# Patient Record
Sex: Male | Born: 1955 | Race: Black or African American | Hispanic: No
Health system: Southern US, Community
[De-identification: ages and names within clinical notes are randomized; demographics above are authoritative.]

## PROBLEM LIST (undated history)

## (undated) ENCOUNTER — Emergency Department: Payer: Medicaid Other

## (undated) DIAGNOSIS — Z951 Presence of aortocoronary bypass graft: Secondary | ICD-10-CM

## (undated) DIAGNOSIS — I509 Heart failure, unspecified: Secondary | ICD-10-CM

## (undated) DIAGNOSIS — I10 Essential (primary) hypertension: Secondary | ICD-10-CM

## (undated) DIAGNOSIS — E119 Type 2 diabetes mellitus without complications: Secondary | ICD-10-CM

## (undated) DIAGNOSIS — I482 Chronic atrial fibrillation, unspecified: Secondary | ICD-10-CM

## (undated) DIAGNOSIS — I2510 Atherosclerotic heart disease of native coronary artery without angina pectoris: Secondary | ICD-10-CM

## (undated) HISTORY — PX: CORONARY ARTERY BYPASS GRAFT: SHX141

## (undated) HISTORY — PX: ANKLE ARTHROSCOPY W/ OPEN REPAIR: SHX1145

---

## 2004-04-02 ENCOUNTER — Other Ambulatory Visit: Payer: Self-pay

## 2004-04-02 ENCOUNTER — Inpatient Hospital Stay: Payer: Self-pay | Admitting: Cardiovascular Disease

## 2006-07-18 ENCOUNTER — Emergency Department: Payer: Self-pay | Admitting: Internal Medicine

## 2006-12-18 ENCOUNTER — Emergency Department: Payer: Self-pay | Admitting: Emergency Medicine

## 2007-02-15 ENCOUNTER — Other Ambulatory Visit: Payer: Self-pay

## 2007-02-15 ENCOUNTER — Emergency Department: Payer: Self-pay | Admitting: Emergency Medicine

## 2008-12-07 ENCOUNTER — Emergency Department: Payer: Self-pay | Admitting: Internal Medicine

## 2009-04-21 ENCOUNTER — Emergency Department: Payer: Self-pay | Admitting: Emergency Medicine

## 2009-06-28 ENCOUNTER — Emergency Department: Payer: Self-pay | Admitting: Emergency Medicine

## 2011-10-02 ENCOUNTER — Observation Stay: Payer: Self-pay | Admitting: Internal Medicine

## 2011-10-02 LAB — CK TOTAL AND CKMB (NOT AT ARMC): CK, Total: 255 U/L — ABNORMAL HIGH (ref 35–232)

## 2011-10-02 LAB — COMPREHENSIVE METABOLIC PANEL
Alkaline Phosphatase: 89 U/L (ref 50–136)
Anion Gap: 9 (ref 7–16)
Calcium, Total: 8.8 mg/dL (ref 8.5–10.1)
Chloride: 109 mmol/L — ABNORMAL HIGH (ref 98–107)
Co2: 23 mmol/L (ref 21–32)
EGFR (Non-African Amer.): >60
Glucose: 128 mg/dL — ABNORMAL HIGH (ref 65–99)
Potassium: 3.3 mmol/L — ABNORMAL LOW (ref 3.5–5.1)

## 2011-10-02 LAB — CBC
HGB: 13.4 g/dL (ref 13.0–18.0)
MCH: 31 pg (ref 26.0–34.0)
MCHC: 32.8 g/dL (ref 32.0–36.0)

## 2011-10-02 LAB — TROPONIN I: Troponin-I: 0.03 ng/mL

## 2011-10-03 LAB — BASIC METABOLIC PANEL
BUN: 8 mg/dL (ref 7–18)
EGFR (Non-African Amer.): >60
Osmolality: 289 (ref 275–301)

## 2011-10-03 LAB — CK TOTAL AND CKMB (NOT AT ARMC): CK, Total: 150 U/L (ref 35–232)

## 2011-10-03 LAB — LIPID PANEL
HDL Cholesterol: 34 mg/dL — ABNORMAL LOW (ref 40–60)
Ldl Cholesterol, Calc: 88 mg/dL (ref 0–100)
Triglycerides: 127 mg/dL (ref 0–200)

## 2013-07-09 ENCOUNTER — Ambulatory Visit: Payer: Self-pay

## 2014-10-06 NOTE — Discharge Summary (Signed)
PATIENT NAME:  Jeremy [name], Jeremy [name] MR#:  [id number] DATE OF BIRTH:  August 15, 1955  DATE OF ADMISSION:  10/02/2011 DATE OF DISCHARGE:  10/03/2011  ADMITTING DIAGNOSIS: Chest pain.  DISCHARGE DIAGNOSES:  1. Chest pain of unclear etiology at this time, cannot rule out unstable angina.  2. History of coronary artery disease with history of coronary artery bypass graft. No history of myocardial infarction. 3. History of hyperlipidemia.  4. Gastroesophageal reflux disease. 5. Hyperglycemia with Hemoglobin A1c of 6.2. 6. Obesity.   DISCHARGE CONDITION: Stable.   DISCHARGE MEDICATIONS:  1. The patient was advised to start aspirin 81 mg p.o. daily.  2. Imdur 30 mg p.o. daily.  3. Lipitor 10 mg p.o. daily.  4. Omeprazole 40 mg p.o. daily.   DIET: 2 grams salt, low fat, low calorie.   DISCHARGE INSTRUCTIONS/FOLLOWUP: The patient was advised to lose weight as the patient can develop diabetes. He is to followup with Dr. Corky Downs in one to three days after discharge.   ACTIVITY LIMITATIONS: As tolerated.  CONSULTANT: Corky Downs, MD  RADIOLOGIC STUDIES: Chest x-ray, PA and lateral, on 10/02/2011, showed atelectasis versus infiltrate in the region of the right middle lobe. Surveillance evaluation was recommended.   CT scan of the chest with IV contrast on 10/02/2011 revealed no evidence of pulmonary or arterial embolic disease. Findings which may represent component of pulmonary edema versus nonedematous interstitial infiltrate, likely atelectasis in the right middle lobe. A mild infiltrate cannot be completely excluded, according to the radiologist.   HISTORY OF PRESENT ILLNESS: The patient is a 59 year old African American male with history of coronary artery disease and history of coronary artery bypass grafting of one artery who presented to the hospital with complaints of chest pain. Please refer to Dr. Theora Gianotti admission 10/02/2011.   On arrival to the hospital, the patient's temperature was  96.2, pulse 64, respiration rate 18, blood pressure 184/88, and saturation was 98% on room air. Physical examination was unremarkable. The patient however had mildly swollen right ankle, but no other abnormalities.   LABS/STUDIES: On 10/02/2011 potassium was 3.3 and glucose 128, otherwise BMP was unremarkable. The patient's liver enzymes were normal. Cardiac enzymes first set showed CK total elevation to 255, otherwise MB fraction as well as troponin levels were within normal limits. CK as well as MB and troponin, second and third sets, were within normal limits. CBC was within normal limits. Coagulation panel was unremarkable.   EKG read by Dr. Boneta Lucks in the emergency room revealed normal sinus rhythm at 66 beats per minute. Some J-point elevation in septal leads, however, the patient had left ventricular hypertrophy. No acute ST-T changes were noted.   HOSPITAL COURSE: The patient was admitted to the hospital. His enzymes were cycled and cardiology consultation with Dr. Corky Downs was obtained. Dr. Juel Burrow saw the patient in consultation, on the day of discharge, 10/03/2011, and felt the patient is stable to be discharged home provided he ambulates in the hospital and he does not have any recurrence of his chest pain. He will be discharged on aspirin as well as Imdur. He is to start Lipitor. He is to follow up with Dr. Juel Burrow in the next few days after discharge for outpatient stress test.   In regards to hyperlipidemia, the patient was to be started on Lipitor.   The patient was complaining of some gastroesophageal reflux disease, so he is going to be started on omeprazole.   The patient was advised to try to lose weight. His Hemoglobin  A1c was checked because of hyperglycemia which was noted on two fasting specimens and Hemoglobin A1c was found to be 6.2. The patient was recommended to lose weight, as mentioned above, and help to improve his blood pressure as well as his glucose levels. The  patient's lipid panel was checked. The patient's LDL was found to be 88, triglyceride level was 127, cholesterol level was 147, and HDL was low at 34.   The patient's blood pressure was looked at all his stay in the hospital time. Initially on arrival to the hospital, the patient's blood pressure was markedly elevated. However, that improved with conservative management alone. It is recommended to follow the patient's blood pressure readings and initiate the patient on blood pressure medications if needed. Meanwhile the patient is to continue Imdur for angina symptoms. He is to follow up with Dr. Juel BurrowMasoud for further recommendations.   TIME SPENT: 40 minutes. ____________________________ Katharina Caperima Long Brimage, MD rv:slb D: 10/03/2011 13:21:56 ET T: 10/04/2011 10:02:51 ET JOB#: [id number]  cc: Katharina Caperima Reese Stockman, MD, <Dictator> Corky DownsJaved Masoud, MD Sharonna Vinje MD ELECTRONICALLY SIGNED 10/05/2011 20:07

## 2014-10-06 NOTE — H&P (Signed)
PATIENT NAME:  CHAIS, FEHRINGER MR#:  [id number] DATE OF BIRTH:  04/22/1956  DATE OF ADMISSION:  10/02/2011  CHIEF COMPLAINT: Chest pain.   HISTORY OF PRESENT ILLNESS: Mr. Parson woke up this morning at 6:30 with a burning/pulling pain in his right chest going down into his right arm. Typically when he gets this type of pain he takes a few TUMS and walks around and it improves. This morning the pain was worse than usual, he rated it an 8/10, and was not relieved with TUMS. He did not have any diaphoresis, nausea, palpitations, presyncope, or syncope. He did say he ate a very large meal last night prior to going to bed. He denies any sour taste in the back of his throat but he usually does have that symptom when he has reflux. Eventually the pain lasted so long that he became worried about his heart and came to the Emergency Department. The pain lasted several hours. He was given 365 mg of aspirin en route to the Emergency Department and the pain was finally decreased down to 1/10 after some IV Protonix.   REVIEW OF SYSTEMS: He denies any fever, weakness, or fatigue. No weight loss or weight gain. No diplopia, blurry vision, eye pain, or floaters. No rhinorrhea, epistaxis, or nasal discharge. No tinnitus or hearing changes. No dysphagia or odynophagia. No wheezing, cough, or dyspnea. No pleuritic chest pain. No tachycardia or palpitations. No orthopnea or PND. No nausea, vomiting, diarrhea, or constipation. No melena, hematochezia, or hematemesis. No dysuria, hematuria, nocturia, or incontinence. No joint pain or joint stiffness. No redness or swelling in any joints though he does have chronic right ankle pain and a small amount of edema there. No rashes, wounds, pruritus, or other skin changes. No headaches, weakness, numbness or tingling. No heat or cold intolerance. No polyuria or polydipsia. No easy bruising, easy bleeding, anemia, or swollen glands.   PAST MEDICAL HISTORY:  1. Coronary artery bypass  grafting in 2005 of a single vessel left main disease. A cardiac catheterization was done here at [facility] and revealed a 90% lesion from the origin of the vessel down through most of the vessel. He is right dominant in terms of blood supply. At that time he was diagnosed with hypertension and hyperlipidemia, though he has been off of all of his medications for just under a year.  2. He does have chronic right ankle pain status post major right ankle surgery.   Primary care doctor, whom he has not seen in almost a year, was at [facility].   FAMILY HISTORY: Significant for father who died of a massive MI at the age of 49. Sister with diabetes. Nephew with diabetes.   SOCIAL HISTORY: He is not working secondary to right ankle pain. He smokes about three cigarettes per day. He drinks one 40-ounce of malt liquor per week. He lives with his girlfriend.   CODE STATUS: He is a FULL CODE.   ALLERGIES: He has no known drug allergies.   MEDICATIONS: He is not taking any medications and does not recall what medications he is supposed to be on.   PHYSICAL EXAMINATION:   VITAL SIGNS: On presentation, temperature 96.2, heart rate 64, respiratory rate 18, blood pressure 184/88, oxygen sat is 98% on room air.   GENERAL: He is well developed, well nourished in no acute distress.   HEENT: Conjunctivae and lids are without erythema or pallor. Pupils are equal, round, and reactive to light and accommodation. Inspection of the ears  and nose without lesions or masses. Lips, teeth, and gums are without lesions or masses. He has poor dentition. Hearing is intact to whispered voice bilaterally. Nasal mucosa, septum, and turbinates are without lesions or masses. Oropharynx is clear. Oral mucosa is moist. Posterior pharynx is free of exudate.   NECK: Supple. There are no masses. Thyroid is not enlarged. There are no nodules.   LUNGS: He is not in respiratory distress. There is no use of accessory muscles or  intercostal retractions. Lungs are clear throughout. There is no wheezing, rales, or rhonchi.   ABDOMEN: Soft, nontender, nondistended. Liver and spleen are not enlarged. He has normoactive bowel sounds.   LYMPH: No cervical or axillary lymphadenopathy.   EXTREMITIES: He has no clubbing or cyanosis. He does have a slightly tender swollen right ankle. No erythema or warmth. It appears postsurgical.   SKIN: No rashes or lesions. Palpation of skin reveals no induration or nodules.   NEUROLOGIC: Cranial nerves II through XII are grossly intact. Deep tendon reflexes 2+ and equal bilaterally. Judgment and insight are intact. The patient is oriented to person, place, and time.   LABORATORY, DIAGNOSTIC, AND RADIOLOGICAL DATA: White blood cell count 8.2, hematocrit 40.8, platelets 262, sodium 143, potassium 3.3, chloride 109, bicarb 23, BUN 10, creatinine 1, glucose 128. CK 255. MB 0.8. Troponin is less than 0.02. INR 0.9.   EKG reveals normal sinus rhythm with a rate of 66. There is some J-point elevation in the septal leads. He appears to have LVH BI voltage. No other acute ST or T wave abnormalities.   Chest x-ray reveals right middle lobe infiltrate versus atelectasis.  ASSESSMENT AND PLAN: This is a 59 year old man with a history of single vessel coronary artery bypass graft for left main disease who presents with atypical chest pain.  1. Chest pain. Will trend cardiac enzymes and optimize his blood pressure. I started him on aspirin 81 mg. I ordered a Cardiology consult since he does not have follow-up and will likely need an outpatient Myoview. Will check fasting lipids and an A1c in the a.m. given his strong family history of diabetes. He is virtually chest pain free at this point. Etiology of his chest pain includes gastroesophageal reflux disease and pulmonary embolism.  2. Hypertension. Will use labetalol for now since he is getting IV contrast. Will avoid ACE inhibitors and diuretics. Would  likely benefit from an ARB/HCTZ combo.  3. Hypokalemia. Will replete and recheck in the morning.  4. Right middle lobe atelectasis versus infiltrate. Given his right leg chronic pain and immobility and right-sided chest pain, will get a CT with PE protocol to rule out pulmonary embolism and further characterize that area of uncertainty.  5. Gastroesophageal reflux disease. B.i.d. PPI. 6. DVT prophylaxis is being given with TEDs and SCDs pending results of the CT scan.   TOTAL TIME SPENT ON CARE AND COORDINATION: 50 minutes.   ____________________________ Lise AuerJennifer L. Manson PasseyBrown, MD jlb:drc D: 10/02/2011 13:12:16 ET T: 10/02/2011 13:52:25 ET JOB#: [id number] cc: Victorino DikeJennifer L. Manson PasseyBrown, MD, <Dictator>  Eber HongJENNIFER L BROWN MD ELECTRONICALLY SIGNED 10/02/2011 21:48

## 2014-10-06 NOTE — Consult Note (Signed)
PATIENT NAME:  Jeremy BottomROWELL, Nymir MR#:  [id number] DATE OF BIRTH:  11-29-1955  DATE OF CONSULTATION:  10/03/2011  REFERRING PHYSICIAN:   CONSULTING PHYSICIAN:  Corky DownsJaved Sashay Felling, MD  HISTORY OF PRESENT ILLNESS: Jeremy BottomMarshall Niblack was admitted in the Evaluation Suite with chest pain, burning pulling type, radiating to the right arm. In the past his [facility] and antacids but this time it lasted for 1-1/2 hours. He said he became a little bit sweaty. No history of nausea or vomiting. He came to the hospital. Serial CPK isoenzymes and EKG ruled out acute myocardial infarction. EKG did not show any acute changes. The patient is known to have left main coronary artery disease and has a history of bypass surgery in 2005. He is not taking any cholesterol medication and did not have any follow-up since then with Cardiology.   PAST SURGICAL HISTORY: History of right ankle surgery.   PAST MEDICAL HISTORY:  1. Hypertension. 2. Dyslipidemia.   FAMILY HISTORY: Father died of massive heart attack at age 255.   SOCIAL HISTORY: The patient works taking care of landscaping. He drinks socially once a week.   PHYSICAL EXAMINATION:   GENERAL: The patient is a well nourished male.   VITAL SIGNS: Blood pressure is found to be 150/80.   HEENT: Head normocephalic. Pupils reactive. Sclerae nonicteric. Tongue is moist, papillated.   NECK: Supple. Jugular venous pressure is not elevated. Carotid upstroke 2+ without any bruit. There is no goiter. No lymphadenopathy. Trachea is central.   CHEST: Clear to auscultation, resonant on percussion.   ABDOMEN: Soft, nontender. Liver and spleen are not enlarged. There is no pedal edema. Right ankle has limited tenderness.   NEUROLOGIC: Normal.  LAB DATA: Hematocrit 40.8. CPKs are normal. Troponin is normal. EKG does not show any acute changes.   IMPRESSION:  1. Angina pectoris.  2. Coronary artery disease.   PLAN: The patient was started on his simvastatin,  aspirin, and Imdur. In addition, he will see me in my office and will do a stress test on him. He was advised to lose weight, walk on a daily basis, and follow a low salt diet. The patient will be discharged on the above medications. He will see me in my office in one week for a stress test.   ____________________________ Corky DownsJaved Kaylub Detienne, MD jm:drc D: 10/03/2011 12:16:32 ET T: 10/03/2011 12:34:05 ET JOB#: [id number]  cc: Corky DownsJaved Josceline Chenard, MD, <Dictator> Corky DownsJAVED Agamjot Kilgallon MD ELECTRONICALLY SIGNED 10/06/2011 17:48

## 2015-02-03 ENCOUNTER — Emergency Department
Admission: EM | Admit: 2015-02-03 | Discharge: 2015-02-03 | Disposition: A | Discharge status: Home / Self Care | Payer: Medicaid Other | Attending: Emergency Medicine | Admitting: Emergency Medicine

## 2015-02-03 ENCOUNTER — Encounter: Payer: Self-pay | Admitting: Emergency Medicine

## 2015-02-03 DIAGNOSIS — H9201 Otalgia, right ear: Secondary | ICD-10-CM | POA: Insufficient documentation

## 2015-02-03 DIAGNOSIS — Z720 Tobacco use: Secondary | ICD-10-CM | POA: Insufficient documentation

## 2015-02-03 DIAGNOSIS — G44209 Tension-type headache, unspecified, not intractable: Secondary | ICD-10-CM | POA: Diagnosis present

## 2015-02-03 DIAGNOSIS — R21 Rash and other nonspecific skin eruption: Secondary | ICD-10-CM | POA: Diagnosis not present

## 2015-02-03 HISTORY — DX: Presence of aortocoronary bypass graft: Z95.1

## 2015-02-03 HISTORY — DX: Atherosclerotic heart disease of native coronary artery without angina pectoris: I25.10

## 2015-02-03 MED ORDER — AMOXICILLIN 500 MG PO TABS: 500.0000 mg | ORAL_TABLET | Freq: Two times a day (BID) | ORAL | Status: DC

## 2015-02-03 MED ORDER — TRAMADOL HCL 50 MG PO TABS: 50.0000 mg | ORAL_TABLET | Freq: Four times a day (QID) | ORAL | Status: DC | PRN

## 2015-02-03 NOTE — ED Notes (Signed)
Pt discharged home after verbalizing understanding of discharge instructions; nad noted. 

## 2015-02-03 NOTE — ED Notes (Signed)
Pt reports pain and tingling in his neck on the right side, as well as a rash on his right ankle. Pt alert & oriented with warm, dry skin.

## 2015-02-03 NOTE — Discharge Instructions (Signed)
Rash A rash is a change in the color or feel of your skin. There are many different types of rashes. You may have other problems along with your rash. HOME CARE  Avoid the thing that caused your rash.  Do not scratch your rash.  You may take cools baths to help stop itching.  Only take medicines as told by your doctor.  Keep all doctor visits as told. GET HELP RIGHT AWAY IF:   Your pain, puffiness (swelling), or redness gets worse.  You have a fever.  You have new or severe problems.  You have body aches, watery poop (diarrhea), or you throw up (vomit).  Your rash is not better after 3 days. MAKE SURE YOU:   Understand these instructions.  Will watch your condition.  Will get help right away if you are not doing well or get worse. Document Released: 11/17/2007 Document Revised: 08/23/2011 Document Reviewed: 03/15/2011 [facility] Patient Information 2015 [place], [place]. This information is not intended to replace advice given to you by your health care provider. Make sure you discuss any questions you have with your health care provider.  Otalgia Otalgia is pain in or around the ear. When the pain is from the ear itself it is called primary otalgia. Pain may also be coming from somewhere else, like the head and neck. This is called secondary otalgia.  CAUSES  Causes of primary otalgia include:  Middle ear infection.  It can also be caused by injury to the ear or infection of the ear canal (swimmer's ear). Swimmer's ear causes pain, swelling and often drainage from the ear canal. Causes of secondary otalgia include:  Sinus infections.  Allergies and colds that cause stuffiness of the nose and tubes that drain the ears (eustachian tubes).  Dental problems like cavities, gum infections or teething.  Sore Throat (tonsillitis and pharyngitis).  Swollen glands in the neck.  Infection of the bone behind the ear (mastoiditis).  TMJ discomfort (problems with the joint  between your jaw and your skull).  Other problems such as nerve disorders, circulation problems, heart disease and tumors of the head and neck can also cause symptoms of ear pain. This is rare. DIAGNOSIS  Evaluation, Diagnosis and Testing:  Examination by your medical caregiver is recommended to evaluate and diagnose the cause of otalgia.  Further testing or referral to a specialist may be indicated if the cause of the ear pain is not found and the symptom persists. TREATMENT   Your doctor may prescribe antibiotics if an ear infection is diagnosed.  Pain relievers and topical analgesics may be recommended.  It is important to take all medications as prescribed. HOME CARE INSTRUCTIONS   It may be helpful to sleep with the painful ear in the up position.  A warm compress over the painful ear may provide relief.  A soft diet and avoiding gum may help while ear pain is present. SEEK IMMEDIATE MEDICAL CARE IF:  You develop severe pain, a high fever, repeated vomiting or dehydration.  You develop extreme dizziness, headache, confusion, ringing in the ears (tinnitus) or hearing loss. Document Released: 07/08/2004 Document Revised: 08/23/2011 Document Reviewed: 04/09/2009 [facility] Patient Information 2015 [place], [place]. This information is not intended to replace advice given to you by your health care provider. Make sure you discuss any questions you have with your health care provider.

## 2015-02-03 NOTE — ED Provider Notes (Signed)
[facility] Emergency Department Provider Note  ____________________________________________  Time seen: On arrival  I have reviewed the triage vital signs and the nursing notes.   HISTORY  Chief Complaint Headache and Rash    HPI Jeremy [name] is a 59 y.o. male who presents with several complaints. Primarily complains of a rash and itching on his right ankle which developed several days ago. He also complains of an intermittent headache on the right side of his head which he has had for many years. And finally he complains of right ear discomfort and wax buildup. He denies fevers chills. No neck pain. No nausea no vomiting.    Past Medical History  Diagnosis Date  . S/P CABG x 1   . Coronary artery disease     There are no active problems to display for this patient.   No past surgical history on file.  Current Outpatient Rx  Name  Route  Sig  Dispense  Refill  . amoxicillin (AMOXIL) 500 MG tablet   Oral   Take 1 tablet (500 mg total) by mouth 2 (two) times daily.   14 tablet   0   . traMADol (ULTRAM) 50 MG tablet   Oral   Take 1 tablet (50 mg total) by mouth every 6 (six) hours as needed.   20 tablet   0     Allergies Review of patient's allergies indicates no known allergies.  No family history on file.  Social History Social History  Substance Use Topics  . Smoking status: Current Every Day Smoker  . Smokeless tobacco: None  . Alcohol Use: 0.6 oz/week    1 Glasses of wine per week    Review of Systems  Constitutional: Negative for fever. Eyes: Negative for visual changes. ENT: Negative for sore throat Genitourinary: Negative for dysuria. Musculoskeletal: Negative for back pain. Skin: Positive for rash Neurological: Negative for focal weakness   ____________________________________________   PHYSICAL EXAM:  VITAL SIGNS: ED Triage Vitals  Enc Vitals Group     BP 02/03/15 1110 147/81 mmHg     Pulse Rate  02/03/15 1110 66     Resp 02/03/15 1110 20     Temp 02/03/15 1110 98.3 F (36.8 C)     Temp Source 02/03/15 1110 Oral     SpO2 02/03/15 1110 94 %     Weight 02/03/15 1110 260 lb (117.935 kg)     Height 02/03/15 1110 5\' 11"  (1.803 m)     Head Cir --      Peak Flow --      Pain Score 02/03/15 1119 3     Pain Loc --      Pain Edu? --      Excl. in GC? --      Constitutional: Alert and oriented. Well appearing and in no distress. Eyes: Conjunctivae are normal.  ENT   Head: Normocephalic and atraumatic.   Mouth/Throat: Mucous membranes are moist. Ears: Cerumen noted but no impaction bilaterally. Right tympanic membrane is dull with possibly some fluid behind it. The pinna is nontender to palpation. No evidence of mastoiditis Cardiovascular: Normal rate, regular rhythm.  Respiratory: Normal respiratory effort without tachypnea nor retractions.  Gastrointestinal: Soft and non-tender in all quadrants. No distention. There is no CVA tenderness. Musculoskeletal: Nontender with normal range of motion in all extremities. Neurologic:  Normal speech and language. No gross focal neurologic deficits are appreciated. Skin:  Skin is warm, dry and intact. Patient with likely insect bites to  the right ankle, no evidence of infection Psychiatric: Mood and affect are normal. Patient exhibits appropriate insight and judgment.  ____________________________________________    LABS (pertinent positives/negatives)  Labs Reviewed - No data to display  ____________________________________________     ____________________________________________    RADIOLOGY I have personally reviewed any xrays that were ordered on this patient: None  ____________________________________________   PROCEDURES  Procedure(s) performed: none   ____________________________________________   INITIAL IMPRESSION / ASSESSMENT AND PLAN / ED COURSE  Pertinent labs & imaging results that were available  during my care of the patient were reviewed by me and considered in my medical decision making (see chart for details).  Patient well-appearing and in no distress. Suspect insect bites or causing itching to the right ankle. He may have a slight ear infection on the right we will prescribe antibiotics and have him follow-up with his primary care physician  ____________________________________________   FINAL CLINICAL IMPRESSION(S) / ED DIAGNOSES  Final diagnoses:  Otalgia of right ear  Tension headache  Rash     Jene Every, MD 02/03/15 [phone]

## 2015-02-03 NOTE — ED Notes (Signed)
PAIN AND TINGLING OVER RIGHT EAR GOING BACK TWORD NECK FOR 1 WEEK.  ALSO HAS RASH ON RIGHT ANKLE.

## 2015-02-25 ENCOUNTER — Encounter: Payer: Self-pay | Admitting: *Deleted

## 2015-02-25 DIAGNOSIS — Z720 Tobacco use: Secondary | ICD-10-CM | POA: Insufficient documentation

## 2015-02-25 DIAGNOSIS — Z7951 Long term (current) use of inhaled steroids: Secondary | ICD-10-CM | POA: Insufficient documentation

## 2015-02-25 DIAGNOSIS — S199XXA Unspecified injury of neck, initial encounter: Secondary | ICD-10-CM | POA: Diagnosis present

## 2015-02-25 DIAGNOSIS — Z792 Long term (current) use of antibiotics: Secondary | ICD-10-CM | POA: Insufficient documentation

## 2015-02-25 DIAGNOSIS — Y92410 Unspecified street and highway as the place of occurrence of the external cause: Secondary | ICD-10-CM | POA: Insufficient documentation

## 2015-02-25 DIAGNOSIS — Y9389 Activity, other specified: Secondary | ICD-10-CM | POA: Insufficient documentation

## 2015-02-25 DIAGNOSIS — L509 Urticaria, unspecified: Secondary | ICD-10-CM | POA: Diagnosis not present

## 2015-02-25 DIAGNOSIS — S161XXA Strain of muscle, fascia and tendon at neck level, initial encounter: Secondary | ICD-10-CM | POA: Diagnosis not present

## 2015-02-25 DIAGNOSIS — Y998 Other external cause status: Secondary | ICD-10-CM | POA: Diagnosis not present

## 2015-02-25 DIAGNOSIS — S0993XA Unspecified injury of face, initial encounter: Secondary | ICD-10-CM | POA: Insufficient documentation
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
## 2015-02-26 ENCOUNTER — Emergency Department: Payer: No Typology Code available for payment source

## 2015-02-26 ENCOUNTER — Emergency Department
Admission: EM | Admit: 2015-02-26 | Discharge: 2015-02-26 | Disposition: A | Discharge status: Home / Self Care | Payer: No Typology Code available for payment source | Attending: Emergency Medicine | Admitting: Emergency Medicine

## 2015-02-26 DIAGNOSIS — L509 Urticaria, unspecified: Secondary | ICD-10-CM

## 2015-02-26 DIAGNOSIS — S161XXA Strain of muscle, fascia and tendon at neck level, initial encounter: Secondary | ICD-10-CM

## 2015-02-26 DIAGNOSIS — R220 Localized swelling, mass and lump, head: Secondary | ICD-10-CM

## 2015-02-26 MED ORDER — PREDNISONE 20 MG PO TABS: 60.0000 mg | ORAL_TABLET | Freq: Every day | ORAL | Status: DC

## 2015-02-26 MED ORDER — FAMOTIDINE 40 MG PO TABS: 40.0000 mg | ORAL_TABLET | Freq: Every evening | ORAL | Status: DC

## 2015-02-26 MED ORDER — PREDNISONE 20 MG PO TABS
60.0000 mg | ORAL_TABLET | Freq: Once | ORAL | Status: AC
  Administered 2015-02-26: 60 mg via ORAL
  Filled 2015-02-26: qty 3

## 2015-02-26 MED ORDER — PREDNISONE 20 MG PO TABS
60.0000 mg | ORAL_TABLET | Freq: Every day | ORAL | Status: DC
Start: 2015-02-26 — End: 2015-02-28

## 2015-02-26 MED ORDER — DIPHENHYDRAMINE HCL 25 MG PO CAPS: 25.0000 mg | ORAL_CAPSULE | ORAL | Status: DC | PRN

## 2015-02-26 MED ORDER — TRAMADOL HCL 50 MG PO TABS
50.0000 mg | ORAL_TABLET | Freq: Four times a day (QID) | ORAL | Status: DC | PRN
Start: 2015-02-26 — End: 2015-02-28

## 2015-02-26 MED ORDER — OXYCODONE-ACETAMINOPHEN 5-325 MG PO TABS
1.0000 | ORAL_TABLET | Freq: Once | ORAL | Status: AC
  Administered 2015-02-26: 1 via ORAL
  Filled 2015-02-26: qty 1

## 2015-02-26 MED ORDER — FAMOTIDINE 20 MG PO TABS
40.0000 mg | ORAL_TABLET | Freq: Once | ORAL | Status: AC
  Administered 2015-02-26: 40 mg via ORAL
  Filled 2015-02-26: qty 2

## 2015-02-26 MED ORDER — DIPHENHYDRAMINE HCL 25 MG PO CAPS
25.0000 mg | ORAL_CAPSULE | Freq: Once | ORAL | Status: AC
  Administered 2015-02-26: 25 mg via ORAL
  Filled 2015-02-26: qty 1
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
## 2015-02-26 NOTE — ED Notes (Signed)

## 2015-02-28 ENCOUNTER — Emergency Department
Admission: EM | Admit: 2015-02-28 | Discharge: 2015-02-28 | Disposition: A | Discharge status: Home / Self Care | Payer: Medicaid Other | Attending: Emergency Medicine | Admitting: Emergency Medicine

## 2015-02-28 ENCOUNTER — Encounter: Payer: Self-pay | Admitting: Emergency Medicine

## 2015-02-28 DIAGNOSIS — Z720 Tobacco use: Secondary | ICD-10-CM | POA: Diagnosis not present

## 2015-02-28 DIAGNOSIS — T360X5A Adverse effect of penicillins, initial encounter: Secondary | ICD-10-CM | POA: Diagnosis not present

## 2015-02-28 DIAGNOSIS — R220 Localized swelling, mass and lump, head: Secondary | ICD-10-CM | POA: Diagnosis present

## 2015-02-28 DIAGNOSIS — T783XXA Angioneurotic edema, initial encounter: Secondary | ICD-10-CM | POA: Diagnosis not present

## 2015-02-28 DIAGNOSIS — I10 Essential (primary) hypertension: Secondary | ICD-10-CM | POA: Insufficient documentation

## 2015-02-28 HISTORY — DX: Essential (primary) hypertension: I10

## 2015-02-28 LAB — COMPREHENSIVE METABOLIC PANEL
ALBUMIN: 3.7 g/dL (ref 3.5–5.0)
ALT: 13 U/L — ABNORMAL LOW (ref 17–63)
AST: 18 U/L (ref 15–41)
Alkaline Phosphatase: 89 U/L (ref 38–126)
Anion gap: 5 (ref 5–15)
BILIRUBIN TOTAL: 0.4 mg/dL (ref 0.3–1.2)
BUN: 10 mg/dL (ref 6–20)
CHLORIDE: 111 mmol/L (ref 101–111)
CO2: 25 mmol/L (ref 22–32)
Calcium: 8.7 mg/dL — ABNORMAL LOW (ref 8.9–10.3)
Creatinine, Ser: 1.15 mg/dL (ref 0.61–1.24)
GFR calc Af Amer: >60 mL/min (ref >60)
GFR calc non Af Amer: >60 mL/min (ref >60)
GLUCOSE: 105 mg/dL — AB (ref 65–99)
POTASSIUM: 3.7 mmol/L (ref 3.5–5.1)
Sodium: 141 mmol/L (ref 135–145)
Total Protein: 6.6 g/dL (ref 6.5–8.1)

## 2015-02-28 LAB — CBC WITH DIFFERENTIAL/PLATELET
BASOS ABS: 0.1 10*3/uL (ref 0–0.1)
BASOS PCT: 1 %
Eosinophils Absolute: 0.4 10*3/uL (ref 0–0.7)
Eosinophils Relative: 8 %
HEMATOCRIT: 45.7 % (ref 40.0–52.0)
Hemoglobin: 15.1 g/dL (ref 13.0–18.0)
Lymphocytes Relative: 35 %
Lymphs Abs: 2 10*3/uL (ref 1.0–3.6)
MCH: 31.8 pg (ref 26.0–34.0)
MCHC: 33.1 g/dL (ref 32.0–36.0)
MCV: 96 fL (ref 80.0–100.0)
MONO ABS: 0.6 10*3/uL (ref 0.2–1.0)
Monocytes Relative: 11 %
NEUTROS ABS: 2.5 10*3/uL (ref 1.4–6.5)
NEUTROS PCT: 45 %
Platelets: 257 10*3/uL (ref 150–440)
RBC: 4.76 MIL/uL (ref 4.40–5.90)
RDW: 14.1 % (ref 11.5–14.5)
WBC: 5.6 10*3/uL (ref 3.8–10.6)

## 2015-02-28 MED ORDER — DIPHENHYDRAMINE HCL 50 MG/ML IJ SOLN
50.0000 mg | Freq: Once | INTRAMUSCULAR | Status: AC
  Administered 2015-02-28: 50 mg via INTRAVENOUS
  Filled 2015-02-28: qty 1

## 2015-02-28 MED ORDER — DIPHENHYDRAMINE HCL 50 MG/ML IJ SOLN
50.0000 mg | Freq: Once | INTRAMUSCULAR | Status: DC
  Filled 2015-02-28: qty 1

## 2015-02-28 MED ORDER — METHYLPREDNISOLONE SODIUM SUCC 125 MG IJ SOLR
125.0000 mg | Freq: Once | INTRAMUSCULAR | Status: AC
  Administered 2015-02-28: 125 mg via INTRAVENOUS
  Filled 2015-02-28: qty 2

## 2015-02-28 MED ORDER — FAMOTIDINE IN NACL 20-0.9 MG/50ML-% IV SOLN
20.0000 mg | Freq: Once | INTRAVENOUS | Status: AC
  Administered 2015-02-28: 20 mg via INTRAVENOUS
  Filled 2015-02-28: qty 50

## 2015-02-28 NOTE — Discharge Instructions (Signed)
Angioedema °Angioedema is a sudden swelling of tissues, often of the skin. It can occur on the face or genitals or in the abdomen or other body parts. The swelling usually develops over a short period and gets better in 24 to 48 hours. It often begins during the night and is found when the person wakes up. The person may also get red, itchy patches of skin (hives). Angioedema can be dangerous if it involves swelling of the air passages.  °Depending on the cause, episodes of angioedema may only happen once, come back in unpredictable patterns, or repeat for several years and then gradually fade away.  °CAUSES  °Angioedema can be caused by an allergic reaction to various triggers. It can also result from nonallergic causes, including reactions to drugs, immune system disorders, viral infections, or an abnormal gene that is passed to you from your parents (hereditary). For some people with angioedema, the cause is unknown.  °Some things that can trigger angioedema include:  °· Foods.   °· Medicines, such as ACE inhibitors, ARBs, nonsteroidal anti-inflammatory agents, or estrogen.   °· Latex.   °· Animal saliva.   °· Insect stings.   °· Dyes used in X-rays.   °· Mild injury.   °· Dental work. °· Surgery. °· Stress.   °· Sudden changes in temperature.   °· Exercise. °SIGNS AND SYMPTOMS  °· Swelling of the skin. °· Hives. If these are present, there is also intense itching. °· Redness in the affected area.   °· Pain in the affected area. °· Swollen lips or tongue. °· Breathing problems. This may happen if the air passages swell. °· Wheezing. °If internal organs are involved, there may be:  °· Nausea.   °· Abdominal pain.   °· Vomiting.   °· Difficulty swallowing.   °· Difficulty passing urine. °DIAGNOSIS  °· Your health care provider will examine the affected area and take a medical and family history. °· Various tests may be done to help determine the cause. Tests may include: °¨ Allergy skin tests to see if the problem  is an allergic reaction.   °¨ Blood tests to check for hereditary angioedema.   °¨ Tests to check for underlying diseases that could cause the condition.   °· A review of your medicines, including over-the-counter medicines, may be done. °TREATMENT  °Treatment will depend on the cause of the angioedema. Possible treatments include:  °· Removal of anything that triggered the condition (such as stopping certain medicines).   °· Medicines to treat symptoms or prevent attacks. Medicines given may include:   °¨ Antihistamines.   °¨ Epinephrine injection.   °¨ Steroids.   °· Hospitalization may be required for severe attacks. If the air passages are affected, it can be an emergency. Tubes may need to be placed to keep the airway open. °HOME CARE INSTRUCTIONS  °· Take all medicines as directed by your health care provider. °· If you were given medicines for emergency allergy treatment, always carry them with you. °· Wear a medical bracelet as directed by your health care provider.   °· Avoid known triggers. °SEEK MEDICAL CARE IF:  °· You have repeat attacks of angioedema.   °· Your attacks are more frequent or more severe despite preventive measures.   °· You have hereditary angioedema and are considering having children. It is important to discuss with your health care provider the risks of passing the condition on to your children. °SEEK IMMEDIATE MEDICAL CARE IF:  °· You have severe swelling of the mouth, tongue, or lips. °· You have difficulty breathing.   °· You have difficulty swallowing.   °· You faint. °MAKE   SURE YOU: °· Understand these instructions. °· Will watch your condition. °· Will get help right away if you are not doing well or get worse. °Document Released: 08/09/2001 Document Revised: 10/15/2013 Document Reviewed: 01/22/2013 °[employer]® Patient Information ©2015 [employer]. This information is not intended to replace advice given to you by your health care provider. Make sure you discuss any questions  you have with your health care provider. ° °

## 2015-02-28 NOTE — ED Notes (Signed)
Pt to ED with facial & periorbital edema and rash on back of neck and behind ears.
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
## 2015-10-05 ENCOUNTER — Encounter: Payer: Self-pay | Admitting: *Deleted

## 2015-10-05 ENCOUNTER — Emergency Department
Admission: EM | Admit: 2015-10-05 | Discharge: 2015-10-05 | Disposition: A | Discharge status: Home / Self Care | Payer: Medicaid Other | Attending: Emergency Medicine | Admitting: Emergency Medicine

## 2015-10-05 DIAGNOSIS — I2510 Atherosclerotic heart disease of native coronary artery without angina pectoris: Secondary | ICD-10-CM | POA: Insufficient documentation

## 2015-10-05 DIAGNOSIS — I10 Essential (primary) hypertension: Secondary | ICD-10-CM | POA: Diagnosis not present

## 2015-10-05 DIAGNOSIS — B86 Scabies: Secondary | ICD-10-CM | POA: Diagnosis not present

## 2015-10-05 DIAGNOSIS — R21 Rash and other nonspecific skin eruption: Secondary | ICD-10-CM | POA: Diagnosis not present

## 2015-10-05 DIAGNOSIS — F17210 Nicotine dependence, cigarettes, uncomplicated: Secondary | ICD-10-CM | POA: Insufficient documentation

## 2015-10-05 DIAGNOSIS — Z951 Presence of aortocoronary bypass graft: Secondary | ICD-10-CM | POA: Diagnosis not present

## 2015-10-05 MED ORDER — PERMETHRIN 5 % EX CREA: TOPICAL_CREAM | CUTANEOUS | Status: DC

## 2015-10-05 MED ORDER — METHYLPREDNISOLONE SODIUM SUCC 125 MG IJ SOLR
80.0000 mg | Freq: Once | INTRAMUSCULAR | Status: AC
  Administered 2015-10-05: 80 mg via INTRAVENOUS
  Filled 2015-10-05: qty 2

## 2015-10-05 MED ORDER — HYDROXYZINE PAMOATE 25 MG PO CAPS: 25.0000 mg | ORAL_CAPSULE | Freq: Three times a day (TID) | ORAL | Status: DC | PRN

## 2015-10-05 MED ORDER — PREDNISONE 20 MG PO TABS: ORAL_TABLET | ORAL | Status: DC

## 2015-10-05 MED ORDER — DIPHENHYDRAMINE HCL 50 MG/ML IJ SOLN
25.0000 mg | Freq: Once | INTRAMUSCULAR | Status: AC
  Administered 2015-10-05: 25 mg via INTRAVENOUS
  Filled 2015-10-05: qty 1

## 2015-10-05 NOTE — ED Provider Notes (Signed)
[facility] Emergency Department Provider Note  ____________________________________________  Time seen: Approximately 1:52 PM  I have reviewed the triage vital signs and the nursing notes.   HISTORY  Chief Complaint Rash    HPI Temple Sporer is a 60 y.o. male , NAD, reports the emergency department with 1 month history of full body rash. States that he acquired this rash from his dog who has a similar rash.  Dog is an indoor dog but does go outside. Patient believes he has small bugs crawling and biting his head. Denies any other sensation anywhere else on his body. Has noted small clear pustules about his body which she scratches. The rash is intensely itchy. Has been taking Benadryl without alleviation. His wife accompanies him and she denies any similar rash. Denies fever, chills, body aches. Has not had any shortness breath, chest pain, wheezing. No abdominal pain, nausea, vomiting. No known environmental allergies.   Past Medical History  Diagnosis Date  . S/P CABG x 1   . Coronary artery disease   . Hypertension     There are no active problems to display for this patient.   Past Surgical History  Procedure Laterality Date  . Ankle arthroscopy w/ open repair      Current Outpatient Rx  Name  Route  Sig  Dispense  Refill  . hydrOXYzine (VISTARIL) 25 MG capsule   Oral   Take 1 capsule (25 mg total) by mouth 3 (three) times daily as needed.   30 capsule   0   . permethrin (ELIMITE) 5 % cream      Apply generously to external skin from the neck down to the soles of the feet prior to going to bed. Sleep in the solution and wash off 8 hours later. May reapply in 10 days if symptoms persist or return.   60 g   1   . predniSONE (DELTASONE) 20 MG tablet      Take 2 tablets by mouth, once daily, for 5 days   10 tablet   0     Allergies Amoxicillin  No family history on file.  Social History Social History  Substance Use Topics  .  Smoking status: Current Some Day Smoker -- 0.50 packs/day    Types: Cigarettes  . Smokeless tobacco: None  . Alcohol Use: 0.6 oz/week    1 Cans of beer per week     Review of Systems  Constitutional: No fever/chills, fatigue Eyes: No visual changes. No discharge, redness, discharge ENT: No sore throat, tongue swelling, throat swelling. Cardiovascular: No chest pain. Respiratory: No cough. No shortness of breath. No wheezing.  Gastrointestinal: No abdominal pain.  No nausea, vomiting.  Musculoskeletal: Negative for general myalgias.  Skin: Positive for diffuse, puritic rash. Some open sores due to itching.  Neurological: Negative for headaches, focal weakness or numbness. 10-point ROS otherwise negative.  ____________________________________________   PHYSICAL EXAM:  VITAL SIGNS: ED Triage Vitals  Enc Vitals Group     BP 10/05/15 1317 141/68 mmHg     Pulse Rate 10/05/15 1317 84     Resp 10/05/15 1317 18     Temp 10/05/15 1317 97.9 F (36.6 C)     Temp Source 10/05/15 1317 Oral     SpO2 10/05/15 1317 97 %     Weight 10/05/15 1317 260 lb (117.935 kg)     Height 10/05/15 1317  (1.803 m)     Head Cir --      Peak  Flow --      Pain Score --      Pain Loc --      Pain Edu? --      Excl. in GC? --      Constitutional: Alert and oriented. Well appearing and in no acute distress.  Head: Atraumatic. ENT:        Mouth/Throat: Mucous membranes are moist. No swelling noted.  Neck: No stridor. Supple with FROM Hematological/Lymphatic/Immunilogical: No cervical lymphadenopathy. Cardiovascular: Normal rate, regular rhythm. Normal S1 and S2.  Good peripheral circulation with 2+ pulses noted in bilateral lower extremities.  Respiratory: Normal respiratory effort without tachypnea or retractions. Lungs CTAB. Musculoskeletal: Diffuse swelling of bilateral lower extremities without edema or tenderness present.  Neurologic:  Normal speech and language. No gross focal neurologic  deficits are appreciated.  Skin:  Pruritic, erythematous tests, maculopapular rash diffusely across the patient's body from head to toe. Some areas of excoriation where the patient has been scratching. No crusting or oozing or weeping lesions are noted. No areas of induration or fluctuance. No evidence of tonsils or tracking marks from a possible infestation. Skin is warm, dry and intact.  Psychiatric: Mood and affect are normal. Speech and behavior are normal. Patient exhibits appropriate insight and judgement.   ____________________________________________   LABS  None ____________________________________________  EKG  None ____________________________________________  RADIOLOGY  None ____________________________________________    PROCEDURES  Procedure(s) performed: None    Medications  methylPREDNISolone sodium succinate (SOLU-MEDROL) 125 mg/2 mL injection 80 mg (80 mg Intravenous Given 10/05/15 1410)  diphenhydrAMINE (BENADRYL) injection 25 mg (25 mg Intravenous Given 10/05/15 1411)     ____________________________________________   INITIAL IMPRESSION / ASSESSMENT AND PLAN / ED COURSE  Patient's diagnosis is consistent with rash and nonspecific skin eruption with potential animal transmitted scabies. Patient will be discharged home with prescriptions for hydroxyzine, permethrin cream and prednisone to take as directed. Advised the patient that he should have his animal treated for potential mange as soon as possible. Home care instructions were given in regards to cleansing of clothing, bedding and other household items that may have been exposed. Patient is to follow up with [facility] if symptoms persist past this treatment course. Patient is given ED precautions to return to the ED for any worsening or new symptoms.      ____________________________________________  FINAL CLINICAL IMPRESSION(S) / ED DIAGNOSES  Final diagnoses:  Rash and nonspecific  skin eruption  Animal-transmitted scabies      NEW MEDICATIONS STARTED DURING THIS VISIT:  Discharge Medication List as of 10/05/2015  2:15 PM    START taking these medications   Details  hydrOXYzine (VISTARIL) 25 MG capsule Take 1 capsule (25 mg total) by mouth 3 (three) times daily as needed., Starting 10/05/2015, Until Discontinued, Print    permethrin (ELIMITE) 5 % cream Apply generously to external skin from the neck down to the soles of the feet prior to going to bed. Sleep in the solution and wash off 8 hours later. May reapply in 10 days if symptoms persist or return., Print    predniSONE (DELTASONE) 20 MG tablet Take 2 tablets by mouth, once daily, for 5 days, Print             Hope PigeonJami L Riot Barrick, PA-C 10/05/15 1453  Myrna Blazeravid Matthew Schaevitz, MD 10/05/15 1623

## 2015-10-05 NOTE — ED Notes (Signed)
Patient c/o rash on all extremities for one month.

## 2015-10-05 NOTE — ED Notes (Signed)
NAD noted at time of D/C. Pt denies questions or concerns. Pt ambulatory to the lobby at this time.  

## 2015-10-05 NOTE — Discharge Instructions (Signed)

## 2015-10-23 ENCOUNTER — Emergency Department
Admission: EM | Admit: 2015-10-23 | Discharge: 2015-10-23 | Disposition: A | Discharge status: Home / Self Care | Payer: Medicaid Other | Attending: Emergency Medicine | Admitting: Emergency Medicine

## 2015-10-23 ENCOUNTER — Encounter: Payer: Self-pay | Admitting: Emergency Medicine

## 2015-10-23 DIAGNOSIS — I10 Essential (primary) hypertension: Secondary | ICD-10-CM | POA: Diagnosis not present

## 2015-10-23 DIAGNOSIS — L209 Atopic dermatitis, unspecified: Secondary | ICD-10-CM | POA: Insufficient documentation

## 2015-10-23 DIAGNOSIS — F17210 Nicotine dependence, cigarettes, uncomplicated: Secondary | ICD-10-CM | POA: Diagnosis not present

## 2015-10-23 DIAGNOSIS — I2510 Atherosclerotic heart disease of native coronary artery without angina pectoris: Secondary | ICD-10-CM | POA: Insufficient documentation

## 2015-10-23 DIAGNOSIS — R21 Rash and other nonspecific skin eruption: Secondary | ICD-10-CM | POA: Diagnosis present

## 2015-10-23 DIAGNOSIS — B86 Scabies: Secondary | ICD-10-CM | POA: Diagnosis not present

## 2015-10-23 MED ORDER — PREDNISONE 10 MG PO TABS: ORAL_TABLET | ORAL | Status: DC

## 2015-10-23 MED ORDER — HYDROXYZINE PAMOATE 25 MG PO CAPS: 25.0000 mg | ORAL_CAPSULE | Freq: Three times a day (TID) | ORAL | Status: DC | PRN

## 2015-10-23 MED ORDER — PERMETHRIN 5 % EX CREA: 1.0000 "application " | TOPICAL_CREAM | Freq: Once | CUTANEOUS | Status: DC

## 2015-10-23 NOTE — Discharge Instructions (Signed)
Scabies, Adult Scabies is a skin condition that happens when very small insects get under the skin (infestation). This causes a rash and severe itchiness. Scabies can spread from person to person (is contagious). If you get scabies, it is common for others in your household to get scabies too. With proper treatment, symptoms usually go away in 2-4 weeks. Scabies usually does not cause lasting problems. CAUSES This condition is caused by mites (Sarcoptes scabiei, or human itch mites) that can only be seen with a microscope. The mites get into the top layer of skin and lay eggs. Scabies can spread from person to person through:  Close contact with a person who has scabies.  Contact with infested items, such as towels, bedding, or clothing. RISK FACTORS This condition is more likely to develop in:  People who live in nursing homes and other extended-care facilities.  People who have sexual contact with a partner who has scabies.  Young children who attend child care facilities.  People who care for others who are at increased risk for scabies. SYMPTOMS Symptoms of this condition may include:  Severe itchiness. This is often worse at night.  A rash that includes tiny red bumps or blisters. The rash commonly occurs on the wrist, elbow, armpit, fingers, waist, groin, or buttocks. Bumps may form a line (burrow) in some areas.  Skin irritation. This can include scaly patches or sores. DIAGNOSIS This condition is diagnosed with a physical exam. Your health care provider will look closely at your skin. In some cases, your health care provider may take a sample of your affected skin (skin scraping) and have it examined under a microscope. TREATMENT This condition may be treated with:  Medicated cream or lotion that kills the mites. This is spread on the entire body and left on for several hours. Usually, one treatment with medicated cream or lotion is enough to kill all of the mites. In severe  cases, the treatment may be repeated.  Medicated cream that relieves itching.  Medicines that help to relieve itching.  Medicines that kill the mites. This treatment is rarely used. HOME CARE INSTRUCTIONS Medicines  Take or apply over-the-counter and prescription medicines as told by your health care provider.  Apply medicated cream or lotion as told by your health care provider.  Do not wash off the medicated cream or lotion until the necessary amount of time has passed. Skin Care  Avoid scratching your affected skin.  Keep your fingernails closely trimmed to reduce injury from scratching.  Take cool baths or apply cool washcloths to help reduce itching. General Instructions  Clean all items that you recently had contact with, including bedding, clothing, and furniture. Do this on the same day that your treatment starts.  Use hot water when you wash items.  Place unwashable items into closed, airtight plastic bags for at least 3 days. The mites cannot live for more than 3 days away from human skin.  Vacuum furniture and mattresses that you use.  Make sure that other people who may have been infested are examined by a health care provider. These include members of your household and anyone who may have had contact with infested items.  Keep all follow-up visits as told by your health care provider. This is important. SEEK MEDICAL CARE IF:  You have itching that does not go away after 4 weeks of treatment.  You continue to develop new bumps or burrows.  You have redness, swelling, or pain in your rash area  after treatment.  You have fluid, blood, or pus coming from your rash.   This information is not intended to replace advice given to you by your health care provider. Make sure you discuss any questions you have with your health care provider.   Document Released: 02/19/2015 Document Reviewed: 12/31/2014 Elsevier Interactive Patient Education 2016 Elsevier  Inc.  Eczema Eczema, also called atopic dermatitis, is a skin disorder that causes inflammation of the skin. It causes a red rash and dry, scaly skin. The skin becomes very itchy. Eczema is generally worse during the cooler winter months and often improves with the warmth of summer. Eczema usually starts showing signs in infancy. Some children outgrow eczema, but it may last through adulthood.  CAUSES  The exact cause of eczema is not known, but it appears to run in families. People with eczema often have a family history of eczema, allergies, asthma, or hay fever. Eczema is not contagious. Flare-ups of the condition may be caused by:   Contact with something you are sensitive or allergic to.   Stress. SIGNS AND SYMPTOMS  Dry, scaly skin.   Red, itchy rash.   Itchiness. This may occur before the skin rash and may be very intense.  DIAGNOSIS  The diagnosis of eczema is usually made based on symptoms and medical history. TREATMENT  Eczema cannot be cured, but symptoms usually can be controlled with treatment and other strategies. A treatment plan might include:  Controlling the itching and scratching.   Use over-the-counter antihistamines as directed for itching. This is especially useful at night when the itching tends to be worse.   Use over-the-counter steroid creams as directed for itching.   Avoid scratching. Scratching makes the rash and itching worse. It may also result in a skin infection (impetigo) due to a break in the skin caused by scratching.   Keeping the skin well moisturized with creams every day. This will seal in moisture and help prevent dryness. Lotions that contain alcohol and water should be avoided because they can dry the skin.   Limiting exposure to things that you are sensitive or allergic to (allergens).   Recognizing situations that cause stress.   Developing a plan to manage stress.  HOME CARE INSTRUCTIONS   Only take over-the-counter or  prescription medicines as directed by your health care provider.   Do not use anything on the skin without checking with your health care provider.   Keep baths or showers short (5 minutes) in warm (not hot) water. Use mild cleansers for bathing. These should be unscented. You may add nonperfumed bath oil to the bath water. It is best to avoid soap and bubble bath.   Immediately after a bath or shower, when the skin is still damp, apply a moisturizing ointment to the entire body. This ointment should be a petroleum ointment. This will seal in moisture and help prevent dryness. The thicker the ointment, the better. These should be unscented.   Keep fingernails cut short. Children with eczema may need to wear soft gloves or mittens at night after applying an ointment.   Dress in clothes made of cotton or cotton blends. Dress lightly, because heat increases itching.   A child with eczema should stay away from anyone with fever blisters or cold sores. The virus that causes fever blisters (herpes simplex) can cause a serious skin infection in children with eczema. SEEK MEDICAL CARE IF:   Your itching interferes with sleep.   Your rash gets worse or  is not better within 1 week after starting treatment.   You see pus or soft yellow scabs in the rash area.   You have a fever.   You have a rash flare-up after contact with someone who has fever blisters.    This information is not intended to replace advice given to you by your health care provider. Make sure you discuss any questions you have with your health care provider.   Document Released: 05/28/2000 Document Revised: 03/21/2013 Document Reviewed: 01/01/2013 Elsevier Interactive Patient Education [employer]2016 Elsevier Inc.

## 2015-10-23 NOTE — ED Notes (Signed)
Pt presents with rash to all over body. Was seen here 4/23 and dx with scabies. States he needs more of the cream for this rash.

## 2015-10-23 NOTE — ED Provider Notes (Signed)
[facility] Emergency Department Provider Note  ____________________________________________  Time seen: Approximately 7:10 PM  I have reviewed the triage vital signs and the nursing notes.   HISTORY  Chief Complaint Rash    HPI Jeremy [name] is a 60 y.o. male presents for evaluation with 1+ month history of body rash. He was seen here approximately 4 weeks ago for the same thing patient states that the rash is intensely itchy and taking Benadryl with no radiation. States that his previous medication did help however he feels like he got reexposed. Patient reports lots of itching and its opening some scabs.   Past Medical History  Diagnosis Date  . S/P CABG x 1   . Coronary artery disease   . Hypertension     There are no active problems to display for this patient.   Past Surgical History  Procedure Laterality Date  . Ankle arthroscopy w/ open repair      Current Outpatient Rx  Name  Route  Sig  Dispense  Refill  . hydrOXYzine (VISTARIL) 25 MG capsule   Oral   Take 1 capsule (25 mg total) by mouth 3 (three) times daily as needed.   30 capsule   0   . permethrin (ELIMITE) 5 % cream   Topical   Apply 1 application topically once.   120 g   0   . predniSONE (DELTASONE) 10 MG tablet      Take 6 tablets daily for 2 days, then 5 tablets daily for 2 days, and decrease by 1 tablet daily   42 tablet   0     Allergies Amoxicillin  No family history on file.  Social History Social History  Substance Use Topics  . Smoking status: Current Some Day Smoker -- 0.50 packs/day    Types: Cigarettes  . Smokeless tobacco: None  . Alcohol Use: 0.6 oz/week    1 Cans of beer per week    Review of Systems Constitutional: No fever/chills Eyes: No visual changes. ENT: No sore throat. Cardiovascular: Denies chest pain. Respiratory: Denies shortness of breath. Gastrointestinal: No abdominal pain.  No nausea, no vomiting.  No diarrhea.  No  constipation. Genitourinary: Negative for dysuria. Musculoskeletal: Negative for back pain. Skin: Positive for diffuse pruritic rash. Some sores due to itching. Neurological: Negative for headaches, focal weakness or numbness.  10-point ROS otherwise negative.  ____________________________________________   PHYSICAL EXAM:  VITAL SIGNS: ED Triage Vitals  Enc Vitals Group     BP 10/23/15 1854 157/71 mmHg     Pulse Rate 10/23/15 1854 91     Resp 10/23/15 1854 18     Temp 10/23/15 1854 98.7 F (37.1 C)     Temp Source 10/23/15 1854 Oral     SpO2 10/23/15 1854 98 %     Weight 10/23/15 1854 260 lb (117.935 kg)     Height 10/23/15 1854  (1.803 m)     Head Cir --      Peak Flow --      Pain Score --      Pain Loc --      Pain Edu? --      Excl. in GC? --     Constitutional: Alert and oriented. Well appearing and in no acute distress. Neurologic:  Normal speech and language. No gross focal neurologic deficits are appreciated. No gait instability. Skin:  Pruritic erythematous rash maculopapular. Diffusely located across about admitting total both arms strong back and legs. Increased areas  of excoriation from scratching. No crusting or oozing of weeping lesions are noted. No areas of induration. No welts. Psychiatric: Mood and affect are normal. Speech and behavior are normal.  ____________________________________________   LABS (all labs ordered are listed, but only abnormal results are displayed)  Labs Reviewed - No data to display ____________________________________________   PROCEDURES  Procedure(s) performed: None  Critical Care performed: No  ____________________________________________   INITIAL IMPRESSION / ASSESSMENT AND PLAN / ED COURSE  Pertinent labs & imaging results that were available during my care of the patient were reviewed by me and considered in my medical decision making (see chart for details).  Acute exacerbation scabies/excema. Patient  was given a prescription for Elimite cream to apply as directed, tapering dose of prednisone as well as Vistaril for itching. Patient follow-up with PCP patient was referred to both medicine and dermatology and [place] [facility]. ____________________________________________   FINAL CLINICAL IMPRESSION(S) / ED DIAGNOSES  Final diagnoses:  Crusted scabies  Atopic dermatitis     This chart was dictated using voice recognition software/Dragon. Despite best efforts to proofread, errors can occur which can change the meaning. Any change was purely unintentional.   Evangeline Dakinharles M Beers, PA-C 10/23/15 1927  Jene Everyobert Kinner, MD 10/23/15 2028

## 2016-02-12 ENCOUNTER — Emergency Department: Payer: Medicaid Other

## 2016-02-12 ENCOUNTER — Emergency Department
Admission: EM | Admit: 2016-02-12 | Discharge: 2016-02-12 | Disposition: A | Discharge status: Home / Self Care | Payer: Medicaid Other | Attending: Emergency Medicine | Admitting: Emergency Medicine

## 2016-02-12 ENCOUNTER — Encounter: Payer: Self-pay | Admitting: *Deleted

## 2016-02-12 DIAGNOSIS — I10 Essential (primary) hypertension: Secondary | ICD-10-CM | POA: Insufficient documentation

## 2016-02-12 DIAGNOSIS — I2510 Atherosclerotic heart disease of native coronary artery without angina pectoris: Secondary | ICD-10-CM | POA: Diagnosis not present

## 2016-02-12 DIAGNOSIS — L03115 Cellulitis of right lower limb: Secondary | ICD-10-CM | POA: Diagnosis not present

## 2016-02-12 DIAGNOSIS — F17210 Nicotine dependence, cigarettes, uncomplicated: Secondary | ICD-10-CM | POA: Diagnosis not present

## 2016-02-12 DIAGNOSIS — M25571 Pain in right ankle and joints of right foot: Secondary | ICD-10-CM | POA: Diagnosis present

## 2016-02-12 MED ORDER — SULFAMETHOXAZOLE-TRIMETHOPRIM 800-160 MG PO TABS: 1.0000 | ORAL_TABLET | Freq: Two times a day (BID) | ORAL | 0 refills | Status: DC

## 2016-02-12 MED ORDER — OXYCODONE-ACETAMINOPHEN 5-325 MG PO TABS: 1.0000 | ORAL_TABLET | Freq: Four times a day (QID) | ORAL | 0 refills | Status: DC | PRN

## 2016-02-12 NOTE — ED Provider Notes (Signed)
[facility] Emergency Department Provider Note  ____________________________________________  Time seen: Approximately 7:00 PM  I have reviewed the triage vital signs and the nursing notes.   HISTORY  Chief Complaint Ankle Pain    HPI Jeremy [name] is a 60 y.o. male who presents emergency department complaining of right ankle pain. Patient reports that he fractured his ankle that required surgical repair 2 years ago. Patient states that the initial surgery one of the screws was "too long" and put a "hole" through the lateral aspect of his ankle. Patient reports that area has been prone to infection with Center skin in that periodically it opens up and drained pus. Patient is requesting antibiotics for this area. Patient also reports that he was taking care of his lawn 2 days ago when he rolled his ankle. Patient was concerned that he may have masses hardware up or down further damage. Patient complains of right ankle pain but no other pain. No other complaints at this time. He is tried over-the-counter medications with no relief prior to arrival.   Past Medical History:  Diagnosis Date  . Coronary artery disease   . Hypertension   . S/P CABG x 1     There are no active problems to display for this patient.   Past Surgical History:  Procedure Laterality Date  . ANKLE ARTHROSCOPY W/ OPEN REPAIR      Prior to Admission medications   Medication Sig Start Date End Date Taking? Authorizing Provider  hydrOXYzine (VISTARIL) 25 MG capsule Take 1 capsule (25 mg total) by mouth 3 (three) times daily as needed. 10/23/15   Evangeline Dakin, PA-C  oxyCODONE-acetaminophen (ROXICET) 5-325 MG tablet Take 1 tablet by mouth every 6 (six) hours as needed for severe pain. 02/12/16   Delorise Royals Jasean Ambrosia, PA-C  permethrin (ELIMITE) 5 % cream Apply 1 application topically once. 10/23/15   Evangeline Dakin, PA-C  predniSONE (DELTASONE) 10 MG tablet Take 6 tablets daily for 2 days,  then 5 tablets daily for 2 days, and decrease by 1 tablet daily 10/23/15   Evangeline Dakin, PA-C  sulfamethoxazole-trimethoprim (BACTRIM DS,SEPTRA DS) 800-160 MG tablet Take 1 tablet by mouth 2 (two) times daily. 02/12/16   Delorise Royals Wayne Brunker, PA-C    Allergies Amoxicillin  No family history on file.  Social History Social History  Substance Use Topics  . Smoking status: Current Some Day Smoker    Packs/day: 0.50    Types: Cigarettes  . Smokeless tobacco: Never Used  . Alcohol use 0.6 oz/week    1 Cans of beer per week     Review of Systems  Constitutional: No fever/chills Cardiovascular: no chest pain. Respiratory: no cough. No SOB. Musculoskeletal: Positive for right ankle pain. Skin: Positive for using lesion to the right lateral ankle. Neurological: Negative for headaches, focal weakness or numbness. 10-point ROS otherwise negative.  ____________________________________________   PHYSICAL EXAM:  VITAL SIGNS: ED Triage Vitals  Enc Vitals Group     BP 02/12/16 1802 (!) 141/63     Pulse Rate 02/12/16 1802 64     Resp 02/12/16 1802 20     Temp 02/12/16 1802 98.4 F (36.9 C)     Temp Source 02/12/16 1802 Oral     SpO2 02/12/16 1802 97 %     Weight 02/12/16 1804 200 lb (90.7 kg)     Height 02/12/16 1804 5\' 11"  (1.803 m)     Head Circumference --      Peak Flow --  Pain Score 02/12/16 1805 10     Pain Loc --      Pain Edu? --      Excl. in GC? --      Constitutional: Alert and oriented. Well appearing and in no acute distress. Eyes: Conjunctivae are normal. PERRL. EOMI. Head: Atraumatic. Cardiovascular: Normal rate, regular rhythm. Normal S1 and S2.  Good peripheral circulation. Respiratory: Normal respiratory effort without tachypnea or retractions. Lungs CTAB. Good air entry to the bases with no decreased or absent breath sounds. Musculoskeletal: Edema is noted to the right ankle. This appears to be chronic in nature. Limited range of motion due to  hardware as well as pain. Patient does have an open lesion to the right lateral ankle. See note below. Patient denies any tenderness to palpation over the osseous structures of the ankle. Hardware is palpable. Otherwise, no palpable abnormality. Dorsalis pedis pulse intact. Sensation intact 5 digits. Neurologic:  Normal speech and language. No gross focal neurologic deficits are appreciated.  Skin:  Skin is warm, dry and intact. No rash noted. Small, 0.5 cm open sores noted to the right lateral ankle. This appears to be a remote surgical site that now has infection overlying. Minimal pustular drainage. No surrounding erythema or edema. No fluctuance with palpation Psychiatric: Mood and affect are normal. Speech and behavior are normal. Patient exhibits appropriate insight and judgement.   ____________________________________________   LABS (all labs ordered are listed, but only abnormal results are displayed)  Labs Reviewed - No data to display ____________________________________________  EKG   ____________________________________________  RADIOLOGY Festus Barren Alaura Schippers, personally viewed and evaluated these images (plain radiographs) as part of my medical decision making, as well as reviewing the written report by the radiologist.  Dg Ankle Complete Right  Result Date: 02/12/2016 CLINICAL DATA:  Acute right ankle injury and pain 2 days ago. Initial encounter. History of prior ankle surgery in 2013. EXAM: RIGHT ANKLE - COMPLETE 3+ VIEW COMPARISON:  07/09/2013 radiographs FINDINGS: No acute fracture or dislocation identified. Remote ankle injury noted. Plate and screw fixation with adjacent lucency along the distal fibula and across the tibia fibular syndesmosis again noted. The second most distal screw is fractured but unchanged. Severe degenerative changes within the ankle are again noted. A calcaneal spur is present. IMPRESSION: No evidence of acute abnormality. Remote injury, surgical  hardware as described and severe degenerative changes within the right ankle. Electronically Signed   By: Harmon Pier M.D.   On: 02/12/2016 18:42    ____________________________________________    PROCEDURES  Procedure(s) performed:    Procedures    Medications - No data to display   ____________________________________________   INITIAL IMPRESSION / ASSESSMENT AND PLAN / ED COURSE  Pertinent labs & imaging results that were available during my care of the patient were reviewed by me and considered in my medical decision making (see chart for details).  Review of the [place] CSRS was performed in accordance of the [facility] prior to dispensing any controlled drugs.  Clinical Course    Patient's diagnosis is consistent with Acute on chronic ankle pain. This appears to be a sprained ankle and top of a chronic arthralgia. Patient also has a skin lesion to the lateral aspect of the ankle. This appears to be a rub area was used that has now become infected. This is overlying a previous surgical incision. No indication for abscess at this time.. Patient will be discharged home with prescriptions for antibiotics and pain medication. Patient has a primary  care appointment in 2 weeks which she is encouraged to keep her follow-up.. . Patient is given ED precautions to return to the ED for any worsening or new symptoms.     ____________________________________________  FINAL CLINICAL IMPRESSION(S) / ED DIAGNOSES  Final diagnoses:  Right ankle pain  Cellulitis of right ankle      NEW MEDICATIONS STARTED DURING THIS VISIT:  New Prescriptions   OXYCODONE-ACETAMINOPHEN (ROXICET) 5-325 MG TABLET    Take 1 tablet by mouth every 6 (six) hours as needed for severe pain.   SULFAMETHOXAZOLE-TRIMETHOPRIM (BACTRIM DS,SEPTRA DS) 800-160 MG TABLET    Take 1 tablet by mouth 2 (two) times daily.        This chart was dictated using voice recognition software/Dragon. Despite best efforts to  proofread, errors can occur which can change the meaning. Any change was purely unintentional.    Racheal PatchesJonathan D Makilah Dowda, PA-C 02/12/16 1912    Rockne MenghiniAnne-Caroline Norman, MD 02/12/16 2000

## 2016-02-12 NOTE — ED Notes (Signed)
Pt in via triage with complaints of right ankle pain and swelling x 3 days.  Pt reports surgery to that ankle in 2013, states approximately one year ago, "screw pushed through the skin."  Pt reports ankle healed after without giving him any difficulty.  Pt now presents with swelling and tenderness to that ankle, open sore with serosanguinous drainage laterally.  Pt A/Ox4, no immediate distress noted.

## 2016-02-12 NOTE — ED Notes (Signed)
Discharge instructions reviewed with patient. Questions fielded by this RN. Patient verbalizes understanding of instructions. Patient discharged home in stable condition per Cuthriell PA . No acute distress noted at time of discharge.   

## 2016-02-12 NOTE — ED Triage Notes (Signed)
Pt to triage via wheelchair.  Pt was weed eating 2 days ago and twisted right ankle.  Pt has swelling and drainage from ankle.  Hx of old fx to right ankle.

## 2016-04-08 ENCOUNTER — Emergency Department
Admission: EM | Admit: 2016-04-08 | Discharge: 2016-04-08 | Disposition: A | Discharge status: Home / Self Care | Payer: Medicaid Other | Attending: Emergency Medicine | Admitting: Emergency Medicine

## 2016-04-08 ENCOUNTER — Encounter: Payer: Self-pay | Admitting: Emergency Medicine

## 2016-04-08 DIAGNOSIS — L03115 Cellulitis of right lower limb: Secondary | ICD-10-CM | POA: Insufficient documentation

## 2016-04-08 DIAGNOSIS — B86 Scabies: Secondary | ICD-10-CM | POA: Insufficient documentation

## 2016-04-08 DIAGNOSIS — Z7951 Long term (current) use of inhaled steroids: Secondary | ICD-10-CM | POA: Diagnosis not present

## 2016-04-08 DIAGNOSIS — F17210 Nicotine dependence, cigarettes, uncomplicated: Secondary | ICD-10-CM | POA: Diagnosis not present

## 2016-04-08 DIAGNOSIS — I10 Essential (primary) hypertension: Secondary | ICD-10-CM | POA: Insufficient documentation

## 2016-04-08 DIAGNOSIS — Z951 Presence of aortocoronary bypass graft: Secondary | ICD-10-CM | POA: Insufficient documentation

## 2016-04-08 DIAGNOSIS — I2510 Atherosclerotic heart disease of native coronary artery without angina pectoris: Secondary | ICD-10-CM | POA: Diagnosis not present

## 2016-04-08 MED ORDER — OXYCODONE-ACETAMINOPHEN 5-325 MG PO TABS: 1.0000 | ORAL_TABLET | Freq: Four times a day (QID) | ORAL | 0 refills | Status: DC | PRN

## 2016-04-08 MED ORDER — OXYCODONE-ACETAMINOPHEN 5-325 MG PO TABS
1.0000 | ORAL_TABLET | Freq: Once | ORAL | Status: AC
  Administered 2016-04-08: 1 via ORAL
  Filled 2016-04-08: qty 1

## 2016-04-08 MED ORDER — SULFAMETHOXAZOLE-TRIMETHOPRIM 800-160 MG PO TABS: 2.0000 | ORAL_TABLET | Freq: Two times a day (BID) | ORAL | 0 refills | Status: DC

## 2016-04-08 MED ORDER — PERMETHRIN 5 % EX CREA: TOPICAL_CREAM | CUTANEOUS | 1 refills | Status: AC

## 2016-04-08 MED ORDER — SULFAMETHOXAZOLE-TRIMETHOPRIM 800-160 MG PO TABS
2.0000 | ORAL_TABLET | Freq: Once | ORAL | Status: AC
  Administered 2016-04-08: 2 via ORAL
  Filled 2016-04-08: qty 2

## 2016-04-08 NOTE — ED Provider Notes (Signed)
[facility] Emergency Department Provider Note   ____________________________________________   First MD Initiated Contact with Patient 04/08/16 [phone]     (approximate)  I have reviewed the triage vital signs and the nursing notes.   HISTORY  Chief Complaint Cellulitis   HPI Jeremy [name] is a 60 y.o. male with a history of hypertension and coronary artery disease was presenting emergency department today with right lower extremity cellulitis. He says that over the past week he has had right lower extremity swelling as well as pus from his right lateral ankle. He says that he was taking his dog to the veterinarian several days ago when a doctor in the waiting room offered to write of antibiotics for his ankle. The patient has been taking Keflex he says for the past 3-4 days and the swelling has improved as well as the pus has stopped. However, he says that there is still a clear drainage from his right lateral ankle and he thinks he may need additional antibiotic. He is also just said medication for pain control because he says his pain is a 10 out of 10 at this time it is not relieved by ibuprofen. He says that he also has hardware in his right ankle and that a screw fell out of his right lateral ankle one year ago.He denies any fever or chills.\  Patient is also requesting refill of his permethrin cream. He says that he thinks he has a recurrence of his scabies and has multiple bites to his torso as well as bilateral upper and lower extremities.   Past Medical History:  Diagnosis Date  . Coronary artery disease   . Hypertension   . S/P CABG x 1     There are no active problems to display for this patient.   Past Surgical History:  Procedure Laterality Date  . ANKLE ARTHROSCOPY W/ OPEN REPAIR      Prior to Admission medications   Medication Sig Start Date End Date Taking? Authorizing Provider  hydrOXYzine (VISTARIL) 25 MG capsule Take 1 capsule (25  mg total) by mouth 3 (three) times daily as needed. 10/23/15   Evangeline Dakinharles M Beers, PA-C  oxyCODONE-acetaminophen (ROXICET) 5-325 MG tablet Take 1 tablet by mouth every 6 (six) hours as needed for severe pain. 02/12/16   Delorise RoyalsJonathan D Cuthriell, PA-C  permethrin (ELIMITE) 5 % cream Apply 1 application topically once. 10/23/15   Evangeline Dakinharles M Beers, PA-C  predniSONE (DELTASONE) 10 MG tablet Take 6 tablets daily for 2 days, then 5 tablets daily for 2 days, and decrease by 1 tablet daily 10/23/15   Evangeline Dakinharles M Beers, PA-C  sulfamethoxazole-trimethoprim (BACTRIM DS,SEPTRA DS) 800-160 MG tablet Take 1 tablet by mouth 2 (two) times daily. 02/12/16   Delorise RoyalsJonathan D Cuthriell, PA-C    Allergies Amoxicillin  No family history on file.  Social History Social History  Substance Use Topics  . Smoking status: Current Some Day Smoker    Packs/day: 0.50    Types: Cigarettes  . Smokeless tobacco: Never Used  . Alcohol use 0.6 oz/week    1 Cans of beer per week    Review of Systems Constitutional: No fever/chills Eyes: No visual changes. ENT: No sore throat. Cardiovascular: Denies chest pain. Respiratory: Denies shortness of breath. Gastrointestinal: No abdominal pain.  No nausea, no vomiting.  No diarrhea.  No constipation. Genitourinary: Negative for dysuria. Musculoskeletal: Negative for back pain. Skin: Negative for rash. Neurological: Negative for headaches, focal weakness or numbness.  10-point ROS otherwise  negative.  ____________________________________________   PHYSICAL EXAM:  VITAL SIGNS: ED Triage Vitals  Enc Vitals Group     BP 04/08/16 1908 (!) 212/88     Pulse Rate 04/08/16 1908 89     Resp 04/08/16 1908 18     Temp 04/08/16 1908 97.8 F (36.6 C)     Temp Source 04/08/16 1908 Oral     SpO2 04/08/16 1908 98 %     Weight 04/08/16 1906 256 lb (116.1 kg)     Height 04/08/16 1906 5\' 11"  (1.803 m)     Head Circumference --      Peak Flow --      Pain Score 04/08/16 1907 10     Pain Loc --       Pain Edu? --      Excl. in GC? --     Constitutional: Alert and oriented. Well appearing and in no acute distress. Eyes: Conjunctivae are normal. PERRL. EOMI. Head: Atraumatic. Nose: No congestion/rhinnorhea. Mouth/Throat: Mucous membranes are moist.   Neck: No stridor.   Cardiovascular: Normal rate, regular rhythm. Grossly normal heart sounds.  Good peripheral circulation. Respiratory: Normal respiratory effort.  No retractions. Lungs CTAB. Gastrointestinal: Soft and nontender. No distention.  Musculoskeletal: Right lower extremity with moderate edema compared to the left lower extremity where there is only minimal edema. The right lower extremity is mildly tender to palpation especially about the right ankle. The right lateral ankle has an ulcerated wound just superior to the lateral malleolus. There is no pus from the wound but there is a small amount of excoriation around this wound. There is no visualized hardware or bone. There is no crepitus. The patient is able to a bili with a normal gait, unassisted. Pain is not out of proportion to exam. Negative for induration. Bilateral and equal dorsalis pedis pulses and sensation is intact  No joint ef to light touch distally.fusions. Neurologic:  Normal speech and language. No gross focal neurologic deficits are appreciated. No gait instability. Skin:  bilateral upper lobe and lower extremities as well as trunk with multiple excoriations as well as what appear to be punctate insect bite marks. Psychiatric: Mood and affect are normal. Speech and behavior are normal.  ____________________________________________   LABS (all labs ordered are listed, but only abnormal results are displayed)  Labs Reviewed - No data to display ____________________________________________  EKG   ____________________________________________  RADIOLOGY   ____________________________________________   PROCEDURES  Procedure(s) performed:    Procedures  Critical Care performed:   ____________________________________________   INITIAL IMPRESSION / ASSESSMENT AND PLAN / ED COURSE  Pertinent labs & imaging results that were available during my care of the patient were reviewed by me and considered in my medical decision making (see chart for details).  Patient is refusing any blood work, x-rays or IV antibiotics. He says that he would just like an additional antibiotic as well as pain control medication and a refill of his permethrin cream. He says that he has been improving on his outpatient Keflex over the past 3-4 days. He is nontoxic in appearance with normal vital signs. I'll be adding Bactrim DS as well as discharge with Percocet. I will also refill his permethrin. I gave him strict return precautions to come back for any worsening or concerning symptoms. I specifically discussed with him the risks of having infected hardware as well as bone in his right ankle and how this could lead to serious infection. He is understanding willing to comply. He has  decisional capacity and good insight into his medical problems. He'll be following up with orthopedics as well as primary care at the [facility] clinic.  Uncontrolled hypertension, possibly related to pain.   Clinical Course     ____________________________________________   FINAL CLINICAL IMPRESSION(S) / ED DIAGNOSES  Right lower extremity cellulitis. Scabies.     NEW MEDICATIONS STARTED DURING THIS VISIT:  New Prescriptions   No medications on file     Note:  This document was prepared using Dragon voice recognition software and may include unintentional dictation errors.    Myrna Blazer, MD 04/08/16 1946

## 2016-04-08 NOTE — ED Triage Notes (Addendum)
Pt to triage via w/c with no distress noted; st "something has been nibbling on my ankle"; st prev surgery with hardware; has seen PCP and was rx keflex 10/19 but has not completed it like rx "because I forgot about it"; draining wound noted to right lateral ankle; pt pulls out bottle of vistaril which he st "this helps more than anything, but I really just need this cream"; pt indicates tube of permethrin cream

## 2016-08-08 ENCOUNTER — Emergency Department
Admission: EM | Admit: 2016-08-08 | Discharge: 2016-08-08 | Disposition: A | Discharge status: Home / Self Care | Payer: Medicaid Other | Attending: Emergency Medicine | Admitting: Emergency Medicine

## 2016-08-08 DIAGNOSIS — I10 Essential (primary) hypertension: Secondary | ICD-10-CM | POA: Insufficient documentation

## 2016-08-08 DIAGNOSIS — K0889 Other specified disorders of teeth and supporting structures: Secondary | ICD-10-CM | POA: Diagnosis present

## 2016-08-08 DIAGNOSIS — K0381 Cracked tooth: Secondary | ICD-10-CM | POA: Insufficient documentation

## 2016-08-08 DIAGNOSIS — I2510 Atherosclerotic heart disease of native coronary artery without angina pectoris: Secondary | ICD-10-CM | POA: Diagnosis not present

## 2016-08-08 DIAGNOSIS — S025XXB Fracture of tooth (traumatic), initial encounter for open fracture: Secondary | ICD-10-CM

## 2016-08-08 DIAGNOSIS — Z79899 Other long term (current) drug therapy: Secondary | ICD-10-CM | POA: Insufficient documentation

## 2016-08-08 DIAGNOSIS — F17210 Nicotine dependence, cigarettes, uncomplicated: Secondary | ICD-10-CM | POA: Insufficient documentation

## 2016-08-08 DIAGNOSIS — K029 Dental caries, unspecified: Secondary | ICD-10-CM

## 2016-08-08 MED ORDER — CLINDAMYCIN HCL 300 MG PO CAPS: 300.0000 mg | ORAL_CAPSULE | Freq: Three times a day (TID) | ORAL | 0 refills | Status: AC

## 2016-08-08 MED ORDER — CLINDAMYCIN HCL 150 MG PO CAPS
300.0000 mg | ORAL_CAPSULE | Freq: Once | ORAL | Status: AC
  Administered 2016-08-08: 300 mg via ORAL
  Filled 2016-08-08: qty 2

## 2016-08-08 MED ORDER — LIDOCAINE VISCOUS 2 % MT SOLN
15.0000 mL | Freq: Once | OROMUCOSAL | Status: AC
  Administered 2016-08-08: 15 mL via OROMUCOSAL
  Filled 2016-08-08: qty 15

## 2016-08-08 MED ORDER — TRAMADOL HCL 50 MG PO TABS
50.0000 mg | ORAL_TABLET | Freq: Once | ORAL | Status: AC
  Administered 2016-08-08: 50 mg via ORAL
  Filled 2016-08-08: qty 1

## 2016-08-08 NOTE — ED Provider Notes (Signed)
[facility] Emergency Department Provider Note ____________________________________________  Time seen: 0856  I have reviewed the triage vital signs and the nursing notes.  HISTORY  Chief Complaint  Dental Pain  HPI Jeremy [name] is a 61 y.o. male resents to the ED for a patient of dental pain. Patient with poor dentition admittedly, describes pain to the left lower molar. Denies any interim fevers, chills, sweats. He describes pain in temperature sensitivity to the left lower molar.  Past Medical History:  Diagnosis Date  . Coronary artery disease   . Hypertension   . S/P CABG x 1     There are no active problems to display for this patient.  Past Surgical History:  Procedure Laterality Date  . ANKLE ARTHROSCOPY W/ OPEN REPAIR      Prior to Admission medications   Medication Sig Start Date End Date Taking? Authorizing Provider  clindamycin (CLEOCIN) 300 MG capsule Take 1 capsule (300 mg total) by mouth 3 (three) times daily. 08/08/16 08/18/16  Shey Yott V Bacon Thanh Pomerleau, PA-C  hydrOXYzine (VISTARIL) 25 MG capsule Take 1 capsule (25 mg total) by mouth 3 (three) times daily as needed. 10/23/15   Evangeline Dakinharles M Beers, PA-C  oxyCODONE-acetaminophen (ROXICET) 5-325 MG tablet Take 1-2 tablets by mouth every 6 (six) hours as needed. 04/08/16   Myrna Blazeravid Matthew Schaevitz, MD  permethrin (ELIMITE) 5 % cream Apply to entire body, wash off after 8-14 hours, THEN repeat in 1 week 04/08/16 04/08/17  Myrna Blazeravid Matthew Schaevitz, MD  predniSONE (DELTASONE) 10 MG tablet Take 6 tablets daily for 2 days, then 5 tablets daily for 2 days, and decrease by 1 tablet daily 10/23/15   Evangeline Dakinharles M Beers, PA-C  sulfamethoxazole-trimethoprim (BACTRIM DS,SEPTRA DS) 800-160 MG tablet Take 2 tablets by mouth 2 (two) times daily. 04/08/16   Myrna Blazeravid Matthew Schaevitz, MD    Allergies Amoxicillin  No family history on file.  Social History Social History  Substance Use Topics  . Smoking status: Current  Some Day Smoker    Packs/day: 0.50    Types: Cigarettes  . Smokeless tobacco: Never Used  . Alcohol use 0.6 oz/week    1 Cans of beer per week    Review of Systems  Constitutional: Negative for fever. Eyes: Negative for visual changes. ENT: Negative for sore throat. Dental pain as above. Cardiovascular: Negative for chest pain. Respiratory: Negative for shortness of breath. Gastrointestinal: Negative for abdominal pain, vomiting and diarrhea. Neurological: Negative for headaches, focal weakness or numbness. ____________________________________________  PHYSICAL EXAM:  VITAL SIGNS: ED Triage Vitals [08/08/16 0846]  Enc Vitals Group     BP (!) 171/84     Pulse Rate 62     Resp 18     Temp 97.8 F (36.6 C)     Temp Source Oral     SpO2 96 %     Weight      Height      Head Circumference      Peak Flow      Pain Score 8     Pain Loc      Pain Edu?      Excl. in GC?    Constitutional: Alert and oriented. Well appearing and in no distress. Head: Normocephalic and atraumatic. Eyes: Conjunctivae are normal. PERRL. Normal extraocular movements Ears: Canals clear. TMs intact bilaterally. Nose: No congestion/rhinorrhea/epistaxis. Mouth/Throat: Mucous membranes are moist. Uvula is midline and tonsils are flat. No brawny submandibular swelling is appreciated. Patient with multiple chronically fractured and decayed teeth in  the upper jaw. The left lower dry reveals a single molar with buccal side erosion at the gumline. No focal swelling, abscess formation, pointing, or fluctuance is appreciated. Neck: Supple. No thyromegaly. Hematological/Lymphatic/Immunological: No cervical lymphadenopathy. Cardiovascular: Normal rate, regular rhythm. Normal distal pulses. Respiratory: Normal respiratory effort. No wheezes/rales/rhonchi. Skin:  Skin is warm, dry and intact. No rash noted.  Psychiatric: Mood and affect are normal. Patient exhibits appropriate insight and  judgment. ____________________________________________  PROCEDURES  Viscous Lido 2% gargle Ultram 50 mg PO Clindamycin 300 mg PO ____________________________________________  INITIAL IMPRESSION / ASSESSMENT AND PLAN / ED COURSE  Patient with acute dental pain secondary to chronic dental caries and dental erosion. No obvious abscesses appreciated at this time. He is discharged with a  Prescription for clindamycin to dose as directed. He was further referred to [facility] dental for definitive management. Return to the ED for acutely worsening symptoms. ____________________________________________  FINAL CLINICAL IMPRESSION(S) / ED DIAGNOSES  Final diagnoses:  Pain due to dental caries  Open fracture of tooth, initial encounter      Lissa Hoard, PA-C 08/08/16 0930    Myrna Blazer, MD 08/08/16 [phone]

## 2016-08-08 NOTE — ED Triage Notes (Signed)
Pt c/o toothache, when asked which tooth bothered him "all of them"

## 2016-08-08 NOTE — Discharge Instructions (Signed)
You have a cavity which has caused dental pain. You are being treated for a dental infection. You should follow-up with [facility] for your tooth to be repaired or pulled. Rinse with warm-salty water after every meal. Use a soft toothbrush after every meal. Take the antibiotic as directed, until all pills are gone. See one of the dental providers listed below:  OPTIONS FOR DENTAL FOLLOW UP CARE  [place] Department of Health and Human Services - Local Safety Net Dental Clinics TripDoors.comhttp://www.ncdhhs.gov/dph/oralhealth/services/safetynetclinics.htm   [facility] [phone])  [facility] [phone])  [facility] [phone]  [facility] [phone]  [facility] [phone]) This clinic caters to the indigent population and is on a lottery system. Location: [employer] [facility] [facility] [employer] [facility], [address] [address], [address], [place] Clinic Hours: Wednesdays from 6pm - 9pm, patients seen by a lottery system. For dates, call or go to [email or web address] Services: Cleanings, fillings and simple extractions. Payment Options: DENTAL WORK IS FREE OF CHARGE. Bring proof of income or support. Best way to get seen: Arrive at 5:15 pm - this is a lottery, NOT first come/first serve, so arriving earlier will not increase your chances of being seen.     [facility] [phone] Select option 1 for emergencies   Location: [facility], [address], [address], [place] Clinic Hours: No walk-ins accepted - call the day before to schedule an appointment. Check in times are 9:30 am and 1:30 pm. Services: Simple extractions, temporary fillings, pulpectomy/pulp debridement, uncomplicated abscess drainage. Payment Options: PAYMENT IS DUE AT THE TIME OF SERVICE.  Fee is usually $100-200, additional surgical procedures (e.g. abscess drainage) may be extra. Cash,  checks, Visa/MasterCard accepted.  Can file Medicaid if patient is covered for dental - patient should call case worker to check. No discount for [facility] patients. Best way to get seen: MUST call the day before and get onto the schedule. Can usually be seen the next 1-2 days. No walk-ins accepted.     [facility] [phone]   Location: [facility], [address], [place] Clinic Hours: M, W, Th, F 8am or 1:30pm, Tues 9a or 1:30 - first come/first served. Services: Simple extractions, temporary fillings, uncomplicated abscess drainage.  You do not need to be an [place] resident. Payment Options: PAYMENT IS DUE AT THE TIME OF SERVICE. Dental insurance, otherwise sliding scale - bring proof of income or support. Depending on income and treatment needed, cost is usually $50-200. Best way to get seen: Arrive early as it is first come/first served.     [facility] [phone]   Location: [address] Clinic Hours: Mon-Thu 8a-5p Services: Most basic dental services including extractions and fillings. Payment Options: PAYMENT IS DUE AT THE TIME OF SERVICE. Sliding scale, up to 50% off - bring proof if income or support. Medicaid with dental option accepted. Best way to get seen: Call to schedule an appointment, can usually be seen within 2 weeks OR they will try to see walk-ins - show up at 8a or 2p (you may have to wait).     [facility] [phone] [place] RESIDENTS ONLY   Location: [facility], [address], [place], [place] [id number] Clinic Hours: By appointment only. Monday - Thursday 8am-5pm, Friday 8am-12pm Services: Cleanings, fillings, extractions. Payment Options: PAYMENT IS DUE AT THE TIME OF SERVICE. Cash, ProsperityVisa or  MasterCard. Sliding scale - $30 minimum per service. Best way to get seen: Come in to office, complete  packet and make an appointment - need proof of income or support monies for each household member and proof of [facility] residence. Usually takes about a month to get in.     [facility] [phone]   Location: [address], [facility] Hours: Walk-in Urgent Care Dental Services are offered Monday-Friday mornings only. The numbers of emergencies accepted daily is limited to the number of providers available. Maximum 15 - Mondays, Wednesdays & Thursdays Maximum 10 - Tuesdays & Fridays Services: You do not need to be a [facility] resident to be seen for a dental emergency. Emergencies are defined as pain, swelling, abnormal bleeding, or dental trauma. Walkins will receive x-rays if needed. NOTE: Dental cleaning is not an emergency. Payment Options: PAYMENT IS DUE AT THE TIME OF SERVICE. Minimum co-pay is $40.00 for uninsured patients. Minimum co-pay is $3.00 for Medicaid with dental coverage. Dental Insurance is accepted and must be presented at time of visit. Medicare does not cover dental. Forms of payment: Cash, credit card, checks. Best way to get seen: If not previously registered with the clinic, walk-in dental registration begins at 7:15 am and is on a first come/first serve basis. If previously registered with the clinic, call to make an appointment.     [facility] [phone] [place] RESIDENTS ONLY   Location: [address] [place], [place] Clinic Hours: Mon-Thu 10a-2p Services: Extractions only! Payment Options: FREE (donations accepted) - bring proof of income or support Best way to get seen: Call and schedule an appointment OR come at 8am on the 1st Monday of every month (except for holidays) when it is first come/first served.     [facility] [phone]   Location: [address] [place], [place] Clinic Hours: Friday mornings Services, Payment Options, Best way to get seen: Call for info

## 2016-12-25 ENCOUNTER — Encounter: Payer: Self-pay | Admitting: Emergency Medicine

## 2016-12-25 ENCOUNTER — Emergency Department
Admission: EM | Admit: 2016-12-25 | Discharge: 2016-12-25 | Disposition: A | Discharge status: Home / Self Care | Payer: Medicaid Other | Attending: Emergency Medicine | Admitting: Emergency Medicine

## 2016-12-25 DIAGNOSIS — F17210 Nicotine dependence, cigarettes, uncomplicated: Secondary | ICD-10-CM | POA: Insufficient documentation

## 2016-12-25 DIAGNOSIS — L98491 Non-pressure chronic ulcer of skin of other sites limited to breakdown of skin: Secondary | ICD-10-CM | POA: Diagnosis not present

## 2016-12-25 DIAGNOSIS — R21 Rash and other nonspecific skin eruption: Secondary | ICD-10-CM | POA: Insufficient documentation

## 2016-12-25 DIAGNOSIS — I10 Essential (primary) hypertension: Secondary | ICD-10-CM | POA: Diagnosis not present

## 2016-12-25 LAB — CBC WITH DIFFERENTIAL/PLATELET
BASOS ABS: 0.1 10*3/uL (ref 0–0.1)
Basophils Relative: 1 %
Eosinophils Absolute: 0.4 10*3/uL (ref 0–0.7)
Eosinophils Relative: 5 %
HEMATOCRIT: 44.5 % (ref 40.0–52.0)
Hemoglobin: 15.1 g/dL (ref 13.0–18.0)
LYMPHS ABS: 2.1 10*3/uL (ref 1.0–3.6)
Lymphocytes Relative: 28 %
MCH: 31.7 pg (ref 26.0–34.0)
MCHC: 34 g/dL (ref 32.0–36.0)
MCV: 93.2 fL (ref 80.0–100.0)
MONO ABS: 1 10*3/uL (ref 0.2–1.0)
MONOS PCT: 13 %
NEUTROS ABS: 4 10*3/uL (ref 1.4–6.5)
Neutrophils Relative %: 53 %
Platelets: 282 10*3/uL (ref 150–440)
RBC: 4.77 MIL/uL (ref 4.40–5.90)
RDW: 14.6 % — AB (ref 11.5–14.5)
WBC: 7.6 10*3/uL (ref 3.8–10.6)

## 2016-12-25 LAB — COMPREHENSIVE METABOLIC PANEL
ALT: 8 U/L — ABNORMAL LOW (ref 17–63)
AST: 16 U/L (ref 15–41)
Albumin: 3.6 g/dL (ref 3.5–5.0)
Alkaline Phosphatase: 90 U/L (ref 38–126)
Anion gap: 8 (ref 5–15)
BILIRUBIN TOTAL: 0.5 mg/dL (ref 0.3–1.2)
BUN: 15 mg/dL (ref 6–20)
CO2: 21 mmol/L — ABNORMAL LOW (ref 22–32)
Calcium: 9.1 mg/dL (ref 8.9–10.3)
Chloride: 110 mmol/L (ref 101–111)
Creatinine, Ser: 1.17 mg/dL (ref 0.61–1.24)
GFR calc Af Amer: >60 mL/min (ref >60)
Glucose, Bld: 134 mg/dL — ABNORMAL HIGH (ref 65–99)
POTASSIUM: 3.8 mmol/L (ref 3.5–5.1)
Sodium: 139 mmol/L (ref 135–145)
TOTAL PROTEIN: 7.1 g/dL (ref 6.5–8.1)

## 2016-12-25 MED ORDER — DIPHENHYDRAMINE HCL 50 MG/ML IJ SOLN
INTRAMUSCULAR | Status: AC
  Filled 2016-12-25: qty 1

## 2016-12-25 MED ORDER — DIPHENHYDRAMINE HCL 50 MG/ML IJ SOLN
50.0000 mg | Freq: Once | INTRAMUSCULAR | Status: AC
  Administered 2016-12-25: 50 mg via INTRAVENOUS

## 2016-12-25 MED ORDER — METHYLPREDNISOLONE SODIUM SUCC 125 MG IJ SOLR
125.0000 mg | Freq: Once | INTRAMUSCULAR | Status: AC
  Administered 2016-12-25: 125 mg via INTRAVENOUS

## 2016-12-25 MED ORDER — PERMETHRIN 1 % EX LOTN: TOPICAL_LOTION | CUTANEOUS | 2 refills | Status: DC

## 2016-12-25 MED ORDER — METHYLPREDNISOLONE SODIUM SUCC 125 MG IJ SOLR
INTRAMUSCULAR | Status: AC
  Filled 2016-12-25: qty 2

## 2016-12-25 MED ORDER — PREDNISONE 10 MG (21) PO TBPK: ORAL_TABLET | Freq: Every day | ORAL | 0 refills | Status: DC

## 2016-12-25 MED ORDER — BACITRACIN ZINC 500 UNIT/GM EX OINT
TOPICAL_OINTMENT | Freq: Two times a day (BID) | CUTANEOUS | Status: DC
  Administered 2016-12-25: 1 via TOPICAL
  Filled 2016-12-25: qty 1.8

## 2016-12-25 MED ORDER — IBUPROFEN 600 MG PO TABS: 600.0000 mg | ORAL_TABLET | Freq: Three times a day (TID) | ORAL | 0 refills | Status: DC | PRN

## 2016-12-25 MED ORDER — MUPIROCIN 2 % EX OINT: TOPICAL_OINTMENT | CUTANEOUS | 0 refills | Status: DC

## 2016-12-25 NOTE — ED Notes (Signed)
Patient standing in hallway swinging arms.  Patient has been instructed by several staff members that he needs to stay in room due to face if he has scabies it is easily spread.  Patient contuining to be in hall way.  Office Duty [employer] Officer Dewaine CongerBarker to speak with patient.

## 2016-12-25 NOTE — ED Notes (Signed)
Pt reports improved facial swelling and itching. No resp distress noted.

## 2016-12-25 NOTE — ED Triage Notes (Signed)
Pt c/o getting bit everywhere starting 2 days ago. Reports same as when had scabies before. Bumps noted to generalized body.  Pt has significant swelling to right foot/ankle.  Redness noted that appears to be from skin breakdown.  Draining clear fluid from ankle/sock wet with drainage.  Denies fevers.  Woke up this morning and face was also swollen. Denies dyspnea or dysphagia. handling secretions in triage.

## 2016-12-25 NOTE — ED Notes (Signed)
Right ankle rewrapped and ointment applied. Discharge papers reviewed with patient and family but pt is frustrated with MD discission not to prescribe pain medication and pt refused to listen to discharge instructions fully. Pt ambulatory independently upon discharge and in NAD.

## 2016-12-25 NOTE — ED Provider Notes (Signed)
[facility] Emergency Department Provider Note       Time seen: ----------------------------------------- 10:39 PM on 12/25/2016 -----------------------------------------     I have reviewed the triage vital signs and the nursing notes.   HISTORY   Chief Complaint Allergic Reaction and Leg Problem    HPI Jeremy [name] is a 61 y.o. male who presents to the ED for a rash that he has had for sometime to his entire body. Patient reports itching and reports that he has had scabies before. Again he was noted to have a diffuse rash to his body with swelling around the right foot and ankle. Patient was noted to have an abrasion around the right ankle with drainage of clear fluid. Patient denies dyspnea or dysphagia. He denies fevers, chills or other complaints. His main complaint is pain medicine for his right ankle.   Past Medical History:  Diagnosis Date  . Coronary artery disease   . Hypertension   . S/P CABG x 1     There are no active problems to display for this patient.   Past Surgical History:  Procedure Laterality Date  . ANKLE ARTHROSCOPY W/ OPEN REPAIR      Allergies Amoxicillin  Social History Social History  Substance Use Topics  . Smoking status: Current Some Day Smoker    Packs/day: 0.50    Types: Cigarettes  . Smokeless tobacco: Never Used  . Alcohol use 0.6 oz/week    1 Cans of beer per week    Review of Systems Constitutional: Negative for fever. Cardiovascular: Negative for chest pain. Respiratory: Negative for shortness of breath. Gastrointestinal: Negative for abdominal pain, vomiting and diarrhea. Genitourinary: Negative for dysuria. Musculoskeletal: Positive for right ankle pain Skin: Positive for diffuse rash Neurological: Negative for headaches, focal weakness or numbness.  All systems negative/normal/unremarkable except as stated in the HPI  ____________________________________________   PHYSICAL  EXAM:  VITAL SIGNS: ED Triage Vitals  Enc Vitals Group     BP 12/25/16 1652 138/77     Pulse Rate 12/25/16 1652 78     Resp 12/25/16 1652 20     Temp 12/25/16 1652 97.9 F (36.6 C)     Temp Source 12/25/16 1652 Oral     SpO2 12/25/16 1654 99 %     Weight 12/25/16 1654 240 lb (108.9 kg)     Height 12/25/16 1654 5\' 11"  (1.803 m)     Head Circumference --      Peak Flow --      Pain Score 12/25/16 1651 10     Pain Loc --      Pain Edu? --      Excl. in GC? --     Constitutional: Alert and oriented. Well appearing and in no distress. Eyes: Conjunctivae are normal. Normal extraocular movements. ENT   Head: Normocephalic and atraumatic.   Nose: No congestion/rhinnorhea.   Mouth/Throat: Mucous membranes are moist.   Neck: No stridor. Cardiovascular: Normal rate, regular rhythm. No murmurs, rubs, or gallops. Respiratory: Normal respiratory effort without tachypnea nor retractions. Breath sounds are clear and equal bilaterally. No wheezes/rales/rhonchi. Gastrointestinal: Soft and nontender. Normal bowel sounds Musculoskeletal: Chronic-appearing right ankle deformity. Abrasion is noted around the right ankle with chronic-appearing lateral ulceration. No erythema or purulent drainage. Neurologic:  Normal speech and language. No gross focal neurologic deficits are appreciated.  Skin:  Diffuse papular lesions are noted from head to toe Psychiatric: Mood and affect are normal. Speech and behavior are normal.  ____________________________________________  ED  COURSE:  Pertinent labs & imaging results that were available during my care of the patient were reviewed by me and considered in my medical decision making (see chart for details). Patient presents for a rash as well as acute on chronic right ankle pain, we will assess with labs and provide oral medications for itching.   Procedures ____________________________________________   LABS (pertinent  positives/negatives)  Labs Reviewed  CBC WITH DIFFERENTIAL/PLATELET - Abnormal; Notable for the following:       Result Value   RDW 14.6 (*)    All other components within normal limits  COMPREHENSIVE METABOLIC PANEL - Abnormal; Notable for the following:    CO2 21 (*)    Glucose, Bld 134 (*)    ALT 8 (*)    All other components within normal limits   ___________________________________________  FINAL ASSESSMENT AND PLAN  Rash, chronic leg wound  Plan: Patient's labs were dictated above. Patient had presented for chronic ankle pain as well as a diffuse rash with itching and drainage from his right leg. His labs are reassuring and again his main concern is receiving pain medicine for his right ankle which advised we could not provide. I will place him on permethrin cream as well as steroid taper and Bactroban topical ointment. He'll be referred to the wound clinic for outpatient follow-up.   Emily FilbertWilliams, Rylynn Kobs E, MD   Note: This note was generated in part or whole with voice recognition software. Voice recognition is usually quite accurate but there are transcription errors that can and very often do occur. I apologize for any typographical errors that were not detected and corrected.     Emily FilbertWilliams, Mykah Shin E, MD 12/25/16 [phone]

## 2017-05-06 ENCOUNTER — Emergency Department: Payer: Medicaid Other

## 2017-05-06 ENCOUNTER — Emergency Department
Admission: EM | Admit: 2017-05-06 | Discharge: 2017-05-06 | Disposition: A | Discharge status: Home / Self Care | Payer: Medicaid Other | Attending: Emergency Medicine | Admitting: Emergency Medicine

## 2017-05-06 ENCOUNTER — Encounter: Payer: Self-pay | Admitting: Emergency Medicine

## 2017-05-06 ENCOUNTER — Other Ambulatory Visit: Payer: Self-pay

## 2017-05-06 DIAGNOSIS — Y939 Activity, unspecified: Secondary | ICD-10-CM | POA: Insufficient documentation

## 2017-05-06 DIAGNOSIS — X58XXXA Exposure to other specified factors, initial encounter: Secondary | ICD-10-CM | POA: Insufficient documentation

## 2017-05-06 DIAGNOSIS — R2241 Localized swelling, mass and lump, right lower limb: Secondary | ICD-10-CM | POA: Diagnosis not present

## 2017-05-06 DIAGNOSIS — M25551 Pain in right hip: Secondary | ICD-10-CM | POA: Insufficient documentation

## 2017-05-06 DIAGNOSIS — F17210 Nicotine dependence, cigarettes, uncomplicated: Secondary | ICD-10-CM | POA: Insufficient documentation

## 2017-05-06 DIAGNOSIS — S8291XA Unspecified fracture of right lower leg, initial encounter for closed fracture: Secondary | ICD-10-CM | POA: Insufficient documentation

## 2017-05-06 DIAGNOSIS — Y929 Unspecified place or not applicable: Secondary | ICD-10-CM | POA: Insufficient documentation

## 2017-05-06 DIAGNOSIS — I2510 Atherosclerotic heart disease of native coronary artery without angina pectoris: Secondary | ICD-10-CM | POA: Diagnosis not present

## 2017-05-06 DIAGNOSIS — M25471 Effusion, right ankle: Secondary | ICD-10-CM

## 2017-05-06 DIAGNOSIS — Y998 Other external cause status: Secondary | ICD-10-CM | POA: Diagnosis not present

## 2017-05-06 DIAGNOSIS — I10 Essential (primary) hypertension: Secondary | ICD-10-CM | POA: Insufficient documentation

## 2017-05-06 DIAGNOSIS — M1990 Unspecified osteoarthritis, unspecified site: Secondary | ICD-10-CM | POA: Diagnosis not present

## 2017-05-06 DIAGNOSIS — Z951 Presence of aortocoronary bypass graft: Secondary | ICD-10-CM | POA: Diagnosis not present

## 2017-05-06 DIAGNOSIS — M25571 Pain in right ankle and joints of right foot: Secondary | ICD-10-CM

## 2017-05-06 DIAGNOSIS — S99911A Unspecified injury of right ankle, initial encounter: Secondary | ICD-10-CM | POA: Diagnosis present

## 2017-05-06 DIAGNOSIS — S8291XK Unspecified fracture of right lower leg, subsequent encounter for closed fracture with nonunion: Secondary | ICD-10-CM

## 2017-05-06 LAB — CBC
HCT: 38.5 % — ABNORMAL LOW (ref 40.0–52.0)
Hemoglobin: 12.9 g/dL — ABNORMAL LOW (ref 13.0–18.0)
MCH: 31.1 pg (ref 26.0–34.0)
MCHC: 33.5 g/dL (ref 32.0–36.0)
MCV: 92.6 fL (ref 80.0–100.0)
PLATELETS: 271 10*3/uL (ref 150–440)
RBC: 4.16 MIL/uL — ABNORMAL LOW (ref 4.40–5.90)
RDW: 14.1 % (ref 11.5–14.5)
WBC: 11.8 10*3/uL — ABNORMAL HIGH (ref 3.8–10.6)

## 2017-05-06 LAB — BASIC METABOLIC PANEL
Anion gap: 8 (ref 5–15)
BUN: 11 mg/dL (ref 6–20)
CALCIUM: 8.6 mg/dL — AB (ref 8.9–10.3)
CO2: 21 mmol/L — ABNORMAL LOW (ref 22–32)
CREATININE: 0.98 mg/dL (ref 0.61–1.24)
Chloride: 108 mmol/L (ref 101–111)
Glucose, Bld: 102 mg/dL — ABNORMAL HIGH (ref 65–99)
Potassium: 4.2 mmol/L (ref 3.5–5.1)
SODIUM: 137 mmol/L (ref 135–145)

## 2017-05-06 MED ORDER — NAPROXEN 500 MG PO TABS: 500.0000 mg | ORAL_TABLET | Freq: Two times a day (BID) | ORAL | 0 refills | Status: DC

## 2017-05-06 MED ORDER — CLINDAMYCIN HCL 300 MG PO CAPS: 300.0000 mg | ORAL_CAPSULE | Freq: Four times a day (QID) | ORAL | 0 refills | Status: AC

## 2017-05-06 MED ORDER — METHYLPREDNISOLONE SODIUM SUCC 125 MG IJ SOLR
125.0000 mg | Freq: Once | INTRAMUSCULAR | Status: AC
  Administered 2017-05-06: 125 mg via INTRAVENOUS
  Filled 2017-05-06: qty 2

## 2017-05-06 MED ORDER — PREDNISONE 10 MG PO TABS: ORAL_TABLET | ORAL | 0 refills | Status: DC

## 2017-05-06 MED ORDER — KETOROLAC TROMETHAMINE 30 MG/ML IJ SOLN
30.0000 mg | Freq: Once | INTRAMUSCULAR | Status: AC
  Administered 2017-05-06: 30 mg via INTRAVENOUS
  Filled 2017-05-06: qty 1

## 2017-05-06 MED ORDER — OXYCODONE-ACETAMINOPHEN 5-325 MG PO TABS
1.0000 | ORAL_TABLET | Freq: Once | ORAL | Status: AC
  Administered 2017-05-06: 1 via ORAL
  Filled 2017-05-06: qty 1

## 2017-05-06 MED ORDER — CLINDAMYCIN PHOSPHATE 600 MG/50ML IV SOLN
600.0000 mg | Freq: Once | INTRAVENOUS | Status: AC
  Administered 2017-05-06: 600 mg via INTRAVENOUS
  Filled 2017-05-06: qty 50

## 2017-05-06 NOTE — ED Provider Notes (Signed)
[facility] Emergency Department Provider Note  ____________________________________________  Time seen: Approximately 10:19 AM  I have reviewed the triage vital signs and the nursing notes.   HISTORY  Chief Complaint Ankle Pain and Hip Pain    HPI Jeremy [name] is a 61 y.o. male that presents emergency department for evaluation of right ankle pain for 2 days.  Pain is primarily on the outside of his ankle.  He has been unable to move his ankle due to pain.  He has been laying in bed for 2 days because ankle is too painful to walk on.  No trauma.  Patient [place].  No alleviating measures have been attempted.  He denies fever, chills, nausea, vomiting, abdominal pain.  Past Medical History:  Diagnosis Date  . Coronary artery disease   . Hypertension   . S/P CABG x 1     There are no active problems to display for this patient.   Past Surgical History:  Procedure Laterality Date  . ANKLE ARTHROSCOPY W/ OPEN REPAIR      Prior to Admission medications   Medication Sig Start Date End Date Taking? Authorizing Provider  clindamycin (CLEOCIN) 300 MG capsule Take 1 capsule (300 mg total) by mouth 4 (four) times daily for 10 days. 05/06/17 05/16/17  Enid DerryWagner, Vaudie Engebretsen, PA-C  hydrOXYzine (VISTARIL) 25 MG capsule Take 1 capsule (25 mg total) by mouth 3 (three) times daily as needed. 10/23/15   Beers, Charmayne Sheerharles M, PA-C  ibuprofen (ADVIL,MOTRIN) 600 MG tablet Take 1 tablet (600 mg total) by mouth every 8 (eight) hours as needed. 12/25/16   Emily FilbertWilliams, Jonathan E, MD  mupirocin ointment Idelle Jo(BACTROBAN) 2 % Apply to affected area 3 times daily 12/25/16 12/25/17  Emily FilbertWilliams, Jonathan E, MD  naproxen (NAPROSYN) 500 MG tablet Take 1 tablet (500 mg total) by mouth 2 (two) times daily with a meal. 05/06/17 05/06/18  Enid DerryWagner, Janki Dike, PA-C  oxyCODONE-acetaminophen (ROXICET) 5-325 MG tablet Take 1-2 tablets by mouth every 6 (six) hours as needed. 04/08/16    Myrna BlazerSchaevitz, David Matthew, MD  permethrin (PERMETHRIN LICE TREATMENT) 1 % lotion Apply to affected area once 12/25/16 12/24/17  Emily FilbertWilliams, Jonathan E, MD  predniSONE (DELTASONE) 10 MG tablet Take 6 tablets on day 1, take 5 tablets on day 2, take 4 tablets on day 3, take 3 tablets on day 4, take 2 tablets on day 5, take 1 tablet on day 6 05/06/17   Enid DerryWagner, Merranda Bolls, PA-C  sulfamethoxazole-trimethoprim (BACTRIM DS,SEPTRA DS) 800-160 MG tablet Take 2 tablets by mouth 2 (two) times daily. 04/08/16   Myrna BlazerSchaevitz, David Matthew, MD    Allergies Amoxicillin  No family history on file.  Social History Social History   Tobacco Use  . Smoking status: Current Some Day Smoker    Packs/day: 0.50    Types: Cigarettes  . Smokeless tobacco: Never Used  Substance Use Topics  . Alcohol use: Yes    Alcohol/week: 0.6 oz    Types: 1 Cans of beer per week  . Drug use: Not on file     Review of Systems  Constitutional: No fever/chills Cardiovascular: No chest pain. Respiratory:  No SOB. Gastrointestinal: No abdominal pain.  No nausea, no vomiting.  Musculoskeletal: Positive for ankle pain. Skin: Negative for rash, abrasions, lacerations, ecchymosis. Neurological: Negative for headaches, numbness or tingling   ____________________________________________   PHYSICAL EXAM:  VITAL SIGNS: ED Triage Vitals  Enc Vitals Group     BP 05/06/17 0834 Marland Kitchen(!)  173/70     Pulse Rate 05/06/17 0834 91     Resp 05/06/17 0834 18     Temp 05/06/17 0834 98.3 F (36.8 C)     Temp Source 05/06/17 0834 Oral     SpO2 05/06/17 0834 100 %     Weight 05/06/17 0835 240 lb (108.9 kg)     Height 05/06/17 0835 5\' 11"  (1.803 m)     Head Circumference --      Peak Flow --      Pain Score 05/06/17 0833 10     Pain Loc --      Pain Edu? --      Excl. in GC? --      Constitutional: Alert and oriented. Well appearing and in no acute distress. Eyes: Conjunctivae are normal. PERRL. EOMI. Head: Atraumatic. ENT:       Ears:      Nose: No congestion/rhinnorhea.      Mouth/Throat: Mucous membranes are moist.  Neck: No stridor.  Cardiovascular: Normal rate, regular rhythm.  Good peripheral circulation. Respiratory: Normal respiratory effort without tachypnea or retractions. Lungs CTAB. Good air entry to the bases with no decreased or absent breath sounds. Musculoskeletal: Full range of motion to all extremities. No gross deformities appreciated.  Moderate swelling and warmth to lateral right ankle.  Pain with range of motion.  No wound. Neurologic:  Normal speech and language. No gross focal neurologic deficits are appreciated.  Skin:  Skin is warm, dry and intact. No rash noted.   ____________________________________________   LABS (all labs ordered are listed, but only abnormal results are displayed)  Labs Reviewed  CBC - Abnormal; Notable for the following components:      Result Value   WBC 11.8 (*)    RBC 4.16 (*)    Hemoglobin 12.9 (*)    HCT 38.5 (*)    All other components within normal limits  BASIC METABOLIC PANEL - Abnormal; Notable for the following components:   CO2 21 (*)    Glucose, Bld 102 (*)    Calcium 8.6 (*)    All other components within normal limits   ____________________________________________  EKG   ____________________________________________  RADIOLOGY Lexine BatonI, Hayzlee Mcsorley, personally viewed and evaluated these images (plain radiographs) as part of my medical decision making, as well as reviewing the written report by the radiologist.  Dg Ankle Complete Right  Result Date: 05/06/2017 CLINICAL DATA:  Right ankle pain and swelling.  No recent injury. 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 There is bone resorption around all of the distal screws. One of the distal screws is chronically fractured with the superficial portion of the screw being  in the soft tissues. The distal most screw is chronically not anchored in bone. There is chronic severe arthritis at the ankle joint. IMPRESSION: 1. 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 2. Nonunion fracture of fibular shaft. 3. 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 Electronically Signed   By: Francene BoyersJames  Maxwell M.D.   On: 05/06/2017 09:22   Dg Hip Unilat W Or Wo Pelvis 2-3 Views Right  Result Date: 05/06/2017 CLINICAL DATA:  Right hip pain.  No recent injury. EXAM: DG HIP (WITH OR WITHOUT PELVIS) 2-3V RIGHT COMPARISON:  None. FINDINGS: There is no evidence of hip fracture or dislocation. There is medial joint space narrowing with osteophyte formation on the  right acetabulum and femoral head. There are similar but more advanced findings in the left hip. Pelvic bones otherwise appear normal. IMPRESSION: Slight arthritic changes of the right hip. Similar but more advanced arthritic changes of the left hip. Electronically Signed   By: Francene Boyers M.D.   On: 05/06/2017 09:25    ____________________________________________    PROCEDURES  Procedure(s) performed:    Procedures    Medications  oxyCODONE-acetaminophen (PERCOCET/ROXICET) 5-325 MG per tablet 1 tablet (1 tablet Oral Given 05/06/17 0937)  methylPREDNISolone sodium succinate (SOLU-MEDROL) 125 mg/2 mL injection 125 mg (125 mg Intravenous Given 05/06/17 1100)  clindamycin (CLEOCIN) IVPB 600 mg (0 mg Intravenous Stopped 05/06/17 1141)  ketorolac (TORADOL) 30 MG/ML injection 30 mg (30 mg Intravenous Given 05/06/17 1100)     ____________________________________________   INITIAL IMPRESSION / ASSESSMENT AND PLAN / ED COURSE  Pertinent labs & imaging results that were available during my care of the patient were reviewed by me and considered in my medical decision making (see chart for details).  Review of the [place] CSRS was performed in accordance of the [facility] prior to dispensing any controlled  drugs.   Patient presented to the emergency department for evaluation of right ankle pain for 2 days.  Vital signs and exam are reassuring.  Patient is afebrile and did not have any Tylenol or ibuprofen prior to arrival.  X-ray consistent with chronic arthritis, nonunion of fibular fracture and loosening of distal screws from previous surgery.  Patient has a mildly increased white blood cell count, which is likely due to inflammation.  Patient states that he had surgery in [place] 3 years ago.  Patient has warmth and swelling to lateral ankle.  Suspicion for septic joint is low but strict return precautions were given.  Vital signs are stable, patient is afebrile, and patient has no open wounds.  Patient felt "amazing" after Percocet.  Symptoms are likely due to inflammation of joint but I will also cover for bacterial cause.  Patient will be discharged home with prescriptions for prednisone, clindamycin, naproxen.  Ankle was Ace wrapped and crutches were given.  Patient is to follow up with orthopedics as directed. Patient is given ED precautions to return to the ED for any worsening or new symptoms.     ____________________________________________  FINAL CLINICAL IMPRESSION(S) / ED DIAGNOSES  Final diagnoses:  Arthritis  Lower leg fracture, right, closed, with nonunion, subsequent encounter  Acute right ankle pain  Right ankle swelling      NEW MEDICATIONS STARTED DURING THIS VISIT:      This chart was dictated using voice recognition software/Dragon. Despite best efforts to proofread, errors can occur which can change the meaning. Any change was purely unintentional.    Enid Derry, PA-C 05/06/17 1509    Sharyn Creamer, MD 05/06/17 [phone]

## 2017-05-06 NOTE — ED Notes (Signed)
Pt returned from Xray is resting in bed awaiting EDP.

## 2017-05-06 NOTE — ED Notes (Signed)
Patient transported to X-ray 

## 2017-05-06 NOTE — ED Triage Notes (Signed)
Pt arrived via EMS from home for reports of right ankle and hip pain that began two days ago. Pt reports has been unable to get out of bed due to the pain. Pt presents with swelling to right ankle, denies injury. Pt reports history of fracture in the right ankle. EMS reports VSS.

## 2017-06-02 ENCOUNTER — Ambulatory Visit: Payer: Self-pay | Admitting: Orthopedic Surgery

## 2017-06-03 ENCOUNTER — Encounter
Admission: RE | Admit: 2017-06-03 | Discharge: 2017-06-03 | Disposition: A | Discharge status: Home / Self Care | Payer: Medicaid Other | Source: Ambulatory Visit | Attending: Orthopedic Surgery | Admitting: Orthopedic Surgery

## 2017-06-03 ENCOUNTER — Other Ambulatory Visit: Payer: Self-pay

## 2017-06-03 DIAGNOSIS — R9431 Abnormal electrocardiogram [ECG] [EKG]: Secondary | ICD-10-CM | POA: Diagnosis not present

## 2017-06-03 DIAGNOSIS — I10 Essential (primary) hypertension: Secondary | ICD-10-CM | POA: Diagnosis not present

## 2017-06-03 DIAGNOSIS — I2510 Atherosclerotic heart disease of native coronary artery without angina pectoris: Secondary | ICD-10-CM | POA: Insufficient documentation

## 2017-06-03 DIAGNOSIS — Z01810 Encounter for preprocedural cardiovascular examination: Secondary | ICD-10-CM | POA: Insufficient documentation

## 2017-06-03 NOTE — Patient Instructions (Addendum)
Your procedure is scheduled on: Monday, June 13, 2017 Report to Same Day Surgery on the 2nd floor in the [facility]. To find out your arrival time, please call [phone] between 1PM - 3PM on: Friday, June 10, 2017  REMEMBER: Instructions that are not followed completely may result in serious medical risk, up to and including death; or upon the discretion of your surgeon and anesthesiologist your surgery may need to be rescheduled.  Do not eat food after midnight the night before your procedure.  No gum chewing or hard candies.  You may however, drink CLEAR liquids up to 2 hours before you are scheduled to arrive at the hospital for your procedure.  Do not drink clear liquids within 2 hours of the start of your surgery.  Clear liquids include: - water  - apple juice without pulp - clear gatorade - black coffee or tea (Do NOT add anything to the coffee or tea) Do NOT drink anything that is not on this list.  No Alcohol for 24 hours before or after surgery.  No Smoking including e-cigarettes for 24 hours prior to surgery. No chewable tobacco products for at least 6 hours prior to surgery. No nicotine patches on the day of surgery.  Notify your doctor if there is any change in your medical condition (cold, fever, infection).  Do not wear jewelry, make-up, hairpins, clips or nail polish.  Do not wear lotions, powders, or perfumes. You may wear deodorant.  Do not shave 48 hours prior to surgery. Men may shave face and neck.  Contacts and dentures may not be worn into surgery.  Do not bring valuables to the hospital. [facility] is [facility] responsible for any belongings or valuables.   TAKE THESE MEDICATIONS THE MORNING OF SURGERY WITH A SIP OF WATER:    1.  Tramadol if needed for pain  Use CHG Soap as directed on instruction sheet.  NOW!  Stop ASPIRIN OR ANY Anti-inflammatories such as MELOXICAM, Advil, Aleve, Ibuprofen, Motrin, Naproxen, Naprosyn, Goodie powder, or  aspirin products. (May take Tylenol or Acetaminophen if needed.)  NOW!  Stop ANY OVER THE COUNTER supplements until after surgery.   If you are being discharged the day of surgery, you will not be allowed to drive home. You will need someone to drive you home and stay with you that night.   If you are taking public transportation, you will need to have a responsible adult to with you.  Please call the number above if you have any questions about these instructions.

## 2017-06-03 NOTE — Pre-Procedure Instructions (Signed)
Patient phoned in and read prescription bottle labels to me. Instructed to stop taking the meloxicam as of now until after surgery and he could continue taking the tramadol as needed per label instructions.

## 2017-06-03 NOTE — Pre-Procedure Instructions (Signed)
Patient did not bring medications or a list of medications to the pre-admit testing appointment. He did not know what medication he was taking, only could say he took a little yellow pill and a pain pill. Was instructed to call once he got home and read from the prescription bottles so that his information could be accurate. Patient acknowledges understanding.

## 2017-06-09 ENCOUNTER — Other Ambulatory Visit: Payer: Self-pay | Admitting: Internal Medicine

## 2017-06-09 DIAGNOSIS — R0602 Shortness of breath: Secondary | ICD-10-CM

## 2017-06-09 DIAGNOSIS — I208 Other forms of angina pectoris: Secondary | ICD-10-CM

## 2017-06-09 DIAGNOSIS — Z01818 Encounter for other preprocedural examination: Secondary | ICD-10-CM

## 2017-06-12 MED ORDER — CLINDAMYCIN PHOSPHATE 900 MG/50ML IV SOLN: 900.0000 mg | INTRAVENOUS | Status: DC

## 2017-06-13 ENCOUNTER — Ambulatory Visit
Admission: RE | Admit: 2017-06-13 | Discharge: 2017-06-13 | Disposition: A | Discharge status: Home / Self Care | Payer: Medicaid Other | Source: Ambulatory Visit | Attending: Orthopedic Surgery | Admitting: Orthopedic Surgery

## 2017-06-13 ENCOUNTER — Encounter: Payer: Self-pay | Admitting: Anesthesiology

## 2017-06-13 ENCOUNTER — Encounter
Admission: RE | Disposition: A | Discharge status: Home / Self Care | Payer: Self-pay | Source: Ambulatory Visit | Attending: Orthopedic Surgery

## 2017-06-13 ENCOUNTER — Encounter: Payer: Self-pay | Admitting: *Deleted

## 2017-06-13 ENCOUNTER — Other Ambulatory Visit: Payer: Self-pay

## 2017-06-13 DIAGNOSIS — Z5309 Procedure and treatment not carried out because of other contraindication: Secondary | ICD-10-CM | POA: Diagnosis not present

## 2017-06-13 DIAGNOSIS — M24071 Loose body in right ankle: Secondary | ICD-10-CM | POA: Diagnosis not present

## 2017-06-13 LAB — URINE DRUG SCREEN, QUALITATIVE (ARMC ONLY)
AMPHETAMINES, UR SCREEN: NOT DETECTED
Barbiturates, Ur Screen: NOT DETECTED
Benzodiazepine, Ur Scrn: NOT DETECTED
Cannabinoid 50 Ng, Ur ~~LOC~~: POSITIVE — AB
Cocaine Metabolite,Ur ~~LOC~~: POSITIVE — AB
MDMA (ECSTASY) UR SCREEN: NOT DETECTED
Methadone Scn, Ur: NOT DETECTED
OPIATE, UR SCREEN: NOT DETECTED
PHENCYCLIDINE (PCP) UR S: NOT DETECTED
Tricyclic, Ur Screen: NOT DETECTED

## 2017-06-13 SURGERY — REMOVAL, HARDWARE
Anesthesia: Choice | Laterality: Right

## 2017-06-13 MED ORDER — ACETAMINOPHEN 500 MG PO TABS
1000.0000 mg | ORAL_TABLET | Freq: Once | ORAL | Status: AC
  Administered 2017-06-13: 1000 mg via ORAL

## 2017-06-13 MED ORDER — GABAPENTIN 300 MG PO CAPS
ORAL_CAPSULE | ORAL | Status: AC
  Filled 2017-06-13: qty 1

## 2017-06-13 MED ORDER — GABAPENTIN 300 MG PO CAPS
300.0000 mg | ORAL_CAPSULE | Freq: Once | ORAL | Status: AC
  Administered 2017-06-13: 300 mg via ORAL

## 2017-06-13 MED ORDER — PROPOFOL 10 MG/ML IV BOLUS
INTRAVENOUS | Status: AC
  Filled 2017-06-13: qty 20

## 2017-06-13 MED ORDER — MIDAZOLAM HCL 2 MG/2ML IJ SOLN
INTRAMUSCULAR | Status: AC
  Filled 2017-06-13: qty 2

## 2017-06-13 MED ORDER — PENTAFLUOROPROP-TETRAFLUOROETH EX AERO
INHALATION_SPRAY | Freq: Once | CUTANEOUS | Status: AC
  Administered 2017-06-13: 30 via TOPICAL

## 2017-06-13 MED ORDER — ACETAMINOPHEN 500 MG PO TABS
ORAL_TABLET | ORAL | Status: AC
  Filled 2017-06-13: qty 2

## 2017-06-13 MED ORDER — CHLORHEXIDINE GLUCONATE 4 % EX LIQD: 60.0000 mL | Freq: Once | CUTANEOUS | Status: DC

## 2017-06-13 MED ORDER — CLINDAMYCIN PHOSPHATE 900 MG/50ML IV SOLN
INTRAVENOUS | Status: AC
  Filled 2017-06-13: qty 50

## 2017-06-13 MED ORDER — PENTAFLUOROPROP-TETRAFLUOROETH EX AERO
INHALATION_SPRAY | CUTANEOUS | Status: AC
  Administered 2017-06-13: 30 via TOPICAL
  Filled 2017-06-13: qty 30

## 2017-06-13 MED ORDER — FENTANYL CITRATE (PF) 100 MCG/2ML IJ SOLN
INTRAMUSCULAR | Status: AC
  Filled 2017-06-13: qty 2

## 2017-06-13 MED ORDER — FAMOTIDINE 20 MG PO TABS
20.0000 mg | ORAL_TABLET | Freq: Once | ORAL | Status: AC
  Administered 2017-06-13: 20 mg via ORAL

## 2017-06-13 MED ORDER — LACTATED RINGERS IV SOLN
INTRAVENOUS | Status: DC
  Administered 2017-06-13: 11:00:00 via INTRAVENOUS

## 2017-06-13 MED ORDER — FAMOTIDINE 20 MG PO TABS
ORAL_TABLET | ORAL | Status: AC
  Filled 2017-06-13: qty 1

## 2017-06-13 SURGICAL SUPPLY — 31 items
BANDAGE ACE 4X5 VEL STRL LF (GAUZE/BANDAGES/DRESSINGS) ×2 IMPLANT
BNDG COHESIVE 4X5 TAN STRL (GAUZE/BANDAGES/DRESSINGS) ×2 IMPLANT
BRUSH SCRUB 4% CHG (MISCELLANEOUS) ×2 IMPLANT
CANISTER SUCT 1200ML W/VALVE (MISCELLANEOUS) ×2 IMPLANT
CHLORAPREP W/TINT 26ML (MISCELLANEOUS) ×2 IMPLANT
CUFF TOURN 18 STER (MISCELLANEOUS) IMPLANT
CUFF TOURN 24 STER (MISCELLANEOUS) IMPLANT
DRAPE FLUOR MINI C-ARM 54X84 (DRAPES) ×2 IMPLANT
DRAPE INCISE IOBAN 66X45 STRL (DRAPES) ×2 IMPLANT
ELECT CAUTERY BLADE 6.4 (BLADE) ×2 IMPLANT
ELECT REM PT RETURN 9FT ADLT (ELECTROSURGICAL) ×2
ELECTRODE REM PT RTRN 9FT ADLT (ELECTROSURGICAL) ×1 IMPLANT
GAUZE PETRO XEROFOAM 1X8 (MISCELLANEOUS) ×2 IMPLANT
GAUZE SPONGE 4X4 12PLY STRL (GAUZE/BANDAGES/DRESSINGS) ×2 IMPLANT
GLOVE INDICATOR 8.0 STRL GRN (GLOVE) ×2 IMPLANT
GLOVE SURG ORTHO 8.0 STRL STRW (GLOVE) ×4 IMPLANT
GOWN STRL REUS W/ TWL LRG LVL3 (GOWN DISPOSABLE) ×1 IMPLANT
GOWN STRL REUS W/ TWL XL LVL3 (GOWN DISPOSABLE) ×1 IMPLANT
GOWN STRL REUS W/TWL LRG LVL3 (GOWN DISPOSABLE) ×1
GOWN STRL REUS W/TWL XL LVL3 (GOWN DISPOSABLE) ×1
KIT RM TURNOVER STRD PROC AR (KITS) ×2 IMPLANT
NEEDLE FILTER BLUNT 18X 1/2SAF (NEEDLE) ×1
NEEDLE FILTER BLUNT 18X1 1/2 (NEEDLE) ×1 IMPLANT
NS IRRIG 1000ML POUR BTL (IV SOLUTION) ×2 IMPLANT
PACK EXTREMITY ARMC (MISCELLANEOUS) ×2 IMPLANT
STAPLER SKIN PROX 35W (STAPLE) ×2 IMPLANT
STOCKINETTE M/LG 89821 (MISCELLANEOUS) ×2 IMPLANT
SUT PROLENE 4 0 PS 2 18 (SUTURE) ×2 IMPLANT
SUT VIC AB 2-0 SH 27 (SUTURE) ×2
SUT VIC AB 2-0 SH 27XBRD (SUTURE) ×2 IMPLANT
SYR 10ML LL (SYRINGE) ×2 IMPLANT

## 2017-06-13 NOTE — Progress Notes (Signed)
Patient cancelled due to positive UDS.  

## 2017-11-20 ENCOUNTER — Emergency Department: Payer: Medicaid Other

## 2017-11-20 ENCOUNTER — Emergency Department
Admission: EM | Admit: 2017-11-20 | Discharge: 2017-11-20 | Disposition: A | Discharge status: Home / Self Care | Payer: Medicaid Other | Attending: Emergency Medicine | Admitting: Emergency Medicine

## 2017-11-20 ENCOUNTER — Encounter: Payer: Self-pay | Admitting: Emergency Medicine

## 2017-11-20 DIAGNOSIS — Z951 Presence of aortocoronary bypass graft: Secondary | ICD-10-CM | POA: Insufficient documentation

## 2017-11-20 DIAGNOSIS — R2241 Localized swelling, mass and lump, right lower limb: Secondary | ICD-10-CM | POA: Diagnosis present

## 2017-11-20 DIAGNOSIS — I2510 Atherosclerotic heart disease of native coronary artery without angina pectoris: Secondary | ICD-10-CM | POA: Insufficient documentation

## 2017-11-20 DIAGNOSIS — I10 Essential (primary) hypertension: Secondary | ICD-10-CM | POA: Insufficient documentation

## 2017-11-20 DIAGNOSIS — L03115 Cellulitis of right lower limb: Secondary | ICD-10-CM | POA: Diagnosis not present

## 2017-11-20 DIAGNOSIS — Z79899 Other long term (current) drug therapy: Secondary | ICD-10-CM | POA: Diagnosis not present

## 2017-11-20 DIAGNOSIS — F17210 Nicotine dependence, cigarettes, uncomplicated: Secondary | ICD-10-CM | POA: Diagnosis not present

## 2017-11-20 MED ORDER — CEPHALEXIN 500 MG PO CAPS: 500.0000 mg | ORAL_CAPSULE | Freq: Three times a day (TID) | ORAL | 0 refills | Status: DC

## 2017-11-20 MED ORDER — PERMETHRIN 5 % EX CREA: TOPICAL_CREAM | CUTANEOUS | 0 refills | Status: DC

## 2017-11-20 MED ORDER — SULFAMETHOXAZOLE-TRIMETHOPRIM 800-160 MG PO TABS: 1.0000 | ORAL_TABLET | Freq: Two times a day (BID) | ORAL | 0 refills | Status: DC

## 2017-11-20 NOTE — ED Provider Notes (Signed)
[facility] Emergency Department Provider Note  ____________________________________________  Time seen: Approximately 5:14 PM  I have reviewed the triage vital signs and the nursing notes.   HISTORY  Chief Complaint Ankle Problem    HPI Jeremy [name] is a 62 y.o. 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  He is having swelling over the area over the past few weeks.  He complains of some occasional liquid drainage from the site that is nonpurulent.  Also been more irritated and he has been scratching it more.  Pain is intermittent, no aggravating or alleviating factors, nonradiating.  No fevers chills sweats chest pain or shortness of breath.      Past Medical History:  Diagnosis Date  . Coronary artery disease   . Hypertension   . S/P CABG x 1      There are no active problems to display for this patient.    Past Surgical History:  Procedure Laterality Date  . ANKLE ARTHROSCOPY W/ OPEN REPAIR Right   . CORONARY ARTERY BYPASS GRAFT       Prior to Admission medications   Medication Sig Start Date End Date Taking? Authorizing Provider  cephALEXin (KEFLEX) 500 MG capsule Take 1 capsule (500 mg total) by mouth 3 (three) times daily. 11/20/17   Sharman Cheek, MD  meloxicam (MOBIC) 15 MG tablet Take 15 mg by mouth daily.    [provider]  permethrin (ELIMITE) 5 % cream Thoroughly massage cream (using about half the tube) from head to soles of feet; leave on for 8 to 14 hours before removing (shower or bath); 11/20/17   Sharman Cheek, MD  sulfamethoxazole-trimethoprim (BACTRIM DS) 800-160 MG tablet Take 1 tablet by mouth 2 (two) times daily. 11/20/17   Sharman Cheek, MD  traMADol (ULTRAM) 50 MG tablet Take 50 mg by mouth every 6 (six) hours as needed.    [provider]     Allergies Amoxicillin   Family  History  Problem Relation Age of Onset  . Heart attack Father     Social History Social History   Tobacco Use  . Smoking status: Current Some Day Smoker    Packs/day: 0.50    Types: Cigarettes  . Smokeless tobacco: Former Engineer, water Use Topics  . Alcohol use: Yes    Alcohol/week: 0.6 oz    Types: 1 Cans of beer per week  . Drug use: Yes    Types: Marijuana    Review of Systems  Constitutional:   No fever or chills.  ENT:   No sore throat. No rhinorrhea. Cardiovascular:   No chest pain or syncope. Respiratory:   No dyspnea or cough. Gastrointestinal:   Negative for abdominal pain, vomiting and diarrhea.  Musculoskeletal:   Right ankle swelling as above All other systems reviewed and are negative except as documented above in ROS and HPI.  ____________________________________________   PHYSICAL EXAM:  VITAL SIGNS: ED Triage Vitals  Enc Vitals Group     BP 11/20/17 1447 (!) 146/77     Pulse Rate 11/20/17 1447 81     Resp 11/20/17 1447 18     Temp 11/20/17 1447 (!) 97.5 F (36.4 C)     Temp Source 11/20/17 1447 Oral     SpO2 11/20/17 1447 96 %     Weight 11/20/17 1448 260 lb (117.9 kg)     Height 11/20/17  1448 5\' 11"  (1.803 m)     Head Circumference --      Peak Flow --      Pain Score 11/20/17 1447 5     Pain Loc --      Pain Edu? --      Excl. in GC? --     Vital signs reviewed, nursing assessments reviewed.   Constitutional:   Alert and oriented. Non-toxic appearance.  Obese Eyes:   Conjunctivae are normal. EOMI. PERRL. ENT      Head:   Normocephalic and atraumatic.            Mouth/Throat:   MMM, no pharyngeal erythema. No peritonsillar mass.       Neck:   No meningismus. Full ROM. Hematological/Lymphatic/Immunilogical:   No cervical lymphadenopathy. Cardiovascular:   RRR. Symmetric bilateral radial and DP pulses.  No murmurs.  Respiratory:   Normal respiratory effort without tachypnea/retractions. Breath sounds are clear and equal bilaterally.  No wheezes/rales/rhonchi. Gastrointestinal:   Soft and nontender. Non distended. There is no CVA tenderness.  No rebound, rigidity, or guarding.  Musculoskeletal:   Normal range of motion in all extremities.  Mild swelling at the lateral malleolus of the right ankle, 1 cm defect through the dermis into the subcutaneous space, not inflamed, no purulent drainage.  Unable to express any pus or drainage.  Not significantly tender.  No crepitus.  Chronic in appearance.  neurologic:   Normal speech and language.  Motor grossly intact. No acute focal neurologic deficits are appreciated.  Skin:    Skin is warm, dry with ankle wound as above.. No rash noted.  No petechiae, purpura, or bullae.  ____________________________________________    LABS (pertinent positives/negatives) (all labs ordered are listed, but only abnormal results are displayed) Labs Reviewed - No data to display ____________________________________________   EKG    ____________________________________________    RADIOLOGY  Dg Ankle Complete Right  Result Date: 11/20/2017 CLINICAL DATA:  Nonhealing wound at the dorsal surface of the ankle EXAM: RIGHT ANKLE - COMPLETE 3+ VIEW COMPARISON:  May 06, 2017 FINDINGS: There is no evidence of fracture, dislocation. Postsurgical changes identified in the right ankle. Chronic deformity of the fibula and ankle are noted. There is no bony destruction or osteopenia to suggest osteomyelitis. IMPRESSION: Chronic deformity of the right ankle. No bony destruction or osteopenia to suggest osteomyelitis. Electronically Signed   By: Sherian ReinWei-Chen  Lin M.D.   On: 11/20/2017 16:32    ____________________________________________   PROCEDURES Procedures  ____________________________________________    CLINICAL IMPRESSION / ASSESSMENT AND PLAN / ED COURSE  Pertinent labs & imaging results that were available during my care of the patient were reviewed by me and considered in my medical  decision making (see chart for details).    Patient presents with recurrent swelling and intermittent drainage from right ankle chronic wound that is been there for years.  Exam does not show acute infection, doubt osteomyelitis necrotizing fasciitis or abscess.  Possible cellulitis.  X-ray is unremarkable without evidence of periosteal reaction or infectious process or new fracture.  I will start the patient on Keflex and Bactrim, instruct him to follow-up with wound care.  Patient reports scabies exposure at home and feeling itchy sometimes.  I will provide him a prescription for permethrin as well.     ____________________________________________   FINAL CLINICAL IMPRESSION(S) / ED DIAGNOSES    Final diagnoses:  Cellulitis of right lower extremity     ED Discharge Orders  Ordered    cephALEXin (KEFLEX) 500 MG capsule  3 times daily     11/20/17 1705    sulfamethoxazole-trimethoprim (BACTRIM DS) 800-160 MG tablet  2 times daily     11/20/17 1705    permethrin (ELIMITE) 5 % cream     11/20/17 1705      Portions of this note were generated with dragon dictation software. Dictation errors may occur despite best attempts at proofreading.    Sharman Cheek, MD 11/20/17 [phone]

## 2017-11-20 NOTE — ED Triage Notes (Signed)
Patient presents to the ED with bloody fluid slowly draining from wound on patient's right ankle.  Patient states he had ankle surgery approx. 8 years ago and since then has had problems with post-surgical wound.  Patient states he has been scratching his ankle.  No obvious puss draining from area.  Patient is in no obvious distress at this time.  Patient denies pain to area.

## 2017-11-20 NOTE — ED Notes (Signed)
Pt has an old wound that he states will periodically start draining. The site if from a surgical screw that "came out" 8 years ago. Pt states they normally place him on antibiotics with follow up to pcp. Wound is wrapped with slight drainage on dressing.

## 2017-11-20 NOTE — Discharge Instructions (Signed)
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

## 2018-02-10 ENCOUNTER — Other Ambulatory Visit: Payer: Self-pay

## 2018-02-10 ENCOUNTER — Emergency Department: Payer: Medicaid Other

## 2018-02-10 ENCOUNTER — Encounter: Payer: Self-pay | Admitting: Radiology

## 2018-02-10 ENCOUNTER — Inpatient Hospital Stay
Admission: EM | Admit: 2018-02-10 | Discharge: 2018-02-13 | DRG: 637 | Disposition: A | Discharge status: Home / Self Care | Payer: Medicaid Other | Attending: Internal Medicine | Admitting: Internal Medicine

## 2018-02-10 ENCOUNTER — Inpatient Hospital Stay
Admit: 2018-02-10 | Discharge: 2018-02-10 | Disposition: A | Discharge status: Home / Self Care | Payer: Medicaid Other | Attending: Internal Medicine | Admitting: Internal Medicine

## 2018-02-10 DIAGNOSIS — K319 Disease of stomach and duodenum, unspecified: Secondary | ICD-10-CM | POA: Diagnosis present

## 2018-02-10 DIAGNOSIS — E1110 Type 2 diabetes mellitus with ketoacidosis without coma: Principal | ICD-10-CM

## 2018-02-10 DIAGNOSIS — Z8249 Family history of ischemic heart disease and other diseases of the circulatory system: Secondary | ICD-10-CM

## 2018-02-10 DIAGNOSIS — F17210 Nicotine dependence, cigarettes, uncomplicated: Secondary | ICD-10-CM | POA: Diagnosis present

## 2018-02-10 DIAGNOSIS — E876 Hypokalemia: Secondary | ICD-10-CM | POA: Diagnosis not present

## 2018-02-10 DIAGNOSIS — E871 Hypo-osmolality and hyponatremia: Secondary | ICD-10-CM | POA: Diagnosis present

## 2018-02-10 DIAGNOSIS — I10 Essential (primary) hypertension: Secondary | ICD-10-CM | POA: Diagnosis present

## 2018-02-10 DIAGNOSIS — L8990 Pressure ulcer of unspecified site, unspecified stage: Secondary | ICD-10-CM

## 2018-02-10 DIAGNOSIS — E0810 Diabetes mellitus due to underlying condition with ketoacidosis without coma: Secondary | ICD-10-CM

## 2018-02-10 DIAGNOSIS — N179 Acute kidney failure, unspecified: Secondary | ICD-10-CM | POA: Diagnosis present

## 2018-02-10 DIAGNOSIS — Z951 Presence of aortocoronary bypass graft: Secondary | ICD-10-CM

## 2018-02-10 DIAGNOSIS — K219 Gastro-esophageal reflux disease without esophagitis: Secondary | ICD-10-CM | POA: Diagnosis present

## 2018-02-10 DIAGNOSIS — E878 Other disorders of electrolyte and fluid balance, not elsewhere classified: Secondary | ICD-10-CM | POA: Diagnosis present

## 2018-02-10 DIAGNOSIS — G92 Toxic encephalopathy: Secondary | ICD-10-CM | POA: Diagnosis present

## 2018-02-10 DIAGNOSIS — I2510 Atherosclerotic heart disease of native coronary artery without angina pectoris: Secondary | ICD-10-CM | POA: Diagnosis present

## 2018-02-10 DIAGNOSIS — E860 Dehydration: Secondary | ICD-10-CM | POA: Diagnosis present

## 2018-02-10 DIAGNOSIS — E1169 Type 2 diabetes mellitus with other specified complication: Secondary | ICD-10-CM | POA: Diagnosis present

## 2018-02-10 DIAGNOSIS — R1013 Epigastric pain: Secondary | ICD-10-CM

## 2018-02-10 DIAGNOSIS — J181 Lobar pneumonia, unspecified organism: Secondary | ICD-10-CM | POA: Diagnosis present

## 2018-02-10 DIAGNOSIS — R739 Hyperglycemia, unspecified: Secondary | ICD-10-CM | POA: Diagnosis not present

## 2018-02-10 DIAGNOSIS — D649 Anemia, unspecified: Secondary | ICD-10-CM | POA: Diagnosis present

## 2018-02-10 HISTORY — DX: Type 2 diabetes mellitus without complications: E11.9

## 2018-02-10 LAB — GLUCOSE, CAPILLARY
GLUCOSE-CAPILLARY: 254 mg/dL — AB (ref 70–99)
GLUCOSE-CAPILLARY: 240 mg/dL — AB (ref 70–99)
GLUCOSE-CAPILLARY: 224 mg/dL — AB (ref 70–99)
Glucose-Capillary: 139 mg/dL — ABNORMAL HIGH (ref 70–99)
Glucose-Capillary: 142 mg/dL — ABNORMAL HIGH (ref 70–99)
Glucose-Capillary: 174 mg/dL — ABNORMAL HIGH (ref 70–99)
Glucose-Capillary: 198 mg/dL — ABNORMAL HIGH (ref 70–99)
Glucose-Capillary: 231 mg/dL — ABNORMAL HIGH (ref 70–99)
Glucose-Capillary: 253 mg/dL — ABNORMAL HIGH (ref 70–99)
Glucose-Capillary: 295 mg/dL — ABNORMAL HIGH (ref 70–99)
Glucose-Capillary: 371 mg/dL — ABNORMAL HIGH (ref 70–99)
Glucose-Capillary: 422 mg/dL — ABNORMAL HIGH (ref 70–99)
Glucose-Capillary: 451 mg/dL — ABNORMAL HIGH (ref 70–99)
Glucose-Capillary: 497 mg/dL — ABNORMAL HIGH (ref 70–99)
Glucose-Capillary: 537 mg/dL (ref 70–99)

## 2018-02-10 LAB — PHOSPHORUS: Phosphorus: 3 mg/dL (ref 2.5–4.6)

## 2018-02-10 LAB — URINALYSIS, COMPLETE (UACMP) WITH MICROSCOPIC
Bacteria, UA: NONE SEEN
Bilirubin Urine: NEGATIVE
Ketones, ur: 80 mg/dL — AB
Leukocytes, UA: NEGATIVE
NITRITE: NEGATIVE
PH: 5 (ref 5.0–8.0)
PROTEIN: 30 mg/dL — AB
Specific Gravity, Urine: 1.022 (ref 1.005–1.030)
Squamous Epithelial / LPF: NONE SEEN (ref 0–5)

## 2018-02-10 LAB — ECHOCARDIOGRAM COMPLETE
Height: 71 in
Weight: 4320 oz

## 2018-02-10 LAB — BASIC METABOLIC PANEL
Anion gap: 18 — ABNORMAL HIGH (ref 5–15)
Anion gap: 12 (ref 5–15)
BUN: 21 mg/dL — AB (ref 6–20)
BUN: 19 mg/dL (ref 6–20)
CALCIUM: 8.8 mg/dL — AB (ref 8.9–10.3)
CALCIUM: 8.6 mg/dL — AB (ref 8.9–10.3)
CHLORIDE: 98 mmol/L (ref 98–111)
CHLORIDE: 100 mmol/L (ref 98–111)
CO2: 11 mmol/L — AB (ref 22–32)
CO2: 18 mmol/L — AB (ref 22–32)
CREATININE: 1.03 mg/dL (ref 0.61–1.24)
Creatinine, Ser: 1.28 mg/dL — ABNORMAL HIGH (ref 0.61–1.24)
GFR calc Af Amer: >60 mL/min (ref >60)
GFR calc Af Amer: >60 mL/min (ref >60)
GFR calc non Af Amer: 59 mL/min — ABNORMAL LOW (ref >60)
GFR calc non Af Amer: >60 mL/min (ref >60)
GLUCOSE: 172 mg/dL — AB (ref 70–99)
Glucose, Bld: 276 mg/dL — ABNORMAL HIGH (ref 70–99)
POTASSIUM: 4.4 mmol/L (ref 3.5–5.1)
Potassium: 3.8 mmol/L (ref 3.5–5.1)
Sodium: 127 mmol/L — ABNORMAL LOW (ref 135–145)
Sodium: 130 mmol/L — ABNORMAL LOW (ref 135–145)

## 2018-02-10 LAB — BLOOD GAS, VENOUS
ACID-BASE DEFICIT: 18.2 mmol/L — AB (ref 0.0–2.0)
Bicarbonate: 9.2 mmol/L — ABNORMAL LOW (ref 20.0–28.0)
O2 SAT: 52.6 %
PATIENT TEMPERATURE: 37
PATIENT TEMPERATURE: 37
PCO2 VEN: 19 mmHg — AB (ref 44.0–60.0)
PO2 VEN: 81 mmHg — AB (ref 32.0–45.0)
pCO2, Ven: 27 mmHg — ABNORMAL LOW (ref 44.0–60.0)
pH, Ven: 7.18 — CL (ref 7.250–7.430)
pH, Ven: 7.14 — CL (ref 7.250–7.430)
pO2, Ven: 38 mmHg (ref 32.0–45.0)

## 2018-02-10 LAB — COMPREHENSIVE METABOLIC PANEL
ALK PHOS: 112 U/L (ref 38–126)
ALT: 19 U/L (ref 0–44)
ANION GAP: 32 — AB (ref 5–15)
AST: 16 U/L (ref 15–41)
Albumin: 4.5 g/dL (ref 3.5–5.0)
BUN: 23 mg/dL — ABNORMAL HIGH (ref 6–20)
CALCIUM: 9.7 mg/dL (ref 8.9–10.3)
CO2: 9 mmol/L — ABNORMAL LOW (ref 22–32)
Chloride: 87 mmol/L — ABNORMAL LOW (ref 98–111)
Creatinine, Ser: 1.54 mg/dL — ABNORMAL HIGH (ref 0.61–1.24)
GFR calc Af Amer: 55 mL/min — ABNORMAL LOW (ref >60)
GFR calc non Af Amer: 47 mL/min — ABNORMAL LOW (ref >60)
Glucose, Bld: 567 mg/dL (ref 70–99)
Potassium: 4.6 mmol/L (ref 3.5–5.1)
SODIUM: 128 mmol/L — AB (ref 135–145)
TOTAL PROTEIN: 8.6 g/dL — AB (ref 6.5–8.1)
Total Bilirubin: 2.2 mg/dL — ABNORMAL HIGH (ref 0.3–1.2)

## 2018-02-10 LAB — BETA-HYDROXYBUTYRIC ACID

## 2018-02-10 LAB — CBC WITH DIFFERENTIAL/PLATELET
Basophils Absolute: 0 10*3/uL (ref 0–0.1)
Basophils Relative: 0 %
EOS ABS: 0 10*3/uL (ref 0–0.7)
Eosinophils Relative: 0 %
HEMATOCRIT: 51.6 % (ref 40.0–52.0)
HEMOGLOBIN: 17.3 g/dL (ref 13.0–18.0)
LYMPHS ABS: 0.7 10*3/uL — AB (ref 1.0–3.6)
Lymphocytes Relative: 6 %
MCH: 32.1 pg (ref 26.0–34.0)
MCHC: 33.6 g/dL (ref 32.0–36.0)
MCV: 95.5 fL (ref 80.0–100.0)
Monocytes Absolute: 0.4 10*3/uL (ref 0.2–1.0)
Monocytes Relative: 3 %
NEUTROS PCT: 91 %
Neutro Abs: 11 10*3/uL — ABNORMAL HIGH (ref 1.4–6.5)
Platelets: 271 10*3/uL (ref 150–440)
RBC: 5.41 MIL/uL (ref 4.40–5.90)
RDW: 13.6 % (ref 11.5–14.5)
WBC: 12 10*3/uL — ABNORMAL HIGH (ref 3.8–10.6)

## 2018-02-10 LAB — MRSA PCR SCREENING: MRSA by PCR: NEGATIVE

## 2018-02-10 LAB — TROPONIN I
Troponin I: <0.03 ng/mL (ref <0.03)
Troponin I: <0.03 ng/mL (ref <0.03)

## 2018-02-10 LAB — MAGNESIUM: Magnesium: 2.7 mg/dL — ABNORMAL HIGH (ref 1.7–2.4)

## 2018-02-10 MED ORDER — ASPIRIN EC 81 MG PO TBEC
81.0000 mg | DELAYED_RELEASE_TABLET | Freq: Every day | ORAL | Status: DC
  Administered 2018-02-10 – 2018-02-13 (×4): 81 mg via ORAL
  Filled 2018-02-10 (×4): qty 1

## 2018-02-10 MED ORDER — SIMETHICONE 40 MG/0.6ML PO SUSP (UNIT DOSE)
40.0000 mg | Freq: Once | ORAL | Status: AC
  Administered 2018-02-10: 40 mg via ORAL
  Filled 2018-02-10: qty 0.6

## 2018-02-10 MED ORDER — SODIUM CHLORIDE 0.9 % IV SOLN
INTRAVENOUS | Status: DC
  Filled 2018-02-10 (×2): qty 1

## 2018-02-10 MED ORDER — SODIUM CHLORIDE 0.9 % IV SOLN: INTRAVENOUS | Status: AC

## 2018-02-10 MED ORDER — ACETAMINOPHEN 325 MG PO TABS
650.0000 mg | ORAL_TABLET | Freq: Four times a day (QID) | ORAL | Status: DC | PRN
  Administered 2018-02-12: 650 mg via ORAL
  Filled 2018-02-10: qty 2

## 2018-02-10 MED ORDER — SODIUM CHLORIDE 0.9 % IV SOLN
INTRAVENOUS | Status: DC
  Administered 2018-02-10: 15:00:00 via INTRAVENOUS

## 2018-02-10 MED ORDER — SODIUM CHLORIDE 0.9 % IV SOLN
1000.0000 mL | Freq: Once | INTRAVENOUS | Status: AC
  Administered 2018-02-10: 1000 mL via INTRAVENOUS

## 2018-02-10 MED ORDER — SODIUM CHLORIDE 0.9 % IV SOLN
INTRAVENOUS | Status: DC
  Administered 2018-02-10: 4.8 [IU]/h via INTRAVENOUS
  Administered 2018-02-10: 12.4 [IU]/h via INTRAVENOUS
  Filled 2018-02-10 (×3): qty 1

## 2018-02-10 MED ORDER — POTASSIUM CHLORIDE 10 MEQ/100ML IV SOLN
10.0000 meq | INTRAVENOUS | Status: DC
  Administered 2018-02-10: 10 meq via INTRAVENOUS
  Filled 2018-02-10 (×2): qty 100

## 2018-02-10 MED ORDER — HYDRALAZINE HCL 20 MG/ML IJ SOLN: 10.0000 mg | INTRAMUSCULAR | Status: DC | PRN

## 2018-02-10 MED ORDER — INSULIN ASPART 100 UNIT/ML ~~LOC~~ SOLN: 10.0000 [IU] | Freq: Once | SUBCUTANEOUS | Status: DC

## 2018-02-10 MED ORDER — ACETAMINOPHEN 650 MG RE SUPP: 650.0000 mg | Freq: Four times a day (QID) | RECTAL | Status: DC | PRN

## 2018-02-10 MED ORDER — SODIUM CHLORIDE 0.9 % IV SOLN
100.0000 mg | Freq: Two times a day (BID) | INTRAVENOUS | Status: DC
  Administered 2018-02-10 – 2018-02-11 (×2): 100 mg via INTRAVENOUS
  Filled 2018-02-10 (×3): qty 100

## 2018-02-10 MED ORDER — HYDRALAZINE HCL 20 MG/ML IJ SOLN
10.0000 mg | Freq: Once | INTRAMUSCULAR | Status: AC
  Administered 2018-02-10: 10 mg via INTRAVENOUS
  Filled 2018-02-10: qty 1

## 2018-02-10 MED ORDER — IOPAMIDOL (ISOVUE-300) INJECTION 61%
125.0000 mL | Freq: Once | INTRAVENOUS | Status: AC | PRN
  Administered 2018-02-10: 125 mL via INTRAVENOUS

## 2018-02-10 MED ORDER — HEPARIN SODIUM (PORCINE) 5000 UNIT/ML IJ SOLN
5000.0000 [IU] | Freq: Three times a day (TID) | INTRAMUSCULAR | Status: DC
  Administered 2018-02-10 – 2018-02-11 (×3): 5000 [IU] via SUBCUTANEOUS
  Filled 2018-02-10 (×3): qty 1

## 2018-02-10 MED ORDER — INSULIN ASPART 100 UNIT/ML ~~LOC~~ SOLN
5.0000 [IU] | Freq: Once | SUBCUTANEOUS | Status: AC
  Administered 2018-02-10: 5 [IU] via SUBCUTANEOUS
  Filled 2018-02-10: qty 1

## 2018-02-10 MED ORDER — HEPARIN SODIUM (PORCINE) 5000 UNIT/ML IJ SOLN: 5000.0000 [IU] | Freq: Three times a day (TID) | INTRAMUSCULAR | Status: DC

## 2018-02-10 MED ORDER — STERILE WATER FOR INJECTION IV SOLN
INTRAVENOUS | Status: DC
  Administered 2018-02-10 – 2018-02-11 (×3): via INTRAVENOUS
  Filled 2018-02-10 (×4): qty 850

## 2018-02-10 MED ORDER — ONDANSETRON HCL 4 MG/2ML IJ SOLN
4.0000 mg | Freq: Four times a day (QID) | INTRAMUSCULAR | Status: DC | PRN
  Administered 2018-02-10: 4 mg via INTRAVENOUS
  Filled 2018-02-10: qty 2

## 2018-02-10 MED ORDER — LIVING WELL WITH DIABETES BOOK
Freq: Once | Status: AC
  Administered 2018-02-10: 16:00:00
  Filled 2018-02-10: qty 1

## 2018-02-10 MED ORDER — ONDANSETRON HCL 4 MG PO TABS: 4.0000 mg | ORAL_TABLET | Freq: Four times a day (QID) | ORAL | Status: DC | PRN

## 2018-02-10 MED ORDER — NICOTINE 14 MG/24HR TD PT24
14.0000 mg | MEDICATED_PATCH | Freq: Every day | TRANSDERMAL | Status: DC
  Filled 2018-02-10 (×2): qty 1

## 2018-02-10 MED ORDER — METOPROLOL TARTRATE 5 MG/5ML IV SOLN
5.0000 mg | Freq: Four times a day (QID) | INTRAVENOUS | Status: DC
  Administered 2018-02-10 – 2018-02-11 (×3): 5 mg via INTRAVENOUS
  Filled 2018-02-10 (×3): qty 5

## 2018-02-10 MED ORDER — SODIUM BICARBONATE 8.4 % IV SOLN
50.0000 meq | Freq: Once | INTRAVENOUS | Status: AC
  Administered 2018-02-10: 50 meq via INTRAVENOUS
  Filled 2018-02-10: qty 50

## 2018-02-10 MED ORDER — SODIUM CHLORIDE 0.9 % IV SOLN
Freq: Once | INTRAVENOUS | Status: AC
  Administered 2018-02-10: 11:00:00 via INTRAVENOUS

## 2018-02-10 MED ORDER — INSULIN ASPART 100 UNIT/ML ~~LOC~~ SOLN
10.0000 [IU] | Freq: Once | SUBCUTANEOUS | Status: AC
  Administered 2018-02-10: 10 [IU] via INTRAVENOUS
  Filled 2018-02-10: qty 1

## 2018-02-10 MED ORDER — DEXTROSE-NACL 5-0.45 % IV SOLN
INTRAVENOUS | Status: DC
  Administered 2018-02-10 – 2018-02-11 (×2): via INTRAVENOUS

## 2018-02-10 MED ORDER — POLYETHYLENE GLYCOL 3350 17 G PO PACK
17.0000 g | PACK | Freq: Every day | ORAL | Status: DC | PRN
  Administered 2018-02-11 – 2018-02-12 (×2): 17 g via ORAL
  Filled 2018-02-10 (×2): qty 1

## 2018-02-10 NOTE — ED Notes (Signed)
Pt provided with multiple cups of water per Dr. Mayford KnifeWilliams.

## 2018-02-10 NOTE — Progress Notes (Signed)
Spoke with Pharmacy regarding needing bicarb stat. They are in the process of making it. Dr Duanne LimerickSamaan notified of patients venous PH 7.14 CO2 27, and bicarb 9.2. Per Dr Duanne LimerickSamaan need to hang bicarb.

## 2018-02-10 NOTE — ED Notes (Signed)
1 unsuccessful IV attempt by this nurse.

## 2018-02-10 NOTE — Progress Notes (Addendum)
Inpatient Diabetes Program Recommendations  AACE/ADA: New Consensus Statement on Inpatient Glycemic Control (2015)  Target Ranges:  Prepandial:   less than 140 mg/dL      Peak postprandial:   less than 180 mg/dL (1-2 hours)      Critically ill patients:  140 - 180 mg/dL   Results for Jeremy [name], Jeremy [name] (MRN [id number]) as of 02/10/2018 10:53  Ref. Range 02/10/2018 07:37  Sodium Latest Ref Range: 135 - 145 mmol/L 128 (L)  Potassium Latest Ref Range: 3.5 - 5.1 mmol/L 4.6  Chloride Latest Ref Range: 98 - 111 mmol/L 87 (L)  CO2 Latest Ref Range: 22 - 32 mmol/L 9 (L)  Glucose Latest Ref Range: 70 - 99 mg/dL 409567 (HH)  BUN Latest Ref Range: 6 - 20 mg/dL 23 (H)  Creatinine Latest Ref Range: 0.61 - 1.24 mg/dL 8.111.54 (H)  Calcium Latest Ref Range: 8.9 - 10.3 mg/dL 9.7  Anion gap Latest Ref Range: 5 - 15  32 (H)    Admit with: DKA/ New Diagnosis of DM2  History: CABG  Current Orders: IV Insulin Drip per DKA orders       Note patient admitted with DKA and New Diagnosis of DM type 2.  Patient complained of increased urinary frequency, frequent urination at night, increased thirst.  PCP listed as Deanna ArtisHeather Boscia--Not sure when last seen by PCP?  Current Hemoglobin A1c level pending.  Based on A1c results, we can determine what kind of discharge medication regimen patient will need.  Will need basic DM survival skills education by nursing staff.  Plan to order educational materials once settled in room.     --Will follow patient during hospitalization--  Ambrose FinlandJeannine Johnston Nimra Puccinelli RN, MSN, CDE Diabetes Coordinator Inpatient Glycemic Control Team Team Pager: [phone] (8a-5p)

## 2018-02-10 NOTE — H&P (Signed)
[facility] [place] [facility]   PATIENT NAME: Jeremy [name]    MR#:  [id number]  DATE OF BIRTH:  [birth year]  DATE OF ADMISSION:  02/10/2018  PRIMARY CARE PHYSICIAN: Carlean Jews, NP   REQUESTING/REFERRING PHYSICIAN:   CHIEF COMPLAINT:   Chief Complaint  Patient presents with  . Chest Pain  . Hyperglycemia    HISTORY OF PRESENT ILLNESS: Jeremy [name]  is a 62 y.o. male with a known history per below presenting with 3 to 4-day history of ill feeling, generalized weakness, fatigue, vague abdominal/epigastric discomfort-patient states that it feels like gas, patient was brought to the emergency room via EMS, blood sugars noted to be over 400, patient also had complaints of a revision, increased urinary frequency, frequent urination at night, increased thirst, in the emergency room patient was found to be in acute DKA with pH of 7.1, patient evaluated at the bedside, found to be in mild distress, mild lethargy, lab work reviewed, patient is now been admitted for acute DKA, newly diagnosed diabetes mellitus type 2, acute kidney injury, acute hyponatremia/hypochloremia.,  CT abdomen pending at the time.  PAST MEDICAL HISTORY:   Past Medical History:  Diagnosis Date  . Coronary artery disease   . Diabetes mellitus without complication (HCC)   . Hypertension   . S/P CABG x 1     PAST SURGICAL HISTORY:  Past Surgical History:  Procedure Laterality Date  . ANKLE ARTHROSCOPY W/ OPEN REPAIR Right   . CORONARY ARTERY BYPASS GRAFT      SOCIAL HISTORY:  Social History   Tobacco Use  . Smoking status: Current Some Day Smoker    Packs/day: 0.50    Types: Cigarettes  . Smokeless tobacco: Former Engineer, water Use Topics  . Alcohol use: Yes    Alcohol/week: 1.0 standard drinks    Types: 1 Cans of beer per week    FAMILY HISTORY:  Family History  Problem Relation Age of Onset  . Heart attack Father     DRUG ALLERGIES:  Allergies  Allergen  Reactions  . Amoxicillin Swelling    REVIEW OF SYSTEMS:   CONSTITUTIONAL: No fever, +fatigue, weakness.  EYES: No blurred or double vision.  EARS, NOSE, AND THROAT: No tinnitus or ear pain.  RESPIRATORY: No cough,+ shortness of breath, no wheezing or hemoptysis.  CARDIOVASCULAR: No chest pain, orthopnea, edema.  GASTROINTESTINAL: + nausea, abdominal pain.  GENITOURINARY: No dysuria, hematuria.  ENDOCRINE: +polyuria, nocturia,  HEMATOLOGY: No anemia, easy bruising or bleeding SKIN: No rash or lesion. MUSCULOSKELETAL: No joint pain or arthritis.   NEUROLOGIC: No tingling, numbness, weakness.  PSYCHIATRY: No anxiety or depression.   MEDICATIONS AT HOME:  Prior to Admission medications   Medication Sig Start Date End Date Taking? Authorizing Provider  meloxicam (MOBIC) 15 MG tablet Take 15 mg by mouth daily.    [provider]  traMADol (ULTRAM) 50 MG tablet Take 50 mg by mouth every 6 (six) hours as needed.    [provider]      PHYSICAL EXAMINATION:   VITAL SIGNS: Blood pressure (!) 176/93, pulse (!) 101, temperature 97.6 F (36.4 C), temperature source Oral, resp. rate (!) 26, height 5\' 11"  (1.803 m), weight 122.5 kg, SpO2 97 %.  GENERAL:  62 y.o.-year-old patient lying in the bed with no acute distress.  Morbidly obese, nontoxic-appearing EYES: Pupils equal, round, reactive to light and accommodation. No scleral icterus. Extraocular muscles intact.  Dry mucous membranes HEENT: Head atraumatic, normocephalic. Oropharynx  and nasopharynx clear.  Poor skin turgor NECK:  Supple, no jugular venous distention. No thyroid enlargement, no tenderness.  LUNGS: Normal breath sounds bilaterally, no wheezing, rales,rhonchi or crepitation. No use of accessory muscles of respiration.  CARDIOVASCULAR: S1, S2 normal. No murmurs, rubs, or gallops.  ABDOMEN: Soft, nontender, nondistended. Bowel sounds present. No organomegaly or mass.  EXTREMITIES: No pedal edema, cyanosis, or  clubbing.  NEUROLOGIC: Cranial nerves II through XII are intact. Muscle strength 5/5 in all extremities. Sensation intact. Gait not checked.  PSYCHIATRIC: The patient is lethargic, oriented x 3.  SKIN: No obvious rash, lesion, or ulcer.   LABORATORY PANEL:   CBC Recent Labs  Lab 02/10/18 0737  WBC 12.0*  HGB 17.3  HCT 51.6  PLT 271  MCV 95.5  MCH 32.1  MCHC 33.6  RDW 13.6  LYMPHSABS 0.7*  MONOABS 0.4  EOSABS 0.0  BASOSABS 0.0   ------------------------------------------------------------------------------------------------------------------  Chemistries  Recent Labs  Lab 02/10/18 0737  NA 128*  K 4.6  CL 87*  CO2 9*  GLUCOSE 567*  BUN 23*  CREATININE 1.54*  CALCIUM 9.7  AST 16  ALT 19  ALKPHOS 112  BILITOT 2.2*   ------------------------------------------------------------------------------------------------------------------ estimated creatinine clearance is 68 mL/min (A) (by C-G formula based on SCr of 1.54 mg/dL (H)). ------------------------------------------------------------------------------------------------------------------ No results for input(s): TSH, T4TOTAL, T3FREE, THYROIDAB in the last 72 hours.  Invalid input(s): FREET3   Coagulation profile No results for input(s): INR, PROTIME in the last 168 hours. ------------------------------------------------------------------------------------------------------------------- No results for input(s): DDIMER in the last 72 hours. -------------------------------------------------------------------------------------------------------------------  Cardiac Enzymes Recent Labs  Lab 02/10/18 0737  TROPONINI <0.03   ------------------------------------------------------------------------------------------------------------------ Invalid input(s): POCBNP  ---------------------------------------------------------------------------------------------------------------  Urinalysis    Component Value  Date/Time   COLORURINE STRAW (A) 02/10/2018 0737   APPEARANCEUR CLEAR (A) 02/10/2018 0737   LABSPEC 1.022 02/10/2018 0737   PHURINE 5.0 02/10/2018 0737   GLUCOSEU >=500 (A) 02/10/2018 0737   HGBUR MODERATE (A) 02/10/2018 0737   BILIRUBINUR NEGATIVE 02/10/2018 0737   KETONESUR 80 (A) 02/10/2018 0737   PROTEINUR 30 (A) 02/10/2018 0737   NITRITE NEGATIVE 02/10/2018 0737   LEUKOCYTESUR NEGATIVE 02/10/2018 0737     RADIOLOGY: Dg Chest 2 View  Result Date: 02/10/2018 CLINICAL DATA:  Chest pain EXAM: CHEST - 2 VIEW COMPARISON:  10/02/2011 FINDINGS: Cardiac shadow is stable. Postsurgical changes are again seen. The lungs are well aerated bilaterally with mild bibasilar atelectasis. No acute bony abnormality is seen. IMPRESSION: Mild bibasilar atelectasis. Electronically Signed   By: Alcide CleverMark  Lukens M.D.   On: 02/10/2018 09:32    EKG: Orders placed or performed during the hospital encounter of 02/10/18  . EKG 12-Lead  . EKG 12-Lead    IMPRESSION AND PLAN: *Acute DKA *Newly diagnosed uncontrolled diabetes mellitus type 2 *Acute kidney injury due to dehydration *Acute pseudohyponatremia/hypochloremia *History of coronary artery disease *Chronic tobacco smoking abuse/dependency *Morbid obesity  Admit to ICU on our DKA protocol, IV fluids for rehydration, follow-up on CT of the abdomen, rule out acute coronary syndrome with cardiac enzymes x3 sets, diabetic education while in house, n.p.o. for now, follow-up on cultures, avoid nephrotoxic agents, tobacco cessation counseling with nicotine patch daily, complete MAR when available, and continue close medical monitoring  All the records are reviewed and case discussed with ED provider. Management plans discussed with the patient, family and they are in agreement.  CODE STATUS:full   TOTAL TIME TAKING CARE OF THIS PATIENT: 45 minutes.    Evelena AsaMontell D Salary M.D on 02/10/2018  Between 7am to 6pm - Pager - [phone]  After 6pm go to  [email or web address] - password [email or web address]  [facility]  Office  [phone]  CC: Primary care physician; Carlean Jews, NP   Note: This dictation was prepared with Dragon dictation along with smaller phrase technology. Any transcriptional errors that result from this process are unintentional.

## 2018-02-10 NOTE — Progress Notes (Signed)
Inpatient Diabetes Program Recommendations  AACE/ADA: New Consensus Statement on Inpatient Glycemic Control (2015)  Target Ranges:  Prepandial:   less than 140 mg/dL      Peak postprandial:   less than 180 mg/dL (1-2 hours)      Critically ill patients:  140 - 180 mg/dL      Admit with: DKA/ New Diagnosis of DM2  History: CABG  Current Orders: IV Insulin Drip per DKA orders    MD- Please do not transition off the IV Insulin drip until the following criteria have been met: CO2 of 20 or greater Anion Gap 12 or less CBGs stable  Once these criteria have been met, recommend transition to Lantus and Novolog.  Make sure to give Lantus at least 1 hour prior to d/c of IV Insulin drip     Note patient admitted with DKA and New Diagnosis of DM type 2.  Patient complained of increased urinary frequency, frequent urination at night, increased thirst.  PCP listed as Gilda Crease sure when last seen by PCP?  Current Hemoglobin A1c level pending.  Based on A1c results, we can determine what kind of discharge medication regimen patient will need.  Will need basic DM survival skills education by nursing staff.  Plan to order educational materials once settled in room.  Attempted to speak with patient this afternoon (2:30pm) about new diagnosis, however, pt was very restless in bed and could NOT stay awake long enough to have a meaningful conversation.  NOT appropriate for DM education at this time.    If patient is still here on Monday (09/02), will re-attempt to visit with pt then.  If the meantime, once patient appropriate for basic diabetes education, please have nursing staff review basic DM survival skills education.    --Will follow patient during hospitalization--  Wyn Quaker RN, MSN, CDE Diabetes Coordinator Inpatient Glycemic Control Team Team Pager: [phone] (8a-5p)

## 2018-02-10 NOTE — ED Notes (Signed)
Sarah, RN given report.

## 2018-02-10 NOTE — ED Triage Notes (Signed)
Pt arrives from home via EMS. Reporting CP. BGL 413 97.8 142/101 324 ASA given. Hx CABG.

## 2018-02-10 NOTE — ED Provider Notes (Signed)
[facility] Emergency Department Provider Note       Time seen: ----------------------------------------- 7:33 AM on 02/10/2018 -----------------------------------------   I have reviewed the triage vital signs and the nursing notes.  HISTORY   Chief Complaint Chest Pain and Hyperglycemia    HPI Jeremy [name] is a 62 y.o. male with a history of coronary artery disease, hypertension and coronary artery bypass grafting who presents to the ED for sickness for several days.  Patient reports she is generally not been feeling well and is feeling weak.  He arrives from home by EMS.  Blood glucose was in the 400s and he does not have a history of diabetes or hyperglycemia.  He has been urinating frequently.    Past Medical History:  Diagnosis Date  . Coronary artery disease   . Hypertension   . S/P CABG x 1     There are no active problems to display for this patient.   Past Surgical History:  Procedure Laterality Date  . ANKLE ARTHROSCOPY W/ OPEN REPAIR Right   . CORONARY ARTERY BYPASS GRAFT      Allergies Amoxicillin  Social History Social History   Tobacco Use  . Smoking status: Current Some Day Smoker    Packs/day: 0.50    Types: Cigarettes  . Smokeless tobacco: Former Engineer, water Use Topics  . Alcohol use: Yes    Alcohol/week: 1.0 standard drinks    Types: 1 Cans of beer per week  . Drug use: Yes    Types: Marijuana   Review of Systems Constitutional: Negative for fever. Cardiovascular: Negative for chest pain. Respiratory: Positive for difficulty breathing Gastrointestinal: Negative for abdominal pain, vomiting and diarrhea. Genitourinary: Positive for polyuria Musculoskeletal: Negative for back pain. Skin: Negative for rash. Neurological: Positive for weakness  All systems negative/normal/unremarkable except as stated in the HPI  ____________________________________________   PHYSICAL EXAM:  VITAL SIGNS: ED Triage  Vitals  Enc Vitals Group     BP 02/10/18 0726 (!) 154/86     Pulse Rate 02/10/18 0726 97     Resp 02/10/18 0726 (!) 24     Temp 02/10/18 0726 97.6 F (36.4 C)     Temp Source 02/10/18 0726 Oral     SpO2 02/10/18 0726 94 %     Weight 02/10/18 0725 270 lb (122.5 kg)     Height 02/10/18 0725 5\' 11"  (1.803 m)     Head Circumference --      Peak Flow --      Pain Score 02/10/18 0724 0     Pain Loc --      Pain Edu? --      Excl. in GC? --    Constitutional: Alert and oriented.  Mild to moderate distress Eyes: Conjunctivae are normal. Normal extraocular movements. ENT   Head: Normocephalic and atraumatic.   Nose: No congestion/rhinnorhea.   Mouth/Throat: Mucous membranes are dry   Neck: No stridor. Cardiovascular: Normal rate, regular rhythm. No murmurs, rubs, or gallops. Respiratory: Tachypnea and apparent coo small respirations Gastrointestinal: Soft and nontender. Normal bowel sounds Musculoskeletal: Nontender with normal range of motion in extremities. No lower extremity tenderness nor edema. Neurologic:  Normal speech and language. No gross focal neurologic deficits are appreciated.  Skin:  Skin is warm, dry and intact. No rash noted. Psychiatric: Mood and affect are normal. ____________________________________________  EKG: Interpreted by me.  Sinus rhythm the rate of 97 bpm, normal QRS size, long QT, normal axis, likely LVH  ____________________________________________  ED COURSE:  As part of my medical decision making, I reviewed the following data within the electronic MEDICAL RECORD NUMBER History obtained from family if available, nursing notes, old chart and ekg, as well as notes from prior ED visits. Patient presented for weakness, hyperglycemia and dyspnea, we will assess with labs and imaging as indicated at this time.  Patient has concerning symptomatology for DKA   Procedures ____________________________________________   LABS (pertinent  positives/negatives)  Labs Reviewed  CBC WITH DIFFERENTIAL/PLATELET - Abnormal; Notable for the following components:      Result Value   WBC 12.0 (*)    Neutro Abs 11.0 (*)    Lymphs Abs 0.7 (*)    All other components within normal limits  COMPREHENSIVE METABOLIC PANEL - Abnormal; Notable for the following components:   Sodium 128 (*)    Chloride 87 (*)    CO2 9 (*)    Glucose, Bld 567 (*)    BUN 23 (*)    Creatinine, Ser 1.54 (*)    Total Protein 8.6 (*)    Total Bilirubin 2.2 (*)    GFR calc non Af Amer 47 (*)    GFR calc Af Amer 55 (*)    Anion gap 32 (*)    All other components within normal limits  URINALYSIS, COMPLETE (UACMP) WITH MICROSCOPIC - Abnormal; Notable for the following components:   Color, Urine STRAW (*)    APPearance CLEAR (*)    Glucose, UA >=500 (*)    Hgb urine dipstick MODERATE (*)    Ketones, ur 80 (*)    Protein, ur 30 (*)    All other components within normal limits  BLOOD GAS, VENOUS - Abnormal; Notable for the following components:   pH, Ven 7.18 (*)    pCO2, Ven 19 (*)    pO2, Ven 81.0 (*)    All other components within normal limits  GLUCOSE, CAPILLARY - Abnormal; Notable for the following components:   Glucose-Capillary 451 (*)    All other components within normal limits  GLUCOSE, CAPILLARY - Abnormal; Notable for the following components:   Glucose-Capillary 537 (*)    All other components within normal limits  TROPONIN I  BETA-HYDROXYBUTYRIC ACID  CBG MONITORING, ED   CRITICAL CARE Performed by: Ulice DashJohnathan E Williams   Total critical care time: 30 minutes  Critical care time was exclusive of separately billable procedures and treating other patients.  Critical care was necessary to treat or prevent imminent or life-threatening deterioration.  Critical care was time spent personally by me on the following activities: development of treatment plan with patient and/or surrogate as well as nursing, discussions with consultants,  evaluation of patient's response to treatment, examination of patient, obtaining history from patient or surrogate, ordering and performing treatments and interventions, ordering and review of laboratory studies, ordering and review of radiographic studies, pulse oximetry and re-evaluation of patient's condition.  RADIOLOGY Images were viewed by me  Chest x-ray IMPRESSION: Mild bibasilar atelectasis. ____________________________________________  DIFFERENTIAL DIAGNOSIS   Dehydration, electrolyte abnormality, DKA, HH NK, sepsis  FINAL ASSESSMENT AND PLAN  DKA   Plan: The patient had presented for weakness and was noted to be hyperglycemic. Patient's labs did indicate numerous abnormalities and were concerning for DKA.  He was started on insulin drip after receiving IV fluids and insulin bolus of 10 units.  He is still tachypneic and having few small respirations.  Patient's imaging not reveal any acute process.  I will discuss with the hospitalist for  ICU admission.   Ulice Dash, MD   Note: This note was generated in part or whole with voice recognition software. Voice recognition is usually quite accurate but there are transcription errors that can and very often do occur. I apologize for any typographical errors that were not detected and corrected.     Emily Filbert, MD 02/10/18 [phone]

## 2018-02-10 NOTE — Progress Notes (Signed)
Per Dr Duanne LimerickSamaan stop ns 150 ml/hr, start d5 1/2 ns per protocol 100 ml/hr (blood sugar 240). He is aware of bicarb going at 100 ml/hr. At this point fluid overload not a concern. He was made aware of doxy antibiotics going at 125 ml/hr as well.

## 2018-02-10 NOTE — Consult Note (Addendum)
   Name: Sarita BottomMarshall Spratlin MRN: [id number] DOB: Jan 04, 1956     CONSULTATION DATE: 02/10/2018    HISTORY OF PRESENT ILLNESS:  62 years old male with history of coronary artery disease status post CABG x1, hypertension.  Patient presented to the ED because of lethargy, nausea and abdominal pain for the last 48 hours.  He was found to have severe anion gap metabolic acidosis due to DKA and new onset diabetes mellitus.  Patient received a fluid bolus in the ED and was started on insulin drip. Patient arrived to the ICU awake, lethargic, on nasal cannula and insulin drip, saline at 150 mm/h.  No distress All history was obtained from admitting physician, the patient and EMR.  PAST MEDICAL HISTORY :   has a past medical history of Coronary artery disease, Diabetes mellitus without complication (HCC), Hypertension, and S/P CABG x 1.  has a past surgical history that includes Ankle arthroscopy w/ open repair (Right) and Coronary artery bypass graft. Prior to Admission medications   Not on File   Allergies  Allergen Reactions  . Amoxicillin Swelling    FAMILY HISTORY:  family history includes Heart attack in his father. SOCIAL HISTORY:  reports that he has been smoking cigarettes. He has been smoking about 0.50 packs per day. He has quit using smokeless tobacco. He reports that he drinks about 1.0 standard drinks of alcohol per week. He reports that he has current or past drug history. Drug: Marijuana.  REVIEW OF SYSTEMS:   Unable to obtain due to critical illness   VITAL SIGNS: Temp:  [97.6 F (36.4 C)-98.6 F (37 C)] 98.6 F (37 C) (08/30 1431) Pulse Rate:  [88-101] 91 (08/30 1711) Resp:  [12-26] 12 (08/30 1711) BP: (154-194)/(75-156) 167/86 (08/30 1711) SpO2:  [94 %-100 %] 97 % (08/30 1711) Weight:  [122.5 kg] 122.5 kg (08/30 0725)  Physical Examination:  Lethargic, oriented moving all extremities.  Nonfocal neuro exam Tolerating nasal cannula, talking in full sentences, no  distress, bilateral equal air entry and no adventitious sounds S1 & S2 are audible with no murmur Benign abdominal exam with normal peristalsis. No leg edema   ASSESSMENT / PLAN:   Anion gap metabolic acidosis with DKA  -Optimize bicarb solution, insulin drip, monitor anion gap, renal panel which  Altered mental status with toxic metabolic encephalopathy (lethargy).  Nonfocal neurological exam -Monitor neuro status  New onset diabetes mellitus -Symptom control and monitor hemoglobin A1c  Uncontrolled hypertension -Optimize antihypertensives and monitor hemodynamics  Pseudo-hyponatremia with hyperglycemia -Glycemic control and monitor serum electrolytes  AKI with intravascular volume depletion -Optimize hydration, avoid nephrotoxins, monitor renal panel and urine output  Diabetic gastropathy -Reglan  Atelectasis and pneumonia.  Left lower airspace disease -Empiric doxycycline.  Monitor CXR + CBC + FiO2.  CAD status post CABG.  Inferior ischemia on EKG when compared to 2 study done on December 2018, no chest pain, normal initial troponin -Aspirin and follow with echocardiogram  Full code  Supportive care Plan of care was discussed with the patient and the wife at the bedside  Critical care time 45 minutes

## 2018-02-10 NOTE — ED Notes (Signed)
Pt very upset with this nurse because immediately upon arrival pt began asking for juice or sprite. Pt informed he cannot have these and pt brought water instead. Pt asking other staff members for juice or sprite and pt very rude with this nurse. Educated pt on why he cannot have anything except water right now due to his elevated blood sugar.

## 2018-02-10 NOTE — ED Notes (Signed)
Lab called for repeat troponin collection.

## 2018-02-10 NOTE — Progress Notes (Signed)
Md notified of patients blood pressure despite of PRN hydralazine. He will review and place orders.

## 2018-02-10 NOTE — Progress Notes (Signed)
Family Meeting Note  Advance Directive:yes  Today a meeting took place with the Patient.  Patient is able to participate    The following clinical team members were present during this meeting:MD  The following were discussed:Patient's diagnosis: Acute DKA, uncontrolled diabetes mellitus type 2, Patient's progosis: Unable to determine and Goals for treatment: Full Code  Additional follow-up to be provided: prn  Time spent during discussion:20 minutes  Bertrum SolMontell D Neithan Day, MD

## 2018-02-10 NOTE — Progress Notes (Signed)
Brief Nutrition Education Note  RD consulted for nutrition education regarding diabetes.   Jeremy [name] admitted with DKA and new DM diagnosis.   Lab Results  Component Value Date   HGBA1C 6.2 10/03/2011     Labs reviewed: CBGS: 422-537 (inpatient orders for glycemic control are insulin drip- most recent dose 7.2 units insulin regular/hr).   Reviewed DM coordinator note; Jeremy [name] is not appropriate for education at this time (DM coordinator has just seen Jeremy [name] and reports that Jeremy [name] would not stay awake long enough for any meaningful conversation.   RD did not educate today, but will follow-up when Jeremy [name] is more appropriate for education and/or when family is present.   [place]y [name] resides in [place]- will order outpatient DM education for further reinforcement after hospital discharge.   Body mass index is 37.66 kg/m. Jeremy [name] meets criteria for obesity, class II based on current BMI.  Current diet order is NPO, patient is consuming approximately 0% of meals at this time. Labs and medications reviewed.   Trent Theisen A. Mayford KnifeWilliams, RD, LDN, CDE Pager: [phone] After hours Pager: [phone]

## 2018-02-10 NOTE — Progress Notes (Signed)
*  PRELIMINARY RESULTS* Echocardiogram 2D Echocardiogram has been performed.  Jeremy [name] 02/10/2018, 10:19 PM

## 2018-02-10 NOTE — ED Notes (Signed)
Pt continues to remove himself from monitor cords. Pt asked to please stop removing these cords.

## 2018-02-10 NOTE — ED Notes (Addendum)
Date and time results received: 02/10/18 0933  Test: Glucose Critical Value: 567  Name of Provider Notified: Mayford KnifeWilliams  Orders Received? Or Actions Taken?: will continue to monitor

## 2018-02-10 NOTE — Progress Notes (Signed)
Called Dr Duanne LimerickSamaan regarding patients blood pressure. Orders will be placed and MD will be coming up to assess patient

## 2018-02-11 DIAGNOSIS — L8990 Pressure ulcer of unspecified site, unspecified stage: Secondary | ICD-10-CM

## 2018-02-11 LAB — MAGNESIUM
MAGNESIUM: 2.4 mg/dL (ref 1.7–2.4)
MAGNESIUM: 2.6 mg/dL — AB (ref 1.7–2.4)
MAGNESIUM: 2.5 mg/dL — AB (ref 1.7–2.4)
Magnesium: 2.3 mg/dL (ref 1.7–2.4)

## 2018-02-11 LAB — GLUCOSE, CAPILLARY
GLUCOSE-CAPILLARY: 143 mg/dL — AB (ref 70–99)
GLUCOSE-CAPILLARY: 150 mg/dL — AB (ref 70–99)
GLUCOSE-CAPILLARY: 138 mg/dL — AB (ref 70–99)
GLUCOSE-CAPILLARY: 167 mg/dL — AB (ref 70–99)
GLUCOSE-CAPILLARY: 298 mg/dL — AB (ref 70–99)
GLUCOSE-CAPILLARY: 256 mg/dL — AB (ref 70–99)
Glucose-Capillary: 126 mg/dL — ABNORMAL HIGH (ref 70–99)
Glucose-Capillary: 159 mg/dL — ABNORMAL HIGH (ref 70–99)
Glucose-Capillary: 230 mg/dL — ABNORMAL HIGH (ref 70–99)
Glucose-Capillary: 276 mg/dL — ABNORMAL HIGH (ref 70–99)

## 2018-02-11 LAB — CBC
HCT: 38.8 % — ABNORMAL LOW (ref 40.0–52.0)
Hemoglobin: 13.7 g/dL (ref 13.0–18.0)
MCH: 32 pg (ref 26.0–34.0)
MCHC: 35.4 g/dL (ref 32.0–36.0)
MCV: 90.4 fL (ref 80.0–100.0)
PLATELETS: 224 10*3/uL (ref 150–440)
RBC: 4.29 MIL/uL — ABNORMAL LOW (ref 4.40–5.90)
RDW: 13 % (ref 11.5–14.5)
WBC: 9.2 10*3/uL (ref 3.8–10.6)

## 2018-02-11 LAB — BASIC METABOLIC PANEL
ANION GAP: 14 (ref 5–15)
ANION GAP: 14 (ref 5–15)
ANION GAP: 13 (ref 5–15)
Anion gap: 12 (ref 5–15)
BUN: 14 mg/dL (ref 6–20)
BUN: 13 mg/dL (ref 6–20)
BUN: 17 mg/dL (ref 6–20)
BUN: 11 mg/dL (ref 6–20)
CALCIUM: 8.3 mg/dL — AB (ref 8.9–10.3)
CALCIUM: 8.4 mg/dL — AB (ref 8.9–10.3)
CHLORIDE: 100 mmol/L (ref 98–111)
CO2: 21 mmol/L — ABNORMAL LOW (ref 22–32)
CO2: 18 mmol/L — ABNORMAL LOW (ref 22–32)
CO2: 19 mmol/L — ABNORMAL LOW (ref 22–32)
CO2: 20 mmol/L — AB (ref 22–32)
CREATININE: 0.82 mg/dL (ref 0.61–1.24)
CREATININE: 0.93 mg/dL (ref 0.61–1.24)
Calcium: 8.5 mg/dL — ABNORMAL LOW (ref 8.9–10.3)
Calcium: 8.3 mg/dL — ABNORMAL LOW (ref 8.9–10.3)
Chloride: 99 mmol/L (ref 98–111)
Chloride: 97 mmol/L — ABNORMAL LOW (ref 98–111)
Chloride: 98 mmol/L (ref 98–111)
Creatinine, Ser: 0.93 mg/dL (ref 0.61–1.24)
Creatinine, Ser: 0.87 mg/dL (ref 0.61–1.24)
GFR calc Af Amer: >60 mL/min (ref >60)
GFR calc non Af Amer: >60 mL/min (ref >60)
GLUCOSE: 228 mg/dL — AB (ref 70–99)
GLUCOSE: 125 mg/dL — AB (ref 70–99)
Glucose, Bld: 313 mg/dL — ABNORMAL HIGH (ref 70–99)
Glucose, Bld: 269 mg/dL — ABNORMAL HIGH (ref 70–99)
POTASSIUM: 3.5 mmol/L (ref 3.5–5.1)
Potassium: 3.9 mmol/L (ref 3.5–5.1)
Potassium: 3.4 mmol/L — ABNORMAL LOW (ref 3.5–5.1)
Potassium: 3.4 mmol/L — ABNORMAL LOW (ref 3.5–5.1)
SODIUM: 130 mmol/L — AB (ref 135–145)
SODIUM: 131 mmol/L — AB (ref 135–145)
Sodium: 133 mmol/L — ABNORMAL LOW (ref 135–145)
Sodium: 131 mmol/L — ABNORMAL LOW (ref 135–145)

## 2018-02-11 LAB — CALCIUM, IONIZED: Calcium, Ionized, Serum: 4.5 mg/dL (ref 4.5–5.6)

## 2018-02-11 LAB — PHOSPHORUS
PHOSPHORUS: 2.7 mg/dL (ref 2.5–4.6)
PHOSPHORUS: 1.8 mg/dL — AB (ref 2.5–4.6)
Phosphorus: 1.7 mg/dL — ABNORMAL LOW (ref 2.5–4.6)
Phosphorus: 1.9 mg/dL — ABNORMAL LOW (ref 2.5–4.6)

## 2018-02-11 LAB — BLOOD GAS, VENOUS
FIO2: 21
Patient temperature: 37
pCO2, Ven: 19 mmHg — CL (ref 44.0–60.0)
pH, Ven: 7.54 — ABNORMAL HIGH (ref 7.250–7.430)
pO2, Ven: 189 mmHg — ABNORMAL HIGH (ref 32.0–45.0)

## 2018-02-11 LAB — HIV ANTIBODY (ROUTINE TESTING W REFLEX): HIV Screen 4th Generation wRfx: NONREACTIVE

## 2018-02-11 MED ORDER — METOPROLOL TARTRATE 25 MG PO TABS
25.0000 mg | ORAL_TABLET | Freq: Two times a day (BID) | ORAL | Status: DC
  Administered 2018-02-11 – 2018-02-13 (×5): 25 mg via ORAL
  Filled 2018-02-11 (×5): qty 1

## 2018-02-11 MED ORDER — INSULIN ASPART 100 UNIT/ML ~~LOC~~ SOLN
0.0000 [IU] | SUBCUTANEOUS | Status: DC
  Administered 2018-02-11 (×2): 8 [IU] via SUBCUTANEOUS
  Administered 2018-02-11: 5 [IU] via SUBCUTANEOUS
  Administered 2018-02-11: 8 [IU] via SUBCUTANEOUS
  Administered 2018-02-11: 3 [IU] via SUBCUTANEOUS
  Administered 2018-02-12 (×2): 5 [IU] via SUBCUTANEOUS
  Filled 2018-02-11 (×8): qty 1

## 2018-02-11 MED ORDER — SODIUM CHLORIDE 0.9 % IV SOLN
INTRAVENOUS | Status: DC
  Administered 2018-02-11 – 2018-02-12 (×2): via INTRAVENOUS

## 2018-02-11 MED ORDER — POTASSIUM PHOSPHATES 15 MMOLE/5ML IV SOLN
30.0000 mmol | Freq: Once | INTRAVENOUS | Status: AC
  Administered 2018-02-11: 30 mmol via INTRAVENOUS
  Filled 2018-02-11: qty 10

## 2018-02-11 MED ORDER — INSULIN GLARGINE 100 UNIT/ML ~~LOC~~ SOLN
10.0000 [IU] | Freq: Every day | SUBCUTANEOUS | Status: DC
  Filled 2018-02-11: qty 0.1

## 2018-02-11 MED ORDER — SODIUM CHLORIDE 0.9 % IV SOLN
INTRAVENOUS | Status: DC | PRN
  Administered 2018-02-11: 250 mL via INTRAVENOUS

## 2018-02-11 MED ORDER — DOXYCYCLINE HYCLATE 100 MG PO TABS
100.0000 mg | ORAL_TABLET | Freq: Two times a day (BID) | ORAL | Status: DC
  Administered 2018-02-11 – 2018-02-13 (×4): 100 mg via ORAL
  Filled 2018-02-11 (×4): qty 1

## 2018-02-11 MED ORDER — INSULIN GLARGINE 100 UNIT/ML ~~LOC~~ SOLN
10.0000 [IU] | Freq: Every day | SUBCUTANEOUS | Status: DC
  Administered 2018-02-11: 10 [IU] via SUBCUTANEOUS
  Filled 2018-02-11 (×2): qty 0.1

## 2018-02-11 MED ORDER — LACTATED RINGERS IV SOLN
INTRAVENOUS | Status: DC
  Administered 2018-02-11: 08:00:00 via INTRAVENOUS

## 2018-02-11 MED ORDER — ALUM & MAG HYDROXIDE-SIMETH 200-200-20 MG/5ML PO SUSP
30.0000 mL | Freq: Four times a day (QID) | ORAL | Status: DC | PRN
  Administered 2018-02-12: 30 mL via ORAL
  Filled 2018-02-11 (×2): qty 30

## 2018-02-11 MED ORDER — SODIUM PHOSPHATES 45 MMOLE/15ML IV SOLN
30.0000 mmol | Freq: Once | INTRAVENOUS | Status: AC
  Administered 2018-02-11: 30 mmol via INTRAVENOUS
  Filled 2018-02-11: qty 10

## 2018-02-11 MED ORDER — PANTOPRAZOLE SODIUM 40 MG PO TBEC
40.0000 mg | DELAYED_RELEASE_TABLET | Freq: Every day | ORAL | Status: DC
  Administered 2018-02-11 – 2018-02-12 (×2): 40 mg via ORAL
  Filled 2018-02-11 (×4): qty 1

## 2018-02-11 MED ORDER — ENOXAPARIN SODIUM 40 MG/0.4ML ~~LOC~~ SOLN
40.0000 mg | SUBCUTANEOUS | Status: DC
  Administered 2018-02-11 – 2018-02-12 (×2): 40 mg via SUBCUTANEOUS
  Filled 2018-02-11 (×2): qty 0.4

## 2018-02-11 NOTE — Progress Notes (Signed)
CH) received an OR for (AD) creation, (CH) presented to the ICU room of Jeremy [name], pastoral presence ensued. He was awake and alert, but seemingly a little disorientated. I shared with him the reason for my visit, A visit to educate and possibly complete an (AD) that he didn't drive. I inquired about his wife's were abouts and he stated she was at work but would be returning later in the day. I inquired did he have any additional concerns, needs or questions. He shared with me his physical affliction, I offered prayer for comfort and spiritual support. Prayer commenced and was followed by brief (AD) education, but the patient seemed distracted and not fully attentive. Additional (AD) education should commence in the company of his wife, Patient's lucidity is fluctuant at this present time, nor is the request for an (AD), patient driven. Jeremy [name] reportedly had Anion gap metabolic acidosis with DKA, He was treated with bicarb solution, insulin drip, monitor anion gap, renal panel which Altered mental status with toxic metabolic encephalopathy (lethargy).  Non-focal neurological exam   02/11/18 1000  Clinical Encounter Type  Visited With Patient  Visit Type Initial;Spiritual support;Other (Comment)  Referral From Physician (AD education)  Consult/Referral To Chaplain  Spiritual Encounters  Spiritual Needs Prayer;Emotional  Stress Factors  Patient Stress Factors Health changes  Family Stress Factors Health changes  Advance Directives (For Healthcare)  Does Patient Have a Medical Advance Directive? No  Would patient like information on creating a medical advance directive? Yes (Inpatient - patient defers creating a medical advance directive at this time)  Mental Health Advance Directives  Does Patient Have a Mental Health Advance Directive? No  Would patient like information on creating a mental health advance directive? No - Patient declined

## 2018-02-11 NOTE — Progress Notes (Signed)
Maggie, NP notified of anion gap at 12,  CO2 at 21 and Phosphorus at 1.7; acknowledged and awaiting DKA phase II orders to be entered.

## 2018-02-11 NOTE — Progress Notes (Signed)
MEDICATION RELATED CONSULT NOTE - INITIAL   Pharmacy Consult for electrolyte management Indication: hypophosphatemia  Allergies  Allergen Reactions  . Amoxicillin Swelling    Patient Measurements: Height: 5\' 11"  (180.3 cm) Weight: 270 lb (122.5 kg) IBW/kg (Calculated) : 75.3 Adjusted Body Weight:   Vital Signs: Temp: 98.1 F (36.7 C) (08/31 0700) BP: 144/72 (08/31 1300) Pulse Rate: 73 (08/31 1300) Intake/Output from previous day: 08/30 0701 - 08/31 0700 In: 3823.1 [I.V.:3553; IV Piggyback:270.1] Out: 1925 [Urine:1925] Intake/Output from this shift: No intake/output data recorded.  Labs: Recent Labs    02/10/18 0737  02/10/18 1732 02/10/18 2213 02/11/18 0050 02/11/18 0808  WBC 12.0*  --   --   --   --  9.2  HGB 17.3  --   --   --   --  13.7  HCT 51.6  --   --   --   --  38.8*  PLT 271  --   --   --   --  224  CREATININE 1.54*   < >  --  1.03 0.93 0.82  MG  --   --  2.7*  --  2.6* 2.5*  PHOS  --   --  3.0  --  1.7* 1.9*  ALBUMIN 4.5  --   --   --   --   --   PROT 8.6*  --   --   --   --   --   AST 16  --   --   --   --   --   ALT 19  --   --   --   --   --   ALKPHOS 112  --   --   --   --   --   BILITOT 2.2*  --   --   --   --   --    < > = values in this interval not displayed.   Estimated Creatinine Clearance: 127.6 mL/min (by C-G formula based on SCr of 0.82 mg/dL).   Microbiology: Recent Results (from the past 720 hour(s))  MRSA PCR Screening     Status: None   Collection Time: 02/10/18  7:59 PM  Result Value Ref Range Status   MRSA by PCR NEGATIVE NEGATIVE Final    Comment:        The GeneXpert MRSA Assay (FDA approved for NASAL specimens only), is one component of a comprehensive MRSA colonization surveillance program. It is not intended to diagnose MRSA infection nor to guide or monitor treatment for MRSA infections. Performed at [facility], [address] [place], [place] [address]     Medical History: Past Medical History:   Diagnosis Date  . Coronary artery disease   . Diabetes mellitus without complication (HCC)   . Hypertension   . S/P CABG x 1     Medications:  Infusions:  . lactated ringers 125 mL/hr at 02/11/18 0736  .  sodium bicarbonate (isotonic) infusion in sterile water 50 mL/hr at 02/11/18 0737    Assessment: 61 yom cc CP and hyperglycemia. Was on insulin infusion for DKA which has now been switched to Lantus. Pharmacy consulted to help manage electrolyte replacement.  Goal of Therapy:  K 3.5 to 5 Ca 8.9 to 10.3 Mg 1.7 to 2.4 Phos 2.5 to 4.6  Plan:  Sodium phosphate infusion this morning started at 06:50. K was 3.4 but no supplement ordered, Mg 2.5, Ca 8.3. Repeat labs ordered for this afternoon. Will follow  up repeat labs before ordering any more supplement.  Carola Frost, Pharm.D., BCPS Clinical Pharmacist 02/11/2018,2:28 PM

## 2018-02-11 NOTE — Progress Notes (Signed)
[facility] - [place] at [facility]   PATIENT NAME: Jeremy [name]    MR#:  [id number]  DATE OF BIRTH:  [birth year]  SUBJECTIVE:  CHIEF COMPLAINT:   Chief Complaint  Patient presents with  . Chest Pain  . Hyperglycemia   -Stented with generalized weakness and fatigue and noted to have DKA.  No known history of diabetes mellitus -Off insulin drip at this time.  REVIEW OF SYSTEMS:  Review of Systems  Constitutional: Negative for chills, fever and malaise/fatigue.  HENT: Negative for congestion, ear discharge, hearing loss and nosebleeds.   Eyes: Negative for blurred vision and double vision.  Respiratory: Negative for cough, shortness of breath and wheezing.   Cardiovascular: Negative for chest pain and palpitations.  Gastrointestinal: Positive for abdominal pain and heartburn. Negative for constipation, diarrhea, nausea and vomiting.  Genitourinary: Negative for dysuria.  Musculoskeletal: Negative for myalgias.  Neurological: Positive for tremors. Negative for dizziness, focal weakness, seizures, weakness and headaches.  Psychiatric/Behavioral: Negative for depression.    DRUG ALLERGIES:   Allergies  Allergen Reactions  . Amoxicillin Swelling    VITALS:  Blood pressure (!) 102/52, pulse 75, temperature 98.1 F (36.7 C), resp. rate 18, height 5\' 11"  (1.803 m), weight 122.5 kg, SpO2 96 %.  PHYSICAL EXAMINATION:  Physical Exam  GENERAL:  62 y.o.-year-old obese patient lying in the bed with no acute distress.  EYES: Pupils equal, round, reactive to light and accommodation. No scleral icterus. Extraocular muscles intact.  HEENT: Head atraumatic, normocephalic. Oropharynx and nasopharynx clear.  NECK:  Supple, no jugular venous distention. No thyroid enlargement, no tenderness.  LUNGS: Normal breath sounds bilaterally, no wheezing, rales,rhonchi or crepitation. No use of accessory muscles of respiration.  Decreased bibasilar breath  sounds CARDIOVASCULAR: S1, S2 normal. No murmurs, rubs, or gallops.  ABDOMEN: Soft, nontender, nondistended. Bowel sounds present. No organomegaly or mass.  EXTREMITIES: No pedal edema, cyanosis, or clubbing.  Involuntary movements of both hands noted NEUROLOGIC: Cranial nerves II through XII are intact. Muscle strength 5/5 in all extremities. Sensation intact. Gait not checked.  PSYCHIATRIC: The patient is alert and oriented x 3.  Flat affect  sKIN: No obvious rash, lesion, or ulcer.    LABORATORY PANEL:   CBC Recent Labs  Lab 02/11/18 0808  WBC 9.2  HGB 13.7  HCT 38.8*  PLT 224   ------------------------------------------------------------------------------------------------------------------  Chemistries  Recent Labs  Lab 02/10/18 0737  02/11/18 0808  NA 128*   < > 131*  K 4.6   < > 3.4*  CL 87*   < > 99  CO2 9*   < > 18*  GLUCOSE 567*   < > 228*  BUN 23*   < > 14  CREATININE 1.54*   < > 0.82  CALCIUM 9.7   < > 8.3*  MG  --    < > 2.5*  AST 16  --   --   ALT 19  --   --   ALKPHOS 112  --   --   BILITOT 2.2*  --   --    < > = values in this interval not displayed.   ------------------------------------------------------------------------------------------------------------------  Cardiac Enzymes Recent Labs  Lab 02/10/18 2213  TROPONINI <0.03   ------------------------------------------------------------------------------------------------------------------  RADIOLOGY:  Dg Chest 2 View  Result Date: 02/10/2018 CLINICAL DATA:  Chest pain EXAM: CHEST - 2 VIEW COMPARISON:  10/02/2011 FINDINGS: Cardiac shadow is stable. Postsurgical changes are again seen. The lungs are well aerated bilaterally with mild bibasilar  atelectasis. No acute bony abnormality is seen. IMPRESSION: Mild bibasilar atelectasis. Electronically Signed   By: Alcide CleverMark  Lukens M.D.   On: 02/10/2018 09:32   Ct Abdomen Pelvis W Contrast  Result Date: 02/10/2018 CLINICAL DATA:  Generalized abdominal  and pelvic pain. Hyperglycemia. EXAM: CT ABDOMEN AND PELVIS WITH CONTRAST TECHNIQUE: Multidetector CT imaging of the abdomen and pelvis was performed using the standard protocol following bolus administration of intravenous contrast. CONTRAST:  125mL ISOVUE-300 IOPAMIDOL (ISOVUE-300) INJECTION 61% COMPARISON:  None. FINDINGS: Lower chest: Lungs are clear. No pleural or pericardial fluid. Extensive coronary artery calcification noted. Hepatobiliary: Normal Pancreas: Normal Spleen: Normal Adrenals/Urinary Tract: Adrenal glands are normal. Kidneys are normal. Bladder is normal. Stomach/Bowel: Distended stomach suggesting gastroparesis. No obstructing lesion identified. Intestine otherwise appears normal. Sigmoid diverticulosis without evidence of diverticulitis. Vascular/Lymphatic: Aortic atherosclerosis. Maximal diameter just proximal to the bifurcation is 2.7 cm. IVC is normal. No retroperitoneal adenopathy. Reproductive: Normal Other: No free fluid or air. Musculoskeletal: Ordinary lumbar degenerative changes. IMPRESSION: Distension of the stomach, most consistent with diabetic gastroparesis. Aortic atherosclerosis.  Coronary artery calcification. Electronically Signed   By: Paulina FusiMark  Shogry M.D.   On: 02/10/2018 11:08    EKG:   Orders placed or performed during the hospital encounter of 02/10/18  . EKG 12-Lead  . EKG 12-Lead    ASSESSMENT AND PLAN:   62 year old male with past medical history significant for CAD status post CABG, hypertension not in any medications at home brought in secondary to fatigue and noted to have DKA  1.  Diabetic ketoacidosis-on any medications at home. -Started on insulin drip.  Anion gap closed -Sugars are still elevated.  Currently on Lantus daily, adjust the dose. -Check A1c.  Diabetes coordinator consult  2.  GERD symptoms-started on Protonix and added Maalox.  Doxycycline might not be a good choice of medication with that  3.  Hypertension-currently on  metoprolol  4.  Acute kidney injury-resolved with IV fluids  5.  Left lower lobe pneumonia-started on doxycycline for now.  Continue to monitor  6.  CAD-status post CABG no active symptoms at this time.  Aspirin if able to tolerate with his reflux symptoms now.  Physical therapy consult once transferred to the floor   All the records are reviewed and case discussed with Care Management/Social Workerr. Management plans discussed with the patient, family and they are in agreement.  CODE STATUS: Full code  TOTAL TIME TAKING CARE OF THIS PATIENT: 39 minutes.   POSSIBLE D/C IN 1-2 DAYS, DEPENDING ON CLINICAL CONDITION.   Enid BaasKALISETTI,Hollyn Stucky M.D on 02/11/2018 at 11:21 AM  Between 7am to 6pm - Pager - [phone]  After 6pm go to [email or web address] - Social research officer, governmentpassword EPAS [email or web address]  [facility] [place] [facility]  Office  [phone]  CC: Primary care physician; Carlean JewsBoscia, Heather E, NP

## 2018-02-11 NOTE — Progress Notes (Signed)
MEDICATION RELATED CONSULT NOTE - INITIAL   Pharmacy Consult for electrolyte management Indication: hypophosphatemia  Allergies  Allergen Reactions  . Amoxicillin Swelling    Patient Measurements: Height: 5\' 11"  (180.3 cm) Weight: 270 lb (122.5 kg) IBW/kg (Calculated) : 75.3 Adjusted Body Weight:   Vital Signs: Temp: 98.1 F (36.7 C) (08/31 1930) Temp Source: Oral (08/31 1930) BP: 127/64 (08/31 2000) Pulse Rate: 73 (08/31 2000) Intake/Output from previous day: 08/30 0701 - 08/31 0700 In: 3823.1 [I.V.:3553; IV Piggyback:270.1] Out: 1925 [Urine:1925] Intake/Output from this shift: No intake/output data recorded.  Labs: Recent Labs    02/10/18 0737  02/11/18 0808 02/11/18 1424 02/11/18 2020  WBC 12.0*  --  9.2  --   --   HGB 17.3  --  13.7  --   --   HCT 51.6  --  38.8*  --   --   PLT 271  --  224  --   --   CREATININE 1.54*   < > 0.82 0.93 0.87  MG  --    < > 2.5* 2.4 2.3  PHOS  --    < > 1.9* 2.7 1.8*  ALBUMIN 4.5  --   --   --   --   PROT 8.6*  --   --   --   --   AST 16  --   --   --   --   ALT 19  --   --   --   --   ALKPHOS 112  --   --   --   --   BILITOT 2.2*  --   --   --   --    < > = values in this interval not displayed.   Estimated Creatinine Clearance: 120.3 mL/min (by C-G formula based on SCr of 0.87 mg/dL).   Microbiology: Recent Results (from the past 720 hour(s))  MRSA PCR Screening     Status: None   Collection Time: 02/10/18  7:59 PM  Result Value Ref Range Status   MRSA by PCR NEGATIVE NEGATIVE Final    Comment:        The GeneXpert MRSA Assay (FDA approved for NASAL specimens only), is one component of a comprehensive MRSA colonization surveillance program. It is not intended to diagnose MRSA infection nor to guide or monitor treatment for MRSA infections. Performed at [facility], [address] [place], [place] [address]     Medical History: Past Medical History:  Diagnosis Date  . Coronary artery disease    . Diabetes mellitus without complication (HCC)   . Hypertension   . S/P CABG x 1     Medications:  Infusions:  . sodium chloride 100 mL/hr at 02/11/18 1900  . potassium PHOSPHATE IVPB (in mmol)    .  sodium bicarbonate (isotonic) infusion in sterile water 50 mL/hr at 02/11/18 2102    Assessment: 61 yom cc CP and hyperglycemia. Was on insulin infusion for DKA which has now been switched to Lantus. Pharmacy consulted to help manage electrolyte replacement.  Goal of Therapy:  K 3.5 to 5 Ca 8.9 to 10.3 Mg 1.7 to 2.4 Phos 2.5 to 4.6  Plan:  Sodium phosphate infusion this morning started at 06:50. K was 3.4 but no supplement ordered, Mg 2.5, Ca 8.3. Repeat labs ordered for this afternoon. Will follow up repeat labs before ordering any more supplement.  8/31 @ 20:30 :  K = 3.4 ,  Phos = 1.8 Will order Potassium  phosphate 30 mmol IV X 1 and recheck electrolytes on 9/1 @ 0200.   Kaelani Kendrick D, Pharm.D Clinical Pharmacist 02/11/2018,9:04 PM

## 2018-02-11 NOTE — Progress Notes (Signed)
PT Cancellation Note  Patient Details Name: Jeremy [name] MRN: [id number] DOB: Jun 11, 1956   Cancelled Treatment:    Reason Eval/Treat Not Completed: Patient declined, no reason specified.  PT consult received.  Chart reviewed.  Discussed pt with MD Kalisetti who cleared PT to work with pt.  Upon PT entering room, pt watching TV.  Therapist educated pt on purpose of PT consult and pt then stated he was comfortable in bed and wanted to watch TV.  Encouraged pt to participate in therapy but pt stated "love you but" and pointed his finger to the door.  Therapist clarified with pt that he was refusing PT d/t wanting to watch TV and pt reported that was correct.  Nurse notified.  Will re-attempt PT evaluation at a later date/time as able.  Hendricks LimesEmily Neithan Day, PT 02/11/18, 3:44 PM [phone]

## 2018-02-11 NOTE — Progress Notes (Signed)
   Name: Jeremy [name] MRN: [id number] DOB: 07-25-1955     CONSULTATION DATE: 02/10/2018 Subjective & Objectives: AG closed, remains on Bicarb. Gtt, and off Insulin gtt.  PAST MEDICAL HISTORY :   has a past medical history of Coronary artery disease, Diabetes mellitus without complication (HCC), Hypertension, and S/P CABG x 1.  has a past surgical history that includes Ankle arthroscopy w/ open repair (Right) and Coronary artery bypass graft. Prior to Admission medications   Not on File   Allergies  Allergen Reactions  . Amoxicillin Swelling    FAMILY HISTORY:  family history includes Heart attack in his father. SOCIAL HISTORY:  reports that he has been smoking cigarettes. He has been smoking about 0.50 packs per day. He has quit using smokeless tobacco. He reports that he drinks about 1.0 standard drinks of alcohol per week. He reports that he has current or past drug history. Drug: Marijuana.  REVIEW OF SYSTEMS:   Unable to obtain due to critical illness   VITAL SIGNS: Temp:  [98.1 F (36.7 C)-98.4 F (36.9 C)] 98.1 F (36.7 C) (08/31 0700) Pulse Rate:  [67-95] 73 (08/31 1300) Resp:  [12-26] 18 (08/31 1300) BP: (102-170)/(50-108) 144/72 (08/31 1300) SpO2:  [94 %-100 %] 98 % (08/31 1300) FiO2 (%):  [28 %] 28 % (08/30 1727)  Physical Examination:  Awake and oriented.  Nonfocal neuro exam Tolerating nasal cannula, talking in full sentences, no distress, bilateral equal air entry and no adventitious sounds S1 & S2 are audible with no murmur Benign abdominal exam with normal peristalsis. No leg edema   ASSESSMENT / PLAN:   Anion gap metabolic acidosis with DKA. Off Insulin gtt -Optimize bicarb solution, glycemic control, monitor anion gap and renal panel.  Altered mental status with toxic metabolic encephalopathy (improved).  Nonfocal neurological exam -Monitor neuro status  New onset diabetes mellitus -Symptom control and monitor hemoglobin  A1c  Uncontrolled hypertension -Optimize antihypertensives and monitor hemodynamics  Pseudo-hyponatremia with hyperglycemia -Glycemic control and monitor serum electrolytes  AKI with intravascular volume depletion (improved) -Optimize hydration, avoid nephrotoxins, monitor renal panel and urine output  Diabetic gastropathy -Reglan  Atelectasis and pneumonia.  Left lower airspace disease -Empiric doxycycline.  Monitor CXR + CBC + FiO2.  CAD status post CABG.  Inferior ischemia on EKG when compared to a study done on December 2018, no chest pain, normal initial troponin -Aspirin and follow with echocardiogram  Full code  Supportive care Plan of care was discussed with the patient and the wife at the bedside  Critical care time 45 minutes

## 2018-02-12 ENCOUNTER — Inpatient Hospital Stay: Payer: Medicaid Other

## 2018-02-12 LAB — GLUCOSE, CAPILLARY
GLUCOSE-CAPILLARY: 235 mg/dL — AB (ref 70–99)
GLUCOSE-CAPILLARY: 270 mg/dL — AB (ref 70–99)
Glucose-Capillary: 174 mg/dL — ABNORMAL HIGH (ref 70–99)
Glucose-Capillary: 190 mg/dL — ABNORMAL HIGH (ref 70–99)
Glucose-Capillary: 213 mg/dL — ABNORMAL HIGH (ref 70–99)
Glucose-Capillary: 248 mg/dL — ABNORMAL HIGH (ref 70–99)
Glucose-Capillary: 276 mg/dL — ABNORMAL HIGH (ref 70–99)

## 2018-02-12 LAB — MAGNESIUM
MAGNESIUM: 2.3 mg/dL (ref 1.7–2.4)
Magnesium: 2.3 mg/dL (ref 1.7–2.4)
Magnesium: 2.4 mg/dL (ref 1.7–2.4)

## 2018-02-12 LAB — BASIC METABOLIC PANEL
ANION GAP: 9 (ref 5–15)
ANION GAP: 9 (ref 5–15)
Anion gap: 12 (ref 5–15)
BUN: 9 mg/dL (ref 6–20)
BUN: 10 mg/dL (ref 6–20)
BUN: 9 mg/dL (ref 6–20)
CALCIUM: 8.3 mg/dL — AB (ref 8.9–10.3)
CHLORIDE: 100 mmol/L (ref 98–111)
CO2: 22 mmol/L (ref 22–32)
CO2: 25 mmol/L (ref 22–32)
CO2: 26 mmol/L (ref 22–32)
CREATININE: 0.78 mg/dL (ref 0.61–1.24)
Calcium: 8 mg/dL — ABNORMAL LOW (ref 8.9–10.3)
Calcium: 8.4 mg/dL — ABNORMAL LOW (ref 8.9–10.3)
Chloride: 100 mmol/L (ref 98–111)
Chloride: 99 mmol/L (ref 98–111)
Creatinine, Ser: 0.79 mg/dL (ref 0.61–1.24)
Creatinine, Ser: 0.86 mg/dL (ref 0.61–1.24)
GFR calc Af Amer: >60 mL/min (ref >60)
GFR calc Af Amer: >60 mL/min (ref >60)
GFR calc Af Amer: >60 mL/min (ref >60)
GFR calc non Af Amer: >60 mL/min (ref >60)
GFR calc non Af Amer: >60 mL/min (ref >60)
GLUCOSE: 222 mg/dL — AB (ref 70–99)
GLUCOSE: 246 mg/dL — AB (ref 70–99)
Glucose, Bld: 219 mg/dL — ABNORMAL HIGH (ref 70–99)
POTASSIUM: 3.2 mmol/L — AB (ref 3.5–5.1)
Potassium: 3.1 mmol/L — ABNORMAL LOW (ref 3.5–5.1)
Potassium: 3.2 mmol/L — ABNORMAL LOW (ref 3.5–5.1)
Sodium: 133 mmol/L — ABNORMAL LOW (ref 135–145)
Sodium: 134 mmol/L — ABNORMAL LOW (ref 135–145)
Sodium: 135 mmol/L (ref 135–145)

## 2018-02-12 LAB — CBC
HEMATOCRIT: 35.9 % — AB (ref 40.0–52.0)
HEMOGLOBIN: 12.6 g/dL — AB (ref 13.0–18.0)
MCH: 31.4 pg (ref 26.0–34.0)
MCHC: 35.1 g/dL (ref 32.0–36.0)
MCV: 89.6 fL (ref 80.0–100.0)
Platelets: 196 10*3/uL (ref 150–440)
RBC: 4 MIL/uL — ABNORMAL LOW (ref 4.40–5.90)
RDW: 13.3 % (ref 11.5–14.5)
WBC: 6.1 10*3/uL (ref 3.8–10.6)

## 2018-02-12 LAB — PHOSPHORUS
Phosphorus: 2.1 mg/dL — ABNORMAL LOW (ref 2.5–4.6)
Phosphorus: 2.3 mg/dL — ABNORMAL LOW (ref 2.5–4.6)
Phosphorus: 2.4 mg/dL — ABNORMAL LOW (ref 2.5–4.6)

## 2018-02-12 LAB — HEPATIC FUNCTION PANEL
ALK PHOS: 68 U/L (ref 38–126)
ALT: 13 U/L (ref 0–44)
AST: 14 U/L — AB (ref 15–41)
Albumin: 3.2 g/dL — ABNORMAL LOW (ref 3.5–5.0)
Bilirubin, Direct: <0.1 mg/dL (ref 0.0–0.2)
TOTAL PROTEIN: 5.8 g/dL — AB (ref 6.5–8.1)
Total Bilirubin: 1.1 mg/dL (ref 0.3–1.2)

## 2018-02-12 LAB — LIPASE, BLOOD: Lipase: 59 U/L — ABNORMAL HIGH (ref 11–51)

## 2018-02-12 MED ORDER — METFORMIN HCL 500 MG PO TABS
500.0000 mg | ORAL_TABLET | Freq: Two times a day (BID) | ORAL | Status: DC
  Administered 2018-02-12 (×2): 500 mg via ORAL
  Filled 2018-02-12 (×3): qty 1

## 2018-02-12 MED ORDER — GLIMEPIRIDE 2 MG PO TABS
2.0000 mg | ORAL_TABLET | Freq: Every day | ORAL | Status: DC
  Administered 2018-02-12: 2 mg via ORAL
  Filled 2018-02-12: qty 1

## 2018-02-12 MED ORDER — POTASSIUM CHLORIDE CRYS ER 20 MEQ PO TBCR
40.0000 meq | EXTENDED_RELEASE_TABLET | Freq: Once | ORAL | Status: AC
  Administered 2018-02-12: 40 meq via ORAL
  Filled 2018-02-12: qty 2

## 2018-02-12 MED ORDER — LINAGLIPTIN 5 MG PO TABS
5.0000 mg | ORAL_TABLET | Freq: Every day | ORAL | Status: DC
  Administered 2018-02-12 – 2018-02-13 (×2): 5 mg via ORAL
  Filled 2018-02-12 (×3): qty 1

## 2018-02-12 MED ORDER — POTASSIUM CHLORIDE 20 MEQ PO PACK: 40.0000 meq | PACK | Freq: Once | ORAL | Status: DC

## 2018-02-12 MED ORDER — GLIMEPIRIDE 2 MG PO TABS
2.0000 mg | ORAL_TABLET | Freq: Every day | ORAL | Status: DC
  Administered 2018-02-13: 2 mg via ORAL
  Filled 2018-02-12: qty 1

## 2018-02-12 MED ORDER — POTASSIUM CHLORIDE 10 MEQ/100ML IV SOLN
10.0000 meq | INTRAVENOUS | Status: DC
  Filled 2018-02-12 (×4): qty 100

## 2018-02-12 MED ORDER — K PHOS MONO-SOD PHOS DI & MONO 155-852-130 MG PO TABS
500.0000 mg | ORAL_TABLET | ORAL | Status: AC
  Administered 2018-02-12 (×3): 500 mg via ORAL
  Filled 2018-02-12 (×4): qty 2

## 2018-02-12 MED ORDER — IBUPROFEN 400 MG PO TABS
400.0000 mg | ORAL_TABLET | Freq: Once | ORAL | Status: AC
  Administered 2018-02-12: 400 mg via ORAL
  Filled 2018-02-12: qty 1

## 2018-02-12 MED ORDER — INSULIN GLARGINE 100 UNIT/ML ~~LOC~~ SOLN
15.0000 [IU] | Freq: Every day | SUBCUTANEOUS | Status: DC
  Administered 2018-02-12: 15 [IU] via SUBCUTANEOUS
  Filled 2018-02-12: qty 0.15

## 2018-02-12 MED ORDER — METOCLOPRAMIDE HCL 5 MG/5ML PO SOLN
10.0000 mg | Freq: Two times a day (BID) | ORAL | Status: DC | PRN
  Filled 2018-02-12: qty 10

## 2018-02-12 MED ORDER — INSULIN ASPART 100 UNIT/ML ~~LOC~~ SOLN
0.0000 [IU] | Freq: Three times a day (TID) | SUBCUTANEOUS | Status: DC
  Administered 2018-02-12: 5 [IU] via SUBCUTANEOUS
  Administered 2018-02-12: 2 [IU] via SUBCUTANEOUS
  Filled 2018-02-12 (×4): qty 1

## 2018-02-12 MED ORDER — POTASSIUM CHLORIDE 20 MEQ PO PACK
40.0000 meq | PACK | Freq: Once | ORAL | Status: AC
  Administered 2018-02-12: 40 meq via ORAL
  Filled 2018-02-12: qty 2

## 2018-02-12 MED ORDER — INSULIN ASPART 100 UNIT/ML ~~LOC~~ SOLN: 0.0000 [IU] | Freq: Every day | SUBCUTANEOUS | Status: DC

## 2018-02-12 NOTE — Progress Notes (Signed)
Patient stating he will not take insulin at home. Dr. Nemiah Commander Hold Lantus and give sliding scale insulin. Will continue to assess.

## 2018-02-12 NOTE — Progress Notes (Addendum)
   Name: Jeremy [name] MRN: [id number] DOB: [birth year]     CONSULTATION DATE: 02/10/2018  Subjective & Objectives: C/o dyspepsia. No major issues last night  PAST MEDICAL HISTORY :   has a past medical history of Coronary artery disease, Diabetes mellitus without complication (HCC), Hypertension, and S/P CABG x 1.  has a past surgical history that includes Ankle arthroscopy w/ open repair (Right) and Coronary artery bypass graft. Prior to Admission medications   Not on File   Allergies  Allergen Reactions  . Amoxicillin Swelling    FAMILY HISTORY:  family history includes Heart attack in his father. SOCIAL HISTORY:  reports that he has been smoking cigarettes. He has been smoking about 0.50 packs per day. He has quit using smokeless tobacco. He reports that he drinks about 1.0 standard drinks of alcohol per week. He reports that he has current or past drug history. Drug: Marijuana.  REVIEW OF SYSTEMS:   Unable to obtain due to critical illness   VITAL SIGNS: Temp:  [98.1 F (36.7 C)-98.5 F (36.9 C)] 98.5 F (36.9 C) (09/01 0018) Pulse Rate:  [67-82] 73 (09/01 0600) Resp:  [12-35] 14 (09/01 0600) BP: (108-156)/(55-94) 139/94 (09/01 0243) SpO2:  [95 %-100 %] 96 % (09/01 0600)  Physical Examination: Awake and oriented. Nonfocal neuro exam Tolerating nasal cannula, talking in full sentences, no distress, bilateral equal air entry and no adventitious sounds S1 & S2 are audible with no murmur Benign abdominal exam with normal peristalsis. No leg edema   ASSESSMENT / PLAN:   Anion gap metabolic acidosis with DKA. Off Insulin gtt -D/C bicarb solution, glycemic control, monitor anion gap and renal panel.  Altered mental status with toxic metabolic encephalopathy (improved). Nonfocal neurological exam -Monitor neuro status  New onset diabetes mellitus -Symptom control and monitor hemoglobin A1c  Uncontrolled hypertension -Optimize antihypertensives and  monitor hemodynamics  Pseudo-hyponatremia with hyperglycemia -Glycemic control and monitor serum electrolytes  AKI with intravascular volume depletion (improved) -Optimize hydration, avoid nephrotoxins, monitor renal panel and urine output  Diabetic gastropathy and dyspepsia -Reglan -Abd u/s, LFT   Atelectasis and pneumonia. Left lower airspace disease -Empiric doxycycline + Incentive spirometer. Monitor CXR + CBC + FiO2.  Hypokalemia and hypophosphatemia -Replete and monitor electrolytes.  CAD status post CABG. Inferior ischemia on EKG when compared to a study done on December 2018, no chest pain, normal initial troponin -Aspirin and follow with echocardiogram  Anemia -Keep HB > 7 gm/dl.  Full code  Supportive care Plan of care was discussed with the patient and the wife at the bedside  Critical care time 35 minutes

## 2018-02-12 NOTE — Progress Notes (Addendum)
MEDICATION RELATED CONSULT NOTE - INITIAL   Pharmacy Consult for electrolyte management Indication: hypophosphatemia  LABS: Recent Labs    02/10/18 0737  02/11/18 0808  02/12/18 0002 02/12/18 0551 02/12/18 1030 02/12/18 1515  WBC 12.0*  --  9.2  --   --  6.1  --   --   HGB 17.3  --  13.7  --   --  12.6*  --   --   HCT 51.6  --  38.8*  --   --  35.9*  --   --   PLT 271  --  224  --   --  196  --   --   CREATININE 1.54*   < > 0.82   < > 0.86 0.78  --  0.79  MG  --    < > 2.5*   < > 2.3 2.3 2.4  --   PHOS  --    < > 1.9*   < > 2.1* 2.3* 2.4*  --   ALBUMIN 4.5  --   --   --   --   --  3.2*  --   PROT 8.6*  --   --   --   --   --  5.8*  --   AST 16  --   --   --   --   --  14*  --   ALT 19  --   --   --   --   --  13  --   ALKPHOS 112  --   --   --   --   --  68  --   BILITOT 2.2*  --   --   --   --   --  1.1  --   BILIDIR  --   --   --   --   --   --  <0.1  --   IBILI  --   --   --   --   --   --  NOT CALCULATED  --    < > = values in this interval not displayed.   Potassium (mmol/L)  Date Value  02/12/2018 3.2 (L)  10/03/2011 3.8   Magnesium (mg/dL)  Date Value  56/21/3086 2.4   Calcium (mg/dL)  Date Value  57/84/6962 8.4 (L)   Calcium, Total (mg/dL)  Date Value  95/28/4132 8.7   Albumin (g/dL)  Date Value  44/06/270 3.2 (L)  10/02/2011 3.6   Phosphorus (mg/dL)  Date Value  53/66/4403 2.4 (L)  ] Estimated Creatinine Clearance: 130.8 mL/min (by C-G formula based on SCr of 0.79 mg/dL).  Medications:  Infusions:  . sodium chloride Stopped (02/12/18 0439)  . potassium chloride      Assessment: 61 yom cc CP and hyperglycemia. Was on insulin infusion for DKA which has now been switched to Lantus. Pharmacy consulted to help manage electrolyte replacement.  Goal of Therapy:  K 3.5 to 5 Ca 8.9 to 10.3 Mg 1.7 to 2.4 Phos 2.5 to 4.6  Plan:  9/1 10:30 renal panel returned with phos still slightly low but has 2 more oral replacement doses ordered. Finish  those doses. Recheck phos and mg with AM labs. K for 6 hours after dose.   Pharmacy will continue to follow labs and replace as needed.   9/1:  K @ 15:00 = 3.2 Will order KCl 10 mEq IV X 4 and recheck electrolytes on 9/2 with AM labs.   Wadie Liew D,  PharmD Clinical Pharmacist 02/12/2018 4:13 PM

## 2018-02-12 NOTE — Evaluation (Signed)
Physical Therapy Evaluation Patient Details Name: Martial Statt MRN: [id number] DOB: 05/29/1956 Today's Date: 02/12/2018   History of Present Illness  Pt is a 62 y.o. male presenting to hospital 02/10/18 with chest pain and hyperglycemia (400's).  Pt admitted with acute DKA, new diagnosis uncontrolled DM type 2, AKI d/t dehydration, acute pseudohyponatremia/hypochloremia, and lethargy.  PMH includes CAD, htn, CABG, ankle arthroscopy with open repair.  Clinical Impression  Prior to hospital admission, pt was independent.  Pt lives with his wife in a 1 story home.  Pt initially declining PT with multiple excuses but then suddenly agreeable to session.  Currently pt is modified independent with bed mobility, independent with transfers, and initially CGA progressing to SBA ambulating around ICU unit no AD (did one lap around and then pt requesting to perform 2nd lap around ICU).  Mild SOB noted by end of ambulation; no loss of balance noted with functional mobility.  HR and O2 sats WFL during session.  Overall demonstrating generalized weakness but anticipate will improve with continued mobility.  Pt would benefit from skilled PT to address noted impairments and functional limitations during hospital stay (see below for any additional details).  Upon hospital discharge, no further PT needs anticipated.    Follow Up Recommendations No PT follow up    Equipment Recommendations  None recommended by PT    Recommendations for Other Services       Precautions / Restrictions Precautions Precautions: Fall Restrictions Weight Bearing Restrictions: No      Mobility  Bed Mobility Overal bed mobility: Modified Independent             General bed mobility comments: Supine to/from sit with HOB elevated without any noted difficulties.  Transfers Overall transfer level: Independent Equipment used: None             General transfer comment: steady safe  transfers  Ambulation/Gait Ambulation/Gait assistance: Min Emergency planning/management officer (Feet): 440 Feet Assistive device: None Gait Pattern/deviations: Step-through pattern   Gait velocity interpretation: <1.31 ft/sec, indicative of household ambulator General Gait Details: decreased stance time R LE (pt reports from old ankle injury), increased B lateral sway, no loss of balance noted  Stairs            Wheelchair Mobility    Modified Rankin (Stroke Patients Only)       Balance Overall balance assessment: Needs assistance Sitting-balance support: No upper extremity supported;Feet supported Sitting balance-Leahy Scale: Normal Sitting balance - Comments: steady sitting reaching outside BOS   Standing balance support: No upper extremity supported;During functional activity Standing balance-Leahy Scale: Good Standing balance comment: steady standing reaching outside BOS; increased B lateral sway with ambulation but no loss of balance                             Pertinent Vitals/Pain Pain Assessment: No/denies pain  Vitals (HR and O2 on room air) stable and WFL throughout treatment session.    Home Living Family/patient expects to be discharged to:: Private residence Living Arrangements: Spouse/significant other Available Help at Discharge: Family Type of Home: House Home Access: Stairs to enter Entrance Stairs-Rails: None Entrance Stairs-Number of Steps: 1 plus 1 Home Layout: One level Home Equipment: None      Prior Function Level of Independence: Independent               Hand Dominance        Extremity/Trunk Assessment   Upper Extremity Assessment  Upper Extremity Assessment: Generalized weakness    Lower Extremity Assessment Lower Extremity Assessment: Generalized weakness    Cervical / Trunk Assessment Cervical / Trunk Assessment: Normal  Communication   Communication: No difficulties  Cognition Arousal/Alertness:  Awake/alert Behavior During Therapy: Impulsive Overall Cognitive Status: Within Functional Limits for tasks assessed                                 General Comments: Verbose      General Comments General comments (skin integrity, edema, etc.): Pt resting in bed upon PT arrival; pt's wife present beginning of session but left during session.  Nursing cleared pt for participation in physical therapy.    Exercises  Ambulation   Assessment/Plan    PT Assessment Patient needs continued PT services  PT Problem List Decreased strength;Decreased balance;Decreased mobility       PT Treatment Interventions Gait training;DME instruction;Stair training;Functional mobility training;Therapeutic activities;Therapeutic exercise;Balance training;Patient/family education    PT Goals (Current goals can be found in the Care Plan section)  Acute Rehab PT Goals Patient Stated Goal: to go home PT Goal Formulation: With patient Time For Goal Achievement: 02/26/18 Potential to Achieve Goals: Good    Frequency Min 2X/week   Barriers to discharge        Co-evaluation               AM-PAC PT "6 Clicks" Daily Activity  Outcome Measure Difficulty turning over in bed (including adjusting bedclothes, sheets and blankets)?: None Difficulty moving from lying on back to sitting on the side of the bed? : None Difficulty sitting down on and standing up from a chair with arms (e.g., wheelchair, bedside commode, etc,.)?: None Help needed moving to and from a bed to chair (including a wheelchair)?: None Help needed walking in hospital room?: None Help needed climbing 3-5 steps with a railing? : A Little 6 Click Score: 23    End of Session Equipment Utilized During Treatment: Gait belt Activity Tolerance: Patient tolerated treatment well Patient left: in bed;with call bell/phone within reach;with bed alarm set Nurse Communication: Mobility status;Precautions PT Visit Diagnosis: Other  abnormalities of gait and mobility (R26.89);Muscle weakness (generalized) (M62.81)    Time: 4098-1191 PT Time Calculation (min) (ACUTE ONLY): 23 min   Charges:   PT Evaluation $PT Eval Low Complexity: 1 Low PT Treatments $Therapeutic Exercise: 8-22 mins       Hendricks Limes, PT 02/12/18, 5:17 PM [phone]

## 2018-02-12 NOTE — Progress Notes (Signed)
MEDICATION RELATED CONSULT NOTE - INITIAL   Pharmacy Consult for electrolyte management Indication: hypophosphatemia  LABS: Recent Labs    02/10/18 0737  02/11/18 0808  02/11/18 2020 02/12/18 0002 02/12/18 0551 02/12/18 1030  WBC 12.0*  --  9.2  --   --   --  6.1  --   HGB 17.3  --  13.7  --   --   --  12.6*  --   HCT 51.6  --  38.8*  --   --   --  35.9*  --   PLT 271  --  224  --   --   --  196  --   CREATININE 1.54*   < > 0.82   < > 0.87 0.86 0.78  --   MG  --    < > 2.5*   < > 2.3 2.3 2.3 2.4  PHOS  --    < > 1.9*   < > 1.8* 2.1* 2.3* 2.4*  ALBUMIN 4.5  --   --   --   --   --   --  3.2*  PROT 8.6*  --   --   --   --   --   --  5.8*  AST 16  --   --   --   --   --   --  14*  ALT 19  --   --   --   --   --   --  13  ALKPHOS 112  --   --   --   --   --   --  68  BILITOT 2.2*  --   --   --   --   --   --  1.1  BILIDIR  --   --   --   --   --   --   --  <0.1  IBILI  --   --   --   --   --   --   --  NOT CALCULATED   < > = values in this interval not displayed.   Potassium (mmol/L)  Date Value  02/12/2018 3.1 (L)  10/03/2011 3.8   Magnesium (mg/dL)  Date Value  87/86/7672 2.4   Calcium (mg/dL)  Date Value  09/47/0962 8.0 (L)   Calcium, Total (mg/dL)  Date Value  83/66/2947 8.7   Albumin (g/dL)  Date Value  65/46/5035 3.2 (L)  10/02/2011 3.6   Phosphorus (mg/dL)  Date Value  46/56/8127 2.4 (L)  ] Estimated Creatinine Clearance: 130.8 mL/min (by C-G formula based on SCr of 0.78 mg/dL).  Medications:  Infusions:  . sodium chloride Stopped (02/12/18 0439)    Assessment: 61 yom cc CP and hyperglycemia. Was on insulin infusion for DKA which has now been switched to Lantus. Pharmacy consulted to help manage electrolyte replacement.  Goal of Therapy:  K 3.5 to 5 Ca 8.9 to 10.3 Mg 1.7 to 2.4 Phos 2.5 to 4.6  Plan:  9/1 10:30 renal panel returned with phos still slightly low but has 2 more oral replacement doses ordered. Finish those doses. Recheck phos  and mg with AM labs. K for 6 hours after dose.   Pharmacy will continue to follow labs and replace as needed.   Carola Frost, PharmD, BCPS Clinical Pharmacist 02/12/2018 11:10 AM

## 2018-02-12 NOTE — Progress Notes (Addendum)
[facility] [place] [facility]   PATIENT NAME: Jeremy [name]    MR#:  [id number]  DATE OF BIRTH:  04/05/1956  SUBJECTIVE:  CHIEF COMPLAINT:   Chief Complaint  Patient presents with  . Chest Pain  . Hyperglycemia   -Complains of a headache this morning.  Sugars are improving.  Anion gap closed. - wife is at bedside  REVIEW OF SYSTEMS:  Review of Systems  Constitutional: Negative for chills, fever and malaise/fatigue.  HENT: Negative for congestion, ear discharge, hearing loss and nosebleeds.   Eyes: Negative for blurred vision and double vision.  Respiratory: Negative for cough, shortness of breath and wheezing.   Cardiovascular: Negative for chest pain and palpitations.  Gastrointestinal: Positive for abdominal pain and heartburn. Negative for constipation, diarrhea, nausea and vomiting.  Genitourinary: Negative for dysuria.  Musculoskeletal: Negative for myalgias.  Neurological: Positive for tremors and headaches. Negative for dizziness, focal weakness, seizures and weakness.  Psychiatric/Behavioral: Negative for depression.    DRUG ALLERGIES:   Allergies  Allergen Reactions  . Amoxicillin Swelling    VITALS:  Blood pressure (!) 139/94, pulse 73, temperature 98.5 F (36.9 C), temperature source Oral, resp. rate 14, height 5\' 11"  (1.803 m), weight 122.5 kg, SpO2 96 %.  PHYSICAL EXAMINATION:  Physical Exam  GENERAL:  62 y.o.-year-old obese patient lying in the bed with no acute distress.  EYES: Pupils equal, round, reactive to light and accommodation. No scleral icterus. Extraocular muscles intact.  HEENT: Head atraumatic, normocephalic. Oropharynx and nasopharynx clear.  NECK:  Supple, no jugular venous distention. No thyroid enlargement, no tenderness.  LUNGS: Normal breath sounds bilaterally, no wheezing, rales,rhonchi or crepitation. No use of accessory muscles of respiration.  Decreased bibasilar breath sounds CARDIOVASCULAR: S1, S2  normal. No murmurs, rubs, or gallops.  ABDOMEN: Soft, nontender, nondistended. Bowel sounds present. No organomegaly or mass.  EXTREMITIES: No pedal edema, cyanosis, or clubbing.  Involuntary movements of both hands noted NEUROLOGIC: Cranial nerves II through XII are intact. Muscle strength 5/5 in all extremities. Sensation intact. Gait not checked.  PSYCHIATRIC: The patient is alert and oriented x 3.  Flat affect  sKIN: No obvious rash, lesion, or ulcer.    LABORATORY PANEL:   CBC Recent Labs  Lab 02/12/18 0551  WBC 6.1  HGB 12.6*  HCT 35.9*  PLT 196   ------------------------------------------------------------------------------------------------------------------  Chemistries  Recent Labs  Lab 02/10/18 0737  02/12/18 0551  NA 128*   < > 134*  K 4.6   < > 3.1*  CL 87*   < > 100  CO2 9*   < > 22  GLUCOSE 567*   < > 246*  BUN 23*   < > 9  CREATININE 1.54*   < > 0.78  CALCIUM 9.7   < > 8.0*  MG  --    < > 2.3  AST 16  --   --   ALT 19  --   --   ALKPHOS 112  --   --   BILITOT 2.2*  --   --    < > = values in this interval not displayed.   ------------------------------------------------------------------------------------------------------------------  Cardiac Enzymes Recent Labs  Lab 02/10/18 2213  TROPONINI <0.03   ------------------------------------------------------------------------------------------------------------------  RADIOLOGY:  Ct Abdomen Pelvis W Contrast  Result Date: 02/10/2018 CLINICAL DATA:  Generalized abdominal and pelvic pain. Hyperglycemia. EXAM: CT ABDOMEN AND PELVIS WITH CONTRAST TECHNIQUE: Multidetector CT imaging of the abdomen and pelvis was performed using the standard protocol following  bolus administration of intravenous contrast. CONTRAST:  ISOVUE-300 IOPAMIDOL (ISOVUE-300) INJECTION 61% COMPARISON:  None. FINDINGS: Lower chest: Lungs are clear. No pleural or pericardial fluid. Extensive coronary artery calcification noted.  Hepatobiliary: Normal Pancreas: Normal Spleen: Normal Adrenals/Urinary Tract: Adrenal glands are normal. Kidneys are normal. Bladder is normal. Stomach/Bowel: Distended stomach suggesting gastroparesis. No obstructing lesion identified. Intestine otherwise appears normal. Sigmoid diverticulosis without evidence of diverticulitis. Vascular/Lymphatic: Aortic atherosclerosis. Maximal diameter just proximal to the bifurcation is 2.7 cm. IVC is normal. No retroperitoneal adenopathy. Reproductive: Normal Other: No free fluid or air. Musculoskeletal: Ordinary lumbar degenerative changes. IMPRESSION: Distension of the stomach, most consistent with diabetic gastroparesis. Aortic atherosclerosis.  Coronary artery calcification. Electronically Signed   By: Paulina Fusi M.D.   On: 02/10/2018 11:08    EKG:   Orders placed or performed during the hospital encounter of 02/10/18  . EKG 12-Lead  . EKG 12-Lead    ASSESSMENT AND PLAN:   62 year old male with past medical history significant for CAD status post CABG, hypertension not in any medications at home brought in secondary to fatigue and noted to have DKA  1.  Diabetic ketoacidosis-on any medications at home. - new diagnosis -was on insulin drip.  Anion gap closed- off bicarb drip as well -Sugars are in 200 range - patient refuses to take insulin at home- wife confirms the same - DC lantus, start amaryl, tradjenta and metformin today and monitor - A1c is pending -  Diabetes coordinator consult  2.  GERD symptoms- on Protonix and added Maalox.    3.  Hypertension-currently on metoprolol  4.  Acute kidney injury-resolved with IV fluids  5.  Left lower lobe pneumonia- on doxycycline for now.  Continue to monitor  6.  CAD-status post CABG no active symptoms at this time.   - on asa  7.  Hypokalemia-being replaced  8.  DVT prophylaxis-Lovenox   Physical therapy consulted Likely home tomorrow Wife updated at bedside   All the records are  reviewed and case discussed with Care Management/Social Workerr. Management plans discussed with the patient, family and they are in agreement.  CODE STATUS: Full code  TOTAL TIME TAKING CARE OF THIS PATIENT: 39 minutes.   POSSIBLE D/C IN 1-2 DAYS, DEPENDING ON CLINICAL CONDITION.   Enid Baas M.D on 02/12/2018 at 9:45 AM  Between 7am to 6pm - Pager - [phone]  After 6pm go to [email or web address] - Social research officer, government  [facility]  Office  [phone]  CC: Primary care physician; Carlean Jews, NP

## 2018-02-12 NOTE — Progress Notes (Signed)
Dr.Samaan called at patient request to notify that patient has told this nurse " I wasn't the test cancelled. Just cancel it, I can't go that long without a cheese burger." Dr.Samaan acknowledged patient refusal, Ultrasound notified. Will continue to assess.

## 2018-02-12 NOTE — Progress Notes (Signed)
MEDICATION RELATED CONSULT NOTE - INITIAL   Pharmacy Consult for electrolyte management Indication: hypophosphatemia  LABS: Recent Labs    02/10/18 0737  02/11/18 0808 02/11/18 1424 02/11/18 2020 02/12/18 0002 02/12/18 0551  WBC 12.0*  --  9.2  --   --   --  6.1  HGB 17.3  --  13.7  --   --   --  12.6*  HCT 51.6  --  38.8*  --   --   --  35.9*  PLT 271  --  224  --   --   --  196  CREATININE 1.54*   < > 0.82 0.93 0.87 0.86 0.78  MG  --    < > 2.5* 2.4 2.3 2.3  --   PHOS  --    < > 1.9* 2.7 1.8* 2.1*  --   ALBUMIN 4.5  --   --   --   --   --   --   PROT 8.6*  --   --   --   --   --   --   AST 16  --   --   --   --   --   --   ALT 19  --   --   --   --   --   --   ALKPHOS 112  --   --   --   --   --   --   BILITOT 2.2*  --   --   --   --   --   --    < > = values in this interval not displayed.   Potassium (mmol/L)  Date Value  02/12/2018 3.1 (L)  10/03/2011 3.8   Magnesium (mg/dL)  Date Value  45/36/4680 2.3   Calcium (mg/dL)  Date Value  32/05/2481 8.0 (L)   Calcium, Total (mg/dL)  Date Value  50/08/7046 8.7   Albumin (g/dL)  Date Value  88/91/6945 4.5  10/02/2011 3.6   Phosphorus (mg/dL)  Date Value  03/88/8280 2.1 (L)  ] Estimated Creatinine Clearance: 130.8 mL/min (by C-G formula based on SCr of 0.78 mg/dL).  Medications:  Infusions:  . sodium chloride 100 mL/hr at 02/12/18 0600  . sodium chloride Stopped (02/12/18 0439)  .  sodium bicarbonate (isotonic) infusion in sterile water 50 mL/hr at 02/12/18 0000    Assessment: 61 yom cc CP and hyperglycemia. Was on insulin infusion for DKA which has now been switched to Lantus. Pharmacy consulted to help manage electrolyte replacement.  Goal of Therapy:  K 3.5 to 5 Ca 8.9 to 10.3 Mg 1.7 to 2.4 Phos 2.5 to 4.6  Plan:  0901 @ 0625 :  K = 3.1 ,  Phos = 2.3  Will order KCL x 1 dose and Phos Neutral Tabs 500mg  every 4 hours x 3 doses.   Pharmacy will continue to follow labs and replace as  needed.   Gardner Candle, PharmD, BCPS Clinical Pharmacist 02/12/2018 6:27 AM

## 2018-02-12 NOTE — Progress Notes (Signed)
MEDICATION RELATED CONSULT NOTE - INITIAL   Pharmacy Consult for electrolyte management Indication: hypophosphatemia  Allergies  Allergen Reactions  . Amoxicillin Swelling    Patient Measurements: Height: 5\' 11"  (180.3 cm) Weight: 270 lb (122.5 kg) IBW/kg (Calculated) : 75.3 Adjusted Body Weight:   Vital Signs: Temp: 98.5 F (36.9 C) (09/01 0018) Temp Source: Oral (09/01 0018) BP: 132/61 (09/01 0018) Pulse Rate: 71 (09/01 0018) Intake/Output from previous day: 08/31 0701 - 09/01 0700 In: 1295.2 [I.V.:1295.2] Out: 2200 [Urine:2200] Intake/Output from this shift: Total I/O In: 287.7 [I.V.:287.7] Out: 400 [Urine:400]  Labs: Recent Labs    02/10/18 0737  02/11/18 0808 02/11/18 1424 02/11/18 2020 02/12/18 0002  WBC 12.0*  --  9.2  --   --   --   HGB 17.3  --  13.7  --   --   --   HCT 51.6  --  38.8*  --   --   --   PLT 271  --  224  --   --   --   CREATININE 1.54*   < > 0.82 0.93 0.87 0.86  MG  --    < > 2.5* 2.4 2.3 2.3  PHOS  --    < > 1.9* 2.7 1.8* 2.1*  ALBUMIN 4.5  --   --   --   --   --   PROT 8.6*  --   --   --   --   --   AST 16  --   --   --   --   --   ALT 19  --   --   --   --   --   ALKPHOS 112  --   --   --   --   --   BILITOT 2.2*  --   --   --   --   --    < > = values in this interval not displayed.   Estimated Creatinine Clearance: 121.7 mL/min (by C-G formula based on SCr of 0.86 mg/dL).   Microbiology: Recent Results (from the past 720 hour(s))  MRSA PCR Screening     Status: None   Collection Time: 02/10/18  7:59 PM  Result Value Ref Range Status   MRSA by PCR NEGATIVE NEGATIVE Final    Comment:        The GeneXpert MRSA Assay (FDA approved for NASAL specimens only), is one component of a comprehensive MRSA colonization surveillance program. It is not intended to diagnose MRSA infection nor to guide or monitor treatment for MRSA infections. Performed at [facility], [address] [place], [place] [address]      Medical History: Past Medical History:  Diagnosis Date  . Coronary artery disease   . Diabetes mellitus without complication (HCC)   . Hypertension   . S/P CABG x 1     Medications:  Infusions:  . sodium chloride 100 mL/hr at 02/11/18 2100  . sodium chloride 250 mL (02/11/18 2208)  . potassium PHOSPHATE IVPB (in mmol) 30 mmol (02/11/18 2209)  .  sodium bicarbonate (isotonic) infusion in sterile water 50 mL/hr at 02/12/18 0000    Assessment: 61 yom cc CP and hyperglycemia. Was on insulin infusion for DKA which has now been switched to Lantus. Pharmacy consulted to help manage electrolyte replacement.  Goal of Therapy:  K 3.5 to 5 Ca 8.9 to 10.3 Mg 1.7 to 2.4 Phos 2.5 to 4.6  Plan:  0901 @ 0002 :  K = 3.2 ,  Phos = 2.1 Potassium phosphate 30 mmol IV still infusing. Will order KCL x 1 dose.   Pharmacy will continue to follow labs and replace as needed.   Gardner Candle, PharmD, BCPS Clinical Pharmacist 02/12/2018 1:39 AM

## 2018-02-13 LAB — GLUCOSE, CAPILLARY
GLUCOSE-CAPILLARY: 199 mg/dL — AB (ref 70–99)
Glucose-Capillary: 222 mg/dL — ABNORMAL HIGH (ref 70–99)

## 2018-02-13 LAB — BASIC METABOLIC PANEL
Anion gap: 7 (ref 5–15)
BUN: 8 mg/dL (ref 6–20)
CHLORIDE: 104 mmol/L (ref 98–111)
CO2: 26 mmol/L (ref 22–32)
Calcium: 8.3 mg/dL — ABNORMAL LOW (ref 8.9–10.3)
Creatinine, Ser: 0.68 mg/dL (ref 0.61–1.24)
GFR calc Af Amer: >60 mL/min (ref >60)
GFR calc non Af Amer: >60 mL/min (ref >60)
GLUCOSE: 203 mg/dL — AB (ref 70–99)
POTASSIUM: 3.4 mmol/L — AB (ref 3.5–5.1)
Sodium: 137 mmol/L (ref 135–145)

## 2018-02-13 LAB — MAGNESIUM: Magnesium: 2.3 mg/dL (ref 1.7–2.4)

## 2018-02-13 LAB — PHOSPHORUS: PHOSPHORUS: 2.8 mg/dL (ref 2.5–4.6)

## 2018-02-13 MED ORDER — GLIMEPIRIDE 2 MG PO TABS: 2.0000 mg | ORAL_TABLET | Freq: Every day | ORAL | 2 refills | Status: DC

## 2018-02-13 MED ORDER — METFORMIN HCL 1000 MG PO TABS: 1000.0000 mg | ORAL_TABLET | Freq: Two times a day (BID) | ORAL | 2 refills | Status: DC

## 2018-02-13 MED ORDER — DOXYCYCLINE HYCLATE 100 MG PO TABS: 100.0000 mg | ORAL_TABLET | Freq: Two times a day (BID) | ORAL | 0 refills | Status: AC

## 2018-02-13 MED ORDER — POTASSIUM CHLORIDE CRYS ER 20 MEQ PO TBCR
40.0000 meq | EXTENDED_RELEASE_TABLET | Freq: Once | ORAL | Status: AC
  Administered 2018-02-13: 40 meq via ORAL
  Filled 2018-02-13: qty 2

## 2018-02-13 MED ORDER — SITAGLIPTIN PHOSPHATE 100 MG PO TABS: 100.0000 mg | ORAL_TABLET | Freq: Every day | ORAL | 2 refills | Status: DC

## 2018-02-13 MED ORDER — LOSARTAN POTASSIUM 50 MG PO TABS: 50.0000 mg | ORAL_TABLET | Freq: Every day | ORAL | 2 refills | Status: DC

## 2018-02-13 MED ORDER — METFORMIN HCL 500 MG PO TABS
1000.0000 mg | ORAL_TABLET | Freq: Two times a day (BID) | ORAL | Status: DC
  Administered 2018-02-13: 1000 mg via ORAL
  Filled 2018-02-13 (×2): qty 2

## 2018-02-13 MED ORDER — BLOOD GLUCOSE MONITOR KIT: PACK | 2 refills | Status: DC

## 2018-02-13 NOTE — Discharge Summary (Signed)
[facility] NAME: Jeremy [name]    MR#:  [id number]  DATE OF BIRTH:  08/25/1955  DATE OF ADMISSION:  02/10/2018   ADMITTING PHYSICIAN: Gorden Harms, MD  DATE OF DISCHARGE: 02/13/2018 12:45 PM  PRIMARY CARE PHYSICIAN: Ronnell Freshwater, NP   ADMISSION DIAGNOSIS:   Diabetic ketoacidosis without coma associated with diabetes mellitus due to underlying condition ([place]) [E08.10] DKA (diabetic ketoacidoses) ([place]) [E13.10]  DISCHARGE DIAGNOSIS:   Active Problems:   DKA (diabetic ketoacidoses) ([place])   Pressure injury of skin   SECONDARY DIAGNOSIS:   Past Medical History:  Diagnosis Date  . Coronary artery disease   . Diabetes mellitus without complication ([place])   . Hypertension   . S/P CABG x 1     HOSPITAL COURSE:   62 year old male with past medical history significant for CAD status post CABG, hypertension not in any medications at home brought in secondary to fatigue and noted to have DKA  1.  Diabetic ketoacidosis- not on any medications at home. - new diagnosis, received insulin drip in ICU.  Currently off and then was started on subcutaneous insulin.  However patient and his wife refused that he would take subcu insulin at home.  They wanted to start medications. -Patient started on oral glimepiride, metformin and Januvia at discharge. Sugars have been reasonably controlled while in oral medications in the hospital. -Appreciate  Diabetes coordinator consult -Low-carb diet education given.  Advised to use glucometer to check his fingersticks.   2.  Hypertension-currently on losartan to help with proteinuria  3.  Acute kidney injury-resolved with IV fluids  4.  Left lower lobe pneumonia- on doxycycline for now.  Continue to monitor  5.  CAD-status post CABG no active symptoms at this time.   - on asa  Ambulated with physical therapy.  No needs at discharge.  Wife updated at bedside.    DISCHARGE  CONDITIONS:   Guarded  CONSULTS OBTAINED:   Treatment Team:  [facility], [facility], MD  DRUG ALLERGIES:   Allergies  Allergen Reactions  . Amoxicillin Swelling   DISCHARGE MEDICATIONS:   Allergies as of 02/13/2018      Reactions   Amoxicillin Swelling      Medication List    TAKE these medications   blood glucose meter kit and supplies Kit Dispense based on patient and insurance preference. Use up to four times daily as directed. (FOR ICD-9 250.00, 250.01).   doxycycline 100 MG tablet Commonly known as:  VIBRA-TABS Take 1 tablet (100 mg total) by mouth 2 (two) times daily with a meal for 5 days.   glimepiride 2 MG tablet Commonly known as:  AMARYL Take 1 tablet (2 mg total) by mouth daily with breakfast.   losartan 50 MG tablet Commonly known as:  COZAAR Take 1 tablet (50 mg total) by mouth daily.   metFORMIN 1000 MG tablet Commonly known as:  GLUCOPHAGE Take 1 tablet (1,000 mg total) by mouth 2 (two) times daily with a meal.   sitaGLIPtin 100 MG tablet Commonly known as:  JANUVIA Take 1 tablet (100 mg total) by mouth daily.        DISCHARGE INSTRUCTIONS:   1.  PCP follow-up in 1 to 2 weeks  DIET:   Cardiac diet and Diabetic diet  ACTIVITY:   Activity as tolerated  OXYGEN:   Home Oxygen: No.  Oxygen Delivery: room air  DISCHARGE LOCATION:   home   If you experience  worsening of your admission symptoms, develop shortness of breath, life threatening emergency, suicidal or homicidal thoughts you must seek medical attention immediately by calling 911 or calling your MD immediately  if symptoms less severe.  You Must read complete instructions/literature along with all the possible adverse reactions/side effects for all the Medicines you take and that have been prescribed to you. Take any new Medicines after you have completely understood and accpet all the possible adverse reactions/side effects.   Please note  You were cared for by a hospitalist  during your hospital stay. If you have any questions about your discharge medications or the care you received while you were in the hospital after you are discharged, you can call the unit and asked to speak with the hospitalist on call if the hospitalist that took care of you is not available. Once you are discharged, your primary care physician will handle any further medical issues. Please note that NO REFILLS for any discharge medications will be authorized once you are discharged, as it is imperative that you return to your primary care physician (or establish a relationship with a primary care physician if you do not have one) for your aftercare needs so that they can reassess your need for medications and monitor your lab values.    On the day of Discharge:  VITAL SIGNS:   Blood pressure 134/68, pulse 62, temperature 99.2 F (37.3 C), temperature source Oral, resp. rate 19, height _0  (1.803 m), weight 122.5 kg, SpO2 96 %.  PHYSICAL EXAMINATION:   GENERAL:  62 y.o.-year-old obese patient lying in the bed with no acute distress.  EYES: Pupils equal, round, reactive to light and accommodation. No scleral icterus. Extraocular muscles intact.  HEENT: Head atraumatic, normocephalic. Oropharynx and nasopharynx clear.  NECK:  Supple, no jugular venous distention. No thyroid enlargement, no tenderness.  LUNGS: Normal breath sounds bilaterally, no wheezing, rales,rhonchi or crepitation. No use of accessory muscles of respiration.  Decreased bibasilar breath sounds CARDIOVASCULAR: S1, S2 normal. No murmurs, rubs, or gallops.  ABDOMEN: Soft, nontender, nondistended. Bowel sounds present. No organomegaly or mass.  EXTREMITIES: No pedal edema, cyanosis, or clubbing.  Involuntary movements of both hands noted NEUROLOGIC: Cranial nerves II through XII are intact. Muscle strength 5/5 in all extremities. Sensation intact. Gait not checked.  PSYCHIATRIC: The patient is alert and oriented x 3.  Flat  affect  sKIN: No obvious rash, lesion, or ulcer.    DATA REVIEW:   CBC Recent Labs  Lab 02/12/18 0551  WBC 6.1  HGB 12.6*  HCT 35.9*  PLT 196    Chemistries  Recent Labs  Lab 02/12/18 1030  02/13/18 0313  NA  --    < > 137  K  --    < > 3.4*  CL  --    < > 104  CO2  --    < > 26  GLUCOSE  --    < > 203*  BUN  --    < > 8  CREATININE  --    < > 0.68  CALCIUM  --    < > 8.3*  MG 2.4  --  2.3  AST 14*  --   --   ALT 13  --   --   ALKPHOS 68  --   --   BILITOT 1.1  --   --    < > = values in this interval not displayed.     Microbiology Results  Results for  orders placed or performed during the hospital encounter of 02/10/18  MRSA PCR Screening     Status: None   Collection Time: 02/10/18  7:59 PM  Result Value Ref Range Status   MRSA by PCR NEGATIVE NEGATIVE Final    Comment:        The GeneXpert MRSA Assay (FDA approved for NASAL specimens only), is one component of a comprehensive MRSA colonization surveillance program. It is not intended to diagnose MRSA infection nor to guide or monitor treatment for MRSA infections. Performed at [facility], [address] [place], [place] [address]     RADIOLOGY:  No results found.   Management plans discussed with the patient, family and they are in agreement.  CODE STATUS:     Code Status Orders  (From admission, onward)         Start     Ordered   02/10/18 1349  Full code  Continuous     02/10/18 1348        Code Status History    Date Active Date Inactive Code Status Order ID Comments User Context   02/10/2018 1348 02/10/2018 1348 Full Code [id number]  Salary, Avel Peace, MD Inpatient      TOTAL TIME TAKING CARE OF THIS PATIENT: 38 minutes.    Gladstone Lighter M.D on 02/13/2018 at 1:19 PM  Between 7am to 6pm - Pager - [phone]  After 6pm go to [email or web address] - Proofreader  [facility]  Office  [phone]  CC: Primary care physician;  Ronnell Freshwater, NP   Note: This dictation was prepared with Dragon dictation along with smaller phrase technology. Any transcriptional errors that result from this process are unintentional.

## 2018-02-13 NOTE — Plan of Care (Signed)
  RD consulted for nutrition education regarding diabetes.   Lab Results  Component Value Date   HGBA1C 6.2 10/03/2011   62 year old male with PMHx of CAD s/p CABGx1, HTN admitted with DKA, new diagnosis DM, AKI.  Met with patient and wife at bedside. Patient easily distracted and hard to redirect. Unsure how appropriate he is for education, so majority of education completed with wife. Patient reports he has a good appetite and intake now that is at baseline. He was having a decreased appetite PTA due to nausea. He reports he used to eat 4-5 meals per day but has now been eating 2 meals per day plus a snack. He typically has a meat, salad, vegetables, and brown rice at meals. For breakfast this morning he had oatmeal and coffee with Splenda.  RD provided "Carbohydrate Counting for People with Diabetes" handout from the Academy of Nutrition and Dietetics. Discussed different food groups and their effects on blood sugar, emphasizing carbohydrate-containing foods. Provided list of carbohydrates and recommended serving sizes of common foods.  Discussed importance of controlled and consistent carbohydrate intake throughout the day. Provided examples of ways to balance meals/snacks and encouraged intake of high-fiber, whole grain complex carbohydrates. Teach back method used.  Expect fair compliance. Patient would benefit from outpatient DM counseling by RD and this order has already been placed.  Body mass index is 37.66 kg/m. Pt meets criteria for Obesity Class II based on current BMI.  Current diet order is Heart Healthy/Carbohydrate Modified, patient is consuming approximately 100% of meals at this time. Labs and medications reviewed. No further nutrition interventions warranted at this time. RD contact information provided. If additional nutrition issues arise, please re-consult RD.  Willey Blade, MS, [place], LDN Office: [phone] Pager: [phone] After Hours/Weekend Pager:  [phone]

## 2018-02-13 NOTE — Progress Notes (Signed)
MEDICATION RELATED CONSULT NOTE   Pharmacy Consult for electrolyte management Indication: hypophosphatemia  LABS: Recent Labs    02/11/18 0808  02/12/18 0551 02/12/18 1030 02/12/18 1515 02/13/18 0313  WBC 9.2  --  6.1  --   --   --   HGB 13.7  --  12.6*  --   --   --   HCT 38.8*  --  35.9*  --   --   --   PLT 224  --  196  --   --   --   CREATININE 0.82   < > 0.78  --  0.79 0.68  MG 2.5*   < > 2.3 2.4  --  2.3  PHOS 1.9*   < > 2.3* 2.4*  --  2.8  ALBUMIN  --   --   --  3.2*  --   --   PROT  --   --   --  5.8*  --   --   AST  --   --   --  14*  --   --   ALT  --   --   --  13  --   --   ALKPHOS  --   --   --  68  --   --   BILITOT  --   --   --  1.1  --   --   BILIDIR  --   --   --  <0.1  --   --   IBILI  --   --   --  NOT CALCULATED  --   --    < > = values in this interval not displayed.   Potassium (mmol/L)  Date Value  02/13/2018 3.4 (L)  10/03/2011 3.8   Magnesium (mg/dL)  Date Value  98/92/1194 2.3   Calcium (mg/dL)  Date Value  17/40/8144 8.3 (L)   Calcium, Total (mg/dL)  Date Value  81/85/6314 8.7   Albumin (g/dL)  Date Value  97/07/6376 3.2 (L)  10/02/2011 3.6   Phosphorus (mg/dL)  Date Value  58/85/0277 2.8  ] Estimated Creatinine Clearance: 130.8 mL/min (by C-G formula based on SCr of 0.68 mg/dL).  Medications:  Infusions:  . sodium chloride Stopped (02/12/18 0439)    Assessment: 61 yom cc CP and hyperglycemia. Was on insulin infusion for DKA which has now been switched to Lantus. Pharmacy consulted to help manage electrolyte replacement.  Goal of Therapy:  K 3.5 to 5 Ca 8.9 to 10.3 Mg 1.7 to 2.4 Phos 2.5 to 4.6  Plan:  9/1 10:30 renal panel returned with phos still slightly low but has 2 more oral replacement doses ordered. Finish those doses. Recheck phos and mg with AM labs. K for 6 hours after dose.  Pharmacy will continue to follow labs and replace as needed.   9/1:  K @ 15:00 = 3.2 Will order KCl 10 mEq IV X 4 and recheck  electrolytes on 9/2 with AM labs.   9/2  K 3.4 Mag 2.3  Phos 2.8 Scr 0.68 MD has ordered KCL PO 40 meq x 1. Will f/u with am labs  Angelique Blonder, PharmD Clinical Pharmacist 02/13/2018 8:14 AM

## 2018-02-13 NOTE — Progress Notes (Signed)
Met with pt and his significant other at bedside today.  Pt very restless and very talkative.  It was very difficult to have a meaningful conversation with pt.  He kept interrupting me and he kept fixating on the fact that he did not receive his medications this AM.  RN told me they had a Code in the ICU and that she was delayed giving the pt his meds.  Although I tried to redirect pt several times back to the conversation about his diabetes, pt continued to interrupt me and tell me that I was nice bu t that he needed his meds before he goes home.  Most of the conversation I had was directed with the SO, however, she mostly wanted to talk about her DM care with me.    Explained basic DM pathophysiology and the importance of good CBG control at home.  SO knows how to check CBGs and offered to help pt check his CBGs.  Encouraged pt to check his CBGs at least 1-2 times daily (before ar 2 hours after meals).  Encouraged pt to seek follow up care with PCP.  Discussed d/c meds of Metformin, Januvia, and Amaryl.  Explained how they work, when to take, side effects, etc.  Unsure how much information pt will retain from our conversation.  Requested that RN please give pt his oral DM meds prior to d/c.     --Will follow patient during hospitalization--  Wyn Quaker RN, MSN, CDE Diabetes Coordinator Inpatient Glycemic Control Team Team Pager: [phone] (8a-5p)

## 2018-02-13 NOTE — Progress Notes (Signed)
Inpatient Diabetes Program Recommendations  AACE/ADA: New Consensus Statement on Inpatient Glycemic Control (2015)  Target Ranges:  Prepandial:   less than 140 mg/dL      Peak postprandial:   less than 180 mg/dL (1-2 hours)      Critically ill patients:  140 - 180 mg/dL   Results for Jeremy [name], Jeremy [name] (MRN [id number]) as of 02/13/2018 07:37  Ref. Range 02/12/2018 07:30 02/12/2018 12:45 02/12/2018 13:48 02/12/2018 17:38 02/12/2018 21:46  Glucose-Capillary Latest Ref Range: 70 - 99 mg/dL 794 (H)    15 units LANTUS 276 (H)  5 units NOVOLOG  270 (H) 174 (H)  2 units NOVOLOG  190 (H)   Results for Jeremy [name], Jeremy [name] (MRN [id number]) as of 02/13/2018 07:37  Ref. Range 02/13/2018 03:13  Glucose Latest Ref Range: 70 - 99 mg/dL 827 (H)     Admit with:DKA/ New Diagnosis of DM2  History:CABG  Current Orders:Novolog Sensitive Correction Scale/ SSI (0-9 units) TID AC + HS      Amaryl 2 mg daily      Metformin 1000 mg BID      Tradjenta 5 mg daily      Note patient admitted with DKA and New Diagnosis of DM type 2.  Patient complained ofincreased urinary frequency, frequent urination at night, increased thirst.  PCP listed as Deanna Artis sure when last seen by PCP?  Current Hemoglobin A1c level pending.      MD-  Note patient and his spouse adamantly refuse to start insulin for patient at home.  Three oral medications have been started instead: Metformin, Tradjenta, Amaryl.  With these meds and nutritional changes and exercise, patient may be able to control CBGs at home.  Will attempt to speak with patient and his wife today about his new diagnosis.  Will also re-order RD consult for nutritional education.     --Will follow patient during hospitalization--  Ambrose Finland RN, MSN, CDE Diabetes Coordinator Inpatient Glycemic Control Team Team Pager: [phone] (8a-5p)

## 2018-02-13 NOTE — Discharge Instructions (Signed)
1. Check blood sugars atleast twice a day- the morning fasting sugars should be ideally around 90-110 2. Evening blood sugars should be between 140-180 3. Please call MD if sugars running less than 70 (hold your diabetic pills then) and also if sugars >300 4. Low carb diet

## 2018-02-14 LAB — GLUCOSE, CAPILLARY: Glucose-Capillary: 366 mg/dL — ABNORMAL HIGH (ref 70–99)

## 2018-02-14 LAB — HEMOGLOBIN A1C
Hgb A1c MFr Bld: >15.5 % — ABNORMAL HIGH (ref 4.8–5.6)
Hgb A1c MFr Bld: 15 % — ABNORMAL HIGH (ref 4.8–5.6)
Mean Plasma Glucose: 384 mg/dL

## 2018-02-14 LAB — CALCIUM, IONIZED
CALCIUM, IONIZED, SERUM: 4.8 mg/dL (ref 4.5–5.6)
CALCIUM, IONIZED, SERUM: 4.3 mg/dL — AB (ref 4.5–5.6)
Calcium, Ionized, Serum: 3.5 mg/dL — ABNORMAL LOW (ref 4.5–5.6)
Calcium, Ionized, Serum: 4.3 mg/dL — ABNORMAL LOW (ref 4.5–5.6)
Calcium, Ionized, Serum: 4.4 mg/dL — ABNORMAL LOW (ref 4.5–5.6)

## 2018-03-03 LAB — BLOOD GAS, VENOUS
Acid-base deficit: 5.4 mmol/L — ABNORMAL HIGH (ref 0.0–2.0)
Bicarbonate: 21.2 mmol/L (ref 20.0–28.0)
PATIENT TEMPERATURE: 37
pCO2, Ven: 44 mmHg (ref 44.0–60.0)
pH, Ven: 7.29 (ref 7.250–7.430)

## 2018-03-20 ENCOUNTER — Ambulatory Visit: Payer: Self-pay | Admitting: Adult Health

## 2018-10-08 ENCOUNTER — Emergency Department
Admission: EM | Admit: 2018-10-08 | Discharge: 2018-10-08 | Disposition: A | Discharge status: Home / Self Care | Payer: Medicaid Other | Attending: Emergency Medicine | Admitting: Emergency Medicine

## 2018-10-08 ENCOUNTER — Other Ambulatory Visit: Payer: Self-pay

## 2018-10-08 ENCOUNTER — Encounter: Payer: Self-pay | Admitting: Emergency Medicine

## 2018-10-08 DIAGNOSIS — I10 Essential (primary) hypertension: Secondary | ICD-10-CM | POA: Insufficient documentation

## 2018-10-08 DIAGNOSIS — E119 Type 2 diabetes mellitus without complications: Secondary | ICD-10-CM | POA: Diagnosis not present

## 2018-10-08 DIAGNOSIS — L03115 Cellulitis of right lower limb: Secondary | ICD-10-CM | POA: Insufficient documentation

## 2018-10-08 DIAGNOSIS — Z951 Presence of aortocoronary bypass graft: Secondary | ICD-10-CM | POA: Diagnosis not present

## 2018-10-08 DIAGNOSIS — M79671 Pain in right foot: Secondary | ICD-10-CM | POA: Diagnosis present

## 2018-10-08 DIAGNOSIS — F17210 Nicotine dependence, cigarettes, uncomplicated: Secondary | ICD-10-CM | POA: Diagnosis not present

## 2018-10-08 DIAGNOSIS — I2510 Atherosclerotic heart disease of native coronary artery without angina pectoris: Secondary | ICD-10-CM | POA: Insufficient documentation

## 2018-10-08 LAB — GLUCOSE, CAPILLARY: Glucose-Capillary: 179 mg/dL — ABNORMAL HIGH (ref 70–99)

## 2018-10-08 MED ORDER — METFORMIN HCL 1000 MG PO TABS: 1000.0000 mg | ORAL_TABLET | Freq: Two times a day (BID) | ORAL | 2 refills | Status: DC

## 2018-10-08 MED ORDER — CEPHALEXIN 500 MG PO CAPS
500.0000 mg | ORAL_CAPSULE | Freq: Once | ORAL | Status: AC
  Administered 2018-10-08: 500 mg via ORAL
  Filled 2018-10-08: qty 1

## 2018-10-08 MED ORDER — SITAGLIPTIN PHOSPHATE 100 MG PO TABS: 100.0000 mg | ORAL_TABLET | Freq: Every day | ORAL | 2 refills | Status: DC

## 2018-10-08 MED ORDER — GLIMEPIRIDE 2 MG PO TABS: 2.0000 mg | ORAL_TABLET | Freq: Every day | ORAL | 2 refills | Status: DC

## 2018-10-08 MED ORDER — LOSARTAN POTASSIUM 50 MG PO TABS: 50.0000 mg | ORAL_TABLET | Freq: Every day | ORAL | 2 refills | Status: DC

## 2018-10-08 MED ORDER — CEPHALEXIN 500 MG PO CAPS: 500.0000 mg | ORAL_CAPSULE | Freq: Three times a day (TID) | ORAL | 0 refills | Status: DC

## 2018-10-08 NOTE — ED Notes (Signed)
AAOx3.  Skin warm and dry.  NAD 

## 2018-10-08 NOTE — ED Triage Notes (Signed)
Pt presents to ED with c/o R foot wound. Pt states hx of screw coming out of his foot after surgery. Pt states has been prescribed keflex in the past to treat the wound with success. Pt states here today to get a refill on Keflex.

## 2018-10-08 NOTE — ED Provider Notes (Signed)
[facility] Emergency Department Provider Note  ____________________________________________   First MD Initiated Contact with Patient 10/08/18 1131     (approximate)  I have reviewed the triage vital signs and the nursing notes.   HISTORY  Chief Complaint Foot Pain    HPI Jeremy [name] is a 63 y.o. male presents emergency department complaining of a wound on the right ankle.  He states he had a screw that had come out of the hardware in his ankle and then just came out through the skin.  He states he occasionally gets drainage from this area.  He usually takes Keflex which will resolve the problem.  He is also out of his diabetic medications.  He denies any fever, chills, chest pain or shortness of breath.    Past Medical History:  Diagnosis Date  . Coronary artery disease   . Diabetes mellitus without complication ([place])   . Hypertension   . S/P CABG x 1     Patient Active Problem List   Diagnosis Date Noted  . Pressure injury of skin 02/11/2018  . DKA (diabetic ketoacidoses) ([place]) 02/10/2018    Past Surgical History:  Procedure Laterality Date  . ANKLE ARTHROSCOPY W/ OPEN REPAIR Right   . CORONARY ARTERY BYPASS GRAFT      Prior to Admission medications   Medication Sig Start Date End Date Taking? Authorizing Provider  blood glucose meter kit and supplies KIT Dispense based on patient and insurance preference. Use up to four times daily as directed. (FOR ICD-9 250.00, 250.01). 02/13/18   Gladstone Lighter, MD  cephALEXin (KEFLEX) 500 MG capsule Take 1 capsule (500 mg total) by mouth 3 (three) times daily. 10/08/18   Caryn Section Linden Dolin, PA-C  glimepiride (AMARYL) 2 MG tablet Take 1 tablet (2 mg total) by mouth daily with breakfast. 10/08/18   Caryn Section Linden Dolin, PA-C  losartan (COZAAR) 50 MG tablet Take 1 tablet (50 mg total) by mouth daily. 10/08/18 01/06/19  Versie Starks, PA-C  metFORMIN (GLUCOPHAGE) 1000 MG tablet Take 1 tablet (1,000 mg total) by  mouth 2 (two) times daily with a meal. 10/08/18   , Linden Dolin, PA-C  sitaGLIPtin (JANUVIA) 100 MG tablet Take 1 tablet (100 mg total) by mouth daily. 10/08/18   Versie Starks, PA-C    Allergies Amoxicillin  Family History  Problem Relation Age of Onset  . Heart attack Father     Social History Social History   Tobacco Use  . Smoking status: Current Some Day Smoker    Packs/day: 0.50    Types: Cigarettes  . Smokeless tobacco: Former Network engineer Use Topics  . Alcohol use: Yes    Alcohol/week: 1.0 standard drinks    Types: 1 Cans of beer per week  . Drug use: Yes    Types: Marijuana    Review of Systems  Constitutional: No fever/chills Eyes: No visual changes. ENT: No sore throat. Respiratory: Denies cough Genitourinary: Negative for dysuria. Musculoskeletal: Negative for back pain.  Positive for drainage from the right ankle Skin: Negative for rash.    ____________________________________________   PHYSICAL EXAM:  VITAL SIGNS: ED Triage Vitals  Enc Vitals Group     BP 10/08/18 1123 (!) 170/97     Pulse Rate 10/08/18 1123 89     Resp 10/08/18 1123 18     Temp 10/08/18 1123 (!) 97.5 F (36.4 C)     Temp Source 10/08/18 1123 Oral     SpO2 10/08/18 1123 96 %  Weight 10/08/18 1124 260 lb (117.9 kg)     Height 10/08/18 1124 '5\' 11"'  (1.803 m)     Head Circumference --      Peak Flow --      Pain Score 10/08/18 1124 0     Pain Loc --      Pain Edu? --      Excl. in [place]? --     Constitutional: Alert and oriented. Well appearing and in no acute distress. Eyes: Conjunctivae are normal.  Head: Atraumatic. Nose: No congestion/rhinnorhea. Mouth/Throat: Mucous membranes are moist.   Neck:  supple no lymphadenopathy noted Cardiovascular: Normal rate, regular rhythm. Heart sounds are normal Respiratory: Normal respiratory effort.  No retractions, lungs c t a  Abd: soft nontender bs normal all 4 quad GU: deferred Musculoskeletal: FROM all extremities,  warm and well perfused, right ankle has an old wound with some cellulitis noted clear drainage is noted.  No odor to the drainage Neurologic:  Normal speech and language.  Skin:  Skin is warm, dry . Psychiatric: Mood and affect are normal. Speech and behavior are normal.  ____________________________________________   LABS (all labs ordered are listed, but only abnormal results are displayed)  Labs Reviewed  GLUCOSE, CAPILLARY - Abnormal; Notable for the following components:      Result Value   Glucose-Capillary 179 (*)    All other components within normal limits  CBG MONITORING, ED   ____________________________________________   ____________________________________________  RADIOLOGY    ____________________________________________   PROCEDURES  Procedure(s) performed: No  Procedures    ____________________________________________   INITIAL IMPRESSION / ASSESSMENT AND PLAN / ED COURSE  Pertinent labs & imaging results that were available during my care of the patient were reviewed by me and considered in my medical decision making (see chart for details).   Patient is 63 year old male presents emergency department requesting Keflex for a chronic wound on the right ankle.  Physical exam shows the wound to have a clear drainage with no odor.  Some cellulitis is noted near the wound.  Discussed findings with patient.  He was given a prescription for Keflex.  He was also given refills on his diabetic medications as he states he is out of all of them.  He is to follow-up with his regular doctor for continued refills of his medication.  He is to return to the emergency department if the redness and drainage is worsening.  He states he understands will comply.  Is discharged stable condition.     As part of my medical decision making, I reviewed the following data within the [facility] notes reviewed and incorporated, Labs reviewed fingerstick  glucose shows 179, Old chart reviewed, Notes from prior ED visits and [place] Controlled Substance Database  ____________________________________________   FINAL CLINICAL IMPRESSION(S) / ED DIAGNOSES  Final diagnoses:  Cellulitis of right lower extremity      NEW MEDICATIONS STARTED DURING THIS VISIT:  New Prescriptions   CEPHALEXIN (KEFLEX) 500 MG CAPSULE    Take 1 capsule (500 mg total) by mouth 3 (three) times daily.     Note:  This document was prepared using Dragon voice recognition software and may include unintentional dictation errors.    Versie Starks, PA-C 10/08/18 [place], [place], MD 10/08/18 [phone]

## 2018-10-08 NOTE — Discharge Instructions (Addendum)
Follow-up with your regular doctor if not better in 3 days.  Return emergency department worsening.  Take your medications as prescribed.  Follow-up with your primary care doctor for additional refills of your diabetes medicine.

## 2018-10-22 ENCOUNTER — Encounter: Payer: Self-pay | Admitting: Emergency Medicine

## 2018-10-22 ENCOUNTER — Inpatient Hospital Stay
Admission: EM | Admit: 2018-10-22 | Discharge: 2018-10-24 | DRG: 638 | Disposition: A | Discharge status: Home Health | Payer: Medicaid Other | Attending: Internal Medicine | Admitting: Internal Medicine

## 2018-10-22 ENCOUNTER — Emergency Department: Payer: Medicaid Other

## 2018-10-22 ENCOUNTER — Other Ambulatory Visit: Payer: Self-pay

## 2018-10-22 DIAGNOSIS — Z7984 Long term (current) use of oral hypoglycemic drugs: Secondary | ICD-10-CM | POA: Diagnosis not present

## 2018-10-22 DIAGNOSIS — S82891S Other fracture of right lower leg, sequela: Secondary | ICD-10-CM | POA: Diagnosis not present

## 2018-10-22 DIAGNOSIS — L089 Local infection of the skin and subcutaneous tissue, unspecified: Secondary | ICD-10-CM

## 2018-10-22 DIAGNOSIS — I10 Essential (primary) hypertension: Secondary | ICD-10-CM | POA: Diagnosis present

## 2018-10-22 DIAGNOSIS — E11622 Type 2 diabetes mellitus with other skin ulcer: Secondary | ICD-10-CM | POA: Diagnosis not present

## 2018-10-22 DIAGNOSIS — I878 Other specified disorders of veins: Secondary | ICD-10-CM | POA: Diagnosis present

## 2018-10-22 DIAGNOSIS — T361X5A Adverse effect of cephalosporins and other beta-lactam antibiotics, initial encounter: Secondary | ICD-10-CM | POA: Diagnosis present

## 2018-10-22 DIAGNOSIS — L500 Allergic urticaria: Secondary | ICD-10-CM | POA: Diagnosis present

## 2018-10-22 DIAGNOSIS — I2510 Atherosclerotic heart disease of native coronary artery without angina pectoris: Secondary | ICD-10-CM | POA: Diagnosis present

## 2018-10-22 DIAGNOSIS — Z8249 Family history of ischemic heart disease and other diseases of the circulatory system: Secondary | ICD-10-CM | POA: Diagnosis not present

## 2018-10-22 DIAGNOSIS — Z1159 Encounter for screening for other viral diseases: Secondary | ICD-10-CM

## 2018-10-22 DIAGNOSIS — F17210 Nicotine dependence, cigarettes, uncomplicated: Secondary | ICD-10-CM | POA: Diagnosis present

## 2018-10-22 DIAGNOSIS — E11628 Type 2 diabetes mellitus with other skin complications: Secondary | ICD-10-CM

## 2018-10-22 DIAGNOSIS — N289 Disorder of kidney and ureter, unspecified: Secondary | ICD-10-CM | POA: Diagnosis present

## 2018-10-22 DIAGNOSIS — L0390 Cellulitis, unspecified: Secondary | ICD-10-CM | POA: Diagnosis not present

## 2018-10-22 DIAGNOSIS — L97318 Non-pressure chronic ulcer of right ankle with other specified severity: Secondary | ICD-10-CM | POA: Diagnosis present

## 2018-10-22 DIAGNOSIS — Z951 Presence of aortocoronary bypass graft: Secondary | ICD-10-CM

## 2018-10-22 DIAGNOSIS — T3695XA Adverse effect of unspecified systemic antibiotic, initial encounter: Secondary | ICD-10-CM

## 2018-10-22 DIAGNOSIS — M12571 Traumatic arthropathy, right ankle and foot: Secondary | ICD-10-CM | POA: Diagnosis present

## 2018-10-22 LAB — CBC WITH DIFFERENTIAL/PLATELET
Abs Immature Granulocytes: 0.01 10*3/uL (ref 0.00–0.07)
Basophils Absolute: 0 10*3/uL (ref 0.0–0.1)
Basophils Relative: 1 %
Eosinophils Absolute: 0.7 10*3/uL — ABNORMAL HIGH (ref 0.0–0.5)
Eosinophils Relative: 12 %
HCT: 45.4 % (ref 39.0–52.0)
Hemoglobin: 15.2 g/dL (ref 13.0–17.0)
Immature Granulocytes: 0 %
Lymphocytes Relative: 34 %
Lymphs Abs: 2 10*3/uL (ref 0.7–4.0)
MCH: 31.6 pg (ref 26.0–34.0)
MCHC: 33.5 g/dL (ref 30.0–36.0)
MCV: 94.4 fL (ref 80.0–100.0)
Monocytes Absolute: 0.7 10*3/uL (ref 0.1–1.0)
Monocytes Relative: 12 %
Neutro Abs: 2.5 10*3/uL (ref 1.7–7.7)
Neutrophils Relative %: 41 %
Platelets: 272 10*3/uL (ref 150–400)
RBC: 4.81 MIL/uL (ref 4.22–5.81)
RDW: 13.8 % (ref 11.5–15.5)
WBC: 5.9 10*3/uL (ref 4.0–10.5)
nRBC: 0 % (ref 0.0–0.2)

## 2018-10-22 LAB — COMPREHENSIVE METABOLIC PANEL
ALT: 14 U/L (ref 0–44)
AST: 17 U/L (ref 15–41)
Albumin: 4.1 g/dL (ref 3.5–5.0)
Alkaline Phosphatase: 79 U/L (ref 38–126)
Anion gap: 8 (ref 5–15)
BUN: 14 mg/dL (ref 8–23)
CO2: 22 mmol/L (ref 22–32)
Calcium: 9.1 mg/dL (ref 8.9–10.3)
Chloride: 110 mmol/L (ref 98–111)
Creatinine, Ser: 1.26 mg/dL — ABNORMAL HIGH (ref 0.61–1.24)
GFR calc Af Amer: >60 mL/min (ref >60)
GFR calc non Af Amer: >60 mL/min (ref >60)
Glucose, Bld: 77 mg/dL (ref 70–99)
Potassium: 4.4 mmol/L (ref 3.5–5.1)
Sodium: 140 mmol/L (ref 135–145)
Total Bilirubin: 0.5 mg/dL (ref 0.3–1.2)
Total Protein: 7.4 g/dL (ref 6.5–8.1)

## 2018-10-22 LAB — GLUCOSE, CAPILLARY: Glucose-Capillary: 252 mg/dL — ABNORMAL HIGH (ref 70–99)

## 2018-10-22 LAB — HEMOGLOBIN A1C
Hgb A1c MFr Bld: 7.6 % — ABNORMAL HIGH (ref 4.8–5.6)
Mean Plasma Glucose: 171.42 mg/dL

## 2018-10-22 LAB — SARS CORONAVIRUS 2 BY RT PCR (HOSPITAL ORDER, PERFORMED IN ~~LOC~~ HOSPITAL LAB): SARS Coronavirus 2: NEGATIVE

## 2018-10-22 LAB — LACTIC ACID, PLASMA: Lactic Acid, Venous: 2.2 mmol/L (ref 0.5–1.9)

## 2018-10-22 MED ORDER — FAMOTIDINE IN NACL 20-0.9 MG/50ML-% IV SOLN
20.0000 mg | Freq: Once | INTRAVENOUS | Status: AC
  Administered 2018-10-22: 14:00:00 20 mg via INTRAVENOUS
  Filled 2018-10-22: qty 50

## 2018-10-22 MED ORDER — DIPHENHYDRAMINE HCL 50 MG/ML IJ SOLN
25.0000 mg | Freq: Once | INTRAMUSCULAR | Status: AC
  Administered 2018-10-22: 14:00:00 25 mg via INTRAVENOUS
  Filled 2018-10-22: qty 1

## 2018-10-22 MED ORDER — INSULIN ASPART 100 UNIT/ML ~~LOC~~ SOLN
0.0000 [IU] | Freq: Three times a day (TID) | SUBCUTANEOUS | Status: DC
  Administered 2018-10-23: 08:00:00 2 [IU] via SUBCUTANEOUS
  Administered 2018-10-23: 12:00:00 1 [IU] via SUBCUTANEOUS
  Filled 2018-10-22 (×2): qty 1

## 2018-10-22 MED ORDER — VANCOMYCIN HCL 10 G IV SOLR
2500.0000 mg | INTRAVENOUS | Status: DC
  Filled 2018-10-22 (×2): qty 2500

## 2018-10-22 MED ORDER — MORPHINE SULFATE (PF) 4 MG/ML IV SOLN
4.0000 mg | Freq: Once | INTRAVENOUS | Status: AC
  Administered 2018-10-22: 18:00:00 4 mg via INTRAVENOUS
  Filled 2018-10-22: qty 1

## 2018-10-22 MED ORDER — DOCUSATE SODIUM 100 MG PO CAPS: 100.0000 mg | ORAL_CAPSULE | Freq: Two times a day (BID) | ORAL | Status: DC | PRN

## 2018-10-22 MED ORDER — LEVOFLOXACIN IN D5W 750 MG/150ML IV SOLN
750.0000 mg | INTRAVENOUS | Status: DC
  Administered 2018-10-22 – 2018-10-23 (×2): 750 mg via INTRAVENOUS
  Filled 2018-10-22 (×3): qty 150

## 2018-10-22 MED ORDER — LOSARTAN POTASSIUM 50 MG PO TABS
50.0000 mg | ORAL_TABLET | Freq: Every day | ORAL | Status: DC
  Administered 2018-10-22 – 2018-10-24 (×3): 50 mg via ORAL
  Filled 2018-10-22 (×3): qty 1

## 2018-10-22 MED ORDER — HEPARIN SODIUM (PORCINE) 5000 UNIT/ML IJ SOLN
5000.0000 [IU] | Freq: Three times a day (TID) | INTRAMUSCULAR | Status: DC
  Administered 2018-10-22 – 2018-10-24 (×5): 5000 [IU] via SUBCUTANEOUS
  Filled 2018-10-22 (×5): qty 1

## 2018-10-22 MED ORDER — SODIUM CHLORIDE 0.9 % IV BOLUS
500.0000 mL | Freq: Once | INTRAVENOUS | Status: AC
  Administered 2018-10-22: 14:00:00 500 mL via INTRAVENOUS

## 2018-10-22 MED ORDER — VANCOMYCIN HCL 10 G IV SOLR
2500.0000 mg | Freq: Once | INTRAVENOUS | Status: AC
  Administered 2018-10-22: 18:00:00 2500 mg via INTRAVENOUS
  Filled 2018-10-22: qty 2500

## 2018-10-22 MED ORDER — METHYLPREDNISOLONE SODIUM SUCC 125 MG IJ SOLR
125.0000 mg | Freq: Once | INTRAMUSCULAR | Status: AC
  Administered 2018-10-22: 14:00:00 125 mg via INTRAVENOUS
  Filled 2018-10-22: qty 2

## 2018-10-22 MED ORDER — MORPHINE SULFATE (PF) 2 MG/ML IV SOLN: 2.0000 mg | INTRAVENOUS | Status: DC | PRN

## 2018-10-22 MED ORDER — INSULIN ASPART 100 UNIT/ML ~~LOC~~ SOLN
0.0000 [IU] | Freq: Every day | SUBCUTANEOUS | Status: DC
  Administered 2018-10-23: 3 [IU] via SUBCUTANEOUS
  Filled 2018-10-22: qty 1

## 2018-10-22 NOTE — ED Notes (Signed)
ED TO INPATIENT HANDOFF REPORT  ED Nurse Name and Sarita Bottomg rn   S Name/Age/Gender Jeremy [name] 63 y.o. male Room/Bed: ED41A/ED41A  Code Status   Code Status: Prior  Home/SNF/Other Home Patient oriented to: self, place, time and situation Is this baseline? Yes   Triage Complete: Triage complete  Chief Complaint Ankle Pain   Triage Note Pt to ED via POV pt c/o right ankle pain. Pt also states that he has a rash on his face and across his shoulders. Pt is in NAD.    Allergies Allergies  Allergen Reactions  . Amoxicillin Swelling  . Keflex [Cephalexin] Rash    Level of Care/Admitting Diagnosis ED Disposition    ED Disposition Condition Comment   Admit  Hospital Area: [facility] [[id number]]  Level of Care: Med-Surg [16]  Covid Evaluation: N/A  Diagnosis: Diabetic foot infection [facility]) [[id number]]  Admitting Physician: Altamese Dilling [phone]  Attending Physician: Altamese Dilling [phone]  Estimated length of stay: past midnight tomorrow  Certification:: I certify this patient will need inpatient services for at least 2 midnights  PT Class (Do Not Modify): Inpatient [101]  PT Acc Code (Do Not Modify): Private [1]       B Medical/Surgery History Past Medical History:  Diagnosis Date  . Coronary artery disease   . Diabetes mellitus without complication (HCC)   . Hypertension   . S/P CABG x 1    Past Surgical History:  Procedure Laterality Date  . ANKLE ARTHROSCOPY W/ OPEN REPAIR Right   . CORONARY ARTERY BYPASS GRAFT       A IV Location/Drains/Wounds Patient Lines/Drains/Airways Status   Active Line/Drains/Airways    Name:   Placement date:   Placement time:   Site:   Days:   Peripheral IV 10/22/18 Left Hand   10/22/18    1310    Hand   less than 1   Peripheral IV 10/22/18 Left;Upper Arm   10/22/18    1731    Arm   less than 1   Pressure Injury 02/10/18 Unstageable - Full thickness tissue loss in which the base of  the ulcer is covered by slough (yellow, tan, gray, green or brown) and/or eschar (tan, brown or black) in the wound bed.   02/10/18    1426     254          Intake/Output Last 24 hours  Intake/Output Summary (Last 24 hours) at 10/22/2018 2110 Last data filed at 10/22/2018 2041 Gross per 24 hour  Intake 850 ml  Output -  Net 850 ml    Labs/Imaging Results for orders placed or performed during the hospital encounter of 10/22/18 (from the past 48 hour(s))  Lactic acid, plasma     Status: Abnormal   Collection Time: 10/22/18  1:20 PM  Result Value Ref Range   Lactic Acid, Venous 2.2 (HH) 0.5 - 1.9 mmol/L    Comment: CRITICAL RESULT CALLED TO, READ BACK BY AND VERIFIED WITH JEANETTE PEREZ  10/22/18 AKT Performed at [facility], [address] [place], [place] [address]   CBC with Differential     Status: Abnormal   Collection Time: 10/22/18  1:22 PM  Result Value Ref Range   WBC 5.9 4.0 - 10.5 K/uL   RBC 4.81 4.22 - 5.81 MIL/uL   Hemoglobin 15.2 13.0 - 17.0 g/dL   HCT 69.6 29.5 - 28.4 %   MCV 94.4 80.0 - 100.0 fL   MCH 31.6 26.0 - 34.0  pg   MCHC 33.5 30.0 - 36.0 g/dL   RDW 16.1 09.6 - 04.5 %   Platelets 272 150 - 400 K/uL   nRBC 0.0 0.0 - 0.2 %   Neutrophils Relative % 41 %   Neutro Abs 2.5 1.7 - 7.7 K/uL   Lymphocytes Relative 34 %   Lymphs Abs 2.0 0.7 - 4.0 K/uL   Monocytes Relative 12 %   Monocytes Absolute 0.7 0.1 - 1.0 K/uL   Eosinophils Relative 12 %   Eosinophils Absolute 0.7 (H) 0.0 - 0.5 K/uL   Basophils Relative 1 %   Basophils Absolute 0.0 0.0 - 0.1 K/uL   Immature Granulocytes 0 %   Abs Immature Granulocytes 0.01 0.00 - 0.07 K/uL    Comment: Performed at [facility], [address] [place], [place] [address]  Comprehensive metabolic panel     Status: Abnormal   Collection Time: 10/22/18  1:22 PM  Result Value Ref Range   Sodium 140 135 - 145 mmol/L   Potassium 4.4 3.5 - 5.1 mmol/L   Chloride 110 98 - 111 mmol/L   CO2 22 22 - 32  mmol/L   Glucose, Bld 77 70 - 99 mg/dL   BUN 14 8 - 23 mg/dL   Creatinine, Ser 1.91 (H) 0.61 - 1.24 mg/dL   Calcium 9.1 8.9 - 47.8 mg/dL   Total Protein 7.4 6.5 - 8.1 g/dL   Albumin 4.1 3.5 - 5.0 g/dL   AST 17 15 - 41 U/L   ALT 14 0 - 44 U/L   Alkaline Phosphatase 79 38 - 126 U/L   Total Bilirubin 0.5 0.3 - 1.2 mg/dL   GFR calc non Af Amer >60 >60 mL/min   GFR calc Af Amer >60 >60 mL/min   Anion gap 8 5 - 15    Comment: Performed at [facility], [address] [place], [place] [address]  SARS Coronavirus 2 (CEPHEID - Performed in [facility] hospital lab), Hosp Order     Status: None   Collection Time: 10/22/18  3:56 PM  Result Value Ref Range   SARS Coronavirus 2 NEGATIVE NEGATIVE    Comment: (NOTE) If result is NEGATIVE SARS-CoV-2 target nucleic acids are NOT DETECTED. The SARS-CoV-2 RNA is generally detectable in upper and lower  respiratory specimens during the acute phase of infection. The lowest  concentration of SARS-CoV-2 viral copies this assay can detect is 250  copies / mL. A negative result does not preclude SARS-CoV-2 infection  and should not be used as the sole basis for treatment or other  patient management decisions.  A negative result may occur with  improper specimen collection / handling, submission of specimen other  than nasopharyngeal swab, presence of viral mutation(s) within the  areas targeted by this assay, and inadequate number of viral copies  (<250 copies / mL). A negative result must be combined with clinical  observations, patient history, and epidemiological information. If result is POSITIVE SARS-CoV-2 target nucleic acids are DETECTED. The SARS-CoV-2 RNA is generally detectable in upper and lower  respiratory specimens dur ing the acute phase of infection.  Positive  results are indicative of active infection with SARS-CoV-2.  Clinical  correlation with patient history and other diagnostic information is  necessary to determine  patient infection status.  Positive results do  not rule out bacterial infection or co-infection with other viruses. If result is PRESUMPTIVE POSTIVE SARS-CoV-2 nucleic acids MAY BE PRESENT.   A presumptive positive result was obtained  on the submitted specimen  and confirmed on repeat testing.  While 2019 novel coronavirus  (SARS-CoV-2) nucleic acids may be present in the submitted sample  additional confirmatory testing may be necessary for epidemiological  and / or clinical management purposes  to differentiate between  SARS-CoV-2 and other Sarbecovirus currently known to infect humans.  If clinically indicated additional testing with an alternate test  methodology [phone]([id number]) is advised. The SARS-CoV-2 RNA is generally  detectable in upper and lower respiratory sp ecimens during the acute  phase of infection. The expected result is Negative. Fact Sheet for Patients:  BoilerBrush.com.cyhttps://www.fda.gov/media/136312/download Fact Sheet for Healthcare Providers: https://pope.com/https://www.fda.gov/media/136313/download This test is not yet approved or cleared by the [place] FDA and has been authorized for detection and/or diagnosis of SARS-CoV-2 by FDA under an Emergency Use Authorization (EUA).  This EUA will remain in effect (meaning this test can be used) for the duration of the COVID-19 declaration under Section 564(b)(1) of the Act, 21 U.S.C. section 360bbb-3(b)(1), unless the authorization is terminated or revoked sooner. Performed at [facility], [address] [place], [place] [id number]    Dg Ankle Complete Right  Result Date: 10/22/2018 CLINICAL DATA:  Acute right ankle pain and swelling. EXAM: RIGHT ANKLE - COMPLETE 3+ VIEW COMPARISON:  Radiographs of November 20, 2017. FINDINGS: Status post surgical internal fixation of old right distal fibular fracture, with fusion of the distal right tibia fibular joint. 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 No new fracture or  dislocation is noted. At least 1 distal screw has broken off, with a portion sitting in the overlying soft tissues; this is unchanged compared to prior exam. IMPRESSION: Stable postsurgical, posttraumatic and degenerative changes are noted compared to prior exam. No definite acute abnormality is noted. Electronically Signed   By: Lupita RaiderJames  Green Jr M.D.   On: 10/22/2018 13:26    Pending Labs Unresulted Labs (From admission, onward)    Start     Ordered   10/23/18 0500  Culture, blood (single) w Reflex to ID Panel  Once,   STAT     10/22/18 1725   10/22/18 1725  Hemoglobin A1c  Add-on,   AD     10/22/18 1724   10/22/18 1437  Blood culture (routine x 2)  BLOOD CULTURE X 2,   STAT     10/22/18 1436   Signed and Held  Basic metabolic panel  Tomorrow morning,   R     Signed and Held   Signed and Held  CBC  Tomorrow morning,   R     Signed and Held   Signed and Held  CBC  (heparin)  Once,   R    Comments:  Baseline for heparin therapy IF NOT ALREADY DRAWN.  Notify MD if PLT < 100 K.    Signed and Held   Signed and Held  Creatinine, serum  (heparin)  Once,   R    Comments:  Baseline for heparin therapy IF NOT ALREADY DRAWN.    Signed and Held          Vitals/Pain Today's Vitals   10/22/18 1636 10/22/18 1836 10/22/18 2106 10/22/18 2107  BP: 129/76   122/74  Pulse: 88   92  Resp: 18   17  Temp:      TempSrc:      SpO2: 99%   98%  Weight:      Height:      PainSc: 0-No pain 0-No pain 0-No pain  0-No pain    Isolation Precautions No active isolations  Medications Medications  morphine 2 MG/ML injection 2 mg (has no administration in time range)  levofloxacin (LEVAQUIN) IVPB 750 mg (750 mg Intravenous New Bag/Given 10/22/18 2048)  methylPREDNISolone sodium succinate (SOLU-MEDROL) 125 mg/2 mL injection 125 mg (125 mg Intravenous Given 10/22/18 1342)  diphenhydrAMINE (BENADRYL) injection 25 mg (25 mg Intravenous Given 10/22/18 1342)  famotidine (PEPCID) IVPB 20 mg premix (0 mg  Intravenous Stopped 10/22/18 1413)  sodium chloride 0.9 % bolus 500 mL (0 mLs Intravenous Stopped 10/22/18 1836)  morphine 4 MG/ML injection 4 mg (4 mg Intravenous Given 10/22/18 1735)  vancomycin (VANCOCIN) 2,500 mg in sodium chloride 0.9 % 500 mL IVPB (0 mg Intravenous Stopped 10/22/18 2041)    Mobility walks Low fall risk   Focused Assessments      R Recommendations: See Admitting Provider Note  Report given to:   Additional Notes:

## 2018-10-22 NOTE — Progress Notes (Signed)
Family Meeting Note  Advance Directive:yes  Today a meeting took place with the Patient.  The following clinical team members were present during this meeting:MD  The following were discussed:Patient's diagnosis: Diabetic foot infection, Patient's progosis: Unable to determine and Goals for treatment: Full Code  Additional follow-up to be provided: Podiatry  Time spent during discussion:20 minutes  Altamese Dilling, MD

## 2018-10-22 NOTE — Consult Note (Signed)
Pharmacy Antibiotic Note  Jeremy [name] is a 63 y.o. male admitted on 10/22/2018 with cellulitis.  Pharmacy has been consulted for Vancomycin dosing.  Plan: Vancomycin 2500 mg IV Q 24 hrs after Loading Dose of 2500mg  that was given in ED. Goal AUC 400-550. Expected AUC: 520.5 Css: 11.4 SCr used: 1.26 T1/2: 11.8 hrs   Height: 5\' 11"  (180.3 cm) Weight: 250 lb (113.4 kg) IBW/kg (Calculated) : 75.3  Temp (24hrs), Avg:98.1 F (36.7 C), Min:97.9 F (36.6 C), Max:98.3 F (36.8 C)  Recent Labs  Lab 10/22/18 1320 10/22/18 1322  WBC  --  5.9  CREATININE  --  1.26*  LATICACIDVEN 2.2*  --     Estimated Creatinine Clearance: 78.8 mL/min (A) (by C-G formula based on SCr of 1.26 mg/dL (H)).    Allergies  Allergen Reactions  . Amoxicillin Swelling  . Keflex [Cephalexin] Rash    Antimicrobials this admission: Vancomycin 5/10 >>  Levaquin 5/10 >>   Dose adjustments this admission:   Microbiology results: 5/10 BCx: pending 5/10 COVID: pending  Thank you for allowing pharmacy to be a part of this patient's care.  Ary Lavine A Cleaven Demario 10/22/2018 9:03 PM

## 2018-10-22 NOTE — ED Provider Notes (Signed)
Medical screening examination/treatment/procedure(s) were conducted as a shared visit with non-physician practitioner(s) and myself.  I personally evaluated the patient during the encounter.     Sharyn Creamer, MD 10/23/18 [phone]

## 2018-10-22 NOTE — ED Notes (Signed)
Patient not tolerating IV placement. 3 attempt with no success, second nurse called for u/s IV access. As per second nurse patient not tolerating IV placement. MD aware. sars sent

## 2018-10-22 NOTE — ED Notes (Signed)
Dietary called for food tray. IV nurse at bedside

## 2018-10-22 NOTE — ED Triage Notes (Signed)
Pt to ED via POV pt c/o right ankle pain. Pt also states that he has a rash on his face and across his shoulders. Pt is in NAD.

## 2018-10-22 NOTE — ED Provider Notes (Addendum)
[facility] Emergency Department Provider Note  ____________________________________________   First MD Initiated Contact with Patient 10/22/18 1300     (approximate)  I have reviewed the triage vital signs and the nursing notes.   HISTORY  Chief Complaint Ankle Pain and Rash    HPI Jeremy [name] is a 63 y.o. male presents emergency department complaining of rash around the ankle, rash on his face with itching and some on the abdomen.  Patient has been on Keflex for a wound infection.  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  When he was here last time he stated Keflex would always clear the infection up.  He was given a prescription for Keflex and a refill on his diabetes medicine at that time.  He did not have any fever or chills.  Today the wound has worsened.  He denies any fever or chills.    Past Medical History:  Diagnosis Date  . Coronary artery disease   . Diabetes mellitus without complication ([place])   . Hypertension   . S/P CABG x 1     Patient Active Problem List   Diagnosis Date Noted  . Pressure injury of skin 02/11/2018  . DKA (diabetic ketoacidoses) ([place]) 02/10/2018    Past Surgical History:  Procedure Laterality Date  . ANKLE ARTHROSCOPY W/ OPEN REPAIR Right   . CORONARY ARTERY BYPASS GRAFT      Prior to Admission medications   Medication Sig Start Date End Date Taking? Authorizing Provider  glimepiride (AMARYL) 2 MG tablet Take 1 tablet (2 mg total) by mouth daily with breakfast. 10/08/18  Yes Hillari Zumwalt, Linden Dolin, PA-C  losartan (COZAAR) 50 MG tablet Take 1 tablet (50 mg total) by mouth daily. 10/08/18 01/06/19 Yes Branson Kranz, Linden Dolin, PA-C  metFORMIN (GLUCOPHAGE) 1000 MG tablet Take 1 tablet (1,000 mg total) by mouth 2 (two) times daily with a meal. 10/08/18  Yes Jenai Scaletta, Linden Dolin, PA-C  sitaGLIPtin (JANUVIA) 100 MG tablet Take 1 tablet (100 mg total) by mouth daily. 10/08/18   Yes Nakkia Mackiewicz, Linden Dolin, PA-C  blood glucose meter kit and supplies KIT Dispense based on patient and insurance preference. Use up to four times daily as directed. (FOR ICD-9 250.00, 250.01). 02/13/18   Gladstone Lighter, MD  cephALEXin (KEFLEX) 500 MG capsule Take 1 capsule (500 mg total) by mouth 3 (three) times daily. Patient not taking: Reported on 10/22/2018 10/08/18   Versie Starks, PA-C    Allergies Amoxicillin and Keflex [cephalexin]  Family History  Problem Relation Age of Onset  . Heart attack Father     Social History Social History   Tobacco Use  . Smoking status: Current Some Day Smoker    Packs/day: 0.50    Types: Cigarettes  . Smokeless tobacco: Former Network engineer Use Topics  . Alcohol use: Yes    Alcohol/week: 1.0 standard drinks    Types: 1 Cans of beer per week  . Drug use: Yes    Types: Marijuana    Review of Systems  Constitutional: No fever/chills Eyes: No visual changes. ENT: No sore throat. Respiratory: Denies cough Genitourinary: Negative for dysuria. Musculoskeletal: Negative for back pain. Skin: Positive for rash and a wound infection   ____________________________________________   PHYSICAL EXAM:  VITAL SIGNS: ED Triage Vitals  Enc Vitals Group     BP 10/22/18 1045 (!) 141/81     Pulse Rate 10/22/18 1045 76  Resp 10/22/18 1045 18     Temp 10/22/18 1045 97.9 F (36.6 C)     Temp Source 10/22/18 1045 Oral     SpO2 10/22/18 1045 98 %     Weight 10/22/18 1045 250 lb (113.4 kg)     Height 10/22/18 1045 '5\' 11"'  (1.803 m)     Head Circumference --      Peak Flow --      Pain Score 10/22/18 1040 5     Pain Loc --      Pain Edu? --      Excl. in [place]? --     Constitutional: Alert and oriented. Well appearing and in no acute distress. Eyes: Conjunctivae are normal.  Head: Atraumatic. Nose: No congestion/rhinnorhea. Mouth/Throat: Mucous membranes are moist.   Neck:  supple no lymphadenopathy noted Cardiovascular: Normal rate,  regular rhythm. Heart sounds are normal Respiratory: Normal respiratory effort.  No retractions, lungs c t a  GU: deferred Musculoskeletal: The right ankle has a large wound noted.  This is worsened since his last visit as on the last visit the skin color was normal.  The area has a 3 inch x 4 inch large area of redness with skin that has sloughed.  Some drainage noted.  Neurovascular is intact  neurologic:  Normal speech and language.  Skin:  Skin is warm, dry and intact.  Drug rash noted to the face chest and abdomen. Psychiatric: Mood and affect are normal. Speech and behavior are normal.  ____________________________________________   LABS (all labs ordered are listed, but only abnormal results are displayed)  Labs Reviewed  CBC WITH DIFFERENTIAL/PLATELET - Abnormal; Notable for the following components:      Result Value   Eosinophils Absolute 0.7 (*)    All other components within normal limits  COMPREHENSIVE METABOLIC PANEL - Abnormal; Notable for the following components:   Creatinine, Ser 1.26 (*)    All other components within normal limits  LACTIC ACID, PLASMA - Abnormal; Notable for the following components:   Lactic Acid, Venous 2.2 (*)    All other components within normal limits  CULTURE, BLOOD (ROUTINE X 2)  CULTURE, BLOOD (ROUTINE X 2)  SARS CORONAVIRUS 2 (HOSPITAL ORDER, [place] [facility])   ____________________________________________   ____________________________________________  RADIOLOGY  X-ray of the right ankle shows a screw has loosened from the hardware and is in the soft tissues  ____________________________________________   PROCEDURES  Procedure(s) performed: Saline lock, Solu-Medrol 125 mg IV, Benadryl 25 mg IV, Pepcid 20 mg IV, vancomycin per pharmacy, morphine 4 mg IV  Procedures    ____________________________________________   INITIAL IMPRESSION / ASSESSMENT AND PLAN / ED COURSE  Pertinent labs & imaging  results that were available during my care of the patient were reviewed by me and considered in my medical decision making (see chart for details).   Patient is a 63 year old male presents emergency department for a rash and wound infection.  Patient has been on Keflex for about a week.  He denies any chest pain or shortness of breath.  Physical exam shows that the patient has a drug rash on the face eyes are swollen, lungs are clear to all station.  The right ankle wound infection has worsened.  Saline lock, normal saline 500 mL IV, Solu-Medrol 125 mg IV, Pepcid 20 mg IV, Benadryl 25 mg IV  X-ray of the right ankle shows a loose piece of hardware in the soft tissues of the right ankle.  CBC  is normal, comprehensive metabolic panel has increased creatinine at 1.26, lactic acid is increased at 2.2, blood cultures are pending and COVID-19 test is pending  Dr. Jacqualine Code in to see the patient.  Agrees patient needs to be admitted.   Hospitalist notified.  We are to call him when the COVID-19 test is back.  Case described and reported to Kern Reap, PA-C.  Care will be transferred to her at this time.  Mirl Hillery was evaluated in Emergency Department on 10/22/2018 for the symptoms described in the history of present illness. He was evaluated in the context of the global COVID-19 pandemic, which necessitated consideration that the patient might be at risk for infection with the SARS-CoV-2 virus that causes COVID-19. Institutional protocols and algorithms that pertain to the evaluation of patients at risk for COVID-19 are in a state of rapid change based on information released by regulatory bodies including the CDC and federal and state organizations. These policies and algorithms were followed during the patient's care in the ED.   As part of my medical decision making, I reviewed the following data within the [facility] notes reviewed and incorporated, Labs reviewed lactic  acid is 2.2 which is elevated, comprehensive metabolic panel shows increased creatinine at 1.26, CBC is normal, covid and blood cultures are pending., Old chart reviewed, Radiograph reviewed x-ray of the right ankle shows a previous nonunion fracture with a loose piece of hardware and soft tissues at the ankle, Discussed with admitting physician hospitalist, Evaluated by EM attending Dr. Jacqualine Code, Notes from prior ED visits and [place] Controlled Substance Database  ____________________________________________   FINAL CLINICAL IMPRESSION(S) / ED DIAGNOSES  Final diagnoses:  Wound infection  Allergic reaction due to antibacterial drug      NEW MEDICATIONS STARTED DURING THIS VISIT:  New Prescriptions   No medications on file     Note:  This document was prepared using Dragon voice recognition software and may include unintentional dictation errors.    Versie Starks, PA-C 10/22/18 1541    Caryn Section Linden Dolin, PA-C 10/22/18 1546    Delman Kitten, MD 10/23/18 2253

## 2018-10-22 NOTE — ED Notes (Signed)
20 g placed by IV nurse to left upper arm via u/s guided. Blood culture x 1 obtained and sent. Dinner tray provided. ABX infusing. Awaiting bed status.

## 2018-10-22 NOTE — ED Provider Notes (Signed)
Medical screening examination/treatment/procedure(s) were conducted as a shared visit with non-physician practitioner(s) and myself.  I personally evaluated the patient during the encounter.   Patient agreeable with plan for admission given his worsening infection.  Also treating for what is probable allergy to cephalexin with urticaria.  He exhibits no signs to suggest anaphylaxis however.  Treat with steroids, also initiated on IV vancomycin.blood cultures and under hospital protocol have ordered coronavirus test for screening prior to admission here  Ongoing care signed Dr. Mayford Knife who is supervising physician assistant     Sharyn Creamer, MD 10/22/18 [phone]

## 2018-10-22 NOTE — ED Provider Notes (Addendum)
  Physical Exam  BP 129/76 (BP Location: Right Arm)   Pulse 88   Temp 98.3 F (36.8 C) (Oral)   Resp 18   Ht 5\' 11"  (1.803 m)   Wt 113.4 kg   SpO2 99%   BMI 34.87 kg/m   Physical Exam  ED Course/Procedures     Procedures  MDM  Assumed care for Greig Right, PA-C.  COVID-19 testing was negative and patient was accepted for admission.  Patient is observed ringing call bell every 2 to 3 minutes.  He has throwm objects on the floor and is asking nursing staff to retrieve them  He is also angry as the first meal brought to him in the emergency department was not adequate.  Patient was told that nursing staff had to attend to other patients in addition to him.  Patient states that he is outraged by the lack of nursing staff and that he does not have one-to-one nursing support.    Pia Mau [place], PA-C 10/22/18 1658    Dionne Bucy, MD 10/22/18 1814    Orvil Feil, PA-C 10/22/18 Cedric Fishman    Dionne Bucy, MD 10/22/18 2355

## 2018-10-22 NOTE — H&P (Signed)
[facility] [place] NAME: Jeremy [name]    MR#:  [id number]  DATE OF BIRTH:  [birth year]  DATE OF ADMISSION:  10/22/2018  PRIMARY CARE PHYSICIAN: Ronnell Freshwater, NP   REQUESTING/REFERRING PHYSICIAN: Quale  CHIEF COMPLAINT:   Chief Complaint  Patient presents with  . Ankle Pain  . Rash    HISTORY OF PRESENT ILLNESS: Polk Minor  is a 63 y.o. male with a known history of coronary artery disease status post CABG, diabetes, hypertension-had right ankle surgery 10 years ago with some deformity.  As per the patient his wound never healed on the right ankle and he does dressing daily with hydrogen peroxide. 2 weeks ago he noted the right ankle wound is getting infected and did not get better in spite of doing daily dressing.  He came to emergency room last week where he was sent home with oral cephalexin.  He took it for 6 days but had some itching and his wound is not getting better so came back to emergency room. Concern with this ER physician gave to hospitalist team for further management as failure of outpatient treatment. X-ray does not show deep involvement of bones. His COVID-19 test is negative. Patient denies any associated fever.  He is able to walk without any difficulties.  PAST MEDICAL HISTORY:   Past Medical History:  Diagnosis Date  . Coronary artery disease   . Diabetes mellitus without complication ([place])   . Hypertension   . S/P CABG x 1     PAST SURGICAL HISTORY:  Past Surgical History:  Procedure Laterality Date  . ANKLE ARTHROSCOPY W/ OPEN REPAIR Right   . CORONARY ARTERY BYPASS GRAFT      SOCIAL HISTORY:  Social History   Tobacco Use  . Smoking status: Current Some Day Smoker    Packs/day: 0.50    Types: Cigarettes  . Smokeless tobacco: Former Network engineer Use Topics  . Alcohol use: Yes    Alcohol/week: 1.0 standard drinks    Types: 1 Cans of beer per week    FAMILY HISTORY:  Family History   Problem Relation Age of Onset  . Heart attack Father     DRUG ALLERGIES:  Allergies  Allergen Reactions  . Amoxicillin Swelling  . Keflex [Cephalexin] Rash    REVIEW OF SYSTEMS:   CONSTITUTIONAL: No fever, fatigue or weakness.  EYES: No blurred or double vision.  EARS, NOSE, AND THROAT: No tinnitus or ear pain.  RESPIRATORY: No cough, shortness of breath, wheezing or hemoptysis.  CARDIOVASCULAR: No chest pain, orthopnea, edema.  GASTROINTESTINAL: No nausea, vomiting, diarrhea or abdominal pain.  GENITOURINARY: No dysuria, hematuria.  ENDOCRINE: No polyuria, nocturia,  HEMATOLOGY: No anemia, easy bruising or bleeding SKIN: No rash or lesion. MUSCULOSKELETAL: No joint pain or arthritis.   NEUROLOGIC: No tingling, numbness, weakness.  PSYCHIATRY: No anxiety or depression.   MEDICATIONS AT HOME:  Prior to Admission medications   Medication Sig Start Date End Date Taking? Authorizing Provider  glimepiride (AMARYL) 2 MG tablet Take 1 tablet (2 mg total) by mouth daily with breakfast. 10/08/18  Yes Fisher, Linden Dolin, PA-C  losartan (COZAAR) 50 MG tablet Take 1 tablet (50 mg total) by mouth daily. 10/08/18 01/06/19 Yes Fisher, Linden Dolin, PA-C  metFORMIN (GLUCOPHAGE) 1000 MG tablet Take 1 tablet (1,000 mg total) by mouth 2 (two) times daily with a meal. 10/08/18  Yes Fisher, Linden Dolin, PA-C  sitaGLIPtin (JANUVIA) 100 MG tablet Take 1  tablet (100 mg total) by mouth daily. 10/08/18  Yes Fisher, Linden Dolin, PA-C  blood glucose meter kit and supplies KIT Dispense based on patient and insurance preference. Use up to four times daily as directed. (FOR ICD-9 250.00, 250.01). 02/13/18   Gladstone Lighter, MD  cephALEXin (KEFLEX) 500 MG capsule Take 1 capsule (500 mg total) by mouth 3 (three) times daily. Patient not taking: Reported on 10/22/2018 10/08/18   Versie Starks, PA-C      PHYSICAL EXAMINATION:   VITAL SIGNS: Blood pressure 129/76, pulse 88, temperature 98.3 F (36.8 C), temperature source  Oral, resp. rate 18, height '5\' 11"'  (1.803 m), weight 113.4 kg, SpO2 99 %.  GENERAL:  63 y.o.-year-old patient lying in the bed with no acute distress.  EYES: Pupils equal, round, reactive to light and accommodation. No scleral icterus. Extraocular muscles intact.  HEENT: Head atraumatic, normocephalic. Oropharynx and nasopharynx clear.  NECK:  Supple, no jugular venous distention. No thyroid enlargement, no tenderness.  LUNGS: Normal breath sounds bilaterally, no wheezing, rales,rhonchi or crepitation. No use of accessory muscles of respiration.  CARDIOVASCULAR: S1, S2 normal. No murmurs, rubs, or gallops.  ABDOMEN: Soft, nontender, nondistended. Bowel sounds present. No organomegaly or mass.  EXTREMITIES: No pedal edema, cyanosis, or clubbing.  Deformity in the right ankle and on the lateral side of the right ankle there is 5 cm size circular open ulcer with deep center and some angry looking surfaces and slough. NEUROLOGIC: Cranial nerves II through XII are intact. Muscle strength 5/5 in all extremities. Sensation intact. Gait not checked.  PSYCHIATRIC: The patient is alert and oriented x 3.  SKIN: No obvious rash, lesion, or ulcer.   LABORATORY PANEL:   CBC Recent Labs  Lab 10/22/18 1322  WBC 5.9  HGB 15.2  HCT 45.4  PLT 272  MCV 94.4  MCH 31.6  MCHC 33.5  RDW 13.8  LYMPHSABS 2.0  MONOABS 0.7  EOSABS 0.7*  BASOSABS 0.0   ------------------------------------------------------------------------------------------------------------------  Chemistries  Recent Labs  Lab 10/22/18 1322  NA 140  K 4.4  CL 110  CO2 22  GLUCOSE 77  BUN 14  CREATININE 1.26*  CALCIUM 9.1  AST 17  ALT 14  ALKPHOS 79  BILITOT 0.5   ------------------------------------------------------------------------------------------------------------------ estimated creatinine clearance is 78.8 mL/min (A) (by C-G formula based on SCr of 1.26 mg/dL  (H)). ------------------------------------------------------------------------------------------------------------------ No results for input(s): TSH, T4TOTAL, T3FREE, THYROIDAB in the last 72 hours.  Invalid input(s): FREET3   Coagulation profile No results for input(s): INR, PROTIME in the last 168 hours. ------------------------------------------------------------------------------------------------------------------- No results for input(s): DDIMER in the last 72 hours. -------------------------------------------------------------------------------------------------------------------  Cardiac Enzymes No results for input(s): CKMB, TROPONINI, MYOGLOBIN in the last 168 hours.  Invalid input(s): CK ------------------------------------------------------------------------------------------------------------------ Invalid input(s): POCBNP  ---------------------------------------------------------------------------------------------------------------  Urinalysis    Component Value Date/Time   COLORURINE STRAW (A) 02/10/2018 0737   APPEARANCEUR CLEAR (A) 02/10/2018 0737   LABSPEC 1.022 02/10/2018 0737   PHURINE 5.0 02/10/2018 0737   GLUCOSEU >=500 (A) 02/10/2018 0737   HGBUR MODERATE (A) 02/10/2018 0737   BILIRUBINUR NEGATIVE 02/10/2018 0737   KETONESUR 80 (A) 02/10/2018 0737   PROTEINUR 30 (A) 02/10/2018 0737   NITRITE NEGATIVE 02/10/2018 0737   LEUKOCYTESUR NEGATIVE 02/10/2018 0737     RADIOLOGY: Dg Ankle Complete Right  Result Date: 10/22/2018 CLINICAL DATA:  Acute right ankle pain and swelling. EXAM: RIGHT ANKLE - COMPLETE 3+ VIEW COMPARISON:  Radiographs of November 20, 2017. FINDINGS: Status post surgical internal fixation of old right  distal fibular fracture, with fusion of the distal right tibia fibular joint. 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 No new fracture or dislocation is noted. At least 1 distal screw has broken off, with a portion sitting in  the overlying soft tissues; this is unchanged compared to prior exam. IMPRESSION: Stable postsurgical, posttraumatic and degenerative changes are noted compared to prior exam. No definite acute abnormality is noted. Electronically Signed   By: Marijo Conception M.D.   On: 10/22/2018 13:26    EKG: Orders placed or performed during the hospital encounter of 02/10/18  . EKG 12-Lead  . EKG 12-Lead  . EKG    IMPRESSION AND PLAN:  *Right ankle infection, diabetic foot infection For now we will try to get blood cultures sample collected and start IV antibiotics. X-ray does not show involvement of wounds. I have called podiatry and orthopedic consults. Pain management as needed.  *Diabetes We will keep on sliding scale coverage but hold oral medication due to active infection. Check hemoglobin A1c.  *Hypertension He takes losartan at home, currently blood pressure is stable.  *Coronary artery disease status post CABG Patient is not on aspirin, continue to monitor.  Currently asymptomatic.  *Acute renal insufficiency Continue to monitor renal function.  *Active smoking Counseled to quit smoking for 4 minutes and offered nicotine patch.  All the records are reviewed and case discussed with ED provider. Management plans discussed with the patient, family and they are in agreement.  CODE STATUS: Full code. Code Status History    Date Active Date Inactive Code Status Order ID Comments User Context   02/10/2018 1348 02/13/2018 1606 Full Code [id number]  Gorden Harms, MD Inpatient   02/10/2018 1348 02/10/2018 1348 Full Code [id number]  Salary, Avel Peace, MD Inpatient       TOTAL TIME TAKING CARE OF THIS PATIENT: 50 minutes.    Vaughan Basta M.D on 10/22/2018   Between 7am to 6pm - Pager - [phone]  After 6pm go to [email or web address] - password [email or web address] [facility]  Office  [phone]  CC: Primary care physician; Ronnell Freshwater, NP   Note: This  dictation was prepared with Dragon dictation along with smaller phrase technology. Any transcriptional errors that result from this process are unintentional.

## 2018-10-22 NOTE — ED Notes (Signed)
Admitting physician at the bedside aware blood cultures were not drawn, IV nurse at bedside if uable to get IV access ok to use what patient has and given ABX w/o drawing the blood cultures.

## 2018-10-22 NOTE — ED Notes (Signed)
Presents to ed with c/o of itchiness all over positive rash like noted to neck, head, ears and abd with vesicle like and drainage. Patient denies pain, reports very itchy x 4 days, also here to have right ankle looked at reports pain and drainage to lateral side of ankle. Reports his screw came out 2 years ago and has not healed since.

## 2018-10-22 NOTE — ED Notes (Signed)
Patient calling nurse continuously for services as he states. Phone provided to call family. Not happy with meal sent up from kitchen, wants a different meal. Called kitchen requesting a different meal.

## 2018-10-23 DIAGNOSIS — M19171 Post-traumatic osteoarthritis, right ankle and foot: Secondary | ICD-10-CM | POA: Insufficient documentation

## 2018-10-23 DIAGNOSIS — Z9889 Other specified postprocedural states: Secondary | ICD-10-CM | POA: Insufficient documentation

## 2018-10-23 DIAGNOSIS — Z8781 Personal history of (healed) traumatic fracture: Secondary | ICD-10-CM | POA: Insufficient documentation

## 2018-10-23 DIAGNOSIS — L97312 Non-pressure chronic ulcer of right ankle with fat layer exposed: Secondary | ICD-10-CM | POA: Insufficient documentation

## 2018-10-23 LAB — BASIC METABOLIC PANEL
Anion gap: 9 (ref 5–15)
BUN: 15 mg/dL (ref 8–23)
CO2: 21 mmol/L — ABNORMAL LOW (ref 22–32)
Calcium: 9 mg/dL (ref 8.9–10.3)
Chloride: 111 mmol/L (ref 98–111)
Creatinine, Ser: 1.06 mg/dL (ref 0.61–1.24)
GFR calc Af Amer: >60 mL/min (ref >60)
GFR calc non Af Amer: >60 mL/min (ref >60)
Glucose, Bld: 155 mg/dL — ABNORMAL HIGH (ref 70–99)
Potassium: 4.7 mmol/L (ref 3.5–5.1)
Sodium: 141 mmol/L (ref 135–145)

## 2018-10-23 LAB — GLUCOSE, CAPILLARY
Glucose-Capillary: 149 mg/dL — ABNORMAL HIGH (ref 70–99)
Glucose-Capillary: 151 mg/dL — ABNORMAL HIGH (ref 70–99)
Glucose-Capillary: 140 mg/dL — ABNORMAL HIGH (ref 70–99)
Glucose-Capillary: 117 mg/dL — ABNORMAL HIGH (ref 70–99)
Glucose-Capillary: 105 mg/dL — ABNORMAL HIGH (ref 70–99)

## 2018-10-23 LAB — CBC
HCT: 41.4 % (ref 39.0–52.0)
Hemoglobin: 13.6 g/dL (ref 13.0–17.0)
MCH: 31.7 pg (ref 26.0–34.0)
MCHC: 32.9 g/dL (ref 30.0–36.0)
MCV: 96.5 fL (ref 80.0–100.0)
Platelets: 243 10*3/uL (ref 150–400)
RBC: 4.29 MIL/uL (ref 4.22–5.81)
RDW: 13.9 % (ref 11.5–15.5)
WBC: 10.7 10*3/uL — ABNORMAL HIGH (ref 4.0–10.5)
nRBC: 0 % (ref 0.0–0.2)

## 2018-10-23 MED ORDER — MUPIROCIN 2 % EX OINT
TOPICAL_OINTMENT | Freq: Two times a day (BID) | CUTANEOUS | Status: DC
  Administered 2018-10-23 – 2018-10-24 (×2): via NASAL
  Filled 2018-10-23: qty 22

## 2018-10-23 MED ORDER — SODIUM CHLORIDE 0.9 % IV SOLN
INTRAVENOUS | Status: DC | PRN
  Administered 2018-10-23 (×2): via INTRAVENOUS

## 2018-10-23 MED ORDER — HYDRALAZINE HCL 20 MG/ML IJ SOLN: 10.0000 mg | Freq: Four times a day (QID) | INTRAMUSCULAR | Status: DC | PRN

## 2018-10-23 MED ORDER — VANCOMYCIN HCL IN DEXTROSE 1-5 GM/200ML-% IV SOLN
1000.0000 mg | Freq: Two times a day (BID) | INTRAVENOUS | Status: DC
  Administered 2018-10-23 – 2018-10-24 (×3): 1000 mg via INTRAVENOUS
  Filled 2018-10-23 (×5): qty 200

## 2018-10-23 MED ORDER — NICOTINE 7 MG/24HR TD PT24
7.0000 mg | MEDICATED_PATCH | Freq: Every day | TRANSDERMAL | Status: DC
  Administered 2018-10-23 – 2018-10-24 (×2): 7 mg via TRANSDERMAL
  Filled 2018-10-23 (×2): qty 1

## 2018-10-23 MED ORDER — DIPHENHYDRAMINE HCL 25 MG PO CAPS
25.0000 mg | ORAL_CAPSULE | Freq: Four times a day (QID) | ORAL | Status: DC | PRN
  Administered 2018-10-23 – 2018-10-24 (×2): 25 mg via ORAL
  Filled 2018-10-23 (×2): qty 1

## 2018-10-23 MED ORDER — FAMOTIDINE 20 MG PO TABS
20.0000 mg | ORAL_TABLET | Freq: Once | ORAL | Status: AC
  Administered 2018-10-23: 07:00:00 20 mg via ORAL
  Filled 2018-10-23: qty 1

## 2018-10-23 MED ORDER — DIPHENHYDRAMINE HCL 25 MG PO CAPS
25.0000 mg | ORAL_CAPSULE | Freq: Once | ORAL | Status: AC
  Administered 2018-10-23: 07:00:00 25 mg via ORAL
  Filled 2018-10-23: qty 1

## 2018-10-23 MED ORDER — GLIMEPIRIDE 2 MG PO TABS
2.0000 mg | ORAL_TABLET | Freq: Every day | ORAL | Status: DC
  Administered 2018-10-24: 11:00:00 2 mg via ORAL
  Filled 2018-10-23 (×2): qty 1

## 2018-10-23 MED ORDER — LINAGLIPTIN 5 MG PO TABS
5.0000 mg | ORAL_TABLET | Freq: Every day | ORAL | Status: DC
  Administered 2018-10-23 – 2018-10-24 (×2): 5 mg via ORAL
  Filled 2018-10-23 (×2): qty 1

## 2018-10-23 NOTE — Progress Notes (Signed)
[facility] [place] [facility]   PATIENT NAME: Jeremy BottomMarshall [name]    MR#:  [id number]  DATE OF BIRTH:  25-Jun-1955  SUBJECTIVE:   Patient here with cellulitis  REVIEW OF SYSTEMS:    Review of Systems  Constitutional: Negative for fever, chills weight loss HENT: Negative for ear pain, nosebleeds, congestion, facial swelling, rhinorrhea, neck pain, neck stiffness and ear discharge.   Respiratory: Negative for cough, shortness of breath, wheezing  Cardiovascular: Negative for chest pain, palpitations and leg swelling.  Gastrointestinal: Negative for heartburn, abdominal pain, vomiting, diarrhea or consitpation Genitourinary: Negative for dysuria, urgency, frequency, hematuria Musculoskeletal: Negative for back pain or joint pain Neurological: Negative for dizziness, seizures, syncope, focal weakness,  numbness and headaches.  Hematological: Does not bruise/bleed easily.  Psychiatric/Behavioral: Negative for hallucinations, confusion, dysphoric mood    Tolerating Diet:yes      DRUG ALLERGIES:   Allergies  Allergen Reactions  . Amoxicillin Swelling  . Keflex [Cephalexin] Rash    VITALS:  Blood pressure (!) 148/82, pulse 72, temperature 97.8 F (36.6 C), temperature source Oral, resp. rate 20, height 5\' 11"  (1.803 m), weight 123.4 kg, SpO2 99 %.  PHYSICAL EXAMINATION:  Constitutional: Appears well-developed and well-nourished. No distress. HENT: Normocephalic. Marland Kitchen. Oropharynx is clear and moist.  Eyes: Conjunctivae and EOM are normal. PERRLA, no scleral icterus.  Neck: Normal ROM. Neck supple. No JVD. No tracheal deviation. CVS: RRR, S1/S2 +, no murmurs, no gallops, no carotid bruit.  Pulmonary: Effort and breath sounds normal, no stridor, rhonchi, wheezes, rales.  Abdominal: Soft. BS +,  no distension, tenderness, rebound or guarding.  Musculoskeletal: Normal range of motion. No edema and no tenderness.  Neuro: Alert. CN 2-12 grossly intact. No focal  deficits. Skin: venous stasis  right ankle is notable for an area of excoriation with a  1 cm diameter area of full-thickness ulceration over the distal fibula.  No hardware visualized at the depth of the wound, but there is a small amount of purulent drainage from this area. Psychiatric: Normal mood and affect.      LABORATORY PANEL:   CBC Recent Labs  Lab 10/23/18 0403  WBC 10.7*  HGB 13.6  HCT 41.4  PLT 243   ------------------------------------------------------------------------------------------------------------------  Chemistries  Recent Labs  Lab 10/22/18 1322 10/23/18 0403  NA 140 141  K 4.4 4.7  CL 110 111  CO2 22 21*  GLUCOSE 77 155*  BUN 14 15  CREATININE 1.26* 1.06  CALCIUM 9.1 9.0  AST 17  --   ALT 14  --   ALKPHOS 79  --   BILITOT 0.5  --    ------------------------------------------------------------------------------------------------------------------  Cardiac Enzymes No results for input(s): TROPONINI in the last 168 hours. ------------------------------------------------------------------------------------------------------------------  RADIOLOGY:  Dg Ankle Complete Right  Result Date: 10/22/2018 CLINICAL DATA:  Acute right ankle pain and swelling. EXAM: RIGHT ANKLE - COMPLETE 3+ VIEW COMPARISON:  Radiographs of November 20, 2017. FINDINGS: Status post surgical internal fixation of old right distal fibular fracture, with fusion of the distal right tibia fibular joint. 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 No new fracture or dislocation is noted. At least 1 distal screw has broken off, with a portion sitting in the overlying soft tissues; this is unchanged compared to prior exam. IMPRESSION: Stable postsurgical, posttraumatic and degenerative changes are noted compared to prior exam. No definite acute abnormality is noted. Electronically Signed   By: Lupita RaiderJames  Green Jr M.D.   On: 10/22/2018 13:26  ASSESSMENT AND PLAN:    63 year old male with history of diabetes and tobacco dependence who presents emergency room due to right foot pain  1.  Nonhealing ulceration over lateral aspect of distal fibula: Patient evaluated by podiatry and orthopedic surgery. Patient at this time does not need surgical intervention. Continue IV antibiotics with vancomycin and Levaquin and local wound care. Okay to be weightbearing Continue wet-to-dry saline dressings with mupirocin meant.  2.  Diabetes: Continue oral medications with sliding scale.  3.  Essential hypertension: Continue Cozaar  4.Tobacco dependence: Patient is encouraged to quit smoking and willing to attempt to quit was assessed. Patient highly motivated.Counseling was provided for 4 minutes.  Management plans discussed with the patient and he is in agreement.  CODE STATUS: full  TOTAL TIME TAKING CARE OF THIS PATIENT: 30 minutes.     POSSIBLE D/C 1-2 days, DEPENDING ON CLINICAL CONDITION.   Adrian Saran M.D on 10/23/2018 at 11:01 AM  Between 7am to 6pm - Pager - [phone] After 6pm go to [email or web address] - password [email or web address]  [facility]  Office  [phone]  CC: Primary care physician; Carlean Jews, NP  Note: This dictation was prepared with Dragon dictation along with smaller phrase technology. Any transcriptional errors that result from this process are unintentional.

## 2018-10-23 NOTE — Consult Note (Signed)
Pharmacy Antibiotic Note  Jeremy [name] is a 63 y.o. male admitted on 10/22/2018 with cellulitis.  Pharmacy has been consulted for Vancomycin dosing. He is also on IV Levaquin. His renal function has improved slightly overnight back to what appears to be his baseline. He received a 2500 mg loading dose of vancomycin in the ED at approximately 1800 yesterday  Plan: --change Vancomycin dose to 1000 mg IV Q 12 hrs  Goal AUC 400-550 Expected AUC: 471 Css: 13.5 SCr used: 1.06 T1/2: 10.0 hrs  Height: 5\' 11"  (180.3 cm) Weight: 272 lb 0.8 oz (123.4 kg) IBW/kg (Calculated) : 75.3  Temp (24hrs), Avg:98.1 F (36.7 C), Min:97.8 F (36.6 C), Max:98.3 F (36.8 C)  Recent Labs  Lab 10/22/18 1320 10/22/18 1322 10/23/18 0403  WBC  --  5.9 10.7*  CREATININE  --  1.26* 1.06  LATICACIDVEN 2.2*  --   --     Estimated Creatinine Clearance: 97.8 mL/min (by C-G formula based on SCr of 1.06 mg/dL).    Allergies  Allergen Reactions  . Amoxicillin Swelling  . Keflex [Cephalexin] Rash    Antimicrobials this admission: Vancomycin 5/10 >>  Levaquin 5/10 >>   Microbiology results: 5/10 BCx: NGTD 5/10 SARS CoV-2: negative  Thank you for allowing pharmacy to be a part of this patient's care.  Lowella Bandy, PharmD 10/23/2018 9:17 AM

## 2018-10-23 NOTE — Consult Note (Signed)
ORTHOPAEDIC CONSULTATION  REQUESTING PHYSICIAN: Bettey Costa, MD  Chief Complaint:   Nonhealing ulcer of right lateral ankle.  History of Present Illness: Jeremy [name] is a 63 y.o. male with a history of hypertension, coronary artery disease, status post CABG x1, and diabetes who lives independently presented to the emergency room last night with a several week history of worsening of a chronic nonhealing wound/ulceration of over the lateral aspect of the right ankle.  Apparently the patient has been treating the ulceration on his own with daily dressing changes, after applying hydrogen peroxide.  He presented emergency room 1 week ago and given oral cephalexin, but his symptoms have persisted, prompting his return to the emergency room.  The patient subsequently was admitted for IV antibiotic therapy and further evaluation.  The patient denies any fevers or chills while at home.  The patient's past medical history is notable for having undergone an open reduction and internal fixation of an unstable distal fibular fracture with placement of a long sideplate with multiple screws, as well as a single syndesmotic screw.  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    Past Medical History:  Diagnosis Date  . Coronary artery disease   . Diabetes mellitus without complication ([place])   . Hypertension   . S/P CABG x 1    Past Surgical History:  Procedure Laterality Date  . ANKLE ARTHROSCOPY W/ OPEN REPAIR Right   . CORONARY ARTERY BYPASS GRAFT     Social History   Socioeconomic History  . Marital status: Married    Spouse name: Not on file  . Number of children: Not on file  . Years of education: Not on file  . Highest education level: Not on file  Occupational History  . Not on file  Social Needs  .  Financial resource strain: Not on file  . Food insecurity:    Worry: Not on file    Inability: Not on file  . Transportation needs:    Medical: Not on file    Non-medical: Not on file  Tobacco Use  . Smoking status: Current Some Day Smoker    Packs/day: 0.50    Types: Cigarettes  . Smokeless tobacco: Former Network engineer and Sexual Activity  . Alcohol use: Yes    Alcohol/week: 1.0 standard drinks    Types: 1 Cans of beer per week  . Drug use: Yes    Types: Marijuana  . Sexual activity: Not on file  Lifestyle  . Physical activity:    Days per week: Not on file    Minutes per session: Not on file  . Stress: Not on file  Relationships  . Social connections:    Talks on phone: Not on file    Gets together: Not on file    Attends religious service: Not on file    Active member of club or organization: Not on file    Attends meetings of clubs or organizations: Not on file    Relationship status: Not on file  Other Topics Concern  . Not on file  Social History Narrative  . Not on file   Family History  Problem Relation Age of Onset  . Heart attack Father    Allergies  Allergen Reactions  . Amoxicillin Swelling  . Keflex [Cephalexin] Rash   Prior to Admission medications   Medication Sig Start Date End Date Taking?  Authorizing Provider  glimepiride (AMARYL) 2 MG tablet Take 1 tablet (2 mg total) by mouth daily with breakfast. 10/08/18  Yes Fisher, Linden Dolin, PA-C  losartan (COZAAR) 50 MG tablet Take 1 tablet (50 mg total) by mouth daily. 10/08/18 01/06/19 Yes Fisher, Linden Dolin, PA-C  metFORMIN (GLUCOPHAGE) 1000 MG tablet Take 1 tablet (1,000 mg total) by mouth 2 (two) times daily with a meal. 10/08/18  Yes Fisher, Linden Dolin, PA-C  sitaGLIPtin (JANUVIA) 100 MG tablet Take 1 tablet (100 mg total) by mouth daily. 10/08/18  Yes Fisher, Linden Dolin, PA-C  blood glucose meter kit and supplies KIT Dispense based on patient and insurance preference. Use up to four times daily as directed. (FOR  ICD-9 250.00, 250.01). 02/13/18   Gladstone Lighter, MD  cephALEXin (KEFLEX) 500 MG capsule Take 1 capsule (500 mg total) by mouth 3 (three) times daily. Patient not taking: Reported on 10/22/2018 10/08/18   Versie Starks, PA-C   Dg Ankle Complete Right  Result Date: 10/22/2018 CLINICAL DATA:  Acute right ankle pain and swelling. EXAM: RIGHT ANKLE - COMPLETE 3+ VIEW COMPARISON:  Radiographs of November 20, 2017. FINDINGS: Status post surgical internal fixation of old right distal fibular fracture, with fusion of the distal right tibia fibular joint. 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 No new fracture or dislocation is noted. At least 1 distal screw has broken off, with a portion sitting in the overlying soft tissues; this is unchanged compared to prior exam. IMPRESSION: Stable postsurgical, posttraumatic and degenerative changes are noted compared to prior exam. No definite acute abnormality is noted. Electronically Signed   By: Marijo Conception M.D.   On: 10/22/2018 13:26    Positive ROS: All other systems have been reviewed and were otherwise negative with the exception of those mentioned in the HPI and as above.  Physical Exam: General:  Alert, no acute distress Psychiatric:  Patient is competent for consent with normal mood and affect   Cardiovascular:  No pedal edema Respiratory:  No wheezing, non-labored breathing GI:  Abdomen is soft and non-tender Skin:  No lesions in the area of chief complaint Neurologic:  Sensation intact distally Lymphatic:  No axillary or cervical lymphadenopathy  Orthopedic Exam:  Orthopedic examination is limited to the right lower leg and foot.  Skin inspection of the right ankle is notable for an area of excoriation measuring approximately 3.5 x 5 cm with a central 1 cm diameter area of full-thickness ulceration over the distal fibula.  No hardware is actually visualized at the depth of the wound, but there is a small amount of purulent  drainage from this area.  Mild swelling and erythema is noted around the wound, but there is no evidence of an ankle effusion.  He is able dorsiflex and plantarflex his toes and ankle without pain.  Sensation is intact, although somewhat diminished diffusely over all aspects of his right foot.  He has good capillary refill to his right foot.  X-rays:  X-rays of his right ankle are available for review and have been reviewed by myself.  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  There is a long one third tubular plate with 9 screws present along the lateral aspect of the distal fibula.  Several of the screws do not appear to be embedded in bone.  No acute fractures, evidence of chronic osteomyelitis, or  lytic lesions are identified.  Assessment: 1.  Nonhealing ulceration over lateral aspect of distal fibula. 2.  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  Plan: The treatment options have been discussed with the patient.  Based on the patient's history and assessment of the wound, I feel that the patient would have the best chance to heal the wound with hardware removal.  However, this would require a long incision which raises the concern that this long incision would not heal.  Furthermore, the patient does not wish to consider any type of surgical intervention at this time, stating that he had been told in the past that he should have his hardware removed, but he did not want to have it done then and does not want to have it done now.  Therefore, I would defer to Dr. Elvina Mattes for wound management.  Meanwhile, I would recommend that IV antibiotics for several days be continued before converting him to p.o. antibiotics, understanding that this most likely would be a  suppressive situation and that he may need to be on chronic antibiotic therapy.  Thank you for asking me to participate in the care of this most pleasant yet unfortunate man.  I will be happy to follow him with you.   Pascal Lux, MD  Beeper #:  [phone]  10/23/2018 7:58 AM

## 2018-10-23 NOTE — Consult Note (Signed)
[facility]iat[facility]                                                      Patient Demographics  Jeremy [name], is a 63 y.o. male   MRN: [id number]   DOB - 1955/07/28  Admit Date - 10/22/2018    Outpatient Primary MD for the patient is Ronnell Freshwater, NP  Consult requested in the Hospital by Bettey Costa, MD, On 10/23/2018    Reason for consult diabetic wound right ankle   With History of -  Past Medical History:  Diagnosis Date  . Coronary artery disease   . Diabetes mellitus without complication ([place])   . Hypertension   . S/P CABG x 1       Past Surgical History:  Procedure Laterality Date  . ANKLE ARTHROSCOPY W/ OPEN REPAIR Right   . CORONARY ARTERY BYPASS GRAFT      in for   Chief Complaint  Patient presents with  . Ankle Pain  . Rash     HPI  Jeremy [name]  is a 63 y.o. male, patient underwent ankle fracture surgery 6- 7 years ago and ultimately developed a malunion to the fibular shaft.  He states that the ankle position has been abnormal since that time frame.  He is diabetic with likely some peripheral neuropathy.  Wound on his right ankle see states is gotten worse over the last for 5 days.  He states did have a screw that came out of that area several years ago which he removed and the area healed up.    Review of Systems patient is alert and well oriented  In addition to the HPI above,  No Fever-chills, No Headache, No changes with Vision or hearing, No problems swallowing food or Liquids, No Chest pain, Cough or Shortness of Breath, No Abdominal pain, No Nausea or Vommitting, Bowel movements are regular, No Blood in stool or Urine, No dysuria, Skin avulsion ulceration the right ankle No new joints pains-aches,  No new weakness, tingling, numbness in any extremity, No recent weight gain or loss, No polyuria, polydypsia  or polyphagia, No significant Mental Stressors.  A full 10 point Review of Systems was done, except as stated above, all other Review of Systems were negative.   Social History Social History   Tobacco Use  . Smoking status: Current Some Day Smoker    Packs/day: 0.50    Types: Cigarettes  . Smokeless tobacco: Former Network engineer Use Topics  . Alcohol use: Yes    Alcohol/week: 1.0 standard drinks    Types: 1 Cans of beer per week    Family History Family History  Problem Relation Age of Onset  . Heart attack Father     Prior to Admission medications   Medication Sig Start Date End Date Taking? Authorizing Provider  glimepiride (AMARYL) 2 MG tablet Take 1 tablet (2 mg total) by mouth daily with breakfast. 10/08/18  Yes Fisher, Linden Dolin, PA-C  losartan (COZAAR) 50 MG tablet Take 1 tablet (50 mg total) by mouth daily. 10/08/18 01/06/19 Yes Fisher, Linden Dolin, PA-C  metFORMIN (GLUCOPHAGE) 1000 MG tablet Take 1 tablet (1,000 mg total) by mouth 2 (two) times daily with a meal. 10/08/18  Yes Fisher, Linden Dolin, PA-C  sitaGLIPtin (JANUVIA) 100 MG tablet Take 1 tablet (100 mg  total) by mouth daily. 10/08/18  Yes Fisher, Linden Dolin, PA-C  blood glucose meter kit and supplies KIT Dispense based on patient and insurance preference. Use up to four times daily as directed. (FOR ICD-9 250.00, 250.01). 02/13/18   Gladstone Lighter, MD  cephALEXin (KEFLEX) 500 MG capsule Take 1 capsule (500 mg total) by mouth 3 (three) times daily. Patient not taking: Reported on 10/22/2018 10/08/18   Versie Starks, PA-C    Anti-infectives (From admission, onward)   Start     Dose/Rate Route Frequency Ordered Stop   10/23/18 0800  vancomycin (VANCOCIN) 2,500 mg in sodium chloride 0.9 % 500 mL IVPB     2,500 mg 250 mL/hr over 120 Minutes Intravenous Every 24 hours 10/22/18 2139     10/22/18 1900  levofloxacin (LEVAQUIN) IVPB 750 mg     750 mg 100 mL/hr over 90 Minutes Intravenous Every 24 hours 10/22/18 1813      10/22/18 1500  vancomycin (VANCOCIN) 2,500 mg in sodium chloride 0.9 % 500 mL IVPB     2,500 mg 250 mL/hr over 120 Minutes Intravenous  Once 10/22/18 1453 10/22/18 2041      Scheduled Meds: . heparin  5,000 Units Subcutaneous Q8H  . insulin aspart  0-5 Units Subcutaneous QHS  . insulin aspart  0-9 Units Subcutaneous TID WC  . losartan  50 mg Oral Daily  . nicotine  7 mg Transdermal Daily   Continuous Infusions: . levofloxacin (LEVAQUIN) IV Stopped (10/22/18 2132)  . vancomycin     PRN Meds:.docusate sodium, hydrALAZINE, morphine injection  Allergies  Allergen Reactions  . Amoxicillin Swelling  . Keflex [Cephalexin] Rash    Physical Exam  Vitals  Blood pressure (!) 148/82, pulse 72, temperature 97.8 F (36.6 C), temperature source Oral, resp. rate 20, height '5\' 11"'  (1.803 m), weight 123.4 kg, SpO2 99 %.  Lower Extremity exam:  Vascular: Palpable but diminished.  Patient may have some stasis issues around the lateral ankle and around the scar tissue around the region as well.  States he does get a lot of swelling to the region but it appears to have a venous stasis issue associated with that area around the lateral malleolar ulcer.  Dermatological: Palpable but diminished  Neurological: Some diabetic neuropathy  Ortho: Old ankle fracture with malunion to the fibular shaft area.  This resulted in a valgus deformity of the ankle and x-rays reviewed by me today show significant arthritis to the ankle joint this timeframe.  Broken screw was noted in the region and the screw was backed out into the subcutaneous tissue this is well distal to the ulcer site.  He states that a screw did protrude few years ago and he removed it himself but the wound went on to heal up at that point.  Data Review  CBC Recent Labs  Lab 10/22/18 1322 10/23/18 0403  WBC 5.9 10.7*  HGB 15.2 13.6  HCT 45.4 41.4  PLT 272 243  MCV 94.4 96.5  MCH 31.6 31.7  MCHC 33.5 32.9  RDW 13.8 13.9   LYMPHSABS 2.0  --   MONOABS 0.7  --   EOSABS 0.7*  --   BASOSABS 0.0  --    ------------------------------------------------------------------------------------------------------------------  Chemistries  Recent Labs  Lab 10/22/18 1322 10/23/18 0403  NA 140 141  K 4.4 4.7  CL 110 111  CO2 22 21*  GLUCOSE 77 155*  BUN 14 15  CREATININE 1.26* 1.06  CALCIUM 9.1 9.0  AST 17  --  ALT 14  --   ALKPHOS 79  --   BILITOT 0.5  --    ------------------------------------------------------------------------------------------------------------------ estimated creatinine clearance is 97.8 mL/min (by C-G formula based on SCr of 1.06 mg/dL). ------------------------------------------------------------------------------------------------------------------ No results for input(s): TSH, T4TOTAL, T3FREE, THYROIDAB in the last 72 hours.  Invalid input(s): FREET3 Urinalysis    Component Value Date/Time   COLORURINE STRAW (A) 02/10/2018 0737   APPEARANCEUR CLEAR (A) 02/10/2018 0737   LABSPEC 1.022 02/10/2018 0737   PHURINE 5.0 02/10/2018 0737   GLUCOSEU >=500 (A) 02/10/2018 0737   HGBUR MODERATE (A) 02/10/2018 0737   BILIRUBINUR NEGATIVE 02/10/2018 0737   KETONESUR 80 (A) 02/10/2018 0737   PROTEINUR 30 (A) 02/10/2018 0737   NITRITE NEGATIVE 02/10/2018 0737   LEUKOCYTESUR NEGATIVE 02/10/2018 0737     Imaging results:   Dg Ankle Complete Right  Result Date: 10/22/2018 CLINICAL DATA:  Acute right ankle pain and swelling. EXAM: RIGHT ANKLE - COMPLETE 3+ VIEW COMPARISON:  Radiographs of November 20, 2017. FINDINGS: Status post surgical internal fixation of old right distal fibular fracture, with fusion of the distal right tibia fibular joint. 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 No new fracture or dislocation is noted. At least 1 distal screw has broken off, with a portion sitting in the overlying soft tissues; this is unchanged compared to prior exam. IMPRESSION: Stable  postsurgical, posttraumatic and degenerative changes are noted compared to prior exam. No definite acute abnormality is noted. Electronically Signed   By: Marijo Conception M.D.   On: 10/22/2018 13:26    Assessment & Plan: Mostly patient needs local wound care and antibiotics at this point.  Does not appear to have a severe cellulitis to the region.  There is some skin breakdown which could be associated more with stasis dermatitis and breakdown of skin secondary to that.  The indented area at the lateral fibular distal shaft does not appear to be infected there is no purulence draining from that region it appears to be heavily scarred in that area.  All ordered for right now just a wet-to-dry saline dressing with mupirocin ointment applied to the region.  It is okay for him to be weightbearing.  IV antibiotics for his hospital duration but oral antibiotics can be utilized after that. On secondary note patient did advise on admission put weight on this right ankle after surgery.  Principal Problem:   Diabetic foot infection [facility])   Family Communication: Plan discussed with patient   Albertine Patricia M.D on 10/23/2018 at 8:20 AM  Thank you for the consult, we will follow the patient with you in the Hospital.

## 2018-10-24 LAB — CBC
HCT: 39.6 % (ref 39.0–52.0)
Hemoglobin: 13.1 g/dL (ref 13.0–17.0)
MCH: 31 pg (ref 26.0–34.0)
MCHC: 33.1 g/dL (ref 30.0–36.0)
MCV: 93.6 fL (ref 80.0–100.0)
Platelets: 279 10*3/uL (ref 150–400)
RBC: 4.23 MIL/uL (ref 4.22–5.81)
RDW: 13.8 % (ref 11.5–15.5)
WBC: 10.6 10*3/uL — ABNORMAL HIGH (ref 4.0–10.5)
nRBC: 0 % (ref 0.0–0.2)

## 2018-10-24 LAB — GLUCOSE, CAPILLARY
Glucose-Capillary: 103 mg/dL — ABNORMAL HIGH (ref 70–99)
Glucose-Capillary: 120 mg/dL — ABNORMAL HIGH (ref 70–99)

## 2018-10-24 LAB — VANCOMYCIN, RANDOM: Vancomycin Rm: 11

## 2018-10-24 MED ORDER — CIPROFLOXACIN HCL 500 MG PO TABS: 500.0000 mg | ORAL_TABLET | Freq: Two times a day (BID) | ORAL | 0 refills | Status: AC

## 2018-10-24 MED ORDER — SILVER SULFADIAZINE 1 % EX CREA: TOPICAL_CREAM | CUTANEOUS | 1 refills | Status: DC

## 2018-10-24 NOTE — Discharge Summary (Signed)
[facility] [place] NAME: Jeremy [name]    MR#:  [id number]  DATE OF BIRTH:  08/21/1955  DATE OF ADMISSION:  10/22/2018 ADMITTING PHYSICIAN: Vaughan Basta, MD  DATE OF DISCHARGE: 10/24/2018   PRIMARY CARE PHYSICIAN: Ronnell Freshwater, NP    ADMISSION DIAGNOSIS:  Wound infection [T14.8XXA, L08.9] Allergic reaction due to antibacterial drug [T36.95XA]  DISCHARGE DIAGNOSIS:  Principal Problem:   Diabetic foot infection ([place])   SECONDARY DIAGNOSIS:   Past Medical History:  Diagnosis Date  . Coronary artery disease   . Diabetes mellitus without complication ([place])   . Hypertension   . S/P CABG x 1     HOSPITAL COURSE:  63 year old male with history of diabetes and tobacco dependence who presents emergency room due to right foot pain  1.  Nonhealing ulceration over lateral aspect of distal fibula: Patient was evaluated by podiatry and orthopedic surgery. Patient at this time does not need surgical intervention. He will be discharged on oral ciprofloxacin.  Recommendations are to continue dressing changes with wet-to-dry and Silvadene.  Patient will need Curlex from below the knee to toes and then Ace bandage over that.  He is okay to be weightbearing and will need outpatient podiatry follow-up.  2.  Diabetes: Continue ADA diet and outpatient regimen3.  Essential hypertension: Continue Cozaar  4.Tobacco dependence: Patient is encouraged to quit smoking and willing to attempt to quit was assessed. Patient highly motivated.Counseling was provided.  DISCHARGE CONDITIONS AND DIET:   Stable for discharge diabetic diet  CONSULTS OBTAINED:    DRUG ALLERGIES:   Allergies  Allergen Reactions  . Amoxicillin Swelling  . Keflex [Cephalexin] Rash    DISCHARGE MEDICATIONS:   Allergies as of 10/24/2018      Reactions   Amoxicillin Swelling   Keflex [cephalexin] Rash      Medication List    STOP taking these  medications   cephALEXin 500 MG capsule Commonly known as:  KEFLEX     TAKE these medications   blood glucose meter kit and supplies Kit Dispense based on patient and insurance preference. Use up to four times daily as directed. (FOR ICD-9 250.00, 250.01).   ciprofloxacin 500 MG tablet Commonly known as:  Cipro Take 1 tablet (500 mg total) by mouth 2 (two) times daily for 7 days.   glimepiride 2 MG tablet Commonly known as:  AMARYL Take 1 tablet (2 mg total) by mouth daily with breakfast.   losartan 50 MG tablet Commonly known as:  Cozaar Take 1 tablet (50 mg total) by mouth daily.   metFORMIN 1000 MG tablet Commonly known as:  GLUCOPHAGE Take 1 tablet (1,000 mg total) by mouth 2 (two) times daily with a meal.   sitaGLIPtin 100 MG tablet Commonly known as:  Januvia Take 1 tablet (100 mg total) by mouth daily.         Today   CHIEF COMPLAINT:   No acute issues overnight.  Patient is doing well.   VITAL SIGNS:  Blood pressure 140/77, pulse (!) 59, temperature (!) 97.5 F (36.4 C), temperature source Oral, resp. rate 20, height _0  (1.803 m), weight 123.4 kg, SpO2 98 %.   REVIEW OF SYSTEMS:  Review of Systems  Constitutional: Negative.  Negative for chills, fever and malaise/fatigue.  HENT: Negative.  Negative for ear discharge, ear pain, hearing loss, nosebleeds and sore throat.   Eyes: Negative.  Negative for blurred vision and pain.  Respiratory: Negative.  Negative for  cough, hemoptysis, shortness of breath and wheezing.   Cardiovascular: Negative.  Negative for chest pain, palpitations and leg swelling.  Gastrointestinal: Negative.  Negative for abdominal pain, blood in stool, diarrhea, nausea and vomiting.  Genitourinary: Negative.  Negative for dysuria.  Musculoskeletal: Negative.  Negative for back pain.  Skin: Negative.        Ulcer heel   Neurological: Negative for dizziness, tremors, speech change, focal weakness, seizures and headaches.   Endo/Heme/Allergies: Negative.  Does not bruise/bleed easily.  Psychiatric/Behavioral: Negative.  Negative for depression, hallucinations and suicidal ideas.     PHYSICAL EXAMINATION:  GENERAL:  63 y.o.-year-old patient lying in the bed with no acute distress.  NECK:  Supple, no jugular venous distention. No thyroid enlargement, no tenderness.  LUNGS: Normal breath sounds bilaterally, no wheezing, rales,rhonchi  No use of accessory muscles of respiration.  CARDIOVASCULAR: S1, S2 normal. No murmurs, rubs, or gallops.  ABDOMEN: Soft, non-tender, non-distended. Bowel sounds present. No organomegaly or mass.  EXTREMITIES: No pedal edema, cyanosis, or clubbing.  PSYCHIATRIC: The patient is alert and oriented x 3.  SKIN:wrapped  DATA REVIEW:   CBC Recent Labs  Lab 10/24/18 0510  WBC 10.6*  HGB 13.1  HCT 39.6  PLT 279    Chemistries  Recent Labs  Lab 10/22/18 1322 10/23/18 0403  NA 140 141  K 4.4 4.7  CL 110 111  CO2 22 21*  GLUCOSE 77 155*  BUN 14 15  CREATININE 1.26* 1.06  CALCIUM 9.1 9.0  AST 17  --   ALT 14  --   ALKPHOS 79  --   BILITOT 0.5  --     Cardiac Enzymes No results for input(s): TROPONINI in the last 168 hours.  Microbiology Results  _0 @  RADIOLOGY:  Dg Ankle Complete Right  Result Date: 10/22/2018 CLINICAL DATA:  Acute right ankle pain and swelling. EXAM: RIGHT ANKLE - COMPLETE 3+ VIEW COMPARISON:  Radiographs of November 20, 2017. FINDINGS: Status post surgical internal fixation of old right distal fibular fracture, with fusion of the distal right tibia fibular joint. 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 No new fracture or dislocation is noted. At least 1 distal screw has broken off, with a portion sitting in the overlying soft tissues; this is unchanged compared to prior exam. IMPRESSION: Stable postsurgical, posttraumatic and degenerative changes are noted compared to prior exam. No definite acute abnormality is noted.  Electronically Signed   By: Marijo Conception M.D.   On: 10/22/2018 13:26      Allergies as of 10/24/2018      Reactions   Amoxicillin Swelling   Keflex [cephalexin] Rash      Medication List    STOP taking these medications   cephALEXin 500 MG capsule Commonly known as:  KEFLEX     TAKE these medications   blood glucose meter kit and supplies Kit Dispense based on patient and insurance preference. Use up to four times daily as directed. (FOR ICD-9 250.00, 250.01).   ciprofloxacin 500 MG tablet Commonly known as:  Cipro Take 1 tablet (500 mg total) by mouth 2 (two) times daily for 7 days.   glimepiride 2 MG tablet Commonly known as:  AMARYL Take 1 tablet (2 mg total) by mouth daily with breakfast.   losartan 50 MG tablet Commonly known as:  Cozaar Take 1 tablet (50 mg total) by mouth daily.   metFORMIN 1000 MG tablet Commonly known as:  GLUCOPHAGE Take 1 tablet (1,000 mg total)  by mouth 2 (two) times daily with a meal.   sitaGLIPtin 100 MG tablet Commonly known as:  Januvia Take 1 tablet (100 mg total) by mouth daily.         Management plans discussed with the patient and he is in agreement. Stable for discharge home with [facility]  Patient should follow up with podiatry  CODE STATUS:     Code Status Orders  (From admission, onward)         Start     Ordered   10/22/18 2136  Full code  Continuous     10/22/18 2135        Code Status History    Date Active Date Inactive Code Status Order ID Comments User Context   02/10/2018 1348 02/13/2018 1606 Full Code [id number]  Gorden Harms, MD Inpatient   02/10/2018 1348 02/10/2018 1348 Full Code [id number]  Salary, Avel Peace, MD Inpatient    Advance Directive Documentation     Most Recent Value  Type of Advance Directive  Healthcare Power of [place], Living will  Pre-existing out of facility DNR order (yellow form or pink MOST form)  -  "MOST" Form in Place?  -      TOTAL TIME TAKING CARE OF THIS PATIENT: 38  minutes.    Note: This dictation was prepared with Dragon dictation along with smaller phrase technology. Any transcriptional errors that result from this process are unintentional.  Bettey Costa M.D on 10/24/2018 at 11:41 AM  Between 7am to 6pm - Pager - [phone] After 6pm go to [email or web address] - password [email or web address] [place] [facility]  Office  [phone]  CC: Primary care physician; Ronnell Freshwater, NP

## 2018-10-24 NOTE — TOC Transition Note (Signed)
Transition of Care [facility]) - CM/SW Discharge Note   Patient Details  Name: Jeremy [name] MRN: [id number] Date of Birth: 1956-05-09  [facility]) CM/SW Contact:  Chapman Fitch, RN Phone Number: 10/24/2018, 3:16 PM   Clinical Narrative:    Patient to discharge today. Patient states he resides with significant other of 30 years.  PCP [place].  Pharmacy [facility].  Patient denies issues with transportation. Patient denies issues obtaining medications.  Patient states that he has a cane and walking stick at home.  Home health orders have been written for RN, PT, and aide.  Patient agreeable to services.  CMS Medicare.gov Compare Post Acute Care list reviewed with patient.   RNCM confirmed address and contact information.  Patient states he does not have a preference of home health agency.  Referral accepted by Barbara Cower from [facility].   Final next level of care: Home w Home Health Services Barriers to Discharge: Barriers Resolved   Patient Goals and CMS Choice Patient states their goals for this hospitalization and ongoing recovery are:: I want to get some cornbread CMS Medicare.gov Compare Post Acute Care list provided to:: Patient Choice offered to / list presented to : Patient  Discharge Placement                       Discharge Plan and Services   Discharge Planning Services: CM Consult Post Acute Care Choice: Home Health                    HH Arranged: RN, PT, Nurse's Aide [facility] Agency: [facility] Date [facility] Agency Contacted: 10/24/18 Time HH Agency Contacted: ([phone]) Representative spoke with at [facility] Agency: Barbara Cower  Social Determinants of Health (SDOH) Interventions     Readmission Risk Interventions Readmission Risk Prevention Plan 02/13/2018  PCP or Specialist Appt within 5-7 Days Complete  Some recent data might be hidden

## 2018-10-24 NOTE — Progress Notes (Signed)
Discharge order received. Patient is alert and oriented. Vital signs stable . No signs of acute distress. Discharge instructions given. Patient verbalized understanding. No other issues noted at this time.   

## 2018-10-24 NOTE — Progress Notes (Signed)
Subjective: No new complaints.  Patient remains afebrile.   Objective: Vital signs in last 24 hours: Temp:  [97.4 F (36.3 C)-98.8 F (37.1 C)] 97.5 F (36.4 C) (05/12 0401) Pulse Rate:  [57-75] 59 (05/12 0401) Resp:  [20] 20 (05/12 0401) BP: (140-160)/(61-77) 140/77 (05/12 0401) SpO2:  [97 %-100 %] 98 % (05/12 0401)  Intake/Output from previous day: 05/11 0701 - 05/12 0700 In: 1847.5 [P.O.:1080; I.V.:355; IV Piggyback:412.5] Out: 350 [Urine:350] Intake/Output this shift: No intake/output data recorded.  Recent Labs    10/22/18 1322 10/23/18 0403 10/24/18 0510  HGB 15.2 13.6 13.1   Recent Labs    10/23/18 0403 10/24/18 0510  WBC 10.7* 10.6*  RBC 4.29 4.23  HCT 41.4 39.6  PLT 243 279   Recent Labs    10/22/18 1322 10/23/18 0403  NA 140 141  K 4.4 4.7  CL 110 111  CO2 22 21*  BUN 14 15  CREATININE 1.26* 1.06  GLUCOSE 77 155*  CALCIUM 9.1 9.0   No results for input(s): LABPT, INR in the last 72 hours.  Physical Exam: Orthopedic examination again is limited to the left right ankle region.  His examination findings are unchanged as compared to yesterday.  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  Plan: The patient will continue to receive daily dressing changes as recommended by podiatry.  He will continue to receive IV antibiotics for the next 24 to 48 hours before being switched over to p.o. antibiotics.  He will follow-up with podiatry.   Thank you for asking me to participate in the care of this most pleasant man.  I will sign off at this time.  Please re-consult me if you have any further questions or concerns.   Jeremy [name] 10/24/2018, 7:58 AM

## 2018-10-27 LAB — CULTURE, BLOOD (ROUTINE X 2)
Culture: NO GROWTH
Special Requests: ADEQUATE

## 2018-10-28 LAB — CULTURE, BLOOD (SINGLE)
Culture: NO GROWTH
Special Requests: ADEQUATE

## 2018-10-30 ENCOUNTER — Other Ambulatory Visit: Payer: Self-pay

## 2018-10-30 ENCOUNTER — Ambulatory Visit: Payer: Medicaid Other | Admitting: Adult Health

## 2018-10-30 ENCOUNTER — Encounter: Payer: Self-pay | Admitting: Adult Health

## 2018-10-30 VITALS — BP 154/84 | HR 81 | Resp 16 | Ht 71.0 in | Wt 272.4 lb

## 2018-10-30 DIAGNOSIS — F17200 Nicotine dependence, unspecified, uncomplicated: Secondary | ICD-10-CM

## 2018-10-30 DIAGNOSIS — Z6837 Body mass index (BMI) 37.0-37.9, adult: Secondary | ICD-10-CM

## 2018-10-30 DIAGNOSIS — N529 Male erectile dysfunction, unspecified: Secondary | ICD-10-CM | POA: Diagnosis not present

## 2018-10-30 DIAGNOSIS — I10 Essential (primary) hypertension: Secondary | ICD-10-CM

## 2018-10-30 DIAGNOSIS — Z794 Long term (current) use of insulin: Secondary | ICD-10-CM

## 2018-10-30 DIAGNOSIS — E119 Type 2 diabetes mellitus without complications: Secondary | ICD-10-CM | POA: Diagnosis not present

## 2018-10-30 NOTE — Progress Notes (Signed)
[facility] [place],  [id number]  Internal MEDICINE  Office Visit Note  Patient Name: Jeremy [name]  [id number]  [id number]  Date of Service: 10/30/2018   Complaints/HPI Pt is here for establishment of PCP. Chief Complaint  Patient presents with  . Establish Care  . Diabetes   HPI Pt is here to establish care.  He reports a history of diabetes, HTN, Erectile dysfunction.  He reports a history of ankle surgery and open heart surgery.        Current Medication: Outpatient Encounter Medications as of 10/30/2018  Medication Sig  . blood glucose meter kit and supplies KIT Dispense based on patient and insurance preference. Use up to four times daily as directed. (FOR ICD-9 250.00, 250.01).  . ciprofloxacin (CIPRO) 500 MG tablet Take 1 tablet (500 mg total) by mouth 2 (two) times daily for 7 days.  Marland Kitchen glimepiride (AMARYL) 2 MG tablet Take 1 tablet (2 mg total) by mouth daily with breakfast.  . losartan (COZAAR) 50 MG tablet Take 1 tablet (50 mg total) by mouth daily.  . metFORMIN (GLUCOPHAGE) 1000 MG tablet Take 1 tablet (1,000 mg total) by mouth 2 (two) times daily with a meal.  . sitaGLIPtin (JANUVIA) 100 MG tablet Take 1 tablet (100 mg total) by mouth daily.  . silver sulfADIAZINE (SILVADENE) 1 % cream Apply to affected area daily (Patient not taking: Reported on 10/30/2018)   No facility-administered encounter medications on file as of 10/30/2018.     Surgical History: Past Surgical History:  Procedure Laterality Date  . ANKLE ARTHROSCOPY W/ OPEN REPAIR Right   . CORONARY ARTERY BYPASS GRAFT      Medical History: Past Medical History:  Diagnosis Date  . Coronary artery disease   . Diabetes mellitus without complication ([place])   . Hypertension   . S/P CABG x 1     Family History: Family History  Problem Relation Age of Onset  . Heart attack Father     Social History   Socioeconomic History  . Marital status: Married    Spouse name:  Not on file  . Number of children: Not on file  . Years of education: Not on file  . Highest education level: Not on file  Occupational History  . Not on file  Social Needs  . Financial resource strain: Not on file  . Food insecurity:    Worry: Not on file    Inability: Not on file  . Transportation needs:    Medical: Not on file    Non-medical: Not on file  Tobacco Use  . Smoking status: Current Some Day Smoker    Packs/day: 0.50    Types: Cigarettes  . Smokeless tobacco: Former Network engineer and Sexual Activity  . Alcohol use: Not Currently  . Drug use: Yes    Types: Marijuana  . Sexual activity: Not on file  Lifestyle  . Physical activity:    Days per week: Not on file    Minutes per session: Not on file  . Stress: Not on file  Relationships  . Social connections:    Talks on phone: Not on file    Gets together: Not on file    Attends religious service: Not on file    Active member of club or organization: Not on file    Attends meetings of clubs or organizations: Not on file    Relationship status: Not on file  . Intimate partner violence:    Fear  of current or ex partner: Not on file    Emotionally abused: Not on file    Physically abused: Not on file    Forced sexual activity: Not on file  Other Topics Concern  . Not on file  Social History Narrative  . Not on file     Review of Systems  Constitutional: Negative.  Negative for chills, fatigue and unexpected weight change.  HENT: Negative.  Negative for congestion, rhinorrhea, sneezing and sore throat.   Eyes: Negative for redness.  Respiratory: Negative.  Negative for cough, chest tightness and shortness of breath.   Cardiovascular: Negative.  Negative for chest pain and palpitations.  Gastrointestinal: Negative.  Negative for abdominal pain, constipation, diarrhea, nausea and vomiting.  Endocrine: Negative.   Genitourinary: Negative.  Negative for dysuria and frequency.  Musculoskeletal: Negative.   Negative for arthralgias, back pain, joint swelling and neck pain.  Skin: Negative.  Negative for rash.  Allergic/Immunologic: Negative.   Neurological: Negative.  Negative for tremors and numbness.  Hematological: Negative for adenopathy. Does not bruise/bleed easily.  Psychiatric/Behavioral: Negative.  Negative for behavioral problems, sleep disturbance and suicidal ideas. The patient is not nervous/anxious.     Vital Signs: BP (!) 154/84   Pulse 81   Resp 16   Ht '5\' 11"'  (1.803 m)   Wt 272 lb 6.4 oz (123.6 kg)   SpO2 98%   BMI 37.99 kg/m    Physical Exam Vitals signs and nursing note reviewed.  Constitutional:      General: He is not in acute distress.    Appearance: He is well-developed. He is not diaphoretic.  HENT:     Head: Normocephalic and atraumatic.     Mouth/Throat:     Pharynx: No oropharyngeal exudate.  Eyes:     Pupils: Pupils are equal, round, and reactive to light.  Neck:     Musculoskeletal: Normal range of motion and neck supple.     Thyroid: No thyromegaly.     Vascular: No JVD.     Trachea: No tracheal deviation.  Cardiovascular:     Rate and Rhythm: Normal rate and regular rhythm.     Heart sounds: Normal heart sounds. No murmur. No friction rub. No gallop.   Pulmonary:     Effort: Pulmonary effort is normal. No respiratory distress.     Breath sounds: Normal breath sounds. No wheezing or rales.  Chest:     Chest wall: No tenderness.  Abdominal:     Palpations: Abdomen is soft.     Tenderness: There is no abdominal tenderness. There is no guarding.  Musculoskeletal: Normal range of motion.  Lymphadenopathy:     Cervical: No cervical adenopathy.  Skin:    General: Skin is warm and dry.  Neurological:     Mental Status: He is alert and oriented to person, place, and time.     Cranial Nerves: No cranial nerve deficit.  Psychiatric:        Behavior: Behavior normal.        Thought Content: Thought content normal.        Judgment: Judgment  normal.    Assessment/Plan: 1. Hypertension, unspecified type Slightly elevated today at 154/84, will continue to monitor at future visits.   2. Type 2 diabetes mellitus without complication, with long-term current use of insulin ([place]) Sugars appear controlled at this time.  He denies any issues and takes his medications as prescribed.   3. Erectile dysfunction, unspecified erectile dysfunction type Pt reports issues with  ED.  He has taken Sildenafil in large doses in the past without results.  Offered Urology referral for possible injectable medication or penile pump evaluation, he declined at this time.    4. Nicotine dependence with current use Smoking cessation counseling: 1. Pt acknowledges the risks of long term smoking, she will try to quite smoking. 2. Options for different medications including nicotine products, chewing gum, patch etc, Wellbutrin and Chantix is discussed 3. Goal and date of compete cessation is discussed 4. Total time spent in smoking cessation is 15 min.  5. Class 2 severe obesity due to excess calories with serious comorbidity and body mass index (BMI) of 37.0 to 37.9 in adult [facility]) Obesity Counseling: Risk Assessment: An assessment of behavioral risk factors was made today and includes lack of exercise sedentary lifestyle, lack of portion control and poor dietary habits.  Risk Modification Advice: She was counseled on portion control guidelines. Restricting daily caloric intake to. . The detrimental long term effects of obesity on her health and ongoing poor compliance was also discussed with the patient.    General Counseling: coye dawood understanding of the findings of todays visit and agrees with plan of treatment. I have discussed any further diagnostic evaluation that may be needed or ordered today. We also reviewed his medications today. he has been encouraged to call the office with any questions or concerns that should arise related to todays  visit.  No orders of the defined types were placed in this encounter.   No orders of the defined types were placed in this encounter.   Time spent: 25 Minutes   This patient was seen by Orson Gear AGNP-C in Collaboration with Dr Lavera Guise as a part of collaborative care agreement  Kendell Bane AGNP-C Internal Medicine

## 2018-10-30 NOTE — Patient Instructions (Signed)

## 2018-11-08 ENCOUNTER — Encounter: Payer: Self-pay | Admitting: Adult Health

## 2019-01-31 ENCOUNTER — Ambulatory Visit: Payer: Medicaid Other | Admitting: Adult Health

## 2019-08-19 ENCOUNTER — Other Ambulatory Visit: Payer: Self-pay

## 2019-08-19 ENCOUNTER — Encounter: Payer: Self-pay | Admitting: Emergency Medicine

## 2019-08-19 ENCOUNTER — Inpatient Hospital Stay
Admission: EM | Admit: 2019-08-19 | Discharge: 2019-08-21 | DRG: 638 | Disposition: A | Discharge status: Home / Self Care | Payer: Medicaid Other | Attending: Internal Medicine | Admitting: Internal Medicine

## 2019-08-19 DIAGNOSIS — Z6833 Body mass index (BMI) 33.0-33.9, adult: Secondary | ICD-10-CM | POA: Diagnosis not present

## 2019-08-19 DIAGNOSIS — Z8249 Family history of ischemic heart disease and other diseases of the circulatory system: Secondary | ICD-10-CM

## 2019-08-19 DIAGNOSIS — E11649 Type 2 diabetes mellitus with hypoglycemia without coma: Secondary | ICD-10-CM | POA: Diagnosis present

## 2019-08-19 DIAGNOSIS — Z951 Presence of aortocoronary bypass graft: Secondary | ICD-10-CM

## 2019-08-19 DIAGNOSIS — Z9114 Patient's other noncompliance with medication regimen: Secondary | ICD-10-CM

## 2019-08-19 DIAGNOSIS — Z7984 Long term (current) use of oral hypoglycemic drugs: Secondary | ICD-10-CM

## 2019-08-19 DIAGNOSIS — I10 Essential (primary) hypertension: Secondary | ICD-10-CM | POA: Diagnosis present

## 2019-08-19 DIAGNOSIS — Z20822 Contact with and (suspected) exposure to covid-19: Secondary | ICD-10-CM | POA: Diagnosis present

## 2019-08-19 DIAGNOSIS — N179 Acute kidney failure, unspecified: Secondary | ICD-10-CM | POA: Diagnosis present

## 2019-08-19 DIAGNOSIS — E1100 Type 2 diabetes mellitus with hyperosmolarity without nonketotic hyperglycemic-hyperosmolar coma (NKHHC): Secondary | ICD-10-CM | POA: Diagnosis present

## 2019-08-19 DIAGNOSIS — E669 Obesity, unspecified: Secondary | ICD-10-CM | POA: Diagnosis present

## 2019-08-19 DIAGNOSIS — E871 Hypo-osmolality and hyponatremia: Secondary | ICD-10-CM | POA: Diagnosis not present

## 2019-08-19 DIAGNOSIS — Z79899 Other long term (current) drug therapy: Secondary | ICD-10-CM

## 2019-08-19 DIAGNOSIS — Z9119 Patient's noncompliance with other medical treatment and regimen: Secondary | ICD-10-CM | POA: Diagnosis not present

## 2019-08-19 DIAGNOSIS — Z87891 Personal history of nicotine dependence: Secondary | ICD-10-CM | POA: Diagnosis not present

## 2019-08-19 DIAGNOSIS — Z9111 Patient's noncompliance with dietary regimen: Secondary | ICD-10-CM

## 2019-08-19 DIAGNOSIS — I2510 Atherosclerotic heart disease of native coronary artery without angina pectoris: Secondary | ICD-10-CM | POA: Diagnosis present

## 2019-08-19 DIAGNOSIS — E1110 Type 2 diabetes mellitus with ketoacidosis without coma: Principal | ICD-10-CM

## 2019-08-19 DIAGNOSIS — Z91199 Patient's noncompliance with other medical treatment and regimen due to unspecified reason: Secondary | ICD-10-CM

## 2019-08-19 DIAGNOSIS — E0865 Diabetes mellitus due to underlying condition with hyperglycemia: Secondary | ICD-10-CM | POA: Diagnosis not present

## 2019-08-19 DIAGNOSIS — E860 Dehydration: Secondary | ICD-10-CM | POA: Diagnosis present

## 2019-08-19 LAB — BASIC METABOLIC PANEL
Anion gap: 26 — ABNORMAL HIGH (ref 5–15)
Anion gap: 15 (ref 5–15)
Anion gap: 12 (ref 5–15)
BUN: 38 mg/dL — ABNORMAL HIGH (ref 8–23)
BUN: 30 mg/dL — ABNORMAL HIGH (ref 8–23)
BUN: 26 mg/dL — ABNORMAL HIGH (ref 8–23)
CO2: 12 mmol/L — ABNORMAL LOW (ref 22–32)
CO2: 17 mmol/L — ABNORMAL LOW (ref 22–32)
CO2: 21 mmol/L — ABNORMAL LOW (ref 22–32)
Calcium: 10 mg/dL (ref 8.9–10.3)
Calcium: 9.1 mg/dL (ref 8.9–10.3)
Calcium: 9.2 mg/dL (ref 8.9–10.3)
Chloride: 84 mmol/L — ABNORMAL LOW (ref 98–111)
Chloride: 97 mmol/L — ABNORMAL LOW (ref 98–111)
Chloride: 96 mmol/L — ABNORMAL LOW (ref 98–111)
Creatinine, Ser: 1.45 mg/dL — ABNORMAL HIGH (ref 0.61–1.24)
Creatinine, Ser: 1.07 mg/dL (ref 0.61–1.24)
Creatinine, Ser: 0.95 mg/dL (ref 0.61–1.24)
GFR calc Af Amer: 59 mL/min — ABNORMAL LOW (ref >60)
GFR calc Af Amer: >60 mL/min (ref >60)
GFR calc Af Amer: >60 mL/min (ref >60)
GFR calc non Af Amer: 51 mL/min — ABNORMAL LOW (ref >60)
GFR calc non Af Amer: >60 mL/min (ref >60)
GFR calc non Af Amer: >60 mL/min (ref >60)
Glucose, Bld: 560 mg/dL (ref 70–99)
Glucose, Bld: 197 mg/dL — ABNORMAL HIGH (ref 70–99)
Glucose, Bld: 206 mg/dL — ABNORMAL HIGH (ref 70–99)
Potassium: 5 mmol/L (ref 3.5–5.1)
Potassium: 4.4 mmol/L (ref 3.5–5.1)
Potassium: 4.1 mmol/L (ref 3.5–5.1)
Sodium: 122 mmol/L — ABNORMAL LOW (ref 135–145)
Sodium: 129 mmol/L — ABNORMAL LOW (ref 135–145)
Sodium: 129 mmol/L — ABNORMAL LOW (ref 135–145)

## 2019-08-19 LAB — BLOOD GAS, VENOUS
Acid-base deficit: 14.2 mmol/L — ABNORMAL HIGH (ref 0.0–2.0)
Bicarbonate: 11.6 mmol/L — ABNORMAL LOW (ref 20.0–28.0)
O2 Saturation: 81.8 %
Patient temperature: 37
pCO2, Ven: 27 mmHg — ABNORMAL LOW (ref 44.0–60.0)
pH, Ven: 7.24 — ABNORMAL LOW (ref 7.250–7.430)
pO2, Ven: 55 mmHg — ABNORMAL HIGH (ref 32.0–45.0)

## 2019-08-19 LAB — CBC
HCT: 48.8 % (ref 39.0–52.0)
Hemoglobin: 17.1 g/dL — ABNORMAL HIGH (ref 13.0–17.0)
MCH: 32 pg (ref 26.0–34.0)
MCHC: 35 g/dL (ref 30.0–36.0)
MCV: 91.4 fL (ref 80.0–100.0)
Platelets: 383 10*3/uL (ref 150–400)
RBC: 5.34 MIL/uL (ref 4.22–5.81)
RDW: 11.7 % (ref 11.5–15.5)
WBC: 7.8 10*3/uL (ref 4.0–10.5)
nRBC: 0 % (ref 0.0–0.2)

## 2019-08-19 LAB — GLUCOSE, CAPILLARY
Glucose-Capillary: 175 mg/dL — ABNORMAL HIGH (ref 70–99)
Glucose-Capillary: 193 mg/dL — ABNORMAL HIGH (ref 70–99)
Glucose-Capillary: 203 mg/dL — ABNORMAL HIGH (ref 70–99)
Glucose-Capillary: 205 mg/dL — ABNORMAL HIGH (ref 70–99)
Glucose-Capillary: 207 mg/dL — ABNORMAL HIGH (ref 70–99)
Glucose-Capillary: 211 mg/dL — ABNORMAL HIGH (ref 70–99)
Glucose-Capillary: 261 mg/dL — ABNORMAL HIGH (ref 70–99)
Glucose-Capillary: 293 mg/dL — ABNORMAL HIGH (ref 70–99)
Glucose-Capillary: 350 mg/dL — ABNORMAL HIGH (ref 70–99)
Glucose-Capillary: 427 mg/dL — ABNORMAL HIGH (ref 70–99)
Glucose-Capillary: 462 mg/dL — ABNORMAL HIGH (ref 70–99)
Glucose-Capillary: 466 mg/dL — ABNORMAL HIGH (ref 70–99)
Glucose-Capillary: 535 mg/dL (ref 70–99)

## 2019-08-19 LAB — BETA-HYDROXYBUTYRIC ACID: Beta-Hydroxybutyric Acid: >8 mmol/L — ABNORMAL HIGH (ref 0.05–0.27)

## 2019-08-19 LAB — RESPIRATORY PANEL BY RT PCR (FLU A&B, COVID)
Influenza A by PCR: NEGATIVE
Influenza B by PCR: NEGATIVE
SARS Coronavirus 2 by RT PCR: NEGATIVE

## 2019-08-19 LAB — URINALYSIS, COMPLETE (UACMP) WITH MICROSCOPIC
Bilirubin Urine: NEGATIVE
Glucose, UA: >=500 mg/dL — AB
Hgb urine dipstick: NEGATIVE
Ketones, ur: 80 mg/dL — AB
Leukocytes,Ua: NEGATIVE
Nitrite: NEGATIVE
Protein, ur: NEGATIVE mg/dL
Specific Gravity, Urine: 1.022 (ref 1.005–1.030)
pH: 5 (ref 5.0–8.0)

## 2019-08-19 LAB — MRSA PCR SCREENING: MRSA by PCR: NEGATIVE

## 2019-08-19 LAB — TROPONIN I (HIGH SENSITIVITY)
Troponin I (High Sensitivity): 8 ng/L (ref <18)
Troponin I (High Sensitivity): 20 ng/L — ABNORMAL HIGH (ref <18)

## 2019-08-19 MED ORDER — POTASSIUM CHLORIDE 10 MEQ/100ML IV SOLN
10.0000 meq | INTRAVENOUS | Status: DC
  Filled 2019-08-19 (×2): qty 100

## 2019-08-19 MED ORDER — LABETALOL HCL 5 MG/ML IV SOLN: 10.0000 mg | Freq: Four times a day (QID) | INTRAVENOUS | Status: DC | PRN

## 2019-08-19 MED ORDER — INSULIN ASPART 100 UNIT/ML ~~LOC~~ SOLN
3.0000 [IU] | Freq: Three times a day (TID) | SUBCUTANEOUS | Status: DC
  Administered 2019-08-20 (×2): 3 [IU] via SUBCUTANEOUS
  Filled 2019-08-19 (×2): qty 1

## 2019-08-19 MED ORDER — INSULIN ASPART 100 UNIT/ML ~~LOC~~ SOLN
0.0000 [IU] | Freq: Every day | SUBCUTANEOUS | Status: DC
  Administered 2019-08-20: 2 [IU] via SUBCUTANEOUS
  Administered 2019-08-20: 4 [IU] via SUBCUTANEOUS
  Filled 2019-08-19 (×2): qty 1

## 2019-08-19 MED ORDER — POTASSIUM CHLORIDE 10 MEQ/100ML IV SOLN
10.0000 meq | INTRAVENOUS | Status: AC
  Administered 2019-08-19 (×2): 10 meq via INTRAVENOUS
  Filled 2019-08-19 (×2): qty 100

## 2019-08-19 MED ORDER — DEXTROSE-NACL 5-0.45 % IV SOLN: INTRAVENOUS | Status: DC

## 2019-08-19 MED ORDER — INSULIN ASPART 100 UNIT/ML ~~LOC~~ SOLN
0.0000 [IU] | Freq: Three times a day (TID) | SUBCUTANEOUS | Status: DC
  Administered 2019-08-20: 7 [IU] via SUBCUTANEOUS
  Administered 2019-08-20: 3 [IU] via SUBCUTANEOUS
  Administered 2019-08-20 – 2019-08-21 (×2): 5 [IU] via SUBCUTANEOUS
  Administered 2019-08-21: 7 [IU] via SUBCUTANEOUS
  Filled 2019-08-19 (×4): qty 1

## 2019-08-19 MED ORDER — SODIUM CHLORIDE 0.9 % IV SOLN: INTRAVENOUS | Status: DC

## 2019-08-19 MED ORDER — DEXTROSE 50 % IV SOLN: 0.0000 mL | INTRAVENOUS | Status: DC | PRN

## 2019-08-19 MED ORDER — OXYCODONE HCL 5 MG PO TABS
5.0000 mg | ORAL_TABLET | Freq: Four times a day (QID) | ORAL | Status: DC | PRN
  Administered 2019-08-19 – 2019-08-20 (×3): 5 mg via ORAL
  Filled 2019-08-19 (×3): qty 1

## 2019-08-19 MED ORDER — INSULIN GLARGINE 100 UNIT/ML ~~LOC~~ SOLN
10.0000 [IU] | SUBCUTANEOUS | Status: DC
  Administered 2019-08-19: 10 [IU] via SUBCUTANEOUS
  Filled 2019-08-19 (×2): qty 0.1

## 2019-08-19 MED ORDER — INSULIN REGULAR(HUMAN) IN NACL 100-0.9 UT/100ML-% IV SOLN: INTRAVENOUS | Status: DC

## 2019-08-19 MED ORDER — INSULIN REGULAR(HUMAN) IN NACL 100-0.9 UT/100ML-% IV SOLN
INTRAVENOUS | Status: DC
  Administered 2019-08-19: 4.8 [IU]/h via INTRAVENOUS
  Administered 2019-08-19: 13 [IU]/h via INTRAVENOUS
  Filled 2019-08-19 (×2): qty 100

## 2019-08-19 MED ORDER — SODIUM CHLORIDE 0.9 % IV BOLUS
1000.0000 mL | Freq: Once | INTRAVENOUS | Status: AC
  Administered 2019-08-19: 12:00:00 1000 mL via INTRAVENOUS

## 2019-08-19 MED ORDER — LACTATED RINGERS IV BOLUS
1000.0000 mL | Freq: Once | INTRAVENOUS | Status: AC
  Administered 2019-08-19: 1000 mL via INTRAVENOUS

## 2019-08-19 MED ORDER — HYDROXYZINE HCL 25 MG PO TABS
25.0000 mg | ORAL_TABLET | Freq: Three times a day (TID) | ORAL | Status: DC | PRN
  Filled 2019-08-19: qty 1

## 2019-08-19 NOTE — ED Notes (Signed)
RN attempted to call report to ICU RN however receiving RN was providing care for another patient. RN will call again at 1820 to give report.

## 2019-08-19 NOTE — ED Notes (Signed)
Report has been given to ICU RN. Float RN will transport patient to ICU.

## 2019-08-19 NOTE — ED Triage Notes (Signed)
Pt arrived via POV with reports of running out of diabetes medications x 4 days, states he is out of refills and does not have a PCP.  Pt only takes PO meds for diabetes.  Pt states last night his meter read HIGH.  Pt c/o increased thirst, pt states he is also constipated, states he stuck his finger up his rectum to try to break some loose.  Pt has bag of meds with him.

## 2019-08-19 NOTE — Progress Notes (Signed)
1845 received from ED via stretcher. Report from [facility] in ED.

## 2019-08-19 NOTE — ED Notes (Signed)
RN called pharmacy to verify Potassium runs due to 2-3 orders being put in and d/c'ed. Pharmacy confirmed patient is to only receive 2x runs of Potassium.

## 2019-08-19 NOTE — ED Provider Notes (Signed)
[facility] Emergency Department Provider Note  ____________________________________________   First MD Initiated Contact with Patient 08/19/19 1051     (approximate)  I have reviewed the triage vital signs and the nursing notes.   HISTORY  Chief Complaint Medication Refill and Hyperglycemia    HPI Jeremy [name] is a 64 y.o. male with diabetes, coronary disease, hypertension who comes in with concerns for medication refill.  Patient has not been able to get his medications refilled.  He thought he had a appointment when he went there he did not.  Patient stated that he had a sugar in the 500s.  Patient's ran out of his Januvia and his glimepiride and his losartan.  It appears that patient still has his Metformin that he has been taking.  He endorses a little bit of nausea but no vomiting.  This started today, constant, nothing makes it better, nothing makes it worse.  A little bit of chest discomfort earlier today but none currently.  No abdominal pain.    Past Medical History:  Diagnosis Date  . Coronary artery disease   . Diabetes mellitus without complication ([place])   . Hypertension   . S/P CABG x 1     Patient Active Problem List   Diagnosis Date Noted  . Post-traumatic osteoarthritis of right ankle 10/23/2018  . Skin ulcer of right ankle with fat layer exposed ([place]) 10/23/2018  . Status post ORIF of fracture of ankle 10/23/2018  . Diabetic foot infection ([place]) 10/22/2018  . Pressure injury of skin 02/11/2018  . DKA (diabetic ketoacidoses) ([place]) 02/10/2018    Past Surgical History:  Procedure Laterality Date  . ANKLE ARTHROSCOPY W/ OPEN REPAIR Right   . CORONARY ARTERY BYPASS GRAFT      Prior to Admission medications   Medication Sig Start Date End Date Taking? Authorizing Provider  blood glucose meter kit and supplies KIT Dispense based on patient and insurance preference. Use up to four times daily as directed. (FOR ICD-9 250.00,  250.01). 02/13/18   Gladstone Lighter, MD  glimepiride (AMARYL) 2 MG tablet Take 1 tablet (2 mg total) by mouth daily with breakfast. 10/08/18   Caryn Section Linden Dolin, PA-C  losartan (COZAAR) 50 MG tablet Take 1 tablet (50 mg total) by mouth daily. 10/08/18 01/06/19  Versie Starks, PA-C  metFORMIN (GLUCOPHAGE) 1000 MG tablet Take 1 tablet (1,000 mg total) by mouth 2 (two) times daily with a meal. 10/08/18   Fisher, Linden Dolin, PA-C  silver sulfADIAZINE (SILVADENE) 1 % cream Apply to affected area daily Patient not taking: Reported on 10/30/2018 10/24/18 10/24/19  Bettey Costa, MD  sitaGLIPtin (JANUVIA) 100 MG tablet Take 1 tablet (100 mg total) by mouth daily. 10/08/18   Versie Starks, PA-C    Allergies Amoxicillin and Keflex [cephalexin]  Family History  Problem Relation Age of Onset  . Heart attack Father     Social History Social History   Tobacco Use  . Smoking status: Current Some Day Smoker    Packs/day: 0.50    Types: Cigarettes  . Smokeless tobacco: Former Network engineer Use Topics  . Alcohol use: Not Currently  . Drug use: Yes    Types: Marijuana      Review of Systems Constitutional: No fever/chills Eyes: No visual changes. ENT: No sore throat. Cardiovascular: Mild chest pain that is now resolved Respiratory: Denies shortness of breath. Gastrointestinal: No abdominal pain.  Positive nausea, no vomiting.  No diarrhea.  No constipation. Genitourinary: Negative for  dysuria. Musculoskeletal: Negative for back pain. Skin: Negative for rash. Neurological: Negative for headaches, focal weakness or numbness. Concern for elevated sugars All other ROS negative ____________________________________________   PHYSICAL EXAM:  VITAL SIGNS: ED Triage Vitals  Enc Vitals Group     BP 08/19/19 1029 (!) 159/93     Pulse Rate 08/19/19 1029 94     Resp 08/19/19 1029 18     Temp --      Temp src --      SpO2 08/19/19 1029 100 %     Weight 08/19/19 1033 240 lb (108.9 kg)     Height  08/19/19 1033 '5\' 11"'  (1.803 m)     Head Circumference --      Peak Flow --      Pain Score 08/19/19 1033 0     Pain Loc --      Pain Edu? --      Excl. in [place]? --     Constitutional: Alert and oriented. Well appearing and in no acute distress. Eyes: Conjunctivae are normal. EOMI. Head: Atraumatic. Nose: No congestion/rhinnorhea. Mouth/Throat: Mucous membranes are dry Neck: No stridor. Trachea Midline. FROM Cardiovascular: Normal rate, regular rhythm. Grossly normal heart sounds.  Good peripheral circulation. Respiratory: Normal respiratory effort.  No retractions. Lungs CTAB. Gastrointestinal: Soft and nontender. No distention. No abdominal bruits.  Musculoskeletal: No lower extremity tenderness nor edema.  No joint effusions. Neurologic:  Normal speech and language. No gross focal neurologic deficits are appreciated.  Skin:  Skin is warm, dry and intact. No rash noted. Psychiatric: Mood and affect are normal. Speech and behavior are normal. GU: Deferred   ____________________________________________   LABS (all labs ordered are listed, but only abnormal results are displayed)  Labs Reviewed  GLUCOSE, CAPILLARY - Abnormal; Notable for the following components:      Result Value   Glucose-Capillary 535 (*)    All other components within normal limits  CBC - Abnormal; Notable for the following components:   Hemoglobin 17.1 (*)    All other components within normal limits  BASIC METABOLIC PANEL  URINALYSIS, COMPLETE (UACMP) WITH MICROSCOPIC  BLOOD GAS, VENOUS  BETA-HYDROXYBUTYRIC ACID  TROPONIN I (HIGH SENSITIVITY)   ____________________________________________   ED ECG REPORT I, Vanessa [place], the attending physician, personally viewed and interpreted this ECG.  EKG is sinus rate of 88, no ST elevation, no T wave inversions, normal intervals ____________________________________________   PROCEDURES  Procedure(s) performed (including Critical Care):  .Critical  Care Performed by: Vanessa [place], MD Authorized by: Vanessa [place], MD   Critical care provider statement:    Critical care time (minutes):  45   Critical care was necessary to treat or prevent imminent or life-threatening deterioration of the following conditions:  Endocrine crisis   Critical care was time spent personally by me on the following activities:  Discussions with consultants, evaluation of patient's response to treatment, examination of patient, ordering and performing treatments and interventions, ordering and review of laboratory studies, ordering and review of radiographic studies, pulse oximetry, re-evaluation of patient's condition, obtaining history from patient or surrogate and review of old charts     ____________________________________________   INITIAL IMPRESSION / ASSESSMENT AND PLAN / ED COURSE  Jeremy [name] was evaluated in Emergency Department on 08/19/2019 for the symptoms described in the history of present illness. He was evaluated in the context of the global COVID-19 pandemic, which necessitated consideration that the patient might be at risk for infection with the SARS-CoV-2 virus  that causes COVID-19. Institutional protocols and algorithms that pertain to the evaluation of patients at risk for COVID-19 are in a state of rapid change based on information released by regulatory bodies including the CDC and federal and state organizations. These policies and algorithms were followed during the patient's care in the ED.    Patient is a 64 year old who is been off of his medications for a few days secondary to running out and unable to get a primary doctor appointment who comes in with concerns for high sugars.  Will get labs to evaluate for DKA, electrolyte abnormalities, AKI.  Will give some fluids and plan to restart patient's medications.  Patient stated he had a little bit of chest discomfort earlier today but none currently.  Will get cardiac markers to make  sure no evidence of ACS although I have low suspicion.  Patient with AKI.  Patient getting 2 L of fluid  EKG without evidence of ACS.  Patient's labs are concerning for DKA.  Will start patient on insulin and discussed with hospital team for admission  Discussed with patient he is okay with this [place]        ____________________________________________   FINAL CLINICAL IMPRESSION(S) / ED DIAGNOSES   Final diagnoses:  Diabetic ketoacidosis without coma associated with type 2 diabetes mellitus ([place])  AKI (acute kidney injury) ([place])      MEDICATIONS GIVEN DURING THIS VISIT:  Medications  insulin regular, human (MYXREDLIN) 100 units/ 100 mL infusion (has no administration in time range)  0.9 %  sodium chloride infusion (has no administration in time range)  dextrose 5 %-0.45 % sodium chloride infusion ( Intravenous Hold 08/19/19 1225)  dextrose 50 % solution 0-50 mL (has no administration in time range)  potassium chloride 10 mEq in 100 mL IVPB (has no administration in time range)  lactated ringers bolus 1,000 mL (has no administration in time range)  sodium chloride 0.9 % bolus 1,000 mL (1,000 mLs Intravenous New Bag/Given 08/19/19 1206)     ED Discharge Orders    None       Note:  This document was prepared using Dragon voice recognition software and may include unintentional dictation errors.   Vanessa [name], MD 08/19/19 1239

## 2019-08-19 NOTE — H&P (Signed)
History and Physical    Jeremy [name] HAF:[id number] DOB: [birth year] DOA: 08/19/2019  PCP: Ronnell Freshwater, NP   Patient coming from: Home  I have personally briefly reviewed patient's old medical records in [place]  Chief Complaint: Elevated blood sugars  HPI: Jeremy [name] is a 64 y.o. male with medical history significant for diabetes mellitus, coronary artery disease and hypertension who presented to the emergency room for evaluation of hyperglycemia and requesting refill of his medications.  Patient stated that his blood sugar has been in the 500s for about 3 days.  He ran out of his Januvia, glimepiride and losartan but still has Metformin.  He complains of nausea and vomiting but denies having abdominal pain. Complained of mild chest discomfort a couple of days prior to his admission but it has resolved.  Denies having any diaphoresis, shortness of breath, dizziness, lightheadedness, no fever or chills.  His blood sugar was 560 and anion gap of 26.  He will be admitted to the hospital for further evaluation  ED Course: Patient was seen in the emergency room for hypoglycemia and he requested refills of his medications.  Labs revealed hyperglycemia and anion gap metabolic acidosis and acute kidney injury.  Patient received IV fluid and was started on an insulin drip  Review of Systems: As per HPI otherwise 10 point review of systems negative.    Past Medical History:  Diagnosis Date  . Coronary artery disease   . Diabetes mellitus without complication ([place])   . Hypertension   . S/P CABG x 1     Past Surgical History:  Procedure Laterality Date  . ANKLE ARTHROSCOPY W/ OPEN REPAIR Right   . CORONARY ARTERY BYPASS GRAFT       reports that he has been smoking cigarettes. He has been smoking about 0.50 packs per day. He has quit using smokeless tobacco. He reports previous alcohol use. He reports current drug use. Drug: Marijuana.  Allergies  Allergen Reactions  .  Amoxicillin Swelling  . Keflex [Cephalexin] Rash    Family History  Problem Relation Age of Onset  . Heart attack Father      Prior to Admission medications   Medication Sig Start Date End Date Taking? Authorizing Provider  blood glucose meter kit and supplies KIT Dispense based on patient and insurance preference. Use up to four times daily as directed. (FOR ICD-9 250.00, 250.01). 02/13/18   Gladstone Lighter, MD  glimepiride (AMARYL) 2 MG tablet Take 1 tablet (2 mg total) by mouth daily with breakfast. 10/08/18   Caryn Section Linden Dolin, PA-C  losartan (COZAAR) 50 MG tablet Take 1 tablet (50 mg total) by mouth daily. 10/08/18 01/06/19  Versie Starks, PA-C  metFORMIN (GLUCOPHAGE) 1000 MG tablet Take 1 tablet (1,000 mg total) by mouth 2 (two) times daily with a meal. 10/08/18   Fisher, Linden Dolin, PA-C  silver sulfADIAZINE (SILVADENE) 1 % cream Apply to affected area daily Patient not taking: Reported on 10/30/2018 10/24/18 10/24/19  Bettey Costa, MD  sitaGLIPtin (JANUVIA) 100 MG tablet Take 1 tablet (100 mg total) by mouth daily. 10/08/18   Versie Starks, PA-C    Physical Exam: Vitals:   08/19/19 1029 08/19/19 1033 08/19/19 1200 08/19/19 1249  BP: (!) 159/93  (!) 149/92 (!) 158/98  Pulse: 94  87 85  Resp: 18  (!) 22 19  SpO2: 100%  98% 99%  Weight:  108.9 kg    Height:  _0  (1.803 m)  Vitals:   08/19/19 1029 08/19/19 1033 08/19/19 1200 08/19/19 1249  BP: (!) 159/93  (!) 149/92 (!) 158/98  Pulse: 94  87 85  Resp: 18  (!) 22 19  SpO2: 100%  98% 99%  Weight:  108.9 kg    Height:  _0  (1.803 m)      Constitutional: NAD, alert and oriented to person place and time, unkempt Eyes: PERRL, lids and conjunctivae normal ENMT: Mucous membranes are dry Neck: normal, supple, no masses, no thyromegaly Respiratory: clear to auscultation bilaterally, no wheezing, no crackles. Normal respiratory effort. No accessory muscle use.  Cardiovascular: Regular rate and rhythm, no murmurs / rubs /  gallops. No extremity edema. 2+ pedal pulses. No carotid bruits.  Abdomen: no tenderness, no masses palpated. No hepatosplenomegaly. Bowel sounds positive.  Musculoskeletal: no clubbing / cyanosis. No joint deformity upper and lower extremities.  Skin: no rashes, lesions, ulcers.  Neurologic: No gross focal neurologic deficit. Psychiatric: Normal mood and affect.   Labs on Admission: I have personally reviewed following labs and imaging studies  CBC: Recent Labs  Lab 08/19/19 1118  WBC 7.8  HGB 17.1*  HCT 48.8  MCV 91.4  PLT 347   Basic Metabolic Panel: Recent Labs  Lab 08/19/19 1118  NA 122*  K 5.0  CL 84*  CO2 12*  GLUCOSE 560*  BUN 38*  CREATININE 1.45*  CALCIUM 10.0   GFR: Estimated Creatinine Clearance: 66.3 mL/min (A) (by C-G formula based on SCr of 1.45 mg/dL (H)). Liver Function Tests: No results for input(s): AST, ALT, ALKPHOS, BILITOT, PROT, ALBUMIN in the last 168 hours. No results for input(s): LIPASE, AMYLASE in the last 168 hours. No results for input(s): AMMONIA in the last 168 hours. Coagulation Profile: No results for input(s): INR, PROTIME in the last 168 hours. Cardiac Enzymes: No results for input(s): CKTOTAL, CKMB, CKMBINDEX, TROPONINI in the last 168 hours. BNP (last 3 results) No results for input(s): PROBNP in the last 8760 hours. HbA1C: No results for input(s): HGBA1C in the last 72 hours. CBG: Recent Labs  Lab 08/19/19 1029 08/19/19 1252  GLUCAP 535* 466*   Lipid Profile: No results for input(s): CHOL, HDL, LDLCALC, TRIG, CHOLHDL, LDLDIRECT in the last 72 hours. Thyroid Function Tests: No results for input(s): TSH, T4TOTAL, FREET4, T3FREE, THYROIDAB in the last 72 hours. Anemia Panel: No results for input(s): VITAMINB12, FOLATE, FERRITIN, TIBC, IRON, RETICCTPCT in the last 72 hours. Urine analysis:    Component Value Date/Time   COLORURINE STRAW (A) 08/19/2019 1118   APPEARANCEUR CLEAR (A) 08/19/2019 1118   LABSPEC 1.022  08/19/2019 1118   PHURINE 5.0 08/19/2019 1118   GLUCOSEU >=500 (A) 08/19/2019 1118   HGBUR NEGATIVE 08/19/2019 1118   BILIRUBINUR NEGATIVE 08/19/2019 1118   KETONESUR 80 (A) 08/19/2019 1118   PROTEINUR NEGATIVE 08/19/2019 1118   NITRITE NEGATIVE 08/19/2019 1118   LEUKOCYTESUR NEGATIVE 08/19/2019 1118    Radiological Exams on Admission: No results found.  EKG: Independently reviewed.  Sinus rhythm  Assessment/Plan Active Problems:   DKA (diabetic ketoacidoses) (HCC)   Hyponatremia   AKI (acute kidney injury) ([place])   Essential hypertension   Medical non-compliance    Diabetic ketoacidosis Due to medication and dietary noncompliance Patient with significant hyperglycemia and an anion gap metabolic acidosis of 26 on admissiom Start patient on an insulin drip per protocol Aggressive IV fluid hydration Obtain hemoglobin A1c levels Diabetic education   Acute kidney injury Patient has a baseline serum creatinine of 0.79 and today on  admission it was 1.45 Acute kidney injury is secondary to osmotic diuresis from hyperglycemia Hold Losartan Hydrate patient and repeat renal parameters in a.m.   Hyponatremia Secondary to hyperglycemia Expect improvement in patient's serum sodium levels following solution of hypoglycemia   Hypertension PRN Labetalol for SBP > 1106mHg Will resume Losartan once blood pressure improves   Medication Non compliance Will need case management consult   DVT prophylaxis: Lovenox Code Status: Full code Family Communication: Plan of care was discussed with patient, he verbalizes understanding and agrees with the plan Disposition Plan: Back to previous home environment Consults called: None    Kwynn Schlotter MD [facility]     08/19/2019, 1:19 PM

## 2019-08-20 DIAGNOSIS — E0865 Diabetes mellitus due to underlying condition with hyperglycemia: Secondary | ICD-10-CM

## 2019-08-20 DIAGNOSIS — Z9119 Patient's noncompliance with other medical treatment and regimen: Secondary | ICD-10-CM

## 2019-08-20 DIAGNOSIS — I10 Essential (primary) hypertension: Secondary | ICD-10-CM

## 2019-08-20 LAB — GLUCOSE, CAPILLARY
Glucose-Capillary: 200 mg/dL — ABNORMAL HIGH (ref 70–99)
Glucose-Capillary: 222 mg/dL — ABNORMAL HIGH (ref 70–99)
Glucose-Capillary: 216 mg/dL — ABNORMAL HIGH (ref 70–99)
Glucose-Capillary: 246 mg/dL — ABNORMAL HIGH (ref 70–99)
Glucose-Capillary: 406 mg/dL — ABNORMAL HIGH (ref 70–99)
Glucose-Capillary: 312 mg/dL — ABNORMAL HIGH (ref 70–99)
Glucose-Capillary: 251 mg/dL — ABNORMAL HIGH (ref 70–99)
Glucose-Capillary: 328 mg/dL — ABNORMAL HIGH (ref 70–99)

## 2019-08-20 LAB — BASIC METABOLIC PANEL
Anion gap: 11 (ref 5–15)
Anion gap: 8 (ref 5–15)
BUN: 21 mg/dL (ref 8–23)
BUN: 18 mg/dL (ref 8–23)
CO2: 16 mmol/L — ABNORMAL LOW (ref 22–32)
CO2: 19 mmol/L — ABNORMAL LOW (ref 22–32)
Calcium: 8.3 mg/dL — ABNORMAL LOW (ref 8.9–10.3)
Calcium: 8.4 mg/dL — ABNORMAL LOW (ref 8.9–10.3)
Chloride: 102 mmol/L (ref 98–111)
Chloride: 101 mmol/L (ref 98–111)
Creatinine, Ser: 0.78 mg/dL (ref 0.61–1.24)
Creatinine, Ser: 0.91 mg/dL (ref 0.61–1.24)
GFR calc Af Amer: >60 mL/min (ref >60)
GFR calc Af Amer: >60 mL/min (ref >60)
GFR calc non Af Amer: >60 mL/min (ref >60)
GFR calc non Af Amer: >60 mL/min (ref >60)
Glucose, Bld: 248 mg/dL — ABNORMAL HIGH (ref 70–99)
Glucose, Bld: 340 mg/dL — ABNORMAL HIGH (ref 70–99)
Potassium: 4 mmol/L (ref 3.5–5.1)
Potassium: 3.9 mmol/L (ref 3.5–5.1)
Sodium: 129 mmol/L — ABNORMAL LOW (ref 135–145)
Sodium: 128 mmol/L — ABNORMAL LOW (ref 135–145)

## 2019-08-20 LAB — HEMOGLOBIN A1C
Hgb A1c MFr Bld: 13.5 % — ABNORMAL HIGH (ref 4.8–5.6)
Mean Plasma Glucose: 340.75 mg/dL

## 2019-08-20 LAB — MAGNESIUM: Magnesium: 2.3 mg/dL (ref 1.7–2.4)

## 2019-08-20 MED ORDER — LOSARTAN POTASSIUM 50 MG PO TABS
50.0000 mg | ORAL_TABLET | Freq: Every day | ORAL | Status: DC
  Administered 2019-08-20 – 2019-08-21 (×2): 50 mg via ORAL
  Filled 2019-08-20 (×2): qty 1

## 2019-08-20 MED ORDER — IBUPROFEN 400 MG PO TABS
600.0000 mg | ORAL_TABLET | Freq: Three times a day (TID) | ORAL | Status: DC | PRN
  Administered 2019-08-20 – 2019-08-21 (×2): 600 mg via ORAL
  Filled 2019-08-20 (×2): qty 2

## 2019-08-20 MED ORDER — CHLORHEXIDINE GLUCONATE CLOTH 2 % EX PADS
6.0000 | MEDICATED_PAD | Freq: Every day | CUTANEOUS | Status: DC
  Administered 2019-08-20: 6 via TOPICAL

## 2019-08-20 MED ORDER — INSULIN ASPART 100 UNIT/ML ~~LOC~~ SOLN
7.0000 [IU] | Freq: Three times a day (TID) | SUBCUTANEOUS | Status: DC
  Administered 2019-08-20: 7 [IU] via SUBCUTANEOUS
  Filled 2019-08-20: qty 1

## 2019-08-20 MED ORDER — ENOXAPARIN SODIUM 40 MG/0.4ML ~~LOC~~ SOLN
40.0000 mg | Freq: Every day | SUBCUTANEOUS | Status: DC
  Administered 2019-08-20 – 2019-08-21 (×2): 40 mg via SUBCUTANEOUS
  Filled 2019-08-20 (×2): qty 0.4

## 2019-08-20 MED ORDER — INSULIN GLARGINE 100 UNIT/ML ~~LOC~~ SOLN
12.0000 [IU] | SUBCUTANEOUS | Status: DC
  Administered 2019-08-20: 12 [IU] via SUBCUTANEOUS
  Filled 2019-08-20 (×2): qty 0.12

## 2019-08-20 NOTE — TOC Initial Note (Signed)
Transition of Care [facility]) - Initial/Assessment Note    Patient Details  Name: Jeremy [name] MRN: [id number] Date of Birth: 1955-07-09  [facility] CM/SW Contact:    Candie Chroman, LCSW Phone Number: 08/20/2019, 4:00 PM  Clinical Narrative:  CSW met with patient. No supports at bedside. CSW introduced role and inquired about patient wanting to change his PCP. Patient confirmed. No provider preference. CSW called [facility]. They do have a provider that is accepting Medicaid patients but the next "new patient appt" is not until April 14. The receptionist will check with that provider to see if they can get him in for a hospital follow up appt sooner than that. CSW called DSS to see what patient will need to do to switch providers. Was told that once patient has an established provider, he just needs to call and update his Medicaid worker.         Expected Discharge Plan: Home/Self Care Barriers to Discharge: Continued Medical Work up   Patient Goals and CMS Choice        Expected Discharge Plan and Services Expected Discharge Plan: Home/Self Care       Living arrangements for the past 2 months: Single Family Home                                      Prior Living Arrangements/Services Living arrangements for the past 2 months: Single Family Home Lives with:: Spouse Patient language and need for interpreter reviewed:: Yes        Need for Family Participation in Patient Care: Yes (Comment) Care giver support system in place?: Yes (comment)   Criminal Activity/Legal Involvement Pertinent to Current Situation/Hospitalization: No - Comment as needed  Activities of Daily Living Home Assistive Devices/Equipment: None ADL Screening (condition at time of admission) Patient's cognitive ability adequate to safely complete daily activities?: Yes Is the patient deaf or have difficulty hearing?: No Does the patient have difficulty seeing, even when  wearing glasses/contacts?: No Does the patient have difficulty concentrating, remembering, or making decisions?: No Patient able to express need for assistance with ADLs?: Yes Does the patient have difficulty dressing or bathing?: No Independently performs ADLs?: Yes (appropriate for developmental age) Does the patient have difficulty walking or climbing stairs?: No Weakness of Legs: None Weakness of Arms/Hands: None  Permission Sought/Granted Permission sought to share information with : Facility [employer] administrator granted to share info w AGENCY: PCP Offices        Emotional Assessment Appearance:: Appears stated age Attitude/Demeanor/Rapport: Engaged, Gracious Affect (typically observed): Accepting, Appropriate, Calm, Pleasant Orientation: : Oriented to Self, Oriented to Place, Oriented to  Time, Oriented to Situation Alcohol / Substance Use: Not Applicable Psych Involvement: No (comment)  Admission diagnosis:  DKA (diabetic ketoacidoses) (HCC) [E11.10] AKI (acute kidney injury) ([place]) [N17.9] Diabetic ketoacidosis without coma associated with type 2 diabetes mellitus ([place]) [E11.10] Patient Active Problem List   Diagnosis Date Noted  . Hyponatremia 08/19/2019  . AKI (acute kidney injury) ([place]) 08/19/2019  . Essential hypertension 08/19/2019  . Medical non-compliance 08/19/2019  . Post-traumatic osteoarthritis of right ankle 10/23/2018  . Skin ulcer of right ankle with fat layer exposed ([place]) 10/23/2018  . Status post ORIF of fracture of ankle 10/23/2018  . Diabetic foot infection ([place]) 10/22/2018  . Pressure injury of skin 02/11/2018  . DKA (diabetic ketoacidoses) ([place])  02/10/2018   PCP:  Ronnell Freshwater, NP Pharmacy:   [facility] [address]), [place] [address] Phone: [phone] Fax: [place], [place] [address] [place] [address] Phone: [phone] Fax: [phone]     Social Determinants of Health (SDOH) Interventions    Readmission Risk Interventions Readmission Risk Prevention Plan 02/13/2018  PCP or Specialist Appt within 5-7 Days Complete  Some recent data might be hidden

## 2019-08-20 NOTE — Progress Notes (Addendum)
Inpatient Diabetes Program Recommendations  AACE/ADA: New Consensus Statement on Inpatient Glycemic Control (2015)  Target Ranges:  Prepandial:   less than 140 mg/dL      Peak postprandial:   less than 180 mg/dL (1-2 hours)      Critically ill patients:  140 - 180 mg/dL   Results for Jeremy [name], Jeremy [name] (MRN [id number]) as of 08/20/2019 07:42  Ref. Range 08/19/2019 10:29 08/19/2019 12:52 08/19/2019 13:58 08/19/2019 14:28 08/19/2019 15:01 08/19/2019 16:07 08/19/2019 17:08 08/19/2019 18:35 08/19/2019 19:28 08/19/2019 20:47 08/19/2019 22:00 08/19/2019 22:57 08/19/2019 23:50 08/20/2019 01:21 08/20/2019 04:01 08/20/2019 07:19  Glucose-Capillary Latest Ref Range: 70 - 99 mg/dL 535 (HH) 466 (H)  IV Insulin Drip started 462 (H)  IV Insulin Drip 427 (H)  IV Insulin Drip 350 (H)  IV Insulin Drip 261 (H)  IV Insulin Drip 203 (H)  IV Insulin Drip 211 (H)  IV Insulin Drip 205 (H) 207 (H) 193 (H)  IV Insulin Drip 175 (H)  10 units LANTUS given 293 (H) 222 (H)  IV Insulin Drip Stopped +  2 units NOVOLOG  216 (H) 246 (H)   Results for Jeremy [name], Jeremy [name] (MRN [id number]) as of 08/20/2019 07:42  Ref. Range 08/19/2019 11:18  Glucose Latest Ref Range: 70 - 99 mg/dL 560 (HH)    Admit with: DKA--Requesting refills of his meds  History: DM  Home DM Meds: Amaryl 2 mg Daily       Metformin 1000 mg BID       Januvia 100 mg Daily  Current Orders:  Lantus 10 units Daily      Novolog Sensitive Correction Scale/ SSI (0-9 units) TID AC + HS      Novolog 3 units TID with meals     Patient stated that his blood sugar has been in the 500s for about 3 days.  He ran out of his Januvia, glimepiride and losartan but still has Metformin.  PCP listed as Leretha Pol, NP ([place])    MD- Note patient transitioned to SQ Insulin this AM.  Lantus given at 11pm last night and Novolog to start this AM.  Please consider adding Hemoglobin A1c on to labs today--Last one on file was 7.6% back in May of 2020.  Please consider  increasing the Novolog Meal Coverage to 6 units TID with meals    Addendum 12:40pm--Met w/ pt today to discuss elevated CBGs on admission and lack of meds at home for over 2 weeks.  I asked pt why he did not contact his PCP for refills.  Pt told me he won't go back to the MDs at the [facility] center.  Just wants the hospital to give him refills.  Has Medicaid and can get meds for low cost.  Have placed TOC consult to see if we can help pt find new PCP.  Reminded patient that his goal A1c is 7% or less per ADA standards to prevent both acute and long-term complications.  Explained to patient the extreme importance of good glucose control at home.  Encouraged patient to check his CBGs at least bid at home (fasting and another check within the day) and to record all CBGs in a logbook for his PCP to review.  Pt has CBG meter at home.           --Will follow patient during hospitalization--  Wyn Quaker RN, MSN, CDE Diabetes Coordinator Inpatient Glycemic Control Team Team Pager: [phone] (8a-5p)

## 2019-08-20 NOTE — Progress Notes (Signed)
PROGRESS NOTE                                                                                                                                                                                                             Patient Demographics:    Jeremy [name], is a 64 y.o. male, DOB - 06/22/1955, GHW:[id number]  Admit date - 08/19/2019   Admitting Physician Collier Bullock, MD  Outpatient Primary MD for the patient is Ronnell Freshwater, NP  LOS - 1    Chief Complaint  Patient presents with  . Medication Refill  . Hyperglycemia       Brief Narrative   64 year old obese male with type 2 diabetes mellitus, coronary artery disease and hypertension presented to the ED.  His CBG is in the 500s for past 3-4 days and asking for his medication refills.  He is on Januvia, glimepiride and?  Metformin which he ran out about 2 weeks back.  Reported nausea and vomiting but no abdominal pain, polyuria and polydipsia.  His blood glucose was 560 in the ED with anion gap of 26.  Admitted for hyperosmolar nonketotic state.   Subjective:   Patient frustrated and angry about breakfast tray that he received which contain pancake, syrup and applesauce.  Anion gap closed with IV fluids and insulin drip.   Assessment  & Plan :    Active Problems: Hyperosmolar nonketotic state in uncontrolled type 2 diabetes mellitus (HCC) Anion gap closed with IV insulin and aggressive IV hydration.  Started on Lantus 10 units with 3 unit Premeal aspart.  CBG in 300.  Repeat BMET.  Will increase Lantus to 14 units and premeal aspart to 7 units 3 times daily. Needs to establish care with PCP as outpatient.  Check A1c.   Active problems   AKI (acute kidney injury) ([place]) Prerenal secondary to dehydration.  Improving in a.m. labs.    Essential hypertension Resume losartan    Medical non-compliance Counseled on adherence.  Obesity (BMI 33.4 kg/m)       Code Status : Full code  Family Communication  : None  Disposition Plan  : Home possibly tomorrow if CBG stable  Barriers For Discharge : Hyperglycemia, high risk for going back into hyperosmolar nonketotic state  Consults  : None  Procedures  : None  DVT Prophylaxis  :  Lovenox -  Lab Results  Component Value Date   PLT 383 08/19/2019    Antibiotics  :    Anti-infectives (From admission, onward)   None        Objective:   Vitals:   08/20/19 0900 08/20/19 1000 08/20/19 1100 08/20/19 1227  BP:  (!) 163/82 137/78 (!) 157/73  Pulse: 85 81 84 91  Resp: (!) 25 (!) 23 (!) 25 18  Temp:    (!) 97.5 F (36.4 C)  TempSrc:    Oral  SpO2: 98% 94% 94% 100%  Weight:      Height:        Wt Readings from Last 3 Encounters:  08/19/19 108.7 kg  10/30/18 123.6 kg  10/22/18 123.4 kg     Intake/Output Summary (Last 24 hours) at 08/20/2019 1341 Last data filed at 08/20/2019 0915 Gross per 24 hour  Intake 2619.96 ml  Output 1470 ml  Net 1149.96 ml     Physical Exam  Gen: not in distress HEENT: moist mucosa, supple neck Chest: clear b/l, no added sounds CVS: N S1&S2, no murmurs,  GI: soft, NT, ND,  Musculoskeletal: warm, no edema     Data Review:    CBC Recent Labs  Lab 08/19/19 1118  WBC 7.8  HGB 17.1*  HCT 48.8  PLT 383  MCV 91.4  MCH 32.0  MCHC 35.0  RDW 11.7    Chemistries  Recent Labs  Lab 08/19/19 1118 08/19/19 1723 08/19/19 2201 08/20/19 0516  NA 122* 129* 129* 129*  K 5.0 4.4 4.1 4.0  CL 84* 97* 96* 102  CO2 12* 17* 21* 16*  GLUCOSE 560* 197* 206* 248*  BUN 38* 30* 26* 21  CREATININE 1.45* 1.07 0.95 0.78  CALCIUM 10.0 9.1 9.2 8.3*  MG  --   --   --  2.3   ------------------------------------------------------------------------------------------------------------------ No results for input(s): CHOL, HDL, LDLCALC, TRIG, CHOLHDL, LDLDIRECT in the last 72 hours.  Lab Results  Component Value Date   HGBA1C 7.6 (H) 10/22/2018    ------------------------------------------------------------------------------------------------------------------ No results for input(s): TSH, T4TOTAL, T3FREE, THYROIDAB in the last 72 hours.  Invalid input(s): FREET3 ------------------------------------------------------------------------------------------------------------------ No results for input(s): VITAMINB12, FOLATE, FERRITIN, TIBC, IRON, RETICCTPCT in the last 72 hours.  Coagulation profile No results for input(s): INR, PROTIME in the last 168 hours.  No results for input(s): DDIMER in the last 72 hours.  Cardiac Enzymes No results for input(s): CKMB, TROPONINI, MYOGLOBIN in the last 168 hours.  Invalid input(s): CK ------------------------------------------------------------------------------------------------------------------ No results found for: BNP  Inpatient Medications  Scheduled Meds: . Chlorhexidine Gluconate Cloth  6 each Topical Daily  . enoxaparin (LOVENOX) injection  40 mg Subcutaneous Daily  . insulin aspart  0-5 Units Subcutaneous QHS  . insulin aspart  0-9 Units Subcutaneous TID WC  . insulin aspart  3 Units Subcutaneous TID WC  . insulin glargine  10 Units Subcutaneous Q24H   Continuous Infusions: . sodium chloride 75 mL/hr at 08/20/19 1332   PRN Meds:.dextrose, hydrOXYzine, labetalol, oxyCODONE  Micro Results Recent Results (from the past 240 hour(s))  Respiratory Panel by RT PCR (Flu A&B, Covid) - Nasopharyngeal Swab     Status: None   Collection Time: 08/19/19 12:31 PM   Specimen: Nasopharyngeal Swab  Result Value Ref Range Status   SARS Coronavirus 2 by RT PCR NEGATIVE NEGATIVE Final    Comment: (NOTE) SARS-CoV-2 target nucleic acids are NOT DETECTED. The SARS-CoV-2 RNA is generally detectable in upper respiratoy specimens during the acute phase  of infection. The lowest concentration of SARS-CoV-2 viral copies this assay can detect is 131 copies/mL. A negative result does not preclude  SARS-Cov-2 infection and should not be used as the sole basis for treatment or other patient management decisions. A negative result may occur with  improper specimen collection/handling, submission of specimen other than nasopharyngeal swab, presence of viral mutation(s) within the areas targeted by this assay, and inadequate number of viral copies (<131 copies/mL). A negative result must be combined with clinical observations, patient history, and epidemiological information. The expected result is Negative. Fact Sheet for Patients:  https://www.moore.com/ Fact Sheet for Healthcare Providers:  https://www.young.biz/ This test is not yet ap proved or cleared by the [place] FDA and  has been authorized for detection and/or diagnosis of SARS-CoV-2 by FDA under an Emergency Use Authorization (EUA). This EUA will remain  in effect (meaning this test can be used) for the duration of the COVID-19 declaration under Section 564(b)(1) of the Act, 21 U.S.C. section 360bbb-3(b)(1), unless the authorization is terminated or revoked sooner.    Influenza A by PCR NEGATIVE NEGATIVE Final   Influenza B by PCR NEGATIVE NEGATIVE Final    Comment: (NOTE) The Xpert Xpress SARS-CoV-2/FLU/RSV assay is intended as an aid in  the diagnosis of influenza from Nasopharyngeal swab specimens and  should not be used as a sole basis for treatment. Nasal washings and  aspirates are unacceptable for Xpert Xpress SARS-CoV-2/FLU/RSV  testing. Fact Sheet for Patients: https://www.moore.com/ Fact Sheet for Healthcare Providers: https://www.young.biz/ This test is not yet approved or cleared by the [place] FDA and  has been authorized for detection and/or diagnosis of SARS-CoV-2 by  FDA under an Emergency Use Authorization (EUA). This EUA will remain  in effect (meaning this test can be used) for the duration of the  Covid-19  declaration under Section 564(b)(1) of the Act, 21  U.S.C. section 360bbb-3(b)(1), unless the authorization is  terminated or revoked. Performed at [facility], [address] [place], [place] [address]   MRSA PCR Screening     Status: None   Collection Time: 08/19/19  7:00 PM   Specimen: Nasopharyngeal  Result Value Ref Range Status   MRSA by PCR NEGATIVE NEGATIVE Final    Comment:        The GeneXpert MRSA Assay (FDA approved for NASAL specimens only), is one component of a comprehensive MRSA colonization surveillance program. It is not intended to diagnose MRSA infection nor to guide or monitor treatment for MRSA infections. Performed at [facility], [address], [place], [place] [address]     Radiology Reports No results found.  Time Spent in minutes  35   Palin Tristan M.D on 08/20/2019 at 1:41 PM  Between 7am to 7pm - Pager - [phone]  After 7pm go to [email or web address] - password [facility]  [facility] -  Office  [phone]

## 2019-08-21 DIAGNOSIS — E1100 Type 2 diabetes mellitus with hyperosmolarity without nonketotic hyperglycemic-hyperosmolar coma (NKHHC): Secondary | ICD-10-CM

## 2019-08-21 DIAGNOSIS — N179 Acute kidney failure, unspecified: Secondary | ICD-10-CM

## 2019-08-21 DIAGNOSIS — E871 Hypo-osmolality and hyponatremia: Secondary | ICD-10-CM

## 2019-08-21 LAB — BASIC METABOLIC PANEL
Anion gap: 10 (ref 5–15)
BUN: 13 mg/dL (ref 8–23)
CO2: 18 mmol/L — ABNORMAL LOW (ref 22–32)
Calcium: 8.1 mg/dL — ABNORMAL LOW (ref 8.9–10.3)
Chloride: 105 mmol/L (ref 98–111)
Creatinine, Ser: 0.8 mg/dL (ref 0.61–1.24)
GFR calc Af Amer: >60 mL/min (ref >60)
GFR calc non Af Amer: >60 mL/min (ref >60)
Glucose, Bld: 229 mg/dL — ABNORMAL HIGH (ref 70–99)
Potassium: 3.5 mmol/L (ref 3.5–5.1)
Sodium: 133 mmol/L — ABNORMAL LOW (ref 135–145)

## 2019-08-21 LAB — GLUCOSE, CAPILLARY
Glucose-Capillary: 251 mg/dL — ABNORMAL HIGH (ref 70–99)
Glucose-Capillary: 339 mg/dL — ABNORMAL HIGH (ref 70–99)

## 2019-08-21 MED ORDER — METFORMIN HCL 1000 MG PO TABS: 1000.0000 mg | ORAL_TABLET | Freq: Two times a day (BID) | ORAL | 1 refills | Status: DC

## 2019-08-21 MED ORDER — ACETAMINOPHEN 325 MG PO TABS: 650.0000 mg | ORAL_TABLET | Freq: Four times a day (QID) | ORAL | 1 refills | Status: AC | PRN

## 2019-08-21 MED ORDER — BLOOD GLUCOSE MONITOR KIT: PACK | 0 refills | Status: DC

## 2019-08-21 MED ORDER — INSULIN ASPART 100 UNIT/ML ~~LOC~~ SOLN
10.0000 [IU] | Freq: Three times a day (TID) | SUBCUTANEOUS | Status: DC
  Administered 2019-08-21 (×2): 10 [IU] via SUBCUTANEOUS
  Filled 2019-08-21 (×2): qty 1

## 2019-08-21 MED ORDER — GLIMEPIRIDE 2 MG PO TABS: 2.0000 mg | ORAL_TABLET | Freq: Every day | ORAL | 1 refills | Status: DC

## 2019-08-21 MED ORDER — LOSARTAN POTASSIUM 50 MG PO TABS: 50.0000 mg | ORAL_TABLET | Freq: Every day | ORAL | 1 refills | Status: DC

## 2019-08-21 MED ORDER — SITAGLIPTIN PHOSPHATE 100 MG PO TABS: 100.0000 mg | ORAL_TABLET | Freq: Every day | ORAL | 1 refills | Status: DC

## 2019-08-21 NOTE — Progress Notes (Signed)
The patient's IV has been removed. Discharged information has been provided. Teach back method utilized.

## 2019-08-21 NOTE — Discharge Summary (Addendum)
Physician Discharge Summary  Jeremy [name] FFM:[id number] DOB: 1955/06/25 DOA: 08/19/2019  PCP: Ronnell Freshwater, NP   Admit date: 08/19/2019 Discharge date: 08/21/2019  Admitted From: Home Disposition: Home  Recommendations for Outpatient Follow-up:  Has appointment at [facility] on 09/18/2019 at 10 AM.  Home Health: None Equipment/Devices: None  Discharge Condition: Fair CODE STATUS: Full code Diet recommendation:Carb Modified     Discharge Diagnoses:  Principal Problem:   Hyperosmolar non-ketotic state due to type 2 diabetes mellitus ([place])  Active Problems:   Hyponatremia   AKI (acute kidney injury) ([place])   Essential hypertension   Medical non-compliance Obesity (BMI 33.4 kg/m)  Brief narrative/HPI 64 year old obese male with type 2 diabetes mellitus, coronary artery disease and hypertension presented to the ED.  His CBG is in the 500s for past 3-4 days and asking for his medication refills.  He is on Januvia, glimepiride and?  Metformin which he ran out about 2 weeks back.  Reported nausea and vomiting but no abdominal pain, polyuria and polydipsia.  His blood glucose was 560 in the ED with anion gap of 26.  Admitted for hyperosmolar nonketotic state.  Hospital course  Active Problems: Hyperosmolar nonketotic state in uncontrolled type 2 diabetes mellitus (HCC) Anion gap closed with IV insulin and aggressive IV hydration.    Given Lantus and Premeal aspart.  CBG improving.  His diabetes has been significantly uncontrolled over the past 1 year (A1c worsened from 7.6, 10 months back to 13.5).  Likely in the setting of medication nonadherence (missing refilling his prescription, dietary nonadherence and lack of outpatient follow-up).  Discussed options for starting on insulin but patient refuses and wants his current home meds refilled. He has a PCP in the community and has an appointment next month.  Instructed to call and try getting an early  appointment. Resume Metformin, Amaryl and Januvia.  Blood glucose monitoring kit prescribed. Counseled on diet and medication adherence.  Patient has been angry throughout hospital stay sometimes with regards to meal trays and sometimes with regards to not getting pain medication.  He did not wish to participate with PT this morning and was hostile towards me for not prescribing him pain medication for his right foot.   Active problems   AKI (acute kidney injury) ([place]) Prerenal secondary to dehydration.    Resolved with fluids.    Essential hypertension Resume losartan    Medical non-compliance Counseled on medication adherence and follow-up.  Obesity (BMI 33.4 kg/m)  Procedure: None Disposition: Home Family communication: None  Discharge Instructions   Allergies as of 08/21/2019      Reactions   Amoxicillin Swelling   Keflex [cephalexin] Rash      Medication List    TAKE these medications   acetaminophen 325 MG tablet Commonly known as: Tylenol Take 2 tablets (650 mg total) by mouth every 6 (six) hours as needed for mild pain.   blood glucose meter kit and supplies Kit Dispense based on patient and insurance preference. Use up to four times daily as directed. (FOR ICD-9 250.00, 250.01).   glimepiride 2 MG tablet Commonly known as: AMARYL Take 1 tablet (2 mg total) by mouth daily with breakfast.   hydrOXYzine 25 MG tablet Commonly known as: ATARAX/VISTARIL Take 25 mg by mouth 3 (three) times daily as needed for itching.   losartan 50 MG tablet Commonly known as: COZAAR Take 1 tablet (50 mg total) by mouth daily.   metFORMIN 1000 MG tablet Commonly known as: GLUCOPHAGE  Take 1 tablet (1,000 mg total) by mouth 2 (two) times daily with a meal.   sitaGLIPtin 100 MG tablet Commonly known as: Januvia Take 1 tablet (100 mg total) by mouth daily.      Follow-up Information    Boscia, Heather E, NP. Schedule an appointment as soon as possible for a visit in 2  month(s).   Specialty: Family Medicine Contact information: [address] [place] [address] [phone]          Allergies  Allergen Reactions  . Amoxicillin Swelling  . Keflex [Cephalexin] Rash      Subjective: Not in distress.  Irritable for not getting pain medication.  CBG in 250s.  Discharge Exam: Vitals:   08/20/19 1931 08/21/19 0521  BP: 99/60 131/79  Pulse: 73 91  Resp: 16 20  Temp: 98.6 F (37 C) 98.2 F (36.8 C)  SpO2: 99% 100%   Vitals:   08/20/19 1100 08/20/19 1227 08/20/19 1931 08/21/19 0521  BP: 137/78 (!) 157/73 99/60 131/79  Pulse: 84 91 73 91  Resp: (!) 25 18 16 20  Temp:  (!) 97.5 F (36.4 C) 98.6 F (37 C) 98.2 F (36.8 C)  TempSrc:  Oral Oral Oral  SpO2: 94% 100% 99% 100%  Weight:      Height:        General: 64 year old obese male not in distress HEENT: Moist mucosa, supple neck Chest: Clear CVs: Normal S1-S2 GI: Soft, nondistended, nontender Musculoskeletal: Warm, no edema    The results of significant diagnostics from this hospitalization (including imaging, microbiology, ancillary and laboratory) are listed below for reference.     Microbiology: Recent Results (from the past 240 hour(s))  Respiratory Panel by RT PCR (Flu A&B, Covid) - Nasopharyngeal Swab     Status: None   Collection Time: 08/19/19 12:31 PM   Specimen: Nasopharyngeal Swab  Result Value Ref Range Status   SARS Coronavirus 2 by RT PCR NEGATIVE NEGATIVE Final    Comment: (NOTE) SARS-CoV-2 target nucleic acids are NOT DETECTED. The SARS-CoV-2 RNA is generally detectable in upper respiratoy specimens during the acute phase of infection. The lowest concentration of SARS-CoV-2 viral copies this assay can detect is 131 copies/mL. A negative result does not preclude SARS-Cov-2 infection and should not be used as the sole basis for treatment or other patient management decisions. A negative result may occur with  improper specimen collection/handling,  submission of specimen other than nasopharyngeal swab, presence of viral mutation(s) within the areas targeted by this assay, and inadequate number of viral copies (<131 copies/mL). A negative result must be combined with clinical observations, patient history, and epidemiological information. The expected result is Negative. Fact Sheet for Patients:  https://www.fda.gov/media/142436/download Fact Sheet for Healthcare Providers:  https://www.fda.gov/media/142435/download This test is not yet ap proved or cleared by the [place] FDA and  has been authorized for detection and/or diagnosis of SARS-CoV-2 by FDA under an Emergency Use Authorization (EUA). This EUA will remain  in effect (meaning this test can be used) for the duration of the COVID-19 declaration under Section 564(b)(1) of the Act, 21 U.S.C. section 360bbb-3(b)(1), unless the authorization is terminated or revoked sooner.    Influenza A by PCR NEGATIVE NEGATIVE Final   Influenza B by PCR NEGATIVE NEGATIVE Final    Comment: (NOTE) The Xpert Xpress SARS-CoV-2/FLU/RSV assay is intended as an aid in  the diagnosis of influenza from Nasopharyngeal swab specimens and  should not be used as a sole basis for treatment. Nasal   washings and  aspirates are unacceptable for Xpert Xpress SARS-CoV-2/FLU/RSV  testing. Fact Sheet for Patients: PinkCheek.be Fact Sheet for Healthcare Providers: GravelBags.it This test is not yet approved or cleared by the [place] FDA and  has been authorized for detection and/or diagnosis of SARS-CoV-2 by  FDA under an Emergency Use Authorization (EUA). This EUA will remain  in effect (meaning this test can be used) for the duration of the  Covid-19 declaration under Section 564(b)(1) of the Act, 21  U.S.C. section 360bbb-3(b)(1), unless the authorization is  terminated or revoked. Performed at [facility], [address] [place], [place] [address]   MRSA PCR Screening     Status: None   Collection Time: 08/19/19  7:00 PM   Specimen: Nasopharyngeal  Result Value Ref Range Status   MRSA by PCR NEGATIVE NEGATIVE Final    Comment:        The GeneXpert MRSA Assay (FDA approved for NASAL specimens only), is one component of a comprehensive MRSA colonization surveillance program. It is not intended to diagnose MRSA infection nor to guide or monitor treatment for MRSA infections. Performed at [facility], [address] [place], [place] [address]      Labs: BNP (last 3 results) No results for input(s): BNP in the last 8760 hours. Basic Metabolic Panel: Recent Labs  Lab 08/19/19 1723 08/19/19 2201 08/20/19 0516 08/20/19 1409 08/21/19 0507  NA 129* 129* 129* 128* 133*  K 4.4 4.1 4.0 3.9 3.5  CL 97* 96* 102 101 105  CO2 17* 21* 16* 19* 18*  GLUCOSE 197* 206* 248* 340* 229*  BUN 30* 26* _0 CREATININE 1.07 0.95 0.78 0.91 0.80  CALCIUM 9.1 9.2 8.3* 8.4* 8.1*  MG  --   --  2.3  --   --    Liver Function Tests: No results for input(s): AST, ALT, ALKPHOS, BILITOT, PROT, ALBUMIN in the last 168 hours. No results for input(s): LIPASE, AMYLASE in the last 168 hours. No results for input(s): AMMONIA in the last 168 hours. CBC: Recent Labs  Lab 08/19/19 1118  WBC 7.8  HGB 17.1*  HCT 48.8  MCV 91.4  PLT 383   Cardiac Enzymes: No results for input(s): CKTOTAL, CKMB, CKMBINDEX, TROPONINI in the last 168 hours. BNP: Invalid input(s): POCBNP CBG: Recent Labs  Lab 08/20/19 0916 08/20/19 1142 08/20/19 1636 08/20/19 2115 08/21/19 0750  GLUCAP 406* 312* 251* 328* 251*   D-Dimer No results for input(s): DDIMER in the last 72 hours. Hgb A1c Recent Labs    08/20/19 1409  HGBA1C 13.5*   Lipid Profile No results for input(s): CHOL, HDL, LDLCALC, TRIG, CHOLHDL, LDLDIRECT in the last 72 hours. Thyroid function studies No results for input(s): TSH, T4TOTAL, T3FREE,  THYROIDAB in the last 72 hours.  Invalid input(s): FREET3 Anemia work up No results for input(s): VITAMINB12, FOLATE, FERRITIN, TIBC, IRON, RETICCTPCT in the last 72 hours. Urinalysis    Component Value Date/Time   COLORURINE STRAW (A) 08/19/2019 1118   APPEARANCEUR CLEAR (A) 08/19/2019 1118   LABSPEC 1.022 08/19/2019 1118   PHURINE 5.0 08/19/2019 1118   GLUCOSEU >=500 (A) 08/19/2019 1118   HGBUR NEGATIVE 08/19/2019 1118   BILIRUBINUR NEGATIVE 08/19/2019 1118   KETONESUR 80 (A) 08/19/2019 1118   PROTEINUR NEGATIVE 08/19/2019 1118   NITRITE NEGATIVE 08/19/2019 1118   LEUKOCYTESUR NEGATIVE 08/19/2019 1118   Sepsis Labs Invalid input(s): PROCALCITONIN,  WBC,  LACTICIDVEN Microbiology Recent Results (from the past 240 hour(s))  Respiratory Panel by RT PCR (Flu A&B, Covid) - Nasopharyngeal Swab     Status: None   Collection Time: 08/19/19 12:31 PM   Specimen: Nasopharyngeal Swab  Result Value Ref Range Status   SARS Coronavirus 2 by RT PCR NEGATIVE NEGATIVE Final    Comment: (NOTE) SARS-CoV-2 target nucleic acids are NOT DETECTED. The SARS-CoV-2 RNA is generally detectable in upper respiratoy specimens during the acute phase of infection. The lowest concentration of SARS-CoV-2 viral copies this assay can detect is 131 copies/mL. A negative result does not preclude SARS-Cov-2 infection and should not be used as the sole basis for treatment or other patient management decisions. A negative result may occur with  improper specimen collection/handling, submission of specimen other than nasopharyngeal swab, presence of viral mutation(s) within the areas targeted by this assay, and inadequate number of viral copies (<131 copies/mL). A negative result must be combined with clinical observations, patient history, and epidemiological information. The expected result is Negative. Fact Sheet for Patients:  PinkCheek.be Fact Sheet for Healthcare Providers:   GravelBags.it This test is not yet ap proved or cleared by the [place] FDA and  has been authorized for detection and/or diagnosis of SARS-CoV-2 by FDA under an Emergency Use Authorization (EUA). This EUA will remain  in effect (meaning this test can be used) for the duration of the COVID-19 declaration under Section 564(b)(1) of the Act, 21 U.S.C. section 360bbb-3(b)(1), unless the authorization is terminated or revoked sooner.    Influenza A by PCR NEGATIVE NEGATIVE Final   Influenza B by PCR NEGATIVE NEGATIVE Final    Comment: (NOTE) The Xpert Xpress SARS-CoV-2/FLU/RSV assay is intended as an aid in  the diagnosis of influenza from Nasopharyngeal swab specimens and  should not be used as a sole basis for treatment. Nasal washings and  aspirates are unacceptable for Xpert Xpress SARS-CoV-2/FLU/RSV  testing. Fact Sheet for Patients: PinkCheek.be Fact Sheet for Healthcare Providers: GravelBags.it This test is not yet approved or cleared by the [place] FDA and  has been authorized for detection and/or diagnosis of SARS-CoV-2 by  FDA under an Emergency Use Authorization (EUA). This EUA will remain  in effect (meaning this test can be used) for the duration of the  Covid-19 declaration under Section 564(b)(1) of the Act, 21  U.S.C. section 360bbb-3(b)(1), unless the authorization is  terminated or revoked. Performed at [facility], [address] [place], [place] [address]   MRSA PCR Screening     Status: None   Collection Time: 08/19/19  7:00 PM   Specimen: Nasopharyngeal  Result Value Ref Range Status   MRSA by PCR NEGATIVE NEGATIVE Final    Comment:        The GeneXpert MRSA Assay (FDA approved for NASAL specimens only), is one component of a comprehensive MRSA colonization surveillance program. It is not intended to diagnose MRSA infection nor to guide or monitor  treatment for MRSA infections. Performed at [facility], [address] [place], [place] [address]      Time coordinating discharge:35 minutes  SIGNED:   Louellen Molder, MD  [facility] 08/21/2019, 10:25 AM Pager   If 7PM-7AM, please contact night-coverage [email or web address] Password [email or web address]

## 2019-08-21 NOTE — Discharge Instructions (Signed)
Blood Glucose Monitoring, Adult Monitoring your blood sugar (glucose) is an important part of managing your diabetes (diabetes mellitus). Blood glucose monitoring involves checking your blood glucose as often as directed and keeping a record (log) of your results over time. Checking your blood glucose regularly and keeping a blood glucose log can:  Help you and your health care provider adjust your diabetes management plan as needed, including your medicines or insulin.  Help you understand how food, exercise, illnesses, and medicines affect your blood glucose.  Let you know what your blood glucose is at any time. You can quickly find out if you have low blood glucose (hypoglycemia) or high blood glucose (hyperglycemia). Your health care provider will set individualized treatment goals for you. Your goals will be based on your age, other medical conditions you have, and how you respond to diabetes treatment. Generally, the goal of treatment is to maintain the following blood glucose levels:  Before meals (preprandial): 80-130 mg/dL (4.4-7.2 mmol/L).  After meals (postprandial): below 180 mg/dL (10 mmol/L).  A1c level: less than 7%. Supplies needed:  Blood glucose meter.  Test strips for your meter. Each meter has its own strips. You must use the strips that came with your meter.  A needle to prick your finger (lancet). Do not use a lancet more than one time.  A device that holds the lancet (lancing device).  A journal or log book to write down your results. How to check your blood glucose  1. Wash your hands with soap and water. 2. Prick the side of your finger (not the tip) with the lancet. Use a different finger each time. 3. Gently rub the finger until a small drop of blood appears. 4. Follow instructions that come with your meter for inserting the test strip, applying blood to the strip, and using your blood glucose meter. 5. Write down your result and any notes. Some meters  allow you to use areas of your body other than your finger (alternative sites) to test your blood. The most common alternative sites are:  Forearm.  Thigh.  Palm of the hand. If you think you may have hypoglycemia, or if you have a history of not knowing when your blood glucose is getting low (hypoglycemia unawareness), do not use alternative sites. Use your finger instead. Alternative sites may not be as accurate as the fingers, because blood flow is slower in these areas. This means that the result you get may be delayed, and it may be different from the result that you would get from your finger. Follow these instructions at home: Blood glucose log   Every time you check your blood glucose, write down your result. Also write down any notes about things that may be affecting your blood glucose, such as your diet and exercise for the day. This information can help you and your health care provider: ? Look for patterns in your blood glucose over time. ? Adjust your diabetes management plan as needed.  Check if your meter allows you to download your records to a computer. Most glucose meters store a record of glucose readings in the meter. If you have type 1 diabetes:  Check your blood glucose 2 or more times a day.  Also check your blood glucose: ? Before every insulin injection. ? Before and after exercise. ? Before meals. ? 2 hours after a meal. ? Occasionally between 2:00 a.m. and 3:00 a.m., as directed. ? Before potentially dangerous tasks, like driving or using heavy machinery. ?   At bedtime.  You may need to check your blood glucose more often, up to 6-10 times a day, if you: ? Use an insulin pump. ? Need multiple daily injections (MDI). ? Have diabetes that is not well-controlled. ? Are ill. ? Have a history of severe hypoglycemia. ? Have hypoglycemia unawareness. If you have type 2 diabetes:  If you take insulin or other diabetes medicines, check your blood glucose 2 or  more times a day.  If you are on intensive insulin therapy, check your blood glucose 4 or more times a day. Occasionally, you may also need to check between 2:00 a.m. and 3:00 a.m., as directed.  Also check your blood glucose: ? Before and after exercise. ? Before potentially dangerous tasks, like driving or using heavy machinery.  You may need to check your blood glucose more often if: ? Your medicine is being adjusted. ? Your diabetes is not well-controlled. ? You are ill. General tips  Always keep your supplies with you.  If you have questions or need help, all blood glucose meters have a 24-hour "hotline" phone number that you can call. You may also contact your health care provider.  After you use a few boxes of test strips, adjust (calibrate) your blood glucose meter by following instructions that came with your meter. Contact a health care provider if:  Your blood glucose is at or above 240 mg/dL (37.4 mmol/L) for 2 days in a row.  You have been sick or have had a fever for 2 days or longer, and you are not getting better.  You have any of the following problems for more than 6 hours: ? You cannot eat or drink. ? You have nausea or vomiting. ? You have diarrhea. Get help right away if:  Your blood glucose is lower than 54 mg/dL (3 mmol/L).  You become confused or you have trouble thinking clearly.  You have difficulty breathing.  You have moderate or large ketone levels in your urine. Summary  Monitoring your blood sugar (glucose) is an important part of managing your diabetes (diabetes mellitus).  Blood glucose monitoring involves checking your blood glucose as often as directed and keeping a record (log) of your results over time.  Your health care provider will set individualized treatment goals for you. Your goals will be based on your age, other medical conditions you have, and how you respond to diabetes treatment.  Every time you check your blood glucose,  write down your result. Also write down any notes about things that may be affecting your blood glucose, such as your diet and exercise for the day. This information is not intended to replace advice given to you by your health care provider. Make sure you discuss any questions you have with your health care provider. Document Revised: 03/24/2018 Document Reviewed: 11/10/2015 Elsevier Patient Education  2020 Elsevier Inc.    Diabetes Mellitus and Nutrition, Adult When you have diabetes (diabetes mellitus), it is very important to have healthy eating habits because your blood sugar (glucose) levels are greatly affected by what you eat and drink. Eating healthy foods in the appropriate amounts, at about the same times every day, can help you:  Control your blood glucose.  Lower your risk of heart disease.  Improve your blood pressure.  Reach or maintain a healthy weight. Every person with diabetes is different, and each person has different needs for a meal plan. Your health care provider may recommend that you work with a diet and nutrition  specialist (dietitian) to make a meal plan that is best for you. Your meal plan may vary depending on factors such as:  The calories you need.  The medicines you take.  Your weight.  Your blood glucose, blood pressure, and cholesterol levels.  Your activity level.  Other health conditions you have, such as heart or kidney disease. How do carbohydrates affect me? Carbohydrates, also called carbs, affect your blood glucose level more than any other type of food. Eating carbs naturally raises the amount of glucose in your blood. Carb counting is a method for keeping track of how many carbs you eat. Counting carbs is important to keep your blood glucose at a healthy level, especially if you use insulin or take certain oral diabetes medicines. It is important to know how many carbs you can safely have in each meal. This is different for every person.  Your dietitian can help you calculate how many carbs you should have at each meal and for each snack. Foods that contain carbs include:  Bread, cereal, rice, pasta, and crackers.  Potatoes and corn.  Peas, beans, and lentils.  Milk and yogurt.  Fruit and juice.  Desserts, such as cakes, cookies, ice cream, and candy. How does alcohol affect me? Alcohol can cause a sudden decrease in blood glucose (hypoglycemia), especially if you use insulin or take certain oral diabetes medicines. Hypoglycemia can be a life-threatening condition. Symptoms of hypoglycemia (sleepiness, dizziness, and confusion) are similar to symptoms of having too much alcohol. If your health care provider says that alcohol is safe for you, follow these guidelines:  Limit alcohol intake to no more than 1 drink per day for nonpregnant women and 2 drinks per day for men. One drink equals 12 oz of beer, 5 oz of wine, or 1 oz of hard liquor.  Do not drink on an empty stomach.  Keep yourself hydrated with water, diet soda, or unsweetened iced tea.  Keep in mind that regular soda, juice, and other mixers may contain a lot of sugar and must be counted as carbs. What are tips for following this plan?  Reading food labels  Start by checking the serving size on the "Nutrition Facts" label of packaged foods and drinks. The amount of calories, carbs, fats, and other nutrients listed on the label is based on one serving of the item. Many items contain more than one serving per package.  Check the total grams (g) of carbs in one serving. You can calculate the number of servings of carbs in one serving by dividing the total carbs by 15. For example, if a food has 30 g of total carbs, it would be equal to 2 servings of carbs.  Check the number of grams (g) of saturated and trans fats in one serving. Choose foods that have low or no amount of these fats.  Check the number of milligrams (mg) of salt (sodium) in one serving. Most  people should limit total sodium intake to less than 2,300 mg per day.  Always check the nutrition information of foods labeled as "low-fat" or "nonfat". These foods may be higher in added sugar or refined carbs and should be avoided.  Talk to your dietitian to identify your daily goals for nutrients listed on the label. Shopping  Avoid buying canned, premade, or processed foods. These foods tend to be high in fat, sodium, and added sugar.  Shop around the outside edge of the grocery store. This includes fresh fruits and vegetables, bulk grains,  fresh meats, and fresh dairy. Cooking  Use low-heat cooking methods, such as baking, instead of high-heat cooking methods like deep frying.  Cook using healthy oils, such as olive, canola, or sunflower oil.  Avoid cooking with butter, cream, or high-fat meats. Meal planning  Eat meals and snacks regularly, preferably at the same times every day. Avoid going long periods of time without eating.  Eat foods high in fiber, such as fresh fruits, vegetables, beans, and whole grains. Talk to your dietitian about how many servings of carbs you can eat at each meal.  Eat 4-6 ounces (oz) of lean protein each day, such as lean meat, chicken, fish, eggs, or tofu. One oz of lean protein is equal to: ? 1 oz of meat, chicken, or fish. ? 1 egg. ?  cup of tofu.  Eat some foods each day that contain healthy fats, such as avocado, nuts, seeds, and fish. Lifestyle  Check your blood glucose regularly.  Exercise regularly as told by your health care provider. This may include: ? 150 minutes of moderate-intensity or vigorous-intensity exercise each week. This could be brisk walking, biking, or water aerobics. ? Stretching and doing strength exercises, such as yoga or weightlifting, at least 2 times a week.  Take medicines as told by your health care provider.  Do not use any products that contain nicotine or tobacco, such as cigarettes and e-cigarettes. If  you need help quitting, ask your health care provider.  Work with a Veterinary surgeon or diabetes educator to identify strategies to manage stress and any emotional and social challenges. Questions to ask a health care provider  Do I need to meet with a diabetes educator?  Do I need to meet with a dietitian?  What number can I call if I have questions?  When are the best times to check my blood glucose? Where to find more information:  American Diabetes Association: diabetes.org  Academy of Nutrition and Dietetics: www.eatright.[employer] of Diabetes and Digestive and Kidney Diseases (NIH): CarFli[employer].tn Summary  A healthy meal plan will help you control your blood glucose and maintain a healthy lifestyle.  Working with a diet and nutrition specialist (dietitian) can help you make a meal plan that is best for you.  Keep in mind that carbohydrates (carbs) and alcohol have immediate effects on your blood glucose levels. It is important to count carbs and to use alcohol carefully. This information is not intended to replace advice given to you by your health care provider. Make sure you discuss any questions you have with your health care provider. Document Revised: 05/13/2017 Document Reviewed: 07/05/2016 Elsevier Patient Education  2020 [employer].

## 2019-08-21 NOTE — TOC Transition Note (Signed)
Transition of Care [facility]) - CM/SW Discharge Note   Patient Details  Name: Jeremy [name] MRN: [id number] Date of Birth: 01-Nov-1955  [facility] CM/SW Contact:  [name], LCSW Phone Number: 08/21/2019, 10:52 AM   Clinical Narrative: Tried calling [facility] again this morning but no answer. [facility] is not accepting new patients. [name] is booked out for March and cannot make appts for April yet. [name] hung up on this CSW. Unable to reach anyone when I called back. [facility] has an appt available on April 6 at 1:20 with Dr. Ephraim Hamburger. Patient is agreeable. Information added to AVS. No further concerns. Patient has orders to discharge home today. CSW signing off.    Final next level of care: Home/Self Care Barriers to Discharge: Barriers Resolved   Patient Goals and CMS Choice        Discharge Placement                Patient to be transferred to facility by: Wife will pick him up.   Patient and family notified of of transfer: 08/21/19  Discharge Plan and Services                                     Social Determinants of Health (SDOH) Interventions     Readmission Risk Interventions Readmission Risk Prevention Plan 02/13/2018  PCP or Specialist Appt within 5-7 Days Complete  Some recent data might be hidden

## 2019-08-21 NOTE — Progress Notes (Signed)
PT Cancellation Note  Patient Details Name: Jeremy [name] MRN: [id number] DOB: April 07, 1956   Cancelled Treatment:    Reason Eval/Treat Not Completed: Patient declined, no reason specified Attempted earlier this date and ended up assisting pt to the bathroom, pt indicated at that point that he likely would not be interested in much PT.  When I arrived later to try and see him he, in no uncertain terms, told the PT to "Get out of my room!" and he threatened to get up and make me leave.  PT deferred and orders will be completed.  Malachi Pro, DPT 08/21/2019, 9:40 AM

## 2019-08-21 NOTE — Plan of Care (Addendum)
The patient has been discharged on the wheelchair to go home.  Problem: Education: Goal: Knowledge of General Education information will improve Description: Including pain rating scale, medication(s)/side effects and non-pharmacologic comfort measures Outcome: Completed/Met   Problem: Health Behavior/Discharge Planning: Goal: Ability to manage health-related needs will improve Outcome: Completed/Met   Problem: Clinical Measurements: Goal: Ability to maintain clinical measurements within normal limits will improve Outcome: Completed/Met Goal: Will remain free from infection Outcome: Completed/Met Goal: Diagnostic test results will improve Outcome: Completed/Met Goal: Respiratory complications will improve Outcome: Completed/Met Goal: Cardiovascular complication will be avoided Outcome: Completed/Met   Problem: Activity: Goal: Risk for activity intolerance will decrease Outcome: Completed/Met   Problem: Nutrition: Goal: Adequate nutrition will be maintained Outcome: Completed/Met   Problem: Coping: Goal: Level of anxiety will decrease Outcome: Completed/Met   Problem: Elimination: Goal: Will not experience complications related to bowel motility Outcome: Completed/Met Goal: Will not experience complications related to urinary retention Outcome: Completed/Met   Problem: Pain Managment: Goal: General experience of comfort will improve Outcome: Completed/Met   Problem: Safety: Goal: Ability to remain free from injury will improve Outcome: Completed/Met   Problem: Skin Integrity: Goal: Risk for impaired skin integrity will decrease Outcome: Completed/Met   Problem: Education: Goal: Ability to describe self-care measures that may prevent or decrease complications (Diabetes Survival Skills Education) will improve Outcome: Completed/Met Goal: Individualized Educational Video(s) Outcome: Completed/Met   Problem: Coping: Goal: Ability to adjust to condition or change  in health will improve Outcome: Completed/Met   Problem: Fluid Volume: Goal: Ability to maintain a balanced intake and output will improve Outcome: Completed/Met   Problem: Health Behavior/Discharge Planning: Goal: Ability to identify and utilize available resources and services will improve Outcome: Completed/Met Goal: Ability to manage health-related needs will improve Outcome: Completed/Met   Problem: Metabolic: Goal: Ability to maintain appropriate glucose levels will improve Outcome: Completed/Met   Problem: Nutritional: Goal: Maintenance of adequate nutrition will improve Outcome: Completed/Met Goal: Progress toward achieving an optimal weight will improve Outcome: Completed/Met   Problem: Skin Integrity: Goal: Risk for impaired skin integrity will decrease Outcome: Completed/Met   Problem: Tissue Perfusion: Goal: Adequacy of tissue perfusion will improve Outcome: Completed/Met   Problem: Education: Goal: Ability to describe self-care measures that may prevent or decrease complications (Diabetes Survival Skills Education) will improve Outcome: Completed/Met Goal: Individualized Educational Video(s) Outcome: Completed/Met   Problem: Coping: Goal: Ability to adjust to condition or change in health will improve Outcome: Completed/Met   Problem: Fluid Volume: Goal: Ability to maintain a balanced intake and output will improve Outcome: Completed/Met   Problem: Health Behavior/Discharge Planning: Goal: Ability to identify and utilize available resources and services will improve Outcome: Completed/Met Goal: Ability to manage health-related needs will improve Outcome: Completed/Met   Problem: Metabolic: Goal: Ability to maintain appropriate glucose levels will improve Outcome: Completed/Met

## 2019-08-21 NOTE — Progress Notes (Addendum)
The patient is upset. The patient has stated " I will through myself in the floor to sue this doctor and show him". The patient refused bed and chair alarm.

## 2019-09-02 ENCOUNTER — Encounter: Payer: Self-pay | Admitting: Emergency Medicine

## 2019-09-02 ENCOUNTER — Emergency Department
Admission: EM | Admit: 2019-09-02 | Discharge: 2019-09-02 | Disposition: A | Discharge status: Home / Self Care | Payer: Medicaid Other | Attending: Emergency Medicine | Admitting: Emergency Medicine

## 2019-09-02 ENCOUNTER — Other Ambulatory Visit: Payer: Self-pay

## 2019-09-02 DIAGNOSIS — I2510 Atherosclerotic heart disease of native coronary artery without angina pectoris: Secondary | ICD-10-CM | POA: Insufficient documentation

## 2019-09-02 DIAGNOSIS — L03115 Cellulitis of right lower limb: Secondary | ICD-10-CM | POA: Diagnosis not present

## 2019-09-02 DIAGNOSIS — M79604 Pain in right leg: Secondary | ICD-10-CM | POA: Diagnosis present

## 2019-09-02 DIAGNOSIS — Z79899 Other long term (current) drug therapy: Secondary | ICD-10-CM | POA: Insufficient documentation

## 2019-09-02 DIAGNOSIS — E119 Type 2 diabetes mellitus without complications: Secondary | ICD-10-CM | POA: Insufficient documentation

## 2019-09-02 DIAGNOSIS — I119 Hypertensive heart disease without heart failure: Secondary | ICD-10-CM | POA: Insufficient documentation

## 2019-09-02 DIAGNOSIS — F17210 Nicotine dependence, cigarettes, uncomplicated: Secondary | ICD-10-CM | POA: Insufficient documentation

## 2019-09-02 DIAGNOSIS — Z7984 Long term (current) use of oral hypoglycemic drugs: Secondary | ICD-10-CM | POA: Insufficient documentation

## 2019-09-02 LAB — CBC WITH DIFFERENTIAL/PLATELET
Abs Immature Granulocytes: 0.03 10*3/uL (ref 0.00–0.07)
Basophils Absolute: 0.1 10*3/uL (ref 0.0–0.1)
Basophils Relative: 1 %
Eosinophils Absolute: 0.2 10*3/uL (ref 0.0–0.5)
Eosinophils Relative: 2 %
HCT: 39 % (ref 39.0–52.0)
Hemoglobin: 12.7 g/dL — ABNORMAL LOW (ref 13.0–17.0)
Immature Granulocytes: 0 %
Lymphocytes Relative: 17 %
Lymphs Abs: 1.6 10*3/uL (ref 0.7–4.0)
MCH: 31.3 pg (ref 26.0–34.0)
MCHC: 32.6 g/dL (ref 30.0–36.0)
MCV: 96.1 fL (ref 80.0–100.0)
Monocytes Absolute: 0.5 10*3/uL (ref 0.1–1.0)
Monocytes Relative: 5 %
Neutro Abs: 6.9 10*3/uL (ref 1.7–7.7)
Neutrophils Relative %: 75 %
Platelets: 608 10*3/uL — ABNORMAL HIGH (ref 150–400)
RBC: 4.06 MIL/uL — ABNORMAL LOW (ref 4.22–5.81)
RDW: 12 % (ref 11.5–15.5)
WBC: 9.3 10*3/uL (ref 4.0–10.5)
nRBC: 0 % (ref 0.0–0.2)

## 2019-09-02 LAB — BASIC METABOLIC PANEL
Anion gap: 10 (ref 5–15)
BUN: 18 mg/dL (ref 8–23)
CO2: 24 mmol/L (ref 22–32)
Calcium: 9.8 mg/dL (ref 8.9–10.3)
Chloride: 103 mmol/L (ref 98–111)
Creatinine, Ser: 1.21 mg/dL (ref 0.61–1.24)
GFR calc Af Amer: >60 mL/min (ref >60)
GFR calc non Af Amer: >60 mL/min (ref >60)
Glucose, Bld: 191 mg/dL — ABNORMAL HIGH (ref 70–99)
Potassium: 4.5 mmol/L (ref 3.5–5.1)
Sodium: 137 mmol/L (ref 135–145)

## 2019-09-02 MED ORDER — CLINDAMYCIN HCL 300 MG PO CAPS: 300.0000 mg | ORAL_CAPSULE | Freq: Three times a day (TID) | ORAL | 0 refills | Status: AC

## 2019-09-02 MED ORDER — CLINDAMYCIN HCL 150 MG PO CAPS
300.0000 mg | ORAL_CAPSULE | Freq: Once | ORAL | Status: AC
  Administered 2019-09-02: 300 mg via ORAL
  Filled 2019-09-02: qty 2

## 2019-09-02 MED ORDER — HYDROCODONE-ACETAMINOPHEN 5-325 MG PO TABS: 1.0000 | ORAL_TABLET | ORAL | 0 refills | Status: AC | PRN

## 2019-09-02 MED ORDER — HYDROCODONE-ACETAMINOPHEN 5-325 MG PO TABS
1.0000 | ORAL_TABLET | Freq: Once | ORAL | Status: AC
  Administered 2019-09-02: 1 via ORAL
  Filled 2019-09-02: qty 1

## 2019-09-02 NOTE — ED Notes (Signed)
ED Provider at bedside. 

## 2019-09-02 NOTE — ED Triage Notes (Signed)
Pt to ED via POV. Pt c/o sweating and infection in his right foot. Pt has open wound on the lateral right ankle. Pt states that wound has been there for years and is not bother him but that he has dry skin on his lower leg that starts "sweating". Pt has large area of dry, scaly skin on right lower leg and ankle with some areas that appears to be raw. Pt also appear to have swelling in his ankle. Pt VSS, Pt is in NAD.

## 2019-09-02 NOTE — ED Provider Notes (Signed)
[facility] Emergency Department Provider Note  Time seen: 6:10 PM  I have reviewed the triage vital signs and the nursing notes.   HISTORY  Chief Complaint Wound Check  HPI Jeremy [name] is a 64 y.o. male with a past medical history of CAD, diabetes, hypertension, presents to the emergency department for right lower extremity wound. According to the patient for the past 4 days or so now he has been experiencing significant itching of his right lower extremity above his ankle. He has been using his hand and sometimes his other foot/toenails to scratch the area. States he has noticed a little bit of oozing from the area as of recently so he came to the emergency department for evaluation. Denies any fever. Patient does state some pain to the area moderate dull pain. Denies any cough or shortness of breath. Largely negative review of systems.  Past Medical History:  Diagnosis Date  . Coronary artery disease   . Diabetes mellitus without complication ([place])   . Hypertension   . S/P CABG x 1     Patient Active Problem List   Diagnosis Date Noted  . Hyperosmolar non-ketotic state due to type 2 diabetes mellitus ([place]) 08/21/2019  . Hyponatremia 08/19/2019  . AKI (acute kidney injury) ([place]) 08/19/2019  . Essential hypertension 08/19/2019  . Medical non-compliance 08/19/2019  . Post-traumatic osteoarthritis of right ankle 10/23/2018  . Skin ulcer of right ankle with fat layer exposed ([place]) 10/23/2018  . Status post ORIF of fracture of ankle 10/23/2018  . Diabetic foot infection ([place]) 10/22/2018  . Pressure injury of skin 02/11/2018    Past Surgical History:  Procedure Laterality Date  . ANKLE ARTHROSCOPY W/ OPEN REPAIR Right   . CORONARY ARTERY BYPASS GRAFT      Prior to Admission medications   Medication Sig Start Date End Date Taking? Authorizing Provider  acetaminophen (TYLENOL) 325 MG tablet Take 2 tablets (650 mg total) by mouth every 6 (six) hours as  needed for mild pain. 08/21/19 08/20/20  Dhungel, Flonnie Overman, MD  blood glucose meter kit and supplies KIT Dispense based on patient and insurance preference. Use up to four times daily as directed. (FOR ICD-9 250.00, 250.01). 08/21/19   Dhungel, Nishant, MD  glimepiride (AMARYL) 2 MG tablet Take 1 tablet (2 mg total) by mouth daily with breakfast. 08/21/19   Dhungel, Nishant, MD  hydrOXYzine (ATARAX/VISTARIL) 25 MG tablet Take 25 mg by mouth 3 (three) times daily as needed for itching.    [provider]  losartan (COZAAR) 50 MG tablet Take 1 tablet (50 mg total) by mouth daily. 08/21/19   Dhungel, Flonnie Overman, MD  metFORMIN (GLUCOPHAGE) 1000 MG tablet Take 1 tablet (1,000 mg total) by mouth 2 (two) times daily with a meal. 08/21/19   Dhungel, Nishant, MD  sitaGLIPtin (JANUVIA) 100 MG tablet Take 1 tablet (100 mg total) by mouth daily. 08/21/19   Dhungel, Flonnie Overman, MD    Allergies  Allergen Reactions  . Amoxicillin Swelling  . Keflex [Cephalexin] Rash    Family History  Problem Relation Age of Onset  . Heart attack Father     Social History Social History   Tobacco Use  . Smoking status: Current Some Day Smoker    Packs/day: 0.50    Types: Cigarettes  . Smokeless tobacco: Former Network engineer Use Topics  . Alcohol use: Not Currently  . Drug use: Yes    Types: Marijuana    Review of Systems Constitutional: Negative for fever Cardiovascular:  Negative for chest pain. Respiratory: Negative for shortness of breath. Gastrointestinal: Negative for abdominal pain Musculoskeletal: Right leg discomfort/itching Skin: Dry skin and itching in the right lower extremity. Neurological: Negative for headache All other ROS negative  ____________________________________________   PHYSICAL EXAM:  VITAL SIGNS: ED Triage Vitals  Enc Vitals Group     BP 09/02/19 1526 133/67     Pulse Rate 09/02/19 1526 92     Resp 09/02/19 1526 18     Temp 09/02/19 1526 97.7 F (36.5 C)     Temp Source  09/02/19 1526 Oral     SpO2 09/02/19 1526 100 %     Weight 09/02/19 1527 240 lb (108.9 kg)     Height 09/02/19 1527 '5\' 11"'  (1.803 m)     Head Circumference --      Peak Flow --      Pain Score 09/02/19 1527 10     Pain Loc --      Pain Edu? --      Excl. in [place]? --    Constitutional: Alert and oriented. Well appearing and in no distress. Eyes: Normal exam ENT      Head: Normocephalic and atraumatic.      Mouth/Throat: Mucous membranes are moist. Cardiovascular: Normal rate, regular rhythm.  Respiratory: Normal respiratory effort without tachypnea nor retractions. Breath sounds are clear  Gastrointestinal: Soft and nontender. No distention.   Musculoskeletal: Nontender with normal range of motion in all extremities.  Neurologic:  Normal speech and language. No gross focal neurologic deficits  Skin:  Skin is warm Psychiatric: Mood and affect are normal.   ____________________________________________   INITIAL IMPRESSION / ASSESSMENT AND PLAN / ED COURSE  Pertinent labs & imaging results that were available during my care of the patient were reviewed by me and considered in my medical decision making (see chart for details).   Patient presents emergency department for right lower extremity evaluation. Patient states he has had very dry skin to this area has been itching it with his hands and sometimes toenails. Patient states it has become more painful and weeping at times over the past several days. Examination is most consistent with significant dry skin with excoriation possible slight superinfection. No deep wounds noted or ulcerations. Reassuring labs including a normal white blood cell count. We will place the patient on antibiotics for possible superinfection. We will wrap the area and have patient follow-up with wound clinic. We will discharge with a very short course of pain medication. Overall the patient appears extremely well, no distress reassuring labs and vitals with a  reassuring physical exam.  [name] was evaluated in Emergency Department on 09/02/2019 for the symptoms described in the history of present illness. He was evaluated in the context of the global COVID-19 pandemic, which necessitated consideration that the patient might be at risk for infection with the SARS-CoV-2 virus that causes COVID-19. Institutional protocols and algorithms that pertain to the evaluation of patients at risk for COVID-19 are in a state of rapid change based on information released by regulatory bodies including the CDC and federal and state organizations. These policies and algorithms were followed during the patient's care in the ED.  ____________________________________________   FINAL CLINICAL IMPRESSION(S) / ED DIAGNOSES  Right lower extremity cellulitis   Harvest Dark, MD 09/02/19 1815

## 2019-09-19 ENCOUNTER — Other Ambulatory Visit: Payer: Self-pay

## 2019-09-19 ENCOUNTER — Emergency Department
Admission: EM | Admit: 2019-09-19 | Discharge: 2019-09-19 | Disposition: A | Discharge status: Home / Self Care | Payer: Medicaid Other | Attending: Student | Admitting: Student

## 2019-09-19 DIAGNOSIS — I10 Essential (primary) hypertension: Secondary | ICD-10-CM | POA: Insufficient documentation

## 2019-09-19 DIAGNOSIS — Z7984 Long term (current) use of oral hypoglycemic drugs: Secondary | ICD-10-CM | POA: Insufficient documentation

## 2019-09-19 DIAGNOSIS — L02415 Cutaneous abscess of right lower limb: Secondary | ICD-10-CM | POA: Diagnosis not present

## 2019-09-19 DIAGNOSIS — Z951 Presence of aortocoronary bypass graft: Secondary | ICD-10-CM | POA: Insufficient documentation

## 2019-09-19 DIAGNOSIS — L03115 Cellulitis of right lower limb: Secondary | ICD-10-CM | POA: Diagnosis not present

## 2019-09-19 DIAGNOSIS — E119 Type 2 diabetes mellitus without complications: Secondary | ICD-10-CM | POA: Insufficient documentation

## 2019-09-19 DIAGNOSIS — Z79899 Other long term (current) drug therapy: Secondary | ICD-10-CM | POA: Diagnosis not present

## 2019-09-19 DIAGNOSIS — L0291 Cutaneous abscess, unspecified: Secondary | ICD-10-CM

## 2019-09-19 DIAGNOSIS — R2241 Localized swelling, mass and lump, right lower limb: Secondary | ICD-10-CM | POA: Diagnosis present

## 2019-09-19 DIAGNOSIS — F17210 Nicotine dependence, cigarettes, uncomplicated: Secondary | ICD-10-CM | POA: Diagnosis not present

## 2019-09-19 DIAGNOSIS — I2510 Atherosclerotic heart disease of native coronary artery without angina pectoris: Secondary | ICD-10-CM | POA: Diagnosis not present

## 2019-09-19 MED ORDER — HYDROCODONE-ACETAMINOPHEN 5-325 MG PO TABS: 1.0000 | ORAL_TABLET | Freq: Four times a day (QID) | ORAL | 0 refills | Status: AC | PRN

## 2019-09-19 MED ORDER — HYDROCODONE-ACETAMINOPHEN 5-325 MG PO TABS
1.0000 | ORAL_TABLET | Freq: Once | ORAL | Status: AC
  Administered 2019-09-19: 1 via ORAL
  Filled 2019-09-19: qty 1

## 2019-09-19 MED ORDER — CLINDAMYCIN HCL 150 MG PO CAPS
300.0000 mg | ORAL_CAPSULE | Freq: Once | ORAL | Status: AC
  Administered 2019-09-19: 300 mg via ORAL
  Filled 2019-09-19: qty 2

## 2019-09-19 MED ORDER — CLINDAMYCIN HCL 300 MG PO CAPS: 300.0000 mg | ORAL_CAPSULE | Freq: Three times a day (TID) | ORAL | 0 refills | Status: AC

## 2019-09-19 NOTE — ED Provider Notes (Signed)
[facility] Emergency Department Provider Note ____________________________________________  Time seen: 7902  I have reviewed the triage vital signs and the nursing notes.  HISTORY  Chief Complaint  Abscess  HPI Jeremy [name] is a 64 y.o. male with a history of pressure wounds of the skin, DM, hyponatremia, AKI and hypertension, presents  to the ED for evaluation of an infection to the right lower leg.  Patient was evaluated in the ED 2 weeks prior for similar complaints.  He was treated empirically with clindamycin and given pain medicines.  He failed to follow-up with wound care as directed, presents now with what he thinks is an abscess to the medial aspect of the right leg.  He is requesting that the area be lanced.  He denies any interim fever, chills, or sweats.  He describes managing his leg with daily washing and moisturizing with Neosporin.  He denies any spontaneous purulent drainage, chest pain, or shortness of breath.  His primary complaint is pain to the area.  Past Medical History:  Diagnosis Date  . Coronary artery disease   . Diabetes mellitus without complication ([place])   . Hypertension   . S/P CABG x 1     Patient Active Problem List   Diagnosis Date Noted  . Hyperosmolar non-ketotic state due to type 2 diabetes mellitus ([place]) 08/21/2019  . Hyponatremia 08/19/2019  . AKI (acute kidney injury) ([place]) 08/19/2019  . Essential hypertension 08/19/2019  . Medical non-compliance 08/19/2019  . Post-traumatic osteoarthritis of right ankle 10/23/2018  . Skin ulcer of right ankle with fat layer exposed ([place]) 10/23/2018  . Status post ORIF of fracture of ankle 10/23/2018  . Diabetic foot infection ([place]) 10/22/2018  . Pressure injury of skin 02/11/2018    Past Surgical History:  Procedure Laterality Date  . ANKLE ARTHROSCOPY W/ OPEN REPAIR Right   . CORONARY ARTERY BYPASS GRAFT      Prior to Admission medications   Medication Sig Start Date End  Date Taking? Authorizing Provider  acetaminophen (TYLENOL) 325 MG tablet Take 2 tablets (650 mg total) by mouth every 6 (six) hours as needed for mild pain. 08/21/19 08/20/20  Dhungel, Flonnie Overman, MD  blood glucose meter kit and supplies KIT Dispense based on patient and insurance preference. Use up to four times daily as directed. (FOR ICD-9 250.00, 250.01). 08/21/19   Dhungel, Nishant, MD  clindamycin (CLEOCIN) 300 MG capsule Take 1 capsule (300 mg total) by mouth 3 (three) times daily for 10 days. 09/19/19 09/29/19  Zyheir Daft, Dannielle Karvonen, PA-C  glimepiride (AMARYL) 2 MG tablet Take 1 tablet (2 mg total) by mouth daily with breakfast. 08/21/19   Dhungel, Flonnie Overman, MD  HYDROcodone-acetaminophen (NORCO) 5-325 MG tablet Take 1 tablet by mouth every 6 (six) hours as needed for up to 3 days. 09/19/19 09/22/19  Katharin Schneider, Dannielle Karvonen, PA-C  hydrOXYzine (ATARAX/VISTARIL) 25 MG tablet Take 25 mg by mouth 3 (three) times daily as needed for itching.    [provider]  losartan (COZAAR) 50 MG tablet Take 1 tablet (50 mg total) by mouth daily. 08/21/19   Dhungel, Flonnie Overman, MD  metFORMIN (GLUCOPHAGE) 1000 MG tablet Take 1 tablet (1,000 mg total) by mouth 2 (two) times daily with a meal. 08/21/19   Dhungel, Nishant, MD  sitaGLIPtin (JANUVIA) 100 MG tablet Take 1 tablet (100 mg total) by mouth daily. 08/21/19   Dhungel, Flonnie Overman, MD    Allergies Amoxicillin and Keflex [cephalexin]  Family History  Problem Relation Age of Onset  .  Heart attack Father     Social History Social History   Tobacco Use  . Smoking status: Current Some Day Smoker    Packs/day: 0.50    Types: Cigarettes  . Smokeless tobacco: Former Network engineer Use Topics  . Alcohol use: Not Currently  . Drug use: Yes    Types: Marijuana    Review of Systems  Constitutional: Negative for fever. Cardiovascular: Negative for chest pain. Respiratory: Negative for shortness of breath. Musculoskeletal: Negative for back pain. Skin: Negative for  rash. RLE cellulitis as above Neurological: Negative for headaches, focal weakness or numbness. ____________________________________________  PHYSICAL EXAM:  VITAL SIGNS: ED Triage Vitals  Enc Vitals Group     BP 09/19/19 1219 126/69     Pulse Rate 09/19/19 1219 81     Resp 09/19/19 1219 18     Temp 09/19/19 1219 97.9 F (36.6 C)     Temp Source 09/19/19 1219 Oral     SpO2 09/19/19 1219 100 %     Weight 09/19/19 1210 240 lb (108.9 kg)     Height 09/19/19 1210 _0  (1.803 m)     Head Circumference --      Peak Flow --      Pain Score 09/19/19 1210 10     Pain Loc --      Pain Edu? --      Excl. in [place]? --     Constitutional: Alert and oriented. Well appearing and in no distress. Head: Normocephalic and atraumatic. Eyes: Conjunctivae are normal. Normal extraocular movements Cardiovascular: Normal rate, regular rhythm. Normal distal pulses.  2+ pitting edema to the right lower extremity. Respiratory: Normal respiratory effort. No wheezes/rales/rhonchi. Gastrointestinal: Soft and nontender. No distention. Musculoskeletal: Nontender with normal range of motion in all extremities.  Neurologic:  Normal gait without ataxia. Normal speech and language. No gross focal neurologic deficits are appreciated. Skin:  Skin is warm, dry and intact. No rash noted.  RLE with hypertrophic, flaking skin.  Patient with a stable chronic wound to the lateral aspect of the lower leg without any purulence, drainage, or dehiscence.  Patient is tender to the medial aspect of the right lower leg above the ankle.  There is an area of fluctuance appreciated, but no obvious pointing, erythema, edema, induration, punctum, or spontaneous drainage. ____________________________________________   LABS (pertinent positives/negatives) Labs Reviewed - No data to display Refused lab draw on this visit. ____________________________________________  PROCEDURES  Clindamycin 300 mg PO Norco 5-325 mg  PO  Procedures ____________________________________________  INITIAL IMPRESSION / ASSESSMENT AND PLAN / ED COURSE  Patient with ED evaluation of chronic leg wound on the right.  Patient with history of diabetes as well as chronic wounds, presents for what he assumes an abscess.  Given the patient's brittle diabetic status, and his refusal at this point to allow repeat blood draw, we decided against a local I&D, instead opting to treat the patient empirically with a course of clindamycin.  Patient did allow me to secure an appointment with him on Friday, in 2 days with wound care center.  They will further evaluate the patient's right lower extremity and provide continuity of care.  Patient is stable at this time without any signs of acute sepsis.  He is discharged with instructions to follow-up in 2 days as directed return to the ED as necessary.  Jeremy [name] was evaluated in Emergency Department on 09/19/2019 for the symptoms described in the history of present illness. He was evaluated in the  context of the global COVID-19 pandemic, which necessitated consideration that the patient might be at risk for infection with the SARS-CoV-2 virus that causes COVID-19. Institutional protocols and algorithms that pertain to the evaluation of patients at risk for COVID-19 are in a state of rapid change based on information released by regulatory bodies including the CDC and federal and state organizations. These policies and algorithms were followed during the patient's care in the ED.  I reviewed the patient's prescription history over the last 12 months in the multi-state controlled substances database(s) that includes [place], [place], [place], [place], [place], [place], [place], [place], [place], [place], [place], [place], [place], and [place].  Results were notable for no current RX.  ____________________________________________  FINAL CLINICAL IMPRESSION(S) / ED  DIAGNOSES  Final diagnoses:  Cellulitis of right lower extremity  Abscess      Carmie End, Dannielle Karvonen, PA-C 09/19/19 1729    Lilia Pro., MD 09/19/19 2132

## 2019-09-19 NOTE — Discharge Instructions (Signed)
Report to the [facility]  Friday, April 9th at 2 pm Bring your insurance card - Bring your medication list - Wear your mask

## 2019-09-19 NOTE — ED Triage Notes (Signed)
Pt c/o swelling and redness to the medial ankle area for the past 2 days and "need it lanced".Marland Kitchen

## 2019-09-21 ENCOUNTER — Other Ambulatory Visit: Payer: Self-pay

## 2019-09-21 ENCOUNTER — Encounter: Payer: Medicaid Other | Attending: Physician Assistant | Admitting: Physician Assistant

## 2019-09-21 NOTE — Progress Notes (Signed)
LATHYN, GRIGGS ([id number]) Visit Report for 09/21/2019 Arrival Information Details Patient Name: Jeremy [name], Jeremy [name] Date of Service: 09/21/2019 2:15 PM Medical Record Number: [id number] Patient Account Number: [id number] Date of Birth/Sex: Oct 24, 1955 (63 y.o. M) Treating RN: Curtis Sites Primary Care Nyiah Pianka: Vincent Gros Other Clinician: Referring Etheline Geppert: Vincent Gros Treating Carmen Tolliver/Extender: Linwood Dibbles, HOYT Weeks in Treatment: 0 Visit Information Patient Arrived: Ambulatory Arrival Time: 13:48 Accompanied By: self Transfer Assistance: None Patient Identification Verified: Yes Secondary Verification Process Completed: Yes Electronic Signature(s) Signed: 09/21/2019 3:13:01 PM By: Dayton Martes RCP, RRT, CHT Entered By: Dayton Martes on 09/21/2019 13:49:10 [name] ([id number]) -------------------------------------------------------------------------------- Pain Assessment Details Patient Name: [name] Date of Service: 09/21/2019 2:15 PM Medical Record Number: [id number] Patient Account Number: [id number] Date of Birth/Sex: 1955/07/09 (63 y.o. M) Treating RN: Curtis Sites Primary Care Sandeep Radell: Vincent Gros Other Clinician: Referring Aalyah Mansouri: Vincent Gros Treating Rally Ouch/Extender: STONE III, HOYT Weeks in Treatment: 0 Active Problems Location of Pain Severity and Description of Pain Patient Has Paino Yes Site Locations Rate the pain. Current Pain Level: 10 Pain Management and Medication Current Pain Management: Electronic Signature(s) Signed: 09/21/2019 3:13:01 PM By: Dayton Martes RCP, RRT, CHT Signed: 09/21/2019 3:27:22 PM By: Curtis Sites Entered By: Dayton Martes on 09/21/2019 13:49:35 [name] ([id number]) -------------------------------------------------------------------------------- Vitals Details Patient Name: [name] Date of Service: 09/21/2019 2:15  PM Medical Record Number: [id number] Patient Account Number: [id number] Date of Birth/Sex: 1955-10-19 (63 y.o. M) Treating RN: Curtis Sites Primary Care Zakara Parkey: Vincent Gros Other Clinician: Referring Roshana Shuffield: Vincent Gros Treating Reham Slabaugh/Extender: STONE III, HOYT Weeks in Treatment: 0 Vital Signs Time Taken: 13:50 Temperature (F): 99.0 Height (in): 71 Pulse (bpm): 72 Source: Stated Respiratory Rate (breaths/min): 18 Weight (lbs): 240 Blood Pressure (mmHg): 133/64 Source: Stated Reference Range: 80 - 120 mg / dl Body Mass Index (BMI): 33.5 Electronic Signature(s) Signed: 09/21/2019 3:13:01 PM By: Dayton Martes RCP, RRT, CHT Entered By: Dayton Martes on 09/21/2019 13:53:01

## 2019-10-04 ENCOUNTER — Other Ambulatory Visit: Payer: Self-pay | Admitting: Podiatry

## 2019-10-04 ENCOUNTER — Other Ambulatory Visit (HOSPITAL_COMMUNITY): Payer: Self-pay | Admitting: Podiatry

## 2019-10-04 DIAGNOSIS — L97319 Non-pressure chronic ulcer of right ankle with unspecified severity: Secondary | ICD-10-CM

## 2019-10-04 DIAGNOSIS — I83013 Varicose veins of right lower extremity with ulcer of ankle: Secondary | ICD-10-CM

## 2019-10-08 ENCOUNTER — Ambulatory Visit
Admission: RE | Admit: 2019-10-08 | Discharge: 2019-10-08 | Disposition: A | Discharge status: Home / Self Care | Payer: Medicaid Other | Source: Ambulatory Visit | Attending: Podiatry | Admitting: Podiatry

## 2019-10-08 ENCOUNTER — Other Ambulatory Visit: Payer: Self-pay | Admitting: Podiatry

## 2019-10-08 ENCOUNTER — Other Ambulatory Visit: Payer: Self-pay

## 2019-10-08 DIAGNOSIS — L97319 Non-pressure chronic ulcer of right ankle with unspecified severity: Secondary | ICD-10-CM | POA: Diagnosis present

## 2019-10-08 DIAGNOSIS — I83013 Varicose veins of right lower extremity with ulcer of ankle: Secondary | ICD-10-CM

## 2019-10-17 ENCOUNTER — Ambulatory Visit: Payer: Medicaid Other

## 2019-10-18 ENCOUNTER — Encounter: Payer: Self-pay | Admitting: Infectious Diseases

## 2019-10-18 ENCOUNTER — Ambulatory Visit: Payer: Medicaid Other | Admitting: Infectious Diseases

## 2019-10-18 ENCOUNTER — Ambulatory Visit: Payer: Medicaid Other | Attending: Infectious Diseases | Admitting: Infectious Diseases

## 2019-10-18 ENCOUNTER — Other Ambulatory Visit: Payer: Self-pay

## 2019-10-18 VITALS — BP 127/83 | HR 73 | Temp 98.1°F | Resp 16 | Ht 71.0 in | Wt 240.0 lb

## 2019-10-18 DIAGNOSIS — B9562 Methicillin resistant Staphylococcus aureus infection as the cause of diseases classified elsewhere: Secondary | ICD-10-CM | POA: Diagnosis not present

## 2019-10-18 DIAGNOSIS — Z7901 Long term (current) use of anticoagulants: Secondary | ICD-10-CM | POA: Insufficient documentation

## 2019-10-18 DIAGNOSIS — E118 Type 2 diabetes mellitus with unspecified complications: Secondary | ICD-10-CM | POA: Diagnosis not present

## 2019-10-18 DIAGNOSIS — Z7984 Long term (current) use of oral hypoglycemic drugs: Secondary | ICD-10-CM | POA: Insufficient documentation

## 2019-10-18 DIAGNOSIS — I83013 Varicose veins of right lower extremity with ulcer of ankle: Secondary | ICD-10-CM

## 2019-10-18 DIAGNOSIS — L97316 Non-pressure chronic ulcer of right ankle with bone involvement without evidence of necrosis: Secondary | ICD-10-CM

## 2019-10-18 DIAGNOSIS — Z79899 Other long term (current) drug therapy: Secondary | ICD-10-CM | POA: Diagnosis not present

## 2019-10-18 DIAGNOSIS — I10 Essential (primary) hypertension: Secondary | ICD-10-CM | POA: Diagnosis not present

## 2019-10-18 DIAGNOSIS — I2510 Atherosclerotic heart disease of native coronary artery without angina pectoris: Secondary | ICD-10-CM | POA: Diagnosis not present

## 2019-10-18 DIAGNOSIS — Z951 Presence of aortocoronary bypass graft: Secondary | ICD-10-CM | POA: Insufficient documentation

## 2019-10-18 DIAGNOSIS — F17210 Nicotine dependence, cigarettes, uncomplicated: Secondary | ICD-10-CM | POA: Insufficient documentation

## 2019-10-18 DIAGNOSIS — M86671 Other chronic osteomyelitis, right ankle and foot: Secondary | ICD-10-CM | POA: Insufficient documentation

## 2019-10-18 DIAGNOSIS — A4902 Methicillin resistant Staphylococcus aureus infection, unspecified site: Secondary | ICD-10-CM

## 2019-10-18 MED ORDER — LINEZOLID 600 MG PO TABS: 600.0000 mg | ORAL_TABLET | Freq: Two times a day (BID) | ORAL | 0 refills | Status: DC

## 2019-10-18 NOTE — Progress Notes (Signed)
NAME: Jeremy [name]  DOB: 1956/02/12  MRN: [id number]  Date/Time: 10/18/2019 9:50 AM  REQUESTING PROVIDER: Dr.Baker Subjective:  REASON FOR CONSULT: MRSA infection rt foot ulcer/OM ? Jeremy [name] is a 64 y.o. male with a history of diabetes mellitus, coronary artery disease, status post CABG, right ankle fracture with ORIF many years ago, is referred to me for an ulcer on the medial aspect of his right ankle for the past many weeks. Patient is followed with podiatrist and has had a an ulcer on the lateral aspect of the foot at the site of the oral for many years now.  He has been followed by Dr. Elvina Mattes. But recently a few weeks ago he developed a blister on the medial aspect of the foot which later became an ulcer.  He has been seen by Dr. Elvina Mattes and a culture was MRSA.  He is currently on doxycycline which he has been taking for the past 2 weeks.  He had a MRI done on 10/08/2019 and that shows soft tissue ulceration along the medial lower leg with sinus tract extending to the medial tibia.  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  Prior ORIF of the distal fibula hardware loosening and inferior migration is better appreciated on prior x-rays.  He was of asked to undergo a BKA but he refused. He is referred to me for management of the wound.  He has no fever or chills. He lives with his girlfriend He says his sugar is well controlled He has had a chronic itchy rash on his torso and the legs and he is being treated as scabies many times. He applies neomycin to these open wounds.   Past Medical History:  Diagnosis Date  . Coronary artery disease   . Diabetes mellitus without complication ([place])   . Hypertension   . S/P CABG x 1     Past Surgical History:  Procedure Laterality Date  . ANKLE ARTHROSCOPY W/ OPEN REPAIR Right   . CORONARY ARTERY BYPASS GRAFT      Social History   Socioeconomic History  . Marital status:  Married    Spouse name: Not on file  . Number of children: Not on file  . Years of education: Not on file  . Highest education level: Not on file  Occupational History  . Not on file  Tobacco Use  . Smoking status: Current Some Day Smoker    Packs/day: 0.50    Types: Cigarettes  . Smokeless tobacco: Former Network engineer and Sexual Activity  . Alcohol use: Not Currently  . Drug use: Yes    Types: Marijuana  . Sexual activity: Not on file  Other Topics Concern  . Not on file  Social History Narrative  . Not on file   Social Determinants of Health   Financial Resource Strain:   . Difficulty of Paying Living Expenses:   Food Insecurity:   . Worried About Charity fundraiser in the Last Year:   . Arboriculturist in the Last Year:   Transportation Needs:   . Film/video editor (Medical):   Marland Kitchen Lack of Transportation (Non-Medical):   Physical Activity:   . Days of Exercise per Week:   . Minutes of Exercise per Session:   Stress:   . Feeling of Stress :   Social Connections:   . Frequency of Communication with Friends and Family:   . Frequency of Social Gatherings with Friends and Family:   .  Attends Religious Services:   . Active Member of Clubs or Organizations:   . Attends Archivist Meetings:   Marland Kitchen Marital Status:   Intimate Partner Violence:   . Fear of Current or Ex-Partner:   . Emotionally Abused:   Marland Kitchen Physically Abused:   . Sexually Abused:     Family History  Problem Relation Age of Onset  . Heart attack Father    Allergies  Allergen Reactions  . Amoxicillin Swelling  . Keflex [Cephalexin] Rash   ? Current Outpatient Medications  Medication Sig Dispense Refill  . acetaminophen (TYLENOL) 325 MG tablet Take 2 tablets (650 mg total) by mouth every 6 (six) hours as needed for mild pain. 30 tablet 1  . blood glucose meter kit and supplies KIT Dispense based on patient and insurance preference. Use up to four times daily as directed. (FOR ICD-9  250.00, 250.01). 1 each 0  . doxycycline (VIBRAMYCIN) 100 MG capsule Take 100 mg by mouth 2 (two) times daily.    Marland Kitchen glimepiride (AMARYL) 2 MG tablet Take 1 tablet (2 mg total) by mouth daily with breakfast. 30 tablet 1  . HYDROcodone-acetaminophen (NORCO) 7.5-325 MG tablet Take 1 tablet by mouth every 6 (six) hours as needed for moderate pain.    . hydrOXYzine (ATARAX/VISTARIL) 25 MG tablet Take 25 mg by mouth 3 (three) times daily as needed for itching.    . losartan (COZAAR) 50 MG tablet Take 1 tablet (50 mg total) by mouth daily. 30 tablet 1  . metFORMIN (GLUCOPHAGE) 1000 MG tablet Take 1 tablet (1,000 mg total) by mouth 2 (two) times daily with a meal. 60 tablet 1  . permethrin (ELIMITE) 5 % cream Apply 1 application topically once. Apply to soles of feet. Rinse after 8 hours and apply again x one week.    . sitaGLIPtin (JANUVIA) 100 MG tablet Take 1 tablet (100 mg total) by mouth daily. 30 tablet 1   No current facility-administered medications for this visit.     Abtx:  Anti-infectives (From admission, onward)   None      REVIEW OF SYSTEMS:  Const: negative fever, negative chills, negative weight loss Eyes: negative diplopia or visual changes, negative eye pain ENT: negative coryza, negative sore throat Resp: negative cough, hemoptysis, dyspnea Cards: negative for chest pain, palpitations, lower extremity edema GU: negative for frequency, dysuria and hematuria GI: Negative for abdominal pain, diarrhea, bleeding, constipation Skin:  rash and pruritus Heme: negative for easy bruising and gum/nose bleeding MS: Right foot pain Neurolo:negative for headaches, dizziness, vertigo, memory problems  Psych: negative for feelings of anxiety, depression  Endocrine:  diabetes Allergy/Immunology-Keflex amoxicillin Objective:  VITALS:  BP 127/83   Pulse 73   Temp 98.1 F (36.7 C)   Resp 16   Ht 5' 11" (1.803 m)   Wt 240 lb (108.9 kg)   SpO2 97%   BMI 33.47 kg/m  PHYSICAL EXAM: In  wheelchair and hence examination is limited General: Alert, cooperative, no distress, appears stated age.  Head: Normocephalic, without obvious abnormality, atraumatic. Eyes: Conjunctivae clear, anicteric sclerae. Pupils are equal ENT Nares normal. No drainage or sinus tenderness. Poor dentition Neck: Supple, symmetrical, no adenopathy, thyroid: non tender no carotid bruit and no JVD. Lungs: Clear to auscultation bilaterally. No Wheezing or Rhonchi. No rales. Heart: Regular rate and rhythm, no murmur, rub or gallop. Sternal scar Abdomen: Did not examine Extremities: Right leg has every pain rash and on the medial aspect of the leg there is an  ulcer with granulation tissue.      Skin: Scaly pruritic hyperpigmented rash over the chest Lymph: Cervical, supraclavicular normal. Neurologic: Grossly non-focal Pertinent Labs Lab Results CBC    Component Value Date/Time   WBC 9.3 09/02/2019 1534   RBC 4.06 (L) 09/02/2019 1534   HGB 12.7 (L) 09/02/2019 1534   HGB 13.4 10/02/2011 0842   HCT 39.0 09/02/2019 1534   HCT 40.8 10/02/2011 0842   PLT 608 (H) 09/02/2019 1534   PLT 262 10/02/2011 0842   MCV 96.1 09/02/2019 1534   MCV 95 10/02/2011 0842   MCH 31.3 09/02/2019 1534   MCHC 32.6 09/02/2019 1534   RDW 12.0 09/02/2019 1534   RDW 14.4 10/02/2011 0842   LYMPHSABS 1.6 09/02/2019 1534   MONOABS 0.5 09/02/2019 1534   EOSABS 0.2 09/02/2019 1534   BASOSABS 0.1 09/02/2019 1534    CMP Latest Ref Rng & Units 09/02/2019 08/21/2019 08/20/2019  Glucose 70 - 99 mg/dL 191(H) 229(H) 340(H)  BUN 8 - 23 mg/dL _0 Creatinine 0.61 - 1.24 mg/dL 1.21 0.80 0.91  Sodium 135 - 145 mmol/L 137 133(L) 128(L)  Potassium 3.5 - 5.1 mmol/L 4.5 3.5 3.9  Chloride 98 - 111 mmol/L 103 105 101  CO2 22 - 32 mmol/L 24 18(L) 19(L)  Calcium 8.9 - 10.3 mg/dL 9.8 8.1(L) 8.4(L)  Total Protein 6.5 - 8.1 g/dL - - -  Total Bilirubin 0.3 - 1.2 mg/dL - - -  Alkaline Phos 38 - 126 U/L - - -  AST 15 - 41 U/L - - -    ALT 0 - 44 U/L - - -      Microbiology: [facility] records reviewed 10/03/2019: Cultures MRSA. IMAGING RESULTS:  MRI shows prior ORIF of the distal fibula with hardware loosening and inferior migration.  Soft tissue ulceration along the medial lower leg with sinus tract extending to the medial tibia.  Associated tibia osteomyelitis. MRI ankle and foot from 10/08/2019 reviewed personally Xray from 10/22/18    I have personally reviewed the films ? Impression/Recommendation ? History of right ankle fracture with ORIF.  Loosening of hardware.  Venous stasis with ulcer on the medial aspect of the right foot. I did not probe the wound as he did not allow me to.  But as per the MRI there is osteomyelitis of the underlying tibia. MRSA infection of the wound. Patient has been on doxycycline for the past 3 weeks. We will change it into linezolid 600 mg twice a day for 2 weeks.  DC doxycycline. Patient is refusing IV antibiotics as well as BKA which was offered to him. Not sure whether the hardware is infected.  It is in the lateral aspect.  But there is loosening definitely.  Pruritic rash.  On the right leg it is wet and looks like neomycin allergy. On the torso it is dry and scaly and could also be secondary to neomycin allergy. Asked him to not put neomycin on the leg or any wounds.  As it promotes an allergic reaction. I I doubt this is scabies as it is not responded to permethrin and is not typical of scabies lesions.  It spares  interdigital clefts. ? Diabetes mellitus.  On Metformin, Amaryl, Januvia.  Hypertension on losartan  History of CAD with CABG   Explained the side effects of linezolid and foods and medications to avoid.  I gave him literature to read.  Then I found out that he cannot read.  So my CMA spoke to  his girlfriend who was waiting in the car and explained the side effects and also gave her literature and material for to her. We will see him back in 2 weeks for  ESR, CRP, CBC, CMP. Inform Dr. Luana Shu.  ? ___________________________________________________ Discussed with patient, requesting provider Note:  This document was prepared using Dragon voice recognition software and may include unintentional dictation errors.

## 2019-10-18 NOTE — Patient Instructions (Addendum)
You are here for an infection in the rt foot- ulcer on the median aspect of the rt foot- you have MRSA in the ulcer- You are on doxycycline - will discontinue that and change to linezolid 680m PO BID for 2 weeks. Will see you in 2 Weeks - labs  ESR/CBC/CMP/CRP in 2 weeks Please avoid foods line red wine, fermented cheese, vinegar,  1 cup of coffee is okay but not more than that. Do not put neomycin on the wound as it is causing a rash on your leg and body- an allergic reaction

## 2019-10-23 ENCOUNTER — Ambulatory Visit: Payer: Medicaid Other | Admitting: Infectious Diseases

## 2019-11-01 ENCOUNTER — Ambulatory Visit: Payer: Medicaid Other | Admitting: Infectious Diseases
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
## 2019-11-26 ENCOUNTER — Ambulatory Visit: Payer: Medicaid Other | Attending: Pain Medicine | Admitting: Pain Medicine

## 2019-11-26 ENCOUNTER — Other Ambulatory Visit: Payer: Self-pay

## 2019-11-26 DIAGNOSIS — G8929 Other chronic pain: Secondary | ICD-10-CM

## 2019-11-26 DIAGNOSIS — R52 Pain, unspecified: Secondary | ICD-10-CM

## 2019-11-26 DIAGNOSIS — F1991 Other psychoactive substance use, unspecified, in remission: Secondary | ICD-10-CM | POA: Insufficient documentation

## 2019-11-26 DIAGNOSIS — Z87898 Personal history of other specified conditions: Secondary | ICD-10-CM | POA: Diagnosis not present

## 2019-11-26 DIAGNOSIS — M25571 Pain in right ankle and joints of right foot: Secondary | ICD-10-CM | POA: Insufficient documentation

## 2019-11-26 DIAGNOSIS — Z79899 Other long term (current) drug therapy: Secondary | ICD-10-CM | POA: Insufficient documentation

## 2019-11-26 DIAGNOSIS — F1291 Cannabis use, unspecified, in remission: Secondary | ICD-10-CM | POA: Insufficient documentation

## 2019-11-26 DIAGNOSIS — F1491 Cocaine use, unspecified, in remission: Secondary | ICD-10-CM

## 2019-11-26 NOTE — Progress Notes (Signed)
Patient came to appointment and reports no pain. Pain was in the right ankle area and pain did radiate to knee when walking. Pain was due to ulceration d/t history of chronic osteomyelitis of the right ankle. Patient reports that wound in his ankle has healed and wearing leg compressions which have helped. Currently states he is without pain and not taking pain medication. Discussed with Dr. Shireen Quan and discharge patient home. Patient agreed with plan and states he will find a PCP for follow-ups.

## 2019-11-27 ENCOUNTER — Ambulatory Visit: Payer: Medicaid Other | Admitting: Infectious Diseases

## 2020-08-18 ENCOUNTER — Ambulatory Visit
Admission: RE | Admit: 2020-08-18 | Discharge: 2020-08-18 | Disposition: A | Discharge status: Home / Self Care | Payer: Medicaid Other | Source: Ambulatory Visit | Attending: Physician Assistant | Admitting: Physician Assistant

## 2020-08-18 ENCOUNTER — Encounter: Payer: Medicaid Other | Attending: Physician Assistant | Admitting: Physician Assistant

## 2020-08-18 ENCOUNTER — Other Ambulatory Visit
Admission: RE | Admit: 2020-08-18 | Discharge: 2020-08-18 | Disposition: A | Discharge status: Home / Self Care | Payer: Medicaid Other | Source: Ambulatory Visit | Attending: Physician Assistant | Admitting: Physician Assistant

## 2020-08-18 ENCOUNTER — Other Ambulatory Visit: Payer: Self-pay | Admitting: Physician Assistant

## 2020-08-18 ENCOUNTER — Other Ambulatory Visit: Payer: Self-pay

## 2020-08-18 ENCOUNTER — Ambulatory Visit
Admission: RE | Admit: 2020-08-18 | Discharge: 2020-08-18 | Disposition: A | Discharge status: Home / Self Care | Payer: Medicaid Other | Attending: Physician Assistant | Admitting: Physician Assistant

## 2020-08-18 DIAGNOSIS — E11622 Type 2 diabetes mellitus with other skin ulcer: Secondary | ICD-10-CM | POA: Insufficient documentation

## 2020-08-18 DIAGNOSIS — I10 Essential (primary) hypertension: Secondary | ICD-10-CM | POA: Insufficient documentation

## 2020-08-18 DIAGNOSIS — L089 Local infection of the skin and subcutaneous tissue, unspecified: Secondary | ICD-10-CM | POA: Insufficient documentation

## 2020-08-18 DIAGNOSIS — M869 Osteomyelitis, unspecified: Secondary | ICD-10-CM

## 2020-08-18 DIAGNOSIS — I2510 Atherosclerotic heart disease of native coronary artery without angina pectoris: Secondary | ICD-10-CM | POA: Insufficient documentation

## 2020-08-18 DIAGNOSIS — L03115 Cellulitis of right lower limb: Secondary | ICD-10-CM | POA: Insufficient documentation

## 2020-08-18 DIAGNOSIS — L97316 Non-pressure chronic ulcer of right ankle with bone involvement without evidence of necrosis: Secondary | ICD-10-CM | POA: Insufficient documentation

## 2020-08-20 LAB — AEROBIC CULTURE W GRAM STAIN (SUPERFICIAL SPECIMEN)

## 2020-08-20 NOTE — Progress Notes (Addendum)
ZAMARIAN, SCARANO ([id number]) Visit Report for 08/18/2020 Chief Complaint Document Details Patient Name: Jeremy [name], Jeremy [name] Date of Service: 08/18/2020 10:45 AM Medical Record Number: [id number] Patient Account Number: [id number] Date of Birth/Sex: 1955-09-29 (64 y.o. M) Treating RN: Yevonne Pax Primary Care Provider: Vincent Gros Other Clinician: Lolita Cram Referring Provider: Vincent Gros Treating Provider/Extender: Rowan Blase in Treatment: 0 Information Obtained from: Patient Chief Complaint Right ankle ulcer Electronic Signature(s) Signed: 08/18/2020 11:30:34 AM By: Lenda Kelp PA-C Entered By: Lenda Kelp on 08/18/2020 11:30:34 Jeremy [name] ([id number]) -------------------------------------------------------------------------------- HPI Details Patient Name: Jeremy [name] Date of Service: 08/18/2020 10:45 AM Medical Record Number: [id number] Patient Account Number: [id number] Date of Birth/Sex: Nov 16, 1955 (64 y.o. M) Treating RN: Yevonne Pax Primary Care Provider: Vincent Gros Other Clinician: Lolita Cram Referring Provider: Vincent Gros Treating Provider/Extender: Rowan Blase in Treatment: 0 History of Present Illness HPI Description: 08/18/2020 upon evaluation today patient presents for initial inspection here in our clinic concerning issues has been having with his right lateral ankle and this has been present for at least a year he tells me. He is a patient of Dr. Orland Jarred. Subsequently Dr. Orland Jarred has since retired hence the patient look this up and is coming here for wound care to see if we can help him out. He did have a screw pushed out of this location and since that time tells me that the screw was removed but nonetheless the hole has remained. Fortunately there does not appear to be any signs of active infection systemically at this point though it does raise the question as to whether or not this might be a  bone/hardware infection being that this has been open for so long. The patient does have a history of diabetes mellitus type 2, coronary artery disease, hypertension, and unfortunately does not seem to be doing as great as I would like to see him regard to his left ankle. Electronic Signature(s) Signed: 08/18/2020 11:46:04 AM By: Lenda Kelp PA-C Entered By: Lenda Kelp on 08/18/2020 11:46:04 Jeremy [name], Jeremy [name] ([id number]) -------------------------------------------------------------------------------- Physical Exam Details Patient Name: Jeremy [name] Date of Service: 08/18/2020 10:45 AM Medical Record Number: [id number] Patient Account Number: [id number] Date of Birth/Sex: 17-Aug-1955 (64 y.o. M) Treating RN: Yevonne Pax Primary Care Provider: Vincent Gros Other Clinician: Lolita Cram Referring Provider: Vincent Gros Treating Provider/Extender: Rowan Blase in Treatment: 0 Constitutional patient is hypertensive.. pulse regular and within target range for patient.Marland Kitchen respirations regular, non-labored and within target range for patient.Marland Kitchen temperature within target range for patient.. Well-nourished and well-hydrated in no acute distress. Eyes conjunctiva clear no eyelid edema noted. pupils equal round and reactive to light and accommodation. Ears, Nose, Mouth, and Throat no gross abnormality of ear auricles or external auditory canals. normal hearing noted during conversation. mucus membranes moist. Respiratory normal breathing without difficulty. Cardiovascular 2+ dorsalis pedis/posterior tibialis pulses. 1+ pitting edema of the bilateral lower extremities. Psychiatric this patient is able to make decisions and demonstrates good insight into disease process. Alert and Oriented x 3. pleasant and cooperative. Notes Upon inspection patient's wound bed actually showed signs of a deeper wound at this point. There does appear to be some necrotic tissue noted I was able to  mechanically breathe this way with saline and gauze and a sterile Q-tip. He tolerated that today without complication and post debridement the wound bed appears to be doing much better. With that being said unfortunately I was able to probe down to bone and/or hardware noted in the base of the  wound on the lateral ankle. Subsequently this does not bode well for the possibility of osteomyelitis. I think that is definitely something that we could be looking at here. His ABIs were okay at 0.88. The patient does have some edema this is not too significant but he has excellent pulses which is great. Electronic Signature(s) Signed: 08/18/2020 11:46:55 AM By: Lenda Kelp PA-C Entered By: Lenda Kelp on 08/18/2020 11:46:54 Jeremy [name] ([id number]) -------------------------------------------------------------------------------- Physician Orders Details Patient Name: Jeremy [name] Date of Service: 08/18/2020 10:45 AM Medical Record Number: [id number] Patient Account Number: [id number] Date of Birth/Sex: [birth year] (64 y.o. M) Treating RN: Huel Coventry Primary Care Provider: Vincent Gros Other Clinician: Lolita Cram Referring Provider: Vincent Gros Treating Provider/Extender: Rowan Blase in Treatment: 0 Verbal / Phone Orders: No Diagnosis Coding ICD-10 Coding Code Description L03.115 Cellulitis of right lower limb L97.312 Non-pressure chronic ulcer of right ankle with fat layer exposed E11.622 Type 2 diabetes mellitus with other skin ulcer I10 Essential (primary) hypertension I25.10 Atherosclerotic heart disease of native coronary artery without angina pectoris Follow-up Appointments Wound #1 Right,Lateral Ankle o Return Appointment in 1 week. Bathing/ Shower/ Hygiene Wound #1 Right,Lateral Ankle o Clean wound with Normal Saline or wound cleanser. Wound Treatment Wound #1 - Ankle Wound Laterality: Right, Lateral Primary Dressing: Iodoform gauze packing strip 1/2  (in) 1 x Per Day/15 Days Discharge Instructions: Apply Iodoform Packing Strip as instructed. Secondary Dressing: Bordered Gauze Sterile-HBD 4x4 (in/in) 1 x Per Day/15 Days Discharge Instructions: Cover wound with Bordered Guaze Sterile as directed Laboratory o Bacteria identified in Wound by Culture (MICRO) - Right Ankle oooo LOINC Code: 6462-6 oooo Convenience Name: Wound culture routine Radiology o X-ray, ankle - Right Ankle Patient Medications Allergies: No Known Allergies Notifications Medication Indication Start End Augmentin 08/21/2020 DOSE 1 - oral 875 mg-125 mg tablet - 1 tablet oral taken 2 times per day for 30 days Electronic Signature(s) Signed: 08/21/2020 7:58:49 AM By: Lenda Kelp PA-C Previous Signature: 08/18/2020 12:29:11 PM Version By: Lenda Kelp PA-C Previous Signature: 08/18/2020 1:50:11 PM Version By: Elliot Gurney, BSN, RN, CWS, Kim RN, BSN Entered By: Lenda Kelp on 08/21/2020 07:58:47 Jeremy [name], Jeremy [name] ([id number]) Jeremy [name], Jeremy [name] ([id number]) -------------------------------------------------------------------------------- Problem List Details Patient Name: Jeremy [name] Date of Service: 08/18/2020 10:45 AM Medical Record Number: [id number] Patient Account Number: [id number] Date of Birth/Sex: Oct 02, 1955 (64 y.o. M) Treating RN: Yevonne Pax Primary Care Provider: Vincent Gros Other Clinician: Lolita Cram Referring Provider: Vincent Gros Treating Provider/Extender: Rowan Blase in Treatment: 0 Active Problems ICD-10 Encounter Code Description Active Date MDM Diagnosis L03.115 Cellulitis of right lower limb 08/18/2020 No Yes L97.312 Non-pressure chronic ulcer of right ankle with fat layer exposed 08/18/2020 No Yes E11.622 Type 2 diabetes mellitus with other skin ulcer 08/18/2020 No Yes I10 Essential (primary) hypertension 08/18/2020 No Yes I25.10 Atherosclerotic heart disease of native coronary artery without angina 08/18/2020 No  Yes pectoris Inactive Problems Resolved Problems Electronic Signature(s) Signed: 08/18/2020 11:30:19 AM By: Lenda Kelp PA-C Entered By: Lenda Kelp on 08/18/2020 11:30:18 Jeremy [name] ([id number]) -------------------------------------------------------------------------------- Progress Note Details Patient Name: Jeremy [name] Date of Service: 08/18/2020 10:45 AM Medical Record Number: [id number] Patient Account Number: [id number] Date of Birth/Sex: 09-Jul-1955 (64 y.o. M) Treating RN: Yevonne Pax Primary Care Provider: Vincent Gros Other Clinician: Lolita Cram Referring Provider: Vincent Gros Treating Provider/Extender: Rowan Blase in Treatment: 0 Subjective Chief Complaint Information obtained from Patient Right ankle ulcer History of Present Illness (HPI) 08/18/2020 upon evaluation today patient presents for initial  inspection here in our clinic concerning issues has been having with his right lateral ankle and this has been present for at least a year he tells me. He is a patient of Dr. Orland Jarred. Subsequently Dr. Orland Jarred has since retired hence the patient look this up and is coming here for wound care to see if we can help him out. He did have a screw pushed out of this location and since that time tells me that the screw was removed but nonetheless the hole has remained. Fortunately there does not appear to be any signs of active infection systemically at this point though it does raise the question as to whether or not this might be a bone/hardware infection being that this has been open for so long. The patient does have a history of diabetes mellitus type 2, coronary artery disease, hypertension, and unfortunately does not seem to be doing as great as I would like to see him regard to his left ankle. Patient History Information obtained from Patient. Allergies No Known Allergies Social History Current every day smoker - 40 years, Alcohol Use -  Moderate, Drug Use - Current History, Caffeine Use - Daily. Medical History Eyes Denies history of Cataracts, Glaucoma, Optic Neuritis Ear/Nose/Mouth/Throat Denies history of Chronic sinus problems/congestion, Middle ear problems Hematologic/Lymphatic Denies history of Anemia, Hemophilia, Human Immunodeficiency Virus, Lymphedema, Sickle Cell Disease Respiratory Denies history of Aspiration, Asthma, Chronic Obstructive Pulmonary Disease (COPD), Pneumothorax, Sleep Apnea, Tuberculosis Cardiovascular Patient has history of Coronary Artery Disease, Hypertension Denies history of Angina, Arrhythmia, Congestive Heart Failure, Deep Vein Thrombosis, Hypotension, Myocardial Infarction, Peripheral Arterial Disease, Peripheral Venous Disease, Phlebitis, Vasculitis Gastrointestinal Denies history of Cirrhosis , Colitis, Crohn s, Hepatitis A, Hepatitis B, Hepatitis C Endocrine Patient has history of Type II Diabetes Genitourinary Denies history of End Stage Renal Disease Immunological Denies history of Lupus Erythematosus, Raynaud s, Scleroderma Integumentary (Skin) Denies history of History of Burn, History of pressure wounds Musculoskeletal Patient has history of Osteomyelitis Denies history of Gout, Rheumatoid Arthritis, Osteoarthritis Neurologic Denies history of Dementia, Neuropathy, Quadriplegia, Paraplegia, Seizure Disorder Oncologic Denies history of Received Chemotherapy, Received Radiation Psychiatric Denies history of Anorexia/bulimia, Confinement Anxiety Patient is treated with Oral Agents. Blood sugar is not tested. Medical And Surgical History Notes Cardiovascular CABGx1 Review of Systems (ROS) Constitutional Symptoms (General Health) Jeremy [name], Jeremy [name] ([id number]) Denies complaints or symptoms of Fatigue, Fever, Chills, Marked Weight Change. Eyes Denies complaints or symptoms of Dry Eyes, Vision Changes, Glasses / Contacts. Ear/Nose/Mouth/Throat Denies complaints or  symptoms of Difficult clearing ears, Sinusitis. Hematologic/Lymphatic Denies complaints or symptoms of Bleeding / Clotting Disorders, Human Immunodeficiency Virus. Respiratory Denies complaints or symptoms of Chronic or frequent coughs, Shortness of Breath. Cardiovascular Denies complaints or symptoms of Chest pain, LE edema. Gastrointestinal Denies complaints or symptoms of Frequent diarrhea, Nausea, Vomiting. Endocrine Denies complaints or symptoms of Hepatitis, Thyroid disease, Polydypsia (Excessive Thirst). Genitourinary Denies complaints or symptoms of Kidney failure/ Dialysis, Incontinence/dribbling. Immunological Denies complaints or symptoms of Hives, Itching. Integumentary (Skin) Complains or has symptoms of Wounds. Denies complaints or symptoms of Bleeding or bruising tendency, Breakdown, Swelling. Musculoskeletal Denies complaints or symptoms of Muscle Pain, Muscle Weakness. Neurologic Denies complaints or symptoms of Numbness/parasthesias, Focal/Weakness. Psychiatric Denies complaints or symptoms of Anxiety, Claustrophobia. Objective Constitutional patient is hypertensive.. pulse regular and within target range for patient.Marland Kitchen respirations regular, non-labored and within target range for patient.Marland Kitchen temperature within target range for patient.. Well-nourished and well-hydrated in no acute distress. Vitals Time Taken: 11:00 AM, Temperature: 98.1 F, Pulse: 132  bpm, Respiratory Rate: 20 breaths/min, Blood Pressure: 139/99 mmHg. Eyes conjunctiva clear no eyelid edema noted. pupils equal round and reactive to light and accommodation. Ears, Nose, Mouth, and Throat no gross abnormality of ear auricles or external auditory canals. normal hearing noted during conversation. mucus membranes moist. Respiratory normal breathing without difficulty. Cardiovascular 2+ dorsalis pedis/posterior tibialis pulses. 1+ pitting edema of the bilateral lower extremities. Psychiatric this  patient is able to make decisions and demonstrates good insight into disease process. Alert and Oriented x 3. pleasant and cooperative. General Notes: Upon inspection patient's wound bed actually showed signs of a deeper wound at this point. There does appear to be some necrotic tissue noted I was able to mechanically breathe this way with saline and gauze and a sterile Q-tip. He tolerated that today without complication and post debridement the wound bed appears to be doing much better. With that being said unfortunately I was able to probe down to bone and/or hardware noted in the base of the wound on the lateral ankle. Subsequently this does not bode well for the possibility of osteomyelitis. I think that is definitely something that we could be looking at here. His ABIs were okay at 0.88. The patient does have some edema this is not too significant but he has excellent pulses which is great. Integumentary (Hair, Skin) Wound #1 status is Open. Original cause of wound was Gradually Appeared. The date acquired was: 07/15/2020. The wound is located on the Right,Lateral Ankle. The wound measures 7cm length x 1cm width x 0.5cm depth; 5.498cm^2 area and 2.749cm^3 volume. There is bone and Fat Layer (Subcutaneous Tissue) exposed. There is no tunneling noted, however, there is undermining starting at 12:00 and ending at 12:00 with a maximum distance of 0.4cm. There is a large amount of serous drainage noted. The wound margin is flat and intact. There is no granulation within the wound bed. There is a large (67-100%) amount of necrotic tissue within the wound bed including Adherent Slough. General Notes: Probes down to bone and or hardware. Jeremy BottomROWELL, Jeremy [name] ([id number]) Assessment Active Problems ICD-10 Cellulitis of right lower limb Non-pressure chronic ulcer of right ankle with fat layer exposed Type 2 diabetes mellitus with other skin ulcer Essential (primary) hypertension Atherosclerotic heart  disease of native coronary artery without angina pectoris Plan Follow-up Appointments: Wound #1 Right,Lateral Ankle: Return Appointment in 1 week. Bathing/ Shower/ Hygiene: Wound #1 Right,Lateral Ankle: Clean wound with Normal Saline or wound cleanser. Laboratory ordered were: Wound culture routine - Right Ankle Radiology ordered were: X-ray, ankle - Right Ankle WOUND #1: - Ankle Wound Laterality: Right, Lateral Primary Dressing: Iodoform gauze packing strip 1/2 (in) 1 x Per Day/15 Days Discharge Instructions: Apply Iodoform Packing Strip as instructed. Secondary Dressing: Bordered Gauze Sterile-HBD 4x4 (in/in) 1 x Per Day/15 Days Discharge Instructions: Cover wound with Bordered Guaze Sterile as directed 1. Would recommend currently that we go ahead and initiate treatment with a iodoform gauze quarter-inch packing which should go into the lateral ankle. 2. I am also can recommend at this time that we have the patient cover this with a border foam dressing. 3. I am also can recommend a wound culture this was obtained today we will see what this shows. 4. I am also can recommend that we go ahead and initiate an order for a x-ray of the right ankle. We will see what this shows although I am concerned about the possibility of osteomyelitis/hardware infection. We will see patient back for reevaluation in 1 week  here in the clinic. If anything worsens or changes patient will contact our office for additional recommendations. Electronic Signature(s) Signed: 08/18/2020 11:47:39 AM By: Lenda Kelp PA-C Entered By: Lenda Kelp on 08/18/2020 11:47:38 Jeremy [name] ([id number]) -------------------------------------------------------------------------------- ROS/PFSH Details Patient Name: Jeremy [name] Date of Service: 08/18/2020 10:45 AM Medical Record Number: [id number] Patient Account Number: [id number] Date of Birth/Sex: 03-May-1956 (64 y.o. M) Treating RN: Yevonne Pax Primary  Care Provider: Vincent Gros Other Clinician: Lolita Cram Referring Provider: Vincent Gros Treating Provider/Extender: Rowan Blase in Treatment: 0 Information Obtained From Patient Constitutional Symptoms (General Health) Complaints and Symptoms: Negative for: Fatigue; Fever; Chills; Marked Weight Change Eyes Complaints and Symptoms: Negative for: Dry Eyes; Vision Changes; Glasses / Contacts Medical History: Negative for: Cataracts; Glaucoma; Optic Neuritis Ear/Nose/Mouth/Throat Complaints and Symptoms: Negative for: Difficult clearing ears; Sinusitis Medical History: Negative for: Chronic sinus problems/congestion; Middle ear problems Hematologic/Lymphatic Complaints and Symptoms: Negative for: Bleeding / Clotting Disorders; Human Immunodeficiency Virus Medical History: Negative for: Anemia; Hemophilia; Human Immunodeficiency Virus; Lymphedema; Sickle Cell Disease Respiratory Complaints and Symptoms: Negative for: Chronic or frequent coughs; Shortness of Breath Medical History: Negative for: Aspiration; Asthma; Chronic Obstructive Pulmonary Disease (COPD); Pneumothorax; Sleep Apnea; Tuberculosis Cardiovascular Complaints and Symptoms: Negative for: Chest pain; LE edema Medical History: Positive for: Coronary Artery Disease; Hypertension Negative for: Angina; Arrhythmia; Congestive Heart Failure; Deep Vein Thrombosis; Hypotension; Myocardial Infarction; Peripheral Arterial Disease; Peripheral Venous Disease; Phlebitis; Vasculitis Past Medical History Notes: CABGx1 Gastrointestinal Complaints and Symptoms: Negative for: Frequent diarrhea; Nausea; Vomiting Medical History: Negative for: Cirrhosis ; Colitis; Crohnos; Hepatitis A; Hepatitis B; Hepatitis C Endocrine Jeremy [name], Jeremy [name] ([id number]) Complaints and Symptoms: Negative for: Hepatitis; Thyroid disease; Polydypsia (Excessive Thirst) Medical History: Positive for: Type II Diabetes Time with diabetes:  2 years Treated with: Oral agents Blood sugar tested every day: No Genitourinary Complaints and Symptoms: Negative for: Kidney failure/ Dialysis; Incontinence/dribbling Medical History: Negative for: End Stage Renal Disease Immunological Complaints and Symptoms: Negative for: Hives; Itching Medical History: Negative for: Lupus Erythematosus; Raynaudos; Scleroderma Integumentary (Skin) Complaints and Symptoms: Positive for: Wounds Negative for: Bleeding or bruising tendency; Breakdown; Swelling Medical History: Negative for: History of Burn; History of pressure wounds Musculoskeletal Complaints and Symptoms: Negative for: Muscle Pain; Muscle Weakness Medical History: Positive for: Osteomyelitis Negative for: Gout; Rheumatoid Arthritis; Osteoarthritis Neurologic Complaints and Symptoms: Negative for: Numbness/parasthesias; Focal/Weakness Medical History: Negative for: Dementia; Neuropathy; Quadriplegia; Paraplegia; Seizure Disorder Psychiatric Complaints and Symptoms: Negative for: Anxiety; Claustrophobia Medical History: Negative for: Anorexia/bulimia; Confinement Anxiety Oncologic Medical History: Negative for: Received Chemotherapy; Received Radiation Immunizations Pneumococcal Vaccine: Received Pneumococcal Vaccination: No Implantable Devices Jeremy [name], Jeremy [name] ([id number]) None Family and Social History Current every day smoker - 40 years; Alcohol Use: Moderate; Drug Use: Current History; Caffeine Use: Daily; Financial Concerns: No; Food, Clothing or Shelter Needs: No; Support System Lacking: No; Transportation Concerns: No Electronic Signature(s) Signed: 08/18/2020 12:29:11 PM By: Lenda Kelp PA-C Signed: 08/18/2020 1:04:35 PM By: Lolita Cram Signed: 08/20/2020 12:09:52 PM By: Yevonne Pax RN Entered By: Lolita Cram on 08/18/2020 11:24:04 Jeremy [name] ([id number]) -------------------------------------------------------------------------------- SuperBill  Details Patient Name: Jeremy [name] Date of Service: 08/18/2020 Medical Record Number: [id number] Patient Account Number: [id number] Date of Birth/Sex: November 27, 1955 (64 y.o. M) Treating RN: Yevonne Pax Primary Care Provider: Vincent Gros Other Clinician: Lolita Cram Referring Provider: Vincent Gros Treating Provider/Extender: Rowan Blase in Treatment: 0 Diagnosis Coding ICD-10 Codes Code Description [id number] Cellulitis of right lower limb L97.312 Non-pressure chronic ulcer of right ankle with fat layer exposed E11.622 Type  2 diabetes mellitus with other skin ulcer I10 Essential (primary) hypertension I25.10 Atherosclerotic heart disease of native coronary artery without angina pectoris Physician Procedures CPT4 Code: [id number] Description: 99204 - WC PHYS LEVEL 4 - NEW PT Modifier: Quantity: 1 CPT4 Code: Description: ICD-10 Diagnosis Description L03.115 Cellulitis of right lower limb L97.312 Non-pressure chronic ulcer of right ankle with fat layer exposed E11.622 Type 2 diabetes mellitus with other skin ulcer I10 Essential (primary) hypertension Modifier: Quantity: Electronic Signature(s) Signed: 08/18/2020 11:48:04 AM By: Lenda Kelp PA-C Entered By: Lenda Kelp on 08/18/2020 11:48:04

## 2020-08-20 NOTE — Progress Notes (Signed)
Jeremy [name], Jeremy [name] ([id number]) Visit Report for 08/18/2020 Allergy List Details Patient Name: Jeremy [name], Jeremy [name] Date of Service: 08/18/2020 10:45 AM Medical Record Number: [id number] Patient Account Number: [id number] Date of Birth/Sex: [birth year] (65 y.o. M) Treating RN: Yevonne Pax Primary Care Ladarien Beeks: Jeremy [name] Other Clinician: Lolita Cram Referring Cathye Kreiter: Jeremy [name] Treating Adalae Baysinger/Extender: Allen [name] Weeks in Treatment: 0 Allergies Active Allergies No Known Allergies Allergy Notes Electronic Signature(s) Signed: 08/18/2020 1:04:35 PM By: Lolita Cram Entered By: Lolita Cram on 08/18/2020 10:52:43 Jeremy [name] ([id number]) -------------------------------------------------------------------------------- Arrival Information Details Patient Name: Jeremy [name] Date of Service: 08/18/2020 10:45 AM Medical Record Number: [id number] Patient Account Number: [id number] Date of Birth/Sex: 09-09-1955 (65 y.o. M) Treating RN: Yevonne Pax Primary Care Camillo Quadros: Jeremy [name] Other Clinician: Lolita Cram Referring Lavida Patch: Jeremy [name] Treating Laymon Stockert/Extender: Rowan Blase in Treatment: 0 Visit Information Patient Arrived: Ambulatory Arrival Time: 10:51 Accompanied By: self Transfer Assistance: None Patient Identification Verified: Yes Secondary Verification Process Completed: Yes Patient Has Alerts: Yes Patient Alerts: type ll diabetic Electronic Signature(s) Signed: 08/18/2020 1:04:35 PM By: Lolita Cram Entered By: Lolita Cram on 08/18/2020 11:08:56 Jeremy [name] ([id number]) -------------------------------------------------------------------------------- Lower Extremity Assessment Details Patient Name: Jeremy [name] Date of Service: 08/18/2020 10:45 AM Medical Record Number: [id number] Patient Account Number: [id number] Date of Birth/Sex: Jan 01, 1956 (65 y.o. M) Treating RN: Yevonne Pax Primary Care Ata Pecha: Jeremy [name] Other Clinician: Lolita Cram Referring Domonik Levario: Jeremy [name] Treating Mckell Riecke/Extender: Rowan Blase in Treatment: 0 Edema Assessment Assessed: [Left: No] [Right: Yes] Edema: [Left: Ye] [Right: s] Calf Left: Right: Point of Measurement: 33 cm From Medial Instep 36.6 cm Ankle Left: Right: Point of Measurement: 12 cm From Medial Instep 27 cm Vascular Assessment Pulses: Dorsalis Pedis Palpable: [Right:Yes] Doppler Audible: [Right:Yes] Posterior Tibial Palpable: [Right:No] Blood Pressure: Brachial: [Right:150] Dorsalis Pedis: 128 Ankle: Posterior Tibial: 132 Ankle Brachial Index: [Right:0.88] Electronic Signature(s) Signed: 08/18/2020 1:50:11 PM By: Elliot Gurney, BSN, RN, CWS, Kim RN, BSN Signed: 08/20/2020 12:09:52 PM By: Yevonne Pax RN Entered By: Elliot Gurney, BSN, RN, CWS, Kim on 08/18/2020 11:35:14 Jeremy [name] ([id number]) -------------------------------------------------------------------------------- Multi Wound Chart Details Patient Name: Jeremy [name] Date of Service: 08/18/2020 10:45 AM Medical Record Number: [id number] Patient Account Number: [id number] Date of Birth/Sex: November 22, 1955 (65 y.o. M) Treating RN: Huel Coventry Primary Care Grasiela Jonsson: Jeremy [name] Other Clinician: Lolita Cram Referring Rashmi Tallent: Jeremy [name] Treating Dontavis Tschantz/Extender: Rowan Blase in Treatment: 0 Vital Signs Height(in): Pulse(bpm): 132 Weight(lbs): Blood Pressure(mmHg): 139/99 Body Mass Index(BMI): Temperature(F): 98.1 Respiratory Rate(breaths/min): 20 Photos: [N/A:N/A] Wound Location: Right, Lateral Ankle N/A N/A Wounding Event: Gradually Appeared N/A N/A Primary Etiology: Infection - not elsewhere classified N/A N/A Comorbid History: Coronary Artery Disease, N/A N/A Hypertension, Type II Diabetes, Osteomyelitis Date Acquired: 07/15/2020 N/A N/A Weeks of Treatment: 0 N/A N/A Wound Status: Open N/A N/A Measurements L x W x D (cm) 7x1x0.5 N/A N/A Area  (cm) : 5.498 N/A N/A Volume (cm) : 2.749 N/A N/A % Reduction in Area: 0.00% N/A N/A % Reduction in Volume: 0.00% N/A N/A Starting Position 1 (o'clock): 12 Ending Position 1 (o'clock): 12 Maximum Distance 1 (cm): 0.4 Undermining: Yes N/A N/A Classification: Full Thickness With Exposed N/A N/A Support Structures Exudate Amount: Large N/A N/A Exudate Type: Serous N/A N/A Exudate Color: amber N/A N/A Wound Margin: Flat and Intact N/A N/A Granulation Amount: None Present (0%) N/A N/A Necrotic Amount: Large (67-100%) N/A N/A Exposed Structures: Fat Layer (Subcutaneous Tissue): N/A N/A Yes Bone: Yes Fascia: No Tendon: No Muscle: No Joint: No Assessment Notes: Probes down to bone and  or N/A N/A hardware. Treatment Notes Electronic Signature(s) Signed: 08/18/2020 1:50:11 PM By: Elliot Gurney, BSN, RN, CWS, Kim RN, BSN [place], Ben Arnold ([id number]) Entered By: Elliot Gurney, BSN, RN, CWS, Kim on 08/18/2020 11:40:10 Jeremy [name] ([id number]) -------------------------------------------------------------------------------- Multi-Disciplinary Care Plan Details Patient Name: Jeremy [name] Date of Service: 08/18/2020 10:45 AM Medical Record Number: [id number] Patient Account Number: [id number] Date of Birth/Sex: 23-Jul-1955 (65 y.o. M) Treating RN: Huel Coventry Primary Care Aneudy Champlain: Jeremy [name] Other Clinician: Lolita Cram Referring Phebe Dettmer: Jeremy [name] Treating Tremar Wickens/Extender: Rowan Blase in Treatment: 0 Active Inactive Necrotic Tissue Nursing Diagnoses: Impaired tissue integrity related to necrotic/devitalized tissue Knowledge deficit related to management of necrotic/devitalized tissue Goals: Necrotic/devitalized tissue will be minimized in the wound bed Date Initiated: 08/18/2020 Target Resolution Date: 09/01/2020 Goal Status: Active Patient/caregiver will verbalize understanding of reason and process for debridement of necrotic tissue Date Initiated: 08/18/2020 Target  Resolution Date: 09/01/2020 Goal Status: Active Interventions: Assess patient pain level pre-, during and post procedure and prior to discharge Provide education on necrotic tissue and debridement process Notes: Orientation to the Wound Care Program Nursing Diagnoses: Knowledge deficit related to the wound healing center program Goals: Patient/caregiver will verbalize understanding of the Wound Healing Center Program Date Initiated: 08/18/2020 Target Resolution Date: 09/01/2020 Goal Status: Active Interventions: Provide education on orientation to the wound center Notes: Osteomyelitis Nursing Diagnoses: Knowledge deficit related to disease process and management Potential for infection: osteomyelitis Goals: Diagnostic evaluation for osteomyelitis completed as ordered Date Initiated: 08/18/2020 Target Resolution Date: 09/01/2020 Goal Status: Active Interventions: Assess for signs and symptoms of osteomyelitis resolution every visit Treatment Activities: Systemic antibiotics : 08/18/2020 X-ray : 08/18/2020 Notes: Jeremy [name] ([id number]) Soft Tissue Infection Nursing Diagnoses: Impaired tissue integrity Knowledge deficit related to disease process and management Knowledge deficit related to home infection control: handwashing, handling of soiled dressings, supply storage Potential for infection: soft tissue Goals: Patient will remain free of wound infection Date Initiated: 08/18/2020 Target Resolution Date: 09/01/2020 Goal Status: Active Patient/caregiver will verbalize understanding of or measures to prevent infection and contamination in the home setting Date Initiated: 08/18/2020 Target Resolution Date: 09/01/2020 Goal Status: Active Interventions: Assess signs and symptoms of infection every visit Treatment Activities: Culture and sensitivity : 08/18/2020 Notes: Wound/Skin Impairment Nursing Diagnoses: Impaired tissue integrity Knowledge deficit related to smoking impact on  wound healing Goals: Patient/caregiver will verbalize understanding of skin care regimen Date Initiated: 08/18/2020 Target Resolution Date: 08/25/2020 Goal Status: Active Ulcer/skin breakdown will have a volume reduction of 30% by week 4 Date Initiated: 08/18/2020 Target Resolution Date: 09/18/2020 Goal Status: Active Interventions: Assess ulceration(s) every visit Treatment Activities: Referred to DME Marlina Cataldi for dressing supplies : 08/18/2020 Notes: Electronic Signature(s) Signed: 08/18/2020 1:50:11 PM By: Elliot Gurney, BSN, RN, CWS, Kim RN, BSN Entered By: Elliot Gurney, BSN, RN, CWS, Kim on 08/18/2020 11:39:47 Jeremy [name] ([id number]) -------------------------------------------------------------------------------- Pain Assessment Details Patient Name: Jeremy [name] Date of Service: 08/18/2020 10:45 AM Medical Record Number: [id number] Patient Account Number: [id number] Date of Birth/Sex: Apr 06, 1956 (65 y.o. M) Treating RN: Yevonne Pax Primary Care Javius Sylla: Jeremy [name] Other Clinician: Lolita Cram Referring Lorene Samaan: Jeremy [name] Treating Bakary Bramer/Extender: Rowan Blase in Treatment: 0 Active Problems Location of Pain Severity and Description of Pain Patient Has Paino No Site Locations Pain Management and Medication Current Pain Management: Electronic Signature(s) Signed: 08/18/2020 1:04:35 PM By: Lolita Cram Signed: 08/20/2020 12:09:52 PM By: Yevonne Pax RN Entered By: Lolita Cram on 08/18/2020 11:07:59 Jeremy [name] ([id number]) -------------------------------------------------------------------------------- Wound Assessment Details Patient Name: Jeremy [name] Date of Service: 08/18/2020 10:45 AM  Medical Record Number: [id number] Patient Account Number: [id number] Date of Birth/Sex: January 29, 1956 (65 y.o. M) Treating RN: Yevonne Pax Primary Care Judea Fennimore: Jeremy [name] Other Clinician: Lolita Cram Referring Demmi Sindt: Jeremy [name] Treating  Eliyahu Bille/Extender: Rowan Blase in Treatment: 0 Wound Status Wound Number: 1 Primary Infection - not elsewhere classified Etiology: Wound Location: Right, Lateral Ankle Wound Status: Open Wounding Event: Gradually Appeared Comorbid Coronary Artery Disease, Hypertension, Type II Date Acquired: 07/15/2020 History: Diabetes, Osteomyelitis Weeks Of Treatment: 0 Clustered Wound: No Photos Wound Measurements Length: (cm) 7 Width: (cm) 1 Depth: (cm) 0.5 Area: (cm) 5.498 Volume: (cm) 2.749 % Reduction in Area: 0% % Reduction in Volume: 0% Tunneling: No Undermining: Yes Starting Position (o'clock): 12 Ending Position (o'clock): 12 Maximum Distance: (cm) 0.4 Wound Description Classification: Full Thickness With Exposed Support Structures Wound Margin: Flat and Intact Exudate Amount: Large Exudate Type: Serous Exudate Color: amber Foul Odor After Cleansing: No Slough/Fibrino Yes Wound Bed Granulation Amount: None Present (0%) Exposed Structure Necrotic Amount: Large (67-100%) Fascia Exposed: No Necrotic Quality: Adherent Slough Fat Layer (Subcutaneous Tissue) Exposed: Yes Tendon Exposed: No Muscle Exposed: No Joint Exposed: No Bone Exposed: Yes Assessment Notes Probes down to bone and or hardware. Electronic Signature(s) Signed: 08/18/2020 1:50:11 PM By: Elliot Gurney, BSN, RN, CWS, Kim RN, BSN Signed: 08/20/2020 12:09:52 PM By: Yevonne Pax RN Jeremy [name], Jeremy [name] ([id number]) Entered By: Elliot Gurney BSN, RN, CWS, Kim on 08/18/2020 11:34:36 Jeremy [name], Jeremy [name] ([id number]) -------------------------------------------------------------------------------- Vitals Details Patient Name: Jeremy [name] Date of Service: 08/18/2020 10:45 AM Medical Record Number: [id number] Patient Account Number: [id number] Date of Birth/Sex: 1955/06/30 (65 y.o. M) Treating RN: Yevonne Pax Primary Care Jarom Govan: Jeremy [name] Other Clinician: Lolita Cram Referring Reinhold Rickey: Jeremy [name] Treating  Ziyad Dyar/Extender: Rowan Blase in Treatment: 0 Vital Signs Time Taken: 11:00 Temperature (F): 98.1 Pulse (bpm): 132 Respiratory Rate (breaths/min): 20 Blood Pressure (mmHg): 139/99 Reference Range: 80 - 120 mg / dl Electronic Signature(s) Signed: 08/18/2020 1:04:35 PM By: Lolita Cram Entered By: Lolita Cram on 08/18/2020 11:06:51

## 2020-08-20 NOTE — Progress Notes (Signed)
ALEK, BORGES ([id number]) Visit Report for 08/18/2020 Abuse/Suicide Risk Screen Details Patient Name: Jeremy [name], Jeremy [name] Date of Service: 08/18/2020 10:45 AM Medical Record Number: [id number] Patient Account Number: [id number] Date of Birth/Sex: November 20, 1955 (64 y.o. M) Treating RN: Yevonne Pax Primary Care Tayshaun Kroh: Vincent Gros Other Clinician: Lolita Cram Referring Omeed Osuna: Vincent Gros Treating Jerelyn Trimarco/Extender: Rowan Blase in Treatment: 0 Abuse/Suicide Risk Screen Items Answer ABUSE RISK SCREEN: Has anyone close to you tried to hurt or harm you recentlyo No Do you feel uncomfortable with anyone in your familyo No Has anyone forced you do things that you didnot want to doo No Electronic Signature(s) Signed: 08/18/2020 1:04:35 PM By: Lolita Cram Signed: 08/20/2020 12:09:52 PM By: Yevonne Pax RN Entered By: Lolita Cram on 08/18/2020 10:59:06 Jeremy [name] ([id number]) -------------------------------------------------------------------------------- Activities of Daily Living Details Patient Name: Jeremy [name] Date of Service: 08/18/2020 10:45 AM Medical Record Number: [id number] Patient Account Number: [id number] Date of Birth/Sex: [birth year] (64 y.o. M) Treating RN: Yevonne Pax Primary Care Micah Barnier: Vincent Gros Other Clinician: Lolita Cram Referring Brodric Schauer: Vincent Gros Treating Zane Pellecchia/Extender: Rowan Blase in Treatment: 0 Activities of Daily Living Items Answer Activities of Daily Living (Please select one for each item) Drive Automobile Completely Able Take Medications Completely Able Use Telephone Completely Able Care for Appearance Completely Able Use Toilet Completely Able Bath / Shower Completely Able Dress Self Completely Able Feed Self Completely Able Walk Completely Able Get In / Out Bed Completely Able Housework Completely Able Prepare Meals Completely Able Handle Money Completely Able Shop for Self Completely  Able Electronic Signature(s) Signed: 08/18/2020 1:04:35 PM By: Lolita Cram Signed: 08/20/2020 12:09:52 PM By: Yevonne Pax RN Entered By: Lolita Cram on 08/18/2020 10:59:37 Jeremy [name] ([id number]) -------------------------------------------------------------------------------- Education Screening Details Patient Name: Jeremy [name] Date of Service: 08/18/2020 10:45 AM Medical Record Number: [id number] Patient Account Number: [id number] Date of Birth/Sex: Dec 16, 1955 (64 y.o. M) Treating RN: Yevonne Pax Primary Care Jocilyn Trego: Vincent Gros Other Clinician: Lolita Cram Referring Greyson Peavy: Vincent Gros Treating Benjamim Harnish/Extender: Rowan Blase in Treatment: 0 Primary Learner Assessed: Patient Learning Preferences/Education Level/Primary Language Learning Preference: Explanation, Demonstration Highest Education Level: High School Preferred Language: English Cognitive Barrier Language Barrier: No Translator Needed: No Memory Deficit: No Emotional Barrier: No Cultural/Religious Beliefs Affecting Medical Care: No Physical Barrier Impaired Vision: No Impaired Hearing: No Decreased Hand dexterity: No Knowledge/Comprehension Knowledge Level: High Comprehension Level: High Ability to understand written instructions: High Ability to understand verbal instructions: High Motivation Anxiety Level: Calm Cooperation: Cooperative Education Importance: Acknowledges Need Interest in Health Problems: Asks Questions Perception: Coherent Willingness to Engage in Self-Management High Activities: Readiness to Engage in Self-Management High Activities: Electronic Signature(s) Signed: 08/18/2020 1:04:35 PM By: Lolita Cram Signed: 08/20/2020 12:09:52 PM By: Yevonne Pax RN Entered By: Lolita Cram on 08/18/2020 11:00:24 Jeremy [name] ([id number]) -------------------------------------------------------------------------------- Fall Risk Assessment  Details Patient Name: Jeremy [name] Date of Service: 08/18/2020 10:45 AM Medical Record Number: [id number] Patient Account Number: [id number] Date of Birth/Sex: September 12, 1955 (64 y.o. M) Treating RN: Yevonne Pax Primary Care Emmaline Wahba: Vincent Gros Other Clinician: Lolita Cram Referring Tonna Palazzi: Vincent Gros Treating Smantha Boakye/Extender: Rowan Blase in Treatment: 0 Fall Risk Assessment Items Have you had 2 or more falls in the last 12 monthso 0 No Have you had any fall that resulted in injury in the last 12 monthso 0 No FALLS RISK SCREEN History of falling - immediate or within 3 months 0 No Secondary diagnosis (Do you have 2 or more medical diagnoseso) 0 No Ambulatory aid None/bed rest/wheelchair/nurse 0 No Crutches/cane/walker 0  No Furniture 0 No Intravenous therapy Access/Saline/Heparin Lock 0 No Gait/Transferring Normal/ bed rest/ wheelchair 0 No Weak (short steps with or without shuffle, stooped but able to lift head while walking, may 0 No seek support from furniture) Impaired (short steps with shuffle, may have difficulty arising from chair, head down, impaired 0 No balance) Mental Status Oriented to own ability 0 No Electronic Signature(s) Signed: 08/18/2020 1:04:35 PM By: Lolita Cram Signed: 08/20/2020 12:09:52 PM By: Yevonne Pax RN Entered By: Lolita Cram on 08/18/2020 11:00:39 Jeremy [name] ([id number]) -------------------------------------------------------------------------------- Foot Assessment Details Patient Name: Jeremy [name] Date of Service: 08/18/2020 10:45 AM Medical Record Number: [id number] Patient Account Number: [id number] Date of Birth/Sex: 01/06/1956 (64 y.o. M) Treating RN: Yevonne Pax Primary Care Dalexa Gentz: Vincent Gros Other Clinician: Lolita Cram Referring Jasean Ambrosia: Vincent Gros Treating Shakeela Rabadan/Extender: Rowan Blase in Treatment: 0 Foot Assessment Items Site Locations + = Sensation present, - =  Sensation absent, C = Callus, U = Ulcer R = Redness, W = Warmth, M = Maceration, PU = Pre-ulcerative lesion F = Fissure, S = Swelling, D = Dryness Assessment Right: Left: Other Deformity: No No Prior Foot Ulcer: No No Prior Amputation: No No Charcot Joint: No No Ambulatory Status: Gait: Electronic Signature(s) Signed: 08/18/2020 1:04:35 PM By: Lolita Cram Signed: 08/20/2020 12:09:52 PM By: Yevonne Pax RN Entered By: Lolita Cram on 08/18/2020 11:05:33 Jeremy [name] ([id number]) -------------------------------------------------------------------------------- Nutrition Risk Screening Details Patient Name: Jeremy [name] Date of Service: 08/18/2020 10:45 AM Medical Record Number: [id number] Patient Account Number: [id number] Date of Birth/Sex: 1956/02/19 (64 y.o. M) Treating RN: Yevonne Pax Primary Care Amani Marseille: Vincent Gros Other Clinician: Lolita Cram Referring Hoorain Kozakiewicz: Vincent Gros Treating Romey Mathieson/Extender: Rowan Blase in Treatment: 0 Height (in): Weight (lbs): Body Mass Index (BMI): Nutrition Risk Screening Items Score Screening NUTRITION RISK SCREEN: I have an illness or condition that made me change the kind and/or amount of food I eat 0 No I eat fewer than two meals per day 0 No I eat few fruits and vegetables, or milk products 0 No I have three or more drinks of beer, liquor or wine almost every day 0 No I have tooth or mouth problems that make it hard for me to eat 0 No I don't always have enough money to buy the food I need 0 No I eat alone most of the time 0 No I take three or more different prescribed or over-the-counter drugs a day 0 No Without wanting to, I have lost or gained 10 pounds in the last six months 0 No I am not always physically able to shop, cook and/or feed myself 0 No Nutrition Protocols Good Risk Protocol Moderate Risk Protocol High Risk Proctocol Risk Level: Good Risk Score: 0 Electronic Signature(s) Signed:  08/18/2020 1:04:35 PM By: Lolita Cram Signed: 08/20/2020 12:09:52 PM By: Yevonne Pax RN Entered By: Lolita Cram on 08/18/2020 11:01:11

## 2020-08-25 ENCOUNTER — Other Ambulatory Visit: Payer: Self-pay

## 2020-08-25 ENCOUNTER — Encounter: Payer: Medicaid Other | Admitting: Physician Assistant

## 2020-08-25 NOTE — Progress Notes (Signed)
Jeremy [name], Jeremy [name] ([id number]) Visit Report for 08/25/2020 Arrival Information Details Patient Name: Jeremy [name], Jeremy [name] Date of Service: 08/25/2020 9:00 AM Medical Record Number: [id number] Patient Account Number: [id number] Date of Birth/Sex: 1956/01/03 (64 y.o. M) Treating RN: Rogers Blocker Primary Care Rachid Parham: PATIENT, NO Other Clinician: Lolita Cram Referring Mette Southgate: Vincent Gros Treating Gerrit Rafalski/Extender: Rowan Blase in Treatment: 1 Visit Information History Since Last Visit Had a fall or experienced change in No Patient Arrived: Ambulatory activities of daily living that may affect Arrival Time: 09:08 risk of falls: Accompanied By: self Hospitalized since last visit: No Transfer Assistance: None Has Dressing in Place as Prescribed: Yes Patient Identification Verified: Yes Pain Present Now: No Secondary Verification Process Completed: Yes Patient Has Alerts: Yes Patient Alerts: type ll diabetic Electronic Signature(s) Signed: 08/25/2020 4:55:56 PM By: Phillis Haggis, Dondra Prader RN Entered By: Phillis Haggis, Dondra Prader on 08/25/2020 09:09:01 Jeremy [name] ([id number]) -------------------------------------------------------------------------------- Clinic Level of Care Assessment Details Patient Name: Jeremy [name] Date of Service: 08/25/2020 9:00 AM Medical Record Number: [id number] Patient Account Number: [id number] Date of Birth/Sex: 01/29/1956 (64 y.o. M) Treating RN: Huel Coventry Primary Care Justus Duerr: PATIENT, NO Other Clinician: Lolita Cram Referring Mikyah Alamo: Vincent Gros Treating Roma Bierlein/Extender: Rowan Blase in Treatment: 1 Clinic Level of Care Assessment Items TOOL 4 Quantity Score []  - Use when only an EandM is performed on FOLLOW-UP visit 0 ASSESSMENTS - Nursing Assessment / Reassessment X - Reassessment of Co-morbidities (includes updates in patient status) 1 10 X- 1 5 Reassessment of Adherence to Treatment Plan ASSESSMENTS -  Wound and Skin Assessment / Reassessment X - Simple Wound Assessment / Reassessment - one wound 1 5 []  - 0 Complex Wound Assessment / Reassessment - multiple wounds []  - 0 Dermatologic / Skin Assessment (not related to wound area) ASSESSMENTS - Focused Assessment []  - Circumferential Edema Measurements - multi extremities 0 []  - 0 Nutritional Assessment / Counseling / Intervention []  - 0 Lower Extremity Assessment (monofilament, tuning fork, pulses) []  - 0 Peripheral Arterial Disease Assessment (using hand held doppler) ASSESSMENTS - Ostomy and/or Continence Assessment and Care []  - Incontinence Assessment and Management 0 []  - 0 Ostomy Care Assessment and Management (repouching, etc.) PROCESS - Coordination of Care X - Simple Patient / Family Education for ongoing care 1 15 []  - 0 Complex (extensive) Patient / Family Education for ongoing care []  - 0 Staff obtains , Records, Test Results / Process Orders []  - 0 Staff telephones HHA, Nursing Homes / Clarify orders / etc []  - 0 Routine Transfer to another Facility (non-emergent condition) []  - 0 Routine Hospital Admission (non-emergent condition) []  - 0 New Admissions / / Ordering NPWT, Apligraf, etc. []  - 0 Emergency Hospital Admission (emergent condition) X- 1 10 Simple Discharge Coordination []  - 0 Complex (extensive) Discharge Coordination PROCESS - Special Needs []  - Pediatric / Minor Patient Management 0 []  - 0 Isolation Patient Management []  - 0 Hearing / Language / Visual special needs []  - 0 Assessment of Community assistance (transportation, D/C planning, etc.) []  - 0 Additional assistance / Altered mentation []  - 0 Support Surface(s) Assessment (bed, cushion, seat, etc.) INTERVENTIONS - Wound Cleansing / Measurement Math, Dayon ( ) X- 1 5 Simple Wound Cleansing - one wound []  - 0 Complex Wound Cleansing - multiple wounds X- 1 5 Wound Imaging (photographs  - any number of wounds) []  - 0 Wound Tracing (instead of photographs) X- 1 5 Simple Wound Measurement - one wound []  - 0 Complex Wound Measurement - multiple wounds INTERVENTIONS -  Wound Dressings []  - Small Wound Dressing one or multiple wounds 0 X- 1 15 Medium Wound Dressing one or multiple wounds []  - 0 Large Wound Dressing one or multiple wounds []  - 0 Application of Medications - topical []  - 0 Application of Medications - injection INTERVENTIONS - Miscellaneous []  - External ear exam 0 []  - 0 Specimen Collection (cultures, biopsies, blood, body fluids, etc.) []  - 0 Specimen(s) / Culture(s) sent or taken to Lab for analysis []  - 0 Patient Transfer (multiple staff / Nurse, adultHoyer Lift / Similar devices) []  - 0 Simple Staple / Suture removal (25 or less) []  - 0 Complex Staple / Suture removal (26 or more) []  - 0 Hypo / Hyperglycemic Management (close monitor of Blood Glucose) []  - 0 Ankle / Brachial Index (ABI) - do not check if billed separately X- 1 5 Vital Signs Has the patient been seen at the hospital within the last three years: Yes Total Score: 80 Level Of Care: New/Established - Level 3 Electronic Signature(s) Unsigned Entered By: Elliot GurneyWoody, BSN, RN, CWS, Kim on 08/25/2020 17:29:08 Signature(s): Date(s): Jeremy BottomOWELL, Jeremy [name] ([id number]) -------------------------------------------------------------------------------- Lower Extremity Assessment Details Patient Name: Jeremy BottomROWELL, Jeremy [name] Date of Service: 08/25/2020 9:00 AM Medical Record Number: [id number] Patient Account Number: [id number] Date of Birth/Sex: 07-13-1955 (64 y.o. M) Treating RN: Rogers BlockerSanchez, Kenia Primary Care Karizma Cheek: PATIENT, NO Other Clinician: Lolita CramBurnette, Kyara Referring Aleyda Gindlesperger: Vincent GrosBoscia, Heather Treating Malyna Budney/Extender: Rowan BlaseStone, Hoyt Weeks in Treatment: 1 Edema Assessment Assessed: [Left: No] [Right: No] [Left: Edema] [Right: :] Calf Left: Right: Point of Measurement: 33 cm From Medial Instep 36  cm Ankle Left: Right: Point of Measurement: 12 cm From Medial Instep 26 cm Vascular Assessment Pulses: Dorsalis Pedis Palpable: [Right:Yes] Electronic Signature(s) Signed: 08/25/2020 4:55:56 PM By: Phillis HaggisSanchez Pereyda, Dondra PraderKenia RN Entered By: Phillis HaggisSanchez Pereyda, Dondra PraderKenia on 08/25/2020 09:20:03 Jeremy BottomOWELL, Jeremy [name] ([id number]) -------------------------------------------------------------------------------- Multi Wound Chart Details Patient Name: Jeremy BottomOWELL, Jeremy [name] Date of Service: 08/25/2020 9:00 AM Medical Record Number: [id number] Patient Account Number: [id number] Date of Birth/Sex: 07-13-1955 (64 y.o. M) Treating RN: Rogers BlockerSanchez, Kenia Primary Care Londen Lorge: PATIENT, NO Other Clinician: Lolita CramBurnette, Kyara Referring Lakrisha Iseman: Vincent GrosBoscia, Heather Treating Olamide Carattini/Extender: Rowan BlaseStone, Hoyt Weeks in Treatment: 1 Vital Signs Height(in): Pulse(bpm): 132 Weight(lbs): Blood Pressure(mmHg): 135/83 Body Mass Index(BMI): Temperature(F): 97.8 Respiratory Rate(breaths/min): 18 Photos: [1:No Photos] [N/A:N/A] Wound Location: [1:Right, Lateral Ankle] [N/A:N/A] Wounding Event: [1:Gradually Appeared] [N/A:N/A] Primary Etiology: [1:Infection - not elsewhere classified] [N/A:N/A] Comorbid History: [1:Coronary Artery Disease, Hypertension, Type II Diabetes, Osteomyelitis] [N/A:N/A] Date Acquired: [1:07/15/2020] [N/A:N/A] Weeks of Treatment: [1:1] [N/A:N/A] Wound Status: [1:Open] [N/A:N/A] Measurements L x W x D (cm) [1:0.2x0.2x1.2] [N/A:N/A] Area (cm) : [1:0.031] [N/A:N/A] Volume (cm) : [1:0.038] [N/A:N/A] % Reduction in Area: [1:99.40%] [N/A:N/A] % Reduction in Volume: [1:98.60%] [N/A:N/A] Classification: [1:Full Thickness With Exposed Support Structures] [N/A:N/A] Exudate Amount: [1:Large] [N/A:N/A] Exudate Type: [1:Purulent] [N/A:N/A] Exudate Color: [1:yellow, brown, green] [N/A:N/A] Wound Margin: [1:Flat and Intact] [N/A:N/A] Granulation Amount: [1:None Present (0%)] [N/A:N/A] Necrotic Amount: [1:Large  (67-100%)] [N/A:N/A] Exposed Structures: [1:Bone: Yes Fascia: No Fat Layer (Subcutaneous Tissue): No Tendon: No Muscle: No Joint: No None] [N/A:N/A N/A] Treatment Notes Electronic Signature(s) Signed: 08/25/2020 4:55:56 PM By: Phillis HaggisSanchez Pereyda, Dondra PraderKenia RN Entered By: Phillis HaggisSanchez Pereyda, Dondra PraderKenia on 08/25/2020 09:23:39 Jeremy BottomOWELL, Jeremy [name] ([id number]) -------------------------------------------------------------------------------- Multi-Disciplinary Care Plan Details Patient Name: Jeremy BottomOWELL, Jeremy [name] Date of Service: 08/25/2020 9:00 AM Medical Record Number: [id number] Patient Account Number: [id number] Date of Birth/Sex: 07-13-1955 (64 y.o. M) Treating RN: Rogers BlockerSanchez, Kenia Primary Care Naavya Postma: PATIENT, NO Other Clinician: Lolita CramBurnette, Kyara Referring Shalese Strahan: Vincent GrosBoscia, Heather Treating Aspynn Clover/Extender: Rowan BlaseStone, Hoyt Weeks in Treatment:  1 Active Inactive Necrotic Tissue Nursing Diagnoses: Impaired tissue integrity related to necrotic/devitalized tissue Knowledge deficit related to management of necrotic/devitalized tissue Goals: Necrotic/devitalized tissue will be minimized in the wound bed Date Initiated: 08/18/2020 Target Resolution Date: 09/01/2020 Goal Status: Active Patient/caregiver will verbalize understanding of reason and process for debridement of necrotic tissue Date Initiated: 08/18/2020 Target Resolution Date: 09/01/2020 Goal Status: Active Interventions: Assess patient pain level pre-, during and post procedure and prior to discharge Provide education on necrotic tissue and debridement process Notes: Orientation to the Wound Care Program Nursing Diagnoses: Knowledge deficit related to the wound healing center program Goals: Patient/caregiver will verbalize understanding of the Wound Healing Center Program Date Initiated: 08/18/2020 Target Resolution Date: 09/01/2020 Goal Status: Active Interventions: Provide education on orientation to the wound center Notes: Osteomyelitis Nursing  Diagnoses: Knowledge deficit related to disease process and management Potential for infection: osteomyelitis Goals: Diagnostic evaluation for osteomyelitis completed as ordered Date Initiated: 08/18/2020 Target Resolution Date: 09/01/2020 Goal Status: Active Interventions: Assess for signs and symptoms of osteomyelitis resolution every visit Treatment Activities: Systemic antibiotics : 08/18/2020 X-ray : 08/18/2020 Notes: Jeremy [name] ([id number]) Soft Tissue Infection Nursing Diagnoses: Impaired tissue integrity Knowledge deficit related to disease process and management Knowledge deficit related to home infection control: handwashing, handling of soiled dressings, supply storage Potential for infection: soft tissue Goals: Patient will remain free of wound infection Date Initiated: 08/18/2020 Target Resolution Date: 09/01/2020 Goal Status: Active Patient/caregiver will verbalize understanding of or measures to prevent infection and contamination in the home setting Date Initiated: 08/18/2020 Target Resolution Date: 09/01/2020 Goal Status: Active Interventions: Assess signs and symptoms of infection every visit Treatment Activities: Culture and sensitivity : 08/18/2020 Notes: Wound/Skin Impairment Nursing Diagnoses: Impaired tissue integrity Knowledge deficit related to smoking impact on wound healing Goals: Patient/caregiver will verbalize understanding of skin care regimen Date Initiated: 08/18/2020 Target Resolution Date: 08/25/2020 Goal Status: Active Ulcer/skin breakdown will have a volume reduction of 30% by week 4 Date Initiated: 08/18/2020 Target Resolution Date: 09/18/2020 Goal Status: Active Interventions: Assess ulceration(s) every visit Treatment Activities: Referred to DME Burnard Enis for dressing supplies : 08/18/2020 Notes: Electronic Signature(s) Signed: 08/25/2020 4:55:56 PM By: Phillis Haggis, Dondra Prader RN Entered By: Phillis Haggis, Dondra Prader on 08/25/2020  09:22:25 Jeremy [name] ([id number]) -------------------------------------------------------------------------------- Pain Assessment Details Patient Name: Jeremy [name] Date of Service: 08/25/2020 9:00 AM Medical Record Number: [id number] Patient Account Number: [id number] Date of Birth/Sex: 1955-09-02 (64 y.o. M) Treating RN: Rogers Blocker Primary Care Harshini Trent: PATIENT, NO Other Clinician: Lolita Cram Referring Eri Mcevers: Vincent Gros Treating Sayer Masini/Extender: Rowan Blase in Treatment: 1 Active Problems Location of Pain Severity and Description of Pain Patient Has Paino No Site Locations Rate the pain. Current Pain Level: 0 Pain Management and Medication Current Pain Management: Electronic Signature(s) Signed: 08/25/2020 4:55:56 PM By: Phillis Haggis, Dondra Prader RN Entered By: Phillis Haggis, Kenia on 08/25/2020 09:11:12 Jeremy [name] ([id number]) -------------------------------------------------------------------------------- Wound Assessment Details Patient Name: Jeremy [name] Date of Service: 08/25/2020 9:00 AM Medical Record Number: [id number] Patient Account Number: [id number] Date of Birth/Sex: 1955-07-19 (64 y.o. M) Treating RN: Rogers Blocker Primary Care Japji Kok: PATIENT, NO Other Clinician: Lolita Cram Referring Sava Proby: Vincent Gros Treating Meoshia Billing/Extender: Rowan Blase in Treatment: 1 Wound Status Wound Number: 1 Primary Infection - not elsewhere classified Etiology: Wound Location: Right, Lateral Ankle Wound Status: Open Wounding Event: Gradually Appeared Comorbid Coronary Artery Disease, Hypertension, Type II Date Acquired: 07/15/2020 History: Diabetes, Osteomyelitis Weeks Of Treatment: 1 Clustered Wound: No Wound Measurements Length: (cm) 0.2 Width: (cm) 0.2 Depth: (  cm) 1.2 Area: (cm) 0.031 Volume: (cm) 0.038 % Reduction in Area: 99.4% % Reduction in Volume: 98.6% Epithelialization: None Tunneling:  No Undermining: No Wound Description Classification: Full Thickness With Exposed Support Structures Wound Margin: Flat and Intact Exudate Amount: Large Exudate Type: Purulent Exudate Color: yellow, brown, green Foul Odor After Cleansing: No Slough/Fibrino Yes Wound Bed Granulation Amount: None Present (0%) Exposed Structure Necrotic Amount: Large (67-100%) Fascia Exposed: No Necrotic Quality: Adherent Slough Fat Layer (Subcutaneous Tissue) Exposed: No Tendon Exposed: No Muscle Exposed: No Joint Exposed: No Bone Exposed: Yes Electronic Signature(s) Signed: 08/25/2020 4:55:56 PM By: Phillis Haggis, Dondra Prader RN Entered By: Phillis Haggis, Dondra Prader on 08/25/2020 09:18:21 Jeremy [name] ([id number]) -------------------------------------------------------------------------------- Vitals Details Patient Name: Jeremy [name] Date of Service: 08/25/2020 9:00 AM Medical Record Number: [id number] Patient Account Number: [id number] Date of Birth/Sex: Nov 12, 1955 (64 y.o. M) Treating RN: Rogers Blocker Primary Care [name] Stainback: PATIENT, NO Other Clinician: Lolita Cram Referring Laurieann Friddle: Vincent Gros Treating Lakyla Biswas/Extender: Rowan Blase in Treatment: 1 Vital Signs Time Taken: 09:09 Temperature (F): 97.8 Pulse (bpm): 132 Respiratory Rate (breaths/min): 18 Blood Pressure (mmHg): 135/83 Reference Range: 80 - 120 mg / dl Electronic Signature(s) Signed: 08/25/2020 4:55:56 PM By: Phillis Haggis, Dondra Prader RN Entered By: Phillis Haggis, Dondra Prader on 08/25/2020 09:11:06

## 2020-08-25 NOTE — Progress Notes (Addendum)
MINAS, BONSER ([id number]) Visit Report for 08/25/2020 Chief Complaint Document Details Patient Name: Jeremy [name], Jeremy [name] Date of Service: 08/25/2020 9:00 AM Medical Record Number: [id number] Patient Account Number: [id number] Date of Birth/Sex: 09-03-1955 (64 y.o. M) Treating RN: Yevonne Pax Primary Care Provider: PATIENT, NO Other Clinician: Lolita Cram Referring Provider: Vincent Gros Treating Provider/Extender: Rowan Blase in Treatment: 1 Information Obtained from: Patient Chief Complaint Right ankle ulcer Electronic Signature(s) Signed: 08/25/2020 9:20:35 AM By: Lenda Kelp PA-C Entered By: Lenda Kelp on 08/25/2020 09:20:35 Jeremy [name] ([id number]) -------------------------------------------------------------------------------- HPI Details Patient Name: Jeremy [name] Date of Service: 08/25/2020 9:00 AM Medical Record Number: [id number] Patient Account Number: [id number] Date of Birth/Sex: 03-12-1956 (64 y.o. M) Treating RN: Yevonne Pax Primary Care Provider: PATIENT, NO Other Clinician: Lolita Cram Referring Provider: Vincent Gros Treating Provider/Extender: Rowan Blase in Treatment: 1 History of Present Illness HPI Description: 08/18/2020 upon evaluation today patient presents for initial inspection here in our clinic concerning issues has been having with his right lateral ankle and this has been present for at least a year he tells me. He is a patient of Dr. Orland Jarred. Subsequently Dr. Orland Jarred has since retired hence the patient look this up and is coming here for wound care to see if we can help him out. He did have a screw pushed out of this location and since that time tells me that the screw was removed but nonetheless the hole has remained. Fortunately there does not appear to be any signs of active infection systemically at this point though it does raise the question as to whether or not this might be a bone/hardware infection  being that this has been open for so long. The patient does have a history of diabetes mellitus type 2, coronary artery disease, hypertension, and unfortunately does not seem to be doing as great as I would like to see him regard to his left ankle. 08/25/2020 upon evaluation today patient appears to be doing well with regard to his ankle ulcer compared to last week this is definitely smaller. With that being said it still does probe down to bone/hardware. I still think this can be appropriate for him to see the orthopedic specialist. He has not heard from them yet he tells me. Electronic Signature(s) Signed: 08/25/2020 9:27:00 AM By: Lenda Kelp PA-C Entered By: Lenda Kelp on 08/25/2020 09:27:00 Jeremy [name] ([id number]) -------------------------------------------------------------------------------- Physical Exam Details Patient Name: Jeremy [name] Date of Service: 08/25/2020 9:00 AM Medical Record Number: [id number] Patient Account Number: [id number] Date of Birth/Sex: 1955/07/03 (64 y.o. M) Treating RN: Yevonne Pax Primary Care Provider: PATIENT, NO Other Clinician: Lolita Cram Referring Provider: Vincent Gros Treating Provider/Extender: Rowan Blase in Treatment: 1 Constitutional Well-nourished and well-hydrated in no acute distress. Respiratory normal breathing without difficulty. Psychiatric this patient is able to make decisions and demonstrates good insight into disease process. Alert and Oriented x 3. pleasant and cooperative. Notes Patient's wound bed showed signs of some hyper granulation inside unfortunately. With that being said I am still probe down to either the bone or hardware right at the lateral portion of his ankle. Obviously this is not ideal and we do not want any fluid collected in tracking therefore I am going to suggest that the patient needs to really be using something that he can pack and a little bit more easily. I think Hydrofera  Blue rope may be better. Electronic Signature(s) Signed: 08/25/2020 9:41:51 AM By: Lenda Kelp PA-C Entered By: Lenda Kelp on  08/25/2020 09:41:51 Jeremy [name], Jeremy [name] ([id number]) -------------------------------------------------------------------------------- Physician Orders Details Patient Name: Jeremy [name] Date of Service: 08/25/2020 9:00 AM Medical Record Number: [id number] Patient Account Number: [id number] Date of Birth/Sex: 08/14/1955 (64 y.o. M) Treating RN: Rogers Blocker Primary Care Provider: PATIENT, NO Other Clinician: Lolita Cram Referring Provider: Vincent Gros Treating Provider/Extender: Rowan Blase in Treatment: 1 Verbal / Phone Orders: No Diagnosis Coding ICD-10 Coding Code Description L03.115 Cellulitis of right lower limb L97.312 Non-pressure chronic ulcer of right ankle with fat layer exposed E11.622 Type 2 diabetes mellitus with other skin ulcer I10 Essential (primary) hypertension I25.10 Atherosclerotic heart disease of native coronary artery without angina pectoris Follow-up Appointments Wound #1 Right,Lateral Ankle o Return Appointment in 1 week. Bathing/ Shower/ Hygiene Wound #1 Right,Lateral Ankle o Clean wound with Normal Saline or wound cleanser. Wound Treatment Wound #1 - Ankle Wound Laterality: Right, Lateral Cleanser: Normal Saline (Generic) 1 x Per Day/15 Days Discharge Instructions: Wash your hands with soap and water. Remove old dressing, discard into plastic bag and place into trash. Cleanse the wound with Normal Saline prior to applying a clean dressing using gauze sponges, not tissues or cotton balls. Do not scrub or use excessive force. Pat dry using gauze sponges, not tissue or cotton balls. Primary Dressing: Hydrofera Blue Classic Foam Rope Dressing, 9x6 (mm/in) (Generic) 1 x Per Day/15 Days Discharge Instructions: cut into thin strips and pack into the wound dry Secondary Dressing: Bordered Gauze Sterile-HBD  4x4 (in/in) (Generic) 1 x Per Day/15 Days Discharge Instructions: Cover wound with Bordered Guaze Sterile as directed Electronic Signature(s) Signed: 08/25/2020 4:25:37 PM By: Lenda Kelp PA-C Signed: 08/25/2020 4:55:56 PM By: Phillis Haggis, Dondra Prader RN Entered By: Phillis Haggis, Dondra Prader on 08/25/2020 09:26:17 Jeremy [name] ([id number]) -------------------------------------------------------------------------------- Problem List Details Patient Name: Jeremy [name] Date of Service: 08/25/2020 9:00 AM Medical Record Number: [id number] Patient Account Number: [id number] Date of Birth/Sex: 29-Apr-1956 (64 y.o. M) Treating RN: Yevonne Pax Primary Care Provider: PATIENT, NO Other Clinician: Lolita Cram Referring Provider: Vincent Gros Treating Provider/Extender: Rowan Blase in Treatment: 1 Active Problems ICD-10 Encounter Code Description Active Date MDM Diagnosis L03.115 Cellulitis of right lower limb 08/18/2020 No Yes L97.312 Non-pressure chronic ulcer of right ankle with fat layer exposed 08/18/2020 No Yes E11.622 Type 2 diabetes mellitus with other skin ulcer 08/18/2020 No Yes I10 Essential (primary) hypertension 08/18/2020 No Yes I25.10 Atherosclerotic heart disease of native coronary artery without angina 08/18/2020 No Yes pectoris Inactive Problems Resolved Problems Electronic Signature(s) Signed: 08/25/2020 9:20:28 AM By: Lenda Kelp PA-C Entered By: Lenda Kelp on 08/25/2020 09:20:28 Jeremy [name] ([id number]) -------------------------------------------------------------------------------- Progress Note Details Patient Name: Jeremy [name] Date of Service: 08/25/2020 9:00 AM Medical Record Number: [id number] Patient Account Number: [id number] Date of Birth/Sex: 1955-06-28 (64 y.o. M) Treating RN: Yevonne Pax Primary Care Provider: PATIENT, NO Other Clinician: Lolita Cram Referring Provider: Vincent Gros Treating Provider/Extender: Rowan Blase in Treatment: 1 Subjective Chief Complaint Information obtained from Patient Right ankle ulcer History of Present Illness (HPI) 08/18/2020 upon evaluation today patient presents for initial inspection here in our clinic concerning issues has been having with his right lateral ankle and this has been present for at least a year he tells me. He is a patient of Dr. Orland Jarred. Subsequently Dr. Orland Jarred has since retired hence the patient look this up and is coming here for wound care to see if we can help him out. He did have a screw pushed out of this location and since that time tells me  that the screw was removed but nonetheless the hole has remained. Fortunately there does not appear to be any signs of active infection systemically at this point though it does raise the question as to whether or not this might be a bone/hardware infection being that this has been open for so long. The patient does have a history of diabetes mellitus type 2, coronary artery disease, hypertension, and unfortunately does not seem to be doing as great as I would like to see him regard to his left ankle. 08/25/2020 upon evaluation today patient appears to be doing well with regard to his ankle ulcer compared to last week this is definitely smaller. With that being said it still does probe down to bone/hardware. I still think this can be appropriate for him to see the orthopedic specialist. He has not heard from them yet he tells me. Objective Constitutional Well-nourished and well-hydrated in no acute distress. Vitals Time Taken: 9:09 AM, Temperature: 97.8 F, Pulse: 132 bpm, Respiratory Rate: 18 breaths/min, Blood Pressure: 135/83 mmHg. Respiratory normal breathing without difficulty. Psychiatric this patient is able to make decisions and demonstrates good insight into disease process. Alert and Oriented x 3. pleasant and cooperative. General Notes: Patient's wound bed showed signs of some hyper  granulation inside unfortunately. With that being said I am still probe down to either the bone or hardware right at the lateral portion of his ankle. Obviously this is not ideal and we do not want any fluid collected in tracking therefore I am going to suggest that the patient needs to really be using something that he can pack and a little bit more easily. I think Hydrofera Blue rope may be better. Integumentary (Hair, Skin) Wound #1 status is Open. Original cause of wound was Gradually Appeared. The date acquired was: 07/15/2020. The wound has been in treatment 1 weeks. The wound is located on the Right,Lateral Ankle. The wound measures 0.2cm length x 0.2cm width x 1.2cm depth; 0.031cm^2 area and 0.038cm^3 volume. There is bone exposed. There is no tunneling or undermining noted. There is a large amount of purulent drainage noted. The wound margin is flat and intact. There is no granulation within the wound bed. There is a large (67-100%) amount of necrotic tissue within the wound bed including Adherent Slough. Assessment Active Problems ICD-10 Cellulitis of right lower limb Non-pressure chronic ulcer of right ankle with fat layer exposed Picou, Beulah ([id number]) Type 2 diabetes mellitus with other skin ulcer Essential (primary) hypertension Atherosclerotic heart disease of native coronary artery without angina pectoris Plan Follow-up Appointments: Wound #1 Right,Lateral Ankle: Return Appointment in 1 week. Bathing/ Shower/ Hygiene: Wound #1 Right,Lateral Ankle: Clean wound with Normal Saline or wound cleanser. WOUND #1: - Ankle Wound Laterality: Right, Lateral Cleanser: Normal Saline (Generic) 1 x Per Day/15 Days Discharge Instructions: Wash your hands with soap and water. Remove old dressing, discard into plastic bag and place into trash. Cleanse the wound with Normal Saline prior to applying a clean dressing using gauze sponges, not tissues or cotton balls. Do not scrub or  use excessive force. Pat dry using gauze sponges, not tissue or cotton balls. Primary Dressing: Hydrofera Blue Classic Foam Rope Dressing, 9x6 (mm/in) (Generic) 1 x Per Day/15 Days Discharge Instructions: cut into thin strips and pack into the wound dry Secondary Dressing: Bordered Gauze Sterile-HBD 4x4 (in/in) (Generic) 1 x Per Day/15 Days Discharge Instructions: Cover wound with Bordered Guaze Sterile as directed 1. After lengthy discussion with the patient today I did advise  that I think Iodoflex will be the ideal thing for him currently. I would suggest that we go ahead and see about using on the majority of the wounds try to help clean up the situation. He is in agreement with giving that a try. 2. I am also can recommend at this time that we have the patient use Betadine for the toes on his right foot as he does not really want to use the Iodoflex he thinks that just keep them dry is the best thing which I think the Betadine painted over the area could be ideal for him. 3. We will continue to monitor for how things are progressing thumb hopeful that he will show signs of good improvement with the current wound care measures and dressings. We will see patient back for reevaluation in 1 week here in the clinic. If anything worsens or changes patient will contact our office for additional recommendations. We will see him for nurse visit towards the end of the week. Electronic Signature(s) Signed: 08/25/2020 12:47:36 PM By: Lenda Kelp PA-C Entered By: Lenda Kelp on 08/25/2020 12:47:36 Jeremy [name] ([id number]) -------------------------------------------------------------------------------- SuperBill Details Patient Name: Jeremy [name] Date of Service: 08/25/2020 Medical Record Number: [id number] Patient Account Number: [id number] Date of Birth/Sex: 16-Apr-1956 (64 y.o. M) Treating RN: Yevonne Pax Primary Care Provider: PATIENT, NO Other Clinician: Lolita Cram Referring  Provider: Vincent Gros Treating Provider/Extender: Rowan Blase in Treatment: 1 Diagnosis Coding ICD-10 Codes Code Description L03.115 Cellulitis of right lower limb L97.312 Non-pressure chronic ulcer of right ankle with fat layer exposed E11.622 Type 2 diabetes mellitus with other skin ulcer I10 Essential (primary) hypertension I25.10 Atherosclerotic heart disease of native coronary artery without angina pectoris Physician Procedures CPT4 Code: [id number] Description: 99213 - WC PHYS LEVEL 3 - EST PT Modifier: Quantity: 1 CPT4 Code: Description: ICD-10 Diagnosis Description L03.115 Cellulitis of right lower limb L97.312 Non-pressure chronic ulcer of right ankle with fat layer exposed E11.622 Type 2 diabetes mellitus with other skin ulcer I10 Essential (primary) hypertension Modifier: Quantity: Electronic Signature(s) Signed: 08/25/2020 12:47:46 PM By: Lenda Kelp PA-C Entered By: Lenda Kelp on 08/25/2020 12:47:46

## 2020-09-01 ENCOUNTER — Encounter: Payer: Medicaid Other | Admitting: Physician Assistant

## 2020-09-05 ENCOUNTER — Encounter: Payer: Medicaid Other | Admitting: Physician Assistant

## 2020-09-05 ENCOUNTER — Other Ambulatory Visit: Payer: Self-pay

## 2020-09-05 DIAGNOSIS — L97316 Non-pressure chronic ulcer of right ankle with bone involvement without evidence of necrosis: Secondary | ICD-10-CM | POA: Diagnosis not present

## 2020-09-05 DIAGNOSIS — I2510 Atherosclerotic heart disease of native coronary artery without angina pectoris: Secondary | ICD-10-CM | POA: Diagnosis not present

## 2020-09-05 DIAGNOSIS — L03115 Cellulitis of right lower limb: Secondary | ICD-10-CM | POA: Diagnosis not present

## 2020-09-05 DIAGNOSIS — I10 Essential (primary) hypertension: Secondary | ICD-10-CM | POA: Diagnosis not present

## 2020-09-05 DIAGNOSIS — E11622 Type 2 diabetes mellitus with other skin ulcer: Secondary | ICD-10-CM | POA: Diagnosis present

## 2020-09-05 NOTE — Progress Notes (Addendum)
Jeremy [name], Jahmad ([id number]) Visit Report for 09/05/2020 Chief Complaint Document Details Patient Name: Jeremy [name], Conal Date of Service: 09/05/2020 9:00 AM Medical Record Number: [id number] Patient Account Number: [id number] Date of Birth/Sex: [birth year] (64 y.o. M) Treating RN: Yevonne PaxEpps, [name] Primary Care Provider: PATIENT, NO Other Clinician: Lolita CramBurnette, Kyara Referring Provider: Vincent GrosBoscia, Heather Treating Provider/Extender: Rowan BlaseStone, Mykaila Blunck Weeks in Treatment: 2 Information Obtained from: Patient Chief Complaint Right ankle ulcer Electronic Signature(s) Signed: 09/05/2020 9:35:17 AM By: Lenda KelpStone III, Ashlon Lottman PA-C Entered By: Lenda KelpStone III, Wafa Martes on 09/05/2020 09:35:17 Jeremy BottomOWELL, Robin ([id number]) -------------------------------------------------------------------------------- HPI Details Patient Name: Jeremy BottomOWELL, Constant Date of Service: 09/05/2020 9:00 AM Medical Record Number: [id number] Patient Account Number: [id number] Date of Birth/Sex: [birth year] (64 y.o. M) Treating RN: Yevonne PaxEpps, [name] Primary Care Provider: PATIENT, NO Other Clinician: Lolita CramBurnette, Kyara Referring Provider: Vincent GrosBoscia, Heather Treating Provider/Extender: Rowan BlaseStone, Rayvon Brandvold Weeks in Treatment: 2 History of Present Illness HPI Description: 08/18/2020 upon evaluation today patient presents for initial inspection here in our clinic concerning issues has been having with his right lateral ankle and this has been present for at least a year he tells me. He is a patient of Dr. Orland Jarredroxler. Subsequently Dr. Orland Jarredroxler has since retired hence the patient look this up and is coming here for wound care to see if we can help him out. He did have a screw pushed out of this location and since that time tells me that the screw was removed but nonetheless the hole has remained. Fortunately there does not appear to be any signs of active infection systemically at this point though it does raise the question as to whether or not this might be a bone/hardware infection  being that this has been open for so long. The patient does have a history of diabetes mellitus type 2, coronary artery disease, hypertension, and unfortunately does not seem to be doing as great as I would like to see him regard to his left ankle. 08/25/2020 upon evaluation today patient appears to be doing well with regard to his ankle ulcer compared to last week this is definitely smaller. With that being said it still does probe down to bone/hardware. I still think this can be appropriate for him to see the orthopedic specialist. He has not heard from them yet he tells me. 09/05/2020 upon evaluation today patient appears to be doing extremely well in regard to his wound all things considered. I do not see that this is gotten any worse. Unfortunately also do not think he is gotten any better. We did make a referral to orthopedics to Dr. Victorino DikeHewitt specifically but the patient tells me that he has not heard from them. That is unusual as they normally get in touch with patients fairly quickly. I will ask him if he possibly missed a phone call he tells me he will check when he gets home. Nonetheless [facility] he did not tell me previously therefore we will get a see about making a referral to Dr. Logan BoresEvans to see what he potentially could recommend for the patient as well I think Dr. Logan BoresEvans is excellent and a good option here. Electronic Signature(s) Signed: 09/05/2020 12:35:27 PM By: Lenda KelpStone III, Marshell Dilauro PA-C Entered By: Lenda KelpStone III, Erminie Foulks on 09/05/2020 12:35:27 Jeremy [name], Brion ([id number]) -------------------------------------------------------------------------------- Physical Exam Details Patient Name: Jeremy BottomOWELL, Camdan Date of Service: 09/05/2020 9:00 AM Medical Record Number: [id number] Patient Account Number: [id number] Date of Birth/Sex: [birth year] (64 y.o. M) Treating RN: Yevonne PaxEpps, [name] Primary Care Provider: PATIENT, NO Other Clinician: Lolita CramBurnette, Kyara  Referring  Provider: Vincent Gros Treating Provider/Extender: Allen Derry Weeks in Treatment: 2 Constitutional Well-nourished and well-hydrated in no acute distress. Respiratory normal breathing without difficulty. Psychiatric this patient is able to make decisions and demonstrates good insight into disease process. Alert and Oriented x 3. pleasant and cooperative. Notes Upon inspection patient's wound bed actually showed signs of good granulation epithelization at this point. There does not appear to be any evidence of active infection which is also with that being said that is more of a systemic issue. I do think that based on the x-ray there is a good chance that the patient probably has a hardware infection which is what I am most concerned about. That is the reason I feel like he needs to see a specialist. Electronic Signature(s) Signed: 09/05/2020 12:35:53 PM By: Lenda Kelp PA-C Entered By: Lenda Kelp on 09/05/2020 12:35:52 Jeremy Bottom ([id number]) -------------------------------------------------------------------------------- Physician Orders Details Patient Name: Jeremy Bottom Date of Service: 09/05/2020 9:00 AM Medical Record Number: [id number] Patient Account Number: [id number] Date of Birth/Sex: August 28, 1955 (64 y.o. M) Treating RN: Yevonne Pax Primary Care Provider: PATIENT, NO Other Clinician: Lolita Cram Referring Provider: Vincent Gros Treating Provider/Extender: Rowan Blase in Treatment: 2 Verbal / Phone Orders: No Diagnosis Coding ICD-10 Coding Code Description L03.115 Cellulitis of right lower limb L97.312 Non-pressure chronic ulcer of right ankle with fat layer exposed E11.622 Type 2 diabetes mellitus with other skin ulcer I10 Essential (primary) hypertension I25.10 Atherosclerotic heart disease of native coronary artery without angina pectoris Follow-up Appointments Wound #1 Right,Lateral Ankle o Return Appointment in 1 week. Bathing/  Shower/ Hygiene Wound #1 Right,Lateral Ankle o Clean wound with Normal Saline or wound cleanser. Wound Treatment Wound #1 - Ankle Wound Laterality: Right, Lateral Cleanser: Normal Saline (Generic) 1 x Per Day/15 Days Discharge Instructions: Wash your hands with soap and water. Remove old dressing, discard into plastic bag and place into trash. Cleanse the wound with Normal Saline prior to applying a clean dressing using gauze sponges, not tissues or cotton balls. Do not scrub or use excessive force. Pat dry using gauze sponges, not tissue or cotton balls. Primary Dressing: Hydrofera Blue Classic Foam Rope Dressing, 9x6 (mm/in) (Generic) 1 x Per Day/15 Days Discharge Instructions: cut into thin strips and pack into the wound dry Secondary Dressing: Bordered Gauze Sterile-HBD 4x4 (in/in) (Generic) 1 x Per Day/15 Days Discharge Instructions: Cover wound with Bordered Guaze Sterile as directed Electronic Signature(s) Signed: 09/05/2020 2:05:44 PM By: Lenda Kelp PA-C Signed: 09/08/2020 5:08:01 PM By: Yevonne Pax RN Entered By: Yevonne Pax on 09/05/2020 13:41:58 Jeremy Bottom ([id number]) -------------------------------------------------------------------------------- Problem List Details Patient Name: Jeremy Bottom Date of Service: 09/05/2020 9:00 AM Medical Record Number: [id number] Patient Account Number: [id number] Date of Birth/Sex: 07/13/1955 (64 y.o. M) Treating RN: Yevonne Pax Primary Care Provider: PATIENT, NO Other Clinician: Lolita Cram Referring Provider: Vincent Gros Treating Provider/Extender: Rowan Blase in Treatment: 2 Active Problems ICD-10 Encounter Code Description Active Date MDM Diagnosis L03.115 Cellulitis of right lower limb 08/18/2020 No Yes L97.312 Non-pressure chronic ulcer of right ankle with fat layer exposed 08/18/2020 No Yes E11.622 Type 2 diabetes mellitus with other skin ulcer 08/18/2020 No Yes I10 Essential (primary) hypertension  08/18/2020 No Yes I25.10 Atherosclerotic heart disease of native coronary artery without angina 08/18/2020 No Yes pectoris Inactive Problems Resolved Problems Electronic Signature(s) Signed: 09/05/2020 9:35:12 AM By: Lenda Kelp PA-C Entered By: Lenda Kelp on 09/05/2020 09:35:12 Jeremy Bottom ([id number]) -------------------------------------------------------------------------------- Progress Note Details Patient Name: Jeremy Bottom  Date of Service: 09/05/2020 9:00 AM Medical Record Number: [id number] Patient Account Number: [id number] Date of Birth/Sex: 12-25-1955 (64 y.o. M) Treating RN: Yevonne Pax Primary Care Provider: PATIENT, NO Other Clinician: Lolita Cram Referring Provider: Vincent Gros Treating Provider/Extender: Rowan Blase in Treatment: 2 Subjective Chief Complaint Information obtained from Patient Right ankle ulcer History of Present Illness (HPI) 08/18/2020 upon evaluation today patient presents for initial inspection here in our clinic concerning issues has been having with his right lateral ankle and this has been present for at least a year he tells me. He is a patient of Dr. Orland Jarred. Subsequently Dr. Orland Jarred has since retired hence the patient look this up and is coming here for wound care to see if we can help him out. He did have a screw pushed out of this location and since that time tells me that the screw was removed but nonetheless the hole has remained. Fortunately there does not appear to be any signs of active infection systemically at this point though it does raise the question as to whether or not this might be a bone/hardware infection being that this has been open for so long. The patient does have a history of diabetes mellitus type 2, coronary artery disease, hypertension, and unfortunately does not seem to be doing as great as I would like to see him regard to his left ankle. 08/25/2020 upon evaluation today patient appears to be  doing well with regard to his ankle ulcer compared to last week this is definitely smaller. With that being said it still does probe down to bone/hardware. I still think this can be appropriate for him to see the orthopedic specialist. He has not heard from them yet he tells me. 09/05/2020 upon evaluation today patient appears to be doing extremely well in regard to his wound all things considered. I do not see that this is gotten any worse. Unfortunately also do not think he is gotten any better. We did make a referral to orthopedics to Dr. Victorino Dike specifically but the patient tells me that he has not heard from them. That is unusual as they normally get in touch with patients fairly quickly. I will ask him if he possibly missed a phone call he tells me he will check when he gets home. Nonetheless he does not want to go to [facility] anyway which he did not tell me previously therefore we will get a see about making a referral to Dr. Logan Bores to see what he potentially could recommend for the patient as well I think Dr. Logan Bores is excellent and a good option here. Objective Constitutional Well-nourished and well-hydrated in no acute distress. Vitals Time Taken: 9:24 AM, Temperature: 98.1 F, Pulse: 102 bpm, Respiratory Rate: 18 breaths/min, Blood Pressure: 137/98 mmHg. Respiratory normal breathing without difficulty. Psychiatric this patient is able to make decisions and demonstrates good insight into disease process. Alert and Oriented x 3. pleasant and cooperative. General Notes: Upon inspection patient's wound bed actually showed signs of good granulation epithelization at this point. There does not appear to be any evidence of active infection which is also with that being said that is more of a systemic issue. I do think that based on the x-ray there is a good chance that the patient probably has a hardware infection which is what I am most concerned about. That is the reason I feel like he needs  to see a specialist. Integumentary (Hair, Skin) Wound #1 status is Open. Original cause of wound was  Gradually Appeared. The date acquired was: 07/15/2020. The wound has been in treatment 2 weeks. The wound is located on the Right,Lateral Ankle. The wound measures 0.3cm length x 0.3cm width x 1cm depth; 0.071cm^2 area and 0.071cm^3 volume. There is bone exposed. There is no tunneling or undermining noted. There is a large amount of purulent drainage noted. The wound margin is flat and intact. There is no granulation within the wound bed. There is a large (67-100%) amount of necrotic tissue within the wound bed including Adherent Slough. NIKHOLAS, GEFFRE ([id number]) Assessment Active Problems ICD-10 Cellulitis of right lower limb Non-pressure chronic ulcer of right ankle with fat layer exposed Type 2 diabetes mellitus with other skin ulcer Essential (primary) hypertension Atherosclerotic heart disease of native coronary artery without angina pectoris Plan 1. I would recommend currently that we go ahead and continue with the wound care measures as before and the patient is in agreement with plan this includes the use of the Hydrofera Blue rope which I think has been beneficial for the patient. 2. Muscle can recommend that we have the patient continue to cover this with a border gauze dressing. 3. I would also recommend he needs to get in to see a specialist with regard to the fractured screw and lucency around I am concerned about the possibility of hardware infection here causing this wound to continue to remain open. We will see patient back for reevaluation in 1 week here in the clinic. If anything worsens or changes patient will contact our office for additional recommendations. Electronic Signature(s) Signed: 09/05/2020 12:36:36 PM By: Lenda Kelp PA-C Entered By: Lenda Kelp on 09/05/2020 12:36:35 Jeremy Bottom  ([id number]) -------------------------------------------------------------------------------- SuperBill Details Patient Name: Jeremy Bottom Date of Service: 09/05/2020 Medical Record Number: [id number] Patient Account Number: [id number] Date of Birth/Sex: 10-01-1955 (64 y.o. M) Treating RN: Yevonne Pax Primary Care Provider: PATIENT, NO Other Clinician: Lolita Cram Referring Provider: Vincent Gros Treating Provider/Extender: Rowan Blase in Treatment: 2 Diagnosis Coding ICD-10 Codes Code Description L03.115 Cellulitis of right lower limb L97.312 Non-pressure chronic ulcer of right ankle with fat layer exposed E11.622 Type 2 diabetes mellitus with other skin ulcer I10 Essential (primary) hypertension I25.10 Atherosclerotic heart disease of native coronary artery without angina pectoris Facility Procedures CPT4 Code: [id number] Description: 99213 - WOUND CARE VISIT-LEV 3 EST PT Modifier: Quantity: 1 Physician Procedures CPT4 Code: [id number] Description: 99213 - WC PHYS LEVEL 3 - EST PT Modifier: Quantity: 1 CPT4 Code: Description: ICD-10 Diagnosis Description L03.115 Cellulitis of right lower limb L97.312 Non-pressure chronic ulcer of right ankle with fat layer exposed E11.622 Type 2 diabetes mellitus with other skin ulcer I10 Essential (primary) hypertension Modifier: Quantity: Electronic Signature(s) Signed: 09/05/2020 2:05:44 PM By: Lenda Kelp PA-C Signed: 09/08/2020 5:08:01 PM By: Yevonne Pax RN Previous Signature: 09/05/2020 12:36:51 PM Version By: Lenda Kelp PA-C Entered By: Yevonne Pax on 09/05/2020 13:42:37

## 2020-09-08 NOTE — Progress Notes (Addendum)
Jeremy [name] ([id number]) Visit Report for 09/05/2020 Arrival Information Details Patient Name: Jeremy [name], Jeremy [name] Date of Service: 09/05/2020 9:00 AM Medical Record Number: [id number] Patient Account Number: [id number] Date of Birth/Sex: June 25, 1955 (64 y.o. M) Treating RN: Jeremy [name] Primary Care Jeremy [name]: PATIENT, NO Other Clinician: Lolita [name] Referring Jeremy [name]: Jeremy [name] Treating Jeremy [name]/Extender: Jeremy [name] in Treatment: 2 Visit Information History Since Last Visit Added or deleted any medications: No Patient Arrived: Ambulatory Had a fall or experienced change in No Arrival Time: 09:26 activities of daily living that may affect Accompanied By: self risk of falls: Transfer Assistance: None Hospitalized since last visit: No Patient Identification Verified: Yes Pain Present Now: No Secondary Verification Process Completed: Yes Patient Has Alerts: Yes Patient Alerts: type ll diabetic Electronic Signature(s) Signed: 09/05/2020 2:45:39 PM By: Jeremy [name] Entered By: Jeremy [name] on 09/05/2020 09:26:50 Jeremy [name] ([id number]) -------------------------------------------------------------------------------- Clinic Level of Care Assessment Details Patient Name: Jeremy [name] Date of Service: 09/05/2020 9:00 AM Medical Record Number: [id number] Patient Account Number: [id number] Date of Birth/Sex: Jul 09, 1955 (64 y.o. M) Treating RN: Jeremy [name] Primary Care Raymonda Pell: PATIENT, NO Other Clinician: Lolita [name] Referring Jeremy [name]: Jeremy [name] Treating Jeremy [name]/Extender: Jeremy [name] in Treatment: 2 Clinic Level of Care Assessment Items TOOL 4 Quantity Score X - Use when only an EandM is performed on FOLLOW-UP visit 1 0 ASSESSMENTS - Nursing Assessment / Reassessment X - Reassessment of Co-morbidities (includes updates in patient status) 1 10 X- 1 5 Reassessment of Adherence to Treatment Plan ASSESSMENTS - Wound and Skin  Assessment / Reassessment X - Simple Wound Assessment / Reassessment - one wound 1 5 []  - 0 Complex Wound Assessment / Reassessment - multiple wounds []  - 0 Dermatologic / Skin Assessment (not related to wound area) ASSESSMENTS - Focused Assessment []  - Circumferential Edema Measurements - multi extremities 0 []  - 0 Nutritional Assessment / Counseling / Intervention []  - 0 Lower Extremity Assessment (monofilament, tuning fork, pulses) []  - 0 Peripheral Arterial Disease Assessment (using hand held doppler) ASSESSMENTS - Ostomy and/or Continence Assessment and Care []  - Incontinence Assessment and Management 0 []  - 0 Ostomy Care Assessment and Management (repouching, etc.) PROCESS - Coordination of Care X - Simple Patient / Family Education for ongoing care 1 15 []  - 0 Complex (extensive) Patient / Family Education for ongoing care []  - 0 Staff obtains , Records, Test Results / Process Orders []  - 0 Staff telephones HHA, Nursing Homes / Clarify orders / etc []  - 0 Routine Transfer to another Facility (non-emergent condition) []  - 0 Routine Hospital Admission (non-emergent condition) []  - 0 New Admissions / / Ordering NPWT, Apligraf, etc. []  - 0 Emergency Hospital Admission (emergent condition) X- 1 10 Simple Discharge Coordination []  - 0 Complex (extensive) Discharge Coordination PROCESS - Special Needs []  - Pediatric / Minor Patient Management 0 []  - 0 Isolation Patient Management []  - 0 Hearing / Language / Visual special needs []  - 0 Assessment of Community assistance (transportation, D/C planning, etc.) []  - 0 Additional assistance / Altered mentation []  - 0 Support Surface(s) Assessment (bed, cushion, seat, etc.) INTERVENTIONS - Wound Cleansing / Measurement Jeremy [name] ( ) X- 1 5 Simple Wound Cleansing - one wound []  - 0 Complex Wound Cleansing - multiple wounds X- 1 5 Wound Imaging (photographs - any number of  wounds) []  - 0 Wound Tracing (instead of photographs) X- 1 5 Simple Wound Measurement - one wound []  - 0 Complex Wound Measurement - multiple wounds INTERVENTIONS - Wound Dressings  X - Small Wound Dressing one or multiple wounds 1 10 []  - 0 Medium Wound Dressing one or multiple wounds []  - 0 Large Wound Dressing one or multiple wounds X- 1 5 Application of Medications - topical []  - 0 Application of Medications - injection INTERVENTIONS - Miscellaneous []  - External ear exam 0 []  - 0 Specimen Collection (cultures, biopsies, blood, body fluids, etc.) []  - 0 Specimen(s) / Culture(s) sent or taken to Lab for analysis []  - 0 Patient Transfer (multiple staff / Lift / Similar devices) []  - 0 Simple Staple / Suture removal (25 or less) []  - 0 Complex Staple / Suture removal (26 or more) []  - 0 Hypo / Hyperglycemic Management (close monitor of Blood Glucose) []  - 0 Ankle / Brachial Index (ABI) - do not check if billed separately X- 1 5 Vital Signs Has the patient been seen at the hospital within the last three years: Yes Total Score: 80 Level Of Care: New/Established - Level 3 Electronic Signature(s) Signed: 09/08/2020 5:08:01 PM By: RN Entered By: on 09/05/2020 13:42:25 ( ) -------------------------------------------------------------------------------- Lower Extremity Assessment Details Patient Name: Jeremy [name] Date of Service: 09/05/2020 9:00 AM Medical Record Number: Patient Account Number: Date of Birth/Sex: 06/25/1955 (64 y.o. M) Treating RN: Jeremy [name] Primary Care Jeremy [name]: PATIENT, NO Other Clinician: Yevonne [name] Referring Jeremy [name]: 09/07/2020 Treating Osha Rane/Extender: Jeremy [name] in Treatment: 2 Edema Assessment Assessed: [Left: No] [Right: No] [Left: Edema] [Right: :] Calf Left: Right: Point of Measurement: 33 cm From Medial Instep 36.2 cm Ankle Left:  Right: Point of Measurement: 12 cm From Medial Instep 27.2 cm Vascular Assessment Pulses: Dorsalis Pedis Palpable: [Right:Yes] Electronic Signature(s) Signed: 09/05/2020 2:45:39 PM By: Jeremy [name] Signed: 09/08/2020 5:08:01 PM By: [id number] RN Entered By: [id number] on 09/05/2020 09:38:22 01-[birth year]2000 (Jeremy [name]) -------------------------------------------------------------------------------- Multi Wound Chart Details Patient Name: Jeremy [name] Date of Service: 09/05/2020 9:00 AM Medical Record Number: Jeremy [name] Patient Account Number: 09/07/2020 Date of Birth/Sex: 11/26/1955 (64 y.o. M) Treating RN: Jeremy [name] Primary Care Georgeana Oertel: PATIENT, NO Other Clinician: Lolita [name] Referring Kailia Starry: 09/07/2020 Treating Jeromy Borcherding/Extender: Jeremy [name] in Treatment: 2 Vital Signs Height(in): Pulse(bpm): 102 Weight(lbs): Blood Pressure(mmHg): 137/98 Body Mass Index(BMI): Temperature(F): 98.1 Respiratory Rate(breaths/min): 18 Photos: [N/A:N/A] Wound Location: Right, Lateral Ankle N/A N/A Wounding Event: Gradually Appeared N/A N/A Primary Etiology: Infection - not elsewhere classified N/A N/A Comorbid History: Coronary Artery Disease, N/A N/A Hypertension, Type II Diabetes, Osteomyelitis Date Acquired: 07/15/2020 N/A N/A Weeks of Treatment: 2 N/A N/A Wound Status: Open N/A N/A Measurements L x W x D (cm) 0.3x0.3x1 N/A N/A Area (cm) : 0.071 N/A N/A Volume (cm) : 0.071 N/A N/A % Reduction in Area: 98.70% N/A N/A % Reduction in Volume: 97.40% N/A N/A Classification: Full Thickness With Exposed N/A N/A Support Structures Exudate Amount: Large N/A N/A Exudate Type: Purulent N/A N/A Exudate Color: yellow, brown, green N/A N/A Wound Margin: Flat and Intact N/A N/A Granulation Amount: None Present (0%) N/A N/A Necrotic Amount: Large (67-100%) N/A N/A Exposed Structures: Bone: Yes N/A N/A Fascia: No Fat Layer (Subcutaneous Tissue): No Tendon:  No Muscle: No Joint: No Epithelialization: None N/A N/A Treatment Notes Electronic Signature(s) Signed: 09/08/2020 5:08:01 PM By: 09/07/2020 RN Entered By: [id number] on 09/05/2020 10:09:35 06/09/1956 (01-22-2000) -------------------------------------------------------------------------------- Multi-Disciplinary Care Plan Details Patient Name: Jeremy [name] Date of Service: 09/05/2020 9:00 AM Medical Record Number: Jeremy [name] Patient Account Number: Jeremy [name] Date of Birth/Sex: [birth year] (64 y.o. M)  Treating RN: Jeremy [name] Primary Care Bunyan Brier: PATIENT, NO Other Clinician: Lolita [name] Referring Zannie Runkle: Jeremy [name] Treating Perrin Eddleman/Extender: Jeremy [name] in Treatment: 2 Active Inactive Electronic Signature(s) Signed: 10/24/2020 1:14:32 PM By: Elliot Gurney, BSN, RN, CWS, Kim RN, BSN Signed: 11/18/2020 8:04:09 AM By: Jeremy Pax RN Previous Signature: 09/08/2020 5:08:01 PM Version By: Jeremy Pax RN Entered By: Elliot Gurney BSN, RN, CWS, Kim on 10/24/2020 13:14:31 Jeremy [name] ([id number]) -------------------------------------------------------------------------------- Pain Assessment Details Patient Name: Jeremy [name] Date of Service: 09/05/2020 9:00 AM Medical Record Number: [id number] Patient Account Number: [id number] Date of Birth/Sex: 1956-03-20 (64 y.o. M) Treating RN: Jeremy [name] Primary Care Khiley Lieser: PATIENT, NO Other Clinician: Lolita [name] Referring Tacuma Graffam: Jeremy [name] Treating Khameron Gruenwald/Extender: Jeremy [name] in Treatment: 2 Active Problems Location of Pain Severity and Description of Pain Patient Has Paino No Site Locations Pain Management and Medication Current Pain Management: Electronic Signature(s) Signed: 09/05/2020 2:45:39 PM By: Jeremy [name] Signed: 09/08/2020 5:08:01 PM By: Jeremy Pax RN Entered By: Jeremy [name] on 09/05/2020 09:29:52 Jeremy [name]  ([id number]) -------------------------------------------------------------------------------- Patient/Caregiver Education Details Patient Name: Jeremy [name] Date of Service: 09/05/2020 9:00 AM Medical Record Number: [id number] Patient Account Number: [id number] Date of Birth/Gender: Sep 24, 1955 (64 y.o. M) Treating RN: Jeremy [name] Primary Care Physician: PATIENT, NO Other Clinician: Lolita [name] Referring Physician: Vincent [name] Treating Physician/Extender: Jeremy [name] in Treatment: 2 Education Assessment Education Provided To: Patient Education Topics Provided Wound Debridement: Methods: Explain/Verbal Responses: State content correctly Electronic Signature(s) Signed: 09/08/2020 5:08:01 PM By: Jeremy Pax RN Entered By: Jeremy [name] on 09/05/2020 13:43:00 Jeremy [name] ([id number]) -------------------------------------------------------------------------------- Wound Assessment Details Patient Name: Jeremy [name] Date of Service: 09/05/2020 9:00 AM Medical Record Number: [id number] Patient Account Number: [id number] Date of Birth/Sex: 01-31-1956 (64 y.o. M) Treating RN: Jeremy [name] Primary Care Annalynne Ibanez: PATIENT, NO Other Clinician: Lolita [name] Referring Truong Delcastillo: Jeremy [name] Treating Robel Wuertz/Extender: Jeremy [name] in Treatment: 2 Wound Status Wound Number: 1 Primary Infection - not elsewhere classified Etiology: Wound Location: Right, Lateral Ankle Wound Status: Open Wounding Event: Gradually Appeared Comorbid Coronary Artery Disease, Hypertension, Type II Date Acquired: 07/15/2020 History: Diabetes, Osteomyelitis Weeks Of Treatment: 2 Clustered Wound: No Photos Wound Measurements Length: (cm) 0.3 Width: (cm) 0.3 Depth: (cm) 1 Area: (cm) 0.071 Volume: (cm) 0.071 % Reduction in Area: 98.7% % Reduction in Volume: 97.4% Epithelialization: None Tunneling: No Undermining: No Wound Description Classification: Full Thickness With  Exposed Support Structures Wound Margin: Flat and Intact Exudate Amount: Large Exudate Type: Purulent Exudate Color: yellow, brown, green Foul Odor After Cleansing: No Slough/Fibrino Yes Wound Bed Granulation Amount: None Present (0%) Exposed Structure Necrotic Amount: Large (67-100%) Fascia Exposed: No Necrotic Quality: Adherent Slough Fat Layer (Subcutaneous Tissue) Exposed: No Tendon Exposed: No Muscle Exposed: No Joint Exposed: No Bone Exposed: Yes Electronic Signature(s) Signed: 09/05/2020 2:45:39 PM By: Jeremy [name] Signed: 09/08/2020 5:08:01 PM By: Jeremy Pax RN Entered By: Jeremy [name] on 09/05/2020 09:37:34 Jeremy [name] ([id number]) -------------------------------------------------------------------------------- Vitals Details Patient Name: Jeremy [name] Date of Service: 09/05/2020 9:00 AM Medical Record Number: [id number] Patient Account Number: [id number] Date of Birth/Sex: 26-Dec-1955 (64 y.o. M) Treating RN: Jeremy [name] Primary Care Stonewall Doss: PATIENT, NO Other Clinician: Lolita [name] Referring Esparanza Krider: Jeremy [name] Treating Nealy Karapetian/Extender: Jeremy [name] in Treatment: 2 Vital Signs Time Taken: 09:24 Temperature (F): 98.1 Pulse (bpm): 102 Respiratory Rate (breaths/min): 18 Blood Pressure (mmHg): 137/98 Reference Range: 80 - 120 mg / dl Electronic Signature(s) Signed: 09/05/2020 2:45:39 PM By: Jeremy [name] Entered By: Jeremy [name] on 09/05/2020 09:29:46

## 2020-09-09 ENCOUNTER — Ambulatory Visit: Payer: Medicaid Other | Admitting: Podiatry

## 2020-10-28 ENCOUNTER — Other Ambulatory Visit: Payer: Self-pay

## 2020-10-28 ENCOUNTER — Encounter: Payer: Medicaid Other | Attending: Physician Assistant | Admitting: Physician Assistant

## 2020-10-28 DIAGNOSIS — I10 Essential (primary) hypertension: Secondary | ICD-10-CM | POA: Diagnosis not present

## 2020-10-28 DIAGNOSIS — F17200 Nicotine dependence, unspecified, uncomplicated: Secondary | ICD-10-CM | POA: Diagnosis not present

## 2020-10-28 DIAGNOSIS — L97312 Non-pressure chronic ulcer of right ankle with fat layer exposed: Secondary | ICD-10-CM | POA: Diagnosis not present

## 2020-10-28 DIAGNOSIS — E11622 Type 2 diabetes mellitus with other skin ulcer: Secondary | ICD-10-CM | POA: Insufficient documentation

## 2020-10-28 DIAGNOSIS — L03115 Cellulitis of right lower limb: Secondary | ICD-10-CM | POA: Diagnosis not present

## 2020-10-28 DIAGNOSIS — I2510 Atherosclerotic heart disease of native coronary artery without angina pectoris: Secondary | ICD-10-CM | POA: Insufficient documentation

## 2020-10-28 NOTE — Progress Notes (Signed)
Jeremy [name], Jeremy [name] ([id number]) Visit Report for 10/28/2020 Abuse/Suicide Risk Screen Details Patient Name: Jeremy [name], Jeremy [name] Date of Service: 10/28/2020 10:00 AM Medical Record Number: [id number] Patient Account Number: [id number] Date of Birth/Sex: April 28, 1956 (64 y.o. M) Treating RN: Hansel Feinstein Primary Care Sergio Hobart: PATIENT, NO Other Clinician: Referring Yoshika Vensel: Vincent Gros Treating Jishnu Jenniges/Extender: Rowan Blase in Treatment: 0 Abuse/Suicide Risk Screen Items Answer ABUSE RISK SCREEN: Has anyone close to you tried to hurt or harm you recentlyo No Do you feel uncomfortable with anyone in your familyo No Has anyone forced you do things that you didnot want to doo No Electronic Signature(s) Signed: 10/28/2020 2:35:55 PM By: Hansel Feinstein Entered By: Hansel Feinstein on 10/28/2020 10:32:42 Jeremy [name] ([id number]) -------------------------------------------------------------------------------- Activities of Daily Living Details Patient Name: Jeremy [name] Date of Service: 10/28/2020 10:00 AM Medical Record Number: [id number] Patient Account Number: [id number] Date of Birth/Sex: [birth year] (64 y.o. M) Treating RN: Hansel Feinstein Primary Care Leontine Radman: PATIENT, NO Other Clinician: Referring Jamaar Howes: Vincent Gros Treating Leola Fiore/Extender: Rowan Blase in Treatment: 0 Activities of Daily Living Items Answer Activities of Daily Living (Please select one for each item) Drive Automobile Completely Able Take Medications Completely Able Use Telephone Completely Able Care for Appearance Completely Able Use Toilet Completely Able Bath / Shower Completely Able Dress Self Completely Able Feed Self Completely Able Walk Completely Able Get In / Out Bed Completely Able Housework Completely Able Prepare Meals Completely Able Handle Money Completely Able Shop for Self Completely Able Electronic Signature(s) Signed: 10/28/2020 2:35:55 PM By: Hansel Feinstein Entered By: Hansel Feinstein on 10/28/2020 10:31:25 Jeremy [name] ([id number]) -------------------------------------------------------------------------------- Education Screening Details Patient Name: Jeremy [name] Date of Service: 10/28/2020 10:00 AM Medical Record Number: [id number] Patient Account Number: [id number] Date of Birth/Sex: 1956/03/28 (64 y.o. M) Treating RN: Hansel Feinstein Primary Care Varonica Siharath: PATIENT, NO Other Clinician: Referring Skylinn Vialpando: Vincent Gros Treating Mahathi Pokorney/Extender: Rowan Blase in Treatment: 0 Primary Learner Assessed: Patient Learning Preferences/Education Level/Primary Language Learning Preference: Explanation Highest Education Level: High School Preferred Language: English Cognitive Barrier Language Barrier: No Translator Needed: No Memory Deficit: No Emotional Barrier: No Cultural/Religious Beliefs Affecting Medical Care: No Physical Barrier Impaired Vision: No Impaired Hearing: No Decreased Hand dexterity: No Knowledge/Comprehension Knowledge Level: Medium Comprehension Level: High Ability to understand written instructions: High Ability to understand verbal instructions: High Motivation Anxiety Level: Calm Cooperation: Cooperative Education Importance: Acknowledges Need Interest in Health Problems: Asks Questions Perception: Coherent Willingness to Engage in Self-Management High Activities: Readiness to Engage in Self-Management High Activities: Electronic Signature(s) Signed: 10/28/2020 2:35:55 PM By: Hansel Feinstein Entered ByHansel Feinstein on 10/28/2020 10:32:02 Jeremy [name] ([id number]) -------------------------------------------------------------------------------- Fall Risk Assessment Details Patient Name: Jeremy [name] Date of Service: 10/28/2020 10:00 AM Medical Record Number: [id number] Patient Account Number: [id number] Date of Birth/Sex: [birth year] (64 y.o. M) Treating RN: Hansel Feinstein Primary Care Rishab Stoudt: PATIENT, NO Other  Clinician: Referring Rapheal Masso: Vincent Gros Treating Saliah Crisp/Extender: Rowan Blase in Treatment: 0 Fall Risk Assessment Items Have you had 2 or more falls in the last 12 monthso 0 No Have you had any fall that resulted in injury in the last 12 monthso 0 No FALLS RISK SCREEN History of falling - immediate or within 3 months 0 No Secondary diagnosis (Do you have 2 or more medical diagnoseso) 0 No Ambulatory aid None/bed rest/wheelchair/nurse 0 Yes Crutches/cane/walker 0 No Furniture 0 No Intravenous therapy Access/Saline/Heparin Lock 0 No Gait/Transferring Normal/ bed rest/ wheelchair 0 Yes Weak (short steps with or without shuffle, stooped but able to lift head while walking, may  0 No seek support from furniture) Impaired (short steps with shuffle, may have difficulty arising from chair, head down, impaired 0 No balance) Mental Status Oriented to own ability 0 Yes Electronic Signature(s) Signed: 10/28/2020 2:35:55 PM By: Hansel Feinstein Entered By: Hansel Feinstein on 10/28/2020 10:32:16 Jeremy [name] ([id number]) -------------------------------------------------------------------------------- Foot Assessment Details Patient Name: Jeremy [name] Date of Service: 10/28/2020 10:00 AM Medical Record Number: [id number] Patient Account Number: [id number] Date of Birth/Sex: Apr 25, 1956 (64 y.o. M) Treating RN: Hansel Feinstein Primary Care Renesme Kerrigan: PATIENT, NO Other Clinician: Referring Evian Derringer: Vincent Gros Treating Kalil Woessner/Extender: Rowan Blase in Treatment: 0 Foot Assessment Items Site Locations + = Sensation present, - = Sensation absent, C = Callus, U = Ulcer R = Redness, W = Warmth, M = Maceration, PU = Pre-ulcerative lesion F = Fissure, S = Swelling, D = Dryness Assessment Right: Left: Other Deformity: No No Prior Foot Ulcer: No No Prior Amputation: No No Charcot Joint: No No Ambulatory Status: Ambulatory Without Help Gait: Steady Electronic  Signature(s) Signed: 10/28/2020 2:35:55 PM By: Hansel Feinstein Entered By: Hansel Feinstein on 10/28/2020 10:34:44 Jeremy [name] ([id number]) -------------------------------------------------------------------------------- Nutrition Risk Screening Details Patient Name: Jeremy [name] Date of Service: 10/28/2020 10:00 AM Medical Record Number: [id number] Patient Account Number: [id number] Date of Birth/Sex: [birth year] (64 y.o. M) Treating RN: Hansel Feinstein Primary Care Runell Kovich: PATIENT, NO Other Clinician: Referring Adline Kirshenbaum: Vincent Gros Treating Chyan Carnero/Extender: Rowan Blase in Treatment: 0 Height (in): 71 Weight (lbs): 262 Body Mass Index (BMI): 36.5 Nutrition Risk Screening Items Score Screening NUTRITION RISK SCREEN: I have an illness or condition that made me change the kind and/or amount of food I eat 0 No I eat fewer than two meals per day 0 No I eat few fruits and vegetables, or milk products 0 No I have three or more drinks of beer, liquor or wine almost every day 0 No I have tooth or mouth problems that make it hard for me to eat 0 No I don't always have enough money to buy the food I need 0 No I eat alone most of the time 0 No I take three or more different prescribed or over-the-counter drugs a day 0 No Without wanting to, I have lost or gained 10 pounds in the last six months 0 No I am not always physically able to shop, cook and/or feed myself 0 No Nutrition Protocols Good Risk Protocol 0 No interventions needed Moderate Risk Protocol High Risk Proctocol Risk Level: Good Risk Score: 0 Electronic Signature(s) Signed: 10/28/2020 2:35:55 PM By: Hansel Feinstein Entered ByHansel Feinstein on 10/28/2020 10:33:05

## 2020-10-28 NOTE — Progress Notes (Signed)
DAREY, HERSHBERGER ([id number]) Visit Report for 10/28/2020 Chief Complaint Document Details Patient Name: Jeremy [name], Jeremy [name] Date of Service: 10/28/2020 10:00 AM Medical Record Number: [id number] Patient Account Number: [id number] Date of Birth/Sex: June 13, 1956 (65 y.o. M) Treating RN: Rogers Blocker Primary Care Provider: PATIENT, NO Other Clinician: Referring Provider: Vincent Gros Treating Provider/Extender: Rowan Blase in Treatment: 0 Information Obtained from: Patient Chief Complaint Right ankle ulcer Electronic SignatureShepards) Signed: 10/28/2020 10:48:55 AM By: Lenda Kelp PA-C Entered By: Lenda Kelp on 10/28/2020 10:48:55 Jeremy [name]) -------------------------------------------------------------------------------- HPI Details Patient Name: Jeremy [name] Date of Service: 10/28/2020 10:00 AM Medical Record Number: [id number] Patient Account Number: [id number] Date of Birth/Sex: 06-09-1956 (65 y.o. M) Treating RN: Rogers Blocker Primary Care Provider: PATIENT, NO Other Clinician: Referring Provider: Vincent Gros Treating Provider/Extender: Rowan Blase in Treatment: 0 History of Present Illness HPI Description: 08/18/2020 upon evaluation today patient presents for initial inspection here in our clinic concerning issues has been having with his right lateral ankle and this has been present for at least a year he tells me. He is a patient of Dr. Orland Jarred. Subsequently Dr. Orland Jarred has since retired hence the patient look this up and is coming here for wound care to see if we can help him out. He did have a screw pushed out of this location and since that time tells me that the screw was removed but nonetheless the hole has remained. Fortunately there does not appear to be any signs of active infection systemically at this point though it does raise the question as to whether or not this might be a bone/hardware infection being that this has been open  for so long. The patient does have a history of diabetes mellitus type 2, coronary artery disease, hypertension, and unfortunately does not seem to be doing as great as I would like to see him regard to his left ankle. 08/25/2020 upon evaluation today patient appears to be doing well with regard to his ankle ulcer compared to last week this is definitely smaller. With that being said it still does probe down to bone/hardware. I still think this can be appropriate for him to see the orthopedic specialist. He has not heard from them yet he tells me. 09/05/2020 upon evaluation today patient appears to be doing extremely well in regard to his wound all things considered. I do not see that this is gotten any worse. Unfortunately also do not think he is gotten any better. We did make a referral to orthopedics to Dr. Victorino Dike specifically but the patient tells me that he has not heard from them. That is unusual as they normally get in touch with patients fairly quickly. I will ask him if he possibly missed a phone call he tells me he will check when he gets home. Nonetheless he does not want to go to [facility] anyway which he did not tell me previously therefore we will get a see about making a referral to Dr. Logan Bores to see what he potentially could recommend for the patient as well I think Dr. Logan Bores is excellent and a good option here. Readmission: 10/28/2020 upon evaluation today patient appears to be doing about the same as when I last saw him. He did not come back for follow-up last time I saw him was 09/05/2020. Basically he tells me that he has been attempting to manage this on his own using the Hydrofera Blue rope. He did get a call from [facility] but they did not actually get  him scheduled due to the fact that the patient told me that he told them he did not wanted to see them he just wanted to come back and see me because he was happy with my care. Nonetheless as I explained to the patient he has  a fractured screw at the site of the wound he also has another screw where there appears to be some loosening of the screw and potential for hardware/bone infection here. Subsequently I think he really does need to see a specialist to see what they can do to help him out I do not believe him to be able to get this healed without taking care of the hardware issues. Electronic SignatureShepards) Signed: 10/28/2020 1:05:07 PM By: Lenda KelpStone III, Turner Kunzman PA-C Entered By: Lenda KelpStone III, Asna Muldrow on 10/28/2020 13:05:07 Jeremy [name], Jeremy [name] ([id number]) -------------------------------------------------------------------------------- Physical Exam Details Patient Name: Jeremy [name], Jeremy [name] Date of Service: 10/28/2020 10:00 AM Medical Record Number: [id number] Patient Account Number: [id number] Date of Birth/Sex: [birth year] (65 y.o. M) Treating RN: Rogers BlockerSanchez, Kenia Primary Care Provider: PATIENT, NO Other Clinician: Referring Provider: Vincent GrosBoscia, Heather Treating Provider/Extender: Rowan BlaseStone, Lache Dagher Weeks in Treatment: 0 Constitutional patient is hypertensive.. pulse regular and within target range for patient.Marland Kitchen. respirations regular, non-labored and within target range for patient.Marland Kitchen. temperature within target range for patient.. Well-nourished and well-hydrated in no acute distress. Eyes conjunctiva clear no eyelid edema noted. pupils equal round and reactive to light and accommodation. Respiratory normal breathing without difficulty. Musculoskeletal normal gait and posture. no significant deformity or arthritic changes, no loss or range of motion, no clubbing. Psychiatric this patient is able to make decisions and demonstrates good insight into disease process. Alert and Oriented x 3. pleasant and cooperative. Notes Upon inspection patient's wound bed actually showed signs again of good granulation externally although right in the base of the wound I feel like actually I am able to probe down and tap on the screw where it is currently in  place. I explained to the patient this is not a good sign and with that being said there is really nothing for the wound to fill in and close over this if indeed he has a screw that is moving around here. Subsequently I think that he does need to see podiatry ASAP to see what can be done from a surgical standpoint I do not want him to end up with a below-knee amputation because this was not taken care of in an appropriate manner. Electronic SignatureShepards) Signed: 10/28/2020 1:05:45 PM By: Lenda KelpStone III, Blake Vetrano PA-C Entered By: Lenda KelpStone III, Asiah Befort on 10/28/2020 13:05:44 Jeremy [name], Jeremy [name] ([id number]) -------------------------------------------------------------------------------- Physician Orders Details Patient Name: Jeremy [name], Mustafa Date of Service: 10/28/2020 10:00 AM Medical Record Number: [id number] Patient Account Number: [id number] Date of Birth/Sex: [birth year] (65 y.o. M) Treating RN: Rogers BlockerSanchez, Kenia Primary Care Provider: PATIENT, NO Other Clinician: Referring Provider: Vincent GrosBoscia, Heather Treating Provider/Extender: Rowan BlaseStone, Annaly Skop Weeks in Treatment: 0 Verbal / Phone Orders: No Diagnosis Coding ICD-10 Coding Code Description L03.115 Cellulitis of right lower limb L97.312 Non-pressure chronic ulcer of right ankle with fat layer exposed E11.622 Type 2 diabetes mellitus with other skin ulcer I10 Essential (primary) hypertension I25.10 Atherosclerotic heart disease of native coronary artery without angina pectoris Follow-up Appointments o Return Appointment in 2 weeks. Bathing/ Shower/ Hygiene o May shower; gently cleanse wound with antibacterial soap, rinse and pat dry prior to dressing wounds Wound Treatment Wound #2 - Lower Leg Wound Laterality: Right, Lateral Cleanser: Normal Saline 3 x Per Week/30 Days Discharge Instructions: Wash your hands with soap and water.  Remove old dressing, discard into plastic bag and place into trash. Cleanse the wound with Normal Saline prior to applying a clean  dressing using gauze sponges, not tissues or cotton balls. Do not scrub or use excessive force. Pat dry using gauze sponges, not tissue or cotton balls. Primary Dressing: Hydrofera Blue Classic Foam Rope Dressing, 9x6 (mm/in) 3 x Per Week/30 Days Discharge Instructions: Cut 1.5 cm in length then cut in half, pack in wound and moisten with couple drops of saline. (extra sent with patient) Secondary Dressing: Telfa Adhesive [place] Dressing, 4x4 (in/in) 3 x Per Week/30 Days Discharge Instructions: Apply over dressing to secure in place. Electronic SignatureShepards) Signed: 10/28/2020 3:02:05 PM By: Phillis Haggis, Dondra Prader RN Signed: 10/28/2020 3:13:06 PM By: Lenda Kelp PA-C Entered By: Phillis Haggis, Dondra Prader on 10/28/2020 11:22:07 Jeremy [name]) -------------------------------------------------------------------------------- Problem List Details Patient Name: Jeremy [name] Date of Service: 10/28/2020 10:00 AM Medical Record Number: [id number] Patient Account Number: [id number] Date of Birth/Sex: 24-Feb-1956 (65 y.o. M) Treating RN: Rogers Blocker Primary Care Provider: PATIENT, NO Other Clinician: Referring Provider: Vincent Gros Treating Provider/Extender: Rowan Blase in Treatment: 0 Active Problems ICD-10 Encounter Code Description Active Date MDM Diagnosis L03.115 Cellulitis of right lower limb 10/28/2020 No Yes L97.312 Non-pressure chronic ulcer of right ankle with fat layer exposed 10/28/2020 No Yes E11.622 Type 2 diabetes mellitus with other skin ulcer 10/28/2020 No Yes I10 Essential (primary) hypertension 10/28/2020 No Yes I25.10 Atherosclerotic heart disease of native coronary artery without angina 10/28/2020 No Yes pectoris Inactive Problems Resolved Problems Electronic SignatureShepards) Signed: 10/28/2020 10:48:39 AM By: Lenda Kelp PA-C Entered By: Lenda Kelp on 10/28/2020 10:48:39 Jeremy [name]  ([id number]) -------------------------------------------------------------------------------- Progress Note Details Patient Name: Jeremy [name] Date of Service: 10/28/2020 10:00 AM Medical Record Number: [id number] Patient Account Number: [id number] Date of Birth/Sex: 09/22/1955 (65 y.o. M) Treating RN: Rogers Blocker Primary Care Provider: PATIENT, NO Other Clinician: Referring Provider: Vincent Gros Treating Provider/Extender: Rowan Blase in Treatment: 0 Subjective Chief Complaint Information obtained from Patient Right ankle ulcer History of Present Illness (HPI) 08/18/2020 upon evaluation today patient presents for initial inspection here in our clinic concerning issues has been having with his right lateral ankle and this has been present for at least a year he tells me. He is a patient of Dr. Orland Jarred. Subsequently Dr. Orland Jarred has since retired hence the patient look this up and is coming here for wound care to see if we can help him out. He did have a screw pushed out of this location and since that time tells me that the screw was removed but nonetheless the hole has remained. Fortunately there does not appear to be any signs of active infection systemically at this point though it does raise the question as to whether or not this might be a bone/hardware infection being that this has been open for so long. The patient does have a history of diabetes mellitus type 2, coronary artery disease, hypertension, and unfortunately does not seem to be doing as great as I would like to see him regard to his left ankle. 08/25/2020 upon evaluation today patient appears to be doing well with regard to his ankle ulcer compared to last week this is definitely smaller. With that being said it still does probe down to bone/hardware. I still think this can be appropriate for him to see the orthopedic specialist. He has not heard from them yet he tells me. 09/05/2020 upon evaluation today patient  appears to be doing extremely  well in regard to his wound all things considered. I do not see that this is gotten any worse. Unfortunately also do not think he is gotten any better. We did make a referral to orthopedics to Dr. Victorino Dike specifically but the patient tells me that he has not heard from them. That is unusual as they normally get in touch with patients fairly quickly. I will ask him if he possibly missed a phone call he tells me he will check when he gets home. Nonetheless he does not want to go to [facility] anyway which he did not tell me previously therefore we will get a see about making a referral to Dr. Logan Bores to see what he potentially could recommend for the patient as well I think Dr. Logan Bores is excellent and a good option here. Readmission: 10/28/2020 upon evaluation today patient appears to be doing about the same as when I last saw him. He did not come back for follow-up last time I saw him was 09/05/2020. Basically he tells me that he has been attempting to manage this on his own using the Hydrofera Blue rope. He did get a call from [facility] but they did not actually get him scheduled due to the fact that the patient told me that he told them he did not wanted to see them he just wanted to come back and see me because he was happy with my care. Nonetheless as I explained to the patient he has a fractured screw at the site of the wound he also has another screw where there appears to be some loosening of the screw and potential for hardware/bone infection here. Subsequently I think he really does need to see a specialist to see what they can do to help him out I do not believe him to be able to get this healed without taking care of the hardware issues. Patient History Information obtained from Patient. Allergies No Known Allergies Social History Current every day smoker - 40 years, Marital Status - Divorced, Alcohol Use - Moderate, Drug Use - Current History, Caffeine  Use - Daily. Medical History Eyes Denies history of Cataracts, Glaucoma, Optic Neuritis Ear/Nose/Mouth/Throat Denies history of Chronic sinus problems/congestion, Middle ear problems Hematologic/Lymphatic Denies history of Anemia, Hemophilia, Human Immunodeficiency Virus, Lymphedema, Sickle Cell Disease Respiratory Denies history of Aspiration, Asthma, Chronic Obstructive Pulmonary Disease (COPD), Pneumothorax, Sleep Apnea, Tuberculosis Cardiovascular Patient has history of Coronary Artery Disease, Hypertension Denies history of Angina, Arrhythmia, Congestive Heart Failure, Deep Vein Thrombosis, Hypotension, Myocardial Infarction, Peripheral Arterial Disease, Peripheral Venous Disease, Phlebitis, Vasculitis Gastrointestinal Denies history of Cirrhosis , Colitis, Crohn s, Hepatitis A, Hepatitis B, Hepatitis C Endocrine Patient has history of Type II Diabetes Genitourinary Denies history of End Stage Renal Disease Immunological Jeremy [name], Jeremy [name] ([id number]) Denies history of Lupus Erythematosus, Raynaud s, Scleroderma Integumentary (Skin) Denies history of History of Burn, History of pressure wounds Musculoskeletal Patient has history of Osteoarthritis, Osteomyelitis Denies history of Gout, Rheumatoid Arthritis Neurologic Denies history of Dementia, Neuropathy, Quadriplegia, Paraplegia, Seizure Disorder Oncologic Denies history of Received Chemotherapy, Received Radiation Psychiatric Denies history of Anorexia/bulimia, Confinement Anxiety Medical And Surgical History Notes Cardiovascular CABGx1 Review of Systems (ROS) Constitutional Symptoms (General Health) Denies complaints or symptoms of Fatigue, Fever, Chills, Marked Weight Change. Eyes Denies complaints or symptoms of Dry Eyes, Vision Changes, Glasses / Contacts. Ear/Nose/Mouth/Throat Denies complaints or symptoms of Difficult clearing ears, Sinusitis. Hematologic/Lymphatic Denies complaints or symptoms of Bleeding /  Clotting Disorders, Human Immunodeficiency Virus. Respiratory Complains or has  symptoms of Shortness of Breath - with exertion. Genitourinary Denies complaints or symptoms of Kidney failure/ Dialysis, Incontinence/dribbling. Immunological Denies complaints or symptoms of Hives, Itching. Integumentary (Skin) Complains or has symptoms of Wounds - hx post op right ankle surgery. Neurologic Denies complaints or symptoms of Numbness/parasthesias, Focal/Weakness. Psychiatric Denies complaints or symptoms of Anxiety, Claustrophobia. Objective Constitutional patient is hypertensive.. pulse regular and within target range for patient.Marland Kitchen respirations regular, non-labored and within target range for patient.Marland Kitchen temperature within target range for patient.. Well-nourished and well-hydrated in no acute distress. Vitals Time Taken: 10:21 AM, Height: 71 in, Source: Stated, Weight: 262 lbs, Source: Measured, BMI: 36.5, Temperature: 97.8 F, Pulse: 115 bpm, Respiratory Rate: 18 breaths/min, Blood Pressure: 148/88 mmHg. Eyes conjunctiva clear no eyelid edema noted. pupils equal round and reactive to light and accommodation. Respiratory normal breathing without difficulty. Musculoskeletal normal gait and posture. no significant deformity or arthritic changes, no loss or range of motion, no clubbing. Psychiatric this patient is able to make decisions and demonstrates good insight into disease process. Alert and Oriented x 3. pleasant and cooperative. General Notes: Upon inspection patient's wound bed actually showed signs again of good granulation externally although right in the base of the wound I feel like actually I am able to probe down and tap on the screw where it is currently in place. I explained to the patient this is not a good sign and with that being said there is really nothing for the wound to fill in and close over this if indeed he has a screw that is moving around here. Subsequently I think  that he does need to see podiatry ASAP to see what can be done from a surgical standpoint I do not want him to end up with a below-knee amputation because this was not taken care of in an appropriate manner. Integumentary (Hair, Skin) Wound #2 status is Open. Original cause of wound was Gradually Appeared. The date acquired was: 07/15/2020. The wound is located on the [place], [place] ([id number]) Right,Lateral Lower Leg. The wound measures 0.4cm length x 0.3cm width x 1.2cm depth; 0.094cm^2 area and 0.113cm^3 volume. There is Fat Layer (Subcutaneous Tissue) exposed. There is no tunneling or undermining noted. There is a medium amount of serosanguineous drainage noted. There is medium (34-66%) red granulation within the wound bed. There is a medium (34-66%) amount of necrotic tissue within the wound bed including Adherent Slough. Assessment Active Problems ICD-10 Cellulitis of right lower limb Non-pressure chronic ulcer of right ankle with fat layer exposed Type 2 diabetes mellitus with other skin ulcer Essential (primary) hypertension Atherosclerotic heart disease of native coronary artery without angina pectoris Plan Follow-up Appointments: Return Appointment in 2 weeks. Bathing/ Shower/ Hygiene: May shower; gently cleanse wound with antibacterial soap, rinse and pat dry prior to dressing wounds WOUND #2: - Lower Leg Wound Laterality: Right, Lateral Cleanser: Normal Saline 3 x Per Week/30 Days Discharge Instructions: Wash your hands with soap and water. Remove old dressing, discard into plastic bag and place into trash. Cleanse the wound with Normal Saline prior to applying a clean dressing using gauze sponges, not tissues or cotton balls. Do not scrub or use excessive force. Pat dry using gauze sponges, not tissue or cotton balls. Primary Dressing: Hydrofera Blue Classic Foam Rope Dressing, 9x6 (mm/in) 3 x Per Week/30 Days Discharge Instructions: Cut 1.5 cm in length then cut in half,  pack in wound and moisten with couple drops of saline. (extra sent with patient) Secondary Dressing: Telfa Adhesive [place]  Dressing, 4x4 (in/in) 3 x Per Week/30 Days Discharge Instructions: Apply over dressing to secure in place. 1. Would recommend currently that we go ahead and initiate treatment with a continuation of the Hydrofera Blue rope although I think he does need to see [facility], Dr. Logan Bores, as soon as possible to try to see if there can be a plan to get this hardware out of his foot and ankle region. 2. I am also can recommend that he change this 3 times per week. 3. I am also going to suggest that the patient should try to call and make that appointment with [facility] as soon as possible I do not want to end up with an amputation because he is not taking care of the situation in an appropriate manner. We will see patient back for reevaluation in 2 weeks here in the clinic. If anything worsens or changes patient will contact our office for additional recommendations. Electronic SignatureShepards) Signed: 10/28/2020 1:06:29 PM By: Lenda Kelp PA-C Entered By: Lenda Kelp on 10/28/2020 13:06:29 Jeremy [name]) -------------------------------------------------------------------------------- ROS/PFSH Details Patient Name: Jeremy [name] Date of Service: 10/28/2020 10:00 AM Medical Record Number: [id number] Patient Account Number: [id number] Date of Birth/Sex: 1956-01-07 (65 y.o. M) Treating RN: Hansel Feinstein Primary Care Provider: PATIENT, NO Other Clinician: Referring Provider: Vincent Gros Treating Provider/Extender: Rowan Blase in Treatment: 0 Information Obtained From Patient Constitutional Symptoms (General Health) Complaints and Symptoms: Negative for: Fatigue; Fever; Chills; Marked Weight Change Eyes Complaints and Symptoms: Negative for: Dry Eyes; Vision Changes; Glasses / Contacts Medical History: Negative for: Cataracts;  Glaucoma; Optic Neuritis Ear/Nose/Mouth/Throat Complaints and Symptoms: Negative for: Difficult clearing ears; Sinusitis Medical History: Negative for: Chronic sinus problems/congestion; Middle ear problems Hematologic/Lymphatic Complaints and Symptoms: Negative for: Bleeding / Clotting Disorders; Human Immunodeficiency Virus Medical History: Negative for: Anemia; Hemophilia; Human Immunodeficiency Virus; Lymphedema; Sickle Cell Disease Respiratory Complaints and Symptoms: Positive for: Shortness of Breath - with exertion Medical History: Negative for: Aspiration; Asthma; Chronic Obstructive Pulmonary Disease (COPD); Pneumothorax; Sleep Apnea; Tuberculosis Genitourinary Complaints and Symptoms: Negative for: Kidney failure/ Dialysis; Incontinence/dribbling Medical History: Negative for: End Stage Renal Disease Immunological Complaints and Symptoms: Negative for: Hives; Itching Medical History: Negative for: Lupus Erythematosus; Raynaudos; Scleroderma Integumentary (Skin) Complaints and Symptoms: Positive for: Wounds - hx post op right ankle surgery Bookwalter, Bluford ([id number]) Medical History: Negative for: History of Burn; History of pressure wounds Neurologic Complaints and Symptoms: Negative for: Numbness/parasthesias; Focal/Weakness Medical History: Negative for: Dementia; Neuropathy; Quadriplegia; Paraplegia; Seizure Disorder Psychiatric Complaints and Symptoms: Negative for: Anxiety; Claustrophobia Medical History: Negative for: Anorexia/bulimia; Confinement Anxiety Cardiovascular Medical History: Positive for: Coronary Artery Disease; Hypertension Negative for: Angina; Arrhythmia; Congestive Heart Failure; Deep Vein Thrombosis; Hypotension; Myocardial Infarction; Peripheral Arterial Disease; Peripheral Venous Disease; Phlebitis; Vasculitis Past Medical History Notes: CABGx1 Gastrointestinal Medical History: Negative for: Cirrhosis ; Colitis; Crohnos;  Hepatitis A; Hepatitis B; Hepatitis C Endocrine Medical History: Positive for: Type II Diabetes Time with diabetes: 2 years Treated with: Oral agents Blood sugar tested every day: No Musculoskeletal Medical History: Positive for: Osteoarthritis; Osteomyelitis Negative for: Gout; Rheumatoid Arthritis Oncologic Medical History: Negative for: Received Chemotherapy; Received Radiation Immunizations Pneumococcal Vaccine: Received Pneumococcal Vaccination: No Implantable Devices None Family and Social History Current every day smoker - 40 years; Marital Status - Divorced; Alcohol Use: Moderate; Drug Use: Current History; Caffeine Use: Daily; Financial Concerns: No; Food, Clothing or Shelter Needs: No; Support System Lacking: No; Transportation Concerns: No Electronic SignatureShepards) Signed: 10/28/2020 2:35:55 PM By:  Hansel Feinstein Signed: 10/28/2020 3:13:06 PM By: Jefm Miles, Jeremy [name] ([id number]) Entered By: Hansel Feinstein on 10/28/2020 10:30:55 Jeremy [name]) -------------------------------------------------------------------------------- SuperBill Details Patient Name: Jeremy [name] Date of Service: 10/28/2020 Medical Record Number: [id number] Patient Account Number: [id number] Date of Birth/Sex: October 10, 1955 (65 y.o. M) Treating RN: Rogers Blocker Primary Care Provider: PATIENT, NO Other Clinician: Referring Provider: Vincent Gros Treating Provider/Extender: Rowan Blase in Treatment: 0 Diagnosis Coding ICD-10 Codes Code Description L03.115 Cellulitis of right lower limb L97.312 Non-pressure chronic ulcer of right ankle with fat layer exposed E11.622 Type 2 diabetes mellitus with other skin ulcer I10 Essential (primary) hypertension I25.10 Atherosclerotic heart disease of native coronary artery without angina pectoris Facility Procedures CPT4 Code: 08657846 Description: 99213 - WOUND CARE VISIT-LEV 3 EST PT Modifier: Quantity: 1 Physician  Procedures CPT4 Code: 9629528 Description: 99213 - WC PHYS LEVEL 3 - EST PT Modifier: Quantity: 1 CPT4 Code: Description: ICD-10 Diagnosis Description L03.115 Cellulitis of right lower limb L97.312 Non-pressure chronic ulcer of right ankle with fat layer exposed E11.622 Type 2 diabetes mellitus with other skin ulcer I10 Essential (primary) hypertension Modifier: Quantity: Electronic SignatureShepards) Signed: 10/28/2020 1:06:42 PM By: Lenda Kelp PA-C Entered By: Lenda Kelp on 10/28/2020 13:06:41

## 2020-10-28 NOTE — Progress Notes (Addendum)
KESLEY, GAFFEY ([id number]) Visit Report for 10/28/2020 Allergy List Details Patient Name: Jeremy [name], Jeremy [name] Date of Service: 10/28/2020 10:00 AM Medical Record Number: [id number] Patient Account Number: [id number] Date of Birth/Sex: Nov 09, 1955 (64 y.o. M) Treating RN: Hansel Feinstein Primary Care Carmellia Kreisler: PATIENT, NO Other Clinician: Referring Kenya Kook: Vincent Gros Treating Bernabe Dorce/Extender: Rowan Blase in Treatment: 0 Allergies Active Allergies No Known Allergies Allergy Notes Electronic Signature(s) Signed: 10/28/2020 2:35:55 PM By: Hansel Feinstein Entered By: Hansel Feinstein on 10/28/2020 10:32:33 Jeremy [name] ([id number]) -------------------------------------------------------------------------------- Arrival Information Details Patient Name: Jeremy [name] Date of Service: 10/28/2020 10:00 AM Medical Record Number: [id number] Patient Account Number: [id number] Date of Birth/Sex: May 30, 1956 (64 y.o. M) Treating RN: Hansel Feinstein Primary Care Nathin Saran: PATIENT, NO Other Clinician: Referring Oluwanifemi Petitti: Vincent Gros Treating Arali Somera/Extender: Rowan Blase in Treatment: 0 Visit Information Patient Arrived: Ambulatory Arrival Time: 10:21 Accompanied By: self Transfer Assistance: None Patient Identification Verified: Yes Secondary Verification Process Completed: Yes History Since Last Visit Electronic Signature(s) Signed: 10/28/2020 2:35:55 PM By: Hansel Feinstein Entered By: Hansel Feinstein on 10/28/2020 10:21:39 Jeremy [name] ([id number]) -------------------------------------------------------------------------------- Clinic Level of Care Assessment Details Patient Name: Jeremy [name] Date of Service: 10/28/2020 10:00 AM Medical Record Number: [id number] Patient Account Number: [id number] Date of Birth/Sex: Oct 08, 1955 (64 y.o. M) Treating RN: Rogers Blocker Primary Care Brenee Gajda: PATIENT, NO Other Clinician: Referring Nickcole Bralley: Vincent Gros Treating  Josue Falconi/Extender: Rowan Blase in Treatment: 0 Clinic Level of Care Assessment Items TOOL 2 Quantity Score X - Use when only an EandM is performed on the INITIAL visit 1 0 ASSESSMENTS - Nursing Assessment / Reassessment X - General Physical Exam (combine w/ comprehensive assessment (listed just below) when performed on new 1 20 pt. evals) X- 1 25 Comprehensive Assessment (HX, ROS, Risk Assessments, Wounds Hx, etc.) ASSESSMENTS - Wound and Skin Assessment / Reassessment X - Simple Wound Assessment / Reassessment - one wound 1 5 []  - 0 Complex Wound Assessment / Reassessment - multiple wounds []  - 0 Dermatologic / Skin Assessment (not related to wound area) ASSESSMENTS - Ostomy and/or Continence Assessment and Care []  - Incontinence Assessment and Management 0 []  - 0 Ostomy Care Assessment and Management (repouching, etc.) PROCESS - Coordination of Care X - Simple Patient / Family Education for ongoing care 1 15 []  - 0 Complex (extensive) Patient / Family Education for ongoing care []  - 0 Staff obtains , Records, Test Results / Process Orders []  - 0 Staff telephones HHA, Nursing Homes / Clarify orders / etc []  - 0 Routine Transfer to another Facility (non-emergent condition) []  - 0 Routine Hospital Admission (non-emergent condition) []  - 0 New Admissions / / Ordering NPWT, Apligraf, etc. []  - 0 Emergency Hospital Admission (emergent condition) X- 1 10 Simple Discharge Coordination []  - 0 Complex (extensive) Discharge Coordination PROCESS - Special Needs []  - Pediatric / Minor Patient Management 0 []  - 0 Isolation Patient Management []  - 0 Hearing / Language / Visual special needs []  - 0 Assessment of Community assistance (transportation, D/C planning, etc.) []  - 0 Additional assistance / Altered mentation []  - 0 Support Surface(s) Assessment (bed, cushion, seat, etc.) INTERVENTIONS - Wound Cleansing / Measurement X - Wound  Imaging (photographs - any number of wounds) 1 5 []  - 0 Wound Tracing (instead of photographs) X- 1 5 Simple Wound Measurement - one wound []  - 0 Complex Wound Measurement - multiple wounds Jeremy [name], Jeremy [name] ( ) X- 1 5 Simple Wound Cleansing - one wound []  - 0 Complex Wound Cleansing - multiple wounds INTERVENTIONS -  Wound Dressings []  - Small Wound Dressing one or multiple wounds 0 X- 1 15 Medium Wound Dressing one or multiple wounds []  - 0 Large Wound Dressing one or multiple wounds []  - 0 Application of Medications - injection INTERVENTIONS - Miscellaneous []  - External ear exam 0 []  - 0 Specimen Collection (cultures, biopsies, blood, body fluids, etc.) []  - 0 Specimen(s) / Culture(s) sent or taken to Lab for analysis []  - 0 Patient Transfer (multiple staff / Lift / Similar devices) []  - 0 Simple Staple / Suture removal (25 or less) []  - 0 Complex Staple / Suture removal (26 or more) []  - 0 Hypo / Hyperglycemic Management (close monitor of Blood Glucose) []  - 0 Ankle / Brachial Index (ABI) - do not check if billed separately Has the patient been seen at the hospital within the last three years: Yes Total Score: 105 Level Of Care: New/Established - Level 3 Electronic Signature(s) Signed: 10/28/2020 3:02:05 PM By: , RN Entered By: , on 10/28/2020 11:23:09 Jeremy [name] ( ) -------------------------------------------------------------------------------- Encounter Discharge Information Details Patient Name: Date of Service: 10/28/2020 10:00 AM Medical Record Number: Patient Account Number: 10/30/2020 Date of Birth/Sex: 02/02/1956 (64 y.o. M) Treating RN: Phillis Haggis Primary Care Yulissa Needham: PATIENT, NO Other Clinician: Referring Woods Gangemi: Dondra Prader Treating Kent Braunschweig/Extender: 10/30/2020 in Treatment: 0 Encounter Discharge Information Items Discharge  Condition: Stable Ambulatory Status: Ambulatory Discharge Destination: Home Transportation: Private Auto Accompanied By: self Schedule Follow-up Appointment: Yes Clinical Summary of Care: Electronic Signature(s) Signed: 10/28/2020 2:43:37 PM By: [id number] Entered By: Jeremy [name] on 10/28/2020 11:35:32 [id number] ([id number]) -------------------------------------------------------------------------------- Lower Extremity Assessment Details Patient Name: Jeremy [name] Date of Service: 10/28/2020 10:00 AM Medical Record Number: Rogers Blocker Patient Account Number: Vincent Gros Date of Birth/Sex: 21-Dec-1955 (64 y.o. M) Treating RN: Lolita Cram Primary Care Jaonna Word: PATIENT, NO Other Clinician: Referring Corinthian Mizrahi: Lolita Cram Treating Mackenzy Grumbine/Extender: 10/30/2020 in Treatment: 0 Edema Assessment Assessed: [Left: No] [Right: Yes] Edema: [Left: N] [Right: o] Calf Left: Right: Point of Measurement: 37 cm From Medial Instep 38 cm Ankle Left: Right: Point of Measurement: 12 cm From Medial Instep 26 cm Knee To Floor Left: Right: From Medial Instep 44 cm Vascular Assessment Pulses: Dorsalis Pedis Palpable: [Right:Yes] Electronic Signature(s) Signed: 10/28/2020 2:35:55 PM By: [id number] Entered By: Jeremy [name] on 10/28/2020 10:36:19 Jeremy [name] ([id number]) -------------------------------------------------------------------------------- Multi Wound Chart Details Patient Name: Jeremy [name] Date of Service: 10/28/2020 10:00 AM Medical Record Number: Hansel Feinstein Patient Account Number: Vincent Gros Date of Birth/Sex: 1955-09-08 (64 y.o. 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 Comorbid History: Coronary Artery Disease, N/A N/A Hypertension, Type II Diabetes, Osteoarthritis, Osteomyelitis Date Acquired: 07/15/2020 N/A N/A Weeks of Treatment: 0 N/A N/A Wound Status: Open N/A N/A Measurements L x W x D (cm) 0.4x0.3x1.2 N/A N/A Area (cm) : 0.094 N/A N/A Volume (cm) : 0.113 N/A N/A Classification: Full Thickness Without Exposed N/A N/A Support Structures Exudate Amount: Medium N/A N/A Exudate Type: Serosanguineous N/A N/A Exudate Color: red, brown N/A N/A Granulation Amount: Medium (34-66%) N/A N/A Granulation  Quality: Red N/A N/A Necrotic Amount: Medium (34-66%) N/A N/A Exposed Structures: Fat Layer (Subcutaneous Tissue): N/A N/A Yes Fascia: No Tendon: No Muscle: No Joint: No Bone: No Epithelialization: None N/A N/A Treatment Notes Electronic Signature(s) Signed: 10/28/2020 3:02:05 PM By: Phillis Haggis, Dondra Prader RN Entered By: Phillis Haggis, Dondra Prader on 10/28/2020 11:10:50 Jeremy [name] ([id number]) -------------------------------------------------------------------------------- Multi-Disciplinary Care Plan Details Patient Name: Jeremy [name] Date of Service: 10/28/2020 10:00 AM Medical Record Number: [id number] Patient Account Number: [id number] Date of Birth/Sex: [birth year] (64 y.o. M) Treating RN: Rogers Blocker Primary Care Douglass Dunshee: PATIENT, NO Other Clinician: Referring Symiah Nowotny: Vincent Gros Treating Waldine Zenz/Extender: Rowan Blase in Treatment: 0 Active Inactive Electronic Signature(s) Signed: 12/05/2020 4:35:01 PM By: Lajean Manes RN Previous Signature: 10/28/2020 3:02:05 PM Version By: Phillis Haggis, Dondra Prader RN Entered By: Phillis Haggis, Dondra Prader on 12/05/2020 16:35:01 Jeremy [name] ([id number]) -------------------------------------------------------------------------------- Pain Assessment Details Patient Name: Jeremy [name] Date of Service: 10/28/2020 10:00 AM Medical Record Number: [id number] Patient Account Number: [id number] Date of Birth/Sex: Nov 26, 1955 (64 y.o. M) Treating RN: Hansel Feinstein Primary Care Amaziah Ghosh: PATIENT, NO Other Clinician: Referring Meshach Perry: Vincent Gros Treating Thao Vanover/Extender: Rowan Blase in Treatment: 0 Active Problems Location of Pain Severity and Description of Pain Patient Has Paino No Site Locations Rate the pain. Current Pain Level: 0 Pain Management and Medication Current Pain Management: Notes none reported at this time Electronic Signature(s) Signed: 10/28/2020 2:35:55 PM By: Hansel Feinstein Entered By: Hansel Feinstein on 10/28/2020 10:21:53 Jeremy [name] ([id number]) -------------------------------------------------------------------------------- Patient/Caregiver Education Details Patient Name: Jeremy [name] Date of Service: 10/28/2020 10:00 AM Medical Record Number: [id number] Patient Account Number: [id number] Date of Birth/Gender: [birth year] (64 y.o. M) Treating RN: Rogers Blocker Primary Care Physician: PATIENT, NO Other Clinician: Referring Physician: Vincent Gros Treating Physician/Extender: Rowan Blase in Treatment: 0 Education Assessment Education Provided To: Patient Education Topics Provided Wound/Skin Impairment: Methods: Explain/Verbal Responses: State content correctly Electronic Signature(s) Signed: 10/28/2020 3:02:05 PM By: Phillis Haggis, Dondra Prader RN Entered By: Phillis Haggis, Dondra Prader on 10/28/2020 11:23:26 Jeremy [name] ([id number]) -------------------------------------------------------------------------------- Wound Assessment Details Patient Name: Jeremy [name] Date of Service: 10/28/2020 10:00 AM Medical Record  Number: [id number] Patient Account Number: [id number] Date of Birth/Sex: [birth year] (64 y.o. M) Treating RN: Hansel Feinstein Primary Care Samanthan Dugo: PATIENT, NO Other Clinician: Referring Milanna Kozlov: Vincent Gros Treating Lety Cullens/Extender: Rowan Blase in Treatment: 0 Wound Status Wound Number: 2 Primary Infection - not elsewhere classified Etiology: Wound Location: Right, Lateral Lower Leg Secondary Diabetic Wound/Ulcer of the Lower Extremity Wounding Event: Gradually Appeared Etiology: Date Acquired: 07/15/2020 Wound Status: Open Weeks Of Treatment: 0 Comorbid Coronary Artery Disease, Hypertension, Type II Clustered Wound: No History: Diabetes, Osteoarthritis, Osteomyelitis Photos Wound Measurements Length: (cm) 0.4 Width: (cm) 0.3 Depth: (cm) 1.2 Area: (cm) 0.094 Volume: (cm) 0.113 % Reduction in Area: % Reduction in Volume: Epithelialization: None Tunneling: No Undermining: No Wound Description Classification: Full Thickness Without Exposed Support Structures Exudate Amount: Medium Exudate Type: Serosanguineous Exudate Color: red, brown Foul Odor After Cleansing: No Slough/Fibrino Yes Wound Bed Granulation Amount: Medium (34-66%) Exposed Structure Granulation Quality: Red Fascia Exposed: No Necrotic Amount: Medium (34-66%) Fat Layer (Subcutaneous Tissue) Exposed: Yes Necrotic Quality: Adherent Slough Tendon Exposed: No Muscle Exposed: No Joint Exposed: No Bone Exposed: No Electronic Signature(s) Signed: 10/28/2020 2:35:55 PM By: Hansel Feinstein Entered By: Hansel Feinstein on 10/28/2020 10:40:25 Jeremy [name] ([id number]) -------------------------------------------------------------------------------- Vitals Details Patient Name: Jeremy [name] Date of Service: 10/28/2020 10:00 AM Medical Record Number: [id number] Patient Account Number: [id number] Date of Birth/Sex: 16-Jun-1955 (64 y.o. M) Treating RN: Hansel Feinstein  Primary Care Rebacca Votaw: PATIENT, NO Other  Clinician: Referring Dhruva Orndoff: Vincent GrosBoscia, Heather Treating Karlina Suares/Extender: Rowan BlaseStone, Hoyt Weeks in Treatment: 0 Vital Signs Time Taken: 10:21 Temperature (F): 97.8 Height (in): 71 Pulse (bpm): 115 Source: Stated Respiratory Rate (breaths/min): 18 Weight (lbs): 262 Blood Pressure (mmHg): 148/88 Source: Measured Reference Range: 80 - 120 mg / dl Body Mass Index (BMI): 36.5 Electronic Signature(s) Signed: 10/28/2020 2:35:55 PM By: Hansel FeinsteinBishop, Joy Entered ByHansel Feinstein: Bishop, Joy on 10/28/2020 10:24:05

## 2020-11-13 ENCOUNTER — Ambulatory Visit: Payer: Medicaid Other | Admitting: Physician Assistant

## 2020-12-22 ENCOUNTER — Inpatient Hospital Stay
Admission: EM | Admit: 2020-12-22 | Discharge: 2021-01-03 | DRG: 163 | Disposition: A | Discharge status: Home / Self Care | Payer: Medicaid Other | Attending: Obstetrics and Gynecology | Admitting: Obstetrics and Gynecology

## 2020-12-22 ENCOUNTER — Other Ambulatory Visit: Payer: Self-pay

## 2020-12-22 ENCOUNTER — Inpatient Hospital Stay: Payer: Medicaid Other

## 2020-12-22 ENCOUNTER — Emergency Department: Payer: Medicaid Other

## 2020-12-22 ENCOUNTER — Encounter: Payer: Self-pay | Admitting: Emergency Medicine

## 2020-12-22 DIAGNOSIS — F17210 Nicotine dependence, cigarettes, uncomplicated: Secondary | ICD-10-CM | POA: Diagnosis present

## 2020-12-22 DIAGNOSIS — Z95828 Presence of other vascular implants and grafts: Secondary | ICD-10-CM

## 2020-12-22 DIAGNOSIS — J9601 Acute respiratory failure with hypoxia: Secondary | ICD-10-CM

## 2020-12-22 DIAGNOSIS — Z951 Presence of aortocoronary bypass graft: Secondary | ICD-10-CM

## 2020-12-22 DIAGNOSIS — G928 Other toxic encephalopathy: Secondary | ICD-10-CM | POA: Diagnosis present

## 2020-12-22 DIAGNOSIS — F1291 Cannabis use, unspecified, in remission: Secondary | ICD-10-CM

## 2020-12-22 DIAGNOSIS — Z20822 Contact with and (suspected) exposure to covid-19: Secondary | ICD-10-CM | POA: Diagnosis present

## 2020-12-22 DIAGNOSIS — F1210 Cannabis abuse, uncomplicated: Secondary | ICD-10-CM | POA: Diagnosis present

## 2020-12-22 DIAGNOSIS — J9602 Acute respiratory failure with hypercapnia: Secondary | ICD-10-CM | POA: Diagnosis present

## 2020-12-22 DIAGNOSIS — J440 Chronic obstructive pulmonary disease with acute lower respiratory infection: Secondary | ICD-10-CM | POA: Diagnosis not present

## 2020-12-22 DIAGNOSIS — J150 Pneumonia due to Klebsiella pneumoniae: Secondary | ICD-10-CM | POA: Diagnosis not present

## 2020-12-22 DIAGNOSIS — Z79899 Other long term (current) drug therapy: Secondary | ICD-10-CM

## 2020-12-22 DIAGNOSIS — J9600 Acute respiratory failure, unspecified whether with hypoxia or hypercapnia: Secondary | ICD-10-CM

## 2020-12-22 DIAGNOSIS — N179 Acute kidney failure, unspecified: Secondary | ICD-10-CM | POA: Diagnosis present

## 2020-12-22 DIAGNOSIS — R451 Restlessness and agitation: Secondary | ICD-10-CM | POA: Diagnosis present

## 2020-12-22 DIAGNOSIS — Z87898 Personal history of other specified conditions: Secondary | ICD-10-CM

## 2020-12-22 DIAGNOSIS — I2694 Multiple subsegmental pulmonary emboli without acute cor pulmonale: Secondary | ICD-10-CM

## 2020-12-22 DIAGNOSIS — I4891 Unspecified atrial fibrillation: Secondary | ICD-10-CM | POA: Diagnosis not present

## 2020-12-22 DIAGNOSIS — I214 Non-ST elevation (NSTEMI) myocardial infarction: Secondary | ICD-10-CM | POA: Diagnosis present

## 2020-12-22 DIAGNOSIS — F1491 Cocaine use, unspecified, in remission: Secondary | ICD-10-CM

## 2020-12-22 DIAGNOSIS — R002 Palpitations: Secondary | ICD-10-CM | POA: Diagnosis present

## 2020-12-22 DIAGNOSIS — R778 Other specified abnormalities of plasma proteins: Secondary | ICD-10-CM | POA: Diagnosis not present

## 2020-12-22 DIAGNOSIS — Z4659 Encounter for fitting and adjustment of other gastrointestinal appliance and device: Secondary | ICD-10-CM

## 2020-12-22 DIAGNOSIS — I2609 Other pulmonary embolism with acute cor pulmonale: Principal | ICD-10-CM | POA: Diagnosis present

## 2020-12-22 DIAGNOSIS — Z9911 Dependence on respirator [ventilator] status: Secondary | ICD-10-CM

## 2020-12-22 DIAGNOSIS — F10239 Alcohol dependence with withdrawal, unspecified: Secondary | ICD-10-CM | POA: Diagnosis not present

## 2020-12-22 DIAGNOSIS — Z978 Presence of other specified devices: Secondary | ICD-10-CM

## 2020-12-22 DIAGNOSIS — Z8249 Family history of ischemic heart disease and other diseases of the circulatory system: Secondary | ICD-10-CM

## 2020-12-22 DIAGNOSIS — Z8673 Personal history of transient ischemic attack (TIA), and cerebral infarction without residual deficits: Secondary | ICD-10-CM

## 2020-12-22 DIAGNOSIS — I5041 Acute combined systolic (congestive) and diastolic (congestive) heart failure: Secondary | ICD-10-CM | POA: Diagnosis present

## 2020-12-22 DIAGNOSIS — R410 Disorientation, unspecified: Secondary | ICD-10-CM | POA: Diagnosis not present

## 2020-12-22 DIAGNOSIS — Z720 Tobacco use: Secondary | ICD-10-CM

## 2020-12-22 DIAGNOSIS — F1910 Other psychoactive substance abuse, uncomplicated: Secondary | ICD-10-CM | POA: Diagnosis not present

## 2020-12-22 DIAGNOSIS — I10 Essential (primary) hypertension: Secondary | ICD-10-CM | POA: Diagnosis present

## 2020-12-22 DIAGNOSIS — J441 Chronic obstructive pulmonary disease with (acute) exacerbation: Secondary | ICD-10-CM | POA: Diagnosis present

## 2020-12-22 DIAGNOSIS — F05 Delirium due to known physiological condition: Secondary | ICD-10-CM | POA: Diagnosis not present

## 2020-12-22 DIAGNOSIS — Z881 Allergy status to other antibiotic agents status: Secondary | ICD-10-CM

## 2020-12-22 DIAGNOSIS — I480 Paroxysmal atrial fibrillation: Secondary | ICD-10-CM | POA: Diagnosis present

## 2020-12-22 DIAGNOSIS — I2699 Other pulmonary embolism without acute cor pulmonale: Secondary | ICD-10-CM

## 2020-12-22 DIAGNOSIS — F1423 Cocaine dependence with withdrawal: Secondary | ICD-10-CM | POA: Diagnosis not present

## 2020-12-22 DIAGNOSIS — I2510 Atherosclerotic heart disease of native coronary artery without angina pectoris: Secondary | ICD-10-CM | POA: Diagnosis present

## 2020-12-22 DIAGNOSIS — I110 Hypertensive heart disease with heart failure: Secondary | ICD-10-CM | POA: Diagnosis present

## 2020-12-22 DIAGNOSIS — Z6832 Body mass index (BMI) 32.0-32.9, adult: Secondary | ICD-10-CM

## 2020-12-22 DIAGNOSIS — E119 Type 2 diabetes mellitus without complications: Secondary | ICD-10-CM | POA: Diagnosis present

## 2020-12-22 DIAGNOSIS — R5381 Other malaise: Secondary | ICD-10-CM | POA: Diagnosis present

## 2020-12-22 DIAGNOSIS — Z880 Allergy status to penicillin: Secondary | ICD-10-CM

## 2020-12-22 DIAGNOSIS — E876 Hypokalemia: Secondary | ICD-10-CM | POA: Diagnosis not present

## 2020-12-22 DIAGNOSIS — Z7984 Long term (current) use of oral hypoglycemic drugs: Secondary | ICD-10-CM

## 2020-12-22 LAB — URINE DRUG SCREEN, QUALITATIVE (ARMC ONLY)
Amphetamines, Ur Screen: NOT DETECTED
Barbiturates, Ur Screen: NOT DETECTED
Benzodiazepine, Ur Scrn: NOT DETECTED
Cannabinoid 50 Ng, Ur ~~LOC~~: POSITIVE — AB
Cocaine Metabolite,Ur ~~LOC~~: POSITIVE — AB
MDMA (Ecstasy)Ur Screen: NOT DETECTED
Methadone Scn, Ur: NOT DETECTED
Opiate, Ur Screen: NOT DETECTED
Phencyclidine (PCP) Ur S: NOT DETECTED
Tricyclic, Ur Screen: NOT DETECTED

## 2020-12-22 LAB — APTT: aPTT: 30 seconds (ref 24–36)

## 2020-12-22 LAB — BRAIN NATRIURETIC PEPTIDE: B Natriuretic Peptide: 298.4 pg/mL — ABNORMAL HIGH (ref 0.0–100.0)

## 2020-12-22 LAB — CBC
HCT: 44.1 % (ref 39.0–52.0)
Hemoglobin: 15.4 g/dL (ref 13.0–17.0)
MCH: 32 pg (ref 26.0–34.0)
MCHC: 34.9 g/dL (ref 30.0–36.0)
MCV: 91.7 fL (ref 80.0–100.0)
Platelets: 252 10*3/uL (ref 150–400)
RBC: 4.81 MIL/uL (ref 4.22–5.81)
RDW: 13.2 % (ref 11.5–15.5)
WBC: 6.6 10*3/uL (ref 4.0–10.5)
nRBC: 0 % (ref 0.0–0.2)

## 2020-12-22 LAB — GLUCOSE, CAPILLARY: Glucose-Capillary: 220 mg/dL — ABNORMAL HIGH (ref 70–99)

## 2020-12-22 LAB — RESP PANEL BY RT-PCR (FLU A&B, COVID) ARPGX2
Influenza A by PCR: NEGATIVE
Influenza B by PCR: NEGATIVE
SARS Coronavirus 2 by RT PCR: NEGATIVE

## 2020-12-22 LAB — D-DIMER, QUANTITATIVE: D-Dimer, Quant: 2.91 ug/mL-FEU — ABNORMAL HIGH (ref 0.00–0.50)

## 2020-12-22 LAB — PROTIME-INR
INR: 1 (ref 0.8–1.2)
Prothrombin Time: 13 seconds (ref 11.4–15.2)

## 2020-12-22 LAB — BASIC METABOLIC PANEL
Anion gap: 7 (ref 5–15)
BUN: 13 mg/dL (ref 8–23)
CO2: 21 mmol/L — ABNORMAL LOW (ref 22–32)
Calcium: 9.2 mg/dL (ref 8.9–10.3)
Chloride: 111 mmol/L (ref 98–111)
Creatinine, Ser: 1.04 mg/dL (ref 0.61–1.24)
GFR, Estimated: >60 mL/min (ref >60)
Glucose, Bld: 136 mg/dL — ABNORMAL HIGH (ref 70–99)
Potassium: 4.1 mmol/L (ref 3.5–5.1)
Sodium: 139 mmol/L (ref 135–145)

## 2020-12-22 LAB — TROPONIN I (HIGH SENSITIVITY)
Troponin I (High Sensitivity): 74 ng/L — ABNORMAL HIGH (ref <18)
Troponin I (High Sensitivity): 76 ng/L — ABNORMAL HIGH (ref <18)

## 2020-12-22 LAB — HEPARIN LEVEL (UNFRACTIONATED): Heparin Unfractionated: 0.37 IU/mL (ref 0.30–0.70)

## 2020-12-22 LAB — PROCALCITONIN: Procalcitonin: <0.1 ng/mL

## 2020-12-22 MED ORDER — HEPARIN (PORCINE) 25000 UT/250ML-% IV SOLN
1850.0000 [IU]/h | INTRAVENOUS | Status: AC
  Administered 2020-12-22 – 2020-12-24 (×2): 1350 [IU]/h via INTRAVENOUS
  Administered 2020-12-25 – 2020-12-26 (×3): 1550 [IU]/h via INTRAVENOUS
  Administered 2020-12-27 (×2): 1450 [IU]/h via INTRAVENOUS
  Administered 2020-12-28: 1550 [IU]/h via INTRAVENOUS
  Administered 2020-12-29 – 2020-12-31 (×5): 1850 [IU]/h via INTRAVENOUS
  Filled 2020-12-22 (×15): qty 250

## 2020-12-22 MED ORDER — INSULIN ASPART 100 UNIT/ML IJ SOLN
0.0000 [IU] | Freq: Every day | INTRAMUSCULAR | Status: DC
Start: 2020-12-22 — End: 2020-12-26
  Administered 2020-12-22: 2 [IU] via SUBCUTANEOUS
  Filled 2020-12-22: qty 1

## 2020-12-22 MED ORDER — ACETAMINOPHEN 325 MG PO TABS
650.0000 mg | ORAL_TABLET | Freq: Four times a day (QID) | ORAL | Status: DC | PRN
  Administered 2020-12-23 – 2020-12-24 (×3): 650 mg via ORAL
  Filled 2020-12-22 (×3): qty 2

## 2020-12-22 MED ORDER — ACETAMINOPHEN 650 MG RE SUPP: 650.0000 mg | Freq: Four times a day (QID) | RECTAL | Status: DC | PRN

## 2020-12-22 MED ORDER — METOPROLOL TARTRATE 25 MG PO TABS
25.0000 mg | ORAL_TABLET | Freq: Two times a day (BID) | ORAL | Status: DC
  Administered 2020-12-22 – 2020-12-24 (×4): 25 mg via ORAL
  Filled 2020-12-22 (×4): qty 1

## 2020-12-22 MED ORDER — HYDROCODONE-ACETAMINOPHEN 5-325 MG PO TABS
1.0000 | ORAL_TABLET | ORAL | Status: DC | PRN
  Administered 2020-12-23: 2 via ORAL
  Administered 2020-12-23: 1 via ORAL
  Administered 2020-12-24: 2 via ORAL
  Filled 2020-12-22: qty 2
  Filled 2020-12-22: qty 1
  Filled 2020-12-22: qty 2

## 2020-12-22 MED ORDER — ONDANSETRON HCL 4 MG/2ML IJ SOLN: 4.0000 mg | Freq: Four times a day (QID) | INTRAMUSCULAR | Status: DC | PRN

## 2020-12-22 MED ORDER — ONDANSETRON HCL 4 MG PO TABS: 4.0000 mg | ORAL_TABLET | Freq: Four times a day (QID) | ORAL | Status: DC | PRN

## 2020-12-22 MED ORDER — METOPROLOL TARTRATE 25 MG PO TABS: 25.0000 mg | ORAL_TABLET | Freq: Two times a day (BID) | ORAL | Status: DC

## 2020-12-22 MED ORDER — IOHEXOL 350 MG/ML SOLN
100.0000 mL | Freq: Once | INTRAVENOUS | Status: AC | PRN
  Administered 2020-12-22: 75 mL via INTRAVENOUS

## 2020-12-22 MED ORDER — ASPIRIN 81 MG PO CHEW
324.0000 mg | CHEWABLE_TABLET | Freq: Once | ORAL | Status: AC
  Administered 2020-12-22: 324 mg via ORAL
  Filled 2020-12-22: qty 4

## 2020-12-22 MED ORDER — HEPARIN BOLUS VIA INFUSION
4000.0000 [IU] | Freq: Once | INTRAVENOUS | Status: AC
  Administered 2020-12-22: 4000 [IU] via INTRAVENOUS
  Filled 2020-12-22: qty 4000

## 2020-12-22 MED ORDER — SENNOSIDES-DOCUSATE SODIUM 8.6-50 MG PO TABS: 1.0000 | ORAL_TABLET | Freq: Every evening | ORAL | Status: DC | PRN

## 2020-12-22 MED ORDER — INSULIN ASPART 100 UNIT/ML IJ SOLN: 0.0000 [IU] | Freq: Three times a day (TID) | INTRAMUSCULAR | Status: DC

## 2020-12-22 MED ORDER — INSULIN ASPART 100 UNIT/ML IJ SOLN
0.0000 [IU] | Freq: Three times a day (TID) | INTRAMUSCULAR | Status: DC
  Administered 2020-12-23 (×3): 3 [IU] via SUBCUTANEOUS
  Administered 2020-12-24: 5 [IU] via SUBCUTANEOUS
  Administered 2020-12-24 – 2020-12-25 (×4): 3 [IU] via SUBCUTANEOUS
  Administered 2020-12-26 (×2): 2 [IU] via SUBCUTANEOUS
  Filled 2020-12-22 (×10): qty 1

## 2020-12-22 MED ORDER — METOPROLOL TARTRATE 5 MG/5ML IV SOLN
2.5000 mg | Freq: Once | INTRAVENOUS | Status: AC
  Administered 2020-12-22: 2.5 mg via INTRAVENOUS
  Filled 2020-12-22: qty 5

## 2020-12-22 NOTE — ED Notes (Signed)
Informed RN bed assigned 

## 2020-12-22 NOTE — Progress Notes (Addendum)
Patient tachycardic with HR in the 120s. B/P 122/91. Notified Dr. Para March, orders received, see MAR. Will continue to monitor.   0020 Patient's HR 120, BP 123/89, respiratory rate 25. Patient appears tachypneic but oxygen saturation is 99% on room air. Patient voices no complaints. Notified Dr. Para March, no new orders received. Will continue to monitor.

## 2020-12-22 NOTE — H&P (Signed)
History and Physical    Jeremy [name]:[id number] DOB: [birth year] DOA: 12/22/2020  PCP: Patient, No Pcp Per (Inactive) (Confirm with patient/family/NH records and if not entered, this has to be entered at [facility] point of entry) Patient coming from: Home  I have personally briefly reviewed patient's old medical records in [place]  Chief Complaint: SOB, palpitations  HPI: Jeremy [name] is a 65 y.o. male with medical history significant of CAD status post CABG, HTN, IIDM, came with new onset of palpitations and shortness of breath.  His symptoms started 1 week ago, gradually getting worse.  Denies any chest pain, no fever or chills.  Symptoms got worse with activity, meantime he also developed mild cough with whitish phlegm.  Denies any recent travels, small 1 pack cigarettes per week. ED Course: CTA positive for acute moderate clot burden bilateral pulmonary emboli no saddle embolus.  Mild to moderate cardiomegaly.  No signs of right heart strain.  Normal RV/LV ratio.  Review of Systems: As per HPI otherwise 14 point review of systems negative.    Past Medical History:  Diagnosis Date   Coronary artery disease    Diabetes mellitus without complication ([place])    Hypertension    S/P CABG x 1     Past Surgical History:  Procedure Laterality Date   ANKLE ARTHROSCOPY W/ OPEN REPAIR Right    CORONARY ARTERY BYPASS GRAFT       reports that he has been smoking cigarettes. He has been smoking an average of 0.50 packs per day. He has quit using smokeless tobacco. He reports previous alcohol use. He reports current drug use. Drug: Marijuana.  Allergies  Allergen Reactions   Amoxicillin Swelling   Keflex [Cephalexin] Rash    Family History  Problem Relation Age of Onset   Heart attack Father      Prior to Admission medications   Medication Sig Start Date End Date Taking? Authorizing Provider  metFORMIN (GLUCOPHAGE) 1000 MG tablet Take 1 tablet (1,000 mg total) by mouth  2 (two) times daily with a meal. 08/21/19  Yes Dhungel, Nishant, MD  naproxen sodium (ALEVE) 220 MG tablet Take 440 mg by mouth daily as needed.   Yes [provider]  blood glucose meter kit and supplies KIT Dispense based on patient and insurance preference. Use up to four times daily as directed. (FOR ICD-9 250.00, 250.01). 08/21/19   Dhungel, Nishant, MD  glimepiride (AMARYL) 2 MG tablet Take 1 tablet (2 mg total) by mouth daily with breakfast. Patient not taking: Reported on 12/22/2020 08/21/19   Dhungel, Flonnie Overman, MD  hydrOXYzine (ATARAX/VISTARIL) 25 MG tablet Take 25 mg by mouth 3 (three) times daily as needed for itching. Patient not taking: Reported on 12/22/2020    [provider]  linezolid (ZYVOX) 600 MG tablet Take 1 tablet (600 mg total) by mouth 2 (two) times daily. Patient not taking: Reported on 12/22/2020 10/18/19   Tsosie Billing, MD  losartan (COZAAR) 50 MG tablet Take 1 tablet (50 mg total) by mouth daily. Patient not taking: Reported on 12/22/2020 08/21/19   Dhungel, Flonnie Overman, MD  sitaGLIPtin (JANUVIA) 100 MG tablet Take 1 tablet (100 mg total) by mouth daily. Patient not taking: Reported on 12/22/2020 08/21/19   Louellen Molder, MD    Physical Exam: Vitals:   12/22/20 1440 12/22/20 1500 12/22/20 1530 12/22/20 1715  BP:  123/78 114/74 (!) 123/94  Pulse: (!) 127 (!) 127 (!) 126 (!) 124  Resp: (!) 26 (!) 23 (!) 29 Marland Kitchen)  30  Temp:      TempSrc:      SpO2: 96% 96% 98% 95%  Weight:      Height:        Constitutional: NAD, calm, comfortable Vitals:   12/22/20 1440 12/22/20 1500 12/22/20 1530 12/22/20 1715  BP:  123/78 114/74 (!) 123/94  Pulse: (!) 127 (!) 127 (!) 126 (!) 124  Resp: (!) 26 (!) 23 (!) 29 (!) 30  Temp:      TempSrc:      SpO2: 96% 96% 98% 95%  Weight:      Height:       Eyes: PERRL, lids and conjunctivae normal ENMT: Mucous membranes are moist. Posterior pharynx clear of any exudate or lesions.Normal dentition.  Neck: normal, supple, no  masses, no thyromegaly Respiratory: clear to auscultation bilaterally, no wheezing, no crackles. Normal respiratory effort. No accessory muscle use.  Cardiovascular: Regular rate and rhythm, no murmurs / rubs / gallops. No extremity edema. 2+ pedal pulses. No carotid bruits.  Abdomen: no tenderness, no masses palpated. No hepatosplenomegaly. Bowel sounds positive.  Musculoskeletal: no clubbing / cyanosis. No joint deformity upper and lower extremities. Good ROM, no contractures. Normal muscle tone.  Skin: no rashes, lesions, ulcers. No induration Neurologic: CN 2-12 grossly intact. Sensation intact, DTR normal. Strength 5/5 in all 4.  Psychiatric: Normal judgment and insight. Alert and oriented x 3. Normal mood.     Labs on Admission: I have personally reviewed following labs and imaging studies  CBC: Recent Labs  Lab 12/22/20 1421  WBC 6.6  HGB 15.4  HCT 44.1  MCV 91.7  PLT 902   Basic Metabolic Panel: Recent Labs  Lab 12/22/20 1421  NA 139  K 4.1  CL 111  CO2 21*  GLUCOSE 136*  BUN 13  CREATININE 1.04  CALCIUM 9.2   GFR: Estimated Creatinine Clearance: 94.9 mL/min (by C-G formula based on SCr of 1.04 mg/dL). Liver Function Tests: No results for input(s): AST, ALT, ALKPHOS, BILITOT, PROT, ALBUMIN in the last 168 hours. No results for input(s): LIPASE, AMYLASE in the last 168 hours. No results for input(s): AMMONIA in the last 168 hours. Coagulation Profile: Recent Labs  Lab 12/22/20 1500  INR 1.0   Cardiac Enzymes: No results for input(s): CKTOTAL, CKMB, CKMBINDEX, TROPONINI in the last 168 hours. BNP (last 3 results) No results for input(s): PROBNP in the last 8760 hours. HbA1C: No results for input(s): HGBA1C in the last 72 hours. CBG: No results for input(s): GLUCAP in the last 168 hours. Lipid Profile: No results for input(s): CHOL, HDL, LDLCALC, TRIG, CHOLHDL, LDLDIRECT in the last 72 hours. Thyroid Function Tests: No results for input(s): TSH,  T4TOTAL, FREET4, T3FREE, THYROIDAB in the last 72 hours. Anemia Panel: No results for input(s): VITAMINB12, FOLATE, FERRITIN, TIBC, IRON, RETICCTPCT in the last 72 hours. Urine analysis:    Component Value Date/Time   COLORURINE STRAW (A) 08/19/2019 1118   APPEARANCEUR CLEAR (A) 08/19/2019 1118   LABSPEC 1.022 08/19/2019 1118   PHURINE 5.0 08/19/2019 1118   GLUCOSEU >=500 (A) 08/19/2019 1118   HGBUR NEGATIVE 08/19/2019 1118   BILIRUBINUR NEGATIVE 08/19/2019 1118   KETONESUR 80 (A) 08/19/2019 1118   PROTEINUR NEGATIVE 08/19/2019 1118   NITRITE NEGATIVE 08/19/2019 1118   LEUKOCYTESUR NEGATIVE 08/19/2019 1118    Radiological Exams on Admission: DG Chest 2 View  Result Date: 12/22/2020 CLINICAL DATA:  Dyspnea, palpitations EXAM: CHEST - 2 VIEW COMPARISON:  02/12/2018 chest radiograph. FINDINGS: Intact sternotomy wires. Stable  cardiomediastinal silhouette with mild cardiomegaly. No pneumothorax. No pleural effusion. Mild pulmonary edema. No acute consolidative airspace disease. IMPRESSION: Mild congestive heart failure. Electronically Signed   By: Ilona Sorrel M.D.   On: 12/22/2020 14:54   CT Angio Chest PE W and/or Wo Contrast  Result Date: 12/22/2020 CLINICAL DATA:  Palpitations, chronic cough EXAM: CT ANGIOGRAPHY CHEST WITH CONTRAST TECHNIQUE: Multidetector CT imaging of the chest was performed using the standard protocol during bolus administration of intravenous contrast. Multiplanar CT image reconstructions and MIPs were obtained to evaluate the vascular anatomy. CONTRAST:  73m OMNIPAQUE IOHEXOL 350 MG/ML SOLN COMPARISON:  Chest radiograph from earlier today. 10/02/2011 chest CT. FINDINGS: Cardiovascular: The study is high quality for the evaluation of pulmonary embolism. No saddle embolus. Acute moderate clot burden bilateral pulmonary emboli including segmental and subsegmental left upper and left lower lobe branches, distal right pulmonary artery and lobar, segmental and subsegmental  right upper lobe branches. Atherosclerotic nonaneurysmal thoracic aorta. Top-normal caliber main pulmonary artery (3.1 cm diameter). Mild-to-moderate cardiomegaly. No significant pericardial fluid/thickening. RV/LV ratio 0.76, within normal limits. Mediastinum/Nodes: No discrete thyroid nodules. Unremarkable esophagus. No pathologically enlarged axillary, mediastinal or hilar lymph nodes. Lungs/Pleura: No pneumothorax. No pleural effusion. A small portion of the dependent right lung base was inadvertently excluded from the scan. Posterior right middle lobe 1.0 cm solid pulmonary nodule (series 6/image 62), not substantially changed since 02/10/2018 CT abdomen study, considered benign. No acute consolidative airspace disease, lung masses or additional significant pulmonary nodules. Upper abdomen: Mild contrast reflux into the IVC and hepatic veins. Musculoskeletal: No aggressive appearing focal osseous lesions. Intact sternotomy wires. Mild thoracic spondylosis. Review of the MIP images confirms the above findings. IMPRESSION: 1. Acute moderate clot burden bilateral pulmonary emboli, extending proximally to the distal right pulmonary artery level. No saddle embolus. Normal RV/LV ratio. 2. Mild-to-moderate cardiomegaly. Mild contrast reflux into the IVC and hepatic veins. 3. Aortic Atherosclerosis (ICD10-I70.0). Critical Value/emergent results were called by telephone at the time of interpretation on 12/22/2020 at 4:57 pm to provider EValora Piccolo MD, who verbally acknowledged these results. Electronically Signed   By: JIlona SorrelM.D.   On: 12/22/2020 17:00    EKG: Independently reviewed. Pending.  Assessment/Plan Active Problems:   Pulmonary emboli (HCC)  (please populate well all problems here in Problem List. (For example, if patient is on BP meds at home and you resume or decide to hold them, it is a problem that needs to be her. Same for CAD, COPD, HLD and so on)  B/L PE -No right heart strain, no  hypoxia. -Considered to be unprovoked. -Heparin drip for now, echocardiogram and DVT study, expect switch to p.o. anticoagulation in 1 to 2 days.  Outpatient hematology follow-up for hyper coagulable state work-up.  Explained plan to the patient who expressed understanding.  IIDM -Hold metformin for 72 hours -Sliding scale for now  Positive troponins -Trending pattern is flat, ACS unlikely. -Likely from demand ischemia secondary to PE and tachycardia -Outpatient follow-up with cardiology.  CAD -As above.  DVT prophylaxis: Heparin drip Code Status: Full Family Communication: None at bedside Disposition Plan: Expect 1 to 2 days hospital stay Consults called: None Admission status: Telemetry admission   PLequita HaltMD [facility] Pager [phone] 12/22/2020, 5:55 PM

## 2020-12-22 NOTE — Progress Notes (Signed)
ANTICOAGULATION CONSULT NOTE  Pharmacy Consult for heparin Indication: chest pain/ACS  Allergies  Allergen Reactions   Amoxicillin Swelling   Keflex [Cephalexin] Rash    Patient Measurements: Height: 5\' 11"  (180.3 cm) Weight: 117.9 kg (260 lb) IBW/kg (Calculated) : 75.3 Heparin Dosing Weight: 101 kg  Vital Signs: Temp: 97.9 F (36.6 C) (07/11 1356) Temp Source: Oral (07/11 1356) BP: 123/78 (07/11 1500) Pulse Rate: 127 (07/11 1500)  Labs: Recent Labs    12/22/20 1421  HGB 15.4  HCT 44.1  PLT 252  CREATININE 1.04  TROPONINIHS 74*    Estimated Creatinine Clearance: 94.9 mL/min (by C-G formula based on SCr of 1.04 mg/dL).   Medical History: Past Medical History:  Diagnosis Date   Coronary artery disease    Diabetes mellitus without complication (HCC)    Hypertension    S/P CABG x 1     Assessment: 65 year old male presented with SOB. Elevation in troponin concerning for NSTEMI. Patient to start on heparin drip.  Goal of Therapy:  Heparin level 0.3-0.7 units/ml Monitor platelets by anticoagulation protocol: Yes   Plan:  Heparin 4000 unit bolus Heparin infusion at 1350 units/hr Check HL at 2300 CBC daily while on heparin infusion  65, PharmD 12/22/2020,3:46 PM

## 2020-12-22 NOTE — ED Triage Notes (Signed)
C/O SOB and palpitations x 1 week.  States SOB worse this morning.  AAOx3.  Skin warm and dry. DOE noted.

## 2020-12-22 NOTE — ED Provider Notes (Signed)
[facility] Emergency Department Provider Note  ____________________________________________   Event Date/Time   First MD Initiated Contact with Patient 12/22/20 1413     (approximate)  I have reviewed the triage vital signs and the nursing notes.   HISTORY  Chief Complaint Shortness of Breath   HPI Jeremy [name] is a 65 y.o. male with past medical history of CAD status post CABG, HTN, DM and osteoarthritis of the right ankle followed by wound care for pulmonary wound as well as remote [place] abuse who presents for assessment approximately 3 days of palpitations.  Patient Dors is a chronic cough that does not seem to change at all last couple days.  He denies any chest pain, Donnell pain, back pain, headache or earache, sore throat, nausea, vomiting, diarrhea, dysuria, rash or other acute sick symptoms.  He does endorse tobacco abuse and using some THC over the weekend but states it has been at least 2 weeks since he last using cocaine.         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 2 diabetes mellitus ([place]) 08/21/2019   Hyponatremia 08/19/2019   AKI (acute kidney injury) ([place]) 08/19/2019   Essential hypertension 08/19/2019   Medical non-compliance 08/19/2019   Post-traumatic osteoarthritis of right ankle 10/23/2018   Skin ulcer of right ankle with fat layer exposed ([place]) 10/23/2018   Status post ORIF of fracture of ankle 10/23/2018   Diabetic foot infection ([place]) 10/22/2018    Pressure injury of skin 02/11/2018    Past Surgical History:  Procedure Laterality Date   ANKLE ARTHROSCOPY W/ OPEN REPAIR Right    CORONARY ARTERY BYPASS GRAFT      Prior to Admission medications   Medication Sig Start Date End Date Taking? Authorizing Provider  blood glucose meter kit and supplies KIT Dispense based on patient and insurance preference. Use up to four times daily as directed. (FOR ICD-9 250.00, 250.01). 08/21/19   Dhungel, Nishant, MD  doxycycline (VIBRAMYCIN) 100 MG capsule Take 100 mg by mouth 2 (two) times daily.    [provider]  glimepiride (AMARYL) 2 MG tablet Take 1 tablet (2 mg total) by mouth daily with breakfast. 08/21/19   Dhungel, Nishant, MD  hydrOXYzine (ATARAX/VISTARIL) 25 MG tablet Take 25 mg by mouth 3 (three) times daily as needed for itching.    [provider]  linezolid (ZYVOX) 600 MG tablet Take 1 tablet (600 mg total) by mouth 2 (two) times daily. 10/18/19   Tsosie Billing, MD  losartan (COZAAR) 50 MG tablet Take 1 tablet (50 mg total) by mouth daily. 08/21/19   Dhungel, Flonnie Overman, MD  metFORMIN (GLUCOPHAGE) 1000 MG tablet Take 1 tablet (1,000 mg total) by mouth 2 (two) times daily with a meal. 08/21/19   Dhungel, Nishant, MD  permethrin (ELIMITE) 5 % cream Apply 1 application topically once. Apply to soles of feet. Rinse after 8 hours and apply again x one week.    [provider]  sitaGLIPtin (JANUVIA) 100  MG tablet Take 1 tablet (100 mg total) by mouth daily. 08/21/19   Dhungel, Flonnie Overman, MD    Allergies Amoxicillin and Keflex [cephalexin]  Family History  Problem Relation Age of Onset   Heart attack Father     Social History Social History   Tobacco Use   Smoking status: Some Days    Packs/day: 0.50    Pack years: 0.00    Types: Cigarettes   Smokeless tobacco: Former  Scientific laboratory technician Use: Never used  Substance Use Topics   Alcohol use: Not Currently   Drug use: Yes    Types: Marijuana    Review of  Systems  Review of Systems  Constitutional:  Negative for chills and fever.  HENT:  Negative for sore throat.   Eyes:  Negative for pain.  Respiratory:  Positive for shortness of breath. Negative for cough and stridor.   Cardiovascular:  Positive for palpitations. Negative for chest pain.  Gastrointestinal:  Negative for vomiting.  Genitourinary:  Negative for dysuria.  Musculoskeletal:  Negative for myalgias.  Skin:  Negative for rash.  Neurological:  Negative for seizures, loss of consciousness and headaches.  Psychiatric/Behavioral:  Negative for suicidal ideas.   All other systems reviewed and are negative.    ____________________________________________   PHYSICAL EXAM:  VITAL SIGNS: ED Triage Vitals  Enc Vitals Group     BP 12/22/20 1357 (!) 131/115     Pulse Rate 12/22/20 1357 (!) 128     Resp 12/22/20 1356 (!) 22     Temp 12/22/20 1356 97.9 F (36.6 C)     Temp Source 12/22/20 1356 Oral     SpO2 12/22/20 1357 92 %     Weight 12/22/20 1353 260 lb (117.9 kg)     Height 12/22/20 1353 _0  (1.803 m)     Head Circumference --      Peak Flow --      Pain Score 12/22/20 1352 0     Pain Loc --      Pain Edu? --      Excl. in [place]? --    Vitals:   12/22/20 1440 12/22/20 1500  BP:  123/78  Pulse: (!) 127 (!) 127  Resp: (!) 26 (!) 23  Temp:    SpO2: 96% 96%   Physical Exam Vitals and nursing note reviewed.  Constitutional:      Appearance: He is well-developed.  HENT:     Head: Normocephalic and atraumatic.     Right Ear: External ear normal.     Left Ear: External ear normal.     Nose: Nose normal.  Eyes:     Conjunctiva/sclera: Conjunctivae normal.  Cardiovascular:     Rate and Rhythm: Regular rhythm. Tachycardia present.     Heart sounds: No murmur heard. Pulmonary:     Effort: Pulmonary effort is normal. Tachypnea present. No respiratory distress.     Breath sounds: Normal breath sounds.  Abdominal:     Palpations: Abdomen is soft.     Tenderness:  There is no abdominal tenderness.  Musculoskeletal:     Cervical back: Neck supple.  Skin:    General: Skin is warm and dry.     Capillary Refill: Capillary refill takes less than 2 seconds.  Neurological:     Mental Status: He is alert and oriented to person, place, and time.  Psychiatric:        Mood and Affect: Mood normal.     ____________________________________________   LABS (  all labs ordered are listed, but only abnormal results are displayed)  Labs Reviewed  BASIC METABOLIC PANEL - Abnormal; Notable for the following components:      Result Value   CO2 21 (*)    Glucose, Bld 136 (*)    All other components within normal limits  D-DIMER, QUANTITATIVE - Abnormal; Notable for the following components:   D-Dimer, Quant 2.91 (*)    All other components within normal limits  TROPONIN I (HIGH SENSITIVITY) - Abnormal; Notable for the following components:   Troponin I (High Sensitivity) 74 (*)    All other components within normal limits  RESP PANEL BY RT-PCR (FLU A&B, COVID) ARPGX2  CBC  PROCALCITONIN  BRAIN NATRIURETIC PEPTIDE  URINE DRUG SCREEN, QUALITATIVE ([facility] ONLY)   ____________________________________________  EKG  Sinus tachycardia with a ventricular of 127, normal axis, unremarkable intervals with some T wave inversions nonspecific changes in lateral leads and some nonspecific changes in anterior leads without other clear evidence of acute ischemia. ____________________________________________  RADIOLOGY  ED MD interpretation: Chest x-ray has mild congestive heart failure pulm edema without focal consolidation, effusion or pneumothorax.  Official radiology report(s): DG Chest 2 View  Result Date: 12/22/2020 CLINICAL DATA:  Dyspnea, palpitations EXAM: CHEST - 2 VIEW COMPARISON:  02/12/2018 chest radiograph. FINDINGS: Intact sternotomy wires. Stable cardiomediastinal silhouette with mild cardiomegaly. No pneumothorax. No pleural effusion. Mild pulmonary  edema. No acute consolidative airspace disease. IMPRESSION: Mild congestive heart failure. Electronically Signed   By: Ilona Sorrel M.D.   On: 12/22/2020 14:54    ____________________________________________   PROCEDURES  Procedure(s) performed (including Critical Care):  .Critical Care  Date/Time: 12/22/2020 3:36 PM Performed by: Lucrezia Starch, MD Authorized by: Lucrezia Starch, MD   Critical care provider statement:    Critical care time (minutes):  45   Critical care was necessary to treat or prevent imminent or life-threatening deterioration of the following conditions:  Cardiac failure   Critical care was time spent personally by me on the following activities:  Discussions with consultants, evaluation of patient's response to treatment, examination of patient, ordering and performing treatments and interventions, ordering and review of laboratory studies, ordering and review of radiographic studies, pulse oximetry, re-evaluation of patient's condition, obtaining history from patient or surrogate and review of old charts   ____________________________________________   INITIAL IMPRESSION / [place] / ED COURSE     Presents with above-stated history exam for assessment approximately 3 days of palpitations in the setting of chronic cough.  He denies any associated pain or shortness of breath.  On arrival he is tachycardic with heart rates in the 120s, tachypneic at 23, with otherwise stable vital signs and afebrile.  He does have some diminished breath sounds bilaterally but no overt wheezing.  Differential includes ACS, COPD exacerbation, arrhythmia, anemia, PE, pneumonia, metabolic derangements, possible sympathomimetic ingestion acute heart failure exacerbation.  ECG has multiple nonspecific findings consistent with possible arrhythmia versus demand changes.  Initial troponin is elevated at 74 concerning for possible NSTEMI.  We will start on heparin and give  patient ASA.  BMP shows no significant electrolyte or metabolic derangements.  CBC shows no leukocytosis or acute anemia.  COVID and influenza test is negative.  Chest x-ray has mild congestive heart failure pulm edema without focal consolidation, effusion or pneumothorax.  Her D-dimer is elevated 2.9 and this will obtain a CTA chest to rule out PE.  Care patient signed over to Dr. Leory Plowman at approximately 1500.  Plan  is to follow-up CTA and likely admit.  ____________________________________________   FINAL CLINICAL IMPRESSION(S) / ED DIAGNOSES  Final diagnoses:  Palpitations  NSTEMI (non-ST elevated myocardial infarction) ([place])    Medications  aspirin chewable tablet 324 mg (has no administration in time range)     ED Discharge Orders     None        Note:  This document was prepared using Dragon voice recognition software and may include unintentional dictation errors.    Lucrezia Starch, MD 12/22/20 1536

## 2020-12-22 NOTE — Progress Notes (Signed)
Patient admitted to room 245 from ED.  CT of chest revealed PE.  Patient SOB and tachycardic otherwise VSS on Room Air.  Assessment to be completed on night shift.  Patient aware of need to collect urine sample.  Patient's needs and call bell within reach.  Will continue to monitor and assess with plan of care.

## 2020-12-22 NOTE — Plan of Care (Signed)

## 2020-12-23 ENCOUNTER — Inpatient Hospital Stay: Payer: Medicaid Other

## 2020-12-23 ENCOUNTER — Inpatient Hospital Stay
Admit: 2020-12-23 | Discharge: 2020-12-23 | Disposition: A | Discharge status: Home / Self Care | Payer: Medicaid Other | Attending: Internal Medicine | Admitting: Internal Medicine

## 2020-12-23 DIAGNOSIS — I2510 Atherosclerotic heart disease of native coronary artery without angina pectoris: Secondary | ICD-10-CM | POA: Diagnosis not present

## 2020-12-23 DIAGNOSIS — F1910 Other psychoactive substance abuse, uncomplicated: Secondary | ICD-10-CM | POA: Diagnosis not present

## 2020-12-23 DIAGNOSIS — R778 Other specified abnormalities of plasma proteins: Secondary | ICD-10-CM | POA: Diagnosis not present

## 2020-12-23 DIAGNOSIS — I2694 Multiple subsegmental pulmonary emboli without acute cor pulmonale: Secondary | ICD-10-CM | POA: Diagnosis not present

## 2020-12-23 LAB — HEPARIN LEVEL (UNFRACTIONATED): Heparin Unfractionated: 0.35 IU/mL (ref 0.30–0.70)

## 2020-12-23 LAB — CBC
HCT: 43.2 % (ref 39.0–52.0)
HCT: 43.4 % (ref 39.0–52.0)
Hemoglobin: 14.6 g/dL (ref 13.0–17.0)
Hemoglobin: 14.8 g/dL (ref 13.0–17.0)
MCH: 30.9 pg (ref 26.0–34.0)
MCH: 32 pg (ref 26.0–34.0)
MCHC: 33.8 g/dL (ref 30.0–36.0)
MCHC: 34.1 g/dL (ref 30.0–36.0)
MCV: 91.3 fL (ref 80.0–100.0)
MCV: 93.7 fL (ref 80.0–100.0)
Platelets: 253 10*3/uL (ref 150–400)
Platelets: 234 10*3/uL (ref 150–400)
RBC: 4.73 MIL/uL (ref 4.22–5.81)
RBC: 4.63 MIL/uL (ref 4.22–5.81)
RDW: 13.3 % (ref 11.5–15.5)
RDW: 13.4 % (ref 11.5–15.5)
WBC: 6.9 10*3/uL (ref 4.0–10.5)
WBC: 7.8 10*3/uL (ref 4.0–10.5)
nRBC: 0 % (ref 0.0–0.2)
nRBC: 0 % (ref 0.0–0.2)

## 2020-12-23 LAB — HIV ANTIBODY (ROUTINE TESTING W REFLEX): HIV Screen 4th Generation wRfx: NONREACTIVE

## 2020-12-23 LAB — MAGNESIUM: Magnesium: 2 mg/dL (ref 1.7–2.4)

## 2020-12-23 LAB — GLUCOSE, CAPILLARY
Glucose-Capillary: 127 mg/dL — ABNORMAL HIGH (ref 70–99)
Glucose-Capillary: 146 mg/dL — ABNORMAL HIGH (ref 70–99)
Glucose-Capillary: 157 mg/dL — ABNORMAL HIGH (ref 70–99)
Glucose-Capillary: 165 mg/dL — ABNORMAL HIGH (ref 70–99)
Glucose-Capillary: 177 mg/dL — ABNORMAL HIGH (ref 70–99)

## 2020-12-23 LAB — HEMOGLOBIN A1C
Hgb A1c MFr Bld: 8.7 % — ABNORMAL HIGH (ref 4.8–5.6)
Mean Plasma Glucose: 202.99 mg/dL

## 2020-12-23 LAB — PHOSPHORUS: Phosphorus: 3.7 mg/dL (ref 2.5–4.6)

## 2020-12-23 MED ORDER — LORAZEPAM 1 MG PO TABS
1.0000 mg | ORAL_TABLET | ORAL | Status: DC | PRN
  Filled 2020-12-23: qty 2

## 2020-12-23 MED ORDER — THIAMINE HCL 100 MG/ML IJ SOLN
100.0000 mg | Freq: Every day | INTRAMUSCULAR | Status: DC
  Administered 2020-12-25 – 2020-12-31 (×2): 100 mg via INTRAVENOUS
  Filled 2020-12-23 (×3): qty 2

## 2020-12-23 MED ORDER — ADULT MULTIVITAMIN W/MINERALS CH
1.0000 | ORAL_TABLET | Freq: Every day | ORAL | Status: DC
  Administered 2020-12-23 – 2020-12-26 (×3): 1 via ORAL
  Filled 2020-12-23 (×3): qty 1

## 2020-12-23 MED ORDER — THIAMINE HCL 100 MG PO TABS
100.0000 mg | ORAL_TABLET | Freq: Every day | ORAL | Status: DC
  Administered 2020-12-23 – 2021-01-03 (×8): 100 mg via ORAL
  Filled 2020-12-23 (×8): qty 1

## 2020-12-23 MED ORDER — FOLIC ACID 1 MG PO TABS
1.0000 mg | ORAL_TABLET | Freq: Every day | ORAL | Status: DC
  Administered 2020-12-23 – 2020-12-26 (×3): 1 mg via ORAL
  Filled 2020-12-23 (×3): qty 1

## 2020-12-23 MED ORDER — LORAZEPAM 2 MG/ML IJ SOLN: 1.0000 mg | INTRAMUSCULAR | Status: DC | PRN

## 2020-12-23 MED ORDER — NICOTINE 21 MG/24HR TD PT24
21.0000 mg | MEDICATED_PATCH | Freq: Every day | TRANSDERMAL | Status: DC
  Administered 2020-12-23 – 2021-01-03 (×12): 21 mg via TRANSDERMAL
  Filled 2020-12-23 (×12): qty 1

## 2020-12-23 MED ORDER — FUROSEMIDE 10 MG/ML IJ SOLN
20.0000 mg | Freq: Once | INTRAMUSCULAR | Status: AC
  Administered 2020-12-23: 20 mg via INTRAVENOUS
  Filled 2020-12-23: qty 2

## 2020-12-23 NOTE — Progress Notes (Signed)
   12/22/20 1851  Assess: MEWS Score  Temp 97.9 F (36.6 C)  BP 125/87  Pulse Rate (!) 126  Resp 19  Level of Consciousness Alert  SpO2 100 %  O2 Device Room Air  Assess: MEWS Score  MEWS Temp 0  MEWS Systolic 0  MEWS Pulse 2  MEWS RR 0  MEWS LOC 0  MEWS Score 2  MEWS Score Color Yellow  Assess: if the MEWS score is Yellow or Red  Were vital signs taken at a resting state? Yes  Focused Assessment No change from prior assessment  MEWS guidelines implemented *See Row Information* No, previously yellow, continue vital signs every 4 hours  Treat  Pain Scale 0-10  Pain Score 0  Notify: Charge Nurse/RN  Name of Charge Nurse/RN Notified Leanora Ivanoff, RN  Date Charge Nurse/RN Notified 12/22/20  Time Charge Nurse/RN Notified 1851  Notify: Provider  Provider Name/Title Mikey College, MD  Date Provider Notified 12/22/20  Time Provider Notified [phone]  Notification Type Page  Notification Reason Other (Comment) (FYI Patient now admitted to Room 245 and continues tachycardic)  Assess: SIRS CRITERIA  SIRS Temperature  0  SIRS Pulse 1  SIRS Respirations  0  SIRS WBC 0  SIRS Score Sum  1

## 2020-12-23 NOTE — Progress Notes (Signed)
ANTICOAGULATION CONSULT NOTE  Pharmacy Consult for heparin Indication: chest pain/ACS  Allergies  Allergen Reactions   Amoxicillin Swelling   Keflex [Cephalexin] Rash    Patient Measurements: Height: 5\' 11"  (180.3 cm) Weight: 117.9 kg (260 lb) IBW/kg (Calculated) : 75.3 Heparin Dosing Weight: 101 kg  Vital Signs: Temp: 97.5 F (36.4 C) (07/12 0411) Temp Source: Oral (07/12 0411) BP: 111/73 (07/12 0411) Pulse Rate: 121 (07/12 0411)  Labs: Recent Labs    12/22/20 1421 12/22/20 1500 12/22/20 1721 12/22/20 2257  HGB 15.4  --   --   --   HCT 44.1  --   --   --   PLT 252  --   --   --   APTT  --  30  --   --   LABPROT  --  13.0  --   --   INR  --  1.0  --   --   HEPARINUNFRC  --   --   --  0.37  CREATININE 1.04  --   --   --   TROPONINIHS 74*  --  76*  --      Estimated Creatinine Clearance: 94.9 mL/min (by C-G formula based on SCr of 1.04 mg/dL).   Medical History: Past Medical History:  Diagnosis Date   Coronary artery disease    Diabetes mellitus without complication (HCC)    Hypertension    S/P CABG x 1     Assessment: 65 year old male w/ h/o CAD (s/p CABG), HTN, IIDM, came with new onset of palpitations and SOB. Elevation in troponin concerning for NSTEMI, w/ further w/u revealing acute Bil PE. Pharmacy consulted for mgmt of heparin drip.  7/11: CTA positive for acute moderate clot burden bilateral pulmonary emboli no saddle embolus.  Mild to moderate cardiomegaly.  No signs of right heart strain.  Normal RV/LV ratio.  Goal of Therapy:  Heparin level 0.3-0.7 units/ml Monitor platelets by anticoagulation protocol: Yes  Date Time aPTT/HL Rate/Comment 07/11 2257     0.37  thera x1; 1350 un/hr 07/11 0653     0.35  Thera x2; 1350 un/hr       Baseline Labs: aPTT - 30s INR - 1.0 Hgb - 15.4>14.6 Plts - 252>253 Trop 74>76  Plan:  Therapeutic twice consecutively, will continue w/ current rate and daily monitoring with AM labs. Anticipating IV > PO AC  switch in 24-48hrs. Troponins positive but flat Continue heparin infusion at 1350 units/hr Recheck HL with AM labs. CBC daily while on heparin infusion  05-02-1980, [facility] 12/23/2020 7:28 AM

## 2020-12-23 NOTE — Progress Notes (Signed)
PROGRESS NOTE    Jeremy [name]  GMW:[id number] DOB: 12/21/1955 DOA: 12/22/2020 PCP: Patient, No Pcp Per (Inactive)    Brief Narrative:  Jeremy [name] is a 65 y.o. male with medical history significant of CAD status post CABG, HTN, IIDM, came with new onset of palpitations and shortness of breath.CTA positive for acute moderate clot burden bilateral pulmonary emboli no saddle embolus.  Mild to moderate cardiomegaly.  No signs of right heart strain.  Normal RV/LV ratio.   7/`12- pt is very angry that someone came at 4 am to ck his legs. He is also upset that I am asking him "stupid " questions. This am he is not pleasant. Denies cp. Refused echo.  Appears sob while talking, tachypnic. Asked him to wear his 02 [place].    Consultants:    Procedures:   Antimicrobials:      Subjective: No cp  Objective: Vitals:   12/23/20 0014 12/23/20 0411 12/23/20 0812 12/23/20 1154  BP: 123/89 111/73 109/78 118/87  Pulse: (!) 121 (!) 121 (!) 125 (!) 119  Resp: (!) 25 18 20 20   Temp: 97.7 F (36.5 C) (!) 97.5 F (36.4 C) 97.8 F (36.6 C)   TempSrc: Oral Oral Oral   SpO2: 99% 99% 98% 93%  Weight:      Height:        Intake/Output Summary (Last 24 hours) at 12/23/2020 1501 Last data filed at 12/23/2020 02/23/2021 Gross per 24 hour  Intake 240 ml  Output 700 ml  Net -460 ml   Filed Weights   12/22/20 1353  Weight: 117.9 kg    Examination:  General exam: appears tachypnic Respiratory system: decrease bs at bases  Cardiovascular system: S1 & S2 heard, regular, tachycardic  No gallops  Gastrointestinal system: Abdomen is nondistended, soft and nontender.  Normal bowel sounds heard. Central nervous system: Alert and oriented.  Grossly intact Extremities: trace b/l edema Psychiatry: in foul angry mood     Data Reviewed: I have personally reviewed following labs and imaging studies  CBC: Recent Labs  Lab 12/22/20 1421 12/23/20 0653  WBC 6.6 6.9  HGB 15.4 14.6  HCT 44.1 43.2   MCV 91.7 91.3  PLT 252 253   Basic Metabolic Panel: Recent Labs  Lab 12/22/20 1421  NA 139  K 4.1  CL 111  CO2 21*  GLUCOSE 136*  BUN 13  CREATININE 1.04  CALCIUM 9.2   GFR: Estimated Creatinine Clearance: 94.9 mL/min (by C-G formula based on SCr of 1.04 mg/dL). Liver Function Tests: No results for input(s): AST, ALT, ALKPHOS, BILITOT, PROT, ALBUMIN in the last 168 hours. No results for input(s): LIPASE, AMYLASE in the last 168 hours. No results for input(s): AMMONIA in the last 168 hours. Coagulation Profile: Recent Labs  Lab 12/22/20 1500  INR 1.0   Cardiac Enzymes: No results for input(s): CKTOTAL, CKMB, CKMBINDEX, TROPONINI in the last 168 hours. BNP (last 3 results) No results for input(s): PROBNP in the last 8760 hours. HbA1C: Recent Labs    12/22/20 2257  HGBA1C 8.7*   CBG: Recent Labs  Lab 12/22/20 2023 12/23/20 0047 12/23/20 0808 12/23/20 1156  GLUCAP 220* 146* 165* 177*   Lipid Profile: No results for input(s): CHOL, HDL, LDLCALC, TRIG, CHOLHDL, LDLDIRECT in the last 72 hours. Thyroid Function Tests: No results for input(s): TSH, T4TOTAL, FREET4, T3FREE, THYROIDAB in the last 72 hours. Anemia Panel: No results for input(s): VITAMINB12, FOLATE, FERRITIN, TIBC, IRON, RETICCTPCT in the last 72 hours. Sepsis Labs: Recent Labs  Lab 12/22/20 1421  PROCALCITON <0.10    Recent Results (from the past 240 hour(s))  Resp Panel by RT-PCR (Flu A&B, Covid) Nasopharyngeal Swab     Status: None   Collection Time: 12/22/20  2:21 PM   Specimen: Nasopharyngeal Swab; Nasopharyngeal(NP) swabs in vial transport medium  Result Value Ref Range Status   SARS Coronavirus 2 by RT PCR NEGATIVE NEGATIVE Final    Comment: (NOTE) SARS-CoV-2 target nucleic acids are NOT DETECTED.  The SARS-CoV-2 RNA is generally detectable in upper respiratory specimens during the acute phase of infection. The lowest concentration of SARS-CoV-2 viral copies this assay can detect  is 138 copies/mL. A negative result does not preclude SARS-Cov-2 infection and should not be used as the sole basis for treatment or other patient management decisions. A negative result may occur with  improper specimen collection/handling, submission of specimen other than nasopharyngeal swab, presence of viral mutation(s) within the areas targeted by this assay, and inadequate number of viral copies(<138 copies/mL). A negative result must be combined with clinical observations, patient history, and epidemiological information. The expected result is Negative.  Fact Sheet for Patients:  BloggerCourse.com  Fact Sheet for Healthcare Providers:  SeriousBroker.it  This test is no t yet approved or cleared by the [place] FDA and  has been authorized for detection and/or diagnosis of SARS-CoV-2 by FDA under an Emergency Use Authorization (EUA). This EUA will remain  in effect (meaning this test can be used) for the duration of the COVID-19 declaration under Section 564(b)(1) of the Act, 21 U.S.C.section 360bbb-3(b)(1), unless the authorization is terminated  or revoked sooner.       Influenza A by PCR NEGATIVE NEGATIVE Final   Influenza B by PCR NEGATIVE NEGATIVE Final    Comment: (NOTE) The Xpert Xpress SARS-CoV-2/FLU/RSV plus assay is intended as an aid in the diagnosis of influenza from Nasopharyngeal swab specimens and should not be used as a sole basis for treatment. Nasal washings and aspirates are unacceptable for Xpert Xpress SARS-CoV-2/FLU/RSV testing.  Fact Sheet for Patients: BloggerCourse.com  Fact Sheet for Healthcare Providers: SeriousBroker.it  This test is not yet approved or cleared by the [place] FDA and has been authorized for detection and/or diagnosis of SARS-CoV-2 by FDA under an Emergency Use Authorization (EUA). This EUA will remain in effect  (meaning this test can be used) for the duration of the COVID-19 declaration under Section 564(b)(1) of the Act, 21 U.S.C. section 360bbb-3(b)(1), unless the authorization is terminated or revoked.  Performed at [facility], [address] [place], [place] [address]          Radiology Studies: DG Chest 2 View  Result Date: 12/22/2020 CLINICAL DATA:  Dyspnea, palpitations EXAM: CHEST - 2 VIEW COMPARISON:  02/12/2018 chest radiograph. FINDINGS: Intact sternotomy wires. Stable cardiomediastinal silhouette with mild cardiomegaly. No pneumothorax. No pleural effusion. Mild pulmonary edema. No acute consolidative airspace disease. IMPRESSION: Mild congestive heart failure. Electronically Signed   By: Delbert Phenix M.D.   On: 12/22/2020 14:54   CT Angio Chest PE W and/or Wo Contrast  Result Date: 12/22/2020 CLINICAL DATA:  Palpitations, chronic cough EXAM: CT ANGIOGRAPHY CHEST WITH CONTRAST TECHNIQUE: Multidetector CT imaging of the chest was performed using the standard protocol during bolus administration of intravenous contrast. Multiplanar CT image reconstructions and MIPs were obtained to evaluate the vascular anatomy. CONTRAST:  63mL OMNIPAQUE IOHEXOL 350 MG/ML SOLN COMPARISON:  Chest radiograph from earlier today. 10/02/2011 chest CT. FINDINGS: Cardiovascular: The study is high quality  for the evaluation of pulmonary embolism. No saddle embolus. Acute moderate clot burden bilateral pulmonary emboli including segmental and subsegmental left upper and left lower lobe branches, distal right pulmonary artery and lobar, segmental and subsegmental right upper lobe branches. Atherosclerotic nonaneurysmal thoracic aorta. Top-normal caliber main pulmonary artery (3.1 cm diameter). Mild-to-moderate cardiomegaly. No significant pericardial fluid/thickening. RV/LV ratio 0.76, within normal limits. Mediastinum/Nodes: No discrete thyroid nodules. Unremarkable esophagus. No pathologically enlarged  axillary, mediastinal or hilar lymph nodes. Lungs/Pleura: No pneumothorax. No pleural effusion. A small portion of the dependent right lung base was inadvertently excluded from the scan. Posterior right middle lobe 1.0 cm solid pulmonary nodule (series 6/image 62), not substantially changed since 02/10/2018 CT abdomen study, considered benign. No acute consolidative airspace disease, lung masses or additional significant pulmonary nodules. Upper abdomen: Mild contrast reflux into the IVC and hepatic veins. Musculoskeletal: No aggressive appearing focal osseous lesions. Intact sternotomy wires. Mild thoracic spondylosis. Review of the MIP images confirms the above findings. IMPRESSION: 1. Acute moderate clot burden bilateral pulmonary emboli, extending proximally to the distal right pulmonary artery level. No saddle embolus. Normal RV/LV ratio. 2. Mild-to-moderate cardiomegaly. Mild contrast reflux into the IVC and hepatic veins. 3. Aortic Atherosclerosis (ICD10-I70.0). Critical Value/emergent results were called by telephone at the time of interpretation on 12/22/2020 at 4:57 pm to provider Donna BernardEVAN BRADLER, MD, who verbally acknowledged these results. Electronically Signed   By: Delbert PhenixJason A Poff M.D.   On: 12/22/2020 17:00   US Venous Img Lower Bilateral (DVT)  Result Date: 12/23/2020 CLINICAL DATA:  Known pulmonary emboli. EXAM: BILATERAL LOWER EXTREMITY VENOUS DOPPLER ULTRASOUND TECHNIQUE: Gray-scale sonography with graded compression, as well as color Doppler and duplex ultrasound were performed to evaluate the lower extremity deep venous systems from the level of the common femoral vein and including the common femoral, femoral, profunda femoral, popliteal and calf veins including the posterior tibial, peroneal and gastrocnemius veins when visible. The superficial great saphenous vein was also interrogated. Spectral Doppler was utilized to evaluate flow at rest and with distal augmentation maneuvers in the common  femoral, femoral and popliteal veins. COMPARISON:  None. FINDINGS: RIGHT LOWER EXTREMITY Common Femoral Vein: No evidence of thrombus. Normal compressibility, respiratory phasicity and response to augmentation. Saphenofemoral Junction: No evidence of thrombus. Normal compressibility and flow on color Doppler imaging. Profunda Femoral Vein: No evidence of thrombus. Normal compressibility and flow on color Doppler imaging. Femoral Vein: No evidence of thrombus. Normal compressibility, respiratory phasicity and response to augmentation. Popliteal Vein: No evidence of thrombus. Normal compressibility, respiratory phasicity and response to augmentation. Calf Veins: No evidence of thrombus. Normal compressibility and flow on color Doppler imaging. Superficial Great Saphenous Vein: No evidence of thrombus. Normal compressibility. Venous Reflux:  None. Other Findings:  None. LEFT LOWER EXTREMITY Common Femoral Vein: No evidence of thrombus. Normal compressibility, respiratory phasicity and response to augmentation. Saphenofemoral Junction: No evidence of thrombus. Normal compressibility and flow on color Doppler imaging. Profunda Femoral Vein: No evidence of thrombus. Normal compressibility and flow on color Doppler imaging. Femoral Vein: No evidence of thrombus. Normal compressibility, respiratory phasicity and response to augmentation. Popliteal Vein: No evidence of thrombus. Normal compressibility, respiratory phasicity and response to augmentation. Calf Veins: No evidence of thrombus. Normal compressibility and flow on color Doppler imaging. Superficial Great Saphenous Vein: No evidence of thrombus. Normal compressibility. Venous Reflux:  None. Other Findings:  None. IMPRESSION: No evidence of deep venous thrombosis in either lower extremity. Electronically Signed   By: Eulah PontMark  Lukens M.D.  On: 12/23/2020 02:56        Scheduled Meds:  folic acid  1 mg Oral Daily   insulin aspart  0-15 Units Subcutaneous TID WC    insulin aspart  0-5 Units Subcutaneous QHS   metoprolol tartrate  25 mg Oral BID   multivitamin with minerals  1 tablet Oral Daily   nicotine  21 mg Transdermal Daily   thiamine  100 mg Oral Daily   Or   thiamine  100 mg Intravenous Daily   Continuous Infusions:  heparin 1,350 Units/hr (12/22/20 1718)    Assessment & Plan:   Active Problems:   Pulmonary emboli (HCC)   Acute moderate clot burden bilateral pulmonary emboli No right heart strain on CT.  Cardiomegaly Tachypneic this a.m. while at rest. Refused echo Vascular ultrasound negative for DVT Chest x-ray with mild CHF Will give Lasix 20 mg IV x1 Continue heparin drip today as he is tachypneic, once his breathing is better likely in 1 to 2 days can transition to p.o. anticoagulation. I have asked pharmacy to see whether Eliquis or Xarelto is covered by his insurance. He will need outpatient hematology follow-up for hypercoagulable state.  Keep O2 sats above 92%   IIDM Hold metformin for 72 hours BG stable Continue R-ISS  Polysubstance abuse Alcohol abuse Patient positive for cocaine and cannabinoid Will place him on CIWA protocol High risk for walking out AMA as he tried earlier   Positive troponins Likely due to demand ischemia as above He refused echo Can follow-up with cardiology as outpatient  CAD as above      DVT prophylaxis: heparin drip Code Status: Full Family Communication: None at bedside Disposition Plan:  Status is: Inpatient  Remains inpatient appropriate because:IV treatments appropriate due to intensity of illness or inability to take PO  Dispo: The patient is from: Home              Anticipated d/c is to: Home              Patient currently is not medically stable to d/c.   Difficult to place patient No     Patient is tachypneic on room air.  Still requires IV heparin drip.      LOS: 1 day   Time spent: 45 minutes with more than 50% on COC    Lynn Ito, MD [facility] Pager [phone]  If 7PM-7AM, please contact night-coverage 12/23/2020, 3:01 PM

## 2020-12-23 NOTE — Plan of Care (Signed)

## 2020-12-23 NOTE — Progress Notes (Signed)
ANTICOAGULATION CONSULT NOTE  Pharmacy Consult for heparin Indication: chest pain/ACS  Allergies  Allergen Reactions   Amoxicillin Swelling   Keflex [Cephalexin] Rash    Patient Measurements: Height: 5\' 11"  (180.3 cm) Weight: 117.9 kg (260 lb) IBW/kg (Calculated) : 75.3 Heparin Dosing Weight: 101 kg  Vital Signs: Temp: 97.5 F (36.4 C) (07/11 1939) Temp Source: Oral (07/11 1356) BP: 122/91 (07/11 2153) Pulse Rate: 126 (07/11 2153)  Labs: Recent Labs    12/22/20 1421 12/22/20 1500 12/22/20 1721 12/22/20 2257  HGB 15.4  --   --   --   HCT 44.1  --   --   --   PLT 252  --   --   --   APTT  --  30  --   --   LABPROT  --  13.0  --   --   INR  --  1.0  --   --   HEPARINUNFRC  --   --   --  0.37  CREATININE 1.04  --   --   --   TROPONINIHS 74*  --  76*  --      Estimated Creatinine Clearance: 94.9 mL/min (by C-G formula based on SCr of 1.04 mg/dL).   Medical History: Past Medical History:  Diagnosis Date   Coronary artery disease    Diabetes mellitus without complication (HCC)    Hypertension    S/P CABG x 1     Assessment: 65 year old male presented with SOB. Elevation in troponin concerning for NSTEMI. Patient to start on heparin drip.  Goal of Therapy:  Heparin level 0.3-0.7 units/ml Monitor platelets by anticoagulation protocol: Yes  07/11 2257 HL 0.37, therapeutic x 1   Plan:  Continue heparin infusion at 1350 units/hr Recheck HL with AM labs (~ 6 hours). CBC daily while on heparin infusion   09/11, PharmD, [facility] 12/23/2020 12:14 AM

## 2020-12-24 ENCOUNTER — Other Ambulatory Visit (INDEPENDENT_AMBULATORY_CARE_PROVIDER_SITE_OTHER): Payer: Self-pay | Admitting: Vascular Surgery

## 2020-12-24 ENCOUNTER — Inpatient Hospital Stay
Admit: 2020-12-24 | Discharge: 2020-12-24 | Disposition: A | Discharge status: Home / Self Care | Payer: Medicaid Other | Attending: Internal Medicine | Admitting: Internal Medicine

## 2020-12-24 ENCOUNTER — Encounter
Admission: EM | Disposition: A | Discharge status: Home / Self Care | Payer: Self-pay | Source: Home / Self Care | Attending: Internal Medicine

## 2020-12-24 DIAGNOSIS — I2694 Multiple subsegmental pulmonary emboli without acute cor pulmonale: Secondary | ICD-10-CM | POA: Diagnosis not present

## 2020-12-24 DIAGNOSIS — I214 Non-ST elevation (NSTEMI) myocardial infarction: Secondary | ICD-10-CM | POA: Diagnosis not present

## 2020-12-24 DIAGNOSIS — J9601 Acute respiratory failure with hypoxia: Secondary | ICD-10-CM | POA: Diagnosis not present

## 2020-12-24 DIAGNOSIS — I2699 Other pulmonary embolism without acute cor pulmonale: Secondary | ICD-10-CM

## 2020-12-24 HISTORY — PX: PULMONARY THROMBECTOMY: CATH118295

## 2020-12-24 LAB — BASIC METABOLIC PANEL
Anion gap: 7 (ref 5–15)
Anion gap: 6 (ref 5–15)
BUN: 18 mg/dL (ref 8–23)
BUN: 20 mg/dL (ref 8–23)
CO2: 25 mmol/L (ref 22–32)
CO2: 23 mmol/L (ref 22–32)
Calcium: 9.7 mg/dL (ref 8.9–10.3)
Calcium: 9.1 mg/dL (ref 8.9–10.3)
Chloride: 108 mmol/L (ref 98–111)
Chloride: 111 mmol/L (ref 98–111)
Creatinine, Ser: 1.25 mg/dL — ABNORMAL HIGH (ref 0.61–1.24)
Creatinine, Ser: 1.26 mg/dL — ABNORMAL HIGH (ref 0.61–1.24)
GFR, Estimated: >60 mL/min (ref >60)
GFR, Estimated: >60 mL/min (ref >60)
Glucose, Bld: 170 mg/dL — ABNORMAL HIGH (ref 70–99)
Glucose, Bld: 136 mg/dL — ABNORMAL HIGH (ref 70–99)
Potassium: 4.2 mmol/L (ref 3.5–5.1)
Potassium: 4 mmol/L (ref 3.5–5.1)
Sodium: 140 mmol/L (ref 135–145)
Sodium: 140 mmol/L (ref 135–145)

## 2020-12-24 LAB — CBC
HCT: 41.9 % (ref 39.0–52.0)
Hemoglobin: 14.6 g/dL (ref 13.0–17.0)
MCH: 32.4 pg (ref 26.0–34.0)
MCHC: 34.8 g/dL (ref 30.0–36.0)
MCV: 93.1 fL (ref 80.0–100.0)
Platelets: 241 10*3/uL (ref 150–400)
RBC: 4.5 MIL/uL (ref 4.22–5.81)
RDW: 13.5 % (ref 11.5–15.5)
WBC: 6.4 10*3/uL (ref 4.0–10.5)
nRBC: 0 % (ref 0.0–0.2)

## 2020-12-24 LAB — HEPARIN LEVEL (UNFRACTIONATED): Heparin Unfractionated: 0.25 IU/mL — ABNORMAL LOW (ref 0.30–0.70)

## 2020-12-24 LAB — GLUCOSE, CAPILLARY
Glucose-Capillary: 174 mg/dL — ABNORMAL HIGH (ref 70–99)
Glucose-Capillary: 208 mg/dL — ABNORMAL HIGH (ref 70–99)
Glucose-Capillary: 102 mg/dL — ABNORMAL HIGH (ref 70–99)

## 2020-12-24 LAB — TSH: TSH: 2.007 u[IU]/mL (ref 0.350–4.500)

## 2020-12-24 LAB — ECHOCARDIOGRAM COMPLETE
Height: 71 in
Weight: 4160 oz

## 2020-12-24 LAB — MAGNESIUM: Magnesium: 2.2 mg/dL (ref 1.7–2.4)

## 2020-12-24 SURGERY — PULMONARY THROMBECTOMY
Anesthesia: Moderate Sedation | Laterality: Bilateral

## 2020-12-24 MED ORDER — FAMOTIDINE 20 MG PO TABS: 40.0000 mg | ORAL_TABLET | Freq: Once | ORAL | Status: DC | PRN

## 2020-12-24 MED ORDER — CLINDAMYCIN PHOSPHATE 300 MG/50ML IV SOLN
INTRAVENOUS | Status: AC
  Filled 2020-12-24: qty 50

## 2020-12-24 MED ORDER — DIPHENHYDRAMINE HCL 50 MG/ML IJ SOLN: 50.0000 mg | Freq: Once | INTRAMUSCULAR | Status: DC | PRN

## 2020-12-24 MED ORDER — HEPARIN SODIUM (PORCINE) 1000 UNIT/ML IJ SOLN
INTRAMUSCULAR | Status: AC
  Filled 2020-12-24: qty 1

## 2020-12-24 MED ORDER — DEXMEDETOMIDINE HCL IN NACL 400 MCG/100ML IV SOLN
0.0000 ug/kg/h | INTRAVENOUS | Status: DC
  Administered 2020-12-24: 1.2 ug/kg/h via INTRAVENOUS
  Administered 2020-12-25: 0.7 ug/kg/h via INTRAVENOUS
  Administered 2020-12-25: 0.8 ug/kg/h via INTRAVENOUS
  Administered 2020-12-25: 0.7 ug/kg/h via INTRAVENOUS
  Filled 2020-12-24 (×5): qty 100

## 2020-12-24 MED ORDER — FENTANYL CITRATE (PF) 100 MCG/2ML IJ SOLN
INTRAMUSCULAR | Status: DC | PRN
  Administered 2020-12-24: 50 ug via INTRAVENOUS
  Administered 2020-12-24: 25 ug via INTRAVENOUS

## 2020-12-24 MED ORDER — SODIUM CHLORIDE 0.9 % IV SOLN: INTRAVENOUS | Status: DC

## 2020-12-24 MED ORDER — HYDROMORPHONE HCL 1 MG/ML IJ SOLN: 1.0000 mg | Freq: Once | INTRAMUSCULAR | Status: DC | PRN

## 2020-12-24 MED ORDER — DIAZEPAM 5 MG/ML IJ SOLN: 5.0000 mg | Freq: Once | INTRAMUSCULAR | Status: AC

## 2020-12-24 MED ORDER — METHYLPREDNISOLONE SODIUM SUCC 125 MG IJ SOLR: 125.0000 mg | Freq: Once | INTRAMUSCULAR | Status: DC | PRN

## 2020-12-24 MED ORDER — DIAZEPAM 5 MG/ML IJ SOLN
INTRAMUSCULAR | Status: AC
  Administered 2020-12-24: 5 mg
  Filled 2020-12-24: qty 2

## 2020-12-24 MED ORDER — CHLORHEXIDINE GLUCONATE CLOTH 2 % EX PADS
6.0000 | MEDICATED_PAD | Freq: Every day | CUTANEOUS | Status: DC
  Administered 2020-12-24 – 2021-01-02 (×11): 6 via TOPICAL

## 2020-12-24 MED ORDER — CLINDAMYCIN PHOSPHATE 300 MG/50ML IV SOLN
300.0000 mg | Freq: Once | INTRAVENOUS | Status: AC
  Administered 2020-12-24: 300 mg via INTRAVENOUS

## 2020-12-24 MED ORDER — HALOPERIDOL LACTATE 5 MG/ML IJ SOLN
5.0000 mg | Freq: Once | INTRAMUSCULAR | Status: AC
  Administered 2020-12-24: 5 mg via INTRAVENOUS

## 2020-12-24 MED ORDER — DIAZEPAM 5 MG/ML IJ SOLN: 5.0000 mg | Freq: Once | INTRAMUSCULAR | Status: DC

## 2020-12-24 MED ORDER — MIDAZOLAM HCL 2 MG/ML PO SYRP: 8.0000 mg | ORAL_SOLUTION | Freq: Once | ORAL | Status: DC | PRN

## 2020-12-24 MED ORDER — MIDAZOLAM HCL 2 MG/2ML IJ SOLN
2.0000 mg | Freq: Once | INTRAMUSCULAR | Status: AC
  Administered 2020-12-24: 2 mg via INTRAMUSCULAR
  Filled 2020-12-24: qty 2

## 2020-12-24 MED ORDER — OLANZAPINE 10 MG IM SOLR
10.0000 mg | Freq: Once | INTRAMUSCULAR | Status: AC | PRN
  Administered 2020-12-24: 10 mg via INTRAMUSCULAR
  Filled 2020-12-24: qty 10

## 2020-12-24 MED ORDER — ONDANSETRON HCL 4 MG/2ML IJ SOLN: 4.0000 mg | Freq: Four times a day (QID) | INTRAMUSCULAR | Status: DC | PRN

## 2020-12-24 MED ORDER — MIDAZOLAM HCL 5 MG/5ML IJ SOLN
INTRAMUSCULAR | Status: AC
  Filled 2020-12-24: qty 5

## 2020-12-24 MED ORDER — HALOPERIDOL LACTATE 5 MG/ML IJ SOLN: 2.0000 mg | Freq: Four times a day (QID) | INTRAMUSCULAR | Status: DC | PRN

## 2020-12-24 MED ORDER — HALOPERIDOL 2 MG PO TABS
2.0000 mg | ORAL_TABLET | Freq: Four times a day (QID) | ORAL | Status: DC | PRN
  Filled 2020-12-24: qty 1

## 2020-12-24 MED ORDER — DEXMEDETOMIDINE HCL IN NACL 200 MCG/50ML IV SOLN
0.4000 ug/kg/h | INTRAVENOUS | Status: DC
  Filled 2020-12-24: qty 50

## 2020-12-24 MED ORDER — FENTANYL CITRATE (PF) 100 MCG/2ML IJ SOLN
INTRAMUSCULAR | Status: AC
  Filled 2020-12-24: qty 2

## 2020-12-24 MED ORDER — ALPRAZOLAM 0.25 MG PO TABS: 0.2500 mg | ORAL_TABLET | Freq: Three times a day (TID) | ORAL | Status: DC | PRN

## 2020-12-24 MED ORDER — HEPARIN BOLUS VIA INFUSION
1500.0000 [IU] | Freq: Once | INTRAVENOUS | Status: AC
  Administered 2020-12-24: 1500 [IU] via INTRAVENOUS
  Filled 2020-12-24: qty 1500

## 2020-12-24 MED ORDER — SODIUM CHLORIDE 0.9 % IV BOLUS
1000.0000 mL | Freq: Once | INTRAVENOUS | Status: AC
  Administered 2020-12-24: 1000 mL via INTRAVENOUS

## 2020-12-24 MED ORDER — HALOPERIDOL LACTATE 5 MG/ML IJ SOLN
INTRAMUSCULAR | Status: AC
  Filled 2020-12-24: qty 1

## 2020-12-24 MED ORDER — DEXMEDETOMIDINE BOLUS VIA INFUSION
1.0000 ug/kg | Freq: Once | INTRAVENOUS | Status: DC
  Filled 2020-12-24: qty 118

## 2020-12-24 MED ORDER — MIDAZOLAM HCL 2 MG/2ML IJ SOLN
INTRAMUSCULAR | Status: DC | PRN
  Administered 2020-12-24: 2 mg via INTRAVENOUS
  Administered 2020-12-24: 1 mg via INTRAVENOUS

## 2020-12-24 MED ORDER — LORAZEPAM 2 MG/ML IJ SOLN
0.5000 mg | Freq: Once | INTRAMUSCULAR | Status: AC
  Administered 2020-12-24: 0.5 mg via INTRAVENOUS
  Filled 2020-12-24: qty 1

## 2020-12-24 SURGICAL SUPPLY — 14 items
CANISTER PENUMBRA ENGINE (MISCELLANEOUS) ×2 IMPLANT
CATH ANGIO 5F PIGTAIL 100CM (CATHETERS) ×2 IMPLANT
CATH INDIGO SEP 8 (CATHETERS) ×2 IMPLANT
CATH INFINITI JR4 5F (CATHETERS) ×2 IMPLANT
CATH LIGHTNING 8 XTORQ 115 (CATHETERS) ×2 IMPLANT
COVER PROBE U/S 5X48 (MISCELLANEOUS) ×2 IMPLANT
GLIDEWIRE ADV .035X180CM (WIRE) ×2 IMPLANT
PACK ANGIOGRAPHY (CUSTOM PROCEDURE TRAY) ×2 IMPLANT
SHEATH 9FRX11 (SHEATH) ×2 IMPLANT
SHEATH BRITE TIP 6FRX11 (SHEATH) ×2 IMPLANT
SHEATH BRITE TIP 8FRX11 (SHEATH) ×2 IMPLANT
TUBING CONTRAST HIGH PRESS 72 (TUBING) ×2 IMPLANT
WIRE GUIDERIGHT .035X150 (WIRE) ×2 IMPLANT
WIRE MAGIC TORQUE 260C (WIRE) ×2 IMPLANT

## 2020-12-24 NOTE — Progress Notes (Signed)
eLink Physician-Brief Progress Note Patient Name: Jeremy [name] DOB: 13-Jul-1955 MRN: [id number]   Date of Service  12/24/2020  HPI/Events of Note  Patient is s/p thrombectomy for hemodynamically significant bilateral PE, post-procedure he became extremely agitated / delirious and PRN benzodiazepines and anti-psychotics did not improve his status so he was transferred to the ICU for Precedex gtt and close monitoring. Patient has a history of substance abuse.   eICU Interventions  New Patient Evaluation.        [name] 12/24/2020, 10:16 PM

## 2020-12-24 NOTE — Care Management (Addendum)
Bedside RN informs that patient became very agitated after returning from thrombectomy.  Attempting to get out of bed.  Security called to bedside.  Has received 0.5mg  IV ativan, 5mg  IM haldol, 5mg  IM valium.  If above regimen ineffective, prn IM zyprexa ordered  Addendum: patient remains combative, violent.  Soft restraints and additional 5mg  IM valium ordered  If this is ineffective, patient may need transfer to SDU for precedex infusion

## 2020-12-24 NOTE — Progress Notes (Addendum)
Pt resting quietly now. Bilateral wrist restraints remain in place for staff and patient safety. Precedex gtt infusing. Head CT ordered per Allayne Gitelman, NP. Will take pt for CT when more stable. Pt has long pauses in breathing, 2L Andrew placed on pt at this time. Orders received to renew restraint order by Lanora Manis, NP.

## 2020-12-24 NOTE — H&P (Addendum)
 NAME:  Jeremy [name], MRN:  [id number], DOB:  12/01/1955, LOS: 2 ADMISSION DATE:  12/22/2020, CONSULTATION DATE:  12/24/2020 REFERRING MD: Jan, Mansy  MD, CHIEF COMPLAINT:  Agitation   History of present illness   64-year-old male with significant past medical history of CAD s/p CABG, HTN, llDM, polysubstance abuse, and MRSA infection who presented to the ED on 12/23/2018 with chief complaints of shortness of breath and palpitation x1 week  ED Course: On arrival to the ED, he was afebrile with blood pressure (!) 131/115mm Hg and pulse rate (!) 128 beats/min, RR (!) 26. Sats 96% on RA. There were no focal neurological deficits; he was alert and oriented x4, and he did not demonstrate any memory deficits.  Labs/Diagnostics BMP shows no significant electrolyte or metabolic derangements.  CBC shows no leukocytosis or acute anemia.  COVID and influenza test is negative EKG: Sinus tachycardia with a ventricular of 127, normal axis, unremarkable intervals with some T wave inversions nonspecific changes in lateral leads and some nonspecific changes in anterior leads without other clear evidence of acute ischemia. Troponin: 74 CXR: Mild congestive heart failure pulm edema without focal consolidation, effusion or pneumothorax. CTA Chest: acute moderate clot burden bilateral pulmonary emboli no saddle embolus.  Mild to moderate cardiomegaly.  No signs of right heart strain.  Normal RV/LV ratio Other Labs D-Dimer elevated at 2.9. Given positive finding of bilateral pulmonary embolus Szymon as above and elevated troponin concerning for NSTEMI, patient was started on heparin drip and admitted under hospitalist service for further management  Hospital Course: During the course of his admission patient remained visibly short of breath, tachypneic and tachycardic at rest.  Vascular surgeon was consulted who deemed him appropriate for pulmonary thrombectomy.  Patient underwent pulmonary thrombectomy on 12/24/2020.   Per nursing staff, following the thrombectomy procedure patient became agitated and irritable trying to leave AMA.  Security was called and patient subsequently received 0.5 mg IV Ativan, 5 mg IM Haldol, 5 mg IM Valium, 2 mg Versed, Zyprexa 10 mg IM with no effect.  He remained combative, violently agitated requiring multiple security at the bedside.  Patient was transferred to ICU for Precedex drip and PCCM consulted.  Past Medical History  CAD s/p CABG, HTN, llDM, polysubstance abuse, and MRSA infection   Significant Hospital Events   7/11: Admitted to PCU with bilateral pulmonary embolism 7/13: S/p pulmonary thrombectomy 7/13: Patient transferred to ICU for severe agitation requiring Precedex drip.  PCCM consulted  Consults:  PCCM Vascular Cardiology Procedures:  7/13: S/p pulmonary thrombectomy  Significant Diagnostic Tests:  7/11: Chest Xray>Mild congestive heart failure pulm edema without focal consolidation, effusion or pneumothorax. 7/11: CTA Chest>acute moderate clot burden bilateral pulmonary emboli no saddle embolus.  Mild to moderate cardiomegaly.  No signs of right heart strain.  Normal RV/LV ratio 7/13: Noncontrast CT head> Micro Data:  7/11: SARS-CoV-2 PCR>> negative 7/11: Influenza PCR>> negative  Antimicrobials:  None  OBJECTIVE  Blood pressure (!) 143/122, pulse (!) 125, temperature 98.5 F (36.9 C), resp. rate (!) 30, height 5' 11" (1.803 m), weight 117.9 kg, SpO2 90 %.        Intake/Output Summary (Last 24 hours) at 12/24/2020 2127 Last data filed at 12/24/2020 1233 Gross per 24 hour  Intake 240 ml  Output 550 ml  Net -310 ml   Filed Weights   12/22/20 1353  Weight: 117.9 kg   Physical Examination  GENERAL: 64 year-old critically ill patient lying in the bed confused, agitated and combative   EYES: Pupils equal, round, reactive to light and accommodation. No scleral icterus. Extraocular muscles intact.  HEENT: Head atraumatic, normocephalic. Oropharynx  and nasopharynx clear.  NECK:  Supple, no jugular venous distention. No thyroid enlargement, no tenderness.  LUNGS: decreased  breath sounds bilaterally, no wheezing, rales,rhonchi or crepitation. No use of accessory muscles of respiration.  CARDIOVASCULAR: S1, S2 normal. No murmurs, rubs, or gallops.  ABDOMEN: Soft, nontender, nondistended. Bowel sounds present. No organomegaly or mass.  EXTREMITIES: No pedal edema, cyanosis, or clubbing.  NEUROLOGIC: Cranial nerves II through XII are intact.  Muscle strength 5/5 in all extremities. Sensation intact. Gait not checked.  PSYCHIATRIC: The patient is confused, agitated and restless SKIN: No obvious rash, lesion, or ulcer.   Labs/imaging that I havepersonally reviewed  (right click and "Reselect all SmartList Selections" daily)      Labs   CBC: Recent Labs  Lab 12/22/20 1421 12/23/20 0653 12/23/20 1521 12/24/20 0656  WBC 6.6 6.9 7.8 6.4  HGB 15.4 14.6 14.8 14.6  HCT 44.1 43.2 43.4 41.9  MCV 91.7 91.3 93.7 93.1  PLT 252 253 234 241    Basic Metabolic Panel: Recent Labs  Lab 12/22/20 1421 12/23/20 1521 12/24/20 0656  NA 139  --  140  K 4.1  --  4.2  CL 111  --  108  CO2 21*  --  25  GLUCOSE 136*  --  170*  BUN 13  --  18  CREATININE 1.04  --  1.25*  CALCIUM 9.2  --  9.7  MG  --  2.0  --   PHOS  --  3.7  --    GFR: Estimated Creatinine Clearance: 79 mL/min (A) (by C-G formula based on SCr of 1.25 mg/dL (H)). Recent Labs  Lab 12/22/20 1421 12/23/20 0653 12/23/20 1521 12/24/20 0656  PROCALCITON <0.10  --   --   --   WBC 6.6 6.9 7.8 6.4    Liver Function Tests: No results for input(s): AST, ALT, ALKPHOS, BILITOT, PROT, ALBUMIN in the last 168 hours. No results for input(s): LIPASE, AMYLASE in the last 168 hours. No results for input(s): AMMONIA in the last 168 hours.  ABG    Component Value Date/Time   HCO3 11.6 (L) 08/19/2019 1036   ACIDBASEDEF 14.2 (H) 08/19/2019 1036   O2SAT 81.8 08/19/2019 1036      Coagulation Profile: Recent Labs  Lab 12/22/20 1500  INR 1.0    Cardiac Enzymes: No results for input(s): CKTOTAL, CKMB, CKMBINDEX, TROPONINI in the last 168 hours.  HbA1C: Hemoglobin A1C  Date/Time Value Ref Range Status  10/03/2011 05:33 AM 6.2 4.2 - 6.3 % Final    Comment:    The American Diabetes Association recommends that a primary goal of therapy should be <7% and that physicians should reevaluate the treatment regimen in patients with HbA1c values consistently >8%.    Hgb A1c MFr Bld  Date/Time Value Ref Range Status  12/22/2020 10:57 PM 8.7 (H) 4.8 - 5.6 % Final    Comment:    (NOTE) Pre diabetes:          5.7%-6.4%  Diabetes:              >6.4%  Glycemic control for   <7.0% adults with diabetes   08/20/2019 02:09 PM 13.5 (H) 4.8 - 5.6 % Final    Comment:    (NOTE) Pre diabetes:          5.7%-6.4% Diabetes:              >  6.4% Glycemic control for   <7.0% adults with diabetes     CBG: Recent Labs  Lab 12/23/20 1651 12/23/20 2053 12/24/20 0747 12/24/20 1133 12/24/20 1639  GLUCAP 157* 127* 174* 208* 102*    Review of Systems:   UNABLE TO ASSESS PATIENT IS VERY AGITATED, ALTERED AND COMBATIVE  Past Medical History  He,  has a past medical history of Coronary artery disease, Diabetes mellitus without complication ([place]), Hypertension, and S/P CABG x 1.   Surgical History    Past Surgical History:  Procedure Laterality Date   ANKLE ARTHROSCOPY W/ OPEN REPAIR Right    CORONARY ARTERY BYPASS GRAFT       Social History   reports that he has been smoking cigarettes. He has been smoking an average of 0.50 packs per day. He has quit using smokeless tobacco. He reports previous alcohol use. He reports current drug use. Drug: Marijuana.   Family History   His family history includes Heart attack in his father.   Allergies Allergies  Allergen Reactions   Amoxicillin Swelling   Keflex [Cephalexin] Rash     Home Medications  Prior to  Admission medications   Medication Sig Start Date End Date Taking? Authorizing Provider  metFORMIN (GLUCOPHAGE) 1000 MG tablet Take 1 tablet (1,000 mg total) by mouth 2 (two) times daily with a meal. 08/21/19  Yes Dhungel, Nishant, MD  naproxen sodium (ALEVE) 220 MG tablet Take 440 mg by mouth daily as needed.   Yes [provider]  blood glucose meter kit and supplies KIT Dispense based on patient and insurance preference. Use up to four times daily as directed. (FOR ICD-9 250.00, 250.01). 08/21/19   Dhungel, Nishant, MD  glimepiride (AMARYL) 2 MG tablet Take 1 tablet (2 mg total) by mouth daily with breakfast. Patient not taking: Reported on 12/22/2020 08/21/19   Dhungel, Flonnie Overman, MD  hydrOXYzine (ATARAX/VISTARIL) 25 MG tablet Take 25 mg by mouth 3 (three) times daily as needed for itching. Patient not taking: Reported on 12/22/2020    [provider]  linezolid (ZYVOX) 600 MG tablet Take 1 tablet (600 mg total) by mouth 2 (two) times daily. Patient not taking: Reported on 12/22/2020 10/18/19   Tsosie Billing, MD  losartan (COZAAR) 50 MG tablet Take 1 tablet (50 mg total) by mouth daily. Patient not taking: Reported on 12/22/2020 08/21/19   Dhungel, Flonnie Overman, MD  sitaGLIPtin (JANUVIA) 100 MG tablet Take 1 tablet (100 mg total) by mouth daily. Patient not taking: Reported on 12/22/2020 08/21/19   Dhungel, Flonnie Overman, MD    Scheduled Meds:  Chlorhexidine Gluconate Cloth  6 each Topical Daily   fentaNYL       folic acid  1 mg Oral Daily   haloperidol lactate       heparin sodium (porcine)       insulin aspart  0-15 Units Subcutaneous TID WC   insulin aspart  0-5 Units Subcutaneous QHS   metoprolol tartrate  25 mg Oral BID   midazolam       multivitamin with minerals  1 tablet Oral Daily   nicotine  21 mg Transdermal Daily   thiamine  100 mg Oral Daily   Or   thiamine  100 mg Intravenous Daily   Continuous Infusions:  sodium chloride     clindamycin     dexmedetomidine  (PRECEDEX) IV infusion 1.2 mcg/kg/hr (12/24/20 2100)   heparin Stopped (12/24/20 1816)   PRN Meds:.acetaminophen **OR** acetaminophen, ALPRAZolam, haloperidol **OR** haloperidol lactate, HYDROcodone-acetaminophen, HYDROmorphone (DILAUDID) injection,  ondansetron **OR** ondansetron (ZOFRAN) IV, ondansetron (ZOFRAN) IV, senna-docusate  Assessment & Plan:  Acute Bilateral Pulmonary Embolism -Status post thrombectomy POD#0 -Supplemental O2 as needed to maintain O2 saturations > 92% -High risk for intubation -Follow intermittent ABG and chest x-ray as needed -Extremity DVT US negative -Monitor Hemodynamic (goal MAP>65)  -Will need long term anticoagulation -Vascular following, appreciate input  Acute postoperative Delirium Patient noted with acute onset agitation, combativeness and confusion post -procedure for symptomatic bilateral pulmonary embolism -Start Precedex gtt with RASS Goal -1 -Will obtain STAT CT head for further evaluation -Avoid additional sedatives as able -Provide supportive care  New onset AFIB +RVR  Potential Primary Causes pulmonary in the setting of PE -EKG+Telemetry -Troponins -Check TSH, FT4 -TTEcho shows LVEF <20%, LV demonstrates global hypokinesis # Thromboembolism Risk Management with heparin for now will need anticoagulation as well for PE as above CHA2DS2-VAS Score = 3  -Rate controlled, holding Metoprolol due to hypotension. Will consider Amiodarone 163m IV bolus, then 112mmin x6 hrs, then 0.41m46min for 18 hours for rate management if remains hypotensive. -Cardiology Consultation   Hypertension Hx: CAD status post CABG, HLD  -Continuous cardiac monitoring -Maintain MAP greater than 65 -Hold Losartan and metoprolol for now int he setting of hypotension -Repeat 2D Echocardiogram shows LVEF <20%, LV demonstrates global hypokinesis   Diabetes mellitus -CBGs -Sliding scale insulin -Follow ICU hyper/hypoglycemia protocol -Hold home Metformin & Amaryl     Best practice:  Diet:  NPO Pain/Anxiety/Delirium protocol (if indicated): Yes (RASS goal -1) VAP protocol (if indicated): Not indicated DVT prophylaxis: Subcutaneous Heparin GI prophylaxis: PPI Glucose control:  SSI Yes Central venous access:  N/A Arterial line:  N/A Foley:  N/A Mobility:  bed rest  PT consulted: N/A Last date of multidisciplinary goals of care discussion [7/13] Code Status:  full code Disposition: FULL   = Goals of Care = Code Status Order: FULL  Primary Emergency Contact: [name],[name], Home Phone: [phone] next of kin above and she wishes to pursue full aggressive treatment and intervention options, including CPR and intubation.  Critical care time: 45 minutes     EliRufina FalcoNP, CCRN, FNP-C, AGACNP-BC Acute Care Nurse Practitioner  [facility] Pager: [phone] ARMDecatur [place]

## 2020-12-24 NOTE — Op Note (Signed)
[facility]  Percutaneous Study/Intervention Procedural Note   Date of Surgery: 12/24/2020,3:56 PM  Surgeon: Festus Barren  Pre-operative Diagnosis: Symptomatic bilateral pulmonary emboli  Post-operative diagnosis:  Same  Procedure(s) Performed:  1.  Contrast injection right heart  2.  Thrombolysis with 6 mg tPA to the pulmonary arteries  3.  Mechanical thrombectomy right lower lobe, right middle lobe, right upper lobe pulmonary arteries with the penumbra CAT 8 catheter as well as mechanical thrombectomy to the left lower and left upper lobe pulmonary arteries with the penumbra CAT 8 catheter  4.  Selective catheter placement right lower lobe, right middle lobe, and right upper lobe pulmonary artery  5.  Selective catheter placement left lower lobe and left after left heart    Anesthesia: Conscious sedation was administered under my direct supervision by the interventional radiology RN. IV Versed plus fentanyl were utilized. Continuous ECG, pulse oximetry and blood pressure was monitored throughout the entire procedure.  Versed and fentanyl were administered intravenously.  Conscious sedation was administered for a total of 39 minutes using 3 of Versed and 75 mcg of Fentanyl.  EBL: 400 cc  Sheath: 9 French right femoral vein  Contrast: 45 cc   Fluoroscopy Time: 12.2 minutes  Indications:  Patient presents with pulmonary emboli. The patient is symptomatic with hypoxemia and dyspnea on exertion.  He has a markedly dilated heart and significant tachycardia.  The patient is otherwise a good candidate for intervention and even the long-term benefits pulmonary angiography with thrombolysis is offered. The risks and benefits are reviewed long-term benefits are discussed. All questions are answered patient agrees to proceed.  Procedure:  Jeremy [name] a 65 y.o. male who was identified and appropriate procedural time out was performed.  The patient was then placed  supine on the table and prepped and draped in the usual sterile fashion.  Ultrasound was used to evaluate the right common femoral vein.  It was patent, as it was echolucent and compressible.  A digital ultrasound image was acquired for the permanent record.  A Seldinger needle was used to access the right common femoral vein under direct ultrasound guidance.  A 0.035 J wire was advanced without resistance and a 5Fr sheath was placed and then upsized to an 9 [place] sheath.    The wire and pigtail catheter were then negotiated into the right atrium and bolus injection of contrast was utilized to demonstrate the right ventricle and the pulmonary artery outflow. The wire and catheter were then negotiated into the main pulmonary artery where hand injection of contrast was utilized to demonstrate the pulmonary arteries and confirm the locations of the pulmonary emboli.  The image quality was quite poor.  I then advanced the JR4 catheter to the right side and perform selective imaging first of the right lower lobe, then in the right middle lobe, and then into the right upper lobe.  Even with selective catheter placement in the lobar pulmonary arteries, image quality was fairly poor due to continuous patient motion and breathing.  Selective imaging showed small amount of thrombus in the right lower lobe, a more significant amount of thrombus was seen in the right upper and middle lobes.  I then flipped the JR4 catheter to the left side.  The left lower lobe was cannulated first and then the left upper lobe was cannulated and selective injection performed.  Again, image quality was was quite poor but there appeared to be a moderate amount of thrombus in both vessels.  TPA was reconstituted and delivered onto the table. A total of 6 milligrams of TPA was utilized.  3 mg was administered on the left side and 3 mg was administered on the right side. This was then allowed to dwell.  The Penumbra Cat 8 catheter was then  advanced up into the pulmonary vasculature. The right lung was addressed first. Catheter was negotiated into the right lower lobe and mechanical thrombectomy was performed.  Passes were made with the penumbra catheter and the separator follow-up imaging demonstrated a good result and therefore the catheter was renegotiated into the right upper lobe pulmonary artery and again mechanical thrombectomy was performed. Passes were made with both the Penumbra catheter itself as well as introducing the separator. Follow-up imaging was then performed.  This seemed improved.  The CAT 8 catheter was then advanced into the right middle lobe and mechanical thrombectomy was performed again.  There appeared to be a small amount of residual thrombus after thrombectomy in the right middle lobe.  I then turned my attention to the left side.  The Penumbra Cat 8 catheter was then negotiated to the opposite side. The left lung was then addressed. Catheter was negotiated into the left lower lobe and mechanical thrombectomy was performed using the separator. Follow-up imaging demonstrated a good result and therefore the catheter was renegotiated into the left upper lobe pulmonary artery and again mechanical thrombectomy was performed. Passes were made with both the Penumbra catheter itself as well as introducing the separator. Follow-up imaging was then performed.  A small amount of residual thrombus appeared to remain, but at this point we worried about 400 cc of blood loss and the amount of thrombus remaining appeared to be significantly improved.  After review these images wires were reintroduced and the catheters removed. Then, the sheath is then pulled and pressures held. A safeguard is placed.    Findings:   Right heart imaging:  Right atrium and right ventricle and the pulmonary outflow tract appears quite dilated  Right lung: Image quality was fairly poor due to continuous patient motion and breathing.  Selective imaging  showed small amount of thrombus in the right lower lobe, a more significant amount of thrombus was seen in the right upper and middle lobes.  Left lung: Image quality was fairly poor with continuous patient motion and breathing, but there appeared to be significant thrombus in the left upper and left lower lobe pulmonary arteries.    Disposition: Patient was taken to the recovery room in stable condition having tolerated the procedure well.  Festus Barren 12/24/2020,3:56 PM

## 2020-12-24 NOTE — Progress Notes (Signed)
Patient back from thrombectomy procedure becoming agitated and irritable trying to get up stating he is "Ready to go,", pulling at PAD. Despite reassurance and education patient persisted, MD notified, new order for one time ativan given.

## 2020-12-24 NOTE — Progress Notes (Signed)
PROGRESS NOTE    Jeremy BottomMarshall [name]  ZOX:[id number] DOB: [birth year] DOA: 12/22/2020 PCP: Patient, No Pcp Per (Inactive)   Brief Narrative:   65 y.o. male with medical history significant of CAD status post CABG, HTN, IIDM, came with new onset of palpitations and shortness of breath.   His symptoms started 1 week ago, gradually getting worse.  Denies any chest pain, no fever or chills.  Symptoms got worse with activity, meantime he also developed mild cough with whitish phlegm.  Denies any recent travels, active tobacco use with 1 pack of cigarettes per week.  CT angiography of the thorax revealed acute pulmonary embolism with moderate clot burden.  Patient initially refused echocardiogram however now agreeable.  Bilateral lower extremity duplex negative for VTE.  Patient was started on heparin infusion and has remained hemodynamically stable.  However he remains visibly short of breath, tachypneic and tachycardic at rest.  As such I have requested consultation from vascular surgery who deemed him appropriate for pulmonary thrombectomy.  Procedure planned for 7/13.  Patient remains on heparin gtt.   Assessment & Plan:   Active Problems:   Pulmonary emboli (HCC)  Acute pulmonary embolism Moderate clot burden on CT, no right heart strain Echocardiogram initially refused, now agreeable Vascular ultrasound negative for VTE Vascular surgery on consult Plan: N.p.o. for pulmonary thrombectomy today Continue heparin infusion for now Vitals per unit protocol Oxygen if necessary Plan to transition to Xarelto at time of discharge.  Per pharmacy after benefits check this medication will be $4 a month.  Type 2 diabetes mellitus Sugars well controlled over interval Home metformin on hold Continue sliding-scale coverage Consider low-dose Lantus if glycemic control becomes challenging  Polysubstance abuse Tobacco abuse Patient cocaine positive, educated No indication withdrawal, discontinue  CIWA Educated on tobacco use.  Cessation advised.  Nicotine patch ordered  Elevated troponins Likely secondary to supply demand ischemia Now agreeable to TTE Continue cardiac monitoring Further recommendations if echo is abnormal  Coronary artery disease status post CABG Denies chest pain Continue telemetry monitoring   DVT prophylaxis: Heparin GTT Code Status: Full Family Communication: Offered to call.  Patient declined Disposition Plan: Status is: Inpatient  Remains inpatient appropriate because:Inpatient level of care appropriate due to severity of illness  Dispo: The patient is from: Home              Anticipated d/c is to: Home              Patient currently is not medically stable to d/c.   Difficult to place patient No  Acute pulmonary embolism.  Moderate clot burden.  Remains tachypneic and tachycardic.  Vascular surgery on consult we will perform pulmonary thrombectomy today.     Level of care: Med-Surg  Consultants:  Vascular surgery  Procedures:  Pulmonary thrombectomy 7/13  Antimicrobials:  None   Subjective: Patient seen and examined.  Does not complain of any chest pain.  Visibly tachypneic  Objective: Vitals:   12/24/20 0536 12/24/20 0746 12/24/20 0747 12/24/20 1134  BP: 124/89 119/88 119/88 130/85  Pulse: (!) 108 (!) 120 (!) 120 (!) 118  Resp: 20 (!) 22 (!) 22 (!) 24  Temp: 97.9 F (36.6 C) (!) 97.5 F (36.4 C) (!) 97.5 F (36.4 C)   TempSrc: Axillary Oral Oral   SpO2: 98% 99% 99%   Weight:      Height:        Intake/Output Summary (Last 24 hours) at 12/24/2020 1341 Last data filed at 12/24/2020 1233 Gross  per 24 hour  Intake 610.59 ml  Output 1200 ml  Net -589.41 ml   Filed Weights   12/22/20 1353  Weight: 117.9 kg    Examination:  General exam: No acute distress. Respiratory system: Tachypneic.  Lungs clear.  1 L Cardiovascular system: Tachycardic, regular rate and rhythm, no murmurs Gastrointestinal system: Obese,  nontender, nondistended, normal bowel sounds Central nervous system: Alert and oriented. No focal neurological deficits. Extremities: Symmetric 5 x 5 power. 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 Mood & affect appropriate.     Data Reviewed: I have personally reviewed following labs and imaging studies  CBC: Recent Labs  Lab 12/22/20 1421 12/23/20 0653 12/23/20 1521 12/24/20 0656  WBC 6.6 6.9 7.8 6.4  HGB 15.4 14.6 14.8 14.6  HCT 44.1 43.2 43.4 41.9  MCV 91.7 91.3 93.7 93.1  PLT 252 253 234 241   Basic Metabolic Panel: Recent Labs  Lab 12/22/20 1421 12/23/20 1521 12/24/20 0656  NA 139  --  140  K 4.1  --  4.2  CL 111  --  108  CO2 21*  --  25  GLUCOSE 136*  --  170*  BUN 13  --  18  CREATININE 1.04  --  1.25*  CALCIUM 9.2  --  9.7  MG  --  2.0  --   PHOS  --  3.7  --    GFR: Estimated Creatinine Clearance: 79 mL/min (A) (by C-G formula based on SCr of 1.25 mg/dL (H)). Liver Function Tests: No results for input(s): AST, ALT, ALKPHOS, BILITOT, PROT, ALBUMIN in the last 168 hours. No results for input(s): LIPASE, AMYLASE in the last 168 hours. No results for input(s): AMMONIA in the last 168 hours. Coagulation Profile: Recent Labs  Lab 12/22/20 1500  INR 1.0   Cardiac Enzymes: No results for input(s): CKTOTAL, CKMB, CKMBINDEX, TROPONINI in the last 168 hours. BNP (last 3 results) No results for input(s): PROBNP in the last 8760 hours. HbA1C: Recent Labs    12/22/20 2257  HGBA1C 8.7*   CBG: Recent Labs  Lab 12/23/20 1156 12/23/20 1651 12/23/20 2053 12/24/20 0747 12/24/20 1133  GLUCAP 177* 157* 127* 174* 208*   Lipid Profile: No results for input(s): CHOL, HDL, LDLCALC, TRIG, CHOLHDL, LDLDIRECT in the last 72 hours. Thyroid Function Tests: No results for input(s): TSH, T4TOTAL, FREET4, T3FREE, THYROIDAB in the last 72 hours. Anemia Panel: No results for input(s): VITAMINB12, FOLATE, FERRITIN, TIBC,  IRON, RETICCTPCT in the last 72 hours. Sepsis Labs: Recent Labs  Lab 12/22/20 1421  PROCALCITON <0.10    Recent Results (from the past 240 hour(s))  Resp Panel by RT-PCR (Flu A&B, Covid) Nasopharyngeal Swab     Status: None   Collection Time: 12/22/20  2:21 PM   Specimen: Nasopharyngeal Swab; Nasopharyngeal(NP) swabs in vial transport medium  Result Value Ref Range Status   SARS Coronavirus 2 by RT PCR NEGATIVE NEGATIVE Final    Comment: (NOTE) SARS-CoV-2 target nucleic acids are NOT DETECTED.  The SARS-CoV-2 RNA is generally detectable in upper respiratory specimens during the acute phase of infection. The lowest concentration of SARS-CoV-2 viral copies this assay can detect is 138 copies/mL. A negative result does not preclude SARS-Cov-2 infection and should not be used as the sole basis for treatment or other patient management decisions. A negative result may occur with  improper specimen collection/handling, submission of specimen other than nasopharyngeal swab, presence of viral mutation(s) within the areas targeted by this  assay, and inadequate number of viral copies(<138 copies/mL). A negative result must be combined with clinical observations, patient history, and epidemiological information. The expected result is Negative.  Fact Sheet for Patients:  BloggerCourse.com  Fact Sheet for Healthcare Providers:  SeriousBroker.it  This test is no t yet approved or cleared by the [place] FDA and  has been authorized for detection and/or diagnosis of SARS-CoV-2 by FDA under an Emergency Use Authorization (EUA). This EUA will remain  in effect (meaning this test can be used) for the duration of the COVID-19 declaration under Section 564(b)(1) of the Act, 21 U.S.C.section 360bbb-3(b)(1), unless the authorization is terminated  or revoked sooner.       Influenza A by PCR NEGATIVE NEGATIVE Final   Influenza B by PCR  NEGATIVE NEGATIVE Final    Comment: (NOTE) The Xpert Xpress SARS-CoV-2/FLU/RSV plus assay is intended as an aid in the diagnosis of influenza from Nasopharyngeal swab specimens and should not be used as a sole basis for treatment. Nasal washings and aspirates are unacceptable for Xpert Xpress SARS-CoV-2/FLU/RSV testing.  Fact Sheet for Patients: BloggerCourse.com  Fact Sheet for Healthcare Providers: SeriousBroker.it  This test is not yet approved or cleared by the [place] FDA and has been authorized for detection and/or diagnosis of SARS-CoV-2 by FDA under an Emergency Use Authorization (EUA). This EUA will remain in effect (meaning this test can be used) for the duration of the COVID-19 declaration under Section 564(b)(1) of the Act, 21 U.S.C. section 360bbb-3(b)(1), unless the authorization is terminated or revoked.  Performed at [facility], [address], [place], [place] [address]          Radiology Studies: DG Chest 2 View  Result Date: 12/22/2020 CLINICAL DATA:  Dyspnea, palpitations EXAM: CHEST - 2 VIEW COMPARISON:  02/12/2018 chest radiograph. FINDINGS: Intact sternotomy wires. Stable cardiomediastinal silhouette with mild cardiomegaly. No pneumothorax. No pleural effusion. Mild pulmonary edema. No acute consolidative airspace disease. IMPRESSION: Mild congestive heart failure. Electronically Signed   By: Delbert Phenix M.D.   On: 12/22/2020 14:54   CT Angio Chest PE W and/or Wo Contrast  Result Date: 12/22/2020 CLINICAL DATA:  Palpitations, chronic cough EXAM: CT ANGIOGRAPHY CHEST WITH CONTRAST TECHNIQUE: Multidetector CT imaging of the chest was performed using the standard protocol during bolus administration of intravenous contrast. Multiplanar CT image reconstructions and MIPs were obtained to evaluate the vascular anatomy. CONTRAST:  60mL OMNIPAQUE IOHEXOL 350 MG/ML SOLN COMPARISON:  Chest  radiograph from earlier today. 10/02/2011 chest CT. FINDINGS: Cardiovascular: The study is high quality for the evaluation of pulmonary embolism. No saddle embolus. Acute moderate clot burden bilateral pulmonary emboli including segmental and subsegmental left upper and left lower lobe branches, distal right pulmonary artery and lobar, segmental and subsegmental right upper lobe branches. Atherosclerotic nonaneurysmal thoracic aorta. Top-normal caliber main pulmonary artery (3.1 cm diameter). Mild-to-moderate cardiomegaly. No significant pericardial fluid/thickening. RV/LV ratio 0.76, within normal limits. Mediastinum/Nodes: No discrete thyroid nodules. Unremarkable esophagus. No pathologically enlarged axillary, mediastinal or hilar lymph nodes. Lungs/Pleura: No pneumothorax. No pleural effusion. A small portion of the dependent right lung base was inadvertently excluded from the scan. Posterior right middle lobe 1.0 cm solid pulmonary nodule (series 6/image 62), not substantially changed since 02/10/2018 CT abdomen study, considered benign. No acute consolidative airspace disease, lung masses or additional significant pulmonary nodules. Upper abdomen: Mild contrast reflux into the IVC and hepatic veins. Musculoskeletal: No aggressive appearing focal osseous lesions. Intact sternotomy wires. Mild thoracic spondylosis. Review of the  MIP images confirms the above findings. IMPRESSION: 1. Acute moderate clot burden bilateral pulmonary emboli, extending proximally to the distal right pulmonary artery level. No saddle embolus. Normal RV/LV ratio. 2. Mild-to-moderate cardiomegaly. Mild contrast reflux into the IVC and hepatic veins. 3. Aortic Atherosclerosis (ICD10-I70.0). Critical Value/emergent results were called by telephone at the time of interpretation on 12/22/2020 at 4:57 pm to provider Donna Bernard, MD, who verbally acknowledged these results. Electronically Signed   By: Delbert Phenix M.D.   On: 12/22/2020 17:00    US Venous Img Lower Bilateral (DVT)  Result Date: 12/23/2020 CLINICAL DATA:  Known pulmonary emboli. EXAM: BILATERAL LOWER EXTREMITY VENOUS DOPPLER ULTRASOUND TECHNIQUE: Gray-scale sonography with graded compression, as well as color Doppler and duplex ultrasound were performed to evaluate the lower extremity deep venous systems from the level of the common femoral vein and including the common femoral, femoral, profunda femoral, popliteal and calf veins including the posterior tibial, peroneal and gastrocnemius veins when visible. The superficial great saphenous vein was also interrogated. Spectral Doppler was utilized to evaluate flow at rest and with distal augmentation maneuvers in the common femoral, femoral and popliteal veins. COMPARISON:  None. FINDINGS: RIGHT LOWER EXTREMITY Common Femoral Vein: No evidence of thrombus. Normal compressibility, respiratory phasicity and response to augmentation. Saphenofemoral Junction: No evidence of thrombus. Normal compressibility and flow on color Doppler imaging. Profunda Femoral Vein: No evidence of thrombus. Normal compressibility and flow on color Doppler imaging. Femoral Vein: No evidence of thrombus. Normal compressibility, respiratory phasicity and response to augmentation. Popliteal Vein: No evidence of thrombus. Normal compressibility, respiratory phasicity and response to augmentation. Calf Veins: No evidence of thrombus. Normal compressibility and flow on color Doppler imaging. Superficial Great Saphenous Vein: No evidence of thrombus. Normal compressibility. Venous Reflux:  None. Other Findings:  None. LEFT LOWER EXTREMITY Common Femoral Vein: No evidence of thrombus. Normal compressibility, respiratory phasicity and response to augmentation. Saphenofemoral Junction: No evidence of thrombus. Normal compressibility and flow on color Doppler imaging. Profunda Femoral Vein: No evidence of thrombus. Normal compressibility and flow on color Doppler imaging.  Femoral Vein: No evidence of thrombus. Normal compressibility, respiratory phasicity and response to augmentation. Popliteal Vein: No evidence of thrombus. Normal compressibility, respiratory phasicity and response to augmentation. Calf Veins: No evidence of thrombus. Normal compressibility and flow on color Doppler imaging. Superficial Great Saphenous Vein: No evidence of thrombus. Normal compressibility. Venous Reflux:  None. Other Findings:  None. IMPRESSION: No evidence of deep venous thrombosis in either lower extremity. Electronically Signed   By: Alcide Clever M.D.   On: 12/23/2020 02:56        Scheduled Meds:  folic acid  1 mg Oral Daily   insulin aspart  0-15 Units Subcutaneous TID WC   insulin aspart  0-5 Units Subcutaneous QHS   metoprolol tartrate  25 mg Oral BID   multivitamin with minerals  1 tablet Oral Daily   nicotine  21 mg Transdermal Daily   thiamine  100 mg Oral Daily   Or   thiamine  100 mg Intravenous Daily   Continuous Infusions:  sodium chloride     heparin 1,550 Units/hr (12/24/20 0912)     LOS: 2 days    Time spent: 35 minutes    Tresa Moore, MD [facility] Pager [phone]  If 7PM-7AM, please contact night-coverage 12/24/2020, 1:41 PM

## 2020-12-24 NOTE — Progress Notes (Signed)
*  PRELIMINARY RESULTS* Echocardiogram 2D Echocardiogram has been performed.  Cristela Blue 12/24/2020, 2:07 PM

## 2020-12-24 NOTE — Consult Note (Addendum)
WOC Nurse Consult Note: Reason for Consult: Consult requested for right ankle wound.  Pt states he previously had infected hardware to this location which has been removed and he has a chronic full thickness wound. Right outer ankle with chronic full thickness wound; .3X3X.3cm, red with mod amt tan drainage, surrounded by pink dry scar tissue.  Dressing procedure/placement/frequency: Pt has been applying packing prior to admission and is familiar with dressing changes.  He requests that I do not apply a dressing at this times, since he wants to take a shower. Instructions provided and supplies at the bedside and pt states he will apply the dressing after his shower.   Topical treatment orders provided as follows: Cut piece of Aquacel and tuck into right ankle wound Q day, using swab to fill, then cover with foam dressing.  (Change foam dressing Q 3 days or PRN soiling.). Please re-consult if further assistance is needed.  Thank-you,  Cammie Mcgee MSN, RN, CWOCN, [place], CNS [phone]

## 2020-12-24 NOTE — Progress Notes (Signed)
ANTICOAGULATION CONSULT NOTE  Pharmacy Consult for heparin Indication: chest pain/ACS  Allergies  Allergen Reactions   Amoxicillin Swelling   Keflex [Cephalexin] Rash    Patient Measurements: Height: 5\' 11"  (180.3 cm) Weight: 117.9 kg (260 lb) IBW/kg (Calculated) : 75.3 Heparin Dosing Weight: 101 kg  Vital Signs: Temp: 97.5 F (36.4 C) (07/13 0747) Temp Source: Oral (07/13 0747) BP: 119/88 (07/13 0747) Pulse Rate: 120 (07/13 0747)  Labs: Recent Labs    12/22/20 1421 12/22/20 1500 12/22/20 1721 12/22/20 2257 12/23/20 0653 12/23/20 1521 12/24/20 0656  HGB 15.4  --   --   --  14.6 14.8 14.6  HCT 44.1  --   --   --  43.2 43.4 41.9  PLT 252  --   --   --  253 234 241  APTT  --  30  --   --   --   --   --   LABPROT  --  13.0  --   --   --   --   --   INR  --  1.0  --   --   --   --   --   HEPARINUNFRC  --   --   --  0.37 0.35  --  0.25*  CREATININE 1.04  --   --   --   --   --  1.25*  TROPONINIHS 74*  --  76*  --   --   --   --      Estimated Creatinine Clearance: 79 mL/min (A) (by C-G formula based on SCr of 1.25 mg/dL (H)).   Medical History: Past Medical History:  Diagnosis Date   Coronary artery disease    Diabetes mellitus without complication (HCC)    Hypertension    S/P CABG x 1     Assessment: 65 year old male w/ h/o CAD (s/p CABG), HTN, IIDM, came with new onset of palpitations and SOB. Elevation in troponin concerning for NSTEMI, w/ further w/u revealing acute Bil PE. Pharmacy consulted for mgmt of heparin drip.  7/11: CTA positive for acute moderate clot burden bilateral pulmonary emboli no saddle embolus.  Mild to moderate cardiomegaly.  No signs of right heart strain.  Normal RV/LV ratio.  Goal of Therapy:  Heparin level 0.3-0.7 units/ml Monitor platelets by anticoagulation protocol: Yes  Date Time aPTT/HL Rate/Comment 07/11 2257     0.37  thera x1; 1350 un/hr 07/11 0653     0.35  Thera x2; 1350 un/hr 07/12 0656     0.25  Subthera; 1350 >      Baseline Labs: aPTT - 30s INR - 1.0 Hgb - 15.4>14.6 Plts - 252>253 Trop 74>76  Plan:  Subtherapeutic anti-xa level this AM. Anticipating IV > PO AC switch in 24hrs. Xarelto or Eliquis both preferred options on pts insurance plan. Favoring xarelto for once daily dosing. Troponins positive but flat Bolus 1500 units from infusion x1; then increase heparin infusion rate to 1550 units/hr Recheck HL in 8hrs after rate change. CTM CBC daily while on heparin infusion  09/12, [facility] 12/24/2020 8:46 AM

## 2020-12-24 NOTE — Progress Notes (Signed)
Patient got up from bed just now and ended up urinating in the trashcan at the nursing station. He was then trying to get into another patients room and we were trying to stop him, but he then got combative and was swinging his arms at the nurses/techs. it took 4 of [place] to take him back to his room and security was called to be sure he didn't get worst. they were currently in the patients room at the time. He is unstable on his feet and doesn't let us help him. Night On call MD was notified and orders were placed to have him transferred to the ICU.

## 2020-12-24 NOTE — Consult Note (Signed)
[facility] Vascular Consult Note  MRN : [id number]  Jeremy [name] is a 65 y.o. (1956/01/28) male who presents with chief complaint of  Chief Complaint  Patient presents with   Shortness of Breath   History of Present Illness:  Jeremy [name] is a 65 year old male with medical history significant of CAD status post CABG, HTN, IIDM, came with new onset of palpitations and shortness of breath.    His symptoms started one week ago, gradually getting worse.  Denies any chest pain, no fever or chills.  Symptoms got worse with activity, meantime he also developed mild cough with whitish phlegm.  Denies any recent travels, small one pack cigarettes per week.  Patient denies recent surgery / trauma, travel, recent COVID or known clotting disorder.   CTA Chest: positive for acute moderate clot burden bilateral pulmonary emboli no saddle embolus.  Mild to moderate cardiomegaly.  Vascular Surgery was consulted by Dr. Georgeann Oppenheim for possible endovascular intervention.   Current Facility-Administered Medications  Medication Dose Route Frequency Provider Last Rate Last Admin   0.9 %  sodium chloride infusion   Intravenous Continuous Sarayah Bacchi A, PA-C       acetaminophen (TYLENOL) tablet 650 mg  650 mg Oral Q6H PRN Mikey College T, MD   650 mg at 12/24/20 1102   Or   acetaminophen (TYLENOL) suppository 650 mg  650 mg Rectal Q6H PRN Mikey College T, MD       folic acid (FOLVITE) tablet 1 mg  1 mg Oral Daily Lynn Ito, MD   1 mg at 12/24/20 0813   heparin ADULT infusion 100 units/mL (25000 units/266mL)  1,550 Units/hr Intravenous Continuous Martyn Malay, RPH 15.5 mL/hr at 12/24/20 0912 1,550 Units/hr at 12/24/20 0912   HYDROcodone-acetaminophen (NORCO/VICODIN) 5-325 MG per tablet 1-2 tablet  1-2 tablet Oral Q4H PRN Mikey College T, MD   2 tablet at 12/24/20 0543   insulin aspart (novoLOG) injection 0-15 Units  0-15 Units Subcutaneous TID WC Andris Baumann, MD   5  Units at 12/24/20 1207   insulin aspart (novoLOG) injection 0-5 Units  0-5 Units Subcutaneous QHS Andris Baumann, MD   2 Units at 12/22/20 2223   LORazepam (ATIVAN) tablet 1-4 mg  1-4 mg Oral Q1H PRN Lynn Ito, MD       Or   LORazepam (ATIVAN) injection 1-4 mg  1-4 mg Intravenous Q1H PRN Lynn Ito, MD       metoprolol tartrate (LOPRESSOR) tablet 25 mg  25 mg Oral BID Mikey College T, MD   25 mg at 12/24/20 0813   multivitamin with minerals tablet 1 tablet  1 tablet Oral Daily Lynn Ito, MD   1 tablet at 12/24/20 8921   nicotine (NICODERM CQ - dosed in mg/24 hours) patch 21 mg  21 mg Transdermal Daily Lynn Ito, MD   21 mg at 12/24/20 0813   ondansetron (ZOFRAN) tablet 4 mg  4 mg Oral Q6H PRN Emeline General, MD       Or   ondansetron [facility]) injection 4 mg  4 mg Intravenous Q6H PRN Mikey College T, MD       senna-docusate (Senokot-S) tablet 1 tablet  1 tablet Oral QHS PRN Mikey College T, MD       thiamine tablet 100 mg  100 mg Oral Daily Lynn Ito, MD   100 mg at 12/24/20 0813   Or   thiamine (B-1) injection 100 mg  100 mg Intravenous Daily  Lynn Ito, MD       Past Medical History:  Diagnosis Date   Coronary artery disease    Diabetes mellitus without complication (HCC)    Hypertension    S/P CABG x 1    Past Surgical History:  Procedure Laterality Date   ANKLE ARTHROSCOPY W/ OPEN REPAIR Right    CORONARY ARTERY BYPASS GRAFT     Social History Social History   Tobacco Use   Smoking status: Some Days    Packs/day: 0.50    Pack years: 0.00    Types: Cigarettes   Smokeless tobacco: Former  Building services engineer Use: Never used  Substance Use Topics   Alcohol use: Not Currently   Drug use: Yes    Types: Marijuana   Family History Family History  Problem Relation Age of Onset   Heart attack Father   Denies family history of PAD, venous or renal disease.  Allergies  Allergen Reactions   Amoxicillin Swelling   Keflex [Cephalexin] Rash   REVIEW OF SYSTEMS  (Negative unless checked)  Constitutional: [] Weight loss  [] Fever  [] Chills Cardiac: [] Chest pain   [] Chest pressure   [] Palpitations   [x] Shortness of breath when laying flat   [x] Shortness of breath at rest   [x] Shortness of breath with exertion. Vascular:  [] Pain in legs with walking   [] Pain in legs at rest   [] Pain in legs when laying flat   [] Claudication   [] Pain in feet when walking  [] Pain in feet at rest  [] Pain in feet when laying flat   [] History of DVT   [] Phlebitis   [] Swelling in legs   [] Varicose veins   [] Non-healing ulcers Pulmonary:   [] Uses home oxygen   [] Productive cough   [] Hemoptysis   [] Wheeze  [] COPD   [] Asthma Neurologic:  [] Dizziness  [] Blackouts   [] Seizures   [] History of stroke   [] History of TIA  [] Aphasia   [] Temporary blindness   [] Dysphagia   [] Weakness or numbness in arms   [] Weakness or numbness in legs Musculoskeletal:  [] Arthritis   [] Joint swelling   [] Joint pain   [] Low back pain Hematologic:  [] Easy bruising  [] Easy bleeding   [] Hypercoagulable state   [] Anemic  [] Hepatitis Gastrointestinal:  [] Blood in stool   [] Vomiting blood  [] Gastroesophageal reflux/heartburn   [] Difficulty swallowing. Genitourinary:  [] Chronic kidney disease   [] Difficult urination  [] Frequent urination  [] Burning with urination   [] Blood in urine Skin:  [] Rashes   [] Ulcers   [] Wounds Psychological:  [] History of anxiety   []  History of major depression.  Physical Examination  Vitals:   12/24/20 0536 12/24/20 0746 12/24/20 0747 12/24/20 1134  BP: 124/89 119/88 119/88 130/85  Pulse: (!) 108 (!) 120 (!) 120 (!) 118  Resp: 20 (!) 22 (!) 22 (!) 24  Temp: 97.9 F (36.6 C) (!) 97.5 F (36.4 C) (!) 97.5 F (36.4 C)   TempSrc: Axillary Oral Oral   SpO2: 98% 99% 99%   Weight:      Height:       Body mass index is 36.26 kg/m. Gen:  WD/WN, NAD Head: [place]/AT, No temporalis wasting. Prominent temp pulse not noted. Ear/Nose/Throat: Hearing grossly intact, nares w/o erythema or  drainage, oropharynx w/o Erythema/Exudate Eyes: Sclera non-icteric, conjunctiva clear Neck: Trachea midline.  No JVD.  Pulmonary:  Patient is visibly short of breath with speaking. Cardiac: RRR, normal S1, S2. Vascular:  Vessel Right Left  Radial Palpable Palpable  Ulnar Palpable Palpable  Gastrointestinal: soft, non-tender/non-distended. No guarding/reflex.  Musculoskeletal: M/S 5/5 throughout.  Extremities without ischemic changes.  No deformity or atrophy. No edema. Neurologic: Sensation grossly intact in extremities.  Symmetrical.  Speech is fluent. Motor exam as listed above. Psychiatric: Judgment intact, Mood & affect appropriate for pt's clinical situation. Dermatologic: No rashes or ulcers noted.  No cellulitis or open wounds. Lymph : No Cervical, Axillary, or Inguinal lymphadenopathy.  CBC Lab Results  Component Value Date   WBC 6.4 12/24/2020   HGB 14.6 12/24/2020   HCT 41.9 12/24/2020   MCV 93.1 12/24/2020   PLT 241 12/24/2020   BMET    Component Value Date/Time   NA 140 12/24/2020 0656   NA 146 (H) 10/03/2011 0533   K 4.2 12/24/2020 0656   K 3.8 10/03/2011 0533   CL 108 12/24/2020 0656   CL 111 (H) 10/03/2011 0533   CO2 25 12/24/2020 0656   CO2 24 10/03/2011 0533   GLUCOSE 170 (H) 12/24/2020 0656   GLUCOSE 107 (H) 10/03/2011 0533   BUN 18 12/24/2020 0656   BUN 8 10/03/2011 0533   CREATININE 1.25 (H) 12/24/2020 0656   CREATININE 1.11 10/03/2011 0533   CALCIUM 9.7 12/24/2020 0656   CALCIUM 8.7 10/03/2011 0533   GFRNONAA >60 12/24/2020 0656   GFRNONAA >60 10/03/2011 0533   GFRAA >60 09/02/2019 1534   GFRAA >60 10/03/2011 0533   Estimated Creatinine Clearance: 79 mL/min (A) (by C-G formula based on SCr of 1.25 mg/dL (H)).  COAG Lab Results  Component Value Date   INR 1.0 12/22/2020   INR 0.9 10/02/2011   Radiology DG Chest 2 View  Result Date: 12/22/2020 CLINICAL DATA:  Dyspnea, palpitations EXAM: CHEST - 2 VIEW  COMPARISON:  02/12/2018 chest radiograph. FINDINGS: Intact sternotomy wires. Stable cardiomediastinal silhouette with mild cardiomegaly. No pneumothorax. No pleural effusion. Mild pulmonary edema. No acute consolidative airspace disease. IMPRESSION: Mild congestive heart failure. Electronically Signed   By: Delbert Phenix M.D.   On: 12/22/2020 14:54   CT Angio Chest PE W and/or Wo Contrast  Result Date: 12/22/2020 CLINICAL DATA:  Palpitations, chronic cough EXAM: CT ANGIOGRAPHY CHEST WITH CONTRAST TECHNIQUE: Multidetector CT imaging of the chest was performed using the standard protocol during bolus administration of intravenous contrast. Multiplanar CT image reconstructions and MIPs were obtained to evaluate the vascular anatomy. CONTRAST:  75mL OMNIPAQUE IOHEXOL 350 MG/ML SOLN COMPARISON:  Chest radiograph from earlier today. 10/02/2011 chest CT. FINDINGS: Cardiovascular: The study is high quality for the evaluation of pulmonary embolism. No saddle embolus. Acute moderate clot burden bilateral pulmonary emboli including segmental and subsegmental left upper and left lower lobe branches, distal right pulmonary artery and lobar, segmental and subsegmental right upper lobe branches. Atherosclerotic nonaneurysmal thoracic aorta. Top-normal caliber main pulmonary artery (3.1 cm diameter). Mild-to-moderate cardiomegaly. No significant pericardial fluid/thickening. RV/LV ratio 0.76, within normal limits. Mediastinum/Nodes: No discrete thyroid nodules. Unremarkable esophagus. No pathologically enlarged axillary, mediastinal or hilar lymph nodes. Lungs/Pleura: No pneumothorax. No pleural effusion. A small portion of the dependent right lung base was inadvertently excluded from the scan. Posterior right middle lobe 1.0 cm solid pulmonary nodule (series 6/image 62), not substantially changed since 02/10/2018 CT abdomen study, considered benign. No acute consolidative airspace disease, lung masses or additional significant  pulmonary nodules. Upper abdomen: Mild contrast reflux into the IVC and hepatic veins. Musculoskeletal: No aggressive appearing focal osseous lesions. Intact sternotomy wires. Mild thoracic spondylosis. Review of the MIP images confirms the above findings. IMPRESSION: 1. Acute moderate clot burden  bilateral pulmonary emboli, extending proximally to the distal right pulmonary artery level. No saddle embolus. Normal RV/LV ratio. 2. Mild-to-moderate cardiomegaly. Mild contrast reflux into the IVC and hepatic veins. 3. Aortic Atherosclerosis (ICD10-I70.0). Critical Value/emergent results were called by telephone at the time of interpretation on 12/22/2020 at 4:57 pm to provider Donna Bernard, MD, who verbally acknowledged these results. Electronically Signed   By: Delbert Phenix M.D.   On: 12/22/2020 17:00   US Venous Img Lower Bilateral (DVT)  Result Date: 12/23/2020 CLINICAL DATA:  Known pulmonary emboli. EXAM: BILATERAL LOWER EXTREMITY VENOUS DOPPLER ULTRASOUND TECHNIQUE: Gray-scale sonography with graded compression, as well as color Doppler and duplex ultrasound were performed to evaluate the lower extremity deep venous systems from the level of the common femoral vein and including the common femoral, femoral, profunda femoral, popliteal and calf veins including the posterior tibial, peroneal and gastrocnemius veins when visible. The superficial great saphenous vein was also interrogated. Spectral Doppler was utilized to evaluate flow at rest and with distal augmentation maneuvers in the common femoral, femoral and popliteal veins. COMPARISON:  None. FINDINGS: RIGHT LOWER EXTREMITY Common Femoral Vein: No evidence of thrombus. Normal compressibility, respiratory phasicity and response to augmentation. Saphenofemoral Junction: No evidence of thrombus. Normal compressibility and flow on color Doppler imaging. Profunda Femoral Vein: No evidence of thrombus. Normal compressibility and flow on color Doppler imaging.  Femoral Vein: No evidence of thrombus. Normal compressibility, respiratory phasicity and response to augmentation. Popliteal Vein: No evidence of thrombus. Normal compressibility, respiratory phasicity and response to augmentation. Calf Veins: No evidence of thrombus. Normal compressibility and flow on color Doppler imaging. Superficial Great Saphenous Vein: No evidence of thrombus. Normal compressibility. Venous Reflux:  None. Other Findings:  None. LEFT LOWER EXTREMITY Common Femoral Vein: No evidence of thrombus. Normal compressibility, respiratory phasicity and response to augmentation. Saphenofemoral Junction: No evidence of thrombus. Normal compressibility and flow on color Doppler imaging. Profunda Femoral Vein: No evidence of thrombus. Normal compressibility and flow on color Doppler imaging. Femoral Vein: No evidence of thrombus. Normal compressibility, respiratory phasicity and response to augmentation. Popliteal Vein: No evidence of thrombus. Normal compressibility, respiratory phasicity and response to augmentation. Calf Veins: No evidence of thrombus. Normal compressibility and flow on color Doppler imaging. Superficial Great Saphenous Vein: No evidence of thrombus. Normal compressibility. Venous Reflux:  None. Other Findings:  None. IMPRESSION: No evidence of deep venous thrombosis in either lower extremity. Electronically Signed   By: Alcide Clever M.D.   On: 12/23/2020 02:56    Assessment/Plan Jeremy [name] is a 65 year old male with medical history significant of CAD status post CABG, HTN, IIDM, came with new onset of palpitations and shortness of breath found to have PE on CTA chest.  1.  Pulmonary Embolism: Patient presented to the [facility] emergency department with progressively worsening shortness of breath.  Patient found to have acute PE on CTA of the chest.  Patient was initiated on heparin however continues to experience significant shortness of breath.  The  patient is visibly short of breath while talking.  Denies recent surgery/trauma, prolonged immobility, recent COVID infection or known clotting disorder.  In the setting of progressively worsening shortness of breath, tachycardia with acute moderate clot burden extending proximally to the distal right pulmonary artery recommend the patient undergo a pulmonary thrombectomy/thrombolysis in an attempt to assess the patient's anatomy, lessen the clot burden, improve his shortness of breath and tachycardia.  Procedure, some benefits were explained to the patient.  All  questions were answered.  The patient was to proceed.  We will plan on this this afternoon with Dr. Wyn Quakerew.  2.  Deep Vein Thrombosis: On December 24, 2019 bilateral venous duplex was notable for no evidence of DVT thrombosis.  3.  Tobacco abuse: We had a discussion for approximately three minutes regarding the absolute need for smoking cessation due to the deleterious nature of tobacco on the vascular system. We discussed the tobacco use would diminish patency of any intervention, and likely significantly worsen progressio of disease. We discussed multiple agents for quitting including replacement therapy or medications to reduce cravings such as Chantix. The patient voices their understanding of the importance of smoking cessation.   Discussed with Dr. Weldon Inchesew  Serra Younan A Kacie Huxtable, PA-C  12/24/2020 1:08 PM  This note was created with Dragon medical transcription system.  Any error is purely unintentional.

## 2020-12-24 NOTE — Progress Notes (Signed)
Pt arrived to unit from PCU RM 245. Pt extremely agitated and being held in bed by 5 security guards. Safety mitts placed on pt. Pt continues to swat and yell at security guards and nurses. After placing pt on monitor, VSS on room air. Precedex gtt ordered but pt has no working IV access at this time. IV access attempted several times but unsuccessful d/t pt agitation. Versed IM administered. IV access obtained later and precedex gtt started. Bilateral soft wrist restraints placed on patient as well.

## 2020-12-25 ENCOUNTER — Inpatient Hospital Stay: Payer: Medicaid Other

## 2020-12-25 ENCOUNTER — Encounter: Payer: Self-pay | Admitting: Vascular Surgery

## 2020-12-25 DIAGNOSIS — I2694 Multiple subsegmental pulmonary emboli without acute cor pulmonale: Secondary | ICD-10-CM | POA: Diagnosis not present

## 2020-12-25 LAB — BASIC METABOLIC PANEL
Anion gap: 11 (ref 5–15)
Anion gap: 9 (ref 5–15)
BUN: 21 mg/dL (ref 8–23)
BUN: 25 mg/dL — ABNORMAL HIGH (ref 8–23)
CO2: 20 mmol/L — ABNORMAL LOW (ref 22–32)
CO2: 20 mmol/L — ABNORMAL LOW (ref 22–32)
Calcium: 9.1 mg/dL (ref 8.9–10.3)
Calcium: 9 mg/dL (ref 8.9–10.3)
Chloride: 109 mmol/L (ref 98–111)
Chloride: 112 mmol/L — ABNORMAL HIGH (ref 98–111)
Creatinine, Ser: 1.49 mg/dL — ABNORMAL HIGH (ref 0.61–1.24)
Creatinine, Ser: 1.65 mg/dL — ABNORMAL HIGH (ref 0.61–1.24)
GFR, Estimated: 52 mL/min — ABNORMAL LOW (ref >60)
GFR, Estimated: 46 mL/min — ABNORMAL LOW (ref >60)
Glucose, Bld: 178 mg/dL — ABNORMAL HIGH (ref 70–99)
Glucose, Bld: 184 mg/dL — ABNORMAL HIGH (ref 70–99)
Potassium: 5.2 mmol/L — ABNORMAL HIGH (ref 3.5–5.1)
Potassium: 5.1 mmol/L (ref 3.5–5.1)
Sodium: 140 mmol/L (ref 135–145)
Sodium: 141 mmol/L (ref 135–145)

## 2020-12-25 LAB — CBC WITH DIFFERENTIAL/PLATELET
Abs Immature Granulocytes: 0.03 10*3/uL (ref 0.00–0.07)
Basophils Absolute: 0 10*3/uL (ref 0.0–0.1)
Basophils Relative: 1 %
Eosinophils Absolute: 0.1 10*3/uL (ref 0.0–0.5)
Eosinophils Relative: 2 %
HCT: 42.4 % (ref 39.0–52.0)
Hemoglobin: 13.9 g/dL (ref 13.0–17.0)
Immature Granulocytes: 1 %
Lymphocytes Relative: 35 %
Lymphs Abs: 2.1 10*3/uL (ref 0.7–4.0)
MCH: 31.4 pg (ref 26.0–34.0)
MCHC: 32.8 g/dL (ref 30.0–36.0)
MCV: 95.7 fL (ref 80.0–100.0)
Monocytes Absolute: 0.6 10*3/uL (ref 0.1–1.0)
Monocytes Relative: 10 %
Neutro Abs: 3.2 10*3/uL (ref 1.7–7.7)
Neutrophils Relative %: 51 %
Platelets: 178 10*3/uL (ref 150–400)
RBC: 4.43 MIL/uL (ref 4.22–5.81)
RDW: 13.5 % (ref 11.5–15.5)
WBC: 6 10*3/uL (ref 4.0–10.5)
nRBC: 0 % (ref 0.0–0.2)

## 2020-12-25 LAB — BLOOD GAS, ARTERIAL
Acid-base deficit: 6.3 mmol/L — ABNORMAL HIGH (ref 0.0–2.0)
Bicarbonate: 19.7 mmol/L — ABNORMAL LOW (ref 20.0–28.0)
FIO2: 0.3
MECHVT: 500 mL
O2 Saturation: 94.7 %
PEEP: 5 cmH2O
Patient temperature: 37
RATE: 15 resp/min
pCO2 arterial: 40 mmHg (ref 32.0–48.0)
pH, Arterial: 7.3 — ABNORMAL LOW (ref 7.350–7.450)
pO2, Arterial: 82 mmHg — ABNORMAL LOW (ref 83.0–108.0)

## 2020-12-25 LAB — MRSA NEXT GEN BY PCR, NASAL: MRSA by PCR Next Gen: NOT DETECTED

## 2020-12-25 LAB — GLUCOSE, CAPILLARY
Glucose-Capillary: 127 mg/dL — ABNORMAL HIGH (ref 70–99)
Glucose-Capillary: 154 mg/dL — ABNORMAL HIGH (ref 70–99)
Glucose-Capillary: 183 mg/dL — ABNORMAL HIGH (ref 70–99)
Glucose-Capillary: 159 mg/dL — ABNORMAL HIGH (ref 70–99)
Glucose-Capillary: 163 mg/dL — ABNORMAL HIGH (ref 70–99)

## 2020-12-25 LAB — HEPARIN LEVEL (UNFRACTIONATED)
Heparin Unfractionated: 0.47 IU/mL (ref 0.30–0.70)
Heparin Unfractionated: 0.65 IU/mL (ref 0.30–0.70)

## 2020-12-25 MED ORDER — FENTANYL CITRATE (PF) 100 MCG/2ML IJ SOLN
INTRAMUSCULAR | Status: AC
  Administered 2020-12-25: 200 ug via INTRAVENOUS
  Filled 2020-12-25: qty 4

## 2020-12-25 MED ORDER — LEVALBUTEROL HCL 0.63 MG/3ML IN NEBU
0.6300 mg | INHALATION_SOLUTION | Freq: Four times a day (QID) | RESPIRATORY_TRACT | Status: DC
  Administered 2020-12-25 – 2020-12-29 (×14): 0.63 mg via RESPIRATORY_TRACT
  Filled 2020-12-25 (×16): qty 3

## 2020-12-25 MED ORDER — IPRATROPIUM-ALBUTEROL 0.5-2.5 (3) MG/3ML IN SOLN: 3.0000 mL | Freq: Four times a day (QID) | RESPIRATORY_TRACT | Status: DC

## 2020-12-25 MED ORDER — FENTANYL 2500MCG IN NS 250ML (10MCG/ML) PREMIX INFUSION
0.0000 ug/h | INTRAVENOUS | Status: DC
  Administered 2020-12-26 – 2020-12-27 (×4): 200 ug/h via INTRAVENOUS
  Administered 2020-12-28: 175 ug/h via INTRAVENOUS
  Administered 2020-12-28: 200 ug/h via INTRAVENOUS
  Administered 2020-12-30: 50 ug/h via INTRAVENOUS
  Filled 2020-12-25 (×7): qty 250

## 2020-12-25 MED ORDER — FENTANYL CITRATE (PF) 100 MCG/2ML IJ SOLN: 200.0000 ug | Freq: Once | INTRAMUSCULAR | Status: AC

## 2020-12-25 MED ORDER — MIDAZOLAM BOLUS VIA INFUSION
0.0000 mg | INTRAVENOUS | Status: DC | PRN
  Administered 2020-12-26: 2 mg via INTRAVENOUS
  Filled 2020-12-25: qty 5

## 2020-12-25 MED ORDER — FENTANYL 2500MCG IN NS 250ML (10MCG/ML) PREMIX INFUSION
INTRAVENOUS | Status: AC
  Administered 2020-12-25: 100 ug/h via INTRAVENOUS
  Filled 2020-12-25: qty 250

## 2020-12-25 MED ORDER — HEPARIN BOLUS VIA INFUSION
3000.0000 [IU] | INTRAVENOUS | Status: AC
  Administered 2020-12-25: 3000 [IU] via INTRAVENOUS
  Filled 2020-12-25: qty 3000

## 2020-12-25 MED ORDER — ROCURONIUM BROMIDE 50 MG/5ML IV SOLN: 50.0000 mg | Freq: Once | INTRAVENOUS | Status: AC

## 2020-12-25 MED ORDER — METOPROLOL TARTRATE 25 MG PO TABS: 12.5000 mg | ORAL_TABLET | Freq: Two times a day (BID) | ORAL | Status: DC

## 2020-12-25 MED ORDER — MIDAZOLAM HCL 2 MG/2ML IJ SOLN: 2.0000 mg | INTRAMUSCULAR | Status: DC | PRN

## 2020-12-25 MED ORDER — MIDAZOLAM 50MG/50ML (1MG/ML) PREMIX INFUSION
0.0000 mg/h | INTRAVENOUS | Status: DC
  Administered 2020-12-25: 2 mg/h via INTRAVENOUS
  Administered 2020-12-26: 6 mg/h via INTRAVENOUS
  Administered 2020-12-26 – 2020-12-27 (×3): 7 mg/h via INTRAVENOUS
  Administered 2020-12-27: 6 mg/h via INTRAVENOUS
  Administered 2020-12-28: 5 mg/h via INTRAVENOUS
  Administered 2020-12-28: 6 mg/h via INTRAVENOUS
  Administered 2020-12-28: 5 mg/h via INTRAVENOUS
  Filled 2020-12-25 (×9): qty 50

## 2020-12-25 MED ORDER — NOREPINEPHRINE 4 MG/250ML-% IV SOLN
INTRAVENOUS | Status: AC
  Administered 2020-12-25: 4 mg
  Filled 2020-12-25: qty 250

## 2020-12-25 MED ORDER — ROCURONIUM BROMIDE 50 MG/5ML IV SOLN
INTRAVENOUS | Status: AC
  Administered 2020-12-25: 50 mg via INTRAVENOUS
  Filled 2020-12-25: qty 1

## 2020-12-25 MED ORDER — SODIUM CHLORIDE 0.45 % IV SOLN
Freq: Once | INTRAVENOUS | Status: AC
  Filled 2020-12-25: qty 75

## 2020-12-25 NOTE — Progress Notes (Signed)
[facility] Daily Progress Note  12/24/20:             1.  Contrast injection right heart             2.  Thrombolysis with 6 mg tPA to the pulmonary arteries             3.  Mechanical thrombectomy right lower lobe, right middle lobe, right upper lobe pulmonary arteries with the penumbra CAT 8 catheter as well as mechanical thrombectomy to the left lower and left upper lobe pulmonary arteries with the penumbra CAT 8 catheter             4.  Selective catheter placement right lower lobe, right middle lobe, and right upper lobe pulmonary artery             5.  Selective catheter placement left lower lobe and left after left heart  Subjective: Patient with aggression and agitation yesterday afternoon into evening requiring transfer to the ICU for Precedex.  Patient is resting comfortably this AM.  Objective: Vitals:   12/25/20 0700 12/25/20 0800 12/25/20 0900 12/25/20 1000  BP: (!) 82/68 130/60 (!) 87/75 (!) 86/67  Pulse:  (!) 57    Resp: (!) 21 16 (!) 9 (!) 22  Temp:      TempSrc:      SpO2:  91%    Weight:      Height:        Intake/Output Summary (Last 24 hours) at 12/25/2020 1051 Last data filed at 12/25/2020 0900 Gross per 24 hour  Intake 0 ml  Output 850 ml  Net -850 ml   Physical Exam: Sedated. On 2 L. NAD CV: Tachycardic Pulmonary: CTA Bilaterally Abdomen: Soft, Nontender, Nondistended Right Groin:  PAD in place.  No swelling or drainage noted. Vascular: Warm distally to toes   Laboratory: CBC    Component Value Date/Time   WBC 6.0 12/25/2020 0808   HGB 13.9 12/25/2020 0808   HGB 13.4 10/02/2011 0842   HCT 42.4 12/25/2020 0808   HCT 40.8 10/02/2011 0842   PLT 178 12/25/2020 0808   PLT 262 10/02/2011 0842   BMET    Component Value Date/Time   NA 140 12/25/2020 0808   NA 146 (H) 10/03/2011 0533   K 5.2 (H) 12/25/2020 0808   K 3.8 10/03/2011 0533   CL 109 12/25/2020 0808   CL 111 (H) 10/03/2011 0533   CO2 20 (L) 12/25/2020 0808    CO2 24 10/03/2011 0533   GLUCOSE 178 (H) 12/25/2020 0808   GLUCOSE 107 (H) 10/03/2011 0533   BUN 21 12/25/2020 0808   BUN 8 10/03/2011 0533   CREATININE 1.49 (H) 12/25/2020 0808   CREATININE 1.11 10/03/2011 0533   CALCIUM 9.1 12/25/2020 0808   CALCIUM 8.7 10/03/2011 0533   GFRNONAA 52 (L) 12/25/2020 0808   GFRNONAA >60 10/03/2011 0533   GFRAA >60 09/02/2019 1534   GFRAA >60 10/03/2011 0533   Assessment/Planning: The patient is a 65 year old male with multiple medical issues including known drug and alcohol abuse who was admitted with moderate thrombectomy/thrombolysis - POD#1  1) patient was given Versed and fentanyl as per our usual protocol. 2) patient was calm and answering questions appropriately before procedure yesterday. 3) transferred to the ICU for agitation and aggression needing Precedex 4) currently sedated and breathing without issue this AM.  Currently on 2 L nasal cannula. 5) Will need to transition to p.o. anticoagulation when the patient is  more stable and able to tolerate p.o.  Discussed with Dr. Wallis Mart Pamela Intrieri PA-C 12/25/2020 10:51 AM
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
## 2020-12-25 NOTE — TOC Initial Note (Signed)
Transition of Care [facility]) - Initial/Assessment Note    Patient Details  Name: Jeremy [name] MRN: [id number] Date of Birth: [birth year]  Transition of Care [facility]) CM/SW Contact:    Marina Goodell Phone Number: [phone] 12/25/2020, 10:13 AM  Clinical Narrative:                  Patient presents to [facility] due to SOB and palpitations for week. Patient has hx of substance use and tobacco use.  Patient stated he is currently using tobacco and THC but has not used cocaine for two weeks.  CTA positive for acute moderate clot burden bilateral pulmonary emboli no saddle embolus.  Mild to moderate cardiomegaly. Patient underwent a thrombectomy on 12/24/2020. Since the procedure the patient has experienced episodes of agitation, combative and aggressive and was transferred to the ICU. CSW called patient's spouse and main contact [name],[name] [phone] (Work Phone), but was unavailable.  Expected Discharge Plan: Home w Home Health Services Barriers to Discharge: Continued Medical Work up   Patient Goals and CMS Choice     Choice offered to / list presented to : Spouse Eilleen Kempf (Spouse)   [phone] (Work Phone))  Expected Discharge Plan and Services Expected Discharge Plan: Home w Home Health Services In-house Referral: Clinical Social Work   Post Acute Care Choice: Home Health Living arrangements for the past 2 months: Single Family Home                                      Prior Living Arrangements/Services Living arrangements for the past 2 months: Single Family Home Lives with:: Spouse Eilleen Kempf (Spouse)   [phone] (Work Phone)) Patient language and need for interpreter reviewed:: Yes Do you feel safe going back to the place where you live?: Yes      Need for Family Participation in Patient Care: Yes (Comment) Care giver support system in place?: Yes (comment)   Criminal Activity/Legal Involvement Pertinent to Current Situation/Hospitalization:  No - Comment as needed  Activities of Daily Living Home Assistive Devices/Equipment: Cane (specify quad or straight) ADL Screening (condition at time of admission) Patient's cognitive ability adequate to safely complete daily activities?: Yes Is the patient deaf or have difficulty hearing?: No Does the patient have difficulty seeing, even when wearing glasses/contacts?: No Does the patient have difficulty concentrating, remembering, or making decisions?: No Patient able to express need for assistance with ADLs?: No Does the patient have difficulty dressing or bathing?: No Independently performs ADLs?: Yes (appropriate for developmental age) Does the patient have difficulty walking or climbing stairs?: No Weakness of Legs: None Weakness of Arms/Hands: None  Permission Sought/Granted Permission sought to share information with : Facility Medical sales representative    Share Information with NAME: Eilleen Kempf (Spouse)   [phone] (Work Phone)           Emotional Assessment Appearance:: Appears stated age Attitude/Demeanor/Rapport: Unable to Assess Affect (typically observed): Unable to Assess   Alcohol / Substance Use: Illicit Drugs, Tobacco Use (hx of cocaine, THC and tobacco use) Psych Involvement: No (comment)  Admission diagnosis:  Palpitations [R00.2] NSTEMI (non-ST elevated myocardial infarction) (HCC) [I21.4] Bilateral pulmonary embolism (HCC) [I26.99] Pulmonary emboli (HCC) [I26.99] Patient Active Problem List   Diagnosis Date Noted   Pulmonary emboli (HCC) 12/22/2020   History of cocaine use 11/26/2019   History of marijuana use 11/26/2019   History of illicit drug use 11/26/2019   Pharmacologic therapy 11/26/2019  Acute pain (now resolved) 11/26/2019   MRSA (methicillin resistant Staphylococcus aureus) infection 10/18/2019   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 ([address]), [place] - [address] [place] ([place]) [place] [address] Phone: [phone] Fax: [phone]  [facility] [address], [place] - [address] [place] [address] Phone: [phone] Fax: [phone]     Social Determinants of Health (SDOH) Interventions    Readmission Risk Interventions No flowsheet data found.

## 2020-12-25 NOTE — Progress Notes (Signed)
ANTICOAGULATION CONSULT NOTE  Pharmacy Consult for heparin Indication: chest pain/ACS  Patient Measurements: Heparin Dosing Weight: 101 kg  Labs: Recent Labs    12/22/20 1500 12/22/20 1721 12/22/20 2257 12/23/20 1521 12/24/20 0656 12/24/20 2232 12/25/20 0808 12/25/20 1406  HGB  --   --    < > 14.8 14.6  --  13.9  --   HCT  --   --    < > 43.4 41.9  --  42.4  --   PLT  --   --    < > 234 241  --  178  --   APTT 30  --   --   --   --   --   --   --   LABPROT 13.0  --   --   --   --   --   --   --   INR 1.0  --   --   --   --   --   --   --   HEPARINUNFRC  --   --    < >  --  0.25* 0.10* 0.47 0.65  CREATININE  --   --   --   --  1.25* 1.26* 1.49*  --   TROPONINIHS  --  76*  --   --   --   --   --   --    < > = values in this interval not displayed.     Estimated Creatinine Clearance: 66.2 mL/min (A) (by C-G formula based on SCr of 1.49 mg/dL (H)).   Medical History: Past Medical History:  Diagnosis Date   Coronary artery disease    Diabetes mellitus without complication (HCC)    Hypertension    S/P CABG x 1     Assessment: 65 year old male w/ h/o CAD (s/p CABG), HTN, IIDM, came with new onset of palpitations and SOB. Elevation in troponin concerning for NSTEMI, w/ further w/u revealing acute Bil PE. Pharmacy consulted for mgmt of heparin drip.  7/11: CTA positive for acute moderate clot burden bilateral pulmonary emboli no saddle embolus.  Mild to moderate cardiomegaly.  No signs of right heart strain.  Normal RV/LV ratio.  Goal of Therapy:  Heparin level 0.3-0.7 units/ml Monitor platelets by anticoagulation protocol: Yes  Date Time aPTT/HL Rate/Comment 07/12 2257     0.37  thera x1; 1350 un/hr 07/12 0653     0.35  Thera x2; 1350 un/hr 07/13 0656     0.25  Subthera; 1350 >  07/13 2232     0.10  Subtherapeutic 07/14 0808     0.47  Therapeutic x 1 0714   1406     0.65  Therapeutic x 2  Baseline Labs: aPTT - 30s INR - 1.0 Hgb - 15.4>14.6>13.9 Plts -  [phone]  Plan:  Heparin level is therapeutic x 2 Continue heparin infusion at 1550 units/hr Re-check HL tomorrow AM CBC daily while on heparin infusion  Tressie Ellis  12/25/2020 2:40 PM

## 2020-12-25 NOTE — Procedures (Signed)
Endotracheal Intubation: Patient required placement of an artificial airway secondary to Respiratory Failure  Consent: Emergent.   Hand washing performed prior to starting the procedure.   Medications administered for sedation prior to procedure:    Rocuronium 50 mg IV, Fentanyl 150 mcg IV.    A time out procedure was called and correct patient, name, & ID confirmed. Needed supplies and equipment were assembled and checked to include ETT, 10 ml syringe, Glidescope, Mac and Miller blades, suction, oxygen and bag mask valve, end tidal CO2 monitor.   Patient was positioned to align the mouth and pharynx to facilitate visualization of the glottis.   Heart rate, SpO2 and blood pressure was continuously monitored during the procedure. Pre-oxygenation was conducted prior to intubation and endotracheal tube was placed through the vocal cords into the trachea.     The artificial airway was placed under direct visualization via glidescope route using a 8.0 ETT on the first attempt.  ETT was secured at 25 cm mark.  Placement was confirmed by auscuitation of lungs with good breath sounds bilaterally and no stomach sounds.  Condensation was noted on endotracheal tube.   Pulse ox 98%.  CO2 detector in place with appropriate color change.   Complications: None .    Chest radiograph ordered and pending.   Comments: OGT placed via glidescope.   Ottie Glazier, M.D.  Pulmonary & [place]
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
## 2020-12-25 NOTE — Progress Notes (Signed)
1800: Patients BP dropped. Fentanyl paused. But patient woke up and tried to sit up. MD notified. Fentanyl resumed and Precedex titrated down.

## 2020-12-25 NOTE — Care Plan (Signed)
  Attempted to reach family to update on medical plan and current conditions.  Called Eilleen Kempf (spouse) and home phone number. Unable to reach anyone. Awaiting call back.    Vida Rigger, M.D.  Pulmonary & Critical Care Medicine  [facility]

## 2020-12-25 NOTE — Progress Notes (Signed)
ANTICOAGULATION CONSULT NOTE  Pharmacy Consult for heparin Indication: chest pain/ACS  Patient Measurements: Heparin Dosing Weight: 101 kg  Labs: Recent Labs    12/22/20 1421 12/22/20 1500 12/22/20 1721 12/22/20 2257 12/23/20 1521 12/24/20 0656 12/24/20 2232 12/25/20 0808  HGB 15.4  --   --    < > 14.8 14.6  --  13.9  HCT 44.1  --   --    < > 43.4 41.9  --  42.4  PLT 252  --   --    < > 234 241  --  178  APTT  --  30  --   --   --   --   --   --   LABPROT  --  13.0  --   --   --   --   --   --   INR  --  1.0  --   --   --   --   --   --   HEPARINUNFRC  --   --   --    < >  --  0.25* 0.10* 0.47  CREATININE 1.04  --   --   --   --  1.25* 1.26* 1.49*  TROPONINIHS 74*  --  76*  --   --   --   --   --    < > = values in this interval not displayed.     Estimated Creatinine Clearance: 66.2 mL/min (A) (by C-G formula based on SCr of 1.49 mg/dL (H)).   Medical History: Past Medical History:  Diagnosis Date   Coronary artery disease    Diabetes mellitus without complication (HCC)    Hypertension    S/P CABG x 1     Assessment: 65 year old male w/ h/o CAD (s/p CABG), HTN, IIDM, came with new onset of palpitations and SOB. Elevation in troponin concerning for NSTEMI, w/ further w/u revealing acute Bil PE. Pharmacy consulted for mgmt of heparin drip.  7/11: CTA positive for acute moderate clot burden bilateral pulmonary emboli no saddle embolus.  Mild to moderate cardiomegaly.  No signs of right heart strain.  Normal RV/LV ratio.  Goal of Therapy:  Heparin level 0.3-0.7 units/ml Monitor platelets by anticoagulation protocol: Yes  Date Time aPTT/HL Rate/Comment 07/12 2257     0.37  thera x1; 1350 un/hr 07/12 0653     0.35  Thera x2; 1350 un/hr 07/13 0656     0.25  Subthera; 1350 >  07/13 2232     0.10  Subtherapeutic 7/14 0808     0.47  Therapeutic x 1    Baseline Labs: aPTT - 30s INR - 1.0 Hgb - 15.4>14.6>13.9 Plts - [phone]  Plan:  Heparin level is  therapeutic x 1 Continue heparin infusion at 1550 units/hr Re-check HL in 6 hours CBC daily while on heparin infusion  Tressie Ellis  12/25/2020 9:28 AM

## 2020-12-25 NOTE — Progress Notes (Signed)
PROGRESS NOTE    Jeremy [name]  ZOX:[id number] DOB: 29-Sep-1955 DOA: 12/22/2020 PCP: Patient, No Pcp Per (Inactive)   Brief Narrative:   65 y.o. male with medical history significant of CAD status post CABG, HTN, IIDM, came with new onset of palpitations and shortness of breath.   His symptoms started 1 week ago, gradually getting worse.  Denies any chest pain, no fever or chills.  Symptoms got worse with activity, meantime he also developed mild cough with whitish phlegm.  Denies any recent travels, active tobacco use with 1 pack of cigarettes per week.  CT angiography of the thorax revealed acute pulmonary embolism with moderate clot burden.  Patient initially refused echocardiogram however now agreeable.  Bilateral lower extremity duplex negative for VTE.  Patient was started on heparin infusion and has remained hemodynamically stable.  However he remains visibly short of breath, tachypneic and tachycardic at rest.  As such I have requested consultation from vascular surgery who deemed him appropriate for pulmonary thrombectomy.  Procedure planned for 7/13.  Patient remains on heparin gtt.  7/14: Patient became very agitated after returning from pulmonary thrombectomy on 7/13.  Violent, combative, attempting to hit staff and leave AMA.  Security called and patient subsequently received multiple doses of IV and IM benzodiazepines and antipsychotics with no effect.  Patient remained combative and violent given requiring multiple security for staff safety.  Patient was emergently transferred to intensive care unit for Precedex gtt.  PCCM consulted.  Patient remains on the Precedex infusion and went away continues to have confusions and hallucinations.  Also new onset atrial fibrillation, likely source of pulmonary embolism.  Assessment & Plan:   Active Problems:   Pulmonary emboli (HCC)  Acute delirium Suspected drug withdrawal Patient is a known cocaine abuser Apparently with daily  use Current delirium may be a manifestation of drug withdrawal Patient is at high risk for cardiopulmonary arrest due to withdrawal symptoms Plan: Continue ICU for Precedex gtt. Close monitoring of vitals Patient high risk for intubation  Acute pulmonary embolism Moderate clot burden on CT, no right heart strain Echocardiogram initially refused, now agreeable Vascular ultrasound negative for VTE Vascular surgery on consult Status post pulmonary thrombectomy Plan: Patient remains in intensive care unit on heparin infusion.  Sedated on Precedex gtt..  Continue to monitor.  Once clinically improved will need to transition to p.o. Xarelto.  New onset atrial fibrillation with rapid reticular response Likely trigger for pulmonary emboli   Type 2 diabetes mellitus Sugars well controlled over interval Home metformin on hold Continue sliding-scale coverage Consider low-dose Lantus if glycemic control becomes challenging  Polysubstance abuse Tobacco abuse Patient cocaine positive, educated   Elevated troponins Likely secondary to supply demand ischemia TTE repeated, results pending  Coronary artery disease status post CABG Denies chest pain Continue telemetry monitoring   DVT prophylaxis: Heparin GTT Code Status: Full Family Communication: None today Disposition Plan: Status is: Inpatient  Remains inpatient appropriate because:Inpatient level of care appropriate due to severity of illness  Dispo: The patient is from: Home              Anticipated d/c is to: Home              Patient currently is not medically stable to d/c.   Difficult to place patient No  Acute pulmonary embolism and acute severe delirium     Level of care: ICU  Consultants:  Vascular surgery ICU  Procedures:  Pulmonary thrombectomy 7/13  Antimicrobials:  None  Subjective: Patient seen and examined.  Sedated on Precedex gtt.  Objective: Vitals:   12/25/20 1305 12/25/20 1337 12/25/20  1400 12/25/20 1500  BP: 91/76  95/78 (!) 82/62  Pulse:  (!) 119 (!) 119 (!) 120  Resp: (!) 23 18 (!) 31 (!) 28  Temp:      TempSrc:      SpO2:  100% 96% 98%  Weight:      Height:        Intake/Output Summary (Last 24 hours) at 12/25/2020 1646 Last data filed at 12/25/2020 1100 Gross per 24 hour  Intake 0 ml  Output 550 ml  Net -550 ml   Filed Weights   12/22/20 1353  Weight: 117.9 kg    Examination:  General exam: Sedated Respiratory system: Tachypneic.  Bilateral crackles.  2 L Cardiovascular system: Tachycardic, regular rate and rhythm, no murmurs Gastrointestinal system: Obese, nontender, nondistended, normal bowel sounds Central nervous system: Sedated, oriented x0 Extremities: Cannot assess Skin: Chronic ulcer on right ankle Psychiatry: Cannot assess    Data Reviewed: I have personally reviewed following labs and imaging studies  CBC: Recent Labs  Lab 12/22/20 1421 12/23/20 0653 12/23/20 1521 12/24/20 0656 12/25/20 0808  WBC 6.6 6.9 7.8 6.4 6.0  NEUTROABS  --   --   --   --  3.2  HGB 15.4 14.6 14.8 14.6 13.9  HCT 44.1 43.2 43.4 41.9 42.4  MCV 91.7 91.3 93.7 93.1 95.7  PLT 252 253 234 241 178   Basic Metabolic Panel: Recent Labs  Lab 12/22/20 1421 12/23/20 1521 12/24/20 0656 12/24/20 2232 12/25/20 0808  NA 139  --  140 140 140  K 4.1  --  4.2 4.0 5.2*  CL 111  --  108 111 109  CO2 21*  --  25 23 20*  GLUCOSE 136*  --  170* 136* 178*  BUN 13  --  18 20 21   CREATININE 1.04  --  1.25* 1.26* 1.49*  CALCIUM 9.2  --  9.7 9.1 9.1  MG  --  2.0  --  2.2  --   PHOS  --  3.7  --   --   --    GFR: Estimated Creatinine Clearance: 66.2 mL/min (A) (by C-G formula based on SCr of 1.49 mg/dL (H)). Liver Function Tests: No results for input(s): AST, ALT, ALKPHOS, BILITOT, PROT, ALBUMIN in the last 168 hours. No results for input(s): LIPASE, AMYLASE in the last 168 hours. No results for input(s): AMMONIA in the last 168 hours. Coagulation Profile: Recent  Labs  Lab 12/22/20 1500  INR 1.0   Cardiac Enzymes: No results for input(s): CKTOTAL, CKMB, CKMBINDEX, TROPONINI in the last 168 hours. BNP (last 3 results) No results for input(s): PROBNP in the last 8760 hours. HbA1C: Recent Labs    12/22/20 2257  HGBA1C 8.7*   CBG: Recent Labs  Lab 12/24/20 1639 12/24/20 2243 12/25/20 0716 12/25/20 1107 12/25/20 1523  GLUCAP 102* 127* 154* 183* 159*   Lipid Profile: No results for input(s): CHOL, HDL, LDLCALC, TRIG, CHOLHDL, LDLDIRECT in the last 72 hours. Thyroid Function Tests: Recent Labs    12/24/20 2232  TSH 2.007   Anemia Panel: No results for input(s): VITAMINB12, FOLATE, FERRITIN, TIBC, IRON, RETICCTPCT in the last 72 hours. Sepsis Labs: Recent Labs  Lab 12/22/20 1421  PROCALCITON <0.10    Recent Results (from the past 240 hour(s))  Resp Panel by RT-PCR (Flu A&B, Covid) Nasopharyngeal Swab     Status: None  Collection Time: 12/22/20  2:21 PM   Specimen: Nasopharyngeal Swab; Nasopharyngeal(NP) swabs in vial transport medium  Result Value Ref Range Status   SARS Coronavirus 2 by RT PCR NEGATIVE NEGATIVE Final    Comment: (NOTE) SARS-CoV-2 target nucleic acids are NOT DETECTED.  The SARS-CoV-2 RNA is generally detectable in upper respiratory specimens during the acute phase of infection. The lowest concentration of SARS-CoV-2 viral copies this assay can detect is 138 copies/mL. A negative result does not preclude SARS-Cov-2 infection and should not be used as the sole basis for treatment or other patient management decisions. A negative result may occur with  improper specimen collection/handling, submission of specimen other than nasopharyngeal swab, presence of viral mutation(s) within the areas targeted by this assay, and inadequate number of viral copies(<138 copies/mL). A negative result must be combined with clinical observations, patient history, and epidemiological information. The expected result is  Negative.  Fact Sheet for Patients:  BloggerCourse.com  Fact Sheet for Healthcare Providers:  SeriousBroker.it  This test is no t yet approved or cleared by the [place] FDA and  has been authorized for detection and/or diagnosis of SARS-CoV-2 by FDA under an Emergency Use Authorization (EUA). This EUA will remain  in effect (meaning this test can be used) for the duration of the COVID-19 declaration under Section 564(b)(1) of the Act, 21 U.S.C.section 360bbb-3(b)(1), unless the authorization is terminated  or revoked sooner.       Influenza A by PCR NEGATIVE NEGATIVE Final   Influenza B by PCR NEGATIVE NEGATIVE Final    Comment: (NOTE) The Xpert Xpress SARS-CoV-2/FLU/RSV plus assay is intended as an aid in the diagnosis of influenza from Nasopharyngeal swab specimens and should not be used as a sole basis for treatment. Nasal washings and aspirates are unacceptable for Xpert Xpress SARS-CoV-2/FLU/RSV testing.  Fact Sheet for Patients: BloggerCourse.com  Fact Sheet for Healthcare Providers: SeriousBroker.it  This test is not yet approved or cleared by the [place] FDA and has been authorized for detection and/or diagnosis of SARS-CoV-2 by FDA under an Emergency Use Authorization (EUA). This EUA will remain in effect (meaning this test can be used) for the duration of the COVID-19 declaration under Section 564(b)(1) of the Act, 21 U.S.C. section 360bbb-3(b)(1), unless the authorization is terminated or revoked.  Performed at [facility], [address] [place], [place] [address]   MRSA Next Gen by PCR, Nasal     Status: None   Collection Time: 12/24/20 11:02 PM   Specimen: Nasal Mucosa; Nasal Swab  Result Value Ref Range Status   MRSA by PCR Next Gen NOT DETECTED NOT DETECTED Final    Comment: (NOTE) The GeneXpert MRSA Assay (FDA approved for NASAL  specimens only), is one component of a comprehensive MRSA colonization surveillance program. It is not intended to diagnose MRSA infection nor to guide or monitor treatment for MRSA infections. Test performance is not FDA approved in patients less than 31 years old. Performed at [facility], [address] [place], [place] [address]          Radiology Studies: CT HEAD WO CONTRAST  Result Date: 12/25/2020 CLINICAL DATA:  Initial evaluation for altered mental status, agitation. Prior pulmonary thrombectomy. EXAM: CT HEAD WITHOUT CONTRAST TECHNIQUE: Contiguous axial images were obtained from the base of the skull through the vertex without intravenous contrast. COMPARISON:  None available. FINDINGS: Brain: Generalized age-related cerebral atrophy. Patchy and confluent hypodensity involving the periventricular and deep white matter both cerebral hemispheres, most  consistent with chronic small vessel ischemic disease, moderately advanced in nature. Few scattered superimposed remote lacunar infarcts present about the hemispheric cerebral white matter. Few small remote bilateral cerebellar infarcts noted. Approximate 1.5 cm hypodensity noted involving the anterior right caudate/internal capsule, somewhat age indeterminate, and could be chronic (series 3, image 16). No other evidence for acute large vessel territory infarct. No acute intracranial hemorrhage. No mass lesion or midline shift. No hydrocephalus or extra-axial fluid collection. Vascular: No hyperdense vessel. Calcified atherosclerosis present at the skull base. Skull: Scalp soft tissues within normal limits.  Calvarium intact. Sinuses/Orbits: Globes and orbital soft tissues demonstrate no acute finding. Paranasal sinuses are largely clear. No mastoid effusion. Other: None. IMPRESSION: 1. 1.5 cm hypodensity involving the anterior right caudate/internal capsule, indeterminate. While this finding may be chronic in nature, possible acute  to subacute ischemic change is difficult to exclude. If there is clinical concern for possible acute infarct at this location, this could be further assessed with dedicated MRI as clinically warranted. 2. No other acute intracranial abnormality. 3. Generalized age-related cerebral atrophy with moderate chronic small vessel ischemic disease, with a few additional scattered remote lacunar infarcts as above. Electronically Signed   By: Rise Mu M.D.   On: 12/25/2020 03:22   PERIPHERAL VASCULAR CATHETERIZATION  Result Date: 12/24/2020 See surgical note for result.       Scheduled Meds:  fentaNYL       rocuronium       Chlorhexidine Gluconate Cloth  6 each Topical Daily   fentaNYL (SUBLIMAZE) injection  200 mcg Intravenous Once   folic acid  1 mg Oral Daily   insulin aspart  0-15 Units Subcutaneous TID WC   insulin aspart  0-5 Units Subcutaneous QHS   levalbuterol  0.63 mg Nebulization Q6H   multivitamin with minerals  1 tablet Oral Daily   nicotine  21 mg Transdermal Daily   rocuronium  50 mg Intravenous Once   thiamine  100 mg Oral Daily   Or   thiamine  100 mg Intravenous Daily   Continuous Infusions:  dexmedetomidine (PRECEDEX) IV infusion 0.7 mcg/kg/hr (12/25/20 1219)   heparin 1,550 Units/hr (12/25/20 1451)     LOS: 3 days    Time spent: 25 minutes    Tresa Moore, MD [facility] Pager [phone]  If 7PM-7AM, please contact night-coverage 12/25/2020, 4:46 PM

## 2020-12-25 NOTE — Progress Notes (Addendum)
ANTICOAGULATION CONSULT NOTE  Pharmacy Consult for heparin Indication: chest pain/ACS  Allergies  Allergen Reactions   Amoxicillin Swelling   Keflex [Cephalexin] Rash    Patient Measurements: Height: 5\' 11"  (180.3 cm) Weight: 117.9 kg (260 lb) IBW/kg (Calculated) : 75.3 Heparin Dosing Weight: 101 kg  Vital Signs: Temp: 98.5 F (36.9 C) (07/13 1643) Temp Source: Oral (07/13 1413) BP: 75/55 (07/13 2239) Pulse Rate: 93 (07/13 2239)  Labs: Recent Labs    12/22/20 1421 12/22/20 1500 12/22/20 1721 12/22/20 2257 12/23/20 0653 12/23/20 1521 12/24/20 0656 12/24/20 2232  HGB 15.4  --   --   --  14.6 14.8 14.6  --   HCT 44.1  --   --   --  43.2 43.4 41.9  --   PLT 252  --   --   --  253 234 241  --   APTT  --  30  --   --   --   --   --   --   LABPROT  --  13.0  --   --   --   --   --   --   INR  --  1.0  --   --   --   --   --   --   HEPARINUNFRC  --   --   --    < > 0.35  --  0.25* 0.10*  CREATININE 1.04  --   --   --   --   --  1.25* 1.26*  TROPONINIHS 74*  --  76*  --   --   --   --   --    < > = values in this interval not displayed.     Estimated Creatinine Clearance: 78.3 mL/min (A) (by C-G formula based on SCr of 1.26 mg/dL (H)).   Medical History: Past Medical History:  Diagnosis Date   Coronary artery disease    Diabetes mellitus without complication (HCC)    Hypertension    S/P CABG x 1     Assessment: 65 year old male w/ h/o CAD (s/p CABG), HTN, IIDM, came with new onset of palpitations and SOB. Elevation in troponin concerning for NSTEMI, w/ further w/u revealing acute Bil PE. Pharmacy consulted for mgmt of heparin drip.  7/11: CTA positive for acute moderate clot burden bilateral pulmonary emboli no saddle embolus.  Mild to moderate cardiomegaly.  No signs of right heart strain.  Normal RV/LV ratio.  Goal of Therapy:  Heparin level 0.3-0.7 units/ml Monitor platelets by anticoagulation protocol: Yes  Date Time aPTT/HL Rate/Comment 07/11 2257      0.37  thera x1; 1350 un/hr 07/11 0653     0.35  Thera x2; 1350 un/hr 07/12 0656     0.25  Subthera; 1350 >  07/12 2232     0.10  Subtherapeutic     Baseline Labs: aPTT - 30s INR - 1.0 Hgb - 15.4>14.6 Plts - 252>253 Trop 74>76  Plan:  Contacted RN.  Per RN, pt lost IV access prior to her shift (stopped at 1816). New IV placed.  Pt currently OTF in CT.  Will restart heparin drip when pt returns to floor. Due to subtherapeutic level, will bolus 3000 units from infusion x1; then restart heparin infusion at previous rate  of 1550 units/hr. Recheck HL in 8hrs after infusion restarted.. CTM CBC daily while on heparin infusion  09/12, PharmD, [facility] 12/25/2020 12:31 AM

## 2020-12-25 NOTE — Progress Notes (Signed)
1600: Patient found sitting upright against the wall behind the bed naked. Patient pulled the Left AC peripheral line where Precedex and Heparin drip were infusing. Multiple staff at bedside assisted patient back to bed. MD notified.   Patient restarted back on Precedex and still agitated and yelling at staff and being aggressive. MD at bedside and decided to intubate the patient. Pre medication intubated meds given. Patient intubated by Dr. Karna Christmas.   Patient remained on the Precedex drip and started on fentanyl drip. Family notified of the incident. supervisor and unit asst manager notified as well.

## 2020-12-26 ENCOUNTER — Inpatient Hospital Stay: Payer: Medicaid Other

## 2020-12-26 ENCOUNTER — Other Ambulatory Visit: Payer: Self-pay

## 2020-12-26 ENCOUNTER — Inpatient Hospital Stay: Payer: Self-pay

## 2020-12-26 DIAGNOSIS — J9601 Acute respiratory failure with hypoxia: Secondary | ICD-10-CM

## 2020-12-26 DIAGNOSIS — I2699 Other pulmonary embolism without acute cor pulmonale: Secondary | ICD-10-CM | POA: Diagnosis not present

## 2020-12-26 DIAGNOSIS — I214 Non-ST elevation (NSTEMI) myocardial infarction: Secondary | ICD-10-CM

## 2020-12-26 LAB — SODIUM, URINE, RANDOM: Sodium, Ur: 91 mmol/L

## 2020-12-26 LAB — URINALYSIS, COMPLETE (UACMP) WITH MICROSCOPIC
Bacteria, UA: NONE SEEN
Bilirubin Urine: NEGATIVE
Glucose, UA: NEGATIVE mg/dL
Hgb urine dipstick: NEGATIVE
Ketones, ur: NEGATIVE mg/dL
Nitrite: NEGATIVE
Protein, ur: NEGATIVE mg/dL
Specific Gravity, Urine: 1.017 (ref 1.005–1.030)
Squamous Epithelial / HPF: NONE SEEN (ref 0–5)
pH: 5 (ref 5.0–8.0)

## 2020-12-26 LAB — GLUCOSE, CAPILLARY
Glucose-Capillary: 133 mg/dL — ABNORMAL HIGH (ref 70–99)
Glucose-Capillary: 147 mg/dL — ABNORMAL HIGH (ref 70–99)
Glucose-Capillary: 134 mg/dL — ABNORMAL HIGH (ref 70–99)
Glucose-Capillary: 138 mg/dL — ABNORMAL HIGH (ref 70–99)
Glucose-Capillary: 139 mg/dL — ABNORMAL HIGH (ref 70–99)

## 2020-12-26 LAB — BASIC METABOLIC PANEL
Anion gap: 10 (ref 5–15)
Anion gap: 8 (ref 5–15)
BUN: 30 mg/dL — ABNORMAL HIGH (ref 8–23)
BUN: 28 mg/dL — ABNORMAL HIGH (ref 8–23)
CO2: 20 mmol/L — ABNORMAL LOW (ref 22–32)
CO2: 22 mmol/L (ref 22–32)
Calcium: 8.4 mg/dL — ABNORMAL LOW (ref 8.9–10.3)
Calcium: 8 mg/dL — ABNORMAL LOW (ref 8.9–10.3)
Chloride: 111 mmol/L (ref 98–111)
Chloride: 107 mmol/L (ref 98–111)
Creatinine, Ser: 2.48 mg/dL — ABNORMAL HIGH (ref 0.61–1.24)
Creatinine, Ser: 2.02 mg/dL — ABNORMAL HIGH (ref 0.61–1.24)
GFR, Estimated: 28 mL/min — ABNORMAL LOW (ref >60)
GFR, Estimated: 36 mL/min — ABNORMAL LOW (ref >60)
Glucose, Bld: 139 mg/dL — ABNORMAL HIGH (ref 70–99)
Glucose, Bld: 284 mg/dL — ABNORMAL HIGH (ref 70–99)
Potassium: 5.6 mmol/L — ABNORMAL HIGH (ref 3.5–5.1)
Potassium: 4 mmol/L (ref 3.5–5.1)
Sodium: 141 mmol/L (ref 135–145)
Sodium: 137 mmol/L (ref 135–145)

## 2020-12-26 LAB — CBC
HCT: 39.7 % (ref 39.0–52.0)
Hemoglobin: 13.1 g/dL (ref 13.0–17.0)
MCH: 31.9 pg (ref 26.0–34.0)
MCHC: 33 g/dL (ref 30.0–36.0)
MCV: 96.6 fL (ref 80.0–100.0)
Platelets: 210 10*3/uL (ref 150–400)
RBC: 4.11 MIL/uL — ABNORMAL LOW (ref 4.22–5.81)
RDW: 13.7 % (ref 11.5–15.5)
WBC: 10.8 10*3/uL — ABNORMAL HIGH (ref 4.0–10.5)
nRBC: 0.2 % (ref 0.0–0.2)

## 2020-12-26 LAB — MAGNESIUM: Magnesium: 2 mg/dL (ref 1.7–2.4)

## 2020-12-26 LAB — CREATININE, URINE, RANDOM: Creatinine, Urine: 158 mg/dL

## 2020-12-26 LAB — HEPARIN LEVEL (UNFRACTIONATED)
Heparin Unfractionated: <0.1 IU/mL — ABNORMAL LOW (ref 0.30–0.70)
Heparin Unfractionated: 0.76 IU/mL — ABNORMAL HIGH (ref 0.30–0.70)
Heparin Unfractionated: 0.59 [IU]/mL (ref 0.30–0.70)
Heparin Unfractionated: 0.51 IU/mL (ref 0.30–0.70)

## 2020-12-26 LAB — CKMB (ARMC ONLY): CK, MB: 3 ng/mL (ref 0.5–5.0)

## 2020-12-26 LAB — PHOSPHORUS: Phosphorus: 4.9 mg/dL — ABNORMAL HIGH (ref 2.5–4.6)

## 2020-12-26 MED ORDER — NOREPINEPHRINE 4 MG/250ML-% IV SOLN
2.0000 ug/min | INTRAVENOUS | Status: DC
  Administered 2020-12-26: 5 ug/min via INTRAVENOUS

## 2020-12-26 MED ORDER — INSULIN ASPART 100 UNIT/ML IJ SOLN
0.0000 [IU] | INTRAMUSCULAR | Status: DC
  Administered 2020-12-26 (×3): 2 [IU] via SUBCUTANEOUS
  Administered 2020-12-27 (×3): 3 [IU] via SUBCUTANEOUS
  Administered 2020-12-27: 2 [IU] via SUBCUTANEOUS
  Administered 2020-12-27: 3 [IU] via SUBCUTANEOUS
  Administered 2020-12-27: 2 [IU] via SUBCUTANEOUS
  Administered 2020-12-28: 5 [IU] via SUBCUTANEOUS
  Administered 2020-12-28 (×4): 3 [IU] via SUBCUTANEOUS
  Administered 2020-12-29: 5 [IU] via SUBCUTANEOUS
  Administered 2020-12-29 (×2): 3 [IU] via SUBCUTANEOUS
  Administered 2020-12-29: 5 [IU] via SUBCUTANEOUS
  Administered 2020-12-29: 3 [IU] via SUBCUTANEOUS
  Filled 2020-12-26 (×19): qty 1

## 2020-12-26 MED ORDER — NOREPINEPHRINE 4 MG/250ML-% IV SOLN
INTRAVENOUS | Status: AC
  Filled 2020-12-26: qty 250

## 2020-12-26 MED ORDER — HYDROCODONE-ACETAMINOPHEN 5-325 MG PO TABS: 1.0000 | ORAL_TABLET | ORAL | Status: DC | PRN

## 2020-12-26 MED ORDER — ACETAMINOPHEN 325 MG PO TABS
650.0000 mg | ORAL_TABLET | Freq: Four times a day (QID) | ORAL | Status: DC | PRN
  Administered 2021-01-02: 650 mg
  Filled 2020-12-26: qty 2

## 2020-12-26 MED ORDER — SODIUM CHLORIDE 0.9% FLUSH
10.0000 mL | Freq: Two times a day (BID) | INTRAVENOUS | Status: DC
  Administered 2020-12-26 – 2021-01-03 (×16): 10 mL

## 2020-12-26 MED ORDER — CHLORHEXIDINE GLUCONATE 0.12% ORAL RINSE (MEDLINE KIT)
15.0000 mL | Freq: Two times a day (BID) | OROMUCOSAL | Status: DC
  Administered 2020-12-26 – 2021-01-02 (×14): 15 mL via OROMUCOSAL

## 2020-12-26 MED ORDER — THIAMINE HCL 100 MG/ML IJ SOLN
Freq: Three times a day (TID) | INTRAVENOUS | Status: AC
  Filled 2020-12-26 (×9): qty 50

## 2020-12-26 MED ORDER — MIDAZOLAM BOLUS VIA INFUSION
4.0000 mg | INTRAVENOUS | Status: AC
  Administered 2020-12-26: 4 mg via INTRAVENOUS
  Filled 2020-12-26: qty 4

## 2020-12-26 MED ORDER — DOCUSATE SODIUM 50 MG/5ML PO LIQD: 100.0000 mg | Freq: Two times a day (BID) | ORAL | Status: DC | PRN

## 2020-12-26 MED ORDER — PANTOPRAZOLE SODIUM 40 MG IV SOLR
40.0000 mg | Freq: Every day | INTRAVENOUS | Status: DC
  Administered 2020-12-26 – 2020-12-29 (×4): 40 mg via INTRAVENOUS
  Filled 2020-12-26 (×4): qty 40

## 2020-12-26 MED ORDER — JUVEN PO PACK
1.0000 | PACK | Freq: Two times a day (BID) | ORAL | Status: DC
  Administered 2020-12-27 – 2020-12-30 (×7): 1

## 2020-12-26 MED ORDER — ADULT MULTIVITAMIN W/MINERALS CH
1.0000 | ORAL_TABLET | Freq: Every day | ORAL | Status: DC
  Administered 2020-12-27 – 2021-01-03 (×7): 1
  Filled 2020-12-26 (×7): qty 1

## 2020-12-26 MED ORDER — SODIUM CHLORIDE 0.9% FLUSH: 10.0000 mL | INTRAVENOUS | Status: DC | PRN

## 2020-12-26 MED ORDER — STERILE WATER FOR INJECTION IV SOLN
Freq: Once | INTRAVENOUS | Status: AC
  Filled 2020-12-26: qty 150

## 2020-12-26 MED ORDER — POLYETHYLENE GLYCOL 3350 17 G PO PACK: 17.0000 g | PACK | Freq: Every day | ORAL | Status: DC | PRN

## 2020-12-26 MED ORDER — ACETAMINOPHEN 650 MG RE SUPP: 650.0000 mg | Freq: Four times a day (QID) | RECTAL | Status: DC | PRN

## 2020-12-26 MED ORDER — FOLIC ACID 1 MG PO TABS
1.0000 mg | ORAL_TABLET | Freq: Every day | ORAL | Status: DC
  Administered 2020-12-27 – 2020-12-30 (×4): 1 mg
  Filled 2020-12-26 (×4): qty 1

## 2020-12-26 MED ORDER — ONDANSETRON HCL 4 MG PO TABS: 4.0000 mg | ORAL_TABLET | Freq: Four times a day (QID) | ORAL | Status: DC | PRN

## 2020-12-26 MED ORDER — PROSOURCE TF PO LIQD
90.0000 mL | Freq: Three times a day (TID) | ORAL | Status: DC
  Administered 2020-12-26 – 2020-12-30 (×12): 90 mL
  Filled 2020-12-26 (×17): qty 90

## 2020-12-26 MED ORDER — VECURONIUM BROMIDE 10 MG IV SOLR
10.0000 mg | INTRAVENOUS | Status: AC
  Administered 2020-12-26: 10 mg via INTRAVENOUS
  Filled 2020-12-26: qty 10

## 2020-12-26 MED ORDER — DOCUSATE SODIUM 50 MG/5ML PO LIQD
100.0000 mg | Freq: Two times a day (BID) | ORAL | Status: DC | PRN
  Filled 2020-12-26: qty 10

## 2020-12-26 MED ORDER — ONDANSETRON HCL 4 MG/2ML IJ SOLN: 4.0000 mg | Freq: Four times a day (QID) | INTRAMUSCULAR | Status: DC | PRN

## 2020-12-26 MED ORDER — FREE WATER
30.0000 mL | Status: DC
  Administered 2020-12-26 – 2020-12-30 (×24): 30 mL

## 2020-12-26 MED ORDER — ORAL CARE MOUTH RINSE
15.0000 mL | OROMUCOSAL | Status: DC
  Administered 2020-12-26 – 2020-12-30 (×39): 15 mL via OROMUCOSAL

## 2020-12-26 MED ORDER — SENNOSIDES-DOCUSATE SODIUM 8.6-50 MG PO TABS
1.0000 | ORAL_TABLET | Freq: Every evening | ORAL | Status: DC | PRN
  Administered 2020-12-30: 1
  Filled 2020-12-26: qty 1

## 2020-12-26 MED ORDER — SODIUM ZIRCONIUM CYCLOSILICATE 5 G PO PACK
10.0000 g | PACK | Freq: Once | ORAL | Status: AC
  Administered 2020-12-26: 10 g
  Filled 2020-12-26: qty 2

## 2020-12-26 MED ORDER — NOREPINEPHRINE 4 MG/250ML-% IV SOLN
0.0000 ug/min | INTRAVENOUS | Status: DC
  Administered 2020-12-26: 5 ug/min via INTRAVENOUS
  Filled 2020-12-26: qty 250

## 2020-12-26 MED ORDER — VITAL AF 1.2 CAL PO LIQD
1000.0000 mL | ORAL | Status: DC
  Administered 2020-12-26 – 2020-12-28 (×3): 1000 mL

## 2020-12-26 MED ORDER — FENTANYL BOLUS VIA INFUSION
50.0000 ug | INTRAVENOUS | Status: AC
  Administered 2020-12-26: 50 ug via INTRAVENOUS
  Filled 2020-12-26: qty 50

## 2020-12-26 NOTE — Progress Notes (Addendum)
Initial Nutrition Assessment  DOCUMENTATION CODES:   Obesity unspecified  INTERVENTION:   Vital 1.2 @50ml /hr- Initiate at 65ml/hr and increase by 26ml/hr q 8 hours until goal rate is reached.   Pro-Source 44ml TID via tube, provides 40kcal and 11g of protein per serving   Free water flushes 45ml q4 hours to maintain tube patency   Regimen provides 1680kcal/day, 156g/day protein and 117ml/day free water   Pt at high refeed risk; recommend monitor potassium, magnesium and phosphorus labs daily until stable  Juven Fruit Punch BID, each serving provides 95kcal and 2.5g of protein (amino acids glutamine and arginine)  NUTRITION DIAGNOSIS:   Inadequate oral intake related to inability to eat (pt sedated and ventilated) as evidenced by NPO status.  GOAL:   Provide needs based on ASPEN/SCCM guidelines  MONITOR:   Vent status, Labs, Weight trends, Skin, I & O's, TF tolerance  REASON FOR ASSESSMENT:   Ventilator    ASSESSMENT:   65 y/o male with h/o CAD, CABG x 1, NSTEMI, medical noncompaince, HTN, DM and substance abuse who is admitted with b/l PE, new Afib and agitation/delirium requiring intubation.  Pt s/p mechanical thrombectomy 7/13  Pt sedated and ventilated. OGT in place. Plan is to start tube feeds today. Pt is likely at refeed risk r/t substance abuse. Pt with hyperkalemia today; plan is for lokelma. Per chart, pt appears weight stable at baseline.   Medications reviewed and include: folic acid, insulin, MVI, nicotine, protonix, thiamine, fentanyl, heparin, levophed, Na bicarbonate   Labs reviewed: K 5.6(H), BUN 30(H), creat 2.48(H) Wbc- 10.8(H) cbgs- 133, 147 x 24 hrs AIC 8.7(H)- 7/11  NUTRITION - FOCUSED PHYSICAL EXAM:  Flowsheet Row Most Recent Value  Orbital Region No depletion  Upper Arm Region Mild depletion  Thoracic and Lumbar Region No depletion  Buccal Region No depletion  Temple Region No depletion  Clavicle Bone Region Mild depletion  Clavicle  and Acromion Bone Region Mild depletion  Scapular Bone Region No depletion  Dorsal Hand No depletion  Patellar Region Moderate depletion  Anterior Thigh Region Mild depletion  Posterior Calf Region Mild depletion  Edema (RD Assessment) None  Hair Reviewed  Eyes Reviewed  Mouth Reviewed  Skin Reviewed  Nails Reviewed   Diet Order:   Diet Order             Diet heart healthy/carb modified Room service appropriate? Yes; Fluid consistency: Thin  Diet effective now                  EDUCATION NEEDS:   No education needs have been identified at this time  Skin:  Skin Assessment: Reviewed RN Assessment (wound R ankle)  Last BM:  pta  Height:   Ht Readings from Last 1 Encounters:  12/25/20 5\' 11"  (1.803 m)    Weight:   Wt Readings from Last 1 Encounters:  12/22/20 117.9 kg    Ideal Body Weight:  78.18 kg  BMI:  Body mass index is 36.26 kg/m.  Estimated Nutritional Needs:   Kcal:  1297-1651kcal/day  Protein:  >156g/day  Fluid:  2.3-2.7L/day  MS, RD, LDN Please refer to [facility] for RD and/or RD on-call/weekend/after hours pager

## 2020-12-26 NOTE — Progress Notes (Signed)
Pt taken to CT w/o issue. Pt returned and placed on doc settings. Will continue as ordered.

## 2020-12-26 NOTE — TOC Progression Note (Signed)
Transition of Care [facility]) - Progression Note    Patient Details  Name: Jeremy [name] MRN: [id number] Date of Birth: [birth year]  [facility]) CM/SW Contact  Marina Goodell Phone Number: [phone] 12/26/2020, 1:52 PM  Clinical Narrative:     Patient remains sedated/intubated, unresponsive and not opening eyes to command. Attending spoke with patient's main contact [name],[name] (Spouse) [phone] (Work Phone).  Patient remains FULL CODE.   Expected Discharge Plan: Home w Home Health Services Barriers to Discharge: Continued Medical Work up  Expected Discharge Plan and Services Expected Discharge Plan: Home w Home Health Services In-house Referral: Clinical Social Work   Post Acute Care Choice: Home Health Living arrangements for the past 2 months: Single Family Home                                       Social Determinants of Health (SDOH) Interventions    Readmission Risk Interventions No flowsheet data found.

## 2020-12-26 NOTE — Progress Notes (Signed)
GOALS OF CARE DISCUSSION  The Clinical status was relayed to family in detail. Wife Jeremy [name]  Updated and notified of patients medical condition.    Patient remains unresponsive and will not open eyes to command.   Patient is having a weak cough and struggling to remove secretions.   Patient with increased WOB and using accessory muscles to breathe Explained to family course of therapy and the modalities  +cocaine +resp failure +PE +kidney failure    Patient with Progressive multiorgan failure with a very high probablity of a very minimal chance of meaningful recovery despite all aggressive and optimal medical therapy.  PATIENT REMAINS FULL CODE  Family understands the situation.   Family are satisfied with Plan of action and management. All questions answered  Additional CC time 35 mins   Jeremy [name] [name], M.D.  [facility]  Medical Director [facility] Medical Director [facility] Cardio-Pulmonary Department

## 2020-12-26 NOTE — Progress Notes (Signed)
ANTICOAGULATION CONSULT NOTE  Pharmacy Consult for heparin Indication: chest pain/ACS  Patient Measurements: Heparin Dosing Weight: 101 kg  Labs: Recent Labs    12/24/20 0656 12/24/20 2232 12/25/20 0808 12/25/20 1406 12/25/20 1807 12/26/20 0832  HGB 14.6  --  13.9  --   --  13.1  HCT 41.9  --  42.4  --   --  39.7  PLT 241  --  178  --   --  210  HEPARINUNFRC 0.25*   < > 0.47 0.65  --  0.76*  CREATININE 1.25*   < > 1.49*  --  1.65* 2.48*   < > = values in this interval not displayed.     Estimated Creatinine Clearance: 39.8 mL/min (A) (by C-G formula based on SCr of 2.48 mg/dL (H)).   Medical History: Past Medical History:  Diagnosis Date   Coronary artery disease    Diabetes mellitus without complication (HCC)    Hypertension    S/P CABG x 1     Assessment: 65 year old male w/ h/o CAD (s/p CABG), HTN, IIDM, came with new onset of palpitations and SOB. Elevation in troponin concerning for NSTEMI, w/ further w/u revealing acute Bil PE. Pharmacy consulted for mgmt of heparin drip.  7/11: CTA positive for acute moderate clot burden bilateral pulmonary emboli no saddle embolus.  Mild to moderate cardiomegaly.  No signs of right heart strain.  Normal RV/LV ratio.  Goal of Therapy:  Heparin level 0.3-0.7 units/ml Monitor platelets by anticoagulation protocol: Yes  Date Time aPTT/HL Rate/Comment 07/12 2257     0.37  Thera x1 07/12 0653     0.35  Thera x2 07/13 0656     0.25  Subthera 07/13 2232     0.10  Subthera 07/14 0808     0.47  Thera x 1 0714   1406     0.65  Thera x 2 0715 0832     0.76   Suprathera  Baseline Labs: aPTT - 30s INR - 1.0 Hgb - 15.4>14.6>13.9>13.1 Plts - 252>253>178>210  Plan:  Heparin level is supratherapeutic Decrease heparin infusion to 1450 units/hr Re-check HL 6 hours after rate change CBC daily while on heparin infusion  Tressie Ellis  12/26/2020 9:58 AM

## 2020-12-26 NOTE — Progress Notes (Signed)
Peripherally Inserted Central Catheter Placement  The IV Nurse has discussed with the patient and/or persons authorized to consent for the patient, the purpose of this procedure and the potential benefits and risks involved with this procedure.  The benefits include less needle sticks, lab draws from the catheter, and the patient may be discharged home with the catheter. Risks include, but not limited to, infection, bleeding, blood clot (thrombus formation), and puncture of an artery; nerve damage and irregular heartbeat and possibility to perform a PICC exchange if needed/ordered by physician.  Alternatives to this procedure were also discussed.  Bard Power PICC patient education guide, fact sheet on infection prevention and patient information card has been provided to patient /or left at bedside.   Primary RN obtained consent  via telephone with family.  PICC Placement Documentation  PICC Triple Lumen 12/26/20 PICC Right Basilic 41 cm 0 cm (Active)  Indication for Insertion or Continuance of Line Limited venous access - need for IV therapy >5 days (PICC only) 12/26/20 1200  Exposed Catheter (cm) 0 cm 12/26/20 1200  Site Assessment Clean;Dry;Intact 12/26/20 1200  Lumen #1 Status Flushed;Saline locked;Blood return noted 12/26/20 1200  Lumen #2 Status Flushed;Saline locked;Blood return noted 12/26/20 1200  Lumen #3 Status Flushed;Saline locked;Blood return noted 12/26/20 1200  Dressing Type Transparent;Securing device 12/26/20 1200  Dressing Status Clean;Dry;Intact 12/26/20 1200  Antimicrobial disc in place? Yes 12/26/20 1200  Safety Lock Not Applicable 12/26/20 1200  Line Care Connections checked and tightened 12/26/20 1200  Dressing Intervention New dressing 12/26/20 1200  Dressing Change Due 01/02/21 12/26/20 1200       Franne Grip Renee 12/26/2020, 12:48 PM

## 2020-12-26 NOTE — Progress Notes (Signed)
NAME:  Jeremy [name], MRN:  [id number], DOB:  10/25/1955, LOS: 4 ADMISSION DATE:  12/22/2020, CONSULTATION DATE:  12/24/2020 REFERRING MD: Eugenie Norrie  MD, CHIEF COMPLAINT:  Agitation   History of present illness   65 year old male with significant past medical history of CAD s/p CABG, HTN, llDM, polysubstance abuse, and MRSA infection who presented to the ED on 12/23/2018 with chief complaints of shortness of breath and palpitation x1 week  ED Course: On arrival to the ED, he was afebrile with blood pressure (!) 131/124m Hg and pulse rate (!) 128 beats/min, RR (!) 26. Sats 96% on RA. There were no focal neurological deficits; he was alert and oriented x4, and he did not demonstrate any memory deficits.  Labs/Diagnostics BMP shows no significant electrolyte or metabolic derangements.  CBC shows no leukocytosis or acute anemia.  COVID and influenza test is negative EKG: Sinus tachycardia with a ventricular of 127, normal axis, unremarkable intervals with some T wave inversions nonspecific changes in lateral leads and some nonspecific changes in anterior leads without other clear evidence of acute ischemia. Troponin: 74 CXR: Mild congestive heart failure pulm edema without focal consolidation, effusion or pneumothorax. CTA Chest: acute moderate clot burden bilateral pulmonary emboli no saddle embolus.  Mild to moderate cardiomegaly.  No signs of right heart strain.  Normal RV/LV ratio Other Labs D-Dimer elevated at 2.9. Given positive finding of bilateral pulmonary embolus Szymon as above and elevated troponin concerning for NSTEMI, patient was started on heparin drip and admitted under hospitalist service for further management  Hospital Course: During the course of his admission patient remained visibly short of breath, tachypneic and tachycardic at rest.  Vascular surgeon was consulted who deemed him appropriate for pulmonary thrombectomy.  Patient underwent pulmonary thrombectomy on 12/24/2020.   Per nursing staff, following the thrombectomy procedure patient became agitated and irritable trying to leave AMA.  Security was called and patient subsequently received 0.5 mg IV Ativan, 5 mg IM Haldol, 5 mg IM Valium, 2 mg Versed, Zyprexa 10 mg IM with no effect.  He remained combative, violently agitated requiring multiple security at the bedside.  Patient was transferred to ICU for Precedex drip and PCCM consulted.  Past Medical History  CAD s/p CABG, HTN, llDM, polysubstance abuse, and MRSA infection   Significant Hospital Events   7/11: Admitted to PCU with bilateral pulmonary embolism 7/13: S/p pulmonary thrombectomy 7/13: Patient transferred to ICU for severe agitation requiring Precedex drip.  PCCM consulted 12/25/20- patient was combative and fell out of bed with confusion and hallucinations, accusing staff of shooting him in abdomen and has labored breathing, elevated risk of cardiopulmonary arrest due to drug/alcohol withdrawal,  unable to protect airway 7/14 intubated,sedated, severe resp failure  Consults:  PCCM Vascular Cardiology Procedures:  7/13: S/p pulmonary thrombectomy  Significant Diagnostic Tests:  7/11: Chest Xray>Mild congestive heart failure pulm edema without focal consolidation, effusion or pneumothorax. 7/11: CTA Chest>acute moderate clot burden bilateral pulmonary emboli no saddle embolus.  Mild to moderate cardiomegaly.  No signs of right heart strain.  Normal RV/LV ratio 7/13: Noncontrast CT head> Micro Data:  7/11: SARS-CoV-2 PCR>> negative 7/11: Influenza PCR>> negative  Antimicrobials:  None  OBJECTIVE  Blood pressure 111/81, pulse (!) 120, temperature 98.6 F (37 C), temperature source Oral, resp. rate 16, height '5\' 11"'  (1.803 m), weight 117.9 kg, SpO2 96 %.   CC Follow up resp failure  HPI Remains intubated On vent On pressors Progressive renal failure  REVIEW OF SYSTEMS  PATIENT IS UNABLE TO PROVIDE COMPLETE REVIEW OF  SYSTEMS DUE TO SEVERE CRITICAL ILLNESS AND TOXIC METABOLIC ENCEPHALOPATHY      Vent Mode: PRVC FiO2 (%):  [30 %-40 %] 35 % Set Rate:  [15 bmp-16 bmp] 16 bmp Vt Set:  [500 mL] 500 mL PEEP:  [5 cmH20-8 cmH20] 8 cmH20 Plateau Pressure:  [18 cmH20-19 cmH20] 19 cmH20   Intake/Output Summary (Last 24 hours) at 12/26/2020 1012 Last data filed at 12/26/2020 1000 Gross per 24 hour  Intake 1553.76 ml  Output 200 ml  Net 1353.76 ml    Filed Weights   12/22/20 1353  Weight: 117.9 kg   PHYSICAL EXAMINATION:  GENERAL:critically ill appearing, +resp distress HEAD: Normocephalic, atraumatic.  EYES: Pupils equal, round, reactive to light.  No scleral icterus.  MOUTH: Moist mucosal membrane. NECK: Supple.  PULMONARY: +rhonchi, +wheezing CARDIOVASCULAR: S1 and S2. Regular rate and rhythm. No murmurs, rubs, or gallops.  GASTROINTESTINAL: Soft, nontender, -distended. Positive bowel sounds.  MUSCULOSKELETAL: No swelling, clubbing, or edema.  NEUROLOGIC: obtunded SKIN:intact,warm,dry     Labs   CBC: Recent Labs  Lab 12/23/20 0653 12/23/20 1521 12/24/20 0656 12/25/20 0808 12/26/20 0832  WBC 6.9 7.8 6.4 6.0 10.8*  NEUTROABS  --   --   --  3.2  --   HGB 14.6 14.8 14.6 13.9 13.1  HCT 43.2 43.4 41.9 42.4 39.7  MCV 91.3 93.7 93.1 95.7 96.6  PLT 253 234 241 178 210     Basic Metabolic Panel: Recent Labs  Lab 12/23/20 1521 12/24/20 0656 12/24/20 2232 12/25/20 0808 12/25/20 1807 12/26/20 0832  NA  --  140 140 140 141 141  K  --  4.2 4.0 5.2* 5.1 5.6*  CL  --  108 111 109 112* 111  CO2  --  25 23 20* 20* 20*  GLUCOSE  --  170* 136* 178* 184* 139*  BUN  --  '18 20 21 ' 25* 30*  CREATININE  --  1.25* 1.26* 1.49* 1.65* 2.48*  CALCIUM  --  9.7 9.1 9.1 9.0 8.4*  MG 2.0  --  2.2  --   --   --   PHOS 3.7  --   --   --   --   --     GFR: Estimated Creatinine Clearance: 39.8 mL/min (A) (by C-G formula based on SCr of 2.48 mg/dL (H)). Recent Labs  Lab 12/22/20 1421 12/23/20 0653  12/23/20 1521 12/24/20 0656 12/25/20 0808 12/26/20 0832  PROCALCITON <0.10  --   --   --   --   --   WBC 6.6   < > 7.8 6.4 6.0 10.8*   < > = values in this interval not displayed.     Liver Function Tests: No results for input(s): AST, ALT, ALKPHOS, BILITOT, PROT, ALBUMIN in the last 168 hours. No results for input(s): LIPASE, AMYLASE in the last 168 hours. No results for input(s): AMMONIA in the last 168 hours.  ABG    Component Value Date/Time   PHART 7.30 (L) 12/25/2020 1745   PCO2ART 40 12/25/2020 1745   PO2ART 82 (L) 12/25/2020 1745   HCO3 19.7 (L) 12/25/2020 1745   ACIDBASEDEF 6.3 (H) 12/25/2020 1745   O2SAT 94.7 12/25/2020 1745      Coagulation Profile: Recent Labs  Lab 12/22/20 1500  INR 1.0     Cardiac Enzymes: No results for input(s): CKTOTAL, CKMB, CKMBINDEX, TROPONINI in the last 168 hours.  HbA1C: Hemoglobin A1C  Date/Time  Value Ref Range Status  10/03/2011 05:33 AM 6.2 4.2 - 6.3 % Final    Comment:    The American Diabetes Association recommends that a primary goal of therapy should be <7% and that physicians should reevaluate the treatment regimen in patients with HbA1c values consistently >8%.    Hgb A1c MFr Bld  Date/Time Value Ref Range Status  12/22/2020 10:57 PM 8.7 (H) 4.8 - 5.6 % Final    Comment:    (NOTE) Pre diabetes:          5.7%-6.4%  Diabetes:              >6.4%  Glycemic control for   <7.0% adults with diabetes   08/20/2019 02:09 PM 13.5 (H) 4.8 - 5.6 % Final    Comment:    (NOTE) Pre diabetes:          5.7%-6.4% Diabetes:              >6.4% Glycemic control for   <7.0% adults with diabetes     CBG: Recent Labs  Lab 12/25/20 0716 12/25/20 1107 12/25/20 1523 12/25/20 2133 12/26/20 0720  GLUCAP 154* 183* 159* 163* 133*   Past Medical History  He,  has a past medical history of Coronary artery disease, Diabetes mellitus without complication ([place]), Hypertension, and S/P CABG x 1.   Surgical History    Past  Surgical History:  Procedure Laterality Date   ANKLE ARTHROSCOPY W/ OPEN REPAIR Right    CORONARY ARTERY BYPASS GRAFT     PULMONARY THROMBECTOMY Bilateral 12/24/2020   Procedure: PULMONARY THROMBECTOMY;  Surgeon: Algernon Huxley, MD;  Location: [facility];  Service: Cardiovascular;  Laterality: Bilateral;     Allergies Allergies  Allergen Reactions   Amoxicillin Swelling   Keflex [Cephalexin] Rash     Home Medications  Prior to Admission medications   Medication Sig Start Date End Date Taking? Authorizing Provider  metFORMIN (GLUCOPHAGE) 1000 MG tablet Take 1 tablet (1,000 mg total) by mouth 2 (two) times daily with a meal. 08/21/19  Yes Dhungel, Nishant, MD  naproxen sodium (ALEVE) 220 MG tablet Take 440 mg by mouth daily as needed.   Yes [provider]  blood glucose meter kit and supplies KIT Dispense based on patient and insurance preference. Use up to four times daily as directed. (FOR ICD-9 250.00, 250.01). 08/21/19   Dhungel, Nishant, MD  glimepiride (AMARYL) 2 MG tablet Take 1 tablet (2 mg total) by mouth daily with breakfast. Patient not taking: Reported on 12/22/2020 08/21/19   Dhungel, Flonnie Overman, MD  hydrOXYzine (ATARAX/VISTARIL) 25 MG tablet Take 25 mg by mouth 3 (three) times daily as needed for itching. Patient not taking: Reported on 12/22/2020    [provider]  linezolid (ZYVOX) 600 MG tablet Take 1 tablet (600 mg total) by mouth 2 (two) times daily. Patient not taking: Reported on 12/22/2020 10/18/19   Tsosie Billing, MD  losartan (COZAAR) 50 MG tablet Take 1 tablet (50 mg total) by mouth daily. Patient not taking: Reported on 12/22/2020 08/21/19   Dhungel, Flonnie Overman, MD  sitaGLIPtin (JANUVIA) 100 MG tablet Take 1 tablet (100 mg total) by mouth daily. Patient not taking: Reported on 12/22/2020 08/21/19   Dhungel, Flonnie Overman, MD    Scheduled Meds:  chlorhexidine gluconate (MEDLINE KIT)  15 mL Mouth Rinse BID   Chlorhexidine Gluconate Cloth  6 each Topical  Daily   folic acid  1 mg Oral Daily   insulin aspart  0-15 Units Subcutaneous  TID WC   insulin aspart  0-5 Units Subcutaneous QHS   levalbuterol  0.63 mg Nebulization Q6H   mouth rinse  15 mL Mouth Rinse 10 times per day   multivitamin with minerals  1 tablet Oral Daily   nicotine  21 mg Transdermal Daily   thiamine  100 mg Oral Daily   Or   thiamine  100 mg Intravenous Daily   Continuous Infusions:  fentaNYL infusion INTRAVENOUS 200 mcg/hr (12/26/20 1000)   heparin 1,550 Units/hr (12/26/20 1000)   midazolam 2 mg/hr (12/25/20 2118)   norepinephrine (LEVOPHED) Adult infusion 5 mcg/min (12/26/20 1000)   PRN Meds:.acetaminophen **OR** acetaminophen, HYDROcodone-acetaminophen, midazolam, ondansetron **OR** ondansetron (ZOFRAN) IV, senna-docusate  Assessment & Plan:  Acute Bilateral Pulmonary Embolism -Status post thrombectomy POD#2 Severe ACUTE Hypoxic and Hypercapnic Respiratory Failure -continue Mechanical Ventilator support -continue Bronchodilator Therapy -Wean Fio2 and PEEP as tolerated -VAP/VENT bundle implementation Unable to wean    NEUROLOGY ACUTE TOXIC METABOLIC ENCEPHALOPATHY -need for sedation -Goal RASS -2 to -3 Progressive DELIRIUM Intubated, sedated Start high dose Thiamine Consider MRI brain to assess   B/L PE -Will need long term anticoagulation -Vascular following, appreciate input  Drug and alcohol withdrawal syndrome +cocaine  CARDIAC FAILURE WITH New onset AFIB +RVR  Potential Primary Causes pulmonary in the setting of PE -Cardiology Consultation    ELECTROLYTES -follow labs as needed -replace as needed -pharmacy consultation and following    GI GI PROPHYLAXIS as indicated  NUTRITIONAL STATUS DIET-->TF's as tolerated Constipation protocol as indicated   Best practice:  Diet:  NPO Pain/Anxiety/Delirium protocol (if indicated): Yes (RASS goal -1) VAP protocol (if indicated): Not indicated DVT prophylaxis: Subcutaneous Heparin GI  prophylaxis: PPI Glucose control:  SSI Yes Central venous access:  N/A Arterial line:  N/A Foley:  N/A Mobility:  bed rest  PT consulted: N/A Last date of multidisciplinary goals of care discussion [7/13] Code Status:  full code Disposition:ICU      DVT/GI PRX  assessed I Assessed the need for Labs I Assessed the need for Foley I Assessed the need for Central Venous Line Family Discussion when available I Assessed the need for Mobilization I made an Assessment of medications to be adjusted accordingly Safety Risk assessment completed  CASE DISCUSSED IN MULTIDISCIPLINARY ROUNDS WITH ICU TEAM     Critical Care Time devoted to patient care services described in this note is 60 minutes.  Critical care was necessary to treat /prevent imminent and life-threatening deterioration. Overall, patient is critically ill, prognosis is guarded.  Patient with Multiorgan failure and at high risk for cardiac arrest and death.   Will need to contact family/wife   Corrin Parker, M.D.  [facility]  Medical Director [place] Director [facility] Cardio-Pulmonary Department

## 2020-12-26 NOTE — Progress Notes (Signed)
ANTICOAGULATION CONSULT NOTE  Pharmacy Consult for heparin Indication: pulmonary embolus  Patient Measurements: Heparin Dosing Weight: 101 kg  Labs: Recent Labs    12/24/20 0656 12/24/20 2232 12/25/20 0808 12/25/20 1406 12/25/20 1807 12/26/20 0832 12/26/20 1628  HGB 14.6  --  13.9  --   --  13.1  --   HCT 41.9  --  42.4  --   --  39.7  --   PLT 241  --  178  --   --  210  --   HEPARINUNFRC 0.25*   < > 0.47 0.65  --  0.76* 0.59  CREATININE 1.25*   < > 1.49*  --  1.65* 2.48* 2.02*   < > = values in this interval not displayed.     Estimated Creatinine Clearance: 48.9 mL/min (A) (by C-G formula based on SCr of 2.02 mg/dL (H)).   Medical History: Past Medical History:  Diagnosis Date   Coronary artery disease    Diabetes mellitus without complication (HCC)    Hypertension    S/P CABG x 1     Assessment: 65 year old male w/ h/o CAD (s/p CABG), HTN, IIDM, came with new onset of palpitations and SOB. Elevation in troponin concerning for NSTEMI, w/ further w/u revealing acute Bil PE. Pharmacy consulted for mgmt of heparin drip.  7/11: CTA positive for acute moderate clot burden bilateral pulmonary emboli no saddle embolus.  Mild to moderate cardiomegaly.  No signs of right heart strain.  Normal RV/LV ratio.  Goal of Therapy:  Heparin level 0.3-0.7 units/ml Monitor platelets by anticoagulation protocol: Yes  Date Time aPTT/HL Rate/Comment 07/12 2257     0.37  Thera x1 07/12 0653     0.35  Thera x2 07/13 0656     0.25  Subthera 07/13 2232     0.10  Subthera 07/14 0808     0.47  Thera x 1 07/14   1406     0.65  Thera x 2 07/15 0832     0.76   Suprathera 07/15 1628     0.59  Therapeutic   Baseline Labs: aPTT - 30s INR - 1.0 Hgb - 15.4>14.6>13.9>13.1 Plts - 252>253>178>210  Plan:  Heparin level is therapeutic Continue heparin infusion at 1450 units/hr Re-check HL at 2300 to confirm CBC daily while on heparin infusion  Pricilla Riffle, PharmD, BCPS   12/26/2020 5:00 PM

## 2020-12-26 NOTE — Progress Notes (Signed)
[facility] Daily Progress Note  12/24/20:             1.  Contrast injection right heart             2.  Thrombolysis with 6 mg tPA to the pulmonary arteries             3.  Mechanical thrombectomy right lower lobe, right middle lobe, right upper lobe pulmonary arteries with the penumbra CAT 8 catheter as well as mechanical thrombectomy to the left lower and left upper lobe pulmonary arteries with the penumbra CAT 8 catheter             4.  Selective catheter placement right lower lobe, right middle lobe, and right upper lobe pulmonary artery             5.  Selective catheter placement left lower lobe and left after left heart  Subjective: Patient with respiratory decompensation yesterday and subsequent intubation.  Patient also had more than one episode of agitation and aggression requiring multiple staff members to be able to control the patient.   Objective: Vitals:   12/26/20 0600 12/26/20 0724 12/26/20 0800 12/26/20 0900  BP: 104/71   111/81  Pulse: (!) 48 (!) 115  (!) 120  Resp: 14 19 16 15   Temp:   98.6 F (37 C)   TempSrc:   Oral   SpO2: 96% 95%  96%  Weight:      Height:        Intake/Output Summary (Last 24 hours) at 12/26/2020 0945 Last data filed at 12/26/2020 0900 Gross per 24 hour  Intake 1499.44 ml  Output 200 ml  Net 1299.44 ml   Physical Exam: Sedated and intubated.  In no acute distress. CV: Tachycardic Pulmonary: Intubated.  Breath sounds equally. Abdomen: Soft, Nontender, Nondistended Right groin:  PAD has been removed.  Access site is clean dry and intact.  No swelling or ecchymosis noted. Vascular: Warm distally in toes.  Minimal edema.   Laboratory: CBC    Component Value Date/Time   WBC 10.8 (H) 12/26/2020 0832   HGB 13.1 12/26/2020 0832   HGB 13.4 10/02/2011 0842   HCT 39.7 12/26/2020 0832   HCT 40.8 10/02/2011 0842   PLT 210 12/26/2020 0832   PLT 262 10/02/2011 0842   BMET    Component Value Date/Time   NA 141  12/26/2020 0832   NA 146 (H) 10/03/2011 0533   K 5.6 (H) 12/26/2020 0832   K 3.8 10/03/2011 0533   CL 111 12/26/2020 0832   CL 111 (H) 10/03/2011 0533   CO2 20 (L) 12/26/2020 0832   CO2 24 10/03/2011 0533   GLUCOSE 139 (H) 12/26/2020 0832   GLUCOSE 107 (H) 10/03/2011 0533   BUN 30 (H) 12/26/2020 0832   BUN 8 10/03/2011 0533   CREATININE 2.48 (H) 12/26/2020 0832   CREATININE 1.11 10/03/2011 0533   CALCIUM 8.4 (L) 12/26/2020 0832   CALCIUM 8.7 10/03/2011 0533   GFRNONAA 28 (L) 12/26/2020 0832   GFRNONAA >60 10/03/2011 0533   GFRAA >60 09/02/2019 1534   GFRAA >60 10/03/2011 0533   Assessment/Planning: Assessment/Planning: The patient is a 65 year old male with multiple medical issues including known drug and alcohol abuse who was admitted with moderate thrombectomy/thrombolysis - POD#2   1) Patient was given Versed and Fentanyl as per our usual sedation protocol on 12/24/20. 2) Patient with continued agitation/aggression yesterday with subsequent respiratory decompensation requiring intubation.  Also now with  new onset A. Fib. Still on heparin drip. 4) Patient with progressively worsening creatinine. AM BMP. 5) Elevated white blood cell count today. AM CBC. 6) no DVT found on initial duplex (12/23/20) 7) Will need to transition to PO anticoagulation when the patient is more stable and able to tolerate. 8) No further recommendations are vascular at this time.  We will continue to follow peripherally.  Discussed with Dr. Wallis Mart Hareem Surowiec PA-C 12/26/2020 9:45 AM

## 2020-12-26 NOTE — Consult Note (Signed)
PHARMACY CONSULT NOTE  Pharmacy Consult for Electrolyte Monitoring and Replacement   Recent Labs: Potassium (mmol/L)  Date Value  12/26/2020 5.6 (H)  10/03/2011 3.8   Magnesium (mg/dL)  Date Value  29/51/8841 2.2   Calcium (mg/dL)  Date Value  66/11/3014 8.4 (L)   Calcium, Total (mg/dL)  Date Value  06/22/3233 8.7   Albumin (g/dL)  Date Value  57/32/2025 4.1  10/02/2011 3.6   Phosphorus (mg/dL)  Date Value  42/70/6237 3.7   Sodium (mmol/L)  Date Value  12/26/2020 141  10/03/2011 146 (H)   Assessment: Patient is a 65 y/o M with medical history including polysubstance abuse, diabetes, HTN , CAD s/p CABG, Afib who is admitted with acute bilateral PE. He is s/p thrombectomy. Patient with acute metabolic encephalopathy requiring intubation 7/14. He is currently intubated, sedated, and on mechanical ventilation in the ICU. Pharmacy consulted to assist with electrolyte monitoring and replacement as indicated.  Scr trending up (1.04 >> 2.48)  Tube feeds started 7/15. Patient is at risk for re-feeding  Goal of Therapy:  K > 4 Mg > 2 All other electrolytes within normal limits  Plan:  --K 5.6, Lokelma 10 g x 1 dose and start isotonic bicarbonate drip at 50 mL/hr x 1L --Will re-check a BMP at 1700 today with next heparin level --Follow-up all other electrolytes with AM labs tomorrow  Tressie Ellis 12/26/2020 1:59 PM

## 2020-12-26 NOTE — Progress Notes (Signed)
Discuss with pharmacy regarding NaHCO3 infusion. Advised to finish the whole bag and then stop.

## 2020-12-27 ENCOUNTER — Inpatient Hospital Stay: Payer: Medicaid Other

## 2020-12-27 DIAGNOSIS — I214 Non-ST elevation (NSTEMI) myocardial infarction: Secondary | ICD-10-CM | POA: Diagnosis not present

## 2020-12-27 DIAGNOSIS — N179 Acute kidney failure, unspecified: Secondary | ICD-10-CM | POA: Diagnosis not present

## 2020-12-27 DIAGNOSIS — J9601 Acute respiratory failure with hypoxia: Secondary | ICD-10-CM | POA: Diagnosis not present

## 2020-12-27 LAB — GLUCOSE, CAPILLARY
Glucose-Capillary: 135 mg/dL — ABNORMAL HIGH (ref 70–99)
Glucose-Capillary: 140 mg/dL — ABNORMAL HIGH (ref 70–99)
Glucose-Capillary: 187 mg/dL — ABNORMAL HIGH (ref 70–99)
Glucose-Capillary: 182 mg/dL — ABNORMAL HIGH (ref 70–99)
Glucose-Capillary: 158 mg/dL — ABNORMAL HIGH (ref 70–99)
Glucose-Capillary: 163 mg/dL — ABNORMAL HIGH (ref 70–99)

## 2020-12-27 LAB — BASIC METABOLIC PANEL
Anion gap: 5 (ref 5–15)
BUN: 24 mg/dL — ABNORMAL HIGH (ref 8–23)
CO2: 26 mmol/L (ref 22–32)
Calcium: 8.4 mg/dL — ABNORMAL LOW (ref 8.9–10.3)
Chloride: 110 mmol/L (ref 98–111)
Creatinine, Ser: 1.52 mg/dL — ABNORMAL HIGH (ref 0.61–1.24)
GFR, Estimated: 51 mL/min — ABNORMAL LOW (ref >60)
Glucose, Bld: 162 mg/dL — ABNORMAL HIGH (ref 70–99)
Potassium: 4.4 mmol/L (ref 3.5–5.1)
Sodium: 141 mmol/L (ref 135–145)

## 2020-12-27 LAB — CBC
HCT: 35.4 % — ABNORMAL LOW (ref 39.0–52.0)
Hemoglobin: 11.5 g/dL — ABNORMAL LOW (ref 13.0–17.0)
MCH: 31.8 pg (ref 26.0–34.0)
MCHC: 32.5 g/dL (ref 30.0–36.0)
MCV: 97.8 fL (ref 80.0–100.0)
Platelets: 182 10*3/uL (ref 150–400)
RBC: 3.62 MIL/uL — ABNORMAL LOW (ref 4.22–5.81)
RDW: 13.6 % (ref 11.5–15.5)
WBC: 7.9 10*3/uL (ref 4.0–10.5)
nRBC: 0 % (ref 0.0–0.2)

## 2020-12-27 LAB — HEPARIN LEVEL (UNFRACTIONATED): Heparin Unfractionated: 0.38 IU/mL (ref 0.30–0.70)

## 2020-12-27 LAB — MAGNESIUM: Magnesium: 2.2 mg/dL (ref 1.7–2.4)

## 2020-12-27 LAB — PHOSPHORUS: Phosphorus: 4.5 mg/dL (ref 2.5–4.6)

## 2020-12-27 MED ORDER — SODIUM CHLORIDE 0.9 % IV BOLUS
500.0000 mL | Freq: Once | INTRAVENOUS | Status: AC
  Administered 2020-12-27: 500 mL via INTRAVENOUS

## 2020-12-27 NOTE — Progress Notes (Signed)
ANTICOAGULATION CONSULT NOTE  Pharmacy Consult for heparin Indication: pulmonary embolus  Patient Measurements: Heparin Dosing Weight: 101 kg  Labs: Recent Labs    12/24/20 0656 12/24/20 2232 12/25/20 0808 12/25/20 1406 12/25/20 1807 12/26/20 0832 12/26/20 1628 12/26/20 2103 12/26/20 2259  HGB 14.6  --  13.9  --   --  13.1  --   --   --   HCT 41.9  --  42.4  --   --  39.7  --   --   --   PLT 241  --  178  --   --  210  --   --   --   HEPARINUNFRC 0.25*   < > 0.47   < >  --  0.76* 0.59  --  0.51  CREATININE 1.25*   < > 1.49*  --  1.65* 2.48* 2.02*  --   --   CKMB  --   --   --   --   --   --   --  3.0  --    < > = values in this interval not displayed.     Estimated Creatinine Clearance: 48.9 mL/min (A) (by C-G formula based on SCr of 2.02 mg/dL (H)).   Medical History: Past Medical History:  Diagnosis Date   Coronary artery disease    Diabetes mellitus without complication (HCC)    Hypertension    S/P CABG x 1     Assessment: 65 year old male w/ h/o CAD (s/p CABG), HTN, IIDM, came with new onset of palpitations and SOB. Elevation in troponin concerning for NSTEMI, w/ further w/u revealing acute Bil PE. Pharmacy consulted for mgmt of heparin drip.  7/11: CTA positive for acute moderate clot burden bilateral pulmonary emboli no saddle embolus.  Mild to moderate cardiomegaly.  No signs of right heart strain.  Normal RV/LV ratio.  Goal of Therapy:  Heparin level 0.3-0.7 units/ml Monitor platelets by anticoagulation protocol: Yes  Date Time aPTT/HL Rate/Comment 07/12 2257     0.37  Thera x1 07/12 0653     0.35  Thera x2 07/13 0656     0.25  Subthera 07/13 2232     0.10  Subthera 07/14 0808     0.47  Thera x 1 07/14   1406     0.65  Thera x 2 07/15 0832     0.76   Suprathera 07/15 1628     0.59  Therapeutic  07/15 2259     0.51  Therapeutic x 2  Baseline Labs: aPTT - 30s INR - 1.0 Hgb - 15.4>14.6>13.9>13.1 Plts - 252>253>178>210  Plan:  Heparin level is  therapeutic Continue heparin infusion at 1450 units/hr Re-check HL daily with AM labs CBC daily while on heparin infusion  Otelia Sergeant, PharmD, [place] 12/27/2020 1:29 AM

## 2020-12-27 NOTE — Progress Notes (Signed)
ANTICOAGULATION CONSULT NOTE  Pharmacy Consult for heparin Indication: pulmonary embolus  Patient Measurements: Heparin Dosing Weight: 101 kg  Labs: Recent Labs    12/25/20 0808 12/25/20 1406 12/26/20 0832 12/26/20 1628 12/26/20 2103 12/26/20 2259 12/27/20 0502  HGB 13.9  --  13.1  --   --   --  11.5*  HCT 42.4  --  39.7  --   --   --  35.4*  PLT 178  --  210  --   --   --  182  HEPARINUNFRC 0.47   < > 0.76* 0.59  --  0.51 0.38  CREATININE 1.49*   < > 2.48* 2.02*  --   --  1.52*  CKMB  --   --   --   --  3.0  --   --    < > = values in this interval not displayed.     Estimated Creatinine Clearance: 64.9 mL/min (A) (by C-G formula based on SCr of 1.52 mg/dL (H)).   Medical History: Past Medical History:  Diagnosis Date   Coronary artery disease    Diabetes mellitus without complication (HCC)    Hypertension    S/P CABG x 1     Assessment: 65 year old male w/ h/o CAD (s/p CABG), HTN, IIDM, came with new onset of palpitations and SOB. Elevation in troponin concerning for NSTEMI, w/ further w/u revealing acute Bil PE. Pharmacy consulted for mgmt of heparin drip.  7/11: CTA positive for acute moderate clot burden bilateral pulmonary emboli no saddle embolus.  Mild to moderate cardiomegaly.  No signs of right heart strain.  Normal RV/LV ratio.  Goal of Therapy:  Heparin level 0.3-0.7 units/ml Monitor platelets by anticoagulation protocol: Yes  Date Time aPTT/HL Rate/Comment 07/12 2257     0.37  Thera x1 07/12 0653     0.35  Thera x2 07/13 0656     0.25  Subthera 07/13 2232     0.10  Subthera 07/14 0808     0.47  Thera x 1 07/14   1406     0.65  Thera x 2 07/15 0832     0.76   Suprathera 07/15 1628     0.59  Therapeutic  07/15 2259     0.51  Therapeutic x 2 07/15 0502     0.38  Therapeutic  Baseline Labs: aPTT - 30s INR - 1.0 Hgb - 15.4>14.6>13.9>13.1 Plts - 252>253>178>210  Plan:  Heparin level is therapeutic Continue heparin infusion at 1450  units/hr Re-check HL daily with AM labs CBC daily while on heparin infusion  Otelia Sergeant, PharmD, [facility] 12/27/2020 5:59 AM

## 2020-12-27 NOTE — Progress Notes (Signed)
3 Days Post-Op   Subjective/Chief Complaint: Patient sedated and intubated.   Objective: Vital signs in last 24 hours: Temp:  [98.2 F (36.8 C)-99.86 F (37.7 C)] 99.32 F (37.4 C) (07/16 0800) Pulse Rate:  [96-122] 119 (07/16 0800) Resp:  [14-21] 20 (07/16 0800) BP: (99-129)/(61-92) 110/75 (07/16 0800) SpO2:  [93 %-98 %] 96 % (07/16 0800) FiO2 (%):  [35 %] 35 % (07/16 0800) Weight:  [113.9 kg] 113.9 kg (07/16 0500) Last BM Date:  (PTA)  Intake/Output from previous day: 07/15 0701 - 07/16 0700 In: 3035 [I.V.:2093.6; NG/GT:411.7; IV Piggyback:529.8] Out: 1815 [Urine:1815] Intake/Output this shift: Total I/O In: 201.4 [I.V.:41.4; NG/GT:160] Out: 200 [Urine:200] Physical Exam Intubated and sedated Tachycardic, sating 95%  Lab Results:  Recent Labs    12/26/20 0832 12/27/20 0502  WBC 10.8* 7.9  HGB 13.1 11.5*  HCT 39.7 35.4*  PLT 210 182   BMET Recent Labs    12/26/20 1628 12/27/20 0502  NA 137 141  K 4.0 4.4  CL 107 110  CO2 22 26  GLUCOSE 284* 162*  BUN 28* 24*  CREATININE 2.02* 1.52*  CALCIUM 8.0* 8.4*   PT/INR No results for input(s): LABPROT, INR in the last 72 hours. ABG Recent Labs    12/25/20 1745  PHART 7.30*  HCO3 19.7*    Studies/Results: DG Abd 1 View  Result Date: 12/25/2020 CLINICAL DATA:  Check gastric catheter placement EXAM: ABDOMEN - 1 VIEW COMPARISON:  None. FINDINGS: Gastric catheter is noted within the stomach. Scattered large and small bowel gas is noted. No free air is seen. IMPRESSION: Gastric catheter within the stomach. Electronically Signed   By: Alcide Clever M.D.   On: 12/25/2020 18:35   CT HEAD WO CONTRAST  Result Date: 12/26/2020 CLINICAL DATA:  65 year old male status post acute pulmonary emboli. Altered mental status with age indeterminate hypodensity in the right caudate on head CT yesterday. Found with altered mental status now. Intubated. EXAM: CT HEAD WITHOUT CONTRAST TECHNIQUE: Contiguous axial images were  obtained from the base of the skull through the vertex without intravenous contrast. COMPARISON:  Head CT 12/25/2020. FINDINGS: Brain: Unchanged asymmetric hypodensity in the right caudate (series 6, image 16) and patchy bilateral cerebral white matter hypodensity since yesterday. Focal cystic encephalomalacia near the body of the right corpus callosum is stable on sagittal image 25. Small chronic appearing lacunar infarct of the right cerebellum on series 6, image 23 is stable. No midline shift, ventriculomegaly, mass effect, evidence of mass lesion, intracranial hemorrhage or evidence of cortically based acute infarction. No cortical encephalomalacia identified. Vascular: Calcified atherosclerosis at the skull base. No suspicious intracranial vascular hyperdensity. Skull: Stable and intact. Sinuses/Orbits: Visualized paranasal sinuses and mastoids are stable and well aerated. Other: No acute orbit or scalp soft tissue finding. Fluid in the visible pharynx related to intubation. IMPRESSION: 1. Unchanged age indeterminate small vessel disease in the cerebral hemispheres, greater on the right. 2. No new intracranial abnormality. Electronically Signed   By: Odessa Fleming M.D.   On: 12/26/2020 04:33   CT CERVICAL SPINE WO CONTRAST  Result Date: 12/26/2020 CLINICAL DATA:  65 year old male status post acute pulmonary emboli. Altered mental status with age indeterminate hypodensity in the right caudate on head CT yesterday. Found with altered mental status now. Intubated. EXAM: CT CERVICAL SPINE WITHOUT CONTRAST TECHNIQUE: Multidetector CT imaging of the cervical spine was performed without intravenous contrast. Multiplanar CT image reconstructions were also generated. COMPARISON:  Head CT today. FINDINGS: Alignment: Mild reversal of cervical  lordosis. Cervicothoracic junction alignment is within normal limits. Bilateral posterior element alignment is within normal limits. Skull base and vertebrae: Visualized skull base is  intact. No atlanto-occipital dissociation. C1 and C2 appear intact and aligned. No acute osseous abnormality identified. Soft tissues and spinal canal: No prevertebral fluid or swelling. No visible canal hematoma. Intubated and enteric tube in place. Bubbly opacity in the pharynx. Bilateral calcified carotid artery atherosclerosis in the neck. Disc levels: Moderate to severe cervical disc and endplate degeneration C5-C6 and C6-C7. Evidence of mild spinal stenosis at those levels. Mild to moderate degeneration elsewhere. Upper chest: T1 level appears intact. Negative visible lung apices. The endotracheal and enteric tubes course appropriately into the trachea and esophagus, respectively. IMPRESSION: 1. No acute osseous abnormality identified in the cervical spine. 2. 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 3. Satisfactory visible endotracheal and enteric tubes. Electronically Signed   By: Odessa Fleming M.D.   On: 12/26/2020 04:36   DG Chest Port 1 View  Result Date: 12/26/2020 CLINICAL DATA:  Status post peripherally inserted central catheter (PICC) central line placement. EXAM: PORTABLE CHEST 1 VIEW COMPARISON:  Prior chest radiographs 12/25/2020 and earlier. FINDINGS: A right-sided PICC is now present with tip projecting at the level of the lower SVC. The ET tube terminates just below the level of the clavicular heads. An enteric tube passes below the level of the left hemidiaphragm with tip excluded from the field of view. Prior median sternotomy and CABG. Unchanged cardiomegaly. Central pulmonary vascular congestion. Minimal atelectasis at the right lung base. No sizable pleural effusion or evidence of pneumothorax. No acute bony abnormality identified. IMPRESSION: Right-sided PICC with tip projecting at the level of the lower SVC. Position of additional lines/tubes, as described. Unchanged cardiomegaly with central pulmonary vascular congestion. Minimal  atelectasis at the right lung base. Electronically Signed   By: Jackey Loge DO   On: 12/26/2020 13:36   DG Chest Port 1 View  Result Date: 12/25/2020 CLINICAL DATA:  Status post intubation, initial encounter EXAM: PORTABLE CHEST 1 VIEW COMPARISON:  12/22/2020 FINDINGS: Endotracheal tube is noted in satisfactory position at the level of the aortic arch. Gastric catheter extends into the stomach. Postsurgical changes are again seen. Cardiomegaly is again noted. Mild linear atelectasis is noted in the mid lungs bilaterally stable from the prior exam. No sizable effusion is seen. Mild vascular congestion is seen. IMPRESSION: Mild vascular congestion stable from the prior exam. Tubes and lines as described. Electronically Signed   By: Alcide Clever M.D.   On: 12/25/2020 18:35   [place] EKG SITE RITE  Result Date: 12/26/2020 If Site Rite image not attached, placement could not be confirmed due to current cardiac rhythm.   Anti-infectives: Anti-infectives (From admission, onward)    Start     Dose/Rate Route Frequency Ordered Stop   12/24/20 1500  clindamycin (CLEOCIN) IVPB 300 mg        300 mg 100 mL/hr over 30 Minutes Intravenous  Once 12/24/20 1400 12/24/20 1527   12/24/20 1401  clindamycin (CLEOCIN) 300 MG/50ML IVPB       Note to Pharmacy: Rita Ohara   : cabinet override      12/24/20 1401 12/25/20 0214       Assessment/Plan: s/p Procedure(s): PULMONARY THROMBECTOMY (Bilateral) Continue with  current care, pending MRI Brain  LOS: 5 days    Louisa Second 12/27/2020

## 2020-12-27 NOTE — Consult Note (Signed)
PHARMACY CONSULT NOTE  Pharmacy Consult for Electrolyte Monitoring and Replacement   Recent Labs: Potassium (mmol/L)  Date Value  12/27/2020 4.4  10/03/2011 3.8   Magnesium (mg/dL)  Date Value  62/22/9798 2.2   Calcium (mg/dL)  Date Value  92/04/9416 8.4 (L)   Calcium, Total (mg/dL)  Date Value  40/81/4481 8.7   Albumin (g/dL)  Date Value  85/63/1497 4.1  10/02/2011 3.6   Phosphorus (mg/dL)  Date Value  02/63/7858 4.5   Sodium (mmol/L)  Date Value  12/27/2020 141  10/03/2011 146 (H)   Assessment: Patient is a 65 y/o M with medical history including polysubstance abuse, diabetes, HTN , CAD s/p CABG, Afib who is admitted with acute bilateral PE. He is s/p thrombectomy. Patient with acute metabolic encephalopathy requiring intubation 7/14. He is currently intubated, sedated, and on mechanical ventilation in the ICU. Pharmacy consulted to assist with electrolyte monitoring and replacement as indicated.  Scr trending down (1.04 >> 2.48 >> 1.52)  Tube feeds started 7/15. Patient is at risk for re-feeding  Goal of Therapy:  K > 4 Mg > 2 All other electrolytes within normal limits  Plan:  --Hyperkalemia has resolved with improving renal function --No electrolyte replacement warranted at this time --Follow-up electrolytes with AM labs tomorrow  Tressie Ellis 12/27/2020 7:10 AM

## 2020-12-27 NOTE — Progress Notes (Signed)
NAME:  Jeremy [name], MRN:  [id number], DOB:  03-30-1956, LOS: 5 ADMISSION DATE:  12/22/2020  BRIEF SYNOPSIS 65 year old male with significant past medical history of CAD s/p CABG, HTN, llDM, polysubstance abuse, and MRSA infection who presented to the ED on 12/23/2018 with chief complaints of shortness of breath and palpitation x1 week   ED Course: On arrival to the ED, he was afebrile with blood pressure (!) 131/197m Hg and pulse rate (!) 128 beats/min, RR (!) 26. Sats 96% on RA. There were no focal neurological deficits; he was alert and oriented x4, and he did not demonstrate any memory deficits. Labs/Diagnostics BMP shows no significant electrolyte or metabolic derangements.  CBC shows no leukocytosis or acute anemia.  COVID and influenza test is negative EKG: Sinus tachycardia with a ventricular of 127, normal axis, unremarkable intervals with some T wave inversions nonspecific changes in lateral leads and some nonspecific changes in anterior leads without other clear evidence of acute ischemia. Troponin: 74 CXR: Mild congestive heart failure pulm edema without focal consolidation, effusion or pneumothorax. CTA Chest: acute moderate clot burden bilateral pulmonary emboli no saddle embolus.  Mild to moderate cardiomegaly.  No signs of right heart strain.  Normal RV/LV ratio Other Labs D-Dimer elevated at 2.9. Given positive finding of bilateral pulmonary embolus Szymon as above and elevated troponin concerning for NSTEMI, patient was started on heparin drip and admitted under hospitalist service for further management   Hospital Course: During the course of his admission patient remained visibly short of breath, tachypneic and tachycardic at rest.  Vascular surgeon was consulted who deemed him appropriate for pulmonary thrombectomy.  Patient underwent pulmonary thrombectomy on 12/24/2020.  Per nursing staff, following the thrombectomy procedure patient became agitated and irritable trying  to leave AMA.  Security was called and patient subsequently received 0.5 mg IV Ativan, 5 mg IM Haldol, 5 mg IM Valium, 2 mg Versed, Zyprexa 10 mg IM with no effect.  He remained combative, violently agitated requiring multiple security at the bedside.  Patient was transferred to ICU for Precedex drip and PCCM consulted.   Past Medical History  CAD s/p CABG, HTN, llDM, polysubstance abuse, and MRSA infection   Significant Hospital Events  7/11: Admitted to PCU with bilateral pulmonary embolism 7/13: S/p pulmonary thrombectomy 7/13: Patient transferred to ICU for severe agitation requiring Precedex drip.  PCCM consulted 12/25/20- patient was combative and fell out of bed with confusion and hallucinations, accusing staff of shooting him in abdomen and has labored breathing, elevated risk of cardiopulmonary arrest due to drug/alcohol withdrawal,  unable to protect airway 7/14 intubated,sedated, severe resp failure 7/15 remains intubated, severe resp failure, severe encephalopathy   Consults:  PCCM Vascular Cardiology Procedures:  7/13: S/p pulmonary thrombectomy   Significant Diagnostic Tests:  7/11: Chest Xray>Mild congestive heart failure pulm edema without focal consolidation, effusion or pneumothorax. 7/11: CTA Chest>acute moderate clot burden bilateral pulmonary emboli no saddle embolus.  Mild to moderate cardiomegaly.  No signs of right heart strain.  Normal RV/LV ratio 7/13: Noncontrast CT head> Micro Data:  7/11: SARS-CoV-2 PCR>> negative 7/11: Influenza PCR>> negative      Interim History / Subjective:  Remains intubated On pressors Renal failure +COCAINE POISONING      Objective   Blood pressure 100/66, pulse (!) 120, temperature 99.5 F (37.5 C), resp. rate 16, height _0  (1.803 m), weight 113.9 kg, SpO2 94 %.    Vent Mode: PRVC FiO2 (%):  [35 %] 35 % Set Rate:  [  16 bmp] 16 bmp Vt Set:  [500 mL] 500 mL PEEP:  [8 cmH20] 8 cmH20 Plateau Pressure:  [17  cmH20-19 cmH20] 19 cmH20   Intake/Output Summary (Last 24 hours) at 12/27/2020 0713 Last data filed at 12/27/2020 0600 Gross per 24 hour  Intake 2831.15 ml  Output 1815 ml  Net 1016.15 ml   Filed Weights   12/22/20 1353 12/27/20 0500  Weight: 117.9 kg 113.9 kg      REVIEW OF SYSTEMS  PATIENT IS UNABLE TO PROVIDE COMPLETE REVIEW OF SYSTEMS DUE TO SEVERE CRITICAL ILLNESS AND TOXIC METABOLIC ENCEPHALOPATHY   PHYSICAL EXAMINATION:  GENERAL:critically ill appearing, +resp distress HEAD: Normocephalic, atraumatic.  EYES: Pupils equal, round, reactive to light.  No scleral icterus.  MOUTH: Moist mucosal membrane. NECK: Supple. PULMONARY: +rhonchi, +wheezing CARDIOVASCULAR: S1 and S2. Regular rate and rhythm. No murmurs, rubs, or gallops.  GASTROINTESTINAL: Soft, nontender, -distended. Positive bowel sounds.  MUSCULOSKELETAL: No swelling, clubbing, or edema.  NEUROLOGIC: obtunded SKIN:intact,warm,dry   Labs/imaging that I havepersonally reviewed  (right click and "Reselect all SmartList Selections" daily)      ASSESSMENT AND PLAN SYNOPSIS  65 yo Morbidly obese AAM with b/l PE with embolectomy with progressive hypoxia and severe toxic metabolic encephalopathy with severe COCAINE POISONING   Severe ACUTE Hypoxic and Hypercapnic Respiratory Failure -continue Mechanical Ventilator support -continue Bronchodilator Therapy -Wean Fio2 and PEEP as tolerated -VAP/VENT bundle implementation -will perform SAT/SBT when respiratory parameters are met  Vent Mode: PRVC FiO2 (%):  [35 %] 35 % Set Rate:  [16 bmp] 16 bmp Vt Set:  [500 mL] 500 mL PEEP:  [8 cmH20] 8 cmH20 Plateau Pressure:  [17 cmH20-19 cmH20] 19 cmH20   SEVERE COPD EXACERBATION -continue NEB THERAPY as prescribed -morphine as needed -wean fio2 as needed and tolerated   SEVERE DRUG WITHDRAWAL -Therapy with Thiamine and MVI    CARDIAC FAILURE-acute combined systolic/diastolic dysfunction -oxygen as  needed -Lasix as tolerated -follow up cardiac enzymes as indicated  B/L PE -Will need long term anticoagulation -Vascular following, appreciate input  CARDIAC ICU monitoring   ACUTE KIDNEY INJURY/Renal Failure -continue Foley Catheter-assess need -Avoid nephrotoxic agents -Follow urine output, BMP -Ensure adequate renal perfusion, optimize oxygenation -Renal dose medications   Intake/Output Summary (Last 24 hours) at 12/27/2020 0713 Last data filed at 12/27/2020 0600 Gross per 24 hour  Intake 2831.15 ml  Output 1815 ml  Net 1016.15 ml     NEUROLOGY Acute toxic metabolic encephalopathy Goal RASS -2 to -3 Progressive DELIRIUM Intubated, sedated Start high dose Thiamine Consider MRI brain to assess embolic strokes   ENDO - ICU hypoglycemic\Hyperglycemia protocol -check FSBS per protocol   GI GI PROPHYLAXIS as indicated  NUTRITIONAL STATUS DIET-->TF's as tolerated Constipation protocol as indicated   ELECTROLYTES -follow labs as needed -replace as needed -pharmacy consultation and following   Best practice:  Diet:  NPO Pain/Anxiety/Delirium protocol (if indicated): Yes (RASS goal -1) VAP protocol (if indicated): Not indicated DVT prophylaxis: Subcutaneous Heparin GI prophylaxis: PPI Glucose control:  SSI Yes Central venous access:  N/A Arterial line:  N/A Foley:  N/A Mobility:  bed rest  PT consulted: N/A Last date of multidisciplinary goals of care discussion [7/13] Code Status:  full code Disposition:ICU    Labs   CBC: Recent Labs  Lab 12/23/20 1521 12/24/20 0656 12/25/20 0808 12/26/20 0832 12/27/20 0502  WBC 7.8 6.4 6.0 10.8* 7.9  NEUTROABS  --   --  3.2  --   --   HGB 14.8 14.6 13.9  13.1 11.5*  HCT 43.4 41.9 42.4 39.7 35.4*  MCV 93.7 93.1 95.7 96.6 97.8  PLT 234 241 178 210 157    Basic Metabolic Panel: Recent Labs  Lab 12/23/20 1521 12/24/20 0656 12/24/20 2232 12/25/20 0808 12/25/20 1807 12/26/20 0832 12/26/20 1628  12/26/20 2259 12/27/20 0502  NA  --    < > 140 140 141 141 137  --  141  K  --    < > 4.0 5.2* 5.1 5.6* 4.0  --  4.4  CL  --    < > 111 109 112* 111 107  --  110  CO2  --    < > 23 20* 20* 20* 22  --  26  GLUCOSE  --    < > 136* 178* 184* 139* 284*  --  162*  BUN  --    < > 20 21 25* 30* 28*  --  24*  CREATININE  --    < > 1.26* 1.49* 1.65* 2.48* 2.02*  --  1.52*  CALCIUM  --    < > 9.1 9.1 9.0 8.4* 8.0*  --  8.4*  MG 2.0  --  2.2  --   --   --   --  2.0 2.2  PHOS 3.7  --   --   --   --   --   --  4.9* 4.5   < > = values in this interval not displayed.   GFR: Estimated Creatinine Clearance: 63.8 mL/min (A) (by C-G formula based on SCr of 1.52 mg/dL (H)). Recent Labs  Lab 12/22/20 1421 12/23/20 0653 12/24/20 0656 12/25/20 0808 12/26/20 0832 12/27/20 0502  PROCALCITON <0.10  --   --   --   --   --   WBC 6.6   < > 6.4 6.0 10.8* 7.9   < > = values in this interval not displayed.    Liver Function Tests: No results for input(s): AST, ALT, ALKPHOS, BILITOT, PROT, ALBUMIN in the last 168 hours. No results for input(s): LIPASE, AMYLASE in the last 168 hours. No results for input(s): AMMONIA in the last 168 hours.  ABG    Component Value Date/Time   PHART 7.30 (L) 12/25/2020 1745   PCO2ART 40 12/25/2020 1745   PO2ART 82 (L) 12/25/2020 1745   HCO3 19.7 (L) 12/25/2020 1745   ACIDBASEDEF 6.3 (H) 12/25/2020 1745   O2SAT 94.7 12/25/2020 1745     Coagulation Profile: Recent Labs  Lab 12/22/20 1500  INR 1.0    Cardiac Enzymes: Recent Labs  Lab 12/26/20 2103  CKMB 3.0    HbA1C: Hemoglobin A1C  Date/Time Value Ref Range Status  10/03/2011 05:33 AM 6.2 4.2 - 6.3 % Final    Comment:    The American Diabetes Association recommends that a primary goal of therapy should be <7% and that physicians should reevaluate the treatment regimen in patients with HbA1c values consistently >8%.    Hgb A1c MFr Bld  Date/Time Value Ref Range Status  12/22/2020 10:57 PM 8.7 (H) 4.8  - 5.6 % Final    Comment:    (NOTE) Pre diabetes:          5.7%-6.4%  Diabetes:              >6.4%  Glycemic control for   <7.0% adults with diabetes   08/20/2019 02:09 PM 13.5 (H) 4.8 - 5.6 % Final    Comment:    (NOTE) Pre diabetes:  5.7%-6.4% Diabetes:              >6.4% Glycemic control for   <7.0% adults with diabetes     CBG: Recent Labs  Lab 12/26/20 1114 12/26/20 1609 12/26/20 1913 12/26/20 2309 12/27/20 0319  GLUCAP 147* 134* 138* 139* 135*    Allergies Allergies  Allergen Reactions   Amoxicillin Swelling   Keflex [Cephalexin] Rash       DVT/GI PRX  assessed I Assessed the need for Labs I Assessed the need for Foley I Assessed the need for Central Venous Line Family Discussion when available I Assessed the need for Mobilization I made an Assessment of medications to be adjusted accordingly Safety Risk assessment completed  CASE DISCUSSED IN MULTIDISCIPLINARY ROUNDS WITH ICU TEAM     Critical Care Time devoted to patient care services described in this note is 55 minutes.  Critical care was necessary to treat or prevent imminent or life-threatening deterioration.   PATIENT WITH VERY POOR PROGNOSIS I ANTICIPATE PROLONGED ICU LOS  Patient with Multiorgan failure and at high risk for cardiac arrest and death.    Corrin Parker, M.D.  [facility]  Medical Director [place] Director [facility] [place] Cardio-Pulmonary Department

## 2020-12-27 NOTE — Plan of Care (Signed)
  Problem: Clinical Measurements: Goal: Ability to maintain clinical measurements within normal limits will improve Outcome: Progressing Goal: Will remain free from infection Outcome: Progressing Goal: Diagnostic test results will improve Outcome: Progressing Goal: Cardiovascular complication will be avoided Outcome: Progressing   Problem: Nutrition: Goal: Adequate nutrition will be maintained Outcome: Progressing   Problem: Clinical Measurements: Goal: Respiratory complications will improve Outcome: Not Progressing Note: Continues to require vent support.   Problem: Safety: Goal: Violent Restraint(s) Outcome: Completed/Met

## 2020-12-28 DIAGNOSIS — Z9911 Dependence on respirator [ventilator] status: Secondary | ICD-10-CM

## 2020-12-28 DIAGNOSIS — I4891 Unspecified atrial fibrillation: Secondary | ICD-10-CM

## 2020-12-28 DIAGNOSIS — I214 Non-ST elevation (NSTEMI) myocardial infarction: Secondary | ICD-10-CM | POA: Diagnosis not present

## 2020-12-28 DIAGNOSIS — R410 Disorientation, unspecified: Secondary | ICD-10-CM

## 2020-12-28 DIAGNOSIS — Z720 Tobacco use: Secondary | ICD-10-CM

## 2020-12-28 DIAGNOSIS — I2699 Other pulmonary embolism without acute cor pulmonale: Secondary | ICD-10-CM | POA: Diagnosis not present

## 2020-12-28 DIAGNOSIS — E119 Type 2 diabetes mellitus without complications: Secondary | ICD-10-CM

## 2020-12-28 DIAGNOSIS — J9601 Acute respiratory failure with hypoxia: Secondary | ICD-10-CM | POA: Diagnosis not present

## 2020-12-28 LAB — GLUCOSE, CAPILLARY
Glucose-Capillary: 191 mg/dL — ABNORMAL HIGH (ref 70–99)
Glucose-Capillary: 200 mg/dL — ABNORMAL HIGH (ref 70–99)
Glucose-Capillary: 227 mg/dL — ABNORMAL HIGH (ref 70–99)
Glucose-Capillary: 189 mg/dL — ABNORMAL HIGH (ref 70–99)
Glucose-Capillary: 198 mg/dL — ABNORMAL HIGH (ref 70–99)

## 2020-12-28 LAB — CBC
HCT: 35.4 % — ABNORMAL LOW (ref 39.0–52.0)
Hemoglobin: 11.3 g/dL — ABNORMAL LOW (ref 13.0–17.0)
MCH: 31.8 pg (ref 26.0–34.0)
MCHC: 31.9 g/dL (ref 30.0–36.0)
MCV: 99.7 fL (ref 80.0–100.0)
Platelets: 182 10*3/uL (ref 150–400)
RBC: 3.55 MIL/uL — ABNORMAL LOW (ref 4.22–5.81)
RDW: 13.7 % (ref 11.5–15.5)
WBC: 6.9 10*3/uL (ref 4.0–10.5)
nRBC: 0 % (ref 0.0–0.2)

## 2020-12-28 LAB — BASIC METABOLIC PANEL
Anion gap: 6 (ref 5–15)
Anion gap: 5 (ref 5–15)
BUN: 24 mg/dL — ABNORMAL HIGH (ref 8–23)
BUN: 29 mg/dL — ABNORMAL HIGH (ref 8–23)
CO2: 26 mmol/L (ref 22–32)
CO2: 26 mmol/L (ref 22–32)
Calcium: 8.8 mg/dL — ABNORMAL LOW (ref 8.9–10.3)
Calcium: 8.7 mg/dL — ABNORMAL LOW (ref 8.9–10.3)
Chloride: 108 mmol/L (ref 98–111)
Chloride: 107 mmol/L (ref 98–111)
Creatinine, Ser: 1.36 mg/dL — ABNORMAL HIGH (ref 0.61–1.24)
Creatinine, Ser: 1.42 mg/dL — ABNORMAL HIGH (ref 0.61–1.24)
GFR, Estimated: 58 mL/min — ABNORMAL LOW (ref >60)
GFR, Estimated: 56 mL/min — ABNORMAL LOW (ref >60)
Glucose, Bld: 207 mg/dL — ABNORMAL HIGH (ref 70–99)
Glucose, Bld: 301 mg/dL — ABNORMAL HIGH (ref 70–99)
Potassium: 4.9 mmol/L (ref 3.5–5.1)
Potassium: 4.7 mmol/L (ref 3.5–5.1)
Sodium: 140 mmol/L (ref 135–145)
Sodium: 138 mmol/L (ref 135–145)

## 2020-12-28 LAB — CULTURE, RESPIRATORY W GRAM STAIN: Culture: NORMAL

## 2020-12-28 LAB — PROCALCITONIN: Procalcitonin: <0.1 ng/mL

## 2020-12-28 LAB — HEPARIN LEVEL (UNFRACTIONATED)
Heparin Unfractionated: <0.1 IU/mL — ABNORMAL LOW (ref 0.30–0.70)
Heparin Unfractionated: <0.1 IU/mL — ABNORMAL LOW (ref 0.30–0.70)

## 2020-12-28 LAB — PHOSPHORUS: Phosphorus: 3.6 mg/dL (ref 2.5–4.6)

## 2020-12-28 MED ORDER — SODIUM CHLORIDE 0.9 % IV SOLN: INTRAVENOUS | Status: DC

## 2020-12-28 MED ORDER — AMIODARONE HCL IN DEXTROSE 360-4.14 MG/200ML-% IV SOLN
30.0000 mg/h | INTRAVENOUS | Status: AC
  Administered 2020-12-28 – 2020-12-29 (×2): 30 mg/h via INTRAVENOUS
  Filled 2020-12-28 (×2): qty 200

## 2020-12-28 MED ORDER — AMIODARONE LOAD VIA INFUSION
150.0000 mg | Freq: Once | INTRAVENOUS | Status: AC
  Administered 2020-12-28: 150 mg via INTRAVENOUS
  Filled 2020-12-28: qty 83.34

## 2020-12-28 MED ORDER — HEPARIN BOLUS VIA INFUSION
3000.0000 [IU] | Freq: Once | INTRAVENOUS | Status: AC
  Administered 2020-12-28: 3000 [IU] via INTRAVENOUS
  Filled 2020-12-28: qty 3000

## 2020-12-28 MED ORDER — AMIODARONE HCL IN DEXTROSE 360-4.14 MG/200ML-% IV SOLN
60.0000 mg/h | INTRAVENOUS | Status: AC
  Administered 2020-12-28 (×2): 60 mg/h via INTRAVENOUS
  Filled 2020-12-28 (×2): qty 200

## 2020-12-28 MED ORDER — HEPARIN BOLUS VIA INFUSION
1500.0000 [IU] | Freq: Once | INTRAVENOUS | Status: AC
  Administered 2020-12-28: 1500 [IU] via INTRAVENOUS
  Filled 2020-12-28: qty 1500

## 2020-12-28 MED ORDER — SODIUM CHLORIDE 0.9 % IV BOLUS
500.0000 mL | Freq: Once | INTRAVENOUS | Status: AC
  Administered 2020-12-28: 500 mL via INTRAVENOUS

## 2020-12-28 NOTE — Consult Note (Signed)
PHARMACY CONSULT NOTE  Pharmacy Consult for Electrolyte Monitoring and Replacement   Recent Labs: Potassium (mmol/L)  Date Value  12/28/2020 4.9  10/03/2011 3.8   Magnesium (mg/dL)  Date Value  42/59/5638 2.2   Calcium (mg/dL)  Date Value  75/64/3329 8.8 (L)   Calcium, Total (mg/dL)  Date Value  51/88/4166 8.7   Albumin (g/dL)  Date Value  12/12/1599 4.1  10/02/2011 3.6   Phosphorus (mg/dL)  Date Value  09/32/3557 3.6   Sodium (mmol/L)  Date Value  12/28/2020 140  10/03/2011 146 (H)   Assessment: Patient is a 65 y/o M with medical history including polysubstance abuse, diabetes, HTN , CAD s/p CABG, Afib who is admitted with acute bilateral PE. He is s/p thrombectomy. Patient with acute metabolic encephalopathy requiring intubation 7/14. He is currently intubated, sedated, and on mechanical ventilation in the ICU. Pharmacy consulted to assist with electrolyte monitoring and replacement as indicated.  Scr trending down (1.04 >> 2.48 >> 1.52)  Tube feeds started 7/15. Patient is at risk for re-feeding  Goal of Therapy:  K > 4 Mg > 2 All other electrolytes within normal limits  Plan:  K 4.9  Phos 3.6  Scr 1.36 --Hyperkalemia- was resolving with improving renal function but K bumped back up today-watch --No electrolyte replacement warranted at this time --Follow-up electrolytes with AM labs tomorrow  Sonnia Strong A 12/28/2020 7:50 AM

## 2020-12-28 NOTE — Progress Notes (Signed)
ANTICOAGULATION CONSULT NOTE  Pharmacy Consult for heparin Indication: pulmonary embolus  Patient Measurements: Heparin Dosing Weight: 101 kg  Labs: Recent Labs    12/26/20 0832 12/26/20 1628 12/26/20 2103 12/26/20 2259 12/27/20 0502 12/28/20 0420 12/28/20 1415 12/28/20 1518  HGB 13.1  --   --   --  11.5* 11.3*  --   --   HCT 39.7  --   --   --  35.4* 35.4*  --   --   PLT 210  --   --   --  182 182  --   --   HEPARINUNFRC 0.76*   < >  --    < > 0.38 <0.10* <0.10*  --   CREATININE 2.48*   < >  --   --  1.52* 1.36*  --  1.42*  CKMB  --   --  3.0  --   --   --   --   --    < > = values in this interval not displayed.     Estimated Creatinine Clearance: 68.1 mL/min (A) (by C-G formula based on SCr of 1.42 mg/dL (H)).   Medical History: Past Medical History:  Diagnosis Date   Coronary artery disease    Diabetes mellitus without complication (HCC)    Hypertension    S/P CABG x 1     Assessment: 65 year old male w/ h/o CAD (s/p CABG), HTN, IIDM, came with new onset of palpitations and SOB. Elevation in troponin concerning for NSTEMI, w/ further w/u revealing acute Bil PE. Pharmacy consulted for mgmt of heparin drip.  7/11: CTA positive for acute moderate clot burden bilateral pulmonary emboli no saddle embolus.  Mild to moderate cardiomegaly.  No signs of right heart strain.  Normal RV/LV ratio.  Goal of Therapy:  Heparin level 0.3-0.7 units/ml Monitor platelets by anticoagulation protocol: Yes  Date Time aPTT/HL Rate/Comment 07/12 2257     0.37  Thera x1 07/12 0653     0.35  Thera x2 07/13 0656     0.25  Subthera 07/13 2232     0.10  Subthera 07/14 0808     0.47  Thera x 1 07/14   1406     0.65  Thera x 2 07/15 0832     0.76   Suprathera 07/15 1628     0.59  Therapeutic  07/15 2259     0.51  Therapeutic x 2 07/16 0502     0.38  Therapeutic      07/17  0420        <0.10 Subthera 07/17 1415     <0.10 Subthera  Baseline Labs: aPTT - 30s INR - 1.0 Hgb -  15.4>14.6>13.9>13.1 Plts - 252>253>178>210  Plan:  Heparin level is subtherapeutic again. Per nurse, will change heparin infusion to different line in case faulty line Will order 3000 unit bolus x 1 and increase heparin infusion to 1850 units/hr Re-check HL 6 hrs after rate change CBC daily while on heparin infusion  Sharen Hones, PharmD, BCPS Clinical Pharmacist   12/28/2020

## 2020-12-28 NOTE — Progress Notes (Signed)
NAME:  Jeremy [name], MRN:  [id number], DOB:  [birth year], LOS: 6 ADMISSION DATE:  12/22/2020  BRIEF SYNOPSIS 65 year old male with significant past medical history of CAD s/p CABG, HTN, llDM, polysubstance abuse, and MRSA infection who presented to the ED on 12/23/2018 with chief complaints of shortness of breath and palpitation x1 week   ED Course: On arrival to the ED, he was afebrile with blood pressure (!) 131/1105m Hg and pulse rate (!) 128 beats/min, RR (!) 26. Sats 96% on RA. There were no focal neurological deficits; he was alert and oriented x4, and he did not demonstrate any memory deficits. Labs/Diagnostics BMP shows no significant electrolyte or metabolic derangements.  CBC shows no leukocytosis or acute anemia.  COVID and influenza test is negative EKG: Sinus tachycardia with a ventricular of 127, normal axis, unremarkable intervals with some T wave inversions nonspecific changes in lateral leads and some nonspecific changes in anterior leads without other clear evidence of acute ischemia. Troponin: 74 CXR: Mild congestive heart failure pulm edema without focal consolidation, effusion or pneumothorax. CTA Chest: acute moderate clot burden bilateral pulmonary emboli no saddle embolus.  Mild to moderate cardiomegaly.  No signs of right heart strain.  Normal RV/LV ratio Other Labs D-Dimer elevated at 2.9. Given positive finding of bilateral pulmonary embolus Szymon as above and elevated troponin concerning for NSTEMI, patient was started on heparin drip and admitted under hospitalist service for further management   Hospital Course: During the course of his admission patient remained visibly short of breath, tachypneic and tachycardic at rest.  Vascular surgeon was consulted who deemed him appropriate for pulmonary thrombectomy.  Patient underwent pulmonary thrombectomy on 12/24/2020.  Per nursing staff, following the thrombectomy procedure patient became agitated and irritable trying  to leave AMA.  Security was called and patient subsequently received 0.5 mg IV Ativan, 5 mg IM Haldol, 5 mg IM Valium, 2 mg Versed, Zyprexa 10 mg IM with no effect.  He remained combative, violently agitated requiring multiple security at the bedside.  Patient was transferred to ICU for Precedex drip and PCCM consulted.   Past Medical History  CAD s/p CABG, HTN, llDM, polysubstance abuse, and MRSA infection   Significant Hospital Events  7/11: Admitted to PCU with bilateral pulmonary embolism 7/13: S/p pulmonary thrombectomy 7/13: Patient transferred to ICU for severe agitation requiring Precedex drip.  PCCM consulted 12/25/20- patient was combative and fell out of bed with confusion and hallucinations, accusing staff of shooting him in abdomen and has labored breathing, elevated risk of cardiopulmonary arrest due to drug/alcohol withdrawal,  unable to protect airway 7/14 intubated,sedated, severe resp failure 7/15 remains intubated, severe resp failure, severe encephalopathy 7/16 failure to wean from vent, resp distress and encephlopathy   Consults:  PCCM Vascular Cardiology Procedures:  7/13: S/p pulmonary thrombectomy   Significant Diagnostic Tests:  7/11: Chest Xray>Mild congestive heart failure pulm edema without focal consolidation, effusion or pneumothorax. 7/11: CTA Chest>acute moderate clot burden bilateral pulmonary emboli no saddle embolus.  Mild to moderate cardiomegaly.  No signs of right heart strain.  Normal RV/LV ratio 7/13: Noncontrast CT head> Micro Data:  7/11: SARS-CoV-2 PCR>> negative 7/11: Influenza PCR>> negative      Interim History / Subjective:  +COCAINE POISONING SEVERE RESP FAILURE +hypoxia On vent Remains critically ill       Objective   Blood pressure 103/69, pulse (!) 122, temperature 99.86 F (37.7 C), temperature source Esophageal, resp. rate 15, height '5\' 11"'  (1.803 m), weight 113 kg, SpO2 96 %.  Vent Mode: PRVC FiO2 (%):  [35 %] 35  % Set Rate:  [16 bmp] 16 bmp Vt Set:  [500 mL] 500 mL PEEP:  [8 cmH20] 8 cmH20 Plateau Pressure:  [14 cmH20-18 cmH20] 14 cmH20   Intake/Output Summary (Last 24 hours) at 12/28/2020 0759 Last data filed at 12/28/2020 0729 Gross per 24 hour  Intake 3085.62 ml  Output 1815 ml  Net 1270.62 ml    Filed Weights   12/22/20 1353 12/27/20 0500 12/28/20 0500  Weight: 117.9 kg 113.9 kg 113 kg   REVIEW OF SYSTEMS  PATIENT IS UNABLE TO PROVIDE COMPLETE REVIEW OF SYSTEMS DUE TO SEVERE CRITICAL ILLNESS AND TOXIC METABOLIC ENCEPHALOPATHY    PHYSICAL EXAMINATION:  GENERAL:critically ill appearing, +resp distress HEAD: Normocephalic, atraumatic.  EYES: Pupils equal, round, reactive to light.  No scleral icterus.  MOUTH: Moist mucosal membrane. NECK: Supple.  PULMONARY: +rhonchi, +wheezing CARDIOVASCULAR: S1 and S2. Regular rate and rhythm. No murmurs, rubs, or gallops.  GASTROINTESTINAL: Soft, nontender, -distended. Positive bowel sounds.  MUSCULOSKELETAL: No swelling, clubbing, or edema.  NEUROLOGIC: obtunded SKIN:intact,warm,dry    Labs/imaging that I havepersonally reviewed  (right click and "Reselect all SmartList Selections" daily)      ASSESSMENT AND PLAN SYNOPSIS  65 yo Morbidly obese AAM with b/l PE with embolectomy with progressive hypoxia and severe toxic metabolic encephalopathy with severe COCAINE POISONING  Severe ACUTE Hypoxic and Hypercapnic Respiratory Failure -continue Mechanical Ventilator support -continue Bronchodilator Therapy -Wean Fio2 and PEEP as tolerated -VAP/VENT bundle implementation -will perform SAT/SBT when respiratory parameters are met Failure to wean from vent last several days  Vent Mode: PRVC FiO2 (%):  [35 %] 35 % Set Rate:  [16 bmp] 16 bmp Vt Set:  [500 mL] 500 mL PEEP:  [8 cmH20] 8 cmH20 Plateau Pressure:  [14 cmH20-18 cmH20] 14 cmH20  SEVERE COPD EXACERBATION -continue IV steroids as prescribed -continue NEB THERAPY as  prescribed -morphine as needed -wean fio2 as needed and tolerated    CARDIAC FAILURE- with intermittent AFIB B/L PE with  acute cor pulmonale and diastolic heart failure Start AMIO Continue AC -oxygen as needed -Lasix as tolerated  SEVERE DRUG WITHDRAWAL -Therapy with Thiamine and MVI Sedation as needed   CARDIAC ICU monitoring   ACUTE KIDNEY INJURY/Renal Failure -continue Foley Catheter-assess need -Avoid nephrotoxic agents -Follow urine output, BMP -Ensure adequate renal perfusion, optimize oxygenation -Renal dose medications   Intake/Output Summary (Last 24 hours) at 12/28/2020 0759 Last data filed at 12/28/2020 0762 Gross per 24 hour  Intake 3085.62 ml  Output 1815 ml  Net 1270.62 ml      NEUROLOGY Acute toxic metabolic encephalopathy MRI brain NEG Wean sedation and assess NEURO Status +Progressive DELIRIUM Started high dose Thiamine    GI GI PROPHYLAXIS as indicated  NUTRITIONAL STATUS DIET-->TF's as tolerated Constipation protocol as indicated  ELECTROLYTES -follow labs as needed -replace as needed -pharmacy consultation and following    Best practice:  Diet:  NPO Pain/Anxiety/Delirium protocol (if indicated): Yes (RASS goal -1) VAP protocol (if indicated): Not indicated DVT prophylaxis: Subcutaneous Heparin GI prophylaxis: PPI Glucose control:  SSI Yes Central venous access:  N/A Arterial line:  N/A Foley:  N/A Mobility:  bed rest  PT consulted: N/A Last date of multidisciplinary goals of care discussion [7/13] Code Status:  full code Disposition:ICU    Labs   CBC: Recent Labs  Lab 12/24/20 0656 12/25/20 0808 12/26/20 0832 12/27/20 0502 12/28/20 0420  WBC 6.4 6.0 10.8* 7.9 6.9  NEUTROABS  --  3.2  --   --   --   HGB 14.6 13.9 13.1 11.5* 11.3*  HCT 41.9 42.4 39.7 35.4* 35.4*  MCV 93.1 95.7 96.6 97.8 99.7  PLT 241 178 210 182 182     Basic Metabolic Panel: Recent Labs  Lab 12/23/20 1521 12/24/20 0656  12/24/20 2232 12/25/20 0808 12/25/20 1807 12/26/20 0832 12/26/20 1628 12/26/20 2259 12/27/20 0502 12/28/20 0420  NA  --    < > 140   < > 141 141 137  --  141 140  K  --    < > 4.0   < > 5.1 5.6* 4.0  --  4.4 4.9  CL  --    < > 111   < > 112* 111 107  --  110 108  CO2  --    < > 23   < > 20* 20* 22  --  26 26  GLUCOSE  --    < > 136*   < > 184* 139* 284*  --  162* 207*  BUN  --    < > 20   < > 25* 30* 28*  --  24* 24*  CREATININE  --    < > 1.26*   < > 1.65* 2.48* 2.02*  --  1.52* 1.36*  CALCIUM  --    < > 9.1   < > 9.0 8.4* 8.0*  --  8.4* 8.8*  MG 2.0  --  2.2  --   --   --   --  2.0 2.2  --   PHOS 3.7  --   --   --   --   --   --  4.9* 4.5 3.6   < > = values in this interval not displayed.    GFR: Estimated Creatinine Clearance: 71.1 mL/min (A) (by C-G formula based on SCr of 1.36 mg/dL (H)). Recent Labs  Lab 12/22/20 1421 12/23/20 0653 12/25/20 0808 12/26/20 0832 12/27/20 0502 12/28/20 0420  PROCALCITON <0.10  --   --   --   --   --   WBC 6.6   < > 6.0 10.8* 7.9 6.9   < > = values in this interval not displayed.     Liver Function Tests: No results for input(s): AST, ALT, ALKPHOS, BILITOT, PROT, ALBUMIN in the last 168 hours. No results for input(s): LIPASE, AMYLASE in the last 168 hours. No results for input(s): AMMONIA in the last 168 hours.  ABG    Component Value Date/Time   PHART 7.30 (L) 12/25/2020 1745   PCO2ART 40 12/25/2020 1745   PO2ART 82 (L) 12/25/2020 1745   HCO3 19.7 (L) 12/25/2020 1745   ACIDBASEDEF 6.3 (H) 12/25/2020 1745   O2SAT 94.7 12/25/2020 1745     Coagulation Profile: Recent Labs  Lab 12/22/20 1500  INR 1.0     Cardiac Enzymes: Recent Labs  Lab 12/26/20 2103  CKMB 3.0     HbA1C: Hemoglobin A1C  Date/Time Value Ref Range Status  10/03/2011 05:33 AM 6.2 4.2 - 6.3 % Final    Comment:    The American Diabetes Association recommends that a primary goal of therapy should be <7% and that physicians should reevaluate  the treatment regimen in patients with HbA1c values consistently >8%.    Hgb A1c MFr Bld  Date/Time Value Ref Range Status  12/22/2020 10:57 PM 8.7 (H) 4.8 - 5.6 % Final    Comment:    (NOTE) Pre diabetes:  5.7%-6.4%  Diabetes:              >6.4%  Glycemic control for   <7.0% adults with diabetes   08/20/2019 02:09 PM 13.5 (H) 4.8 - 5.6 % Final    Comment:    (NOTE) Pre diabetes:          5.7%-6.4% Diabetes:              >6.4% Glycemic control for   <7.0% adults with diabetes     CBG: Recent Labs  Lab 12/27/20 1113 12/27/20 1521 12/27/20 1935 12/27/20 2329 12/28/20 0401  GLUCAP 187* 182* 158* 163* 191*     Allergies Allergies  Allergen Reactions   Amoxicillin Swelling   Keflex [Cephalexin] Rash        DVT/GI PRX  assessed I Assessed the need for Labs I Assessed the need for Foley I Assessed the need for Central Venous Line Family Discussion when available I Assessed the need for Mobilization I made an Assessment of medications to be adjusted accordingly Safety Risk assessment completed  CASE DISCUSSED IN MULTIDISCIPLINARY ROUNDS WITH ICU TEAM     Critical Care Time devoted to patient care services described in this note is 55 minutes.  Critical care was necessary to treat /prevent imminent and life-threatening deterioration. Overall, patient is critically ill, prognosis is guarded.  Patient with Multiorgan failure and at high risk for cardiac arrest and death.   PATIENT WITH VERY POOR PROGNOSIS I ANTICIPATE PROLONGED ICU LOS  Corrin Parker, M.D.  [facility]  Medical Director [place] Director [facility] Cardio-Pulmonary Department

## 2020-12-28 NOTE — Progress Notes (Signed)
ANTICOAGULATION CONSULT NOTE  Pharmacy Consult for heparin Indication: pulmonary embolus  Patient Measurements: Heparin Dosing Weight: 101 kg  Labs: Recent Labs    12/26/20 0832 12/26/20 1628 12/26/20 2103 12/26/20 2259 12/27/20 0502 12/28/20 0420  HGB 13.1  --   --   --  11.5* 11.3*  HCT 39.7  --   --   --  35.4* 35.4*  PLT 210  --   --   --  182 182  HEPARINUNFRC 0.76* 0.59  --  0.51 0.38 <0.10*  CREATININE 2.48* 2.02*  --   --  1.52* 1.36*  CKMB  --   --  3.0  --   --   --      Estimated Creatinine Clearance: 71.1 mL/min (A) (by C-G formula based on SCr of 1.36 mg/dL (H)).   Medical History: Past Medical History:  Diagnosis Date   Coronary artery disease    Diabetes mellitus without complication (HCC)    Hypertension    S/P CABG x 1     Assessment: 65 year old male w/ h/o CAD (s/p CABG), HTN, IIDM, came with new onset of palpitations and SOB. Elevation in troponin concerning for NSTEMI, w/ further w/u revealing acute Bil PE. Pharmacy consulted for mgmt of heparin drip.  7/11: CTA positive for acute moderate clot burden bilateral pulmonary emboli no saddle embolus.  Mild to moderate cardiomegaly.  No signs of right heart strain.  Normal RV/LV ratio.  Goal of Therapy:  Heparin level 0.3-0.7 units/ml Monitor platelets by anticoagulation protocol: Yes  Date Time aPTT/HL Rate/Comment 07/12 2257     0.37  Thera x1 07/12 0653     0.35  Thera x2 07/13 0656     0.25  Subthera 07/13 2232     0.10  Subthera 07/14 0808     0.47  Thera x 1 07/14   1406     0.65  Thera x 2 07/15 0832     0.76   Suprathera 07/15 1628     0.59  Therapeutic  07/15 2259     0.51  Therapeutic x 2 07/16 0502     0.38  Therapeutic      07/17  0420        <0.10 Subthera  Baseline Labs: aPTT - 30s INR - 1.0 Hgb - 15.4>14.6>13.9>13.1 Plts - 252>253>178>210  Plan:  Heparin level is subtherapeutic. It appears Heparin drip was paused at time of lab draw Will order 1500 unit bolus x 1 and  increase heparin infusion to 1550 units/hr Re-check HL 6 hrs after rate change CBC daily while on heparin infusion  Bari Mantis PharmD Clinical Pharmacist 12/28/2020

## 2020-12-29 ENCOUNTER — Inpatient Hospital Stay: Payer: Medicaid Other

## 2020-12-29 DIAGNOSIS — N179 Acute kidney failure, unspecified: Secondary | ICD-10-CM | POA: Diagnosis not present

## 2020-12-29 DIAGNOSIS — I4891 Unspecified atrial fibrillation: Secondary | ICD-10-CM | POA: Diagnosis not present

## 2020-12-29 DIAGNOSIS — I2699 Other pulmonary embolism without acute cor pulmonale: Secondary | ICD-10-CM | POA: Diagnosis not present

## 2020-12-29 DIAGNOSIS — J9601 Acute respiratory failure with hypoxia: Secondary | ICD-10-CM | POA: Diagnosis not present

## 2020-12-29 LAB — FOLATE: Folate: >100 ng/mL (ref >5.9)

## 2020-12-29 LAB — GLUCOSE, CAPILLARY
Glucose-Capillary: 176 mg/dL — ABNORMAL HIGH (ref 70–99)
Glucose-Capillary: 180 mg/dL — ABNORMAL HIGH (ref 70–99)
Glucose-Capillary: 188 mg/dL — ABNORMAL HIGH (ref 70–99)
Glucose-Capillary: 193 mg/dL — ABNORMAL HIGH (ref 70–99)
Glucose-Capillary: 202 mg/dL — ABNORMAL HIGH (ref 70–99)
Glucose-Capillary: 234 mg/dL — ABNORMAL HIGH (ref 70–99)

## 2020-12-29 LAB — CBC
HCT: 32.9 % — ABNORMAL LOW (ref 39.0–52.0)
Hemoglobin: 10.5 g/dL — ABNORMAL LOW (ref 13.0–17.0)
MCH: 31.8 pg (ref 26.0–34.0)
MCHC: 31.9 g/dL (ref 30.0–36.0)
MCV: 99.7 fL (ref 80.0–100.0)
Platelets: 165 10*3/uL (ref 150–400)
RBC: 3.3 MIL/uL — ABNORMAL LOW (ref 4.22–5.81)
RDW: 13.8 % (ref 11.5–15.5)
WBC: 7.6 10*3/uL (ref 4.0–10.5)
nRBC: 0 % (ref 0.0–0.2)

## 2020-12-29 LAB — BASIC METABOLIC PANEL
Anion gap: 3 — ABNORMAL LOW (ref 5–15)
BUN: 35 mg/dL — ABNORMAL HIGH (ref 8–23)
CO2: 26 mmol/L (ref 22–32)
Calcium: 8.9 mg/dL (ref 8.9–10.3)
Chloride: 110 mmol/L (ref 98–111)
Creatinine, Ser: 1.36 mg/dL — ABNORMAL HIGH (ref 0.61–1.24)
GFR, Estimated: 58 mL/min — ABNORMAL LOW (ref >60)
Glucose, Bld: 209 mg/dL — ABNORMAL HIGH (ref 70–99)
Potassium: 4.8 mmol/L (ref 3.5–5.1)
Sodium: 139 mmol/L (ref 135–145)

## 2020-12-29 LAB — HEPARIN LEVEL (UNFRACTIONATED)
Heparin Unfractionated: 0.59 IU/mL (ref 0.30–0.70)
Heparin Unfractionated: 0.5 IU/mL (ref 0.30–0.70)

## 2020-12-29 LAB — PHOSPHORUS: Phosphorus: 3.5 mg/dL (ref 2.5–4.6)

## 2020-12-29 LAB — MAGNESIUM: Magnesium: 2.1 mg/dL (ref 1.7–2.4)

## 2020-12-29 LAB — PROCALCITONIN: Procalcitonin: <0.1 ng/mL

## 2020-12-29 MED ORDER — PIPERACILLIN-TAZOBACTAM 3.375 G IVPB
3.3750 g | Freq: Three times a day (TID) | INTRAVENOUS | Status: DC
  Administered 2020-12-29 – 2020-12-30 (×3): 3.375 g via INTRAVENOUS
  Filled 2020-12-29 (×3): qty 50

## 2020-12-29 MED ORDER — FUROSEMIDE 10 MG/ML IJ SOLN
40.0000 mg | Freq: Once | INTRAMUSCULAR | Status: AC
  Administered 2020-12-29: 40 mg via INTRAVENOUS
  Filled 2020-12-29: qty 4

## 2020-12-29 MED ORDER — INSULIN ASPART 100 UNIT/ML IJ SOLN
0.0000 [IU] | INTRAMUSCULAR | Status: DC
  Administered 2020-12-29 – 2020-12-30 (×4): 4 [IU] via SUBCUTANEOUS
  Administered 2020-12-30: 7 [IU] via SUBCUTANEOUS
  Administered 2020-12-30: 4 [IU] via SUBCUTANEOUS
  Administered 2020-12-31: 3 [IU] via SUBCUTANEOUS
  Administered 2020-12-31: 4 [IU] via SUBCUTANEOUS
  Administered 2020-12-31: 3 [IU] via SUBCUTANEOUS
  Administered 2020-12-31 (×3): 4 [IU] via SUBCUTANEOUS
  Administered 2021-01-01 (×2): 3 [IU] via SUBCUTANEOUS
  Administered 2021-01-01: 4 [IU] via SUBCUTANEOUS
  Filled 2020-12-29 (×16): qty 1

## 2020-12-29 MED ORDER — IPRATROPIUM-ALBUTEROL 0.5-2.5 (3) MG/3ML IN SOLN
3.0000 mL | Freq: Three times a day (TID) | RESPIRATORY_TRACT | Status: DC
  Administered 2020-12-29 – 2020-12-30 (×4): 3 mL via RESPIRATORY_TRACT
  Filled 2020-12-29 (×4): qty 3

## 2020-12-29 MED ORDER — METOPROLOL TARTRATE 25 MG/10 ML ORAL SUSPENSION
12.5000 mg | Freq: Four times a day (QID) | ORAL | Status: DC
  Administered 2020-12-29 – 2020-12-30 (×4): 12.5 mg
  Filled 2020-12-29 (×7): qty 5

## 2020-12-29 NOTE — Progress Notes (Signed)
ANTICOAGULATION CONSULT NOTE  Pharmacy Consult for heparin Indication: pulmonary embolus  Patient Measurements: Heparin Dosing Weight: 101 kg  Labs: Recent Labs    12/26/20 2103 12/26/20 2259 12/27/20 0502 12/28/20 0420 12/28/20 1415 12/28/20 1518 12/29/20 0220 12/29/20 0430 12/29/20 0858  HGB  --    < > 11.5* 11.3*  --   --   --  10.5*  --   HCT  --   --  35.4* 35.4*  --   --   --  32.9*  --   PLT  --   --  182 182  --   --   --  165  --   HEPARINUNFRC  --    < > 0.38 <0.10* <0.10*  --  0.59  --  0.50  CREATININE  --   --  1.52* 1.36*  --  1.42*  --  1.36*  --   CKMB 3.0  --   --   --   --   --   --   --   --    < > = values in this interval not displayed.     Estimated Creatinine Clearance: 72.2 mL/min (A) (by C-G formula based on SCr of 1.36 mg/dL (H)).   Medical History: Past Medical History:  Diagnosis Date   Coronary artery disease    Diabetes mellitus without complication (HCC)    Hypertension    S/P CABG x 1     Assessment: 65 year old male w/ h/o CAD (s/p CABG), HTN, IIDM, came with new onset of palpitations and SOB. Elevation in troponin concerning for NSTEMI, w/ further w/u revealing acute Bil PE. Pharmacy consulted for mgmt of heparin drip.  7/11: CTA positive for acute moderate clot burden bilateral pulmonary emboli no saddle embolus.  Mild to moderate cardiomegaly.  No signs of right heart strain.  Normal RV/LV ratio.  Goal of Therapy:  Heparin level 0.3-0.7 units/ml Monitor platelets by anticoagulation protocol: Yes  Date Time aPTT/HL Rate/Comment 07/12 2257     0.37  Thera x1 07/12 0653     0.35  Thera x2 07/13 0656     0.25  Subthera 07/13 2232     0.10  Subthera 07/14 0808     0.47  Thera x 1 07/14   1406     0.65  Thera x 2 07/15 0832     0.76   Suprathera 07/15 1628     0.59  Therapeutic  07/15 2259     0.51  Therapeutic x 2 07/16 0502     0.38  Therapeutic      07/17  0420        <0.10 Subthera 07/17 1415      <0.10 Subthera 07/18 0220     0.59  Therapeutic x 1 07/18 0858     0.50  Therapeutic x 2   Plan:  HL therapeutic Continue heparin infusion at 1850 units/hr HL and CBC daily while on heparin infusion  Laureen Ochs, PharmD, BCPS 12/29/2020 10:42 AM

## 2020-12-29 NOTE — Progress Notes (Signed)
NAME:  Jeremy [name], MRN:  [id number], DOB:  10-18-1955, LOS: 7 ADMISSION DATE:  12/22/2020  BRIEF SYNOPSIS 65 y.o. male with significant past medical history of CAD s/p CABG, HTN, llDM, polysubstance abuse, and MRSA infection who presented to the ED on 12/23/2018 with chief complaints of shortness of breath and palpitation x1 week   ED Course: On arrival to the ED, he was afebrile with blood pressure (!) 131/128m Hg and pulse rate (!) 128 beats/min, RR (!) 26. Sats 96% on RA. There were no focal neurological deficits; he was alert and oriented x4, and he did not demonstrate any memory deficits. Labs/Diagnostics BMP shows no significant electrolyte or metabolic derangements.  CBC shows no leukocytosis or acute anemia.  COVID and influenza test is negative EKG: Sinus tachycardia with a ventricular of 127, normal axis, unremarkable intervals with some T wave inversions nonspecific changes in lateral leads and some nonspecific changes in anterior leads without other clear evidence of acute ischemia. Troponin: 74 CXR: Mild congestive heart failure pulm edema without focal consolidation, effusion or pneumothorax. CTA Chest: acute moderate clot burden bilateral pulmonary emboli no saddle embolus.  Mild to moderate cardiomegaly.  No signs of right heart strain.  Normal RV/LV ratio Other Labs D-Dimer elevated at 2.9. Given positive finding of bilateral pulmonary embolus as above and elevated troponin concerning for NSTEMI, patient was started on heparin drip and admitted under hospitalist service for further management   Hospital Course: During the course of his admission patient remained visibly short of breath, tachypneic and tachycardic at rest.  Vascular surgeon was consulted who deemed him appropriate for pulmonary thrombectomy.  Patient underwent pulmonary thrombectomy on 12/24/2020.  Per nursing staff, following the thrombectomy procedure patient became agitated and irritable trying to leave AMA.   Security was called and patient subsequently received 0.5 mg IV Ativan, 5 mg IM Haldol, 5 mg IM Valium, 2 mg Versed, Zyprexa 10 mg IM with no effect.  He remained combative, violently agitated requiring multiple security at the bedside.  Patient was transferred to ICU for Precedex drip and PCCM consulted. He was intubated on 7/14.   Past Medical History  CAD s/p CABG, HTN, llDM, polysubstance abuse, and MRSA infection   Significant Hospital Events  7/11: Admitted to PCU with bilateral pulmonary embolism 7/13: S/p pulmonary thrombectomy 7/13: Patient transferred to ICU for severe agitation requiring Precedex drip.  PCCM consulted 12/25/20- patient was combative and fell out of bed with confusion and hallucinations, accusing staff of shooting him in abdomen and has labored breathing, elevated risk of cardiopulmonary arrest due to drug/alcohol withdrawal,  unable to protect airway 7/14 intubated,sedated, severe resp failure 7/15 remains intubated, severe resp failure, severe encephalopathy 7/16 failure to wean from vent, resp distress and encephlopathy 7/18 remains intubated on minimal vent settings   Consults:  PCCM Vascular Cardiology Procedures:  7/13: S/p pulmonary thrombectomy 7/14: endotracheal intubation   Significant Diagnostic Tests:  7/11: Chest Xray>Mild congestive heart failure pulm edema without focal consolidation, effusion or pneumothorax. 7/11: CTA Chest>acute moderate clot burden bilateral pulmonary emboli no saddle embolus.  Mild to moderate cardiomegaly.  No signs of right heart strain.  Normal RV/LV ratio 7/15: Noncontrast CT head>Unchanged age indeterminate small vessel disease in the cerebral hemispheres, greater on the right. No new intracranial abnormality. 7/15: CT C-spine>No acute osseous abnormality identified in the cervical spine. 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 Micro Data:  7/11: SARS-CoV-2 PCR>>  negative 7/11: Influenza PCR>> negative  Interim History / Subjective:  Remains intubated and sedated. Positive fluid balance last 24 hours. RASS is a -4 this morning on fentanyl + versed. He remains on amio gtt at 0.5 mL/hr, no longer in Afib. Off pressors. Cultures remains NGTD.    Objective   Blood pressure 108/73, pulse (!) 113, temperature 98.2 F (36.8 C), resp. rate 16, height '5\' 11"'  (1.803 m), weight 116.6 kg, SpO2 99 %.    Vent Mode: PRVC FiO2 (%):  [35 %] 35 % Set Rate:  [16 bmp] 16 bmp Vt Set:  [500 mL] 500 mL PEEP:  [8 cmH20] 8 cmH20   Intake/Output Summary (Last 24 hours) at 12/29/2020 0742 Last data filed at 12/29/2020 0932 Gross per 24 hour  Intake 3815.3 ml  Output 1080 ml  Net 2735.3 ml   Filed Weights   12/27/20 0500 12/28/20 0500 12/29/20 0425  Weight: 113.9 kg 113 kg 116.6 kg   REVIEW OF SYSTEMS  PATIENT IS UNABLE TO PROVIDE COMPLETE REVIEW OF SYSTEMS DUE TO SEVERE CRITICAL ILLNESS AND TOXIC METABOLIC ENCEPHALOPATHY    PHYSICAL EXAMINATION:  GENERAL: critically ill appearing, intubated and sedated HEAD: Normocephalic, atraumatic.  EYES: Pupils pinpoint, sluggishly reactive MOUTH: ETT in place PULMONARY: scattered rhonchi, minimal vent settings CARDIOVASCULAR: S1 and S2. Tachycardic No murmurs, rubs, or gallops.  GASTROINTESTINAL: Soft, nontender, -distended. Positive bowel sounds.  MUSCULOSKELETAL: Trace BLE, no clubbing, or edema.  NEUROLOGIC: obtunded SKIN:intact,warm,dry    Labs/imaging that I havepersonally reviewed  (right click and "Reselect all SmartList Selections" daily)  I have reviewed all new relevant labs and imaging.    ASSESSMENT AND PLAN SYNOPSIS  65 yo Morbidly obese AAM with b/l PE with embolectomy with progressive hypoxia and severe toxic metabolic encephalopathy intubated for inability to protect airway and acute hypoxic respiratory failure  Severe ACUTE Hypoxic and Hypercapnic Respiratory Failure -continue  Mechanical Ventilator support -continue Bronchodilator Therapy -Wean Fio2 and PEEP as tolerated -VAP/VENT bundle implementation -will perform SAT/SBT when respiratory parameters are met -goal negative fluid balance -RASS goal 0 to -1 today; wean off Versed  Vent Mode: PRVC FiO2 (%):  [35 %] 35 % Set Rate:  [16 bmp] 16 bmp Vt Set:  [500 mL] 500 mL PEEP:  [8 cmH20] 8 cmH20  CARDIAC FAILURE- with intermittent AFIB B/L PE with acute cor pulmonale and diastolic heart failure Wean off amio gtt today Transition to Lopressor Continue A/C Lasix for goal negative daily fluid balance  SEVERE DRUG WITHDRAWAL -thiamine, folate, MVI -weaning Versed; may require low dose BZ once off, will monitor for withdrawal sxs   CARDIAC ICU monitoring  ACUTE KIDNEY INJURY/Renal Failure -continue Foley Catheter-assess need -Avoid nephrotoxic agents -Follow urine output, BMP -Ensure adequate renal perfusion, optimize oxygenation -Renal dose medications   Intake/Output Summary (Last 24 hours) at 12/29/2020 0742 Last data filed at 12/29/2020 3557 Gross per 24 hour  Intake 3815.3 ml  Output 1080 ml  Net 2735.3 ml     NEUROLOGY Acute toxic metabolic encephalopathy MRI brain NEG Wean sedation and assess NEURO Status +Progressive DELIRIUM Started high dose Thiamine    GI GI PROPHYLAXIS as indicated  NUTRITIONAL STATUS DIET-->TF's as tolerated Constipation protocol as indicated  ELECTROLYTES -follow labs as needed -replace as needed -pharmacy consultation and following    Best practice:  Diet:  continuous TF Pain/Anxiety/Delirium protocol (if indicated): Yes (RASS goal 0 to -1) VAP protocol (if indicated): per protocol DVT prophylaxis: full dose heparin GI prophylaxis: PPI Glucose control:  SSI Yes Central venous access:  N/A  Arterial line:  N/A Foley:  N/A Mobility:  bed rest  PT consulted: N/A Last date of multidisciplinary goals of care discussion [7/13] Code Status:   full code Disposition: ICU    Labs   CBC: Recent Labs  Lab 12/25/20 0808 12/26/20 0832 12/27/20 0502 12/28/20 0420 12/29/20 0430  WBC 6.0 10.8* 7.9 6.9 7.6  NEUTROABS 3.2  --   --   --   --   HGB 13.9 13.1 11.5* 11.3* 10.5*  HCT 42.4 39.7 35.4* 35.4* 32.9*  MCV 95.7 96.6 97.8 99.7 99.7  PLT 178 210 182 182 263    Basic Metabolic Panel: Recent Labs  Lab 12/23/20 1521 12/24/20 0656 12/24/20 2232 12/25/20 0808 12/26/20 1628 12/26/20 2259 12/27/20 0502 12/28/20 0420 12/28/20 1518 12/29/20 0430  NA  --    < > 140   < > 137  --  141 140 138 139  K  --    < > 4.0   < > 4.0  --  4.4 4.9 4.7 4.8  CL  --    < > 111   < > 107  --  110 108 107 110  CO2  --    < > 23   < > 22  --  '26 26 26 26  ' GLUCOSE  --    < > 136*   < > 284*  --  162* 207* 301* 209*  BUN  --    < > 20   < > 28*  --  24* 24* 29* 35*  CREATININE  --    < > 1.26*   < > 2.02*  --  1.52* 1.36* 1.42* 1.36*  CALCIUM  --    < > 9.1   < > 8.0*  --  8.4* 8.8* 8.7* 8.9  MG 2.0  --  2.2  --   --  2.0 2.2  --   --  2.1  PHOS 3.7  --   --   --   --  4.9* 4.5 3.6  --  3.5   < > = values in this interval not displayed.   GFR: Estimated Creatinine Clearance: 72.2 mL/min (A) (by C-G formula based on SCr of 1.36 mg/dL (H)). Recent Labs  Lab 12/22/20 1421 12/23/20 0653 12/26/20 0832 12/27/20 0502 12/28/20 0420 12/28/20 1518 12/29/20 0430  PROCALCITON <0.10  --   --   --   --  <0.10 <0.10  WBC 6.6   < > 10.8* 7.9 6.9  --  7.6   < > = values in this interval not displayed.    Liver Function Tests: No results for input(s): AST, ALT, ALKPHOS, BILITOT, PROT, ALBUMIN in the last 168 hours. No results for input(s): LIPASE, AMYLASE in the last 168 hours. No results for input(s): AMMONIA in the last 168 hours.  ABG    Component Value Date/Time   PHART 7.30 (L) 12/25/2020 1745   PCO2ART 40 12/25/2020 1745   PO2ART 82 (L) 12/25/2020 1745   HCO3 19.7 (L) 12/25/2020 1745   ACIDBASEDEF 6.3 (H) 12/25/2020 1745   O2SAT  94.7 12/25/2020 1745     Coagulation Profile: Recent Labs  Lab 12/22/20 1500  INR 1.0    Cardiac Enzymes: Recent Labs  Lab 12/26/20 2103  CKMB 3.0    HbA1C: Hemoglobin A1C  Date/Time Value Ref Range Status  10/03/2011 05:33 AM 6.2 4.2 - 6.3 % Final    Comment:    The American Diabetes Association recommends that a primary  goal of therapy should be <7% and that physicians should reevaluate the treatment regimen in patients with HbA1c values consistently >8%.    Hgb A1c MFr Bld  Date/Time Value Ref Range Status  12/22/2020 10:57 PM 8.7 (H) 4.8 - 5.6 % Final    Comment:    (NOTE) Pre diabetes:          5.7%-6.4%  Diabetes:              >6.4%  Glycemic control for   <7.0% adults with diabetes   08/20/2019 02:09 PM 13.5 (H) 4.8 - 5.6 % Final    Comment:    (NOTE) Pre diabetes:          5.7%-6.4% Diabetes:              >6.4% Glycemic control for   <7.0% adults with diabetes     CBG: Recent Labs  Lab 12/28/20 1530 12/28/20 1940 12/29/20 0012 12/29/20 0324 12/29/20 0718  GLUCAP 189* 198* 188* 193* 180*    Allergies Allergies  Allergen Reactions   Amoxicillin Swelling   Keflex [Cephalexin] Rash       DVT/GI PRX  assessed I Assessed the need for Labs I Assessed the need for Foley I Assessed the need for Central Venous Line Family Discussion when available I Assessed the need for Mobilization I made an Assessment of medications to be adjusted accordingly Safety Risk assessment completed  CASE DISCUSSED IN MULTIDISCIPLINARY ROUNDS WITH ICU TEAM   This patient is critically ill with multiple organ system failure; which, requires frequent high complexity decision making, assessment, support, evaluation, and titration of therapies. This was completed through the application of advanced monitoring technologies and extensive interpretation of multiple databases. During this encounter critical care time was devoted to patient care services described in  this note for 50 minutes.   Bennie Pierini, MD 12/29/20 7:42 AM

## 2020-12-29 NOTE — Consult Note (Signed)
PHARMACY CONSULT NOTE  Pharmacy Consult for Electrolyte Monitoring and Replacement   Recent Labs: Potassium (mmol/L)  Date Value  12/29/2020 4.8  10/03/2011 3.8   Magnesium (mg/dL)  Date Value  38/45/3646 2.1   Calcium (mg/dL)  Date Value  80/32/1224 8.9   Calcium, Total (mg/dL)  Date Value  82/50/0370 8.7   Albumin (g/dL)  Date Value  48/88/9169 4.1  10/02/2011 3.6   Phosphorus (mg/dL)  Date Value  45/08/8880 3.5   Sodium (mmol/L)  Date Value  12/29/2020 139  10/03/2011 146 (H)   Assessment: Patient is a 65 y/o M with medical history including polysubstance abuse, diabetes, HTN , CAD s/p CABG, Afib who is admitted with acute bilateral PE. He is s/p thrombectomy. Patient with acute metabolic encephalopathy requiring intubation 7/14. He is currently intubated, sedated, and on mechanical ventilation in the ICU. Pharmacy consulted to assist with electrolyte monitoring and replacement as indicated.  Scr trending down.  Tube feeds started 7/15.  Goal of Therapy:  K > 4 Mg > 2 All other electrolytes within normal limits  Plan:  --No electrolyte replacement warranted at this time --Follow-up electrolytes as needed or ordered by team  Pricilla Riffle, PharmD, BCPS 12/29/2020 10:49 AM

## 2020-12-29 NOTE — Progress Notes (Signed)
ANTICOAGULATION CONSULT NOTE  Pharmacy Consult for heparin Indication: pulmonary embolus  Patient Measurements: Heparin Dosing Weight: 101 kg  Labs: Recent Labs    12/26/20 0832 12/26/20 1628 12/26/20 2103 12/26/20 2259 12/27/20 0502 12/28/20 0420 12/28/20 1415 12/28/20 1518 12/29/20 0220  HGB 13.1  --   --   --  11.5* 11.3*  --   --   --   HCT 39.7  --   --   --  35.4* 35.4*  --   --   --   PLT 210  --   --   --  182 182  --   --   --   HEPARINUNFRC 0.76*   < >  --    < > 0.38 <0.10* <0.10*  --  0.59  CREATININE 2.48*   < >  --   --  1.52* 1.36*  --  1.42*  --   CKMB  --   --  3.0  --   --   --   --   --   --    < > = values in this interval not displayed.     Estimated Creatinine Clearance: 68.1 mL/min (A) (by C-G formula based on SCr of 1.42 mg/dL (H)).   Medical History: Past Medical History:  Diagnosis Date   Coronary artery disease    Diabetes mellitus without complication (HCC)    Hypertension    S/P CABG x 1     Assessment: 65 year old male w/ h/o CAD (s/p CABG), HTN, IIDM, came with new onset of palpitations and SOB. Elevation in troponin concerning for NSTEMI, w/ further w/u revealing acute Bil PE. Pharmacy consulted for mgmt of heparin drip.  7/11: CTA positive for acute moderate clot burden bilateral pulmonary emboli no saddle embolus.  Mild to moderate cardiomegaly.  No signs of right heart strain.  Normal RV/LV ratio.  Goal of Therapy:  Heparin level 0.3-0.7 units/ml Monitor platelets by anticoagulation protocol: Yes  Date Time aPTT/HL Rate/Comment 07/12 2257     0.37  Thera x1 07/12 0653     0.35  Thera x2 07/13 0656     0.25  Subthera 07/13 2232     0.10  Subthera 07/14 0808     0.47  Thera x 1 07/14   1406     0.65  Thera x 2 07/15 0832     0.76   Suprathera 07/15 1628     0.59  Therapeutic  07/15 2259     0.51  Therapeutic x 2 07/16 0502     0.38  Therapeutic      07/17  0420        <0.10 Subthera 07/17 1415      <0.10 Subthera 07/18 0220     0.59  Therapeutic x 1  Baseline Labs: aPTT - 30s INR - 1.0 Hgb - 15.4>14.6>13.9>13.1 Plts - 252>253>178>210  Plan:  HL scheduled for 07/17 @ 2230 never received by lab.  Ordered STAT level @ 0200 Continue heparin infusion at 1850 units/hr Re-check HL 6 hrs to confirm. CBC daily while on heparin infusion  Otelia Sergeant, PharmD, [facility] 12/29/2020 2:58 AM

## 2020-12-29 NOTE — Progress Notes (Signed)
Pharmacy Antibiotic Note  Jeremy [name] is a 65 y.o. male admitted on 12/22/2020. Pharmacy has been consulted for Zosyn dosing for Klebsiella pneumonia.  Plan: Zosyn 3.375 g IV q8h extended infusion  Height: 5\' 11"  (180.3 cm) Weight: 116.6 kg (257 lb 0.9 oz) IBW/kg (Calculated) : 75.3  Temp (24hrs), Avg:99 F (37.2 C), Min:97.52 F (36.4 C), Max:100.4 F (38 C)  Recent Labs  Lab 12/25/20 0808 12/25/20 1807 12/26/20 0832 12/26/20 1628 12/27/20 0502 12/28/20 0420 12/28/20 1518 12/29/20 0430  WBC 6.0  --  10.8*  --  7.9 6.9  --  7.6  CREATININE 1.49*   < > 2.48* 2.02* 1.52* 1.36* 1.42* 1.36*   < > = values in this interval not displayed.    Estimated Creatinine Clearance: 72.2 mL/min (A) (by C-G formula based on SCr of 1.36 mg/dL (H)).    Allergies  Allergen Reactions   Amoxicillin Swelling   Keflex [Cephalexin] Rash    Antimicrobials this admission: Zosyn 7/18 >>   Microbiology results: 7/17 Resp cx: K.pneumoniae 7/15 Resp cx: normal flora 7/13 MRSA PCR: negative  Thank you for allowing pharmacy to be a part of this patient's care.  8/13, PharmD, BCPS 12/29/2020 1:42 PM

## 2020-12-30 DIAGNOSIS — I4891 Unspecified atrial fibrillation: Secondary | ICD-10-CM | POA: Diagnosis not present

## 2020-12-30 DIAGNOSIS — J9601 Acute respiratory failure with hypoxia: Secondary | ICD-10-CM | POA: Diagnosis not present

## 2020-12-30 DIAGNOSIS — R410 Disorientation, unspecified: Secondary | ICD-10-CM

## 2020-12-30 DIAGNOSIS — I2699 Other pulmonary embolism without acute cor pulmonale: Secondary | ICD-10-CM | POA: Diagnosis not present

## 2020-12-30 LAB — PROCALCITONIN: Procalcitonin: <0.1 ng/mL

## 2020-12-30 LAB — CBC
HCT: 34.2 % — ABNORMAL LOW (ref 39.0–52.0)
Hemoglobin: 11.1 g/dL — ABNORMAL LOW (ref 13.0–17.0)
MCH: 32.1 pg (ref 26.0–34.0)
MCHC: 32.5 g/dL (ref 30.0–36.0)
MCV: 98.8 fL (ref 80.0–100.0)
Platelets: 208 10*3/uL (ref 150–400)
RBC: 3.46 MIL/uL — ABNORMAL LOW (ref 4.22–5.81)
RDW: 13.8 % (ref 11.5–15.5)
WBC: 8 10*3/uL (ref 4.0–10.5)
nRBC: 0 % (ref 0.0–0.2)

## 2020-12-30 LAB — GLUCOSE, CAPILLARY
Glucose-Capillary: 120 mg/dL — ABNORMAL HIGH (ref 70–99)
Glucose-Capillary: 162 mg/dL — ABNORMAL HIGH (ref 70–99)
Glucose-Capillary: 170 mg/dL — ABNORMAL HIGH (ref 70–99)
Glucose-Capillary: 187 mg/dL — ABNORMAL HIGH (ref 70–99)
Glucose-Capillary: 197 mg/dL — ABNORMAL HIGH (ref 70–99)
Glucose-Capillary: 201 mg/dL — ABNORMAL HIGH (ref 70–99)

## 2020-12-30 LAB — BASIC METABOLIC PANEL
Anion gap: 7 (ref 5–15)
BUN: 36 mg/dL — ABNORMAL HIGH (ref 8–23)
CO2: 29 mmol/L (ref 22–32)
Calcium: 9.1 mg/dL (ref 8.9–10.3)
Chloride: 106 mmol/L (ref 98–111)
Creatinine, Ser: 1.29 mg/dL — ABNORMAL HIGH (ref 0.61–1.24)
GFR, Estimated: >60 mL/min (ref >60)
Glucose, Bld: 197 mg/dL — ABNORMAL HIGH (ref 70–99)
Potassium: 3.9 mmol/L (ref 3.5–5.1)
Sodium: 142 mmol/L (ref 135–145)

## 2020-12-30 LAB — PHOSPHORUS: Phosphorus: 3.6 mg/dL (ref 2.5–4.6)

## 2020-12-30 LAB — MAGNESIUM: Magnesium: 1.9 mg/dL (ref 1.7–2.4)

## 2020-12-30 LAB — HEPARIN LEVEL (UNFRACTIONATED): Heparin Unfractionated: 0.61 IU/mL (ref 0.30–0.70)

## 2020-12-30 MED ORDER — HALOPERIDOL LACTATE 5 MG/ML IJ SOLN: 5.0000 mg | Freq: Once | INTRAMUSCULAR | Status: AC

## 2020-12-30 MED ORDER — FUROSEMIDE 10 MG/ML IJ SOLN
40.0000 mg | Freq: Once | INTRAMUSCULAR | Status: AC
  Administered 2020-12-30: 40 mg via INTRAVENOUS
  Filled 2020-12-30: qty 4

## 2020-12-30 MED ORDER — LEVALBUTEROL HCL 0.63 MG/3ML IN NEBU
0.6300 mg | INHALATION_SOLUTION | Freq: Three times a day (TID) | RESPIRATORY_TRACT | Status: DC
  Administered 2020-12-30 – 2021-01-01 (×6): 0.63 mg via RESPIRATORY_TRACT
  Filled 2020-12-30 (×6): qty 3

## 2020-12-30 MED ORDER — HALOPERIDOL LACTATE 5 MG/ML IJ SOLN
INTRAMUSCULAR | Status: AC
  Administered 2020-12-30: 5 mg via INTRAVENOUS
  Filled 2020-12-30: qty 1

## 2020-12-30 MED ORDER — PANTOPRAZOLE SODIUM 40 MG PO PACK
40.0000 mg | PACK | Freq: Every day | ORAL | Status: DC
  Administered 2020-12-30: 40 mg
  Filled 2020-12-30: qty 20

## 2020-12-30 MED ORDER — DEXMEDETOMIDINE HCL IN NACL 400 MCG/100ML IV SOLN
0.4000 ug/kg/h | INTRAVENOUS | Status: DC
  Administered 2020-12-30: 1 ug/kg/h via INTRAVENOUS
  Administered 2020-12-30: 0.4 ug/kg/h via INTRAVENOUS
  Administered 2020-12-30: 0.3 ug/kg/h via INTRAVENOUS
  Administered 2020-12-31: 0.4 ug/kg/h via INTRAVENOUS
  Administered 2020-12-31 (×2): 1.2 ug/kg/h via INTRAVENOUS
  Filled 2020-12-30 (×6): qty 100

## 2020-12-30 MED ORDER — METOPROLOL TARTRATE 5 MG/5ML IV SOLN
5.0000 mg | Freq: Four times a day (QID) | INTRAVENOUS | Status: DC
  Administered 2020-12-31 (×2): 5 mg via INTRAVENOUS
  Filled 2020-12-30 (×2): qty 5

## 2020-12-30 MED ORDER — PANTOPRAZOLE SODIUM 40 MG IV SOLR
40.0000 mg | INTRAVENOUS | Status: DC
  Administered 2020-12-31: 40 mg via INTRAVENOUS
  Filled 2020-12-30: qty 40

## 2020-12-30 MED ORDER — FOLIC ACID 5 MG/ML IJ SOLN
1.0000 mg | Freq: Every day | INTRAMUSCULAR | Status: DC
  Administered 2020-12-31 – 2021-01-01 (×2): 1 mg via INTRAVENOUS
  Filled 2020-12-30 (×2): qty 0.2

## 2020-12-30 MED ORDER — METOPROLOL TARTRATE 25 MG/10 ML ORAL SUSPENSION
25.0000 mg | Freq: Four times a day (QID) | ORAL | Status: DC
  Administered 2020-12-30: 25 mg
  Filled 2020-12-30 (×5): qty 10

## 2020-12-30 MED ORDER — CEFAZOLIN SODIUM-DEXTROSE 1-4 GM/50ML-% IV SOLN
1.0000 g | Freq: Three times a day (TID) | INTRAVENOUS | Status: DC
  Administered 2020-12-30 – 2021-01-03 (×12): 1 g via INTRAVENOUS
  Filled 2020-12-30 (×18): qty 50

## 2020-12-30 NOTE — Consult Note (Signed)
PHARMACY CONSULT NOTE  Pharmacy Consult for Electrolyte Monitoring and Replacement   Recent Labs: Potassium (mmol/L)  Date Value  12/30/2020 3.9  10/03/2011 3.8   Magnesium (mg/dL)  Date Value  62/37/6283 1.9   Calcium (mg/dL)  Date Value  15/17/6160 9.1   Calcium, Total (mg/dL)  Date Value  73/71/0626 8.7   Albumin (g/dL)  Date Value  94/85/4627 4.1  10/02/2011 3.6   Phosphorus (mg/dL)  Date Value  03/50/0938 3.6   Sodium (mmol/L)  Date Value  12/30/2020 142  10/03/2011 146 (H)   Assessment: Patient is a 65 y/o M with medical history including polysubstance abuse, diabetes, HTN , CAD s/p CABG, Afib who is admitted with acute bilateral PE. He is s/p thrombectomy. Patient with acute metabolic encephalopathy requiring intubation 7/14. Patient extubated 7/19 to BiPAP. Pharmacy consulted to assist with electrolyte monitoring and replacement as indicated.  Goal of Therapy:  Electrolytes WNL  Plan:  --No electrolyte replacement warranted at this time --Follow-up electrolytes as needed or ordered by team  Pricilla Riffle, PharmD, BCPS 12/30/2020 10:38 AM

## 2020-12-30 NOTE — Progress Notes (Signed)
ANTICOAGULATION CONSULT NOTE  Pharmacy Consult for heparin Indication: pulmonary embolus  Patient Measurements: Heparin Dosing Weight: 101 kg  Labs: Recent Labs    12/28/20 0420 12/28/20 1415 12/28/20 1518 12/29/20 0220 12/29/20 0430 12/29/20 0858 12/30/20 0500  HGB 11.3*  --   --   --  10.5*  --  11.1*  HCT 35.4*  --   --   --  32.9*  --  34.2*  PLT 182  --   --   --  165  --  208  HEPARINUNFRC <0.10*   < >  --  0.59  --  0.50 0.61  CREATININE 1.36*  --  1.42*  --  1.36*  --   --    < > = values in this interval not displayed.     Estimated Creatinine Clearance: 72.2 mL/min (A) (by C-G formula based on SCr of 1.36 mg/dL (H)).   Medical History: Past Medical History:  Diagnosis Date   Coronary artery disease    Diabetes mellitus without complication (HCC)    Hypertension    S/P CABG x 1     Assessment: 65 year old male w/ h/o CAD (s/p CABG), HTN, IIDM, came with new onset of palpitations and SOB. Elevation in troponin concerning for NSTEMI, w/ further w/u revealing acute Bil PE. Pharmacy consulted for mgmt of heparin drip.  7/11: CTA positive for acute moderate clot burden bilateral pulmonary emboli no saddle embolus.  Mild to moderate cardiomegaly.  No signs of right heart strain.  Normal RV/LV ratio.  Goal of Therapy:  Heparin level 0.3-0.7 units/ml Monitor platelets by anticoagulation protocol: Yes  Date Time aPTT/HL Rate/Comment 07/12 2257     0.37  Thera x1 07/12 0653     0.35  Thera x2 07/13 0656     0.25  Subthera 07/13 2232     0.10  Subthera 07/14 0808     0.47  Thera x 1 07/14   1406     0.65  Thera x 2 07/15 0832     0.76   Suprathera 07/15 1628     0.59  Therapeutic  07/15 2259     0.51  Therapeutic x 2 07/16 0502     0.38  Therapeutic      07/17  0420        <0.10 Subthera 07/17 1415     <0.10 Subthera 07/18 0220     0.59  Therapeutic x 1 07/18 0858     0.50  Therapeutic x 2 07/19   0500        0.61            Therapeutic X 3    Plan:   7/19:  HL @ 0500 = 0.61 Will continue pt on current rate and recheck HL on 7/20 with AM labs.   12/30/2020 5:39 AM

## 2020-12-30 NOTE — Progress Notes (Signed)
Bedside swallow performed by RN. Patient is alert and oriented x 1-to self. When 3 oz provided, patient unable to hold cup by himself and approximately half was swallowed with the other half falling out of his mouth. No coughing/choking/gagging occurred. Per Dr. Arlean Hopping, Night shift RN to perform another bedside swallow eval. RN recommendation for SLP consult.

## 2020-12-30 NOTE — Consult Note (Addendum)
[facility] Consultation Note  Patient ID: Jeremy [name], MRN: [id number], DOB/AGE: 1955/07/29 65 y.o. Admit date: 12/22/2020   Date of Consult: 12/30/2020 Primary Physician: Patient, No Pcp Per (Inactive) Primary Cardiologist: none  Chief Complaint:  Chief Complaint  Patient presents with   Shortness of Breath   Reason for Consult:  Cardiac failure with intermittent atrial fibrillation, diastolic heart failure with ejection fraction less than 25%  HPI: 65 y.o. male patient is a 65 year old male with a past medical history of CAD s/p CABG, hypertension, type 2 diabetes, polysubstance abuse who was first seen in the ED on 12/23/2018 and diagnosed with bilateral pulmonary embolism.  At this time patient was noted to be in sinus tachycardia with a ventricular rate of 127 bpm and a troponin of 74.  CTA of the chest showed bilateral pulmonary embolism with no signs of right heart strain.  Patient underwent a pulmonary thrombectomy on 7/13 after which she developed severe agitation and toxic metabolic encephalopathy.   Echocardiogram on 12/24/2020 showed severely decreased left ventricular function with an estimated ejection fraction of less than 20% with global left ventricular hypokinesis.  Patient was also reported to have intermittent atrial fibrillation for which he has been placed on Lopressor for rate control.  Patient remains on heparin drip.  Patient is not currently able to participate in visit and answer questions secondary to encephalopathy and severe drug withdrawal on sedatives.  Past Medical History:  Diagnosis Date   Coronary artery disease    Diabetes mellitus without complication ([place])    Hypertension    S/P CABG x 1       Surgical History:  Past Surgical History:  Procedure Laterality Date   ANKLE ARTHROSCOPY W/ OPEN REPAIR Right    CORONARY ARTERY BYPASS GRAFT     PULMONARY THROMBECTOMY Bilateral 12/24/2020   Procedure: PULMONARY THROMBECTOMY;  Surgeon:  Algernon Huxley, MD;  Location: [facility];  Service: Cardiovascular;  Laterality: Bilateral;     Home Meds: Prior to Admission medications   Medication Sig Start Date End Date Taking? Authorizing Provider  metFORMIN (GLUCOPHAGE) 1000 MG tablet Take 1 tablet (1,000 mg total) by mouth 2 (two) times daily with a meal. 08/21/19  Yes Dhungel, Nishant, MD  naproxen sodium (ALEVE) 220 MG tablet Take 440 mg by mouth daily as needed.   Yes [provider]  blood glucose meter kit and supplies KIT Dispense based on patient and insurance preference. Use up to four times daily as directed. (FOR ICD-9 250.00, 250.01). 08/21/19   Dhungel, Nishant, MD  glimepiride (AMARYL) 2 MG tablet Take 1 tablet (2 mg total) by mouth daily with breakfast. Patient not taking: Reported on 12/22/2020 08/21/19   Dhungel, Flonnie Overman, MD  hydrOXYzine (ATARAX/VISTARIL) 25 MG tablet Take 25 mg by mouth 3 (three) times daily as needed for itching. Patient not taking: Reported on 12/22/2020    [provider]  linezolid (ZYVOX) 600 MG tablet Take 1 tablet (600 mg total) by mouth 2 (two) times daily. Patient not taking: Reported on 12/22/2020 10/18/19   Tsosie Billing, MD  losartan (COZAAR) 50 MG tablet Take 1 tablet (50 mg total) by mouth daily. Patient not taking: Reported on 12/22/2020 08/21/19   Dhungel, Flonnie Overman, MD  sitaGLIPtin (JANUVIA) 100 MG tablet Take 1 tablet (100 mg total) by mouth daily. Patient not taking: Reported on 12/22/2020 08/21/19   Louellen Molder, MD    Inpatient Medications:   chlorhexidine gluconate (MEDLINE KIT)  15 mL Mouth Rinse  BID   Chlorhexidine Gluconate Cloth  6 each Topical Daily   feeding supplement (PROSource TF)  90 mL Per Tube TID   [START ON 8/75/6433] folic acid  1 mg Intravenous Daily   insulin aspart  0-20 Units Subcutaneous Q4H   levalbuterol  0.63 mg Nebulization TID   metoprolol tartrate  5 mg Intravenous Q6H   multivitamin with minerals  1 tablet Per Tube Daily    nicotine  21 mg Transdermal Daily   nutrition supplement (JUVEN)  1 packet Per Tube BID BM   [START ON 12/31/2020] pantoprazole (PROTONIX) IV  40 mg Intravenous Q24H   sodium chloride flush  10-40 mL Intracatheter Q12H   thiamine  100 mg Oral Daily   Or   thiamine  100 mg Intravenous Daily     ceFAZolin (ANCEF) IV Stopped (12/30/20 1447)   dexmedetomidine (PRECEDEX) IV infusion Stopped (12/30/20 1534)   heparin 1,850 Units/hr (12/30/20 1800)    Allergies:  Allergies  Allergen Reactions   Amoxicillin Swelling   Keflex [Cephalexin] Rash    Social History   Socioeconomic History   Marital status: Married    Spouse name: Not on file   Number of children: Not on file   Years of education: Not on file   Highest education level: Not on file  Occupational History   Not on file  Tobacco Use   Smoking status: Some Days    Packs/day: 0.50    Types: Cigarettes   Smokeless tobacco: Former  Scientific laboratory technician Use: Never used  Substance and Sexual Activity   Alcohol use: Not Currently   Drug use: Yes    Types: Marijuana   Sexual activity: Not on file  Other Topics Concern   Not on file  Social History Narrative   Not on file   Social Determinants of Health   Financial Resource Strain: Not on file  Food Insecurity: Not on file  Transportation Needs: Not on file  Physical Activity: Not on file  Stress: Not on file  Social Connections: Not on file  Intimate Partner Violence: Not on file     Family History  Problem Relation Age of Onset   Heart attack Father      Review of Systems Patient unable to participate in review of systems due to toxic metabolic encephalopathy and severe drug withdrawals   Labs: No results for input(s): CKTOTAL, CKMB, TROPONINI in the last 72 hours. Lab Results  Component Value Date   WBC 8.0 12/30/2020   HGB 11.1 (L) 12/30/2020   HCT 34.2 (L) 12/30/2020   MCV 98.8 12/30/2020   PLT 208 12/30/2020    Recent Labs  Lab 12/30/20 0500   NA 142  K 3.9  CL 106  CO2 29  BUN 36*  CREATININE 1.29*  CALCIUM 9.1  GLUCOSE 197*   Lab Results  Component Value Date   CHOL 147 10/03/2011   HDL 34 (L) 10/03/2011   LDLCALC 88 10/03/2011   TRIG 127 10/03/2011   Lab Results  Component Value Date   DDIMER 2.91 (H) 12/22/2020    Radiology/Studies:  DG Chest 2 View  Result Date: 12/22/2020 CLINICAL DATA:  Dyspnea, palpitations EXAM: CHEST - 2 VIEW COMPARISON:  02/12/2018 chest radiograph. FINDINGS: Intact sternotomy wires. Stable cardiomediastinal silhouette with mild cardiomegaly. No pneumothorax. No pleural effusion. Mild pulmonary edema. No acute consolidative airspace disease. IMPRESSION: Mild congestive heart failure. Electronically Signed   By: Ilona Sorrel M.D.   On: 12/22/2020 14:54  DG Abd 1 View  Result Date: 12/25/2020 CLINICAL DATA:  Check gastric catheter placement EXAM: ABDOMEN - 1 VIEW COMPARISON:  None. FINDINGS: Gastric catheter is noted within the stomach. Scattered large and small bowel gas is noted. No free air is seen. IMPRESSION: Gastric catheter within the stomach. Electronically Signed   By: Inez Catalina M.D.   On: 12/25/2020 18:35   CT HEAD WO CONTRAST  Result Date: 12/26/2020 CLINICAL DATA:  65 year old male status post acute pulmonary emboli. Altered mental status with age indeterminate hypodensity in the right caudate on head CT yesterday. Found with altered mental status now. Intubated. EXAM: CT HEAD WITHOUT CONTRAST TECHNIQUE: Contiguous axial images were obtained from the base of the skull through the vertex without intravenous contrast. COMPARISON:  Head CT 12/25/2020. FINDINGS: Brain: Unchanged asymmetric hypodensity in the right caudate (series 6, image 16) and patchy bilateral cerebral white matter hypodensity since yesterday. Focal cystic encephalomalacia near the body of the right corpus callosum is stable on sagittal image 25. Small chronic appearing lacunar infarct of the right cerebellum on  series 6, image 23 is stable. No midline shift, ventriculomegaly, mass effect, evidence of mass lesion, intracranial hemorrhage or evidence of cortically based acute infarction. No cortical encephalomalacia identified. Vascular: Calcified atherosclerosis at the skull base. No suspicious intracranial vascular hyperdensity. Skull: Stable and intact. Sinuses/Orbits: Visualized paranasal sinuses and mastoids are stable and well aerated. Other: No acute orbit or scalp soft tissue finding. Fluid in the visible pharynx related to intubation. IMPRESSION: 1. Unchanged age indeterminate small vessel disease in the cerebral hemispheres, greater on the right. 2. No new intracranial abnormality. Electronically Signed   By: Genevie Ann M.D.   On: 12/26/2020 04:33   CT HEAD WO CONTRAST  Result Date: 12/25/2020 CLINICAL DATA:  Initial evaluation for altered mental status, agitation. Prior pulmonary thrombectomy. EXAM: CT HEAD WITHOUT CONTRAST TECHNIQUE: Contiguous axial images were obtained from the base of the skull through the vertex without intravenous contrast. COMPARISON:  None available. FINDINGS: Brain: Generalized age-related cerebral atrophy. Patchy and confluent hypodensity involving the periventricular and deep white matter both cerebral hemispheres, most consistent with chronic small vessel ischemic disease, moderately advanced in nature. Few scattered superimposed remote lacunar infarcts present about the hemispheric cerebral white matter. Few small remote bilateral cerebellar infarcts noted. Approximate 1.5 cm hypodensity noted involving the anterior right caudate/internal capsule, somewhat age indeterminate, and could be chronic (series 3, image 16). No other evidence for acute large vessel territory infarct. No acute intracranial hemorrhage. No mass lesion or midline shift. No hydrocephalus or extra-axial fluid collection. Vascular: No hyperdense vessel. Calcified atherosclerosis present at the skull base. Skull:  Scalp soft tissues within normal limits.  Calvarium intact. Sinuses/Orbits: Globes and orbital soft tissues demonstrate no acute finding. Paranasal sinuses are largely clear. No mastoid effusion. Other: None. IMPRESSION: 1. 1.5 cm hypodensity involving the anterior right caudate/internal capsule, indeterminate. While this finding may be chronic in nature, possible acute to subacute ischemic change is difficult to exclude. If there is clinical concern for possible acute infarct at this location, this could be further assessed with dedicated MRI as clinically warranted. 2. No other acute intracranial abnormality. 3. Generalized age-related cerebral atrophy with moderate chronic small vessel ischemic disease, with a few additional scattered remote lacunar infarcts as above. Electronically Signed   By: Jeannine Boga M.D.   On: 12/25/2020 03:22   CT Angio Chest PE W and/or Wo Contrast  Result Date: 12/22/2020 CLINICAL DATA:  Palpitations, chronic cough EXAM: CT  ANGIOGRAPHY CHEST WITH CONTRAST TECHNIQUE: Multidetector CT imaging of the chest was performed using the standard protocol during bolus administration of intravenous contrast. Multiplanar CT image reconstructions and MIPs were obtained to evaluate the vascular anatomy. CONTRAST:  88m OMNIPAQUE IOHEXOL 350 MG/ML SOLN COMPARISON:  Chest radiograph from earlier today. 10/02/2011 chest CT. FINDINGS: Cardiovascular: The study is high quality for the evaluation of pulmonary embolism. No saddle embolus. Acute moderate clot burden bilateral pulmonary emboli including segmental and subsegmental left upper and left lower lobe branches, distal right pulmonary artery and lobar, segmental and subsegmental right upper lobe branches. Atherosclerotic nonaneurysmal thoracic aorta. Top-normal caliber main pulmonary artery (3.1 cm diameter). Mild-to-moderate cardiomegaly. No significant pericardial fluid/thickening. RV/LV ratio 0.76, within normal limits.  Mediastinum/Nodes: No discrete thyroid nodules. Unremarkable esophagus. No pathologically enlarged axillary, mediastinal or hilar lymph nodes. Lungs/Pleura: No pneumothorax. No pleural effusion. A small portion of the dependent right lung base was inadvertently excluded from the scan. Posterior right middle lobe 1.0 cm solid pulmonary nodule (series 6/image 62), not substantially changed since 02/10/2018 CT abdomen study, considered benign. No acute consolidative airspace disease, lung masses or additional significant pulmonary nodules. Upper abdomen: Mild contrast reflux into the IVC and hepatic veins. Musculoskeletal: No aggressive appearing focal osseous lesions. Intact sternotomy wires. Mild thoracic spondylosis. Review of the MIP images confirms the above findings. IMPRESSION: 1. Acute moderate clot burden bilateral pulmonary emboli, extending proximally to the distal right pulmonary artery level. No saddle embolus. Normal RV/LV ratio. 2. Mild-to-moderate cardiomegaly. Mild contrast reflux into the IVC and hepatic veins. 3. Aortic Atherosclerosis (ICD10-I70.0). Critical Value/emergent results were called by telephone at the time of interpretation on 12/22/2020 at 4:57 pm to provider EValora Piccolo MD, who verbally acknowledged these results. Electronically Signed   By: JIlona SorrelM.D.   On: 12/22/2020 17:00   CT CERVICAL SPINE WO CONTRAST  Result Date: 12/26/2020 CLINICAL DATA:  65year old male status post acute pulmonary emboli. Altered mental status with age indeterminate hypodensity in the right caudate on head CT yesterday. Found with altered mental status now. Intubated. EXAM: CT CERVICAL SPINE WITHOUT CONTRAST TECHNIQUE: Multidetector CT imaging of the cervical spine was performed without intravenous contrast. Multiplanar CT image reconstructions were also generated. COMPARISON:  Head CT today. FINDINGS: Alignment: Mild reversal of cervical lordosis. Cervicothoracic junction alignment is within  normal limits. Bilateral posterior element alignment is within normal limits. Skull base and vertebrae: Visualized skull base is intact. No atlanto-occipital dissociation. C1 and C2 appear intact and aligned. No acute osseous abnormality identified. Soft tissues and spinal canal: No prevertebral fluid or swelling. No visible canal hematoma. Intubated and enteric tube in place. Bubbly opacity in the pharynx. Bilateral calcified carotid artery atherosclerosis in the neck. Disc levels: Moderate to severe cervical disc and endplate degeneration CM4-B5and C6-C7. Evidence of mild spinal stenosis at those levels. Mild to moderate degeneration elsewhere. Upper chest: T1 level appears intact. Negative visible lung apices. The endotracheal and enteric tubes course appropriately into the trachea and esophagus, respectively. IMPRESSION: 1. No acute osseous abnormality identified in the cervical spine. 2. 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 3. Satisfactory visible endotracheal and enteric tubes. Electronically Signed   By: HGenevie AnnM.D.   On: 12/26/2020 04:36   MR BRAIN WO CONTRAST  Result Date: 12/27/2020 CLINICAL DATA:  Stroke follow-up EXAM: MRI HEAD WITHOUT CONTRAST TECHNIQUE: Multiplanar, multiecho pulse sequences of the brain and surrounding structures were obtained without intravenous contrast. COMPARISON:  CT head  12/26/2020 FINDINGS: Brain: Negative for acute infarct. Moderate chronic microvascular ischemic change throughout the white matter bilaterally. Small chronic infarcts in the cerebellum bilaterally. Negative for mass lesion. Chronic microhemorrhage in the left frontal white matter. No fluid collection or midline shift. Vascular: Normal arterial flow voids Skull and upper cervical spine: Negative Sinuses/Orbits: Negative Other: None IMPRESSION: Negative for acute infarct. Moderate chronic microvascular ischemic change. Electronically Signed   By: Franchot Gallo M.D.   On: 12/27/2020 12:58   US RENAL  Result Date: 12/27/2020 CLINICAL DATA:  Acute kidney injury. EXAM: RENAL / URINARY TRACT ULTRASOUND COMPLETE COMPARISON:  Abdomen and pelvis CT dated 02/10/2018 FINDINGS: Right Kidney: Renal measurements: 9.0 x 5.2 x 4.1 cm = volume: 100 mL. Echogenicity within normal limits. No mass or hydronephrosis visualized. Left Kidney: Renal measurements: 11.6 x 6.3 x 5.7 cm = volume: 219 mL. Echogenicity within normal limits. No mass or hydronephrosis visualized. Bladder: No urine in the bladder with a Foley catheter in place. Other: None. IMPRESSION: Normal examination. Electronically Signed   By: Claudie Revering M.D.   On: 12/27/2020 11:36   PERIPHERAL VASCULAR CATHETERIZATION  Result Date: 12/24/2020 See surgical note for result.  US Venous Img Lower Bilateral (DVT)  Result Date: 12/23/2020 CLINICAL DATA:  Known pulmonary emboli. EXAM: BILATERAL LOWER EXTREMITY VENOUS DOPPLER ULTRASOUND TECHNIQUE: Gray-scale sonography with graded compression, as well as color Doppler and duplex ultrasound were performed to evaluate the lower extremity deep venous systems from the level of the common femoral vein and including the common femoral, femoral, profunda femoral, popliteal and calf veins including the posterior tibial, peroneal and gastrocnemius veins when visible. The superficial great saphenous vein was also interrogated. Spectral Doppler was utilized to evaluate flow at rest and with distal augmentation maneuvers in the common femoral, femoral and popliteal veins. COMPARISON:  None. FINDINGS: RIGHT LOWER EXTREMITY Common Femoral Vein: No evidence of thrombus. Normal compressibility, respiratory phasicity and response to augmentation. Saphenofemoral Junction: No evidence of thrombus. Normal compressibility and flow on color Doppler imaging. Profunda Femoral Vein: No evidence of thrombus. Normal compressibility and flow on color Doppler imaging. Femoral Vein: No evidence  of thrombus. Normal compressibility, respiratory phasicity and response to augmentation. Popliteal Vein: No evidence of thrombus. Normal compressibility, respiratory phasicity and response to augmentation. Calf Veins: No evidence of thrombus. Normal compressibility and flow on color Doppler imaging. Superficial Great Saphenous Vein: No evidence of thrombus. Normal compressibility. Venous Reflux:  None. Other Findings:  None. LEFT LOWER EXTREMITY Common Femoral Vein: No evidence of thrombus. Normal compressibility, respiratory phasicity and response to augmentation. Saphenofemoral Junction: No evidence of thrombus. Normal compressibility and flow on color Doppler imaging. Profunda Femoral Vein: No evidence of thrombus. Normal compressibility and flow on color Doppler imaging. Femoral Vein: No evidence of thrombus. Normal compressibility, respiratory phasicity and response to augmentation. Popliteal Vein: No evidence of thrombus. Normal compressibility, respiratory phasicity and response to augmentation. Calf Veins: No evidence of thrombus. Normal compressibility and flow on color Doppler imaging. Superficial Great Saphenous Vein: No evidence of thrombus. Normal compressibility. Venous Reflux:  None. Other Findings:  None. IMPRESSION: No evidence of deep venous thrombosis in either lower extremity. Electronically Signed   By: Inez Catalina M.D.   On: 12/23/2020 02:56   DG Chest Port 1 View  Result Date: 12/29/2020 CLINICAL DATA:  Acute respiratory failure EXAM: PORTABLE CHEST 1 VIEW COMPARISON:  12/26/2020 FINDINGS: Endotracheal tube 6.8 cm above the carina. Esophageal Doppler POBA within the expected mid esophagus. Nasogastric tube extends into  the upper abdomen beyond the margin of the examination. Right upper extremity PICC line tip noted within the superior vena cava. Pulmonary insufflation is stable. Lungs are clear. No pneumothorax. Possible tiny right pleural effusion. Coronary artery bypass grafting has  been performed. Mild cardiomegaly is stable. There is central pulmonary vascular engorgement without overt pulmonary edema. IMPRESSION: Stable support lines and tubes. Stable pulmonary insufflation. Stable cardiomegaly. Engorgement of the central pulmonary vessels in keeping with mild cardiogenic failure or volume overload. Electronically Signed   By: Fidela Salisbury MD   On: 12/29/2020 04:14   DG Chest Port 1 View  Result Date: 12/26/2020 CLINICAL DATA:  Status post peripherally inserted central catheter (PICC) central line placement. EXAM: PORTABLE CHEST 1 VIEW COMPARISON:  Prior chest radiographs 12/25/2020 and earlier. FINDINGS: A right-sided PICC is now present with tip projecting at the level of the lower SVC. The ET tube terminates just below the level of the clavicular heads. An enteric tube passes below the level of the left hemidiaphragm with tip excluded from the field of view. Prior median sternotomy and CABG. Unchanged cardiomegaly. Central pulmonary vascular congestion. Minimal atelectasis at the right lung base. No sizable pleural effusion or evidence of pneumothorax. No acute bony abnormality identified. IMPRESSION: Right-sided PICC with tip projecting at the level of the lower SVC. Position of additional lines/tubes, as described. Unchanged cardiomegaly with central pulmonary vascular congestion. Minimal atelectasis at the right lung base. Electronically Signed   By: Kellie Simmering DO   On: 12/26/2020 13:36   DG Chest Port 1 View  Result Date: 12/25/2020 CLINICAL DATA:  Status post intubation, initial encounter EXAM: PORTABLE CHEST 1 VIEW COMPARISON:  12/22/2020 FINDINGS: Endotracheal tube is noted in satisfactory position at the level of the aortic arch. Gastric catheter extends into the stomach. Postsurgical changes are again seen. Cardiomegaly is again noted. Mild linear atelectasis is noted in the mid lungs bilaterally stable from the prior exam. No sizable effusion is seen. Mild vascular  congestion is seen. IMPRESSION: Mild vascular congestion stable from the prior exam. Tubes and lines as described. Electronically Signed   By: Inez Catalina M.D.   On: 12/25/2020 18:35   [place] EKG SITE RITE  Result Date: 12/26/2020 If Site Rite image not attached, placement could not be confirmed due to current cardiac rhythm.   EKG: Last on 12/26/2020: Sinus tachycardia at a ventricular rate of 120 bpm with prolonged PR interval  Weights: Filed Weights   12/28/20 0500 12/29/20 0425 12/30/20 0500  Weight: 113 kg 116.6 kg 104.9 kg     Physical Exam: Blood pressure 124/66, pulse (!) 106, temperature 98.8 F (37.1 C), temperature source Oral, resp. rate 19, height '5\' 11"'  (1.803 m), weight 104.9 kg, SpO2 99 %. Body mass index is 32.25 kg/m. General: Well developed, well nourished, confused, sedated Head eyes ears nose throat: Normocephalic, atraumatic, sclera non-icteric, no xanthomas, nares are without discharge. No apparent thyromegaly and/or mass  Lungs: Normal respiratory effort.  no wheezes, no rales, no rhonchi.  Heart: Tachycardic with normal S1 S2. no murmur gallop, no rub, PMI is normal size and placement, carotid upstroke normal without bruit, jugular venous pressure is normal Abdomen: Soft, non-tender, non-distended with normoactive bowel sounds. No hepatomegaly. No rebound/guarding. No obvious abdominal masses. Abdominal aorta is normal size without bruit Extremities: No edema. no cyanosis, no clubbing, no ulcers  Peripheral : 2+ bilateral upper extremity pulses, 2+ bilateral femoral pulses, 2+ bilateral dorsal pedal pulse Neuro: Alert and oriented. No facial asymmetry. No  focal deficit. Moves all extremities spontaneously. Musculoskeletal: Normal muscle tone without kyphosis Psych:  Responds to questions appropriately with a normal affect.    Assessment: 1.  Acute diastolic heart failure with reduced ejection fraction less than 25% 2.  Intermittent atrial fibrillation 3.  Sinus  tachycardia   Plan: -Continue metoprolol at current dose for management of tachycardia and atrial fibrillation rate control. -Will consider increasing dose of metoprolol if patient continues to be tachycardic and blood pressure allows -Continue heparin drip for stroke risk reduction with atrial fibrillation -Continue Lasix to maintain euvolemic status inpatient  Signed, Jettie Booze [facility] Cardiology 12/30/2020, 6:29 PM The patient has been interviewed and examined. I agree with assessment and plan above. Serafina Royals MD [facility]

## 2020-12-30 NOTE — Progress Notes (Signed)
Nutrition Follow up Note   DOCUMENTATION CODES:   Obesity unspecified  INTERVENTION:   RD will monitor for diet advancement and add supplements if appropriate   NUTRITION DIAGNOSIS:   Inadequate oral intake related to inability to eat (pt sedated and ventilated) as evidenced by NPO status.  GOAL:   Patient will meet greater than or equal to 90% of their needs -previously met with tube feeds   MONITOR:   Diet advancement, Labs, Weight trends, Skin, I & O's  ASSESSMENT:   65 y/o male with h/o CAD, CABG x 1, NSTEMI, medical noncompaince, HTN, DM and substance abuse who is admitted with b/l PE, new Afib and agitation/delirium requiring intubation.  Pt s/p mechanical thrombectomy 7/13  Pt extubated to bipap today. OGT removed and pt remains NPO. RD will monitor for diet advancement vs the need for nutrition support. Refeed labs stable. Per chart, pt down ~29lbs since admit and appears to be down ~9lbs(4%) from his UBW. Pt +2.3L on his I & Os.   Medications reviewed and include: folic acid, insulin, MVI, nicotine, protonix, thiamine, cefazolin, precedex, heparin  Labs reviewed: K 3.9 wnl, BUN 36(H), creat 1.29(H), P 3.6 wnl, Mg 1.9 wnl cbgs- 187, 162, 197 x 24 hrs  Diet Order:   Diet Order             Diet NPO time specified  Diet effective now                  EDUCATION NEEDS:   No education needs have been identified at this time  Skin:  Skin Assessment: Reviewed RN Assessment (wound R ankle)  Last BM:  7/18- TYPE 7  Height:   Ht Readings from Last 1 Encounters:  12/28/20 '5\' 11"'  (1.803 m)    Weight:   Wt Readings from Last 1 Encounters:  12/30/20 104.9 kg    Ideal Body Weight:  78.18 kg  BMI:  Body mass index is 32.25 kg/m.  Estimated Nutritional Needs:   Kcal:  1297-1651kcal/day  Protein:  >156g/day  Fluid:  2.3-2.7L/day  Koleen Distance MS, RD, LDN Please refer to [facility] for RD and/or RD on-call/weekend/after hours pager

## 2020-12-30 NOTE — Plan of Care (Signed)
Neuro: Alert and oriented to name only, cannot remember what happened or where he is, when asked with who and where he lives, he could not answer, moving all extremities weakly, does not have FROM independently, Precedex continued for aggitation/irritability/cooperation-tolerating weans well Resp: extubated to bipap to [place] 4L-tolerating very well, no desaturations, strong/productive/congested cough CV: afebrile, vital signs fairly stable, some hypotension-often resolved with time/stimulation/cuff adjustments-no medication intervention required GIGU: OG removed-Pharmacy contacted to adjust medications to IV if possible-Failed RN bedside swallow eval, water falls out of mouth/cannot seal lips when swallowing, MD aware-recommendation SLP consult in AM; foley in place, no BM Skin: right foot wound dressing/packing changed, otherwise dry/flaky Social: significant other visited this AM very briefly, no direct RN contact  Events: successful extubation   Problem: Clinical Measurements: Goal: Ability to maintain clinical measurements within normal limits will improve Outcome: Progressing Goal: Will remain free from infection Outcome: Progressing Goal: Diagnostic test results will improve Outcome: Progressing Goal: Respiratory complications will improve Outcome: Progressing Goal: Cardiovascular complication will be avoided Outcome: Progressing   Problem: Nutrition: Goal: Adequate nutrition will be maintained Outcome: Progressing   Problem: Safety: Goal: Non-violent Restraint(s) Outcome: Progressing

## 2020-12-30 NOTE — Progress Notes (Signed)
NAME:  Jeremy [name], MRN:  [id number], DOB:  12-12-1955, LOS: 8 ADMISSION DATE:  12/22/2020  BRIEF SYNOPSIS 65 y.o. male with significant past medical history of CAD s/p CABG, HTN, DM2, polysubstance abuse, and MRSA infection who presented to the ED on 12/23/2018 with chief complaints of shortness of breath and palpitation x1 week   ED Course: On arrival to the ED, he was afebrile with blood pressure (!) 131/178mm Hg and pulse rate (!) 128 beats/min, RR (!) 26. Sats 96% on RA. There were no focal neurological deficits; he was alert and oriented x4, and he did not demonstrate any memory deficits. Labs/Diagnostics BMP shows no significant electrolyte or metabolic derangements.  CBC shows no leukocytosis or acute anemia.  COVID and influenza test is negative EKG: Sinus tachycardia with a ventricular of 127, normal axis, unremarkable intervals with some T wave inversions nonspecific changes in lateral leads and some nonspecific changes in anterior leads without other clear evidence of acute ischemia. Troponin: 74 CXR: Mild congestive heart failure pulm edema without focal consolidation, effusion or pneumothorax. CTA Chest: acute moderate clot burden bilateral pulmonary emboli no saddle embolus.  Mild to moderate cardiomegaly.  No signs of right heart strain.  Normal RV/LV ratio Other Labs D-Dimer elevated at 2.9. Given positive finding of bilateral pulmonary embolus as above and elevated troponin concerning for NSTEMI, patient was started on heparin drip and admitted under hospitalist service for further management   Hospital Course: During the course of his admission patient remained visibly short of breath, tachypneic and tachycardic at rest.  Vascular surgeon was consulted who deemed him appropriate for pulmonary thrombectomy.  Patient underwent pulmonary thrombectomy on 12/24/2020.  Per nursing staff, following the thrombectomy procedure patient became agitated and irritable trying to leave AMA.   Security was called and patient subsequently received 0.5 mg IV Ativan, 5 mg IM Haldol, 5 mg IM Valium, 2 mg Versed, Zyprexa 10 mg IM with no effect.  He remained combative, violently agitated requiring multiple security at the bedside.  Patient was transferred to ICU for Precedex drip and PCCM consulted. He was intubated on 7/14.   Past Medical History  CAD s/p CABG, HTN, llDM, polysubstance abuse, and MRSA infection   Significant Hospital Events  7/11: Admitted to PCU with bilateral pulmonary embolism 7/13: S/p pulmonary thrombectomy 7/13: Patient transferred to ICU for severe agitation requiring Precedex drip.  PCCM consulted 12/25/20- patient was combative and fell out of bed with confusion and hallucinations, accusing staff of shooting him in abdomen and has labored breathing, elevated risk of cardiopulmonary arrest due to drug/alcohol withdrawal,  unable to protect airway 7/14 intubated,sedated, severe resp failure 7/15 remains intubated, severe resp failure, severe encephalopathy 7/16 failure to wean from vent, resp distress and encephlopathy 7/18 remains intubated on minimal vent settings 7/19 intubated, following commands, on minimal vent settings; trach aspirate pos for K pneumoniae   Consults:  PCCM Vascular Cardiology  Procedures:  7/13: S/p pulmonary thrombectomy 7/14: endotracheal intubation   Significant Diagnostic Tests:  7/11: Chest Xray>Mild congestive heart failure pulm edema without focal consolidation, effusion or pneumothorax. 7/11: CTA Chest>acute moderate clot burden bilateral pulmonary emboli no saddle embolus.  Mild to moderate cardiomegaly.  No signs of right heart strain.  Normal RV/LV ratio 7/15: Noncontrast CT head>Unchanged age indeterminate small vessel disease in the cerebral hemispheres, greater on the right. No new intracranial abnormality. 7/15: CT C-spine>No acute osseous abnormality identified in the cervical spine. Advanced cervical spine  degeneration at C5-C6 and C6-C7 with  suspected mild spinal stenosis. Micro Data:  7/11: SARS-CoV-2 PCR>> negative 7/11: Influenza PCR>> negative 7/17: Trach asp>> mod Klebsiella pneumoniae   Interim History / Subjective:  Remains intubated and sedated. RASS -1 this morning. He is following simple commands.  Essentially even fluid balance over last 24 hours. No longer in Afib, off amio and pressors. Trach aspirate growing K pneumoniae.  Objective   Blood pressure (!) 153/88, pulse (!) 117, temperature 98.2 F (36.8 C), resp. rate 20, height 5\' 11"  (1.803 m), weight 104.9 kg, SpO2 98 %.    Vent Mode: PRVC FiO2 (%):  [35 %] 35 % Set Rate:  [16 bmp] 16 bmp Vt Set:  [500 mL] 500 mL PEEP:  [8 cmH20] 8 cmH20   Intake/Output Summary (Last 24 hours) at 12/30/2020 0713 Last data filed at 12/30/2020 01/01/2021 Gross per 24 hour  Intake 1940.38 ml  Output 3675 ml  Net -1734.62 ml   Filed Weights   12/28/20 0500 12/29/20 0425 12/30/20 0500  Weight: 113 kg 116.6 kg 104.9 kg   REVIEW OF SYSTEMS  PATIENT IS UNABLE TO PROVIDE COMPLETE REVIEW OF SYSTEMS DUE TO SEVERE CRITICAL ILLNESS AND TOXIC METABOLIC ENCEPHALOPATHY   PHYSICAL EXAMINATION:  GENERAL: male in NAD, intubated and sedated HEAD: Normocephalic, atraumatic.  EYES: Pupils 25mm, equally reactive MOUTH: ETT in place PULMONARY: scattered rhonchi, minimal vent settings CARDIOVASCULAR: S1 and S2. Tachycardic No murmurs, rubs, or gallops.  GASTROINTESTINAL: Soft, nontender, non-distended. Positive bowel sounds.  MUSCULOSKELETAL: Trace BLE, no clubbing, or edema.  NEUROLOGIC: obtunded SKIN:intact,warm,dry   Labs/imaging that I havepersonally reviewed  (right click and "Reselect all SmartList Selections" daily)  I have reviewed all new relevant labs and imaging.    ASSESSMENT AND PLAN SYNOPSIS  65 yo Morbidly obese AAM with b/l PE with embolectomy with progressive hypoxia and severe toxic metabolic encephalopathy intubated for  inability to protect airway and acute hypoxic respiratory failure  ACUTE Hypoxic and Hypercapnic Respiratory Failure -continue Mechanical Ventilator support - SAT/SBT today; extubate to BiPAP if able -continue Bronchodilator Therapy -VAP/VENT bundle implementation -goal negative fluid balance -RASS goal 0 to -1 today -continue Precedex; wean fentanyl gtt  Klebsiella pneumonia -continue Zosyn -f/u culture sensitivities  Vent Mode: PRVC FiO2 (%):  [35 %] 35 % Set Rate:  [16 bmp] 16 bmp Vt Set:  [500 mL] 500 mL PEEP:  [8 cmH20] 8 cmH20  CARDIAC FAILURE- with intermittent AFIB B/L PE with acute cor pulmonale and diastolic heart failure. I do not see a formal report for the echocardiogram, however, EF appears to be significantly decreased (<25%).  Will consult Cardiology Increase Lopressor to 25 mg Q6H Continue heparin gtt while in ICU Lasix for goal negative daily fluid balance  SEVERE DRUG WITHDRAWAL -thiamine, folate, MVI -off continuous Versed; may require low dose BZ once off, will monitor for withdrawal sxs  CARDIAC ICU monitoring  ACUTE KIDNEY INJURY/Renal Failure -maintain Foley -Avoid nephrotoxic agents -Follow urine output, BMP -Ensure adequate renal perfusion, optimize oxygenation -Renal dose medications   Intake/Output Summary (Last 24 hours) at 12/30/2020 0713 Last data filed at 12/30/2020 01/01/2021 Gross per 24 hour  Intake 1940.38 ml  Output 3675 ml  Net -1734.62 ml     NEUROLOGY Acute toxic metabolic encephalopathy, improving MRI brain wnl Continue thiamine RASS goal 0   GI GI PROPHYLAXIS as indicated  NUTRITIONAL STATUS DIET-->TF's as tolerated Constipation protocol as indicated  ELECTROLYTES -follow labs as needed -replace as needed -pharmacy consultation and following    Best practice:  Diet:  continuous TF Pain/Anxiety/Delirium protocol (if indicated): Yes (RASS goal 0 to -1) VAP protocol (if indicated): per protocol DVT  prophylaxis: full dose heparin GI prophylaxis: PPI Glucose control:  SSI Yes Central venous access:  N/A Arterial line:  N/A Foley:  N/A Mobility:  bed rest  PT consulted: N/A Last date of multidisciplinary goals of care discussion [7/13] Code Status:  full code Disposition: ICU    Labs   CBC: Recent Labs  Lab 12/25/20 0808 12/26/20 0832 12/27/20 0502 12/28/20 0420 12/29/20 0430 12/30/20 0500  WBC 6.0 10.8* 7.9 6.9 7.6 8.0  NEUTROABS 3.2  --   --   --   --   --   HGB 13.9 13.1 11.5* 11.3* 10.5* 11.1*  HCT 42.4 39.7 35.4* 35.4* 32.9* 34.2*  MCV 95.7 96.6 97.8 99.7 99.7 98.8  PLT 178 210 182 182 165 208    Basic Metabolic Panel: Recent Labs  Lab 12/24/20 2232 12/25/20 0808 12/26/20 2259 12/27/20 0502 12/28/20 0420 12/28/20 1518 12/29/20 0430 12/30/20 0500  NA 140   < >  --  141 140 138 139 142  K 4.0   < >  --  4.4 4.9 4.7 4.8 3.9  CL 111   < >  --  110 108 107 110 106  CO2 23   < >  --  26 26 26 26 29   GLUCOSE 136*   < >  --  162* 207* 301* 209* 197*  BUN 20   < >  --  24* 24* 29* 35* 36*  CREATININE 1.26*   < >  --  1.52* 1.36* 1.42* 1.36* 1.29*  CALCIUM 9.1   < >  --  8.4* 8.8* 8.7* 8.9 9.1  MG 2.2  --  2.0 2.2  --   --  2.1 1.9  PHOS  --   --  4.9* 4.5 3.6  --  3.5 3.6   < > = values in this interval not displayed.   GFR: Estimated Creatinine Clearance: 72.2 mL/min (A) (by C-G formula based on SCr of 1.29 mg/dL (H)). Recent Labs  Lab 12/27/20 0502 12/28/20 0420 12/28/20 1518 12/29/20 0430 12/30/20 0500  PROCALCITON  --   --  <0.10 <0.10 <0.10  WBC 7.9 6.9  --  7.6 8.0    Liver Function Tests: No results for input(s): AST, ALT, ALKPHOS, BILITOT, PROT, ALBUMIN in the last 168 hours. No results for input(s): LIPASE, AMYLASE in the last 168 hours. No results for input(s): AMMONIA in the last 168 hours.  ABG    Component Value Date/Time   PHART 7.30 (L) 12/25/2020 1745   PCO2ART 40 12/25/2020 1745   PO2ART 82 (L) 12/25/2020 1745   HCO3 19.7  (L) 12/25/2020 1745   ACIDBASEDEF 6.3 (H) 12/25/2020 1745   O2SAT 94.7 12/25/2020 1745     Coagulation Profile: No results for input(s): INR, PROTIME in the last 168 hours.   Cardiac Enzymes: Recent Labs  Lab 12/26/20 2103  CKMB 3.0    HbA1C: Hemoglobin A1C  Date/Time Value Ref Range Status  10/03/2011 05:33 AM 6.2 4.2 - 6.3 % Final    Comment:    The American Diabetes Association recommends that a primary goal of therapy should be <7% and that physicians should reevaluate the treatment regimen in patients with HbA1c values consistently >8%.    Hgb A1c MFr Bld  Date/Time Value Ref Range Status  12/22/2020 10:57 PM 8.7 (H) 4.8 - 5.6 % Final    Comment:    (  NOTE) Pre diabetes:          5.7%-6.4%  Diabetes:              >6.4%  Glycemic control for   <7.0% adults with diabetes   08/20/2019 02:09 PM 13.5 (H) 4.8 - 5.6 % Final    Comment:    (NOTE) Pre diabetes:          5.7%-6.4% Diabetes:              >6.4% Glycemic control for   <7.0% adults with diabetes     CBG: Recent Labs  Lab 12/29/20 1133 12/29/20 1545 12/29/20 1953 12/29/20 2359 12/30/20 0418  GLUCAP 234* 202* 176* 170* 187*    Allergies Allergies  Allergen Reactions   Amoxicillin Swelling   Keflex [Cephalexin] Rash       DVT/GI PRX  assessed I Assessed the need for Labs I Assessed the need for Foley I Assessed the need for Central Venous Line Family Discussion when available I Assessed the need for Mobilization I made an Assessment of medications to be adjusted accordingly Safety Risk assessment completed  CASE DISCUSSED IN MULTIDISCIPLINARY ROUNDS WITH ICU TEAM   This patient is critically ill with multiple organ system failure; which, requires frequent high complexity decision making, assessment, support, evaluation, and titration of therapies. This was completed through the application of advanced monitoring technologies and extensive interpretation of multiple databases. During  this encounter critical care time was devoted to patient care services described in this note for 35 minutes.   Marcelo BaldyAdam Ross Shearon Clonch, MD 12/30/20 7:13 AM

## 2020-12-30 NOTE — Progress Notes (Signed)
Pt attempting to climb out of bed, pt redirected to stay in bed. Came in to reposition patient and pt had pulled out  picc line significantly. NP notified and informed nurse to remove picc line and utilize PIV.

## 2020-12-30 NOTE — Progress Notes (Signed)
Pt remains on charted vent support. No sig issues to note. Support apparatus secure. Pt to be evaluated for further weaning this am.

## 2020-12-31 ENCOUNTER — Other Ambulatory Visit: Payer: Self-pay

## 2020-12-31 DIAGNOSIS — Z87898 Personal history of other specified conditions: Secondary | ICD-10-CM | POA: Diagnosis not present

## 2020-12-31 DIAGNOSIS — J9601 Acute respiratory failure with hypoxia: Secondary | ICD-10-CM | POA: Diagnosis not present

## 2020-12-31 DIAGNOSIS — R410 Disorientation, unspecified: Secondary | ICD-10-CM | POA: Diagnosis not present

## 2020-12-31 DIAGNOSIS — I2699 Other pulmonary embolism without acute cor pulmonale: Secondary | ICD-10-CM | POA: Diagnosis not present

## 2020-12-31 LAB — BASIC METABOLIC PANEL
Anion gap: 12 (ref 5–15)
BUN: 31 mg/dL — ABNORMAL HIGH (ref 8–23)
CO2: 22 mmol/L (ref 22–32)
Calcium: 9.2 mg/dL (ref 8.9–10.3)
Chloride: 107 mmol/L (ref 98–111)
Creatinine, Ser: 0.89 mg/dL (ref 0.61–1.24)
GFR, Estimated: >60 mL/min (ref >60)
Glucose, Bld: 155 mg/dL — ABNORMAL HIGH (ref 70–99)
Potassium: 5 mmol/L (ref 3.5–5.1)
Sodium: 141 mmol/L (ref 135–145)

## 2020-12-31 LAB — MAGNESIUM: Magnesium: 2 mg/dL (ref 1.7–2.4)

## 2020-12-31 LAB — CULTURE, RESPIRATORY W GRAM STAIN

## 2020-12-31 LAB — GLUCOSE, CAPILLARY
Glucose-Capillary: 117 mg/dL — ABNORMAL HIGH (ref 70–99)
Glucose-Capillary: 131 mg/dL — ABNORMAL HIGH (ref 70–99)
Glucose-Capillary: 145 mg/dL — ABNORMAL HIGH (ref 70–99)
Glucose-Capillary: 155 mg/dL — ABNORMAL HIGH (ref 70–99)
Glucose-Capillary: 157 mg/dL — ABNORMAL HIGH (ref 70–99)
Glucose-Capillary: 165 mg/dL — ABNORMAL HIGH (ref 70–99)
Glucose-Capillary: 170 mg/dL — ABNORMAL HIGH (ref 70–99)

## 2020-12-31 LAB — PHOSPHORUS: Phosphorus: 3.4 mg/dL (ref 2.5–4.6)

## 2020-12-31 MED ORDER — QUETIAPINE FUMARATE 25 MG PO TABS
25.0000 mg | ORAL_TABLET | Freq: Every day | ORAL | Status: DC
  Administered 2020-12-31 – 2021-01-01 (×2): 25 mg via ORAL
  Filled 2020-12-31 (×2): qty 1

## 2020-12-31 MED ORDER — RIVAROXABAN 15 MG PO TABS
15.0000 mg | ORAL_TABLET | Freq: Every day | ORAL | Status: DC
  Administered 2020-12-31 – 2021-01-02 (×3): 15 mg via ORAL
  Filled 2020-12-31 (×3): qty 1

## 2020-12-31 MED ORDER — ASCORBIC ACID 500 MG PO TABS
250.0000 mg | ORAL_TABLET | Freq: Two times a day (BID) | ORAL | Status: DC
  Administered 2020-12-31 – 2021-01-03 (×6): 250 mg via ORAL
  Filled 2020-12-31 (×6): qty 1

## 2020-12-31 MED ORDER — PANTOPRAZOLE SODIUM 40 MG PO TBEC
40.0000 mg | DELAYED_RELEASE_TABLET | Freq: Every day | ORAL | Status: DC
  Administered 2021-01-01 – 2021-01-03 (×3): 40 mg via ORAL
  Filled 2020-12-31 (×3): qty 1

## 2020-12-31 MED ORDER — ENSURE ENLIVE PO LIQD
237.0000 mL | Freq: Three times a day (TID) | ORAL | Status: DC
  Administered 2020-12-31 – 2021-01-01 (×3): 237 mL via ORAL

## 2020-12-31 MED ORDER — RIVAROXABAN 20 MG PO TABS: 20.0000 mg | ORAL_TABLET | Freq: Every day | ORAL | Status: DC

## 2020-12-31 MED ORDER — METOPROLOL TARTRATE 25 MG PO TABS
25.0000 mg | ORAL_TABLET | Freq: Four times a day (QID) | ORAL | Status: DC
  Administered 2020-12-31 – 2021-01-02 (×8): 25 mg via ORAL
  Filled 2020-12-31 (×8): qty 1

## 2020-12-31 NOTE — Progress Notes (Signed)
[facility] Encounter Note  Patient: Jeremy [name] / Admit Date: 12/22/2020 / Date of Encounter: 12/31/2020, 2:43 PM   Subjective: 65 year old male patient with a past medical history of CAD s/p CABG, hypertension, type 2 diabetes, polysubstance abuse who was present in the ED on 12/23/2018 and diagnosed with bilateral pulmonary embolism.  At that time the patient was noted to be in sinus tachycardia with a ventricular rate of 127 bpm and a troponin of 74.  Patient was diagnosed with bilateral pulmonary embolism by CTA with no evidence of right heart strain.  Patient underwent pulmonary thrombectomy on 7/13 after which he developed severe agitation and toxic metabolic encephalopathy.  Echocardiogram on 12/24/2020 showed severely decreased left ventricular function with an estimated ejection fraction of less than 20% with global left ventricular hypokinesis.  Patient was also reported to have intermittent atrial fibrillation for which she has been placed on Lopressor for rate control.  Patient remains on heparin drip.  Patient is not currently able to participate in visit and answer questions secondary to encephalopathy and severe drug withdrawals on sedatives.  Review of Systems: Patient is unable to complete a review of systems due to severe critical illness and toxic metabolic encephalopathy   Objective: Telemetry: Sinus tachycardia Physical Exam: Blood pressure (!) 122/91, pulse (!) 106, temperature (!) 97.2 F (36.2 C), temperature source Oral, resp. rate 18, height _0  (1.803 m), weight 104.9 kg, SpO2 100 %. Body mass index is 32.25 kg/m. General: Male patient in no acute distress, sedated Head: Normocephalic, atraumatic, sclera non-icteric, no xanthomas, nares are without discharge. Neck: No apparent masses Lungs: Normal respirations with no wheezes, no rhonchi, no rales , no crackles   Heart: Tachycardic rate and regular rhythm, normal S1 S2, no murmur, no  rub, no gallop, PMI is normal size and placement, carotid upstroke normal without bruit, jugular venous pressure normal Abdomen: Soft, non-tender, non-distended with normoactive bowel sounds. No hepatosplenomegaly. Abdominal aorta is normal size without bruit Extremities: Trace edema, no clubbing, no cyanosis, no ulcers,  Peripheral: 2+ radial, 2+ femoral, 2+ dorsal pedal pulses     Intake/Output Summary (Last 24 hours) at 12/31/2020 1443 Last data filed at 12/31/2020 1300 Gross per 24 hour  Intake 1810.9 ml  Output 845 ml  Net 965.9 ml    Inpatient Medications:   vitamin C  250 mg Oral BID   chlorhexidine gluconate (MEDLINE KIT)  15 mL Mouth Rinse BID   Chlorhexidine Gluconate Cloth  6 each Topical Daily   feeding supplement  237 mL Oral TID BM   folic acid  1 mg Intravenous Daily   insulin aspart  0-20 Units Subcutaneous Q4H   levalbuterol  0.63 mg Nebulization TID   metoprolol tartrate  25 mg Oral Q6H   multivitamin with minerals  1 tablet Per Tube Daily   nicotine  21 mg Transdermal Daily   pantoprazole  40 mg Oral Daily   QUEtiapine  25 mg Oral QHS   rivaroxaban  15 mg Oral Daily   Followed by   Derrill Memo ON 01/21/2021] rivaroxaban  20 mg Oral Q supper   sodium chloride flush  10-40 mL Intracatheter Q12H   thiamine  100 mg Oral Daily   Or   thiamine  100 mg Intravenous Daily   Infusions:    ceFAZolin (ANCEF) IV Stopped (12/31/20 0531)   dexmedetomidine (PRECEDEX) IV infusion Stopped (12/31/20 1127)    Labs: Recent Labs    12/30/20 0500 12/31/20 1154  NA 142 141  K 3.9 5.0  CL 106 107  CO2 29 22  GLUCOSE 197* 155*  BUN 36* 31*  CREATININE 1.29* 0.89  CALCIUM 9.1 9.2  MG 1.9 2.0  PHOS 3.6 3.4   No results for input(s): AST, ALT, ALKPHOS, BILITOT, PROT, ALBUMIN in the last 72 hours. Recent Labs    12/29/20 0430 12/30/20 0500  WBC 7.6 8.0  HGB 10.5* 11.1*  HCT 32.9* 34.2*  MCV 99.7 98.8  PLT 165 208   No results for input(s): CKTOTAL, CKMB, TROPONINI in  the last 72 hours. Invalid input(s): POCBNP No results for input(s): HGBA1C in the last 72 hours.   Weights: Filed Weights   12/28/20 0500 12/29/20 0425 12/30/20 0500  Weight: 113 kg 116.6 kg 104.9 kg     Radiology/Studies:  DG Chest 2 View  Result Date: 12/22/2020 CLINICAL DATA:  Dyspnea, palpitations EXAM: CHEST - 2 VIEW COMPARISON:  02/12/2018 chest radiograph. FINDINGS: Intact sternotomy wires. Stable cardiomediastinal silhouette with mild cardiomegaly. No pneumothorax. No pleural effusion. Mild pulmonary edema. No acute consolidative airspace disease. IMPRESSION: Mild congestive heart failure. Electronically Signed   By: Ilona Sorrel M.D.   On: 12/22/2020 14:54   DG Abd 1 View  Result Date: 12/25/2020 CLINICAL DATA:  Check gastric catheter placement EXAM: ABDOMEN - 1 VIEW COMPARISON:  None. FINDINGS: Gastric catheter is noted within the stomach. Scattered large and small bowel gas is noted. No free air is seen. IMPRESSION: Gastric catheter within the stomach. Electronically Signed   By: Inez Catalina M.D.   On: 12/25/2020 18:35   CT HEAD WO CONTRAST  Result Date: 12/26/2020 CLINICAL DATA:  65 year old male status post acute pulmonary emboli. Altered mental status with age indeterminate hypodensity in the right caudate on head CT yesterday. Found with altered mental status now. Intubated. EXAM: CT HEAD WITHOUT CONTRAST TECHNIQUE: Contiguous axial images were obtained from the base of the skull through the vertex without intravenous contrast. COMPARISON:  Head CT 12/25/2020. FINDINGS: Brain: Unchanged asymmetric hypodensity in the right caudate (series 6, image 16) and patchy bilateral cerebral white matter hypodensity since yesterday. Focal cystic encephalomalacia near the body of the right corpus callosum is stable on sagittal image 25. Small chronic appearing lacunar infarct of the right cerebellum on series 6, image 23 is stable. No midline shift, ventriculomegaly, mass effect, evidence  of mass lesion, intracranial hemorrhage or evidence of cortically based acute infarction. No cortical encephalomalacia identified. Vascular: Calcified atherosclerosis at the skull base. No suspicious intracranial vascular hyperdensity. Skull: Stable and intact. Sinuses/Orbits: Visualized paranasal sinuses and mastoids are stable and well aerated. Other: No acute orbit or scalp soft tissue finding. Fluid in the visible pharynx related to intubation. IMPRESSION: 1. Unchanged age indeterminate small vessel disease in the cerebral hemispheres, greater on the right. 2. No new intracranial abnormality. Electronically Signed   By: Genevie Ann M.D.   On: 12/26/2020 04:33   CT HEAD WO CONTRAST  Result Date: 12/25/2020 CLINICAL DATA:  Initial evaluation for altered mental status, agitation. Prior pulmonary thrombectomy. EXAM: CT HEAD WITHOUT CONTRAST TECHNIQUE: Contiguous axial images were obtained from the base of the skull through the vertex without intravenous contrast. COMPARISON:  None available. FINDINGS: Brain: Generalized age-related cerebral atrophy. Patchy and confluent hypodensity involving the periventricular and deep white matter both cerebral hemispheres, most consistent with chronic small vessel ischemic disease, moderately advanced in nature. Few scattered superimposed remote lacunar infarcts present about the hemispheric cerebral white matter. Few small remote bilateral cerebellar infarcts noted. Approximate 1.5 cm  hypodensity noted involving the anterior right caudate/internal capsule, somewhat age indeterminate, and could be chronic (series 3, image 6). No other evidence for acute large vessel territory infarct. No acute intracranial hemorrhage. No mass lesion or midline shift. No hydrocephalus or extra-axial fluid collection. Vascular: No hyperdense vessel. Calcified atherosclerosis present at the skull base. Skull: Scalp soft tissues within normal limits.  Calvarium intact. Sinuses/Orbits: Globes and  orbital soft tissues demonstrate no acute finding. Paranasal sinuses are largely clear. No mastoid effusion. Other: None. IMPRESSION: 1. 1.5 cm hypodensity involving the anterior right caudate/internal capsule, indeterminate. While this finding may be chronic in nature, possible acute to subacute ischemic change is difficult to exclude. If there is clinical concern for possible acute infarct at this location, this could be further assessed with dedicated MRI as clinically warranted. 2. No other acute intracranial abnormality. 3. Generalized age-related cerebral atrophy with moderate chronic small vessel ischemic disease, with a few additional scattered remote lacunar infarcts as above. Electronically Signed   By: Jeannine Boga M.D.   On: 12/25/2020 03:22   CT Angio Chest PE W and/or Wo Contrast  Result Date: 12/22/2020 CLINICAL DATA:  Palpitations, chronic cough EXAM: CT ANGIOGRAPHY CHEST WITH CONTRAST TECHNIQUE: Multidetector CT imaging of the chest was performed using the standard protocol during bolus administration of intravenous contrast. Multiplanar CT image reconstructions and MIPs were obtained to evaluate the vascular anatomy. CONTRAST:  19m OMNIPAQUE IOHEXOL 350 MG/ML SOLN COMPARISON:  Chest radiograph from earlier today. 10/02/2011 chest CT. FINDINGS: Cardiovascular: The study is high quality for the evaluation of pulmonary embolism. No saddle embolus. Acute moderate clot burden bilateral pulmonary emboli including segmental and subsegmental left upper and left lower lobe branches, distal right pulmonary artery and lobar, segmental and subsegmental right upper lobe branches. Atherosclerotic nonaneurysmal thoracic aorta. Top-normal caliber main pulmonary artery (3.1 cm diameter). Mild-to-moderate cardiomegaly. No significant pericardial fluid/thickening. RV/LV ratio 0.76, within normal limits. Mediastinum/Nodes: No discrete thyroid nodules. Unremarkable esophagus. No pathologically enlarged  axillary, mediastinal or hilar lymph nodes. Lungs/Pleura: No pneumothorax. No pleural effusion. A small portion of the dependent right lung base was inadvertently excluded from the scan. Posterior right middle lobe 1.0 cm solid pulmonary nodule (series 6/image 62), not substantially changed since 02/10/2018 CT abdomen study, considered benign. No acute consolidative airspace disease, lung masses or additional significant pulmonary nodules. Upper abdomen: Mild contrast reflux into the IVC and hepatic veins. Musculoskeletal: No aggressive appearing focal osseous lesions. Intact sternotomy wires. Mild thoracic spondylosis. Review of the MIP images confirms the above findings. IMPRESSION: 1. Acute moderate clot burden bilateral pulmonary emboli, extending proximally to the distal right pulmonary artery level. No saddle embolus. Normal RV/LV ratio. 2. Mild-to-moderate cardiomegaly. Mild contrast reflux into the IVC and hepatic veins. 3. Aortic Atherosclerosis (ICD10-I70.0). Critical Value/emergent results were called by telephone at the time of interpretation on 12/22/2020 at 4:57 pm to provider EValora Piccolo MD, who verbally acknowledged these results. Electronically Signed   By: JIlona SorrelM.D.   On: 12/22/2020 17:00   CT CERVICAL SPINE WO CONTRAST  Result Date: 12/26/2020 CLINICAL DATA:  65year old male status post acute pulmonary emboli. Altered mental status with age indeterminate hypodensity in the right caudate on head CT yesterday. Found with altered mental status now. Intubated. EXAM: CT CERVICAL SPINE WITHOUT CONTRAST TECHNIQUE: Multidetector CT imaging of the cervical spine was performed without intravenous contrast. Multiplanar CT image reconstructions were also generated. COMPARISON:  Head CT today. FINDINGS: Alignment: Mild reversal of cervical lordosis. Cervicothoracic junction alignment is within  normal limits. Bilateral posterior element alignment is within normal limits. Skull base and vertebrae:  Visualized skull base is intact. No atlanto-occipital dissociation. C1 and C2 appear intact and aligned. No acute osseous abnormality identified. Soft tissues and spinal canal: No prevertebral fluid or swelling. No visible canal hematoma. Intubated and enteric tube in place. Bubbly opacity in the pharynx. Bilateral calcified carotid artery atherosclerosis in the neck. Disc levels: Moderate to severe cervical disc and endplate degeneration B7-S2 and C6-C7. Evidence of mild spinal stenosis at those levels. Mild to moderate degeneration elsewhere. Upper chest: T1 level appears intact. Negative visible lung apices. The endotracheal and enteric tubes course appropriately into the trachea and esophagus, respectively. IMPRESSION: 1. No acute osseous abnormality identified in the cervical spine. 2. 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 3. Satisfactory visible endotracheal and enteric tubes. Electronically Signed   By: Genevie Ann M.D.   On: 12/26/2020 04:36   MR BRAIN WO CONTRAST  Result Date: 12/27/2020 CLINICAL DATA:  Stroke follow-up EXAM: MRI HEAD WITHOUT CONTRAST TECHNIQUE: Multiplanar, multiecho pulse sequences of the brain and surrounding structures were obtained without intravenous contrast. COMPARISON:  CT head 12/26/2020 FINDINGS: Brain: Negative for acute infarct. Moderate chronic microvascular ischemic change throughout the white matter bilaterally. Small chronic infarcts in the cerebellum bilaterally. Negative for mass lesion. Chronic microhemorrhage in the left frontal white matter. No fluid collection or midline shift. Vascular: Normal arterial flow voids Skull and upper cervical spine: Negative Sinuses/Orbits: Negative Other: None IMPRESSION: Negative for acute infarct. Moderate chronic microvascular ischemic change. Electronically Signed   By: Franchot Gallo M.D.   On: 12/27/2020 12:58   US RENAL  Result Date: 12/27/2020 CLINICAL DATA:  Acute kidney  injury. EXAM: RENAL / URINARY TRACT ULTRASOUND COMPLETE COMPARISON:  Abdomen and pelvis CT dated 02/10/2018 FINDINGS: Right Kidney: Renal measurements: 9.0 x 5.2 x 4.1 cm = volume: 100 mL. Echogenicity within normal limits. No mass or hydronephrosis visualized. Left Kidney: Renal measurements: 11.6 x 6.3 x 5.7 cm = volume: 219 mL. Echogenicity within normal limits. No mass or hydronephrosis visualized. Bladder: No urine in the bladder with a Foley catheter in place. Other: None. IMPRESSION: Normal examination. Electronically Signed   By: Claudie Revering M.D.   On: 12/27/2020 11:36   PERIPHERAL VASCULAR CATHETERIZATION  Result Date: 12/24/2020 See surgical note for result.  US Venous Img Lower Bilateral (DVT)  Result Date: 12/23/2020 CLINICAL DATA:  Known pulmonary emboli. EXAM: BILATERAL LOWER EXTREMITY VENOUS DOPPLER ULTRASOUND TECHNIQUE: Gray-scale sonography with graded compression, as well as color Doppler and duplex ultrasound were performed to evaluate the lower extremity deep venous systems from the level of the common femoral vein and including the common femoral, femoral, profunda femoral, popliteal and calf veins including the posterior tibial, peroneal and gastrocnemius veins when visible. The superficial great saphenous vein was also interrogated. Spectral Doppler was utilized to evaluate flow at rest and with distal augmentation maneuvers in the common femoral, femoral and popliteal veins. COMPARISON:  None. FINDINGS: RIGHT LOWER EXTREMITY Common Femoral Vein: No evidence of thrombus. Normal compressibility, respiratory phasicity and response to augmentation. Saphenofemoral Junction: No evidence of thrombus. Normal compressibility and flow on color Doppler imaging. Profunda Femoral Vein: No evidence of thrombus. Normal compressibility and flow on color Doppler imaging. Femoral Vein: No evidence of thrombus. Normal compressibility, respiratory phasicity and response to augmentation. Popliteal Vein:  No evidence of thrombus. Normal compressibility, respiratory phasicity and response to augmentation. Calf Veins: No evidence of thrombus.  Normal compressibility and flow on color Doppler imaging. Superficial Great Saphenous Vein: No evidence of thrombus. Normal compressibility. Venous Reflux:  None. Other Findings:  None. LEFT LOWER EXTREMITY Common Femoral Vein: No evidence of thrombus. Normal compressibility, respiratory phasicity and response to augmentation. Saphenofemoral Junction: No evidence of thrombus. Normal compressibility and flow on color Doppler imaging. Profunda Femoral Vein: No evidence of thrombus. Normal compressibility and flow on color Doppler imaging. Femoral Vein: No evidence of thrombus. Normal compressibility, respiratory phasicity and response to augmentation. Popliteal Vein: No evidence of thrombus. Normal compressibility, respiratory phasicity and response to augmentation. Calf Veins: No evidence of thrombus. Normal compressibility and flow on color Doppler imaging. Superficial Great Saphenous Vein: No evidence of thrombus. Normal compressibility. Venous Reflux:  None. Other Findings:  None. IMPRESSION: No evidence of deep venous thrombosis in either lower extremity. Electronically Signed   By: Inez Catalina M.D.   On: 12/23/2020 02:56   DG Chest Port 1 View  Result Date: 12/29/2020 CLINICAL DATA:  Acute respiratory failure EXAM: PORTABLE CHEST 1 VIEW COMPARISON:  12/26/2020 FINDINGS: Endotracheal tube 6.8 cm above the carina. Esophageal Doppler POBA within the expected mid esophagus. Nasogastric tube extends into the upper abdomen beyond the margin of the examination. Right upper extremity PICC line tip noted within the superior vena cava. Pulmonary insufflation is stable. Lungs are clear. No pneumothorax. Possible tiny right pleural effusion. Coronary artery bypass grafting has been performed. Mild cardiomegaly is stable. There is central pulmonary vascular engorgement without overt  pulmonary edema. IMPRESSION: Stable support lines and tubes. Stable pulmonary insufflation. Stable cardiomegaly. Engorgement of the central pulmonary vessels in keeping with mild cardiogenic failure or volume overload. Electronically Signed   By: Fidela Salisbury MD   On: 12/29/2020 04:14   DG Chest Port 1 View  Result Date: 12/26/2020 CLINICAL DATA:  Status post peripherally inserted central catheter (PICC) central line placement. EXAM: PORTABLE CHEST 1 VIEW COMPARISON:  Prior chest radiographs 12/25/2020 and earlier. FINDINGS: A right-sided PICC is now present with tip projecting at the level of the lower SVC. The ET tube terminates just below the level of the clavicular heads. An enteric tube passes below the level of the left hemidiaphragm with tip excluded from the field of view. Prior median sternotomy and CABG. Unchanged cardiomegaly. Central pulmonary vascular congestion. Minimal atelectasis at the right lung base. No sizable pleural effusion or evidence of pneumothorax. No acute bony abnormality identified. IMPRESSION: Right-sided PICC with tip projecting at the level of the lower SVC. Position of additional lines/tubes, as described. Unchanged cardiomegaly with central pulmonary vascular congestion. Minimal atelectasis at the right lung base. Electronically Signed   By: Kellie Simmering DO   On: 12/26/2020 13:36   DG Chest Port 1 View  Result Date: 12/25/2020 CLINICAL DATA:  Status post intubation, initial encounter EXAM: PORTABLE CHEST 1 VIEW COMPARISON:  12/22/2020 FINDINGS: Endotracheal tube is noted in satisfactory position at the level of the aortic arch. Gastric catheter extends into the stomach. Postsurgical changes are again seen. Cardiomegaly is again noted. Mild linear atelectasis is noted in the mid lungs bilaterally stable from the prior exam. No sizable effusion is seen. Mild vascular congestion is seen. IMPRESSION: Mild vascular congestion stable from the prior exam. Tubes and lines as  described. Electronically Signed   By: Inez Catalina M.D.   On: 12/25/2020 18:35   [place] EKG SITE RITE  Result Date: 12/26/2020 If Site Rite image not attached, placement could not be confirmed due to current cardiac rhythm.  Assessment and Recommendation  65 y.o. male with acute diastolic heart failure with reduced ejection fraction less than 25%, intermittent atrial fibrillation, sinus tachycardia -Continue metoprolol transitioning to oral dose as appropriate once patient has passed swallow evaluation -May consider increasing metoprolol if blood pressure allows for more adequate rate control. -Continue anticoagulation for stroke risk reduction with atrial fibrillation -Continue Lasix as needed for pulmonary edema with goal negative daily fluid balance.  Signed, Jettie Booze PA-C

## 2020-12-31 NOTE — Progress Notes (Signed)
NAME:  Jeremy [name], MRN:  [id number], DOB:  10/07/1955, LOS: 9 ADMISSION DATE:  12/22/2020  BRIEF SYNOPSIS 65 y.o. male with significant past medical history of CAD s/p CABG, HTN, DM2, polysubstance abuse, and MRSA infection who presented to the ED on 12/23/2018 with chief complaints of shortness of breath and palpitation x1 week   ED Course: On arrival to the ED, he was afebrile with blood pressure (!) 131/17015mm Hg and pulse rate (!) 128 beats/min, RR (!) 26. Sats 96% on RA. There were no focal neurological deficits; he was alert and oriented x4, and he did not demonstrate any memory deficits. Labs/Diagnostics BMP shows no significant electrolyte or metabolic derangements.  CBC shows no leukocytosis or acute anemia.  COVID and influenza test is negative EKG: Sinus tachycardia with a ventricular of 127, normal axis, unremarkable intervals with some T wave inversions nonspecific changes in lateral leads and some nonspecific changes in anterior leads without other clear evidence of acute ischemia. Troponin: 74 CXR: Mild congestive heart failure pulm edema without focal consolidation, effusion or pneumothorax. CTA Chest: acute moderate clot burden bilateral pulmonary emboli no saddle embolus.  Mild to moderate cardiomegaly.  No signs of right heart strain.  Normal RV/LV ratio Other Labs D-Dimer elevated at 2.9. Given positive finding of bilateral pulmonary embolus as above and elevated troponin concerning for NSTEMI, patient was started on heparin drip and admitted under hospitalist service for further management   Hospital Course: During the course of his admission patient remained visibly short of breath, tachypneic and tachycardic at rest.  Vascular surgeon was consulted who deemed him appropriate for pulmonary thrombectomy.  Patient underwent pulmonary thrombectomy on 12/24/2020.  Per nursing staff, following the thrombectomy procedure patient became agitated and irritable trying to leave AMA.   Security was called and patient subsequently received 0.5 mg IV Ativan, 5 mg IM Haldol, 5 mg IM Valium, 2 mg Versed, Zyprexa 10 mg IM with no effect.  He remained combative, violently agitated requiring multiple security at the bedside.  Patient was transferred to ICU for Precedex drip and PCCM consulted. He was intubated on 7/14.   Past Medical History  CAD s/p CABG, HTN, llDM, polysubstance abuse, and MRSA infection   Significant Hospital Events  7/11: Admitted to PCU with bilateral pulmonary embolism 7/13: S/p pulmonary thrombectomy 7/13: Patient transferred to ICU for severe agitation requiring Precedex drip.  PCCM consulted 12/25/20- patient was combative and fell out of bed with confusion and hallucinations, accusing staff of shooting him in abdomen and has labored breathing, elevated risk of cardiopulmonary arrest due to drug/alcohol withdrawal,  unable to protect airway 7/14 intubated,sedated, severe resp failure 7/15 remains intubated, severe resp failure, severe encephalopathy 7/16 failure to wean from vent, resp distress and encephlopathy 7/18 remains intubated on minimal vent settings 7/19 intubated, following commands, on minimal vent settings; trach aspirate pos for K pneumoniae 7/20 extubated yesterday, failed bedside swallow   Consults:  PCCM Vascular Cardiology  Procedures:  7/13: S/p pulmonary thrombectomy 7/14: endotracheal intubation   Significant Diagnostic Tests:  7/11: Chest Xray>Mild congestive heart failure pulm edema without focal consolidation, effusion or pneumothorax. 7/11: CTA Chest>acute moderate clot burden bilateral pulmonary emboli no saddle embolus.  Mild to moderate cardiomegaly.  No signs of right heart strain.  Normal RV/LV ratio 7/15: Noncontrast CT head>Unchanged age indeterminate small vessel disease in the cerebral hemispheres, greater on the right. No new intracranial abnormality. 7/15: CT C-spine>No acute osseous abnormality identified in the  cervical spine. 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 Micro Data:  7/11: SARS-CoV-2 PCR>> negative 7/11: Influenza PCR>> negative 7/17: Trach asp>> mod Klebsiella pneumoniae  Interim History / Subjective:  Extubated yesterday. Minor agitation yesterday requiring Haldol x1. Precedex gtt remained on following intubation. Failed bedside swallow; awaiting Speech Therapy for swallow evaluation. He responded well to Lasix IV x1 yesterday.  Objective   Blood pressure 133/83, pulse 97, temperature 98 F (36.7 C), resp. rate 10, height 5\' 11"  (1.803 m), weight 104.9 kg, SpO2 99 %.    Vent Mode: PSV FiO2 (%):  [35 %] 35 % PEEP:  [5 cmH20] 5 cmH20 Pressure Support:  [5 cmH20] 5 cmH20   Intake/Output Summary (Last 24 hours) at 12/31/2020 0719 Last data filed at 12/31/2020 0600 Gross per 24 hour  Intake 1146.31 ml  Output 2895 ml  Net -1748.69 ml   Filed Weights   12/28/20 0500 12/29/20 0425 12/30/20 0500  Weight: 113 kg 116.6 kg 104.9 kg   REVIEW OF SYSTEMS  PATIENT IS UNABLE TO PROVIDE COMPLETE REVIEW OF SYSTEMS DUE TO SEVERE CRITICAL ILLNESS AND TOXIC METABOLIC ENCEPHALOPATHY   PHYSICAL EXAMINATION:  GENERAL: male in NAD, intubated and sedated HEAD: Normocephalic, atraumatic.  EYES: Pupils 50mm, equally reactive PULMONARY: scattered rhonchi CARDIOVASCULAR: S1 and S2. Tachycardic No murmurs, rubs, or gallops.  GASTROINTESTINAL: Soft, nontender, non-distended. Positive bowel sounds.  MUSCULOSKELETAL: Trace BLE, no clubbing, or edema.  NEUROLOGIC: awake, conversant, following commands SKIN:intact,warm,dry   Labs/imaging that I havepersonally reviewed  (right click and "Reselect all SmartList Selections" daily)  I have reviewed all new relevant labs and imaging.  ASSESSMENT AND PLAN SYNOPSIS  65 yo Morbidly obese AAM with b/l PE with embolectomy with progressive hypoxia and severe toxic metabolic encephalopathy intubated for  inability to protect airway and acute hypoxic respiratory failure  ACUTE Hypoxic and Hypercapnic Respiratory Failure -extubated 7/20, wean FiO2 for target SpO2 >/=90% -continue Bronchodilator Therapy -goal negative fluid balance  Klebsiella pneumonia -Ancef for 7 day course  Vent Mode: PSV FiO2 (%):  [35 %] 35 % PEEP:  [5 cmH20] 5 cmH20 Pressure Support:  [5 cmH20] 5 cmH20  CARDIAC FAILURE- with intermittent AFIB B/L PE with acute cor pulmonale and diastolic heart failure. I do not see a formal report for the echocardiogram, however, EF appears to be significantly decreased (<25%).  -Cardiology following, appreciate input -Resume beta blockade when able to swallow -Continue heparin gtt while in ICU -Lasix for goal negative daily fluid balance  AGITATION SEVERE DRUG WITHDRAWAL -wean Precedex; Haldol PRN -thiamine, folate, MVI  CARDIAC ICU monitoring  ACUTE KIDNEY INJURY/Renal Failure -maintain Foley -Avoid nephrotoxic agents -Follow urine output, BMP -Ensure adequate renal perfusion, optimize oxygenation -Renal dose medications   Intake/Output Summary (Last 24 hours) at 12/31/2020 0719 Last data filed at 12/31/2020 0600 Gross per 24 hour  Intake 1146.31 ml  Output 2895 ml  Net -1748.69 ml     NEUROLOGY Acute toxic metabolic encephalopathy, improving MRI brain wnl Continue thiamine  GI GI PROPHYLAXIS as indicated  NUTRITIONAL STATUS DIET-->NPO, awaiting swallow eval Constipation protocol as indicated  ELECTROLYTES -follow labs as needed -replace as needed -pharmacy consultation and following    Best practice:  Diet:  NPO Pain/Anxiety/Delirium protocol (if indicated): N/A VAP protocol (if indicated): N/A DVT prophylaxis: full dose heparin GI prophylaxis: PPI Glucose control:  SSI Yes Central venous access:  N/A Arterial line:  N/A Foley:  N/A Mobility:  ad lib PT consulted: N/A Last date of multidisciplinary goals of care  discussion  [7/19] Code Status:  full code Disposition: ICU    Labs   CBC: Recent Labs  Lab 12/25/20 0808 12/26/20 0832 12/27/20 0502 12/28/20 0420 12/29/20 0430 12/30/20 0500  WBC 6.0 10.8* 7.9 6.9 7.6 8.0  NEUTROABS 3.2  --   --   --   --   --   HGB 13.9 13.1 11.5* 11.3* 10.5* 11.1*  HCT 42.4 39.7 35.4* 35.4* 32.9* 34.2*  MCV 95.7 96.6 97.8 99.7 99.7 98.8  PLT 178 210 182 182 165 208    Basic Metabolic Panel: Recent Labs  Lab 12/24/20 2232 12/25/20 0808 12/26/20 2259 12/27/20 0502 12/28/20 0420 12/28/20 1518 12/29/20 0430 12/30/20 0500  NA 140   < >  --  141 140 138 139 142  K 4.0   < >  --  4.4 4.9 4.7 4.8 3.9  CL 111   < >  --  110 108 107 110 106  CO2 23   < >  --  26 26 26 26 29   GLUCOSE 136*   < >  --  162* 207* 301* 209* 197*  BUN 20   < >  --  24* 24* 29* 35* 36*  CREATININE 1.26*   < >  --  1.52* 1.36* 1.42* 1.36* 1.29*  CALCIUM 9.1   < >  --  8.4* 8.8* 8.7* 8.9 9.1  MG 2.2  --  2.0 2.2  --   --  2.1 1.9  PHOS  --   --  4.9* 4.5 3.6  --  3.5 3.6   < > = values in this interval not displayed.   GFR: Estimated Creatinine Clearance: 72.2 mL/min (A) (by C-G formula based on SCr of 1.29 mg/dL (H)). Recent Labs  Lab 12/27/20 0502 12/28/20 0420 12/28/20 1518 12/29/20 0430 12/30/20 0500  PROCALCITON  --   --  <0.10 <0.10 <0.10  WBC 7.9 6.9  --  7.6 8.0    Liver Function Tests: No results for input(s): AST, ALT, ALKPHOS, BILITOT, PROT, ALBUMIN in the last 168 hours. No results for input(s): LIPASE, AMYLASE in the last 168 hours. No results for input(s): AMMONIA in the last 168 hours.  ABG    Component Value Date/Time   PHART 7.30 (L) 12/25/2020 1745   PCO2ART 40 12/25/2020 1745   PO2ART 82 (L) 12/25/2020 1745   HCO3 19.7 (L) 12/25/2020 1745   ACIDBASEDEF 6.3 (H) 12/25/2020 1745   O2SAT 94.7 12/25/2020 1745     Coagulation Profile: No results for input(s): INR, PROTIME in the last 168 hours.   Cardiac Enzymes: Recent Labs  Lab 12/26/20 2103   CKMB 3.0    HbA1C: Hemoglobin A1C  Date/Time Value Ref Range Status  10/03/2011 05:33 AM 6.2 4.2 - 6.3 % Final    Comment:    The American Diabetes Association recommends that a primary goal of therapy should be <7% and that physicians should reevaluate the treatment regimen in patients with HbA1c values consistently >8%.    Hgb A1c MFr Bld  Date/Time Value Ref Range Status  12/22/2020 10:57 PM 8.7 (H) 4.8 - 5.6 % Final    Comment:    (NOTE) Pre diabetes:          5.7%-6.4%  Diabetes:              >6.4%  Glycemic control for   <7.0% adults with diabetes   08/20/2019 02:09 PM 13.5 (H) 4.8 - 5.6 % Final    Comment:    (  NOTE) Pre diabetes:          5.7%-6.4% Diabetes:              >6.4% Glycemic control for   <7.0% adults with diabetes     CBG: Recent Labs  Lab 12/30/20 1129 12/30/20 1543 12/30/20 1942 12/31/20 0030 12/31/20 0416  GLUCAP 197* 201* 120* 155* 165*    Allergies Allergies  Allergen Reactions   Amoxicillin Swelling   Keflex [Cephalexin] Rash       DVT/GI PRX  assessed I Assessed the need for Labs I Assessed the need for Foley I Assessed the need for Central Venous Line Family Discussion when available I Assessed the need for Mobilization I made an Assessment of medications to be adjusted accordingly Safety Risk assessment completed  CASE DISCUSSED IN MULTIDISCIPLINARY ROUNDS WITH ICU TEAM   This patient is critically ill with multiple organ system failure; which, requires frequent high complexity decision making, assessment, support, evaluation, and titration of therapies. This was completed through the application of advanced monitoring technologies and extensive interpretation of multiple databases. During this encounter critical care time was devoted to patient care services described in this note for 35 minutes.   Marcelo Baldy, MD 12/31/20 7:19 AM

## 2020-12-31 NOTE — Consult Note (Signed)
PHARMACY CONSULT NOTE  Pharmacy Consult for Electrolyte Monitoring and Replacement   Recent Labs: Potassium (mmol/L)  Date Value  12/31/2020 5.0  10/03/2011 3.8   Magnesium (mg/dL)  Date Value  53/66/4403 2.0   Calcium (mg/dL)  Date Value  47/42/5956 9.2   Calcium, Total (mg/dL)  Date Value  38/75/6433 8.7   Albumin (g/dL)  Date Value  29/51/8841 4.1  10/02/2011 3.6   Phosphorus (mg/dL)  Date Value  66/11/3014 3.4   Sodium (mmol/L)  Date Value  12/31/2020 141  10/03/2011 146 (H)   Assessment: Patient is a 65 y/o M with medical history including polysubstance abuse, diabetes, HTN , CAD s/p CABG, Afib who is admitted with acute bilateral PE. He is s/p thrombectomy. Patient with acute metabolic encephalopathy requiring intubation 7/14. Patient extubated 7/19 to BiPAP. Pharmacy consulted to assist with electrolyte monitoring and replacement as indicated.  Goal of Therapy:  Electrolytes WNL  Plan:  --No electrolyte replacement warranted at this time --Follow-up electrolytes as needed or ordered by team  Pricilla Riffle, PharmD, BCPS 12/31/2020 1:16 PM

## 2021-01-01 ENCOUNTER — Other Ambulatory Visit: Payer: Self-pay

## 2021-01-01 DIAGNOSIS — I2699 Other pulmonary embolism without acute cor pulmonale: Secondary | ICD-10-CM | POA: Diagnosis not present

## 2021-01-01 LAB — GLUCOSE, CAPILLARY
Glucose-Capillary: 104 mg/dL — ABNORMAL HIGH (ref 70–99)
Glucose-Capillary: 130 mg/dL — ABNORMAL HIGH (ref 70–99)
Glucose-Capillary: 174 mg/dL — ABNORMAL HIGH (ref 70–99)
Glucose-Capillary: 136 mg/dL — ABNORMAL HIGH (ref 70–99)
Glucose-Capillary: 117 mg/dL — ABNORMAL HIGH (ref 70–99)
Glucose-Capillary: 197 mg/dL — ABNORMAL HIGH (ref 70–99)

## 2021-01-01 LAB — BASIC METABOLIC PANEL
Anion gap: 11 (ref 5–15)
BUN: 24 mg/dL — ABNORMAL HIGH (ref 8–23)
CO2: 24 mmol/L (ref 22–32)
Calcium: 9 mg/dL (ref 8.9–10.3)
Chloride: 108 mmol/L (ref 98–111)
Creatinine, Ser: 1.07 mg/dL (ref 0.61–1.24)
GFR, Estimated: >60 mL/min (ref >60)
Glucose, Bld: 129 mg/dL — ABNORMAL HIGH (ref 70–99)
Potassium: 3.7 mmol/L (ref 3.5–5.1)
Sodium: 143 mmol/L (ref 135–145)

## 2021-01-01 LAB — MAGNESIUM: Magnesium: 2.2 mg/dL (ref 1.7–2.4)

## 2021-01-01 LAB — PHOSPHORUS: Phosphorus: 3.7 mg/dL (ref 2.5–4.6)

## 2021-01-01 MED ORDER — LEVALBUTEROL HCL 0.63 MG/3ML IN NEBU: 0.6300 mg | INHALATION_SOLUTION | Freq: Four times a day (QID) | RESPIRATORY_TRACT | Status: DC | PRN

## 2021-01-01 MED ORDER — AMIODARONE IV BOLUS ONLY 150 MG/100ML
INTRAVENOUS | Status: AC
  Filled 2021-01-01: qty 100

## 2021-01-01 MED ORDER — FUROSEMIDE 40 MG PO TABS
40.0000 mg | ORAL_TABLET | Freq: Two times a day (BID) | ORAL | Status: DC
  Administered 2021-01-01 – 2021-01-03 (×5): 40 mg via ORAL
  Filled 2021-01-01 (×3): qty 1
  Filled 2021-01-01 (×2): qty 2

## 2021-01-01 MED ORDER — DILTIAZEM HCL 25 MG/5ML IV SOLN
INTRAVENOUS | Status: AC
  Administered 2021-01-01: 5 mg via INTRAVENOUS
  Filled 2021-01-01: qty 5

## 2021-01-01 MED ORDER — DILTIAZEM HCL 25 MG/5ML IV SOLN: 5.0000 mg | Freq: Once | INTRAVENOUS | Status: AC

## 2021-01-01 MED ORDER — INSULIN ASPART 100 UNIT/ML IJ SOLN
0.0000 [IU] | Freq: Every day | INTRAMUSCULAR | Status: DC
Start: 2021-01-01 — End: 2021-01-03

## 2021-01-01 MED ORDER — HALOPERIDOL LACTATE 5 MG/ML IJ SOLN
INTRAMUSCULAR | Status: AC
  Administered 2021-01-01: 5 mg via INTRAVENOUS
  Filled 2021-01-01: qty 1

## 2021-01-01 MED ORDER — HALOPERIDOL LACTATE 5 MG/ML IJ SOLN: 5.0000 mg | Freq: Once | INTRAMUSCULAR | Status: AC

## 2021-01-01 MED ORDER — FOLIC ACID 1 MG PO TABS
1.0000 mg | ORAL_TABLET | Freq: Every day | ORAL | Status: DC
  Administered 2021-01-02 – 2021-01-03 (×2): 1 mg via ORAL
  Filled 2021-01-01 (×2): qty 1

## 2021-01-01 MED ORDER — INSULIN GLARGINE 100 UNIT/ML ~~LOC~~ SOLN
5.0000 [IU] | Freq: Every day | SUBCUTANEOUS | Status: DC
  Administered 2021-01-01 – 2021-01-02 (×2): 5 [IU] via SUBCUTANEOUS
  Filled 2021-01-01 (×2): qty 0.05

## 2021-01-01 MED ORDER — INSULIN ASPART 100 UNIT/ML IJ SOLN
0.0000 [IU] | Freq: Three times a day (TID) | INTRAMUSCULAR | Status: DC
  Administered 2021-01-02 – 2021-01-03 (×4): 4 [IU] via SUBCUTANEOUS
  Administered 2021-01-03: 7 [IU] via SUBCUTANEOUS
  Filled 2021-01-01 (×5): qty 1

## 2021-01-01 MED ORDER — NEPRO/CARBSTEADY PO LIQD
237.0000 mL | Freq: Three times a day (TID) | ORAL | Status: DC
  Administered 2021-01-01 – 2021-01-02 (×3): 237 mL via ORAL

## 2021-01-01 NOTE — TOC Progression Note (Signed)
Transition of Care [facility]) - Progression Note    Patient Details  Name: Jeremy [name] MRN: [id number] Date of Birth: 1955-09-09  Transition of Care [facility]) CM/SW Contact  Marina Goodell Phone Number: [phone] 01/01/2021, 2:44 PM  Clinical Narrative:     CSW spoke with patient and his significant other Jeremy [name],Jeremy [name] (Significant other)  [phone] (Work Phone).  CSW explained the role of TOC in patient care and the home health and SNF placement process.  Patient stated he wanted to return home. Patient is awake and alert.  Patient was tangential when talking but was easily redirected and was able to answer questions.  Patient stated he did not want his brother making any decisions on his behalf.  Patietn stated he wanted his nephew Jeremy [name] [phone] and his significant other Jeremy [name] [phone], and [phone], to make decisions on his behalf when necessary. CSW asked patient about attending outpatient substance use rehab and patient stated "I'm not going to do that stuff again." Patient stated he did not need outpatient rehab.  CSW asked about his PCP.  Patient stated he does not have a PCP but he will make an appointment.    Expected Discharge Plan: Home w Home Health Services Barriers to Discharge: Continued Medical Work up  Expected Discharge Plan and Services Expected Discharge Plan: Home w Home Health Services In-house Referral: Clinical Social Work   Post Acute Care Choice: Home Health Living arrangements for the past 2 months: Single Family Home                                       Social Determinants of Health (SDOH) Interventions    Readmission Risk Interventions No flowsheet data found.

## 2021-01-01 NOTE — Progress Notes (Signed)
PT Cancellation Note  Patient Details Name: Jeremy [name] MRN: [id number] DOB: 1955/06/29   Cancelled Treatment:    Reason Eval/Treat Not Completed: Medical issues which prohibited therapy (Chart reviewed. Deferring evaluation at this time. Per tele monitor, pt still in sinus tachycardia at rest in bed, rates just below 120bpm. Will continue to follow remotely and evaluate once medically ready.)  2:20 PM, 01/01/21 Rosamaria Lints, PT, DPT Physical Therapist - [facility]  [phone] (ASCOM)     Kavin Weckwerth C 01/01/2021, 2:19 PM

## 2021-01-01 NOTE — Progress Notes (Signed)
PROGRESS NOTE    Jeremy [name]  AGT:[id number] DOB: [birth year] DOA: 12/22/2020 PCP: Patient, No Pcp Per (Inactive)  Outpatient Specialists: [facility]    Brief Narrative:   Hx CAD s/p cabg, htn, dm, polysubstance abuse (cocaine, MJ), presenting 7/11 w/ sob and palpitations, found to have PE, for worsening agigation evaluated by vascular who proceeded w/ thrombectomy on 7/13, admitted to ICU for worsening agigation and inability to protect airway, intubated on 7/14 and started on precedex, estubated 7/19.   Assessment & Plan:   Active Problems:   AKI (acute kidney injury) ([place])   Essential hypertension   History of cocaine use   History of marijuana use   Bilateral pulmonary embolism (HCC)   NSTEMI (non-ST elevated myocardial infarction) ([place])   Acute respiratory failure with hypoxia (HCC)   On mechanically assisted ventilation (HCC)   Atrial fibrillation with RVR ([place])   Acute delirium   Tobacco use   Diabetes mellitus, type 2 ([place])  # Pulmonary embolism S/p thrombectomy. Symptomatically improved - cont xarelto  # Acute hypoxic respiratory failure 2/2 PE. S/p intubation, now weaned to 3 L - wean O2 as able  # Delirium 2/2 acute illness, possible drug withdrawal. MRI brain nothing acute - Redirection - on seroquel qhs, will stop that when able - will discontinue foley  # Klebsiella pneumonia Klebsiella growing on trachial aspirate. Abx started 7/18: zosyn> - cont ancef, plan for 7 day course  # HFrEF TTE with EF of 20%. Cardiology following. Appears somewhat fluid overloaded - cont metoprolol - start lasix 40 qd  # Acute kidney injury Resolved - monitor  # T2DM - start lantus 5 - SSI  # Paroxysmal a fib New dx. Currently appears sinus tach - cont metop and xarelto as above, cardiology following  # Cocaine abuse complicates care and prognosis   DVT prophylaxis: xarelto Code Status: full Family Communication: none @ bedside  Level of  care: Stepdown, will order transfer to progressive Status is: Inpatient  Remains inpatient appropriate because:Inpatient level of care appropriate due to severity of illness  Dispo: The patient is from: Home              Anticipated d/c is to:  tbd, will order PT eval              Patient currently is not medically stable to d/c.   Difficult to place patient No        Consultants:  Cardiology, vascular surgery  Procedures: thrombectomy  Antimicrobials:  Zosyn>ancef    Subjective: This morning says breathing improved, still confused  Objective: Vitals:   01/01/21 0600 01/01/21 0700 01/01/21 0730 01/01/21 0749  BP: (!) 149/84 (!) 134/102    Pulse: (!) 119 (!) 118 (!) 120 (!) 123  Resp: (!) 24 19 (!) 29 (!) 25  Temp:   98 F (36.7 C)   TempSrc:   Oral   SpO2: 95% 99% 98% 95%  Weight:      Height:        Intake/Output Summary (Last 24 hours) at 01/01/2021 0900 Last data filed at 01/01/2021 0736 Gross per 24 hour  Intake 1926.46 ml  Output 1315 ml  Net 611.46 ml   Filed Weights   12/28/20 0500 12/29/20 0425 12/30/20 0500  Weight: 113 kg 116.6 kg 104.9 kg    Examination:  General exam: A little agitated Respiratory system: Clear to auscultation. Respiratory effort normal. Cardiovascular system: S1 & S2 heard, tachycardic. No JVD, soft systolic murmur, no rubs,  gallops or clicks. 1+ pedal edema Gastrointestinal system: Abdomen is obese, soft and nontender. No organomegaly or masses felt. Normal bowel sounds heard. Central nervous system: Alert , oriented to self Extremities: Symmetric 5 x 5 power. Skin: No rashes, lesions or ulcers Psychiatry: delirious    Data Reviewed: I have personally reviewed following labs and imaging studies  CBC: Recent Labs  Lab 12/26/20 0832 12/27/20 0502 12/28/20 0420 12/29/20 0430 12/30/20 0500  WBC 10.8* 7.9 6.9 7.6 8.0  HGB 13.1 11.5* 11.3* 10.5* 11.1*  HCT 39.7 35.4* 35.4* 32.9* 34.2*  MCV 96.6 97.8 99.7 99.7 98.8   PLT 210 182 182 165 676   Basic Metabolic Panel: Recent Labs  Lab 12/27/20 0502 12/28/20 0420 12/28/20 1518 12/29/20 0430 12/30/20 0500 12/31/20 1154 01/01/21 0506  NA 141 140 138 139 142 141 143  K 4.4 4.9 4.7 4.8 3.9 5.0 3.7  CL 110 108 107 110 106 107 108  CO2 '26 26 26 26 29 22 24  ' GLUCOSE 162* 207* 301* 209* 197* 155* 129*  BUN 24* 24* 29* 35* 36* 31* 24*  CREATININE 1.52* 1.36* 1.42* 1.36* 1.29* 0.89 1.07  CALCIUM 8.4* 8.8* 8.7* 8.9 9.1 9.2 9.0  MG 2.2  --   --  2.1 1.9 2.0 2.2  PHOS 4.5 3.6  --  3.5 3.6 3.4 3.7   GFR: Estimated Creatinine Clearance: 87.1 mL/min (by C-G formula based on SCr of 1.07 mg/dL). Liver Function Tests: No results for input(s): AST, ALT, ALKPHOS, BILITOT, PROT, ALBUMIN in the last 168 hours. No results for input(s): LIPASE, AMYLASE in the last 168 hours. No results for input(s): AMMONIA in the last 168 hours. Coagulation Profile: No results for input(s): INR, PROTIME in the last 168 hours. Cardiac Enzymes: Recent Labs  Lab 12/26/20 2103  CKMB 3.0   BNP (last 3 results) No results for input(s): PROBNP in the last 8760 hours. HbA1C: No results for input(s): HGBA1C in the last 72 hours. CBG: Recent Labs  Lab 12/31/20 1759 12/31/20 1954 12/31/20 2347 01/01/21 0442 01/01/21 0734  GLUCAP 117* 170* 131* 104* 130*   Lipid Profile: No results for input(s): CHOL, HDL, LDLCALC, TRIG, CHOLHDL, LDLDIRECT in the last 72 hours. Thyroid Function Tests: No results for input(s): TSH, T4TOTAL, FREET4, T3FREE, THYROIDAB in the last 72 hours. Anemia Panel: No results for input(s): VITAMINB12, FOLATE, FERRITIN, TIBC, IRON, RETICCTPCT in the last 72 hours. Urine analysis:    Component Value Date/Time   COLORURINE YELLOW (A) 12/26/2020 2218   APPEARANCEUR CLOUDY (A) 12/26/2020 2218   LABSPEC 1.017 12/26/2020 2218   PHURINE 5.0 12/26/2020 2218   GLUCOSEU NEGATIVE 12/26/2020 2218   HGBUR NEGATIVE 12/26/2020 2218   BILIRUBINUR NEGATIVE 12/26/2020  [place] 12/26/2020 2218   PROTEINUR NEGATIVE 12/26/2020 2218   NITRITE NEGATIVE 12/26/2020 2218   LEUKOCYTESUR TRACE (A) 12/26/2020 2218   Sepsis Labs: '@LABRCNTIP' (procalcitonin:4,lacticidven:4)  ) Recent Results (from the past 240 hour(s))  Resp Panel by RT-PCR (Flu A&B, Covid) Nasopharyngeal Swab     Status: None   Collection Time: 12/22/20  2:21 PM   Specimen: Nasopharyngeal Swab; Nasopharyngeal(NP) swabs in vial transport medium  Result Value Ref Range Status   SARS Coronavirus 2 by RT PCR NEGATIVE NEGATIVE Final    Comment: (NOTE) SARS-CoV-2 target nucleic acids are NOT DETECTED.  The SARS-CoV-2 RNA is generally detectable in upper respiratory specimens during the acute phase of infection. The lowest concentration of SARS-CoV-2 viral copies this assay can detect is 138 copies/mL. A  negative result does not preclude SARS-Cov-2 infection and should not be used as the sole basis for treatment or other patient management decisions. A negative result may occur with  improper specimen collection/handling, submission of specimen other than nasopharyngeal swab, presence of viral mutation(s) within the areas targeted by this assay, and inadequate number of viral copies(<138 copies/mL). A negative result must be combined with clinical observations, patient history, and epidemiological information. The expected result is Negative.  Fact Sheet for Patients:  EntrepreneurPulse.com.au  Fact Sheet for Healthcare Providers:  IncredibleEmployment.be  This test is no t yet approved or cleared by the [place] FDA and  has been authorized for detection and/or diagnosis of SARS-CoV-2 by FDA under an Emergency Use Authorization (EUA). This EUA will remain  in effect (meaning this test can be used) for the duration of the COVID-19 declaration under Section 564(b)(1) of the Act, 21 U.S.C.section 360bbb-3(b)(1), unless the authorization  is terminated  or revoked sooner.       Influenza A by PCR NEGATIVE NEGATIVE Final   Influenza B by PCR NEGATIVE NEGATIVE Final    Comment: (NOTE) The Xpert Xpress SARS-CoV-2/FLU/RSV plus assay is intended as an aid in the diagnosis of influenza from Nasopharyngeal swab specimens and should not be used as a sole basis for treatment. Nasal washings and aspirates are unacceptable for Xpert Xpress SARS-CoV-2/FLU/RSV testing.  Fact Sheet for Patients: EntrepreneurPulse.com.au  Fact Sheet for Healthcare Providers: IncredibleEmployment.be  This test is not yet approved or cleared by the [place] FDA and has been authorized for detection and/or diagnosis of SARS-CoV-2 by FDA under an Emergency Use Authorization (EUA). This EUA will remain in effect (meaning this test can be used) for the duration of the COVID-19 declaration under Section 564(b)(1) of the Act, 21 U.S.C. section 360bbb-3(b)(1), unless the authorization is terminated or revoked.  Performed at [facility], [address] [place], [place] [address]   MRSA Next Gen by PCR, Nasal     Status: None   Collection Time: 12/24/20 11:02 PM   Specimen: Nasal Mucosa; Nasal Swab  Result Value Ref Range Status   MRSA by PCR Next Gen NOT DETECTED NOT DETECTED Final    Comment: (NOTE) The GeneXpert MRSA Assay (FDA approved for NASAL specimens only), is one component of a comprehensive MRSA colonization surveillance program. It is not intended to diagnose MRSA infection nor to guide or monitor treatment for MRSA infections. Test performance is not FDA approved in patients less than 64 years old. Performed at [facility], [address] [place], [place] [address]   Culture, Respiratory w Gram Stain     Status: None   Collection Time: 12/26/20 11:45 AM   Specimen: Tracheal Aspirate; Respiratory  Result Value Ref Range Status   Specimen Description   Final    TRACHEAL  ASPIRATE Performed at [facility], [address] [place], [place] [address]    Special Requests   Final    NONE Performed at [facility], [place], [place] [address]    Gram Stain   Final    ABUNDANT WBC PRESENT,BOTH PMN AND MONONUCLEAR RARE GRAM POSITIVE COCCI    Culture   Final    RARE Normal respiratory flora-no Staph aureus or Pseudomonas seen Performed at [facility], [place] [address] [place], [place] [address]    Report Status 12/28/2020 FINAL  Final  Culture, Respiratory w Gram Stain     Status: None   Collection Time:  12/28/20  3:56 PM   Specimen: SPU; Respiratory  Result Value Ref Range Status   Specimen Description   Final    SPUTUM Performed at [facility], [address] [place], [place] [address]    Special Requests   Final    NONE Performed at [facility], [address] [place], [place] [address]    Gram Stain   Final    RARE WBC PRESENT, PREDOMINANTLY PMN RARE GRAM POSITIVE COCCI IN PAIRS IN CLUSTERS RARE GRAM NEGATIVE RODS    Culture   Final    MODERATE KLEBSIELLA PNEUMONIAE NO STAPHYLOCOCCUS AUREUS ISOLATED No Pseudomonas species isolated Performed at [facility], [address], [place], [place] [address]    Report Status 12/31/2020 FINAL  Final   Organism ID, Bacteria KLEBSIELLA PNEUMONIAE  Final      Susceptibility   Klebsiella pneumoniae - MIC*    AMPICILLIN >=32 RESISTANT Resistant     CEFAZOLIN <=4 SENSITIVE Sensitive     CEFEPIME <=0.12 SENSITIVE Sensitive     CEFTAZIDIME <=1 SENSITIVE Sensitive     CEFTRIAXONE <=0.25 SENSITIVE Sensitive     CIPROFLOXACIN <=0.25 SENSITIVE Sensitive     GENTAMICIN <=1 SENSITIVE Sensitive     IMIPENEM <=0.25 SENSITIVE Sensitive     TRIMETH/SULFA <=20 SENSITIVE Sensitive     AMPICILLIN/SULBACTAM 4 SENSITIVE Sensitive     PIP/TAZO <=4 SENSITIVE Sensitive     * MODERATE KLEBSIELLA PNEUMONIAE         Radiology Studies: No  results found.      Scheduled Meds:  vitamin C  250 mg Oral BID   chlorhexidine gluconate (MEDLINE KIT)  15 mL Mouth Rinse BID   Chlorhexidine Gluconate Cloth  6 each Topical Daily   feeding supplement  237 mL Oral TID BM   folic acid  1 mg Intravenous Daily   insulin aspart  0-20 Units Subcutaneous Q4H   levalbuterol  0.63 mg Nebulization TID   metoprolol tartrate  25 mg Oral Q6H   multivitamin with minerals  1 tablet Per Tube Daily   nicotine  21 mg Transdermal Daily   pantoprazole  40 mg Oral Daily   QUEtiapine  25 mg Oral QHS   rivaroxaban  15 mg Oral Daily   Followed by   Derrill Memo ON 01/21/2021] rivaroxaban  20 mg Oral Q supper   sodium chloride flush  10-40 mL Intracatheter Q12H   thiamine  100 mg Oral Daily   Or   thiamine  100 mg Intravenous Daily   Continuous Infusions:   ceFAZolin (ANCEF) IV 1 g (01/01/21 0558)   dexmedetomidine (PRECEDEX) IV infusion Stopped (12/31/20 1127)     LOS: 10 days    Time spent: 72 min    Desma Maxim, MD [facility]   If 7PM-7AM, please contact night-coverage [email or web address] Password [email or web address] 01/01/2021, 9:00 AM

## 2021-01-02 ENCOUNTER — Inpatient Hospital Stay: Payer: Medicaid Other

## 2021-01-02 DIAGNOSIS — I2699 Other pulmonary embolism without acute cor pulmonale: Secondary | ICD-10-CM | POA: Diagnosis not present

## 2021-01-02 LAB — CBC
HCT: 40.9 % (ref 39.0–52.0)
Hemoglobin: 13.5 g/dL (ref 13.0–17.0)
MCH: 31.8 pg (ref 26.0–34.0)
MCHC: 33 g/dL (ref 30.0–36.0)
MCV: 96.2 fL (ref 80.0–100.0)
Platelets: 257 10*3/uL (ref 150–400)
RBC: 4.25 MIL/uL (ref 4.22–5.81)
RDW: 13.5 % (ref 11.5–15.5)
WBC: 8.2 10*3/uL (ref 4.0–10.5)
nRBC: 0.2 % (ref 0.0–0.2)

## 2021-01-02 LAB — BASIC METABOLIC PANEL
Anion gap: 10 (ref 5–15)
BUN: 17 mg/dL (ref 8–23)
CO2: 27 mmol/L (ref 22–32)
Calcium: 9.3 mg/dL (ref 8.9–10.3)
Chloride: 106 mmol/L (ref 98–111)
Creatinine, Ser: 1.18 mg/dL (ref 0.61–1.24)
GFR, Estimated: >60 mL/min (ref >60)
Glucose, Bld: 170 mg/dL — ABNORMAL HIGH (ref 70–99)
Potassium: 3.1 mmol/L — ABNORMAL LOW (ref 3.5–5.1)
Sodium: 143 mmol/L (ref 135–145)

## 2021-01-02 LAB — MAGNESIUM: Magnesium: 2 mg/dL (ref 1.7–2.4)

## 2021-01-02 LAB — PHOSPHORUS: Phosphorus: 3.4 mg/dL (ref 2.5–4.6)

## 2021-01-02 LAB — GLUCOSE, CAPILLARY
Glucose-Capillary: 188 mg/dL — ABNORMAL HIGH (ref 70–99)
Glucose-Capillary: 164 mg/dL — ABNORMAL HIGH (ref 70–99)
Glucose-Capillary: 144 mg/dL — ABNORMAL HIGH (ref 70–99)

## 2021-01-02 MED ORDER — METOPROLOL TARTRATE 50 MG PO TABS
50.0000 mg | ORAL_TABLET | Freq: Four times a day (QID) | ORAL | Status: DC
  Administered 2021-01-02 – 2021-01-03 (×3): 50 mg via ORAL
  Filled 2021-01-02 (×3): qty 1

## 2021-01-02 MED ORDER — RIVAROXABAN 20 MG PO TABS: 20.0000 mg | ORAL_TABLET | Freq: Every day | ORAL | Status: DC

## 2021-01-02 MED ORDER — RIVAROXABAN 15 MG PO TABS
15.0000 mg | ORAL_TABLET | Freq: Once | ORAL | Status: DC
  Filled 2021-01-02: qty 1

## 2021-01-02 MED ORDER — INSULIN GLARGINE 100 UNIT/ML ~~LOC~~ SOLN
10.0000 [IU] | Freq: Every day | SUBCUTANEOUS | Status: DC
  Filled 2021-01-02: qty 0.1

## 2021-01-02 MED ORDER — RIVAROXABAN 15 MG PO TABS
15.0000 mg | ORAL_TABLET | Freq: Two times a day (BID) | ORAL | Status: DC
  Administered 2021-01-02 – 2021-01-03 (×2): 15 mg via ORAL
  Filled 2021-01-02 (×4): qty 1

## 2021-01-02 MED ORDER — HALOPERIDOL LACTATE 5 MG/ML IJ SOLN
1.0000 mg | Freq: Four times a day (QID) | INTRAMUSCULAR | Status: AC | PRN
Start: 2021-01-02 — End: 2021-01-02
  Administered 2021-01-02: 1 mg via INTRAVENOUS
  Filled 2021-01-02: qty 1

## 2021-01-02 MED ORDER — POTASSIUM CHLORIDE CRYS ER 20 MEQ PO TBCR
40.0000 meq | EXTENDED_RELEASE_TABLET | ORAL | Status: AC
  Administered 2021-01-02: 40 meq via ORAL
  Filled 2021-01-02: qty 2

## 2021-01-02 NOTE — Progress Notes (Signed)
Inpatient Rehab Admissions Coordinator Note:   Per therapy recommendations, pt was screened for CIR candidacy by Estill Dooms, PT, DPT.  At this time we are recommending an inpatient rehab consult and I will place an order so an [facility] can follow up.  Please contact me with questions.   Estill Dooms, PT, DPT [phone] 01/02/21 4:22 PM

## 2021-01-02 NOTE — Progress Notes (Signed)
PROGRESS NOTE    Jeremy [name]  IEP:[id number] DOB: 1956-03-17 DOA: 12/22/2020 PCP: Patient, No Pcp Per (Inactive)  Outpatient Specialists: [facility]    Brief Narrative:   Hx CAD s/p cabg, htn, dm, polysubstance abuse (cocaine, MJ), presenting 7/11 w/ sob and palpitations, found to have PE, for worsening agigation evaluated by vascular who proceeded w/ thrombectomy on 7/13, admitted to ICU for worsening agigation and inability to protect airway, intubated on 7/14 and started on precedex, estubated 7/19.   Assessment & Plan:   Active Problems:   AKI (acute kidney injury) ([place])   Essential hypertension   History of cocaine use   History of marijuana use   Bilateral pulmonary embolism (HCC)   NSTEMI (non-ST elevated myocardial infarction) ([place])   Acute respiratory failure with hypoxia (HCC)   On mechanically assisted ventilation (HCC)   Atrial fibrillation with RVR ([place])   Acute delirium   Tobacco use   Diabetes mellitus, type 2 ([place])  # Pulmonary embolism S/p thrombectomy. Symptomatically improved. Weaned off o2 - cont xarelto  # Acute hypoxic respiratory failure 2/2 PE. 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 MRI brain nothing acute, repeat today for gait dysfunction shows no new infarct but various remote infarcts. Delirium improving - Redirection - will stop qhs seroquel  # Klebsiella pneumonia Klebsiella growing on trachial aspirate. Abx started 7/18: zosyn> - cont ancef, plan for 7 day course  # HFrEF TTE with EF of 20%. Cardiology following. Appears somewhat fluid overloaded - cont metoprolol - started lasix 40 qd  # Hypokalemia K 3.1 in setting of diuresis. Mg wnl - k 40 x2  # Acute kidney injury Resolved - monitor  # T2DM - incr lantus to 10 - SSI  # Paroxysmal a fib New dx. Currently a fib hr in low 100s - cont xarelto. Increase metop to 50 q6, will consolidate tomorrow  # Cocaine  abuse complicates care and prognosis. TOC has seen   DVT prophylaxis: xarelto Code Status: full Family Communication: wife updated @ bedside 7/22  Level of care: Progressive Cardiac Status is: Inpatient  Remains inpatient appropriate because:Inpatient level of care appropriate due to severity of illness  Dispo: The patient is from: Home              Anticipated d/c is to: tbd, pt eval pending              Patient currently is not medically stable to d/c.   Difficult to place patient No        Consultants:  Cardiology, vascular surgery  Procedures: thrombectomy  Antimicrobials:  Zosyn>ancef    Subjective: This morning says breathing improved, still a bit confused but less than yesterday, tolerating diet  Objective: Vitals:   01/02/21 0500 01/02/21 0537 01/02/21 0724 01/02/21 1153  BP:  (!) 146/87 111/70 125/75  Pulse:  (!) 121 100 (!) 102  Resp:  '14 17 17  ' Temp:  98.4 F (36.9 C) 98.1 F (36.7 C) 98.1 F (36.7 C)  TempSrc:    Oral  SpO2:  97% 91% 100%  Weight: 104 kg     Height:        Intake/Output Summary (Last 24 hours) at 01/02/2021 1359 Last data filed at 01/02/2021 1337 Gross per 24 hour  Intake 1740 ml  Output 1375 ml  Net 365 ml   Filed Weights   12/29/20 0425 12/30/20 0500 01/02/21 0500  Weight: 116.6 kg 104.9 kg 104 kg  Examination:  General exam: NAD Respiratory system: Clear to auscultation. Respiratory effort normal. Cardiovascular system: S1 & S2 heard, tachycardic. No JVD, soft systolic murmur, no rubs, gallops or clicks. 1+ pedal edema Gastrointestinal system: Abdomen is obese, soft and nontender. No organomegaly or masses felt. Normal bowel sounds heard. Central nervous system: Alert , oriented to self Extremities: Symmetric 5 x 5 power. Skin: No rashes, lesions or ulcers Psychiatry: calm    Data Reviewed: I have personally reviewed following labs and imaging studies  CBC: Recent Labs  Lab 12/27/20 0502 12/28/20 0420  12/29/20 0430 12/30/20 0500 01/02/21 0617  WBC 7.9 6.9 7.6 8.0 8.2  HGB 11.5* 11.3* 10.5* 11.1* 13.5  HCT 35.4* 35.4* 32.9* 34.2* 40.9  MCV 97.8 99.7 99.7 98.8 96.2  PLT 182 182 165 208 287   Basic Metabolic Panel: Recent Labs  Lab 12/29/20 0430 12/30/20 0500 12/31/20 1154 01/01/21 0506 01/02/21 0617  NA 139 142 141 143 143  K 4.8 3.9 5.0 3.7 3.1*  CL 110 106 107 108 106  CO2 '26 29 22 24 27  ' GLUCOSE 209* 197* 155* 129* 170*  BUN 35* 36* 31* 24* 17  CREATININE 1.36* 1.29* 0.89 1.07 1.18  CALCIUM 8.9 9.1 9.2 9.0 9.3  MG 2.1 1.9 2.0 2.2 2.0  PHOS 3.5 3.6 3.4 3.7 3.4   GFR: Estimated Creatinine Clearance: 78.7 mL/min (by C-G formula based on SCr of 1.18 mg/dL). Liver Function Tests: No results for input(s): AST, ALT, ALKPHOS, BILITOT, PROT, ALBUMIN in the last 168 hours. No results for input(s): LIPASE, AMYLASE in the last 168 hours. No results for input(s): AMMONIA in the last 168 hours. Coagulation Profile: No results for input(s): INR, PROTIME in the last 168 hours. Cardiac Enzymes: Recent Labs  Lab 12/26/20 2103  CKMB 3.0   BNP (last 3 results) No results for input(s): PROBNP in the last 8760 hours. HbA1C: No results for input(s): HGBA1C in the last 72 hours. CBG: Recent Labs  Lab 01/01/21 1150 01/01/21 1515 01/01/21 2100 01/01/21 2305 01/02/21 0726  GLUCAP 174* 136* 117* 197* 188*   Lipid Profile: No results for input(s): CHOL, HDL, LDLCALC, TRIG, CHOLHDL, LDLDIRECT in the last 72 hours. Thyroid Function Tests: No results for input(s): TSH, T4TOTAL, FREET4, T3FREE, THYROIDAB in the last 72 hours. Anemia Panel: No results for input(s): VITAMINB12, FOLATE, FERRITIN, TIBC, IRON, RETICCTPCT in the last 72 hours. Urine analysis:    Component Value Date/Time   COLORURINE YELLOW (A) 12/26/2020 2218   APPEARANCEUR CLOUDY (A) 12/26/2020 2218   LABSPEC 1.017 12/26/2020 2218   PHURINE 5.0 12/26/2020 2218   GLUCOSEU NEGATIVE 12/26/2020 2218   HGBUR NEGATIVE  12/26/2020 2218   BILIRUBINUR NEGATIVE 12/26/2020 [place] 12/26/2020 2218   PROTEINUR NEGATIVE 12/26/2020 2218   NITRITE NEGATIVE 12/26/2020 2218   LEUKOCYTESUR TRACE (A) 12/26/2020 2218   Sepsis Labs: '@LABRCNTIP' (procalcitonin:4,lacticidven:4)  ) Recent Results (from the past 240 hour(s))  MRSA Next Gen by PCR, Nasal     Status: None   Collection Time: 12/24/20 11:02 PM   Specimen: Nasal Mucosa; Nasal Swab  Result Value Ref Range Status   MRSA by PCR Next Gen NOT DETECTED NOT DETECTED Final    Comment: (NOTE) The GeneXpert MRSA Assay (FDA approved for NASAL specimens only), is one component of a comprehensive MRSA colonization surveillance program. It is not intended to diagnose MRSA infection nor to guide or monitor treatment for MRSA infections. Test performance is not FDA approved in patients less than 2 years  old. Performed at [facility], [address], [place], [place] [address]   Culture, Respiratory w Gram Stain     Status: None   Collection Time: 12/26/20 11:45 AM   Specimen: Tracheal Aspirate; Respiratory  Result Value Ref Range Status   Specimen Description   Final    TRACHEAL ASPIRATE Performed at [facility], [address], [place], [place] [address]    Special Requests   Final    NONE Performed at [facility], [place], [place] [address]    Gram Stain   Final    ABUNDANT WBC PRESENT,BOTH PMN AND MONONUCLEAR RARE GRAM POSITIVE COCCI    Culture   Final    RARE Normal respiratory flora-no Staph aureus or Pseudomonas seen Performed at [facility], [place] [address] [place], [place] [address]    Report Status 12/28/2020 FINAL  Final  Culture, Respiratory w Gram Stain     Status: None   Collection Time: 12/28/20  3:56 PM   Specimen: SPU; Respiratory  Result Value Ref Range Status   Specimen Description   Final    SPUTUM Performed at [facility], [address]  [place], [place] [address]    Special Requests   Final    NONE Performed at [facility], [address] [place], [place] [address]    Gram Stain   Final    RARE WBC PRESENT, PREDOMINANTLY PMN RARE GRAM POSITIVE COCCI IN PAIRS IN CLUSTERS RARE GRAM NEGATIVE RODS    Culture   Final    MODERATE KLEBSIELLA PNEUMONIAE NO STAPHYLOCOCCUS AUREUS ISOLATED No Pseudomonas species isolated Performed at [facility] [address], [place],  [address]    Report Status 12/31/2020 FINAL  Final   Organism ID, Bacteria KLEBSIELLA PNEUMONIAE  Final      Susceptibility   Klebsiella pneumoniae - MIC*    AMPICILLIN >=32 RESISTANT Resistant     CEFAZOLIN <=4 SENSITIVE Sensitive     CEFEPIME <=0.12 SENSITIVE Sensitive     CEFTAZIDIME <=1 SENSITIVE Sensitive     CEFTRIAXONE <=0.25 SENSITIVE Sensitive     CIPROFLOXACIN <=0.25 SENSITIVE Sensitive     GENTAMICIN <=1 SENSITIVE Sensitive     IMIPENEM <=0.25 SENSITIVE Sensitive     TRIMETH/SULFA <=20 SENSITIVE Sensitive     AMPICILLIN/SULBACTAM 4 SENSITIVE Sensitive     PIP/TAZO <=4 SENSITIVE Sensitive     * MODERATE KLEBSIELLA PNEUMONIAE         Radiology Studies: MR BRAIN WO CONTRAST  Result Date: 01/02/2021 CLINICAL DATA:  Neuro deficit, acute, stroke suspected asymetric weakness. Additional provided: Confusion and delirium. EXAM: MRI HEAD WITHOUT CONTRAST TECHNIQUE: Multiplanar, multiecho pulse sequences of the brain and surrounding structures were obtained without intravenous contrast. COMPARISON:  Brain MRI 12/27/2020. FINDINGS: Brain: Mild intermittent motion degradation. Mild cerebral and cerebellar atrophy. Redemonstrated chronic lacunar infarcts within the bilateral cerebral hemispheric white matter, right caudate nucleus and anterior limb of right internal capsule. Background moderate/advanced patchy and confluent T2/FLAIR hyperintensity within the cerebral white matter, nonspecific but compatible with chronic small vessel  ischemic disease. Redemonstrated small chronic infarcts within the bilateral cerebellar hemispheres. A chronic microhemorrhage within the left frontal lobe white matter was better appreciated on the prior examination (at which time SWI imaging was performed). There is no acute infarct. No evidence of an intracranial mass. No extra-axial fluid collection. No midline shift. Vascular: Expected proximal arterial flow voids. Skull and upper cervical spine: No focal marrow lesion.  Sinuses/Orbits: Visualized orbits show no acute finding. Trace bilateral maxillary sinus mucosal thickening. Other: Small left mastoid effusion. IMPRESSION: Mildly motion degraded exam. No evidence of acute intracranial abnormality. Redemonstrated chronic lacunar infarcts within the bilateral cerebral hemispheric white matter, right basal ganglia and anterior limb of right internal capsule. Stable background moderate/advanced chronic small vessel ischemic changes within the cerebral white matter. Redemonstrated small chronic infarcts within the bilateral cerebellar hemispheres. Small left mastoid effusion. Electronically Signed   By: Kellie Simmering DO   On: 01/02/2021 11:32        Scheduled Meds:  vitamin C  250 mg Oral BID   chlorhexidine gluconate (MEDLINE KIT)  15 mL Mouth Rinse BID   Chlorhexidine Gluconate Cloth  6 each Topical Daily   feeding supplement (NEPRO CARB STEADY)  237 mL Oral TID BM   folic acid  1 mg Oral Daily   furosemide  40 mg Oral BID   insulin aspart  0-20 Units Subcutaneous TID WC   insulin aspart  0-5 Units Subcutaneous QHS   insulin glargine  5 Units Subcutaneous Daily   metoprolol tartrate  50 mg Oral Q6H   multivitamin with minerals  1 tablet Per Tube Daily   nicotine  21 mg Transdermal Daily   pantoprazole  40 mg Oral Daily   potassium chloride  40 mEq Oral Q4H   QUEtiapine  25 mg Oral QHS   rivaroxaban  15 mg Oral Daily   Followed by   Derrill Memo ON 01/21/2021] rivaroxaban  20 mg Oral Q supper    sodium chloride flush  10-40 mL Intracatheter Q12H   thiamine  100 mg Oral Daily   Or   thiamine  100 mg Intravenous Daily   Continuous Infusions:   ceFAZolin (ANCEF) IV 1 g (01/02/21 0620)     LOS: 11 days    Time spent: 30 min    Desma Maxim, MD [facility]   If 7PM-7AM, please contact night-coverage [email or web address] Password [facility] 01/02/2021, 1:59 PM

## 2021-01-02 NOTE — Evaluation (Addendum)
Physical Therapy Evaluation Patient Details Name: Jeremy [name] MRN: [id number] DOB: 1956/02/03 Today's Date: 01/02/2021   History of Present Illness  presented to ER secondary to palpitations, SOB; admitted for management of acute hypoxic respiratory failure related to bilat PE.  S/p thrombectomy 7/13.  Hospital course further complicated by agitation requiring transfer to CCU for precedex drip; ultimately intubated 7/14-7/19/22 for airway management.  Clinical Impression  Patient resting in bed upon arrival to room; alert and oriented to basic information.  Follows simple commands and agreeable to session; mildly impulsive, but easily redirectable.  Easily distracted by external environment; frequent cuing for redirection to task at hand. Intermittently labile (tearful at times) during evaluation; provided with support/encouragement as appropriate.  Denies pain throughout session.  Globally weak and deconditioned due to acute hospitalization, but does appear to demonstrate asymmetrical strength loss to R hemi-body.  Unable to complete full ROM against gravity in R UE and with noted coordination deficits and suspected inattention to R UE.  R LE with consistent genu recurvatum in loading phases of gait, impaired balance and gait safety as result.   Currently requiring min assist for bed mobility; min/mod assist for sit/stand, basic tranfsers and gait (25') with RW. Demonstrates 3-point, step to gait pattern; discoordinated R LE placement and overall control, noted R genu recurvatum in loading phase.  Mild difficulty with R UE grasp at times.  Limited balance reactions; unsafe to attempt without RW and +1 at all times MD notified of evaluation findings and concern re: asymmetrical strength, functional deficits and difficulty managing oral secretions at times.  Question benefit of MRI, SLP and OT consult; MD to order as appropriate. Would benefit from skilled PT to address above deficits and promote  optimal return to PLOF.;recommend transition to acute inpatient rehab upon discharge for high-intensity, post-acute rehab services.     Follow Up Recommendations CIR    Equipment Recommendations  Rolling walker with 5" wheels;3in1 (PT)    Recommendations for Other Services       Precautions / Restrictions Precautions Precautions: Fall Restrictions Weight Bearing Restrictions: No      Mobility  Bed Mobility Overal bed mobility: Needs Assistance Bed Mobility: Supine to Sit     Supine to sit: Min assist     General bed mobility comments: min assist/cuing to sequence transition towards L side of bed    Transfers Overall transfer level: Needs assistance Equipment used: Rolling walker (2 wheeled) Transfers: Sit to/from Stand Sit to Stand: Min assist;Mod assist         General transfer comment: min/mod progressing to min assist for lift off, standing balance and initial stabilization; cuing for hand placement, mild difficulty with R hand placement and sustained grasp  Ambulation/Gait Ambulation/Gait assistance: Min assist Gait Distance (Feet): 25 Feet Assistive device: Rolling walker (2 wheeled)       General Gait Details: 3-point, step to gait pattern; discoordinated R LE placement and overall control, noted R genu recurvatum in loading phase.  Mild difficulty with R UE grasp at times.  Limited balance reactions; unsafe to attempt without RW and +1 at all times  Stairs            Wheelchair Mobility    Modified Rankin (Stroke Patients Only)       Balance Overall balance assessment: Needs assistance Sitting-balance support: No upper extremity supported;Feet supported Sitting balance-Leahy Scale: Good     Standing balance support: Bilateral upper extremity supported Standing balance-Leahy Scale: Fair  Pertinent Vitals/Pain Pain Assessment: No/denies pain    Home Living Family/patient expects to be  discharged to:: Private residence Living Arrangements: Non-relatives/Friends   Type of Home: House Home Access: Stairs to enter Entrance Stairs-Rails: None Entrance Stairs-Number of Steps: 1 Home Layout: One level Home Equipment: None      Prior Function Level of Independence: Independent         Comments: Indep with ADLs, household and community mobilization without assist device; denies fall history; no home O2; does not drive.     Hand Dominance   Dominant Hand: Right    Extremity/Trunk Assessment   Upper Extremity Assessment Upper Extremity Assessment:  (R UE grossly 2+ to 3-/5 in R shoulder, 3 to 4-/5 in elbow, wrist and hand.  L UE grossly 4-/5 throughout.  Denies sensory deficit; appropriately localizes light touch, no extinction.  Suspected inattention to R UE)    Lower Extremity Assessment Lower Extremity Assessment:  (grossly 3+ to 4-/5 throughout R LE; 4+/5 throughout L LE.  Denies sensory deficit)       Communication   Communication: No difficulties  Cognition Arousal/Alertness: Awake/alert Behavior During Therapy: WFL for tasks assessed/performed Overall Cognitive Status: Within Functional Limits for tasks assessed                                 General Comments: slightly impulsive, but easily redirectable      General Comments      Exercises Other Exercises Other Exercises: Sit/stand from various surface heights (bed/recliner) with RW, min/mod assist for lift off, safety.  Slightly impulsive, but easily redirectable.  Easily distracted by external environment. Other Exercises: Reviewed role of PT and progressive mobility, safety with transfers/gait, use of RW and options for follow up therapies; patient/girlfriend voiced understanding of all information.   Assessment/Plan    PT Assessment Patient needs continued PT services  PT Problem List Decreased strength;Decreased range of motion;Decreased activity tolerance;Decreased  balance;Decreased mobility;Decreased cognition;Decreased knowledge of use of DME;Decreased coordination;Decreased safety awareness;Decreased knowledge of precautions;Cardiopulmonary status limiting activity       PT Treatment Interventions DME instruction;Gait training;Stair training;Functional mobility training;Therapeutic activities;Therapeutic exercise;Balance training;Neuromuscular re-education;Patient/family education;Cognitive remediation    PT Goals (Current goals can be found in the Care Plan section)  Acute Rehab PT Goals Patient Stated Goal: to get better PT Goal Formulation: With patient/family Time For Goal Achievement: 01/16/21 Potential to Achieve Goals: Good    Frequency 7X/week   Barriers to discharge        Co-evaluation               AM-PAC PT "6 Clicks" Mobility  Outcome Measure Help needed turning from your back to your side while in a flat bed without using bedrails?: None Help needed moving from lying on your back to sitting on the side of a flat bed without using bedrails?: A Little Help needed moving to and from a bed to a chair (including a wheelchair)?: A Little Help needed standing up from a chair using your arms (e.g., wheelchair or bedside chair)?: A Lot Help needed to walk in hospital room?: A Little Help needed climbing 3-5 steps with a railing? : A Lot 6 Click Score: 17    End of Session Equipment Utilized During Treatment: Gait belt Activity Tolerance: Patient tolerated treatment well Patient left: in chair;with call bell/phone within reach;with chair alarm set Nurse Communication: Mobility status PT Visit Diagnosis: Muscle weakness (generalized) (M62.81);Difficulty in  walking, not elsewhere classified (R26.2);Other symptoms and signs involving the nervous system (R29.898)    Time: 1287-8676 PT Time Calculation (min) (ACUTE ONLY): 35 min   Charges:   PT Evaluation $PT Eval Moderate Complexity: 1 Mod PT Treatments $Therapeutic  Activity: 23-37 mins        Nakshatra Klose H. Manson Passey, PT, DPT, NCS 01/02/21, 2:42 PM [phone]

## 2021-01-02 NOTE — Evaluation (Signed)
Occupational Therapy Evaluation Patient Details Name: Jeremy [name] MRN: [id number] DOB: July 22, 1955 Today's Date: 01/02/2021    History of Present Illness presented to ER secondary to palpitations, SOB; admitted for management of acute hypoxic respiratory failure related to bilat PE.  S/p thrombectomy 7/13.  Hospital course further complicated by agitation requiring transfer to CCU for precedex drip; ultimately intubated 7/14-7/19/22 for airway management.   Clinical Impression   Pt seen for OT evaluation this date. Prior to hospital admission, pt was independent in all aspects of ADL management. Pt endorses baking, caring for his dog, and doing yard work as primary meaningful occupations. Pt lives with his significant other in a one-level home with one STE. Currently pt demonstrates significant impairments in RUE/RLE strength & coordination. Pt is R hand dominant and requires MIN-MOD assist for assist for standing ADL and MOD assist for transfers due to decreased functional use of his RLE and RUE. Pt is eager to return to his PLOF with increased independence and functional use of his R arm and leg. Pt educated on safe positioning of RUE in order to promote functional return, improve safety, and promote skin integrity on this date. Pt was motivated t/o OT session and states he is eager to get back in the kitchen. Pt would benefit from skilled OT to address noted impairments and functional limitations (see below for any additional details) in order to maximize safety and independence while minimizing falls risk and caregiver burden.  Upon hospital discharge, recommend pt discharge to acute inpatient rehabilitation to maximize safety and return to PLOF.     Follow Up Recommendations  CIR    Equipment Recommendations  3 in 1 bedside commode    Recommendations for Other Services Rehab consult;Speech consult     Precautions / Restrictions Precautions Precautions: Fall Restrictions Weight  Bearing Restrictions: No      Mobility Bed Mobility Overal bed mobility: Needs Assistance Bed Mobility: Supine to Sit     Supine to sit: Min assist;HOB elevated     General bed mobility comments: Significant increased time/effort to exit out of R side of bed.    Transfers Overall transfer level: Needs assistance Equipment used: Rolling walker (2 wheeled) Transfers: Sit to/from Stand Sit to Stand: Min assist;Mod assist         General transfer comment: min/mod progressing to min assist for lift off, standing balance and initial stabilization; cuing for hand placement, mild difficulty with R hand placement and sustained grasp    Balance Overall balance assessment: Needs assistance Sitting-balance support: No upper extremity supported;Feet supported Sitting balance-Leahy Scale: Good Sitting balance - Comments: Steady static sitting, reaching within BOS. Intermittent L lateral lean appreciated during session. Postural control: Left lateral lean Standing balance support: Bilateral upper extremity supported Standing balance-Leahy Scale: Fair Standing balance comment: Occasional LOB with weight shift.                           ADL either performed or assessed with clinical judgement   ADL Overall ADL's : Needs assistance/impaired Eating/Feeding: Set up;Supervision/ safety Eating/Feeding Details (indicate cue type and reason): Some concern for R side facial droop/R side weakness. SLP consult requested. Grooming: Standing;Moderate assistance;Cueing for safety;Cueing for sequencing   Upper Body Bathing: Moderate assistance;Sitting;Cueing for compensatory techniques;Cueing for safety   Lower Body Bathing: Moderate assistance;Sitting/lateral leans;With adaptive equipment;Cueing for safety;Cueing for compensatory techniques   Upper Body Dressing : Sitting;Set up;Supervision/safety   Lower Body Dressing: Moderate assistance;Sit to/from stand;Cueing  for compensatory  techniques   Toilet Transfer: RW;BSC;Set up;Minimal assistance;Moderate assistance   Toileting- Clothing Manipulation and Hygiene: Minimal assistance;Moderate assistance;Sit to/from stand       Functional mobility during ADLs: Minimal assistance;Moderate assistance;Rolling walker General ADL Comments: Pt is functionally limited by impaired balance R side inattention/weakness, and decreased safety awareness. He requires Mod A intermittently to maintain standing balance for grooming tasks at sink with RW. Cueing for incorporation of RUE (dominant) into functional tasks. Min-Mod A for functional mobility in room.     Vision Patient Visual Report: No change from baseline Vision Assessment?: No apparent visual deficits     Perception     Praxis      Pertinent Vitals/Pain Pain Assessment: No/denies pain     Hand Dominance Right   Extremity/Trunk Assessment Upper Extremity Assessment Upper Extremity Assessment: RUE deficits/detail (L UE grossly 4-/5 throughout. Denies sensory deficit; appropriately localizes light touch, no extinction.) RUE Deficits / Details: UE grossly 2+ to 3-/5 in R shoulder flexion, 3 to 4-/5 in elbow extension, 3-/5 for wrist and hand. Suspected inattention to R UE. FMC moderately delayed as compared to LUE RUE Sensation: decreased proprioception RUE Coordination: decreased fine motor   Lower Extremity Assessment Lower Extremity Assessment: Defer to PT evaluation;Generalized weakness   Cervical / Trunk Assessment Cervical / Trunk Assessment: Normal   Communication Communication Communication: No difficulties   Cognition Arousal/Alertness: Awake/alert Behavior During Therapy: WFL for tasks assessed/performed Overall Cognitive Status: Within Functional Limits for tasks assessed                                 General Comments: Mildly impulsive and eager to DC home. Intermittently labile/tearful during session. Endorses "these are happy  tears". Easily redirectable to task/topic at hand.   General Comments       Exercises Other Exercises Other Exercises: OT facilitates education on role of OT in acute setting, safe use of AE for functional mobility, falls prevention strategies, and considerations for safely incorporating RUE into functional tasks. OT facilitates standing grooming tasks at sink. See ADL section for addtional details. Other Exercises: Reviewed role of PT and progressive mobility, safety with transfers/gait, use of RW and options for follow up therapies; patient/girlfriend voiced understanding of all information.   Shoulder Instructions      Home Living Family/patient expects to be discharged to:: Private residence Living Arrangements: Spouse/significant other;Non-relatives/Friends Available Help at Discharge: Family;Available PRN/intermittently Type of Home: House Home Access: Stairs to enter [employer] of Steps: 1 Entrance Stairs-Rails: None Home Layout: One level     Bathroom Shower/Tub: Producer, television/film/video: Standard     Home Equipment: None          Prior Functioning/Environment Level of Independence: Independent        Comments: Indep with ADLs, household and community mobilization without assist device; denies fall history; no home O2; does not drive.        OT Problem List: Decreased strength;Decreased coordination;Decreased range of motion;Decreased cognition;Decreased activity tolerance;Decreased safety awareness;Impaired balance (sitting and/or standing);Decreased knowledge of use of DME or AE;Impaired UE functional use      OT Treatment/Interventions: Self-care/ADL training;Therapeutic exercise;Therapeutic activities;DME and/or AE instruction;Patient/family education;Balance training;Energy conservation;Neuromuscular education;Cognitive remediation/compensation;Visual/perceptual remediation/compensation;Manual therapy;Modalities    OT Goals(Current goals  can be found in the care plan section) Acute Rehab OT Goals Patient Stated Goal: to get better OT Goal Formulation: With patient Time For Goal Achievement: 01/16/21  Potential to Achieve Goals: Good ADL Goals Pt Will Perform Grooming: with modified independence;standing;sitting (c LRAD PRN) Pt Will Perform Upper Body Dressing: sitting;with modified independence Pt Will Perform Lower Body Dressing: sit to/from stand;with set-up;with supervision;with caregiver independent in assisting;with adaptive equipment (c LRAD PRN) Pt Will Transfer to Toilet: bedside commode;with modified independence;ambulating (c LRAD PRN) Pt Will Perform Toileting - Clothing Manipulation and hygiene: sit to/from stand;with set-up;with supervision;with adaptive equipment (c LRAD PRN)  OT Frequency: Min 3X/week   Barriers to D/C: Inaccessible home environment          Co-evaluation              AM-PAC OT "6 Clicks" Daily Activity     Outcome Measure Help from another person eating meals?: A Little Help from another person taking care of personal grooming?: A Little Help from another person toileting, which includes using toliet, bedpan, or urinal?: A Lot Help from another person bathing (including washing, rinsing, drying)?: A Lot Help from another person to put on and taking off regular upper body clothing?: A Little Help from another person to put on and taking off regular lower body clothing?: A Lot 6 Click Score: 15   End of Session Equipment Utilized During Treatment: Gait belt;Rolling walker Nurse Communication: Mobility status;Other (comment) (Pt would benefit from SLP consult for cognition and R sided weakness.)  Activity Tolerance: Patient tolerated treatment well;No increased pain Patient left: in bed;with call bell/phone within reach;with bed alarm set  OT Visit Diagnosis: Other abnormalities of gait and mobility (R26.89);Other symptoms and signs involving the nervous system  (R29.898);Hemiplegia and hemiparesis Hemiplegia - Right/Left: Right Hemiplegia - dominant/non-dominant: Dominant Hemiplegia - caused by: Unspecified                Time: 5427-0623 OT Time Calculation (min): 38 min Charges:  OT General Charges $OT Visit: 1 Visit OT Evaluation $OT Eval Moderate Complexity: 1 Mod OT Treatments $Self Care/Home Management : 23-37 mins  Rockney Ghee, M.S., OTR/L Ascom: [phone] 01/02/21, 4:19 PM

## 2021-01-03 DIAGNOSIS — R410 Disorientation, unspecified: Secondary | ICD-10-CM | POA: Diagnosis not present

## 2021-01-03 DIAGNOSIS — I2699 Other pulmonary embolism without acute cor pulmonale: Secondary | ICD-10-CM | POA: Diagnosis not present

## 2021-01-03 LAB — GLUCOSE, CAPILLARY
Glucose-Capillary: 154 mg/dL — ABNORMAL HIGH (ref 70–99)
Glucose-Capillary: 218 mg/dL — ABNORMAL HIGH (ref 70–99)

## 2021-01-03 MED ORDER — RIVAROXABAN 20 MG PO TABS: 20.0000 mg | ORAL_TABLET | Freq: Every day | ORAL | 0 refills | Status: DC

## 2021-01-03 MED ORDER — METOPROLOL SUCCINATE ER 200 MG PO TB24: 200.0000 mg | ORAL_TABLET | Freq: Every day | ORAL | 1 refills | Status: DC

## 2021-01-03 MED ORDER — POTASSIUM CHLORIDE CRYS ER 20 MEQ PO TBCR
40.0000 meq | EXTENDED_RELEASE_TABLET | Freq: Once | ORAL | Status: AC
  Administered 2021-01-03: 40 meq via ORAL
  Filled 2021-01-03: qty 2

## 2021-01-03 MED ORDER — METFORMIN HCL 1000 MG PO TABS: 1000.0000 mg | ORAL_TABLET | Freq: Every day | ORAL | 1 refills | Status: DC

## 2021-01-03 MED ORDER — HALOPERIDOL LACTATE 5 MG/ML IJ SOLN
1.0000 mg | Freq: Once | INTRAMUSCULAR | Status: AC
  Administered 2021-01-03: 1 mg via INTRAVENOUS
  Filled 2021-01-03: qty 1

## 2021-01-03 MED ORDER — HALOPERIDOL LACTATE 5 MG/ML IJ SOLN: 1.0000 mg | Freq: Two times a day (BID) | INTRAMUSCULAR | Status: DC | PRN

## 2021-01-03 MED ORDER — RIVAROXABAN 15 MG PO TABS: 15.0000 mg | ORAL_TABLET | Freq: Two times a day (BID) | ORAL | 0 refills | Status: DC

## 2021-01-03 MED ORDER — FUROSEMIDE 20 MG PO TABS: 20.0000 mg | ORAL_TABLET | Freq: Every day | ORAL | 11 refills | Status: DC

## 2021-01-03 MED ORDER — METOPROLOL SUCCINATE ER 100 MG PO TB24
200.0000 mg | ORAL_TABLET | Freq: Every day | ORAL | Status: DC
  Administered 2021-01-03: 200 mg via ORAL
  Filled 2021-01-03: qty 2

## 2021-01-03 MED ORDER — LOSARTAN POTASSIUM 50 MG PO TABS: 50.0000 mg | ORAL_TABLET | Freq: Every day | ORAL | 1 refills | Status: DC

## 2021-01-03 MED ORDER — FUROSEMIDE 40 MG PO TABS: 40.0000 mg | ORAL_TABLET | Freq: Every day | ORAL | Status: DC

## 2021-01-03 NOTE — Discharge Summary (Addendum)
Jeremy [name] SNK:[id number] DOB: 12-May-1956 DOA: 12/22/2020  PCP: Patient, No Pcp Per (Inactive)  Admit date: 12/22/2020 Discharge date: 01/03/2021  Time spent: 35 minutes  Recommendations for Outpatient Follow-up:  Close f/u with primary care and cardiology Cocaine abstinence Bmp in 1 week    Discharge Diagnoses:  Active Problems:   AKI (acute kidney injury) ([place])   Essential hypertension   History of cocaine use   History of marijuana use   Bilateral pulmonary embolism (HCC)   NSTEMI (non-ST elevated myocardial infarction) ([place])   Acute respiratory failure with hypoxia ([place])   On mechanically assisted ventilation ([place])   Atrial fibrillation with RVR ([place])   Acute delirium   Tobacco use   Diabetes mellitus, type 2 ([place])   Discharge Condition: stable  Diet recommendation: heart healthy  Filed Weights   12/30/20 0500 01/02/21 0500 01/03/21 0212  Weight: 104.9 kg 104 kg 104.2 kg    History of present illness:  Jeremy [name] is a 65 y.o. male with medical history significant of CAD status post CABG, HTN, IIDM, came with new onset of palpitations and shortness of breath.   His symptoms started 1 week ago, gradually getting worse.  Denies any chest pain, no fever or chills.  Symptoms got worse with activity, meantime he also developed mild cough with whitish phlegm.  Denies any recent travels, small 1 pack cigarettes per week.  Hospital Course:  Hx CAD s/p cabg, htn, dm, polysubstance abuse (cocaine, MJ), presenting 7/11 w/ sob and palpitations, found to have PE, for worsening agigation evaluated by vascular who proceeded w/ thrombectomy on 7/13, admitted to ICU for worsening agigation and inability to protect airway, intubated on 7/14 and started on precedex, estubated 7/19. Transferred to hospitalist service 7/20. Hospital course marked by continued delirium. PT/OT advising CIR vs snf but patient very much wants discharge home. Patient with waxing and waning delirium so  discharge plans made with wife, wife also declines snf and CIR, she wants discharge home. Patient weaned to room air and DOAC was continued. New diagnosis paroxysmal a fib initially with rvr, metoprolol started by cardiology (with severe hfref they chose this over ccb, though noted patient with ongoing cocaine use). Patient with ongoing cocaine use, cocaine positive on admission, this will obviously complicate his care.  Procedures: thombectomy  Consultations: Vascular surgery, cardiology  Discharge Exam: Vitals:   01/03/21 0742 01/03/21 1127  BP: 118/72 111/67  Pulse: (!) 108 (!) 110  Resp: 18 18  Temp: (!) 97.4 F (36.3 C) (!) 97.4 F (36.3 C)  SpO2: 99% 96%    General exam: NAD Respiratory system: Clear to auscultation. Respiratory effort normal. Cardiovascular system: S1 & S2 heard, tachycardic. No JVD, soft systolic murmur, no rubs, gallops or clicks. 1+ pedal edema Gastrointestinal system: Abdomen is obese, soft and nontender. No organomegaly or masses felt. Normal bowel sounds heard. Central nervous system: Alert , oriented to self Extremities: Symmetric 5 x 5 power. Skin: No rashes, lesions or ulcers Psychiatry: intermittently agitated, calm at time of exam  Discharge Instructions   Discharge Instructions     Diet - low sodium heart healthy   Complete by: As directed    Increase activity slowly   Complete by: As directed    No wound care   Complete by: As directed       Allergies as of 01/03/2021       Reactions   Amoxicillin Swelling   Keflex [cephalexin] Rash        Medication List  STOP taking these medications    glimepiride 2 MG tablet Commonly known as: AMARYL   linezolid 600 MG tablet Commonly known as: ZYVOX   naproxen sodium 220 MG tablet Commonly known as: ALEVE   sitaGLIPtin 100 MG tablet Commonly known as: Januvia       TAKE these medications    blood glucose meter kit and supplies Kit Dispense based on patient and  insurance preference. Use up to four times daily as directed. (FOR ICD-9 250.00, 250.01).   furosemide 20 MG tablet Commonly known as: Lasix Take 1 tablet (20 mg total) by mouth daily.   hydrOXYzine 25 MG tablet Commonly known as: ATARAX/VISTARIL Take 25 mg by mouth 3 (three) times daily as needed for itching.   losartan 50 MG tablet Commonly known as: COZAAR Take 1 tablet (50 mg total) by mouth daily.   metFORMIN 1000 MG tablet Commonly known as: GLUCOPHAGE Take 1 tablet (1,000 mg total) by mouth daily with breakfast. What changed: when to take this   metoprolol 200 MG 24 hr tablet Commonly known as: TOPROL-XL Take 1 tablet (200 mg total) by mouth daily. Take with or immediately following a meal. Start taking on: January 04, 2021   Rivaroxaban 15 MG Tabs tablet Commonly known as: XARELTO Take 1 tablet (15 mg total) by mouth 2 (two) times daily with a meal for 20 days.   rivaroxaban 20 MG Tabs tablet Commonly known as: XARELTO Take 1 tablet (20 mg total) by mouth daily with supper. Start taking on: January 23, 2021               Durable Medical Equipment  (From admission, onward)           Start     Ordered   01/03/21 1406  DME Walker  Once       Question Answer Comment  Walker: With 5 Inch Wheels   Patient needs a walker to treat with the following condition CVA (cerebral vascular accident) ([place])      01/03/21 1411           Allergies  Allergen Reactions   Amoxicillin Swelling   Keflex [Cephalexin] Rash    Follow-up Information     Algernon Huxley, MD Follow up in 1 month(s).   Specialties: Vascular Surgery, Radiology, Interventional Cardiology Why: Can see Dew or Arna Medici. Consult. No studies needed. Contact information: [place] [address] [phone]         Yolonda Kida, MD. Schedule an appointment as soon as possible for a visit.   Specialties: Cardiology, Internal Medicine Contact information: [place] [address] [place], [facility] Follow up.   Why: This is a primary care provider you can call to schedule an appointment Contact information: [place] [address] [phone]                  The results of significant diagnostics from this hospitalization (including imaging, microbiology, ancillary and laboratory) are listed below for reference.    Significant Diagnostic Studies: DG Chest 2 View  Result Date: 12/22/2020 CLINICAL DATA:  Dyspnea, palpitations EXAM: CHEST - 2 VIEW COMPARISON:  02/12/2018 chest radiograph. FINDINGS: Intact sternotomy wires. Stable cardiomediastinal silhouette with mild cardiomegaly. No pneumothorax. No pleural effusion. Mild pulmonary edema. No acute consolidative airspace disease. IMPRESSION: Mild congestive heart failure. Electronically Signed   By: Ilona Sorrel M.D.   On:  12/22/2020 14:54   DG Abd 1 View  Result Date: 12/25/2020 CLINICAL DATA:  Check gastric catheter placement EXAM: ABDOMEN - 1 VIEW COMPARISON:  None. FINDINGS: Gastric catheter is noted within the stomach. Scattered large and small bowel gas is noted. No free air is seen. IMPRESSION: Gastric catheter within the stomach. Electronically Signed   By: Inez Catalina M.D.   On: 12/25/2020 18:35   CT HEAD WO CONTRAST  Result Date: 12/26/2020 CLINICAL DATA:  65 year old male status post acute pulmonary emboli. Altered mental status with age indeterminate hypodensity in the right caudate on head CT yesterday. Found with altered mental status now. Intubated. EXAM: CT HEAD WITHOUT CONTRAST TECHNIQUE: Contiguous axial images were obtained from the base of the skull through the vertex without intravenous contrast. COMPARISON:  Head CT 12/25/2020. FINDINGS: Brain: Unchanged asymmetric hypodensity in the right caudate (series 6, image 16) and patchy bilateral cerebral white matter hypodensity since yesterday. Focal cystic  encephalomalacia near the body of the right corpus callosum is stable on sagittal image 25. Small chronic appearing lacunar infarct of the right cerebellum on series 6, image 23 is stable. No midline shift, ventriculomegaly, mass effect, evidence of mass lesion, intracranial hemorrhage or evidence of cortically based acute infarction. No cortical encephalomalacia identified. Vascular: Calcified atherosclerosis at the skull base. No suspicious intracranial vascular hyperdensity. Skull: Stable and intact. Sinuses/Orbits: Visualized paranasal sinuses and mastoids are stable and well aerated. Other: No acute orbit or scalp soft tissue finding. Fluid in the visible pharynx related to intubation. IMPRESSION: 1. Unchanged age indeterminate small vessel disease in the cerebral hemispheres, greater on the right. 2. No new intracranial abnormality. Electronically Signed   By: Genevie Ann M.D.   On: 12/26/2020 04:33   CT HEAD WO CONTRAST  Result Date: 12/25/2020 CLINICAL DATA:  Initial evaluation for altered mental status, agitation. Prior pulmonary thrombectomy. EXAM: CT HEAD WITHOUT CONTRAST TECHNIQUE: Contiguous axial images were obtained from the base of the skull through the vertex without intravenous contrast. COMPARISON:  None available. FINDINGS: Brain: Generalized age-related cerebral atrophy. Patchy and confluent hypodensity involving the periventricular and deep white matter both cerebral hemispheres, most consistent with chronic small vessel ischemic disease, moderately advanced in nature. Few scattered superimposed remote lacunar infarcts present about the hemispheric cerebral white matter. Few small remote bilateral cerebellar infarcts noted. Approximate 1.5 cm hypodensity noted involving the anterior right caudate/internal capsule, somewhat age indeterminate, and could be chronic (series 3, image 16). No other evidence for acute large vessel territory infarct. No acute intracranial hemorrhage. No mass lesion or  midline shift. No hydrocephalus or extra-axial fluid collection. Vascular: No hyperdense vessel. Calcified atherosclerosis present at the skull base. Skull: Scalp soft tissues within normal limits.  Calvarium intact. Sinuses/Orbits: Globes and orbital soft tissues demonstrate no acute finding. Paranasal sinuses are largely clear. No mastoid effusion. Other: None. IMPRESSION: 1. 1.5 cm hypodensity involving the anterior right caudate/internal capsule, indeterminate. While this finding may be chronic in nature, possible acute to subacute ischemic change is difficult to exclude. If there is clinical concern for possible acute infarct at this location, this could be further assessed with dedicated MRI as clinically warranted. 2. No other acute intracranial abnormality. 3. Generalized age-related cerebral atrophy with moderate chronic small vessel ischemic disease, with a few additional scattered remote lacunar infarcts as above. Electronically Signed   By: Jeannine Boga M.D.   On: 12/25/2020 03:22   CT Angio Chest PE W and/or Wo Contrast  Result Date: 12/22/2020 CLINICAL DATA:  Palpitations,  chronic cough EXAM: CT ANGIOGRAPHY CHEST WITH CONTRAST TECHNIQUE: Multidetector CT imaging of the chest was performed using the standard protocol during bolus administration of intravenous contrast. Multiplanar CT image reconstructions and MIPs were obtained to evaluate the vascular anatomy. CONTRAST:  76m OMNIPAQUE IOHEXOL 350 MG/ML SOLN COMPARISON:  Chest radiograph from earlier today. 10/02/2011 chest CT. FINDINGS: Cardiovascular: The study is high quality for the evaluation of pulmonary embolism. No saddle embolus. Acute moderate clot burden bilateral pulmonary emboli including segmental and subsegmental left upper and left lower lobe branches, distal right pulmonary artery and lobar, segmental and subsegmental right upper lobe branches. Atherosclerotic nonaneurysmal thoracic aorta. Top-normal caliber main pulmonary  artery (3.1 cm diameter). Mild-to-moderate cardiomegaly. No significant pericardial fluid/thickening. RV/LV ratio 0.76, within normal limits. Mediastinum/Nodes: No discrete thyroid nodules. Unremarkable esophagus. No pathologically enlarged axillary, mediastinal or hilar lymph nodes. Lungs/Pleura: No pneumothorax. No pleural effusion. A small portion of the dependent right lung base was inadvertently excluded from the scan. Posterior right middle lobe 1.0 cm solid pulmonary nodule (series 6/image 62), not substantially changed since 02/10/2018 CT abdomen study, considered benign. No acute consolidative airspace disease, lung masses or additional significant pulmonary nodules. Upper abdomen: Mild contrast reflux into the IVC and hepatic veins. Musculoskeletal: No aggressive appearing focal osseous lesions. Intact sternotomy wires. Mild thoracic spondylosis. Review of the MIP images confirms the above findings. IMPRESSION: 1. Acute moderate clot burden bilateral pulmonary emboli, extending proximally to the distal right pulmonary artery level. No saddle embolus. Normal RV/LV ratio. 2. Mild-to-moderate cardiomegaly. Mild contrast reflux into the IVC and hepatic veins. 3. Aortic Atherosclerosis (ICD10-I70.0). Critical Value/emergent results were called by telephone at the time of interpretation on 12/22/2020 at 4:57 pm to provider EValora Piccolo MD, who verbally acknowledged these results. Electronically Signed   By: JIlona SorrelM.D.   On: 12/22/2020 17:00   CT CERVICAL SPINE WO CONTRAST  Result Date: 12/26/2020 CLINICAL DATA:  65year old male status post acute pulmonary emboli. Altered mental status with age indeterminate hypodensity in the right caudate on head CT yesterday. Found with altered mental status now. Intubated. EXAM: CT CERVICAL SPINE WITHOUT CONTRAST TECHNIQUE: Multidetector CT imaging of the cervical spine was performed without intravenous contrast. Multiplanar CT image reconstructions were also  generated. COMPARISON:  Head CT today. FINDINGS: Alignment: Mild reversal of cervical lordosis. Cervicothoracic junction alignment is within normal limits. Bilateral posterior element alignment is within normal limits. Skull base and vertebrae: Visualized skull base is intact. No atlanto-occipital dissociation. C1 and C2 appear intact and aligned. No acute osseous abnormality identified. Soft tissues and spinal canal: No prevertebral fluid or swelling. No visible canal hematoma. Intubated and enteric tube in place. Bubbly opacity in the pharynx. Bilateral calcified carotid artery atherosclerosis in the neck. Disc levels: Moderate to severe cervical disc and endplate degeneration CP3-A2and C6-C7. Evidence of mild spinal stenosis at those levels. Mild to moderate degeneration elsewhere. Upper chest: T1 level appears intact. Negative visible lung apices. The endotracheal and enteric tubes course appropriately into the trachea and esophagus, respectively. IMPRESSION: 1. No acute osseous abnormality identified in the cervical spine. 2. 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 3. Satisfactory visible endotracheal and enteric tubes. Electronically Signed   By: HGenevie AnnM.D.   On: 12/26/2020 04:36   MR BRAIN WO CONTRAST  Result Date: 01/02/2021 CLINICAL DATA:  Neuro deficit, acute, stroke suspected asymetric weakness. Additional provided: Confusion and delirium. EXAM: MRI HEAD WITHOUT CONTRAST TECHNIQUE: Multiplanar, multiecho pulse sequences of  the brain and surrounding structures were obtained without intravenous contrast. COMPARISON:  Brain MRI 12/27/2020. FINDINGS: Brain: Mild intermittent motion degradation. Mild cerebral and cerebellar atrophy. Redemonstrated chronic lacunar infarcts within the bilateral cerebral hemispheric white matter, right caudate nucleus and anterior limb of right internal capsule. Background moderate/advanced patchy and confluent T2/FLAIR  hyperintensity within the cerebral white matter, nonspecific but compatible with chronic small vessel ischemic disease. Redemonstrated small chronic infarcts within the bilateral cerebellar hemispheres. A chronic microhemorrhage within the left frontal lobe white matter was better appreciated on the prior examination (at which time SWI imaging was performed). There is no acute infarct. No evidence of an intracranial mass. No extra-axial fluid collection. No midline shift. Vascular: Expected proximal arterial flow voids. Skull and upper cervical spine: No focal marrow lesion. Sinuses/Orbits: Visualized orbits show no acute finding. Trace bilateral maxillary sinus mucosal thickening. Other: Small left mastoid effusion. IMPRESSION: Mildly motion degraded exam. No evidence of acute intracranial abnormality. Redemonstrated chronic lacunar infarcts within the bilateral cerebral hemispheric white matter, right basal ganglia and anterior limb of right internal capsule. Stable background moderate/advanced chronic small vessel ischemic changes within the cerebral white matter. Redemonstrated small chronic infarcts within the bilateral cerebellar hemispheres. Small left mastoid effusion. Electronically Signed   By: Kellie Simmering DO   On: 01/02/2021 11:32   MR BRAIN WO CONTRAST  Result Date: 12/27/2020 CLINICAL DATA:  Stroke follow-up EXAM: MRI HEAD WITHOUT CONTRAST TECHNIQUE: Multiplanar, multiecho pulse sequences of the brain and surrounding structures were obtained without intravenous contrast. COMPARISON:  CT head 12/26/2020 FINDINGS: Brain: Negative for acute infarct. Moderate chronic microvascular ischemic change throughout the white matter bilaterally. Small chronic infarcts in the cerebellum bilaterally. Negative for mass lesion. Chronic microhemorrhage in the left frontal white matter. No fluid collection or midline shift. Vascular: Normal arterial flow voids Skull and upper cervical spine: Negative Sinuses/Orbits:  Negative Other: None IMPRESSION: Negative for acute infarct. Moderate chronic microvascular ischemic change. Electronically Signed   By: Franchot Gallo M.D.   On: 12/27/2020 12:58   US RENAL  Result Date: 12/27/2020 CLINICAL DATA:  Acute kidney injury. EXAM: RENAL / URINARY TRACT ULTRASOUND COMPLETE COMPARISON:  Abdomen and pelvis CT dated 02/10/2018 FINDINGS: Right Kidney: Renal measurements: 9.0 x 5.2 x 4.1 cm = volume: 100 mL. Echogenicity within normal limits. No mass or hydronephrosis visualized. Left Kidney: Renal measurements: 11.6 x 6.3 x 5.7 cm = volume: 219 mL. Echogenicity within normal limits. No mass or hydronephrosis visualized. Bladder: No urine in the bladder with a Foley catheter in place. Other: None. IMPRESSION: Normal examination. Electronically Signed   By: Claudie Revering M.D.   On: 12/27/2020 11:36   PERIPHERAL VASCULAR CATHETERIZATION  Result Date: 12/24/2020 See surgical note for result.  US Venous Img Lower Bilateral (DVT)  Result Date: 12/23/2020 CLINICAL DATA:  Known pulmonary emboli. EXAM: BILATERAL LOWER EXTREMITY VENOUS DOPPLER ULTRASOUND TECHNIQUE: Gray-scale sonography with graded compression, as well as color Doppler and duplex ultrasound were performed to evaluate the lower extremity deep venous systems from the level of the common femoral vein and including the common femoral, femoral, profunda femoral, popliteal and calf veins including the posterior tibial, peroneal and gastrocnemius veins when visible. The superficial great saphenous vein was also interrogated. Spectral Doppler was utilized to evaluate flow at rest and with distal augmentation maneuvers in the common femoral, femoral and popliteal veins. COMPARISON:  None. FINDINGS: RIGHT LOWER EXTREMITY Common Femoral Vein: No evidence of thrombus. Normal compressibility, respiratory phasicity and response to augmentation. Saphenofemoral Junction:  No evidence of thrombus. Normal compressibility and flow on color  Doppler imaging. Profunda Femoral Vein: No evidence of thrombus. Normal compressibility and flow on color Doppler imaging. Femoral Vein: No evidence of thrombus. Normal compressibility, respiratory phasicity and response to augmentation. Popliteal Vein: No evidence of thrombus. Normal compressibility, respiratory phasicity and response to augmentation. Calf Veins: No evidence of thrombus. Normal compressibility and flow on color Doppler imaging. Superficial Great Saphenous Vein: No evidence of thrombus. Normal compressibility. Venous Reflux:  None. Other Findings:  None. LEFT LOWER EXTREMITY Common Femoral Vein: No evidence of thrombus. Normal compressibility, respiratory phasicity and response to augmentation. Saphenofemoral Junction: No evidence of thrombus. Normal compressibility and flow on color Doppler imaging. Profunda Femoral Vein: No evidence of thrombus. Normal compressibility and flow on color Doppler imaging. Femoral Vein: No evidence of thrombus. Normal compressibility, respiratory phasicity and response to augmentation. Popliteal Vein: No evidence of thrombus. Normal compressibility, respiratory phasicity and response to augmentation. Calf Veins: No evidence of thrombus. Normal compressibility and flow on color Doppler imaging. Superficial Great Saphenous Vein: No evidence of thrombus. Normal compressibility. Venous Reflux:  None. Other Findings:  None. IMPRESSION: No evidence of deep venous thrombosis in either lower extremity. Electronically Signed   By: Inez Catalina M.D.   On: 12/23/2020 02:56   DG Chest Port 1 View  Result Date: 12/29/2020 CLINICAL DATA:  Acute respiratory failure EXAM: PORTABLE CHEST 1 VIEW COMPARISON:  12/26/2020 FINDINGS: Endotracheal tube 6.8 cm above the carina. Esophageal Doppler POBA within the expected mid esophagus. Nasogastric tube extends into the upper abdomen beyond the margin of the examination. Right upper extremity PICC line tip noted within the superior vena  cava. Pulmonary insufflation is stable. Lungs are clear. No pneumothorax. Possible tiny right pleural effusion. Coronary artery bypass grafting has been performed. Mild cardiomegaly is stable. There is central pulmonary vascular engorgement without overt pulmonary edema. IMPRESSION: Stable support lines and tubes. Stable pulmonary insufflation. Stable cardiomegaly. Engorgement of the central pulmonary vessels in keeping with mild cardiogenic failure or volume overload. Electronically Signed   By: Fidela Salisbury MD   On: 12/29/2020 04:14   DG Chest Port 1 View  Result Date: 12/26/2020 CLINICAL DATA:  Status post peripherally inserted central catheter (PICC) central line placement. EXAM: PORTABLE CHEST 1 VIEW COMPARISON:  Prior chest radiographs 12/25/2020 and earlier. FINDINGS: A right-sided PICC is now present with tip projecting at the level of the lower SVC. The ET tube terminates just below the level of the clavicular heads. An enteric tube passes below the level of the left hemidiaphragm with tip excluded from the field of view. Prior median sternotomy and CABG. Unchanged cardiomegaly. Central pulmonary vascular congestion. Minimal atelectasis at the right lung base. No sizable pleural effusion or evidence of pneumothorax. No acute bony abnormality identified. IMPRESSION: Right-sided PICC with tip projecting at the level of the lower SVC. Position of additional lines/tubes, as described. Unchanged cardiomegaly with central pulmonary vascular congestion. Minimal atelectasis at the right lung base. Electronically Signed   By: Kellie Simmering DO   On: 12/26/2020 13:36   DG Chest Port 1 View  Result Date: 12/25/2020 CLINICAL DATA:  Status post intubation, initial encounter EXAM: PORTABLE CHEST 1 VIEW COMPARISON:  12/22/2020 FINDINGS: Endotracheal tube is noted in satisfactory position at the level of the aortic arch. Gastric catheter extends into the stomach. Postsurgical changes are again seen. Cardiomegaly is  again noted. Mild linear atelectasis is noted in the mid lungs bilaterally stable from the prior exam. No sizable  effusion is seen. Mild vascular congestion is seen. IMPRESSION: Mild vascular congestion stable from the prior exam. Tubes and lines as described. Electronically Signed   By: Inez Catalina M.D.   On: 12/25/2020 18:35   [place] EKG SITE RITE  Result Date: 12/26/2020 If Site Rite image not attached, placement could not be confirmed due to current cardiac rhythm.   Microbiology: Recent Results (from the past 240 hour(s))  MRSA Next Gen by PCR, Nasal     Status: None   Collection Time: 12/24/20 11:02 PM   Specimen: Nasal Mucosa; Nasal Swab  Result Value Ref Range Status   MRSA by PCR Next Gen NOT DETECTED NOT DETECTED Final    Comment: (NOTE) The GeneXpert MRSA Assay (FDA approved for NASAL specimens only), is one component of a comprehensive MRSA colonization surveillance program. It is not intended to diagnose MRSA infection nor to guide or monitor treatment for MRSA infections. Test performance is not FDA approved in patients less than 45 years old. Performed at [facility], [address] [place], [place] [address]   Culture, Respiratory w Gram Stain     Status: None   Collection Time: 12/26/20 11:45 AM   Specimen: Tracheal Aspirate; Respiratory  Result Value Ref Range Status   Specimen Description   Final    TRACHEAL ASPIRATE Performed at [facility], [address], [place], [place] [address]    Special Requests   Final    NONE Performed at [facility], [place], [place] [address]    Gram Stain   Final    ABUNDANT WBC PRESENT,BOTH PMN AND MONONUCLEAR RARE GRAM POSITIVE COCCI    Culture   Final    RARE Normal respiratory flora-no Staph aureus or Pseudomonas seen Performed at [facility], [place] [address] [place], [place] [address]    Report Status 12/28/2020 FINAL  Final  Culture, Respiratory w Gram Stain      Status: None   Collection Time: 12/28/20  3:56 PM   Specimen: SPU; Respiratory  Result Value Ref Range Status   Specimen Description   Final    SPUTUM Performed at [facility], [address], [place], [place] [address]    Special Requests   Final    NONE Performed at [facility], [address] [place], [place] [address]    Gram Stain   Final    RARE WBC PRESENT, PREDOMINANTLY PMN RARE GRAM POSITIVE COCCI IN PAIRS IN CLUSTERS RARE GRAM NEGATIVE RODS    Culture   Final    MODERATE KLEBSIELLA PNEUMONIAE NO STAPHYLOCOCCUS AUREUS ISOLATED No Pseudomonas species isolated Performed at [facility] [address] [place],  [address]    Report Status 12/31/2020 FINAL  Final   Organism ID, Bacteria KLEBSIELLA PNEUMONIAE  Final      Susceptibility   Klebsiella pneumoniae - MIC*    AMPICILLIN >=32 RESISTANT Resistant     CEFAZOLIN <=4 SENSITIVE Sensitive     CEFEPIME <=0.12 SENSITIVE Sensitive     CEFTAZIDIME <=1 SENSITIVE Sensitive     CEFTRIAXONE <=0.25 SENSITIVE Sensitive     CIPROFLOXACIN <=0.25 SENSITIVE Sensitive     GENTAMICIN <=1 SENSITIVE Sensitive     IMIPENEM <=0.25 SENSITIVE Sensitive     TRIMETH/SULFA <=20 SENSITIVE Sensitive     AMPICILLIN/SULBACTAM 4 SENSITIVE Sensitive     PIP/TAZO <=4 SENSITIVE Sensitive     * MODERATE KLEBSIELLA PNEUMONIAE     Labs: Basic Metabolic Panel:  Recent Labs  Lab 12/29/20 0430 12/30/20 0500 12/31/20 1154 01/01/21 0506 01/02/21 0617  NA 139 142 141 143 143  K 4.8 3.9 5.0 3.7 3.1*  CL 110 106 107 108 106  CO2 _0 GLUCOSE 209* 197* 155* 129* 170*  BUN 35* 36* 31* 24* 17  CREATININE 1.36* 1.29* 0.89 1.07 1.18  CALCIUM 8.9 9.1 9.2 9.0 9.3  MG 2.1 1.9 2.0 2.2 2.0  PHOS 3.5 3.6 3.4 3.7 3.4   Liver Function Tests: No results for input(s): AST, ALT, ALKPHOS, BILITOT, PROT, ALBUMIN in the last 168 hours. No results for input(s): LIPASE, AMYLASE in the last 168 hours. No results  for input(s): AMMONIA in the last 168 hours. CBC: Recent Labs  Lab 12/28/20 0420 12/29/20 0430 12/30/20 0500 01/02/21 0617  WBC 6.9 7.6 8.0 8.2  HGB 11.3* 10.5* 11.1* 13.5  HCT 35.4* 32.9* 34.2* 40.9  MCV 99.7 99.7 98.8 96.2  PLT 182 165 208 257   Cardiac Enzymes: No results for input(s): CKTOTAL, CKMB, CKMBINDEX, TROPONINI in the last 168 hours. BNP: BNP (last 3 results) Recent Labs    12/22/20 1421  BNP 298.4*    ProBNP (last 3 results) No results for input(s): PROBNP in the last 8760 hours.  CBG: Recent Labs  Lab 01/02/21 0726 01/02/21 1625 01/02/21 2125 01/03/21 0906 01/03/21 1317  GLUCAP 188* 164* 144* 154* 218*       Signed:  Desma Maxim MD.  [facility] 01/03/2021, 2:17 PM

## 2021-01-03 NOTE — Progress Notes (Signed)
Occupational Therapy Treatment Patient Details Name: Jeremy [name] MRN: [id number] DOB: [birth year] Today's Date: 01/03/2021    History of present illness presented to ER secondary to palpitations, SOB; admitted for management of acute hypoxic respiratory failure related to bilat PE.  S/p thrombectomy 7/13.  Hospital course further complicated by agitation requiring transfer to CCU for precedex drip; ultimately intubated 7/14-7/19/22 for airway management.   OT comments  Pt seen by OT to follow up regarding safety and functional independence during ADL management. Upon arrival to room pt awake/alert, semi-supine in bed with significant other present. Pt denies pain and agreeable to OT tx session. Pt eager to complete standing grooming tasks, he states "I want to use the walker like you told me". Pt continues to be functionally limited by R sided weakness, decreased balance and decaeased safety awareness. He requires MIN A for functional mobility and standing grooming tasks this date. See ADL section for additional detail. Pt/caregiver educated on falls preventions strategies, considerations for home health vs. Inpatient therapy, and safe use of AE/DME for ADL management. Both return verbalize understanding. Pt eager to DC home this date. Pt making good progress toward goals. Pt continues to benefit from skilled OT services to maximize return to PLOF and minimize risk of future falls, injury, caregiver burden, and readmission. Will continue to follow POC as written. Discharge recommendation remains appropriate.    Follow Up Recommendations  CIR    Equipment Recommendations  3 in 1 bedside commode    Recommendations for Other Services      Precautions / Restrictions Precautions Precautions: Fall Restrictions Weight Bearing Restrictions: No       Mobility Bed Mobility Overal bed mobility: Needs Assistance Bed Mobility: Supine to Sit     Supine to sit: Min assist;HOB elevated           Transfers Overall transfer level: Needs assistance Equipment used: Rolling walker (2 wheeled) Transfers: Sit to/from Stand Sit to Stand: Min assist              Balance Overall balance assessment: Needs assistance Sitting-balance support: No upper extremity supported;Feet supported Sitting balance-Leahy Scale: Good       Standing balance-Leahy Scale: Fair Standing balance comment: Occasional LOB with weight shift.                           ADL either performed or assessed with clinical judgement   ADL Overall ADL's : Needs assistance/impaired Eating/Feeding: Set up;Supervision/ safety   Grooming: Standing;Cueing for safety;Cueing for sequencing;Minimal assistance;Min guard Grooming Details (indicate cue type and reason): Pt able to stand at sink with close supervision for safety during grooming task, min posterior LOB appeciated, pt able to self-correct with cuieng.                               General ADL Comments: Pt continues to be functionally limited by impaired balance, R side weakness, and decreased safety awareness. He requires MIN A for STS and MIN A -CGA for functional mobility and standing ADL management.     Vision Patient Visual Report: No change from baseline     Perception     Praxis      Cognition Arousal/Alertness: Awake/alert Behavior During Therapy: WFL for tasks assessed/performed Overall Cognitive Status: Impaired/Different from baseline  General Comments: Mildly impulsive and eager to DC home.        Exercises Other Exercises Other Exercises: OT facilitates reinforcement of prior education on safe use of AE for functional mobility, falls prevention strategies, and considerations for safely incorporating RUE into functional tasks. OT facilitates standing grooming tasks at sink. See ADL section for addtional details.   Shoulder Instructions       General Comments       Pertinent Vitals/ Pain       Pain Assessment: No/denies pain  Home Living   Living Arrangements: Spouse/significant other Available Help at Discharge: Family;Available 24 hours/day Type of Home: House Home Access: Stairs to enter [employer] of Steps: 1 Entrance Stairs-Rails: None Home Layout: One level     Bathroom Shower/Tub: Chief Strategy Officer: Standard Bathroom Accessibility: Yes How Accessible: Accessible via walker        Lives With: Spouse    Prior Functioning/Environment              Frequency  Min 3X/week        Progress Toward Goals  OT Goals(current goals can now be found in the care plan section)  Progress towards OT goals: Progressing toward goals  Acute Rehab OT Goals Patient Stated Goal: to get better OT Goal Formulation: With patient Time For Goal Achievement: 01/16/21 Potential to Achieve Goals: Good  Plan Discharge plan remains appropriate;Frequency remains appropriate    Co-evaluation                 AM-PAC OT "6 Clicks" Daily Activity     Outcome Measure   Help from another person eating meals?: A Little Help from another person taking care of personal grooming?: A Little Help from another person toileting, which includes using toliet, bedpan, or urinal?: A Lot Help from another person bathing (including washing, rinsing, drying)?: A Lot Help from another person to put on and taking off regular upper body clothing?: A Little Help from another person to put on and taking off regular lower body clothing?: A Lot 6 Click Score: 15    End of Session Equipment Utilized During Treatment: Gait belt;Rolling walker  OT Visit Diagnosis: Other abnormalities of gait and mobility (R26.89);Other symptoms and signs involving the nervous system (R29.898);Hemiplegia and hemiparesis Hemiplegia - Right/Left: Right Hemiplegia - dominant/non-dominant: Dominant Hemiplegia - caused by: Unspecified   Activity  Tolerance Patient tolerated treatment well;No increased pain   Patient Left in bed;with call bell/phone within reach;with bed alarm set   Nurse Communication          Time: 5621-3086 OT Time Calculation (min): 17 min  Charges: OT General Charges $OT Visit: 1 Visit OT Treatments $Self Care/Home Management : 8-22 mins  Rockney Ghee, M.S., OTR/L Ascom: [phone] 01/03/21, 2:31 PM

## 2021-01-03 NOTE — TOC Progression Note (Signed)
Transition of Care [facility]) - Progression Note    Patient Details  Name: Jeremy [name] MRN: [id number] Date of Birth: [birth year]  Transition [facility]) CM/SW Contact  Maree Krabbe, LCSW Phone Number: 01/03/2021, 2:21 PM  Clinical Narrative:   [place] Points SLP, states that pt and pt's wife (at bedside) wants to pursue outpatient PT, at this time they are not interested in CIR. Referral completed and signed by MD and faxed to [place] location. Walker ordered through [employer] to be delivered to bedside.    Expected Discharge Plan: Home w Home Health Services Barriers to Discharge: Continued Medical Work up  Expected Discharge Plan and Services Expected Discharge Plan: Home w Home Health Services In-house Referral: Clinical Social Work   Post Acute Care Choice: Home Health Living arrangements for the past 2 months: Single Family Home Expected Discharge Date: 01/03/21                                     Social Determinants of Health (SDOH) Interventions    Readmission Risk Interventions No flowsheet data found.

## 2021-01-03 NOTE — Progress Notes (Addendum)
PROGRESS NOTE    Jeremy [name]  MGQ:[id number] DOB: 1956/01/15 DOA: 12/22/2020 PCP: Patient, No Pcp Per (Inactive)  Outpatient Specialists: [facility]    Brief Narrative:   Hx CAD s/p cabg, htn, dm, polysubstance abuse (cocaine, MJ), presenting 7/11 w/ sob and palpitations, found to have PE, for worsening agigation evaluated by vascular who proceeded w/ thrombectomy on 7/13, admitted to ICU for worsening agigation and inability to protect airway, intubated on 7/14 and started on precedex, estubated 7/19.   Assessment & Plan:   Active Problems:   AKI (acute kidney injury) ([place])   Essential hypertension   History of cocaine use   History of marijuana use   Bilateral pulmonary embolism (HCC)   NSTEMI (non-ST elevated myocardial infarction) ([place])   Acute respiratory failure with hypoxia (HCC)   On mechanically assisted ventilation (HCC)   Atrial fibrillation with RVR ([place])   Acute delirium   Tobacco use   Diabetes mellitus, type 2 ([place])  # Pulmonary embolism S/p thrombectomy. Symptomatically improved. Weaned off o2 - cont xarelto  # Acute hypoxic respiratory failure 2/2 PE. 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 MRI brain nothing acute, repeat today for gait dysfunction shows no new infarct but various remote infarcts. Intermittently agitated, wants to leave. Tsh wnl. Was much more agitated and delirious earlier in hospital course, required precedex gtt. - Redirection - haldol low dose prn, qtc 460 on presentation - f/u b12 - diuretics and beta blockers unfortunately also can cause delirium, may need to touch base w/ cardiology about alternatives  # Klebsiella pneumonia Klebsiella growing on trachial aspirate. 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 Cardiology following. Appears somewhat fluid overloaded - cont metoprolol - started lasix 40 qd  # Hypokalemia Refused labs today - bmp when able  # Acute kidney injury Resolved - monitor  # T2DM - cont lantus to 10 - SSI  # Paroxysmal a fib New dx. Currently a fib hr in low 100s - cont xarelto.  - consolidate metop to 200 qd - monitor, may need additional agent  # Cocaine abuse complicates care and prognosis. TOC has seen   DVT prophylaxis: xarelto Code Status: full Family Communication: wife updated @ bedside 7/22  Level of care: Progressive Cardiac Status is: Inpatient  Remains inpatient appropriate because:Inpatient level of care appropriate due to severity of illness  Dispo: The patient is from: Home              Anticipated d/c is to: tbd              Patient currently is not medically stable to d/c.   Difficult to place patient No   Consultants:  Cardiology, vascular surgery  Procedures: thrombectomy  Antimicrobials:  Zosyn>ancef    Subjective: This morning says breathing improved, still a bit confused but less than yesterday, tolerating diet  Objective: Vitals:   01/03/21 0212 01/03/21 0308 01/03/21 0742 01/03/21 1127  BP:  103/90 118/72 111/67  Pulse:  (!) 110 (!) 108 (!) 110  Resp:  _0 Temp:  98.7 F (37.1 C) (!) 97.4 F (36.3 C) (!) 97.4 F (36.3 C)  TempSrc:      SpO2:  95% 99% 96%  Weight: 104.2 kg     Height:  Intake/Output Summary (Last 24 hours) at 01/03/2021 1214 Last data filed at 01/03/2021 1127 Gross per 24 hour  Intake 840 ml  Output 1975 ml  Net -1135 ml   Filed Weights   12/30/20 0500 01/02/21 0500 01/03/21 0212  Weight: 104.9 kg 104 kg 104.2 kg    Examination:  General exam: NAD Respiratory system: Clear to auscultation. Respiratory effort normal. Cardiovascular system: S1 & S2 heard, tachycardic. No JVD, soft systolic murmur, no rubs, gallops or clicks. 1+ pedal edema Gastrointestinal  system: Abdomen is obese, soft and nontender. No organomegaly or masses felt. Normal bowel sounds heard. Central nervous system: Alert , oriented to self Extremities: Symmetric 5 x 5 power. Skin: No rashes, lesions or ulcers Psychiatry: calm    Data Reviewed: I have personally reviewed following labs and imaging studies  CBC: Recent Labs  Lab 12/28/20 0420 12/29/20 0430 12/30/20 0500 01/02/21 0617  WBC 6.9 7.6 8.0 8.2  HGB 11.3* 10.5* 11.1* 13.5  HCT 35.4* 32.9* 34.2* 40.9  MCV 99.7 99.7 98.8 96.2  PLT 182 165 208 250   Basic Metabolic Panel: Recent Labs  Lab 12/29/20 0430 12/30/20 0500 12/31/20 1154 01/01/21 0506 01/02/21 0617  NA 139 142 141 143 143  K 4.8 3.9 5.0 3.7 3.1*  CL 110 106 107 108 106  CO2 _0 GLUCOSE 209* 197* 155* 129* 170*  BUN 35* 36* 31* 24* 17  CREATININE 1.36* 1.29* 0.89 1.07 1.18  CALCIUM 8.9 9.1 9.2 9.0 9.3  MG 2.1 1.9 2.0 2.2 2.0  PHOS 3.5 3.6 3.4 3.7 3.4   GFR: Estimated Creatinine Clearance: 78.8 mL/min (by C-G formula based on SCr of 1.18 mg/dL). Liver Function Tests: No results for input(s): AST, ALT, ALKPHOS, BILITOT, PROT, ALBUMIN in the last 168 hours. No results for input(s): LIPASE, AMYLASE in the last 168 hours. No results for input(s): AMMONIA in the last 168 hours. Coagulation Profile: No results for input(s): INR, PROTIME in the last 168 hours. Cardiac Enzymes: No results for input(s): CKTOTAL, CKMB, CKMBINDEX, TROPONINI in the last 168 hours.  BNP (last 3 results) No results for input(s): PROBNP in the last 8760 hours. HbA1C: No results for input(s): HGBA1C in the last 72 hours. CBG: Recent Labs  Lab 01/01/21 2305 01/02/21 0726 01/02/21 1625 01/02/21 2125 01/03/21 0906  GLUCAP 197* 188* 164* 144* 154*   Lipid Profile: No results for input(s): CHOL, HDL, LDLCALC, TRIG, CHOLHDL, LDLDIRECT in the last 72 hours. Thyroid Function Tests: No results for input(s): TSH, T4TOTAL, FREET4, T3FREE, THYROIDAB in  the last 72 hours. Anemia Panel: No results for input(s): VITAMINB12, FOLATE, FERRITIN, TIBC, IRON, RETICCTPCT in the last 72 hours. Urine analysis:    Component Value Date/Time   COLORURINE YELLOW (A) 12/26/2020 2218   APPEARANCEUR CLOUDY (A) 12/26/2020 2218   LABSPEC 1.017 12/26/2020 2218   PHURINE 5.0 12/26/2020 2218   GLUCOSEU NEGATIVE 12/26/2020 2218   HGBUR NEGATIVE 12/26/2020 2218   BILIRUBINUR NEGATIVE 12/26/2020 [place] 12/26/2020 2218   PROTEINUR NEGATIVE 12/26/2020 2218   NITRITE NEGATIVE 12/26/2020 2218   LEUKOCYTESUR TRACE (A) 12/26/2020 2218   Sepsis Labs: _1 (procalcitonin:4,lacticidven:4)  ) Recent Results (from the past 240 hour(s))  MRSA Next Gen by PCR, Nasal     Status: None   Collection Time: 12/24/20 11:02 PM   Specimen: Nasal Mucosa; Nasal Swab  Result Value Ref Range Status   MRSA by PCR Next Gen NOT DETECTED NOT DETECTED Final    Comment: (NOTE)  The GeneXpert MRSA Assay (FDA approved for NASAL specimens only), is one component of a comprehensive MRSA colonization surveillance program. It is not intended to diagnose MRSA infection nor to guide or monitor treatment for MRSA infections. Test performance is not FDA approved in patients less than 69 years old. Performed at [facility], [address] [place], [place] [address]   Culture, Respiratory w Gram Stain     Status: None   Collection Time: 12/26/20 11:45 AM   Specimen: Tracheal Aspirate; Respiratory  Result Value Ref Range Status   Specimen Description   Final    TRACHEAL ASPIRATE Performed at [facility], [address] [place], [place] [address]    Special Requests   Final    NONE Performed at [facility], [place], [place] [address]    Gram Stain   Final    ABUNDANT WBC PRESENT,BOTH PMN AND MONONUCLEAR RARE GRAM POSITIVE COCCI    Culture   Final    RARE Normal respiratory flora-no Staph aureus or Pseudomonas  seen Performed at [facility], [place] [address], [place], [place] [address]    Report Status 12/28/2020 FINAL  Final  Culture, Respiratory w Gram Stain     Status: None   Collection Time: 12/28/20  3:56 PM   Specimen: SPU; Respiratory  Result Value Ref Range Status   Specimen Description   Final    SPUTUM Performed at [facility], [address] [place], [place] [address]    Special Requests   Final    NONE Performed at [facility], [address] [place], [place] [address]    Gram Stain   Final    RARE WBC PRESENT, PREDOMINANTLY PMN RARE GRAM POSITIVE COCCI IN PAIRS IN CLUSTERS RARE GRAM NEGATIVE RODS    Culture   Final    MODERATE KLEBSIELLA PNEUMONIAE NO STAPHYLOCOCCUS AUREUS ISOLATED No Pseudomonas species isolated Performed at [facility] [address], [place],  [address]    Report Status 12/31/2020 FINAL  Final   Organism ID, Bacteria KLEBSIELLA PNEUMONIAE  Final      Susceptibility   Klebsiella pneumoniae - MIC*    AMPICILLIN >=32 RESISTANT Resistant     CEFAZOLIN <=4 SENSITIVE Sensitive     CEFEPIME <=0.12 SENSITIVE Sensitive     CEFTAZIDIME <=1 SENSITIVE Sensitive     CEFTRIAXONE <=0.25 SENSITIVE Sensitive     CIPROFLOXACIN <=0.25 SENSITIVE Sensitive     GENTAMICIN <=1 SENSITIVE Sensitive     IMIPENEM <=0.25 SENSITIVE Sensitive     TRIMETH/SULFA <=20 SENSITIVE Sensitive     AMPICILLIN/SULBACTAM 4 SENSITIVE Sensitive     PIP/TAZO <=4 SENSITIVE Sensitive     * MODERATE KLEBSIELLA PNEUMONIAE         Radiology Studies: MR BRAIN WO CONTRAST  Result Date: 01/02/2021 CLINICAL DATA:  Neuro deficit, acute, stroke suspected asymetric weakness. Additional provided: Confusion and delirium. EXAM: MRI HEAD WITHOUT CONTRAST TECHNIQUE: Multiplanar, multiecho pulse sequences of the brain and surrounding structures were obtained without intravenous contrast. COMPARISON:  Brain MRI 12/27/2020. FINDINGS: Brain: Mild intermittent  motion degradation. Mild cerebral and cerebellar atrophy. Redemonstrated chronic lacunar infarcts within the bilateral cerebral hemispheric white matter, right caudate nucleus and anterior limb of right internal capsule. Background moderate/advanced patchy and confluent T2/FLAIR hyperintensity within the cerebral white matter, nonspecific but compatible with chronic small vessel ischemic disease. Redemonstrated small chronic infarcts within the bilateral cerebellar hemispheres. A chronic microhemorrhage within the left frontal lobe  white matter was better appreciated on the prior examination (at which time SWI imaging was performed). There is no acute infarct. No evidence of an intracranial mass. No extra-axial fluid collection. No midline shift. Vascular: Expected proximal arterial flow voids. Skull and upper cervical spine: No focal marrow lesion. Sinuses/Orbits: Visualized orbits show no acute finding. Trace bilateral maxillary sinus mucosal thickening. Other: Small left mastoid effusion. IMPRESSION: Mildly motion degraded exam. No evidence of acute intracranial abnormality. Redemonstrated chronic lacunar infarcts within the bilateral cerebral hemispheric white matter, right basal ganglia and anterior limb of right internal capsule. Stable background moderate/advanced chronic small vessel ischemic changes within the cerebral white matter. Redemonstrated small chronic infarcts within the bilateral cerebellar hemispheres. Small left mastoid effusion. Electronically Signed   By: Kellie Simmering DO   On: 01/02/2021 11:32        Scheduled Meds:  vitamin C  250 mg Oral BID   chlorhexidine gluconate (MEDLINE KIT)  15 mL Mouth Rinse BID   Chlorhexidine Gluconate Cloth  6 each Topical Daily   feeding supplement (NEPRO CARB STEADY)  237 mL Oral TID BM   folic acid  1 mg Oral Daily   furosemide  40 mg Oral BID   insulin aspart  0-20 Units Subcutaneous TID WC   insulin aspart  0-5 Units Subcutaneous QHS   insulin  glargine  10 Units Subcutaneous QHS   metoprolol succinate  200 mg Oral Daily   multivitamin with minerals  1 tablet Per Tube Daily   nicotine  21 mg Transdermal Daily   pantoprazole  40 mg Oral Daily   Rivaroxaban  15 mg Oral BID WC   Followed by   Derrill Memo ON 01/23/2021] rivaroxaban  20 mg Oral Q supper   sodium chloride flush  10-40 mL Intracatheter Q12H   thiamine  100 mg Oral Daily   Or   thiamine  100 mg Intravenous Daily   Continuous Infusions:   ceFAZolin (ANCEF) IV 1 g (01/03/21 0521)     LOS: 12 days    Time spent: 30 min    Desma Maxim, MD [facility]   If 7PM-7AM, please contact night-coverage [email or web address] Password [facility] 01/03/2021, 12:14 PM

## 2021-01-03 NOTE — Progress Notes (Signed)
Patient is becoming restless and agitated and is threatening to go home. Has been in and out of bed several times and is not listening to commands. Pulled out his Tele and do not want to put it back again. He also refused to get his blood taken for blood work.

## 2021-01-03 NOTE — Progress Notes (Signed)
Speech Language Pathology Evaluation Patient Details Name: Jeremy [name] MRN: [id number] DOB: 1956-05-17 Today's Date: 01/03/2021 Time: 8469-6295 SLP Time Calculation (min) (ACUTE ONLY): 28 min  Problem List:  Patient Active Problem List   Diagnosis Date Noted   On mechanically assisted ventilation (HCC) 12/28/2020   Atrial fibrillation with RVR (HCC) 12/28/2020   Acute delirium 12/28/2020   Tobacco use 12/28/2020   Diabetes mellitus, type 2 (HCC) 12/28/2020   NSTEMI (non-ST elevated myocardial infarction) (HCC)    Acute respiratory failure with hypoxia (HCC)    Bilateral pulmonary embolism (HCC) 12/22/2020   History of cocaine use 11/26/2019   History of marijuana use 11/26/2019   History of illicit drug use 11/26/2019   Pharmacologic therapy 11/26/2019   Acute pain (now resolved) 11/26/2019   MRSA (methicillin resistant Staphylococcus aureus) infection 10/18/2019   Hyperosmolar non-ketotic state due to type 2 diabetes mellitus (HCC) 08/21/2019   Hyponatremia 08/19/2019   AKI (acute kidney injury) (HCC) 08/19/2019   Essential hypertension 08/19/2019   Medical non-compliance 08/19/2019   Post-traumatic osteoarthritis of right ankle 10/23/2018   Skin ulcer of right ankle with fat layer exposed (HCC) 10/23/2018   Status post ORIF of fracture of ankle 10/23/2018   Diabetic foot infection (HCC) 10/22/2018   Pressure injury of skin 02/11/2018   Past Medical History:  Past Medical History:  Diagnosis Date   Coronary artery disease    Diabetes mellitus without complication (HCC)    Hypertension    S/P CABG x 1    Past Surgical History:  Past Surgical History:  Procedure Laterality Date   ANKLE ARTHROSCOPY W/ OPEN REPAIR Right    CORONARY ARTERY BYPASS GRAFT     PULMONARY THROMBECTOMY Bilateral 12/24/2020   Procedure: PULMONARY THROMBECTOMY;  Surgeon: Annice Needy, MD;  Location: [facility] INVASIVE CV LAB;  Service: Cardiovascular;  Laterality: Bilateral;   HPI:  Hx CAD s/p  cabg, htn, dm, polysubstance abuse (cocaine, MJ), presenting 7/11 w/ sob and palpitations, found to have PE, for worsening agigation evaluated by vascular who proceeded w/ thrombectomy on 7/13, admitted to ICU for worsening agigation and inability to protect airway, intubated on 7/14 and started on precedex, extubated 7/19. Per RN tolerating dysphagia 3/thin liquid diet. SLP consulted for cognitive-linguistic evaluation. MRI 01/02/21 showed mild cerebral and cerebellar atrophy, chronic lacunar infarcts within the bilateral, cerebral hemispheric white matter, right basal ganglia and anterior limb of right internal capsule.   Assessment / Plan / Recommendation Clinical Impression   Patient presents with significant cognitive-communication impairment characterized by reduced intellectual and safety awareness, impaired judgement/insight, decreased sustained attention, impaired short-term recall and simple problem solving. Patient scored 12/30 on the SLUMS assessment, consistent with severe impairments. Pt's wife reports concerns that pt's thinking and judgement are altered from his baseline. Patient reports he is tired of "sitting around" and wants to be engaged in activities to help him rehabilitate. Intelligibility is judged to be ~90% for this trained listener; given prior infarcts noted on MRI this may be baseline function. Agree he would benefit from CIR to maximize cognitive-linguistic function and facilitate return to premorbid activities. Will follow acutely.    SLP Assessment  SLP Recommendation/Assessment: Patient needs continued Speech Lanaguage Pathology Services SLP Visit Diagnosis: Cognitive communication deficit (R41.841)    Follow Up Recommendations  Inpatient Rehab    Frequency and Duration min 2x/week  2 weeks      SLP Evaluation Cognition  Overall Cognitive Status: Impaired/Different from baseline Arousal/Alertness: Awake/alert Orientation Level: Oriented to person;Oriented to  place;Oriented to time Attention: Sustained Sustained Attention: Impaired Sustained Attention Impairment: Verbal basic;Functional basic (lost focus during animal naming task, story for retell) Memory: Impaired Memory Impairment: Other (comment) (delayed recall 2/5 words) Awareness: Impaired Awareness Impairment: Intellectual impairment (denies deficits. wife reports pt mentation drastically different from prior to admit) Problem Solving: Impaired Problem Solving Impairment: Verbal basic (basic $ calculations impaired) Executive Function: Reasoning;Decision Making;Organizing Reasoning: Impaired Reasoning Impairment: Verbal basic;Functional basic Organizing: Impaired Organizing Impairment: Functional basic (clock drawing severely impaired) Behaviors: Restless;Impulsive;Other (comment) (verbal agitation reported) Safety/Judgment: Impaired       Comprehension  Auditory Comprehension Overall Auditory Comprehension: Appears within functional limits for tasks assessed Yes/No Questions: Within Functional Limits Commands: Within Functional Limits Conversation: Simple Interfering Components: Attention EffectiveTechniques: Repetition Visual Recognition/Discrimination Discrimination: Not tested Reading Comprehension Reading Status: Not tested    Expression Expression Primary Mode of Expression: Verbal Verbal Expression Overall Verbal Expression: Appears within functional limits for tasks assessed Written Expression Dominant Hand: Right Written Expression: Not tested   Oral / Motor  Motor Speech Overall Motor Speech: Impaired Level of Impairment: Conversation Intelligibility: Intelligibility reduced Conversation:  (90%)   GO                   Rondel Baton, MS, CCC-SLP Speech-Language Pathologist  Arlana Lindau 01/03/2021, 12:38 PM

## 2021-01-03 NOTE — Progress Notes (Addendum)
Inpatient Rehab Admissions:  Inpatient Rehab Consult received.  I spoke with patient and wife, Aram Beecham on the telephone for rehabilitation assessment and to discuss goals and expectations of an inpatient rehab admission.  Both acknowledged understanding. Both interested in pt pursuing CIR.  Will continue to follow.   Addendum:  Pt's significant other called me to inform me that pt has changed his mind and would prefer to receive therapy at home through a [facility] agency. TOC made aware.   Signed: Wolfgang Phoenix, MS, CCC-SLP Admissions Coordinator [phone]

## 2021-01-03 NOTE — Clinical Social Work Note (Signed)
[facility] REFERRAL        Occupational Therapy * Physical Therapy * Speech Therapy          DATE __12/25/22___________________________ PATIENT NAME__[name]_____________________________________ PATIENT MRN___[id number]_________________  DIAGNOSIS/DIAGNOSIS CODE _Acute Kidney Injury N17.9______________ DATE OF DISCHARGE: ______________  PRIMARY CARE PHYSICIAN ________________________________     PCP PHONE/FAX___________________________    Dear Provider (Name: _______________________________   Fax: ___________________________):   I certify that I have examined this patient and that occupational/physical/speech therapy is necessary on an outpatient basis.    The patient has expressed interest in completing their recommended course of therapy at your location.  Once a formal order from the patient's primary care physician has been obtained, please contact him/her to schedule an appointment for evaluation at your earliest convenience.   [ x ]  Physical Therapy Evaluate and Treat          [ x ]  Occupational Therapy Evaluate and Treat                                  [  ]  Speech Therapy Evaluate and Treat     The patient's primary care physician (listed above) must furnish and be responsible for a formal order such that the recommended services may be furnished while under the primary physician's care, and that the plan of care will be established and reviewed every 30 days (or more often if condition necessitates).

## 2021-01-03 NOTE — Progress Notes (Signed)
PT Cancellation Note  Patient Details Name: Jeremy [name] MRN: [id number] DOB: [birth year]   Cancelled Treatment:     PT attempt. Pt currently working with OT. Will return at later time and continue to follow per current POC.    Rushie Chestnut 01/03/2021, 1:58 PM

## 2021-01-06 ENCOUNTER — Emergency Department
Admission: EM | Admit: 2021-01-06 | Discharge: 2021-01-06 | Disposition: A | Discharge status: Home / Self Care | Payer: Medicaid Other | Attending: Emergency Medicine | Admitting: Emergency Medicine

## 2021-01-06 ENCOUNTER — Emergency Department: Payer: Medicaid Other

## 2021-01-06 ENCOUNTER — Encounter: Payer: Self-pay | Admitting: Intensive Care

## 2021-01-06 ENCOUNTER — Other Ambulatory Visit: Payer: Self-pay

## 2021-01-06 DIAGNOSIS — Z79899 Other long term (current) drug therapy: Secondary | ICD-10-CM | POA: Diagnosis not present

## 2021-01-06 DIAGNOSIS — E119 Type 2 diabetes mellitus without complications: Secondary | ICD-10-CM | POA: Insufficient documentation

## 2021-01-06 DIAGNOSIS — R0602 Shortness of breath: Secondary | ICD-10-CM | POA: Diagnosis present

## 2021-01-06 DIAGNOSIS — I4891 Unspecified atrial fibrillation: Secondary | ICD-10-CM | POA: Diagnosis not present

## 2021-01-06 DIAGNOSIS — Z20822 Contact with and (suspected) exposure to covid-19: Secondary | ICD-10-CM | POA: Insufficient documentation

## 2021-01-06 DIAGNOSIS — R002 Palpitations: Secondary | ICD-10-CM

## 2021-01-06 DIAGNOSIS — Z7984 Long term (current) use of oral hypoglycemic drugs: Secondary | ICD-10-CM | POA: Insufficient documentation

## 2021-01-06 DIAGNOSIS — F17210 Nicotine dependence, cigarettes, uncomplicated: Secondary | ICD-10-CM | POA: Diagnosis not present

## 2021-01-06 DIAGNOSIS — Z951 Presence of aortocoronary bypass graft: Secondary | ICD-10-CM | POA: Diagnosis not present

## 2021-01-06 DIAGNOSIS — I10 Essential (primary) hypertension: Secondary | ICD-10-CM | POA: Insufficient documentation

## 2021-01-06 DIAGNOSIS — I2510 Atherosclerotic heart disease of native coronary artery without angina pectoris: Secondary | ICD-10-CM | POA: Insufficient documentation

## 2021-01-06 DIAGNOSIS — Z7901 Long term (current) use of anticoagulants: Secondary | ICD-10-CM | POA: Diagnosis not present

## 2021-01-06 LAB — CBC
HCT: 39.8 % (ref 39.0–52.0)
Hemoglobin: 13.3 g/dL (ref 13.0–17.0)
MCH: 31.5 pg (ref 26.0–34.0)
MCHC: 33.4 g/dL (ref 30.0–36.0)
MCV: 94.3 fL (ref 80.0–100.0)
Platelets: 409 10*3/uL — ABNORMAL HIGH (ref 150–400)
RBC: 4.22 MIL/uL (ref 4.22–5.81)
RDW: 13.4 % (ref 11.5–15.5)
WBC: 7 10*3/uL (ref 4.0–10.5)
nRBC: 0 % (ref 0.0–0.2)

## 2021-01-06 LAB — BASIC METABOLIC PANEL
Anion gap: 10 (ref 5–15)
BUN: 16 mg/dL (ref 8–23)
CO2: 18 mmol/L — ABNORMAL LOW (ref 22–32)
Calcium: 9.2 mg/dL (ref 8.9–10.3)
Chloride: 110 mmol/L (ref 98–111)
Creatinine, Ser: 1.07 mg/dL (ref 0.61–1.24)
GFR, Estimated: >60 mL/min (ref >60)
Glucose, Bld: 179 mg/dL — ABNORMAL HIGH (ref 70–99)
Potassium: 4.7 mmol/L (ref 3.5–5.1)
Sodium: 138 mmol/L (ref 135–145)

## 2021-01-06 LAB — TROPONIN I (HIGH SENSITIVITY): Troponin I (High Sensitivity): 22 ng/L — ABNORMAL HIGH (ref <18)

## 2021-01-06 LAB — RESP PANEL BY RT-PCR (FLU A&B, COVID) ARPGX2
Influenza A by PCR: NEGATIVE
Influenza B by PCR: NEGATIVE
SARS Coronavirus 2 by RT PCR: NEGATIVE

## 2021-01-06 LAB — BRAIN NATRIURETIC PEPTIDE: B Natriuretic Peptide: 426.7 pg/mL — ABNORMAL HIGH (ref 0.0–100.0)

## 2021-01-06 MED ORDER — METOPROLOL TARTRATE 5 MG/5ML IV SOLN: 5.0000 mg | Freq: Once | INTRAVENOUS | Status: DC

## 2021-01-06 MED ORDER — METOPROLOL TARTRATE 25 MG PO TABS
25.0000 mg | ORAL_TABLET | Freq: Once | ORAL | Status: AC
  Administered 2021-01-06: 25 mg via ORAL
  Filled 2021-01-06: qty 1

## 2021-01-06 NOTE — ED Notes (Signed)
This RN to bedside. Patient refusing IV medication, stating, "I need to go, I'm not staying here." Dr. Lenard Lance notified.

## 2021-01-06 NOTE — ED Notes (Signed)
Patient called out to nurse's station requesting to be discharged. This RN to bedside. Patient assisted to commode in room and back to bed. Patient reports he is ready to leave. Dr. Lenard Lance notified.

## 2021-01-06 NOTE — ED Triage Notes (Signed)
Patient c/o sob and palpitations X2 days. Was recently admitted to hospital. Patient requesting O2 for sob. Oxygen placed on patient for RR and comfort

## 2021-01-06 NOTE — ED Notes (Signed)
Patient encouraged to stay for repeat troponin ordered. Patient declined.

## 2021-01-06 NOTE — ED Notes (Signed)
Patient AMA form signed by patient, witnessed by Bennetta Laos.

## 2021-01-06 NOTE — ED Notes (Signed)
Patient reports wanting to leave AMA at this time. Jeannett Senior, charge notified, and Dr. Lenard Lance aware.

## 2021-01-06 NOTE — ED Provider Notes (Signed)
[facility] Emergency Department Provider Note  Time seen: 6:45 PM  I have reviewed the triage vital signs and the nursing notes.   HISTORY  Chief Complaint Shortness of Breath and Palpitations   HPI Rogerio Boutelle is a 65 y.o. male with a past medical history of CAD, diabetes, hypertension, recently diagnosed atrial fibrillation who presents to the emergency department for shortness of breath and palpitations.  According to the patient for the past 2 days he has been feeling shortness of breath and palpitations.  Per record review patient was discharged 01/03/2021 after a prolonged stay including ICU stay, during that admission patient was diagnosed with atrial fibrillation started on metoprolol and Xarelto.   Past Medical History:  Diagnosis Date   Coronary artery disease    Diabetes mellitus without complication ([place])    Hypertension    S/P CABG x 1     Patient Active Problem List   Diagnosis Date Noted   On mechanically assisted ventilation ([place]) 12/28/2020   Atrial fibrillation with RVR ([place]) 12/28/2020   Acute delirium 12/28/2020   Tobacco use 12/28/2020   Diabetes mellitus, type 2 ([place]) 12/28/2020   NSTEMI (non-ST elevated myocardial infarction) ([name])    Acute respiratory failure with hypoxia (HCC)    Bilateral pulmonary embolism ([place]) 12/22/2020   History of cocaine use 11/26/2019   History of marijuana use 03/54/6568   History of illicit drug use 12/75/1700   Pharmacologic therapy 11/26/2019   Acute pain (now resolved) 11/26/2019   MRSA (methicillin resistant Staphylococcus aureus) infection 10/18/2019   Hyperosmolar non-ketotic state due to type 2 diabetes mellitus ([place]) 08/21/2019   Hyponatremia 08/19/2019   AKI (acute kidney injury) ([place]) 08/19/2019   Essential hypertension 08/19/2019   Medical non-compliance 08/19/2019   Post-traumatic osteoarthritis of right ankle 10/23/2018   Skin ulcer of right ankle with fat layer exposed ([place])  10/23/2018   Status post ORIF of fracture of ankle 10/23/2018   Diabetic foot infection ([place]) 10/22/2018   Pressure injury of skin 02/11/2018    Past Surgical History:  Procedure Laterality Date   ANKLE ARTHROSCOPY W/ OPEN REPAIR Right    CORONARY ARTERY BYPASS GRAFT     PULMONARY THROMBECTOMY Bilateral 12/24/2020   Procedure: PULMONARY THROMBECTOMY;  Surgeon: Algernon Huxley, MD;  Location: [place] CV LAB;  Service: Cardiovascular;  Laterality: Bilateral;    Prior to Admission medications   Medication Sig Start Date End Date Taking? Authorizing Provider  blood glucose meter kit and supplies KIT Dispense based on patient and insurance preference. Use up to four times daily as directed. (FOR ICD-9 250.00, 250.01). 08/21/19   Dhungel, Nishant, MD  furosemide (LASIX) 20 MG tablet Take 1 tablet (20 mg total) by mouth daily. 01/03/21 01/03/22  Wouk, Ailene Rud, MD  hydrOXYzine (ATARAX/VISTARIL) 25 MG tablet Take 25 mg by mouth 3 (three) times daily as needed for itching.    [provider]  losartan (COZAAR) 50 MG tablet Take 1 tablet (50 mg total) by mouth daily. 01/03/21   Wouk, Ailene Rud, MD  metFORMIN (GLUCOPHAGE) 1000 MG tablet Take 1 tablet (1,000 mg total) by mouth daily with breakfast. 01/03/21   Wouk, Ailene Rud, MD  metoprolol (TOPROL-XL) 200 MG 24 hr tablet Take 1 tablet (200 mg total) by mouth daily. Take with or immediately following a meal. 01/04/21   Wouk, Ailene Rud, MD  Rivaroxaban (XARELTO) 15 MG TABS tablet Take 1 tablet (15 mg total) by mouth 2 (two) times daily with a meal  for 20 days. 01/03/21 01/23/21  Wouk, Ailene Rud, MD  rivaroxaban (XARELTO) 20 MG TABS tablet Take 1 tablet (20 mg total) by mouth daily with supper. 01/23/21   Wouk, Ailene Rud, MD    Allergies  Allergen Reactions   Amoxicillin Swelling   Keflex [Cephalexin] Rash    Family History  Problem Relation Age of Onset   Heart attack Father     Social History Social History   Tobacco  Use   Smoking status: Some Days    Packs/day: 0.50    Types: Cigarettes   Smokeless tobacco: Former  Scientific laboratory technician Use: Never used  Substance Use Topics   Alcohol use: Yes    Alcohol/week: 3.0 standard drinks    Types: 3 Glasses of wine per week   Drug use: Not Currently    Types: Marijuana    Comment: last use May 2022    Review of Systems Constitutional: Negative for fever. Cardiovascular: Negative for chest pain. Respiratory: Positive for shortness of breath worse over the past 2 days.  Positive for palpitations. Gastrointestinal: Negative for abdominal pain, vomiting and diarrhea. Musculoskeletal: Negative for musculoskeletal complaints Neurological: Negative for headache All other ROS negative  ____________________________________________   PHYSICAL EXAM:  VITAL SIGNS: ED Triage Vitals  Enc Vitals Group     BP 01/06/21 1741 119/83     Pulse Rate 01/06/21 1741 (!) 116     Resp 01/06/21 1741 (!) 22     Temp 01/06/21 1741 (!) 97.5 F (36.4 C)     Temp Source 01/06/21 1741 Oral     SpO2 01/06/21 1741 95 %     Weight 01/06/21 1742 260 lb (117.9 kg)     Height 01/06/21 1742 _0  (1.803 m)     Head Circumference --      Peak Flow --      Pain Score 01/06/21 1742 4     Pain Loc --      Pain Edu? --      Excl. in [place]? --    Constitutional: Alert and oriented. Well appearing and in no distress. Eyes: Normal exam ENT      Head: Normocephalic and atraumatic.      Mouth/Throat: Mucous membranes are moist. Cardiovascular: Irregular rhythm consistent with atrial fibrillation around 100 to 120 bpm. Respiratory: Normal respiratory effort without tachypnea nor retractions. Breath sounds are clear Gastrointestinal: Soft and nontender. No distention.   Musculoskeletal: Nontender with normal range of motion in all extremities. Neurologic:  Normal speech and language. No gross focal neurologic deficits  Skin:  Skin is warm, dry and intact.  Psychiatric: Mood and  affect are normal.   ____________________________________________    EKG  EKG viewed and interpreted by myself shows atrial flutter at 86 bpm with variable AV block.  Narrow QRS, normal axis, nonspecific ST changes.  ____________________________________________    RADIOLOGY  Chest x-ray shows mild pulmonary edema.  ____________________________________________   INITIAL IMPRESSION / ASSESSMENT AND PLAN / ED COURSE  Pertinent labs & imaging results that were available during my care of the patient were reviewed by me and considered in my medical decision making (see chart for details).   Patient presents emergency department for worsening shortness of breath and palpitations.  Per record review patient recently discharged from the hospital diagnosed with new onset atrial fibrillation started on metoprolol and anticoagulation.  We will dose IV metoprolol in the emergency department given the patient's elevated heart rate around 110 to 120 bpm  currently.  We will check labs including cardiac enzymes and continue to closely monitor.  Shortly after my evaluation with the patient he became somewhat agitated and requested to the nurse that he be discharged.  Patient lab work had not yet returned however patient still wished to leave, he signed out AMA for unknown reasons.  Patient's lab work has since resulted showing no significant abnormalities slightly elevated troponin however down from historical values.  Calem Cocozza was evaluated in Emergency Department on 01/06/2021 for the symptoms described in the history of present illness. He was evaluated in the context of the global COVID-19 pandemic, which necessitated consideration that the patient might be at risk for infection with the SARS-CoV-2 virus that causes COVID-19. Institutional protocols and algorithms that pertain to the evaluation of patients at risk for COVID-19 are in a state of rapid change based on information released by  regulatory bodies including the CDC and federal and state organizations. These policies and algorithms were followed during the patient's care in the ED.  ____________________________________________   FINAL CLINICAL IMPRESSION(S) / ED DIAGNOSES  Dyspnea Atrial fibrillation   Harvest Dark, MD 01/06/21 2213

## 2021-02-03 ENCOUNTER — Ambulatory Visit (INDEPENDENT_AMBULATORY_CARE_PROVIDER_SITE_OTHER): Payer: Medicaid Other | Admitting: Vascular Surgery

## 2021-02-03 ENCOUNTER — Other Ambulatory Visit: Payer: Self-pay

## 2021-02-03 VITALS — BP 116/83 | HR 77 | Ht 71.0 in | Wt 258.0 lb

## 2021-02-03 DIAGNOSIS — E119 Type 2 diabetes mellitus without complications: Secondary | ICD-10-CM | POA: Diagnosis not present

## 2021-02-03 DIAGNOSIS — I10 Essential (primary) hypertension: Secondary | ICD-10-CM | POA: Diagnosis not present

## 2021-02-03 DIAGNOSIS — I2699 Other pulmonary embolism without acute cor pulmonale: Secondary | ICD-10-CM | POA: Diagnosis not present

## 2021-02-03 NOTE — Assessment & Plan Note (Signed)
blood glucose control important in reducing the progression of atherosclerotic disease. Also, involved in wound healing. On appropriate medications.  

## 2021-02-03 NOTE — Assessment & Plan Note (Signed)
blood pressure control important in reducing the progression of atherosclerotic disease. On appropriate oral medications.  

## 2021-02-03 NOTE — Assessment & Plan Note (Signed)
Status post pulmonary thrombectomy with good results.  6 months of anticoagulation.  We can see him back in 5 to 6 months to discuss whether or not to continue anticoagulation or switch to aspirin at that time.

## 2021-02-03 NOTE — Progress Notes (Signed)
MRN : [id number]  Jeremy [name] is a 65 y.o. (Apr 22, 1956) male who presents with chief complaint of  Chief Complaint  Patient presents with   Follow-up    1 Mo [facility] Pulmonary Thrombectomy no studies  .  History of Present Illness: Patient returns today in follow up of his pulmonary embolus.  He is a little over a month after pulmonary thrombectomy for large volume pulmonary embolus bilaterally.  His hospital course was complicated by significant mental status changes and withdrawal.  His mental status is not graded at baseline and he and his girlfriend are both poor historians.  Once he was discharged from the hospital, he is done quite well.  He denies any significant shortness of breath or chest pain.  He does have some mild right leg swelling but this is not that bothersome.  Tolerating anticoagulation without any signs of bleeding.  Current Outpatient Medications  Medication Sig Dispense Refill   blood glucose meter kit and supplies KIT Dispense based on patient and insurance preference. Use up to four times daily as directed. (FOR ICD-9 250.00, 250.01). 1 each 0   ENTRESTO 49-51 MG Take 1 tablet by mouth 2 (two) times daily.     furosemide (LASIX) 20 MG tablet Take 1 tablet (20 mg total) by mouth daily. 30 tablet 11   hydrOXYzine (ATARAX/VISTARIL) 25 MG tablet Take 25 mg by mouth 3 (three) times daily as needed for itching.     losartan (COZAAR) 50 MG tablet Take 1 tablet (50 mg total) by mouth daily. 30 tablet 1   metFORMIN (GLUCOPHAGE) 1000 MG tablet Take 1 tablet (1,000 mg total) by mouth daily with breakfast. 60 tablet 1   metoprolol (TOPROL-XL) 200 MG 24 hr tablet Take 1 tablet (200 mg total) by mouth daily. Take with or immediately following a meal. 30 tablet 1   rivaroxaban (XARELTO) 20 MG TABS tablet Take 1 tablet (20 mg total) by mouth daily with supper. 30 tablet 0   sacubitril-valsartan (ENTRESTO) 49-51 MG Take 1 tablet by mouth 2 (two) times daily.     spironolactone  (ALDACTONE) 25 MG tablet Take 25 mg by mouth daily.     tadalafil (CIALIS) 5 MG tablet Take 5 mg by mouth daily.     Torsemide 40 MG TABS Take by mouth.     Rivaroxaban (XARELTO) 15 MG TABS tablet Take 1 tablet (15 mg total) by mouth 2 (two) times daily with a meal for 20 days. 40 tablet 0   No current facility-administered medications for this visit.    Past Medical History:  Diagnosis Date   Coronary artery disease    Diabetes mellitus without complication ([name])    Hypertension    S/P CABG x 1     Past Surgical History:  Procedure Laterality Date   ANKLE ARTHROSCOPY W/ OPEN REPAIR Right    CORONARY ARTERY BYPASS GRAFT     PULMONARY THROMBECTOMY Bilateral 12/24/2020   Procedure: PULMONARY THROMBECTOMY;  Surgeon: Algernon Huxley, MD;  Location: [facility];  Service: Cardiovascular;  Laterality: Bilateral;     Social History   Tobacco Use   Smoking status: Some Days    Packs/day: 0.50    Types: Cigarettes   Smokeless tobacco: Former  Scientific laboratory technician Use: Never used  Substance Use Topics   Alcohol use: Yes    Alcohol/week: 3.0 standard drinks    Types: 3 Glasses of wine per week   Drug use: Not Currently  Types: Marijuana    Comment: last use May 2022       Family History  Problem Relation Age of Onset   Heart attack Father      Allergies  Allergen Reactions   Amoxicillin Swelling   Keflex [Cephalexin] Rash     REVIEW OF SYSTEMS (Negative unless checked)  Constitutional: []Weight loss  []Fever  []Chills Cardiac: []Chest pain   []Chest pressure   []Palpitations   []Shortness of breath when laying flat   []Shortness of breath at rest   []Shortness of breath with exertion. Vascular:  []Pain in legs with walking   []Pain in legs at rest   []Pain in legs when laying flat   []Claudication   []Pain in feet when walking  []Pain in feet at rest  []Pain in feet when laying flat   [x]History of DVT   [x]Phlebitis   []Swelling in legs   []Varicose veins    []Non-healing ulcers Pulmonary:   []Uses home oxygen   []Productive cough   []Hemoptysis   []Wheeze  []COPD   []Asthma Neurologic:  []Dizziness  []Blackouts   []Seizures   []History of stroke   []History of TIA  []Aphasia   []Temporary blindness   []Dysphagia   []Weakness or numbness in arms   []Weakness or numbness in legs Musculoskeletal:  [x]Arthritis   []Joint swelling   []Joint pain   []Low back pain Hematologic:  []Easy bruising  []Easy bleeding   []Hypercoagulable state   []Anemic   Gastrointestinal:  []Blood in stool   []Vomiting blood  []Gastroesophageal reflux/heartburn   []Abdominal pain Genitourinary:  []Chronic kidney disease   []Difficult urination  []Frequent urination  []Burning with urination   []Hematuria Skin:  []Rashes   []Ulcers   []Wounds Psychological:  []History of anxiety   [] History of major depression.  Physical Examination  BP 116/83   Pulse 77   Ht 5' 11" (1.803 m)   Wt 258 lb (117 kg)   BMI 35.98 kg/m  Gen:  WD/WN, NAD Head: [place]/AT, No temporalis wasting. Ear/Nose/Throat: Hearing grossly intact, nares w/o erythema or drainage Eyes: Conjunctiva clear. Sclera non-icteric Neck: Supple.  Trachea midline Pulmonary:  Good air movement, no use of accessory muscles.  Cardiac: RRR, no JVD Vascular:  Vessel Right Left  Radial Palpable Palpable               Musculoskeletal: M/S 5/5 throughout.  No deformity or atrophy. Mild RLE edema. Neurologic: Sensation grossly intact in extremities.  Symmetrical.  Speech is fluent.  Psychiatric: Judgment intact, Mood & affect appropriate for pt's clinical situation. Dermatologic: No rashes or ulcers noted.  No cellulitis or open wounds.      Labs Recent Results (from the past 2160 hour(s))  Basic metabolic panel     Status: Abnormal   Collection Time: 12/22/20  2:21 PM  Result Value Ref Range   Sodium 139 135 - 145 mmol/L   Potassium 4.1 3.5 - 5.1 mmol/L   Chloride 111 98 - 111 mmol/L   CO2 21 (L) 22 - 32  mmol/L   Glucose, Bld 136 (H) 70 - 99 mg/dL    Comment: Glucose reference range applies only to samples taken after fasting for at least 8 hours.   BUN 13 8 - 23 mg/dL   Creatinine, Ser 1.04 0.61 - 1.24 mg/dL   Calcium 9.2 8.9 - 10.3 mg/dL   GFR, Estimated >60 >60 mL/min    Comment: (NOTE) Calculated using the CKD-EPI Creatinine Equation (  2021)    Anion gap 7 5 - 15    Comment: Performed at [facility], [address] [place], [place] [address]  CBC     Status: None   Collection Time: 12/22/20  2:21 PM  Result Value Ref Range   WBC 6.6 4.0 - 10.5 K/uL   RBC 4.81 4.22 - 5.81 MIL/uL   Hemoglobin 15.4 13.0 - 17.0 g/dL   HCT 44.1 39.0 - 52.0 %   MCV 91.7 80.0 - 100.0 fL   MCH 32.0 26.0 - 34.0 pg   MCHC 34.9 30.0 - 36.0 g/dL   RDW 13.2 11.5 - 15.5 %   Platelets 252 150 - 400 K/uL   nRBC 0.0 0.0 - 0.2 %    Comment: Performed at [facility], [address], [place], [place] [address]  Resp Panel by RT-PCR (Flu A&B, Covid) Nasopharyngeal Swab     Status: None   Collection Time: 12/22/20  2:21 PM   Specimen: Nasopharyngeal Swab; Nasopharyngeal(NP) swabs in vial transport medium  Result Value Ref Range   SARS Coronavirus 2 by RT PCR NEGATIVE NEGATIVE    Comment: (NOTE) SARS-CoV-2 target nucleic acids are NOT DETECTED.  The SARS-CoV-2 RNA is generally detectable in upper respiratory specimens during the acute phase of infection. The lowest concentration of SARS-CoV-2 viral copies this assay can detect is 138 copies/mL. A negative result does not preclude SARS-Cov-2 infection and should not be used as the sole basis for treatment or other patient management decisions. A negative result may occur with  improper specimen collection/handling, submission of specimen other than nasopharyngeal swab, presence of viral mutation(s) within the areas targeted by this assay, and inadequate number of viral copies(<138 copies/mL). A negative result must be combined  with clinical observations, patient history, and epidemiological information. The expected result is Negative.  Fact Sheet for Patients:  EntrepreneurPulse.com.au  Fact Sheet for Healthcare Providers:  IncredibleEmployment.be  This test is no t yet approved or cleared by the [place] FDA and  has been authorized for detection and/or diagnosis of SARS-CoV-2 by FDA under an Emergency Use Authorization (EUA). This EUA will remain  in effect (meaning this test can be used) for the duration of the COVID-19 declaration under Section 564(b)(1) of the Act, 21 U.S.C.section 360bbb-3(b)(1), unless the authorization is terminated  or revoked sooner.       Influenza A by PCR NEGATIVE NEGATIVE   Influenza B by PCR NEGATIVE NEGATIVE    Comment: (NOTE) The Xpert Xpress SARS-CoV-2/FLU/RSV plus assay is intended as an aid in the diagnosis of influenza from Nasopharyngeal swab specimens and should not be used as a sole basis for treatment. Nasal washings and aspirates are unacceptable for Xpert Xpress SARS-CoV-2/FLU/RSV testing.  Fact Sheet for Patients: EntrepreneurPulse.com.au  Fact Sheet for Healthcare Providers: IncredibleEmployment.be  This test is not yet approved or cleared by the [place] FDA and has been authorized for detection and/or diagnosis of SARS-CoV-2 by FDA under an Emergency Use Authorization (EUA). This EUA will remain in effect (meaning this test can be used) for the duration of the COVID-19 declaration under Section 564(b)(1) of the Act, 21 U.S.C. section 360bbb-3(b)(1), unless the authorization is terminated or revoked.  Performed at [facility], [address] [place], [place] [address]   Troponin I (High Sensitivity)     Status: Abnormal   Collection Time: 12/22/20  2:21 PM  Result Value Ref Range   Troponin I (High Sensitivity) 74 (H) <18 ng/L  Comment:  (NOTE) Elevated high sensitivity troponin I (hsTnI) values and significant  changes across serial measurements may suggest ACS but many other  chronic and acute conditions are known to elevate hsTnI results.  Refer to the "Links" section for chest pain algorithms and additional  guidance. Performed at [facility], [address] [place], [place] [address]   Procalcitonin - Baseline     Status: None   Collection Time: 12/22/20  2:21 PM  Result Value Ref Range   Procalcitonin <0.10 ng/mL    Comment:        Interpretation: PCT (Procalcitonin) <= 0.5 ng/mL: Systemic infection (sepsis) is not likely. Local bacterial infection is possible. (NOTE)       Sepsis PCT Algorithm           Lower Respiratory Tract                                      Infection PCT Algorithm    ----------------------------     ----------------------------         PCT < 0.25 ng/mL                PCT < 0.10 ng/mL          Strongly encourage             Strongly discourage   discontinuation of antibiotics    initiation of antibiotics    ----------------------------     -----------------------------       PCT 0.25 - 0.50 ng/mL            PCT 0.10 - 0.25 ng/mL               OR       >80% decrease in PCT            Discourage initiation of                                            antibiotics      Encourage discontinuation           of antibiotics    ----------------------------     -----------------------------         PCT >= 0.50 ng/mL              PCT 0.26 - 0.50 ng/mL               AND        <80% decrease in PCT             Encourage initiation of                                             antibiotics       Encourage continuation           of antibiotics    ----------------------------     -----------------------------        PCT >= 0.50 ng/mL                  PCT > 0.50 ng/mL               AND  increase in PCT                  Strongly encourage                                       initiation of antibiotics    Strongly encourage escalation           of antibiotics                                     -----------------------------                                           PCT <= 0.25 ng/mL                                                 OR                                        > 80% decrease in PCT                                      Discontinue / Do not initiate                                             antibiotics  Performed at [facility], [address] [place], [place] [address]   D-dimer, quantitative     Status: Abnormal   Collection Time: 12/22/20  2:21 PM  Result Value Ref Range   D-Dimer, Quant 2.91 (H) 0.00 - 0.50 ug/mL-FEU    Comment: (NOTE) At the manufacturer cut-off value of 0.5 g/mL FEU, this assay has a negative predictive value of 95-100%.This assay is intended for use in conjunction with a clinical pretest probability (PTP) assessment model to exclude pulmonary embolism (PE) and deep venous thrombosis (DVT) in outpatients suspected of PE or DVT. Results should be correlated with clinical presentation. Performed at [facility], [address] [place], [place] [address]   Brain natriuretic peptide     Status: Abnormal   Collection Time: 12/22/20  2:21 PM  Result Value Ref Range   B Natriuretic Peptide 298.4 (H) 0.0 - 100.0 pg/mL    Comment: Performed at [facility], [address] [place], [place] [address]  Urine Drug Screen, Qualitative ([place] only)     Status: Abnormal   Collection Time: 12/22/20  2:48 PM  Result Value Ref Range   Tricyclic, Ur Screen NONE DETECTED NONE DETECTED   Amphetamines, Ur Screen NONE DETECTED NONE DETECTED   MDMA (Ecstasy)Ur Screen NONE DETECTED NONE DETECTED   Cocaine Metabolite,Ur [place] POSITIVE (A) NONE DETECTED   Opiate, Ur Screen NONE DETECTED NONE DETECTED   Phencyclidine (PCP) Ur S NONE DETECTED NONE DETECTED   Cannabinoid 50  Ng, Ur [place] POSITIVE (A) NONE DETECTED    Barbiturates, Ur Screen NONE DETECTED NONE DETECTED   Benzodiazepine, Ur Scrn NONE DETECTED NONE DETECTED   Methadone Scn, Ur NONE DETECTED NONE DETECTED    Comment: (NOTE) Tricyclics + metabolites, urine    Cutoff 1000 ng/mL Amphetamines + metabolites, urine  Cutoff 1000 ng/mL MDMA (Ecstasy), urine              Cutoff 500 ng/mL Cocaine Metabolite, urine          Cutoff 300 ng/mL Opiate + metabolites, urine        Cutoff 300 ng/mL Phencyclidine (PCP), urine         Cutoff 25 ng/mL Cannabinoid, urine                 Cutoff 50 ng/mL Barbiturates + metabolites, urine  Cutoff 200 ng/mL Benzodiazepine, urine              Cutoff 200 ng/mL Methadone, urine                   Cutoff 300 ng/mL  The urine drug screen provides only a preliminary, unconfirmed analytical test result and should not be used for non-medical purposes. Clinical consideration and professional judgment should be applied to any positive drug screen result due to possible interfering substances. A more specific alternate chemical method must be used in order to obtain a confirmed analytical result. Gas chromatography / mass spectrometry (GC/MS) is the preferred confirm atory method. Performed at [facility], [address] [place], [place] [address]   APTT     Status: None   Collection Time: 12/22/20  3:00 PM  Result Value Ref Range   aPTT 30 24 - 36 seconds    Comment: Performed at [facility], [address] [place], [place] [address]  Protime-INR     Status: None   Collection Time: 12/22/20  3:00 PM  Result Value Ref Range   Prothrombin Time 13.0 11.4 - 15.2 seconds   INR 1.0 0.8 - 1.2    Comment: (NOTE) INR goal varies based on device and disease states. Performed at [facility], [place], [place] [address]   Troponin I (High Sensitivity)     Status: Abnormal   Collection Time: 12/22/20  5:21 PM  Result Value Ref Range   Troponin I (High Sensitivity) 76 (H)  <18 ng/L    Comment: (NOTE) Elevated high sensitivity troponin I (hsTnI) values and significant  changes across serial measurements may suggest ACS but many other  chronic and acute conditions are known to elevate hsTnI results.  Refer to the "Links" section for chest pain algorithms and additional  guidance. Performed at [facility], [address] [place], [place] [address]   Glucose, capillary     Status: Abnormal   Collection Time: 12/22/20  8:23 PM  Result Value Ref Range   Glucose-Capillary 220 (H) 70 - 99 mg/dL    Comment: Glucose reference range applies only to samples taken after fasting for at least 8 hours.  Heparin level (unfractionated)     Status: None   Collection Time: 12/22/20 10:57 PM  Result Value Ref Range   Heparin Unfractionated 0.37 0.30 - 0.70 IU/mL    Comment: (NOTE) The clinical reportable range upper limit is being lowered to >1.10 to align with the FDA approved guidance for the current laboratory assay.  If heparin results are below expected values, and patient dosage has  been confirmed, suggest follow up testing of antithrombin III levels. Performed at [facility], [address] [place], [place] [address]   Hemoglobin A1c     Status: Abnormal   Collection Time: 12/22/20 10:57 PM  Result Value Ref Range   Hgb A1c MFr Bld 8.7 (H) 4.8 - 5.6 %    Comment: (NOTE) Pre diabetes:          5.7%-6.4%  Diabetes:              >6.4%  Glycemic control for   <7.0% adults with diabetes    Mean Plasma Glucose 202.99 mg/dL    Comment: Performed at [facility] [address] [place], [place] [address]  Glucose, capillary     Status: Abnormal   Collection Time: 12/23/20 12:47 AM  Result Value Ref Range   Glucose-Capillary 146 (H) 70 - 99 mg/dL    Comment: Glucose reference range applies only to samples taken after fasting for at least 8 hours.  HIV Antibody (routine testing w rflx)     Status: None   Collection Time:  12/23/20  6:53 AM  Result Value Ref Range   HIV Screen 4th Generation wRfx Non Reactive Non Reactive    Comment: Performed at [facility], [address] [place] [address]  CBC     Status: None   Collection Time: 12/23/20  6:53 AM  Result Value Ref Range   WBC 6.9 4.0 - 10.5 K/uL   RBC 4.73 4.22 - 5.81 MIL/uL   Hemoglobin 14.6 13.0 - 17.0 g/dL   HCT 43.2 39.0 - 52.0 %   MCV 91.3 80.0 - 100.0 fL   MCH 30.9 26.0 - 34.0 pg   MCHC 33.8 30.0 - 36.0 g/dL   RDW 13.3 11.5 - 15.5 %   Platelets 253 150 - 400 K/uL   nRBC 0.0 0.0 - 0.2 %    Comment: Performed at [facility], [place], [place] [address]  Heparin level (unfractionated)     Status: None   Collection Time: 12/23/20  6:53 AM  Result Value Ref Range   Heparin Unfractionated 0.35 0.30 - 0.70 IU/mL    Comment: (NOTE) The clinical reportable range upper limit is being lowered to >1.10 to align with the FDA approved guidance for the current laboratory assay.  If heparin results are below expected values, and patient dosage has  been confirmed, suggest follow up testing of antithrombin III levels. Performed at [facility], [address] [place], [place] [address]   Glucose, capillary     Status: Abnormal   Collection Time: 12/23/20  8:08 AM  Result Value Ref Range   Glucose-Capillary 165 (H) 70 - 99 mg/dL    Comment: Glucose reference range applies only to samples taken after fasting for at least 8 hours.  Glucose, capillary     Status: Abnormal   Collection Time: 12/23/20 11:56 AM  Result Value Ref Range   Glucose-Capillary 177 (H) 70 - 99 mg/dL    Comment: Glucose reference range applies only to samples taken after fasting for at least 8 hours.  Magnesium     Status: None   Collection Time: 12/23/20  3:21 PM  Result Value Ref Range   Magnesium 2.0 1.7 - 2.4 mg/dL    Comment: Performed at [facility], [address], [place], [place] [address]  Phosphorus      Status: None   Collection Time: 12/23/20  3:21 PM  Result Value Ref Range   Phosphorus 3.7 2.5 - 4.6 mg/dL    Comment: Performed at [facility], [address] [place], [place] [address]  CBC     Status: None   Collection Time: 12/23/20  3:21 PM  Result Value Ref Range   WBC 7.8 4.0 - 10.5 K/uL   RBC 4.63 4.22 - 5.81 MIL/uL   Hemoglobin 14.8 13.0 - 17.0 g/dL   HCT 43.4 39.0 - 52.0 %   MCV 93.7 80.0 - 100.0 fL   MCH 32.0 26.0 - 34.0 pg   MCHC 34.1 30.0 - 36.0 g/dL   RDW 13.4 11.5 - 15.5 %   Platelets 234 150 - 400 K/uL   nRBC 0.0 0.0 - 0.2 %    Comment: Performed at [facility], [address] [place], [place] [address]  Glucose, capillary     Status: Abnormal   Collection Time: 12/23/20  4:51 PM  Result Value Ref Range   Glucose-Capillary 157 (H) 70 - 99 mg/dL    Comment: Glucose reference range applies only to samples taken after fasting for at least 8 hours.  Glucose, capillary     Status: Abnormal   Collection Time: 12/23/20  8:53 PM  Result Value Ref Range   Glucose-Capillary 127 (H) 70 - 99 mg/dL    Comment: Glucose reference range applies only to samples taken after fasting for at least 8 hours.  Basic metabolic panel     Status: Abnormal   Collection Time: 12/24/20  6:56 AM  Result Value Ref Range   Sodium 140 135 - 145 mmol/L   Potassium 4.2 3.5 - 5.1 mmol/L   Chloride 108 98 - 111 mmol/L   CO2 25 22 - 32 mmol/L   Glucose, Bld 170 (H) 70 - 99 mg/dL    Comment: Glucose reference range applies only to samples taken after fasting for at least 8 hours.   BUN 18 8 - 23 mg/dL   Creatinine, Ser 1.25 (H) 0.61 - 1.24 mg/dL   Calcium 9.7 8.9 - 10.3 mg/dL   GFR, Estimated >60 >60 mL/min    Comment: (NOTE) Calculated using the CKD-EPI Creatinine Equation (2021)    Anion gap 7 5 - 15    Comment: Performed at [facility], [address] [place], [place] [address]  CBC     Status: None   Collection Time: 12/24/20  6:56 AM  Result Value Ref  Range   WBC 6.4 4.0 - 10.5 K/uL   RBC 4.50 4.22 - 5.81 MIL/uL   Hemoglobin 14.6 13.0 - 17.0 g/dL   HCT 41.9 39.0 - 52.0 %   MCV 93.1 80.0 - 100.0 fL   MCH 32.4 26.0 - 34.0 pg   MCHC 34.8 30.0 - 36.0 g/dL   RDW 13.5 11.5 - 15.5 %   Platelets 241 150 - 400 K/uL   nRBC 0.0 0.0 - 0.2 %    Comment: Performed at [facility], [place], [place] [address]  Heparin level (unfractionated)     Status: Abnormal   Collection Time: 12/24/20  6:56 AM  Result Value Ref Range   Heparin Unfractionated 0.25 (L) 0.30 - 0.70 IU/mL    Comment: (NOTE) The clinical reportable range upper limit is being lowered to >1.10 to align with the FDA approved guidance for the current laboratory assay.  If heparin results are below expected values, and patient dosage has  been confirmed, suggest follow up testing  of antithrombin III levels. Performed at [facility], [address] [place], [place] [address]   Glucose, capillary     Status: Abnormal   Collection Time: 12/24/20  7:47 AM  Result Value Ref Range   Glucose-Capillary 174 (H) 70 - 99 mg/dL    Comment: Glucose reference range applies only to samples taken after fasting for at least 8 hours.  Glucose, capillary     Status: Abnormal   Collection Time: 12/24/20 11:33 AM  Result Value Ref Range   Glucose-Capillary 208 (H) 70 - 99 mg/dL    Comment: Glucose reference range applies only to samples taken after fasting for at least 8 hours.  ECHOCARDIOGRAM COMPLETE     Status: None (In process)   Collection Time: 12/24/20  2:07 PM  Result Value Ref Range   Weight 4,160 oz   Height 71 in   BP 130/85 mmHg  Glucose, capillary     Status: Abnormal   Collection Time: 12/24/20  4:39 PM  Result Value Ref Range   Glucose-Capillary 102 (H) 70 - 99 mg/dL    Comment: Glucose reference range applies only to samples taken after fasting for at least 8 hours.  Heparin level (unfractionated)     Status: Abnormal   Collection Time:  12/24/20 10:32 PM  Result Value Ref Range   Heparin Unfractionated <0.10 (L) 0.30 - 0.70 IU/mL    Comment: CORRECTED RESULTS CALLED TO: TIERRA TURNER RN 0300 12/26/20 HNM (NOTE) The clinical reportable range upper limit is being lowered to >1.10 to align with the FDA approved guidance for the current laboratory assay.  If heparin results are below expected values, and patient dosage has  been confirmed, suggest follow up testing of antithrombin III levels. Performed at [name], [address] [place], [place] [address] CORRECTED ON 07/15 AT 0303: PREVIOUSLY REPORTED AS 3.61   Basic metabolic panel     Status: Abnormal   Collection Time: 12/24/20 10:32 PM  Result Value Ref Range   Sodium 140 135 - 145 mmol/L   Potassium 4.0 3.5 - 5.1 mmol/L   Chloride 111 98 - 111 mmol/L   CO2 23 22 - 32 mmol/L   Glucose, Bld 136 (H) 70 - 99 mg/dL    Comment: Glucose reference range applies only to samples taken after fasting for at least 8 hours.   BUN 20 8 - 23 mg/dL   Creatinine, Ser 1.26 (H) 0.61 - 1.24 mg/dL   Calcium 9.1 8.9 - 10.3 mg/dL   GFR, Estimated >60 >60 mL/min    Comment: (NOTE) Calculated using the CKD-EPI Creatinine Equation (2021)    Anion gap 6 5 - 15    Comment: Performed at [facility], [address] [place], [place] [address]  Magnesium     Status: None   Collection Time: 12/24/20 10:32 PM  Result Value Ref Range   Magnesium 2.2 1.7 - 2.4 mg/dL    Comment: Performed at [facility], [name] [place], [place] [address]  TSH     Status: None   Collection Time: 12/24/20 10:32 PM  Result Value Ref Range   TSH 2.007 0.350 - 4.500 uIU/mL    Comment: Performed by a 3rd Generation assay with a functional sensitivity of <=0.01 uIU/mL. Performed at [facility], [address] [place],  [address]   Glucose, capillary     Status: Abnormal   Collection Time: 12/24/20 10:43 PM  Result Value Ref Range  Glucose-Capillary 127 (H) 70 - 99 mg/dL    Comment: Glucose reference range applies only to samples taken after fasting for at least 8 hours.  MRSA Next Gen by PCR, Nasal     Status: None   Collection Time: 12/24/20 11:02 PM   Specimen: Nasal Mucosa; Nasal Swab  Result Value Ref Range   MRSA by PCR Next Gen NOT DETECTED NOT DETECTED    Comment: (NOTE) The GeneXpert MRSA Assay (FDA approved for NASAL specimens only), is one component of a comprehensive MRSA colonization surveillance program. It is not intended to diagnose MRSA infection nor to guide or monitor treatment for MRSA infections. Test performance is not FDA approved in patients less than 20 years old. Performed at [facility], [address] [place], [place] [address]   Glucose, capillary     Status: Abnormal   Collection Time: 12/25/20  7:16 AM  Result Value Ref Range   Glucose-Capillary 154 (H) 70 - 99 mg/dL    Comment: Glucose reference range applies only to samples taken after fasting for at least 8 hours.  CBC with Differential/Platelet     Status: None   Collection Time: 12/25/20  8:08 AM  Result Value Ref Range   WBC 6.0 4.0 - 10.5 K/uL   RBC 4.43 4.22 - 5.81 MIL/uL   Hemoglobin 13.9 13.0 - 17.0 g/dL   HCT 42.4 39.0 - 52.0 %   MCV 95.7 80.0 - 100.0 fL   MCH 31.4 26.0 - 34.0 pg   MCHC 32.8 30.0 - 36.0 g/dL   RDW 13.5 11.5 - 15.5 %   Platelets 178 150 - 400 K/uL   nRBC 0.0 0.0 - 0.2 %   Neutrophils Relative % 51 %   Neutro Abs 3.2 1.7 - 7.7 K/uL   Lymphocytes Relative 35 %   Lymphs Abs 2.1 0.7 - 4.0 K/uL   Monocytes Relative 10 %   Monocytes Absolute 0.6 0.1 - 1.0 K/uL   Eosinophils Relative 2 %   Eosinophils Absolute 0.1 0.0 - 0.5 K/uL   Basophils Relative 1 %   Basophils Absolute 0.0 0.0 - 0.1 K/uL   Immature Granulocytes 1 %   Abs Immature Granulocytes 0.03 0.00 - 0.07 K/uL    Comment: Performed at [facility], [place], [place] [address]  Heparin level  (unfractionated)     Status: None   Collection Time: 12/25/20  8:08 AM  Result Value Ref Range   Heparin Unfractionated 0.47 0.30 - 0.70 IU/mL    Comment: (NOTE) The clinical reportable range upper limit is being lowered to >1.10 to align with the FDA approved guidance for the current laboratory assay.  If heparin results are below expected values, and patient dosage has  been confirmed, suggest follow up testing of antithrombin III levels. Performed at [facility], [address] [place], [place] [address]   Basic metabolic panel     Status: Abnormal   Collection Time: 12/25/20  8:08 AM  Result Value Ref Range   Sodium 140 135 - 145 mmol/L   Potassium 5.2 (H) 3.5 - 5.1 mmol/L   Chloride 109 98 - 111 mmol/L   CO2 20 (L) 22 - 32 mmol/L   Glucose, Bld 178 (H) 70 - 99 mg/dL    Comment: Glucose reference range applies only to samples taken after fasting for at least 8 hours.   BUN 21 8 - 23 mg/dL   Creatinine, Ser 1.49 (H) 0.61 - 1.24 mg/dL  Calcium 9.1 8.9 - 10.3 mg/dL   GFR, Estimated 52 (L) >60 mL/min    Comment: (NOTE) Calculated using the CKD-EPI Creatinine Equation (2021)    Anion gap 11 5 - 15    Comment: Performed at [facility], [address] [place], [place] [address]  Glucose, capillary     Status: Abnormal   Collection Time: 12/25/20 11:07 AM  Result Value Ref Range   Glucose-Capillary 183 (H) 70 - 99 mg/dL    Comment: Glucose reference range applies only to samples taken after fasting for at least 8 hours.  Heparin level (unfractionated)     Status: None   Collection Time: 12/25/20  2:06 PM  Result Value Ref Range   Heparin Unfractionated 0.65 0.30 - 0.70 IU/mL    Comment: (NOTE) The clinical reportable range upper limit is being lowered to >1.10 to align with the FDA approved guidance for the current laboratory assay.  If heparin results are below expected values, and patient dosage has  been confirmed, suggest follow up testing of  antithrombin III levels. Performed at [facility], [address] [place], [place] [address]   Glucose, capillary     Status: Abnormal   Collection Time: 12/25/20  3:23 PM  Result Value Ref Range   Glucose-Capillary 159 (H) 70 - 99 mg/dL    Comment: Glucose reference range applies only to samples taken after fasting for at least 8 hours.  Blood gas, arterial     Status: Abnormal   Collection Time: 12/25/20  5:45 PM  Result Value Ref Range   FIO2 0.30    Delivery systems VENTILATOR    Mode PRESSURE REGULATED VOLUME CONTROL    VT 500 mL   LHR 15 resp/min   Peep/cpap 5.0 cm H20   pH, Arterial 7.30 (L) 7.350 - 7.450   pCO2 arterial 40 32.0 - 48.0 mmHg   pO2, Arterial 82 (L) 83.0 - 108.0 mmHg   Bicarbonate 19.7 (L) 20.0 - 28.0 mmol/L   Acid-base deficit 6.3 (H) 0.0 - 2.0 mmol/L   O2 Saturation 94.7 %   Patient temperature 37.0    Collection site LEFT RADIAL    Sample type ARTERIAL DRAW    Allens test (pass/fail) PASS PASS    Comment: Performed at [facility], [address] [place], [place] [address]  Basic metabolic panel     Status: Abnormal   Collection Time: 12/25/20  6:07 PM  Result Value Ref Range   Sodium 141 135 - 145 mmol/L   Potassium 5.1 3.5 - 5.1 mmol/L   Chloride 112 (H) 98 - 111 mmol/L   CO2 20 (L) 22 - 32 mmol/L   Glucose, Bld 184 (H) 70 - 99 mg/dL    Comment: Glucose reference range applies only to samples taken after fasting for at least 8 hours.   BUN 25 (H) 8 - 23 mg/dL   Creatinine, Ser 1.65 (H) 0.61 - 1.24 mg/dL   Calcium 9.0 8.9 - 10.3 mg/dL   GFR, Estimated 46 (L) >60 mL/min    Comment: (NOTE) Calculated using the CKD-EPI Creatinine Equation (2021)    Anion gap 9 5 - 15    Comment: Performed at [facility], [address] [place],  [address]  Glucose, capillary     Status: Abnormal   Collection Time: 12/25/20  9:33 PM  Result Value Ref Range   Glucose-Capillary 163 (H) 70 - 99 mg/dL    Comment: Glucose  reference range applies only  to samples taken after fasting for at least 8 hours.  Glucose, capillary     Status: Abnormal   Collection Time: 12/26/20  7:20 AM  Result Value Ref Range   Glucose-Capillary 133 (H) 70 - 99 mg/dL    Comment: Glucose reference range applies only to samples taken after fasting for at least 8 hours.  Basic metabolic panel     Status: Abnormal   Collection Time: 12/26/20  8:32 AM  Result Value Ref Range   Sodium 141 135 - 145 mmol/L   Potassium 5.6 (H) 3.5 - 5.1 mmol/L   Chloride 111 98 - 111 mmol/L   CO2 20 (L) 22 - 32 mmol/L   Glucose, Bld 139 (H) 70 - 99 mg/dL    Comment: Glucose reference range applies only to samples taken after fasting for at least 8 hours.   BUN 30 (H) 8 - 23 mg/dL   Creatinine, Ser 2.48 (H) 0.61 - 1.24 mg/dL   Calcium 8.4 (L) 8.9 - 10.3 mg/dL   GFR, Estimated 28 (L) >60 mL/min    Comment: (NOTE) Calculated using the CKD-EPI Creatinine Equation (2021)    Anion gap 10 5 - 15    Comment: Performed at [facility], [place], [place] [address]  Heparin level (unfractionated)     Status: Abnormal   Collection Time: 12/26/20  8:32 AM  Result Value Ref Range   Heparin Unfractionated 0.76 (H) 0.30 - 0.70 IU/mL    Comment: (NOTE) The clinical reportable range upper limit is being lowered to >1.10 to align with the FDA approved guidance for the current laboratory assay.  If heparin results are below expected values, and patient dosage has  been confirmed, suggest follow up testing of antithrombin III levels. Performed at [facility], [address] [place], [place] [address]   CBC     Status: Abnormal   Collection Time: 12/26/20  8:32 AM  Result Value Ref Range   WBC 10.8 (H) 4.0 - 10.5 K/uL   RBC 4.11 (L) 4.22 - 5.81 MIL/uL   Hemoglobin 13.1 13.0 - 17.0 g/dL   HCT 39.7 39.0 - 52.0 %   MCV 96.6 80.0 - 100.0 fL   MCH 31.9 26.0 - 34.0 pg   MCHC 33.0 30.0 - 36.0 g/dL   RDW 13.7 11.5 - 15.5 %    Platelets 210 150 - 400 K/uL   nRBC 0.2 0.0 - 0.2 %    Comment: Performed at [facility], [address] [place], [place] [address]  Glucose, capillary     Status: Abnormal   Collection Time: 12/26/20 11:14 AM  Result Value Ref Range   Glucose-Capillary 147 (H) 70 - 99 mg/dL    Comment: Glucose reference range applies only to samples taken after fasting for at least 8 hours.  Culture, Respiratory w Gram Stain     Status: None   Collection Time: 12/26/20 11:45 AM   Specimen: Tracheal Aspirate; Respiratory  Result Value Ref Range   Specimen Description      TRACHEAL ASPIRATE Performed at [facility], [address] [place], [place] [address]    Special Requests      NONE Performed at [facility], [place], [place] [address]    Gram Stain      ABUNDANT WBC PRESENT,BOTH PMN AND MONONUCLEAR RARE GRAM POSITIVE COCCI    Culture      RARE Normal respiratory flora-no Staph aureus or Pseudomonas seen Performed  at [facility], [place] [address] [place], [place] [address]    Report Status 12/28/2020 FINAL   Glucose, capillary     Status: Abnormal   Collection Time: 12/26/20  4:09 PM  Result Value Ref Range   Glucose-Capillary 134 (H) 70 - 99 mg/dL    Comment: Glucose reference range applies only to samples taken after fasting for at least 8 hours.  Heparin level (unfractionated)     Status: None   Collection Time: 12/26/20  4:28 PM  Result Value Ref Range   Heparin Unfractionated 0.59 0.30 - 0.70 IU/mL    Comment: (NOTE) The clinical reportable range upper limit is being lowered to >1.10 to align with the FDA approved guidance for the current laboratory assay.  If heparin results are below expected values, and patient dosage has  been confirmed, suggest follow up testing of antithrombin III levels. Performed at [facility], [address] [place], [place] [address]   Basic metabolic panel     Status: Abnormal    Collection Time: 12/26/20  4:28 PM  Result Value Ref Range   Sodium 137 135 - 145 mmol/L   Potassium 4.0 3.5 - 5.1 mmol/L   Chloride 107 98 - 111 mmol/L   CO2 22 22 - 32 mmol/L   Glucose, Bld 284 (H) 70 - 99 mg/dL    Comment: Glucose reference range applies only to samples taken after fasting for at least 8 hours.   BUN 28 (H) 8 - 23 mg/dL   Creatinine, Ser 2.02 (H) 0.61 - 1.24 mg/dL   Calcium 8.0 (L) 8.9 - 10.3 mg/dL   GFR, Estimated 36 (L) >60 mL/min    Comment: (NOTE) Calculated using the CKD-EPI Creatinine Equation (2021)    Anion gap 8 5 - 15    Comment: Performed at [facility], [address] [place], [place] [address]  Glucose, capillary     Status: Abnormal   Collection Time: 12/26/20  7:13 PM  Result Value Ref Range   Glucose-Capillary 138 (H) 70 - 99 mg/dL    Comment: Glucose reference range applies only to samples taken after fasting for at least 8 hours.  CKMB ([facility] only)     Status: None   Collection Time: 12/26/20  9:03 PM  Result Value Ref Range   CK, MB 3.0 0.5 - 5.0 ng/mL    Comment: Performed at [facility], [address] [place], [place] [address]  Urinalysis, Complete w Microscopic Urine, Catheterized     Status: Abnormal   Collection Time: 12/26/20 10:18 PM  Result Value Ref Range   Color, Urine YELLOW (A) YELLOW   APPearance CLOUDY (A) CLEAR   Specific Gravity, Urine 1.017 1.005 - 1.030   pH 5.0 5.0 - 8.0   Glucose, UA NEGATIVE NEGATIVE mg/dL   Hgb urine dipstick NEGATIVE NEGATIVE   Bilirubin Urine NEGATIVE NEGATIVE   Ketones, ur NEGATIVE NEGATIVE mg/dL   Protein, ur NEGATIVE NEGATIVE mg/dL   Nitrite NEGATIVE NEGATIVE   Leukocytes,Ua TRACE (A) NEGATIVE   RBC / HPF 0-5 0 - 5 RBC/hpf   WBC, UA 0-5 0 - 5 WBC/hpf   Bacteria, UA NONE SEEN NONE SEEN   Squamous Epithelial / LPF NONE SEEN 0 - 5   Mucus PRESENT    Hyaline Casts, UA PRESENT    Uric Acid Crys, UA PRESENT     Comment: Performed at [facility], [address] [place], [place] [address]  Sodium, urine, random  Status: None   Collection Time: 12/26/20 10:18 PM  Result Value Ref Range   Sodium, Ur 91 mmol/L    Comment: Performed at [facility], [address] [place], [place] [address]  Creatinine, urine, random     Status: None   Collection Time: 12/26/20 10:18 PM  Result Value Ref Range   Creatinine, Urine 158 mg/dL    Comment: Performed at [facility], [place], [place] [address]  Heparin level (unfractionated)     Status: None   Collection Time: 12/26/20 10:59 PM  Result Value Ref Range   Heparin Unfractionated 0.51 0.30 - 0.70 IU/mL    Comment: (NOTE) The clinical reportable range upper limit is being lowered to >1.10 to align with the FDA approved guidance for the current laboratory assay.  If heparin results are below expected values, and patient dosage has  been confirmed, suggest follow up testing of antithrombin III levels. Performed at [facility], [address] [place], [place] [address]   Magnesium     Status: None   Collection Time: 12/26/20 10:59 PM  Result Value Ref Range   Magnesium 2.0 1.7 - 2.4 mg/dL    Comment: Performed at [facility], [address] [place], [place] [address]  Phosphorus     Status: Abnormal   Collection Time: 12/26/20 10:59 PM  Result Value Ref Range   Phosphorus 4.9 (H) 2.5 - 4.6 mg/dL    Comment: Performed at [facility], [address] [place], [place] [address]  Glucose, capillary     Status: Abnormal   Collection Time: 12/26/20 11:09 PM  Result Value Ref Range   Glucose-Capillary 139 (H) 70 - 99 mg/dL    Comment: Glucose reference range applies only to samples taken after fasting for at least 8 hours.  Glucose, capillary     Status: Abnormal   Collection Time: 12/27/20  3:19 AM  Result Value Ref Range   Glucose-Capillary 135 (H) 70 - 99 mg/dL    Comment: Glucose reference range applies only to samples  taken after fasting for at least 8 hours.  Basic metabolic panel     Status: Abnormal   Collection Time: 12/27/20  5:02 AM  Result Value Ref Range   Sodium 141 135 - 145 mmol/L   Potassium 4.4 3.5 - 5.1 mmol/L   Chloride 110 98 - 111 mmol/L   CO2 26 22 - 32 mmol/L   Glucose, Bld 162 (H) 70 - 99 mg/dL    Comment: Glucose reference range applies only to samples taken after fasting for at least 8 hours.   BUN 24 (H) 8 - 23 mg/dL   Creatinine, Ser 1.52 (H) 0.61 - 1.24 mg/dL   Calcium 8.4 (L) 8.9 - 10.3 mg/dL   GFR, Estimated 51 (L) >60 mL/min    Comment: (NOTE) Calculated using the CKD-EPI Creatinine Equation (2021)    Anion gap 5 5 - 15    Comment: Performed at [facility], [address] [place], [place] [address]  CBC     Status: Abnormal   Collection Time: 12/27/20  5:02 AM  Result Value Ref Range   WBC 7.9 4.0 - 10.5 K/uL   RBC 3.62 (L) 4.22 - 5.81 MIL/uL   Hemoglobin 11.5 (L) 13.0 - 17.0 g/dL   HCT 35.4 (L) 39.0 - 52.0 %   MCV 97.8 80.0 - 100.0 fL   MCH 31.8 26.0 - 34.0 pg   MCHC 32.5 30.0 - 36.0  g/dL   RDW 13.6 11.5 - 15.5 %   Platelets 182 150 - 400 K/uL   nRBC 0.0 0.0 - 0.2 %    Comment: Performed at [facility], [address] [place], [place] [address]  Magnesium     Status: None   Collection Time: 12/27/20  5:02 AM  Result Value Ref Range   Magnesium 2.2 1.7 - 2.4 mg/dL    Comment: Performed at [facility], [address] [place], [place] [address]  Phosphorus     Status: None   Collection Time: 12/27/20  5:02 AM  Result Value Ref Range   Phosphorus 4.5 2.5 - 4.6 mg/dL    Comment: Performed at [facility], [place], [place] [address]  Heparin level (unfractionated)     Status: None   Collection Time: 12/27/20  5:02 AM  Result Value Ref Range   Heparin Unfractionated 0.38 0.30 - 0.70 IU/mL    Comment: (NOTE) The clinical reportable range upper limit is being lowered to >1.10 to align with the FDA  approved guidance for the current laboratory assay.  If heparin results are below expected values, and patient dosage has  been confirmed, suggest follow up testing of antithrombin III levels. Performed at [facility], [address] [place], [place] [address]   Glucose, capillary     Status: Abnormal   Collection Time: 12/27/20  7:27 AM  Result Value Ref Range   Glucose-Capillary 140 (H) 70 - 99 mg/dL    Comment: Glucose reference range applies only to samples taken after fasting for at least 8 hours.  Glucose, capillary     Status: Abnormal   Collection Time: 12/27/20 11:13 AM  Result Value Ref Range   Glucose-Capillary 187 (H) 70 - 99 mg/dL    Comment: Glucose reference range applies only to samples taken after fasting for at least 8 hours.  Glucose, capillary     Status: Abnormal   Collection Time: 12/27/20  3:21 PM  Result Value Ref Range   Glucose-Capillary 182 (H) 70 - 99 mg/dL    Comment: Glucose reference range applies only to samples taken after fasting for at least 8 hours.  Glucose, capillary     Status: Abnormal   Collection Time: 12/27/20  7:35 PM  Result Value Ref Range   Glucose-Capillary 158 (H) 70 - 99 mg/dL    Comment: Glucose reference range applies only to samples taken after fasting for at least 8 hours.  Glucose, capillary     Status: Abnormal   Collection Time: 12/27/20 11:29 PM  Result Value Ref Range   Glucose-Capillary 163 (H) 70 - 99 mg/dL    Comment: Glucose reference range applies only to samples taken after fasting for at least 8 hours.  Glucose, capillary     Status: Abnormal   Collection Time: 12/28/20  4:01 AM  Result Value Ref Range   Glucose-Capillary 191 (H) 70 - 99 mg/dL    Comment: Glucose reference range applies only to samples taken after fasting for at least 8 hours.  Basic metabolic panel     Status: Abnormal   Collection Time: 12/28/20  4:20 AM  Result Value Ref Range   Sodium 140 135 - 145 mmol/L   Potassium 4.9 3.5 -  5.1 mmol/L   Chloride 108 98 - 111 mmol/L   CO2 26 22 - 32 mmol/L   Glucose, Bld 207 (H) 70 - 99 mg/dL    Comment: Glucose reference range applies  only to samples taken after fasting for at least 8 hours.   BUN 24 (H) 8 - 23 mg/dL   Creatinine, Ser 1.36 (H) 0.61 - 1.24 mg/dL   Calcium 8.8 (L) 8.9 - 10.3 mg/dL   GFR, Estimated 58 (L) >60 mL/min    Comment: (NOTE) Calculated using the CKD-EPI Creatinine Equation (2021)    Anion gap 6 5 - 15    Comment: Performed at [facility], [address] [place], [place] [address]  Phosphorus     Status: None   Collection Time: 12/28/20  4:20 AM  Result Value Ref Range   Phosphorus 3.6 2.5 - 4.6 mg/dL    Comment: Performed at [facility], [address] [place], [place] [address]  CBC     Status: Abnormal   Collection Time: 12/28/20  4:20 AM  Result Value Ref Range   WBC 6.9 4.0 - 10.5 K/uL   RBC 3.55 (L) 4.22 - 5.81 MIL/uL   Hemoglobin 11.3 (L) 13.0 - 17.0 g/dL   HCT 35.4 (L) 39.0 - 52.0 %   MCV 99.7 80.0 - 100.0 fL   MCH 31.8 26.0 - 34.0 pg   MCHC 31.9 30.0 - 36.0 g/dL   RDW 13.7 11.5 - 15.5 %   Platelets 182 150 - 400 K/uL   nRBC 0.0 0.0 - 0.2 %    Comment: Performed at [facility], [place], [place] [address]  Heparin level (unfractionated)     Status: Abnormal   Collection Time: 12/28/20  4:20 AM  Result Value Ref Range   Heparin Unfractionated <0.10 (L) 0.30 - 0.70 IU/mL    Comment: (NOTE) The clinical reportable range upper limit is being lowered to >1.10 to align with the FDA approved guidance for the current laboratory assay.  If heparin results are below expected values, and patient dosage has  been confirmed, suggest follow up testing of antithrombin III levels. Performed at [facility], [address] [place], [place] [address]   Glucose, capillary     Status: Abnormal   Collection Time: 12/28/20  8:01 AM  Result Value Ref Range   Glucose-Capillary 200 (H)  70 - 99 mg/dL    Comment: Glucose reference range applies only to samples taken after fasting for at least 8 hours.  Glucose, capillary     Status: Abnormal   Collection Time: 12/28/20 11:47 AM  Result Value Ref Range   Glucose-Capillary 227 (H) 70 - 99 mg/dL    Comment: Glucose reference range applies only to samples taken after fasting for at least 8 hours.  Heparin level (unfractionated)     Status: Abnormal   Collection Time: 12/28/20  2:15 PM  Result Value Ref Range   Heparin Unfractionated <0.10 (L) 0.30 - 0.70 IU/mL    Comment: (NOTE) The clinical reportable range upper limit is being lowered to >1.10 to align with the FDA approved guidance for the current laboratory assay.  If heparin results are below expected values, and patient dosage has  been confirmed, suggest follow up testing of antithrombin III levels. Performed at [facility], [address] [place], [place] [address]   Basic metabolic panel     Status: Abnormal   Collection Time: 12/28/20  3:18 PM  Result Value Ref Range   Sodium 138 135 - 145 mmol/L   Potassium 4.7 3.5 - 5.1 mmol/L   Chloride 107 98 - 111 mmol/L   CO2 26 22 - 32 mmol/L  Glucose, Bld 301 (H) 70 - 99 mg/dL    Comment: Glucose reference range applies only to samples taken after fasting for at least 8 hours.   BUN 29 (H) 8 - 23 mg/dL   Creatinine, Ser 1.42 (H) 0.61 - 1.24 mg/dL   Calcium 8.7 (L) 8.9 - 10.3 mg/dL   GFR, Estimated 56 (L) >60 mL/min    Comment: (NOTE) Calculated using the CKD-EPI Creatinine Equation (2021)    Anion gap 5 5 - 15    Comment: Performed at [facility], [address] [place], [place] [address]  Procalcitonin - Baseline     Status: None   Collection Time: 12/28/20  3:18 PM  Result Value Ref Range   Procalcitonin <0.10 ng/mL    Comment:        Interpretation: PCT (Procalcitonin) <= 0.5 ng/mL: Systemic infection (sepsis) is not likely. Local bacterial infection is possible. (NOTE)        Sepsis PCT Algorithm           Lower Respiratory Tract                                      Infection PCT Algorithm    ----------------------------     ----------------------------         PCT < 0.25 ng/mL                PCT < 0.10 ng/mL          Strongly encourage             Strongly discourage   discontinuation of antibiotics    initiation of antibiotics    ----------------------------     -----------------------------       PCT 0.25 - 0.50 ng/mL            PCT 0.10 - 0.25 ng/mL               OR       >80% decrease in PCT            Discourage initiation of                                            antibiotics      Encourage discontinuation           of antibiotics    ----------------------------     -----------------------------         PCT >= 0.50 ng/mL              PCT 0.26 - 0.50 ng/mL               AND        <80% decrease in PCT             Encourage initiation of                                             antibiotics       Encourage continuation           of antibiotics    ----------------------------     -----------------------------        PCT >= 0.50 ng/mL  PCT > 0.50 ng/mL               AND         increase in PCT                  Strongly encourage                                      initiation of antibiotics    Strongly encourage escalation           of antibiotics                                     -----------------------------                                           PCT <= 0.25 ng/mL                                                 OR                                        > 80% decrease in PCT                                      Discontinue / Do not initiate                                             antibiotics  Performed at [facility], [address] [place], [place] [address]   Glucose, capillary     Status: Abnormal   Collection Time: 12/28/20  3:30 PM  Result Value Ref Range   Glucose-Capillary 189 (H) 70 - 99 mg/dL     Comment: Glucose reference range applies only to samples taken after fasting for at least 8 hours.  Culture, Respiratory w Gram Stain     Status: None   Collection Time: 12/28/20  3:56 PM   Specimen: SPU; Respiratory  Result Value Ref Range   Specimen Description      SPUTUM Performed at [facility], [address] [place], [place] [address]    Special Requests      NONE Performed at [facility], [place] [address]    Gram Stain      RARE WBC PRESENT, PREDOMINANTLY PMN RARE GRAM POSITIVE COCCI IN PAIRS IN CLUSTERS RARE GRAM NEGATIVE RODS    Culture      MODERATE KLEBSIELLA PNEUMONIAE NO STAPHYLOCOCCUS AUREUS ISOLATED No Pseudomonas species isolated Performed at [facility], [address], [place], [place] [address]    Report Status 12/31/2020 FINAL    Organism ID, Bacteria KLEBSIELLA PNEUMONIAE       Susceptibility   Klebsiella pneumoniae - MIC*  AMPICILLIN >=32 RESISTANT Resistant     CEFAZOLIN <=4 SENSITIVE Sensitive     CEFEPIME <=0.12 SENSITIVE Sensitive     CEFTAZIDIME <=1 SENSITIVE Sensitive     CEFTRIAXONE <=0.25 SENSITIVE Sensitive     CIPROFLOXACIN <=0.25 SENSITIVE Sensitive     GENTAMICIN <=1 SENSITIVE Sensitive     IMIPENEM <=0.25 SENSITIVE Sensitive     TRIMETH/SULFA <=20 SENSITIVE Sensitive     AMPICILLIN/SULBACTAM 4 SENSITIVE Sensitive     PIP/TAZO <=4 SENSITIVE Sensitive     * MODERATE KLEBSIELLA PNEUMONIAE  Glucose, capillary     Status: Abnormal   Collection Time: 12/28/20  7:40 PM  Result Value Ref Range   Glucose-Capillary 198 (H) 70 - 99 mg/dL    Comment: Glucose reference range applies only to samples taken after fasting for at least 8 hours.  Glucose, capillary     Status: Abnormal   Collection Time: 12/29/20 12:12 AM  Result Value Ref Range   Glucose-Capillary 188 (H) 70 - 99 mg/dL    Comment: Glucose reference range applies only to samples taken after fasting for at least 8 hours.   Heparin level (unfractionated)     Status: None   Collection Time: 12/29/20  2:20 AM  Result Value Ref Range   Heparin Unfractionated 0.59 0.30 - 0.70 IU/mL    Comment: (NOTE) The clinical reportable range upper limit is being lowered to >1.10 to align with the FDA approved guidance for the current laboratory assay.  If heparin results are below expected values, and patient dosage has  been confirmed, suggest follow up testing of antithrombin III levels. Performed at [facility], [address] [place], [place] [address]   Glucose, capillary     Status: Abnormal   Collection Time: 12/29/20  3:24 AM  Result Value Ref Range   Glucose-Capillary 193 (H) 70 - 99 mg/dL    Comment: Glucose reference range applies only to samples taken after fasting for at least 8 hours.  Basic metabolic panel     Status: Abnormal   Collection Time: 12/29/20  4:30 AM  Result Value Ref Range   Sodium 139 135 - 145 mmol/L    Comment: ELECTROLYTES REPEATED TO VERIFY DLB   Potassium 4.8 3.5 - 5.1 mmol/L   Chloride 110 98 - 111 mmol/L   CO2 26 22 - 32 mmol/L   Glucose, Bld 209 (H) 70 - 99 mg/dL    Comment: Glucose reference range applies only to samples taken after fasting for at least 8 hours.   BUN 35 (H) 8 - 23 mg/dL   Creatinine, Ser 1.36 (H) 0.61 - 1.24 mg/dL   Calcium 8.9 8.9 - 10.3 mg/dL   GFR, Estimated 58 (L) >60 mL/min    Comment: (NOTE) Calculated using the CKD-EPI Creatinine Equation (2021)    Anion gap 3 (L) 5 - 15    Comment: Performed at [facility], [address] [place], [place] [address]  Phosphorus     Status: None   Collection Time: 12/29/20  4:30 AM  Result Value Ref Range   Phosphorus 3.5 2.5 - 4.6 mg/dL    Comment: Performed at [facility], [address] [place], [place] [address]  CBC     Status: Abnormal   Collection Time: 12/29/20  4:30 AM  Result Value Ref Range   WBC 7.6 4.0 - 10.5 K/uL   RBC 3.30 (L) 4.22 - 5.81 MIL/uL    Hemoglobin 10.5 (L) 13.0 - 17.0 g/dL   HCT  32.9 (L) 39.0 - 52.0 %   MCV 99.7 80.0 - 100.0 fL   MCH 31.8 26.0 - 34.0 pg   MCHC 31.9 30.0 - 36.0 g/dL   RDW 13.8 11.5 - 15.5 %   Platelets 165 150 - 400 K/uL   nRBC 0.0 0.0 - 0.2 %    Comment: Performed at [facility], [address] [place], [place] [address]  Procalcitonin     Status: None   Collection Time: 12/29/20  4:30 AM  Result Value Ref Range   Procalcitonin <0.10 ng/mL    Comment:        Interpretation: PCT (Procalcitonin) <= 0.5 ng/mL: Systemic infection (sepsis) is not likely. Local bacterial infection is possible. (NOTE)       Sepsis PCT Algorithm           Lower Respiratory Tract                                      Infection PCT Algorithm    ----------------------------     ----------------------------         PCT < 0.25 ng/mL                PCT < 0.10 ng/mL          Strongly encourage             Strongly discourage   discontinuation of antibiotics    initiation of antibiotics    ----------------------------     -----------------------------       PCT 0.25 - 0.50 ng/mL            PCT 0.10 - 0.25 ng/mL               OR       >80% decrease in PCT            Discourage initiation of                                            antibiotics      Encourage discontinuation           of antibiotics    ----------------------------     -----------------------------         PCT >= 0.50 ng/mL              PCT 0.26 - 0.50 ng/mL               AND        <80% decrease in PCT             Encourage initiation of                                             antibiotics       Encourage continuation           of antibiotics    ----------------------------     -----------------------------        PCT >= 0.50 ng/mL                  PCT > 0.50 ng/mL  AND         increase in PCT                  Strongly encourage                                      initiation of antibiotics    Strongly encourage escalation            of antibiotics                                     -----------------------------                                           PCT <= 0.25 ng/mL                                                 OR                                        > 80% decrease in PCT                                      Discontinue / Do not initiate                                             antibiotics  Performed at [facility], [address], [place], [place] [address]   Magnesium     Status: None   Collection Time: 12/29/20  4:30 AM  Result Value Ref Range   Magnesium 2.1 1.7 - 2.4 mg/dL    Comment: Performed at [facility], [address] [place], [place] [address]  Folate, serum, performed at [facility] lab     Status: None   Collection Time: 12/29/20  4:30 AM  Result Value Ref Range   Folate >100.0 >5.9 ng/mL    Comment: Performed at [facility], [address] [place], [place] [address]  Glucose, capillary     Status: Abnormal   Collection Time: 12/29/20  7:18 AM  Result Value Ref Range   Glucose-Capillary 180 (H) 70 - 99 mg/dL    Comment: Glucose reference range applies only to samples taken after fasting for at least 8 hours.  Heparin level (unfractionated)     Status: None   Collection Time: 12/29/20  8:58 AM  Result Value Ref Range   Heparin Unfractionated 0.50 0.30 - 0.70 IU/mL    Comment: (NOTE) The clinical reportable range upper limit is being lowered to >1.10 to align with the FDA approved guidance for the current laboratory assay.  If heparin results are below expected values, and patient dosage has  been confirmed, suggest follow up testing of antithrombin III levels. Performed at [facility], [phone] [place], [place] [address]   Glucose, capillary     Status: Abnormal   Collection Time: 12/29/20 11:33 AM  Result Value Ref Range   Glucose-Capillary 234 (H) 70 - 99 mg/dL    Comment: Glucose reference range applies only to  samples taken after fasting for at least 8 hours.  Glucose, capillary     Status: Abnormal   Collection Time: 12/29/20  3:45 PM  Result Value Ref Range   Glucose-Capillary 202 (H) 70 - 99 mg/dL    Comment: Glucose reference range applies only to samples taken after fasting for at least 8 hours.  Glucose, capillary     Status: Abnormal   Collection Time: 12/29/20  7:53 PM  Result Value Ref Range   Glucose-Capillary 176 (H) 70 - 99 mg/dL    Comment: Glucose reference range applies only to samples taken after fasting for at least 8 hours.  Glucose, capillary     Status: Abnormal   Collection Time: 12/29/20 11:59 PM  Result Value Ref Range   Glucose-Capillary 170 (H) 70 - 99 mg/dL    Comment: Glucose reference range applies only to samples taken after fasting for at least 8 hours.  Glucose, capillary     Status: Abnormal   Collection Time: 12/30/20  4:18 AM  Result Value Ref Range   Glucose-Capillary 187 (H) 70 - 99 mg/dL    Comment: Glucose reference range applies only to samples taken after fasting for at least 8 hours.  Procalcitonin     Status: None   Collection Time: 12/30/20  5:00 AM  Result Value Ref Range   Procalcitonin <0.10 ng/mL    Comment:        Interpretation: PCT (Procalcitonin) <= 0.5 ng/mL: Systemic infection (sepsis) is not likely. Local bacterial infection is possible. (NOTE)       Sepsis PCT Algorithm           Lower Respiratory Tract                                      Infection PCT Algorithm    ----------------------------     ----------------------------         PCT < 0.25 ng/mL                PCT < 0.10 ng/mL          Strongly encourage             Strongly discourage   discontinuation of antibiotics    initiation of antibiotics    ----------------------------     -----------------------------       PCT 0.25 - 0.50 ng/mL            PCT 0.10 - 0.25 ng/mL               OR       >80% decrease in PCT            Discourage initiation of                                             antibiotics      Encourage discontinuation           of antibiotics    ----------------------------     -----------------------------  PCT >= 0.50 ng/mL              PCT 0.26 - 0.50 ng/mL               AND        <80% decrease in PCT             Encourage initiation of                                             antibiotics       Encourage continuation           of antibiotics    ----------------------------     -----------------------------        PCT >= 0.50 ng/mL                  PCT > 0.50 ng/mL               AND         increase in PCT                  Strongly encourage                                      initiation of antibiotics    Strongly encourage escalation           of antibiotics                                     -----------------------------                                           PCT <= 0.25 ng/mL                                                 OR                                        > 80% decrease in PCT                                      Discontinue / Do not initiate                                             antibiotics  Performed at [facility], [address] [place], [place] [address]   Heparin level (unfractionated)     Status: None   Collection Time: 12/30/20  5:00 AM  Result Value Ref Range   Heparin Unfractionated 0.61 0.30 - 0.70 IU/mL    Comment: (NOTE) The clinical reportable range upper limit is being lowered to >1.10 to align with  the FDA approved guidance for the current laboratory assay.  If heparin results are below expected values, and patient dosage has  been confirmed, suggest follow up testing of antithrombin III levels. Performed at [facility], [address] [place], [place] [address]   CBC     Status: Abnormal   Collection Time: 12/30/20  5:00 AM  Result Value Ref Range   WBC 8.0 4.0 - 10.5 K/uL   RBC 3.46 (L) 4.22 - 5.81 MIL/uL   Hemoglobin 11.1 (L) 13.0 - 17.0 g/dL    HCT 34.2 (L) 39.0 - 52.0 %   MCV 98.8 80.0 - 100.0 fL   MCH 32.1 26.0 - 34.0 pg   MCHC 32.5 30.0 - 36.0 g/dL   RDW 13.8 11.5 - 15.5 %   Platelets 208 150 - 400 K/uL   nRBC 0.0 0.0 - 0.2 %    Comment: Performed at [facility], [address] [place], [place] [address]  Basic metabolic panel     Status: Abnormal   Collection Time: 12/30/20  5:00 AM  Result Value Ref Range   Sodium 142 135 - 145 mmol/L   Potassium 3.9 3.5 - 5.1 mmol/L   Chloride 106 98 - 111 mmol/L   CO2 29 22 - 32 mmol/L   Glucose, Bld 197 (H) 70 - 99 mg/dL    Comment: Glucose reference range applies only to samples taken after fasting for at least 8 hours.   BUN 36 (H) 8 - 23 mg/dL   Creatinine, Ser 1.29 (H) 0.61 - 1.24 mg/dL   Calcium 9.1 8.9 - 10.3 mg/dL   GFR, Estimated >60 >60 mL/min    Comment: (NOTE) Calculated using the CKD-EPI Creatinine Equation (2021)    Anion gap 7 5 - 15    Comment: Performed at [facility], [address] [place], [place] [address]  Magnesium     Status: None   Collection Time: 12/30/20  5:00 AM  Result Value Ref Range   Magnesium 1.9 1.7 - 2.4 mg/dL    Comment: Performed at [facility], [address] [place], [place] [address]  Phosphorus     Status: None   Collection Time: 12/30/20  5:00 AM  Result Value Ref Range   Phosphorus 3.6 2.5 - 4.6 mg/dL    Comment: Performed at [facility], [address] [place], [place] [address]  Glucose, capillary     Status: Abnormal   Collection Time: 12/30/20  7:30 AM  Result Value Ref Range   Glucose-Capillary 162 (H) 70 - 99 mg/dL    Comment: Glucose reference range applies only to samples taken after fasting for at least 8 hours.  Glucose, capillary     Status: Abnormal   Collection Time: 12/30/20 11:29 AM  Result Value Ref Range   Glucose-Capillary 197 (H) 70 - 99 mg/dL    Comment: Glucose reference range applies only to samples taken after fasting for at least 8 hours.  Glucose,  capillary     Status: Abnormal   Collection Time: 12/30/20  3:43 PM  Result Value Ref Range   Glucose-Capillary 201 (H) 70 - 99 mg/dL    Comment: Glucose reference range applies only to samples taken after fasting for at least 8 hours.  Glucose, capillary     Status: Abnormal   Collection Time: 12/30/20  7:42 PM  Result Value Ref Range   Glucose-Capillary 120 (H) 70 - 99 mg/dL    Comment: Glucose reference range applies  only to samples taken after fasting for at least 8 hours.  Glucose, capillary     Status: Abnormal   Collection Time: 12/31/20 12:30 AM  Result Value Ref Range   Glucose-Capillary 155 (H) 70 - 99 mg/dL    Comment: Glucose reference range applies only to samples taken after fasting for at least 8 hours.  Glucose, capillary     Status: Abnormal   Collection Time: 12/31/20  4:16 AM  Result Value Ref Range   Glucose-Capillary 165 (H) 70 - 99 mg/dL    Comment: Glucose reference range applies only to samples taken after fasting for at least 8 hours.  Glucose, capillary     Status: Abnormal   Collection Time: 12/31/20  7:59 AM  Result Value Ref Range   Glucose-Capillary 157 (H) 70 - 99 mg/dL    Comment: Glucose reference range applies only to samples taken after fasting for at least 8 hours.  Glucose, capillary     Status: Abnormal   Collection Time: 12/31/20 11:23 AM  Result Value Ref Range   Glucose-Capillary 145 (H) 70 - 99 mg/dL    Comment: Glucose reference range applies only to samples taken after fasting for at least 8 hours.  Basic metabolic panel     Status: Abnormal   Collection Time: 12/31/20 11:54 AM  Result Value Ref Range   Sodium 141 135 - 145 mmol/L   Potassium 5.0 3.5 - 5.1 mmol/L   Chloride 107 98 - 111 mmol/L   CO2 22 22 - 32 mmol/L   Glucose, Bld 155 (H) 70 - 99 mg/dL    Comment: Glucose reference range applies only to samples taken after fasting for at least 8 hours.   BUN 31 (H) 8 - 23 mg/dL   Creatinine, Ser 0.89 0.61 - 1.24 mg/dL   Calcium 9.2  8.9 - 10.3 mg/dL   GFR, Estimated >60 >60 mL/min    Comment: (NOTE) Calculated using the CKD-EPI Creatinine Equation (2021)    Anion gap 12 5 - 15    Comment: Performed at [facility], [address] [place], [place] [address]  Magnesium     Status: None   Collection Time: 12/31/20 11:54 AM  Result Value Ref Range   Magnesium 2.0 1.7 - 2.4 mg/dL    Comment: Performed at [facility], [address] [place], [place] [address]  Phosphorus     Status: None   Collection Time: 12/31/20 11:54 AM  Result Value Ref Range   Phosphorus 3.4 2.5 - 4.6 mg/dL    Comment: Performed at [facility], [address] [place], [place] [address]  Glucose, capillary     Status: Abnormal   Collection Time: 12/31/20  5:59 PM  Result Value Ref Range   Glucose-Capillary 117 (H) 70 - 99 mg/dL    Comment: Glucose reference range applies only to samples taken after fasting for at least 8 hours.  Glucose, capillary     Status: Abnormal   Collection Time: 12/31/20  7:54 PM  Result Value Ref Range   Glucose-Capillary 170 (H) 70 - 99 mg/dL    Comment: Glucose reference range applies only to samples taken after fasting for at least 8 hours.  Glucose, capillary     Status: Abnormal   Collection Time: 12/31/20 11:47 PM  Result Value Ref Range   Glucose-Capillary 131 (H) 70 - 99 mg/dL    Comment: Glucose reference range applies only to samples taken after fasting for at least 8 hours.  Glucose, capillary     Status: Abnormal   Collection Time: 01/01/21  4:42 AM  Result Value Ref Range   Glucose-Capillary 104 (H) 70 - 99 mg/dL    Comment: Glucose reference range applies only to samples taken after fasting for at least 8 hours.  Basic metabolic panel     Status: Abnormal   Collection Time: 01/01/21  5:06 AM  Result Value Ref Range   Sodium 143 135 - 145 mmol/L   Potassium 3.7 3.5 - 5.1 mmol/L    Comment: HEMOLYSIS AT THIS LEVEL MAY AFFECT RESULT   Chloride 108 98 - 111 mmol/L    CO2 24 22 - 32 mmol/L   Glucose, Bld 129 (H) 70 - 99 mg/dL    Comment: Glucose reference range applies only to samples taken after fasting for at least 8 hours.   BUN 24 (H) 8 - 23 mg/dL   Creatinine, Ser 1.07 0.61 - 1.24 mg/dL   Calcium 9.0 8.9 - 10.3 mg/dL   GFR, Estimated >60 >60 mL/min    Comment: (NOTE) Calculated using the CKD-EPI Creatinine Equation (2021)    Anion gap 11 5 - 15    Comment: Performed at [facility], [address] [place], [place] [address]  Magnesium     Status: None   Collection Time: 01/01/21  5:06 AM  Result Value Ref Range   Magnesium 2.2 1.7 - 2.4 mg/dL    Comment: Performed at [facility], [address] [place], [place] [address]  Phosphorus     Status: None   Collection Time: 01/01/21  5:06 AM  Result Value Ref Range   Phosphorus 3.7 2.5 - 4.6 mg/dL    Comment: Performed at [facility], [address] [place], [place] [address]  Glucose, capillary     Status: Abnormal   Collection Time: 01/01/21  7:34 AM  Result Value Ref Range   Glucose-Capillary 130 (H) 70 - 99 mg/dL    Comment: Glucose reference range applies only to samples taken after fasting for at least 8 hours.   Comment 1 Notify RN   Glucose, capillary     Status: Abnormal   Collection Time: 01/01/21 11:50 AM  Result Value Ref Range   Glucose-Capillary 174 (H) 70 - 99 mg/dL    Comment: Glucose reference range applies only to samples taken after fasting for at least 8 hours.   Comment 1 Notify RN    Comment 2 Document in Chart   Glucose, capillary     Status: Abnormal   Collection Time: 01/01/21  3:15 PM  Result Value Ref Range   Glucose-Capillary 136 (H) 70 - 99 mg/dL    Comment: Glucose reference range applies only to samples taken after fasting for at least 8 hours.  Glucose, capillary     Status: Abnormal   Collection Time: 01/01/21  9:00 PM  Result Value Ref Range   Glucose-Capillary 117 (H) 70 - 99 mg/dL    Comment: Glucose reference range  applies only to samples taken after fasting for at least 8 hours.  Glucose, capillary     Status: Abnormal   Collection Time: 01/01/21 11:05 PM  Result Value Ref Range   Glucose-Capillary 197 (H) 70 - 99 mg/dL    Comment: Glucose reference range applies only to samples taken after fasting for at least 8 hours.  Basic metabolic panel     Status: Abnormal   Collection Time: 01/02/21  6:17 AM  Result Value Ref Range  Sodium 143 135 - 145 mmol/L   Potassium 3.1 (L) 3.5 - 5.1 mmol/L   Chloride 106 98 - 111 mmol/L   CO2 27 22 - 32 mmol/L   Glucose, Bld 170 (H) 70 - 99 mg/dL    Comment: Glucose reference range applies only to samples taken after fasting for at least 8 hours.   BUN 17 8 - 23 mg/dL   Creatinine, Ser 1.18 0.61 - 1.24 mg/dL   Calcium 9.3 8.9 - 10.3 mg/dL   GFR, Estimated >60 >60 mL/min    Comment: (NOTE) Calculated using the CKD-EPI Creatinine Equation (2021)    Anion gap 10 5 - 15    Comment: Performed at [facility], [address] [place], [place] [address]  Magnesium     Status: None   Collection Time: 01/02/21  6:17 AM  Result Value Ref Range   Magnesium 2.0 1.7 - 2.4 mg/dL    Comment: Performed at [facility], [address] [place], [place] [address]  Phosphorus     Status: None   Collection Time: 01/02/21  6:17 AM  Result Value Ref Range   Phosphorus 3.4 2.5 - 4.6 mg/dL    Comment: Performed at [facility], [address] [place], [place] [address]  CBC     Status: None   Collection Time: 01/02/21  6:17 AM  Result Value Ref Range   WBC 8.2 4.0 - 10.5 K/uL   RBC 4.25 4.22 - 5.81 MIL/uL   Hemoglobin 13.5 13.0 - 17.0 g/dL   HCT 40.9 39.0 - 52.0 %   MCV 96.2 80.0 - 100.0 fL   MCH 31.8 26.0 - 34.0 pg   MCHC 33.0 30.0 - 36.0 g/dL   RDW 13.5 11.5 - 15.5 %   Platelets 257 150 - 400 K/uL   nRBC 0.2 0.0 - 0.2 %    Comment: Performed at [facility], [address] [place], [place] [address]  Glucose, capillary      Status: Abnormal   Collection Time: 01/02/21  7:26 AM  Result Value Ref Range   Glucose-Capillary 188 (H) 70 - 99 mg/dL    Comment: Glucose reference range applies only to samples taken after fasting for at least 8 hours.  Glucose, capillary     Status: Abnormal   Collection Time: 01/02/21  4:25 PM  Result Value Ref Range   Glucose-Capillary 164 (H) 70 - 99 mg/dL    Comment: Glucose reference range applies only to samples taken after fasting for at least 8 hours.  Glucose, capillary     Status: Abnormal   Collection Time: 01/02/21  9:25 PM  Result Value Ref Range   Glucose-Capillary 144 (H) 70 - 99 mg/dL    Comment: Glucose reference range applies only to samples taken after fasting for at least 8 hours.  Glucose, capillary     Status: Abnormal   Collection Time: 01/03/21  9:06 AM  Result Value Ref Range   Glucose-Capillary 154 (H) 70 - 99 mg/dL    Comment: Glucose reference range applies only to samples taken after fasting for at least 8 hours.  Glucose, capillary     Status: Abnormal   Collection Time: 01/03/21  1:17 PM  Result Value Ref Range   Glucose-Capillary 218 (H) 70 - 99 mg/dL    Comment: Glucose reference range applies only to samples taken after fasting for at least 8 hours.  Basic metabolic panel     Status: Abnormal   Collection  Time: 01/06/21  5:47 PM  Result Value Ref Range   Sodium 138 135 - 145 mmol/L   Potassium 4.7 3.5 - 5.1 mmol/L    Comment: HEMOLYSIS AT THIS LEVEL MAY AFFECT RESULT   Chloride 110 98 - 111 mmol/L   CO2 18 (L) 22 - 32 mmol/L   Glucose, Bld 179 (H) 70 - 99 mg/dL    Comment: Glucose reference range applies only to samples taken after fasting for at least 8 hours.   BUN 16 8 - 23 mg/dL   Creatinine, Ser 1.07 0.61 - 1.24 mg/dL   Calcium 9.2 8.9 - 10.3 mg/dL   GFR, Estimated >60 >60 mL/min    Comment: (NOTE) Calculated using the CKD-EPI Creatinine Equation (2021)    Anion gap 10 5 - 15    Comment: Performed at [facility], [place], [place] [address]  Troponin I (High Sensitivity)     Status: Abnormal   Collection Time: 01/06/21  5:47 PM  Result Value Ref Range   Troponin I (High Sensitivity) 22 (H) <18 ng/L    Comment: (NOTE) Elevated high sensitivity troponin I (hsTnI) values and significant  changes across serial measurements may suggest ACS but many other  chronic and acute conditions are known to elevate hsTnI results.  Refer to the "Links" section for chest pain algorithms and additional  guidance. Performed at [facility], [address] [place], [place] [address]   CBC     Status: Abnormal   Collection Time: 01/06/21  6:12 PM  Result Value Ref Range   WBC 7.0 4.0 - 10.5 K/uL   RBC 4.22 4.22 - 5.81 MIL/uL   Hemoglobin 13.3 13.0 - 17.0 g/dL   HCT 39.8 39.0 - 52.0 %   MCV 94.3 80.0 - 100.0 fL   MCH 31.5 26.0 - 34.0 pg   MCHC 33.4 30.0 - 36.0 g/dL   RDW 13.4 11.5 - 15.5 %   Platelets 409 (H) 150 - 400 K/uL   nRBC 0.0 0.0 - 0.2 %    Comment: Performed at [facility], [address] [place], [place] [address]  Brain natriuretic peptide     Status: Abnormal   Collection Time: 01/06/21  6:12 PM  Result Value Ref Range   B Natriuretic Peptide 426.7 (H) 0.0 - 100.0 pg/mL    Comment: Performed at [facility], [address] [place],  [address]  Resp Panel by RT-PCR (Flu A&B, Covid) Nasopharyngeal Swab     Status: None   Collection Time: 01/06/21  6:50 PM   Specimen: Nasopharyngeal Swab; Nasopharyngeal(NP) swabs in vial transport medium  Result Value Ref Range   SARS Coronavirus 2 by RT PCR NEGATIVE NEGATIVE    Comment: (NOTE) SARS-CoV-2 target nucleic acids are NOT DETECTED.  The SARS-CoV-2 RNA is generally detectable in upper respiratory specimens during the acute phase of infection. The lowest concentration of SARS-CoV-2 viral copies this assay can detect is 138 copies/mL. A negative result does not preclude SARS-Cov-2 infection and  should not be used as the sole basis for treatment or other patient management decisions. A negative result may occur with  improper specimen collection/handling, submission of specimen other than nasopharyngeal swab, presence of viral mutation(s) within the areas targeted by this assay, and inadequate number of viral copies(<138 copies/mL). A negative result must be combined with clinical observations, patient history, and epidemiological information. The expected result is Negative.  Fact Sheet for Patients:  EntrepreneurPulse.com.au  Fact Sheet  for Healthcare Providers:  IncredibleEmployment.be  This test is no t yet approved or cleared by the [place] and  has been authorized for detection and/or diagnosis of SARS-CoV-2 by FDA under an Emergency Use Authorization (EUA). This EUA will remain  in effect (meaning this test can be used) for the duration of the COVID-19 declaration under Section 564(b)(1) of the Act, 21 U.S.C.section 360bbb-3(b)(1), unless the authorization is terminated  or revoked sooner.       Influenza A by PCR NEGATIVE NEGATIVE   Influenza B by PCR NEGATIVE NEGATIVE    Comment: (NOTE) The Xpert Xpress SARS-CoV-2/FLU/RSV plus assay is intended as an aid in the diagnosis of influenza from Nasopharyngeal swab specimens and should not be used as a sole basis for treatment. Nasal washings and aspirates are unacceptable for Xpert Xpress SARS-CoV-2/FLU/RSV testing.  Fact Sheet for Patients: EntrepreneurPulse.com.au  Fact Sheet for Healthcare Providers: IncredibleEmployment.be  This test is not yet approved or cleared by the [place] FDA and has been authorized for detection and/or diagnosis of SARS-CoV-2 by FDA under an Emergency Use Authorization (EUA). This EUA will remain in effect (meaning this test can be used) for the duration of the COVID-19 declaration under Section  564(b)(1) of the Act, 21 U.S.C. section 360bbb-3(b)(1), unless the authorization is terminated or revoked.  Performed at [facility], [address] [place], [place] [address]     Radiology DG Chest 2 View  Result Date: 01/06/2021 CLINICAL DATA:  Shortness of breath and palpitations. EXAM: CHEST - 2 VIEW COMPARISON:  December 29, 2020 FINDINGS: Multiple sternal wires are seen. The endotracheal tube and nasogastric tube seen on the prior study have been removed. Radiopaque surgical clips are seen just above the level of the aortic arch and overlying the left hilum. Mild, diffusely increased interstitial lung markings are present. There is no evidence of focal consolidation, pleural effusion or pneumothorax. The cardiac silhouette is moderately enlarged. The visualized skeletal structures are unremarkable. IMPRESSION: Stable cardiomegaly with very mild pulmonary edema. Electronically Signed   By: Virgina Norfolk M.D.   On: 01/06/2021 19:16    Assessment/Plan  Bilateral pulmonary embolism (HCC) Status post pulmonary thrombectomy with good results.  6 months of anticoagulation.  We can see him back in 5 to 6 months to discuss whether or not to continue anticoagulation or switch to aspirin at that time.  Essential hypertension blood pressure control important in reducing the progression of atherosclerotic disease. On appropriate oral medications.   Diabetes mellitus, type 2 (HCC) blood glucose control important in reducing the progression of atherosclerotic disease. Also, involved in wound healing. On appropriate medications.    Leotis Pain, MD  02/03/2021 3:24 PM    This note was created with Dragon medical transcription system.  Any errors from dictation are purely unintentional

## 2021-03-30 ENCOUNTER — Other Ambulatory Visit: Payer: Self-pay

## 2021-03-30 ENCOUNTER — Other Ambulatory Visit
Admission: RE | Admit: 2021-03-30 | Discharge: 2021-03-30 | Disposition: A | Discharge status: Home / Self Care | Payer: Medicaid Other | Source: Ambulatory Visit | Attending: Internal Medicine | Admitting: Internal Medicine

## 2021-03-30 DIAGNOSIS — Z20822 Contact with and (suspected) exposure to covid-19: Secondary | ICD-10-CM | POA: Diagnosis not present

## 2021-03-30 DIAGNOSIS — Z01812 Encounter for preprocedural laboratory examination: Secondary | ICD-10-CM | POA: Diagnosis not present

## 2021-03-30 LAB — SARS CORONAVIRUS 2 (TAT 6-24 HRS): SARS Coronavirus 2: NEGATIVE

## 2021-04-01 ENCOUNTER — Ambulatory Visit: Payer: Medicaid Other | Attending: Specialist

## 2021-04-01 DIAGNOSIS — G4733 Obstructive sleep apnea (adult) (pediatric): Secondary | ICD-10-CM | POA: Diagnosis not present

## 2021-04-03 ENCOUNTER — Other Ambulatory Visit: Payer: Self-pay

## 2021-05-10 ENCOUNTER — Other Ambulatory Visit: Payer: Self-pay

## 2021-05-10 ENCOUNTER — Emergency Department
Admission: EM | Admit: 2021-05-10 | Discharge: 2021-05-10 | Disposition: A | Discharge status: Home / Self Care | Payer: Medicaid Other | Attending: Emergency Medicine | Admitting: Emergency Medicine

## 2021-05-10 ENCOUNTER — Emergency Department: Payer: Medicaid Other

## 2021-05-10 DIAGNOSIS — R002 Palpitations: Secondary | ICD-10-CM | POA: Diagnosis present

## 2021-05-10 DIAGNOSIS — F17210 Nicotine dependence, cigarettes, uncomplicated: Secondary | ICD-10-CM | POA: Diagnosis not present

## 2021-05-10 DIAGNOSIS — Z7984 Long term (current) use of oral hypoglycemic drugs: Secondary | ICD-10-CM | POA: Diagnosis not present

## 2021-05-10 DIAGNOSIS — E119 Type 2 diabetes mellitus without complications: Secondary | ICD-10-CM | POA: Insufficient documentation

## 2021-05-10 DIAGNOSIS — I4892 Unspecified atrial flutter: Secondary | ICD-10-CM | POA: Insufficient documentation

## 2021-05-10 DIAGNOSIS — I10 Essential (primary) hypertension: Secondary | ICD-10-CM | POA: Insufficient documentation

## 2021-05-10 DIAGNOSIS — Z79899 Other long term (current) drug therapy: Secondary | ICD-10-CM | POA: Insufficient documentation

## 2021-05-10 DIAGNOSIS — Z7901 Long term (current) use of anticoagulants: Secondary | ICD-10-CM | POA: Insufficient documentation

## 2021-05-10 DIAGNOSIS — R0602 Shortness of breath: Secondary | ICD-10-CM | POA: Insufficient documentation

## 2021-05-10 DIAGNOSIS — Z20822 Contact with and (suspected) exposure to covid-19: Secondary | ICD-10-CM | POA: Insufficient documentation

## 2021-05-10 DIAGNOSIS — Z951 Presence of aortocoronary bypass graft: Secondary | ICD-10-CM | POA: Diagnosis not present

## 2021-05-10 DIAGNOSIS — I2510 Atherosclerotic heart disease of native coronary artery without angina pectoris: Secondary | ICD-10-CM | POA: Insufficient documentation

## 2021-05-10 DIAGNOSIS — I483 Typical atrial flutter: Secondary | ICD-10-CM

## 2021-05-10 LAB — CBC
HCT: 44.9 % (ref 39.0–52.0)
Hemoglobin: 15.3 g/dL (ref 13.0–17.0)
MCH: 31.2 pg (ref 26.0–34.0)
MCHC: 34.1 g/dL (ref 30.0–36.0)
MCV: 91.6 fL (ref 80.0–100.0)
Platelets: 253 10*3/uL (ref 150–400)
RBC: 4.9 MIL/uL (ref 4.22–5.81)
RDW: 14.8 % (ref 11.5–15.5)
WBC: 5.8 10*3/uL (ref 4.0–10.5)
nRBC: 0 % (ref 0.0–0.2)

## 2021-05-10 LAB — BASIC METABOLIC PANEL
Anion gap: 6 (ref 5–15)
BUN: 15 mg/dL (ref 8–23)
CO2: 25 mmol/L (ref 22–32)
Calcium: 9.6 mg/dL (ref 8.9–10.3)
Chloride: 108 mmol/L (ref 98–111)
Creatinine, Ser: 1.27 mg/dL — ABNORMAL HIGH (ref 0.61–1.24)
GFR, Estimated: >60 mL/min (ref >60)
Glucose, Bld: 110 mg/dL — ABNORMAL HIGH (ref 70–99)
Potassium: 4 mmol/L (ref 3.5–5.1)
Sodium: 139 mmol/L (ref 135–145)

## 2021-05-10 LAB — BRAIN NATRIURETIC PEPTIDE: B Natriuretic Peptide: 78.1 pg/mL (ref 0.0–100.0)

## 2021-05-10 LAB — TROPONIN I (HIGH SENSITIVITY)
Troponin I (High Sensitivity): 32 ng/L — ABNORMAL HIGH (ref <18)
Troponin I (High Sensitivity): 29 ng/L — ABNORMAL HIGH (ref <18)

## 2021-05-10 LAB — RESP PANEL BY RT-PCR (FLU A&B, COVID) ARPGX2
Influenza A by PCR: NEGATIVE
Influenza B by PCR: NEGATIVE
SARS Coronavirus 2 by RT PCR: NEGATIVE

## 2021-05-10 MED ORDER — METOPROLOL TARTRATE 5 MG/5ML IV SOLN: 2.5000 mg | Freq: Once | INTRAVENOUS | Status: DC

## 2021-05-10 MED ORDER — RIVAROXABAN 20 MG PO TABS
20.0000 mg | ORAL_TABLET | Freq: Every day | ORAL | 1 refills | Status: DC
  Filled 2021-05-10: qty 30, 30d supply, fill #0

## 2021-05-10 MED ORDER — METOPROLOL SUCCINATE ER 50 MG PO TB24
200.0000 mg | ORAL_TABLET | Freq: Every day | ORAL | Status: DC
  Administered 2021-05-10: 23:00:00 200 mg via ORAL
  Filled 2021-05-10: qty 4

## 2021-05-10 MED ORDER — ENTRESTO 49-51 MG PO TABS
1.0000 | ORAL_TABLET | Freq: Two times a day (BID) | ORAL | 1 refills | Status: DC
Start: 2021-05-10 — End: 2022-11-26
  Filled 2021-05-10: qty 60, 30d supply, fill #0

## 2021-05-10 MED ORDER — SPIRONOLACTONE 25 MG PO TABS
25.0000 mg | ORAL_TABLET | Freq: Every day | ORAL | 1 refills | Status: DC
  Filled 2021-05-10: qty 30, 30d supply, fill #0

## 2021-05-10 MED ORDER — FUROSEMIDE 20 MG PO TABS
20.0000 mg | ORAL_TABLET | Freq: Every day | ORAL | 1 refills | Status: DC
  Filled 2021-05-10: qty 30, 30d supply, fill #0

## 2021-05-10 MED ORDER — METFORMIN HCL 1000 MG PO TABS
1000.0000 mg | ORAL_TABLET | Freq: Every day | ORAL | 1 refills | Status: DC
  Filled 2021-05-10: qty 60, 60d supply, fill #0

## 2021-05-10 MED ORDER — METOPROLOL SUCCINATE ER 100 MG PO TB24
200.0000 mg | ORAL_TABLET | Freq: Every day | ORAL | 30 refills | Status: DC
  Filled 2021-05-10: qty 60, 30d supply, fill #0

## 2021-05-10 NOTE — ED Triage Notes (Signed)
Pt states that he started having increased HR and dizziness with exertion- pt states this has been going on for about 2 days- pt denies hx of afib

## 2021-05-10 NOTE — ED Provider Notes (Signed)
  Emergency Medicine Provider Triage Evaluation Note  Jeremy [name] , a 64 y.o.male,  was evaluated in triage.  Pt complains of chest discomfort.  Reports having increase of heart rate and dizziness with exertion for the past 2 days.  Denies fever/chills, shortness of breath, or abdominal pain.  No active symptoms at this time.   Review of Systems  Positive:   Chest pain, dizziness w/ exertion.  Negative: Denies fever, chest pain, vomiting  Physical Exam   Vitals:   05/10/21 1713  BP: (!) 140/93  Pulse: (!) 122  Resp: 20  SpO2: 96%   Gen:   Awake, no distress   Resp:  Normal effort  MSK:   Moves extremities without difficulty  Other:    Medical Decision Making  Given the patient's initial medical screening exam, the following diagnostic evaluation has been ordered. The patient will be placed in the appropriate treatment space, once one is available, to complete the evaluation and treatment. I have discussed the plan of care with the patient and I have advised the patient that an ED physician or mid-level practitioner will reevaluate their condition after the test results have been received, as the results may give them additional insight into the type of treatment they may need.    Diagnostics: EKG, CXR, labs  Treatments: none immediately   Varney Daily, [place] 05/10/21 1722    Phineas Semen, MD 05/10/21 [phone]

## 2021-05-10 NOTE — ED Provider Notes (Signed)
[facility] Emergency Department Provider Note  Time seen: 9:23 PM  I have reviewed the triage vital signs and the nursing notes.   HISTORY  Chief Complaint Palpitations   HPI Jeremy [name] is a 65 y.o. 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  According to the patient this afternoon he began feeling like his heart was racing at times with some dizziness.  Patient states intermittently has been ongoing for the past 2 or 3 days.  No history of irregular heartbeats noted atrial fibrillation or atrial flutter.  Patient appears to be in atrial flutter upon arrival.  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  Denies any shortness of breath nausea vomiting or diarrhea.  No fever cough or congestion.  Upon arrival patient noted to be in atrial flutter with a rate around 120 bpm.   Past Medical History:  Diagnosis Date   Coronary artery disease    Diabetes mellitus without complication ([place])    Hypertension    S/P CABG x 1     Patient Active Problem List   Diagnosis Date Noted   On mechanically assisted ventilation ([place]) 12/28/2020   Atrial fibrillation with RVR ([place]) 12/28/2020   Acute delirium 12/28/2020   Tobacco use 12/28/2020   Diabetes mellitus, type 2 ([place]) 12/28/2020   NSTEMI (non-ST elevated myocardial infarction) ([place])    Acute respiratory failure with hypoxia (HCC)    Bilateral pulmonary embolism ([place]) 12/22/2020   History of cocaine use 11/26/2019   History of marijuana use 22/29/7989   History of illicit drug use 21/19/4174   Pharmacologic therapy 11/26/2019   Acute pain (now resolved) 11/26/2019   MRSA (methicillin resistant Staphylococcus aureus) infection 10/18/2019   Hyperosmolar non-ketotic state due to type 2 diabetes mellitus ([place]) 08/21/2019   Hyponatremia 08/19/2019   AKI (acute kidney  injury) ([place]) 08/19/2019   Essential hypertension 08/19/2019   Medical non-compliance 08/19/2019   Post-traumatic osteoarthritis of right ankle 10/23/2018   Skin ulcer of right ankle with fat layer exposed ([place]) 10/23/2018   Status post ORIF of fracture of ankle 10/23/2018   Diabetic foot infection ([place]) 10/22/2018   Pressure injury of skin 02/11/2018    Past Surgical History:  Procedure Laterality Date   ANKLE ARTHROSCOPY W/ OPEN REPAIR Right    CORONARY ARTERY BYPASS GRAFT     PULMONARY THROMBECTOMY Bilateral 12/24/2020   Procedure: PULMONARY THROMBECTOMY;  Surgeon: Algernon Huxley, MD;  Location: [facility];  Service: Cardiovascular;  Laterality: Bilateral;    Prior to Admission medications   Medication Sig Start Date End Date Taking? Authorizing Provider  blood glucose meter kit and supplies KIT Dispense based on patient and insurance preference. Use up to four times daily as directed. (FOR ICD-9 250.00, 250.01). 08/21/19   Dhungel, Nishant, MD  ENTRESTO 49-51 MG Take 1 tablet by mouth 2 (two) times daily. 01/12/21   [provider]  furosemide (LASIX) 20 MG tablet Take 1 tablet (20 mg total) by mouth daily. 01/03/21 01/03/22  Wouk, Ailene Rud, MD  hydrOXYzine (ATARAX/VISTARIL) 25 MG tablet Take 25 mg by mouth 3 (three) times daily as needed for itching.    [provider]  losartan (COZAAR) 50 MG tablet Take 1 tablet (50 mg total) by mouth daily. 01/03/21   Wouk, Ailene Rud, MD  metFORMIN (GLUCOPHAGE) 1000 MG tablet Take 1  tablet (1,000 mg total) by mouth daily with breakfast. 01/03/21   Wouk, Ailene Rud, MD  metoprolol (TOPROL-XL) 200 MG 24 hr tablet Take 1 tablet (200 mg total) by mouth daily. Take with or immediately following a meal. 01/04/21   Wouk, Ailene Rud, MD  Rivaroxaban (XARELTO) 15 MG TABS tablet Take 1 tablet (15 mg total) by mouth 2 (two) times daily with a meal for 20 days. 01/03/21 01/23/21  Wouk, Ailene Rud, MD  rivaroxaban (XARELTO) 20 MG  TABS tablet Take 1 tablet (20 mg total) by mouth daily with supper. 01/23/21   Wouk, Ailene Rud, MD  sacubitril-valsartan (ENTRESTO) 49-51 MG Take 1 tablet by mouth 2 (two) times daily. 01/07/21   [provider]  spironolactone (ALDACTONE) 25 MG tablet Take 25 mg by mouth daily. 01/08/21   [provider]  tadalafil (CIALIS) 5 MG tablet Take 5 mg by mouth daily. 01/12/21   [provider]  Torsemide 40 MG TABS Take by mouth. 01/07/21 01/07/22  [provider]    Allergies  Allergen Reactions   Amoxicillin Swelling   Keflex [Cephalexin] Rash    Family History  Problem Relation Age of Onset   Heart attack Father     Social History Social History   Tobacco Use   Smoking status: Some Days    Packs/day: 0.50    Types: Cigarettes   Smokeless tobacco: Former  Scientific laboratory technician Use: Never used  Substance Use Topics   Alcohol use: Yes    Alcohol/week: 3.0 standard drinks    Types: 3 Glasses of wine per week   Drug use: Not Currently    Types: Marijuana    Comment: last use May 2022    Review of Systems Constitutional: Negative for fever.  Intermittent dizziness Cardiovascular: Negative for chest pain.  Positive for palpitations Respiratory: Negative for shortness of breath. Gastrointestinal: Negative for abdominal pain, vomiting and diarrhea. Musculoskeletal: Negative for musculoskeletal complaints Neurological: Negative for headache All other ROS negative  ____________________________________________   PHYSICAL EXAM:  VITAL SIGNS: ED Triage Vitals  Enc Vitals Group     BP 05/10/21 1713 (!) 140/93     Pulse Rate 05/10/21 1713 (!) 122     Resp 05/10/21 1713 20     Temp 05/10/21 1718 97.8 F (36.6 C)     Temp Source 05/10/21 1718 Oral     SpO2 05/10/21 1713 96 %     Weight 05/10/21 1713 255 lb (115.7 kg)     Height 05/10/21 1713 5' 11" (1.803 m)     Head Circumference --      Peak Flow --      Pain Score 05/10/21 1713 3     Pain  Loc --      Pain Edu? --      Excl. in [place]? --    Constitutional: Alert and oriented. Well appearing and in no distress. Eyes: Normal exam ENT      Head: Normocephalic and atraumatic.      Mouth/Throat: Mucous membranes are moist. Cardiovascular: Irregular rhythm rate around 120 bpm.   Respiratory: Normal respiratory effort without tachypnea nor retractions. Breath sounds are clear  Gastrointestinal: Soft and nontender. No distention. Musculoskeletal: Nontender with normal range of motion in all extremities. No lower extremity tenderness or edema. Neurologic:  Normal speech and language. No gross focal neurologic deficits are appreciated. Skin:  Skin is warm, dry and intact.  Psychiatric: Mood and affect are normal. Speech and behavior are normal.  ____________________________________________    EKG  EKG viewed and interpreted by myself shows atrial flutter with a variable AV block at 120 bpm.  Narrow QRS, normal axis, normal intervals besides QTC prolongation at 534 ms.  Nonspecific ST changes without ST elevation noted.  ____________________________________________    RADIOLOGY  Chest x-ray is negative for acute abnormality.  ____________________________________________   INITIAL IMPRESSION / ASSESSMENT AND PLAN / ED COURSE  Pertinent labs & imaging results that were available during my care of the patient were reviewed by me and considered in my medical decision making (see chart for details).   Patient presents emergency department for palpitations over the past 2 to 3 days.  Upon arrival patient noted to be in what appears to be atrial flutter with a variable AV block around 120 bpm.  Patient denies any history of atrial fibrillation or atrial flutter previously.  However I have reviewed the patient's hospital record in July he was admitted for pulmonary embolism started on Xarelto which the patient is still taking.  At that time he was noted to be in paroxysmal atrial  fibrillation and started on metoprolol.  Patient states he ran out of metoprolol about a month or so ago as well as several other of his medications including metformin and spironolactone.  Patient brought his prescriptions with him and he still has Xarelto which he states he is taking.  Patient's heart rate during my evaluations between 100 120 bpm.  Patient's lab work is thus far reassuring including a negative troponin.  We will repeat a troponin as a precaution.  If the patient's repeat troponin remains negative anticipate the patient could be discharged home with cardiology follow-up restarting the patient on his metoprolol as well as his other home medications and continue the patient on Xarelto.  Patient states he follows up with Dr. Clayborn Bigness of cardiology.  Patient agreeable this plan of care.  Patient's heart rate is now down to 76 after medications.  We will refill his medication.  Repeat troponin is pending.  As long as repeat troponin is negative anticipate discharge home.  Jackson Coffield was evaluated in Emergency Department on 05/10/2021 for the symptoms described in the history of present illness. He was evaluated in the context of the global COVID-19 pandemic, which necessitated consideration that the patient might be at risk for infection with the SARS-CoV-2 virus that causes COVID-19. Institutional protocols and algorithms that pertain to the evaluation of patients at risk for COVID-19 are in a state of rapid change based on information released by regulatory bodies including the CDC and federal and state organizations. These policies and algorithms were followed during the patient's care in the ED.  ____________________________________________   FINAL CLINICAL IMPRESSION(S) / ED DIAGNOSES  Atrial flutter Palpitations   Harvest Dark, MD 05/10/21 2326

## 2021-05-10 NOTE — ED Notes (Signed)
Charge nurse made aware of patient and need for bed.

## 2021-05-10 NOTE — ED Notes (Signed)
Unable to obtain IV access despite attempts by 2 RNs. MD notified.

## 2021-05-10 NOTE — ED Notes (Signed)
EDP at bedside  

## 2021-05-11 ENCOUNTER — Other Ambulatory Visit: Payer: Self-pay

## 2021-05-26 ENCOUNTER — Other Ambulatory Visit
Admission: RE | Admit: 2021-05-26 | Discharge: 2021-05-26 | Disposition: A | Discharge status: Home / Self Care | Payer: Medicaid Other | Source: Ambulatory Visit | Attending: Specialist | Admitting: Specialist

## 2021-05-26 ENCOUNTER — Other Ambulatory Visit: Payer: Self-pay

## 2021-05-26 DIAGNOSIS — Z20822 Contact with and (suspected) exposure to covid-19: Secondary | ICD-10-CM | POA: Diagnosis present

## 2021-05-27 ENCOUNTER — Other Ambulatory Visit: Payer: Medicaid Other

## 2021-05-27 LAB — SARS CORONAVIRUS 2 (TAT 6-24 HRS): SARS Coronavirus 2: NEGATIVE

## 2021-05-28 ENCOUNTER — Ambulatory Visit: Payer: Medicaid Other | Attending: Specialist

## 2021-05-28 DIAGNOSIS — G4733 Obstructive sleep apnea (adult) (pediatric): Secondary | ICD-10-CM | POA: Insufficient documentation

## 2021-05-28 DIAGNOSIS — R0683 Snoring: Secondary | ICD-10-CM | POA: Insufficient documentation

## 2021-05-28 DIAGNOSIS — E669 Obesity, unspecified: Secondary | ICD-10-CM | POA: Insufficient documentation

## 2021-05-28 DIAGNOSIS — R5383 Other fatigue: Secondary | ICD-10-CM | POA: Diagnosis not present

## 2021-05-28 DIAGNOSIS — Z6835 Body mass index (BMI) 35.0-35.9, adult: Secondary | ICD-10-CM | POA: Diagnosis not present

## 2021-05-29 ENCOUNTER — Other Ambulatory Visit: Payer: Self-pay

## 2021-07-06 ENCOUNTER — Emergency Department: Payer: Medicaid Other

## 2021-07-06 ENCOUNTER — Other Ambulatory Visit: Payer: Self-pay

## 2021-07-06 ENCOUNTER — Emergency Department
Admission: EM | Admit: 2021-07-06 | Discharge: 2021-07-06 | Disposition: A | Discharge status: Home / Self Care | Payer: Medicaid Other | Attending: Emergency Medicine | Admitting: Emergency Medicine

## 2021-07-06 DIAGNOSIS — I2510 Atherosclerotic heart disease of native coronary artery without angina pectoris: Secondary | ICD-10-CM | POA: Diagnosis not present

## 2021-07-06 DIAGNOSIS — J9801 Acute bronchospasm: Secondary | ICD-10-CM | POA: Insufficient documentation

## 2021-07-06 DIAGNOSIS — I10 Essential (primary) hypertension: Secondary | ICD-10-CM | POA: Diagnosis not present

## 2021-07-06 DIAGNOSIS — E119 Type 2 diabetes mellitus without complications: Secondary | ICD-10-CM | POA: Diagnosis not present

## 2021-07-06 DIAGNOSIS — R0602 Shortness of breath: Secondary | ICD-10-CM | POA: Diagnosis present

## 2021-07-06 DIAGNOSIS — Z20822 Contact with and (suspected) exposure to covid-19: Secondary | ICD-10-CM | POA: Diagnosis not present

## 2021-07-06 DIAGNOSIS — Z77098 Contact with and (suspected) exposure to other hazardous, chiefly nonmedicinal, chemicals: Secondary | ICD-10-CM | POA: Insufficient documentation

## 2021-07-06 LAB — TROPONIN I (HIGH SENSITIVITY)
Troponin I (High Sensitivity): 4 ng/L (ref <18)
Troponin I (High Sensitivity): 3 ng/L (ref <18)

## 2021-07-06 LAB — CBC WITH DIFFERENTIAL/PLATELET
Abs Immature Granulocytes: 0.03 10*3/uL (ref 0.00–0.07)
Basophils Absolute: 0.1 10*3/uL (ref 0.0–0.1)
Basophils Relative: 1 %
Eosinophils Absolute: 0.2 10*3/uL (ref 0.0–0.5)
Eosinophils Relative: 3 %
HCT: 44.4 % (ref 39.0–52.0)
Hemoglobin: 15 g/dL (ref 13.0–17.0)
Immature Granulocytes: 0 %
Lymphocytes Relative: 33 %
Lymphs Abs: 2.3 10*3/uL (ref 0.7–4.0)
MCH: 31.8 pg (ref 26.0–34.0)
MCHC: 33.8 g/dL (ref 30.0–36.0)
MCV: 94.1 fL (ref 80.0–100.0)
Monocytes Absolute: 0.5 10*3/uL (ref 0.1–1.0)
Monocytes Relative: 8 %
Neutro Abs: 3.9 10*3/uL (ref 1.7–7.7)
Neutrophils Relative %: 55 %
Platelets: 318 10*3/uL (ref 150–400)
RBC: 4.72 MIL/uL (ref 4.22–5.81)
RDW: 13.7 % (ref 11.5–15.5)
WBC: 7.1 10*3/uL (ref 4.0–10.5)
nRBC: 0 % (ref 0.0–0.2)

## 2021-07-06 LAB — BASIC METABOLIC PANEL
Anion gap: 8 (ref 5–15)
BUN: 18 mg/dL (ref 8–23)
CO2: 24 mmol/L (ref 22–32)
Calcium: 9.4 mg/dL (ref 8.9–10.3)
Chloride: 106 mmol/L (ref 98–111)
Creatinine, Ser: 1.51 mg/dL — ABNORMAL HIGH (ref 0.61–1.24)
GFR, Estimated: 51 mL/min — ABNORMAL LOW (ref >60)
Glucose, Bld: 144 mg/dL — ABNORMAL HIGH (ref 70–99)
Potassium: 4.5 mmol/L (ref 3.5–5.1)
Sodium: 138 mmol/L (ref 135–145)

## 2021-07-06 LAB — RESP PANEL BY RT-PCR (FLU A&B, COVID) ARPGX2
Influenza A by PCR: NEGATIVE
Influenza B by PCR: NEGATIVE
SARS Coronavirus 2 by RT PCR: NEGATIVE

## 2021-07-06 MED ORDER — SODIUM CHLORIDE 0.9 % IV BOLUS
500.0000 mL | Freq: Once | INTRAVENOUS | Status: DC
Start: 2021-07-06 — End: 2021-07-06

## 2021-07-06 MED ORDER — ALBUTEROL SULFATE (2.5 MG/3ML) 0.083% IN NEBU
2.5000 mg | INHALATION_SOLUTION | Freq: Once | RESPIRATORY_TRACT | Status: AC
Start: 2021-07-06 — End: 2021-07-06
  Administered 2021-07-06: 2.5 mg via RESPIRATORY_TRACT
  Filled 2021-07-06: qty 3

## 2021-07-06 MED ORDER — METHYLPREDNISOLONE SODIUM SUCC 125 MG IJ SOLR
125.0000 mg | Freq: Once | INTRAMUSCULAR | Status: AC
  Administered 2021-07-06: 125 mg via INTRAVENOUS
  Filled 2021-07-06: qty 2

## 2021-07-06 NOTE — ED Notes (Signed)
Pt refused DC vitals at this time  
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
## 2021-07-06 NOTE — Discharge Instructions (Signed)
Avoid further exposure to chlorine or other gases.  You should have no further symptoms from this inhalation.  Return to the ER for new, worsening, or persistent severe shortness of breath, cough, chest pain, fever, weakness, or any other new or worsening symptoms that concern you.
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
## 2021-07-07 ENCOUNTER — Ambulatory Visit (INDEPENDENT_AMBULATORY_CARE_PROVIDER_SITE_OTHER): Payer: Medicaid Other | Admitting: Vascular Surgery

## 2021-07-07 ENCOUNTER — Encounter (INDEPENDENT_AMBULATORY_CARE_PROVIDER_SITE_OTHER): Payer: Self-pay

## 2021-07-16 DIAGNOSIS — Z6836 Body mass index (BMI) 36.0-36.9, adult: Secondary | ICD-10-CM | POA: Insufficient documentation

## 2021-07-16 DIAGNOSIS — N183 Chronic kidney disease, stage 3 unspecified: Secondary | ICD-10-CM | POA: Insufficient documentation

## 2021-07-16 DIAGNOSIS — E669 Obesity, unspecified: Secondary | ICD-10-CM | POA: Insufficient documentation

## 2021-07-16 DIAGNOSIS — I700 Atherosclerosis of aorta: Secondary | ICD-10-CM | POA: Insufficient documentation

## 2021-07-16 DIAGNOSIS — Z951 Presence of aortocoronary bypass graft: Secondary | ICD-10-CM | POA: Insufficient documentation

## 2021-07-16 DIAGNOSIS — N1831 Chronic kidney disease, stage 3a: Secondary | ICD-10-CM | POA: Insufficient documentation

## 2021-09-07 ENCOUNTER — Encounter: Payer: Medicaid Other | Attending: Physician Assistant | Admitting: Physician Assistant

## 2021-09-07 ENCOUNTER — Other Ambulatory Visit: Payer: Self-pay

## 2021-09-07 DIAGNOSIS — I110 Hypertensive heart disease with heart failure: Secondary | ICD-10-CM | POA: Diagnosis not present

## 2021-09-07 DIAGNOSIS — E11621 Type 2 diabetes mellitus with foot ulcer: Secondary | ICD-10-CM | POA: Diagnosis not present

## 2021-09-07 DIAGNOSIS — M1990 Unspecified osteoarthritis, unspecified site: Secondary | ICD-10-CM | POA: Diagnosis not present

## 2021-09-07 DIAGNOSIS — I509 Heart failure, unspecified: Secondary | ICD-10-CM | POA: Insufficient documentation

## 2021-09-07 DIAGNOSIS — E11622 Type 2 diabetes mellitus with other skin ulcer: Secondary | ICD-10-CM | POA: Insufficient documentation

## 2021-09-07 DIAGNOSIS — M86471 Chronic osteomyelitis with draining sinus, right ankle and foot: Secondary | ICD-10-CM | POA: Insufficient documentation

## 2021-09-07 DIAGNOSIS — F17200 Nicotine dependence, unspecified, uncomplicated: Secondary | ICD-10-CM | POA: Insufficient documentation

## 2021-09-07 DIAGNOSIS — I2510 Atherosclerotic heart disease of native coronary artery without angina pectoris: Secondary | ICD-10-CM | POA: Diagnosis not present

## 2021-09-07 DIAGNOSIS — L97316 Non-pressure chronic ulcer of right ankle with bone involvement without evidence of necrosis: Secondary | ICD-10-CM | POA: Diagnosis not present

## 2021-09-07 DIAGNOSIS — I4891 Unspecified atrial fibrillation: Secondary | ICD-10-CM | POA: Diagnosis not present

## 2021-09-07 NOTE — Progress Notes (Signed)
IRENE, MITCHAM ([id number]) ?Visit Report for 09/07/2021 ?Chief Complaint Document Details ?Patient Name: Jeremy [name], Jeremy [name] ?Date of Service: 09/07/2021 10:00 AM ?Medical Record Number: [id number] ?Patient Account Number: [id number] ?Date of Birth/Sex: 11/20/1955 (66 y.o. M) ?Treating RN: Hansel Feinstein ?Primary Care Provider: Barbette Reichmann Other Clinician: ?Referring Provider: Rosetta Posner ?Treating Provider/Extender: Allen Derry ?Weeks in Treatment: 0 ?Information Obtained from: Patient ?Chief Complaint ?Right ankle ulcer ?Electronic Signature(s) ?Signed: 09/07/2021 11:01:13 AM By: Lenda Kelp PA-C ?Entered By: Lenda Kelp on 09/07/2021 11:01:13 ?FURKAN, KEENUM ([id number]) ?-------------------------------------------------------------------------------- ?HPI Details ?Patient Name: Jeremy [name], Jeremy [name] ?Date of Service: 09/07/2021 10:00 AM ?Medical Record Number: [id number] ?Patient Account Number: [id number] ?Date of Birth/Sex: 1956/05/31 (66 y.o. M) ?Treating RN: Hansel Feinstein ?Primary Care Provider: Barbette Reichmann Other Clinician: ?Referring Provider: Rosetta Posner ?Treating Provider/Extender: Allen Derry ?Weeks in Treatment: 0 ?History of Present Illness ?HPI Description: 08/18/2020 upon evaluation today patient presents for initial inspection here in our clinic concerning issues has been having with ?his right lateral ankle and this has been present for at least a year he tells me. He is a patient of Dr. Orland Jarred. Subsequently Dr. Orland Jarred has since ?retired hence the patient look this up and is coming here for wound care to see if we can help him out. He did have a screw pushed out of this ?location and since that time tells me that the screw was removed but nonetheless the hole has remained. Fortunately there does not appear to be ?any signs of active infection systemically at this point though it does raise the question as to whether or not this might be a bone/hardware ?infection being that this has been open  for so long. The patient does have a history of diabetes mellitus type 2, coronary artery disease, ?hypertension, and unfortunately does not seem to be doing as great as I would like to see him regard to his left ankle. ?08/25/2020 upon evaluation today patient appears to be doing well with regard to his ankle ulcer compared to last week this is definitely smaller. ?With that being said it still does probe down to bone/hardware. I still think this can be appropriate for him to see the orthopedic specialist. He has ?not heard from them yet he tells me. ?09/05/2020 upon evaluation today patient appears to be doing extremely well in regard to his wound all things considered. I do not see that this is ?gotten any worse. Unfortunately also do not think he is gotten any better. We did make a referral to orthopedics to Dr. Victorino Dike specifically but the ?patient tells me that he has not heard from them. That is unusual as they normally get in touch with patients fairly quickly. I will ask him if he ?possibly missed a phone call he tells me he will check when he gets home. Nonetheless he does not want to go to [facility] anyway which he ?did not tell me previously therefore we will get a see about making a referral to Dr. Logan Bores to see what he potentially could recommend for the ?patient as well I think Dr. Logan Bores is excellent and a good option here. ?Readmission: ?10/28/2020 upon evaluation today patient appears to be doing about the same as when I last saw him. He did not come back for follow-up last ?time I saw him was 09/05/2020. Basically he tells me that he has been attempting to manage this on his own using the Hydrofera Blue rope. He ?did get a call from [facility] but they did not actually get  him scheduled due to the fact that the patient told me that he told them he did ?not wanted to see them he just wanted to come back and see me because he was happy with my care. Nonetheless as I explained to the patient ?he has  a fractured screw at the site of the wound he also has another screw where there appears to be some loosening of the screw and potential ?for hardware/bone infection here. Subsequently I think he really does need to see a specialist to see what they can do to help him out I do not ?believe him to be able to get this healed without taking care of the hardware issues. 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 He has ?been advised previous of a need for an above-knee amputation due to some of the aggressive breakdown in the ankle region. Nonetheless he has ?had the removal of the screw performed in office with Dr. Excell SeltzerBaker and this was on 08/28/2021. Subsequently the patient is a poor historian he also ?gets upset very easily. Of note I do believe that he is going to require some dressings to be packed into the area due to the fact that to be honest ?this is still tracking all the way straight down to bone where there was pus and purulence coming out once I did get down to that area. ?Subsequently he tells me that he is having pain although it does seem to be doing better since the screw was removed. 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 Therefore his best options probably do entail the continue following up by Dr. Sampson GoonFitzgerald for infectious disease coupled with wound care ?as best we can and try to see if we keep this clean as much as possible and keep it from becoming more infected. At some point there may be a ?time where this could continually get worse and may end up not doing nearly as well but right now he does not seem to be doing too poorly and I ?think this is the best way to support him. I am not overly confident that this is ever getting heal  however. ?Electronic Signature(s) ?Signed: 09/07/2021 11:01:27 AM By: Lenda KelpStone III, Winta Barcelo PA-C ?Entered By: Lenda KelpStone III, Alease Fait on 09/07/2021 11:01:27 ?Jeremy [name], Donnis ([id number]) ?-------------------------------------------------------------------------------- ?Physical Exam Details ?Patient Name: Jeremy [name], Saagar ?Date of Service: 09/07/2021 10:00 AM ?Medical Record Number: [id number] ?Patient Account Number: [id number] ?Date of Birth/Sex: [birth year](65 y.o. M) ?Treating RN: Hansel FeinsteinBishop, Joy ?Primary Care Provider: Barbette ReichmannHande, Vishwanath Other Clinician: ?Referring Provider: Rosetta PosnerBaker, Andrew ?Treating Provider/Extender: Allen DerryStone, Dyer Klug ?Weeks in Treatment: 0 ?Constitutional ?supine blood pressure is within target range for patient.Marland Kitchen. respirations regular, non-labored and within target range for patient.Marland Kitchen. temperature within ?target range for patient.. Well-nourished and well-hydrated in no acute distress. ?Eyes ?conjunctiva clear no eyelid edema noted. pupils equal round and reactive to light and accommodation. ?Ears, Nose, Mouth, and Throat ?no gross abnormality of ear auricles or external auditory canals. normal hearing noted during conversation. mucus membranes moist. ?Respiratory ?normal breathing without difficulty. ?Cardiovascular ?2+ dorsalis pedis/posterior tibialis pulses. no clubbing, cyanosis, significant edema, <3 sec cap refill. ?Musculoskeletal ?normal gait and posture. no significant deformity or arthritic changes, no loss or range of motion, no clubbing. ?Psychiatric ?this patient is able to make  decisions and demonstrates good insight into disease process. Alert and Oriented x 3. pleasant and cooperative. ?Notes ?Upon inspection patient's wound bed actually showed signs of not doing nearly as well as we would like to see although its not too big. There is ?basically an area where this does go deeper into the bone region this is on the superior portion of the ankle. With that being said I do believe that ?the [facility]  would be an awesome option for this. ?Electronic Signature(s) ?Signed: 09/07/2021 11:04:06 AM By: Lenda Kelp PA-C ?Entered By: Lenda Kelp on 09/07/2021 11:04:06 ?COLLIER, BOHNET ([id number]) ?----------------

## 2021-09-07 NOTE — Progress Notes (Signed)
Jeremy [name], Jeremy [name] ([id number]) ?Visit Report for 09/07/2021 ?Allergy List Details ?Patient Name: Jeremy [name], Jeremy [name] ?Date of Service: 09/07/2021 10:00 AM ?Medical Record Number: [id number] ?Patient Account Number: [id number] ?Date of Birth/Sex: [birth year] (66 y.o. M) ?Treating RN: Donnamarie Poag ?Primary Care Sloane Palmer: Tracie Harrier Other Clinician: ?Referring Calyse Murcia: Caroline More ?Treating Keithen Capo/Extender: Jeri Cos ?Weeks in Treatment: 0 ?Allergies ?Active Allergies ?No Known Allergies ?Allergy Notes ?Electronic Signature(s) ?Signed: 09/07/2021 3:12:36 PM By: Donnamarie Poag ?Entered ByDonnamarie Poag on 09/07/2021 10:07:07 ?Jeremy [name], Jeremy [name] ([id number]) ?-------------------------------------------------------------------------------- ?Arrival Information Details ?Patient Name: Jeremy [name] ?Date of Service: 09/07/2021 10:00 AM ?Medical Record Number: [id number] ?Patient Account Number: [id number] ?Date of Birth/Sex: August 15, 1955 (66 y.o. M) ?Treating RN: Donnamarie Poag ?Primary Care Aubery Date: Tracie Harrier Other Clinician: ?Referring Conan Mcmanaway: Caroline More ?Treating Ellamay Fors/Extender: Jeri Cos ?Weeks in Treatment: 0 ?Visit Information ?Patient Arrived: Ambulatory ?Arrival Time: 10:02 ?Accompanied By: self ?Transfer Assistance: None ?Patient Identification Verified: Yes ?Secondary Verification Process Completed: Yes ?Patient Requires Transmission-Based No ?Precautions: ?Patient Has Alerts: Yes ?Patient Alerts: Patient on Blood ?Thinner ?Eliquis ?History Since Last Visit ?Electronic Signature(s) ?Signed: 09/07/2021 3:12:36 PM By: Donnamarie Poag ?Entered ByDonnamarie Poag on 09/07/2021 10:03:44 ?Jeremy [name], Jeremy [name] ([id number]) ?-------------------------------------------------------------------------------- ?Clinic Level of Care Assessment Details ?Patient Name: Jeremy [name], Jeremy [name] ?Date of Service: 09/07/2021 10:00 AM ?Medical Record Number: [id number] ?Patient Account Number: [id number] ?Date of Birth/Sex: April 19, 1956 (66 y.o.  M) ?Treating RN: Donnamarie Poag ?Primary Care Omari Koslosky: Tracie Harrier Other Clinician: ?Referring Giannina Bartolome: Caroline More ?Treating Magally Vahle/Extender: Jeri Cos ?Weeks in Treatment: 0 ?Clinic Level of Care Assessment Items ?TOOL 2 Quantity Score ?[]  - Use when only an EandM is performed on the INITIAL visit 0 ?ASSESSMENTS - Nursing Assessment / Reassessment ?X - General Physical Exam (combine w/ comprehensive assessment (listed just below) when performed on new ?1 20 ?pt. evals) ?X- 1 25 ?Comprehensive Assessment (HX, ROS, Risk Assessments, Wounds Hx, etc.) ?ASSESSMENTS - Wound and Skin Assessment / Reassessment ?X - Simple Wound Assessment / Reassessment - one wound 1 5 ?[]  - 0 ?Complex Wound Assessment / Reassessment - multiple wounds ?[]  - 0 ?Dermatologic / Skin Assessment (not related to wound area) ?ASSESSMENTS - Ostomy and/or Continence Assessment and Care ?[]  - Incontinence Assessment and Management 0 ?[]  - 0 ?Ostomy Care Assessment and Management (repouching, etc.) ?PROCESS - Coordination of Care ?X - Simple Patient / Family Education for ongoing care 1 15 ?[]  - 0 ?Complex (extensive) Patient / Family Education for ongoing care ?X- 1 10 ?Staff obtains Consents, Records, Test Results / Process Orders ?[]  - 0 ?Staff telephones HHA, Nursing Homes / Clarify orders / etc ?[]  - 0 ?Routine Transfer to another Facility (non-emergent condition) ?[]  - 0 ?Routine Hospital Admission (non-emergent condition) ?X- 1 15 ?New Admissions / Biomedical engineer / Ordering NPWT, Apligraf, etc. ?[]  - 0 ?Emergency Hospital Admission (emergent condition) ?X- 1 10 ?Simple Discharge Coordination ?[]  - 0 ?Complex (extensive) Discharge Coordination ?PROCESS - Special Needs ?[]  - Pediatric / Minor Patient Management 0 ?[]  - 0 ?Isolation Patient Management ?[]  - 0 ?Hearing / Language / Visual special needs ?[]  - 0 ?Assessment of Community assistance (transportation, D/C planning, etc.) ?[]  - 0 ?Additional assistance / Altered  mentation ?[]  - 0 ?Support Surface(s) Assessment (bed, cushion, seat, etc.) ?INTERVENTIONS - Wound Cleansing / Measurement ?X - Wound Imaging (photographs - any number of wounds) 1 5 ?[]  - 0 ?Wound Tracing (instead of photographs) ?X- 1 5 ?Simple Wound Measurement - one wound ?[]  - 0 ?Complex Wound Measurement - multiple wounds ?Jeremy [name], Jeremy [name] ([id number]) ?X- 1 5 ?Simple  Wound Cleansing - one wound ?[]  - 0 ?Complex Wound Cleansing - multiple wounds ?INTERVENTIONS - Wound Dressings ?X - Small Wound Dressing one or multiple wounds 1 10 ?[]  - 0 ?Medium Wound Dressing one or multiple wounds ?[]  - 0 ?Large Wound Dressing one or multiple wounds ?[]  - 0 ?Application of Medications - injection ?INTERVENTIONS - Miscellaneous ?[]  - External ear exam 0 ?[]  - 0 ?Specimen Collection (cultures, biopsies, blood, body fluids, etc.) ?[]  - 0 ?Specimen(s) / Culture(s) sent or taken to Lab for analysis ?[]  - 0 ?Patient Transfer (multiple staff / Civil Service fast streamer / Similar devices) ?[]  - 0 ?Simple Staple / Suture removal (25 or less) ?[]  - 0 ?Complex Staple / Suture removal (26 or more) ?[]  - 0 ?Hypo / Hyperglycemic Management (close monitor of Blood Glucose) ?X- 1 15 ?Ankle / Brachial Index (ABI) - do not check if billed separately ?Has the patient been seen at the hospital within the last three years: Yes ?Total Score: 140 ?Level Of Care: New/Established - Level ?4 ?Electronic Signature(s) ?Signed: 09/07/2021 3:12:36 PM By: Donnamarie Poag ?Entered By: Donnamarie Poag on 09/07/2021 10:56:33 ?Jeremy [name] ([id number]) ?-------------------------------------------------------------------------------- ?Encounter Discharge Information Details ?Patient Name: Jeremy [name], Jeremy [name] ?Date of Service: 09/07/2021 10:00 AM ?Medical Record Number: [id number] ?Patient Account Number: [id number] ?Date of Birth/Sex: Apr 15, 1956 (66 y.o. M) ?Treating RN: Donnamarie Poag ?Primary Care Annelise Mccoy: Tracie Harrier Other Clinician: ?Referring Vegas Fritze: Caroline More ?Treating  Jeremy [name]/Extender: Jeri Cos ?Weeks in Treatment: 0 ?Encounter Discharge Information Items ?Discharge Condition: Stable ?Ambulatory Status: Ambulatory ?Discharge Destination: Home ?Transportation: Other ?Accompanied By: self ?Schedule Follow-up Appointment: Yes ?Clinical Summary of Care: ?Electronic Signature(s) ?Signed: 09/07/2021 3:12:36 PM By: Donnamarie Poag ?Entered ByDonnamarie Poag on 09/07/2021 11:08:00 ?Jeremy [name], Jeremy [name] ([id number]) ?-------------------------------------------------------------------------------- ?Lower Extremity Assessment Details ?Patient Name: Jeremy [name], Jeremy [name] ?Date of Service: 09/07/2021 10:00 AM ?Medical Record Number: [id number] ?Patient Account Number: [id number] ?Date of Birth/Sex: May 09, 1956 (66 y.o. M) ?Treating RN: Donnamarie Poag ?Primary Care Latavious Bitter: Tracie Harrier Other Clinician: ?Referring Daizha Anand: Caroline More ?Treating Sherif Millspaugh/Extender: Jeri Cos ?Weeks in Treatment: 0 ?Edema Assessment ?Assessed: [Left: No] [Right: Yes] ?[Left: Edema] [Right: :] ?Calf ?Left: Right: ?Point of Measurement: 35 cm From Medial Instep 36.5 cm ?Ankle ?Left: Right: ?Point of Measurement: 11 cm From Medial Instep 25 cm ?Knee To Floor ?Left: Right: ?From Medial Instep 44 cm ?Vascular Assessment ?Pulses: ?Dorsalis Pedis ?Palpable: [Right:Yes] ?Blood Pressure: ?Brachial: [Right:114] ?Ankle: ?[Right:Dorsalis Pedis: 106 0.93] ?Electronic Signature(s) ?Signed: 09/07/2021 3:12:36 PM By: Donnamarie Poag ?Entered ByDonnamarie Poag on 09/07/2021 10:27:35 ?Jeremy [name], Jeremy [name] ([id number]) ?-------------------------------------------------------------------------------- ?Multi Wound Chart Details ?Patient Name: Jeremy [name], Jeremy [name] ?Date of Service: 09/07/2021 10:00 AM ?Medical Record Number: [id number] ?Patient Account Number: [id number] ?Date of Birth/Sex: [birth year] (66 y.o. M) ?Treating RN: Donnamarie Poag ?Primary Care Jaleal Schliep: Tracie Harrier Other Clinician: ?Referring Stephen Baruch: Caroline More ?Treating Aspen Lawrance/Extender:  Jeri Cos ?Weeks in Treatment: 0 ?Vital Signs ?Height(in): 71 ?Pulse(bpm): 68 ?Weight(lbs): 250 ?Blood Pressure(mmHg): 125/72 ?Body Mass Index(BMI): 34.9 ?Temperature(??F): 97.4 ?Respiratory Rate(breaths/min):

## 2021-09-07 NOTE — Progress Notes (Signed)
QASIM, DIVELEY ([id number]) ?Visit Report for 09/07/2021 ?Abuse Risk Screen Details ?Patient Name: Jeremy [name], Jeremy [name] ?Date of Service: 09/07/2021 10:00 AM ?Medical Record Number: [id number] ?Patient Account Number: [id number] ?Date of Birth/Sex: [birth year] (66 y.o. M) ?Treating RN: Hansel Feinstein ?Primary Care Vayla Wilhelmi: Barbette Reichmann Other Clinician: ?Referring Tieshia Rettinger: Rosetta Posner ?Treating Roshan Salamon/Extender: Allen Derry ?Weeks in Treatment: 0 ?Abuse Risk Screen Items ?Answer ?ABUSE RISK SCREEN: ?Has anyone close to you tried to hurt or harm you recentlyo No ?Do you feel uncomfortable with anyone in your familyo No ?Has anyone forced you do things that you didnot want to doo No ?Electronic Signature(s) ?Signed: 09/07/2021 3:12:36 PM By: Hansel Feinstein ?Entered By: Hansel Feinstein on 09/07/2021 10:13:28 ?[name] ([id number]) ?-------------------------------------------------------------------------------- ?Activities of Daily Living Details ?Patient Name: Jeremy [name], Jeremy [name] ?Date of Service: 09/07/2021 10:00 AM ?Medical Record Number: [id number] ?Patient Account Number: [id number] ?Date of Birth/Sex: [birth year] (66 y.o. M) ?Treating RN: Hansel Feinstein ?Primary Care Alila Sotero: Barbette Reichmann Other Clinician: ?Referring Pegi Milazzo: Rosetta Posner ?Treating Tavonna Worthington/Extender: Allen Derry ?Weeks in Treatment: 0 ?Activities of Daily Living Items ?Answer ?Activities of Daily Living (Please select one for each item) ?Drive Automobile Not Able ?Take Medications Need Assistance ?Use Telephone Completely Able ?Care for Appearance Completely Able ?Use Toilet Completely Able ?Bath / Shower Completely Able ?Dress Self Completely Able ?Feed Self Completely Able ?Walk Completely Able ?Get In / Out Bed Completely Able ?Housework Need Assistance ?Prepare Meals Need Assistance ?Handle Money Need Assistance ?Shop for Self Need Assistance ?Electronic Signature(s) ?Signed: 09/07/2021 3:12:36 PM By: Hansel Feinstein ?Entered ByHansel Feinstein on  09/07/2021 10:07:56 ?Jeremy [name], Jeremy [name] ([id number]) ?-------------------------------------------------------------------------------- ?Education Screening Details ?Patient Name: Jeremy [name], Jeremy [name] ?Date of Service: 09/07/2021 10:00 AM ?Medical Record Number: [id number] ?Patient Account Number: [id number] ?Date of Birth/Sex: 1956-04-08 (66 y.o. M) ?Treating RN: Hansel Feinstein ?Primary Care Sandie Swayze: Barbette Reichmann Other Clinician: ?Referring Yerlin Gasparyan: Rosetta Posner ?Treating Christoffer Currier/Extender: Allen Derry ?Weeks in Treatment: 0 ?Primary Learner Assessed: Patient ?Learning Preferences/Education Level/Primary Language ?Highest Education Level: High School ?Preferred Language: English ?Cognitive Barrier ?Language Barrier: No ?Translator Needed: No ?Memory Deficit: No ?Emotional Barrier: No ?Cultural/Religious Beliefs Affecting Medical Care: No ?Physical Barrier ?Impaired Vision: No ?Impaired Hearing: No ?Decreased Hand dexterity: No ?Knowledge/Comprehension ?Knowledge Level: Medium ?Comprehension Level: Medium ?Ability to understand written instructions: Medium ?Ability to understand verbal instructions: Medium ?Motivation ?Anxiety Level: Calm ?Cooperation: Cooperative ?Education Importance: Acknowledges Need ?Interest in Health Problems: Asks Questions ?Perception: Coherent ?Willingness to Engage in Self-Management ?High ?Activities: ?Readiness to Engage in Self-Management ?High ?Activities: ?Electronic Signature(s) ?Signed: 09/07/2021 3:12:36 PM By: Hansel Feinstein ?Entered ByHansel Feinstein on 09/07/2021 10:08:34 ?Jeremy [name], Jeremy [name] ([id number]) ?-------------------------------------------------------------------------------- ?Fall Risk Assessment Details ?Patient Name: Jeremy [name], Jeremy [name] ?Date of Service: 09/07/2021 10:00 AM ?Medical Record Number: [id number] ?Patient Account Number: [id number] ?Date of Birth/Sex: January 12, 1956 (66 y.o. M) ?Treating RN: Hansel Feinstein ?Primary Care Quintasha Gren: Barbette Reichmann Other Clinician: ?Referring  Genise Strack: Rosetta Posner ?Treating Miles Leyda/Extender: Allen Derry ?Weeks in Treatment: 0 ?Fall Risk Assessment Items ?Have you had 2 or more falls in the last 12 monthso 0 No ?Have you had any fall that resulted in injury in the last 12 monthso 0 No ?FALLS RISK SCREEN ?History of falling - immediate or within 3 months 0 No ?Secondary diagnosis (Do you have 2 or more medical diagnoseso) 0 No ?Ambulatory aid ?None/bed rest/wheelchair/nurse 0 Yes ?Crutches/cane/walker 0 No ?Furniture 0 No ?Intravenous therapy Access/Saline/Heparin Lock 0 No ?Gait/Transferring ?Normal/ bed rest/ wheelchair 0 Yes ?Weak (short steps with or without shuffle, stooped but able to lift head while walking, may ?0 No ?seek  support from furniture) ?Impaired (short steps with shuffle, may have difficulty arising from chair, head down, impaired ?0 No ?balance) ?Mental Status ?Oriented to own ability 0 Yes ?Electronic Signature(s) ?Signed: 09/07/2021 3:12:36 PM By: Hansel Feinstein ?Entered ByHansel Feinstein on 09/07/2021 10:08:50 ?Jeremy [name], Jeremy [name] ([id number]) ?-------------------------------------------------------------------------------- ?Foot Assessment Details ?Patient Name: Jeremy [name], Jeremy [name] ?Date of Service: 09/07/2021 10:00 AM ?Medical Record Number: [id number] ?Patient Account Number: [id number] ?Date of Birth/Sex: 1956-03-05 (66 y.o. M) ?Treating RN: Hansel Feinstein ?Primary Care Natalee Tomkiewicz: Barbette Reichmann Other Clinician: ?Referring Nakesha Ebrahim: Rosetta Posner ?Treating Caledonia Zou/Extender: Allen Derry ?Weeks in Treatment: 0 ?Foot Assessment Items ?Site Locations ?+ = Sensation present, - = Sensation absent, C = Callus, U = Ulcer ?R = Redness, W = Warmth, M = Maceration, PU = Pre-ulcerative lesion ?F = Fissure, S = Swelling, D = Dryness ?Assessment ?Right: Left: ?Other Deformity: Yes No ?Prior Foot Ulcer: No No ?Prior Amputation: No No ?Charcot Joint: No No ?Ambulatory Status: Ambulatory Without Help ?Gait: Steady ?Electronic Signature(s) ?Signed: 09/07/2021  3:12:36 PM By: Hansel Feinstein ?Entered ByHansel Feinstein on 09/07/2021 10:13:19 ?Jeremy [name], Jeremy [name] ([id number]) ?-------------------------------------------------------------------------------- ?Nutrition Risk Screening Details ?Patient Name: Jeremy [name], Jeremy [name] ?Date of Service: 09/07/2021 10:00 AM ?Medical Record Number: [id number] ?Patient Account Number: [id number] ?Date of Birth/Sex: 1956/03/27 (66 y.o. M) ?Treating RN: Hansel Feinstein ?Primary Care Estill Llerena: Barbette Reichmann Other Clinician: ?Referring Preethi Scantlebury: Rosetta Posner ?Treating Tilia Faso/Extender: Allen Derry ?Weeks in Treatment: 0 ?Height (in): 71 ?Weight (lbs): 250 ?Body Mass Index (BMI): 34.9 ?Nutrition Risk Screening Items ?Score Screening ?NUTRITION RISK SCREEN: ?I have an illness or condition that made me change the kind and/or amount of food I eat 0 No ?I eat fewer than two meals per day 0 No ?I eat few fruits and vegetables, or milk products 0 No ?I have three or more drinks of beer, liquor or wine almost every day 0 No ?I have tooth or mouth problems that make it hard for me to eat 0 No ?I don't always have enough money to buy the food I need 0 No ?I eat alone most of the time 0 No ?I take three or more different prescribed or over-the-counter drugs a day 1 Yes ?Without wanting to, I have lost or gained 10 pounds in the last six months 0 No ?I am not always physically able to shop, cook and/or feed myself 0 No ?Nutrition Protocols ?Good Risk Protocol ?Moderate Risk Protocol 0 Provide education on nutrition ?High Risk Proctocol ?Risk Level: Good Risk ?Score: 1 ?Electronic Signature(s) ?Signed: 09/07/2021 3:12:36 PM By: Hansel Feinstein ?Entered ByHansel Feinstein on 09/07/2021 10:09:18 ?

## 2021-09-15 ENCOUNTER — Encounter: Payer: Medicaid Other | Attending: Physician Assistant | Admitting: Physician Assistant

## 2021-09-15 DIAGNOSIS — E11622 Type 2 diabetes mellitus with other skin ulcer: Secondary | ICD-10-CM | POA: Diagnosis present

## 2021-09-15 DIAGNOSIS — M86471 Chronic osteomyelitis with draining sinus, right ankle and foot: Secondary | ICD-10-CM | POA: Insufficient documentation

## 2021-09-15 DIAGNOSIS — I2510 Atherosclerotic heart disease of native coronary artery without angina pectoris: Secondary | ICD-10-CM | POA: Diagnosis not present

## 2021-09-15 DIAGNOSIS — I10 Essential (primary) hypertension: Secondary | ICD-10-CM | POA: Diagnosis not present

## 2021-09-15 DIAGNOSIS — L97316 Non-pressure chronic ulcer of right ankle with bone involvement without evidence of necrosis: Secondary | ICD-10-CM | POA: Diagnosis not present

## 2021-09-15 DIAGNOSIS — E1169 Type 2 diabetes mellitus with other specified complication: Secondary | ICD-10-CM | POA: Diagnosis not present

## 2021-09-15 NOTE — Progress Notes (Signed)
CAYNAN, BROSIUS ([id number]) ?Visit Report for 09/15/2021 ?Arrival Information Details ?Patient Name: Jeremy [name], Jeremy [name] ?Date of Service: 09/15/2021 9:45 AM ?Medical Record Number: [id number] ?Patient Account Number: [id number] ?Date of Birth/Sex: [birth year] (66 y.o. M) ?Treating RN: Levora Dredge ?Primary Care Roxene Alviar: Tracie Harrier Other Clinician: ?Referring Lilyan Prete: Tracie Harrier ?Treating Aminta Sakurai/Extender: Jeri Cos ?Weeks in Treatment: 1 ?Visit Information History Since Last Visit ?Added or deleted any medications: No ?Patient Arrived: Ambulatory ?Any new allergies or adverse reactions: No ?Arrival Time: 09:43 ?Had a fall or experienced change in No ?Accompanied By: self ?activities of daily living that may affect ?Transfer Assistance: None ?risk of falls: ?Patient Identification Verified: Yes ?Hospitalized since last visit: No ?Secondary Verification Process Completed: Yes ?Has Dressing in Place as Prescribed: Yes ?Patient Requires Transmission-Based No ?Pain Present Now: No ?Precautions: ?Patient Has Alerts: Yes ?Patient Alerts: Patient on Blood ?Thinner ?Eliquis ?Electronic Signature(s) ?Signed: 09/15/2021 3:38:41 PM By: Levora Dredge ?Entered By: Levora Dredge on 09/15/2021 09:46:37 ?Jeremy [name] ([id number]) ?-------------------------------------------------------------------------------- ?Clinic Level of Care Assessment Details ?Patient Name: Jeremy [name], Jeremy [name] ?Date of Service: 09/15/2021 9:45 AM ?Medical Record Number: [id number] ?Patient Account Number: [id number] ?Date of Birth/Sex: 1956/04/05 (66 y.o. M) ?Treating RN: Levora Dredge ?Primary Care Shanise Balch: Tracie Harrier Other Clinician: ?Referring Florean Hoobler: Tracie Harrier ?Treating Anjulie Dipierro/Extender: Jeri Cos ?Weeks in Treatment: 1 ?Clinic Level of Care Assessment Items ?TOOL 4 Quantity Score ?[]  - Use when only an EandM is performed on FOLLOW-UP visit 0 ?ASSESSMENTS - Nursing Assessment / Reassessment ?[]  - Reassessment of  Co-morbidities (includes updates in patient status) 0 ?X- 1 5 ?Reassessment of Adherence to Treatment Plan ?ASSESSMENTS - Wound and Skin Assessment / Reassessment ?X - Simple Wound Assessment / Reassessment - one wound 1 5 ?[]  - 0 ?Complex Wound Assessment / Reassessment - multiple wounds ?[]  - 0 ?Dermatologic / Skin Assessment (not related to wound area) ?ASSESSMENTS - Focused Assessment ?[]  - Circumferential Edema Measurements - multi extremities 0 ?[]  - 0 ?Nutritional Assessment / Counseling / Intervention ?[]  - 0 ?Lower Extremity Assessment (monofilament, tuning fork, pulses) ?[]  - 0 ?Peripheral Arterial Disease Assessment (using hand held doppler) ?ASSESSMENTS - Ostomy and/or Continence Assessment and Care ?[]  - Incontinence Assessment and Management 0 ?[]  - 0 ?Ostomy Care Assessment and Management (repouching, etc.) ?PROCESS - Coordination of Care ?X - Simple Patient / Family Education for ongoing care 1 15 ?[]  - 0 ?Complex (extensive) Patient / Family Education for ongoing care ?[]  - 0 ?Staff obtains Consents, Records, Test Results / Process Orders ?[]  - 0 ?Staff telephones HHA, Nursing Homes / Clarify orders / etc ?[]  - 0 ?Routine Transfer to another Facility (non-emergent condition) ?[]  - 0 ?Routine Hospital Admission (non-emergent condition) ?[]  - 0 ?New Admissions / Biomedical engineer / Ordering NPWT, Apligraf, etc. ?[]  - 0 ?Emergency Hospital Admission (emergent condition) ?X- 1 10 ?Simple Discharge Coordination ?[]  - 0 ?Complex (extensive) Discharge Coordination ?PROCESS - Special Needs ?[]  - Pediatric / Minor Patient Management 0 ?[]  - 0 ?Isolation Patient Management ?[]  - 0 ?Hearing / Language / Visual special needs ?[]  - 0 ?Assessment of Community assistance (transportation, D/C planning, etc.) ?[]  - 0 ?Additional assistance / Altered mentation ?[]  - 0 ?Support Surface(s) Assessment (bed, cushion, seat, etc.) ?INTERVENTIONS - Wound Cleansing / Measurement ?ODIES, HAMMACK ([id number]) ?X- 1  5 ?Simple Wound Cleansing - one wound ?[]  - 0 ?Complex Wound Cleansing - multiple wounds ?X- 1 5 ?Wound Imaging (photographs - any number of wounds) ?[]  - 0 ?Wound Tracing (instead of photographs) ?X- 1 5 ?Simple Wound Measurement -  one wound ?[]  - 0 ?Complex Wound Measurement - multiple wounds ?INTERVENTIONS - Wound Dressings ?X - Small Wound Dressing one or multiple wounds 1 10 ?[]  - 0 ?Medium Wound Dressing one or multiple wounds ?[]  - 0 ?Large Wound Dressing one or multiple wounds ?[]  - 0 ?Application of Medications - topical ?[]  - 0 ?Application of Medications - injection ?INTERVENTIONS - Miscellaneous ?[]  - External ear exam 0 ?[]  - 0 ?Specimen Collection (cultures, biopsies, blood, body fluids, etc.) ?[]  - 0 ?Specimen(s) / Culture(s) sent or taken to Lab for analysis ?[]  - 0 ?Patient Transfer (multiple staff / Civil Service fast streamer / Similar devices) ?[]  - 0 ?Simple Staple / Suture removal (25 or less) ?[]  - 0 ?Complex Staple / Suture removal (26 or more) ?[]  - 0 ?Hypo / Hyperglycemic Management (close monitor of Blood Glucose) ?[]  - 0 ?Ankle / Brachial Index (ABI) - do not check if billed separately ?[]  - 0 ?Vital Signs ?Has the patient been seen at the hospital within the last three years: Yes ?Total Score: 60 ?Level Of Care: New/Established - Level ?2 ?Electronic Signature(s) ?Signed: 09/15/2021 3:38:41 PM By: Levora Dredge ?Entered By: Levora Dredge on 09/15/2021 10:11:57 ?Jeremy [name], Jeremy [name] ([id number]) ?-------------------------------------------------------------------------------- ?Encounter Discharge Information Details ?Patient Name: Jeremy [name], Jeremy [name] ?Date of Service: 09/15/2021 9:45 AM ?Medical Record Number: [id number] ?Patient Account Number: [id number] ?Date of Birth/Sex: 08/07/1955 (66 y.o. M) ?Treating RN: Levora Dredge ?Primary Care Jennene Downie: Tracie Harrier Other Clinician: ?Referring Donie Moulton: Tracie Harrier ?Treating Kashvi Prevette/Extender: Jeri Cos ?Weeks in Treatment: 1 ?Encounter Discharge  Information Items ?Discharge Condition: Stable ?Ambulatory Status: Ambulatory ?Discharge Destination: Home ?Transportation: Private Auto ?Accompanied By: self ?Schedule Follow-up Appointment: Yes ?Clinical Summary of Care: Patient Declined ?Electronic Signature(s) ?Signed: 09/15/2021 3:38:41 PM By: Levora Dredge ?Entered By: Levora Dredge on 09/15/2021 10:13:16 ?Jeremy [name], Jeremy [name] ([id number]) ?-------------------------------------------------------------------------------- ?Lower Extremity Assessment Details ?Patient Name: Jeremy [name], Jeremy [name] ?Date of Service: 09/15/2021 9:45 AM ?Medical Record Number: [id number] ?Patient Account Number: [id number] ?Date of Birth/Sex: 1955-10-03 (66 y.o. M) ?Treating RN: Levora Dredge ?Primary Care Nickalas Mccarrick: Tracie Harrier Other Clinician: ?Referring Tityana Pagan: Tracie Harrier ?Treating Trigg Delarocha/Extender: Jeri Cos ?Weeks in Treatment: 1 ?Edema Assessment ?Assessed: [Left: No] [Right: No] ?Edema: [Left: Ye] [Right: s] ?Calf ?Left: Right: ?Point of Measurement: 35 cm From Medial Instep 37 cm ?Ankle ?Left: Right: ?Point of Measurement: 11 cm From Medial Instep 27.5 cm ?Vascular Assessment ?Pulses: ?Dorsalis Pedis ?Palpable: [Right:Yes] ?Electronic Signature(s) ?Signed: 09/15/2021 3:38:41 PM By: Levora Dredge ?Entered By: Levora Dredge on 09/15/2021 09:58:12 ?Jeremy [name], Jeremy [name] ([id number]) ?-------------------------------------------------------------------------------- ?Multi Wound Chart Details ?Patient Name: Jeremy [name], Jeremy [name] ?Date of Service: 09/15/2021 9:45 AM ?Medical Record Number: [id number] ?Patient Account Number: [id number] ?Date of Birth/Sex: 08/17/1955 (66 y.o. M) ?Treating RN: Levora Dredge ?Primary Care Sanaya Gwilliam: Tracie Harrier Other Clinician: ?Referring Angela Platner: Tracie Harrier ?Treating Winda Summerall/Extender: Jeri Cos ?Weeks in Treatment: 1 ?Vital Signs ?Height(in): 71 ?Pulse(bpm): 78 ?Weight(lbs): 250 ?Blood Pressure(mmHg): 125/82 ?Body Mass Index(BMI):  34.9 ?Temperature(??F): 97.6 ?Respiratory Rate(breaths/min): 18 ?Photos: [N/A:N/A] ?Wound Location: Right, Lateral Ankle N/A N/A ?Wounding Event: Gradually Appeared N/A N/A ?Primary Etiology: Refractory Osteomyelitis N/A N/A ?S

## 2021-09-15 NOTE — Progress Notes (Addendum)
TORRI, LANGSTON ([id number]) ?Visit Report for 09/15/2021 ?Chief Complaint Document Details ?Patient Name: Jeremy [name], Jeremy [name] ?Date of Service: 09/15/2021 9:45 AM ?Medical Record Number: [id number] ?Patient Account Number: [id number] ?Date of Birth/Sex: January 24, 1956 (66 y.o. M) ?Treating RN: Angelina Pih ?Primary Care Provider: Barbette Reichmann Other Clinician: ?Referring Provider: Barbette Reichmann ?Treating Provider/Extender: Allen Derry ?Weeks in Treatment: 1 ?Information Obtained from: Patient ?Chief Complaint ?Right ankle ulcer ?Electronic Signature(s) ?Signed: 09/15/2021 9:59:45 AM By: Lenda Kelp PA-C ?Entered By: Lenda Kelp on 09/15/2021 09:59:44 ?Jeremy [name], Jeremy [name] ([id number]) ?-------------------------------------------------------------------------------- ?HPI Details ?Patient Name: Jeremy [name], Jeremy [name] ?Date of Service: 09/15/2021 9:45 AM ?Medical Record Number: [id number] ?Patient Account Number: [id number] ?Date of Birth/Sex: 05-Jan-1956 (66 y.o. M) ?Treating RN: Angelina Pih ?Primary Care Provider: Barbette Reichmann Other Clinician: ?Referring Provider: Barbette Reichmann ?Treating Provider/Extender: Allen Derry ?Weeks in Treatment: 1 ?History of Present Illness ?HPI Description: 08/18/2020 upon evaluation today patient presents for initial inspection here in our clinic concerning issues has been having with ?his right lateral ankle and this has been present for at least a year he tells me. He is a patient of Dr. Orland Jarred. Subsequently Dr. Orland Jarred has since ?retired hence the patient look this up and is coming here for wound care to see if we can help him out. He did have a screw pushed out of this ?location and since that time tells me that the screw was removed but nonetheless the hole has remained. Fortunately there does not appear to be ?any signs of active infection systemically at this point though it does raise the question as to whether or not this might be a bone/hardware ?infection being that this has  been open for so long. The patient does have a history of diabetes mellitus type 2, coronary artery disease, ?hypertension, and unfortunately does not seem to be doing as great as I would like to see him regard to his left ankle. ?08/25/2020 upon evaluation today patient appears to be doing well with regard to his ankle ulcer compared to last week this is definitely smaller. ?With that being said it still does probe down to bone/hardware. I still think this can be appropriate for him to see the orthopedic specialist. He has ?not heard from them yet he tells me. ?09/05/2020 upon evaluation today patient appears to be doing extremely well in regard to his wound all things considered. I do not see that this is ?gotten any worse. Unfortunately also do not think he is gotten any better. We did make a referral to orthopedics to Dr. Victorino Dike specifically but the ?patient tells me that he has not heard from them. That is unusual as they normally get in touch with patients fairly quickly. I will ask him if he ?possibly missed a phone call he tells me he will check when he gets home. Nonetheless he does not want to go to [facility] anyway which he ?did not tell me previously therefore we will get a see about making a referral to Dr. Logan Bores to see what he potentially could recommend for the ?patient as well I think Dr. Logan Bores is excellent and a good option here. ?Readmission: ?10/28/2020 upon evaluation today patient appears to be doing about the same as when I last saw him. He did not come back for follow-up last ?time I saw him was 09/05/2020. Basically he tells me that he has been attempting to manage this on his own using the Hydrofera Blue rope. He ?did get a call from [facility] but they did not actually get  him scheduled due to the fact that the patient told me that he told them he did ?not wanted to see them he just wanted to come back and see me because he was happy with my care. Nonetheless as I explained to the  patient ?he has a fractured screw at the site of the wound he also has another screw where there appears to be some loosening of the screw and potential ?for hardware/bone infection here. Subsequently I think he really does need to see a specialist to see what they can do to help him out I do not ?believe him to be able to get this healed without taking care of the hardware issues. 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 He has ?been advised previous of a need for an above-knee amputation due to some of the aggressive breakdown in the ankle region. Nonetheless he has ?had the removal of the screw performed in office with Dr. Excell Seltzer and this was on 08/28/2021. Subsequently the patient is a poor historian he also ?gets upset very easily. Of note I do believe that he is going to require some dressings to be packed into the area due to the fact that to be honest ?this is still tracking all the way straight down to bone where there was pus and purulence coming out once I did get down to that area. ?Subsequently he tells me that he is having pain although it does seem to be doing better since the screw was removed. 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 Therefore his best options probably do entail the continue following up by Dr. Sampson Goon for infectious disease coupled with wound care ?as best we can and try to see if we keep this clean as much as possible and keep it from becoming more infected. At some point there may be a ?time where this could continually get worse and may end up not doing nearly as well but right now he does not seem to be doing too poorly and I ?think this is the best way to support him. I am not overly confident that this is ever getting heal  however. ?09/15/21 upon evaluation today patient appears to be doing well with regard to his wound. He has been tolerating the dressing changes without ?complication. Fortunately I do not see any signs of active infection at this time. Overall I think that he is doing quite well. ?Electronic Signature(s) ?Signed: 09/15/2021 10:07:03 AM By: Lenda Kelp PA-C ?Entered By: Lenda Kelp on 09/15/2021 10:07:03 ?Jeremy [name], Jeremy [name] ([id number]) ?-------------------------------------------------------------------------------- ?Physical Exam Details ?Patient Name: Jeremy [name], Jeremy [name] ?Date of Service: 09/15/2021 9:45 AM ?Medical Record Number: [id number] ?Patient Account Number: [id number] ?Date of Birth/Sex: 07/01/1955 (66 y.o. M) ?Treating RN: Angelina Pih ?Primary Care Provider: Barbette Reichmann Other Clinician: ?Referring Provider: Barbette Reichmann ?Treating Provider/Extender: Allen Derry ?Weeks in Treatment: 1 ?Constitutional ?Well-nourished and well-hydrated in no acute distress. ?Respiratory ?normal breathing without difficulty. ?Psychiatric ?this patient is able to make decisions and demonstrates good insight into disease process. Alert and Oriented x 3. pleasant and cooperative. ?Notes ?Upon inspection patient's wound still has some purulent drainage noted but I feel like this is much better than it was last week and very pleased in ?that regard. ?Electronic Signature(s) ?Signed: 09/15/2021 10:07:18  AM By: Lenda KelpStone III, Barrett Holthaus PA-C ?Entered By: Lenda KelpStone III, Winni Ehrhard on 09/15/2021 10:07:17 ?Jeremy [name], Jeremy [name] ([id number]) ?-------------------------------------------------------------------------------- ?Physician Orders Details ?Patient Name: Jeremy [name], Jeremy [name] ?Date of Service: 09/15/2021 9:45 AM ?Medical Record Number: [id number] ?Patient Account Number: [id number] ?Date of Birth/Sex: [birth year](65 y.o. M) ?Treating RN: Angelina PihGordon, Caitlin ?Primary Care Provider: Barbette ReichmannHande, Vishwanath Other Clinician: ?Referring Provider: Barbette ReichmannHande,  Vishwanath ?Treating Provider/Extender: Allen DerryStone, Jamarr Treinen ?Weeks in Treatment: 1 ?Verbal / Phone Orders: No ?Diagnosis Coding ?ICD-10 Coding ?Code Description ?M86.471 Chronic osteomyelitis with draining sinus, right ankle and fo

## 2021-09-22 ENCOUNTER — Encounter: Payer: Medicaid Other | Admitting: Physician Assistant

## 2021-09-22 DIAGNOSIS — E11622 Type 2 diabetes mellitus with other skin ulcer: Secondary | ICD-10-CM | POA: Diagnosis not present

## 2021-09-22 NOTE — Progress Notes (Addendum)
KOLSTON, LACOUNT ([id number]) ?Visit Report for 09/22/2021 ?Chief Complaint Document Details ?Patient Name: Jeremy [name], Jeremy [name] ?Date of Service: 09/22/2021 11:45 AM ?Medical Record Number: [id number] ?Patient Account Number: [id number] ?Date of Birth/Sex: 31-Jan-1956 (66 y.o. M) ?Treating RN: Huel Coventry ?Primary Care Provider: Barbette Reichmann Other Clinician: ?Referring Provider: Barbette Reichmann ?Treating Provider/Extender: Allen Derry ?Weeks in Treatment: 2 ?Information Obtained from: Patient ?Chief Complaint ?Right ankle ulcer ?Electronic Signature(s) ?Signed: 09/22/2021 11:59:15 AM By: Lenda Kelp PA-C ?Entered By: Lenda Kelp on 09/22/2021 11:59:15 ?Jeremy [name], Jeremy [name] ([id number]) ?-------------------------------------------------------------------------------- ?HPI Details ?Patient Name: Jeremy [name], Jeremy [name] ?Date of Service: 09/22/2021 11:45 AM ?Medical Record Number: [id number] ?Patient Account Number: [id number] ?Date of Birth/Sex: Dec 27, 1955 (66 y.o. M) ?Treating RN: Huel Coventry ?Primary Care Provider: Barbette Reichmann Other Clinician: ?Referring Provider: Barbette Reichmann ?Treating Provider/Extender: Allen Derry ?Weeks in Treatment: 2 ?History of Present Illness ?HPI Description: 08/18/2020 upon evaluation today patient presents for initial inspection here in our clinic concerning issues has been having with ?his right lateral ankle and this has been present for at least a year he tells me. He is a patient of Dr. Orland Jarred. Subsequently Dr. Orland Jarred has since ?retired hence the patient look this up and is coming here for wound care to see if we can help him out. He did have a screw pushed out of this ?location and since that time tells me that the screw was removed but nonetheless the hole has remained. Fortunately there does not appear to be ?any signs of active infection systemically at this point though it does raise the question as to whether or not this might be a bone/hardware ?infection being that this has  been open for so long. The patient does have a history of diabetes mellitus type 2, coronary artery disease, ?hypertension, and unfortunately does not seem to be doing as great as I would like to see him regard to his left ankle. ?08/25/2020 upon evaluation today patient appears to be doing well with regard to his ankle ulcer compared to last week this is definitely smaller. ?With that being said it still does probe down to bone/hardware. I still think this can be appropriate for him to see the orthopedic specialist. He has ?not heard from them yet he tells me. ?09/05/2020 upon evaluation today patient appears to be doing extremely well in regard to his wound all things considered. I do not see that this is ?gotten any worse. Unfortunately also do not think he is gotten any better. We did make a referral to orthopedics to Dr. Victorino Dike specifically but the ?patient tells me that he has not heard from them. That is unusual as they normally get in touch with patients fairly quickly. I will ask him if he ?possibly missed a phone call he tells me he will check when he gets home. Nonetheless he does not want to go to [facility] anyway which he ?did not tell me previously therefore we will get a see about making a referral to Dr. Logan Bores to see what he potentially could recommend for the ?patient as well I think Dr. Logan Bores is excellent and a good option here. ?Readmission: ?10/28/2020 upon evaluation today patient appears to be doing about the same as when I last saw him. He did not come back for follow-up last ?time I saw him was 09/05/2020. Basically he tells me that he has been attempting to manage this on his own using the Hydrofera Blue rope. He ?did get a call from [facility] but they did not actually get  him scheduled due to the fact that the patient told me that he told them he did ?not wanted to see them he just wanted to come back and see me because he was happy with my care. Nonetheless as I explained to the  patient ?he has a fractured screw at the site of the wound he also has another screw where there appears to be some loosening of the screw and potential ?for hardware/bone infection here. Subsequently I think he really does need to see a specialist to see what they can do to help him out I do not ?believe him to be able to get this healed without taking care of the hardware issues. 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 He has ?been advised previous of a need for an above-knee amputation due to some of the aggressive breakdown in the ankle region. Nonetheless he has ?had the removal of the screw performed in office with Dr. Excell SeltzerBaker and this was on 08/28/2021. Subsequently the patient is a poor historian he also ?gets upset very easily. Of note I do believe that he is going to require some dressings to be packed into the area due to the fact that to be honest ?this is still tracking all the way straight down to bone where there was pus and purulence coming out once I did get down to that area. ?Subsequently he tells me that he is having pain although it does seem to be doing better since the screw was removed. 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 Therefore his best options probably do entail the continue following up by Dr. Sampson GoonFitzgerald for infectious disease coupled with wound care ?as best we can and try to see if we keep this clean as much as possible and keep it from becoming more infected. At some point there may be a ?time where this could continually get worse and may end up not doing nearly as well but right now he does not seem to be doing too poorly and I ?think this is the best way to support him. I am not overly confident that this is ever getting heal  however. ?09/15/21 upon evaluation today patient appears to be doing well with regard to his wound. He has been tolerating the dressing changes without ?complication. Fortunately I do not see any signs of active infection at this time. Overall I think that he is doing quite well. ?09-22-2021 upon evaluation today patient appears to be doing about the same in regard to his ankle. Again this is something that I am not really ?certain is going to heal is not extremely well he does have chronic osteomyelitis. He again has been told that there is probably not a likely ?scenario in which this is able to heal. Nonetheless he is not a HBO candidate due to low ejection fraction. He does see Dr. Sampson GoonFitzgerald he still taking ?antibiotics at this point we will try to keep the area open so does not develop an abscess. ?Electronic Signature(s) ?Signed: 09/22/2021 12:38:52 PM By: Lenda KelpStone III, [place] Behlke PA-C ?Entered By: Lenda KelpStone III, Sidni Fusco on 09/22/2021 12:38:52 ?Sarita BottomROWELL, Jeremy ([id number]) ?-------------------------------------------------------------------------------- ?Physical Exam Details ?Patient Name: Sarita BottomROWELL, Jeremy [name] ?Date of Service: 09/22/2021 11:45 AM ?Medical Record Number: [id number] ?Patient Account Number: [id number] ?Date of Birth/Sex:  1956/02/07 (66 y.o. M) ?Treating RN: Huel Coventry ?Primary Care Provider: Barbette Reichmann Other Clinician: ?Referring Provider: Barbette Reichmann ?Treating Provider/Extender: Allen Derry ?Weeks in Treatment: 2 ?Constitutional ?Well-nourished and well-hydrated in no acute distress. ?Respiratory ?normal breathing without difficulty. ?Psychiatric ?this patient is able to make decisions and demonstrates good insight into disease process. Alert and Oriented x 3. pleasant and cooperative. ?Notes ?Upon inspection patient's wound bed actually showed signs of doing quite well with regard to the fact that we are at least keeping it open. With ?that being said this just does not showing the signs of improvement  that we would like to see in my opinion. He still has a lot of purulent ?drainage. He still on the antibiotics with Dr. Sampson Goon as well. ?Electronic Signature(s) ?Signed: 09/22/2021 12:39:18 PM By: Linwood Dibbles,

## 2021-09-22 NOTE — Progress Notes (Signed)
Jeremy [name], Jeremy [name] ([id number]) ?Visit Report for 09/22/2021 ?Arrival Information Details ?Patient Name: Jeremy [name], Jeremy [name] ?Date of Service: 09/22/2021 11:45 AM ?Medical Record Number: [id number] ?Patient Account Number: [id number] ?Date of Birth/Sex: 04/04/1956 (66 y.o. M) ?Treating RN: Huel Coventry ?Primary Care Kaelani Kendrick: Barbette Reichmann Other Clinician: ?Referring Harlyn Rathmann: Barbette Reichmann ?Treating Opie Maclaughlin/Extender: Allen Derry ?Weeks in Treatment: 2 ?Visit Information History Since Last Visit ?Added or deleted any medications: No ?Patient Arrived: Ambulatory ?Has Dressing in Place as Prescribed: Yes ?Arrival Time: 11:28 ?Pain Present Now: No ?Accompanied By: self ?Transfer Assistance: None ?Patient Identification Verified: Yes ?Secondary Verification Process Completed: Yes ?Patient Requires Transmission-Based No ?Precautions: ?Patient Has Alerts: Yes ?Patient Alerts: Patient on Blood ?Thinner ?Eliquis ?Electronic Signature(s) ?Signed: 09/22/2021 4:59:50 PM By: Elliot Gurney, BSN, RN, CWS, Kim RN, BSN ?Entered By: Elliot Gurney, BSN, RN, CWS, Kim on 09/22/2021 11:47:20 ?[name] ([id number]) ?-------------------------------------------------------------------------------- ?Clinic Level of Care Assessment Details ?Patient Name: Jeremy [name], Jeremy [name] ?Date of Service: 09/22/2021 11:45 AM ?Medical Record Number: [id number] ?Patient Account Number: [id number] ?Date of Birth/Sex: Oct 14, 1955 (66 y.o. M) ?Treating RN: Huel Coventry ?Primary Care Lemario Chaikin: Barbette Reichmann Other Clinician: ?Referring Lorey Pallett: Barbette Reichmann ?Treating Ehren Berisha/Extender: Allen Derry ?Weeks in Treatment: 2 ?Clinic Level of Care Assessment Items ?TOOL 4 Quantity Score ?[]  - Use when only an EandM is performed on FOLLOW-UP visit 0 ?ASSESSMENTS - Nursing Assessment / Reassessment ?X - Reassessment of Co-morbidities (includes updates in patient status) 1 10 ?X- 1 5 ?Reassessment of Adherence to Treatment Plan ?ASSESSMENTS - Wound and Skin Assessment /  Reassessment ?X - Simple Wound Assessment / Reassessment - one wound 1 5 ?[]  - 0 ?Complex Wound Assessment / Reassessment - multiple wounds ?[]  - 0 ?Dermatologic / Skin Assessment (not related to wound area) ?ASSESSMENTS - Focused Assessment ?[]  - Circumferential Edema Measurements - multi extremities 0 ?[]  - 0 ?Nutritional Assessment / Counseling / Intervention ?[]  - 0 ?Lower Extremity Assessment (monofilament, tuning fork, pulses) ?[]  - 0 ?Peripheral Arterial Disease Assessment (using hand held doppler) ?ASSESSMENTS - Ostomy and/or Continence Assessment and Care ?[]  - Incontinence Assessment and Management 0 ?[]  - 0 ?Ostomy Care Assessment and Management (repouching, etc.) ?PROCESS - Coordination of Care ?X - Simple Patient / Family Education for ongoing care 1 15 ?[]  - 0 ?Complex (extensive) Patient / Family Education for ongoing care ?[]  - 0 ?Staff obtains Consents, Records, Test Results / Process Orders ?[]  - 0 ?Staff telephones HHA, Nursing Homes / Clarify orders / etc ?[]  - 0 ?Routine Transfer to another Facility (non-emergent condition) ?[]  - 0 ?Routine Hospital Admission (non-emergent condition) ?[]  - 0 ?New Admissions / / Ordering NPWT, Apligraf, etc. ?[]  - 0 ?Emergency Hospital Admission (emergent condition) ?X- 1 10 ?Simple Discharge Coordination ?[]  - 0 ?Complex (extensive) Discharge Coordination ?PROCESS - Special Needs ?[]  - Pediatric / Minor Patient Management 0 ?[]  - 0 ?Isolation Patient Management ?[]  - 0 ?Hearing / Language / Visual special needs ?[]  - 0 ?Assessment of Community assistance (transportation, D/C planning, etc.) ?[]  - 0 ?Additional assistance / Altered mentation ?[]  - 0 ?Support Surface(s) Assessment (bed, cushion, seat, etc.) ?INTERVENTIONS - Wound Cleansing / Measurement ?Jeremy [name], Jeremy [name] ( ) ?X- 1 5 ?Simple Wound Cleansing - one wound ?[]  - 0 ?Complex Wound Cleansing - multiple wounds ?X- 1 5 ?Wound Imaging (photographs - any number of wounds) ?[]   - 0 ?Wound Tracing (instead of photographs) ?X- 1 5 ?Simple Wound Measurement - one wound ?[]  - 0 ?Complex Wound Measurement - multiple wounds ?INTERVENTIONS - Wound Dressings ?[]  - Small Wound Dressing one  or multiple wounds 0 ?X- 1 15 ?Medium Wound Dressing one or multiple wounds ?[]  - 0 ?Large Wound Dressing one or multiple wounds ?[]  - 0 ?Application of Medications - topical ?[]  - 0 ?Application of Medications - injection ?INTERVENTIONS - Miscellaneous ?[]  - External ear exam 0 ?[]  - 0 ?Specimen Collection (cultures, biopsies, blood, body fluids, etc.) ?[]  - 0 ?Specimen(s) / Culture(s) sent or taken to Lab for analysis ?[]  - 0 ?Patient Transfer (multiple staff / / Similar devices) ?[]  - 0 ?Simple Staple / Suture removal (25 or less) ?[]  - 0 ?Complex Staple / Suture removal (26 or more) ?[]  - 0 ?Hypo / Hyperglycemic Management (close monitor of Blood Glucose) ?[]  - 0 ?Ankle / Brachial Index (ABI) - do not check if billed separately ?X- 1 5 ?Vital Signs ?Has the patient been seen at the hospital within the last three years: Yes ?Total Score: 80 ?Level Of Care: New/Established - Level ?3 ?Electronic Signature(s) ?Signed: 09/22/2021 4:59:50 PM By: , BSN, RN, CWS, Kim RN, BSN ?Entered By: , BSN, RN, CWS, Kim on 09/22/2021 12:18:45 ?Jeremy [name], Jeremy [name] ( ) ?-------------------------------------------------------------------------------- ?Encounter Discharge Information Details ?Patient Name: Jeremy [name], Jeremy [name] ?Date of Service: 09/22/2021 11:45 AM ?Medical Record Number: ?Patient Account Number: ?Date of Birth/Sex: 04/04/1956 (66 y.o. M) ?Treating RN: Elliot Gurney ?Primary Care Naidelyn Parrella: Elliot Gurney Other Clinician: ?Referring Davielle Lingelbach: 11/22/2021 ?Treating Genavie Boettger/Extender: [name] ?Weeks in Treatment: 2 ?Encounter Discharge Information Items ?Discharge Condition: Stable ?Ambulatory Status: Ambulatory ?Discharge Destination: Home ?Transportation: Private  Auto ?Schedule Follow-up Appointment: Yes ?Clinical Summary of Care: ?Electronic Signature(s) ?Signed: 09/22/2021 4:59:50 PM By: [name], BSN, RN, CWS, Kim RN, BSN ?Entered By: 11/22/2021, BSN, RN, CWS, Kim on 09/22/2021 12:20:14 ?Jeremy [name], Jeremy [name] (06/09/1956) ?-------------------------------------------------------------------------------- ?Lower Extremity Assessment Details ?Patient Name: Jeremy [name], Jeremy [name] ?Date of Service: 09/22/2021 11:45 AM ?Medical Record Number: Barbette Reichmann ?Patient Account Number: Barbette Reichmann ?Date of Birth/Sex: [birth year] (66 y.o. M) ?Treating RN: Elliot Gurney ?Primary Care Jo Booze: Elliot Gurney Other Clinician: ?Referring Adriyanna Christians: 11/22/2021 ?Treating Natan Hartog/Extender: [name] ?Weeks in Treatment: 2 ?Edema Assessment ?Assessed: [Left: No] [Right: No] ?[Left: Edema] [Right: :] ?Calf ?Left: Right: ?Point of Measurement: 35 cm From Medial Instep 38 cm ?Ankle ?Left: Right: ?Point of Measurement: 11 cm From Medial Instep 26 cm ?Vascular Assessment ?Pulses: ?Dorsalis Pedis ?Palpable: [Right:Yes] ?Electronic Signature(s) ?Signed: 09/22/2021 4:59:50 PM By: [name], BSN, RN, CWS, Kim RN, BSN ?Entered By: 11/22/2021, BSN, RN, CWS, Kim on 09/22/2021 11:54:01 ?Jeremy [name], Jeremy [name] (06/09/1956) ?-------------------------------------------------------------------------------- ?Multi Wound Chart Details ?Patient Name: Jeremy [name], Jeremy [name] ?Date of Service: 09/22/2021 11:45 AM ?Medical Record Number: Barbette Reichmann ?Patient Account Number: Barbette Reichmann ?Date of Birth/Sex: 06-08-1956 (66 y.o. 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

## 2021-09-29 ENCOUNTER — Encounter: Payer: Medicaid Other | Admitting: Physician Assistant

## 2021-09-29 DIAGNOSIS — E11622 Type 2 diabetes mellitus with other skin ulcer: Secondary | ICD-10-CM | POA: Diagnosis not present

## 2021-09-29 NOTE — Progress Notes (Addendum)
JAMARRIUS, SALAY ([id number]) ?Visit Report for 09/29/2021 ?Arrival Information Details ?Patient Name: Jeremy [name], Jeremy [name] ?Date of Service: 09/29/2021 9:45 AM ?Medical Record Number: [id number] ?Patient Account Number: [id number] ?Date of Birth/Sex: 10/03/1955 (66 y.o. M) ?Treating RN: Angelina Pih ?Primary Care Gao Mitnick: Barbette Reichmann Other Clinician: ?Referring Jerame Hedding: Barbette Reichmann ?Treating Ardenia Stiner/Extender: Allen Derry ?Weeks in Treatment: 3 ?Visit Information History Since Last Visit ?Added or deleted any medications: No ?Patient Arrived: Ambulatory ?Any new allergies or adverse reactions: No ?Arrival Time: 09:45 ?Had a fall or experienced change in No ?Accompanied By: self ?activities of daily living that may affect ?Transfer Assistance: None ?risk of falls: ?Patient Identification Verified: Yes ?Hospitalized since last visit: No ?Secondary Verification Process Completed: Yes ?Has Dressing in Place as Prescribed: Yes ?Patient Requires Transmission-Based No ?Pain Present Now: No ?Precautions: ?Patient Has Alerts: Yes ?Patient Alerts: Patient on Blood ?Thinner ?Eliquis ?Electronic Signature(s) ?Signed: 09/29/2021 4:40:46 PM By: Angelina Pih ?Entered By: Angelina Pih on 09/29/2021 09:54:34 ?Jeremy [name], Jeremy [name] ([id number]) ?-------------------------------------------------------------------------------- ?Clinic Level of Care Assessment Details ?Patient Name: Jeremy [name], Jeremy [name] ?Date of Service: 09/29/2021 9:45 AM ?Medical Record Number: [id number] ?Patient Account Number: [id number] ?Date of Birth/Sex: 1955-10-05 (66 y.o. M) ?Treating RN: Angelina Pih ?Primary Care Macaiah Mangal: Barbette Reichmann Other Clinician: ?Referring Krrish Freund: Barbette Reichmann ?Treating Vasil Juhasz/Extender: Allen Derry ?Weeks in Treatment: 3 ?Clinic Level of Care Assessment Items ?TOOL 4 Quantity Score ?[]  - Use when only an EandM is performed on FOLLOW-UP visit 0 ?ASSESSMENTS - Nursing Assessment / Reassessment ?X - Reassessment of  Co-morbidities (includes updates in patient status) 1 10 ?X- 1 5 ?Reassessment of Adherence to Treatment Plan ?ASSESSMENTS - Wound and Skin Assessment / Reassessment ?X - Simple Wound Assessment / Reassessment - one wound 1 5 ?[]  - 0 ?Complex Wound Assessment / Reassessment - multiple wounds ?[]  - 0 ?Dermatologic / Skin Assessment (not related to wound area) ?ASSESSMENTS - Focused Assessment ?X - Circumferential Edema Measurements - multi extremities 1 5 ?[]  - 0 ?Nutritional Assessment / Counseling / Intervention ?[]  - 0 ?Lower Extremity Assessment (monofilament, tuning fork, pulses) ?[]  - 0 ?Peripheral Arterial Disease Assessment (using hand held doppler) ?ASSESSMENTS - Ostomy and/or Continence Assessment and Care ?[]  - Incontinence Assessment and Management 0 ?[]  - 0 ?Ostomy Care Assessment and Management (repouching, etc.) ?PROCESS - Coordination of Care ?X - Simple Patient / Family Education for ongoing care 1 15 ?[]  - 0 ?Complex (extensive) Patient / Family Education for ongoing care ?[]  - 0 ?Staff obtains Consents, Records, Test Results / Process Orders ?[]  - 0 ?Staff telephones HHA, Nursing Homes / Clarify orders / etc ?[]  - 0 ?Routine Transfer to another Facility (non-emergent condition) ?[]  - 0 ?Routine Hospital Admission (non-emergent condition) ?[]  - 0 ?New Admissions / / Ordering NPWT, Apligraf, etc. ?[]  - 0 ?Emergency Hospital Admission (emergent condition) ?X- 1 10 ?Simple Discharge Coordination ?[]  - 0 ?Complex (extensive) Discharge Coordination ?PROCESS - Special Needs ?[]  - Pediatric / Minor Patient Management 0 ?[]  - 0 ?Isolation Patient Management ?[]  - 0 ?Hearing / Language / Visual special needs ?[]  - 0 ?Assessment of Community assistance (transportation, D/C planning, etc.) ?[]  - 0 ?Additional assistance / Altered mentation ?[]  - 0 ?Support Surface(s) Assessment (bed, cushion, seat, etc.) ?INTERVENTIONS - Wound Cleansing / Measurement ?Jeremy [name], Jeremy [name] ( ) ?X- 1  5 ?Simple Wound Cleansing - one wound ?[]  - 0 ?Complex Wound Cleansing - multiple wounds ?X- 1 5 ?Wound Imaging (photographs - any number of wounds) ?[]  - 0 ?Wound Tracing (instead of photographs) ?X- 1 5 ?Simple Wound  Measurement - one wound ?[]  - 0 ?Complex Wound Measurement - multiple wounds ?INTERVENTIONS - Wound Dressings ?X - Small Wound Dressing one or multiple wounds 1 10 ?[]  - 0 ?Medium Wound Dressing one or multiple wounds ?[]  - 0 ?Large Wound Dressing one or multiple wounds ?X- 1 5 ?Application of Medications - topical ?[]  - 0 ?Application of Medications - injection ?INTERVENTIONS - Miscellaneous ?[]  - External ear exam 0 ?[]  - 0 ?Specimen Collection (cultures, biopsies, blood, body fluids, etc.) ?[]  - 0 ?Specimen(s) / Culture(s) sent or taken to Lab for analysis ?[]  - 0 ?Patient Transfer (multiple staff / / Similar devices) ?[]  - 0 ?Simple Staple / Suture removal (25 or less) ?[]  - 0 ?Complex Staple / Suture removal (26 or more) ?[]  - 0 ?Hypo / Hyperglycemic Management (close monitor of Blood Glucose) ?[]  - 0 ?Ankle / Brachial Index (ABI) - do not check if billed separately ?X- 1 5 ?Vital Signs ?Has the patient been seen at the hospital within the last three years: Yes ?Total Score: 85 ?Level Of Care: New/Established - Level ?3 ?Electronic Signature(s) ?Signed: 09/29/2021 4:40:46 PM By: ?Entered By: on 09/29/2021 10:17:36 ?Jeremy [name], Jeremy [name] ( ) ?-------------------------------------------------------------------------------- ?Encounter Discharge Information Details ?Patient Name: Jeremy [name], Jeremy [name] ?Date of Service: 09/29/2021 9:45 AM ?Medical Record Number: ?Patient Account Number: ?Date of Birth/Sex: Oct 15, 1955 (66 y.o. M) ?Treating RN: 10/01/2021 ?Primary Care Carra Brindley: Angelina Pih Other Clinician: ?Referring Emmalena Canny: Angelina Pih ?Treating Daishon Chui/Extender: 10/01/2021 ?Weeks in Treatment: 3 ?Encounter Discharge  Information Items ?Discharge Condition: Stable ?Ambulatory Status: Ambulatory ?Discharge Destination: Home ?Transportation: Private Auto ?Accompanied By: self ?Schedule Follow-up Appointment: Yes ?Clinical Summary of Care: Patient Declined ?Electronic Signature(s) ?Signed: 09/29/2021 4:40:46 PM By: [id number] ?Entered By: [name] on 09/29/2021 10:18:50 ?Jeremy [name], Jeremy [name] ([id number]) ?-------------------------------------------------------------------------------- ?Lower Extremity Assessment Details ?Patient Name: Jeremy [name], Jeremy [name] ?Date of Service: 09/29/2021 9:45 AM ?Medical Record Number: Angelina Pih ?Patient Account Number: Barbette Reichmann ?Date of Birth/Sex: 09/25/1955 (66 y.o. M) ?Treating RN: 10/01/2021 ?Primary Care Soloman Mckeithan: Angelina Pih Other Clinician: ?Referring Arasely Akkerman: Angelina Pih ?Treating Emaree Chiu/Extender: 10/01/2021 ?Weeks in Treatment: 3 ?Edema Assessment ?Assessed: [Left: No] [Right: No] ?Edema: [Left: Ye] [Right: s] ?Calf ?Left: Right: ?Point of Measurement: 35 cm From Medial Instep 37 cm ?Ankle ?Left: Right: ?Point of Measurement: 11 cm From Medial Instep 26.2 cm ?Vascular Assessment ?Pulses: ?Dorsalis Pedis ?Palpable: [Right:Yes] ?Electronic Signature(s) ?Signed: 09/29/2021 4:40:46 PM By: [id number] ?Entered By: [name] on 09/29/2021 10:02:07 ?Jeremy [name], Jeremy [name] ([id number]) ?-------------------------------------------------------------------------------- ?Multi Wound Chart Details ?Patient Name: Jeremy [name], Jeremy [name] ?Date of Service: 09/29/2021 9:45 AM ?Medical Record Number: Angelina Pih ?Patient Account Number: Barbette Reichmann ?Date of Birth/Sex: 06-Dec-1955 (66 y.o. M) ?Treating RN: 10/01/2021 ?Primary Care Kaulder Zahner: Angelina Pih Other Clinician: ?Referring Adilee Lemme: Angelina Pih ?Treating Micharl Helmes/Extender: 10/01/2021 ?Weeks in Treatment: 3 ?Vital Signs ?Height(in): 71 ?Pulse(bpm): 79 ?Weight(lbs): 250 ?Blood Pressure(mmHg): 132/84 ?Body Mass Index(BMI):  34.9 ?Temperature(??F): 97.5 ?Respiratory Rate(breaths/min): 18 ?Photos: [N/A:N/A] ?Wound Location: Right, Lateral Ankle N/A N/A ?Wounding Event: Gradually Appeared N/A N/A ?Primary Etiology: Refractory Osteomyelit

## 2021-09-29 NOTE — Progress Notes (Addendum)
MEMPHIS, DECOTEAU ([id number]) ?Visit Report for 09/29/2021 ?Chief Complaint Document Details ?Patient Name: Jeremy [name], Jeremy [name] ?Date of Service: 09/29/2021 9:45 AM ?Medical Record Number: [id number] ?Patient Account Number: [id number] ?Date of Birth/Sex: August 15, 1955 (66 y.o. M) ?Treating RN: Angelina Pih ?Primary Care Provider: Barbette Reichmann Other Clinician: ?Referring Provider: Barbette Reichmann ?Treating Provider/Extender: Allen Derry ?Weeks in Treatment: 3 ?Information Obtained from: Patient ?Chief Complaint ?Right ankle ulcer ?Electronic Signature(s) ?Signed: 09/29/2021 9:49:45 AM By: Lenda Kelp PA-C ?Entered By: Lenda Kelp on 09/29/2021 09:49:45 ?Jeremy [name], Jeremy [name] ([id number]) ?-------------------------------------------------------------------------------- ?HPI Details ?Patient Name: Jeremy [name], Jeremy [name] ?Date of Service: 09/29/2021 9:45 AM ?Medical Record Number: [id number] ?Patient Account Number: [id number] ?Date of Birth/Sex: 11-09-1955 (66 y.o. M) ?Treating RN: Angelina Pih ?Primary Care Provider: Barbette Reichmann Other Clinician: ?Referring Provider: Barbette Reichmann ?Treating Provider/Extender: Allen Derry ?Weeks in Treatment: 3 ?History of Present Illness ?HPI Description: 08/18/2020 upon evaluation today patient presents for initial inspection here in our clinic concerning issues has been having with ?his right lateral ankle and this has been present for at least a year he tells me. He is a patient of Dr. Orland Jarred. Subsequently Dr. Orland Jarred has since ?retired hence the patient look this up and is coming here for wound care to see if we can help him out. He did have a screw pushed out of this ?location and since that time tells me that the screw was removed but nonetheless the hole has remained. Fortunately there does not appear to be ?any signs of active infection systemically at this point though it does raise the question as to whether or not this might be a bone/hardware ?infection being that this  has been open for so long. The patient does have a history of diabetes mellitus type 2, coronary artery disease, ?hypertension, and unfortunately does not seem to be doing as great as I would like to see him regard to his left ankle. ?08/25/2020 upon evaluation today patient appears to be doing well with regard to his ankle ulcer compared to last week this is definitely smaller. ?With that being said it still does probe down to bone/hardware. I still think this can be appropriate for him to see the orthopedic specialist. He has ?not heard from them yet he tells me. ?09/05/2020 upon evaluation today patient appears to be doing extremely well in regard to his wound all things considered. I do not see that this is ?gotten any worse. Unfortunately also do not think he is gotten any better. We did make a referral to orthopedics to Dr. Victorino Dike specifically but the ?patient tells me that he has not heard from them. That is unusual as they normally get in touch with patients fairly quickly. I will ask him if he ?possibly missed a phone call he tells me he will check when he gets home. Nonetheless he does not want to go to [facility] anyway which he ?did not tell me previously therefore we will get a see about making a referral to Dr. Logan Bores to see what he potentially could recommend for the ?patient as well I think Dr. Logan Bores is excellent and a good option here. ?Readmission: ?10/28/2020 upon evaluation today patient appears to be doing about the same as when I last saw him. He did not come back for follow-up last ?time I saw him was 09/05/2020. Basically he tells me that he has been attempting to manage this on his own using the Hydrofera Blue rope. He ?did get a call from [facility] but they did not actually get  him scheduled due to the fact that the patient told me that he told them he did ?not wanted to see them he just wanted to come back and see me because he was happy with my care. Nonetheless as I explained to the  patient ?he has a fractured screw at the site of the wound he also has another screw where there appears to be some loosening of the screw and potential ?for hardware/bone infection here. Subsequently I think he really does need to see a specialist to see what they can do to help him out I do not ?believe him to be able to get this healed without taking care of the hardware issues. 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 He has ?been advised previous of a need for an above-knee amputation due to some of the aggressive breakdown in the ankle region. Nonetheless he has ?had the removal of the screw performed in office with Dr. Luana Shu and this was on 08/28/2021. Subsequently the patient is a poor historian he also ?gets upset very easily. Of note I do believe that he is going to require some dressings to be packed into the area due to the fact that to be honest ?this is still tracking all the way straight down to bone where there was pus and purulence coming out once I did get down to that area. ?Subsequently he tells me that he is having pain although it does seem to be doing better since the screw was removed. 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 Therefore his best options probably do entail the continue following up by Dr. Ola Spurr for infectious disease coupled with wound care ?as best we can and try to see if we keep this clean as much as possible and keep it from becoming more infected. At some point there may be a ?time where this could continually get worse and may end up not doing nearly as well but right now he does not seem to be doing too poorly and I ?think this is the best way to support him. I am not overly confident that this is ever getting heal  however. ?09/15/21 upon evaluation today patient appears to be doing well with regard to his wound. He has been tolerating the dressing changes without ?complication. Fortunately I do not see any signs of active infection at this time. Overall I think that he is doing quite well. ?09-22-2021 upon evaluation today patient appears to be doing about the same in regard to his ankle. Again this is something that I am not really ?certain is going to heal is not extremely well he does have chronic osteomyelitis. He again has been told that there is probably not a likely ?scenario in which this is able to heal. Nonetheless he is not a HBO candidate due to low ejection fraction. He does see Dr. Ola Spurr he still taking ?antibiotics at this point we will try to keep the area open so does not develop an abscess. ?09-29-2021 upon evaluation today patient appears to be doing well with regard to his wound all things considered. He continues to use the ?Hydrofera Blue rope. This is still a fairly deep wound that I think the rope is doing well to help keep the drainage from  collecting in the base of the ?wound there does appear to be less drainage definitely not as purulent as what we have noticed in the past. There is no increased pain and no ?signs of worsening in general. ?Electronic Signature(s) ?Signed: 09/29/2021 10:16:22 AM By: Lenda Kelp PA-C ?Entered By: Lenda Kelp on 09/29/2021 10:16:21 ?Jeremy [name] ([id number]) ?-------------------------------------------------------------------------------- ?Physical Exam Details ?Patient Name: Jeremy [name], Jeremy [name] ?Date of Service: 09/29/2021 9:45 AM ?Medical Record Number: [id number] ?Patient Account Number: [id number] ?Date of Birth/Sex: 1955/07/11 (66 y.o. M) ?Treating RN: Angelina Pih ?Primary Care Provider: Barbette Reichmann Other Clinician: ?Referring Provider: Barbette Reichmann ?Treating Provider/Extender: Allen Derry ?Weeks in Treatment: 3 ?Constitutional ?Well-nourished  and well-hydrated in no acute distress. ?Respiratory ?normal breathing without difficulty. ?Psychiatric ?this patient is able to make decisions and demonstrates good insight into disease process. Alert

## 2021-10-06 ENCOUNTER — Encounter: Payer: Medicaid Other | Admitting: Physician Assistant

## 2021-10-06 DIAGNOSIS — E11622 Type 2 diabetes mellitus with other skin ulcer: Secondary | ICD-10-CM | POA: Diagnosis not present

## 2021-10-07 NOTE — Progress Notes (Addendum)
JAXSON, WEISMAN ([id number]) ?Visit Report for 10/06/2021 ?Chief Complaint Document Details ?Patient Name: GRADIN, KASPAR ?Date of Service: 10/06/2021 2:45 PM ?Medical Record Number: [id number] ?Patient Account Number: [id number] ?Date of Birth/Sex: Nov 30, 1955 (66 y.o. M) ?Treating RN: Huel Coventry ?Primary Care Provider: Barbette Reichmann Other Clinician: ?Referring Provider: Barbette Reichmann ?Treating Provider/Extender: Allen Derry ?Weeks in Treatment: 4 ?Information Obtained from: Patient ?Chief Complaint ?Right ankle ulcer ?Electronic Signature(s) ?Signed: 10/06/2021 2:58:25 PM By: Lenda Kelp PA-C ?Entered By: Lenda Kelp on 10/06/2021 14:58:24 ?LA, CABREROS ([id number]) ?-------------------------------------------------------------------------------- ?HPI Details ?Patient Name: YOCHANON, BRACKNELL ?Date of Service: 10/06/2021 2:45 PM ?Medical Record Number: [id number] ?Patient Account Number: [id number] ?Date of Birth/Sex: 1956-04-25 (66 y.o. M) ?Treating RN: Huel Coventry ?Primary Care Provider: Barbette Reichmann Other Clinician: ?Referring Provider: Barbette Reichmann ?Treating Provider/Extender: Allen Derry ?Weeks in Treatment: 4 ?History of Present Illness ?HPI Description: 08/18/2020 upon evaluation today patient presents for initial inspection here in our clinic concerning issues has been having with ?his right lateral ankle and this has been present for at least a year he tells me. He is a patient of Dr. Orland Jarred. Subsequently Dr. Orland Jarred has since ?retired hence the patient look this up and is coming here for wound care to see if we can help him out. He did have a screw pushed out of this ?location and since that time tells me that the screw was removed but nonetheless the hole has remained. Fortunately there does not appear to be ?any signs of active infection systemically at this point though it does raise the question as to whether or not this might be a bone/hardware ?infection being that this has been  open for so long. The patient does have a history of diabetes mellitus type 2, coronary artery disease, ?hypertension, and unfortunately does not seem to be doing as great as I would like to see him regard to his left ankle. ?08/25/2020 upon evaluation today patient appears to be doing well with regard to his ankle ulcer compared to last week this is definitely smaller. ?With that being said it still does probe down to bone/hardware. I still think this can be appropriate for him to see the orthopedic specialist. He has ?not heard from them yet he tells me. ?09/05/2020 upon evaluation today patient appears to be doing extremely well in regard to his wound all things considered. I do not see that this is ?gotten any worse. Unfortunately also do not think he is gotten any better. We did make a referral to orthopedics to Dr. Victorino Dike specifically but the ?patient tells me that he has not heard from them. That is unusual as they normally get in touch with patients fairly quickly. I will ask him if he ?possibly missed a phone call he tells me he will check when he gets home. Nonetheless he does not want to go to [facility] anyway which he ?did not tell me previously therefore we will get a see about making a referral to Dr. Logan Bores to see what he potentially could recommend for the ?patient as well I think Dr. Logan Bores is excellent and a good option here. ?Readmission: ?10/28/2020 upon evaluation today patient appears to be doing about the same as when I last saw him. He did not come back for follow-up last ?time I saw him was 09/05/2020. Basically he tells me that he has been attempting to manage this on his own using the Hydrofera Blue rope. He ?did get a call from [facility] but they did not actually get  him scheduled due to the fact that the patient told me that he told them he did ?not wanted to see them he just wanted to come back and see me because he was happy with my care. Nonetheless as I explained to the  patient ?he has a fractured screw at the site of the wound he also has another screw where there appears to be some loosening of the screw and potential ?for hardware/bone infection here. Subsequently I think he really does need to see a specialist to see what they can do to help him out I do not ?believe him to be able to get this healed without taking care of the hardware issues. 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 He has ?been advised previous of a need for an above-knee amputation due to some of the aggressive breakdown in the ankle region. Nonetheless he has ?had the removal of the screw performed in office with Dr. Luana Shu and this was on 08/28/2021. Subsequently the patient is a poor historian he also ?gets upset very easily. Of note I do believe that he is going to require some dressings to be packed into the area due to the fact that to be honest ?this is still tracking all the way straight down to bone where there was pus and purulence coming out once I did get down to that area. ?Subsequently he tells me that he is having pain although it does seem to be doing better since the screw was removed. 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 Therefore his best options probably do entail the continue following up by Dr. Ola Spurr for infectious disease coupled with wound care ?as best we can and try to see if we keep this clean as much as possible and keep it from becoming more infected. At some point there may be a ?time where this could continually get worse and may end up not doing nearly as well but right now he does not seem to be doing too poorly and I ?think this is the best way to support him. I am not overly confident that this is ever getting heal  however. ?09/15/21 upon evaluation today patient appears to be doing well with regard to his wound. He has been tolerating the dressing changes without ?complication. Fortunately I do not see any signs of active infection at this time. Overall I think that he is doing quite well. ?09-22-2021 upon evaluation today patient appears to be doing about the same in regard to his ankle. Again this is something that I am not really ?certain is going to heal is not extremely well he does have chronic osteomyelitis. He again has been told that there is probably not a likely ?scenario in which this is able to heal. Nonetheless he is not a HBO candidate due to low ejection fraction. He does see Dr. Ola Spurr he still taking ?antibiotics at this point we will try to keep the area open so does not develop an abscess. ?09-29-2021 upon evaluation today patient appears to be doing well with regard to his wound all things considered. He continues to use the ?Hydrofera Blue rope. This is still a fairly deep wound that I think the rope is doing well to help keep the drainage from  collecting in the base of the ?wound there does appear to be less drainage definitely not as purulent as what we have noticed in the past. There is no increased pain and no ?signs of worsening in general. ?10-06-2021 upon evaluation today patient's wound is continuing to show signs of issues here at this point. Fortunately I do not see any evidence of ?active infection locally or systemically which is great news but at the same time he is definitely having some ongoing problems here currently. ?Electronic Signature(s) ?Signed: 10/06/2021 5:32:20 PM By: Worthy Keeler PA-C ?Entered By: Worthy Keeler on 10/06/2021 17:32:20 ?[name] ([id number]) ?ERNIE, HALAS ([id number]) ?-------------------------------------------------------------------------------- ?Physical Exam Details ?Patient Name: DEAMONTAE, ROUSER ?Date of Service: 10/06/2021 2:45 PM ?Medical  Record Number: [id number] ?Patient Account Number: [id number] ?Date of Birth/Sex: 1956-01-09 (66 y.o. M) ?Treating RN: Cornell Barman ?Primary Care Provider: Tracie Harrier Other Clinician: ?Referring Provider: Roetta Sessions

## 2021-10-07 NOTE — Progress Notes (Signed)
Jeremy [name] ([id number]) ?Visit Report for 10/06/2021 ?Arrival Information Details ?Patient Name: Jeremy [name], Jeremy [name] ?Date of Service: 10/06/2021 2:45 PM ?Medical Record Number: [id number] ?Patient Account Number: [id number] ?Date of Birth/Sex: 1955/12/13 (66 y.o. M) ?Treating RN: Huel Coventry ?Primary Care Katniss Weedman: Barbette Reichmann Other Clinician: ?Referring Vinayak Bobier: Barbette Reichmann ?Treating Luqman Perrelli/Extender: Allen Derry ?Weeks in Treatment: 4 ?Visit Information History Since Last Visit ?Added or deleted any medications: No ?Patient Arrived: Ambulatory ?Has Dressing in Place as Prescribed: Yes ?Arrival Time: 14:39 ?Pain Present Now: Yes ?Accompanied By: self ?Transfer Assistance: None ?Patient Identification Verified: Yes ?Secondary Verification Process Completed: Yes ?Patient Requires Transmission-Based No ?Precautions: ?Patient Has Alerts: Yes ?Patient Alerts: Patient on Blood ?Thinner ?Eliquis ?Electronic Signature(s) ?Signed: 10/06/2021 3:52:54 PM By: Elliot Gurney, BSN, RN, CWS, Kim RN, BSN ?Entered By: Elliot Gurney, BSN, RN, CWS, Kim on 10/06/2021 14:41:01 ?Jeremy [name], Jeremy [name] ([id number]) ?-------------------------------------------------------------------------------- ?Clinic Level of Care Assessment Details ?Patient Name: Jeremy [name] ?Date of Service: 10/06/2021 2:45 PM ?Medical Record Number: [id number] ?Patient Account Number: [id number] ?Date of Birth/Sex: [birth year] (66 y.o. M) ?Treating RN: Huel Coventry ?Primary Care Kai Calico: Barbette Reichmann Other Clinician: ?Referring Trevaris Pennella: Barbette Reichmann ?Treating Suzzanne Brunkhorst/Extender: Allen Derry ?Weeks in Treatment: 4 ?Clinic Level of Care Assessment Items ?TOOL 4 Quantity Score ?[]  - Use when only an EandM is performed on FOLLOW-UP visit 0 ?ASSESSMENTS - Nursing Assessment / Reassessment ?X - Reassessment of Co-morbidities (includes updates in patient status) 1 10 ?X- 1 5 ?Reassessment of Adherence to Treatment Plan ?ASSESSMENTS - Wound and Skin Assessment /  Reassessment ?X - Simple Wound Assessment / Reassessment - one wound 1 5 ?[]  - 0 ?Complex Wound Assessment / Reassessment - multiple wounds ?[]  - 0 ?Dermatologic / Skin Assessment (not related to wound area) ?ASSESSMENTS - Focused Assessment ?[]  - Circumferential Edema Measurements - multi extremities 0 ?[]  - 0 ?Nutritional Assessment / Counseling / Intervention ?[]  - 0 ?Lower Extremity Assessment (monofilament, tuning fork, pulses) ?[]  - 0 ?Peripheral Arterial Disease Assessment (using hand held doppler) ?ASSESSMENTS - Ostomy and/or Continence Assessment and Care ?[]  - Incontinence Assessment and Management 0 ?[]  - 0 ?Ostomy Care Assessment and Management (repouching, etc.) ?PROCESS - Coordination of Care ?X - Simple Patient / Family Education for ongoing care 1 15 ?[]  - 0 ?Complex (extensive) Patient / Family Education for ongoing care ?X- 1 10 ?Staff obtains Consents, Records, Test Results / Process Orders ?[]  - 0 ?Staff telephones HHA, Nursing Homes / Clarify orders / etc ?[]  - 0 ?Routine Transfer to another Facility (non-emergent condition) ?[]  - 0 ?Routine Hospital Admission (non-emergent condition) ?[]  - 0 ?New Admissions / / Ordering NPWT, Apligraf, etc. ?[]  - 0 ?Emergency Hospital Admission (emergent condition) ?X- 1 10 ?Simple Discharge Coordination ?[]  - 0 ?Complex (extensive) Discharge Coordination ?PROCESS - Special Needs ?[]  - Pediatric / Minor Patient Management 0 ?[]  - 0 ?Isolation Patient Management ?[]  - 0 ?Hearing / Language / Visual special needs ?[]  - 0 ?Assessment of Community assistance (transportation, D/C planning, etc.) ?[]  - 0 ?Additional assistance / Altered mentation ?[]  - 0 ?Support Surface(s) Assessment (bed, cushion, seat, etc.) ?INTERVENTIONS - Wound Cleansing / Measurement ?Jeremy [name] ( ) ?X- 1 5 ?Simple Wound Cleansing - one wound ?[]  - 0 ?Complex Wound Cleansing - multiple wounds ?X- 1 5 ?Wound Imaging (photographs - any number of wounds) ?[]   - 0 ?Wound Tracing (instead of photographs) ?X- 1 5 ?Simple Wound Measurement - one wound ?[]  - 0 ?Complex Wound Measurement - multiple wounds ?INTERVENTIONS - Wound Dressings ?[]  - Small Wound Dressing one  or multiple wounds 0 ?X- 1 15 ?Medium Wound Dressing one or multiple wounds ?[]  - 0 ?Large Wound Dressing one or multiple wounds ?[]  - 0 ?Application of Medications - topical ?[]  - 0 ?Application of Medications - injection ?INTERVENTIONS - Miscellaneous ?[]  - External ear exam 0 ?[]  - 0 ?Specimen Collection (cultures, biopsies, blood, body fluids, etc.) ?[]  - 0 ?Specimen(s) / Culture(s) sent or taken to Lab for analysis ?[]  - 0 ?Patient Transfer (multiple staff / / Similar devices) ?[]  - 0 ?Simple Staple / Suture removal (25 or less) ?[]  - 0 ?Complex Staple / Suture removal (26 or more) ?[]  - 0 ?Hypo / Hyperglycemic Management (close monitor of Blood Glucose) ?[]  - 0 ?Ankle / Brachial Index (ABI) - do not check if billed separately ?X- 1 5 ?Vital Signs ?Has the patient been seen at the hospital within the last three years: Yes ?Total Score: 90 ?Level Of Care: New/Established - Level ?3 ?Electronic Signature(s) ?Signed: 10/06/2021 3:52:54 PM By: , BSN, RN, CWS, Kim RN, BSN ?Entered By: , BSN, RN, CWS, Kim on 10/06/2021 15:18:10 ? ( ) ?-------------------------------------------------------------------------------- ?Encounter Discharge Information Details ?Patient Name: Jeremy [name], Jeremy [name] ?Date of Service: 10/06/2021 2:45 PM ?Medical Record Number: ?Patient Account Number: ?Date of Birth/Sex: [birth year] (66 y.o. M) ?Treating RN: Elliot Gurney ?Primary Care Cosmo Tetreault: Elliot Gurney Other Clinician: ?Referring Julietta Batterman: 10/08/2021 ?Treating Dondrell Loudermilk/Extender: [name] ?Weeks in Treatment: 4 ?Encounter Discharge Information Items ?Discharge Condition: Stable ?Ambulatory Status: Ambulatory ?Discharge Destination: Home ?Transportation: Private  Auto ?Accompanied By: self ?Schedule Follow-up Appointment: Yes ?Clinical Summary of Care: ?Electronic Signature(s) ?Signed: 10/06/2021 3:52:15 PM By: [name], BSN, RN, CWS, Kim RN, BSN ?Entered By: 10/08/2021, BSN, RN, CWS, Kim on 10/06/2021 15:52:14 ?Jeremy [name], Jeremy [name] (06/09/1956) ?-------------------------------------------------------------------------------- ?Lower Extremity Assessment Details ?Patient Name: Jeremy [name], Jeremy [name] ?Date of Service: 10/06/2021 2:45 PM ?Medical Record Number: Barbette Reichmann ?Patient Account Number: Barbette Reichmann ?Date of Birth/Sex: Mar 31, 1956 (66 y.o. M) ?Treating RN: Elliot Gurney ?Primary Care Chadrick Sprinkle: Elliot Gurney Other Clinician: ?Referring Tahira Olivarez: 10/08/2021 ?Treating Nayah Lukens/Extender: [name] ?Weeks in Treatment: 4 ?Edema Assessment ?Assessed: [Left: No] [Right: Yes] ?[Left: Edema] [Right: :] ?Vascular Assessment ?Pulses: ?Dorsalis Pedis ?Palpable: [Right:Yes] ?Electronic Signature(s) ?Signed: 10/06/2021 3:52:54 PM By: [name], BSN, RN, CWS, Kim RN, BSN ?Entered By: 10/08/2021, BSN, RN, CWS, Kim on 10/06/2021 14:46:33 ?[id number] (06/09/1956) ?-------------------------------------------------------------------------------- ?Multi Wound Chart Details ?Patient Name: Jeremy [name], Jeremy [name] ?Date of Service: 10/06/2021 2:45 PM ?Medical Record Number: Barbette Reichmann ?Patient Account Number: Barbette Reichmann ?Date of Birth/Sex: 03-May-1956 (66 y.o. 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

## 2021-10-19 ENCOUNTER — Encounter: Payer: Medicaid Other | Attending: Internal Medicine | Admitting: Internal Medicine

## 2021-10-19 DIAGNOSIS — I2510 Atherosclerotic heart disease of native coronary artery without angina pectoris: Secondary | ICD-10-CM | POA: Diagnosis not present

## 2021-10-19 DIAGNOSIS — M86471 Chronic osteomyelitis with draining sinus, right ankle and foot: Secondary | ICD-10-CM | POA: Insufficient documentation

## 2021-10-19 DIAGNOSIS — E1169 Type 2 diabetes mellitus with other specified complication: Secondary | ICD-10-CM | POA: Diagnosis not present

## 2021-10-19 DIAGNOSIS — I10 Essential (primary) hypertension: Secondary | ICD-10-CM | POA: Diagnosis not present

## 2021-10-19 DIAGNOSIS — E11622 Type 2 diabetes mellitus with other skin ulcer: Secondary | ICD-10-CM | POA: Insufficient documentation

## 2021-10-19 DIAGNOSIS — L97316 Non-pressure chronic ulcer of right ankle with bone involvement without evidence of necrosis: Secondary | ICD-10-CM | POA: Insufficient documentation

## 2021-10-19 NOTE — Progress Notes (Signed)
Jeremy [name] ([id number]) ?Visit Report for 10/19/2021 ?Arrival Information Details ?Patient Name: Jeremy [name], Jeremy [name] ?Date of Service: 10/19/2021 9:45 AM ?Medical Record Number: [id number] ?Patient Account Number: [id number] ?Date of Birth/Sex: 1956-03-06 (66 y.o. M) ?Treating RN: Cornell Barman ?Primary Care Alexsis Branscom: Tracie Harrier Other Clinician: ?Referring Zeppelin Beckstrand: Tracie Harrier ?Treating Tamberlyn Midgley/Extender: Ricard Dillon ?Weeks in Treatment: 6 ?Visit Information History Since Last Visit ?Added or deleted any medications: No ?Patient Arrived: Ambulatory ?Has Dressing in Place as Prescribed: Yes ?Arrival Time: 09:37 ?Pain Present Now: No ?Accompanied By: self ?Transfer Assistance: None ?Patient Identification Verified: Yes ?Secondary Verification Process Completed: Yes ?Patient Requires Transmission-Based No ?Precautions: ?Patient Has Alerts: Yes ?Patient Alerts: Patient on Blood ?Thinner ?Eliquis ?Electronic Signature(s) ?Signed: 10/19/2021 4:28:03 PM By: Gretta Cool, BSN, RN, CWS, Kim RN, BSN ?Entered By: Gretta Cool, BSN, RN, CWS, Kim on 10/19/2021 09:43:52 ?Jeremy [name] ([id number]) ?-------------------------------------------------------------------------------- ?Clinic Level of Care Assessment Details ?Patient Name: Jeremy [name], Jeremy [name] ?Date of Service: 10/19/2021 9:45 AM ?Medical Record Number: [id number] ?Patient Account Number: [id number] ?Date of Birth/Sex: 1956/03/02 (66 y.o. M) ?Treating RN: Cornell Barman ?Primary Care Tanishia Lemaster: Tracie Harrier Other Clinician: ?Referring Camala Talwar: Tracie Harrier ?Treating Inioluwa Baris/Extender: Ricard Dillon ?Weeks in Treatment: 6 ?Clinic Level of Care Assessment Items ?TOOL 4 Quantity Score ?[]  - Use when only an EandM is performed on FOLLOW-UP visit 0 ?ASSESSMENTS - Nursing Assessment / Reassessment ?X - Reassessment of Co-morbidities (includes updates in patient status) 1 10 ?X- 1 5 ?Reassessment of Adherence to Treatment Plan ?ASSESSMENTS - Wound and Skin Assessment /  Reassessment ?X - Simple Wound Assessment / Reassessment - one wound 1 5 ?[]  - 0 ?Complex Wound Assessment / Reassessment - multiple wounds ?[]  - 0 ?Dermatologic / Skin Assessment (not related to wound area) ?ASSESSMENTS - Focused Assessment ?[]  - Circumferential Edema Measurements - multi extremities 0 ?[]  - 0 ?Nutritional Assessment / Counseling / Intervention ?X- 1 5 ?Lower Extremity Assessment (monofilament, tuning fork, pulses) ?[]  - 0 ?Peripheral Arterial Disease Assessment (using hand held doppler) ?ASSESSMENTS - Ostomy and/or Continence Assessment and Care ?[]  - Incontinence Assessment and Management 0 ?[]  - 0 ?Ostomy Care Assessment and Management (repouching, etc.) ?PROCESS - Coordination of Care ?X - Simple Patient / Family Education for ongoing care 1 15 ?[]  - 0 ?Complex (extensive) Patient / Family Education for ongoing care ?X- 1 10 ?Staff obtains Consents, Records, Test Results / Process Orders ?[]  - 0 ?Staff telephones [place], Nursing Homes / Clarify orders / etc ?[]  - 0 ?Routine Transfer to another Facility (non-emergent condition) ?[]  - 0 ?Routine Hospital Admission (non-emergent condition) ?[]  - 0 ?New Admissions / Biomedical engineer / Ordering NPWT, Apligraf, etc. ?[]  - 0 ?Emergency Hospital Admission (emergent condition) ?X- 1 10 ?Simple Discharge Coordination ?[]  - 0 ?Complex (extensive) Discharge Coordination ?PROCESS - Special Needs ?[]  - Pediatric / Minor Patient Management 0 ?[]  - 0 ?Isolation Patient Management ?[]  - 0 ?Hearing / Language / Visual special needs ?[]  - 0 ?Assessment of Community assistance (transportation, D/C planning, etc.) ?[]  - 0 ?Additional assistance / Altered mentation ?[]  - 0 ?Support Surface(s) Assessment (bed, cushion, seat, etc.) ?INTERVENTIONS - Wound Cleansing / Measurement ?Jeremy [name], Jeremy [name] ([id number]) ?X- 1 5 ?Simple Wound Cleansing - one wound ?[]  - 0 ?Complex Wound Cleansing - multiple wounds ?X- 1 5 ?Wound Imaging (photographs - any number of wounds) ?[]   - 0 ?Wound Tracing (instead of photographs) ?X- 1 5 ?Simple Wound Measurement - one wound ?[]  - 0 ?Complex Wound Measurement - multiple wounds ?INTERVENTIONS - Wound Dressings ?[]  - Small Wound  Dressing one or multiple wounds 0 ?X- 1 15 ?Medium Wound Dressing one or multiple wounds ?[]  - 0 ?Large Wound Dressing one or multiple wounds ?[]  - 0 ?Application of Medications - topical ?[]  - 0 ?Application of Medications - injection ?INTERVENTIONS - Miscellaneous ?[]  - External ear exam 0 ?[]  - 0 ?Specimen Collection (cultures, biopsies, blood, body fluids, etc.) ?[]  - 0 ?Specimen(s) / Culture(s) sent or taken to Lab for analysis ?[]  - 0 ?Patient Transfer (multiple staff / [employer] fast streamer / Similar devices) ?[]  - 0 ?Simple Staple / Suture removal (25 or less) ?[]  - 0 ?Complex Staple / Suture removal (26 or more) ?[]  - 0 ?Hypo / Hyperglycemic Management (close monitor of Blood Glucose) ?[]  - 0 ?Ankle / Brachial Index (ABI) - do not check if billed separately ?X- 1 5 ?Vital Signs ?Has the patient been seen at the hospital within the last three years: Yes ?Total Score: 95 ?Level Of Care: New/Established - Level ?3 ?Electronic Signature(s) ?Signed: 10/19/2021 4:28:03 PM By: Gretta Cool, BSN, RN, CWS, Kim RN, BSN ?Entered By: Gretta Cool, BSN, RN, CWS, Kim on 10/19/2021 10:42:36 ?Jeremy [name], Jeremy [name] ([id number]) ?-------------------------------------------------------------------------------- ?Encounter Discharge Information Details ?Patient Name: Jeremy [name], Jeremy [name] ?Date of Service: 10/19/2021 9:45 AM ?Medical Record Number: [id number] ?Patient Account Number: [id number] ?Date of Birth/Sex: 04/27/1956 (66 y.o. M) ?Treating RN: Cornell Barman ?Primary Care Avagail Whittlesey: Tracie Harrier Other Clinician: ?Referring Nainoa Woldt: Tracie Harrier ?Treating Duy Lemming/Extender: Ricard Dillon ?Weeks in Treatment: 6 ?Encounter Discharge Information Items ?Discharge Condition: Stable ?Ambulatory Status: Ambulatory ?Discharge Destination: Home ?Transportation: Private  Auto ?Accompanied By: self ?Schedule Follow-up Appointment: Yes ?Clinical Summary of Care: ?Electronic Signature(s) ?Signed: 10/19/2021 10:44:34 AM By: Gretta Cool, BSN, RN, CWS, Kim RN, BSN ?Entered By: Gretta Cool, BSN, RN, CWS, Kim on 10/19/2021 10:44:34 ?Jeremy [name], Jeremy [name] ([id number]) ?-------------------------------------------------------------------------------- ?Lower Extremity Assessment Details ?Patient Name: Jeremy [name], Jeremy [name] ?Date of Service: 10/19/2021 9:45 AM ?Medical Record Number: [id number] ?Patient Account Number: [id number] ?Date of Birth/Sex: Jun 26, 1955 (66 y.o. M) ?Treating RN: Cornell Barman ?Primary Care Lanya Bucks: Tracie Harrier Other Clinician: ?Referring Josejulian Tarango: Tracie Harrier ?Treating Camellia Popescu/Extender: Ricard Dillon ?Weeks in Treatment: 6 ?Edema Assessment ?Assessed: [Left: No] [Right: Yes] ?Edema: [Left: N] [Right: o] ?Vascular Assessment ?Pulses: ?Dorsalis Pedis ?Palpable: [Right:Yes] ?Electronic Signature(s) ?Signed: 10/19/2021 4:28:03 PM By: Gretta Cool, BSN, RN, CWS, Kim RN, BSN ?Entered By: Gretta Cool, BSN, RN, CWS, Kim on 10/19/2021 09:49:24 ?Jeremy [name] ([id number]) ?-------------------------------------------------------------------------------- ?Multi Wound Chart Details ?Patient Name: Jeremy [name], Jeremy [name] ?Date of Service: 10/19/2021 9:45 AM ?Medical Record Number: [id number] ?Patient Account Number: [id number] ?Date of Birth/Sex: 01-Aug-1955 (66 y.o. M) ?Treating RN: Cornell Barman ?Primary Care Shayle Donahoo: Tracie Harrier Other Clinician: ?Referring Yazid Pop: Tracie Harrier ?Treating Reily Ilic/Extender: Ricard Dillon ?Weeks in Treatment: 6 ?Vital Signs ?Height(in): 71 ?Pulse(bpm): 134 ?Weight(lbs): 250 ?Blood Pressure(mmHg): 120/83 ?Body Mass Index(BMI): 34.9 ?Temperature(??F): 98.2 ?Respiratory Rate(breaths/min): ?Photos: [N/A:N/A] ?Wound Location: Right, Lateral Ankle N/A N/A ?Wounding Event: Gradually Appeared N/A N/A ?Primary Etiology: Refractory Osteomyelitis N/A N/A ?Secondary Etiology: Diabetic  Wound/Ulcer of the Lower N/A N/A ?Extremity ?Comorbid History: Arrhythmia, 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

## 2021-10-20 NOTE — Progress Notes (Signed)
Jeremy [name], Jeremy [name] ([id number]) ?Visit Report for 10/19/2021 ?HPI Details ?Patient Name: Jeremy [name], Jeremy [name] ?Date of Service: 10/19/2021 9:45 AM ?Medical Record Number: [id number] ?Patient Account Number: [id number] ?Date of Birth/Sex: Sep 15, 1955 (66 y.o. M) ?Treating RN: Huel Coventry ?Primary Care Provider: Barbette Reichmann Other Clinician: ?Referring Provider: Barbette Reichmann ?Treating Provider/Extender: Maxwell Caul ?Weeks in Treatment: 6 ?History of Present Illness ?HPI Description: 08/18/2020 upon evaluation today patient presents for initial inspection here in our clinic concerning issues has been having with ?his right lateral ankle and this has been present for at least a year he tells me. He is a patient of Dr. Orland Jarred. Subsequently Dr. Orland Jarred has since ?retired hence the patient look this up and is coming here for wound care to see if we can help him out. He did have a screw pushed out of this ?location and since that time tells me that the screw was removed but nonetheless the hole has remained. Fortunately there does not appear to be ?any signs of active infection systemically at this point though it does raise the question as to whether or not this might be a bone/hardware ?infection being that this has been open for so long. The patient does have a history of diabetes mellitus type 2, coronary artery disease, ?hypertension, and unfortunately does not seem to be doing as great as I would like to see him regard to his left ankle. ?08/25/2020 upon evaluation today patient appears to be doing well with regard to his ankle ulcer compared to last week this is definitely smaller. ?With that being said it still does probe down to bone/hardware. I still think this can be appropriate for him to see the orthopedic specialist. He has ?not heard from them yet he tells me. ?09/05/2020 upon evaluation today patient appears to be doing extremely well in regard to his wound all things considered. I do not see that this  is ?gotten any worse. Unfortunately also do not think he is gotten any better. We did make a referral to orthopedics to Dr. Victorino Dike specifically but the ?patient tells me that he has not heard from them. That is unusual as they normally get in touch with patients fairly quickly. I will ask him if he ?possibly missed a phone call he tells me he will check when he gets home. Nonetheless he does not want to go to [facility] anyway which he ?did not tell me previously therefore we will get a see about making a referral to Dr. Logan Bores to see what he potentially could recommend for the ?patient as well I think Dr. Logan Bores is excellent and a good option here. ?Readmission: ?10/28/2020 upon evaluation today patient appears to be doing about the same as when I last saw him. He did not come back for follow-up last ?time I saw him was 09/05/2020. Basically he tells me that he has been attempting to manage this on his own using the Hydrofera Blue rope. He ?did get a call from [facility] but they did not actually get him scheduled due to the fact that the patient told me that he told them he did ?not wanted to see them he just wanted to come back and see me because he was happy with my care. Nonetheless as I explained to the patient ?he has a fractured screw at the site of the wound he also has another screw where there appears to be some loosening of the screw and potential ?for hardware/bone infection here. Subsequently I think he really does  need to see a specialist to see what they can do to help him out I do not ?believe him to be able to get this healed without taking care of the hardware issues. 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 He has ?been advised previous of a need for an above-knee amputation due to some of the aggressive breakdown in the ankle region. Nonetheless he has ?had the removal of the screw  performed in office with Dr. Excell SeltzerBaker and this was on 08/28/2021. Subsequently the patient is a poor historian he also ?gets upset very easily. Of note I do believe that he is going to require some dressings to be packed into the area due to the fact that to be honest ?this is still tracking all the way straight down to bone where there was pus and purulence coming out once I did get down to that area. ?Subsequently he tells me that he is having pain although it does seem to be doing better since the screw was removed. 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 Therefore his best options probably do entail the continue following up by Dr. Sampson GoonFitzgerald for infectious disease coupled with wound care ?as best we can and try to see if we keep this clean as much as possible and keep it from becoming more infected. At some point there may be a ?time where this could continually get worse and may end up not doing nearly as well but right now he does not seem to be doing too poorly and I ?think this is the best way to support him. I am not overly confident that this is ever getting heal however. ?09/15/21 upon evaluation today patient appears to be doing well with regard to his wound. He has been tolerating the dressing changes without ?complication. Fortunately I do not see any signs of active infection at this time. Overall I think that he is doing quite well. ?09-22-2021 upon evaluation today patient appears to be doing about the same in regard to his ankle. Again this is something that I am not really ?certain is going to heal is not extremely well he does have chronic osteomyelitis. He again has been told that there is probably not a likely ?scenario in which this is able to heal. Nonetheless he is not a HBO candidate due to low ejection fraction. He does see Dr. Sampson GoonFitzgerald he still  taking ?antibiotics at this point we will try to keep the area open so does not develop an abscess. ?09-29-2021 upon evaluation today patient appears to be doing well with regard to his wound all things considered. He continues to use the ?Hydrofera Blue rope. This is still a fairly deep wound that I think the rope is doing well to help keep the drainage from collecting in the base of the ?wound there does appear to be less drainage definitely not as purulent as what we have noticed in the past. There is no increased pain and no ?signs of worsening in general. ?10-06-2021 upon evaluation today patient's wound is continuing to show signs of issues here at this point. Fortunately I do not see any evidence of ?active infection locally or systemically which is great news but at the same time he is definitely having some ongoing  problems here currently. ?5/8; 2-week follow-up. Right medial ankle. Small wound purulent drainage. He is on doxycycline chronically for underlying osteomyelitis. He has ?hardware in this area. He is using Hydrofera Blue packing strips ?Jeremy [name], Jeremy [name] ([id number]) ?Electronic Signature(s) ?Signed: 10/20/2021 6:42:33 AM By: Baltazar Najjar MD ?Entered By: Baltazar Najjar on 10/19/2021 10:43:24 ?[name] ([id number]) ?-------------------------------------------------------------------------------- ?Physical Exam Details ?Patient Name: Jeremy [name], Jeremy [name] ?Date of Service: 10/19/2021 9:45 AM ?Medical Record Number: [id number] ?Patient Account Number: [id number] ?Date of Birth/Sex: [birth year] (66 y.o. M) ?Treating RN: Huel Coventry ?Primary Care Provider: Barbette Reichmann Other Clinician: ?Referring Provider: Barbette Reichmann ?Treating Provider/Extender: Maxwell Caul ?Weeks in Treatment: 6 ?Constitutional ?Sitting or standing Blood Pressure is within target range for patient.. Tachycardic. Marland Kitchen Temperature is normal and within the target range for the ?patient.Marland Kitchen Appears well, no systemic  illness. ?Cardiovascular ?Pedal pulses palpable on the right. ?Notes ?Wound exam; small open wound relative moderate amount of purulent drainage no surrounding erythema no crepitus. Using a skinny this probes ?easily to bone ?E

## 2021-11-02 ENCOUNTER — Encounter: Payer: Medicaid Other | Admitting: Physician Assistant

## 2021-11-02 DIAGNOSIS — E1169 Type 2 diabetes mellitus with other specified complication: Secondary | ICD-10-CM | POA: Diagnosis not present

## 2021-11-02 NOTE — Progress Notes (Addendum)
RUDOLPH, DAOUST ([id number]) Visit Report for 11/02/2021 Chief Complaint Document Details Patient Name: Jeremy [name], Jeremy [name] Date of Service: 11/02/2021 9:45 AM Medical Record Number: [id number] Patient Account Number: [id number] Date of Birth/Sex: 12/12/1955 (66 y.o. M) Treating RN: Huel Coventry Primary Care Provider: Barbette Reichmann Other Clinician: Referring Provider: Barbette Reichmann Treating Provider/Extender: Rowan Blase in Treatment: 8 Information Obtained from: Patient Chief Complaint Right ankle ulcer Electronic Signature(s) Signed: 11/02/2021 10:01:14 AM By: Lenda Kelp PA-C Entered By: Lenda Kelp on 11/02/2021 10:01:14 Jeremy [name] ([id number]) -------------------------------------------------------------------------------- HPI Details Patient Name: Jeremy [name] Date of Service: 11/02/2021 9:45 AM Medical Record Number: [id number] Patient Account Number: [id number] Date of Birth/Sex: May 13, 1956 (66 y.o. M) Treating RN: Huel Coventry Primary Care Provider: Barbette Reichmann Other Clinician: Referring Provider: Barbette Reichmann Treating Provider/Extender: Rowan Blase in Treatment: 8 History of Present Illness HPI Description: 08/18/2020 upon evaluation today patient presents for initial inspection here in our clinic concerning issues has been having with his right lateral ankle and this has been present for at least a year he tells me. He is a patient of Dr. Orland Jarred. Subsequently Dr. Orland Jarred has since retired hence the patient look this up and is coming here for wound care to see if we can help him out. He did have a screw pushed out of this location and since that time tells me that the screw was removed but nonetheless the hole has remained. Fortunately there does not appear to be any signs of active infection systemically at this point though it does raise the question as to whether or not this might be a bone/hardware infection being that this has been  open for so long. The patient does have a history of diabetes mellitus type 2, coronary artery disease, hypertension, and unfortunately does not seem to be doing as great as I would like to see him regard to his left ankle. 08/25/2020 upon evaluation today patient appears to be doing well with regard to his ankle ulcer compared to last week this is definitely smaller. With that being said it still does probe down to bone/hardware. I still think this can be appropriate for him to see the orthopedic specialist. He has not heard from them yet he tells me. 09/05/2020 upon evaluation today patient appears to be doing extremely well in regard to his wound all things considered. I do not see that this is gotten any worse. Unfortunately also do not think he is gotten any better. We did make a referral to orthopedics to Dr. Victorino Dike specifically but the patient tells me that he has not heard from them. That is unusual as they normally get in touch with patients fairly quickly. I will ask him if he possibly missed a phone call he tells me he will check when he gets home. Nonetheless he does not want to go to [facility] anyway which he did not tell me previously therefore we will get a see about making a referral to Dr. Logan Bores to see what he potentially could recommend for the patient as well I think Dr. Logan Bores is excellent and a good option here. Readmission: 10/28/2020 upon evaluation today patient appears to be doing about the same as when I last saw him. He did not come back for follow-up last time I saw him was 09/05/2020. Basically he tells me that he has been attempting to manage this on his own using the Hydrofera Blue rope. He did get a call from [facility] but they did not actually get  him scheduled due to the fact that the patient told me that he told them he did not wanted to see them he just wanted to come back and see me because he was happy with my care. Nonetheless as I explained to the  patient he has a fractured screw at the site of the wound he also has another screw where there appears to be some loosening of the screw and potential for hardware/bone infection here. Subsequently I think he really does need to see a specialist to see what they can do to help him out I do not believe him to be able to get this healed without taking care of the hardware issues. 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 He has been advised previous of a need for an above-knee amputation due to some of the aggressive breakdown in the ankle region. Nonetheless he has had the removal of the screw performed in office with Dr. Excell Seltzer and this was on 08/28/2021. Subsequently the patient is a poor historian he also gets upset very easily. Of note I do believe that he is going to require some dressings to be packed into the area due to the fact that to be honest this is still tracking all the way straight down to bone where there was pus and purulence coming out once I did get down to that area. Subsequently he tells me that he is having pain although it does seem to be doing better since the screw was removed. 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 Therefore his best options probably do entail the continue following up by Dr. Sampson Goon for infectious disease coupled with wound care as best we can and try to see if we keep this clean as much as possible and keep it from becoming more infected. At some point there may be a time where this could continually get worse and may end up not doing nearly as well but right now he does not seem to be doing too poorly and I think this is the best way to support him. I am not overly confident that this is ever getting heal  however. 09/15/21 upon evaluation today patient appears to be doing well with regard to his wound. He has been tolerating the dressing changes without complication. Fortunately I do not see any signs of active infection at this time. Overall I think that he is doing quite well. 09-22-2021 upon evaluation today patient appears to be doing about the same in regard to his ankle. 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 He again has been told that there is probably not a likely scenario in which this is able to heal. Nonetheless he is not a HBO candidate due to low ejection fraction. He does see Dr. Sampson Goon he still taking antibiotics at this point we will try to keep the area open so does not develop an abscess. 09-29-2021 upon evaluation today patient appears to be doing well with regard to his wound all things considered. He continues to use the [employer]	[place] rope. This is still a fairly deep wound that I think the rope is doing well to help keep the drainage from  collecting in the base of the wound there does appear to be less drainage definitely not as purulent as what we have noticed in the past. There is no increased pain and no signs of worsening in general. 10-06-2021 upon evaluation today patient's wound is continuing to show signs of issues here at this point. Fortunately I do not see any evidence of active infection locally or systemically which is great news but at the same time he is definitely having some ongoing problems here currently. 5/8; 2-week follow-up. Right medial ankle. Small wound purulent drainage. He is on doxycycline chronically for underlying osteomyelitis. He has hardware in this area. He is using Hydrofera Blue packing strips 11-02-2021 upon evaluation today patient appears to be doing much better in regard to his wound I am very pleased to see this. I do not see any evidence of active  infection locally nor systemically which is great news. No fevers, chills, nausea, vomiting, or diarrhea. Jeremy [name], Jeremy [name] ([id number]) Electronic Signature(s) Signed: 11/02/2021 11:19:04 AM By: Lenda KelpStone III, Samra Pesch PA-C Entered By: Lenda KelpStone III, Jaston Havens on 11/02/2021 11:19:04 Jeremy [name], Jeremy [name] ([id number]) -------------------------------------------------------------------------------- Physical Exam Details Patient Name: Jeremy [name], Jeremy [name] Date of Service: 11/02/2021 9:45 AM Medical Record Number: [id number] Patient Account Number: [id number] Date of Birth/Sex: 1955/12/15 (66 y.o. M) Treating RN: Huel CoventryWoody, Kim Primary Care Provider: Barbette ReichmannHande, Vishwanath Other Clinician: Referring Provider: Barbette ReichmannHande, Vishwanath Treating Provider/Extender: Rowan BlaseStone, Demarion Pondexter Weeks in Treatment: 8 Constitutional Well-nourished and well-hydrated in no acute distress. Respiratory normal breathing without difficulty. Psychiatric this patient is able to make decisions and demonstrates good insight into disease process. Alert and Oriented x 3. pleasant and cooperative. Notes Upon inspection patient's wound bed actually showed signs of a lot of new epithelization I am actually very pleased with this there is really nothing to pack into at this time. He voiced understanding he is very happy to hear this as well he tells me that he thinks he is good to be healed soon. Electronic Signature(s) Signed: 11/02/2021 11:19:22 AM By: Lenda KelpStone III, Ordean Fouts PA-C Entered By: Lenda KelpStone III, Makayle Krahn on 11/02/2021 11:19:22 Jeremy [name], Jeremy [name] ([id number]) -------------------------------------------------------------------------------- Physician Orders Details Patient Name: Jeremy [name], Jeremy [name] Date of Service: 11/02/2021 9:45 AM Medical Record Number: [id number] Patient Account Number: [id number] Date of Birth/Sex: 1955/12/15 (66 y.o. 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 Bathing/ Shower/ Hygiene o May shower; gently cleanse wound with antibacterial soap, rinse and pat dry prior to dressing wounds o No tub bath. Edema Control - Lymphedema / Segmental Compressive Device / Other o Elevate leg(s) parallel to the floor when sitting. o DO YOUR BEST to sleep in the bed at night. DO NOT sleep in your recliner. Long hours of sitting in a recliner leads to swelling of the legs and/or potential wounds on your backside. Additional Orders / Instructions o Follow Nutritious Diet and Increase Protein Intake - monitor blood glucose to maintain normal level Medications-Please add to medication list. o P.O. Antibiotics - Continue Doxycycline as ordered by Infectious Disease provider Wound Treatment Wound #3 - Ankle Wound Laterality:  Right, Lateral Cleanser: Normal Saline 1 x Per Day/30 Days Discharge Instructions: Wash your hands with soap and water. Remove old dressing, discard into plastic bag and place into trash. Cleanse the wound with Normal Saline prior to applying a clean dressing using gauze sponges, not tissues or cotton balls. Do not scrub or use excessive force. Pat dry using gauze sponges, not tissue or cotton balls. Primary Dressing: Hydrofera Blue Ready Transfer Foam, 2.5x2.5 (in/in) 1 x Per Day/30 Days Discharge Instructions: Apply Hydrofera Blue Ready to wound bed as directed Secondary Dressing: Coverlet Latex-Free  Fabric Adhesive Dressings 1 x Per Day/30 Days Discharge Instructions: 1.5 x 2 Electronic Signature(s) Signed: 11/02/2021 4:26:20 PM By: Lenda Kelp PA-C Signed: 03/12/2022 12:24:53 PM By: [name] Entered By: [name] on 11/02/2021 10:07:56 Jeremy [name] ([id number]) -------------------------------------------------------------------------------- Problem List Details Patient Name: Jeremy [name] Date of Service: 11/02/2021 9:45 AM Medical Record Number: [id number] Patient Account Number: [id number] Date of Birth/Sex: 28-Aug-1955 (66 y.o. 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 without 09/07/2021 No Yes evidence of necrosis E11.622 Type 2 diabetes mellitus with other skin ulcer 09/07/2021 No Yes I10 Essential (primary) hypertension 09/07/2021 No Yes I25.10 Atherosclerotic heart disease of native coronary artery without angina 09/07/2021 No Yes pectoris Inactive Problems Resolved Problems Electronic Signature(s) Signed: 11/02/2021 10:01:11 AM By: Lenda Kelp PA-C Entered By: Lenda Kelp on 11/02/2021 10:01:11 Jeremy [name] ([id number]) -------------------------------------------------------------------------------- Progress Note Details Patient Name: Jeremy [name] Date of Service: 11/02/2021 9:45 AM Medical Record Number: [id number] Patient Account Number: [id number] Date of Birth/Sex: [birth year] (66 y.o. M) Treating RN: Huel Coventry Primary Care Provider: Barbette Reichmann Other Clinician: Referring Provider: Barbette Reichmann Treating Provider/Extender: Rowan Blase in  Treatment: 8 Subjective Chief Complaint Information obtained from Patient Right ankle ulcer History of Present Illness (HPI) 08/18/2020 upon evaluation today patient presents for initial inspection here in our clinic concerning issues has been having with his right lateral ankle and this has been present for at least a year he tells me. He is a patient of Dr. Orland Jarred. Subsequently Dr. Orland Jarred has since retired hence the patient look this up and is coming here for wound care to see if we can help him out. He did have a screw pushed out of this location and since that time tells me that the screw was removed but nonetheless the hole has remained. Fortunately there does not appear to be any signs of active infection systemically at this point though it does raise the question as to whether or not this might be a bone/hardware infection being that this has been open for so long. The patient does have a history of diabetes mellitus type 2, coronary artery disease, hypertension, and unfortunately does not seem to be doing as great as I would like to see him regard to his left ankle. 08/25/2020 upon evaluation today patient appears to be doing well with regard to his ankle ulcer compared to last week this is definitely smaller. With that being said it still does probe down to bone/hardware. I still think this can be appropriate for him to see the orthopedic specialist. He has not heard from them yet he tells me. 09/05/2020 upon evaluation today patient appears to be  doing extremely well in regard to his wound all things considered. I do not see that this is gotten any worse. Unfortunately also do not think he is gotten any better. We did make a referral to orthopedics to Dr. Victorino Dike specifically but the patient tells me that he has not heard from them. That is unusual as they normally get in touch with patients fairly quickly. I will ask him if he possibly missed a phone call he tells me he will check when he  gets home. Nonetheless he does not want to go to [facility] anyway which he did not tell me previously therefore we will get a see about making a referral to Dr. Logan Bores to see what he potentially could recommend for the patient as well I think Dr. Logan Bores is excellent and a good option here. Readmission: 10/28/2020 upon evaluation today patient appears to be doing about the same as when I last saw him. He did not come back for follow-up last time I saw him was 09/05/2020. Basically he tells me that he has been attempting to manage this on his own using the Hydrofera Blue rope. He did get a call from [facility] but they did not actually get him scheduled due to the fact that the patient told me that he told them he did not wanted to see them he just wanted to come back and see me because he was happy with my care. Nonetheless as I explained to the patient he has a fractured screw at the site of the wound he also has another screw where there appears to be some loosening of the screw and potential for hardware/bone infection here. Subsequently I think he really does need to see a specialist to see what they can do to help him out I do not believe him to be able to get this healed without taking care of the hardware issues. 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 He has been advised previous of a need for an above-knee amputation due to some of the aggressive breakdown in the ankle region. Nonetheless he has had the removal of the screw performed in office with Dr. Excell Seltzer and this was on 08/28/2021. Subsequently the patient is a poor historian he also gets upset very easily. Of note I do believe that he is going to require some dressings to be packed into the area due to the fact that to be honest this is still tracking all the way straight down to bone where there was pus and purulence coming  out once I did get down to that area. Subsequently he tells me that he is having pain although it does seem to be doing better since the screw was removed. 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 Therefore his best options probably do entail the continue following up by Dr. Sampson Goon for infectious disease coupled with wound care as best we can and try to see if we keep this clean as much as possible and keep it from becoming more infected. At some point there may be a time where this could continually get worse and may end up not doing nearly as well but right now he does not seem to be doing too poorly and I think this is  the best way to support him. I am not overly confident that this is ever getting heal however. 09/15/21 upon evaluation today patient appears to be doing well with regard to his wound. He has been tolerating the dressing changes without complication. Fortunately I do not see any signs of active infection at this time. Overall I think that he is doing quite well. 09-22-2021 upon evaluation today patient appears to be doing about the same in regard to his ankle. 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 He again has been told that there is probably not a likely scenario in which this is able to heal. Nonetheless he is not a HBO candidate due to low ejection fraction. He does see Dr. Sampson Goon he still taking antibiotics at this point we will try to keep the area open so does not develop an abscess. 09-29-2021 upon evaluation today patient appears to be doing well with regard to his wound all things considered. He continues to use the [employer]	[place] rope. This is still a fairly deep wound that I think the rope is doing well to help keep the drainage from  collecting in the base of the wound there does appear to be less drainage definitely not as purulent as what we have noticed in the past. There is no increased pain and no signs of worsening in general. 10-06-2021 upon evaluation today patient's wound is continuing to show signs of issues here at this point. Fortunately I do not see any evidence of active infection locally or systemically which is great news but at the same time he is definitely having some ongoing problems here currently. 5/8; 2-week follow-up. Right medial ankle. Small wound purulent drainage. He is on doxycycline chronically for underlying osteomyelitis. He has Jeremy [name], Jeremy [name] ([id number]) hardware in this area. He is using Hydrofera Blue packing strips 11-02-2021 upon evaluation today patient appears to be doing much better in regard to his wound I am very pleased to see this. I do not see any evidence of active infection locally nor systemically which is great news. No fevers, chills, nausea, vomiting, or diarrhea. Objective Constitutional Well-nourished and well-hydrated in no acute distress. Vitals Time Taken: 9:50 AM, Height: 71 in, Weight: 250 lbs, BMI: 34.9, Temperature: 98.3 F, Pulse: 72 bpm, Respiratory Rate: 18 breaths/min, Blood Pressure: 131/88 mmHg. Respiratory normal breathing without difficulty. Psychiatric this patient is able to make decisions and demonstrates good insight into disease process. Alert and Oriented x 3. pleasant and cooperative. General Notes: Upon inspection patient's wound bed actually showed signs of a lot of new epithelization I am actually very pleased with this there is really nothing to pack into at this time. He voiced understanding he is very happy to hear this as well he tells me that he thinks he is good to be healed soon. Integumentary (Hair, Skin) Wound #3 status is Open. Original cause of wound was Gradually Appeared. The date acquired was: 08/24/2021. The wound has been in  treatment 8 weeks. The wound is located on the Right,Lateral Ankle. The wound measures 0.7cm length x 0.5cm width x 0.5cm depth; 0.275cm^2 area and 0.137cm^3 volume. There is bone and Fat Layer (Subcutaneous Tissue) exposed. There is a large amount of serous drainage noted. There is medium (34-66%) pink granulation within the wound bed. There is a small (1-33%) amount of necrotic tissue within the wound bed. 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 Bathing/ Shower/ Hygiene: May shower; gently cleanse wound with antibacterial soap, rinse and pat dry prior to dressing wounds No tub bath. Edema Control - Lymphedema / Segmental Compressive Device / Other: Elevate leg(s) parallel to the floor when sitting. DO YOUR BEST to sleep in the bed at night. DO NOT sleep in your recliner. Long hours of sitting in a recliner leads to swelling of the legs and/or potential wounds on your backside. Additional Orders / Instructions: Follow Nutritious Diet and Increase Protein Intake - monitor blood glucose to maintain normal level Medications-Please add to medication list.: P.O. Antibiotics - Continue Doxycycline as ordered by Infectious Disease provider WOUND #3: - Ankle Wound Laterality: Right, Lateral Cleanser: Normal Saline 1 x Per Day/30 Days Jeremy [name], Jeremy [name] ([id number]) Discharge Instructions: Wash your hands with soap and water. Remove old dressing, discard into plastic bag and place into trash. Cleanse the wound with Normal Saline prior to applying a clean dressing using gauze sponges, not tissues or cotton balls. Do not scrub or  use excessive force. Pat dry using gauze sponges, not tissue or cotton balls. Primary Dressing: Hydrofera Blue Ready Transfer Foam, 2.5x2.5 (in/in) 1 x Per Day/30 Days Discharge Instructions: Apply Hydrofera Blue Ready to wound bed as directed Secondary Dressing: Coverlet Latex-Free Fabric Adhesive Dressings 1 x Per Day/30 Days Discharge Instructions: 1.5 x 2 1. I am going to suggest that we go ahead and continue with the wound care measures as before and the patient is in agreement with plan. This includes the use of the [facility] which I think is doing well currently. 2. I am also can recommend that we continue with the dressing to cover which I think is doing well there is no signs of this aggravating her skin and again we will continue as such with the plan. We will see patient back for reevaluation in 1 week here in the clinic. If anything worsens or changes patient will contact our office for additional recommendations. Electronic Signature(s) Signed: 11/02/2021 11:20:34 AM By: Lenda Kelp PA-C Entered By: Lenda Kelp on 11/02/2021 11:20:34 Jeremy [name] ([id number]) -------------------------------------------------------------------------------- SuperBill Details Patient Name: Jeremy [name] Date of Service: 11/02/2021 Medical Record Number: [id number] Patient Account Number: [id number] Date of Birth/Sex: 03-Oct-1955 (66 y.o. 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 E11.622 Type 2 diabetes mellitus with other skin ulcer I10 Essential (primary) hypertension I25.10 Atherosclerotic heart disease of native  coronary artery without angina pectoris Facility Procedures CPT4 Code: 41324401 Description: 99213 - WOUND CARE VISIT-LEV 3 EST PT Modifier: Quantity: 1 Physician Procedures CPT4 Code Description: [id number] 99213 - WC PHYS LEVEL 3 - EST PT Modifier: Quantity: 1 CPT4 Code Description: ICD-10 Diagnosis Description M86.471 Chronic osteomyelitis with draining sinus, 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

## 2021-11-16 ENCOUNTER — Encounter: Payer: Medicaid Other | Attending: Physician Assistant | Admitting: Physician Assistant

## 2021-11-16 DIAGNOSIS — I2510 Atherosclerotic heart disease of native coronary artery without angina pectoris: Secondary | ICD-10-CM | POA: Insufficient documentation

## 2021-11-16 DIAGNOSIS — F17200 Nicotine dependence, unspecified, uncomplicated: Secondary | ICD-10-CM | POA: Insufficient documentation

## 2021-11-16 DIAGNOSIS — M86471 Chronic osteomyelitis with draining sinus, right ankle and foot: Secondary | ICD-10-CM | POA: Diagnosis present

## 2021-11-16 DIAGNOSIS — I4891 Unspecified atrial fibrillation: Secondary | ICD-10-CM | POA: Insufficient documentation

## 2021-11-16 DIAGNOSIS — L97316 Non-pressure chronic ulcer of right ankle with bone involvement without evidence of necrosis: Secondary | ICD-10-CM | POA: Insufficient documentation

## 2021-11-16 DIAGNOSIS — I110 Hypertensive heart disease with heart failure: Secondary | ICD-10-CM | POA: Diagnosis not present

## 2021-11-16 DIAGNOSIS — M1990 Unspecified osteoarthritis, unspecified site: Secondary | ICD-10-CM | POA: Insufficient documentation

## 2021-11-16 DIAGNOSIS — E11622 Type 2 diabetes mellitus with other skin ulcer: Secondary | ICD-10-CM | POA: Insufficient documentation

## 2021-11-16 DIAGNOSIS — E11621 Type 2 diabetes mellitus with foot ulcer: Secondary | ICD-10-CM | POA: Insufficient documentation

## 2021-11-16 DIAGNOSIS — I509 Heart failure, unspecified: Secondary | ICD-10-CM | POA: Insufficient documentation

## 2021-11-16 NOTE — Progress Notes (Signed)
RICO, WECKESSER ([id number]) Visit Report for 11/16/2021 Chief Complaint Document Details Patient Name: Jeremy [name], BUREK Date of Service: 11/16/2021 9:45 AM Medical Record Number: [id number] Patient Account Number: [id number] Date of Birth/Sex: December 30, 1955 (65 y.o. M) Treating RN: Carlene Coria Primary Care Provider: Tracie Harrier Other Clinician: Massie Kluver Referring Provider: Tracie Harrier Treating Provider/Extender: Skipper Cliche in Treatment: 10 Information Obtained from: Patient Chief Complaint Right ankle ulcer Electronic Signature(s) Signed: 11/16/2021 9:52:25 AM By: Worthy Keeler PA-C Entered By: Worthy Keeler on 11/16/2021 09:52:25 [name] ([id number]) -------------------------------------------------------------------------------- Problem List Details Patient Name: [name] Date of Service: 11/16/2021 9:45 AM Medical Record Number: [id number] Patient Account Number: [id number] Date of Birth/Sex: 01-29-1956 (65 y.o. 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

## 2021-11-17 NOTE — Progress Notes (Signed)
BENJAMINE, STROUT ([id number]) Visit Report for 11/16/2021 Arrival Information Details Patient Name: Jeremy [name], Jeremy [name] Date of Service: 11/16/2021 9:45 AM Medical Record Number: [id number] Patient Account Number: [id number] Date of Birth/Sex: [birth year] (66 y.o. M) Treating RN: Yevonne Pax Primary Care Debe Anfinson: Barbette Reichmann Other Clinician: [name] Referring Tanay Massiah: Barbette Reichmann Treating Nirav Sweda/Extender: Rowan Blase in Treatment: 10 Visit Information History Since Last Visit Added or deleted any medications: No Patient Arrived: Ambulatory Any new allergies or adverse reactions: No Arrival Time: 09:34 Had a fall or experienced change in No Transfer Assistance: None activities of daily living that may affect Patient Requires Transmission-Based No risk of falls: Precautions: Hospitalized since last visit: No Patient Has Alerts: Yes Pain Present Now: No Patient Alerts: Patient on Blood Thinner Eliquis Electronic Signature(s) Signed: 11/17/2021 4:20:44 PM By: [name] Entered By: [name] on 11/16/2021 09:35:12 Jeremy [name] ([id number]) -------------------------------------------------------------------------------- Clinic Level of Care Assessment Details Patient Name: Jeremy [name] Date of Service: 11/16/2021 9:45 AM Medical Record Number: [id number] Patient Account Number: [id number] Date of Birth/Sex: 05-23-1956 (66 y.o. M) Treating RN: Yevonne Pax Primary Care Synia Douglass: Barbette Reichmann Other Clinician: [name] Referring Kingdom Vanzanten: Barbette Reichmann Treating Remus Hagedorn/Extender: Rowan Blase in Treatment: 10 Clinic Level of Care Assessment Items TOOL 4 Quantity Score  - Use when only an EandM is performed on FOLLOW-UP visit 0 ASSESSMENTS - Nursing Assessment / Reassessment X - Reassessment of Co-morbidities (includes updates in patient status) 1 10 X- 1 5 Reassessment of Adherence to Treatment Plan ASSESSMENTS - Wound  and Skin Assessment / Reassessment X - Simple Wound Assessment / Reassessment - one wound 1 5  - 0 Complex Wound Assessment / Reassessment - multiple wounds  - 0 Dermatologic / Skin Assessment (not related to wound area) ASSESSMENTS - Focused Assessment  - Circumferential Edema Measurements - multi extremities 0  - 0 Nutritional Assessment / Counseling / Intervention  - 0 Lower Extremity Assessment (monofilament, tuning fork, pulses)  - 0 Peripheral Arterial Disease Assessment (using hand held doppler) ASSESSMENTS - Ostomy and/or Continence Assessment and Care  - Incontinence Assessment and Management 0  - 0 Ostomy Care Assessment and Management (repouching, etc.) PROCESS - Coordination of Care X - Simple Patient / Family Education for ongoing care 1 15  - 0 Complex (extensive) Patient / Family Education for ongoing care X- 1 10 Staff obtains Consents, Records, Test Results / Process Orders  - 0 Staff telephones HHA, Nursing Homes / Clarify orders / etc  - 0 Routine Transfer to another Facility (non-emergent condition)  - 0 Routine Hospital Admission (non-emergent condition)  - 0 New Admissions / Manufacturing engineer / Ordering NPWT, Apligraf, etc.  - 0 Emergency Hospital Admission (emergent condition) X- 1 10 Simple Discharge Coordination  - 0 Complex (extensive) Discharge Coordination PROCESS - Special Needs  - Pediatric / Minor Patient Management 0  - 0 Isolation Patient Management  - 0 Hearing / Language / Visual special needs  - 0 Assessment of Community assistance (transportation, D/C planning, etc.)  - 0 Additional assistance / Altered mentation  - 0 Support Surface(s) Assessment (bed, cushion, seat, etc.) INTERVENTIONS - Wound Cleansing / Measurement Mikaelian, Weylyn ([id number]) X- 1 5 Simple Wound Cleansing - one wound  - 0 Complex Wound Cleansing - multiple wounds X- 1 5 Wound Imaging (photographs - any  number of wounds)  - 0 Wound Tracing (instead of photographs) X- 1 5 Simple Wound Measurement - one wound  - 0 Complex Wound Measurement - multiple wounds INTERVENTIONS - Wound  Dressings X - Small Wound Dressing one or multiple wounds 1 10 []  - 0 Medium Wound Dressing one or multiple wounds []  - 0 Large Wound Dressing one or multiple wounds []  - 0 Application of Medications - topical []  - 0 Application of Medications - injection INTERVENTIONS - Miscellaneous []  - External ear exam 0 []  - 0 Specimen Collection (cultures, biopsies, blood, body fluids, etc.) []  - 0 Specimen(s) / Culture(s) sent or taken to Lab for analysis []  - 0 Patient Transfer (multiple staff / Nurse, adultHoyer Lift / Similar devices) []  - 0 Simple Staple / Suture removal (25 or less) []  - 0 Complex Staple / Suture removal (26 or more) []  - 0 Hypo / Hyperglycemic Management (close monitor of Blood Glucose) []  - 0 Ankle / Brachial Index (ABI) - do not check if billed separately X- 1 5 Vital Signs Has the patient been seen at the hospital within the last three years: Yes Total Score: 85 Level Of Care: New/Established - Level 3 Electronic Signature(s) Signed: 11/17/2021 4:20:44 PM By: Betha LoaVenable, Angie Entered By: Betha LoaVenable, Angie on 11/16/2021 10:06:42 Jeremy [name], Skylan ([id number]) -------------------------------------------------------------------------------- Encounter Discharge Information Details Patient Name: Jeremy [name], Jeremy [name] Date of Service: 11/16/2021 9:45 AM Medical Record Number: [id number] Patient Account Number: [id number] Date of Birth/Sex: 23-Nov-1955 (66 y.o. M) Treating RN: Yevonne PaxEpps, [name] Primary Care Kaarin Pardy: Barbette ReichmannHande, Vishwanath Other Clinician: Betha LoaVenable, Angie Referring Ely Ballen: Barbette ReichmannHande, Vishwanath Treating Amritpal Shropshire/Extender: Rowan BlaseStone, Hoyt Weeks in Treatment: 10 Encounter Discharge Information Items Discharge Condition: Stable Ambulatory Status: Ambulatory Discharge Destination: Home Transportation:  Private Auto Accompanied By: self Schedule Follow-up Appointment: Yes Clinical Summary of Care: Electronic Signature(s) Signed: 11/17/2021 4:20:44 PM By: Betha LoaVenable, Angie Entered By: Betha LoaVenable, Angie on 11/16/2021 10:08:28 Jeremy [name], Johnney ([id number]) -------------------------------------------------------------------------------- Lower Extremity Assessment Details Patient Name: Jeremy [name], Odus Date of Service: 11/16/2021 9:45 AM Medical Record Number: [id number] Patient Account Number: [id number] Date of Birth/Sex: 23-Nov-1955 (66 y.o. M) Treating RN: Yevonne PaxEpps, [name] Primary Care Yoseph Haile: Barbette ReichmannHande, Vishwanath Other Clinician: Betha LoaVenable, Angie Referring Jester Klingberg: Barbette ReichmannHande, Vishwanath Treating Hermen Mario/Extender: Rowan BlaseStone, Hoyt Weeks in Treatment: 10 Electronic Signature(s) Signed: 11/16/2021 5:11:25 PM By: Yevonne PaxEpps, [name] RN Signed: 11/17/2021 4:20:44 PM By: Betha LoaVenable, Angie Entered By: Betha LoaVenable, Angie on 11/16/2021 09:45:59 Jeremy [name], Arby ([id number]) -------------------------------------------------------------------------------- Multi Wound Chart Details Patient Name: Jeremy [name], Jaice Date of Service: 11/16/2021 9:45 AM Medical Record Number: [id number] Patient Account Number: [id number] Date of Birth/Sex: 23-Nov-1955 (66 y.o. M) Treating RN: Yevonne PaxEpps, [name] Primary Care Gianfranco Araki: Barbette ReichmannHande, Vishwanath Other Clinician: Betha LoaVenable, Angie Referring Chetan Mehring: Barbette ReichmannHande, Vishwanath Treating Evadean Sproule/Extender: Rowan BlaseStone, Hoyt Weeks in Treatment: 10 Vital Signs Height(in): 71 Pulse(bpm): 71 Weight(lbs): 250 Blood Pressure(mmHg): 138/84 Body Mass Index(BMI): 34.9 Temperature(F): 97.8 Respiratory Rate(breaths/min): 18 Photos: [N/A:N/A] Wound Location: Right, Lateral Ankle N/A N/A Wounding Event: Gradually Appeared N/A N/A Primary Etiology: Refractory Osteomyelitis N/A N/A Secondary Etiology: Diabetic Wound/Ulcer of the Lower N/A N/A Extremity Comorbid History: Arrhythmia, Congestive Heart N/A N/A Failure, Coronary Artery  Disease, Hypertension, Type II Diabetes, Osteoarthritis, Osteomyelitis Date Acquired: 08/24/2021 N/A N/A Weeks of Treatment: 10 N/A N/A Wound Status: Open N/A N/A Wound Recurrence: No N/A N/A Pending Amputation on Yes N/A N/A Presentation: Measurements L x W x D (cm) 0.6x0.3x0.3 N/A N/A Area (cm) : 0.141 N/A N/A Volume (cm) : 0.042 N/A N/A % Reduction in Area: -354.80% N/A N/A % Reduction in Volume: -90.90% N/A N/A Classification: Full Thickness With Exposed N/A N/A Support Structures Exudate Amount: Medium N/A N/A Exudate Type: Serous N/A N/A Exudate Color: amber N/A N/A Granulation Amount: Medium (34-66%) N/A N/A Granulation Quality: Pink N/A N/A Necrotic Amount:  Small (1-33%) N/A N/A Exposed Structures: Fat Layer (Subcutaneous Tissue): N/A N/A Yes Bone: Yes Fascia: No Tendon: No Muscle: No Joint: No Epithelialization: Small (1-33%) N/A N/A Treatment Notes Electronic CRUISE, BAUMGARDNER ([id number]) Signed: 11/17/2021 4:20:44 PM By: [name] Entered By: [name] on 11/16/2021 09:46:28 Jeremy [name] ([id number]) -------------------------------------------------------------------------------- Multi-Disciplinary Care Plan Details Patient Name: Jeremy [name] Date of Service: 11/16/2021 9:45 AM Medical Record Number: [id number] Patient Account Number: [id number] Date of Birth/Sex: 1956-01-13 (66 y.o. M) Treating RN: Yevonne Pax Primary Care Zaydon Kinser: Barbette Reichmann Other Clinician: [name] Referring Tanylah Schnoebelen: Barbette Reichmann Treating Justiss Gerbino/Extender: Rowan Blase in Treatment: 10 Active Inactive Osteomyelitis Nursing Diagnoses: Knowledge deficit related to disease process and management Potential for infection: osteomyelitis Goals: Patient/caregiver will verbalize understanding of disease process and disease management Date Initiated: 09/07/2021 Target Resolution Date: 09/18/2021 Goal Status: Active Interventions: Assess  for signs and symptoms of osteomyelitis resolution every visit Provide education on osteomyelitis Notes: Wound/Skin Impairment Nursing Diagnoses: Impaired tissue integrity Knowledge deficit related to smoking impact on wound healing Knowledge deficit related to ulceration/compromised skin integrity Goals: Patient will demonstrate a reduced rate of smoking or cessation of smoking Date Initiated: 09/07/2021 Target Resolution Date: 10/16/2021 Goal Status: Active Patient/caregiver will verbalize understanding of skin care regimen Date Initiated: 09/07/2021 Target Resolution Date: 09/18/2021 Goal Status: Active Ulcer/skin breakdown will have a volume reduction of 30% by week 4 Date Initiated: 09/07/2021 Target Resolution Date: 10/05/2021 Goal Status: Active Ulcer/skin breakdown will have a volume reduction of 50% by week 8 Date Initiated: 09/07/2021 Target Resolution Date: 11/02/2021 Goal Status: Active Ulcer/skin breakdown will have a volume reduction of 80% by week 12 Date Initiated: 09/07/2021 Target Resolution Date: 11/30/2021 Goal Status: Active Ulcer/skin breakdown will heal within 14 weeks Date Initiated: 09/07/2021 Target Resolution Date: 12/14/2021 Goal Status: Active Interventions: Assess patient/caregiver ability to obtain necessary supplies Assess patient/caregiver ability to perform ulcer/skin care regimen upon admission and as needed Assess ulceration(s) every visit Notes: Electronic Signature(s) LUNDY, COZART ([id number]) Signed: 11/16/2021 5:11:25 PM By: Yevonne Pax RN Signed: 11/17/2021 4:20:44 PM By: [name] Entered By: [name] on 11/16/2021 09:46:09 Jeremy [name] ([id number]) -------------------------------------------------------------------------------- Pain Assessment Details Patient Name: Jeremy [name] Date of Service: 11/16/2021 9:45 AM Medical Record Number: [id number] Patient Account Number: [id number] Date of Birth/Sex: 1955-08-02 (65 y.o.  M) Treating RN: Yevonne Pax Primary Care Jolanda Mccann: Barbette Reichmann Other Clinician: [name] Referring Tanaisha Pittman: Barbette Reichmann Treating Kortland Nichols/Extender: Rowan Blase in Treatment: 10 Active Problems Location of Pain Severity and Description of Pain Patient Has Paino No Site Locations Pain Management and Medication Current Pain Management: Electronic Signature(s) Signed: 11/16/2021 5:11:25 PM By: Yevonne Pax RN Signed: 11/17/2021 4:20:44 PM By: [name] Entered By: [name] on 11/16/2021 09:38:04 Jeremy [name] ([id number]) -------------------------------------------------------------------------------- Patient/Caregiver Education Details Patient Name: Jeremy [name] Date of Service: 11/16/2021 9:45 AM Medical Record Number: [id number] Patient Account Number: [id number] Date of Birth/Gender: 09-22-1955 (66 y.o. M) Treating RN: Yevonne Pax Primary Care Physician: Barbette Reichmann Other Clinician: [name] Referring Physician: Barbette Reichmann Treating Physician/Extender: Rowan Blase in Treatment: 10 Education Assessment Education Provided To: Patient Education Topics Provided Infection: Handouts: Infection Prevention and Management Methods: Explain/Verbal Responses: State content correctly Wound/Skin Impairment: Handouts: Other: continue wound care as directed Electronic Signature(s) Signed: 11/17/2021 4:20:44 PM By: [name] Entered By: [name] on 11/16/2021 10:07:42 Jeremy [name] ([id number]) -------------------------------------------------------------------------------- Wound Assessment Details Patient Name: Jeremy [name] Date of Service: 11/16/2021 9:45 AM Medical Record Number: [id number] Patient Account Number: [id number] Date of Birth/Sex: 1956-02-23 (65  y.o. M) Treating RN: Yevonne Pax Primary Care Djuna Frechette: Barbette Reichmann Other Clinician: [name] Referring Kasiya Burck: Barbette Reichmann Treating Chauncey Sciulli/Extender: Rowan Blase in Treatment: 10 Wound Status Wound Number: 3 Primary Refractory Osteomyelitis Etiology: Wound Location: Right, Lateral Ankle Secondary Diabetic Wound/Ulcer of the Lower Extremity Wounding Event: Gradually Appeared Etiology: Date Acquired: 08/24/2021 Wound Open Weeks Of Treatment: 10 Status: Clustered Wound: No Comorbid Arrhythmia, Congestive Heart Failure, Coronary Artery Pending Amputation On Presentation History: Disease, Hypertension, Type II Diabetes, Osteoarthritis, Osteomyelitis Photos Wound Measurements Length: (cm) 0.6 Width: (cm) 0.3 Depth: (cm) 0.3 Area: (cm) 0.141 Volume: (cm) 0.042 % Reduction in Area: -354.8% % Reduction in Volume: -90.9% Epithelialization: Small (1-33%) Tunneling: No Undermining: No Wound Description Classification: Full Thickness With Exposed Support Structures Exudate Amount: Medium Exudate Type: Serous Exudate Color: amber Foul Odor After Cleansing: No Slough/Fibrino Yes Wound Bed Granulation Amount: Medium (34-66%) Exposed Structure Granulation Quality: Pink Fascia Exposed: No Necrotic Amount: Small (1-33%) Fat Layer (Subcutaneous Tissue) Exposed: Yes Tendon Exposed: No Muscle Exposed: No Joint Exposed: No Bone Exposed: Yes Treatment Notes Wound #3 (Ankle) Wound Laterality: Right, Lateral Cleanser Normal Saline Discharge Instruction: Wash your hands with soap and water. Remove old dressing, discard into plastic bag and place into trash. Cleanse the wound with Normal Saline prior to applying a clean dressing using gauze sponges, not tissues or cotton balls. Do not Skillin, Skyeler ([id number]) scrub or use excessive force. Pat dry using gauze sponges, not tissue or cotton balls. Peri-Wound Care Topical Primary Dressing Hydrofera Blue Ready Transfer Foam, 2.5x2.5 (in/in) Discharge Instruction: Apply Hydrofera Blue Ready to wound bed as directed Secondary  Dressing Coverlet Latex-Free Fabric Adhesive Dressings Discharge Instruction: 1.5 x 2 Secured With Compression Wrap Compression Stockings Add-Ons Electronic Signature(s) Signed: 11/16/2021 5:11:25 PM By: Yevonne Pax RN Signed: 11/17/2021 4:20:44 PM By: [name] Entered By: [name] on 11/16/2021 09:45:47 Jeremy [name] ([id number]) -------------------------------------------------------------------------------- Vitals Details Patient Name: Jeremy [name] Date of Service: 11/16/2021 9:45 AM Medical Record Number: [id number] Patient Account Number: [id number] Date of Birth/Sex: [birth year] (66 y.o. M) Treating RN: Yevonne Pax Primary Care Davyn Morandi: Barbette Reichmann Other Clinician: [name] Referring Hamilton Marinello: Barbette Reichmann Treating Semaje Kinker/Extender: Rowan Blase in Treatment: 10 Vital Signs Time Taken: 09:35 Temperature (F): 97.8 Height (in): 71 Pulse (bpm): 71 Weight (lbs): 250 Respiratory Rate (breaths/min): 18 Body Mass Index (BMI): 34.9 Blood Pressure (mmHg): 138/84 Reference Range: 80 - 120 mg / dl Electronic Signature(s) Signed: 11/17/2021 4:20:44 PM By: [name] Entered By: [name] on 11/16/2021 09:37:56

## 2021-11-30 ENCOUNTER — Encounter: Payer: Medicaid Other | Admitting: Physician Assistant

## 2021-11-30 DIAGNOSIS — M86471 Chronic osteomyelitis with draining sinus, right ankle and foot: Secondary | ICD-10-CM | POA: Diagnosis not present

## 2021-11-30 NOTE — Progress Notes (Addendum)
Jeremy, [name] ([id number]) Visit Report for 11/30/2021 Chief Complaint Document Details Patient Name: Jeremy, [name] Date of Service: 11/30/2021 9:00 AM Medical Record Number: [id number] Patient Account Number: [id number] Date of Birth/Sex: 07/19/1955 (65 y.o. M) Treating RN: Cornell Barman Primary Care Provider: Tracie Harrier Other Clinician: Massie Kluver Referring Provider: Tracie Harrier Treating Provider/Extender: Skipper Cliche in Treatment: 12 Information Obtained from: Patient Chief Complaint Right ankle ulcer Electronic Signature(s) Signed: 11/30/2021 9:25:34 AM By: Worthy Keeler PA-C Entered By: Worthy Keeler on 11/30/2021 09:25:34 Jeremy [name] ([id number]) -------------------------------------------------------------------------------- HPI Details Patient Name: Jeremy [name] Date of Service: 11/30/2021 9:00 AM Medical Record Number: [id number] Patient Account Number: [id number] Date of Birth/Sex: 07/18/1955 (65 y.o. M) Treating RN: Cornell Barman Primary Care Provider: Tracie Harrier Other Clinician: Massie Kluver Referring Provider: Tracie Harrier Treating Provider/Extender: Skipper Cliche in Treatment: 12 History of Present Illness HPI Description: 08/18/2020 upon evaluation today patient presents for initial inspection here in our clinic concerning issues has been having with his right lateral ankle and this has been present for at least a year he tells me. He is a patient of Dr. Elvina Mattes. Subsequently Dr. Elvina Mattes has since retired hence the patient look this up and is coming here for wound care to see if we can help him out. He did have a screw pushed out of this location and since that time tells me that the screw was removed but nonetheless the hole has remained. Fortunately there does not appear to be any signs of active infection systemically at this point though it does raise the question as to whether or not this might be a  bone/hardware infection being that this has been open for so long. The patient does have a history of diabetes mellitus type 2, coronary artery disease, hypertension, and unfortunately does not seem to be doing as great as I would like to see him regard to his left ankle. 08/25/2020 upon evaluation today patient appears to be doing well with regard to his ankle ulcer compared to last week this is definitely smaller. With that being said it still does probe down to bone/hardware. I still think this can be appropriate for him to see the orthopedic specialist. He has not heard from them yet he tells me. 09/05/2020 upon evaluation today patient appears to be doing extremely well in regard to his wound all things considered. I do not see that this is gotten any worse. Unfortunately also do not think he is gotten any better. We did make a referral to orthopedics to Dr. Doran Durand specifically but the patient tells me that he has not heard from them. That is unusual as they normally get in touch with patients fairly quickly. I will ask him if he possibly missed a phone call he tells me he will check when he gets home. Nonetheless he does not want to go to [facility] anyway which he did not tell me previously therefore we will get a see about making a referral to Dr. Amalia Hailey to see what he potentially could recommend for the patient as well I think Dr. Amalia Hailey is excellent and a good option here. Readmission: 10/28/2020 upon evaluation today patient appears to be doing about the same as when I last saw him. He did not come back for follow-up last time I saw him was 09/05/2020. Basically he tells me that he has been attempting to manage this on his own using the Hydrofera Blue rope. He did get a call from [facility] but they  did not actually get him scheduled due to the fact that the patient told me that he told them he did not wanted to see them he just wanted to come back and see me because he was happy with my  care. Nonetheless as I explained to the patient he has a fractured screw at the site of the wound he also has another screw where there appears to be some loosening of the screw and potential for hardware/bone infection here. Subsequently I think he really does need to see a specialist to see what they can do to help him out I do not believe him to be able to get this healed without taking care of the hardware issues. 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 He has been advised previous of a need for an above-knee amputation due to some of the aggressive breakdown in the ankle region. Nonetheless he has had the removal of the screw performed in office with Dr. Luana Shu and this was on 08/28/2021. Subsequently the patient is a poor historian he also gets upset very easily. Of note I do believe that he is going to require some dressings to be packed into the area due to the fact that to be honest this is still tracking all the way straight down to bone where there was pus and purulence coming out once I did get down to that area. Subsequently he tells me that he is having pain although it does seem to be doing better since the screw was removed. 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 Therefore his best options probably do entail the continue following up by Dr. Ola Spurr for infectious disease coupled with wound care as best we can and try to see if we keep this clean as much as possible and keep it from becoming more infected. At some point there may be a time where this could continually get worse and may end up not doing nearly as well but right now he does not seem to be doing too poorly and I think this is the best way to support him. I am not overly  confident that this is ever getting heal however. 09/15/21 upon evaluation today patient appears to be doing well with regard to his wound. He has been tolerating the dressing changes without complication. Fortunately I do not see any signs of active infection at this time. Overall I think that he is doing quite well. 09-22-2021 upon evaluation today patient appears to be doing about the same in regard to his ankle. 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 He again has been told that there is probably not a likely scenario in which this is able to heal. Nonetheless he is not a HBO candidate due to low ejection fraction. He does see Dr. Ola Spurr he still taking antibiotics at this point we will try to keep the area open so does not develop an abscess. 09-29-2021 upon evaluation today patient appears to be doing well with regard to his wound all things considered. He continues to use the [employer] rope. This is still a fairly deep wound that I think the rope is doing well to help  keep the drainage from collecting in the base of the wound there does appear to be less drainage definitely not as purulent as what we have noticed in the past. There is no increased pain and no signs of worsening in general. 10-06-2021 upon evaluation today patient's wound is continuing to show signs of issues here at this point. Fortunately I do not see any evidence of active infection locally or systemically which is great news but at the same time he is definitely having some ongoing problems here currently. 5/8; 2-week follow-up. Right medial ankle. Small wound purulent drainage. He is on doxycycline chronically for underlying osteomyelitis. He has hardware in this area. He is using Hydrofera Blue packing strips 11-02-2021 upon evaluation today patient appears to be doing much better in regard to his wound I am very pleased to see this.  I do not see any evidence of active infection locally nor systemically which is great news. No fevers, chills, nausea, vomiting, or diarrhea. Jeremy [name], Jeremy [name] ([id number]) 11-16-2021 upon evaluation today patient appears to be doing well with regard to his ankle ulcer this is a very tiny area remaining at this point. Fortunately I do not see any evidence of infection locally or systemically at this time. Overall I think we are on the right track. 11-30-2021 Upon inspection patient's wound bed actually showed signs of having just a very small opening that still between 1 to 2 mm max. The back end of the sterile Q-tip stick I was able to get down in here and it does still probe down to bone. With that being said he still has drainage as well. Nonetheless he has not wanted any further surgical intervention and so mainly is just more of something were doing to try to manage this as best we can do only thing really were doing a split Hydrofera Blue externally has a lot of new skin covering over where previously had a lot of open area but at the same time I am still concerned about the fact this probes all the way down to bone. Electronic Signature(s) Signed: 11/30/2021 9:31:31 AM By: Worthy Keeler PA-C Entered By: Worthy Keeler on 11/30/2021 09:31:31 Jeremy [name] ([id number]) -------------------------------------------------------------------------------- Physical Exam Details Patient Name: Jeremy [name] Date of Service: 11/30/2021 9:00 AM Medical Record Number: [id number] Patient Account Number: [id number] Date of Birth/Sex: 1956/05/18 (65 y.o. M) Treating RN: Cornell Barman Primary Care Provider: Tracie Harrier Other Clinician: Massie Kluver Referring Provider: Tracie Harrier Treating Provider/Extender: Skipper Cliche in Treatment: 63 Constitutional Well-nourished and well-hydrated in no acute distress. Respiratory normal breathing without difficulty. Psychiatric this patient is  able to make decisions and demonstrates good insight into disease process. Alert and Oriented x 3. pleasant and cooperative. Notes Upon inspection patient's wound again probes down to bone there is just a very tiny opening nothing major here but I explained to the patient this still means that is still draining and that the bone is likely still infected. With that being said he does not want to do anything further as far as anything aggressive is concerned therefore mainly just managing this as best we can here in the clinic. Electronic Signature(s) Signed: 11/30/2021 9:32:03 AM By: Worthy Keeler PA-C Entered By: Worthy Keeler on 11/30/2021 09:32:03 Jeremy [name] ([id number]) -------------------------------------------------------------------------------- Physician Orders Details Patient Name: Jeremy [name] Date of Service: 11/30/2021 9:00 AM Medical Record Number: [id number] Patient Account Number: [id number] Date of Birth/Sex: [birth year] (65 y.o. 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 Bathing/ Shower/ Hygiene o May shower; gently cleanse wound with antibacterial soap, rinse and pat dry prior to dressing wounds o No tub bath. Edema Control - Lymphedema / Segmental Compressive Device / Other o Elevate leg(s) parallel to the floor when  sitting. o DO YOUR BEST to sleep in the bed at night. DO NOT sleep in your recliner. Long hours of sitting in a recliner leads to swelling of the legs and/or potential wounds on your backside. Additional Orders / Instructions o Follow Nutritious Diet and Increase Protein Intake - monitor blood glucose to maintain normal level Wound Treatment Wound #3 - Ankle Wound Laterality: Right, Lateral Cleanser: Normal Saline 1 x Per Day/30 Days Discharge Instructions: Wash your hands with soap and water. Remove old dressing, discard into plastic bag and place into trash. Cleanse the wound with Normal Saline prior to applying a clean dressing using gauze sponges, not tissues or cotton balls. Do not scrub or use excessive force. Pat dry using gauze sponges, not tissue or cotton balls. Primary Dressing: Hydrofera Blue Ready Transfer Foam, 2.5x2.5 (in/in) 1 x Per Day/30 Days Discharge Instructions: Apply Hydrofera Blue Ready to wound bed as directed Secondary Dressing: Coverlet Latex-Free Fabric Adhesive Dressings 1 x Per Day/30 Days Discharge Instructions: 1.5 x 2 Electronic Signature(s) Unsigned Entered By: [name] on 11/30/2021 09:32:25 Signature(s): Date(s): Jeremy [name] ([id number]) -------------------------------------------------------------------------------- Problem List Details Patient Name: Jeremy [name], Jeremy [name] Date of Service: 11/30/2021 9:00 AM Medical Record Number: [id number] Patient Account Number: [id number] Date of Birth/Sex: 02-16-1956 (65 y.o. M) Treating RN: Huel Coventry Primary Care Provider: Barbette Reichmann Other Clinician: [name] Referring Provider: Barbette Reichmann Treating Provider/Extender: Rowan Blase in Treatment: 12 Active Problems ICD-10 Encounter Code Description Active Date MDM Diagnosis M86.471 Chronic osteomyelitis with draining sinus, right ankle and foot 09/07/2021 No Yes L97.316 Non-pressure chronic ulcer of right ankle with bone  involvement without 09/07/2021 No Yes evidence of necrosis E11.622 Type 2 diabetes mellitus with other skin ulcer 09/07/2021 No Yes I10 Essential (primary) hypertension 09/07/2021 No Yes I25.10 Atherosclerotic heart disease of native coronary artery without angina 09/07/2021 No Yes pectoris Inactive Problems Resolved Problems Electronic Signature(s) Signed: 11/30/2021 9:25:30 AM By: Lenda Kelp PA-C Entered By: Lenda Kelp on 11/30/2021 09:25:30 Jeremy [name] ([id number]) -------------------------------------------------------------------------------- Progress Note Details Patient Name: Jeremy [name] Date of Service: 11/30/2021 9:00 AM Medical Record Number: [id number] Patient Account Number: [id number] Date of Birth/Sex: [birth year] (65 y.o. M) Treating RN: Huel Coventry Primary Care Provider: Barbette Reichmann Other Clinician: [name] Referring Provider: Barbette Reichmann Treating Provider/Extender: Rowan Blase in Treatment: 12 Subjective Chief Complaint Information obtained from Patient Right ankle ulcer History of Present Illness (HPI) 08/18/2020 upon evaluation today patient presents for initial inspection here in our clinic concerning issues has been having with his right lateral ankle and this has been present for at least a year he tells me. He is a patient of Dr. Orland Jarred. Subsequently Dr. Orland Jarred has since retired hence the patient look this up and is coming here for wound care  to see if we can help him out. He did have a screw pushed out of this location and since that time tells me that the screw was removed but nonetheless the hole has remained. Fortunately there does not appear to be any signs of active infection systemically at this point though it does raise the question as to whether or not this might be a bone/hardware infection being that this has been open for so long. The patient does have a history of diabetes mellitus type 2, coronary artery  disease, hypertension, and unfortunately does not seem to be doing as great as I would like to see him regard to his left ankle. 08/25/2020 upon evaluation today patient appears to be doing well with regard to his ankle ulcer compared to last week this is definitely smaller. With that being said it still does probe down to bone/hardware. I still think this can be appropriate for him to see the orthopedic specialist. He has not heard from them yet he tells me. 09/05/2020 upon evaluation today patient appears to be doing extremely well in regard to his wound all things considered. I do not see that this is gotten any worse. Unfortunately also do not think he is gotten any better. We did make a referral to orthopedics to Dr. Doran Durand specifically but the patient tells me that he has not heard from them. That is unusual as they normally get in touch with patients fairly quickly. I will ask him if he possibly missed a phone call he tells me he will check when he gets home. Nonetheless he does not want to go to [facility] anyway which he did not tell me previously therefore we will get a see about making a referral to Dr. Amalia Hailey to see what he potentially could recommend for the patient as well I think Dr. Amalia Hailey is excellent and a good option here. Readmission: 10/28/2020 upon evaluation today patient appears to be doing about the same as when I last saw him. He did not come back for follow-up last time I saw him was 09/05/2020. Basically he tells me that he has been attempting to manage this on his own using the Hydrofera Blue rope. He did get a call from [facility] but they did not actually get him scheduled due to the fact that the patient told me that he told them he did not wanted to see them he just wanted to come back and see me because he was happy with my care. Nonetheless as I explained to the patient he has a fractured screw at the site of the wound he also has another screw where there appears  to be some loosening of the screw and potential for hardware/bone infection here. Subsequently I think he really does need to see a specialist to see what they can do to help him out I do not believe him to be able to get this healed without taking care of the hardware issues. 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 He has been advised previous of a need for an above-knee amputation due to some of the aggressive breakdown in the ankle region. Nonetheless he has had the removal of the screw performed in office with Dr. Luana Shu and this was on 08/28/2021. Subsequently the patient is a poor historian he also gets upset very easily. Of note I do believe that he is going to require some  dressings to be packed into the area due to the fact that to be honest this is still tracking all the way straight down to bone where there was pus and purulence coming out once I did get down to that area. Subsequently he tells me that he is having pain although it does seem to be doing better since the screw was removed. 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 Therefore his best options probably do entail the continue following up by Dr. Sampson Goon for infectious disease coupled with wound care as best we can and try to see if we keep this clean as much as possible and keep it from becoming more infected. At some point there may be a time where this could continually get worse and may end up not doing nearly as well but right now he does not seem to be doing too poorly and I think this is the best way to support him. I am not overly confident that this is ever getting heal however. 09/15/21 upon evaluation today patient appears to be doing well with regard to his wound. He has  been tolerating the dressing changes without complication. Fortunately I do not see any signs of active infection at this time. Overall I think that he is doing quite well. 09-22-2021 upon evaluation today patient appears to be doing about the same in regard to his ankle. 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 He again has been told that there is probably not a likely scenario in which this is able to heal. Nonetheless he is not a HBO candidate due to low ejection fraction. He does see Dr. Sampson Goon he still taking antibiotics at this point we will try to keep the area open so does not develop an abscess. 09-29-2021 upon evaluation today patient appears to be doing well with regard to his wound all things considered. He continues to use the [employer] rope. This is still a fairly deep wound that I think the rope is doing well to help keep the drainage from collecting in the base of the wound there does appear to be less drainage definitely not as purulent as what we have noticed in the past. There is no increased pain and no signs of worsening in general. 10-06-2021 upon evaluation today patient's wound is continuing to show signs of issues here at this point. Fortunately I do not see any evidence of active infection locally or systemically which is great news but at the same time he is definitely having some ongoing problems here currently. 5/8; 2-week follow-up. Right medial ankle. Small wound purulent drainage. He is on doxycycline chronically for underlying osteomyelitis. He has [employer] ([id number]) hardware in this area. He is using Hydrofera Blue packing strips 11-02-2021 upon evaluation today patient appears to be doing much better in regard to his wound I am very pleased to see this. I do not see any evidence of active infection locally nor systemically which is great news. No fevers, chills,  nausea, vomiting, or diarrhea. 11-16-2021 upon evaluation today patient appears to be doing well with regard to his ankle ulcer this is a very tiny area remaining at this point. Fortunately I do not see any evidence of infection locally or systemically at this time. Overall  I think we are on the right track. 11-30-2021 Upon inspection patient's wound bed actually showed signs of having just a very small opening that still between 1 to 2 mm max. The back end of the sterile Q-tip stick I was able to get down in here and it does still probe down to bone. With that being said he still has drainage as well. Nonetheless he has not wanted any further surgical intervention and so mainly is just more of something were doing to try to manage this as best we can do only thing really were doing a split Hydrofera Blue externally has a lot of new skin covering over where previously had a lot of open area but at the same time I am still concerned about the fact this probes all the way down to bone. Objective Constitutional Well-nourished and well-hydrated in no acute distress. Vitals Time Taken: 9:12 AM, Height: 71 in, Weight: 250 lbs, BMI: 34.9, Temperature: 97.8 F, Pulse: 134 bpm, Respiratory Rate: 18 breaths/min, Blood Pressure: 150/93 mmHg. Respiratory normal breathing without difficulty. Psychiatric this patient is able to make decisions and demonstrates good insight into disease process. Alert and Oriented x 3. pleasant and cooperative. General Notes: Upon inspection patient's wound again probes down to bone there is just a very tiny opening nothing major here but I explained to the patient this still means that is still draining and that the bone is likely still infected. With that being said he does not want to do anything further as far as anything aggressive is concerned therefore mainly just managing this as best we can here in the clinic. Integumentary (Hair, Skin) Wound #3 status is Open. Original  cause of wound was Gradually Appeared. The date acquired was: 08/24/2021. The wound has been in treatment 12 weeks. The wound is located on the Right,Lateral Ankle. The wound measures 0.6cm length x 0.52cm width x 0.3cm depth; 0.245cm^2 area and 0.074cm^3 volume. There is bone and Fat Layer (Subcutaneous Tissue) exposed. There is no tunneling or undermining noted. There is a medium amount of serous drainage noted. There is medium (34-66%) pink granulation within the wound bed. There is a small (1-33%) amount of necrotic tissue within the wound bed including Adherent Slough. 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 I Minna suggest that we continue with the [facility] externally this does seem to be The Drainage Unfortunately She Is Not Able to Be Packed in Any Further. 2. I Am Also Can Recommend That We Have the Patient Continue to Monitor for Any Signs of Worsening or Infection If Anything Changes He Should Let Me Know Otherwise We Will Continue to See How Things Do and I Will Plan to See Him Back in 2 Weeks. Jeremy [name], Jeremy [name] ([id number]) We will see patient back for reevaluation in 2 weeks here in the clinic. If anything worsens or changes patient will contact our office for additional recommendations. Electronic Signature(s) Signed: 11/30/2021 9:32:34 AM By: Worthy Keeler PA-C Entered By: Worthy Keeler on 11/30/2021 09:32:34 Jeremy [name] ([id number]) -------------------------------------------------------------------------------- SuperBill Details Patient Name: Jeremy [name] Date of Service: 11/30/2021 Medical Record Number: [id number] Patient Account Number:  [id number] Date of Birth/Sex: Sep 19, 1955 (65 y.o. 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 E11.622 Type 2 diabetes mellitus with other skin ulcer I10 Essential (primary) hypertension I25.10 Atherosclerotic heart disease of native coronary artery without angina pectoris Facility Procedures CPT4 Code: [id number] Description: 99213 - WOUND CARE VISIT-LEV 3 EST PT Modifier: Quantity: 1 Physician Procedures CPT4 Code Description: [id number] - WC PHYS LEVEL 3 - EST PT Modifier: Quantity: 1 CPT4 Code Description: ICD-10 Diagnosis Description M86.471 Chronic osteomyelitis with draining sinus, 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

## 2021-12-01 NOTE — Progress Notes (Signed)
CLARA, SMOLEN ([id number]) Visit Report for 11/30/2021 Arrival Information Details Patient Name: Jeremy [name], Jeremy [name] Date of Service: 11/30/2021 9:00 AM Medical Record Number: [id number] Patient Account Number: [id number] Date of Birth/Sex: [birth year] (66 y.o. M) Treating RN: Huel Coventry Primary Care Sabirin Baray: Barbette Reichmann Other Clinician: [name] Referring Lloyd Cullinan: Barbette Reichmann Treating Nikoloz Huy/Extender: Rowan Blase in Treatment: 12 Visit Information History Since Last Visit Added or deleted any medications: No Patient Arrived: Ambulatory Any new allergies or adverse reactions: No Arrival Time: 09:08 Had a fall or experienced change in No Transfer Assistance: None activities of daily living that may affect Patient Requires Transmission-Based No risk of falls: Precautions: Hospitalized since last visit: No Patient Has Alerts: Yes Pain Present Now: No Patient Alerts: Patient on Blood Thinner Eliquis Electronic Signature(s) Signed: 11/30/2021 3:03:11 PM By: [name] Entered By: [name] on 11/30/2021 09:12:43 Jeremy [name] ([id number]) -------------------------------------------------------------------------------- Clinic Level of Care Assessment Details Patient Name: Jeremy [name] Date of Service: 11/30/2021 9:00 AM Medical Record Number: [id number] Patient Account Number: [id number] Date of Birth/Sex: 07-31-1955 (66 y.o. M) Treating RN: Huel Coventry Primary Care Saverio Kader: Barbette Reichmann Other Clinician: [name] Referring Nichele Slawson: Barbette Reichmann Treating Trinidi Toppins/Extender: Rowan Blase in Treatment: 12 Clinic Level of Care Assessment Items TOOL 4 Quantity Score []  - Use when only an EandM is performed on FOLLOW-UP visit 0 ASSESSMENTS - Nursing Assessment / Reassessment X - Reassessment of Co-morbidities (includes updates in patient status) 1 10 X- 1 5 Reassessment of Adherence to Treatment Plan ASSESSMENTS - Wound  and Skin Assessment / Reassessment X - Simple Wound Assessment / Reassessment - one wound 1 5 []  - 0 Complex Wound Assessment / Reassessment - multiple wounds []  - 0 Dermatologic / Skin Assessment (not related to wound area) ASSESSMENTS - Focused Assessment []  - Circumferential Edema Measurements - multi extremities 0 []  - 0 Nutritional Assessment / Counseling / Intervention []  - 0 Lower Extremity Assessment (monofilament, tuning fork, pulses) []  - 0 Peripheral Arterial Disease Assessment (using hand held doppler) ASSESSMENTS - Ostomy and/or Continence Assessment and Care []  - Incontinence Assessment and Management 0 []  - 0 Ostomy Care Assessment and Management (repouching, etc.) PROCESS - Coordination of Care X - Simple Patient / Family Education for ongoing care 1 15 []  - 0 Complex (extensive) Patient / Family Education for ongoing care X- 1 10 Staff obtains Consents, Records, Test Results / Process Orders []  - 0 Staff telephones HHA, Nursing Homes / Clarify orders / etc []  - 0 Routine Transfer to another Facility (non-emergent condition) []  - 0 Routine Hospital Admission (non-emergent condition) []  - 0 New Admissions / / Ordering NPWT, Apligraf, etc. []  - 0 Emergency Hospital Admission (emergent condition) X- 1 10 Simple Discharge Coordination []  - 0 Complex (extensive) Discharge Coordination PROCESS - Special Needs []  - Pediatric / Minor Patient Management 0 []  - 0 Isolation Patient Management []  - 0 Hearing / Language / Visual special needs []  - 0 Assessment of Community assistance (transportation, D/C planning, etc.) []  - 0 Additional assistance / Altered mentation []  - 0 Support Surface(s) Assessment (bed, cushion, seat, etc.) INTERVENTIONS - Wound Cleansing / Measurement Borden, Alixander ( ) X- 1 5 Simple Wound Cleansing - one wound []  - 0 Complex Wound Cleansing - multiple wounds X- 1 5 Wound Imaging (photographs - any  number of wounds) []  - 0 Wound Tracing (instead of photographs) X- 1 5 Simple Wound Measurement - one wound []  - 0 Complex Wound Measurement - multiple wounds INTERVENTIONS - Wound  Dressings []  - Small Wound Dressing one or multiple wounds 0 X- 1 15 Medium Wound Dressing one or multiple wounds []  - 0 Large Wound Dressing one or multiple wounds []  - 0 Application of Medications - topical []  - 0 Application of Medications - injection INTERVENTIONS - Miscellaneous []  - External ear exam 0 []  - 0 Specimen Collection (cultures, biopsies, blood, body fluids, etc.) []  - 0 Specimen(s) / Culture(s) sent or taken to Lab for analysis []  - 0 Patient Transfer (multiple staff / Lift / Similar devices) []  - 0 Simple Staple / Suture removal (25 or less) []  - 0 Complex Staple / Suture removal (26 or more) []  - 0 Hypo / Hyperglycemic Management (close monitor of Blood Glucose) []  - 0 Ankle / Brachial Index (ABI) - do not check if billed separately X- 1 5 Vital Signs Has the patient been seen at the hospital within the last three years: Yes Total Score: 90 Level Of Care: New/Established - Level 3 Electronic Signature(s) Signed: 11/30/2021 3:03:11 PM By: Entered By: on 11/30/2021 09:34:00 ( ) -------------------------------------------------------------------------------- Encounter Discharge Information Details Patient Name: Date of Service: 11/30/2021 9:00 AM Medical Record Number: Patient Account Number: Date of Birth/Sex: [birth year] (66 y.o. M) Treating RN: 12/02/2021 Primary Care Terrall Bley: [name] Other Clinician: [name] Referring Runette Scifres: 12/02/2021 Treating Marselino Slayton/Extender: Jeremy [name] in Treatment: 12 Encounter Discharge Information Items Discharge Condition: Stable Ambulatory Status: Ambulatory Discharge Destination: Home Transportation:  Private Auto Accompanied By: self Schedule Follow-up Appointment: Yes Clinical Summary of Care: Electronic Signature(s) Signed: 11/30/2021 3:03:11 PM By: Jeremy [name] Entered By: 12/02/2021 on 11/30/2021 09:35:52 [id number] (06/09/1956) -------------------------------------------------------------------------------- Lower Extremity Assessment Details Patient Name: 09-07-1989 Date of Service: 11/30/2021 9:00 AM Medical Record Number: Barbette Reichmann Patient Account Number: [name] Date of Birth/Sex: June 24, 1955 (65 y.o. M) Treating RN: 12/02/2021 Primary Care Densel Kronick: [name] Other Clinician: [name] Referring Marvine Encalade: 12/02/2021 Treating Luther Newhouse/Extender: Jeremy [name] in Treatment: 12 Electronic Signature(s) Signed: 11/30/2021 3:03:11 PM By: Jeremy [name] Signed: 12/01/2021 12:21:47 PM By: [id number] BSN, RN, CWS, Kim RN, BSN Entered By: [id number] on 11/30/2021 09:22:57 09-07-1989 (Huel Coventry) -------------------------------------------------------------------------------- Multi Wound Chart Details Patient Name: Barbette Reichmann Date of Service: 11/30/2021 9:00 AM Medical Record Number: Barbette Reichmann Patient Account Number: Rowan Blase Date of Birth/Sex: [birth year] (65 y.o. M) Treating RN: 12/03/2021 Primary Care Adriaan Maltese: Elliot Gurney Other Clinician: [name] Referring Zeki Bedrosian: 12/02/2021 Treating Neleh Muldoon/Extender: Jeremy [name] in Treatment: 12 Vital Signs Height(in): 71 Pulse(bpm): 134 Weight(lbs): 250 Blood Pressure(mmHg): 150/93 Body Mass Index(BMI): 34.9 Temperature(F): 97.8 Respiratory Rate(breaths/min): 18 Photos: [N/A:N/A] Wound Location: Right, Lateral Ankle N/A N/A Wounding Event: Gradually Appeared N/A N/A Primary Etiology: Refractory Osteomyelitis N/A N/A Secondary Etiology: Diabetic Wound/Ulcer of the Lower N/A N/A Extremity Comorbid History: Arrhythmia, Congestive Heart N/A  N/A Failure, Coronary Artery Disease, Hypertension, Type II Diabetes, Osteoarthritis, Osteomyelitis Date Acquired: 08/24/2021 N/A N/A Weeks of Treatment: 12 N/A N/A Wound Status: Open N/A N/A Wound Recurrence: No N/A N/A Pending Amputation on Yes N/A N/A Presentation: Measurements L x W x D (cm) 0.6x0.52x0.3 N/A N/A Area (cm) : 0.245 N/A N/A Volume (cm) : 0.074 N/A N/A % Reduction in Area: -690.30% N/A N/A % Reduction in Volume: -236.40% N/A N/A Classification: Full Thickness With Exposed N/A N/A Support Structures Exudate Amount: Medium N/A N/A Exudate Type: Serous N/A N/A Exudate Color: amber N/A N/A Granulation Amount: Medium (34-66%) N/A N/A Granulation Quality: Pink N/A  N/A Necrotic Amount: Small (1-33%) N/A N/A Exposed Structures: Fat Layer (Subcutaneous Tissue): N/A N/A Yes Bone: Yes Fascia: No Tendon: No Muscle: No Joint: No Epithelialization: Small (1-33%) N/A N/A Treatment Notes Electronic KEYSHUN, ELPERS ([id number]) Signed: 11/30/2021 3:03:11 PM By: [name] Entered By: [name] on 11/30/2021 09:23:42 Jeremy [name] ([id number]) -------------------------------------------------------------------------------- Multi-Disciplinary Care Plan Details Patient Name: Jeremy [name] Date of Service: 11/30/2021 9:00 AM Medical Record Number: [id number] Patient Account Number: [id number] Date of Birth/Sex: 11-09-1955 (65 y.o. M) Treating RN: Huel Coventry Primary Care Karren Newland: Barbette Reichmann Other Clinician: [name] Referring Casanova Schurman: Barbette Reichmann Treating Sabian Kuba/Extender: Rowan Blase in Treatment: 12 Active Inactive Osteomyelitis Nursing Diagnoses: Knowledge deficit related to disease process and management Potential for infection: osteomyelitis Goals: Patient/caregiver will verbalize understanding of disease process and disease management Date Initiated: 09/07/2021 Target Resolution Date: 09/18/2021 Goal Status:  Active Interventions: Assess for signs and symptoms of osteomyelitis resolution every visit Provide education on osteomyelitis Notes: Wound/Skin Impairment Nursing Diagnoses: Impaired tissue integrity Knowledge deficit related to smoking impact on wound healing Knowledge deficit related to ulceration/compromised skin integrity Goals: Patient will demonstrate a reduced rate of smoking or cessation of smoking Date Initiated: 09/07/2021 Target Resolution Date: 10/16/2021 Goal Status: Active Patient/caregiver will verbalize understanding of skin care regimen Date Initiated: 09/07/2021 Target Resolution Date: 09/18/2021 Goal Status: Active Ulcer/skin breakdown will have a volume reduction of 30% by week 4 Date Initiated: 09/07/2021 Target Resolution Date: 10/05/2021 Goal Status: Active Ulcer/skin breakdown will have a volume reduction of 50% by week 8 Date Initiated: 09/07/2021 Target Resolution Date: 11/02/2021 Goal Status: Active Ulcer/skin breakdown will have a volume reduction of 80% by week 12 Date Initiated: 09/07/2021 Target Resolution Date: 11/30/2021 Goal Status: Active Ulcer/skin breakdown will heal within 14 weeks Date Initiated: 09/07/2021 Target Resolution Date: 12/14/2021 Goal Status: Active Interventions: Assess patient/caregiver ability to obtain necessary supplies Assess patient/caregiver ability to perform ulcer/skin care regimen upon admission and as needed Assess ulceration(s) every visit Notes: Electronic Signature(s) JEWELZ, KOBUS ([id number]) Signed: 11/30/2021 3:03:11 PM By: [name] Signed: 12/01/2021 12:21:47 PM By: Elliot Gurney, BSN, RN, CWS, Kim RN, BSN Entered By: [name] on 11/30/2021 09:23:01 Jeremy [name] ([id number]) -------------------------------------------------------------------------------- Pain Assessment Details Patient Name: Jeremy [name] Date of Service: 11/30/2021 9:00 AM Medical Record Number: [id number] Patient Account Number:  [id number] Date of Birth/Sex: 1956/03/24 (65 y.o. M) Treating RN: Huel Coventry Primary Care Tobias Avitabile: Barbette Reichmann Other Clinician: [name] Referring Biddie Sebek: Barbette Reichmann Treating Hailyn Zarr/Extender: Rowan Blase in Treatment: 12 Active Problems Location of Pain Severity and Description of Pain Patient Has Paino No Site Locations Pain Management and Medication Current Pain Management: Electronic Signature(s) Signed: 11/30/2021 3:03:11 PM By: [name] Signed: 12/01/2021 12:21:47 PM By: Elliot Gurney, BSN, RN, CWS, Kim RN, BSN Entered By: [name] on 11/30/2021 09:16:54 Jeremy [name] ([id number]) -------------------------------------------------------------------------------- Patient/Caregiver Education Details Patient Name: Jeremy [name] Date of Service: 11/30/2021 9:00 AM Medical Record Number: [id number] Patient Account Number: [id number] Date of Birth/Gender: 07-Apr-1956 (65 y.o. M) Treating RN: Huel Coventry Primary Care Physician: Barbette Reichmann Other Clinician: [name] Referring Physician: Barbette Reichmann Treating Physician/Extender: Rowan Blase in Treatment: 12 Education Assessment Education Provided To: Patient Education Topics Provided Infection: Handouts: Infection Prevention and Management Methods: Explain/Verbal Responses: State content correctly Electronic Signature(s) Signed: 11/30/2021 3:03:11 PM By: [name] Entered By: [name] on 11/30/2021 09:35:00 Jeremy [name] ([id number]) -------------------------------------------------------------------------------- Wound Assessment Details Patient Name: Jeremy [name] Date of Service: 11/30/2021 9:00 AM Medical Record Number: [id number] Patient Account Number: [id number] Date of Birth/Sex:  Nov 06, 1955 (66 y.o. M) Treating RN: Huel Coventry Primary Care Maalle Starrett: Barbette Reichmann Other Clinician: [name] Referring Amore Ackman: Barbette Reichmann Treating  Maison Agrusa/Extender: Rowan Blase in Treatment: 12 Wound Status Wound Number: 3 Primary Refractory Osteomyelitis Etiology: Wound Location: Right, Lateral Ankle Secondary Diabetic Wound/Ulcer of the Lower Extremity Wounding Event: Gradually Appeared Etiology: Date Acquired: 08/24/2021 Wound Open Weeks Of Treatment: 12 Status: Clustered Wound: No Comorbid Arrhythmia, Congestive Heart Failure, Coronary Artery Pending Amputation On Presentation History: Disease, Hypertension, Type II Diabetes, Osteoarthritis, Osteomyelitis Photos Wound Measurements Length: (cm) 0.6 Width: (cm) 0.52 Depth: (cm) 0.3 Area: (cm) 0.245 Volume: (cm) 0.074 % Reduction in Area: -690.3% % Reduction in Volume: -236.4% Epithelialization: Small (1-33%) Tunneling: No Undermining: No Wound Description Classification: Full Thickness With Exposed Support Structures Exudate Amount: Medium Exudate Type: Serous Exudate Color: amber Foul Odor After Cleansing: No Slough/Fibrino Yes Wound Bed Granulation Amount: Medium (34-66%) Exposed Structure Granulation Quality: Pink Fascia Exposed: No Necrotic Amount: Small (1-33%) Fat Layer (Subcutaneous Tissue) Exposed: Yes Necrotic Quality: Adherent Slough Tendon Exposed: No Muscle Exposed: No Joint Exposed: No Bone Exposed: Yes Treatment Notes Wound #3 (Ankle) Wound Laterality: Right, Lateral Cleanser Normal Saline Discharge Instruction: Wash your hands with soap and water. Remove old dressing, discard into plastic bag and place into trash. Cleanse the wound with Normal Saline prior to applying a clean dressing using gauze sponges, not tissues or cotton balls. Do not [place], Basir ([id number]) scrub or use excessive force. Pat dry using gauze sponges, not tissue or cotton balls. Peri-Wound Care Topical Primary Dressing Hydrofera Blue Ready Transfer Foam, 2.5x2.5 (in/in) Discharge Instruction: Apply Hydrofera Blue Ready to wound bed as  directed Secondary Dressing Coverlet Latex-Free Fabric Adhesive Dressings Discharge Instruction: 1.5 x 2 Secured With Compression Wrap Compression Stockings Add-Ons Electronic Signature(s) Signed: 11/30/2021 3:03:11 PM By: [name] Signed: 12/01/2021 12:21:47 PM By: Elliot Gurney, BSN, RN, CWS, Kim RN, BSN Entered By: [name] on 11/30/2021 09:22:39 Jeremy [name] ([id number]) -------------------------------------------------------------------------------- Vitals Details Patient Name: Jeremy [name] Date of Service: 11/30/2021 9:00 AM Medical Record Number: [id number] Patient Account Number: [id number] Date of Birth/Sex: [birth year] (65 y.o. M) Treating RN: Huel Coventry Primary Care Kyro Joswick: Barbette Reichmann Other Clinician: [name] Referring Shakti Fleer: Barbette Reichmann Treating Damontre Millea/Extender: Rowan Blase in Treatment: 12 Vital Signs Time Taken: 09:12 Temperature (F): 97.8 Height (in): 71 Pulse (bpm): 134 Weight (lbs): 250 Respiratory Rate (breaths/min): 18 Body Mass Index (BMI): 34.9 Blood Pressure (mmHg): 150/93 Reference Range: 80 - 120 mg / dl Electronic Signature(s) Signed: 11/30/2021 3:03:11 PM By: [name] Entered By: [name] on 11/30/2021 09:16:40

## 2021-12-14 ENCOUNTER — Other Ambulatory Visit
Admission: RE | Admit: 2021-12-14 | Discharge: 2021-12-14 | Disposition: A | Discharge status: Home / Self Care | Payer: Medicaid Other | Source: Ambulatory Visit | Attending: Physician Assistant | Admitting: Physician Assistant

## 2021-12-14 ENCOUNTER — Encounter: Payer: Medicaid Other | Attending: Physician Assistant | Admitting: Physician Assistant

## 2021-12-14 DIAGNOSIS — L97316 Non-pressure chronic ulcer of right ankle with bone involvement without evidence of necrosis: Secondary | ICD-10-CM | POA: Insufficient documentation

## 2021-12-14 DIAGNOSIS — I10 Essential (primary) hypertension: Secondary | ICD-10-CM | POA: Diagnosis not present

## 2021-12-14 DIAGNOSIS — E11622 Type 2 diabetes mellitus with other skin ulcer: Secondary | ICD-10-CM | POA: Diagnosis not present

## 2021-12-14 DIAGNOSIS — I2510 Atherosclerotic heart disease of native coronary artery without angina pectoris: Secondary | ICD-10-CM | POA: Diagnosis not present

## 2021-12-14 DIAGNOSIS — M86471 Chronic osteomyelitis with draining sinus, right ankle and foot: Secondary | ICD-10-CM | POA: Insufficient documentation

## 2021-12-14 DIAGNOSIS — E1169 Type 2 diabetes mellitus with other specified complication: Secondary | ICD-10-CM | POA: Insufficient documentation

## 2021-12-14 DIAGNOSIS — B999 Unspecified infectious disease: Secondary | ICD-10-CM | POA: Insufficient documentation

## 2021-12-14 NOTE — Progress Notes (Addendum)
PHIL, MICHELS ([id number]) Visit Report for 12/14/2021 Arrival Information Details Patient Name: Jeremy [name], DIA Date of Service: 12/14/2021 9:30 AM Medical Record Number: [id number] Patient Account Number: [id number] Date of Birth/Sex: 08-28-1955 (65 y.o. M) Treating RN: Huel Coventry Primary Care Okley Magnussen: Barbette Reichmann Other Clinician: [name] Referring Jimesha Rising: Barbette Reichmann Treating Smiley Birr/Extender: Rowan Blase in Treatment: 14 Visit Information History Since Last Visit All ordered tests and consults were completed: No Patient Arrived: Ambulatory Added or deleted any medications: No Arrival Time: 09:36 Any new allergies or adverse reactions: No Transfer Assistance: None Had a fall or experienced change in No Patient Requires Transmission-Based No activities of daily living that may affect Precautions: risk of falls: Patient Has Alerts: Yes Hospitalized since last visit: No Patient Alerts: Patient on Blood Pain Present Now: No Thinner Eliquis Electronic Signature(s) Signed: 12/17/2021 4:13:59 PM By: [name] Entered By: [name] on 12/14/2021 09:37:23 Jeremy [name] ([id number]) -------------------------------------------------------------------------------- Clinic Level of Care Assessment Details Patient Name: Jeremy [name] Date of Service: 12/14/2021 9:30 AM Medical Record Number: [id number] Patient Account Number: [id number] Date of Birth/Sex: Jun 15, 1955 (65 y.o. M) Treating RN: Huel Coventry Primary Care Taiyana Kissler: Barbette Reichmann Other Clinician: [name] Referring Jayvien Rowlette: Barbette Reichmann Treating Kortney Potvin/Extender: Rowan Blase in Treatment: 14 Clinic Level of Care Assessment Items TOOL 4 Quantity Score []  - Use when only an EandM is performed on FOLLOW-UP visit 0 ASSESSMENTS - Nursing Assessment / Reassessment X - Reassessment of Co-morbidities (includes updates in patient status) 1 10 X- 1 5 Reassessment of  Adherence to Treatment Plan ASSESSMENTS - Wound and Skin Assessment / Reassessment X - Simple Wound Assessment / Reassessment - one wound 1 5 []  - 0 Complex Wound Assessment / Reassessment - multiple wounds []  - 0 Dermatologic / Skin Assessment (not related to wound area) ASSESSMENTS - Focused Assessment []  - Circumferential Edema Measurements - multi extremities 0 []  - 0 Nutritional Assessment / Counseling / Intervention []  - 0 Lower Extremity Assessment (monofilament, tuning fork, pulses) []  - 0 Peripheral Arterial Disease Assessment (using hand held doppler) ASSESSMENTS - Ostomy and/or Continence Assessment and Care []  - Incontinence Assessment and Management 0 []  - 0 Ostomy Care Assessment and Management (repouching, etc.) PROCESS - Coordination of Care X - Simple Patient / Family Education for ongoing care 1 15 []  - 0 Complex (extensive) Patient / Family Education for ongoing care X- 1 10 Staff obtains Consents, Records, Test Results / Process Orders []  - 0 Staff telephones HHA, Nursing Homes / Clarify orders / etc []  - 0 Routine Transfer to another Facility (non-emergent condition) []  - 0 Routine Hospital Admission (non-emergent condition) []  - 0 New Admissions / / Ordering NPWT, Apligraf, etc. []  - 0 Emergency Hospital Admission (emergent condition) X- 1 10 Simple Discharge Coordination []  - 0 Complex (extensive) Discharge Coordination PROCESS - Special Needs []  - Pediatric / Minor Patient Management 0 []  - 0 Isolation Patient Management []  - 0 Hearing / Language / Visual special needs []  - 0 Assessment of Community assistance (transportation, D/C planning, etc.) []  - 0 Additional assistance / Altered mentation []  - 0 Support Surface(s) Assessment (bed, cushion, seat, etc.) INTERVENTIONS - Wound Cleansing / Measurement Pickert, Deckard ( ) X- 1 5 Simple Wound Cleansing - one wound []  - 0 Complex Wound Cleansing - multiple  wounds X- 1 5 Wound Imaging (photographs - any number of wounds) []  - 0 Wound Tracing (instead of photographs) X- 1 5 Simple Wound Measurement - one wound []  - 0 Complex  Wound Measurement - multiple wounds INTERVENTIONS - Wound Dressings []  - Small Wound Dressing one or multiple wounds 0 X- 1 15 Medium Wound Dressing one or multiple wounds []  - 0 Large Wound Dressing one or multiple wounds []  - 0 Application of Medications - topical []  - 0 Application of Medications - injection INTERVENTIONS - Miscellaneous []  - External ear exam 0 X- 1 5 Specimen Collection (cultures, biopsies, blood, body fluids, etc.) X- 1 5 Specimen(s) / Culture(s) sent or taken to Lab for analysis []  - 0 Patient Transfer (multiple staff / / Similar devices) []  - 0 Simple Staple / Suture removal (25 or less) []  - 0 Complex Staple / Suture removal (26 or more) []  - 0 Hypo / Hyperglycemic Management (close monitor of Blood Glucose) []  - 0 Ankle / Brachial Index (ABI) - do not check if billed separately X- 1 5 Vital Signs Has the patient been seen at the hospital within the last three years: Yes Total Score: 100 Level Of Care: New/Established - Level 3 Electronic Signature(s) Signed: 12/17/2021 4:13:59 PM By: Entered By: on 12/14/2021 10:00:34 (Nurse, adult) -------------------------------------------------------------------------------- Encounter Discharge Information Details Patient Name: Date of Service: 12/14/2021 9:30 AM Medical Record Number: Patient Account Number: Date of Birth/Sex: 1955/11/04 (65 y.o. M) Treating RN: [name] Primary Care Rivers Hamrick: 02/14/2022 Other Clinician: Sarita [name] Referring Athen Riel: [phone] Treating Zamaria Brazzle/Extender: Jeremy [name] in Treatment: 14 Encounter Discharge Information Items Discharge Condition: Stable Ambulatory Status:  Ambulatory Discharge Destination: Home Transportation: Private Auto Accompanied By: self Schedule Follow-up Appointment: Yes Clinical Summary of Care: Electronic Signature(s) Signed: 12/17/2021 4:13:59 PM By: [id number] Entered By: [id number] on 12/14/2021 10:01:52 09-07-1990 (Huel Coventry) -------------------------------------------------------------------------------- Lower Extremity Assessment Details Patient Name: Barbette Reichmann Date of Service: 12/14/2021 9:30 AM Medical Record Number: Barbette Reichmann Patient Account Number: Rowan Blase Date of Birth/Sex: [birth year] (65 y.o. M) Treating RN: [name] Primary Care Steffany Schoenfelder: 02/14/2022 Other Clinician: Sarita [name] Referring Lucianne Smestad: [id number] Treating Wissam Resor/Extender: Jeremy [name] in Treatment: 14 Electronic Signature(s) Signed: 12/14/2021 2:38:00 PM By: [id number] BSN, RN, CWS, Kim RN, BSN Signed: 12/17/2021 4:13:59 PM By: 06/09/1956 Entered By: 09-07-1990 on 12/14/2021 09:46:22 Barbette Reichmann ([name]) -------------------------------------------------------------------------------- Multi Wound Chart Details Patient Name: Barbette Reichmann Date of Service: 12/14/2021 9:30 AM Medical Record Number: 02/14/2022 Patient Account Number: Elliot Gurney Date of Birth/Sex: 11-18-1955 (65 y.o. M) Treating RN: [name] Primary Care Brylie Sneath: 02/14/2022 Other Clinician: Sarita [name] Referring Mayari Matus: [id number] Treating Yusuf Yu/Extender: Jeremy [name] in Treatment: 14 Vital Signs Height(in): 71 Pulse(bpm): 89 Weight(lbs): 250 Blood Pressure(mmHg): 144/86 Body Mass Index(BMI): 34.9 Temperature(F): 97.5 Respiratory Rate(breaths/min): 18 Photos: [N/A:N/A] Wound Location: Right, Lateral Ankle N/A N/A Wounding Event: Gradually Appeared N/A N/A Primary Etiology: Refractory Osteomyelitis N/A N/A Secondary Etiology: Diabetic Wound/Ulcer of the Lower N/A N/A Extremity Comorbid  History: Arrhythmia, Congestive Heart N/A N/A Failure, Coronary Artery Disease, Hypertension, Type II Diabetes, Osteoarthritis, Osteomyelitis Date Acquired: 08/24/2021 N/A N/A Weeks of Treatment: 14 N/A N/A Wound Status: Open N/A N/A Wound Recurrence: No N/A N/A Pending Amputation on Yes N/A N/A Presentation: Measurements L x W x D (cm) 0.3x0.3x0.4 N/A N/A Area (cm) : 0.071 N/A N/A Volume (cm) : 0.028 N/A N/A % Reduction in Area: -129.00% N/A N/A % Reduction in Volume: -27.30% N/A N/A Classification: Full Thickness With Exposed N/A N/A Support Structures Exudate Amount: Medium N/A N/A Exudate Type: Serous N/A N/A Exudate Color: amber N/A N/A Granulation Amount:  Medium (34-66%) N/A N/A Granulation Quality: Pink N/A N/A Necrotic Amount: Small (1-33%) N/A N/A Exposed Structures: Fat Layer (Subcutaneous Tissue): N/A N/A Yes Bone: Yes Fascia: No Tendon: No Muscle: No Joint: No Epithelialization: Small (1-33%) N/A N/A Treatment Notes Electronic ANAY, RATHE ([id number]) Signed: 12/17/2021 4:13:59 PM By: [name] Entered By: [name] on 12/14/2021 09:46:38 Jeremy [name] ([id number]) -------------------------------------------------------------------------------- Multi-Disciplinary Care Plan Details Patient Name: Jeremy [name] Date of Service: 12/14/2021 9:30 AM Medical Record Number: [id number] Patient Account Number: [id number] Date of Birth/Sex: 1955/07/07 (65 y.o. M) Treating RN: Huel Coventry Primary Care Wilbur Labuda: Barbette Reichmann Other Clinician: [name] Referring Teniqua Marron: Barbette Reichmann Treating Dion Sibal/Extender: Rowan Blase in Treatment: 14 Active Inactive Osteomyelitis Nursing Diagnoses: Knowledge deficit related to disease process and management Potential for infection: osteomyelitis Goals: Patient/caregiver will verbalize understanding of disease process and disease management Date Initiated: 09/07/2021 Target  Resolution Date: 09/18/2021 Goal Status: Active Interventions: Assess for signs and symptoms of osteomyelitis resolution every visit Provide education on osteomyelitis Notes: Wound/Skin Impairment Nursing Diagnoses: Impaired tissue integrity Knowledge deficit related to smoking impact on wound healing Knowledge deficit related to ulceration/compromised skin integrity Goals: Patient will demonstrate a reduced rate of smoking or cessation of smoking Date Initiated: 09/07/2021 Target Resolution Date: 10/16/2021 Goal Status: Active Patient/caregiver will verbalize understanding of skin care regimen Date Initiated: 09/07/2021 Target Resolution Date: 09/18/2021 Goal Status: Active Ulcer/skin breakdown will have a volume reduction of 30% by week 4 Date Initiated: 09/07/2021 Target Resolution Date: 10/05/2021 Goal Status: Active Ulcer/skin breakdown will have a volume reduction of 50% by week 8 Date Initiated: 09/07/2021 Target Resolution Date: 11/02/2021 Goal Status: Active Ulcer/skin breakdown will have a volume reduction of 80% by week 12 Date Initiated: 09/07/2021 Target Resolution Date: 11/30/2021 Goal Status: Active Ulcer/skin breakdown will heal within 14 weeks Date Initiated: 09/07/2021 Target Resolution Date: 12/14/2021 Goal Status: Active Interventions: Assess patient/caregiver ability to obtain necessary supplies Assess patient/caregiver ability to perform ulcer/skin care regimen upon admission and as needed Assess ulceration(s) every visit Notes: Electronic Signature(s) MATTY, VANROEKEL ([id number]) Signed: 12/14/2021 2:38:00 PM By: Elliot Gurney, BSN, RN, CWS, Kim RN, BSN Signed: 12/17/2021 4:13:59 PM By: [name] Entered By: [name] on 12/14/2021 09:46:29 Jeremy [name] ([id number]) -------------------------------------------------------------------------------- Pain Assessment Details Patient Name: Jeremy [name] Date of Service: 12/14/2021 9:30 AM Medical Record Number:  [id number] Patient Account Number: [id number] Date of Birth/Sex: 10-26-1955 (65 y.o. M) Treating RN: Huel Coventry Primary Care Braidan Ricciardi: Barbette Reichmann Other Clinician: [name] Referring Jacklynn Dehaas: Barbette Reichmann Treating Ulas Zuercher/Extender: Rowan Blase in Treatment: 14 Active Problems Location of Pain Severity and Description of Pain Patient Has Paino No Site Locations Pain Management and Medication Current Pain Management: Electronic Signature(s) Signed: 12/14/2021 2:38:00 PM By: Elliot Gurney, BSN, RN, CWS, Kim RN, BSN Signed: 12/17/2021 4:13:59 PM By: [name] Entered By: [name] on 12/14/2021 09:39:52 Jeremy [name] ([id number]) -------------------------------------------------------------------------------- Patient/Caregiver Education Details Patient Name: Jeremy [name] Date of Service: 12/14/2021 9:30 AM Medical Record Number: [id number] Patient Account Number: [id number] Date of Birth/Gender: Dec 22, 1955 (65 y.o. M) Treating RN: Huel Coventry Primary Care Physician: Barbette Reichmann Other Clinician: [name] Referring Physician: Barbette Reichmann Treating Physician/Extender: Rowan Blase in Treatment: 14 Education Assessment Education Provided To: Patient Education Topics Provided Infection: Handouts: Infection Prevention and Management Methods: Explain/Verbal Responses: State content correctly Electronic Signature(s) Signed: 12/17/2021 4:13:59 PM By: [name] Entered By: [name] on 12/14/2021 10:01:08 Jeremy [name] ([id number]) -------------------------------------------------------------------------------- Wound Assessment Details Patient Name: Jeremy [name] Date of Service: 12/14/2021 9:30 AM Medical Record Number:  [id number] Patient Account Number: [id number] Date of Birth/Sex: [birth year] (65 y.o. M) Treating RN: Huel Coventry Primary Care Keena Heesch: Barbette Reichmann Other Clinician: [name] Referring Kashmere Staffa:  Barbette Reichmann Treating Roshan Salamon/Extender: Rowan Blase in Treatment: 14 Wound Status Wound Number: 3 Primary Refractory Osteomyelitis Etiology: Wound Location: Right, Lateral Ankle Secondary Diabetic Wound/Ulcer of the Lower Extremity Wounding Event: Gradually Appeared Etiology: Date Acquired: 08/24/2021 Wound Open Weeks Of Treatment: 14 Status: Clustered Wound: No Comorbid Arrhythmia, Congestive Heart Failure, Coronary Artery Pending Amputation On Presentation History: Disease, Hypertension, Type II Diabetes, Osteoarthritis, Osteomyelitis Photos Wound Measurements Length: (cm) 0.3 Width: (cm) 0.3 Depth: (cm) 0.4 Area: (cm) 0.071 Volume: (cm) 0.028 % Reduction in Area: -129% % Reduction in Volume: -27.3% Epithelialization: Small (1-33%) Wound Description Classification: Full Thickness With Exposed Support Structures Exudate Amount: Medium Exudate Type: Serous Exudate Color: amber Foul Odor After Cleansing: No Slough/Fibrino Yes Wound Bed Granulation Amount: Medium (34-66%) Exposed Structure Granulation Quality: Pink Fascia Exposed: No Necrotic Amount: Small (1-33%) Fat Layer (Subcutaneous Tissue) Exposed: Yes Necrotic Quality: Adherent Slough Tendon Exposed: No Muscle Exposed: No Joint Exposed: No Bone Exposed: Yes Treatment Notes Wound #3 (Ankle) Wound Laterality: Right, Lateral Cleanser Normal Saline Discharge Instruction: Wash your hands with soap and water. Remove old dressing, discard into plastic bag and place into trash. Cleanse the wound with Normal Saline prior to applying a clean dressing using gauze sponges, not tissues or cotton balls. Do not Hoelzer, Kauan ([id number]) scrub or use excessive force. Pat dry using gauze sponges, not tissue or cotton balls. Peri-Wound Care Topical Primary Dressing Hydrofera Blue Ready Transfer Foam, 2.5x2.5 (in/in) Discharge Instruction: Apply Hydrofera Blue Ready to wound bed as directed Secondary  Dressing Secured With Compression Wrap Compression Stockings Add-Ons Electronic Signature(s) Signed: 12/14/2021 2:38:00 PM By: Elliot Gurney, BSN, RN, CWS, Kim RN, BSN Signed: 12/17/2021 4:13:59 PM By: [name] Entered By: [name] on 12/14/2021 09:45:56 Jeremy [name] ([id number]) -------------------------------------------------------------------------------- Vitals Details Patient Name: Jeremy [name] Date of Service: 12/14/2021 9:30 AM Medical Record Number: [id number] Patient Account Number: [id number] Date of Birth/Sex: 30-Jul-1955 (65 y.o. M) Treating RN: Huel Coventry Primary Care Alondria Mousseau: Barbette Reichmann Other Clinician: [name] Referring Izabell Schalk: Barbette Reichmann Treating Deyana Wnuk/Extender: Rowan Blase in Treatment: 14 Vital Signs Time Taken: 09:37 Temperature (F): 97.5 Height (in): 71 Pulse (bpm): 89 Weight (lbs): 250 Respiratory Rate (breaths/min): 18 Body Mass Index (BMI): 34.9 Blood Pressure (mmHg): 144/86 Reference Range: 80 - 120 mg / dl Electronic Signature(s) Signed: 12/17/2021 4:13:59 PM By: [name] Entered By: [name] on 12/14/2021 09:39:31

## 2021-12-14 NOTE — Progress Notes (Addendum)
GINA, COSTILLA ([id number]) Visit Report for 12/14/2021 Chief Complaint Document Details Patient Name: Jeremy [name], Jeremy [name] Date of Service: 12/14/2021 9:30 AM Medical Record Number: [id number] Patient Account Number: [id number] Date of Birth/Sex: 03/06/1956 (65 y.o. M) Treating RN: Huel Coventry Primary Care Provider: Barbette Reichmann Other Clinician: [name] Referring Provider: Barbette Reichmann Treating Provider/Extender: Rowan Blase in Treatment: 14 Information Obtained from: Patient Chief Complaint Right ankle ulcer Electronic Signature(s) Signed: 12/14/2021 9:05:49 AM By: Lenda Kelp PA-C Entered By: Lenda Kelp on 12/14/2021 09:05:48 Jeremy [name] ([id number]) -------------------------------------------------------------------------------- HPI Details Patient Name: Jeremy [name] Date of Service: 12/14/2021 9:30 AM Medical Record Number: [id number] Patient Account Number: [id number] Date of Birth/Sex: [birth year] (65 y.o. M) Treating RN: Huel Coventry Primary Care Provider: Barbette Reichmann Other Clinician: [name] Referring Provider: Barbette Reichmann Treating Provider/Extender: Rowan Blase in Treatment: 14 History of Present Illness HPI Description: 08/18/2020 upon evaluation today patient presents for initial inspection here in our clinic concerning issues has been having with his right lateral ankle and this has been present for at least a year he tells me. He is a patient of Dr. Orland Jarred. Subsequently Dr. Orland Jarred has since retired hence the patient look this up and is coming here for wound care to see if we can help him out. He did have a screw pushed out of this location and since that time tells me that the screw was removed but nonetheless the hole has remained. Fortunately there does not appear to be any signs of active infection systemically at this point though it does raise the question as to whether or not this might be a  bone/hardware infection being that this has been open for so long. The patient does have a history of diabetes mellitus type 2, coronary artery disease, hypertension, and unfortunately does not seem to be doing as great as I would like to see him regard to his left ankle. 08/25/2020 upon evaluation today patient appears to be doing well with regard to his ankle ulcer compared to last week this is definitely smaller. With that being said it still does probe down to bone/hardware. I still think this can be appropriate for him to see the orthopedic specialist. He has not heard from them yet he tells me. 09/05/2020 upon evaluation today patient appears to be doing extremely well in regard to his wound all things considered. I do not see that this is gotten any worse. Unfortunately also do not think he is gotten any better. We did make a referral to orthopedics to Dr. Victorino Dike specifically but the patient tells me that he has not heard from them. That is unusual as they normally get in touch with patients fairly quickly. I will ask him if he possibly missed a phone call he tells me he will check when he gets home. Nonetheless he does not want to go to [facility] anyway which he did not tell me previously therefore we will get a see about making a referral to Dr. Logan Bores to see what he potentially could recommend for the patient as well I think Dr. Logan Bores is excellent and a good option here. Readmission: 10/28/2020 upon evaluation today patient appears to be doing about the same as when I last saw him. He did not come back for follow-up last time I saw him was 09/05/2020. Basically he tells me that he has been attempting to manage this on his own using the Hydrofera Blue rope. He did get a call from [facility] but they  did not actually get him scheduled due to the fact that the patient told me that he told them he did not wanted to see them he just wanted to come back and see me because he was happy with my  care. Nonetheless as I explained to the patient he has a fractured screw at the site of the wound he also has another screw where there appears to be some loosening of the screw and potential for hardware/bone infection here. Subsequently I think he really does need to see a specialist to see what they can do to help him out I do not believe him to be able to get this healed without taking care of the hardware issues. 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 He has been advised previous of a need for an above-knee amputation due to some of the aggressive breakdown in the ankle region. Nonetheless he has had the removal of the screw performed in office with Dr. Luana Shu and this was on 08/28/2021. Subsequently the patient is a poor historian he also gets upset very easily. Of note I do believe that he is going to require some dressings to be packed into the area due to the fact that to be honest this is still tracking all the way straight down to bone where there was pus and purulence coming out once I did get down to that area. Subsequently he tells me that he is having pain although it does seem to be doing better since the screw was removed. 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 Therefore his best options probably do entail the continue following up by Dr. Ola Spurr for infectious disease coupled with wound care as best we can and try to see if we keep this clean as much as possible and keep it from becoming more infected. At some point there may be a time where this could continually get worse and may end up not doing nearly as well but right now he does not seem to be doing too poorly and I think this is the best way to support him. I am not overly  confident that this is ever getting heal however. 09/15/21 upon evaluation today patient appears to be doing well with regard to his wound. He has been tolerating the dressing changes without complication. Fortunately I do not see any signs of active infection at this time. Overall I think that he is doing quite well. 09-22-2021 upon evaluation today patient appears to be doing about the same in regard to his ankle. 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 He again has been told that there is probably not a likely scenario in which this is able to heal. Nonetheless he is not a HBO candidate due to low ejection fraction. He does see Dr. Ola Spurr he still taking antibiotics at this point we will try to keep the area open so does not develop an abscess. 09-29-2021 upon evaluation today patient appears to be doing well with regard to his wound all things considered. He continues to use the [employer] rope. This is still a fairly deep wound that I think the rope is doing well to help  keep the drainage from collecting in the base of the wound there does appear to be less drainage definitely not as purulent as what we have noticed in the past. There is no increased pain and no signs of worsening in general. 10-06-2021 upon evaluation today patient's wound is continuing to show signs of issues here at this point. Fortunately I do not see any evidence of active infection locally or systemically which is great news but at the same time he is definitely having some ongoing problems here currently. 5/8; 2-week follow-up. Right medial ankle. Small wound purulent drainage. He is on doxycycline chronically for underlying osteomyelitis. He has hardware in this area. He is using Hydrofera Blue packing strips 11-02-2021 upon evaluation today patient appears to be doing much better in regard to his wound I am very pleased to see this.  I do not see any evidence of active infection locally nor systemically which is great news. No fevers, chills, nausea, vomiting, or diarrhea. Jeremy [name], Jeremy [name] ([id number]) 11-16-2021 upon evaluation today patient appears to be doing well with regard to his ankle ulcer this is a very tiny area remaining at this point. Fortunately I do not see any evidence of infection locally or systemically at this time. Overall I think we are on the right track. 11-30-2021 Upon inspection patient's wound bed actually showed signs of having just a very small opening that still between 1 to 2 mm max. The back end of the sterile Q-tip stick I was able to get down in here and it does still probe down to bone. With that being said he still has drainage as well. Nonetheless he has not wanted any further surgical intervention and so mainly is just more of something were doing to try to manage this as best we can do only thing really were doing a split Hydrofera Blue externally has a lot of new skin covering over where previously had a lot of open area but at the same time I am still concerned about the fact this probes all the way down to bone. 12-14-2021 upon evaluation today patient appears to be doing about the same in regard to his wound. This still actually probes down to bone. He has not seen ID anytime recently. Has been off of the doxycycline for quite some time. For that reason I really feel like we may need to see about getting something done a little bit more specific for him here and when to try to obtain a culture today so that we can see what that shows and maybe get him on antibiotics that could help this to heal faster. He is in agreement with that plan. Electronic Signature(s) Signed: 12/14/2021 9:56:46 AM By: Lenda Kelp PA-C Entered By: Lenda Kelp on 12/14/2021 09:56:45 Jeremy [name], Jeremy [name] ([id number]) -------------------------------------------------------------------------------- Physical Exam  Details Patient Name: Jeremy [name] Date of Service: 12/14/2021 9:30 AM Medical Record Number: [id number] Patient Account Number: [id number] Date of Birth/Sex: [birth year] (65 y.o. M) Treating RN: Huel Coventry Primary Care Provider: Barbette Reichmann Other Clinician: [name] Referring Provider: Barbette Reichmann Treating Provider/Extender: Rowan Blase in Treatment: 14 Constitutional Well-nourished and well-hydrated in no acute distress. Respiratory normal breathing without difficulty. Psychiatric this patient is able to make decisions and demonstrates good insight into disease process. Alert and Oriented x 3. pleasant and cooperative. Notes Upon inspection patient's wound bed actually showed signs of good granulation and epithelization for the most part although he still has a small area that is probing down to bone  this still does seem to be a sinus tract in my opinion. Again amputations been recommended I discussed hyperbarics with him before and he was not interested in either. With that being said we mainly just been trying to manage this as best we can from wound care perspective. Electronic Signature(s) Signed: 12/14/2021 9:57:10 AM By: Lenda Kelp PA-C Entered By: Lenda Kelp on 12/14/2021 09:57:10 Jeremy [name] ([id number]) -------------------------------------------------------------------------------- Physician Orders Details Patient Name: Jeremy [name] Date of Service: 12/14/2021 9:30 AM Medical Record Number: [id number] Patient Account Number: [id number] Date of Birth/Sex: Jun 21, 1955 (65 y.o. 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 Bathing/ Shower/ Hygiene o May shower; gently cleanse wound with antibacterial soap, rinse and pat dry prior to dressing wounds o No tub bath. Edema Control - Lymphedema / Segmental Compressive Device / Other o Elevate leg(s) parallel to the floor when sitting. o DO YOUR BEST to sleep in the bed at night. DO NOT sleep in your recliner. Long hours of sitting in a recliner leads to swelling of the legs and/or potential wounds on your backside. Additional Orders / Instructions o Follow Nutritious Diet and Increase Protein Intake - monitor blood glucose to maintain normal level Culture obtained today Wound Treatment Wound #3 - Ankle Wound Laterality: Right, Lateral Cleanser: Normal Saline 1 x Per Day/30 Days Discharge Instructions: Wash your hands with soap and water. Remove old dressing, discard into plastic bag and place into trash. Cleanse the wound with Normal Saline prior to applying a clean dressing using gauze sponges, not tissues or cotton balls. Do not scrub or use excessive force. Pat dry using gauze sponges, not tissue or cotton balls. Primary Dressing: Hydrofera Blue Ready Transfer Foam, 2.5x2.5 (in/in) 1 x Per Day/30 Days Discharge Instructions: Apply Hydrofera Blue Ready to wound bed as directed Electronic Signature(s) Unsigned Entered By: [name] on 12/14/2021 09:58:18 Signature(s): Date(s): Jeremy [name] ([id number]) -------------------------------------------------------------------------------- Problem List Details Patient Name: Jeremy [name], Jeremy [name] Date of Service: 12/14/2021 9:30 AM Medical Record Number: [id number] Patient  Account Number: [id number] Date of Birth/Sex: Apr 23, 1956 (65 y.o. 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 without 09/07/2021 No Yes evidence of necrosis E11.622 Type 2 diabetes mellitus with other skin ulcer 09/07/2021 No Yes I10 Essential (primary) hypertension 09/07/2021 No Yes I25.10 Atherosclerotic heart disease of native coronary artery without angina 09/07/2021 No Yes pectoris Inactive Problems Resolved Problems Electronic Signature(s) Signed: 12/14/2021 9:05:45 AM By: Lenda Kelp PA-C Entered By: Lenda Kelp on 12/14/2021 09:05:44 Jeremy [name] ([id number]) -------------------------------------------------------------------------------- Progress Note Details Patient Name: Jeremy [name] Date of Service: 12/14/2021 9:30 AM Medical Record Number: [id number] Patient Account Number: [id number] Date of Birth/Sex: 20-Apr-1956 (65 y.o. M) Treating RN: Huel Coventry Primary Care Provider:  Barbette Reichmann Other Clinician: [name] Referring Provider: Barbette Reichmann Treating Provider/Extender: Rowan Blase in Treatment: 14 Subjective Chief Complaint Information obtained from Patient Right ankle ulcer History of Present Illness (HPI) 08/18/2020 upon evaluation today patient presents for initial inspection here in our clinic concerning issues has been having with his right lateral ankle and this has been present for at least a year he tells me. He is a patient of Dr. Orland Jarred. Subsequently Dr. Orland Jarred has since retired hence the patient look this up and is coming here for  wound care to see if we can help him out. He did have a screw pushed out of this location and since that time tells me that the screw was removed but nonetheless the hole has remained. Fortunately there does not appear to be any signs of active infection systemically at this point though it does raise the question as to whether or not this might be a bone/hardware infection being that this has been open for so long. The patient does have a history of diabetes mellitus type 2, coronary artery disease, hypertension, and unfortunately does not seem to be doing as great as I would like to see him regard to his left ankle. 08/25/2020 upon evaluation today patient appears to be doing well with regard to his ankle ulcer compared to last week this is definitely smaller. With that being said it still does probe down to bone/hardware. I still think this can be appropriate for him to see the orthopedic specialist. He has not heard from them yet he tells me. 09/05/2020 upon evaluation today patient appears to be doing extremely well in regard to his wound all things considered. I do not see that this is gotten any worse. Unfortunately also do not think he is gotten any better. We did make a referral to orthopedics to Dr. Victorino Dike specifically but the patient tells me that he has not heard from them. That is unusual as they normally get in touch with patients fairly quickly. I will ask him if he possibly missed a phone call he tells me he will check when he gets home. Nonetheless he does not want to go to [facility] anyway which he did not tell me previously therefore we will get a see about making a referral to Dr. Logan Bores to see what he potentially could recommend for the patient as well I think Dr. Logan Bores is excellent and a good option here. Readmission: 10/28/2020 upon evaluation today patient appears to be doing about the same as when I last saw him. He did not come back for follow-up last time I saw him was  09/05/2020. Basically he tells me that he has been attempting to manage this on his own using the Hydrofera Blue rope. He did get a call from [facility] but they did not actually get him scheduled due to the fact that the patient told me that he told them he did not wanted to see them he just wanted to come back and see me because he was happy with my care. Nonetheless as I explained to the patient he has a fractured screw at the site of the wound he also has another screw where there appears to be some loosening of the screw and potential for hardware/bone infection here. Subsequently I think he really does need to see a specialist to see what they can do to help him out I do not believe him to be able to get this healed without taking care  of the hardware issues. 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 He has been advised previous of a need for an above-knee amputation due to some of the aggressive breakdown in the ankle region. Nonetheless he has had the removal of the screw performed in office with Dr. Excell Seltzer and this was on 08/28/2021. Subsequently the patient is a poor historian he also gets upset very easily. Of note I do believe that he is going to require some dressings to be packed into the area due to the fact that to be honest this is still tracking all the way straight down to bone where there was pus and purulence coming out once I did get down to that area. Subsequently he tells me that he is having pain although it does seem to be doing better since the screw was removed. 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 Therefore his best options probably do entail the continue following up by Dr. Sampson Goon for infectious disease coupled with  wound care as best we can and try to see if we keep this clean as much as possible and keep it from becoming more infected. At some point there may be a time where this could continually get worse and may end up not doing nearly as well but right now he does not seem to be doing too poorly and I think this is the best way to support him. I am not overly confident that this is ever getting heal however. 09/15/21 upon evaluation today patient appears to be doing well with regard to his wound. He has been tolerating the dressing changes without complication. Fortunately I do not see any signs of active infection at this time. Overall I think that he is doing quite well. 09-22-2021 upon evaluation today patient appears to be doing about the same in regard to his ankle. 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 He again has been told that there is probably not a likely scenario in which this is able to heal. Nonetheless he is not a HBO candidate due to low ejection fraction. He does see Dr. Sampson Goon he still taking antibiotics at this point we will try to keep the area open so does not develop an abscess. 09-29-2021 upon evaluation today patient appears to be doing well with regard to his wound all things considered. He continues to use the [employer] rope. This is still a fairly deep wound that I think the rope is doing well to help keep the drainage from collecting in the base of the wound there does appear to be less drainage definitely not as purulent as what we have noticed in the past. There is no increased pain and no signs of worsening in general. 10-06-2021 upon evaluation today patient's wound is continuing to show signs of issues here at this point. Fortunately I do not see any evidence of active infection locally or systemically which is great news but at the same time he is definitely having some ongoing  problems here currently. 5/8; 2-week follow-up. Right medial ankle. Small wound purulent drainage. He is on doxycycline chronically for underlying osteomyelitis. He has Clementson, Thien ([id number]) hardware in this area.  He is using Hydrofera Blue packing strips 11-02-2021 upon evaluation today patient appears to be doing much better in regard to his wound I am very pleased to see this. I do not see any evidence of active infection locally nor systemically which is great news. No fevers, chills, nausea, vomiting, or diarrhea. 11-16-2021 upon evaluation today patient appears to be doing well with regard to his ankle ulcer this is a very tiny area remaining at this point. Fortunately I do not see any evidence of infection locally or systemically at this time. Overall I think we are on the right track. 11-30-2021 Upon inspection patient's wound bed actually showed signs of having just a very small opening that still between 1 to 2 mm max. The back end of the sterile Q-tip stick I was able to get down in here and it does still probe down to bone. With that being said he still has drainage as well. Nonetheless he has not wanted any further surgical intervention and so mainly is just more of something were doing to try to manage this as best we can do only thing really were doing a split Hydrofera Blue externally has a lot of new skin covering over where previously had a lot of open area but at the same time I am still concerned about the fact this probes all the way down to bone. 12-14-2021 upon evaluation today patient appears to be doing about the same in regard to his wound. This still actually probes down to bone. He has not seen ID anytime recently. Has been off of the doxycycline for quite some time. For that reason I really feel like we may need to see about getting something done a little bit more specific for him here and when to try to obtain a culture today so that we can see what that shows  and maybe get him on antibiotics that could help this to heal faster. He is in agreement with that plan. Objective Constitutional Well-nourished and well-hydrated in no acute distress. Vitals Time Taken: 9:37 AM, Height: 71 in, Weight: 250 lbs, BMI: 34.9, Temperature: 97.5 F, Pulse: 89 bpm, Respiratory Rate: 18 breaths/min, Blood Pressure: 144/86 mmHg. Respiratory normal breathing without difficulty. Psychiatric this patient is able to make decisions and demonstrates good insight into disease process. Alert and Oriented x 3. pleasant and cooperative. General Notes: Upon inspection patient's wound bed actually showed signs of good granulation and epithelization for the most part although he still has a small area that is probing down to bone this still does seem to be a sinus tract in my opinion. Again amputations been recommended I discussed hyperbarics with him before and he was not interested in either. With that being said we mainly just been trying to manage this as best we can from wound care perspective. Integumentary (Hair, Skin) Wound #3 status is Open. Original cause of wound was Gradually Appeared. The date acquired was: 08/24/2021. The wound has been in treatment 14 weeks. The wound is located on the Right,Lateral Ankle. The wound measures 0.3cm length x 0.3cm width x 0.4cm depth; 0.071cm^2 area and 0.028cm^3 volume. There is bone and Fat Layer (Subcutaneous Tissue) exposed. There is a medium amount of serous drainage noted. There is medium (34-66%) pink granulation within the wound bed. There is a small (1-33%) amount of necrotic tissue within the wound bed including Adherent Slough. 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 Would recommend currently that we going continue with the wound care measures as before and the patient is in agreement with plan. We are using the [facility] which seems to be doing decently well. 2. I am also can recommend that we have the patient continue to monitor for any signs of worsening or infection. Office if anything changes he should contact the office and let me know. We will see patient back for reevaluation in 1 week here in the clinic. If anything worsens or changes patient will contact our office for additional recommendations. Electronic Signature(s) Signed: 12/14/2021 9:57:36 AM By: Lenda Kelp PA-C Entered By: Lenda Kelp on 12/14/2021 09:57:36 Jeremy [name] ([id number]) -------------------------------------------------------------------------------- SuperBill Details Patient Name: Jeremy [name] Date of Service: 12/14/2021 Medical Record Number: [id number] Patient Account Number: [id number] Date of Birth/Sex: 12/15/1955 (65 y.o. 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 evidence of necrosis E11.622 Type 2 diabetes mellitus with other skin ulcer I10 Essential (primary) hypertension I25.10 Atherosclerotic heart disease of native coronary artery without angina pectoris Facility Procedures CPT4 Code: 24401027 Description: 99213 - WOUND CARE VISIT-LEV 3 EST PT Modifier: Quantity: 1 Physician Procedures CPT4 Code Description: [id number] 99214 - WC PHYS LEVEL 4 - EST PT Modifier: Quantity: 1 CPT4 Code  Description: ICD-10 Diagnosis Description M86.471 Chronic osteomyelitis with draining sinus, right ankle and foot L97.316 Non-pressure chronic ulcer of right ankle with bone involvement without E11.622 Type 2 diabetes mellitus with other skin  ulcer I10 Essential (primary) hypertension Modifier: evidence of necrosi Quantity: s Electronic Signature(s) Unsigned Previous Signature: 12/14/2021 9:57:53 AM Version By: Lenda Kelp PA-C Entered By: [name] on 12/14/2021 10:00:50 Signature(s): Date(s):

## 2021-12-16 LAB — AEROBIC CULTURE W GRAM STAIN (SUPERFICIAL SPECIMEN)

## 2021-12-28 ENCOUNTER — Encounter: Payer: Medicaid Other | Admitting: Physician Assistant

## 2021-12-28 DIAGNOSIS — E11622 Type 2 diabetes mellitus with other skin ulcer: Secondary | ICD-10-CM | POA: Diagnosis not present

## 2021-12-28 NOTE — Progress Notes (Signed)
DELORIS, MITTAG ([id number]) Visit Report for 12/28/2021 Chief Complaint Document Details Patient Name: Jeremy [name], Jeremy [name] Date of Service: 12/28/2021 9:45 AM Medical Record Number: [id number] Patient Account Number: [id number] Date of Birth/Sex: February 24, 1956 (65 y.o. M) Treating RN: Angelina Pih Primary Care Provider: Barbette Reichmann Other Clinician: [name] Referring Provider: Barbette Reichmann Treating Provider/Extender: Rowan Blase in Treatment: 16 Information Obtained from: Patient Chief Complaint Right ankle ulcer Electronic Signature(s) Signed: 12/28/2021 9:22:43 AM By: Lenda Kelp PA-C Entered By: Lenda Kelp on 12/28/2021 09:22:43 [name] ([id number]) -------------------------------------------------------------------------------- Problem List Details Patient Name: [name] Date of Service: 12/28/2021 9:45 AM Medical Record Number: [id number] Patient Account Number: [id number] Date of Birth/Sex: January 12, 1956 (65 y.o. M) Treating RN: Angelina Pih Primary Care Provider: Barbette Reichmann Other Clinician: [name] Referring Provider: Barbette Reichmann Treating Provider/Extender: Rowan Blase in Treatment: 16 Active Problems ICD-10 Encounter Code Description Active Date MDM Diagnosis M86.471 Chronic osteomyelitis with draining sinus, right ankle and foot 09/07/2021 No Yes L97.316 Non-pressure chronic ulcer of right ankle with bone involvement without 09/07/2021 No Yes evidence of necrosis E11.622 Type 2 diabetes mellitus with other skin ulcer 09/07/2021 No Yes I10 Essential (primary) hypertension 09/07/2021 No Yes I25.10 Atherosclerotic heart disease of native coronary artery without angina 09/07/2021 No Yes pectoris Inactive Problems Resolved Problems Electronic Signature(s) Signed: 12/28/2021 9:22:39 AM By: Lenda Kelp PA-C Entered By: Lenda Kelp on 12/28/2021 09:22:39

## 2021-12-28 NOTE — Progress Notes (Addendum)
TAVIS, KRING ([id number]) Visit Report for 12/28/2021 Arrival Information Details Patient Name: Jeremy [name], Jeremy [name] Date of Service: 12/28/2021 9:45 AM Medical Record Number: [id number] Patient Account Number: [id number] Date of Birth/Sex: 1955-11-14 (65 y.o. M) Treating RN: Angelina Pih Primary Care Koda Routon: Barbette Reichmann Other Clinician: [name] Referring Xxavier Noon: Barbette Reichmann Treating Saleemah Mollenhauer/Extender: Rowan Blase in Treatment: 16 Visit Information History Since Last Visit All ordered tests and consults were completed: No Patient Arrived: Ambulatory Added or deleted any medications: No Arrival Time: 09:46 Any new allergies or adverse reactions: No Transfer Assistance: None Had a fall or experienced change in No Patient Requires Transmission-Based No activities of daily living that may affect Precautions: risk of falls: Patient Has Alerts: Yes Hospitalized since last visit: No Patient Alerts: Patient on Blood Pain Present Now: No Thinner Eliquis Electronic Signature(s) Signed: 12/30/2021 8:34:35 AM By: [name] Entered By: [name] on 12/28/2021 09:53:26 Jeremy [name] ([id number]) -------------------------------------------------------------------------------- Clinic Level of Care Assessment Details Patient Name: Jeremy [name] Date of Service: 12/28/2021 9:45 AM Medical Record Number: [id number] Patient Account Number: [id number] Date of Birth/Sex: [birth year] (65 y.o. M) Treating RN: Angelina Pih Primary Care Rexton Greulich: Barbette Reichmann Other Clinician: [name] Referring Jawuan Robb: Barbette Reichmann Treating Kattie Santoyo/Extender: Rowan Blase in Treatment: 16 Clinic Level of Care Assessment Items TOOL 1 Quantity Score []  - Use when EandM and Procedure is performed on INITIAL visit 0 ASSESSMENTS - Nursing Assessment / Reassessment []  - General Physical Exam (combine w/ comprehensive assessment (listed just below)  when performed on new 0 pt. evals) []  - 0 Comprehensive Assessment (HX, ROS, Risk Assessments, Wounds Hx, etc.) ASSESSMENTS - Wound and Skin Assessment / Reassessment []  - Dermatologic / Skin Assessment (not related to wound area) 0 ASSESSMENTS - Ostomy and/or Continence Assessment and Care []  - Incontinence Assessment and Management 0 []  - 0 Ostomy Care Assessment and Management (repouching, etc.) PROCESS - Coordination of Care []  - Simple Patient / Family Education for ongoing care 0 []  - 0 Complex (extensive) Patient / Family Education for ongoing care []  - 0 Staff obtains , Records, Test Results / Process Orders []  - 0 Staff telephones HHA, Nursing Homes / Clarify orders / etc []  - 0 Routine Transfer to another Facility (non-emergent condition) []  - 0 Routine Hospital Admission (non-emergent condition) []  - 0 New Admissions / / Ordering NPWT, Apligraf, etc. []  - 0 Emergency Hospital Admission (emergent condition) PROCESS - Special Needs []  - Pediatric / Minor Patient Management 0 []  - 0 Isolation Patient Management []  - 0 Hearing / Language / Visual special needs []  - 0 Assessment of Community assistance (transportation, D/C planning, etc.) []  - 0 Additional assistance / Altered mentation []  - 0 Support Surface(s) Assessment (bed, cushion, seat, etc.) INTERVENTIONS - Miscellaneous []  - External ear exam 0 []  - 0 Patient Transfer (multiple staff / / Similar devices) []  - 0 Simple Staple / Suture removal (25 or less) []  - 0 Complex Staple / Suture removal (26 or more) []  - 0 Hypo/Hyperglycemic Management (do not check if billed separately) []  - 0 Ankle / Brachial Index (ABI) - do not check if billed separately Has the patient been seen at the hospital within the last three years: Yes Total Score: 0 Level Of Care: ____ ( ) Electronic Signature(s) Signed: 12/30/2021 8:34:35 AM By: Entered By: on 12/28/2021 10:30:26 Chiropractor ( ) -------------------------------------------------------------------------------- Encounter Discharge Information Details Patient Name: Date of Service: 12/28/2021 9:45 AM Medical  Record Number: [id number] Patient Account Number: [id number] Date of Birth/Sex: 09/08/1955 (65 y.o. M) Treating RN: Angelina Pih Primary Care Akasia Ahmad: Barbette Reichmann Other Clinician: [name] Referring Lylee Corrow: Barbette Reichmann Treating Mikella Linsley/Extender: Rowan Blase in Treatment: 16 Encounter Discharge Information Items Post Procedure Vitals Discharge Condition: Stable Temperature (F): 97.9 Ambulatory Status: Ambulatory Pulse (bpm): 118 Discharge Destination: Home Respiratory Rate (breaths/min): 16 Transportation: Private Auto Blood Pressure (mmHg): 152/73 Accompanied By: self Schedule Follow-up Appointment: No Clinical Summary of Care: Electronic Signature(s) Signed: 12/30/2021 8:34:35 AM By: [name] Entered By: [name] on 12/28/2021 10:32:25 Jeremy [name] ([id number]) -------------------------------------------------------------------------------- Lower Extremity Assessment Details Patient Name: Jeremy [name] Date of Service: 12/28/2021 9:45 AM Medical Record Number: [id number] Patient Account Number: [id number] Date of Birth/Sex: [birth year] (65 y.o. M) Treating RN: Angelina Pih Primary Care Denis Koppel: Barbette Reichmann Other Clinician: [name] Referring Jeniah Kishi: Barbette Reichmann Treating Jahmier Willadsen/Extender: Rowan Blase in Treatment: 16 Electronic Signature(s) Signed: 12/28/2021 4:46:59 PM By: Angelina Pih Signed: 12/30/2021 8:34:35 AM By: [name] Entered By: [name] on 12/28/2021 10:07:09 Jeremy [name] ([id number]) -------------------------------------------------------------------------------- Multi Wound Chart  Details Patient Name: Jeremy [name] Date of Service: 12/28/2021 9:45 AM Medical Record Number: [id number] Patient Account Number: [id number] Date of Birth/Sex: 11/07/1955 (65 y.o. M) Treating RN: Angelina Pih Primary Care Edel Rivero: Barbette Reichmann Other Clinician: [name] Referring Haset O[place]: Barbette Reichmann Treating Aldyn Toon/Extender: Rowan Blase in Treatment: 16 Vital Signs Height(in): 71 Pulse(bpm): 118 Weight(lbs): 250 Blood Pressure(mmHg): 152/73 Body Mass Index(BMI): 34.9 Temperature(F): 97.9 Respiratory Rate(breaths/min): 16 Photos: [N/A:N/A] Wound Location: Right, Lateral Ankle N/A N/A Wounding Event: Gradually Appeared N/A N/A Primary Etiology: Refractory Osteomyelitis N/A N/A Secondary Etiology: Diabetic Wound/Ulcer of the Lower N/A N/A Extremity Comorbid History: Arrhythmia, Congestive Heart N/A N/A Failure, Coronary Artery Disease, Hypertension, Type II Diabetes, Osteoarthritis, Osteomyelitis Date Acquired: 08/24/2021 N/A N/A Weeks of Treatment: 16 N/A N/A Wound Status: Open N/A N/A Wound Recurrence: No N/A N/A Pending Amputation on Yes N/A N/A Presentation: Measurements L x W x D (cm) 1.3x0.9x1.5 N/A N/A Area (cm) : 0.919 N/A N/A Volume (cm) : 1.378 N/A N/A % Reduction in Area: -2864.50% N/A N/A % Reduction in Volume: -6163.60% N/A N/A Starting Position 1 (o'clock): 12 Ending Position 1 (o'clock): 12 Maximum Distance 1 (cm): 1.5 Undermining: Yes N/A N/A Classification: Full Thickness With Exposed N/A N/A Support Structures Exudate Amount: Medium N/A N/A Exudate Type: Purulent N/A N/A Exudate Color: yellow, brown, green N/A N/A Granulation Amount: Medium (34-66%) N/A N/A Granulation Quality: Pink N/A N/A Necrotic Amount: Small (1-33%) N/A N/A Exposed Structures: Fat Layer (Subcutaneous Tissue): N/A N/A Yes Bone: Yes Fascia: No Tendon: No Muscle: No Joint: No Epithelialization: Small (1-33%) N/A N/A Ludie, [place]  ([id number]) Treatment Notes Electronic Signature(s) Signed: 12/30/2021 8:34:35 AM By: [name] Entered By: [name] on 12/28/2021 10:07:41 Jeremy [name] ([id number]) -------------------------------------------------------------------------------- Multi-Disciplinary Care Plan Details Patient Name: Jeremy [name] Date of Service: 12/28/2021 9:45 AM Medical Record Number: [id number] Patient Account Number: [id number] Date of Birth/Sex: [birth year] (65 y.o. M) Treating RN: Angelina Pih Primary Care Ollen Rao: Barbette Reichmann Other Clinician: [name] Referring Mailen Newborn: Barbette Reichmann Treating Lekia Nier/Extender: Rowan Blase in Treatment: 16 Active Inactive Osteomyelitis Nursing Diagnoses: Knowledge deficit related to disease process and management Potential for infection: osteomyelitis Goals: Patient/caregiver will verbalize understanding of disease process and disease management Date Initiated: 09/07/2021 Target Resolution Date: 09/18/2021 Goal Status: Active Interventions: Assess for signs and symptoms of osteomyelitis resolution every visit Provide education on osteomyelitis Notes: Wound/Skin Impairment Nursing Diagnoses: Impaired tissue integrity Knowledge deficit  related to smoking impact on wound healing Knowledge deficit related to ulceration/compromised skin integrity Goals: Patient will demonstrate a reduced rate of smoking or cessation of smoking Date Initiated: 09/07/2021 Target Resolution Date: 10/16/2021 Goal Status: Active Patient/caregiver will verbalize understanding of skin care regimen Date Initiated: 09/07/2021 Target Resolution Date: 09/18/2021 Goal Status: Active Ulcer/skin breakdown will have a volume reduction of 30% by week 4 Date Initiated: 09/07/2021 Target Resolution Date: 10/05/2021 Goal Status: Active Ulcer/skin breakdown will have a volume reduction of 50% by week 8 Date Initiated: 09/07/2021 Target Resolution Date:  11/02/2021 Goal Status: Active Ulcer/skin breakdown will have a volume reduction of 80% by week 12 Date Initiated: 09/07/2021 Target Resolution Date: 11/30/2021 Goal Status: Active Ulcer/skin breakdown will heal within 14 weeks Date Initiated: 09/07/2021 Target Resolution Date: 12/14/2021 Goal Status: Active Interventions: Assess patient/caregiver ability to obtain necessary supplies Assess patient/caregiver ability to perform ulcer/skin care regimen upon admission and as needed Assess ulceration(s) every visit Notes: Electronic Signature(s) IZACC, DEMEYER ([id number]) Signed: 12/28/2021 4:46:59 PM By: Angelina Pih Signed: 12/30/2021 8:34:35 AM By: [name] Entered By: [name] on 12/28/2021 10:07:14 Jeremy [name] ([id number]) -------------------------------------------------------------------------------- Pain Assessment Details Patient Name: Jeremy [name] Date of Service: 12/28/2021 9:45 AM Medical Record Number: [id number] Patient Account Number: [id number] Date of Birth/Sex: [birth year] (65 y.o. M) Treating RN: Angelina Pih Primary Care Anas Reister: Barbette Reichmann Other Clinician: [name] Referring Hadasah Brugger: Barbette Reichmann Treating Arihanna Estabrook/Extender: Rowan Blase in Treatment: 16 Active Problems Location of Pain Severity and Description of Pain Patient Has Paino No Site Locations Pain Management and Medication Current Pain Management: Electronic Signature(s) Signed: 12/28/2021 4:46:59 PM By: Angelina Pih Signed: 12/30/2021 8:34:35 AM By: [name] Entered By: [name] on 12/28/2021 09:56:17 Jeremy [name] ([id number]) -------------------------------------------------------------------------------- Patient/Caregiver Education Details Patient Name: Jeremy [name] Date of Service: 12/28/2021 9:45 AM Medical Record Number: [id number] Patient Account Number: [id number] Date of Birth/Gender: 1955-07-10 (65 y.o. M) Treating RN:  Angelina Pih Primary Care Physician: Barbette Reichmann Other Clinician: [name] Referring Physician: Barbette Reichmann Treating Physician/Extender: Rowan Blase in Treatment: 16 Education Assessment Education Provided To: Patient Education Topics Provided Infection: Handouts: Infection Prevention and Management Methods: Explain/Verbal Responses: State content correctly Electronic Signature(s) Signed: 12/30/2021 8:34:35 AM By: [name] Entered By: [name] on 12/28/2021 10:31:35 Jeremy [name] ([id number]) -------------------------------------------------------------------------------- Wound Assessment Details Patient Name: Jeremy [name] Date of Service: 12/28/2021 9:45 AM Medical Record Number: [id number] Patient Account Number: [id number] Date of Birth/Sex: [birth year] (65 y.o. M) Treating RN: Angelina Pih Primary Care Zackory Pudlo: Barbette Reichmann Other Clinician: [name] Referring Khiara Shuping: Barbette Reichmann Treating Sanskriti Greenlaw/Extender: Rowan Blase in Treatment: 16 Wound Status Wound Number: 3 Primary Refractory Osteomyelitis Etiology: Wound Location: Right, Lateral Ankle Secondary Diabetic Wound/Ulcer of the Lower Extremity Wounding Event: Gradually Appeared Etiology: Date Acquired: 08/24/2021 Wound Open Weeks Of Treatment: 16 Status: Clustered Wound: No Comorbid Arrhythmia, Congestive Heart Failure, Coronary Artery Pending Amputation On Presentation History: Disease, Hypertension, Type II Diabetes, Osteoarthritis, Osteomyelitis Photos Wound Measurements Length: (cm) 1.3 Width: (cm) 0.9 Depth: (cm) 1.5 Area: (cm) 0.919 Volume: (cm) 1.378 % Reduction in Area: -2864.5% % Reduction in Volume: -6163.6% Epithelialization: Small (1-33%) Undermining: Yes Starting Position (o'clock): 12 Ending Position (o'clock): 12 Maximum Distance: (cm) 1.5 Wound Description Classification: Full Thickness With Exposed Support  Structures Exudate Amount: Medium Exudate Type: Purulent Exudate Color: yellow, brown, green Foul Odor After Cleansing: No Slough/Fibrino Yes Wound Bed Granulation Amount: Medium (34-66%) Exposed Structure Granulation Quality: Pink Fascia Exposed: No Necrotic Amount: Small (1-33%) Fat Layer (Subcutaneous Tissue) Exposed:  Yes Necrotic Quality: Adherent Slough Tendon Exposed: No Muscle Exposed: No Joint Exposed: No Bone Exposed: Yes Treatment Notes Wound #3 (Ankle) Wound Laterality: Right, Lateral Cleanser Hillmer, Terrian ([id number]) Normal Saline Discharge Instruction: Wash your hands with soap and water. Remove old dressing, discard into plastic bag and place into trash. Cleanse the wound with Normal Saline prior to applying a clean dressing using gauze sponges, not tissues or cotton balls. Do not scrub or use excessive force. Pat dry using gauze sponges, not tissue or cotton balls. Peri-Wound Care Topical Primary Dressing Hydrofera Blue Ready Transfer Foam, 2.5x2.5 (in/in) Discharge Instruction: Apply Hydrofera Blue Ready to wound bed as directed Secondary Dressing (SILICONE BORDER) Zetuvit Plus SILICONE BORDER Dressing 4x4 (in/in) Discharge Instruction: Please do not put silicone bordered dressings under wraps. Use non-bordered dressing only. Secured With Compression Wrap Compression Stockings Add-Ons Electronic Signature(s) Signed: 12/28/2021 4:46:59 PM By: Angelina Pih Signed: 12/30/2021 8:34:35 AM By: [name] Entered By: [name] on 12/28/2021 10:06:48 Jeremy [name] ([id number]) -------------------------------------------------------------------------------- Vitals Details Patient Name: Jeremy [name] Date of Service: 12/28/2021 9:45 AM Medical Record Number: [id number] Patient Account Number: [id number] Date of Birth/Sex: 12-Sep-1955 (65 y.o. M) Treating RN: Angelina Pih Primary Care Demira Gwynne: Barbette Reichmann Other Clinician: [name] Referring Donovan Persley: Barbette Reichmann Treating Luda Charbonneau/Extender: Rowan Blase in Treatment: 16 Vital Signs Time Taken: 09:53 Temperature (F): 97.9 Height (in): 71 Pulse (bpm): 118 Weight (lbs): 250 Respiratory Rate (breaths/min): 16 Body Mass Index (BMI): 34.9 Blood Pressure (mmHg): 152/73 Reference Range: 80 - 120 mg / dl Electronic Signature(s) Signed: 12/30/2021 8:34:35 AM By: [name] Entered By: [name] on 12/28/2021 09:56:07

## 2022-01-11 ENCOUNTER — Encounter: Payer: Medicaid Other | Admitting: Physician Assistant

## 2022-01-11 DIAGNOSIS — E11622 Type 2 diabetes mellitus with other skin ulcer: Secondary | ICD-10-CM | POA: Diagnosis not present

## 2022-01-11 NOTE — Progress Notes (Signed)
Jeremy [name], Jeremy [name] ([id number]) Visit Report for 01/11/2022 Chief Complaint Document Details Patient Name: Jeremy [name], Jeremy [name] Date of Service: 01/11/2022 9:45 AM Medical Record Number: [id number] Patient Account Number: [id number] Date of Birth/Sex: 02-14-1956 (66 y.o. M) Treating RN: Huel Coventry Primary Care Provider: Barbette Reichmann Other Clinician: [name] Referring Provider: Barbette Reichmann Treating Provider/Extender: Rowan Blase in Treatment: 18 Information Obtained from: Patient Chief Complaint Right ankle ulcer Electronic Signature(s) Signed: 01/11/2022 10:20:18 AM By: Lenda Kelp PA-C Entered By: Lenda Kelp on 01/11/2022 10:20:18 Jeremy [name] ([id number]) -------------------------------------------------------------------------------- Problem List Details Patient Name: Jeremy [name] Date of Service: 01/11/2022 9:45 AM Medical Record Number: [id number] Patient Account Number: [id number] Date of Birth/Sex: 1955-09-15 (65 y.o. 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

## 2022-01-12 NOTE — Progress Notes (Signed)
BLAND, RUDZINSKI ([id number]) Visit Report for 01/11/2022 Arrival Information Details Patient Name: Jeremy [name], Jeremy [name] Date of Service: 01/11/2022 9:45 AM Medical Record Number: [id number] Patient Account Number: [id number] Date of Birth/Sex: 1955/10/30 (66 y.o. M) Treating RN: Huel Coventry Primary Care Sayf Kerner: Barbette Reichmann Other Clinician: [name] Referring Christifer Chapdelaine: Barbette Reichmann Treating Ruairi Stutsman/Extender: Rowan Blase in Treatment: 18 Visit Information History Since Last Visit All ordered tests and consults were completed: No Patient Arrived: Ambulatory Added or deleted any medications: No Arrival Time: 10:08 Any new allergies or adverse reactions: No Transfer Assistance: None Had a fall or experienced change in No Patient Requires Transmission-Based No activities of daily living that may affect Precautions: risk of falls: Patient Has Alerts: Yes Hospitalized since last visit: No Patient Alerts: Patient on Blood Pain Present Now: No Thinner Eliquis Electronic Signature(s) Signed: 01/12/2022 3:08:50 PM By: [name] Entered By: [name] on 01/11/2022 10:08:58 Jeremy [name] ([id number]) -------------------------------------------------------------------------------- Clinic Level of Care Assessment Details Patient Name: Jeremy [name] Date of Service: 01/11/2022 9:45 AM Medical Record Number: [id number] Patient Account Number: [id number] Date of Birth/Sex: [birth year] (65 y.o. M) Treating RN: Huel Coventry Primary Care Lakeith Careaga: Barbette Reichmann Other Clinician: [name] Referring Draysen Weygandt: Barbette Reichmann Treating Jaela Yepez/Extender: Rowan Blase in Treatment: 18 Clinic Level of Care Assessment Items TOOL 1 Quantity Score []  - Use when EandM and Procedure is performed on INITIAL visit 0 ASSESSMENTS - Nursing Assessment / Reassessment []  - General Physical Exam (combine w/ comprehensive assessment (listed just below) when  performed on new 0 pt. evals) []  - 0 Comprehensive Assessment (HX, ROS, Risk Assessments, Wounds Hx, etc.) ASSESSMENTS - Wound and Skin Assessment / Reassessment []  - Dermatologic / Skin Assessment (not related to wound area) 0 ASSESSMENTS - Ostomy and/or Continence Assessment and Care []  - Incontinence Assessment and Management 0 []  - 0 Ostomy Care Assessment and Management (repouching, etc.) PROCESS - Coordination of Care []  - Simple Patient / Family Education for ongoing care 0 []  - 0 Complex (extensive) Patient / Family Education for ongoing care []  - 0 Staff obtains , Records, Test Results / Process Orders []  - 0 Staff telephones HHA, Nursing Homes / Clarify orders / etc []  - 0 Routine Transfer to another Facility (non-emergent condition) []  - 0 Routine Hospital Admission (non-emergent condition) []  - 0 New Admissions / / Ordering NPWT, Apligraf, etc. []  - 0 Emergency Hospital Admission (emergent condition) PROCESS - Special Needs []  - Pediatric / Minor Patient Management 0 []  - 0 Isolation Patient Management []  - 0 Hearing / Language / Visual special needs []  - 0 Assessment of Community assistance (transportation, D/C planning, etc.) []  - 0 Additional assistance / Altered mentation []  - 0 Support Surface(s) Assessment (bed, cushion, seat, etc.) INTERVENTIONS - Miscellaneous []  - External ear exam 0 []  - 0 Patient Transfer (multiple staff / / Similar devices) []  - 0 Simple Staple / Suture removal (25 or less) []  - 0 Complex Staple / Suture removal (26 or more) []  - 0 Hypo/Hyperglycemic Management (do not check if billed separately) []  - 0 Ankle / Brachial Index (ABI) - do not check if billed separately Has the patient been seen at the hospital within the last three years: Yes Total Score: 0 Level Of Care: ____ ( ) Electronic Signature(s) Signed: 01/12/2022 3:08:50 PM By: Entered By: on 01/11/2022 10:30:03 Chiropractor ( ) -------------------------------------------------------------------------------- Encounter Discharge Information Details Patient Name: Date of Service: 01/11/2022 9:45 AM Medical  Record Number: [id number] Patient Account Number: [id number] Date of Birth/Sex: 01/05/1956 (65 y.o. M) Treating RN: Cornell Barman Primary Care Kainen Struckman: Tracie Harrier Other Clinician: Massie Kluver Referring Kaiyden Simkin: Tracie Harrier Treating Marijah Larranaga/Extender: Skipper Cliche in Treatment: 18 Encounter Discharge Information Items Discharge Condition: Stable Ambulatory Status: Ambulatory Discharge Destination: Home Transportation: Private Auto Accompanied By: self Schedule Follow-up Appointment: Yes Clinical Summary of Care: Electronic Signature(s) Signed: 01/12/2022 3:08:50 PM By: Massie Kluver Entered By: Massie Kluver on 01/11/2022 10:37:20 Jeremy [name] ([id number]) -------------------------------------------------------------------------------- Lower Extremity Assessment Details Patient Name: Jeremy [name] Date of Service: 01/11/2022 9:45 AM Medical Record Number: [id number] Patient Account Number: [id number] Date of Birth/Sex: Jan 03, 1956 (65 y.o. M) Treating RN: Cornell Barman Primary Care Delmy Holdren: Tracie Harrier Other Clinician: Massie Kluver Referring Evona Westra: Tracie Harrier Treating Demarrio Menges/Extender: Skipper Cliche in Treatment: 18 Electronic Signature(s) Signed: 01/11/2022 5:09:07 PM By: Gretta Cool BSN, RN, CWS, Kim RN, BSN Signed: 01/12/2022 3:08:50 PM By: Massie Kluver Entered By: Massie Kluver on 01/11/2022 10:21:17 Jeremy [name] ([id number]) -------------------------------------------------------------------------------- Multi Wound Chart Details Patient Name: Jeremy [name] Date of Service: 01/11/2022 9:45 AM Medical Record Number: [id number] Patient Account  Number: [id number] Date of Birth/Sex: 1955-09-05 (65 y.o. M) Treating RN: Cornell Barman Primary Care Tessia Kassin: Tracie Harrier Other Clinician: Massie Kluver Referring Makella Buckingham: Tracie Harrier Treating Joseth Weigel/Extender: Skipper Cliche in Treatment: 18 Vital Signs Height(in): 71 Pulse(bpm): 120 Weight(lbs): 250 Blood Pressure(mmHg): 145/96 Body Mass Index(BMI): 34.9 Temperature(F): 97.8 Respiratory Rate(breaths/min): 18 Photos: [N/A:N/A] Wound Location: Right, Lateral Ankle N/A N/A Wounding Event: Gradually Appeared N/A N/A Primary Etiology: Refractory Osteomyelitis N/A N/A Secondary Etiology: Diabetic Wound/Ulcer of the Lower N/A N/A Extremity Comorbid History: Arrhythmia, Congestive Heart N/A N/A Failure, Coronary Artery Disease, Hypertension, Type II Diabetes, Osteoarthritis, Osteomyelitis Date Acquired: 08/24/2021 N/A N/A Weeks of Treatment: 18 N/A N/A Wound Status: Open N/A N/A Wound Recurrence: No N/A N/A Pending Amputation on Yes N/A N/A Presentation: Measurements L x W x D (cm) 0.5x0.5x0.6 N/A N/A Area (cm) : 0.196 N/A N/A Volume (cm) : 0.118 N/A N/A % Reduction in Area: -532.30% N/A N/A % Reduction in Volume: -436.40% N/A N/A Classification: Full Thickness With Exposed N/A N/A Support Structures Exudate Amount: Medium N/A N/A Exudate Type: Purulent N/A N/A Exudate Color: yellow, brown, green N/A N/A Granulation Amount: Medium (34-66%) N/A N/A Granulation Quality: Pink N/A N/A Necrotic Amount: Small (1-33%) N/A N/A Exposed Structures: Fat Layer (Subcutaneous Tissue): N/A N/A Yes Bone: Yes Fascia: No Tendon: No Muscle: No Joint: No Epithelialization: Small (1-33%) N/A N/A Treatment Notes Electronic Signature(sZYEN, ACKER ([id number]) Signed: 01/12/2022 3:08:50 PM By: Massie Kluver Entered By: Massie Kluver on 01/11/2022 10:21:30 Jeremy [name]  ([id number]) -------------------------------------------------------------------------------- Multi-Disciplinary Care Plan Details Patient Name: Jeremy [name] Date of Service: 01/11/2022 9:45 AM Medical Record Number: [id number] Patient Account Number: [id number] Date of Birth/Sex: 1955/08/01 (65 y.o. M) Treating RN: Cornell Barman Primary Care Nyoka Alcoser: Tracie Harrier Other Clinician: Massie Kluver Referring Lanah Steines: Tracie Harrier Treating Jayde Mcallister/Extender: Skipper Cliche in Treatment: 18 Active Inactive Osteomyelitis Nursing Diagnoses: Knowledge deficit related to disease process and management Potential for infection: osteomyelitis Goals: Patient/caregiver will verbalize understanding of disease process and disease management Date Initiated: 09/07/2021 Target Resolution Date: 09/18/2021 Goal Status: Active Interventions: Assess for signs and symptoms of osteomyelitis resolution every visit Provide education on osteomyelitis Notes: Wound/Skin Impairment Nursing Diagnoses: Impaired tissue integrity Knowledge deficit related to smoking impact on wound healing Knowledge deficit related to ulceration/compromised skin integrity Goals: Patient will demonstrate a reduced rate of smoking or cessation of smoking Date Initiated: 09/07/2021 Target  Resolution Date: 10/16/2021 Goal Status: Active Patient/caregiver will verbalize understanding of skin care regimen Date Initiated: 09/07/2021 Target Resolution Date: 09/18/2021 Goal Status: Active Ulcer/skin breakdown will have a volume reduction of 30% by week 4 Date Initiated: 09/07/2021 Target Resolution Date: 10/05/2021 Goal Status: Active Ulcer/skin breakdown will have a volume reduction of 50% by week 8 Date Initiated: 09/07/2021 Target Resolution Date: 11/02/2021 Goal Status: Active Ulcer/skin breakdown will have a volume reduction of 80% by week 12 Date Initiated: 09/07/2021 Target Resolution Date: 11/30/2021 Goal Status:  Active Ulcer/skin breakdown will heal within 14 weeks Date Initiated: 09/07/2021 Target Resolution Date: 12/14/2021 Goal Status: Active Interventions: Assess patient/caregiver ability to obtain necessary supplies Assess patient/caregiver ability to perform ulcer/skin care regimen upon admission and as needed Assess ulceration(s) every visit Notes: Electronic Signature(s) JULE, DESTEFANO ([id number]) Signed: 01/11/2022 5:09:07 PM By: Gretta Cool, BSN, RN, CWS, Kim RN, BSN Signed: 01/12/2022 3:08:50 PM By: Massie Kluver Entered By: Massie Kluver on 01/11/2022 10:21:22 Jeremy [name] ([id number]) -------------------------------------------------------------------------------- Pain Assessment Details Patient Name: Jeremy [name] Date of Service: 01/11/2022 9:45 AM Medical Record Number: [id number] Patient Account Number: [id number] Date of Birth/Sex: 26-Feb-1956 (65 y.o. M) Treating RN: Cornell Barman Primary Care Aviya Jarvie: Tracie Harrier Other Clinician: Massie Kluver Referring Nancey Kreitz: Tracie Harrier Treating Porche Steinberger/Extender: Skipper Cliche in Treatment: 18 Active Problems Location of Pain Severity and Description of Pain Patient Has Paino No Site Locations Pain Management and Medication Current Pain Management: Electronic Signature(s) Signed: 01/11/2022 5:09:07 PM By: Gretta Cool, BSN, RN, CWS, Kim RN, BSN Signed: 01/12/2022 3:08:50 PM By: Massie Kluver Entered By: Massie Kluver on 01/11/2022 10:12:59 Jeremy [name] ([id number]) -------------------------------------------------------------------------------- Patient/Caregiver Education Details Patient Name: Jeremy [name] Date of Service: 01/11/2022 9:45 AM Medical Record Number: [id number] Patient Account Number: [id number] Date of Birth/Gender: 1956-05-09 (65 y.o. M) Treating RN: Cornell Barman Primary Care Physician: Tracie Harrier Other Clinician: Massie Kluver Referring Physician: Tracie Harrier Treating  Physician/Extender: Skipper Cliche in Treatment: 18 Education Assessment Education Provided To: Patient Education Topics Provided Infection: Handouts: Other: continue Doxycycline Methods: Explain/Verbal Responses: State content correctly Wound/Skin Impairment: Handouts: Other: continue wound care as directed Methods: Explain/Verbal Responses: State content correctly Electronic Signature(s) Signed: 01/12/2022 3:08:50 PM By: Massie Kluver Entered By: Massie Kluver on 01/11/2022 10:30:52 Jeremy [name] ([id number]) -------------------------------------------------------------------------------- Wound Assessment Details Patient Name: Jeremy [name] Date of Service: 01/11/2022 9:45 AM Medical Record Number: [id number] Patient Account Number: [id number] Date of Birth/Sex: [birth year] (65 y.o. M) Treating RN: Cornell Barman Primary Care Tymarion Everard: Tracie Harrier Other Clinician: Massie Kluver Referring Ryian Lynde: Tracie Harrier Treating Georgie Eduardo/Extender: Skipper Cliche in Treatment: 18 Wound Status Wound Number: 3 Primary Refractory Osteomyelitis Etiology: Wound Location: Right, Lateral Ankle Secondary Diabetic Wound/Ulcer of the Lower Extremity Wounding Event: Gradually Appeared Etiology: Date Acquired: 08/24/2021 Wound Open Weeks Of Treatment: 18 Status: Clustered Wound: No Comorbid Arrhythmia, Congestive Heart Failure, Coronary Artery Pending Amputation On Presentation History: Disease, Hypertension, Type II Diabetes, Osteoarthritis, Osteomyelitis Photos Wound Measurements Length: (cm) 0.5 Width: (cm) 0.5 Depth: (cm) 0.6 Area: (cm) 0.196 Volume: (cm) 0.118 % Reduction in Area: -532.3% % Reduction in Volume: -436.4% Epithelialization: Small (1-33%) Wound Description Classification: Full Thickness With Exposed Support Structures Exudate Amount: Medium Exudate Type: Purulent Exudate Color: yellow, brown, green Foul Odor After Cleansing:  No Slough/Fibrino Yes Wound Bed Granulation Amount: Medium (34-66%) Exposed Structure Granulation Quality: Pink Fascia Exposed: No Necrotic Amount: Small (1-33%) Fat Layer (Subcutaneous Tissue) Exposed: Yes Necrotic Quality: Adherent Slough Tendon Exposed: No Muscle Exposed: No Joint Exposed: No Bone Exposed: Yes Treatment Notes Wound #3 (  Ankle) Wound Laterality: Right, Lateral Cleanser Normal Saline Discharge Instruction: Wash your hands with soap and water. Remove old dressing, discard into plastic bag and place into trash. Cleanse the wound with Normal Saline prior to applying a clean dressing using gauze sponges, not tissues or cotton balls. Do not Game, Joshoa ([id number]) scrub or use excessive force. Pat dry using gauze sponges, not tissue or cotton balls. Peri-Wound Care Topical Primary Dressing Hydrofera Blue Ready Transfer Foam, 2.5x2.5 (in/in) Discharge Instruction: Apply Hydrofera Blue Ready to wound bed as directed Secondary Dressing (SILICONE BORDER) Zetuvit Plus SILICONE BORDER Dressing 4x4 (in/in) Discharge Instruction: Please do not put silicone bordered dressings under wraps. Use non-bordered dressing only. Secured With Compression Wrap Compression Stockings Facilities manager) Signed: 01/11/2022 5:09:07 PM By: Elliot Gurney, BSN, RN, CWS, Kim RN, BSN Signed: 01/12/2022 3:08:50 PM By: [name] Entered By: [name] on 01/11/2022 10:21:06 Jeremy [name] ([id number]) -------------------------------------------------------------------------------- Vitals Details Patient Name: Jeremy [name] Date of Service: 01/11/2022 9:45 AM Medical Record Number: [id number] Patient Account Number: [id number] Date of Birth/Sex: 1956/01/19 (65 y.o. M) Treating RN: Huel Coventry Primary Care Viona Hosking: Barbette Reichmann Other Clinician: [name] Referring Titiana Severa: Barbette Reichmann Treating Freman Lapage/Extender: Rowan Blase in Treatment: 18 Vital  Signs Time Taken: 10:09 Temperature (F): 97.8 Height (in): 71 Pulse (bpm): 120 Weight (lbs): 250 Respiratory Rate (breaths/min): 18 Body Mass Index (BMI): 34.9 Blood Pressure (mmHg): 145/96 Reference Range: 80 - 120 mg / dl Electronic Signature(s) Signed: 01/12/2022 3:08:50 PM By: [name] Entered By: [name] on 01/11/2022 10:12:44

## 2022-01-25 ENCOUNTER — Encounter: Payer: Medicaid Other | Attending: Physician Assistant | Admitting: Physician Assistant

## 2022-01-25 DIAGNOSIS — M86471 Chronic osteomyelitis with draining sinus, right ankle and foot: Secondary | ICD-10-CM | POA: Diagnosis not present

## 2022-01-25 DIAGNOSIS — I2510 Atherosclerotic heart disease of native coronary artery without angina pectoris: Secondary | ICD-10-CM | POA: Insufficient documentation

## 2022-01-25 DIAGNOSIS — E11622 Type 2 diabetes mellitus with other skin ulcer: Secondary | ICD-10-CM | POA: Insufficient documentation

## 2022-01-25 DIAGNOSIS — L97316 Non-pressure chronic ulcer of right ankle with bone involvement without evidence of necrosis: Secondary | ICD-10-CM | POA: Diagnosis not present

## 2022-01-25 DIAGNOSIS — I10 Essential (primary) hypertension: Secondary | ICD-10-CM | POA: Diagnosis not present

## 2022-01-28 NOTE — Progress Notes (Addendum)
BADR, PIEDRA ([id number]) Visit Report for 01/25/2022 Chief Complaint Document Details Patient Name: Jeremy [name], Jeremy [name] Date of Service: 01/25/2022 9:45 AM Medical Record Number: [id number] Patient Account Number: [id number] Date of Birth/Sex: 09/15/1955 (65 y.o. M) Treating RN: Huel Coventry Primary Care Provider: Barbette Reichmann Other Clinician: [name] Referring Provider: Barbette Reichmann Treating Provider/Extender: Rowan Blase in Treatment: 20 Information Obtained from: Patient Chief Complaint Right ankle ulcer Electronic Signature(s) Signed: 01/25/2022 10:07:01 AM By: Lenda Kelp PA-C Entered By: Lenda Kelp on 01/25/2022 10:07:01 Jeremy [name] ([id number]) -------------------------------------------------------------------------------- HPI Details Patient Name: Jeremy [name] Date of Service: 01/25/2022 9:45 AM Medical Record Number: [id number] Patient Account Number: [id number] Date of Birth/Sex: November 22, 1955 (65 y.o. M) Treating RN: Huel Coventry Primary Care Provider: Barbette Reichmann Other Clinician: [name] Referring Provider: Barbette Reichmann Treating Provider/Extender: Rowan Blase in Treatment: 20 History of Present Illness HPI Description: 08/18/2020 upon evaluation today patient presents for initial inspection here in our clinic concerning issues has been having with his right lateral ankle and this has been present for at least a year he tells me. He is a patient of Dr. Orland Jarred. Subsequently Dr. Orland Jarred has since retired hence the patient look this up and is coming here for wound care to see if we can help him out. He did have a screw pushed out of this location and since that time tells me that the screw was removed but nonetheless the hole has remained. Fortunately there does not appear to be any signs of active infection systemically at this point though it does raise the question as to whether or not this might be a  bone/hardware infection being that this has been open for so long. The patient does have a history of diabetes mellitus type 2, coronary artery disease, hypertension, and unfortunately does not seem to be doing as great as I would like to see him regard to his left ankle. 08/25/2020 upon evaluation today patient appears to be doing well with regard to his ankle ulcer compared to last week this is definitely smaller. With that being said it still does probe down to bone/hardware. I still think this can be appropriate for him to see the orthopedic specialist. He has not heard from them yet he tells me. 09/05/2020 upon evaluation today patient appears to be doing extremely well in regard to his wound all things considered. I do not see that this is gotten any worse. Unfortunately also do not think he is gotten any better. We did make a referral to orthopedics to Dr. Victorino Dike specifically but the patient tells me that he has not heard from them. That is unusual as they normally get in touch with patients fairly quickly. I will ask him if he possibly missed a phone call he tells me he will check when he gets home. Nonetheless he does not want to go to [facility] anyway which he did not tell me previously therefore we will get a see about making a referral to Dr. Logan Bores to see what he potentially could recommend for the patient as well I think Dr. Logan Bores is excellent and a good option here. Readmission: 10/28/2020 upon evaluation today patient appears to be doing about the same as when I last saw him. He did not come back for follow-up last time I saw him was 09/05/2020. Basically he tells me that he has been attempting to manage this on his own using the Hydrofera Blue rope. He did get a call from [facility] but they  did not actually get him scheduled due to the fact that the patient told me that he told them he did not wanted to see them he just wanted to come back and see me because he was happy with my  care. Nonetheless as I explained to the patient he has a fractured screw at the site of the wound he also has another screw where there appears to be some loosening of the screw and potential for hardware/bone infection here. Subsequently I think he really does need to see a specialist to see what they can do to help him out I do not believe him to be able to get this healed without taking care of the hardware issues. 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 He has been advised previous of a need for an above-knee amputation due to some of the aggressive breakdown in the ankle region. Nonetheless he has had the removal of the screw performed in office with Dr. Luana Shu and this was on 08/28/2021. Subsequently the patient is a poor historian he also gets upset very easily. Of note I do believe that he is going to require some dressings to be packed into the area due to the fact that to be honest this is still tracking all the way straight down to bone where there was pus and purulence coming out once I did get down to that area. Subsequently he tells me that he is having pain although it does seem to be doing better since the screw was removed. 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 Therefore his best options probably do entail the continue following up by Dr. Ola Spurr for infectious disease coupled with wound care as best we can and try to see if we keep this clean as much as possible and keep it from becoming more infected. At some point there may be a time where this could continually get worse and may end up not doing nearly as well but right now he does not seem to be doing too poorly and I think this is the best way to support him. I am not overly  confident that this is ever getting heal however. 09/15/21 upon evaluation today patient appears to be doing well with regard to his wound. He has been tolerating the dressing changes without complication. Fortunately I do not see any signs of active infection at this time. Overall I think that he is doing quite well. 09-22-2021 upon evaluation today patient appears to be doing about the same in regard to his ankle. 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 He again has been told that there is probably not a likely scenario in which this is able to heal. Nonetheless he is not a HBO candidate due to low ejection fraction. He does see Dr. Ola Spurr he still taking antibiotics at this point we will try to keep the area open so does not develop an abscess. 09-29-2021 upon evaluation today patient appears to be doing well with regard to his wound all things considered. He continues to use the [employer] rope. This is still a fairly deep wound that I think the rope is doing well to help  keep the drainage from collecting in the base of the wound there does appear to be less drainage definitely not as purulent as what we have noticed in the past. There is no increased pain and no signs of worsening in general. 10-06-2021 upon evaluation today patient's wound is continuing to show signs of issues here at this point. Fortunately I do not see any evidence of active infection locally or systemically which is great news but at the same time he is definitely having some ongoing problems here currently. 5/8; 2-week follow-up. Right medial ankle. Small wound purulent drainage. He is on doxycycline chronically for underlying osteomyelitis. He has hardware in this area. He is using Hydrofera Blue packing strips 11-02-2021 upon evaluation today patient appears to be doing much better in regard to his wound I am very pleased to see this.  I do not see any evidence of active infection locally nor systemically which is great news. No fevers, chills, nausea, vomiting, or diarrhea. Jeremy [name], Jeremy [name] ([id number]) 11-16-2021 upon evaluation today patient appears to be doing well with regard to his ankle ulcer this is a very tiny area remaining at this point. Fortunately I do not see any evidence of infection locally or systemically at this time. Overall I think we are on the right track. 11-30-2021 Upon inspection patient's wound bed actually showed signs of having just a very small opening that still between 1 to 2 mm max. The back end of the sterile Q-tip stick I was able to get down in here and it does still probe down to bone. With that being said he still has drainage as well. Nonetheless he has not wanted any further surgical intervention and so mainly is just more of something were doing to try to manage this as best we can do only thing really were doing a split Hydrofera Blue externally has a lot of new skin covering over where previously had a lot of open area but at the same time I am still concerned about the fact this probes all the way down to bone. 12-14-2021 upon evaluation today patient appears to be doing about the same in regard to his wound. This still actually probes down to bone. He has not seen ID anytime recently. Has been off of the doxycycline for quite some time. For that reason I really feel like we may need to see about getting something done a little bit more specific for him here and when to try to obtain a culture today so that we can see what that shows and maybe get him on antibiotics that could help this to heal faster. He is in agreement with that plan. 12-28-2021 upon evaluation today patient appears to be doing worse in regard to his wound. Unfortunately this has sealed up and does appear to be trapping quite a bit of fluid underneath the skin the skin is loose and honestly needs to be trimmed away I discussed  that with the patient today. He voiced understanding and did consent to the debridement. This is good to be done with scissors and forceps. 01-11-2022 upon evaluation today patient's wound actually is showing signs of being a little better than last time I saw him. I do believe that the doxycycline has been effective in calming this down to some degree. Fortunately I do not see any evidence of active infection at this time systemically although locally he is actually having some signs of cellulitis though this is much better compared to 2 weeks ago when  I last saw him. 01-25-2022 upon evaluation today patient appears to be doing well with regard to his foot ulcer all things considered. He still has a wound that goes deeper I think switching back to the [facility] rope is probably can to be better for him at this point. Electronic Signature(s) Signed: 01/25/2022 10:24:49 AM By: Lenda Kelp PA-C Entered By: Lenda Kelp on 01/25/2022 10:24:48 Jeremy [name] ([id number]) -------------------------------------------------------------------------------- Physical Exam Details Patient Name: Jeremy [name] Date of Service: 01/25/2022 9:45 AM Medical Record Number: [id number] Patient Account Number: [id number] Date of Birth/Sex: [birth year] (65 y.o. M) Treating RN: Huel Coventry Primary Care Provider: Barbette Reichmann Other Clinician: [name] Referring Provider: Barbette Reichmann Treating Provider/Extender: Rowan Blase in Treatment: 20 Constitutional Well-nourished and well-hydrated in no acute distress. Respiratory normal breathing without difficulty. Psychiatric this patient is able to make decisions and demonstrates good insight into disease process. Alert and Oriented x 3. pleasant and cooperative. Notes Upon inspection patient's wound bed actually showed signs of good granulation and epithelization at this point. Fortunately I do not see any evidence of infection locally  or systemically which is great news and overall I am extremely pleased with where we stand. Electronic Signature(s) Signed: 01/25/2022 10:26:09 AM By: Lenda Kelp PA-C Entered By: Lenda Kelp on 01/25/2022 10:26:09 Jeremy [name] ([id number]) -------------------------------------------------------------------------------- Physician Orders Details Patient Name: Jeremy [name] Date of Service: 01/25/2022 9:45 AM Medical Record Number: [id number] Patient Account Number: [id number] Date of Birth/Sex: [birth year] (65 y.o. 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 Bathing/ Shower/ Hygiene o May shower; gently cleanse wound with antibacterial soap, rinse and pat dry prior to dressing wounds o No tub bath. Edema Control - Lymphedema / Segmental Compressive Device / Other o Elevate leg(s) parallel to the floor when sitting. o DO YOUR BEST to sleep in the bed at night. DO NOT sleep in your recliner. Long hours of sitting in a recliner leads to swelling of the legs and/or potential wounds on your backside. Additional Orders / Instructions o Follow Nutritious Diet and Increase Protein Intake - monitor blood glucose to maintain normal level Culture obtained today Medications-Please add to medication list. o P.O. Antibiotics - continue  Doxycycline Wound Treatment Wound #3 - Ankle Wound Laterality: Right, Lateral Cleanser: Normal Saline 1 x Per Day/30 Days Discharge Instructions: Wash your hands with soap and water. Remove old dressing, discard into plastic bag and place into trash. Cleanse the wound with Normal Saline prior to applying a clean dressing using gauze sponges, not tissues or cotton balls. Do not scrub or use excessive force. Pat dry using gauze sponges, not tissue or cotton balls. Primary Dressing: Hydrofera Blue Classic Foam Rope Dressing, 9x6 (mm/in) 1 x Per Day/30 Days Secondary Dressing: Telfa Adhesive Island Dressing, 4x4 (in/in) 1 x Per Day/30 Days Discharge Instructions: Apply over dressing to secure in place. Electronic Signature(s) Signed: 01/25/2022 4:45:05 PM By: Lenda Kelp PA-C Signed: 01/27/2022 1:47:09 PM By: [name] Entered By: [name] on 01/25/2022  10:28:49 Jeremy [name], Jeremy [name] ([id number]) -------------------------------------------------------------------------------- Problem List Details Patient Name: Jeremy [name], Jeremy [name] Date of Service: 01/25/2022 9:45 AM Medical Record Number: [id number] Patient Account Number: [id number] Date of Birth/Sex: 10/25/1955 (65 y.o. 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 without 09/07/2021 No Yes evidence of necrosis E11.622 Type 2 diabetes mellitus with other skin ulcer 09/07/2021 No Yes I10 Essential (primary) hypertension 09/07/2021 No Yes I25.10 Atherosclerotic heart disease of native coronary artery without angina 09/07/2021 No Yes pectoris Inactive  Problems Resolved Problems Electronic Signature(s) Signed: 01/25/2022 10:02:30 AM By: Lenda Kelp PA-C Entered By: Lenda Kelp on 01/25/2022 10:02:30 Jeremy [name] ([id number]) -------------------------------------------------------------------------------- Progress Note Details Patient Name: Jeremy [name] Date of Service: 01/25/2022 9:45 AM Medical Record Number: [id number] Patient Account Number: [id number] Date of Birth/Sex: [birth year] (65 y.o. M) Treating RN: Huel Coventry Primary Care Provider: Barbette Reichmann Other Clinician: [name] Referring Provider: Barbette Reichmann Treating Provider/Extender: Rowan Blase in Treatment: 20 Subjective Chief Complaint Information obtained from Patient Right ankle ulcer History of Present Illness (HPI) 08/18/2020 upon evaluation today patient presents for initial inspection here in our clinic concerning issues has been having with his right lateral ankle and this has been present for at least a year he tells me. He is a patient of Dr. Orland Jarred. Subsequently Dr. Orland Jarred has since retired hence the patient look this up and is coming here for wound care to see if we can help him out. He did have a screw pushed out of this location and since that time tells me that the screw was removed but nonetheless the hole has remained. Fortunately there does not appear to be any signs of active infection systemically at this point though it does raise the question as to whether or not this might be a bone/hardware infection being that this has been open for so long. The patient does have a history of diabetes mellitus type 2, coronary artery disease, hypertension, and unfortunately does not seem to be doing as great as I would like to see him regard to his left ankle. 08/25/2020 upon evaluation today patient appears to be doing well with regard to his ankle ulcer compared to last week this is definitely smaller. With that being said it still  does probe down to bone/hardware. I still think this can be appropriate for him to see the orthopedic specialist. He has not heard from them yet he tells me. 09/05/2020 upon evaluation today patient appears to be doing extremely well in regard to his wound all things considered. I do not see that this is gotten any worse. Unfortunately also do not think he is gotten any better. We did make a referral to orthopedics to Dr. Victorino Dike specifically but the patient tells me that he has not heard from them. That is unusual as they normally get in touch with patients fairly quickly. I will ask him if he possibly missed a phone call he tells me he will check when he gets home. Nonetheless he does not want to go to [facility] anyway which he did not tell me previously therefore we will get a see about making a referral to Dr. Logan Bores to see what he potentially could recommend for the patient as  well I think Dr. Logan Bores is excellent and a good option here. Readmission: 10/28/2020 upon evaluation today patient appears to be doing about the same as when I last saw him. He did not come back for follow-up last time I saw him was 09/05/2020. Basically he tells me that he has been attempting to manage this on his own using the Hydrofera Blue rope. He did get a call from [facility] but they did not actually get him scheduled due to the fact that the patient told me that he told them he did not wanted to see them he just wanted to come back and see me because he was happy with my care. Nonetheless as I explained to the patient he has a fractured screw at the site of the wound he also has another screw where there appears to be some loosening of the screw and potential for hardware/bone infection here. Subsequently I think he really does need to see a specialist to see what they can do to help him out I do not believe him to be able to get this healed without taking care of the hardware issues. Readmission: 09/07/2021  upon evaluation today patient appears to be doing still poorly in regard to his ankle where he does have chronic osteomyelitis. He has been advised previous of a need for an above-knee amputation due to some of the aggressive breakdown in the ankle region. Nonetheless he has had the removal of the screw performed in office with Dr. Excell Seltzer and this was on 08/28/2021. Subsequently the patient is a poor historian he also gets upset very easily. Of note I do believe that he is going to require some dressings to be packed into the area due to the fact that to be honest this is still tracking all the way straight down to bone where there was pus and purulence coming out once I did get down to that area. Subsequently he tells me that he is having pain although it does seem to be doing better since the screw was removed. 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 Therefore his best options probably do entail the continue following up by Dr. Sampson Goon for infectious disease coupled with wound care as best we can and try to see if we keep this clean as much as possible and keep it from becoming more infected. At some point there may be a time where this could continually get worse and may end up not doing nearly as well but right now he does not seem to be doing too poorly and I think this is the best way to support him. I am not overly confident that this is ever getting heal however. 09/15/21 upon evaluation today patient appears to be doing well with regard to his wound. He has been tolerating the dressing changes without complication. Fortunately I do not see any signs of active infection at this time. Overall I think that he is doing quite well. 09-22-2021 upon evaluation today patient appears to be doing about the same in regard to his ankle. Again this is something that I  am not really certain is going to heal is not extremely well he does have chronic osteomyelitis. He again has been told that there is probably not a likely scenario in which this is able to heal. Nonetheless he is not a  HBO candidate due to low ejection fraction. He does see Dr. Sampson GoonFitzgerald he still taking antibiotics at this point we will try to keep the area open so does not develop an abscess. 09-29-2021 upon evaluation today patient appears to be doing well with regard to his wound all things considered. He continues to use the [employer] rope. This is still a fairly deep wound that I think the rope is doing well to help keep the drainage from collecting in the base of the wound there does appear to be less drainage definitely not as purulent as what we have noticed in the past. There is no increased pain and no signs of worsening in general. 10-06-2021 upon evaluation today patient's wound is continuing to show signs of issues here at this point. Fortunately I do not see any evidence of active infection locally or systemically which is great news but at the same time he is definitely having some ongoing problems here currently. 5/8; 2-week follow-up. Right medial ankle. Small wound purulent drainage. He is on doxycycline chronically for underlying osteomyelitis. He has Jeremy [name], Jeremy [name] ([id number]) hardware in this area. He is using Hydrofera Blue packing strips 11-02-2021 upon evaluation today patient appears to be doing much better in regard to his wound I am very pleased to see this. I do not see any evidence of active infection locally nor systemically which is great news. No fevers, chills, nausea, vomiting, or diarrhea. 11-16-2021 upon evaluation today patient appears to be doing well with regard to his ankle ulcer this is a very tiny area remaining at this point. Fortunately I do not see any evidence of infection locally or systemically at this time. Overall I think we are on the right  track. 11-30-2021 Upon inspection patient's wound bed actually showed signs of having just a very small opening that still between 1 to 2 mm max. The back end of the sterile Q-tip stick I was able to get down in here and it does still probe down to bone. With that being said he still has drainage as well. Nonetheless he has not wanted any further surgical intervention and so mainly is just more of something were doing to try to manage this as best we can do only thing really were doing a split Hydrofera Blue externally has a lot of new skin covering over where previously had a lot of open area but at the same time I am still concerned about the fact this probes all the way down to bone. 12-14-2021 upon evaluation today patient appears to be doing about the same in regard to his wound. This still actually probes down to bone. He has not seen ID anytime recently. Has been off of the doxycycline for quite some time. For that reason I really feel like we may need to see about getting something done a little bit more specific for him here and when to try to obtain a culture today so that we can see what that shows and maybe get him on antibiotics that could help this to heal faster. He is in agreement with that plan. 12-28-2021 upon evaluation today patient appears to be doing worse in regard to his wound. Unfortunately this has sealed up and does appear to be trapping quite a bit of fluid underneath the skin the skin is loose and honestly needs to be trimmed away I discussed that with the patient today. He voiced understanding and did consent to the debridement. This is good to be done with  scissors and forceps. 01-11-2022 upon evaluation today patient's wound actually is showing signs of being a little better than last time I saw him. I do believe that the doxycycline has been effective in calming this down to some degree. Fortunately I do not see any evidence of active infection at this time systemically  although locally he is actually having some signs of cellulitis though this is much better compared to 2 weeks ago when I last saw him. 01-25-2022 upon evaluation today patient appears to be doing well with regard to his foot ulcer all things considered. He still has a wound that goes deeper I think switching back to the [facility] rope is probably can to be better for him at this point. Objective Constitutional Well-nourished and well-hydrated in no acute distress. Vitals Time Taken: 9:53 AM, Height: 71 in, Weight: 250 lbs, BMI: 34.9, Temperature: 97.7 F, Pulse: 85 bpm, Respiratory Rate: 18 breaths/min, Blood Pressure: 150/92 mmHg. Respiratory normal breathing without difficulty. Psychiatric this patient is able to make decisions and demonstrates good insight into disease process. Alert and Oriented x 3. pleasant and cooperative. General Notes: Upon inspection patient's wound bed actually showed signs of good granulation and epithelization at this point. Fortunately I do not see any evidence of infection locally or systemically which is great news and overall I am extremely pleased with where we stand. Integumentary (Hair, Skin) Wound #3 status is Open. Original cause of wound was Gradually Appeared. The date acquired was: 08/24/2021. The wound has been in treatment 20 weeks. The wound is located on the Right,Lateral Ankle. The wound measures 0.4cm length x 0.7cm width x 0.9cm depth; 0.22cm^2 area and 0.198cm^3 volume. There is bone and Fat Layer (Subcutaneous Tissue) exposed. There is no tunneling or undermining noted. There is a medium amount of purulent drainage noted. There is medium (34-66%) pink granulation within the wound bed. There is a small (1-33%) amount of necrotic tissue within the wound bed including Adherent Slough. 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 I would recommend currently that we actually continue with the [facility] Blue rope and we will see how this does over the next week. I do believe this Hydrofera Blue rope is easier for him to get down into the wound site is landed over top. 2. Also can recommend patient continue to monitor for any signs of worsening or infection. Obviously if he has any concerns he should contact the office and let me know as soon as possible otherwise my hope is he will continue to make good progress here. We will see patient back for reevaluation in 1 week here in the clinic. If anything worsens or changes patient will contact our office for additional recommendations. Electronic Signature(s) Signed: 01/25/2022 10:27:01 AM By: Lenda Kelp PA-C Entered By: Lenda Kelp on 01/25/2022 10:27:01 Jeremy [name] ([id number]) -------------------------------------------------------------------------------- SuperBill Details Patient Name: Jeremy [name] Date of Service: 01/25/2022 Medical Record Number: [id number] Patient Account Number: [id number] Date of Birth/Sex: [birth year] (65 y.o. M) Treating RN: Huel Coventry Primary Care Provider: Barbette Reichmann Other Clinician: [name] Referring Provider: Barbette Reichmann Treating Provider/Extender: Rowan Blase in Treatment: 20 Diagnosis Coding ICD-10 Codes Code Description [phone] Chronic osteomyelitis with draining sinus, right ankle and foot L97.316  Non-pressure chronic ulcer of right ankle with bone involvement without evidence of necrosis E11.622 Type 2 diabetes mellitus with other skin ulcer I10 Essential (primary) hypertension I25.10 Atherosclerotic heart disease of native coronary artery without angina  pectoris Facility Procedures CPT4 Code: 16109604 Description: 99213 - WOUND CARE VISIT-LEV 3 EST PT Modifier: Quantity: 1 Physician Procedures CPT4 Code Description: 5409811 99214 - WC PHYS LEVEL 4 - EST PT Modifier: Quantity: 1 CPT4 Code Description: ICD-10 Diagnosis Description M86.471 Chronic osteomyelitis with draining sinus, right ankle and foot L97.316 Non-pressure chronic ulcer of right ankle with bone involvement without E11.622 Type 2 diabetes mellitus with other skin  ulcer I10 Essential (primary) hypertension Modifier: evidence of necrosi Quantity: s Electronic Signature(s) Signed: 01/25/2022 4:45:05 PM By: Lenda Kelp PA-C Signed: 01/27/2022 1:47:09 PM By: [name] Previous Signature: 01/25/2022 10:27:15 AM Version By: Lenda Kelp PA-C Entered By: [name] on 01/25/2022 10:30:43

## 2022-01-28 NOTE — Progress Notes (Signed)
DIALLO, PAWLUS ([id number]) Visit Report for 01/25/2022 Arrival Information Details Patient Name: Jeremy [name] Date of Service: 01/25/2022 9:45 AM Medical Record Number: [id number] Patient Account Number: [id number] Date of Birth/Sex: [birth year] (65 y.o. M) Treating RN: Jeremy [name] Primary Care Jeremy [name]: Jeremy [name] Other Clinician: Massie [name] Referring Maximino Cozzolino: Jeremy [name] Treating Gulianna Hornsby/Extender: Jeremy [name] in Treatment: 20 Visit Information History Since Last Visit All ordered tests and consults were completed: No Patient Arrived: Ambulatory Added or deleted any medications: No Arrival Time: 09:48 Any new allergies or adverse reactions: No Transfer Assistance: None Had a fall or experienced change in No Patient Requires Transmission-Based No activities of daily living that may affect Precautions: risk of falls: Patient Has Alerts: Yes Hospitalized since last visit: No Patient Alerts: Patient on Blood Pain Present Now: No Thinner Eliquis Electronic Signature(s) Signed: 01/27/2022 1:47:09 PM By: Jeremy [name] Entered By: Jeremy [name] on 01/25/2022 09:53:32 Jeremy [name] ([id number]) -------------------------------------------------------------------------------- Clinic Level of Care Assessment Details Patient Name: Jeremy [name] Date of Service: 01/25/2022 9:45 AM Medical Record Number: [id number] Patient Account Number: [id number] Date of Birth/Sex: 1955/07/13 (65 y.o. M) Treating RN: Jeremy [name] Primary Care Anwitha Mapes: Jeremy [name] Other Clinician: Massie [name] Referring Terrel Manalo: Jeremy [name] Treating Adamariz Gillott/Extender: Jeremy [name] in Treatment: 20 Clinic Level of Care Assessment Items TOOL 4 Quantity Score []  - Use when only an EandM is performed on FOLLOW-UP visit 0 ASSESSMENTS - Nursing Assessment / Reassessment X - Reassessment of Co-morbidities (includes updates in patient status) 1 10 X- 1 5 Reassessment  of Adherence to Treatment Plan ASSESSMENTS - Wound and Skin Assessment / Reassessment X - Simple Wound Assessment / Reassessment - one wound 1 5 []  - 0 Complex Wound Assessment / Reassessment - multiple wounds []  - 0 Dermatologic / Skin Assessment (not related to wound area) ASSESSMENTS - Focused Assessment []  - Circumferential Edema Measurements - multi extremities 0 []  - 0 Nutritional Assessment / Counseling / Intervention []  - 0 Lower Extremity Assessment (monofilament, tuning fork, pulses) []  - 0 Peripheral Arterial Disease Assessment (using hand held doppler) ASSESSMENTS - Ostomy and/or Continence Assessment and Care []  - Incontinence Assessment and Management 0 []  - 0 Ostomy Care Assessment and Management (repouching, etc.) PROCESS - Coordination of Care X - Simple Patient / Family Education for ongoing care 1 15 []  - 0 Complex (extensive) Patient / Family Education for ongoing care []  - 0 Staff obtains Programmer, systems, Records, Test Results / Process Orders []  - 0 Staff telephones HHA, Nursing Homes / Clarify orders / etc []  - 0 Routine Transfer to another Facility (non-emergent condition) []  - 0 Routine Hospital Admission (non-emergent condition) []  - 0 New Admissions / Biomedical engineer / Ordering NPWT, Apligraf, etc. []  - 0 Emergency Hospital Admission (emergent condition) X- 1 10 Simple Discharge Coordination []  - 0 Complex (extensive) Discharge Coordination PROCESS - Special Needs []  - Pediatric / Minor Patient Management 0 []  - 0 Isolation Patient Management []  - 0 Hearing / Language / Visual special needs []  - 0 Assessment of Community assistance (transportation, D/C planning, etc.) []  - 0 Additional assistance / Altered mentation []  - 0 Support Surface(s) Assessment (bed, cushion, seat, etc.) INTERVENTIONS - Wound Cleansing / Measurement [name], [name] ([id number]) X- 1 5 Simple Wound Cleansing - one wound []  - 0 Complex Wound Cleansing -  multiple wounds X- 1 5 Wound Imaging (photographs - any number of wounds) []  - 0 Wound Tracing (instead of photographs) []  - 0 Simple Wound Measurement - one wound X- 1 5 Complex  Wound Measurement - multiple wounds INTERVENTIONS - Wound Dressings []  - Small Wound Dressing one or multiple wounds 0 X- 1 15 Medium Wound Dressing one or multiple wounds []  - 0 Large Wound Dressing one or multiple wounds []  - 0 Application of Medications - topical []  - 0 Application of Medications - injection INTERVENTIONS - Miscellaneous []  - External ear exam 0 []  - 0 Specimen Collection (cultures, biopsies, blood, body fluids, etc.) []  - 0 Specimen(s) / Culture(s) sent or taken to Lab for analysis []  - 0 Patient Transfer (multiple staff / / Similar devices) []  - 0 Simple Staple / Suture removal (25 or less) []  - 0 Complex Staple / Suture removal (26 or more) []  - 0 Hypo / Hyperglycemic Management (close monitor of Blood Glucose) []  - 0 Ankle / Brachial Index (ABI) - do not check if billed separately X- 1 5 Vital Signs Has the patient been seen at the hospital within the last three years: Yes Total Score: 80 Level Of Care: New/Established - Level 3 Electronic Signature(s) Signed: 01/27/2022 1:47:09 PM By: Entered By: on 01/25/2022 10:30:21 ( ) -------------------------------------------------------------------------------- Encounter Discharge Information Details Patient Name: Date of Service: 01/25/2022 9:45 AM Medical Record Number: Patient Account Number: Date of Birth/Sex: March 09, 1956 (65 y.o. M) Treating RN: 01/29/2022 Primary Care Meredith Kilbride: [name] Other Clinician: [name] Referring Eulis Salazar: 01/27/2022 Treating Marcia Hartwell/Extender: Jeremy [name] in Treatment: 20 Encounter Discharge Information Items Discharge Condition: Stable Ambulatory Status:  Ambulatory Discharge Destination: Home Transportation: Private Auto Accompanied By: self Schedule Follow-up Appointment: Yes Clinical Summary of Care: Electronic Signature(s) Signed: 01/27/2022 1:47:09 PM By: Jeremy [name] Entered By: 01/27/2022 on 01/25/2022 10:31:49 [id number] (06/09/1956) -------------------------------------------------------------------------------- Lower Extremity Assessment Details Patient Name: 09-07-1989 Date of Service: 01/25/2022 9:45 AM Medical Record Number: Barbette Reichmann Patient Account Number: [name] Date of Birth/Sex: 07-15-1955 (65 y.o. M) Treating RN: 01/29/2022 Primary Care Czar Ysaguirre: [name] Other Clinician: [name] Referring Zamiah Tollett: 01/27/2022 Treating Jazzalynn Rhudy/Extender: Jeremy [name] in Treatment: 20 Electronic Signature(s) Signed: 01/25/2022 5:27:45 PM By: Jeremy [name] BSN, RN, CWS, Kim RN, BSN Signed: 01/27/2022 1:47:09 PM By: [id number] Entered By: [id number] on 01/25/2022 10:03:41 09-07-1989 (Jeremy [name]) -------------------------------------------------------------------------------- Multi Wound Chart Details Patient Name: Barbette Reichmann Date of Service: 01/25/2022 9:45 AM Medical Record Number: Barbette Reichmann Patient Account Number: Rowan Blase Date of Birth/Sex: 08-08-1955 (65 y.o. M) Treating RN: 01/29/2022 Primary Care Daffney Greenly: [name] Other Clinician: [name] Referring Eun Vermeer: 01/27/2022 Treating Inocencia Murtaugh/Extender: Jeremy [name] in Treatment: 20 Vital Signs Height(in): 71 Pulse(bpm): 85 Weight(lbs): 250 Blood Pressure(mmHg): 150/92 Body Mass Index(BMI): 34.9 Temperature(F): 97.7 Respiratory Rate(breaths/min): 18 Photos: [N/A:N/A] Wound Location: Right, Lateral Ankle N/A N/A Wounding Event: Gradually Appeared N/A N/A Primary Etiology: Refractory Osteomyelitis N/A N/A Secondary Etiology: Diabetic Wound/Ulcer of the Lower N/A  N/A Extremity Comorbid History: Arrhythmia, Congestive Heart N/A N/A Failure, Coronary Artery Disease, Hypertension, Type II Diabetes, Osteoarthritis, Osteomyelitis Date Acquired: 08/24/2021 N/A N/A Weeks of Treatment: 20 N/A N/A Wound Status: Open N/A N/A Wound Recurrence: No N/A N/A Pending Amputation on Yes N/A N/A Presentation: Measurements L x W x D (cm) 0.4x0.7x0.9 N/A N/A Area (cm) : 0.22 N/A N/A Volume (cm) : 0.198 N/A N/A % Reduction in Area: -609.70% N/A N/A % Reduction in Volume: -800.00% N/A N/A Classification: Full Thickness With Exposed N/A N/A Support Structures Exudate Amount: Medium N/A N/A Exudate Type: Purulent N/A N/A Exudate Color: yellow, brown, green N/A N/A  Granulation Amount: Medium (34-66%) N/A N/A Granulation Quality: Pink N/A N/A Necrotic Amount: Small (1-33%) N/A N/A Exposed Structures: Fat Layer (Subcutaneous Tissue): N/A N/A Yes Bone: Yes Fascia: No Tendon: No Muscle: No Joint: No Epithelialization: Small (1-33%) N/A N/A Treatment Notes Electronic Jeremy [name], Jeremy [name] ([id number]) Signed: 01/27/2022 1:47:09 PM By: [name] Entered By: [name] on 01/25/2022 10:03:52 Jeremy [name] ([id number]) -------------------------------------------------------------------------------- Multi-Disciplinary Care Plan Details Patient Name: Jeremy [name] Date of Service: 01/25/2022 9:45 AM Medical Record Number: [id number] Patient Account Number: [id number] Date of Birth/Sex: 30-Oct-1955 (65 y.o. M) Treating RN: Jeremy [name] Primary Care Onyinyechi Huante: Barbette Reichmann Other Clinician: [name] Referring Neila Teem: Barbette Reichmann Treating Albie Bazin/Extender: Rowan Blase in Treatment: 20 Active Inactive Osteomyelitis Nursing Diagnoses: Knowledge deficit related to disease process and management Potential for infection: osteomyelitis Goals: Patient/caregiver will verbalize understanding of disease process and disease  management Date Initiated: 09/07/2021 Target Resolution Date: 09/18/2021 Goal Status: Active Interventions: Assess for signs and symptoms of osteomyelitis resolution every visit Provide education on osteomyelitis Notes: Wound/Skin Impairment Nursing Diagnoses: Impaired tissue integrity Knowledge deficit related to smoking impact on wound healing Knowledge deficit related to ulceration/compromised skin integrity Goals: Patient will demonstrate a reduced rate of smoking or cessation of smoking Date Initiated: 09/07/2021 Target Resolution Date: 10/16/2021 Goal Status: Active Patient/caregiver will verbalize understanding of skin care regimen Date Initiated: 09/07/2021 Target Resolution Date: 09/18/2021 Goal Status: Active Ulcer/skin breakdown will have a volume reduction of 30% by week 4 Date Initiated: 09/07/2021 Target Resolution Date: 10/05/2021 Goal Status: Active Ulcer/skin breakdown will have a volume reduction of 50% by week 8 Date Initiated: 09/07/2021 Target Resolution Date: 11/02/2021 Goal Status: Active Ulcer/skin breakdown will have a volume reduction of 80% by week 12 Date Initiated: 09/07/2021 Target Resolution Date: 11/30/2021 Goal Status: Active Ulcer/skin breakdown will heal within 14 weeks Date Initiated: 09/07/2021 Target Resolution Date: 12/14/2021 Goal Status: Active Interventions: Assess patient/caregiver ability to obtain necessary supplies Assess patient/caregiver ability to perform ulcer/skin care regimen upon admission and as needed Assess ulceration(s) every visit Notes: Electronic Signature(s) Jeremy [name], Jeremy [name] ([id number]) Signed: 01/25/2022 5:27:45 PM By: Elliot Gurney, BSN, RN, CWS, Kim RN, BSN Signed: 01/27/2022 1:47:09 PM By: [name] Entered By: [name] on 01/25/2022 10:03:46 Jeremy [name] ([id number]) -------------------------------------------------------------------------------- Pain Assessment Details Patient Name: Jeremy [name] Date of  Service: 01/25/2022 9:45 AM Medical Record Number: [id number] Patient Account Number: [id number] Date of Birth/Sex: [birth year] (65 y.o. M) Treating RN: Jeremy [name] Primary Care Francies Inch: Barbette Reichmann Other Clinician: [name] Referring Rashae Rother: Barbette Reichmann Treating Issak Goley/Extender: Rowan Blase in Treatment: 20 Active Problems Location of Pain Severity and Description of Pain Patient Has Paino No Site Locations Pain Management and Medication Current Pain Management: Electronic Signature(s) Signed: 01/25/2022 5:27:45 PM By: Elliot Gurney, BSN, RN, CWS, Kim RN, BSN Signed: 01/27/2022 1:47:09 PM By: [name] Entered By: [name] on 01/25/2022 09:55:43 Jeremy [name] ([id number]) -------------------------------------------------------------------------------- Patient/Caregiver Education Details Patient Name: Jeremy [name] Date of Service: 01/25/2022 9:45 AM Medical Record Number: [id number] Patient Account Number: [id number] Date of Birth/Gender: June 20, 1955 (65 y.o. M) Treating RN: Jeremy [name] Primary Care Physician: Barbette Reichmann Other Clinician: [name] Referring Physician: Barbette Reichmann Treating Physician/Extender: Rowan Blase in Treatment: 20 Education Assessment Education Provided To: Patient Education Topics Provided Wound/Skin Impairment: Handouts: Other: continue wound care as directed Methods: Explain/Verbal Responses: State content correctly Electronic Signature(s) Signed: 01/27/2022 1:47:09 PM By: [name] Entered By: [name] on 01/25/2022 10:31:16 Jeremy [name] ([id number]) -------------------------------------------------------------------------------- Wound Assessment Details Patient Name: Jeremy [name] Date of Service: 01/25/2022  9:45 AM Medical Record Number: [id number] Patient Account Number: [id number] Date of Birth/Sex: [birth year] (65 y.o. M) Treating RN: Jeremy [name] Primary Care Joei Frangos:  Jeremy [name] Other Clinician: Massie [name] Referring Charlott Calvario: Jeremy [name] Treating Shandrell Boda/Extender: Jeremy [name] in Treatment: 20 Wound Status Wound Number: 3 Primary Refractory Osteomyelitis Etiology: Wound Location: Right, Lateral Ankle Secondary Diabetic Wound/Ulcer of the Lower Extremity Wounding Event: Gradually Appeared Etiology: Date Acquired: 08/24/2021 Wound Open Weeks Of Treatment: 20 Status: Clustered Wound: No Comorbid Arrhythmia, Congestive Heart Failure, Coronary Artery Pending Amputation On Presentation History: Disease, Hypertension, Type II Diabetes, Osteoarthritis, Osteomyelitis Photos Wound Measurements Length: (cm) 0.4 Width: (cm) 0.7 Depth: (cm) 0.9 Area: (cm) 0.22 Volume: (cm) 0.198 % Reduction in Area: -609.7% % Reduction in Volume: -800% Epithelialization: Small (1-33%) Tunneling: No Undermining: No Wound Description Classification: Full Thickness With Exposed Support Structures Exudate Amount: Medium Exudate Type: Purulent Exudate Color: yellow, brown, green Foul Odor After Cleansing: No Slough/Fibrino Yes Wound Bed Granulation Amount: Medium (34-66%) Exposed Structure Granulation Quality: Pink Fascia Exposed: No Necrotic Amount: Small (1-33%) Fat Layer (Subcutaneous Tissue) Exposed: Yes Necrotic Quality: Adherent Slough Tendon Exposed: No Muscle Exposed: No Joint Exposed: No Bone Exposed: Yes Treatment Notes Wound #3 (Ankle) Wound Laterality: Right, Lateral Cleanser Normal Saline Discharge Instruction: Wash your hands with soap and water. Remove old dressing, discard into plastic bag and place into trash. Cleanse the wound with Normal Saline prior to applying a clean dressing using gauze sponges, not tissues or cotton balls. Do not [name], Jeremy ([id number]) scrub or use excessive force. Pat dry using gauze sponges, not tissue or cotton balls. Peri-Wound Care Topical Primary Dressing Hydrofera Blue Classic  Foam Rope Dressing, 9x6 (mm/in) Secondary Dressing Telfa Adhesive Island Dressing, 4x4 (in/in) Discharge Instruction: Apply over dressing to secure in place. Secured With Compression Wrap Compression Stockings Environmental education officer) Signed: 01/25/2022 5:27:45 PM By: Gretta Cool, BSN, RN, CWS, Kim RN, BSN Signed: 01/27/2022 1:47:09 PM By: Jeremy [name] Entered By: Jeremy [name] on 01/25/2022 10:03:06 Jeremy [name] ([id number]) -------------------------------------------------------------------------------- Vitals Details Patient Name: Jeremy [name] Date of Service: 01/25/2022 9:45 AM Medical Record Number: [id number] Patient Account Number: [id number] Date of Birth/Sex: [birth year] (65 y.o. M) Treating RN: Jeremy [name] Primary Care Jayse Hodkinson: Jeremy [name] Other Clinician: Massie [name] Referring Brax Walen: Jeremy [name] Treating Delonta Yohannes/Extender: Jeremy [name] in Treatment: 20 Vital Signs Time Taken: 09:53 Temperature (F): 97.7 Height (in): 71 Pulse (bpm): 85 Weight (lbs): 250 Respiratory Rate (breaths/min): 18 Body Mass Index (BMI): 34.9 Blood Pressure (mmHg): 150/92 Reference Range: 80 - 120 mg / dl Electronic Signature(s) Signed: 01/27/2022 1:47:09 PM By: Jeremy [name] Entered By: Jeremy [name] on 01/25/2022 09:55:40

## 2022-02-08 ENCOUNTER — Encounter: Payer: Medicaid Other | Admitting: Physician Assistant

## 2022-02-08 DIAGNOSIS — M86471 Chronic osteomyelitis with draining sinus, right ankle and foot: Secondary | ICD-10-CM | POA: Diagnosis not present

## 2022-02-08 NOTE — Progress Notes (Signed)
SCORPIO, FORTIN ([id number]) Visit Report for 02/08/2022 Arrival Information Details Patient Name: Jeremy [name], Jeremy [name] Date of Service: 02/08/2022 9:45 AM Medical Record Number: [id number] Patient Account Number: [id number] Date of Birth/Sex: April 12, 1956 (66 y.o. M) Treating RN: Huel Coventry Primary Care Thressa Shiffer: Barbette Reichmann Other Clinician: [name] Referring Archibald Marchetta: Barbette Reichmann Treating Darrielle Pflieger/Extender: Rowan Blase in Treatment: 22 Visit Information History Since Last Visit All ordered tests and consults were completed: No Patient Arrived: Ambulatory Added or deleted any medications: No Arrival Time: 09:52 Any new allergies or adverse reactions: No Transfer Assistance: None Had a fall or experienced change in No Patient Requires Transmission-Based No activities of daily living that may affect Precautions: risk of falls: Patient Has Alerts: Yes Hospitalized since last visit: No Patient Alerts: Patient on Blood Pain Present Now: No Thinner Eliquis Electronic Signature(s) Unsigned Entered ByBetha Loa on 02/08/2022 09:55:12 Signature(s): Date(s): DEZ, STAUFFER ([id number]) -------------------------------------------------------------------------------- Lower Extremity Assessment Details Patient Name: Jeremy [name] Date of Service: 02/08/2022 9:45 AM Medical Record Number: [id number] Patient Account Number: [id number] Date of Birth/Sex: [birth year] (65 y.o. M) Treating RN: Huel Coventry Primary Care Elwanda Moger: Barbette Reichmann Other Clinician: [name] Referring Sarp Vernier: Barbette Reichmann Treating Torre Schaumburg/Extender: Rowan Blase in Treatment: 22 Electronic Signature(s) Unsigned Entered By: [name] on 02/08/2022 10:08:54 Signature(s): Date(s): JETHRO, RADKE ([id number]) -------------------------------------------------------------------------------- Multi Wound Chart Details Patient Name: Jeremy [name] Date of  Service: 02/08/2022 9:45 AM Medical Record Number: [id number] Patient Account Number: [id number] Date of Birth/Sex: 02-Sep-1955 (65 y.o. M) Treating RN: Huel Coventry Primary Care Krisha Beegle: Barbette Reichmann Other Clinician: [name] Referring Juwana Thoreson: Barbette Reichmann Treating Donyel Nester/Extender: Rowan Blase in Treatment: 22 Vital Signs Height(in): 71 Pulse(bpm): 120 Weight(lbs): 250 Blood Pressure(mmHg): 147/80 Body Mass Index(BMI): 34.9 Temperature(F): 97.6 Respiratory Rate(breaths/min): 18 Photos: [N/A:N/A] Wound Location: Right, Lateral Ankle N/A N/A Wounding Event: Gradually Appeared N/A N/A Primary Etiology: Refractory Osteomyelitis N/A N/A Secondary Etiology: Diabetic Wound/Ulcer of the Lower N/A N/A Extremity Comorbid History: Arrhythmia, Congestive Heart N/A N/A Failure, Coronary Artery Disease, Hypertension, Type II Diabetes, Osteoarthritis, Osteomyelitis Date Acquired: 08/24/2021 N/A N/A Weeks of Treatment: 22 N/A N/A Wound Status: Open N/A N/A Wound Recurrence: No N/A N/A Pending Amputation on Yes N/A N/A Presentation: Measurements L x W x D (cm) 0.4x0.4x1.1 N/A N/A Area (cm) : 0.126 N/A N/A Volume (cm) : 0.138 N/A N/A % Reduction in Area: -306.50% N/A N/A % Reduction in Volume: -527.30% N/A N/A Classification: Full Thickness With Exposed N/A N/A Support Structures Exudate Amount: Medium N/A N/A Exudate Type: Purulent N/A N/A Exudate Color: yellow, brown, green N/A N/A Granulation Amount: Medium (34-66%) N/A N/A Granulation Quality: Pink N/A N/A Necrotic Amount: Small (1-33%) N/A N/A Exposed Structures: Fat Layer (Subcutaneous Tissue): N/A N/A Yes Bone: Yes Fascia: No Tendon: No Muscle: No Joint: No Epithelialization: Small (1-33%) N/A N/A Treatment Notes Electronic Signature(s) ARVON, SCHREINER ([id number]) Unsigned Entered By: [name] on 02/08/2022 10:09:04 Signature(s): Date(s): [name]  ([id number]) -------------------------------------------------------------------------------- Multi-Disciplinary Care Plan Details Patient Name: Jeremy [name] Date of Service: 02/08/2022 9:45 AM Medical Record Number: [id number] Patient Account Number: [id number] Date of Birth/Sex: 09/03/1955 (66 y.o. M) Treating RN: Huel Coventry Primary Care Louden Houseworth: Barbette Reichmann Other Clinician: [name] Referring Melodi Happel: Barbette Reichmann Treating Emmaly Leech/Extender: Rowan Blase in Treatment: 22 Active Inactive Osteomyelitis Nursing Diagnoses: Knowledge deficit related to disease process and management Potential for infection: osteomyelitis Goals: Patient/caregiver will verbalize understanding of disease process and disease management Date Initiated: 09/07/2021 Target Resolution Date: 09/18/2021 Goal Status: Active Interventions: Assess for signs and symptoms of  osteomyelitis resolution every visit Provide education on osteomyelitis Notes: Wound/Skin Impairment Nursing Diagnoses: Impaired tissue integrity Knowledge deficit related to smoking impact on wound healing Knowledge deficit related to ulceration/compromised skin integrity Goals: Patient will demonstrate a reduced rate of smoking or cessation of smoking Date Initiated: 09/07/2021 Target Resolution Date: 10/16/2021 Goal Status: Active Patient/caregiver will verbalize understanding of skin care regimen Date Initiated: 09/07/2021 Target Resolution Date: 09/18/2021 Goal Status: Active Ulcer/skin breakdown will have a volume reduction of 30% by week 4 Date Initiated: 09/07/2021 Target Resolution Date: 10/05/2021 Goal Status: Active Ulcer/skin breakdown will have a volume reduction of 50% by week 8 Date Initiated: 09/07/2021 Target Resolution Date: 11/02/2021 Goal Status: Active Ulcer/skin breakdown will have a volume reduction of 80% by week 12 Date Initiated: 09/07/2021 Target Resolution Date: 11/30/2021 Goal Status:  Active Ulcer/skin breakdown will heal within 14 weeks Date Initiated: 09/07/2021 Target Resolution Date: 12/14/2021 Goal Status: Active Interventions: Assess patient/caregiver ability to obtain necessary supplies Assess patient/caregiver ability to perform ulcer/skin care regimen upon admission and as needed Assess ulceration(s) every visit Notes: Electronic Signature(s) FABRICIO, ENDSLEY ([id number]) Unsigned Entered By: [name] on 02/08/2022 10:08:58 Signature(s): Date(s): [name] ([id number]) -------------------------------------------------------------------------------- Pain Assessment Details Patient Name: BAYARD, MORE Date of Service: 02/08/2022 9:45 AM Medical Record Number: [id number] Patient Account Number: [id number] Date of Birth/Sex: 1955-12-14 (65 y.o. M) Treating RN: Huel Coventry Primary Care Milania Haubner: Barbette Reichmann Other Clinician: [name] Referring Saurabh Hettich: Barbette Reichmann Treating Ammy Lienhard/Extender: Rowan Blase in Treatment: 22 Active Problems Location of Pain Severity and Description of Pain Patient Has Paino No Site Locations Pain Management and Medication Current Pain Management: Electronic Signature(s) Unsigned Entered By: [name] on 02/08/2022 10:01:03 Signature(s): Date(s): JOCK, MAHON ([id number]) -------------------------------------------------------------------------------- Wound Assessment Details Patient Name: BABAK, LUCUS Date of Service: 02/08/2022 9:45 AM Medical Record Number: [id number] Patient Account Number: [id number] Date of Birth/Sex: [birth year] (65 y.o. M) Treating RN: Huel Coventry Primary Care Marleigh Kaylor: Barbette Reichmann Other Clinician: [name] Referring Demetrias Goodbar: Barbette Reichmann Treating Kelcie Currie/Extender: Rowan Blase in Treatment: 22 Wound Status Wound Number: 3 Primary Refractory Osteomyelitis Etiology: Wound Location: Right, Lateral Ankle Secondary Diabetic  Wound/Ulcer of the Lower Extremity Wounding Event: Gradually Appeared Etiology: Date Acquired: 08/24/2021 Wound Open Weeks Of Treatment: 22 Status: Clustered Wound: No Comorbid Arrhythmia, Congestive Heart Failure, Coronary Artery Pending Amputation On Presentation History: Disease, Hypertension, Type II Diabetes, Osteoarthritis, Osteomyelitis Photos Wound Measurements Length: (cm) 0.4 Width: (cm) 0.4 Depth: (cm) 1.1 Area: (cm) 0.126 Volume: (cm) 0.138 % Reduction in Area: -306.5% % Reduction in Volume: -527.3% Epithelialization: Small (1-33%) Wound Description Classification: Full Thickness With Exposed Support Structures Exudate Amount: Medium Exudate Type: Purulent Exudate Color: yellow, brown, green Foul Odor After Cleansing: No Slough/Fibrino Yes Wound Bed Granulation Amount: Medium (34-66%) Exposed Structure Granulation Quality: Pink Fascia Exposed: No Necrotic Amount: Small (1-33%) Fat Layer (Subcutaneous Tissue) Exposed: Yes Necrotic Quality: Adherent Slough Tendon Exposed: No Muscle Exposed: No Joint Exposed: No Bone Exposed: Yes Electronic Signature(s) Unsigned Entered ByBetha Loa on 02/08/2022 10:08:35 Signature(s): Date(s): [name] ([id number]) -------------------------------------------------------------------------------- Vitals Details Patient Name: [name] Date of Service: 02/08/2022 9:45 AM Medical Record Number: [id number] Patient Account Number: [id number] Date of Birth/Sex: 1955-06-30 (65 y.o. M) Treating RN: Huel Coventry Primary Care Kimon Loewen: Barbette Reichmann Other Clinician: [name] Referring Kamden Stanislaw: Barbette Reichmann Treating Joie Reamer/Extender: Rowan Blase in Treatment: 22 Vital Signs Time Taken: 09:55 Temperature (F): 97.6 Height (in): 71 Pulse (bpm): 120 Weight (lbs): 250 Respiratory Rate (breaths/min): 18 Body Mass Index (BMI): 34.9 Blood Pressure (  mmHg): 147/80 Reference Range: 80 -  120 mg / dl Electronic Signature(s) Unsigned Entered By: [name] on 02/08/2022 09:58:15 Signature(s): Date(s):

## 2022-02-08 NOTE — Progress Notes (Addendum)
GURLEY, CLIMER ([id number]) Visit Report for 02/08/2022 Chief Complaint Document Details Patient Name: Jeremy [name] Date of Service: 02/08/2022 9:45 AM Medical Record Number: [id number] Patient Account Number: [id number] Date of Birth/Sex: Sep 13, 1955 (66 y.o. M) Treating RN: Huel Coventry Primary Care Provider: Barbette Reichmann Other Clinician: [name] Referring Provider: Barbette Reichmann Treating Provider/Extender: Rowan Blase in Treatment: 22 Information Obtained from: Patient Chief Complaint Right ankle ulcer Electronic Signature(s) Signed: 02/08/2022 9:50:27 AM By: Lenda Kelp PA-C Entered By: Lenda Kelp on 02/08/2022 09:50:27 Jeremy [name] ([id number]) -------------------------------------------------------------------------------- HPI Details Patient Name: Jeremy [name] Date of Service: 02/08/2022 9:45 AM Medical Record Number: [id number] Patient Account Number: [id number] Date of Birth/Sex: 14-Jan-1956 (65 y.o. M) Treating RN: Huel Coventry Primary Care Provider: Barbette Reichmann Other Clinician: [name] Referring Provider: Barbette Reichmann Treating Provider/Extender: Rowan Blase in Treatment: 22 History of Present Illness HPI Description: 08/18/2020 upon evaluation today patient presents for initial inspection here in our clinic concerning issues has been having with his right lateral ankle and this has been present for at least a year he tells me. He is a patient of Dr. Orland Jarred. Subsequently Dr. Orland Jarred has since retired hence the patient look this up and is coming here for wound care to see if we can help him out. He did have a screw pushed out of this location and since that time tells me that the screw was removed but nonetheless the hole has remained. Fortunately there does not appear to be any signs of active infection systemically at this point though it does raise the question as to whether or not this might be a  bone/hardware infection being that this has been open for so long. The patient does have a history of diabetes mellitus type 2, coronary artery disease, hypertension, and unfortunately does not seem to be doing as great as I would like to see him regard to his left ankle. 08/25/2020 upon evaluation today patient appears to be doing well with regard to his ankle ulcer compared to last week this is definitely smaller. With that being said it still does probe down to bone/hardware. I still think this can be appropriate for him to see the orthopedic specialist. He has not heard from them yet he tells me. 09/05/2020 upon evaluation today patient appears to be doing extremely well in regard to his wound all things considered. I do not see that this is gotten any worse. Unfortunately also do not think he is gotten any better. We did make a referral to orthopedics to Dr. Victorino Dike specifically but the patient tells me that he has not heard from them. That is unusual as they normally get in touch with patients fairly quickly. I will ask him if he possibly missed a phone call he tells me he will check when he gets home. Nonetheless he does not want to go to [facility] anyway which he did not tell me previously therefore we will get a see about making a referral to Dr. Logan Bores to see what he potentially could recommend for the patient as well I think Dr. Logan Bores is excellent and a good option here. Readmission: 10/28/2020 upon evaluation today patient appears to be doing about the same as when I last saw him. He did not come back for follow-up last time I saw him was 09/05/2020. Basically he tells me that he has been attempting to manage this on his own using the Hydrofera Blue rope. He did get a call from [facility] but they  did not actually get him scheduled due to the fact that the patient told me that he told them he did not wanted to see them he just wanted to come back and see me because he was happy with my  care. Nonetheless as I explained to the patient he has a fractured screw at the site of the wound he also has another screw where there appears to be some loosening of the screw and potential for hardware/bone infection here. Subsequently I think he really does need to see a specialist to see what they can do to help him out I do not believe him to be able to get this healed without taking care of the hardware issues. 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 He has been advised previous of a need for an above-knee amputation due to some of the aggressive breakdown in the ankle region. Nonetheless he has had the removal of the screw performed in office with Dr. Luana Shu and this was on 08/28/2021. Subsequently the patient is a poor historian he also gets upset very easily. Of note I do believe that he is going to require some dressings to be packed into the area due to the fact that to be honest this is still tracking all the way straight down to bone where there was pus and purulence coming out once I did get down to that area. Subsequently he tells me that he is having pain although it does seem to be doing better since the screw was removed. 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 Therefore his best options probably do entail the continue following up by Dr. Ola Spurr for infectious disease coupled with wound care as best we can and try to see if we keep this clean as much as possible and keep it from becoming more infected. At some point there may be a time where this could continually get worse and may end up not doing nearly as well but right now he does not seem to be doing too poorly and I think this is the best way to support him. I am not overly  confident that this is ever getting heal however. 09/15/21 upon evaluation today patient appears to be doing well with regard to his wound. He has been tolerating the dressing changes without complication. Fortunately I do not see any signs of active infection at this time. Overall I think that he is doing quite well. 09-22-2021 upon evaluation today patient appears to be doing about the same in regard to his ankle. 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 He again has been told that there is probably not a likely scenario in which this is able to heal. Nonetheless he is not a HBO candidate due to low ejection fraction. He does see Dr. Ola Spurr he still taking antibiotics at this point we will try to keep the area open so does not develop an abscess. 09-29-2021 upon evaluation today patient appears to be doing well with regard to his wound all things considered. He continues to use the [employer] rope. This is still a fairly deep wound that I think the rope is doing well to help  keep the drainage from collecting in the base of the wound there does appear to be less drainage definitely not as purulent as what we have noticed in the past. There is no increased pain and no signs of worsening in general. 10-06-2021 upon evaluation today patient's wound is continuing to show signs of issues here at this point. Fortunately I do not see any evidence of active infection locally or systemically which is great news but at the same time he is definitely having some ongoing problems here currently. 5/8; 2-week follow-up. Right medial ankle. Small wound purulent drainage. He is on doxycycline chronically for underlying osteomyelitis. He has hardware in this area. He is using Hydrofera Blue packing strips 11-02-2021 upon evaluation today patient appears to be doing much better in regard to his wound I am very pleased to see this.  I do not see any evidence of active infection locally nor systemically which is great news. No fevers, chills, nausea, vomiting, or diarrhea. BREVON, DEWALD ([id number]) 11-16-2021 upon evaluation today patient appears to be doing well with regard to his ankle ulcer this is a very tiny area remaining at this point. Fortunately I do not see any evidence of infection locally or systemically at this time. Overall I think we are on the right track. 11-30-2021 Upon inspection patient's wound bed actually showed signs of having just a very small opening that still between 1 to 2 mm max. The back end of the sterile Q-tip stick I was able to get down in here and it does still probe down to bone. With that being said he still has drainage as well. Nonetheless he has not wanted any further surgical intervention and so mainly is just more of something were doing to try to manage this as best we can do only thing really were doing a split Hydrofera Blue externally has a lot of new skin covering over where previously had a lot of open area but at the same time I am still concerned about the fact this probes all the way down to bone. 12-14-2021 upon evaluation today patient appears to be doing about the same in regard to his wound. This still actually probes down to bone. He has not seen ID anytime recently. Has been off of the doxycycline for quite some time. For that reason I really feel like we may need to see about getting something done a little bit more specific for him here and when to try to obtain a culture today so that we can see what that shows and maybe get him on antibiotics that could help this to heal faster. He is in agreement with that plan. 12-28-2021 upon evaluation today patient appears to be doing worse in regard to his wound. Unfortunately this has sealed up and does appear to be trapping quite a bit of fluid underneath the skin the skin is loose and honestly needs to be trimmed away I discussed  that with the patient today. He voiced understanding and did consent to the debridement. This is good to be done with scissors and forceps. 01-11-2022 upon evaluation today patient's wound actually is showing signs of being a little better than last time I saw him. I do believe that the doxycycline has been effective in calming this down to some degree. Fortunately I do not see any evidence of active infection at this time systemically although locally he is actually having some signs of cellulitis though this is much better compared to 2 weeks ago when  I last saw him. 01-25-2022 upon evaluation today patient appears to be doing well with regard to his foot ulcer all things considered. He still has a wound that goes deeper I think switching back to the [facility] rope is probably can to be better for him at this point. 02-08-2022 upon evaluation today patient appears to be doing well currently in regard to his wound all things considered. This is still chronic osteomyelitis issue at least from the previous evaluations. He has previously been on antibiotics for an extended period of time. Right now I have a Mannam which I started back on July 17 and that was for 2 refills he is still taking that. There was an initial 30-day supply. That should actually carry him from July 17 to October 17. With that being said I am really not sure that we are seeing a whole lot of improvement we seem to have some waxing and waning but would not ever really completely seeing this healed unfortunately. I do think the Hydrofera Blue rope is the best way to go however this still is going all the way down to bone and that has me concerned. I discussed this with the patient before but he really does not want to take any further surgical intervention he mainly is just wanting to try let this heal its own way and he is happy with the way things are going at the moment. Electronic Signature(s) Signed: 02/08/2022 10:24:17 AM  By: Lenda Kelp PA-C Entered By: Lenda Kelp on 02/08/2022 10:24:17 CAINE, BARFIELD ([id number]) -------------------------------------------------------------------------------- Physical Exam Details Patient Name: Jeremy [name] Date of Service: 02/08/2022 9:45 AM Medical Record Number: [id number] Patient Account Number: [id number] Date of Birth/Sex: 1955-12-23 (65 y.o. M) Treating RN: Huel Coventry Primary Care Provider: Barbette Reichmann Other Clinician: [name] Referring Provider: Barbette Reichmann Treating Provider/Extender: Rowan Blase in Treatment: 22 Constitutional Well-nourished and well-hydrated in no acute distress. Respiratory normal breathing without difficulty. Psychiatric this patient is able to make decisions and demonstrates good insight into disease process. Alert and Oriented x 3. pleasant and cooperative. Notes Upon inspection patient's wound bed actually showed signs of good granulation and epithelization at this point. Fortunately there does not appear to be any signs of active infection locally or systemically which is great news. No fevers, chills, nausea, vomiting, or diarrhea. Electronic Signature(s) Signed: 02/08/2022 10:24:38 AM By: Lenda Kelp PA-C Entered By: Lenda Kelp on 02/08/2022 10:24:37 Jeremy [name] ([id number]) -------------------------------------------------------------------------------- Physician Orders Details Patient Name: Jeremy [name] Date of Service: 02/08/2022 9:45 AM Medical Record Number: [id number] Patient Account Number: [id number] Date of Birth/Sex: 11-04-1955 (65 y.o. 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 Bathing/ Shower/ Hygiene o May shower; gently cleanse wound with antibacterial soap, rinse and pat dry prior to dressing wounds o No tub bath. Edema Control - Lymphedema / Segmental Compressive Device / Other o Elevate leg(s) parallel to the floor when sitting. o DO YOUR BEST to sleep in the bed at night. DO  NOT sleep in your recliner. Long hours of sitting in a recliner leads to swelling of the legs and/or potential wounds on your backside. Additional Orders / Instructions o Follow Nutritious Diet and Increase Protein Intake - monitor blood glucose to maintain normal level Culture obtained today Medications-Please add to medication list. o P.O. Antibiotics - continue Doxycycline Wound Treatment Wound #3 - Ankle Wound Laterality: Right, Lateral Cleanser: Normal Saline 1 x Per Day/30 Days Discharge Instructions: Wash your hands with soap and water. Remove old dressing, discard into plastic bag and place into trash. Cleanse the wound with Normal Saline prior to applying a clean dressing using gauze sponges, not tissues or cotton balls. Do not scrub or use excessive force. Pat dry using gauze sponges, not tissue or cotton balls. Primary Dressing: Hydrofera Blue Classic Foam Rope Dressing, 9x6 (mm/in) 1 x Per Day/30 Days Secondary Dressing: Telfa Adhesive Island Dressing, 4x4 (in/in) 1 x Per Day/30 Days Discharge Instructions: Apply over dressing to secure in place. Electronic Signature(s) Signed: 02/08/2022 5:10:30 PM By: [name] Signed: 02/08/2022 5:17:38 PM By: Lenda Kelp PA-C Entered By: [name] on  02/08/2022 10:20:35 Jeremy [name] ([id number]) -------------------------------------------------------------------------------- Problem List Details Patient Name: Jeremy [name] Date of Service: 02/08/2022 9:45 AM Medical Record Number: [id number] Patient Account Number: [id number] Date of Birth/Sex: 1955-06-25 (65 y.o. 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 without 09/07/2021 No Yes evidence of necrosis E11.622 Type 2 diabetes mellitus with other skin ulcer 09/07/2021 No Yes I10 Essential (primary) hypertension 09/07/2021 No Yes I25.10 Atherosclerotic heart disease of native coronary artery without angina 09/07/2021 No Yes pectoris Inactive Problems Resolved Problems Electronic Signature(s) Signed: 02/08/2022 9:50:24 AM By: Lenda Kelp PA-C Entered By: Lenda Kelp on 02/08/2022 09:50:24 Jeremy [name] ([id number]) -------------------------------------------------------------------------------- Progress Note Details Patient Name: Jeremy [name] Date of Service: 02/08/2022 9:45 AM Medical Record Number: [id number] Patient Account Number: [id number] Date of Birth/Sex: July 05, 1955 (65 y.o. M) Treating RN: Huel Coventry Primary Care Provider: Barbette Reichmann Other Clinician: [name] Referring Provider: Barbette Reichmann Treating Provider/Extender: Rowan Blase in Treatment: 22 Subjective Chief Complaint Information obtained from Patient Right ankle ulcer History of Present Illness (HPI) 08/18/2020 upon evaluation today patient presents for initial inspection here in  our clinic concerning issues has been having with his right lateral ankle and this has been present for at least a year he tells me. He is a patient of Dr. Orland Jarred. Subsequently Dr. Orland Jarred has since retired hence the patient look this up and is coming here for wound care to see if we can help him out. He did have a screw pushed out of this location and since that time tells me that the screw was removed but nonetheless the hole has remained. Fortunately there does not appear to be any signs of active infection systemically at this point though it does raise the question as to whether or not this might be a bone/hardware infection being that this has been open for so long. The patient does have a history of diabetes mellitus type 2, coronary artery disease, hypertension, and unfortunately does not seem to be doing as great as I would like to see him regard to his left ankle. 08/25/2020 upon  evaluation today patient appears to be doing well with regard to his ankle ulcer compared to last week this is definitely smaller. With that being said it still does probe down to bone/hardware. I still think this can be appropriate for him to see the orthopedic specialist. He has not heard from them yet he tells me. 09/05/2020 upon evaluation today patient appears to be doing extremely well in regard to his wound all things considered. I do not see that this is gotten any worse. Unfortunately also do not think he is gotten any better. We did make a referral to orthopedics to Dr. Victorino Dike specifically but the patient tells me that he has not heard from them. That is unusual as they normally get in touch with patients fairly quickly. I will ask him if he possibly missed a phone call he tells me he will check when he gets home. Nonetheless he does not want to go to [facility] anyway which he did not tell me previously therefore we will get a see about making a referral to Dr. Logan Bores to see what he potentially could  recommend for the patient as well I think Dr. Logan Bores is excellent and a good option here. Readmission: 10/28/2020 upon evaluation today patient appears to be doing about the same as when I last saw him. He did not come back for follow-up last time I saw him was 09/05/2020. Basically he tells me that he has been attempting to manage this on his own using the Hydrofera Blue rope. He did get a call from [facility] but they did not actually get him scheduled due to the fact that the patient told me that he told them he did not wanted to see them he just wanted to come back and see me because he was happy with my care. Nonetheless as I explained to the patient he has a fractured screw at the site of the wound he also has another screw where there appears to be some loosening of the screw and potential for hardware/bone infection here. Subsequently I think he really does need to see a specialist to see what they can do to help him out I do not believe him to be able to get this healed without taking care of the hardware issues. 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 He has been advised previous of a need for an above-knee amputation due to some of the aggressive breakdown in the ankle region. Nonetheless he has had the removal of the screw performed in office with Dr. Excell Seltzer and this was on 08/28/2021. Subsequently the patient is a poor historian he also gets upset very easily. Of note I do believe that he is going to require some dressings to be packed into the area due to the fact that to be honest this is still tracking all the way straight down to bone where there was pus and purulence coming out once I did get down to that area. Subsequently he tells me that he is having pain although it does seem to be doing better since the screw was removed. Unfortunately he is not good to be  hyperbaric oxygen therapy candidate due to the fact that his ejection fraction is 20% or less which is not compatible with getting in the chamber. Therefore his best options probably do entail the continue following up by Dr. Sampson Goon for infectious disease coupled with wound  care as best we can and try to see if we keep this clean as much as possible and keep it from becoming more infected. At some point there may be a time where this could continually get worse and may end up not doing nearly as well but right now he does not seem to be doing too poorly and I think this is the best way to support him. I am not overly confident that this is ever getting heal however. 09/15/21 upon evaluation today patient appears to be doing well with regard to his wound. He has been tolerating the dressing changes without complication. Fortunately I do not see any signs of active infection at this time. Overall I think that he is doing quite well. 09-22-2021 upon evaluation today patient appears to be doing about the same in regard to his ankle. 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 He again has been told that there is probably not a likely scenario in which this is able to heal. Nonetheless he is not a HBO candidate due to low ejection fraction. He does see Dr. Sampson GoonFitzgerald he still taking antibiotics at this point we will try to keep the area open so does not develop an abscess. 09-29-2021 upon evaluation today patient appears to be doing well with regard to his wound all things considered. He continues to use the [employer] rope. This is still a fairly deep wound that I think the rope is doing well to help keep the drainage from collecting in the base of the wound there does appear to be less drainage definitely not as purulent as what we have noticed in the past. There is no increased pain and no signs of worsening in  general. 10-06-2021 upon evaluation today patient's wound is continuing to show signs of issues here at this point. Fortunately I do not see any evidence of active infection locally or systemically which is great news but at the same time he is definitely having some ongoing problems here currently. 5/8; 2-week follow-up. Right medial ankle. Small wound purulent drainage. He is on doxycycline chronically for underlying osteomyelitis. He has Ell, Piers ([id number]) hardware in this area. He is using Hydrofera Blue packing strips 11-02-2021 upon evaluation today patient appears to be doing much better in regard to his wound I am very pleased to see this. I do not see any evidence of active infection locally nor systemically which is great news. No fevers, chills, nausea, vomiting, or diarrhea. 11-16-2021 upon evaluation today patient appears to be doing well with regard to his ankle ulcer this is a very tiny area remaining at this point. Fortunately I do not see any evidence of infection locally or systemically at this time. Overall I think we are on the right track. 11-30-2021 Upon inspection patient's wound bed actually showed signs of having just a very small opening that still between 1 to 2 mm max. The back end of the sterile Q-tip stick I was able to get down in here and it does still probe down to bone. With that being said he still has drainage as well. Nonetheless he has not wanted any further surgical intervention and so mainly is just more of something were doing to try to manage this as best we can do only thing really were doing a split Hydrofera Blue externally has a lot of new skin covering over where previously had a  lot of open area but at the same time I am still concerned about the fact this probes all the way down to bone. 12-14-2021 upon evaluation today patient appears to be doing about the same in regard to his wound. This still actually probes down to bone. He has not seen ID  anytime recently. Has been off of the doxycycline for quite some time. For that reason I really feel like we may need to see about getting something done a little bit more specific for him here and when to try to obtain a culture today so that we can see what that shows and maybe get him on antibiotics that could help this to heal faster. He is in agreement with that plan. 12-28-2021 upon evaluation today patient appears to be doing worse in regard to his wound. Unfortunately this has sealed up and does appear to be trapping quite a bit of fluid underneath the skin the skin is loose and honestly needs to be trimmed away I discussed that with the patient today. He voiced understanding and did consent to the debridement. This is good to be done with scissors and forceps. 01-11-2022 upon evaluation today patient's wound actually is showing signs of being a little better than last time I saw him. I do believe that the doxycycline has been effective in calming this down to some degree. Fortunately I do not see any evidence of active infection at this time systemically although locally he is actually having some signs of cellulitis though this is much better compared to 2 weeks ago when I last saw him. 01-25-2022 upon evaluation today patient appears to be doing well with regard to his foot ulcer all things considered. He still has a wound that goes deeper I think switching back to the [facility] rope is probably can to be better for him at this point. 02-08-2022 upon evaluation today patient appears to be doing well currently in regard to his wound all things considered. This is still chronic osteomyelitis issue at least from the previous evaluations. He has previously been on antibiotics for an extended period of time. Right now I have a Mannam which I started back on July 17 and that was for 2 refills he is still taking that. There was an initial 30-day supply. That should actually carry him from July 17  to October 17. With that being said I am really not sure that we are seeing a whole lot of improvement we seem to have some waxing and waning but would not ever really completely seeing this healed unfortunately. I do think the Hydrofera Blue rope is the best way to go however this still is going all the way down to bone and that has me concerned. I discussed this with the patient before but he really does not want to take any further surgical intervention he mainly is just wanting to try let this heal its own way and he is happy with the way things are going at the moment. Objective Constitutional Well-nourished and well-hydrated in no acute distress. Vitals Time Taken: 9:55 AM, Height: 71 in, Weight: 250 lbs, BMI: 34.9, Temperature: 97.6 F, Pulse: 120 bpm, Respiratory Rate: 18 breaths/min, Blood Pressure: 147/80 mmHg. Respiratory normal breathing without difficulty. Psychiatric this patient is able to make decisions and demonstrates good insight into disease process. Alert and Oriented x 3. pleasant and cooperative. General Notes: Upon inspection patient's wound bed actually showed signs of good granulation and epithelization at this point. Fortunately there  does not appear to be any signs of active infection locally or systemically which is great news. No fevers, chills, nausea, vomiting, or diarrhea. Integumentary (Hair, Skin) Wound #3 status is Open. Original cause of wound was Gradually Appeared. The date acquired was: 08/24/2021. The wound has been in treatment 22 weeks. The wound is located on the Right,Lateral Ankle. The wound measures 0.4cm length x 0.4cm width x 1.1cm depth; 0.126cm^2 area and 0.138cm^3 volume. There is bone and Fat Layer (Subcutaneous Tissue) exposed. There is a medium amount of purulent drainage noted. There is medium (34-66%) pink granulation within the wound bed. There is a small (1-33%) amount of necrotic tissue within the wound bed including Adherent  Slough. 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 Bathing/ Shower/ Hygiene: May shower; gently cleanse wound with antibacterial soap, rinse and pat dry prior to dressing wounds No tub bath. Edema Control - Lymphedema / Segmental Compressive Device / Other: Elevate leg(s) parallel to the floor when sitting. DO YOUR BEST to sleep in the bed at night. DO NOT sleep in your recliner. Long hours of sitting in a recliner leads to swelling of the legs and/or potential wounds on your backside. Additional Orders / Instructions: Follow Nutritious Diet and Increase Protein Intake - monitor blood glucose to maintain normal level Culture obtained today Medications-Please add to medication list.: P.O. Antibiotics - continue Doxycycline WOUND #3: - Ankle Wound Laterality: Right, Lateral Cleanser: Normal Saline 1 x Per Day/30 Days Discharge Instructions: Wash your hands with soap and water. Remove old dressing, discard into plastic bag and place into trash. Cleanse the wound with Normal Saline prior to applying a clean dressing using gauze sponges, not tissues or cotton balls. Do not scrub or use excessive force. Pat dry using gauze sponges, not tissue or cotton balls. Primary Dressing: Hydrofera Blue Classic Foam Rope Dressing, 9x6 (mm/in) 1 x Per Day/30 Days Secondary Dressing: Telfa Adhesive Island Dressing, 4x4 (in/in) 1 x Per Day/30 Days Discharge Instructions: Apply over dressing to secure in place. 1. I would recommend currently based on what I am seeing that we go ahead and have the patient continue  with the doxycycline again had given this to him for 3 months starting in July which should carry him through mid October at least. 2. I am also can recommend that we continue with the Hydrofera Blue rope. 3. I am also going to suggest that we have the patient continue with the Telfa island dressing to cover which I think is doing decently well. We will see patient back for reevaluation in 2 weeks here in the clinic. If anything worsens or changes patient will contact our office for additional recommendations. Electronic Signature(s) Signed: 02/08/2022 10:25:12 AM By: Lenda Kelp PA-C Entered By: Lenda Kelp on 02/08/2022 10:25:11 Jeremy [name] ([id number]) -------------------------------------------------------------------------------- SuperBill Details Patient Name: Jeremy [name] Date of Service: 02/08/2022 Medical Record Number: [id number] Patient Account Number: [id number] Date of Birth/Sex: August 16, 1955 (65 y.o. M) Treating RN: Huel Coventry Primary Care Provider: Barbette Reichmann Other Clinician: [name] Referring Provider: Barbette Reichmann Treating Provider/Extender: Rowan Blase in Treatment: 22 Diagnosis Coding ICD-10 Codes Code Description M86.471 Chronic osteomyelitis with draining sinus, right ankle and foot L97.316 Non-pressure  chronic ulcer of right ankle with bone involvement without evidence of necrosis E11.622 Type 2 diabetes mellitus with other skin ulcer I10 Essential (primary) hypertension I25.10 Atherosclerotic heart disease of native coronary artery without angina pectoris Facility Procedures CPT4 Code: 21308657 Description: 99213 - WOUND CARE VISIT-LEV 3 EST PT Modifier: Quantity: 1 Physician Procedures CPT4 Code Description: 8469629 99214 - WC PHYS LEVEL 4 - EST PT Modifier: Quantity: 1 CPT4 Code Description: ICD-10 Diagnosis Description M86.471 Chronic osteomyelitis with draining sinus, right ankle and foot L97.316 Non-pressure  chronic ulcer of right ankle with bone involvement without E11.622 Type 2 diabetes mellitus with other skin  ulcer I10 Essential (primary) hypertension Modifier: evidence of necrosi Quantity: s Electronic Signature(s) Signed: 02/08/2022 10:25:39 AM By: Lenda Kelp PA-C Previous Signature: 02/08/2022 10:25:25 AM Version By: Lenda Kelp PA-C Entered By: Lenda Kelp on 02/08/2022 10:25:38

## 2022-02-22 ENCOUNTER — Ambulatory Visit: Payer: Medicaid Other | Admitting: Physician Assistant

## 2022-02-23 ENCOUNTER — Encounter: Payer: Medicaid Other | Attending: Physician Assistant | Admitting: Physician Assistant

## 2022-02-23 DIAGNOSIS — M1990 Unspecified osteoarthritis, unspecified site: Secondary | ICD-10-CM | POA: Diagnosis not present

## 2022-02-23 DIAGNOSIS — I509 Heart failure, unspecified: Secondary | ICD-10-CM | POA: Insufficient documentation

## 2022-02-23 DIAGNOSIS — E11622 Type 2 diabetes mellitus with other skin ulcer: Secondary | ICD-10-CM | POA: Diagnosis present

## 2022-02-23 DIAGNOSIS — L97316 Non-pressure chronic ulcer of right ankle with bone involvement without evidence of necrosis: Secondary | ICD-10-CM | POA: Insufficient documentation

## 2022-02-23 DIAGNOSIS — I110 Hypertensive heart disease with heart failure: Secondary | ICD-10-CM | POA: Insufficient documentation

## 2022-02-23 DIAGNOSIS — I2510 Atherosclerotic heart disease of native coronary artery without angina pectoris: Secondary | ICD-10-CM | POA: Diagnosis not present

## 2022-02-23 DIAGNOSIS — E1151 Type 2 diabetes mellitus with diabetic peripheral angiopathy without gangrene: Secondary | ICD-10-CM | POA: Insufficient documentation

## 2022-02-23 NOTE — Progress Notes (Signed)
AFTON, MIKELSON ([id number]) Visit Report for 02/23/2022 Chief Complaint Document Details Patient Name: MCGWIRE, DASARO Date of Service: 02/23/2022 9:00 AM Medical Record Number: [id number] Patient Account Number: [id number] Date of Birth/Sex: [birth year] (65 y.o. M) Treating RN: Huel Coventry Primary Care Provider: Barbette Reichmann Other Clinician: [name] Referring Provider: Barbette Reichmann Treating Provider/Extender: Rowan Blase in Treatment: 24 Information Obtained from: Patient Chief Complaint Right ankle ulcer Electronic Signature(s) Signed: 02/23/2022 9:20:04 AM By: Lenda Kelp PA-C Entered By: Lenda Kelp on 02/23/2022 09:20:04 [name] ([id number]) -------------------------------------------------------------------------------- Problem List Details Patient Name: [name] Date of Service: 02/23/2022 9:00 AM Medical Record Number: [id number] Patient Account Number: [id number] Date of Birth/Sex: June 17, 1955 (65 y.o. 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

## 2022-02-24 NOTE — Progress Notes (Addendum)
EXCELL, NEYLAND ([id number]) Visit Report for 02/23/2022 Arrival Information Details Patient Name: Jeremy [name], Jeremy [name] Date of Service: 02/23/2022 9:00 AM Medical Record Number: [id number] Patient Account Number: [id number] Date of Birth/Sex: 11/06/1955 (66 y.o. M) Treating RN: Huel Coventry Primary Care Vylette Strubel: Barbette Reichmann Other Clinician: [name] Referring Keanan Melander: Barbette Reichmann Treating Zevin Nevares/Extender: Rowan Blase in Treatment: 24 Visit Information History Since Last Visit All ordered tests and consults were completed: No Patient Arrived: Ambulatory Added or deleted any medications: No Arrival Time: 09:08 Any new allergies or adverse reactions: No Transfer Assistance: None Had a fall or experienced change in No Patient Requires Transmission-Based No activities of daily living that may affect Precautions: risk of falls: Patient Has Alerts: Yes Hospitalized since last visit: No Patient Alerts: Patient on Blood Pain Present Now: No Thinner Eliquis Electronic Signature(s) Signed: 02/24/2022 5:04:43 PM By: [name] Entered By: [name] on 02/23/2022 09:13:08 Jeremy [name] ([id number]) -------------------------------------------------------------------------------- Clinic Level of Care Assessment Details Patient Name: Jeremy [name] Date of Service: 02/23/2022 9:00 AM Medical Record Number: [id number] Patient Account Number: [id number] Date of Birth/Sex: 1955-09-04 (66 y.o. M) Treating RN: Huel Coventry Primary Care Arvon Schreiner: Barbette Reichmann Other Clinician: [name] Referring Rhaya Coale: Barbette Reichmann Treating Jeanne Terrance/Extender: Rowan Blase in Treatment: 24 Clinic Level of Care Assessment Items TOOL 4 Quantity Score []  - Use when only an EandM is performed on FOLLOW-UP visit 0 ASSESSMENTS - Nursing Assessment / Reassessment X - Reassessment of Co-morbidities (includes updates in patient status) 1 10 X- 1 5 Reassessment  of Adherence to Treatment Plan ASSESSMENTS - Wound and Skin Assessment / Reassessment X - Simple Wound Assessment / Reassessment - one wound 1 5 []  - 0 Complex Wound Assessment / Reassessment - multiple wounds []  - 0 Dermatologic / Skin Assessment (not related to wound area) ASSESSMENTS - Focused Assessment []  - Circumferential Edema Measurements - multi extremities 0 []  - 0 Nutritional Assessment / Counseling / Intervention []  - 0 Lower Extremity Assessment (monofilament, tuning fork, pulses) []  - 0 Peripheral Arterial Disease Assessment (using hand held doppler) ASSESSMENTS - Ostomy and/or Continence Assessment and Care []  - Incontinence Assessment and Management 0 []  - 0 Ostomy Care Assessment and Management (repouching, etc.) PROCESS - Coordination of Care X - Simple Patient / Family Education for ongoing care 1 15 []  - 0 Complex (extensive) Patient / Family Education for ongoing care []  - 0 Staff obtains , Records, Test Results / Process Orders []  - 0 Staff telephones HHA, Nursing Homes / Clarify orders / etc []  - 0 Routine Transfer to another Facility (non-emergent condition) []  - 0 Routine Hospital Admission (non-emergent condition) []  - 0 New Admissions / / Ordering NPWT, Apligraf, etc. []  - 0 Emergency Hospital Admission (emergent condition) X- 1 10 Simple Discharge Coordination []  - 0 Complex (extensive) Discharge Coordination PROCESS - Special Needs []  - Pediatric / Minor Patient Management 0 []  - 0 Isolation Patient Management []  - 0 Hearing / Language / Visual special needs []  - 0 Assessment of Community assistance (transportation, D/C planning, etc.) []  - 0 Additional assistance / Altered mentation []  - 0 Support Surface(s) Assessment (bed, cushion, seat, etc.) INTERVENTIONS - Wound Cleansing / Measurement Reeves, Zair ( ) X- 1 5 Simple Wound Cleansing - one wound []  - 0 Complex Wound Cleansing -  multiple wounds X- 1 5 Wound Imaging (photographs - any number of wounds) []  - 0 Wound Tracing (instead of photographs) X- 1 5 Simple Wound Measurement - one wound []  - 0 Complex  Wound Measurement - multiple wounds INTERVENTIONS - Wound Dressings []  - Small Wound Dressing one or multiple wounds 0 X- 1 15 Medium Wound Dressing one or multiple wounds []  - 0 Large Wound Dressing one or multiple wounds []  - 0 Application of Medications - topical []  - 0 Application of Medications - injection INTERVENTIONS - Miscellaneous []  - External ear exam 0 []  - 0 Specimen Collection (cultures, biopsies, blood, body fluids, etc.) []  - 0 Specimen(s) / Culture(s) sent or taken to Lab for analysis []  - 0 Patient Transfer (multiple staff / / Similar devices) []  - 0 Simple Staple / Suture removal (25 or less) []  - 0 Complex Staple / Suture removal (26 or more) []  - 0 Hypo / Hyperglycemic Management (close monitor of Blood Glucose) []  - 0 Ankle / Brachial Index (ABI) - do not check if billed separately X- 1 5 Vital Signs Has the patient been seen at the hospital within the last three years: Yes Total Score: 80 Level Of Care: New/Established - Level 3 Electronic Signature(s) Signed: 02/24/2022 5:04:43 PM By: Entered By: on 02/23/2022 09:51:17 ( ) -------------------------------------------------------------------------------- Complex / Palliative Patient Assessment Details Patient Name: Date of Service: 02/23/2022 9:00 AM Medical Record Number: Patient Account Number: Date of Birth/Sex: 04-09-1956 (66 y.o. M) Treating RN: 02/26/2022 Primary Care Laporcha Marchesi: [name] Other Clinician: [name] Referring Vivan Agostino: 04/25/2022 Treating Tayshawn Purnell/Extender: Jeremy [name] in Treatment: 24 Complex Wound Management Criteria Patient is unable to adhere to an Advanced  Wound Management Plan. o Agreement to Receive Wound Care has been implemented. Patient demonstrates continued inability to adhere to an Advanced Wound Management Plan, the patient has been presented with the option of continuing the Advanced Plan or implementing the Complex Wound Management Plan. o Patient or healthcare surrogate must agree to Complex Wound Management Plan. Palliative Wound Management Criteria Care Approach Wound Care Plan: Complex Wound Management Electronic Signature(s) Signed: 02/26/2022 1:52:17 PM By: 04/25/2022, BSN, RN, CWS, Kim RN, BSN Signed: 02/26/2022 1:56:40 PM By: [id number] PA-C Entered By: 06/09/1956, BSN, RN, CWS, Kim on 02/26/2022 13:52:17 Huel Coventry (Barbette Reichmann) -------------------------------------------------------------------------------- Encounter Discharge Information Details Patient Name: [name] Date of Service: 02/23/2022 9:00 AM Medical Record Number: Rowan Blase Patient Account Number: 25 Date of Birth/Sex: 1955/07/31 (66 y.o. M) Treating RN: 02/28/2022 Primary Care Nadirah Socorro: Lenda Kelp Other Clinician: Elliot Gurney Referring Terrilee Dudzik: 02/28/2022 Treating Lanae Federer/Extender: Jeremy [name] in Treatment: 24 Encounter Discharge Information Items Discharge Condition: Stable Ambulatory Status: Ambulatory Discharge Destination: Home Transportation: Private Auto Accompanied By: self Schedule Follow-up Appointment: Yes Clinical Summary of Care: Electronic Signature(s) Signed: 02/24/2022 5:04:43 PM By: 04/25/2022 Entered By: [id number] on 02/23/2022 17:03:55 06/09/1956 (09-07-1989) -------------------------------------------------------------------------------- Lower Extremity Assessment Details Patient Name: Huel Coventry Date of Service: 02/23/2022 9:00 AM Medical Record Number: [name] Patient Account Number: Barbette Reichmann Date of Birth/Sex: 13-Sep-1955 (66 y.o. M) Treating RN: 02/26/2022 Primary Care Randle Shatzer: [name] Other Clinician: [name] Referring Hermilo Dutter: 04/25/2022 Treating Magdaline Zollars/Extender: Jeremy [name] in Treatment: 24 Electronic Signature(s) Signed: 02/23/2022 5:46:57 PM By: Jeremy [name] BSN, RN, CWS, Kim RN, BSN Signed: 02/24/2022 5:04:43 PM By: [id number] Entered By: [id number] on 02/23/2022 09:25:45 09-07-1989 (Huel Coventry) -------------------------------------------------------------------------------- Multi Wound Chart Details Patient Name: Barbette Reichmann Date of Service: 02/23/2022 9:00 AM Medical Record Number: Barbette Reichmann Patient Account Number: Rowan Blase Date of Birth/Sex: [birth year] (66 y.o. 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] [N/A:N/A] Secondary Etiology: [3:Diabetic Wound/Ulcer of the Lower Extremity] [N/A:N/A] Comorbid History: [3:Arrhythmia, Congestive Heart Failure, Coronary Artery Disease, Hypertension, Type II Diabetes, Osteoarthritis, Osteomyelitis] [N/A:N/A] Date Acquired: [3:08/24/2021] [N/A:N/A] Weeks of Treatment: [3:24] [N/A:N/A] Wound Status: [3:Open] [N/A:N/A] Wound Recurrence: [3:No] [N/A:N/A] Pending Amputation on [3:Yes] [N/A:N/A] Presentation: Measurements L x W x D (cm) [3:0.8x0.6x1] [N/A:N/A] Area (cm) : [3:0.377] [N/A:N/A] Volume (cm) : [3:0.377] [N/A:N/A] % Reduction in Area: [3:-1116.10%] [N/A:N/A] % Reduction in Volume: [3:-1613.60%] [N/A:N/A] Classification:  [3:Full Thickness With Exposed Support Structures] [N/A:N/A] Exudate Amount: [3:Medium] [N/A:N/A] Exudate Type: [3:Purulent] [N/A:N/A] Exudate Color: [3:yellow, brown, green] [N/A:N/A] Granulation Amount: [3:Medium (34-66%)] [N/A:N/A] Granulation Quality: [3:Pink] [N/A:N/A] Necrotic Amount: [3:Small (1-33%)] [N/A:N/A] Exposed Structures: [3:Fat Layer (Subcutaneous Tissue): Yes Bone: Yes Fascia: No Tendon: No Muscle: No Joint: No Small (1-33%)] [N/A:N/A N/A] Treatment Notes Electronic Signature(s) Signed: 02/24/2022 5:04:43 PM By: [name] Entered By: [name] on 02/23/2022 09:25:56 Jeremy [name] ([id number]) -------------------------------------------------------------------------------- Multi-Disciplinary Care Plan Details Patient Name: Jeremy [name] Date of Service: 02/23/2022 9:00 AM Medical Record Number: [id number] Patient Account Number: [id number] Date of Birth/Sex: 12/12/1955 (66 y.o. M) Treating RN: Huel Coventry Primary Care Vlad Mayberry: Barbette Reichmann Other Clinician: [name] Referring Myrtle Haller: Barbette Reichmann Treating Juel Bellerose/Extender: Rowan Blase in Treatment: 24 Active Inactive Osteomyelitis Nursing Diagnoses: Knowledge deficit related to disease process and management Potential for infection: osteomyelitis Goals: Patient/caregiver will verbalize understanding of disease process and disease management Date Initiated: 09/07/2021 Target Resolution Date: 09/18/2021 Goal Status: Active Interventions: Assess for signs and symptoms of osteomyelitis resolution every visit Provide education on osteomyelitis Notes: Wound/Skin Impairment Nursing Diagnoses: Impaired tissue integrity Knowledge deficit related to smoking impact on wound healing Knowledge deficit related to ulceration/compromised skin integrity Goals: Patient will demonstrate a reduced rate of smoking or cessation of smoking Date Initiated: 09/07/2021 Target Resolution Date:  10/16/2021 Goal Status: Active Patient/caregiver will verbalize understanding of skin care regimen Date Initiated: 09/07/2021 Target Resolution Date: 09/18/2021 Goal Status: Active Ulcer/skin breakdown will have a volume reduction of 30% by week 4 Date Initiated: 09/07/2021 Target Resolution Date: 10/05/2021 Goal Status: Active Ulcer/skin breakdown will have a volume reduction of 50% by week 8 Date Initiated: 09/07/2021 Target Resolution Date: 11/02/2021 Goal Status: Active Ulcer/skin breakdown will have a volume reduction of 80% by week 12 Date Initiated: 09/07/2021 Target Resolution Date: 11/30/2021 Goal Status: Active Ulcer/skin breakdown will heal within 14 weeks Date Initiated: 09/07/2021 Target Resolution Date: 12/14/2021 Goal Status: Active Interventions: Assess patient/caregiver ability to obtain necessary supplies Assess patient/caregiver ability to perform ulcer/skin care regimen upon admission and as needed Assess ulceration(s) every visit Notes: Electronic Signature(s) OMARIO, ANDER ([id number]) Signed: 02/23/2022 5:46:57 PM By: Elliot Gurney, BSN, RN, CWS, Kim RN, BSN Signed: 02/24/2022 5:04:43 PM By: [name] Entered By: [name] on 02/23/2022 09:25:49 Jeremy [name] ([id number]) -------------------------------------------------------------------------------- Pain Assessment Details Patient Name: Jeremy [name] Date of Service: 02/23/2022 9:00 AM Medical Record Number: [id number] Patient Account Number: [id number] Date of Birth/Sex: Sep 02, 1955 (66 y.o. M) Treating RN: Huel Coventry Primary Care Larraine Argo: Barbette Reichmann Other Clinician: [name] Referring Gladie Gravette: Barbette Reichmann Treating Thresia Ramanathan/Extender: Rowan Blase in Treatment: 24 Active Problems Location of Pain Severity and Description of Pain Patient Has Paino No Site Locations  Pain Management and Medication Current Pain Management: Electronic Signature(s) Signed: 02/23/2022 5:46:57 PM By:  Elliot Gurney, BSN, RN, CWS, Kim RN, BSN Signed: 02/24/2022 5:04:43 PM By: [name] Entered By: [name] on 02/23/2022 09:19:22 Jeremy [name] ([id number]) -------------------------------------------------------------------------------- Patient/Caregiver Education Details Patient Name: Jeremy [name] Date of Service: 02/23/2022 9:00 AM Medical Record Number: [id number] Patient Account Number: [id number] Date of Birth/Gender: [birth year] (66 y.o. M) Treating RN: Huel Coventry Primary Care Physician: Barbette Reichmann Other Clinician: [name] Referring Physician: Barbette Reichmann Treating Physician/Extender: Rowan Blase in Treatment: 24 Education Assessment Education Provided To: Patient Education Topics Provided Wound/Skin Impairment: Handouts: Other: continue wound care as directed Responses: State content correctly Electronic Signature(s) Signed: 02/24/2022 5:04:43 PM By: [name] Entered By: [name] on 02/23/2022 16:55:41 Jeremy [name] ([id number]) -------------------------------------------------------------------------------- Wound Assessment Details Patient Name: Jeremy [name] Date of Service: 02/23/2022 9:00 AM Medical Record Number: [id number] Patient Account Number: [id number] Date of Birth/Sex: 1956-04-23 (66 y.o. 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 Wounding Event: Gradually Appeared Etiology: Date Acquired: 08/24/2021 Wound Open Weeks Of Treatment: 24 Status: Clustered Wound: No Comorbid Arrhythmia, Congestive Heart Failure, Coronary Artery Pending Amputation On Presentation History: Disease,  Hypertension, Type II Diabetes, Osteoarthritis, Osteomyelitis Wound Measurements Length: (cm) 0.8 Width: (cm) 0.6 Depth: (cm) 1 Area: (cm) 0.377 Volume: (cm) 0.377 % Reduction in Area: -1116.1% % Reduction in Volume: -1613.6% Epithelialization: Small (1-33%) Wound Description Classification: Full Thickness With Exposed Support Structures Exudate Amount: Medium Exudate Type: Purulent Exudate Color: yellow, brown, green Foul Odor After Cleansing: No Slough/Fibrino Yes Wound Bed Granulation Amount: Medium (34-66%) Exposed Structure Granulation Quality: Pink Fascia Exposed: No Necrotic Amount: Small (1-33%) Fat Layer (Subcutaneous Tissue) Exposed: Yes Necrotic Quality: Adherent Slough Tendon Exposed: No Muscle Exposed: No Joint Exposed: No Bone Exposed: Yes Treatment Notes Wound #3 (Ankle) Wound Laterality: Right, Lateral Cleanser Peri-Wound Care Topical Primary Dressing Secondary Dressing Secured With Compression Wrap Compression Stockings Add-Ons Electronic Signature(s) Signed: 02/23/2022 5:46:57 PM By: Elliot Gurney, BSN, RN, CWS, Kim RN, BSN Signed: 02/24/2022 5:04:43 PM By: Dillard Cannon, Gaynell Face ([id number]) Entered By: [name] on 02/23/2022 09:25:36 Jeremy [name] ([id number]) -------------------------------------------------------------------------------- Vitals Details Patient Name: Jeremy [name] Date of Service: 02/23/2022 9:00 AM Medical Record Number: [id number] Patient Account Number: [id number] Date of Birth/Sex: December 13, 1955 (66 y.o. M) Treating RN: Huel Coventry Primary Care Jolita Haefner: Barbette Reichmann Other Clinician: [name] Referring Klyn Kroening: Barbette Reichmann Treating Brogen Duell/Extender: Rowan Blase in Treatment: 24 Vital Signs Time Taken: 09:14 Temperature (F): 97.7 Height (in): 71 Pulse (bpm): 67 Weight (lbs): 250 Respiratory Rate (breaths/min): 18 Body Mass Index (BMI): 34.9 Blood Pressure (mmHg):  158/86 Reference Range: 80 - 120 mg / dl Electronic Signature(s) Signed: 02/24/2022 5:04:43 PM By: [name] Entered By: [name] on 02/23/2022 09:19:00

## 2022-03-09 ENCOUNTER — Other Ambulatory Visit: Payer: Self-pay | Admitting: Physician Assistant

## 2022-03-09 ENCOUNTER — Encounter: Payer: Medicaid Other | Admitting: Physician Assistant

## 2022-03-09 ENCOUNTER — Ambulatory Visit
Admission: RE | Admit: 2022-03-09 | Discharge: 2022-03-09 | Disposition: A | Discharge status: Home / Self Care | Payer: Medicaid Other | Source: Ambulatory Visit | Attending: Physician Assistant | Admitting: Physician Assistant

## 2022-03-09 DIAGNOSIS — M869 Osteomyelitis, unspecified: Secondary | ICD-10-CM

## 2022-03-09 DIAGNOSIS — E11622 Type 2 diabetes mellitus with other skin ulcer: Secondary | ICD-10-CM | POA: Diagnosis not present

## 2022-03-09 NOTE — Progress Notes (Addendum)
DAREN, YEAGLE ([id number]) Visit Report for 03/09/2022 Chief Complaint Document Details Patient Name: Jeremy [name], Jeremy [name] Date of Service: 03/09/2022 9:00 AM Medical Record Number: [id number] Patient Account Number: [id number] Date of Birth/Sex: December 31, 1955 (65 y.o. M) Treating RN: Huel Coventry Primary Care Provider: Barbette Reichmann Other Clinician: [name] Referring Provider: Barbette Reichmann Treating Provider/Extender: Rowan Blase in Treatment: 26 Information Obtained from: Patient Chief Complaint Right ankle ulcer Electronic Signature(s) Signed: 03/09/2022 9:09:13 AM By: Lenda Kelp PA-C Entered By: Lenda Kelp on 03/09/2022 09:09:13 Jeremy [name] ([id number]) -------------------------------------------------------------------------------- HPI Details Patient Name: Jeremy [name] Date of Service: 03/09/2022 9:00 AM Medical Record Number: [id number] Patient Account Number: [id number] Date of Birth/Sex: 1955-07-12 (65 y.o. M) Treating RN: Huel Coventry Primary Care Provider: Barbette Reichmann Other Clinician: [name] Referring Provider: Barbette Reichmann Treating Provider/Extender: Rowan Blase in Treatment: 26 History of Present Illness HPI Description: 08/18/2020 upon evaluation today patient presents for initial inspection here in our clinic concerning issues has been having with his right lateral ankle and this has been present for at least a year he tells me. He is a patient of Dr. Orland Jarred. Subsequently Dr. Orland Jarred has since retired hence the patient look this up and is coming here for wound care to see if we can help him out. He did have a screw pushed out of this location and since that time tells me that the screw was removed but nonetheless the hole has remained. Fortunately there does not appear to be any signs of active infection systemically at this point though it does raise the question as to whether or not this might be a  bone/hardware infection being that this has been open for so long. The patient does have a history of diabetes mellitus type 2, coronary artery disease, hypertension, and unfortunately does not seem to be doing as great as I would like to see him regard to his left ankle. 08/25/2020 upon evaluation today patient appears to be doing well with regard to his ankle ulcer compared to last week this is definitely smaller. With that being said it still does probe down to bone/hardware. I still think this can be appropriate for him to see the orthopedic specialist. He has not heard from them yet he tells me. 09/05/2020 upon evaluation today patient appears to be doing extremely well in regard to his wound all things considered. I do not see that this is gotten any worse. Unfortunately also do not think he is gotten any better. We did make a referral to orthopedics to Dr. Victorino Dike specifically but the patient tells me that he has not heard from them. That is unusual as they normally get in touch with patients fairly quickly. I will ask him if he possibly missed a phone call he tells me he will check when he gets home. Nonetheless he does not want to go to [facility] anyway which he did not tell me previously therefore we will get a see about making a referral to Dr. Logan Bores to see what he potentially could recommend for the patient as well I think Dr. Logan Bores is excellent and a good option here. Readmission: 10/28/2020 upon evaluation today patient appears to be doing about the same as when I last saw him. He did not come back for follow-up last time I saw him was 09/05/2020. Basically he tells me that he has been attempting to manage this on his own using the Hydrofera Blue rope. He did get a call from [facility] but they  did not actually get him scheduled due to the fact that the patient told me that he told them he did not wanted to see them he just wanted to come back and see me because he was happy with my  care. Nonetheless as I explained to the patient he has a fractured screw at the site of the wound he also has another screw where there appears to be some loosening of the screw and potential for hardware/bone infection here. Subsequently I think he really does need to see a specialist to see what they can do to help him out I do not believe him to be able to get this healed without taking care of the hardware issues. 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 He has been advised previous of a need for an above-knee amputation due to some of the aggressive breakdown in the ankle region. Nonetheless he has had the removal of the screw performed in office with Dr. Luana Shu and this was on 08/28/2021. Subsequently the patient is a poor historian he also gets upset very easily. Of note I do believe that he is going to require some dressings to be packed into the area due to the fact that to be honest this is still tracking all the way straight down to bone where there was pus and purulence coming out once I did get down to that area. Subsequently he tells me that he is having pain although it does seem to be doing better since the screw was removed. 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 Therefore his best options probably do entail the continue following up by Dr. Ola Spurr for infectious disease coupled with wound care as best we can and try to see if we keep this clean as much as possible and keep it from becoming more infected. At some point there may be a time where this could continually get worse and may end up not doing nearly as well but right now he does not seem to be doing too poorly and I think this is the best way to support him. I am not overly  confident that this is ever getting heal however. 09/15/21 upon evaluation today patient appears to be doing well with regard to his wound. He has been tolerating the dressing changes without complication. Fortunately I do not see any signs of active infection at this time. Overall I think that he is doing quite well. 09-22-2021 upon evaluation today patient appears to be doing about the same in regard to his ankle. 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 He again has been told that there is probably not a likely scenario in which this is able to heal. Nonetheless he is not a HBO candidate due to low ejection fraction. He does see Dr. Ola Spurr he still taking antibiotics at this point we will try to keep the area open so does not develop an abscess. 09-29-2021 upon evaluation today patient appears to be doing well with regard to his wound all things considered. He continues to use the [employer] rope. This is still a fairly deep wound that I think the rope is doing well to help  keep the drainage from collecting in the base of the wound there does appear to be less drainage definitely not as purulent as what we have noticed in the past. There is no increased pain and no signs of worsening in general. 10-06-2021 upon evaluation today patient's wound is continuing to show signs of issues here at this point. Fortunately I do not see any evidence of active infection locally or systemically which is great news but at the same time he is definitely having some ongoing problems here currently. 5/8; 2-week follow-up. Right medial ankle. Small wound purulent drainage. He is on doxycycline chronically for underlying osteomyelitis. He has hardware in this area. He is using Hydrofera Blue packing strips 11-02-2021 upon evaluation today patient appears to be doing much better in regard to his wound I am very pleased to see this.  I do not see any evidence of active infection locally nor systemically which is great news. No fevers, chills, nausea, vomiting, or diarrhea. Jeremy [name], Jeremy [name] ([id number]) 11-16-2021 upon evaluation today patient appears to be doing well with regard to his ankle ulcer this is a very tiny area remaining at this point. Fortunately I do not see any evidence of infection locally or systemically at this time. Overall I think we are on the right track. 11-30-2021 Upon inspection patient's wound bed actually showed signs of having just a very small opening that still between 1 to 2 mm max. The back end of the sterile Q-tip stick I was able to get down in here and it does still probe down to bone. With that being said he still has drainage as well. Nonetheless he has not wanted any further surgical intervention and so mainly is just more of something were doing to try to manage this as best we can do only thing really were doing a split Hydrofera Blue externally has a lot of new skin covering over where previously had a lot of open area but at the same time I am still concerned about the fact this probes all the way down to bone. 12-14-2021 upon evaluation today patient appears to be doing about the same in regard to his wound. This still actually probes down to bone. He has not seen ID anytime recently. Has been off of the doxycycline for quite some time. For that reason I really feel like we may need to see about getting something done a little bit more specific for him here and when to try to obtain a culture today so that we can see what that shows and maybe get him on antibiotics that could help this to heal faster. He is in agreement with that plan. 12-28-2021 upon evaluation today patient appears to be doing worse in regard to his wound. Unfortunately this has sealed up and does appear to be trapping quite a bit of fluid underneath the skin the skin is loose and honestly needs to be trimmed away I discussed  that with the patient today. He voiced understanding and did consent to the debridement. This is good to be done with scissors and forceps. 01-11-2022 upon evaluation today patient's wound actually is showing signs of being a little better than last time I saw him. I do believe that the doxycycline has been effective in calming this down to some degree. Fortunately I do not see any evidence of active infection at this time systemically although locally he is actually having some signs of cellulitis though this is much better compared to 2 weeks ago when  I last saw him. 01-25-2022 upon evaluation today patient appears to be doing well with regard to his foot ulcer all things considered. He still has a wound that goes deeper I think switching back to the [facility] rope is probably can to be better for him at this point. 02-08-2022 upon evaluation today patient appears to be doing well currently in regard to his wound all things considered. This is still chronic osteomyelitis issue at least from the previous evaluations. He has previously been on antibiotics for an extended period of time. Right now I have a Mannam which I started back on July 17 and that was for 2 refills he is still taking that. There was an initial 30-day supply. That should actually carry him from July 17 to October 17. With that being said I am really not sure that we are seeing a whole lot of improvement we seem to have some waxing and waning but would not ever really completely seeing this healed unfortunately. I do think the Hydrofera Blue rope is the best way to go however this still is going all the way down to bone and that has me concerned. I discussed this with the patient before but he really does not want to take any further surgical intervention he mainly is just wanting to try let this heal its own way and he is happy with the way things are going at the moment. 02-23-2022 upon evaluation today patient appears to be  doing well currently in regard to his wound all things considered. He still taking the doxycycline which I do have him on long-term in order to keep this under control. He was post to be on it by infectious disease but when that ran out I continued this as he has continued and ongoing chronic osteomyelitis of the ankle. He has been recommended amputation but he does not want to proceed with that. He also is not a HBO candidate at this point. He also tells me that he really feels like it is "doing better even though the size everything is really about the same there is no significant improvement in general here. 03-09-2022 upon evaluation today patient appears to be doing decently well in regard to his wound. This is still probing down to bone however. Again we discussed treatment options which have always included amputation and IV antibiotics but which the patient has declined in the past. Subsequently we have had him on oral antibiotics for some time and this does seem to help to a degree. With that being said I do not see any evidence of infection locally or systemically right now although he does still have some drainage this is not doing nearly as poorly as what it had been in the past. With that being said I do think that doing an updated x-ray would not be a bad idea based on where we stand currently as well. That was discussed with the patient today. Electronic Signature(s) Signed: 03/09/2022 11:18:25 AM By: Lenda Kelp PA-C Entered By: Lenda Kelp on 03/09/2022 11:18:25 Jeremy [name], Jeremy [name] ([id number]) -------------------------------------------------------------------------------- Physical Exam Details Patient Name: Jeremy [name] Date of Service: 03/09/2022 9:00 AM Medical Record Number: [id number] Patient Account Number: [id number] Date of Birth/Sex: [birth year] (65 y.o. M) Treating RN: Huel Coventry Primary Care Provider: Barbette Reichmann Other Clinician: [name] Referring  Provider: Barbette Reichmann Treating Provider/Extender: Rowan Blase in Treatment: 26 Constitutional patient is hypertensive.Marland Kitchen Respiratory normal breathing without difficulty. Psychiatric this patient is able to make decisions  and demonstrates good insight into disease process. Alert and Oriented x 3. pleasant and cooperative. Notes Upon inspection patient's wound is actually showing signs of being about the same it may be a little bit smaller but still does probe all the way down to bone. I think based on the fact it has been quite sometime since we done her last x-ray it would be warranted for [place] to repeat the x-ray and see what that shows. He is in agreement with that plan. Electronic Signature(s) Signed: 03/09/2022 4:50:31 PM By: Lenda Kelp PA-C Entered By: Lenda Kelp on 03/09/2022 16:50:30 Jeremy [name] ([id number]) -------------------------------------------------------------------------------- Physician Orders Details Patient Name: Jeremy [name] Date of Service: 03/09/2022 9:00 AM Medical Record Number: [id number] Patient Account Number: [id number] Date of Birth/Sex: 04/22/1956 (65 y.o. 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 Bathing/ Shower/ Hygiene o May shower; gently cleanse wound with antibacterial soap, rinse  and pat dry prior to dressing wounds o No tub bath. Edema Control - Lymphedema / Segmental Compressive Device / Other o Elevate leg(s) parallel to the floor when sitting. o DO YOUR BEST to sleep in the bed at night. DO NOT sleep in your recliner. Long hours of sitting in a recliner leads to swelling of the legs and/or potential wounds on your backside. Additional Orders / Instructions o Follow Nutritious Diet and Increase Protein Intake - monitor blood glucose to maintain normal level Culture obtained today Medications-Please add to medication list. o P.O. Antibiotics - continue Doxycycline Wound Treatment Wound #3 - Ankle Wound Laterality: Right, Lateral Cleanser: Normal Saline 1 x Per Day/30 Days Discharge Instructions: Wash your hands with soap and water. Remove old dressing, discard into plastic bag and place into trash. Cleanse the wound with Normal Saline prior to applying a clean dressing using gauze sponges, not tissues or cotton balls. Do not scrub or use excessive force. Pat dry using gauze sponges, not tissue or cotton balls. Primary Dressing: Hydrofera Blue Classic Foam Rope Dressing, 9x6 (mm/in) 1 x Per Day/30 Days Discharge Instructions: cut it a point and place into wound Secondary Dressing: Telfa Adhesive Island Dressing, 4x4 (in/in) 1 x Per Day/30 Days Discharge Instructions: Apply over dressing to secure in place. Radiology o X-ray, ankle - xray right ankle Electronic Signature(s) Signed: 03/09/2022 4:52:08 PM By: Lenda Kelp PA-C Signed: 03/12/2022 12:10:36 PM By: [name] Entered By: [name] on 03/09/2022 10:14:38 Jeremy [name] ([id number]) -------------------------------------------------------------------------------- Problem List Details Patient Name: Jeremy [name] Date of Service: 03/09/2022 9:00 AM Medical Record Number: [id number] Patient Account Number: [id number] Date of Birth/Sex: [birth year] (65 y.o. 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 without 09/07/2021 No Yes evidence of necrosis E11.622 Type 2 diabetes mellitus  with other skin ulcer 09/07/2021 No Yes I10 Essential (primary) hypertension 09/07/2021 No Yes I25.10 Atherosclerotic heart disease of native coronary artery without angina 09/07/2021 No Yes pectoris Inactive Problems Resolved Problems Electronic Signature(s) Signed: 03/09/2022 9:09:10 AM By: Lenda Kelp PA-C Entered By: Lenda Kelp on 03/09/2022 09:09:10 Jeremy [name] ([id number]) -------------------------------------------------------------------------------- Progress Note Details Patient Name: Jeremy [name] Date of Service: 03/09/2022 9:00 AM Medical Record Number: [id number] Patient Account Number: [id number] Date of Birth/Sex: [birth year] (65 y.o. M) Treating RN: Huel Coventry Primary Care Provider: Barbette Reichmann Other Clinician: [name] Referring Provider: Barbette Reichmann Treating Provider/Extender: Rowan Blase in Treatment: 26 Subjective Chief Complaint Information obtained from Patient Right ankle ulcer History of Present Illness (HPI) 08/18/2020 upon evaluation today patient presents for initial inspection here in our clinic concerning issues has been having with his right lateral ankle and this has been present for at least a year he tells me. He is a patient of Dr. Orland Jarred. Subsequently Dr. Orland Jarred has since retired hence the patient look this up and is coming here for wound care to see if we can help him out. He did have a screw pushed out of this location  and since that time tells me that the screw was removed but nonetheless the hole has remained. Fortunately there does not appear to be any signs of active infection systemically at this point though it does raise the question as to whether or not this might be a bone/hardware infection being that this has been open for so long. The patient does have a history of diabetes mellitus type 2, coronary artery disease, hypertension, and unfortunately does not seem to be doing as great as I would like to see him regard to his left ankle. 08/25/2020 upon evaluation today patient appears to be doing well with regard to his ankle ulcer compared to last week this is definitely smaller. With that being said it still does probe down to bone/hardware. I still think this can be appropriate for him to see the orthopedic specialist. He has not heard from them yet he tells me. 09/05/2020 upon evaluation today patient appears to be doing extremely well in regard to his wound all things considered. I do not see that this is gotten any worse. Unfortunately also do not think he is gotten any better. We did make a referral to orthopedics to Dr. Victorino Dike specifically but the patient tells me that he has not heard from them. That is unusual as they normally get in touch with patients fairly quickly. I will ask him if he possibly missed a phone call he tells me he will check when he gets home. Nonetheless he does not want to go to [facility] anyway which he did not tell me previously therefore we will get a see about making a referral to Dr. Logan Bores to see what he potentially could recommend for the patient as well I think Dr. Logan Bores is excellent and a good option here. Readmission: 10/28/2020 upon evaluation today patient appears to be doing about the same as when I last saw him. He did not come back for follow-up last time I saw him was 09/05/2020. Basically he tells me that he has been attempting to manage this on his own using the  Hydrofera Blue rope. He did get a call from [facility] but they did not actually get him scheduled due to the fact that the patient told me that he told them he did not wanted to see them he  just wanted to come back and see me because he was happy with my care. Nonetheless as I explained to the patient he has a fractured screw at the site of the wound he also has another screw where there appears to be some loosening of the screw and potential for hardware/bone infection here. Subsequently I think he really does need to see a specialist to see what they can do to help him out I do not believe him to be able to get this healed without taking care of the hardware issues. 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 He has been advised previous of a need for an above-knee amputation due to some of the aggressive breakdown in the ankle region. Nonetheless he has had the removal of the screw performed in office with Dr. Luana Shu and this was on 08/28/2021. Subsequently the patient is a poor historian he also gets upset very easily. Of note I do believe that he is going to require some dressings to be packed into the area due to the fact that to be honest this is still tracking all the way straight down to bone where there was pus and purulence coming out once I did get down to that area. Subsequently he tells me that he is having pain although it does seem to be doing better since the screw was removed. 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 Therefore his best options probably do entail the continue following up by Dr. Ola Spurr for infectious disease coupled with wound care as best we can and try to see if we keep this clean as much as possible and keep it  from becoming more infected. At some point there may be a time where this could continually get worse and may end up not doing nearly as well but right now he does not seem to be doing too poorly and I think this is the best way to support him. I am not overly confident that this is ever getting heal however. 09/15/21 upon evaluation today patient appears to be doing well with regard to his wound. He has been tolerating the dressing changes without complication. Fortunately I do not see any signs of active infection at this time. Overall I think that he is doing quite well. 09-22-2021 upon evaluation today patient appears to be doing about the same in regard to his ankle. 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 He again has been told that there is probably not a likely scenario in which this is able to heal. Nonetheless he is not a HBO candidate due to low ejection fraction. He does see Dr. Ola Spurr he still taking antibiotics at this point we will try to keep the area open so does not develop an abscess. 09-29-2021 upon evaluation today patient appears to be doing well with regard to his wound all things considered. He continues to use the [employer] rope. This is still a fairly deep wound that I think the rope is doing well to help keep the drainage from collecting in the base of the wound there does appear to be less drainage definitely not as purulent as what we have noticed in  the past. There is no increased pain and no signs of worsening in general. 10-06-2021 upon evaluation today patient's wound is continuing to show signs of issues here at this point. Fortunately I do not see any evidence of active infection locally or systemically which is great news but at the same time he is definitely having some ongoing problems here currently. 5/8; 2-week follow-up. Right medial ankle. Small wound purulent drainage.  He is on doxycycline chronically for underlying osteomyelitis. He has Jeremy [name], Jeremy [name] ([id number]) hardware in this area. He is using Hydrofera Blue packing strips 11-02-2021 upon evaluation today patient appears to be doing much better in regard to his wound I am very pleased to see this. I do not see any evidence of active infection locally nor systemically which is great news. No fevers, chills, nausea, vomiting, or diarrhea. 11-16-2021 upon evaluation today patient appears to be doing well with regard to his ankle ulcer this is a very tiny area remaining at this point. Fortunately I do not see any evidence of infection locally or systemically at this time. Overall I think we are on the right track. 11-30-2021 Upon inspection patient's wound bed actually showed signs of having just a very small opening that still between 1 to 2 mm max. The back end of the sterile Q-tip stick I was able to get down in here and it does still probe down to bone. With that being said he still has drainage as well. Nonetheless he has not wanted any further surgical intervention and so mainly is just more of something were doing to try to manage this as best we can do only thing really were doing a split Hydrofera Blue externally has a lot of new skin covering over where previously had a lot of open area but at the same time I am still concerned about the fact this probes all the way down to bone. 12-14-2021 upon evaluation today patient appears to be doing about the same in regard to his wound. This still actually probes down to bone. He has not seen ID anytime recently. Has been off of the doxycycline for quite some time. For that reason I really feel like we may need to see about getting something done a little bit more specific for him here and when to try to obtain a culture today so that we can see what that shows and maybe get him on antibiotics that could help this to heal faster. He is in agreement with that  plan. 12-28-2021 upon evaluation today patient appears to be doing worse in regard to his wound. Unfortunately this has sealed up and does appear to be trapping quite a bit of fluid underneath the skin the skin is loose and honestly needs to be trimmed away I discussed that with the patient today. He voiced understanding and did consent to the debridement. This is good to be done with scissors and forceps. 01-11-2022 upon evaluation today patient's wound actually is showing signs of being a little better than last time I saw him. I do believe that the doxycycline has been effective in calming this down to some degree. Fortunately I do not see any evidence of active infection at this time systemically although locally he is actually having some signs of cellulitis though this is much better compared to 2 weeks ago when I last saw him. 01-25-2022 upon evaluation today patient appears to be doing well with regard to his foot ulcer all things considered. He still has a wound  that goes deeper I think switching back to the [facility] rope is probably can to be better for him at this point. 02-08-2022 upon evaluation today patient appears to be doing well currently in regard to his wound all things considered. This is still chronic osteomyelitis issue at least from the previous evaluations. He has previously been on antibiotics for an extended period of time. Right now I have a Mannam which I started back on July 17 and that was for 2 refills he is still taking that. There was an initial 30-day supply. That should actually carry him from July 17 to October 17. With that being said I am really not sure that we are seeing a whole lot of improvement we seem to have some waxing and waning but would not ever really completely seeing this healed unfortunately. I do think the Hydrofera Blue rope is the best way to go however this still is going all the way down to bone and that has me concerned. I discussed this  with the patient before but he really does not want to take any further surgical intervention he mainly is just wanting to try let this heal its own way and he is happy with the way things are going at the moment. 02-23-2022 upon evaluation today patient appears to be doing well currently in regard to his wound all things considered. He still taking the doxycycline which I do have him on long-term in order to keep this under control. He was post to be on it by infectious disease but when that ran out I continued this as he has continued and ongoing chronic osteomyelitis of the ankle. He has been recommended amputation but he does not want to proceed with that. He also is not a HBO candidate at this point. He also tells me that he really feels like it is "doing better even though the size everything is really about the same there is no significant improvement in general here. 03-09-2022 upon evaluation today patient appears to be doing decently well in regard to his wound. This is still probing down to bone however. Again we discussed treatment options which have always included amputation and IV antibiotics but which the patient has declined in the past. Subsequently we have had him on oral antibiotics for some time and this does seem to help to a degree. With that being said I do not see any evidence of infection locally or systemically right now although he does still have some drainage this is not doing nearly as poorly as what it had been in the past. With that being said I do think that doing an updated x-ray would not be a bad idea based on where we stand currently as well. That was discussed with the patient today. Objective Constitutional patient is hypertensive.. Vitals Time Taken: 9:17 AM, Height: 71 in, Weight: 250 lbs, BMI: 34.9, Temperature: 97.5 F, Pulse: 92 bpm, Respiratory Rate: 18 breaths/min, Blood Pressure: 160/93 mmHg. Respiratory normal breathing without  difficulty. Psychiatric this patient is able to make decisions and demonstrates good insight into disease process. Alert and Oriented x 3. pleasant and cooperative. General Notes: Upon inspection patient's wound is actually showing signs of being about the same it may be a little bit smaller but still does probe Jeremy [name], Jeremy [name] ([id number]) all the way down to bone. I think based on the fact it has been quite sometime since we done her last x-ray it would be warranted for [place] to repeat the  x-ray and see what that shows. He is in agreement with that plan. Integumentary (Hair, Skin) Wound #3 status is Open. Original cause of wound was Gradually Appeared. The date acquired was: 08/24/2021. The wound has been in treatment 26 weeks. The wound is located on the Right,Lateral Ankle. The wound measures 0.5cm length x 0.4cm width x 0.7cm depth; 0.157cm^2 area and 0.11cm^3 volume. There is bone and Fat Layer (Subcutaneous Tissue) exposed. There is a medium amount of purulent drainage noted. There is medium (34-66%) pink granulation within the wound bed. There is a small (1-33%) amount of necrotic tissue within the wound bed including Adherent Slough. 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 Bathing/ Shower/ Hygiene: May shower; gently cleanse wound with antibacterial soap, rinse and pat dry prior to dressing wounds No tub bath. Edema Control - Lymphedema / Segmental Compressive Device / Other: Elevate leg(s) parallel to the floor when sitting. DO YOUR BEST to sleep in the bed at night. DO NOT sleep in your recliner. Long hours of  sitting in a recliner leads to swelling of the legs and/or potential wounds on your backside. Additional Orders / Instructions: Follow Nutritious Diet and Increase Protein Intake - monitor blood glucose to maintain normal level Culture obtained today Medications-Please add to medication list.: P.O. Antibiotics - continue Doxycycline Radiology ordered were: X-ray, ankle - xray right ankle WOUND #3: - Ankle Wound Laterality: Right, Lateral Cleanser: Normal Saline 1 x Per Day/30 Days Discharge Instructions: Wash your hands with soap and water. Remove old dressing, discard into plastic bag and place into trash. Cleanse the wound with Normal Saline prior to applying a clean dressing using gauze sponges, not tissues or cotton balls. Do not scrub or use excessive force. Pat dry using gauze sponges, not tissue or cotton balls. Primary Dressing: Hydrofera Blue Classic Foam Rope Dressing, 9x6 (mm/in) 1 x Per Day/30 Days Discharge Instructions: cut it a point and place into wound Secondary Dressing: Telfa Adhesive Island Dressing, 4x4 (in/in) 1 x Per Day/30 Days Discharge Instructions: Apply over dressing to secure in place. 1. Based on what I am seeing I would recommend that he continue with the doxycycline I had him on this for a while at this point this is kind of more of a long-term thing due to the chronic osteomyelitis. 2. I am also can recommend that we have the patient [facility] which I do believe is doing quite well as well. 3. I would also suggest that the patient should continue with the Telfa island dressing to cover. 4. I am sending him for an x-ray to further evaluate the risk of there is still some being something bone wise going on. Again previously he had a fractured screw that was removed but I think he may still have some issues going on here I am not sure if is just hardware related just bone related or combination of both. We will see patient back for  reevaluation in 2 weeks here in the clinic. If anything worsens or changes patient will contact our office for additional recommendations. Electronic Signature(s) Signed: 03/09/2022 4:51:24 PM By: Lenda KelpStone III, Veronica Guerrant PA-C Entered By: Lenda KelpStone III, Jenavi Beedle on 03/09/2022 16:51:24 Jeremy BottomROWELL, Jeremy [name] ([id number]) Tawanna SoloOWELL, Jeremy FaceMARSHALL ([id number]) --------------------------------------------------------------------------------  SuperBill Details Patient Name: Jeremy [name], Jeremy [name] Date of Service: 03/09/2022 Medical Record Number: [id number] Patient Account Number: [id number] Date of Birth/Sex: 1956-04-24 (65 y.o. 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 E11.622 Type 2 diabetes mellitus with other skin ulcer I10 Essential (primary) hypertension I25.10 Atherosclerotic heart disease of native coronary artery without angina pectoris Facility Procedures CPT4 Code: [id number] Description: 99213 - WOUND CARE VISIT-LEV 3 EST PT Modifier: Quantity: 1 Physician Procedures CPT4 Code Description: [id number] 99214 - WC PHYS LEVEL 4 - EST PT Modifier: Quantity: 1 CPT4 Code Description: ICD-10 Diagnosis Description M86.471 Chronic osteomyelitis with draining sinus, right ankle and foot L97.316 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

## 2022-03-09 NOTE — Progress Notes (Addendum)
Jeremy [name] ([id number]) Visit Report for 03/09/2022 Arrival Information Details Patient Name: Jeremy [name], Jeremy [name] Date of Service: 03/09/2022 9:00 AM Medical Record Number: [id number] Patient Account Number: [id number] Date of Birth/Sex: Jan 27, 1956 (65 y.o. M) Treating RN: Huel Coventry Primary Care Kyri Shader: Barbette Reichmann Other Clinician: [name] Referring Andruw Battie: Barbette Reichmann Treating Ananda Caya/Extender: Rowan Blase in Treatment: 26 Visit Information History Since Last Visit All ordered tests and consults were completed: No Patient Arrived: Ambulatory Added or deleted any medications: No Arrival Time: 09:16 Any new allergies or adverse reactions: No Transfer Assistance: None Had a fall or experienced change in No Patient Requires Transmission-Based No activities of daily living that may affect Precautions: risk of falls: Patient Has Alerts: Yes Hospitalized since last visit: No Patient Alerts: Patient on Blood Pain Present Now: No Thinner Eliquis Electronic Signature(s) Signed: 03/12/2022 12:10:36 PM By: [name] Entered By: [name] on 03/09/2022 09:17:09 Jeremy [name] ([id number]) -------------------------------------------------------------------------------- Clinic Level of Care Assessment Details Patient Name: Jeremy [name] Date of Service: 03/09/2022 9:00 AM Medical Record Number: [id number] Patient Account Number: [id number] Date of Birth/Sex: 03-17-1956 (65 y.o. M) Treating RN: Huel Coventry Primary Care Jessic Standifer: Barbette Reichmann Other Clinician: [name] Referring Calan Doren: Barbette Reichmann Treating Amiere Cawley/Extender: Rowan Blase in Treatment: 26 Clinic Level of Care Assessment Items TOOL 4 Quantity Score []  - Use when only an EandM is performed on FOLLOW-UP visit 0 ASSESSMENTS - Nursing Assessment / Reassessment X - Reassessment of Co-morbidities (includes updates in patient status) 1 10 X- 1 5 Reassessment  of Adherence to Treatment Plan ASSESSMENTS - Wound and Skin Assessment / Reassessment X - Simple Wound Assessment / Reassessment - one wound 1 5 []  - 0 Complex Wound Assessment / Reassessment - multiple wounds []  - 0 Dermatologic / Skin Assessment (not related to wound area) ASSESSMENTS - Focused Assessment []  - Circumferential Edema Measurements - multi extremities 0 []  - 0 Nutritional Assessment / Counseling / Intervention []  - 0 Lower Extremity Assessment (monofilament, tuning fork, pulses) []  - 0 Peripheral Arterial Disease Assessment (using hand held doppler) ASSESSMENTS - Ostomy and/or Continence Assessment and Care []  - Incontinence Assessment and Management 0 []  - 0 Ostomy Care Assessment and Management (repouching, etc.) PROCESS - Coordination of Care X - Simple Patient / Family Education for ongoing care 1 15 []  - 0 Complex (extensive) Patient / Family Education for ongoing care []  - 0 Staff obtains , Records, Test Results / Process Orders []  - 0 Staff telephones HHA, Nursing Homes / Clarify orders / etc []  - 0 Routine Transfer to another Facility (non-emergent condition) []  - 0 Routine Hospital Admission (non-emergent condition) []  - 0 New Admissions / / Ordering NPWT, Apligraf, etc. []  - 0 Emergency Hospital Admission (emergent condition) X- 1 10 Simple Discharge Coordination []  - 0 Complex (extensive) Discharge Coordination PROCESS - Special Needs []  - Pediatric / Minor Patient Management 0 []  - 0 Isolation Patient Management []  - 0 Hearing / Language / Visual special needs []  - 0 Assessment of Community assistance (transportation, D/C planning, etc.) []  - 0 Additional assistance / Altered mentation []  - 0 Support Surface(s) Assessment (bed, cushion, seat, etc.) INTERVENTIONS - Wound Cleansing / Measurement Keats, Balraj ( ) X- 1 5 Simple Wound Cleansing - one wound []  - 0 Complex Wound Cleansing -  multiple wounds X- 1 5 Wound Imaging (photographs - any number of wounds) []  - 0 Wound Tracing (instead of photographs) X- 1 5 Simple Wound Measurement - one wound []  - 0 Complex  Wound Measurement - multiple wounds INTERVENTIONS - Wound Dressings []  - Small Wound Dressing one or multiple wounds 0 X- 1 15 Medium Wound Dressing one or multiple wounds []  - 0 Large Wound Dressing one or multiple wounds []  - 0 Application of Medications - topical []  - 0 Application of Medications - injection INTERVENTIONS - Miscellaneous []  - External ear exam 0 []  - 0 Specimen Collection (cultures, biopsies, blood, body fluids, etc.) []  - 0 Specimen(s) / Culture(s) sent or taken to Lab for analysis []  - 0 Patient Transfer (multiple staff / / Similar devices) []  - 0 Simple Staple / Suture removal (25 or less) []  - 0 Complex Staple / Suture removal (26 or more) []  - 0 Hypo / Hyperglycemic Management (close monitor of Blood Glucose) []  - 0 Ankle / Brachial Index (ABI) - do not check if billed separately X- 1 5 Vital Signs Has the patient been seen at the hospital within the last three years: Yes Total Score: 80 Level Of Care: New/Established - Level 3 Electronic Signature(s) Signed: 03/12/2022 12:10:36 PM By: Entered By: on 03/09/2022 10:00:32 ( ) -------------------------------------------------------------------------------- Encounter Discharge Information Details Patient Name: Date of Service: 03/09/2022 9:00 AM Medical Record Number: Patient Account Number: Date of Birth/Sex: Jun 24, 1955 (65 y.o. M) Treating RN: 03/14/2022 Primary Care Buffy Ehler: [name] Other Clinician: [name] Referring Emberlie Gotcher: 03/11/2022 Treating Mariette Cowley/Extender: Jeremy [name] in Treatment: 26 Encounter Discharge Information Items Discharge Condition: Stable Ambulatory Status:  Ambulatory Discharge Destination: Home Transportation: Private Auto Accompanied By: self Schedule Follow-up Appointment: Yes Clinical Summary of Care: Electronic Signature(s) Signed: 03/12/2022 12:10:36 PM By: 03/11/2022 Entered By: [id number] on 03/09/2022 10:06:40 06/09/1956 (09-07-1989) -------------------------------------------------------------------------------- Lower Extremity Assessment Details Patient Name: Huel Coventry Date of Service: 03/09/2022 9:00 AM Medical Record Number: [name] Patient Account Number: Barbette Reichmann Date of Birth/Sex: Oct 24, 1955 (65 y.o. M) Treating RN: 03/14/2022 Primary Care Kosha Jaquith: [name] Other Clinician: [name] Referring Saliyah Gillin: 03/11/2022 Treating Phillip Maffei/Extender: Jeremy [name] in Treatment: 26 Electronic Signature(s) Signed: 03/09/2022 10:42:44 AM By: Jeremy [name] BSN, RN, CWS, Kim RN, BSN Signed: 03/12/2022 12:10:36 PM By: [id number] Entered By: [id number] on 03/09/2022 09:25:01 09-07-1989 (Huel Coventry) -------------------------------------------------------------------------------- Multi Wound Chart Details Patient Name: Barbette Reichmann Date of Service: 03/09/2022 9:00 AM Medical Record Number: Barbette Reichmann Patient Account Number: Rowan Blase Date of Birth/Sex: Jun 19, 1955 (65 y.o. M) Treating RN: 03/14/2022 Primary Care Cherice Glennie: [name] Other Clinician: [name] Referring Ada Holness: 03/11/2022 Treating Andrzej Scully/Extender: Jeremy [name] in Treatment: 26 Vital Signs Height(in): 71 Pulse(bpm): 92 Weight(lbs): 250 Blood Pressure(mmHg): 160/93 Body Mass Index(BMI): 34.9 Temperature(F): 97.5 Respiratory Rate(breaths/min): 18 Photos: [N/A:N/A] Wound Location: Right, Lateral Ankle N/A N/A Wounding Event: Gradually Appeared N/A N/A Primary Etiology: Refractory Osteomyelitis N/A N/A Secondary Etiology: Diabetic Wound/Ulcer of the Lower N/A  N/A Extremity Comorbid History: Arrhythmia, Congestive Heart N/A N/A Failure, Coronary Artery Disease, Hypertension, Type II Diabetes, Osteoarthritis, Osteomyelitis Date Acquired: 08/24/2021 N/A N/A Weeks of Treatment: 26 N/A N/A Wound Status: Open N/A N/A Wound Recurrence: No N/A N/A Pending Amputation on Yes N/A N/A Presentation: Measurements L x W x D (cm) 0.5x0.4x0.7 N/A N/A Area (cm) : 0.157 N/A N/A Volume (cm) : 0.11 N/A N/A % Reduction in Area: -406.50% N/A N/A % Reduction in Volume: -400.00% N/A N/A Classification: Full Thickness With Exposed N/A N/A Support Structures Exudate Amount: Medium N/A N/A Exudate Type: Purulent N/A N/A Exudate Color: yellow, brown, green N/A N/A  Granulation Amount: Medium (34-66%) N/A N/A Granulation Quality: Pink N/A N/A Necrotic Amount: Small (1-33%) N/A N/A Exposed Structures: Fat Layer (Subcutaneous Tissue): N/A N/A Yes Bone: Yes Fascia: No Tendon: No Muscle: No Joint: No Epithelialization: Small (1-33%) N/A N/A Treatment Notes Electronic MANLY, NESTLE ([id number]) Signed: 03/12/2022 12:10:36 PM By: Massie Kluver Entered By: Massie Kluver on 03/09/2022 09:25:22 [name] ([id number]) -------------------------------------------------------------------------------- Multi-Disciplinary Care Plan Details Patient Name: [name] Date of Service: 03/09/2022 9:00 AM Medical Record Number: [id number] Patient Account Number: [id number] Date of Birth/Sex: 04/11/1956 (65 y.o. M) Treating RN: Cornell Barman Primary Care Nain Rudd: Tracie Harrier Other Clinician: Massie Kluver Referring Verlin Uher: Tracie Harrier Treating Nike Southwell/Extender: Skipper Cliche in Treatment: 26 Active Inactive Osteomyelitis Nursing Diagnoses: Knowledge deficit related to disease process and management Potential for infection: osteomyelitis Goals: Patient/caregiver will verbalize understanding of disease process and disease  management Date Initiated: 09/07/2021 Target Resolution Date: 09/18/2021 Goal Status: Active Interventions: Assess for signs and symptoms of osteomyelitis resolution every visit Provide education on osteomyelitis Notes: Wound/Skin Impairment Nursing Diagnoses: Impaired tissue integrity Knowledge deficit related to smoking impact on wound healing Knowledge deficit related to ulceration/compromised skin integrity Goals: Patient will demonstrate a reduced rate of smoking or cessation of smoking Date Initiated: 09/07/2021 Target Resolution Date: 10/16/2021 Goal Status: Active Patient/caregiver will verbalize understanding of skin care regimen Date Initiated: 09/07/2021 Target Resolution Date: 09/18/2021 Goal Status: Active Ulcer/skin breakdown will have a volume reduction of 30% by week 4 Date Initiated: 09/07/2021 Target Resolution Date: 10/05/2021 Goal Status: Active Ulcer/skin breakdown will have a volume reduction of 50% by week 8 Date Initiated: 09/07/2021 Target Resolution Date: 11/02/2021 Goal Status: Active Ulcer/skin breakdown will have a volume reduction of 80% by week 12 Date Initiated: 09/07/2021 Target Resolution Date: 11/30/2021 Goal Status: Active Ulcer/skin breakdown will heal within 14 weeks Date Initiated: 09/07/2021 Target Resolution Date: 12/14/2021 Goal Status: Active Interventions: Assess patient/caregiver ability to obtain necessary supplies Assess patient/caregiver ability to perform ulcer/skin care regimen upon admission and as needed Assess ulceration(s) every visit Notes: Electronic Signature(s) SAHIL, MILNER ([id number]) Signed: 03/09/2022 10:42:44 AM By: Gretta Cool, BSN, RN, CWS, Kim RN, BSN Signed: 03/12/2022 12:10:36 PM By: Massie Kluver Entered By: Massie Kluver on 03/09/2022 09:25:05 [name] ([id number]) -------------------------------------------------------------------------------- Pain Assessment Details Patient Name: [name] Date of  Service: 03/09/2022 9:00 AM Medical Record Number: [id number] Patient Account Number: [id number] Date of Birth/Sex: 1955/11/01 (65 y.o. M) Treating RN: Cornell Barman Primary Care Sophya Vanblarcom: Tracie Harrier Other Clinician: Massie Kluver Referring Xaden Kaufman: Tracie Harrier Treating Schelly Chuba/Extender: Skipper Cliche in Treatment: 26 Active Problems Location of Pain Severity and Description of Pain Patient Has Paino No Site Locations Pain Management and Medication Current Pain Management: Electronic Signature(s) Signed: 03/09/2022 10:42:44 AM By: Gretta Cool, BSN, RN, CWS, Kim RN, BSN Signed: 03/12/2022 12:10:36 PM By: Massie Kluver Entered By: Massie Kluver on 03/09/2022 09:20:08 [name] ([id number]) -------------------------------------------------------------------------------- Patient/Caregiver Education Details Patient Name: [name] Date of Service: 03/09/2022 9:00 AM Medical Record Number: [id number] Patient Account Number: [id number] Date of Birth/Gender: September 11, 1955 (65 y.o. M) Treating RN: Cornell Barman Primary Care Physician: Tracie Harrier Other Clinician: Massie Kluver Referring Physician: Tracie Harrier Treating Physician/Extender: Skipper Cliche in Treatment: 16 Education Assessment Education Provided To: Patient Education Topics Provided Wound/Skin Impairment: Handouts: Other: continue wound care as directed Methods: Explain/Verbal Responses: State content correctly Electronic Signature(s) Signed: 03/12/2022 12:10:36 PM By: Massie Kluver Entered By: Massie Kluver on 03/09/2022 10:06:03 [name] ([id number]) -------------------------------------------------------------------------------- Wound Assessment Details Patient Name: [name] Date of Service: 03/09/2022  9:00 AM Medical Record Number: [id number] Patient Account Number: [id number] Date of Birth/Sex: 13-Feb-1956 (65 y.o. M) Treating RN: Huel Coventry Primary Care  Patryck Kilgore: Barbette Reichmann Other Clinician: [name] Referring Montarius Kitagawa: Barbette Reichmann Treating Loura Pitt/Extender: Rowan Blase in Treatment: 26 Wound Status Wound Number: 3 Primary Refractory Osteomyelitis Etiology: Wound Location: Right, Lateral Ankle Secondary Diabetic Wound/Ulcer of the Lower Extremity Wounding Event: Gradually Appeared Etiology: Date Acquired: 08/24/2021 Wound Open Weeks Of Treatment: 26 Status: Clustered Wound: No Comorbid Arrhythmia, Congestive Heart Failure, Coronary Artery Pending Amputation On Presentation History: Disease, Hypertension, Type II Diabetes, Osteoarthritis, Osteomyelitis Photos Wound Measurements Length: (cm) 0.5 Width: (cm) 0.4 Depth: (cm) 0.7 Area: (cm) 0.157 Volume: (cm) 0.11 % Reduction in Area: -406.5% % Reduction in Volume: -400% Epithelialization: Small (1-33%) Wound Description Classification: Full Thickness With Exposed Support Structures Exudate Amount: Medium Exudate Type: Purulent Exudate Color: yellow, brown, green Foul Odor After Cleansing: No Slough/Fibrino Yes Wound Bed Granulation Amount: Medium (34-66%) Exposed Structure Granulation Quality: Pink Fascia Exposed: No Necrotic Amount: Small (1-33%) Fat Layer (Subcutaneous Tissue) Exposed: Yes Necrotic Quality: Adherent Slough Tendon Exposed: No Muscle Exposed: No Joint Exposed: No Bone Exposed: Yes Treatment Notes Wound #3 (Ankle) Wound Laterality: Right, Lateral Cleanser Normal Saline Discharge Instruction: Wash your hands with soap and water. Remove old dressing, discard into plastic bag and place into trash. Cleanse the wound with Normal Saline prior to applying a clean dressing using gauze sponges, not tissues or cotton balls. Do not Kasal, Britton ([id number]) scrub or use excessive force. Pat dry using gauze sponges, not tissue or cotton balls. Peri-Wound Care Topical Primary Dressing Hydrofera Blue Classic Foam Rope Dressing,  9x6 (mm/in) Discharge Instruction: cut it a point and place into wound Secondary Dressing Telfa Adhesive Island Dressing, 4x4 (in/in) Discharge Instruction: Apply over dressing to secure in place. Secured With Compression Wrap Compression Stockings Facilities manager) Signed: 03/09/2022 10:42:44 AM By: Elliot Gurney, BSN, RN, CWS, Kim RN, BSN Signed: 03/12/2022 12:10:36 PM By: [name] Entered By: [name] on 03/09/2022 09:24:53 Jeremy [name] ([id number]) -------------------------------------------------------------------------------- Vitals Details Patient Name: Jeremy [name] Date of Service: 03/09/2022 9:00 AM Medical Record Number: [id number] Patient Account Number: [id number] Date of Birth/Sex: 1955/07/13 (65 y.o. M) Treating RN: Huel Coventry Primary Care Makenlee Mckeag: Barbette Reichmann Other Clinician: [name] Referring Rynn Markiewicz: Barbette Reichmann Treating Yudit Modesitt/Extender: Rowan Blase in Treatment: 26 Vital Signs Time Taken: 09:17 Temperature (F): 97.5 Height (in): 71 Pulse (bpm): 92 Weight (lbs): 250 Respiratory Rate (breaths/min): 18 Body Mass Index (BMI): 34.9 Blood Pressure (mmHg): 160/93 Reference Range: 80 - 120 mg / dl Electronic Signature(s) Signed: 03/12/2022 12:10:36 PM By: [name] Entered By: [name] on 03/09/2022 09:20:00

## 2022-03-12 NOTE — Progress Notes (Signed)
Jeremy, [name] ([id number]) Visit Report for 11/02/2021 Arrival Information Details Patient Name: Jeremy [name], POYSER Date of Service: 11/02/2021 9:45 AM Medical Record Number: [id number] Patient Account Number: [id number] Date of Birth/Sex: 1955-12-03 (65 y.o. M) Treating RN: Huel Coventry Primary Care Juris Gosnell: Jeremy [name] Other Clinician: Referring Rhema Boyett: Jeremy [name] Treating Jeremy [name]/Extender: Jeremy [name] in Treatment: 8 Visit Information History Since Last Visit Added or deleted any medications: No Patient Arrived: Ambulatory Any new allergies or adverse reactions: No Arrival Time: 09:40 Hospitalized since last visit: No Transfer Assistance: None Pain Present Now: No Patient Requires Transmission-Based No Precautions: Patient Has Alerts: Yes Patient Alerts: Patient on Blood Thinner Eliquis Electronic Signature(s) Signed: 03/12/2022 12:24:53 PM By: [name] Entered By: [name] on 11/02/2021 09:50:19 Jeremy [name] ([id number]) -------------------------------------------------------------------------------- Clinic Level of Care Assessment Details Patient Name: Jeremy [name] Date of Service: 11/02/2021 9:45 AM Medical Record Number: [id number] Patient Account Number: [id number] Date of Birth/Sex: 1955-10-23 (65 y.o. M) Treating RN: Huel Coventry Primary Care Jeremy [name]: Jeremy [name] Other Clinician: Referring Jeremy [name]: Jeremy [name] Treating Maisen Schmit/Extender: Jeremy [name] in Treatment: 8 Clinic Level of Care Assessment Items TOOL 4 Quantity Score []  - Use when only an EandM is performed on FOLLOW-UP visit 0 ASSESSMENTS - Nursing Assessment / Reassessment X - Reassessment of Co-morbidities (includes updates in patient status) 1 10 X- 1 5 Reassessment of Adherence to Treatment Plan ASSESSMENTS - Wound and Skin Assessment / Reassessment X - Simple Wound Assessment / Reassessment - one wound 1 5 []  - 0 Complex Wound Assessment /  Reassessment - multiple wounds []  - 0 Dermatologic / Skin Assessment (not related to wound area) ASSESSMENTS - Focused Assessment []  - Circumferential Edema Measurements - multi extremities 0 []  - 0 Nutritional Assessment / Counseling / Intervention []  - 0 Lower Extremity Assessment (monofilament, tuning fork, pulses) []  - 0 Peripheral Arterial Disease Assessment (using hand held doppler) ASSESSMENTS - Ostomy and/or Continence Assessment and Care []  - Incontinence Assessment and Management 0 []  - 0 Ostomy Care Assessment and Management (repouching, etc.) PROCESS - Coordination of Care X - Simple Patient / Family Education for ongoing care 1 15 []  - 0 Complex (extensive) Patient / Family Education for ongoing care X- 1 10 Staff obtains Consents, Records, Test Results / Process Orders []  - 0 Staff telephones HHA, Nursing Homes / Clarify orders / etc []  - 0 Routine Transfer to another Facility (non-emergent condition) []  - 0 Routine Hospital Admission (non-emergent condition) []  - 0 New Admissions / / Ordering NPWT, Apligraf, etc. []  - 0 Emergency Hospital Admission (emergent condition) X- 1 10 Simple Discharge Coordination []  - 0 Complex (extensive) Discharge Coordination PROCESS - Special Needs []  - Pediatric / Minor Patient Management 0 []  - 0 Isolation Patient Management []  - 0 Hearing / Language / Visual special needs []  - 0 Assessment of Community assistance (transportation, D/C planning, etc.) []  - 0 Additional assistance / Altered mentation []  - 0 Support Surface(s) Assessment (bed, cushion, seat, etc.) INTERVENTIONS - Wound Cleansing / Measurement [name], Jeremy ( ) X- 1 5 Simple Wound Cleansing - one wound []  - 0 Complex Wound Cleansing - multiple wounds X- 1 5 Wound Imaging (photographs - any number of wounds) []  - 0 Wound Tracing (instead of photographs) X- 1 5 Simple Wound Measurement - one wound []  - 0 Complex  Wound Measurement - multiple wounds INTERVENTIONS - Wound Dressings []  - Small Wound Dressing one or multiple wounds 0 X- 1 15 Medium Wound Dressing one or multiple wounds []  -  0 Large Wound Dressing one or multiple wounds []  - 0 Application of Medications - topical []  - 0 Application of Medications - injection INTERVENTIONS - Miscellaneous []  - External ear exam 0 []  - 0 Specimen Collection (cultures, biopsies, blood, body fluids, etc.) []  - 0 Specimen(s) / Culture(s) sent or taken to Lab for analysis []  - 0 Patient Transfer (multiple staff / / Similar devices) []  - 0 Simple Staple / Suture removal (25 or less) []  - 0 Complex Staple / Suture removal (26 or more) []  - 0 Hypo / Hyperglycemic Management (close monitor of Blood Glucose) []  - 0 Ankle / Brachial Index (ABI) - do not check if billed separately X- 1 5 Vital Signs Has the patient been seen at the hospital within the last three years: Yes Total Score: 90 Level Of Care: New/Established - Level 3 Electronic Signature(s) Signed: 03/12/2022 12:24:53 PM By: Entered By: on 11/02/2021 10:09:38 (Nurse, adult) -------------------------------------------------------------------------------- Encounter Discharge Information Details Patient Name: Date of Service: 11/02/2021 9:45 AM Medical Record Number: Patient Account Number: Date of Birth/Sex: [birth year] (65 y.o. M) Treating RN: [name] Primary Care Jeremy [name]: 11/04/2021 Other Clinician: Referring Jeremy [name]: Jeremy [name] Treating Jeremy [name]/Extender: [id number] in Treatment: 8 Encounter Discharge Information Items Discharge Condition: Stable Ambulatory Status: Ambulatory Discharge Destination: Home Transportation: Private Auto Accompanied By: self Schedule Follow-up Appointment: Yes Clinical Summary of Care: Electronic Signature(s) Signed: 03/12/2022  12:24:53 PM By: 11/04/2021 Entered By: [id number] on 11/02/2021 10:12:40 06/09/1956 (09-07-1989) -------------------------------------------------------------------------------- Lower Extremity Assessment Details Patient Name: Huel Coventry Date of Service: 11/02/2021 9:45 AM Medical Record Number: Jeremy [name] Patient Account Number: Jeremy [name] Date of Birth/Sex: 1955/08/30 (65 y.o. M) Treating RN: [name] Primary Care Aleksandar Duve: 11/04/2021 Other Clinician: Referring Mando Blatz: Jeremy [name] Treating Nakiea Metzner/Extender: [id number] in Treatment: 8 Vascular Assessment Pulses: Dorsalis Pedis Palpable: [Right:Yes] Electronic Signature(s) Signed: 11/02/2021 4:42:58 PM By: 11/04/2021, BSN, RN, CWS, Kim RN, BSN Signed: 03/12/2022 12:24:53 PM By: [id number] Entered By: 06/09/1956 on 11/02/2021 10:01:53 Huel Coventry (Jeremy [name]) -------------------------------------------------------------------------------- Multi Wound Chart Details Patient Name: Jeremy [name] Date of Service: 11/02/2021 9:45 AM Medical Record Number: 11/04/2021 Patient Account Number: Elliot Gurney Date of Birth/Sex: 10-Oct-1955 (65 y.o. M) Treating RN: [name] Primary Care Polly Barner: 11/04/2021 Other Clinician: Referring Urho Rio: Jeremy [name] Treating Zareena Willis/Extender: [id number] in Treatment: 8 Vital Signs Height(in): 71 Pulse(bpm): 72 Weight(lbs): 250 Blood Pressure(mmHg): 131/88 Body Mass Index(BMI): 34.9 Temperature(F): 98.3 Respiratory Rate(breaths/min): 18 Photos: [N/A:N/A] Wound Location: Right, Lateral Ankle N/A N/A Wounding Event: Gradually Appeared N/A N/A Primary Etiology: Refractory Osteomyelitis N/A N/A Secondary Etiology: Diabetic Wound/Ulcer of the Lower N/A N/A Extremity Comorbid History: Arrhythmia, Congestive Heart N/A N/A Failure, Coronary Artery Disease, Hypertension, Type II Diabetes, Osteoarthritis, Osteomyelitis Date  Acquired: 08/24/2021 N/A N/A Weeks of Treatment: 8 N/A N/A Wound Status: Open N/A N/A Wound Recurrence: No N/A N/A Pending Amputation on Yes N/A N/A Presentation: Measurements L x W x D (cm) 0.7x0.5x0.5 N/A N/A Area (cm) : 0.275 N/A N/A Volume (cm) : 0.137 N/A N/A % Reduction in Area: -787.10% N/A N/A % Reduction in Volume: -522.70% N/A N/A Classification: Full Thickness With Exposed N/A N/A Support Structures Exudate Amount: Large N/A N/A Exudate Type: Serous N/A N/A Exudate Color: amber N/A N/A Granulation Amount: Medium (34-66%) N/A N/A Granulation Quality: Pink N/A N/A Necrotic Amount: Small (1-33%) N/A N/A Exposed Structures: Fat Layer (Subcutaneous Tissue): N/A N/A Yes Bone: Yes Fascia: No Tendon: No  Muscle: No Joint: No Epithelialization: None N/A N/A Treatment Notes Electronic STOKES, RATTIGAN ([id number]) Signed: 03/12/2022 12:24:53 PM By: [name] Entered By: [name] on 11/02/2021 10:02:13 Jeremy [name] ([id number]) -------------------------------------------------------------------------------- Multi-Disciplinary Care Plan Details Patient Name: Jeremy [name] Date of Service: 11/02/2021 9:45 AM Medical Record Number: [id number] Patient Account Number: [id number] Date of Birth/Sex: 09/16/1955 (65 y.o. M) Treating RN: Huel Coventry Primary Care Andrik Sandt: Jeremy [name] Other Clinician: Referring Marquia Costello: Jeremy [name] Treating Kieran Nachtigal/Extender: Jeremy [name] in Treatment: 8 Active Inactive Osteomyelitis Nursing Diagnoses: Knowledge deficit related to disease process and management Potential for infection: osteomyelitis Goals: Patient/caregiver will verbalize understanding of disease process and disease management Date Initiated: 09/07/2021 Target Resolution Date: 09/18/2021 Goal Status: Active Interventions: Assess for signs and symptoms of osteomyelitis resolution every visit Provide education on  osteomyelitis Notes: Wound/Skin Impairment Nursing Diagnoses: Impaired tissue integrity Knowledge deficit related to smoking impact on wound healing Knowledge deficit related to ulceration/compromised skin integrity Goals: Patient will demonstrate a reduced rate of smoking or cessation of smoking Date Initiated: 09/07/2021 Target Resolution Date: 10/16/2021 Goal Status: Active Patient/caregiver will verbalize understanding of skin care regimen Date Initiated: 09/07/2021 Target Resolution Date: 09/18/2021 Goal Status: Active Ulcer/skin breakdown will have a volume reduction of 30% by week 4 Date Initiated: 09/07/2021 Target Resolution Date: 10/05/2021 Goal Status: Active Ulcer/skin breakdown will have a volume reduction of 50% by week 8 Date Initiated: 09/07/2021 Target Resolution Date: 11/02/2021 Goal Status: Active Ulcer/skin breakdown will have a volume reduction of 80% by week 12 Date Initiated: 09/07/2021 Target Resolution Date: 11/30/2021 Goal Status: Active Ulcer/skin breakdown will heal within 14 weeks Date Initiated: 09/07/2021 Target Resolution Date: 12/14/2021 Goal Status: Active Interventions: Assess patient/caregiver ability to obtain necessary supplies Assess patient/caregiver ability to perform ulcer/skin care regimen upon admission and as needed Assess ulceration(s) every visit Notes: Electronic Signature(s) JAVARRI, SEGAL ([id number]) Signed: 11/02/2021 4:42:58 PM By: Elliot Gurney, BSN, RN, CWS, Kim RN, BSN Signed: 03/12/2022 12:24:53 PM By: [name] Entered By: [name] on 11/02/2021 10:02:03 Jeremy [name] ([id number]) -------------------------------------------------------------------------------- Pain Assessment Details Patient Name: Jeremy [name] Date of Service: 11/02/2021 9:45 AM Medical Record Number: [id number] Patient Account Number: [id number] Date of Birth/Sex: [birth year] (65 y.o. M) Treating RN: Huel Coventry Primary Care Alistair Senft: Jeremy [name]  Other Clinician: Referring Luv Mish: Jeremy [name] Treating Rusty Glodowski/Extender: Jeremy [name] in Treatment: 8 Active Problems Location of Pain Severity and Description of Pain Patient Has Paino No Site Locations Pain Management and Medication Current Pain Management: Electronic Signature(s) Signed: 11/02/2021 4:42:58 PM By: Elliot Gurney, BSN, RN, CWS, Kim RN, BSN Signed: 03/12/2022 12:24:53 PM By: [name] Entered By: [name] on 11/02/2021 09:51:42 Jeremy [name] ([id number]) -------------------------------------------------------------------------------- Patient/Caregiver Education Details Patient Name: Jeremy [name] Date of Service: 11/02/2021 9:45 AM Medical Record Number: [id number] Patient Account Number: [id number] Date of Birth/Gender: Oct 22, 1955 (65 y.o. M) Treating RN: Huel Coventry Primary Care Physician: Jeremy [name] Other Clinician: Referring Physician: Barbette [name] Treating Physician/Extender: Jeremy [name] in Treatment: 8 Education Assessment Education Provided To: Patient Education Topics Provided Infection: Handouts: Other: continue antibiotics Responses: State content correctly Wound/Skin Impairment: Handouts: Other: continue wound care as prescribed Methods: Explain/Verbal Responses: State content correctly Electronic Signature(s) Signed: 03/12/2022 12:24:53 PM By: [name] Entered By: [name] on 11/02/2021 10:11:30 Jeremy [name] ([id number]) -------------------------------------------------------------------------------- Wound Assessment Details Patient Name: Jeremy [name] Date of Service: 11/02/2021 9:45 AM Medical Record Number: [id number] Patient Account Number: [id number] Date of Birth/Sex: Jun 26, 1955 (65 y.o. M) Treating RN: Huel Coventry Primary Care Sovereign Ramiro: Jeremy [name] Other Clinician: Referring Mikhia Dusek:  Hande, Vishwanath Treating Waunita Sandstrom/Extender: Jeri Cos Weeks in Treatment: 8 Wound  Status Wound Number: 3 Primary Refractory Osteomyelitis Etiology: Wound Location: Right, Lateral Ankle Secondary Diabetic Wound/Ulcer of the Lower Extremity Wounding Event: Gradually Appeared Etiology: Date Acquired: 08/24/2021 Wound Open Weeks Of Treatment: 8 Status: Clustered Wound: No Comorbid Arrhythmia, Congestive Heart Failure, Coronary Artery Pending Amputation On Presentation History: Disease, Hypertension, Type II Diabetes, Osteoarthritis, Osteomyelitis Photos Wound Measurements Length: (cm) 0.7 Width: (cm) 0.5 Depth: (cm) 0.5 Area: (cm) 0.275 Volume: (cm) 0.137 % Reduction in Area: -787.1% % Reduction in Volume: -522.7% Epithelialization: None Wound Description Classification: Full Thickness With Exposed Support Structures Exudate Amount: Large Exudate Type: Serous Exudate Color: amber Foul Odor After Cleansing: No Slough/Fibrino Yes Wound Bed Granulation Amount: Medium (34-66%) Exposed Structure Granulation Quality: Pink Fascia Exposed: No Necrotic Amount: Small (1-33%) Fat Layer (Subcutaneous Tissue) Exposed: Yes Tendon Exposed: No Muscle Exposed: No Joint Exposed: No Bone Exposed: Yes Electronic Signature(s) Signed: 11/02/2021 4:42:58 PM By: Gretta Cool, BSN, RN, CWS, Kim RN, BSN Signed: 03/12/2022 12:24:53 PM By: Massie Kluver Entered By: Massie Kluver on 11/02/2021 10:00:25 [name] ([id number]) -------------------------------------------------------------------------------- [place] Details Patient Name: [name] Date of Service: 11/02/2021 9:45 AM Medical Record Number: [id number] Patient Account Number: [id number] Date of Birth/Sex: July 12, 1955 (65 y.o. M) Treating RN: Cornell Barman Primary Care Mykal Batiz: Tracie Harrier Other Clinician: Referring Jayliana Valencia: Tracie Harrier Treating Irelynd [name]/Extender: Skipper Cliche in Treatment: 8 Vital Signs Time Taken: 09:50 Temperature (F): 98.3 Height (in): 71 Pulse (bpm): 72 Weight  (lbs): 250 Respiratory Rate (breaths/min): 18 Body Mass Index (BMI): 34.9 Blood Pressure (mmHg): 131/88 Reference Range: 80 - 120 mg / dl Electronic Signature(s) Signed: 03/12/2022 12:24:53 PM By: Massie Kluver Entered By: Massie Kluver on 11/02/2021 09:51:23

## 2022-03-23 ENCOUNTER — Encounter: Payer: Medicaid Other | Attending: Physician Assistant | Admitting: Physician Assistant

## 2022-03-23 DIAGNOSIS — E11621 Type 2 diabetes mellitus with foot ulcer: Secondary | ICD-10-CM | POA: Insufficient documentation

## 2022-03-23 DIAGNOSIS — M86471 Chronic osteomyelitis with draining sinus, right ankle and foot: Secondary | ICD-10-CM | POA: Insufficient documentation

## 2022-03-23 DIAGNOSIS — L97316 Non-pressure chronic ulcer of right ankle with bone involvement without evidence of necrosis: Secondary | ICD-10-CM | POA: Insufficient documentation

## 2022-03-23 DIAGNOSIS — I2510 Atherosclerotic heart disease of native coronary artery without angina pectoris: Secondary | ICD-10-CM | POA: Insufficient documentation

## 2022-03-23 DIAGNOSIS — I10 Essential (primary) hypertension: Secondary | ICD-10-CM | POA: Insufficient documentation

## 2022-03-23 DIAGNOSIS — E1169 Type 2 diabetes mellitus with other specified complication: Secondary | ICD-10-CM | POA: Diagnosis present

## 2022-03-24 NOTE — Progress Notes (Signed)
BRET, KLOCKO ([id number]) Visit Report for 03/23/2022 Chief Complaint Document Details Patient Name: Jeremy, [name] Date of Service: 03/23/2022 9:45 AM Medical Record Number: [id number] Patient Account Number: [id number] Date of Birth/Sex: 07-09-1955 (66 y.o. M) Treating RN: Carlene Coria Primary Care Provider: Tracie Harrier Other Clinician: Massie Kluver Referring Provider: Tracie Harrier Treating Provider/Extender: Skipper Cliche in Treatment: 28 Information Obtained from: Patient Chief Complaint Right ankle ulcer Electronic Signature(s) Signed: 03/23/2022 10:25:33 AM By: Worthy Keeler PA-C Entered By: Worthy Keeler on 03/23/2022 10:25:33 Jeremy [name] ([id number]) -------------------------------------------------------------------------------- HPI Details Patient Name: Jeremy [name] Date of Service: 03/23/2022 9:45 AM Medical Record Number: [id number] Patient Account Number: [id number] Date of Birth/Sex: 06-17-1955 (66 y.o. M) Treating RN: Carlene Coria Primary Care Provider: Tracie Harrier Other Clinician: Massie Kluver Referring Provider: Tracie Harrier Treating Provider/Extender: Skipper Cliche in Treatment: 28 History of Present Illness HPI Description: 08/18/2020 upon evaluation today patient presents for initial inspection here in our clinic concerning issues has been having with his right lateral ankle and this has been present for at least a year he tells me. He is a patient of Dr. Elvina Mattes. Subsequently Dr. Elvina Mattes has since retired hence the patient look this up and is coming here for wound care to see if we can help him out. He did have a screw pushed out of this location and since that time tells me that the screw was removed but nonetheless the hole has remained. Fortunately there does not appear to be any signs of active infection systemically at this point though it does raise the question as to whether or not this might be a  bone/hardware infection being that this has been open for so long. The patient does have a history of diabetes mellitus type 2, coronary artery disease, hypertension, and unfortunately does not seem to be doing as great as I would like to see him regard to his left ankle. 08/25/2020 upon evaluation today patient appears to be doing well with regard to his ankle ulcer compared to last week this is definitely smaller. With that being said it still does probe down to bone/hardware. I still think this can be appropriate for him to see the orthopedic specialist. He has not heard from them yet he tells me. 09/05/2020 upon evaluation today patient appears to be doing extremely well in regard to his wound all things considered. I do not see that this is gotten any worse. Unfortunately also do not think he is gotten any better. We did make a referral to orthopedics to Dr. Doran Durand specifically but the patient tells me that he has not heard from them. That is unusual as they normally get in touch with patients fairly quickly. I will ask him if he possibly missed a phone call he tells me he will check when he gets home. Nonetheless he does not want to go to [facility] anyway which he did not tell me previously therefore we will get a see about making a referral to Dr. Amalia Hailey to see what he potentially could recommend for the patient as well I think Dr. Amalia Hailey is excellent and a good option here. Readmission: 10/28/2020 upon evaluation today patient appears to be doing about the same as when I last saw him. He did not come back for follow-up last time I saw him was 09/05/2020. Basically he tells me that he has been attempting to manage this on his own using the Hydrofera Blue rope. He did get a call from [facility] but they  did not actually get him scheduled due to the fact that the patient told me that he told them he did not wanted to see them he just wanted to come back and see me because he was happy with my  care. Nonetheless as I explained to the patient he has a fractured screw at the site of the wound he also has another screw where there appears to be some loosening of the screw and potential for hardware/bone infection here. Subsequently I think he really does need to see a specialist to see what they can do to help him out I do not believe him to be able to get this healed without taking care of the hardware issues. 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 He has been advised previous of a need for an above-knee amputation due to some of the aggressive breakdown in the ankle region. Nonetheless he has had the removal of the screw performed in office with Dr. Luana Shu and this was on 08/28/2021. Subsequently the patient is a poor historian he also gets upset very easily. Of note I do believe that he is going to require some dressings to be packed into the area due to the fact that to be honest this is still tracking all the way straight down to bone where there was pus and purulence coming out once I did get down to that area. Subsequently he tells me that he is having pain although it does seem to be doing better since the screw was removed. 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 Therefore his best options probably do entail the continue following up by Dr. Ola Spurr for infectious disease coupled with wound care as best we can and try to see if we keep this clean as much as possible and keep it from becoming more infected. At some point there may be a time where this could continually get worse and may end up not doing nearly as well but right now he does not seem to be doing too poorly and I think this is the best way to support him. I am not overly  confident that this is ever getting heal however. 09/15/21 upon evaluation today patient appears to be doing well with regard to his wound. He has been tolerating the dressing changes without complication. Fortunately I do not see any signs of active infection at this time. Overall I think that he is doing quite well. 09-22-2021 upon evaluation today patient appears to be doing about the same in regard to his ankle. 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 He again has been told that there is probably not a likely scenario in which this is able to heal. Nonetheless he is not a HBO candidate due to low ejection fraction. He does see Dr. Ola Spurr he still taking antibiotics at this point we will try to keep the area open so does not develop an abscess. 09-29-2021 upon evaluation today patient appears to be doing well with regard to his wound all things considered. He continues to use the [employer] rope. This is still a fairly deep wound that I think the rope is doing well to help  keep the drainage from collecting in the base of the wound there does appear to be less drainage definitely not as purulent as what we have noticed in the past. There is no increased pain and no signs of worsening in general. 10-06-2021 upon evaluation today patient's wound is continuing to show signs of issues here at this point. Fortunately I do not see any evidence of active infection locally or systemically which is great news but at the same time he is definitely having some ongoing problems here currently. 5/8; 2-week follow-up. Right medial ankle. Small wound purulent drainage. He is on doxycycline chronically for underlying osteomyelitis. He has hardware in this area. He is using Hydrofera Blue packing strips 11-02-2021 upon evaluation today patient appears to be doing much better in regard to his wound I am very pleased to see this.  I do not see any evidence of active infection locally nor systemically which is great news. No fevers, chills, nausea, vomiting, or diarrhea. BREVON, DEWALD ([id number]) 11-16-2021 upon evaluation today patient appears to be doing well with regard to his ankle ulcer this is a very tiny area remaining at this point. Fortunately I do not see any evidence of infection locally or systemically at this time. Overall I think we are on the right track. 11-30-2021 Upon inspection patient's wound bed actually showed signs of having just a very small opening that still between 1 to 2 mm max. The back end of the sterile Q-tip stick I was able to get down in here and it does still probe down to bone. With that being said he still has drainage as well. Nonetheless he has not wanted any further surgical intervention and so mainly is just more of something were doing to try to manage this as best we can do only thing really were doing a split Hydrofera Blue externally has a lot of new skin covering over where previously had a lot of open area but at the same time I am still concerned about the fact this probes all the way down to bone. 12-14-2021 upon evaluation today patient appears to be doing about the same in regard to his wound. This still actually probes down to bone. He has not seen ID anytime recently. Has been off of the doxycycline for quite some time. For that reason I really feel like we may need to see about getting something done a little bit more specific for him here and when to try to obtain a culture today so that we can see what that shows and maybe get him on antibiotics that could help this to heal faster. He is in agreement with that plan. 12-28-2021 upon evaluation today patient appears to be doing worse in regard to his wound. Unfortunately this has sealed up and does appear to be trapping quite a bit of fluid underneath the skin the skin is loose and honestly needs to be trimmed away I discussed  that with the patient today. He voiced understanding and did consent to the debridement. This is good to be done with scissors and forceps. 01-11-2022 upon evaluation today patient's wound actually is showing signs of being a little better than last time I saw him. I do believe that the doxycycline has been effective in calming this down to some degree. Fortunately I do not see any evidence of active infection at this time systemically although locally he is actually having some signs of cellulitis though this is much better compared to 2 weeks ago when  I last saw him. 01-25-2022 upon evaluation today patient appears to be doing well with regard to his foot ulcer all things considered. He still has a wound that goes deeper I think switching back to the [facility] rope is probably can to be better for him at this point. 02-08-2022 upon evaluation today patient appears to be doing well currently in regard to his wound all things considered. This is still chronic osteomyelitis issue at least from the previous evaluations. He has previously been on antibiotics for an extended period of time. Right now I have a Mannam which I started back on July 17 and that was for 2 refills he is still taking that. There was an initial 30-day supply. That should actually carry him from July 17 to October 17. With that being said I am really not sure that we are seeing a whole lot of improvement we seem to have some waxing and waning but would not ever really completely seeing this healed unfortunately. I do think the Hydrofera Blue rope is the best way to go however this still is going all the way down to bone and that has me concerned. I discussed this with the patient before but he really does not want to take any further surgical intervention he mainly is just wanting to try let this heal its own way and he is happy with the way things are going at the moment. 02-23-2022 upon evaluation today patient appears to be  doing well currently in regard to his wound all things considered. He still taking the doxycycline which I do have him on long-term in order to keep this under control. He was post to be on it by infectious disease but when that ran out I continued this as he has continued and ongoing chronic osteomyelitis of the ankle. He has been recommended amputation but he does not want to proceed with that. He also is not a HBO candidate at this point. He also tells me that he really feels like it is "doing better even though the size everything is really about the same there is no significant improvement in general here. 03-09-2022 upon evaluation today patient appears to be doing decently well in regard to his wound. This is still probing down to bone however. Again we discussed treatment options which have always included amputation and IV antibiotics but which the patient has declined in the past. Subsequently we have had him on oral antibiotics for some time and this does seem to help to a degree. With that being said I do not see any evidence of infection locally or systemically right now although he does still have some drainage this is not doing nearly as poorly as what it had been in the past. With that being said I do think that doing an updated x-ray would not be a bad idea based on where we stand currently as well. That was discussed with the patient today. 03-23-2022 upon evaluation today patient's wound actually is showing signs of doing about the same in regard to the ankle region. Fortunately there does not appear to be any signs of active infection locally nor systemically at this time which is great news. With that being said I did review his x-rays and in fact reviewed the actual images as well. What I found was that I am concerned about the hardware loosening and to be honest I think that this is something that he probably needs to have either revision or removal of  the hardware in general in  order to try to allow this wound to heal with the hardware there and I feel like going to continue to have issues to be perfectly honest. I discussed that with him today and I would like for him to see Dr. Merril Abbe who is a local podiatrist whom I trust to see what he suggest about any possibility of infection from the hardware and whether or not the hardware loosening could be potentially a cause here and something that could be fixed with intervention. Electronic Signature(s) Signed: 03/23/2022 10:26:33 AM By: Worthy Keeler PA-C Entered By: Worthy Keeler on 03/23/2022 10:26:33 JAGO, BUFFKIN ([id number]) -------------------------------------------------------------------------------- Physical Exam Details Patient Name: Jeremy [name] Date of Service: 03/23/2022 9:45 AM Medical Record Number: [id number] Patient Account Number: [id number] Date of Birth/Sex: 05-05-1956 (65 y.o. M) Treating RN: Carlene Coria Primary Care Provider: Tracie Harrier Other Clinician: Massie Kluver Referring Provider: Tracie Harrier Treating Provider/Extender: Skipper Cliche in Treatment: 22 Constitutional Well-nourished and well-hydrated in no acute distress. Respiratory normal breathing without difficulty. Psychiatric this patient is able to make decisions and demonstrates good insight into disease process. Alert and Oriented x 3. pleasant and cooperative. Notes Upon inspection patient's wound actually appears to be doing about the same. I do not see any signs of worsening although also do not see any signs of significant improvement either. Electronic Signature(s) Signed: 03/23/2022 10:27:01 AM By: Worthy Keeler PA-C Entered By: Worthy Keeler on 03/23/2022 10:27:01 Jeremy [name] ([id number]) -------------------------------------------------------------------------------- Physician Orders Details Patient Name: Jeremy [name] Date of Service: 03/23/2022 9:45 AM Medical Record  Number: [id number] Patient Account Number: [id number] Date of Birth/Sex: 1956-05-15 (65 y.o. M) Treating RN: Carlene Coria Primary Care Provider: Tracie Harrier Other Clinician: Massie Kluver Referring Provider: Tracie Harrier Treating Provider/Extender: Skipper Cliche in Treatment: 50 Verbal / Phone Orders: No Diagnosis Coding Follow-up Appointments o Return Appointment in 2 weeks. Bathing/ Shower/ Hygiene o May shower; gently cleanse wound with antibacterial soap, rinse and pat dry prior to dressing wounds o No tub bath. Edema Control - Lymphedema / Segmental Compressive Device / Other o Elevate leg(s) parallel to the floor when sitting. o DO YOUR BEST to sleep in the bed at night. DO NOT sleep in your recliner. Long hours of sitting in a recliner leads to swelling of the legs and/or potential wounds on your backside. Additional Orders / Instructions o Follow Nutritious Diet and Increase Protein Intake - monitor blood glucose to maintain normal level Culture obtained today Medications-Please add to medication list. o P.O. Antibiotics - continue Doxycycline Wound Treatment Wound #3 - Ankle Wound Laterality: Right, Lateral Cleanser: Normal Saline 1 x Per Day/30 Days Discharge Instructions: Wash your hands with soap and water. Remove old dressing, discard into plastic bag and place into trash. Cleanse the wound with Normal Saline prior to applying a clean dressing using gauze sponges, not tissues or cotton balls. Do not scrub or use excessive force. Pat dry using gauze sponges, not tissue or cotton balls. Primary Dressing: Hydrofera Blue Classic Foam Rope Dressing, 9x6 (mm/in) 1 x Per Day/30 Days Discharge Instructions: cut it a point and place into wound Secondary Dressing: Ailey Dressing, 4x4 (in/in) 1 x Per Day/30 Days Discharge Instructions: Apply over dressing to secure in place. Consults o Podiatry - refer to Dr. Amalia Hailey, [facility] Electronic Signature(s) Signed: 03/23/2022 5:11:42 PM By: Massie Kluver Signed: 03/23/2022 6:10:21 PM By: Worthy Keeler PA-C Entered By: Massie Kluver on 03/23/2022  10:17:34 ILYAS, AMOROSO ([id number]) -------------------------------------------------------------------------------- Problem List Details Patient Name: ZEANDRE, ZELINSKI Date of Service: 03/23/2022 9:45 AM Medical Record Number: [id number] Patient Account Number: [id number] Date of Birth/Sex: [birth year] (65 y.o. 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 without 09/07/2021 No Yes evidence of necrosis E11.622 Type 2 diabetes mellitus with other skin ulcer 09/07/2021 No Yes I10 Essential (primary) hypertension 09/07/2021 No Yes I25.10 Atherosclerotic heart disease of native coronary artery without angina 09/07/2021 No Yes pectoris Inactive Problems Resolved Problems Electronic Signature(s) Signed: 03/23/2022 10:25:29 AM By: Worthy Keeler PA-C Entered By: Worthy Keeler on 03/23/2022 10:25:29 Jeremy [name] ([id number]) -------------------------------------------------------------------------------- Progress Note Details Patient Name: Jeremy [name] Date of Service: 03/23/2022 9:45 AM Medical Record Number: [id number] Patient Account Number: [id number] Date of Birth/Sex: 27-Feb-1956 (65 y.o. M) Treating RN: Carlene Coria Primary Care Provider: Tracie Harrier Other Clinician: Massie Kluver Referring Provider: Tracie Harrier Treating Provider/Extender: Skipper Cliche in Treatment: 28 Subjective Chief  Complaint Information obtained from Patient Right ankle ulcer History of Present Illness (HPI) 08/18/2020 upon evaluation today patient presents for initial inspection here in our clinic concerning issues has been having with his right lateral ankle and this has been present for at least a year he tells me. He is a patient of Dr. Elvina Mattes. Subsequently Dr. Elvina Mattes has since retired hence the patient look this up and is coming here for wound care to see if we can help him out. He did have a screw pushed out of this location and since that time tells me that the screw was removed but nonetheless the hole has remained. Fortunately there does not appear to be any signs of active infection systemically at this point though it does raise the question as to whether or not this might be a bone/hardware infection being that this has been open for so long. The patient does have a history of diabetes mellitus type 2, coronary artery disease, hypertension, and unfortunately does not seem to be doing as great as I would like to see him regard to his left ankle. 08/25/2020 upon evaluation today patient appears to be doing well with regard to his ankle ulcer compared to last week this is definitely smaller. With that being said it still does probe down to bone/hardware. I still think this can be appropriate for him to see the orthopedic specialist. He has not heard from them yet he tells me. 09/05/2020 upon evaluation today patient appears to be doing extremely well in regard to his wound all things considered. I do not see that this is gotten any worse. Unfortunately also do not think he is gotten any better. We did make a referral to orthopedics to Dr. Doran Durand specifically but the patient tells me that he has not heard from them. That is unusual as they normally get in touch with patients fairly quickly. I will ask him if he possibly missed a phone call he tells me he will check when he gets home. Nonetheless he does  not want to go to [facility] anyway which he did not tell me previously therefore we will get a see about making a referral to Dr. Amalia Hailey to see what he potentially could recommend for the patient as  well I think Dr. Amalia Hailey is excellent and a good option here. Readmission: 10/28/2020 upon evaluation today patient appears to be doing about the same as when I last saw him. He did not come back for follow-up last time I saw him was 09/05/2020. Basically he tells me that he has been attempting to manage this on his own using the Hydrofera Blue rope. He did get a call from [facility] but they did not actually get him scheduled due to the fact that the patient told me that he told them he did not wanted to see them he just wanted to come back and see me because he was happy with my care. Nonetheless as I explained to the patient he has a fractured screw at the site of the wound he also has another screw where there appears to be some loosening of the screw and potential for hardware/bone infection here. Subsequently I think he really does need to see a specialist to see what they can do to help him out I do not believe him to be able to get this healed without taking care of the hardware issues. 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 He has been advised previous of a need for an above-knee amputation due to some of the aggressive breakdown in the ankle region. Nonetheless he has had the removal of the screw performed in office with Dr. Luana Shu and this was on 08/28/2021. Subsequently the patient is a poor historian he also gets upset very easily. Of note I do believe that he is going to require some dressings to be packed into the area due to the fact that to be honest this is still tracking all the way straight down to bone where there was pus and purulence coming out once I did get down to that  area. Subsequently he tells me that he is having pain although it does seem to be doing better since the screw was removed. 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 Therefore his best options probably do entail the continue following up by Dr. Ola Spurr for infectious disease coupled with wound care as best we can and try to see if we keep this clean as much as possible and keep it from becoming more infected. At some point there may be a time where this could continually get worse and may end up not doing nearly as well but right now he does not seem to be doing too poorly and I think this is the best way to support him. I am not overly confident that this is ever getting heal however. 09/15/21 upon evaluation today patient appears to be doing well with regard to his wound. He has been tolerating the dressing changes without complication. Fortunately I do not see any signs of active infection at this time. Overall I think that he is doing quite well. 09-22-2021 upon evaluation today patient appears to be doing about the same in regard to his ankle. 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 He again has been told that there is probably not a likely scenario in which this is able to heal. Nonetheless he is not a HBO  candidate due to low ejection fraction. He does see Dr. Ola Spurr he still taking antibiotics at this point we will try to keep the area open so does not develop an abscess. 09-29-2021 upon evaluation today patient appears to be doing well with regard to his wound all things considered. He continues to use the [employer] rope. This is still a fairly deep wound that I think the rope is doing well to help keep the drainage from collecting in the base of the wound  there does appear to be less drainage definitely not as purulent as what we have noticed in the past. There is no increased pain and no signs of worsening in general. 10-06-2021 upon evaluation today patient's wound is continuing to show signs of issues here at this point. Fortunately I do not see any evidence of active infection locally or systemically which is great news but at the same time he is definitely having some ongoing problems here currently. 5/8; 2-week follow-up. Right medial ankle. Small wound purulent drainage. He is on doxycycline chronically for underlying osteomyelitis. He has Semidey, Harding ([id number]) hardware in this area. He is using Hydrofera Blue packing strips 11-02-2021 upon evaluation today patient appears to be doing much better in regard to his wound I am very pleased to see this. I do not see any evidence of active infection locally nor systemically which is great news. No fevers, chills, nausea, vomiting, or diarrhea. 11-16-2021 upon evaluation today patient appears to be doing well with regard to his ankle ulcer this is a very tiny area remaining at this point. Fortunately I do not see any evidence of infection locally or systemically at this time. Overall I think we are on the right track. 11-30-2021 Upon inspection patient's wound bed actually showed signs of having just a very small opening that still between 1 to 2 mm max. The back end of the sterile Q-tip stick I was able to get down in here and it does still probe down to bone. With that being said he still has drainage as well. Nonetheless he has not wanted any further surgical intervention and so mainly is just more of something were doing to try to manage this as best we can do only thing really were doing a split Hydrofera Blue externally has a lot of new skin covering over where previously had a lot of open area but at the same time I am still concerned about the fact this probes all the way down to  bone. 12-14-2021 upon evaluation today patient appears to be doing about the same in regard to his wound. This still actually probes down to bone. He has not seen ID anytime recently. Has been off of the doxycycline for quite some time. For that reason I really feel like we may need to see about getting something done a little bit more specific for him here and when to try to obtain a culture today so that we can see what that shows and maybe get him on antibiotics that could help this to heal faster. He is in agreement with that plan. 12-28-2021 upon evaluation today patient appears to be doing worse in regard to his wound. Unfortunately this has sealed up and does appear to be trapping quite a bit of fluid underneath the skin the skin is loose and honestly needs to be trimmed away I discussed that with the patient today. He voiced understanding and did consent to the debridement. This is good to be done with  scissors and forceps. 01-11-2022 upon evaluation today patient's wound actually is showing signs of being a little better than last time I saw him. I do believe that the doxycycline has been effective in calming this down to some degree. Fortunately I do not see any evidence of active infection at this time systemically although locally he is actually having some signs of cellulitis though this is much better compared to 2 weeks ago when I last saw him. 01-25-2022 upon evaluation today patient appears to be doing well with regard to his foot ulcer all things considered. He still has a wound that goes deeper I think switching back to the [facility] rope is probably can to be better for him at this point. 02-08-2022 upon evaluation today patient appears to be doing well currently in regard to his wound all things considered. This is still chronic osteomyelitis issue at least from the previous evaluations. He has previously been on antibiotics for an extended period of time. Right now I have a  Mannam which I started back on July 17 and that was for 2 refills he is still taking that. There was an initial 30-day supply. That should actually carry him from July 17 to October 17. With that being said I am really not sure that we are seeing a whole lot of improvement we seem to have some waxing and waning but would not ever really completely seeing this healed unfortunately. I do think the Hydrofera Blue rope is the best way to go however this still is going all the way down to bone and that has me concerned. I discussed this with the patient before but he really does not want to take any further surgical intervention he mainly is just wanting to try let this heal its own way and he is happy with the way things are going at the moment. 02-23-2022 upon evaluation today patient appears to be doing well currently in regard to his wound all things considered. He still taking the doxycycline which I do have him on long-term in order to keep this under control. He was post to be on it by infectious disease but when that ran out I continued this as he has continued and ongoing chronic osteomyelitis of the ankle. He has been recommended amputation but he does not want to proceed with that. He also is not a HBO candidate at this point. He also tells me that he really feels like it is "doing better even though the size everything is really about the same there is no significant improvement in general here. 03-09-2022 upon evaluation today patient appears to be doing decently well in regard to his wound. This is still probing down to bone however. Again we discussed treatment options which have always included amputation and IV antibiotics but which the patient has declined in the past. Subsequently we have had him on oral antibiotics for some time and this does seem to help to a degree. With that being said I do not see any evidence of infection locally or systemically right now although he does still have  some drainage this is not doing nearly as poorly as what it had been in the past. With that being said I do think that doing an updated x-ray would not be a bad idea based on where we stand currently as well. That was discussed with the patient today. 03-23-2022 upon evaluation today patient's wound actually is showing signs of doing about the same in regard to  the ankle region. Fortunately there does not appear to be any signs of active infection locally nor systemically at this time which is great news. With that being said I did review his x-rays and in fact reviewed the actual images as well. What I found was that I am concerned about the hardware loosening and to be honest I think that this is something that he probably needs to have either revision or removal of the hardware in general in order to try to allow this wound to heal with the hardware there and I feel like going to continue to have issues to be perfectly honest. I discussed that with him today and I would like for him to see Dr. Merril Abbe who is a local podiatrist whom I trust to see what he suggest about any possibility of infection from the hardware and whether or not the hardware loosening could be potentially a cause here and something that could be fixed with intervention. Objective Constitutional Well-nourished and well-hydrated in no acute distress. Vitals Time Taken: 9:54 AM, Height: 71 in, Weight: 250 lbs, BMI: 34.9, Temperature: 98.3 F, Pulse: 64 bpm, Respiratory Rate: 18 breaths/min, Blood Pressure: 158/79 mmHg. Saini, Haleem ([id number]) Respiratory normal breathing without difficulty. Psychiatric this patient is able to make decisions and demonstrates good insight into disease process. Alert and Oriented x 3. pleasant and cooperative. General Notes: Upon inspection patient's wound actually appears to be doing about the same. I do not see any signs of worsening although also do not see any signs of significant  improvement either. Integumentary (Hair, Skin) Wound #3 status is Open. Original cause of wound was Gradually Appeared. The date acquired was: 08/24/2021. The wound has been in treatment 28 weeks. The wound is located on the Right,Lateral Ankle. The wound measures 0.5cm length x 0.7cm width x 0.9cm depth; 0.275cm^2 area and 0.247cm^3 volume. There is bone and Fat Layer (Subcutaneous Tissue) exposed. There is a medium amount of purulent drainage noted. There is medium (34-66%) pink granulation within the wound bed. There is a small (1-33%) amount of necrotic tissue within the wound bed including Adherent Slough. 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 Bathing/ Shower/ Hygiene: May shower; gently cleanse wound with antibacterial soap, rinse and pat dry prior to dressing wounds No tub bath. Edema Control - Lymphedema / Segmental Compressive Device / Other: Elevate leg(s) parallel to the floor when sitting. DO YOUR BEST to sleep in the bed at night. DO NOT sleep in your recliner. Long hours of sitting in a recliner leads to swelling of the legs and/or potential wounds on your backside. Additional Orders / Instructions: Follow Nutritious Diet and Increase Protein Intake - monitor blood glucose to maintain normal level Culture obtained today Medications-Please add to medication list.: P.O. Antibiotics - continue Doxycycline Consults ordered were: Podiatry - refer to Dr. Amalia Hailey, [facility] WOUND #3: - Ankle Wound Laterality: Right, Lateral Cleanser: Normal Saline 1 x Per Day/30 Days Discharge Instructions: Wash your  hands with soap and water. Remove old dressing, discard into plastic bag and place into trash. Cleanse the wound with Normal Saline prior to applying a clean dressing using gauze sponges, not tissues or cotton balls. Do not scrub or  use excessive force. Pat dry using gauze sponges, not tissue or cotton balls. Primary Dressing: Hydrofera Blue Classic Foam Rope Dressing, 9x6 (mm/in) 1 x Per Day/30 Days Discharge Instructions: cut it a point and place into wound Secondary Dressing: [place] Dressing, 4x4 (in/in) 1 x Per Day/30 Days Discharge Instructions: Apply over dressing to secure in place. 1. Based on what I am seeing I do believe that with the x-rays reviewed as well that the patient likely is having an issue here with a fairly significant infection which is probably down to the hardware and still causing him some trouble it even looks like to me the hardware may be loosening and some of the screws may not be in place compared to where they were in previous x-rays compared to more recent x-rays that I just sent him for that was from March till now. With that being said I do believe that it would be a good idea to have him see a surgeon to see if there is anything they feel like they can do he does not want to go back to his primary surgeon in fact he would never even tell me the surgeon's name. He was not very happy with the care. Nonetheless I do believe that Dr. Earley Favor could be an option to see what he thinks about what is going on and I discussed that with him today. The patient does want to see Dr. Amalia Hailey and see what he thinks. 2. I am good recommend as well patient should continue for the time being with the [name] which I think is probably still good to be the right way to go. TRIPPER, SCHNEEKLOTH ([id number]) We will see patient back for reevaluation in 2 weeks here in the clinic. If anything worsens or changes patient will contact our office for  additional recommendations. Electronic Signature(s) Signed: 03/23/2022 10:28:10 AM By: Worthy Keeler PA-C Entered By: Worthy Keeler on 03/23/2022 10:28:10 Jeremy [name] ([id number]) -------------------------------------------------------------------------------- SuperBill Details Patient Name: Jeremy [name] Date of Service: 03/23/2022 Medical Record Number: [id number] Patient Account Number: [id number] Date of Birth/Sex: 1955-10-01 (65 y.o. 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 E11.622 Type 2 diabetes mellitus with other skin ulcer I10 Essential (primary) hypertension I25.10 Atherosclerotic heart disease of native coronary artery without angina pectoris Facility Procedures CPT4 Code: [id number] Description: 99213 - WOUND CARE VISIT-LEV 3 EST PT Modifier: Quantity: 1 Physician Procedures CPT4 Code Description: [id number] - WC PHYS LEVEL 4 - EST PT Modifier: Quantity: 1 CPT4 Code Description: ICD-10 Diagnosis Description M86.471 Chronic osteomyelitis with draining sinus, right ankle and foot L97.316 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

## 2022-03-26 NOTE — Progress Notes (Signed)
ROBBIN, ESCHER ([id number]) Visit Report for 03/23/2022 Arrival Information Details Patient Name: Jeremy [name], Jeremy [name] Date of Service: 03/23/2022 9:45 AM Medical Record Number: [id number] Patient Account Number: [id number] Date of Birth/Sex: November 12, 1955 (65 y.o. M) Treating RN: Yevonne Pax Primary Care Kinzee Happel: Barbette Reichmann Other Clinician: [name] Referring Jacquees Gongora: Barbette Reichmann Treating Alvin Diffee/Extender: Rowan Blase in Treatment: 28 Visit Information History Since Last Visit All ordered tests and consults were completed: No Patient Arrived: Ambulatory Added or deleted any medications: No Arrival Time: 09:52 Any new allergies or adverse reactions: No Transfer Assistance: None Had a fall or experienced change in No Patient Requires Transmission-Based No activities of daily living that may affect Precautions: risk of falls: Patient Has Alerts: Yes Hospitalized since last visit: No Patient Alerts: Patient on Blood Pain Present Now: No Thinner Eliquis Electronic Signature(s) Signed: 03/23/2022 5:11:42 PM By: [name] Entered By: [name] on 03/23/2022 09:53:56 Jeremy [name] ([id number]) -------------------------------------------------------------------------------- Clinic Level of Care Assessment Details Patient Name: Jeremy [name] Date of Service: 03/23/2022 9:45 AM Medical Record Number: [id number] Patient Account Number: [id number] Date of Birth/Sex: [birth year] (65 y.o. M) Treating RN: Yevonne Pax Primary Care Maddex Garlitz: Barbette Reichmann Other Clinician: [name] Referring Mauri Temkin: Barbette Reichmann Treating Evania Lyne/Extender: Rowan Blase in Treatment: 28 Clinic Level of Care Assessment Items TOOL 4 Quantity Score []  - Use when only an EandM is performed on FOLLOW-UP visit 0 ASSESSMENTS - Nursing Assessment / Reassessment X - Reassessment of Co-morbidities (includes updates in patient status) 1 10 X- 1  5 Reassessment of Adherence to Treatment Plan ASSESSMENTS - Wound and Skin Assessment / Reassessment X - Simple Wound Assessment / Reassessment - one wound 1 5 []  - 0 Complex Wound Assessment / Reassessment - multiple wounds []  - 0 Dermatologic / Skin Assessment (not related to wound area) ASSESSMENTS - Focused Assessment []  - Circumferential Edema Measurements - multi extremities 0 []  - 0 Nutritional Assessment / Counseling / Intervention []  - 0 Lower Extremity Assessment (monofilament, tuning fork, pulses) []  - 0 Peripheral Arterial Disease Assessment (using hand held doppler) ASSESSMENTS - Ostomy and/or Continence Assessment and Care []  - Incontinence Assessment and Management 0 []  - 0 Ostomy Care Assessment and Management (repouching, etc.) PROCESS - Coordination of Care X - Simple Patient / Family Education for ongoing care 1 15 []  - 0 Complex (extensive) Patient / Family Education for ongoing care []  - 0 Staff obtains , Records, Test Results / Process Orders []  - 0 Staff telephones HHA, Nursing Homes / Clarify orders / etc []  - 0 Routine Transfer to another Facility (non-emergent condition) []  - 0 Routine Hospital Admission (non-emergent condition) []  - 0 New Admissions / / Ordering NPWT, Apligraf, etc. []  - 0 Emergency Hospital Admission (emergent condition) X- 1 10 Simple Discharge Coordination []  - 0 Complex (extensive) Discharge Coordination PROCESS - Special Needs []  - Pediatric / Minor Patient Management 0 []  - 0 Isolation Patient Management []  - 0 Hearing / Language / Visual special needs []  - 0 Assessment of Community assistance (transportation, D/C planning, etc.) []  - 0 Additional assistance / Altered mentation []  - 0 Support Surface(s) Assessment (bed, cushion, seat, etc.) INTERVENTIONS - Wound Cleansing / Measurement Hailes, Lemond ( ) X- 1 5 Simple Wound Cleansing - one wound []  - 0 Complex Wound  Cleansing - multiple wounds X- 1 5 Wound Imaging (photographs - any number of wounds) []  - 0 Wound Tracing (instead of photographs) X- 1 5 Simple Wound Measurement - one wound []  - 0 Complex  Wound Measurement - multiple wounds INTERVENTIONS - Wound Dressings []  - Small Wound Dressing one or multiple wounds 0 X- 1 15 Medium Wound Dressing one or multiple wounds []  - 0 Large Wound Dressing one or multiple wounds []  - 0 Application of Medications - topical []  - 0 Application of Medications - injection INTERVENTIONS - Miscellaneous []  - External ear exam 0 []  - 0 Specimen Collection (cultures, biopsies, blood, body fluids, etc.) []  - 0 Specimen(s) / Culture(s) sent or taken to Lab for analysis []  - 0 Patient Transfer (multiple staff / / Similar devices) []  - 0 Simple Staple / Suture removal (25 or less) []  - 0 Complex Staple / Suture removal (26 or more) []  - 0 Hypo / Hyperglycemic Management (close monitor of Blood Glucose) []  - 0 Ankle / Brachial Index (ABI) - do not check if billed separately X- 1 5 Vital Signs Has the patient been seen at the hospital within the last three years: Yes Total Score: 80 Level Of Care: New/Established - Level 3 Electronic Signature(s) Signed: 03/23/2022 5:11:42 PM By: Entered By: on 03/23/2022 10:16:20 ( ) -------------------------------------------------------------------------------- Encounter Discharge Information Details Patient Name: Date of Service: 03/23/2022 9:45 AM Medical Record Number: Patient Account Number: Date of Birth/Sex: Sep 24, 1955 (65 y.o. M) Treating RN: 05/23/2022 Primary Care Jenya Putz: [name] Other Clinician: [name] Referring Gonsalo Cuthbertson: 05/23/2022 Treating Tytiana Coles/Extender: Jeremy [name] in Treatment: 28 Encounter Discharge Information Items Discharge Condition:  Stable Ambulatory Status: Ambulatory Discharge Destination: Home Transportation: Private Auto Accompanied By: self Schedule Follow-up Appointment: Yes Clinical Summary of Care: Electronic Signature(s) Signed: 03/23/2022 5:11:42 PM By: 05/23/2022 Entered By: [id number] on 03/23/2022 10:25:11 06/09/1956 (09-07-1989) -------------------------------------------------------------------------------- Lower Extremity Assessment Details Patient Name: Yevonne Pax Date of Service: 03/23/2022 9:45 AM Medical Record Number: [name] Patient Account Number: Barbette Reichmann Date of Birth/Sex: 13-Jan-1956 (65 y.o. M) Treating RN: 05/23/2022 Primary Care Caedin Mogan: [name] Other Clinician: [name] Referring Nicklas Mcsweeney: 05/23/2022 Treating Lateya Dauria/Extender: Jeremy [name] in Treatment: 28 Electronic Signature(s) Signed: 03/23/2022 5:11:42 PM By: Jeremy [name] Signed: 03/26/2022 12:25:01 PM By: [id number] RN Entered By: [id number] on 03/23/2022 10:05:34 09-07-1989 (Yevonne Pax) -------------------------------------------------------------------------------- Multi Wound Chart Details Patient Name: Barbette Reichmann Date of Service: 03/23/2022 9:45 AM Medical Record Number: Barbette Reichmann Patient Account Number: Rowan Blase Date of Birth/Sex: 08-24-1955 (65 y.o. M) Treating RN: 03/28/2022 Primary Care Jullia Mulligan: Yevonne Pax Other Clinician: [name] Referring Nafeesa Dils: 05/23/2022 Treating Kanani Mowbray/Extender: Jeremy [name] in Treatment: 28 Vital Signs Height(in): 71 Pulse(bpm): 64 Weight(lbs): 250 Blood Pressure(mmHg): 158/79 Body Mass Index(BMI): 34.9 Temperature(F): 98.3 Respiratory Rate(breaths/min): 18 Photos: [N/A:N/A] Wound Location: Right, Lateral Ankle N/A N/A Wounding Event: Gradually Appeared N/A N/A Primary Etiology: Refractory Osteomyelitis N/A N/A Secondary Etiology: Diabetic Wound/Ulcer of the Lower N/A  N/A Extremity Comorbid History: Arrhythmia, Congestive Heart N/A N/A Failure, Coronary Artery Disease, Hypertension, Type II Diabetes, Osteoarthritis, Osteomyelitis Date Acquired: 08/24/2021 N/A N/A Weeks of Treatment: 28 N/A N/A Wound Status: Open N/A N/A Wound Recurrence: No N/A N/A Pending Amputation on Yes N/A N/A Presentation: Measurements L x W x D (cm) 0.5x0.7x0.9 N/A N/A Area (cm) : 0.275 N/A N/A Volume (cm) : 0.247 N/A N/A % Reduction in Area: -787.10% N/A N/A % Reduction in Volume: -1022.70% N/A N/A Classification: Full Thickness With Exposed N/A N/A Support Structures Exudate Amount: Medium N/A N/A Exudate Type: Purulent N/A N/A Exudate Color: yellow, brown, green N/A N/A Granulation Amount: Medium (34-66%)  N/A N/A Granulation Quality: Pink N/A N/A Necrotic Amount: Small (1-33%) N/A N/A Exposed Structures: Fat Layer (Subcutaneous Tissue): N/A N/A Yes Bone: Yes Fascia: No Tendon: No Muscle: No Joint: No Epithelialization: Small (1-33%) N/A N/A Treatment Notes Electronic LARRI, YEHLE ([id number]) Signed: 03/23/2022 5:11:42 PM By: [name] Entered By: [name] on 03/23/2022 10:05:57 Jeremy [name] ([id number]) -------------------------------------------------------------------------------- Multi-Disciplinary Care Plan Details Patient Name: Jeremy [name] Date of Service: 03/23/2022 9:45 AM Medical Record Number: [id number] Patient Account Number: [id number] Date of Birth/Sex: [birth year] (65 y.o. M) Treating RN: Yevonne Pax Primary Care Riyanna Crutchley: Barbette Reichmann Other Clinician: [name] Referring Kimberlye Dilger: Barbette Reichmann Treating Jakaylah Schlafer/Extender: Rowan Blase in Treatment: 28 Active Inactive Osteomyelitis Nursing Diagnoses: Knowledge deficit related to disease process and management Potential for infection: osteomyelitis Goals: Patient/caregiver will verbalize understanding of disease process and  disease management Date Initiated: 09/07/2021 Target Resolution Date: 09/18/2021 Goal Status: Active Interventions: Assess for signs and symptoms of osteomyelitis resolution every visit Provide education on osteomyelitis Notes: Wound/Skin Impairment Nursing Diagnoses: Impaired tissue integrity Knowledge deficit related to smoking impact on wound healing Knowledge deficit related to ulceration/compromised skin integrity Goals: Patient will demonstrate a reduced rate of smoking or cessation of smoking Date Initiated: 09/07/2021 Target Resolution Date: 10/16/2021 Goal Status: Active Patient/caregiver will verbalize understanding of skin care regimen Date Initiated: 09/07/2021 Target Resolution Date: 09/18/2021 Goal Status: Active Ulcer/skin breakdown will have a volume reduction of 30% by week 4 Date Initiated: 09/07/2021 Target Resolution Date: 10/05/2021 Goal Status: Active Ulcer/skin breakdown will have a volume reduction of 50% by week 8 Date Initiated: 09/07/2021 Target Resolution Date: 11/02/2021 Goal Status: Active Ulcer/skin breakdown will have a volume reduction of 80% by week 12 Date Initiated: 09/07/2021 Target Resolution Date: 11/30/2021 Goal Status: Active Ulcer/skin breakdown will heal within 14 weeks Date Initiated: 09/07/2021 Target Resolution Date: 12/14/2021 Goal Status: Active Interventions: Assess patient/caregiver ability to obtain necessary supplies Assess patient/caregiver ability to perform ulcer/skin care regimen upon admission and as needed Assess ulceration(s) every visit Notes: Electronic Signature(s) KENNIS, WISSMANN ([id number]) Signed: 03/23/2022 5:11:42 PM By: [name] Signed: 03/26/2022 12:25:01 PM By: Yevonne Pax RN Entered By: [name] on 03/23/2022 10:05:43 Jeremy [name] ([id number]) -------------------------------------------------------------------------------- Pain Assessment Details Patient Name: Jeremy [name] Date of Service:  03/23/2022 9:45 AM Medical Record Number: [id number] Patient Account Number: [id number] Date of Birth/Sex: 04-03-1956 (65 y.o. M) Treating RN: Yevonne Pax Primary Care Dajia Gunnels: Barbette Reichmann Other Clinician: [name] Referring Margert Edsall: Barbette Reichmann Treating Aviyanna Colbaugh/Extender: Rowan Blase in Treatment: 28 Active Problems Location of Pain Severity and Description of Pain Patient Has Paino No Site Locations Pain Management and Medication Current Pain Management: Electronic Signature(s) Signed: 03/23/2022 5:11:42 PM By: [name] Signed: 03/26/2022 12:25:01 PM By: Yevonne Pax RN Entered By: [name] on 03/23/2022 09:59:16 Jeremy [name] ([id number]) -------------------------------------------------------------------------------- Patient/Caregiver Education Details Patient Name: Jeremy [name] Date of Service: 03/23/2022 9:45 AM Medical Record Number: [id number] Patient Account Number: [id number] Date of Birth/Gender: [birth year] (65 y.o. M) Treating RN: Yevonne Pax Primary Care Physician: Barbette Reichmann Other Clinician: [name] Referring Physician: Barbette Reichmann Treating Physician/Extender: Rowan Blase in Treatment: 52 Education Assessment Education Provided To: Patient Education Topics Provided Wound/Skin Impairment: Handouts: Other: continue wound care as directed Methods: Explain/Verbal Responses: State content correctly Electronic Signature(s) Signed: 03/23/2022 5:11:42 PM By: [name] Entered By: [name] on 03/23/2022 10:16:50 Jeremy [name] ([id number]) -------------------------------------------------------------------------------- Wound Assessment Details Patient Name: Jeremy [name] Date of Service: 03/23/2022 9:45 AM Medical Record Number: [id number] Patient Account Number: [id number] Date of  Birth/Sex: [birth year] (66 y.o. M) Treating RN: Carlene Coria Primary Care Vicke Plotner: Tracie Harrier Other Clinician: Massie Kluver Referring Judianne Seiple: Tracie Harrier Treating Reagen Goates/Extender: Skipper Cliche in Treatment: 28 Wound Status Wound Number: 3 Primary Refractory Osteomyelitis Etiology: Wound Location: Right, Lateral Ankle Secondary Diabetic Wound/Ulcer of the Lower Extremity Wounding Event: Gradually Appeared Etiology: Date Acquired: 08/24/2021 Wound Open Weeks Of Treatment: 28 Status: Clustered Wound: No Comorbid Arrhythmia, Congestive Heart Failure, Coronary Artery Pending Amputation On Presentation History: Disease, Hypertension, Type II Diabetes, Osteoarthritis, Osteomyelitis Photos Wound Measurements Length: (cm) 0.5 Width: (cm) 0.7 Depth: (cm) 0.9 Area: (cm) 0.275 Volume: (cm) 0.247 % Reduction in Area: -787.1% % Reduction in Volume: -1022.7% Epithelialization: Small (1-33%) Wound Description Classification: Full Thickness With Exposed Support Structures Exudate Amount: Medium Exudate Type: Purulent Exudate Color: yellow, brown, green Foul Odor After Cleansing: No Slough/Fibrino Yes Wound Bed Granulation Amount: Medium (34-66%) Exposed Structure Granulation Quality: Pink Fascia Exposed: No Necrotic Amount: Small (1-33%) Fat Layer (Subcutaneous Tissue) Exposed: Yes Necrotic Quality: Adherent Slough Tendon Exposed: No Muscle Exposed: No Joint Exposed: No Bone Exposed: Yes Treatment Notes Wound #3 (Ankle) Wound Laterality: Right, Lateral Cleanser Normal Saline Discharge Instruction: Wash your hands with soap and water. Remove old dressing, discard into plastic bag and place into trash. Cleanse the wound with Normal Saline prior to applying a clean dressing using gauze sponges, not tissues or cotton balls. Do not [place], Raegan ([id number]) scrub or use excessive force. Pat dry using gauze sponges, not tissue or cotton balls. Peri-Wound Care Topical Primary Dressing Hydrofera Blue Classic Foam Rope Dressing, 9x6  (mm/in) Discharge Instruction: cut it a point and place into wound Secondary Dressing [place] Dressing, 4x4 (in/in) Discharge Instruction: Apply over dressing to secure in place. Secured With Compression Wrap Compression Stockings Add-Ons Electronic Signature(s) Signed: 03/23/2022 5:11:42 PM By: Massie Kluver Signed: 03/26/2022 12:25:01 PM By: Carlene Coria RN Entered By: Massie Kluver on 03/23/2022 10:05:21 [name] ([id number]) -------------------------------------------------------------------------------- Vitals Details Patient Name: [name] Date of Service: 03/23/2022 9:45 AM Medical Record Number: [id number] Patient Account Number: [id number] Date of Birth/Sex: Jan 27, 1956 (65 y.o. M) Treating RN: Carlene Coria Primary Care Katina Remick: Tracie Harrier Other Clinician: Massie Kluver Referring Laveta Gilkey: Tracie Harrier Treating Dorsie Burich/Extender: Skipper Cliche in Treatment: 28 Vital Signs Time Taken: 09:54 Temperature (F): 98.3 Height (in): 71 Pulse (bpm): 64 Weight (lbs): 250 Respiratory Rate (breaths/min): 18 Body Mass Index (BMI): 34.9 Blood Pressure (mmHg): 158/79 Reference Range: 80 - 120 mg / dl Electronic Signature(s) Signed: 03/23/2022 5:11:42 PM By: Massie Kluver Entered By: Massie Kluver on 03/23/2022 62:94:76

## 2022-04-06 ENCOUNTER — Encounter: Payer: Medicaid Other | Admitting: Physician Assistant

## 2022-04-06 DIAGNOSIS — E1169 Type 2 diabetes mellitus with other specified complication: Secondary | ICD-10-CM | POA: Diagnosis not present

## 2022-04-06 NOTE — Progress Notes (Signed)
SHAHEER, BONFIELD ([id number]) [id number] Page 1 of 9 Visit Report for 04/06/2022 Arrival Information Details Patient Name: Date of Service: [place], [place] 04/06/2022 9:45 A M Medical Record Number: [id number] Patient Account Number: [id number] Date of Birth/Sex: Treating RN: 02-04-1956 (66 y.o. Verl Blalock Primary Care Veda Arrellano: Tracie Harrier Other Clinician: Massie Kluver Referring Zianne Schubring: Treating Kerwin Augustus/Extender: Solon Palm Weeks in Treatment: 101 Visit Information History Since Last Visit All ordered tests and consults were completed: No Patient Arrived: Ambulatory Added or deleted any medications: No Arrival Time: 09:55 Any new allergies or adverse reactions: No Transfer Assistance: None Had a fall or experienced change in No Patient Requires Transmission-Based Precautions: No activities of daily living that may affect Patient Has Alerts: Yes risk of falls: Patient Alerts: Patient on Blood Thinner Hospitalized since last visit: No Eliquis Pain Present Now: No Electronic Signature(s) Signed: 04/08/2022 7:57:14 AM By: Massie Kluver Entered By: Massie Kluver on 04/06/2022 10:06:07 -------------------------------------------------------------------------------- Clinic Level of Care Assessment Details Patient Name: Date of Service: [place], [place] 04/06/2022 9:45 A M Medical Record Number: [id number] Patient Account Number: [id number] Date of Birth/Sex: Treating RN: 1955/12/08 (66 y.o. Verl Blalock Primary Care Eulamae Greenstein: Tracie Harrier Other Clinician: Massie Kluver Referring Carisha Kantor: Treating Vasil Juhasz/Extender: Solon Palm Weeks in Treatment: 30 Clinic Level of Care Assessment Items TOOL 4 Quantity Score []  - 0 Use when only an EandM is performed on FOLLOW-UP visit ASSESSMENTS - Nursing Assessment / Reassessment X- 1 10 Reassessment of Co-morbidities (includes updates in  patient status) X- 1 5 Reassessment of Adherence to Treatment Plan ASSESSMENTS - Wound and Skin A ssessment / Reassessment X - Simple Wound Assessment / Reassessment - one wound 1 5 []  - 0 Complex Wound Assessment / Reassessment - multiple wounds Mackel, Akash ([id number]) [id number] Page 2 of 9 []  - 0 Dermatologic / Skin Assessment (not related to wound area) ASSESSMENTS - Focused Assessment []  - 0 Circumferential Edema Measurements - multi extremities []  - 0 Nutritional Assessment / Counseling / Intervention []  - 0 Lower Extremity Assessment (monofilament, tuning fork, pulses) []  - 0 Peripheral Arterial Disease Assessment (using hand held doppler) ASSESSMENTS - Ostomy and/or Continence Assessment and Care []  - 0 Incontinence Assessment and Management []  - 0 Ostomy Care Assessment and Management (repouching, etc.) PROCESS - Coordination of Care X - Simple Patient / Family Education for ongoing care 1 15 []  - 0 Complex (extensive) Patient / Family Education for ongoing care []  - 0 Staff obtains Programmer, systems, Records, T Results / Process Orders est []  - 0 Staff telephones HHA, Nursing Homes / Clarify orders / etc []  - 0 Routine Transfer to another Facility (non-emergent condition) []  - 0 Routine Hospital Admission (non-emergent condition) []  - 0 New Admissions / Biomedical engineer / Ordering NPWT Apligraf, etc. , []  - 0 Emergency Hospital Admission (emergent condition) X- 1 10 Simple Discharge Coordination []  - 0 Complex (extensive) Discharge Coordination PROCESS - Special Needs []  - 0 Pediatric / Minor Patient Management []  - 0 Isolation Patient Management []  - 0 Hearing / Language / Visual special needs []  - 0 Assessment of Community assistance (transportation, D/C planning, etc.) []  - 0 Additional assistance / Altered mentation []  - 0 Support Surface(s) Assessment (bed, cushion, seat, etc.) INTERVENTIONS - Wound Cleansing /  Measurement X - Simple Wound Cleansing - one wound 1 5 []  - 0 Complex Wound Cleansing - multiple wounds X- 1 5 Wound Imaging (photographs - any number of wounds) []  - 0  Wound Tracing (instead of photographs) X- 1 5 Simple Wound Measurement - one wound []  - 0 Complex Wound Measurement - multiple wounds INTERVENTIONS - Wound Dressings []  - 0 Small Wound Dressing one or multiple wounds X- 1 15 Medium Wound Dressing one or multiple wounds []  - 0 Large Wound Dressing one or multiple wounds []  - 0 Application of Medications - topical []  - 0 Application of Medications - injection INTERVENTIONS - Miscellaneous []  - 0 External ear exam []  - 0 Specimen Collection (cultures, biopsies, blood, body fluids, etc.) []  - 0 Specimen(s) / Culture(s) sent or taken to Lab for analysis Sterbenz, ( ) [phone].pdf Page 3 of 9 []  - 0 Patient Transfer (multiple staff / / Similar devices) []  - 0 Simple Staple / Suture removal (25 or less) []  - 0 Complex Staple / Suture removal (26 or more) []  - 0 Hypo / Hyperglycemic Management (close monitor of Blood Glucose) []  - 0 Ankle / Brachial Index (ABI) - do not check if billed separately X- 1 5 Vital Signs Has the patient been seen at the hospital within the last three years: Yes Total Score: 80 Level Of Care: New/Established - Level 3 Electronic Signature(s) Signed: 04/08/2022 7:57:14 AM By: Entered By: on 04/06/2022 10:45:26 -------------------------------------------------------------------------------- Encounter Discharge Information Details Patient Name: Date of Service: [place] Gaynell Face, [place] [facility] [place] 04/06/2022 9:45 A M Medical Record Number: [id number] Patient Account Number: Date of Birth/Sex: Treating RN: 1955/12/24 (66 y.o. Primary Care Byron Tipping: Other Clinician: Referring Dmiya Malphrus: Treating  Domnick Chervenak/Extender: 04/10/2022 Weeks in Treatment: 30 Encounter Discharge Information Items Discharge Condition: Stable Ambulatory Status: Ambulatory Discharge Destination: Home Transportation: Private Auto Accompanied By: SELF Schedule Follow-up Appointment: Yes Clinical Summary of Care: Electronic Signature(s) Signed: 04/08/2022 7:57:14 AM By: [name] Entered By: 04/08/2022 on 04/06/2022 10:55:51 -------------------------------------------------------------------------------- Lower Extremity Assessment Details Patient Name: Date of Service: 04/08/2022, [id number] R[facility] LL 04/06/2022 9:45 A M Medical Record Number: 06/09/1956 Patient Account Number: [id number] Date of Birth/Sex: Treating RN: [birth year] (66 y.o. [name] Primary Care Whitnee Orzel: Jearld Shines Other Clinician: 04/10/2022 Referring Justun Anaya: Treating Clorine Swing/Extender: [name] [place], 04/08/2022 ([name]) [id number] Page 4 of 9 Weeks in Treatment: 30 Electronic Signature(s) Signed: 04/06/2022 5:27:43 PM By: 04/08/2022, BSN, RN, CWS, Kim RN, BSN Signed: 04/08/2022 7:57:14 AM By: [id number] Entered By: 06/09/1956 on 04/06/2022 10:20:45 -------------------------------------------------------------------------------- Multi Wound Chart Details Patient Name: Date of Service: [place] [name], [place] [facility] LL 04/06/2022 9:45 A M Medical Record Number: [name] Patient Account Number: Jearld Shines Date of Birth/Sex: Treating RN: 1955-08-22 (66 y.o. [id number] Primary Care Rooney Gladwin: 04/08/2022 Other Clinician: Elliot Gurney Referring Renny Gunnarson: Treating Lorenso Quirino/Extender: 04/10/2022 Weeks in Treatment: 30 Vital Signs Height(in): 71 Pulse(bpm): 112 Weight(lbs): 250 Blood Pressure(mmHg): 141/93 Body Mass Index(BMI): 34.9 Temperature(F): 98.0 Respiratory Rate(breaths/min): 16 [3:Photos:] [N/A:N/A] Right, Lateral  Ankle N/A N/A Wound Location: Gradually Appeared N/A N/A Wounding Event: Refractory Osteomyelitis N/A N/A Primary Etiology: Diabetic Wound/Ulcer of the Lower N/A N/A Secondary Etiology: Extremity Arrhythmia, Congestive Heart Failure, N/A N/A Comorbid History: Coronary Artery Disease, Hypertension, Type II Diabetes, Osteoarthritis, Osteomyelitis 08/24/2021 N/A N/A Date Acquired: 30 N/A N/A Weeks of Treatment: Open N/A N/A Wound Status: No N/A N/A Wound Recurrence: Yes N/A N/A Pending A mputation on Presentation: 0.3x0.3x1.7 N/A N/A Measurements L x W x D (cm) 0.071 N/A N/A A (cm) : rea 0.12 N/A N/A Volume (cm) : -129.00%  N/A N/A % Reduction in A rea: -445.50% N/A N/A % Reduction in Volume: 12 Starting Position 1 (o'clock): 12 Ending Position 1 (o'clock): 1 Maximum Distance 1 (cm): Yes N/A N/A Undermining: Full Thickness With Exposed Support N/A N/A Classification: Structures Medium N/A N/A Exudate Amount: Purulent N/A N/A Exudate Type: yellow, brown, green N/A N/A Exudate Color: Medium (34-66%) N/A N/A Granulation AmountNATHEN, BALABAN ([id number]) [id number] Page 5 of 9 Pink N/A N/A Granulation Quality: Small (1-33%) N/A N/A Necrotic Amount: Fat Layer (Subcutaneous Tissue): Yes N/A N/A Exposed Structures: Bone: Yes Fascia: No Tendon: No Muscle: No Joint: No Small (1-33%) N/A N/A Epithelialization: Treatment Notes Electronic Signature(s) Signed: 04/08/2022 7:57:14 AM By: [name] Entered By: [name] on 04/06/2022 10:21:06 -------------------------------------------------------------------------------- Multi-Disciplinary Care Plan Details Patient Name: Date of Service: [name], MA [facility] LL 04/06/2022 9:45 A M Medical Record Number: [id number] Patient Account Number: [id number] Date of Birth/Sex: Treating RN: [birth year] (66 y.o. [name] Primary Care Roni Friberg: Barbette Reichmann Other Clinician:  [name] Referring Halea Lieb: Treating Adalynne Steffensmeier/Extender: Jearld Shines Weeks in Treatment: 30 Active Inactive Osteomyelitis Nursing Diagnoses: Knowledge deficit related to disease process and management Potential for infection: osteomyelitis Goals: Patient/caregiver will verbalize understanding of disease process and disease management Date Initiated: 09/07/2021 Target Resolution Date: 09/18/2021 Goal Status: Active Interventions: Assess for signs and symptoms of osteomyelitis resolution every visit Provide education on osteomyelitis Notes: Wound/Skin Impairment Nursing Diagnoses: Impaired tissue integrity Knowledge deficit related to smoking impact on wound healing Knowledge deficit related to ulceration/compromised skin integrity Goals: Patient will demonstrate a reduced rate of smoking or cessation of smoking Date Initiated: 09/07/2021 Target Resolution Date: 10/16/2021 Goal Status: Active Patient/caregiver will verbalize understanding of skin care regimen Date Initiated: 09/07/2021 Target Resolution Date: 09/18/2021 Goal Status: Active Ulcer/skin breakdown will have a volume reduction of 30% by week 4 Date Initiated: 09/07/2021 Target Resolution Date: 10/05/2021 GREGORY, BARRICK ([id number]) [phone] Page 6 of 9 Goal Status: Active Ulcer/skin breakdown will have a volume reduction of 50% by week 8 Date Initiated: 09/07/2021 Target Resolution Date: 11/02/2021 Goal Status: Active Ulcer/skin breakdown will have a volume reduction of 80% by week 12 Date Initiated: 09/07/2021 Target Resolution Date: 11/30/2021 Goal Status: Active Ulcer/skin breakdown will heal within 14 weeks Date Initiated: 09/07/2021 Target Resolution Date: 12/14/2021 Goal Status: Active Interventions: Assess patient/caregiver ability to obtain necessary supplies Assess patient/caregiver ability to perform ulcer/skin care regimen upon admission and as needed Assess  ulceration(s) every visit Notes: Electronic Signature(s) Signed: 04/06/2022 5:27:43 PM By: Elliot Gurney, BSN, RN, CWS, Kim RN, BSN Signed: 04/08/2022 7:57:14 AM By: [name] Entered By: [name] on 04/06/2022 10:20:52 -------------------------------------------------------------------------------- Pain Assessment Details Patient Name: Date of Service: RO Thayer Ohm, [place] [facility] 04/06/2022 9:45 A M Medical Record Number: [id number] Patient Account Number: [id number] Date of Birth/Sex: Treating RN: 05/02/1956 (66 y.o. [name] Primary Care Yumiko Alkins: Barbette Reichmann Other Clinician: [name] Referring Crescent Gotham: Treating Gabrella Stroh/Extender: Jearld Shines Weeks in Treatment: 30 Active Problems Location of Pain Severity and Description of Pain Patient Has Paino No Site Locations Pain Management and Medication Current Pain Management: Electronic Signature(s) Signed: 04/06/2022 5:27:43 PM By: Elliot Gurney, BSN, RN, CWS, Kim RN, BSN [place], [place] ([id number]) [id number] Page 7 of 9 Signed: 04/08/2022 7:57:14 AM By: [name] Entered By: [name] on 04/06/2022 10:11:42 -------------------------------------------------------------------------------- Patient/Caregiver Education Details Patient Name: Date of Service: RO Thayer Ohm, MA R[facility] LL 10/24/2023andnbsp9:45 A M Medical Record Number: [id number] Patient Account Number: [id number] Date of Birth/Gender: Treating RN: 03/06/1956 (66 y.o.  [name] Primary Care Physician: Barbette Reichmann Other Clinician: [name] Referring Physician: Treating Physician/Extender: Jearld Shines Weeks in Treatment: 30 Education Assessment Education Provided To: Patient Education Topics Provided Wound/Skin Impairment: Handouts: Other: continue wound care as directed Methods: Explain/Verbal Responses: State content correctly Electronic Signature(s) Signed: 04/08/2022  7:57:14 AM By: [name] Entered By: [name] on 04/06/2022 10:46:04 -------------------------------------------------------------------------------- Wound Assessment Details Patient Name: Date of Service: [name], MA [facility] LL 04/06/2022 9:45 A M Medical Record Number: [id number] Patient Account Number: [id number] Date of Birth/Sex: Treating RN: 1956-03-27 (66 y.o. 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 Wounding Event: Gradually Appeared Etiology: Date Acquired: 08/24/2021 Wound Open Weeks Of Treatment: 30 Status: Clustered Wound: No Comorbid Arrhythmia, Congestive Heart Failure, Coronary Artery Disease, Pending Amputation On Presentation History: Hypertension, Type II Diabetes, Osteoarthritis, Osteomyelitis [place], Tysin ([id number]) [id number] Page 8 of 9 Photos Wound Measurements Length: (cm) 0.3 Width: (cm) 0.3 Depth: (cm) 1.7 Area: (cm) 0.071 Volume: (cm) 0.12 % Reduction in Area: -129% % Reduction in Volume: -445.5% Epithelialization: Small (1-33%) Tunneling: No Undermining: Yes Starting Position (o'clock): 12 Ending Position (o'clock): 12 Maximum Distance: (cm) 1 Wound Description Classification: Full Thickness With Exposed Support Structures Exudate Amount: Medium Exudate Type: Purulent Exudate Color: yellow, brown, green Foul Odor After Cleansing: No Slough/Fibrino Yes Wound Bed Granulation Amount: Medium (34-66%) Exposed Structure Granulation Quality: Pink Fascia Exposed: No Necrotic Amount: Small (1-33%) Fat Layer (Subcutaneous Tissue) Exposed: Yes Necrotic Quality: Adherent Slough Tendon Exposed:  No Muscle Exposed: No Joint Exposed: No Bone Exposed: Yes Treatment Notes Wound #3 (Ankle) Wound Laterality: Right, Lateral Cleanser Normal Saline Discharge Instruction: Wash your hands with soap and water. Remove old dressing, discard into plastic bag and place into trash. Cleanse the wound with Normal Saline prior to applying a clean dressing using gauze sponges, not tissues or cotton balls. Do not scrub or use excessive force. Pat dry using gauze sponges, not tissue or cotton balls. Peri-Wound Care Topical Primary Dressing Hydrofera Blue Classic Foam Rope Dressing, 9x6 (mm/in) Discharge Instruction: cut it a point and place into wound Secondary Dressing T Adhesive Island Dressing, 4x4 (in/in) elfa Discharge Instruction: Apply over dressing to secure in place. Secured With Compression Wrap Compression Stockings Facilities manager) Signed: 04/06/2022 5:27:43 PM By: Elliot Gurney, BSN, RN, CWS, Kim RN, BSN Signed: 04/08/2022 7:57:14 AM By: [name] Entered By: [name] on 04/06/2022 10:20:31 [name] ([id number]) [id number] Page 9 of 9 -------------------------------------------------------------------------------- Vitals Details Patient Name: Date of Service: [name], [place] 04/06/2022 9:45 A M Medical Record Number: [id number] Patient Account Number: [id number] Date of Birth/Sex: Treating RN: 1955-06-21 (66 y.o. [name] Primary Care Rhealynn Myhre: Barbette Reichmann Other Clinician: [name] Referring Jhan Conery: Treating Kateryna Grantham/Extender: Jearld Shines Weeks in Treatment: 30 Vital Signs Time Taken: 10:06 Temperature (F): 98.0 Height (in): 71 Pulse (bpm): 112 Weight (lbs): 250 Respiratory Rate (breaths/min): 16 Body Mass Index (BMI): 34.9 Blood Pressure (mmHg): 141/93 Reference Range: 80 - 120 mg / dl Electronic Signature(s) Signed: 04/08/2022 7:57:14 AM By: [name] Entered By:  [name] on 04/06/2022 10:11:30

## 2022-04-06 NOTE — Progress Notes (Addendum)
**Note Jeremy-Identified via Obfuscation** Jeremy [name] ([id number]) [id number] Page 1 of 9 Visit Report for 04/06/2022 Chief Complaint Document Details Patient Name: Date of Service: Jeremy [name], Jeremy [facility] [name] 04/06/2022 9:45 A M Medical Record Number: [id number] Patient Account Number: [id number] Date of Birth/Sex: Treating RN: 1956-06-01 (66 y.o. [name] Primary Care Provider: Barbette Reichmann Other Clinician: [name] Referring Provider: Treating Provider/Extender: Jearld Shines Weeks in Treatment: 30 Information Obtained from: Patient Chief Complaint Right ankle ulcer Electronic Signature(s) Signed: 04/06/2022 10:00:48 AM By: Lenda Kelp PA-C Entered By: Lenda Kelp on 04/06/2022 10:00:47 -------------------------------------------------------------------------------- HPI Details Patient Name: Date of Service: Jeremy [name], Jeremy [name] 04/06/2022 9:45 A M Medical Record Number: [id number] Patient Account Number: [id number] Date of Birth/Sex: Treating RN: 05/26/1956 (66 y.o. [name] Primary Care Provider: Barbette Reichmann Other Clinician: [name] Referring Provider: Treating Provider/Extender: Jearld Shines Weeks in Treatment: 30 History of Present Illness HPI Description: 08/18/2020 upon evaluation today patient presents for initial inspection here in our clinic concerning issues has been having with his right lateral ankle and this has been present for at least a year he tells me. He is a patient of Dr. Orland Jarred. Subsequently Dr. Orland Jarred has since retired hence the patient look this up and is coming here for wound care to see if we can help him out. He did have a screw pushed out of this location and since that time tells me that the screw was removed but nonetheless the hole has remained. Fortunately there does not appear to be any signs of active infection systemically at this point though it does raise the question as to whether or  not this might be a bone/hardware infection being that this has been open for so long. The patient does have a history of diabetes mellitus type 2, coronary artery disease, hypertension, and unfortunately does not seem to be doing as great as I would like to see him regard to his left ankle. 08/25/2020 upon evaluation today patient appears to be doing [name] with regard to his ankle ulcer compared to last week this is definitely smaller. With that being said it still does probe down to bone/hardware. I still think this can be appropriate for him to see the orthopedic specialist. He has not heard from them yet he tells me. 09/05/2020 upon evaluation today patient appears to be doing extremely [name] in regard to his wound all things considered. I do not see that this is gotten any worse. Unfortunately also do not think he is gotten any better. We did make a referral to orthopedics to Dr. Victorino Dike specifically but the patient tells me that he has not heard from them. That is unusual as they normally get in touch with patients fairly quickly. I will ask him if he possibly missed a phone call he tells me he will check when he gets home. Nonetheless he does not want to go to [facility] anyway which he did not tell me previously therefore we will get a see about making a referral to Dr. Logan Bores to see what he potentially could recommend for the patient as [name] I think Dr. Logan Bores is excellent and a good option here. [name], [name] ([id number]) [id number] Page 2 of 9 Readmission: 10/28/2020 upon evaluation today patient appears to be doing about the same as when I last saw him. He did not come back for follow-up last time I saw him was 09/05/2020. Basically he tells me that he has been attempting to manage this on  his own using the Hydrofera Blue rope. He did get a call from [facility] but they did not actually get him scheduled due to the fact that the patient told me that he told  them he did not wanted to see them he just wanted to come back and see me because he was happy with my care. Nonetheless as I explained to the patient he has a fractured screw at the site of the wound he also has another screw where there appears to be some loosening of the screw and potential for hardware/bone infection here. Subsequently I think he really does need to see a specialist to see what they can do to help him out I do not believe him to be able to get this healed without taking care of the hardware issues. 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 He has been advised previous of a need for an above-knee amputation due to some of the aggressive breakdown in the ankle region. Nonetheless he has had the removal of the screw performed in office with Dr. Excell Seltzer and this was on 08/28/2021. Subsequently the patient is a poor historian he also gets upset very easily. Of note I do believe that he is going to require some dressings to be packed into the area due to the fact that to be honest this is still tracking all the way straight down to bone where there was pus and purulence coming out once I did get down to that area. Subsequently he tells me that he is having pain although it does seem to be doing better since the screw was removed. 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 Therefore his best options probably do entail the continue following up by Dr. Sampson Goon for infectious disease coupled with wound care as best we can and try to see if we keep this clean as much as possible and keep it from becoming more infected. At some point there may be a time where this could continually get worse and may end up not doing nearly as [name] but right now he  does not seem to be doing too poorly and I think this is the best way to support him. I am not overly confident that this is ever getting heal however. 09/15/21 upon evaluation today patient appears to be doing [name] with regard to his wound. He has been tolerating the dressing changes without complication. Fortunately I do not see any signs of active infection at this time. Overall I think that he is doing quite [name]. 09-22-2021 upon evaluation today patient appears to be doing about the same in regard to his ankle. 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 He again has been told that there is probably not a likely scenario in which this is able to heal. Nonetheless he is not a HBO candidate due to low ejection fraction. He does see Dr. Sampson Goon he still taking antibiotics at this point we will try to keep the area open so does not develop an abscess. 09-29-2021 upon evaluation today patient appears to be doing [name] with regard to his wound all things considered. He continues to use the [employer] [place]  rope. This is still a fairly deep wound that I think the rope is doing [name] to help keep the drainage from collecting in the base of the wound there does appear to be less drainage definitely not as purulent as what we have noticed in the past. There is no increased pain and no signs of worsening in general. 10-06-2021 upon evaluation today patient's wound is continuing to show signs of issues here at this point. Fortunately I do not see any evidence of active infection locally or systemically which is great news but at the same time he is definitely having some ongoing problems here currently. 5/8; 2-week follow-up. Right medial ankle. Small wound purulent drainage. He is on doxycycline chronically for underlying osteomyelitis. He has hardware in this area. He is using Hydrofera Blue packing strips 11-02-2021 upon  evaluation today patient appears to be doing much better in regard to his wound I am very pleased to see this. I do not see any evidence of active infection locally nor systemically which is great news. No fevers, chills, nausea, vomiting, or diarrhea. 11-16-2021 upon evaluation today patient appears to be doing [name] with regard to his ankle ulcer this is a very tiny area remaining at this point. Fortunately I do not see any evidence of infection locally or systemically at this time. Overall I think we are on the right track. 11-30-2021 Upon inspection patient's wound bed actually showed signs of having just a very small opening that still between 1 to 2 mm max. The back end of the sterile Q-tip stick I was able to get down in here and it does still probe down to bone. With that being said he still has drainage as [name]. Nonetheless he has not wanted any further surgical intervention and so mainly is just more of something were doing to try to manage this as best we can do only thing really were doing a split Hydrofera Blue externally has a lot of new skin covering over where previously had a lot of open area but at the same time I am still concerned about the fact this probes all the way down to bone. 12-14-2021 upon evaluation today patient appears to be doing about the same in regard to his wound. This still actually probes down to bone. He has not seen ID anytime recently. Has been off of the doxycycline for quite some time. For that reason I really feel like we may need to see about getting something done a little bit more specific for him here and when to try to obtain a culture today so that we can see what that shows and maybe get him on antibiotics that could help this to heal faster. He is in agreement with that plan. 12-28-2021 upon evaluation today patient appears to be doing worse in regard to his wound. Unfortunately this has sealed up and does appear to be trapping quite a bit of fluid  underneath the skin the skin is loose and honestly needs to be trimmed away I discussed that with the patient today. He voiced understanding and did consent to the debridement. This is good to be done with scissors and forceps. 01-11-2022 upon evaluation today patient's wound actually is showing signs of being a little better than last time I saw him. I do believe that the doxycycline has been effective in calming this down to some degree. Fortunately I do not see any evidence of active infection at this time systemically although locally he is actually having  some signs of cellulitis though this is much better compared to 2 weeks ago when I last saw him. 01-25-2022 upon evaluation today patient appears to be doing [name] with regard to his foot ulcer all things considered. He still has a wound that goes deeper I think switching back to the [facility] rope is probably can to be better for him at this point. 02-08-2022 upon evaluation today patient appears to be doing [name] currently in regard to his wound all things considered. This is still chronic osteomyelitis issue at least from the previous evaluations. He has previously been on antibiotics for an extended period of time. Right now I have a Mannam which I started back on July 17 and that was for 2 refills he is still taking that. There was an initial 30-day supply. That should actually carry him from July 17 to October 17. With that being said I am really not sure that we are seeing a whole lot of improvement we seem to have some waxing and waning but would not ever really completely seeing this healed unfortunately. I do think the Hydrofera Blue rope is the best way to go however this still is going all the way down to bone and that has me concerned. I discussed this with the patient before but he really does not want to take any further surgical intervention he mainly is just wanting to try let this heal its own way and he is happy with the way  things are going at the moment. 02-23-2022 upon evaluation today patient appears to be doing [name] currently in regard to his wound all things considered. He still taking the doxycycline which I do have him on long-term in order to keep this under control. He was post to be on it by infectious disease but when that ran out I continued this as he has continued and ongoing chronic osteomyelitis of the ankle. He has been recommended amputation but he does not want to proceed with that. He also is not a HBO candidate at this point. He also tells me that he really feels like it is "doing better even though the size everything is really about the same there is no significant improvement in general here. 03-09-2022 upon evaluation today patient appears to be doing decently [name] in regard to his wound. This is still probing down to bone however. Again we discussed treatment options which have always included amputation and IV antibiotics but which the patient has declined in the past. Subsequently we have had him on oral antibiotics for some time and this does seem to help to a degree. With that being said I do not see any evidence of infection locally or systemically right now although he does still have some drainage this is not doing nearly as poorly as what it had been in the past. With that being said I do think that doing an updated x-ray would not be a bad idea based on where we stand currently as [name]. That was discussed with the patient today. 03-23-2022 upon evaluation today patient's wound actually is showing signs of doing about the same in regard to the ankle region. Fortunately there does not appear to be any signs of active infection locally nor systemically at this time which is great news. With that being said I did review his x-rays and in fact [place], Jeremy [name] ([id number]) [id number] Page 3 of 9 reviewed the actual images as [name]. What I found was that I am concerned  about  the hardware loosening and to be honest I think that this is something that he probably needs to have either revision or removal of the hardware in general in order to try to allow this wound to heal with the hardware there and I feel like going to continue to have issues to be perfectly honest. I discussed that with him today and I would like for him to see Dr. Sanjuana Letters who is a local podiatrist whom I trust to see what he suggest about any possibility of infection from the hardware and whether or not the hardware loosening could be potentially a cause here and something that could be fixed with intervention. 04-06-2022 upon evaluation today patient unfortunately has continued to have this waxing and waning as far as the wound size today it had somewhat scabbed over and there was a lot of fluid underneath and when this open the wound is again larger today compared to what it was last time I saw him. Again I do believe this likely represents an infected hardware/potential osteomyelitis type issue and I discussed that with him today. He wanted to see his x-rays which I did show him today in order to help him understand what we were dealing with and how things were internally. With that being said I explained that I am not a surgeon nor a radiologist. Nonetheless I have looked at a fair number of x-rays and I do believe that the potential for the fractured screw that is remaining and there may be a site of infection or to be honest any of the other hardware. Therefore I did want him to see a surgeon to discuss options and that is why I did send him to Dr. Sanjuana Letters who he is supposed to be seeing on Friday. Electronic Signature(s) Signed: 04/06/2022 6:18:47 PM By: Lenda Kelp PA-C Entered By: Lenda Kelp on 04/06/2022 18:18:47 -------------------------------------------------------------------------------- Physical Exam Details Patient Name: Date of Service: Jeremy [name], Jeremy [name]  04/06/2022 9:45 A M Medical Record Number: [id number] Patient Account Number: [id number] Date of Birth/Sex: Treating RN: September 24, 1955 (66 y.o. [name] Primary Care Provider: Barbette Reichmann Other Clinician: [name] Referring Provider: Treating Provider/Extender: Jearld Shines Weeks in Treatment: 30 Constitutional [name]-nourished and [name]-hydrated in no acute distress. Respiratory normal breathing without difficulty. Psychiatric this patient is able to make decisions and demonstrates good insight into disease process. Alert and Oriented x 3. pleasant and cooperative. Notes Patient's wound currently showed signs of again an open wound with purulent drainage noted I still feel like that we are likely looking at a hardware issue here and to be honest I really feel like that this is not doing nearly as [name] as would like to see I am unsure of how to proceed and that is why did send him to the podiatrist for an evaluation and opinion with regard to what we are seeing. Electronic Signature(s) Signed: 04/06/2022 6:19:13 PM By: Lenda Kelp PA-C Entered By: Lenda Kelp on 04/06/2022 18:19:13 -------------------------------------------------------------------------------- Physician Orders Details Patient Name: Date of Service: Jeremy [name], Jeremy [name] 04/06/2022 9:45 A M Medical Record Number: [id number] Patient Account Number: [id number] Jeremy, [name] ([id number]) [phone].pdf Page 4 of 9 Date of Birth/Sex: Treating RN: 12-16-1955 (66 y.o. 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 Bathing/ Shower/ Hygiene May shower; gently cleanse wound with antibacterial soap, rinse and pat dry prior to dressing wounds No tub bath. Edema Control - Lymphedema / Segmental Compressive Device / Other Elevate leg(s) parallel to the floor when sitting. DO YOUR BEST to sleep in the bed at night. DO NOT sleep in your recliner. Long hours of sitting in a recliner leads to swelling of the legs and/or potential wounds on your backside. Additional Orders / Instructions Follow Nutritious Diet and Increase Protein Intake - monitor blood glucose to maintain normal level Culture obtained today Medications-Please add to medication list. ntibiotics - continue Doxycycline P.O. A Wound Treatment Wound #3 - Ankle Wound Laterality: Right, Lateral Cleanser: Normal Saline 1 x Per Day/30 Days Discharge Instructions: Wash your hands with soap and water. Remove old dressing, discard into plastic bag and place into trash. Cleanse the wound with Normal Saline prior to applying a clean dressing using gauze sponges, not tissues or cotton balls. Do not scrub or use excessive force. Pat dry using gauze sponges, not tissue or cotton balls. Prim Dressing: Hydrofera Blue Classic Foam Rope Dressing, 9x6 (mm/in) ary 1 x Per Day/30 Days Discharge Instructions: cut it a point and place into wound Secondary Dressing: T Adhesive [employer], 4x4 (in/in) 1 x Per Day/30 Days elfa Discharge Instructions: Apply over dressing to secure in place. Patient Medications llergies: No Known Allergies A Notifications Medication Indication Start End 04/12/2022 doxycycline hyclate DOSE 1 - oral 100 mg capsule - 1 capsule oral taken 2 times per day for 30  days Electronic Signature(s) Signed: 04/12/2022 4:38:04 PM By: Lenda Kelp PA-C Previous Signature: 04/06/2022 6:28:37 PM Version By: Lenda Kelp PA-C Previous Signature: 04/08/2022 7:57:14 AM Version By: [name] Entered By: Lenda Kelp on 04/12/2022 16:38:03 Jeremy [name], Jeremy [name] ([id number]) [id number] Page 5 of 9 -------------------------------------------------------------------------------- Problem List Details Patient Name: Date of Service: Jeremy [name], [place] [name] 04/06/2022 9:45 A M Medical Record Number: [id number] Patient Account Number: [id number] Date of Birth/Sex: Treating RN: 07-May-1956 (65 y.o. 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 without 09/07/2021 No Yes evidence of necrosis E11.622 Type 2 diabetes mellitus with other skin ulcer 09/07/2021 No Yes I10 Essential (primary) hypertension 09/07/2021 No Yes I25.10 Atherosclerotic heart disease of native coronary artery without angina pectoris 09/07/2021 No Yes Inactive Problems Resolved Problems Electronic Signature(s) Signed: 04/06/2022 10:00:43 AM By: Lenda Kelp PA-C Entered By: Lenda Kelp on 04/06/2022 10:00:43 -------------------------------------------------------------------------------- Progress Note Details Patient Name: Date of Service: Jeremy Tillmans [name], Jeremy [name] 04/06/2022 9:45 A M Medical Record Number: [id number] Patient Account Number: [id number] Date of Birth/Sex: Treating RN: 08/17/1955 (66 y.o. [name] [place], [place] ([id number]) [id number] Page 6  of 9 Primary Care Provider: Barbette Reichmann Other Clinician: [name] Referring Provider: Treating Provider/Extender: Jearld Shines Weeks in Treatment: 30 Subjective Chief Complaint Information obtained from Patient Right ankle ulcer History of Present Illness (  HPI) 08/18/2020 upon evaluation today patient presents for initial inspection here in our clinic concerning issues has been having with his right lateral ankle and this has been present for at least a year he tells me. He is a patient of Dr. Elvina Mattes. Subsequently Dr. Elvina Mattes has since retired hence the patient look this up and is coming here for wound care to see if we can help him out. He did have a screw pushed out of this location and since that time tells me that the screw was removed but nonetheless the hole has remained. Fortunately there does not appear to be any signs of active infection systemically at this point though it does raise the question as to whether or not this might be a bone/hardware infection being that this has been open for so long. The patient does have a history of diabetes mellitus type 2, coronary artery disease, hypertension, and unfortunately does not seem to be doing as great as I would like to see him regard to his left ankle. 08/25/2020 upon evaluation today patient appears to be doing [name] with regard to his ankle ulcer compared to last week this is definitely smaller. With that being said it still does probe down to bone/hardware. I still think this can be appropriate for him to see the orthopedic specialist. He has not heard from them yet he tells me. 09/05/2020 upon evaluation today patient appears to be doing extremely [name] in regard to his wound all things considered. I do not see that this is gotten any worse. Unfortunately also do not think he is gotten any better. We did make a referral to orthopedics to Dr. Doran Durand specifically but the patient tells me that he has not heard  from them. That is unusual as they normally get in touch with patients fairly quickly. I will ask him if he possibly missed a phone call he tells me he will check when he gets home. Nonetheless he does not want to go to [facility] anyway which he did not tell me previously therefore we will get a see about making a referral to Dr. Amalia Hailey to see what he potentially could recommend for the patient as [name] I think Dr. Amalia Hailey is excellent and a good option here. Readmission: 10/28/2020 upon evaluation today patient appears to be doing about the same as when I last saw him. He did not come back for follow-up last time I saw him was 09/05/2020. Basically he tells me that he has been attempting to manage this on his own using the Hydrofera Blue rope. He did get a call from [facility] but they did not actually get him scheduled due to the fact that the patient told me that he told them he did not wanted to see them he just wanted to come back and see me because he was happy with my care. Nonetheless as I explained to the patient he has a fractured screw at the site of the wound he also has another screw where there appears to be some loosening of the screw and potential for hardware/bone infection here. Subsequently I think he really does need to see a specialist to see what they can do to help him out I do not believe him to be able to get this healed without taking care of the hardware issues. 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 He has been advised previous of  a need for an above-knee amputation due to some of the aggressive breakdown in the ankle region. Nonetheless he has had the removal of the screw performed in office with Dr. Excell Seltzer and this was on 08/28/2021. Subsequently the patient is a poor historian he also gets upset very easily. Of note I do believe that he is going to require some  dressings to be packed into the area due to the fact that to be honest this is still tracking all the way straight down to bone where there was pus and purulence coming out once I did get down to that area. Subsequently he tells me that he is having pain although it does seem to be doing better since the screw was removed. 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 Therefore his best options probably do entail the continue following up by Dr. Sampson Goon for infectious disease coupled with wound care as best we can and try to see if we keep this clean as much as possible and keep it from becoming more infected. At some point there may be a time where this could continually get worse and may end up not doing nearly as [name] but right now he does not seem to be doing too poorly and I think this is the best way to support him. I am not overly confident that this is ever getting heal however. 09/15/21 upon evaluation today patient appears to be doing [name] with regard to his wound. He has been tolerating the dressing changes without complication. Fortunately I do not see any signs of active infection at this time. Overall I think that he is doing quite [name]. 09-22-2021 upon evaluation today patient appears to be doing about the same in regard to his ankle. 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 He again has been told that there is probably not a likely scenario in which this is able to heal. Nonetheless he is not a HBO candidate due to low ejection fraction. He does see Dr. Sampson Goon he still taking antibiotics at this point we will try to keep the area open so does not develop an abscess. 09-29-2021 upon evaluation today patient appears to be doing [name] with regard to his wound all things  considered. He continues to use the [employer] rope. This is still a fairly deep wound that I think the rope is doing [name] to help keep the drainage from collecting in the base of the wound there does appear to be less drainage definitely not as purulent as what we have noticed in the past. There is no increased pain and no signs of worsening in general. 10-06-2021 upon evaluation today patient's wound is continuing to show signs of issues here at this point. Fortunately I do not see any evidence of active infection locally or systemically which is great news but at the same time he is definitely having some ongoing problems here currently. 5/8; 2-week follow-up. Right medial ankle. Small wound purulent drainage. He is on doxycycline chronically for underlying osteomyelitis. He has hardware in this area. He is using Hydrofera Blue packing strips 11-02-2021 upon evaluation today patient appears to be doing much better in regard to his wound I am very pleased to see this. I do not see any evidence  of active infection locally nor systemically which is great news. No fevers, chills, nausea, vomiting, or diarrhea. 11-16-2021 upon evaluation today patient appears to be doing [name] with regard to his ankle ulcer this is a very tiny area remaining at this point. Fortunately I do not see any evidence of infection locally or systemically at this time. Overall I think we are on the right track. 11-30-2021 Upon inspection patient's wound bed actually showed signs of having just a very small opening that still between 1 to 2 mm max. The back end of the sterile Q-tip stick I was able to get down in here and it does still probe down to bone. With that being said he still has drainage as [name]. Nonetheless he has not wanted any further surgical intervention and so mainly is just more of something were doing to try to manage this as best we can do only thing really were doing a split Hydrofera Blue externally has a lot of  new skin covering over where previously had a lot of open area but at the same time I am still concerned about the fact this probes all the way down to bone. 12-14-2021 upon evaluation today patient appears to be doing about the same in regard to his wound. This still actually probes down to bone. He has not seen ID anytime recently. Has been off of the doxycycline for quite some time. For that reason I really feel like we may need to see about getting something done a little bit more specific for him here and when to try to obtain a culture today so that we can see what that shows and maybe get him on antibiotics that could help this to heal faster. He is in agreement with that plan. 12-28-2021 upon evaluation today patient appears to be doing worse in regard to his wound. Unfortunately this has sealed up and does appear to be trapping quite a bit of fluid underneath the skin the skin is loose and honestly needs to be trimmed away I discussed that with the patient today. He voiced Jeremy [name], Jeremy [name] ([id number]) [id number] Page 7 of 9 understanding and did consent to the debridement. This is good to be done with scissors and forceps. 01-11-2022 upon evaluation today patient's wound actually is showing signs of being a little better than last time I saw him. I do believe that the doxycycline has been effective in calming this down to some degree. Fortunately I do not see any evidence of active infection at this time systemically although locally he is actually having some signs of cellulitis though this is much better compared to 2 weeks ago when I last saw him. 01-25-2022 upon evaluation today patient appears to be doing [name] with regard to his foot ulcer all things considered. He still has a wound that goes deeper I think switching back to the [facility] rope is probably can to be better for him at this point. 02-08-2022 upon evaluation today patient appears to be doing [name]  currently in regard to his wound all things considered. This is still chronic osteomyelitis issue at least from the previous evaluations. He has previously been on antibiotics for an extended period of time. Right now I have a Mannam which I started back on July 17 and that was for 2 refills he is still taking that. There was an initial 30-day supply. That should actually carry him from July 17 to October 17. With that being said I am really not sure that  we are seeing a whole lot of improvement we seem to have some waxing and waning but would not ever really completely seeing this healed unfortunately. I do think the Hydrofera Blue rope is the best way to go however this still is going all the way down to bone and that has me concerned. I discussed this with the patient before but he really does not want to take any further surgical intervention he mainly is just wanting to try let this heal its own way and he is happy with the way things are going at the moment. 02-23-2022 upon evaluation today patient appears to be doing [name] currently in regard to his wound all things considered. He still taking the doxycycline which I do have him on long-term in order to keep this under control. He was post to be on it by infectious disease but when that ran out I continued this as he has continued and ongoing chronic osteomyelitis of the ankle. He has been recommended amputation but he does not want to proceed with that. He also is not a HBO candidate at this point. He also tells me that he really feels like it is "doing better even though the size everything is really about the same there is no significant improvement in general here. 03-09-2022 upon evaluation today patient appears to be doing decently [name] in regard to his wound. This is still probing down to bone however. Again we discussed treatment options which have always included amputation and IV antibiotics but which the patient has declined in the past.  Subsequently we have had him on oral antibiotics for some time and this does seem to help to a degree. With that being said I do not see any evidence of infection locally or systemically right now although he does still have some drainage this is not doing nearly as poorly as what it had been in the past. With that being said I do think that doing an updated x-ray would not be a bad idea based on where we stand currently as [name]. That was discussed with the patient today. 03-23-2022 upon evaluation today patient's wound actually is showing signs of doing about the same in regard to the ankle region. Fortunately there does not appear to be any signs of active infection locally nor systemically at this time which is great news. With that being said I did review his x-rays and in fact reviewed the actual images as [name]. What I found was that I am concerned about the hardware loosening and to be honest I think that this is something that he probably needs to have either revision or removal of the hardware in general in order to try to allow this wound to heal with the hardware there and I feel like going to continue to have issues to be perfectly honest. I discussed that with him today and I would like for him to see Dr. Sanjuana Letters who is a local podiatrist whom I trust to see what he suggest about any possibility of infection from the hardware and whether or not the hardware loosening could be potentially a cause here and something that could be fixed with intervention. 04-06-2022 upon evaluation today patient unfortunately has continued to have this waxing and waning as far as the wound size today it had somewhat scabbed over and there was a lot of fluid underneath and when this open the wound is again larger today compared to what it was last time I saw him.  Again I do believe this likely represents an infected hardware/potential osteomyelitis type issue and I discussed that with him today. He wanted to  see his x-rays which I did show him today in order to help him understand what we were dealing with and how things were internally. With that being said I explained that I am not a surgeon nor a radiologist. Nonetheless I have looked at a fair number of x-rays and I do believe that the potential for the fractured screw that is remaining and there may be a site of infection or to be honest any of the other hardware. Therefore I did want him to see a surgeon to discuss options and that is why I did send him to Dr. Sanjuana LettersBrett [name] who he is supposed to be seeing on Friday. Objective Constitutional [name]-nourished and [name]-hydrated in no acute distress. Vitals Time Taken: 10:06 AM, Height: 71 in, Weight: 250 lbs, BMI: 34.9, Temperature: 98.0 F, Pulse: 112 bpm, Respiratory Rate: 16 breaths/min, Blood Pressure: 141/93 mmHg. Respiratory normal breathing without difficulty. Psychiatric this patient is able to make decisions and demonstrates good insight into disease process. Alert and Oriented x 3. pleasant and cooperative. General Notes: Patient's wound currently showed signs of again an open wound with purulent drainage noted I still feel like that we are likely looking at a hardware issue here and to be honest I really feel like that this is not doing nearly as [name] as would like to see I am unsure of how to proceed and that is why did send him to the podiatrist for an evaluation and opinion with regard to what we are seeing. Integumentary (Hair, Skin) Wound #3 status is Open. Original cause of wound was Gradually Appeared. The date acquired was: 08/24/2021. The wound has been in treatment 30 weeks. The wound is located on the Right,Lateral Ankle. The wound measures 0.3cm length x 0.3cm width x 1.7cm depth; 0.071cm^2 area and 0.12cm^3 volume. There is bone and Fat Layer (Subcutaneous Tissue) exposed. There is no tunneling noted, however, there is undermining starting at 12:00 and ending at 12:00 with  a maximum distance of 1cm. There is a medium amount of purulent drainage noted. There is medium (34-66%) pink granulation within the wound bed. There is a small (1-33%) amount of necrotic tissue within the wound bed including Adherent Slough. 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 Bathing/ Shower/ Hygiene: May shower; gently cleanse wound with antibacterial soap, rinse and pat dry prior to dressing wounds No tub bath. Edema Control - Lymphedema / Segmental Compressive Device / Other: Elevate leg(s) parallel to the floor when sitting. DO YOUR BEST to sleep in the bed at night. DO NOT sleep in your recliner. Long hours of sitting in a recliner leads to swelling of the legs and/or potential wounds on your backside. Additional Orders / Instructions: Follow Nutritious Diet and Increase Protein Intake - monitor blood glucose to maintain normal level Culture obtained today Medications-Please add to medication list.: P.O. Antibiotics - continue Doxycycline The following medication(s) was prescribed: doxycycline hyclate oral 100 mg capsule 1 1 capsule oral taken 2 times per day for 30 days starting 04/12/2022 WOUND #3: - Ankle  Wound Laterality: Right, Lateral Cleanser: Normal Saline 1 x Per Day/30 Days Discharge Instructions: Wash your hands with soap and water. Remove old dressing, discard into plastic bag and place into trash. Cleanse the wound with Normal Saline prior to applying a clean dressing using gauze sponges, not tissues or cotton balls. Do  not scrub or use excessive force. Pat dry using gauze sponges, not tissue or cotton balls. Prim Dressing: Hydrofera Blue Classic Foam Rope Dressing, 9x6 (mm/in) 1 x Per Day/30 Days ary Discharge Instructions: cut it a point and place into wound Secondary Dressing: T Adhesive [employer], 4x4 (in/in) 1 x Per Day/30 Days elfa Discharge Instructions: Apply over dressing to secure in place. 1. Based on what I see again I do believe that the ideal thing is to have him see tree to see if there is anything Dr. Logan Bores feels can be done or where things stand. Again he may require some additional imaging but I Ernie Hew leave this to Dr. Logan Bores to see what would be the best imaging. 2. I am also can recommend that we have the patient continue to monitor for any signs of infection. Obviously if anything worsens systemically she let me know at this point I still do have him on doxycycline which he tells me still not out of yet and again this has been trying to keep the infection under control over the period of time since I been seeing him. We will see patient back for reevaluation in 2 weeks here in the clinic. If anything worsens or changes patient will contact our office for additional recommendations. Electronic Signature(s) Signed: 04/12/2022 4:38:15 PM By: Lenda Kelp PA-C Previous Signature: 04/06/2022 6:20:08 PM Version By: Lenda Kelp PA-C Entered By: Lenda Kelp on 04/12/2022 16:38:14 -------------------------------------------------------------------------------- SuperBill Details Patient Name: Date of Service: Jeremy Thayer [name], Jeremy [name] 04/06/2022 Medical Record Number: [id number] Patient Account Number: [id number] Date of Birth/Sex: Treating RN: Nov 25, 1955 (66 y.o. 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 Jeremy [name], Jeremy [name] ([id number]) [id number] Page 9 of 9 E11.622 Type 2 diabetes mellitus with other skin ulcer I10 Essential (primary) hypertension I25.10 Atherosclerotic heart disease of native coronary artery without angina pectoris Facility Procedures : CPT4 Code: [id number] Description: 99213 - WOUND CARE VISIT-LEV 3 EST PT Modifier: Quantity: 1 Physician Procedures : CPT4 Code Description Modifier [id number] 99214 - WC PHYS LEVEL 4 - EST PT ICD-10 Diagnosis Description M86.471 Chronic osteomyelitis with draining sinus, right ankle and foot L97.316 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

## 2022-04-09 ENCOUNTER — Ambulatory Visit: Payer: Medicaid Other | Admitting: Podiatry

## 2022-04-16 ENCOUNTER — Encounter: Payer: Self-pay | Admitting: Podiatry

## 2022-04-16 ENCOUNTER — Ambulatory Visit: Payer: Medicaid Other | Admitting: Podiatry

## 2022-04-16 DIAGNOSIS — Z969 Presence of functional implant, unspecified: Secondary | ICD-10-CM

## 2022-04-16 DIAGNOSIS — L97312 Non-pressure chronic ulcer of right ankle with fat layer exposed: Secondary | ICD-10-CM

## 2022-04-16 NOTE — Progress Notes (Signed)
Chief Complaint  Patient presents with   Foot Ulcer    Lateral ankle right - venous stasis ulcer x several months, wound center has been treating, using hydrofera blue and covering every other day, seems better, wound center referred here for treatment   Diabetes    Last A1C was 8.7   New Patient (Initial Visit)    Subjective:  66 y.o. male with PMHx of diabetes mellitus presenting today as a referral from the [facility] wound care center for evaluation of an ulcer to the lateral aspect of the ankle.  Patient has a history of ORIF right ankle in 2002 at [facility].  This reported history is from the patient.  Patient states that the surgery did not do well and he has had a wound to the lateral aspect of the right ankle ever since the surgery over 20 years ago.  The wound probes to bone.  He has been treated at the [facility] wound care center who referred him here  Patient is a smoker half pack per day x 46 years   Past Medical History:  Diagnosis Date   Coronary artery disease    Diabetes mellitus without complication ([place])    Hypertension    S/P CABG x 1     Past Surgical History:  Procedure Laterality Date   ANKLE ARTHROSCOPY W/ OPEN REPAIR Right    CORONARY ARTERY BYPASS GRAFT     PULMONARY THROMBECTOMY Bilateral 12/24/2020   Procedure: PULMONARY THROMBECTOMY;  Surgeon: Algernon Huxley, MD;  Location: [place] [facility];  Service: Cardiovascular;  Laterality: Bilateral;    Allergies  Allergen Reactions   Amoxicillin Swelling   Keflex [Cephalexin] Rash     Objective/Physical Exam General: The patient is alert and oriented x3 in no acute distress.  Dermatology:  Wound #1 noted to the lateral aspect of the right ankle approximately 0.6 x 0.6 x 1.2 cm (LxWxD).  There is direct probing to hardware of the right ankle  To the noted ulceration(s), there is no eschar. There is a moderate amount of slough, fibrin, and necrotic tissue noted.  Serosanguineous drainage noted.  The wound does  probe to metal hardware and bone  Vascular: Chronic venous stasis with edema bilateral lower extremities  Neurological: Epicritic and protective threshold diminished bilaterally.   Musculoskeletal Exam: History of ORIF RT ankle  Radiographic exam RT ankle 03/09/2022: IMPRESSION: 1. Interval removal of an inter fragmentary screw from the distal fibula. 2. Interval increase in size of a shallow ulceration lateral to the distal fibular metadiaphysis. No associated osseous erosion to suggest osteomyelitis. 3. Tibiotalar arthrodesis.  Assessment: 1.  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 diabetes mellitus w/ peripheral neuropathy 3.  Exposed hardware within the ulcer    Plan of Care:  1. Patient was evaluated. 2.  After evaluating the patient I do recommend removal of hardware.  Hardware would need to be removed and sent for culture.  Exposed hardware for several years.  Patient at high risk of limb loss if the wound does not heal and hardware is not removed.  Patient would benefit from hardware removal.  Risk benefits advantages and disadvantages explained in detail.  Postoperative recovery course also explained.  Patient understands but would not like to have any surgery until after the new year. 3.  Continue management at the [facility] wound care center 4.  Return to clinic after the new year for reevaluation and surgical consult for hardware removal.  Return to clinic sooner or go to the ED immediately if he starts to notice systemic symptoms of infection   Felecia Shelling, DPM [facility]  Dr. Felecia Shelling, DPM    [address] [place], [place] [address]                Office [phone]  Fax [phone]

## 2022-04-20 ENCOUNTER — Encounter: Payer: Medicaid Other | Attending: Physician Assistant | Admitting: Internal Medicine

## 2022-04-20 DIAGNOSIS — E1169 Type 2 diabetes mellitus with other specified complication: Secondary | ICD-10-CM | POA: Diagnosis present

## 2022-04-20 DIAGNOSIS — I10 Essential (primary) hypertension: Secondary | ICD-10-CM | POA: Diagnosis not present

## 2022-04-20 DIAGNOSIS — L97316 Non-pressure chronic ulcer of right ankle with bone involvement without evidence of necrosis: Secondary | ICD-10-CM | POA: Insufficient documentation

## 2022-04-20 DIAGNOSIS — I2510 Atherosclerotic heart disease of native coronary artery without angina pectoris: Secondary | ICD-10-CM | POA: Insufficient documentation

## 2022-04-20 DIAGNOSIS — M86471 Chronic osteomyelitis with draining sinus, right ankle and foot: Secondary | ICD-10-CM | POA: Insufficient documentation

## 2022-04-20 DIAGNOSIS — L987 Excessive and redundant skin and subcutaneous tissue: Secondary | ICD-10-CM | POA: Diagnosis not present

## 2022-04-20 DIAGNOSIS — E11621 Type 2 diabetes mellitus with foot ulcer: Secondary | ICD-10-CM | POA: Insufficient documentation

## 2022-04-21 NOTE — Progress Notes (Signed)
Jeremy [name], Jeremy [name] ([id number]) [id number] Page 1 of 9 Visit Report for 04/20/2022 Arrival Information Details Patient Name: Date of Service: Jeremy [name], [place] [name] 04/20/2022 9:15 A M Medical Record Number: [id number] Patient Account Number: [id number] Date of Birth/Sex: Treating RN: [birth year] (66 y.o. Jeremy [name] Primary Care Valor [place]: Barbette Reichmann Other Clinician: Referring Lannis Lichtenwalner: Treating Paola Flynt/Extender: RO BSO N, MICHA EL G Hande, Vishwanath Weeks in Treatment: 32 Visit Information History Since Last Visit All ordered tests and consults were completed: No Patient Arrived: Ambulatory Added or deleted any medications: No Arrival Time: 09:22 Any new allergies or adverse reactions: No Accompanied By: self Had a fall or experienced change in No Transfer Assistance: None activities of daily living that may affect Patient Identification Verified: Yes risk of falls: Secondary Verification Process Completed: Yes Signs or symptoms of abuse/neglect since last visito No Patient Requires Transmission-Based Precautions: No Hospitalized since last visit: No Patient Has Alerts: Yes Implantable device outside of the clinic excluding No Patient Alerts: Patient on Blood Thinner cellular tissue based products placed in the center Eliquis since last visit: Pain Present Now: No Electronic Signature(s) Signed: 04/20/2022 5:05:18 PM By: Midge Aver MSN RN CNS WTA Entered By: Midge Aver on 04/20/2022 09:26:42 -------------------------------------------------------------------------------- Clinic Level of Care Assessment Details Patient Name: Date of Service: Jeremy [name], Jeremy [name] 04/20/2022 9:15 A M Medical Record Number: [id number] Patient Account Number: [id number] Date of Birth/Sex: Treating RN: 05-Feb-1956 (66 y.o. Jeremy [name] Primary Care Deondrea Markos: Barbette Reichmann Other Clinician: Referring Darya Bigler: Treating Joaopedro Eschbach/Extender: RO BSO N, MICHA EL  G Hande, Vishwanath Weeks in Treatment: 32 Clinic Level of Care Assessment Items TOOL 4 Quantity Score X- 1 0 Use when only an EandM is performed on FOLLOW-UP visit ASSESSMENTS - Nursing Assessment / Reassessment X- 1 10 Reassessment of Co-morbidities (includes updates in patient status) X- 1 5 Reassessment of Adherence to Treatment Plan Jeremy [name], Jeremy [name] ([id number]) [id number] Page 2 of 9 ASSESSMENTS - Wound and Skin A ssessment / Reassessment X - Simple Wound Assessment / Reassessment - one wound 1 5 []  - 0 Complex Wound Assessment / Reassessment - multiple wounds []  - 0 Dermatologic / Skin Assessment (not related to wound area) ASSESSMENTS - Focused Assessment []  - 0 Circumferential Edema Measurements - multi extremities []  - 0 Nutritional Assessment / Counseling / Intervention []  - 0 Lower Extremity Assessment (monofilament, tuning fork, pulses) []  - 0 Peripheral Arterial Disease Assessment (using hand held doppler) ASSESSMENTS - Ostomy and/or Continence Assessment and Care []  - 0 Incontinence Assessment and Management []  - 0 Ostomy Care Assessment and Management (repouching, etc.) PROCESS - Coordination of Care X - Simple Patient / Family Education for ongoing care 1 15 []  - 0 Complex (extensive) Patient / Family Education for ongoing care X- 1 10 Staff obtains , Records, T Results / Process Orders est []  - 0 Staff telephones HHA, Nursing Homes / Clarify orders / etc []  - 0 Routine Transfer to another Facility (non-emergent condition) []  - 0 Routine Hospital Admission (non-emergent condition) []  - 0 New Admissions / / Ordering NPWT Apligraf, etc. , []  - 0 Emergency Hospital Admission (emergent condition) X- 1 10 Simple Discharge Coordination []  - 0 Complex (extensive) Discharge Coordination PROCESS - Special Needs []  - 0 Pediatric / Minor Patient Management []  - 0 Isolation Patient  Management []  - 0 Hearing / Language / Visual special needs []  - 0 Assessment of Community assistance (transportation, D/C planning, etc.) []  - 0 Additional assistance / Altered mentation []  -  0 Support Surface(s) Assessment (bed, cushion, seat, etc.) INTERVENTIONS - Wound Cleansing / Measurement X - Simple Wound Cleansing - one wound 1 5 []  - 0 Complex Wound Cleansing - multiple wounds X- 1 5 Wound Imaging (photographs - any number of wounds) []  - 0 Wound Tracing (instead of photographs) X- 1 5 Simple Wound Measurement - one wound []  - 0 Complex Wound Measurement - multiple wounds INTERVENTIONS - Wound Dressings []  - 0 Small Wound Dressing one or multiple wounds []  - 0 Medium Wound Dressing one or multiple wounds []  - 0 Large Wound Dressing one or multiple wounds []  - 0 Application of Medications - topical []  - 0 Application of Medications - injection INTERVENTIONS - Miscellaneous []  - 0 External ear exam Jeremy [name], Jeremy [name] ([id number]) [id number] Page 3 of 9 []  - 0 Specimen Collection (cultures, biopsies, blood, body fluids, etc.) []  - 0 Specimen(s) / Culture(s) sent or taken to Lab for analysis []  - 0 Patient Transfer (multiple staff / Harrel Lemon Lift / Similar devices) []  - 0 Simple Staple / Suture removal (25 or less) []  - 0 Complex Staple / Suture removal (26 or more) []  - 0 Hypo / Hyperglycemic Management (close monitor of Blood Glucose) []  - 0 Ankle / Brachial Index (ABI) - do not check if billed separately X- 1 5 Vital Signs Has the patient been seen at the hospital within the last three years: Yes Total Score: 75 Level Of Care: New/Established - Level 2 Electronic Signature(s) Signed: 04/20/2022 5:05:18 PM By: Rosalio Loud MSN RN CNS WTA Entered By: Rosalio Loud on 04/20/2022 10:12:11 -------------------------------------------------------------------------------- Encounter Discharge Information Details Patient Name: Date of  Service: Jeremy [name], Jeremy [name] 04/20/2022 9:15 A M Medical Record Number: [id number] Patient Account Number: [id number] Date of Birth/Sex: Treating RN: 09-29-1955 (66 y.o. Jeremy [name] Primary Care [place]: Tracie Harrier Other Clinician: Referring Jersey Espinoza: Treating Joseantonio Dittmar/Extender: RO BSO N, MICHA EL G Hande, Vishwanath Weeks in Treatment: 32 Encounter Discharge Information Items Discharge Condition: Stable Ambulatory Status: Ambulatory Discharge Destination: Home Transportation: Private Auto Accompanied By: self Schedule Follow-up Appointment: Yes Clinical Summary of Care: Electronic Signature(s) Signed: 04/20/2022 5:05:18 PM By: Rosalio Loud MSN RN CNS WTA Entered By: Rosalio Loud on 04/20/2022 10:13:19 -------------------------------------------------------------------------------- Lower Extremity Assessment Details Patient Name: Date of Service: [address], Jeremy [name] 04/20/2022 9:15 A Jeremy [name], Jeremy [name] ([id number]) [id number] Page 4 of 9 Medical Record Number: [id number] Patient Account Number: [id number] Date of Birth/Sex: Treating RN: 02-01-1956 (66 y.o. Jeremy [name] Primary Care Ugochukwu Chichester: Tracie Harrier Other Clinician: Referring Allycia Pitz: Treating Lyndell Gillyard/Extender: RO BSO N, MICHA EL Katherina Right, Vishwanath Weeks in Treatment: 32 Electronic Signature(s) Signed: 04/20/2022 5:05:18 PM By: Rosalio Loud MSN RN CNS WTA Entered By: Rosalio Loud on 04/20/2022 10:10:18 -------------------------------------------------------------------------------- Multi Wound Chart Details Patient Name: Date of Service: Jeremy [name], Jeremy [name] 04/20/2022 9:15 A M Medical Record Number: [id number] Patient Account Number: [id number] Date of Birth/Sex: Treating RN: 1956/02/13 (66 y.o. Jeremy [name] Primary Care Coreon Simkins: Tracie Harrier Other Clinician: Referring Wilburt Messina: Treating Jayleen Afonso/Extender: RO BSO N, MICHA EL G Hande, Vishwanath Weeks in Treatment:  32 Vital Signs Height(in): 71 Pulse(bpm): 16 Weight(lbs): 250 Blood Pressure(mmHg): 153/115 Body Mass Index(BMI): 34.9 Temperature(F): 98.4 Respiratory Rate(breaths/min): 16 [3:Photos:] [N/A:N/A] Right, Lateral Ankle N/A N/A Wound Location: Gradually Appeared N/A N/A Wounding Event: Refractory Osteomyelitis N/A N/A Primary Etiology: Diabetic Wound/Ulcer of the Lower N/A N/A Secondary Etiology: Extremity Arrhythmia, Congestive Heart Failure, N/A N/A Comorbid History: Coronary Artery Disease, Hypertension, Type II Diabetes, Osteoarthritis, Osteomyelitis  08/24/2021 N/A N/A Date Acquired: 64 N/A N/A Weeks of Treatment: Open N/A N/A Wound Status: No N/A N/A Wound Recurrence: Yes N/A N/A Pending A mputation on Presentation: 0.5x0.5x0.9 N/A N/A Measurements L x W x D (cm) 0.196 N/A N/A A (cm) : rea 0.177 N/A N/A Volume (cm) : -532.30% N/A N/A % Reduction in A rea: -704.50% N/A N/A % Reduction in Volume: Full Thickness With Exposed Support N/A N/A Classification: Structures Medium N/A N/A Exudate Amount: Purulent N/A N/A Exudate Type: yellow, brown, green N/A N/A Exudate Color: Medium (34-66%) N/A N/A Granulation Amount: Jeremy [name], Jeremy [name] ([id number]) [id number] Page 5 of 9 Pink N/A N/A Granulation Quality: Small (1-33%) N/A N/A Necrotic Amount: Fat Layer (Subcutaneous Tissue): Yes N/A N/A Exposed Structures: Bone: Yes Fascia: No Tendon: No Muscle: No Joint: No Small (1-33%) N/A N/A Epithelialization: Treatment Notes Electronic Signature(s) Signed: 04/20/2022 5:05:18 PM By: Rosalio Loud MSN RN CNS WTA Entered By: Rosalio Loud on 04/20/2022 09:36:30 -------------------------------------------------------------------------------- Multi-Disciplinary Care Plan Details Patient Name: Date of Service: Jeremy [name], Jeremy [name] 04/20/2022 9:15 A M Medical Record Number: [id number] Patient Account Number: [id number] Date of Birth/Sex:  Treating RN: September 03, 1955 (66 y.o. Jeremy [name] Primary Care Nani Ingram: Tracie Harrier Other Clinician: Referring Charday Capetillo: Treating Rishita Petron/Extender: RO BSO N, MICHA EL G Hande, Vishwanath Weeks in Treatment: 32 Active Inactive Osteomyelitis Nursing Diagnoses: Knowledge deficit related to disease process and management Potential for infection: osteomyelitis Goals: Patient/caregiver will verbalize understanding of disease process and disease management Date Initiated: 09/07/2021 Target Resolution Date: 09/18/2021 Goal Status: Active Interventions: Assess for signs and symptoms of osteomyelitis resolution every visit Provide education on osteomyelitis Notes: Wound/Skin Impairment Nursing Diagnoses: Impaired tissue integrity Knowledge deficit related to smoking impact on wound healing Knowledge deficit related to ulceration/compromised skin integrity Goals: Patient will demonstrate a reduced rate of smoking or cessation of smoking Date Initiated: 09/07/2021 Target Resolution Date: 10/16/2021 Goal Status: Active Patient/caregiver will verbalize understanding of skin care regimen Date Initiated: 09/07/2021 Target Resolution Date: 09/18/2021 Goal Status: Active Ulcer/skin breakdown will have a volume reduction of 30% by week 4 Date Initiated: 09/07/2021 Target Resolution Date: 10/05/2021 Jeremy [name], Jeremy [name] ([id number]) [id number] Page 6 of 9 Goal Status: Active Ulcer/skin breakdown will have a volume reduction of 50% by week 8 Date Initiated: 09/07/2021 Target Resolution Date: 11/02/2021 Goal Status: Active Ulcer/skin breakdown will have a volume reduction of 80% by week 12 Date Initiated: 09/07/2021 Target Resolution Date: 11/30/2021 Goal Status: Active Ulcer/skin breakdown will heal within 14 weeks Date Initiated: 09/07/2021 Target Resolution Date: 12/14/2021 Goal Status: Active Interventions: Assess patient/caregiver ability to obtain necessary  supplies Assess patient/caregiver ability to perform ulcer/skin care regimen upon admission and as needed Assess ulceration(s) every visit Notes: Electronic Signature(s) Signed: 04/20/2022 5:05:18 PM By: Rosalio Loud MSN RN CNS WTA Entered By: Rosalio Loud on 04/20/2022 10:10:23 -------------------------------------------------------------------------------- Pain Assessment Details Patient Name: Date of Service: Jeremy [name], Jeremy [name] 04/20/2022 9:15 A M Medical Record Number: [id number] Patient Account Number: [id number] Date of Birth/Sex: Treating RN: 1956/04/06 (66 y.o. Jeremy [name] Primary Care Shelby Anderle: Tracie Harrier Other Clinician: Referring Everest Brod: Treating Parul Porcelli/Extender: RO BSO N, MICHA EL G Hande, Vishwanath Weeks in Treatment: 32 Active Problems Location of Pain Severity and Description of Pain Patient Has Paino No Site Locations Pain Management and Medication Current Pain Management: Electronic Signature(s) Signed: 04/20/2022 5:05:18 PM By: Rosalio Loud MSN RN CNS Jeremy [name], Jeremy [name] ([id number]) [id number] Page 7 of 9 Entered By: Rosalio Loud on 04/20/2022 09:27:19 -------------------------------------------------------------------------------- Patient/Caregiver Education Details Patient Name: Date  of Service: Jeremy [name], [place] RSHA [name] 11/7/2023andnbsp9:15 A M Medical Record Number: [id number] Patient Account Number: [id number] Date of Birth/Gender: Treating RN: [birth year] (66 y.o. Jeremy [name] Primary Care Physician: Tracie Harrier Other Clinician: Referring Physician: Treating Physician/Extender: RO BSO N, MICHA EL G Hande, Vishwanath Weeks in Treatment: 32 Education Assessment Education Provided To: Patient Education Topics Provided Wound/Skin Impairment: Handouts: Caring for Your Ulcer Methods: Explain/Verbal Responses: State content correctly Electronic Signature(s) Signed: 04/20/2022 5:05:18 PM By: Rosalio Loud MSN RN  CNS WTA Entered By: Rosalio Loud on 04/20/2022 10:12:34 -------------------------------------------------------------------------------- Wound Assessment Details Patient Name: Date of Service: Jeremy [name], Jeremy [name] 04/20/2022 9:15 A M Medical Record Number: [id number] Patient Account Number: [id number] Date of Birth/Sex: Treating RN: 1955-08-29 (66 y.o. Jeremy [name] Primary Care Keniel Ralston: Tracie Harrier Other Clinician: Referring Jaunice Mirza: Treating Deshan Hemmelgarn/Extender: RO BSO N, MICHA EL G Hande, Vishwanath Weeks in Treatment: 32 Wound Status Wound Number: 3 Primary Refractory Osteomyelitis Etiology: Wound Location: Right, Lateral Ankle Secondary Diabetic Wound/Ulcer of the Lower Extremity Wounding Event: Gradually Appeared Etiology: Date Acquired: 08/24/2021 Wound Open Weeks Of Treatment: 32 Status: Clustered Wound: No Comorbid Arrhythmia, Congestive Heart Failure, Coronary Artery Disease, Pending Amputation On Presentation History: Hypertension, Type II Diabetes, Osteoarthritis, Osteomyelitis Photos Jeremy [name], Irie ([id number]) [id number] Page 8 of 9 Wound Measurements Length: (cm) 0.5 Width: (cm) 0.5 Depth: (cm) 0.9 Area: (cm) 0.196 Volume: (cm) 0.177 % Reduction in Area: -532.3% % Reduction in Volume: -704.5% Epithelialization: Small (1-33%) Tunneling: No Undermining: No Wound Description Classification: Full Thickness With Exposed Suppor Exudate Amount: Medium Exudate Type: Purulent Exudate Color: yellow, brown, green t Structures Foul Odor After Cleansing: No Slough/Fibrino Yes Wound Bed Granulation Amount: Medium (34-66%) Exposed Structure Granulation Quality: Pink Fascia Exposed: No Necrotic Amount: Small (1-33%) Fat Layer (Subcutaneous Tissue) Exposed: Yes Necrotic Quality: Adherent Slough Tendon Exposed: No Muscle Exposed: No Joint Exposed: No Bone Exposed: Yes Treatment Notes Wound #3 (Ankle) Wound Laterality: Right,  Lateral Cleanser Normal Saline Discharge Instruction: Wash your hands with soap and water. Remove old dressing, discard into plastic bag and place into trash. Cleanse the wound with Normal Saline prior to applying a clean dressing using gauze sponges, not tissues or cotton balls. Do not scrub or use excessive force. Pat dry using gauze sponges, not tissue or cotton balls. Peri-Wound Care Topical Primary Dressing Hydrofera Blue Classic Foam Rope Dressing, 9x6 (mm/in) Discharge Instruction: cut it a point and place into wound Secondary [place] Dressing, 4x4 (in/in) elfa Discharge Instruction: Apply over dressing to secure in place. Secured With Compression Wrap Compression Stockings Environmental education officer) Signed: 04/20/2022 5:05:18 PM By: Rosalio Loud MSN RN CNS WTA Entered By: Rosalio Loud on 04/20/2022 09:32:55 [name] ([id number]) [id number] Page 9 of 9 -------------------------------------------------------------------------------- Vitals Details Patient Name: Date of Service: Jeremy [name], [place] [name] 04/20/2022 9:15 A M Medical Record Number: [id number] Patient Account Number: [id number] Date of Birth/Sex: Treating RN: 09-Dec-1955 (66 y.o. Jeremy [name] Primary Care Kerria Sapien: Tracie Harrier Other Clinician: Referring Buddie Marston: Treating Tahjae Durr/Extender: RO BSO N, MICHA EL G Hande, Vishwanath Weeks in Treatment: 32 Vital Signs Time Taken: 09:26 Temperature (F): 98.4 Height (in): 71 Pulse (bpm): 83 Weight (lbs): 250 Respiratory Rate (breaths/min): 16 Body Mass Index (BMI): 34.9 Blood Pressure (mmHg): 153/115 Reference Range: 80 - 120 mg / dl Electronic Signature(s) Signed: 04/20/2022 5:05:18 PM By: Rosalio Loud MSN RN CNS WTA Entered By: Rosalio Loud on 04/20/2022 09:27:11

## 2022-04-21 NOTE — Progress Notes (Signed)
Jeremy Jeremy [name], Jeremy Jeremy [name] ([id number]) [id number] Page 1 of 7 Visit Report for 04/20/2022 Chief Complaint Document Details Patient Name: Date of Service: Jeremy [facility] [name][facility] [place] [name] 04/20/2022 9:15 A M Medical Record Number: [id number] Patient Account Number: [id number] Date of Birth/Sex: Treating RN: June 04, 1956 (66 y.o. Jeremy Jeremy [name]) Smith, Vicki Primary Care Provider: Barbette ReichmannHande, Vishwanath Other Clinician: Referring Provider: Treating Provider/Extender: Jeremy Jeremy [name], Jeremy Jeremy [name] Hande, Vishwanath Weeks in Treatment: 32 Information Obtained from: Patient Chief Complaint Right ankle ulcer Electronic Signature(s) Signed: 04/20/2022 5:01:28 PM By: Baltazar Najjarobson, Shonice Wrisley MD Signed: 04/20/2022 5:05:18 PM By: Midge AverSmith, Vicki MSN RN CNS WTA Entered By: Midge AverSmith, Vicki on 04/20/2022 10:12:44 -------------------------------------------------------------------------------- HPI Details Patient Name: Date of Service: Jeremy Jeremy OhmWELL, Jeremy Jeremy [name] 04/20/2022 9:15 A M Medical Record Number: [id number] Patient Account Number: [id number] Date of Birth/Sex: Treating RN: June 04, 1956 (66 y.o. Jeremy Jeremy [name]) Smith, Vicki Primary Care Provider: Barbette ReichmannHande, Vishwanath Other Clinician: Referring Provider: Treating Provider/Extender: Jeremy Jeremy [name], Jeremy Jeremy [name] Hande, Vishwanath Weeks in Treatment: 32 History of Present Illness HPI Description: 08/18/2020 upon evaluation today patient presents for initial inspection here in our clinic concerning issues has been having with his right lateral ankle and this has been present for at least a year he tells me. He is a patient of Dr. Orland Jarredroxler. Subsequently Dr. Orland Jarredroxler has since retired hence the patient look this up and is coming here for wound care to see if we can help him out. He did have a screw pushed out of this location and since that time tells me that the screw was removed but nonetheless the hole has remained. Fortunately there does not appear to be any signs of active infection systemically at this point though it  does raise the question as to whether or not this might be a bone/hardware infection being that this has been open for so long. The patient does have a history of diabetes mellitus type 2, coronary artery disease, hypertension, and unfortunately does not seem to be doing as great as I would like to see him regard to his left ankle. 08/25/2020 upon evaluation today patient appears to be doing Jeremy [name] with regard to his ankle ulcer compared to last week this is definitely smaller. With that being said it still does probe down to bone/hardware. I still think this can be appropriate for him to see the orthopedic specialist. He has not heard from them yet he tells me. 09/05/2020 upon evaluation today patient appears to be doing extremely Jeremy [name] in regard to his wound all things considered. I do not see that this is gotten any worse. Unfortunately also do not think he is gotten any better. We did make a referral to orthopedics to Dr. Victorino DikeHewitt specifically but the patient tells me that he has not heard from them. That is unusual as they normally get in touch with patients fairly quickly. I will ask him if he possibly missed a phone call he tells me he will check when he gets home. Nonetheless [facility] which he did not tell me previously therefore we will get a see Jeremy Jeremy [name] ([id number]) [id number] Page 2 of 7 about making a referral to Dr. Logan BoresEvans to see what he potentially could recommend for the patient as Jeremy [name] I think Dr. Logan BoresEvans is excellent and a good option here. Readmission: 10/28/2020 upon evaluation today patient appears to be doing about the same as when I last saw him. He did not come back for follow-up last time I saw him was 09/05/2020.  Basically he tells me that he has been attempting to manage this on his own using the Hydrofera Blue rope. He did get a call from [facility] but they did not actually get him scheduled due to the fact  that the patient told me that he told them he did not wanted to see them he just wanted to come back and see me because he was happy with my care. Nonetheless as I explained to the patient he has a fractured screw at the site of the wound he also has another screw where there appears to be some loosening of the screw and potential for hardware/bone infection here. Subsequently I think he really does need to see a specialist to see what they can do to help him out I do not believe him to be able to get this healed without taking care of the hardware issues. 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 He has been advised previous of a need for an above-knee amputation due to some of the aggressive breakdown in the ankle region. Nonetheless he has had the removal of the screw performed in office with Dr. Excell Seltzer and this was on 08/28/2021. Subsequently the patient is a poor historian he also gets upset very easily. Of note I do believe that he is going to require some dressings to be packed into the area due to the fact that to be honest this is still tracking all the way straight down to bone where there was pus and purulence coming out once I did get down to that area. Subsequently he tells me that he is having pain although it does seem to be doing better since the screw was removed. 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 Therefore his best options probably do entail the continue following up by Dr. Sampson Goon for infectious disease coupled with wound care as best we can and try to see if we keep this clean as much as possible and keep it from becoming more infected. At some point there may be a time where this could continually get worse and may end up not  doing nearly as Jeremy [name] but right now he does not seem to be doing too poorly and I think this is the best way to support him. I am not overly confident that this is ever getting heal however. 09/15/21 upon evaluation today patient appears to be doing Jeremy [name] with regard to his wound. He has been tolerating the dressing changes without complication. Fortunately I do not see any signs of active infection at this time. Overall I think that he is doing quite Jeremy [name]. 09-22-2021 upon evaluation today patient appears to be doing about the same in regard to his ankle. 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 He again has been told that there is probably not a likely scenario in which this is able to heal. Nonetheless he is not a HBO candidate due to low ejection fraction. He does see Dr. Sampson Goon he still taking antibiotics at this point we will try to keep the area open so does not develop an abscess. 09-29-2021 upon evaluation today patient appears to be doing Jeremy [name] with regard  to his wound all things considered. He continues to use the [employer] rope. This is still a fairly deep wound that I think the rope is doing Jeremy [name] to help keep the drainage from collecting in the base of the wound there does appear to be less drainage definitely not as purulent as what we have noticed in the past. There is no increased pain and no signs of worsening in general. 10-06-2021 upon evaluation today patient's wound is continuing to show signs of issues here at this point. Fortunately I do not see any evidence of active infection locally or systemically which is great news but at the same time he is definitely having some ongoing problems here currently. 5/8; 2-week follow-up. Right medial ankle. Small wound purulent drainage. He is on doxycycline chronically for underlying osteomyelitis. He has hardware in this area. He is using Hydrofera Blue  packing strips 11-02-2021 upon evaluation today patient appears to be doing much better in regard to his wound I am very pleased to see this. I do not see any evidence of active infection locally nor systemically which is great news. No fevers, chills, nausea, vomiting, or diarrhea. 11-16-2021 upon evaluation today patient appears to be doing Jeremy [name] with regard to his ankle ulcer this is a very tiny area remaining at this point. Fortunately I do not see any evidence of infection locally or systemically at this time. Overall I think we are on the right track. 11-30-2021 Upon inspection patient's wound bed actually showed signs of having just a very small opening that still between 1 to 2 mm max. The back end of the sterile Q-tip stick I was able to get down in here and it does still probe down to bone. With that being said he still has drainage as Jeremy [name]. Nonetheless he has not wanted any further surgical intervention and so mainly is just more of something were doing to try to manage this as best we can do only thing really were doing a split Hydrofera Blue externally has a lot of new skin covering over where previously had a lot of open area but at the same time I am still concerned about the fact this probes all the way down to bone. 12-14-2021 upon evaluation today patient appears to be doing about the same in regard to his wound. This still actually probes down to bone. He has not seen ID anytime recently. Has been off of the doxycycline for quite some time. For that reason I really feel like we may need to see about getting something done a little bit more specific for him here and when to try to obtain a culture today so that we can see what that shows and maybe get him on antibiotics that could help this to heal faster. He is in agreement with that plan. 12-28-2021 upon evaluation today patient appears to be doing worse in regard to his wound. Unfortunately this has sealed up and does appear to be  trapping quite a bit of fluid underneath the skin the skin is loose and honestly needs to be trimmed away I discussed that with the patient today. He voiced understanding and did consent to the debridement. This is good to be done with scissors and forceps. 01-11-2022 upon evaluation today patient's wound actually is showing signs of being a little better than last time I saw him. I do believe that the doxycycline has been effective in calming this down to some degree. Fortunately I do not see any evidence  of active infection at this time systemically although locally he is actually having some signs of cellulitis though this is much better compared to 2 weeks ago when I last saw him. 01-25-2022 upon evaluation today patient appears to be doing Jeremy [name] with regard to his foot ulcer all things considered. He still has a wound that goes deeper I think switching back to the [facility] rope is probably can to be better for him at this point. 02-08-2022 upon evaluation today patient appears to be doing Jeremy [name] currently in regard to his wound all things considered. This is still chronic osteomyelitis issue at least from the previous evaluations. He has previously been on antibiotics for an extended period of time. Right now I have a Mannam which I started back on July 17 and that was for 2 refills he is still taking that. There was an initial 30-day supply. That should actually carry him from July 17 to October 17. With that being said I am really not sure that we are seeing a whole lot of improvement we seem to have some waxing and waning but would not ever really completely seeing this healed unfortunately. I do think the Hydrofera Blue rope is the best way to go however this still is going all the way down to bone and that has me concerned. I discussed this with the patient before but he really does not want to take any further surgical intervention he mainly is just wanting to try let this heal its own way  and he is happy with the way things are going at the moment. 02-23-2022 upon evaluation today patient appears to be doing Jeremy [name] currently in regard to his wound all things considered. He still taking the doxycycline which I do have him on long-term in order to keep this under control. He was post to be on it by infectious disease but when that ran out I continued this as he has continued and ongoing chronic osteomyelitis of the ankle. He has been recommended amputation but he does not want to proceed with that. He also is not a HBO candidate at this point. He also tells me that he really feels like it is "doing better even though the size everything is really about the same there is no significant improvement in general here. 03-09-2022 upon evaluation today patient appears to be doing decently Jeremy [name] in regard to his wound. This is still probing down to bone however. Again we discussed treatment options which have always included amputation and IV antibiotics but which the patient has declined in the past. Subsequently we have had him on oral antibiotics for some time and this does seem to help to a degree. With that being said I do not see any evidence of infection locally or systemically right now although he does still have some drainage this is not doing nearly as poorly as what it had been in the past. With that being said I do think that doing an updated x-ray would not be a bad idea based on where we stand currently as Jeremy [name]. That was discussed with the patient today. 03-23-2022 upon evaluation today patient's wound actually is showing signs of doing about the same in regard to the ankle region. Fortunately there does not Jeremy Jeremy [name], Jeremy Jeremy [name] ([id number]) [id number] Page 3 of 7 appear to be any signs of active infection locally nor systemically at this time which is great news. With that being said I did review his x-rays and in fact reviewed the actual  images as Jeremy [name]. What I  found was that I am concerned about the hardware loosening and to be honest I think that this is something that he probably needs to have either revision or removal of the hardware in general in order to try to allow this wound to heal with the hardware there and I feel like going to continue to have issues to be perfectly honest. I discussed that with him today and I would like for him to see Dr. Sanjuana Letters who is a local podiatrist whom I trust to see what he suggest about any possibility of infection from the hardware and whether or not the hardware loosening could be potentially a cause here and something that could be fixed with intervention. 04-06-2022 upon evaluation today patient unfortunately has continued to have this waxing and waning as far as the wound size today it had somewhat scabbed over and there was a lot of fluid underneath and when this open the wound is again larger today compared to what it was last time I saw him. Again I do believe this likely represents an infected hardware/potential osteomyelitis type issue and I discussed that with him today. He wanted to see his x-rays which I did show him today in order to help him understand what we were dealing with and how things were internally. With that being said I explained that I am not a surgeon nor a radiologist. Nonetheless I have looked at a fair number of x-rays and I do believe that the potential for the fractured screw that is remaining and there may be a site of infection or to be honest any of the other hardware. Therefore I did want him to see a surgeon to discuss options and that is why I did send him to Dr. Sanjuana Letters who he is supposed to be seeing on Friday. 11/7; small wound but easily probes to bone. He has known chronic osteomyelitis and/or hardware infection in this area. He is on antibiotics. I am not certain about orthopedic follow-up.Scant drainage. Electronic Signature(s) Signed: 04/20/2022 5:01:28 PM By:  Baltazar Najjar MD Entered By: Baltazar Najjar on 04/20/2022 10:56:28 -------------------------------------------------------------------------------- Physical Exam Details Patient Name: Date of Service: Jeremy Jeremy Jeremy [name], Jeremy Jeremy [name] 04/20/2022 9:15 A M Medical Record Number: [id number] Patient Account Number: [id number] Date of Birth/Sex: Treating RN: October 02, 1955 (66 y.o. Jeremy Cluck Primary Care Provider: Barbette Reichmann Other Clinician: Referring Provider: Treating Provider/Extender: Jeremy Jeremy [name], Jeremy Jeremy [name] Hande, Vishwanath Weeks in Treatment: 32 Constitutional Patient is hypertensive.. Pulse regular and within target range for patient.Marland Kitchen Respirations regular, non-labored and within target range.. Temperature is normal and within the target range for the patient.Marland Kitchen appears in no distress. Notes Wound exam; small probing wound. I used a skinny to examine this clearly goes to hardware or bone. However in the process of this the patient became irate that I had not really explain what I was doing. The nurse had previously done this as Jeremy [name]. I apologized but he would not let me do anything further. Electronic Signature(s) Signed: 04/20/2022 5:01:28 PM By: Baltazar Najjar MD Entered By: Baltazar Najjar on 04/20/2022 10:57:21 -------------------------------------------------------------------------------- Problem List Details Patient Name: Date of Service: Jeremy Spurling, Jeremy Jeremy [name] 04/20/2022 9:15 A M Medical Record Number: [id number] Patient Account Number: [id number] Date of Birth/Sex: Treating RN: 1956-03-04 (67 y.o. 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 without 09/07/2021 No Yes evidence of necrosis E11.622 Type 2 diabetes mellitus with other skin ulcer 09/07/2021 No Yes I10 Essential (primary) hypertension 09/07/2021 No Yes I25.10 Atherosclerotic heart disease of native coronary artery without angina pectoris 09/07/2021 No Yes Inactive Problems Resolved Problems Electronic Signature(s) Signed: 04/20/2022 5:01:28 PM By: Baltazar Najjar MD Entered By: Baltazar Najjar on 04/20/2022 10:54:45 -------------------------------------------------------------------------------- Progress Note Details Patient Name: Date of Service: Jeremy Jeremy Jeremy [name], Jeremy Jeremy [name] 04/20/2022 9:15 A M Medical Record Number: [id number] Patient Account Number: [id number] Date of Birth/Sex: Treating RN: 09/28/1955 (66 y.o. Jeremy Cluck Primary Care Provider: Barbette Reichmann Other Clinician: Referring Provider: Treating Provider/Extender: Jeremy Jeremy [name], Jeremy Jeremy [name] Hande, Vishwanath Weeks in Treatment: 32 Subjective Chief Complaint Information obtained from Patient Right ankle ulcer History of Present Illness (HPI) 08/18/2020 upon evaluation today patient presents for initial inspection here in our clinic concerning issues has been having with his right lateral ankle and this has been present for at least a year he tells me. He is a patient of Dr. Orland Jarred. Subsequently Dr. Orland Jarred has since retired hence the patient look this up and is coming here for wound care to see if we can help him out. He did have a screw pushed out of this location and since that time tells me that the screw was removed but nonetheless the hole has remained. Fortunately there does not appear to be any signs of active infection systemically at this point though it does raise the question as to whether or not this might be a bone/hardware infection being that this has been open for so long. The patient  does have a history of diabetes mellitus type 2, coronary artery disease, hypertension, and unfortunately does not seem to be doing as great as I would like to see him regard to his left ankle. Jeremy Jeremy [name], Jeremy Jeremy [name] ([id number]) [id number] Page 5 of 7 08/25/2020 upon evaluation today patient appears to be doing Jeremy [name] with regard to his ankle ulcer compared to last week this is definitely smaller. With that being said it still does probe down to bone/hardware. I still think this can be appropriate for him to see the orthopedic specialist. He has not heard from them yet he tells me. 09/05/2020 upon evaluation today patient appears to be doing extremely Jeremy [name] in regard to his wound all things considered. I do not see that this is gotten any worse. Unfortunately also do not think he is gotten any better. We did make a referral to orthopedics to Dr. Victorino Dike specifically but the patient tells me that he has not heard from them. That is unusual as they normally get in touch with patients fairly quickly. I will ask him if he possibly missed a phone call he tells me he will check when he gets home. Nonetheless he does not want to go to [facility] anyway which he did not tell me previously therefore we will get a see about making a referral to Dr. Logan Bores to see what he potentially could recommend for the patient as Jeremy [name] I think Dr. Logan Bores is excellent and a good option here. Readmission: 10/28/2020 upon evaluation today patient appears to be doing about the same as when I last saw him. He did not come back for follow-up last time I saw him was  09/05/2020. Basically he tells me that he has been attempting to manage this on his own using the Hydrofera Blue rope. He did get a call from [facility] but they did not actually get him scheduled due to the fact that the patient told me that he told them he did not wanted to see them he just wanted to come back and see me because he was happy with  my care. Nonetheless as I explained to the patient he has a fractured screw at the site of the wound he also has another screw where there appears to be some loosening of the screw and potential for hardware/bone infection here. Subsequently I think he really does need to see a specialist to see what they can do to help him out I do not believe him to be able to get this healed without taking care of the hardware issues. 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 He has been advised previous of a need for an above-knee amputation due to some of the aggressive breakdown in the ankle region. Nonetheless he has had the removal of the screw performed in office with Dr. Excell Seltzer and this was on 08/28/2021. Subsequently the patient is a poor historian he also gets upset very easily. Of note I do believe that he is going to require some dressings to be packed into the area due to the fact that to be honest this is still tracking all the way straight down to bone where there was pus and purulence coming out once I did get down to that area. Subsequently he tells me that he is having pain although it does seem to be doing better since the screw was removed. 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 Therefore his best options probably do entail the continue following up by Dr. Sampson Goon for infectious disease coupled with wound care as best we can and try to see if we keep this clean as much as possible and keep it from becoming more infected. At some point there may be a time where this could continually get worse and may end up not doing nearly as Jeremy [name] but right now he does not seem to be doing too poorly and I think this is the best way to support him. I am not overly  confident that this is ever getting heal however. 09/15/21 upon evaluation today patient appears to be doing Jeremy [name] with regard to his wound. He has been tolerating the dressing changes without complication. Fortunately I do not see any signs of active infection at this time. Overall I think that he is doing quite Jeremy [name]. 09-22-2021 upon evaluation today patient appears to be doing about the same in regard to his ankle. 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 He again has been told that there is probably not a likely scenario in which this is able to heal. Nonetheless he is not a HBO candidate due to low ejection fraction. He does see Dr. Sampson Goon he still taking antibiotics at this point we will try to keep the area open so does not develop an abscess. 09-29-2021 upon evaluation today patient appears to be doing Jeremy [name] with  regard to his wound all things considered. He continues to use the [employer]	[place] rope. This is still a fairly deep wound that I think the rope is doing Jeremy [name] to help keep the drainage from collecting in the base of the wound there does appear to be less drainage definitely not as purulent as what we have noticed in the past. There is no increased pain and no signs of worsening in general. 10-06-2021 upon evaluation today patient's wound is continuing to show signs of issues here at this point. Fortunately I do not see any evidence of active infection locally or systemically which is great news but at the same time he is definitely having some ongoing problems here currently. 5/8; 2-week follow-up. Right medial ankle. Small wound purulent drainage. He is on doxycycline chronically for underlying osteomyelitis. He has hardware in this area. He is using Hydrofera Blue packing strips 11-02-2021 upon evaluation today patient appears to be doing much better in regard to his wound I am very pleased to see this.  I do not see any evidence of active infection locally nor systemically which is great news. No fevers, chills, nausea, vomiting, or diarrhea. 11-16-2021 upon evaluation today patient appears to be doing Jeremy [name] with regard to his ankle ulcer this is a very tiny area remaining at this point. Fortunately I do not see any evidence of infection locally or systemically at this time. Overall I think we are on the right track. 11-30-2021 Upon inspection patient's wound bed actually showed signs of having just a very small opening that still between 1 to 2 mm max. The back end of the sterile Q-tip stick I was able to get down in here and it does still probe down to bone. With that being said he still has drainage as Jeremy [name]. Nonetheless he has not wanted any further surgical intervention and so mainly is just more of something were doing to try to manage this as best we can do only thing really were doing a split Hydrofera Blue externally has a lot of new skin covering over where previously had a lot of open area but at the same time I am still concerned about the fact this probes all the way down to bone. 12-14-2021 upon evaluation today patient appears to be doing about the same in regard to his wound. This still actually probes down to bone. He has not seen ID anytime recently. Has been off of the doxycycline for quite some time. For that reason I really feel like we may need to see about getting something done a little bit more specific for him here and when to try to obtain a culture today so that we can see what that shows and maybe get him on antibiotics that could help this to heal faster. He is in agreement with that plan. 12-28-2021 upon evaluation today patient appears to be doing worse in regard to his wound. Unfortunately this has sealed up and does appear to be trapping quite a bit of fluid underneath the skin the skin is loose and honestly needs to be trimmed away I discussed that with the patient today. He  voiced understanding and did consent to the debridement. This is good to be done with scissors and forceps. 01-11-2022 upon evaluation today patient's wound actually is showing signs of being a little better than last time I saw him. I do believe that the doxycycline has been effective in calming this down to some degree. Fortunately I do not see any  evidence of active infection at this time systemically although locally he is actually having some signs of cellulitis though this is much better compared to 2 weeks ago when I last saw him. 01-25-2022 upon evaluation today patient appears to be doing Jeremy [name] with regard to his foot ulcer all things considered. He still has a wound that goes deeper I think switching back to the [facility] rope is probably can to be better for him at this point. 02-08-2022 upon evaluation today patient appears to be doing Jeremy [name] currently in regard to his wound all things considered. This is still chronic osteomyelitis issue at least from the previous evaluations. He has previously been on antibiotics for an extended period of time. Right now I have a Mannam which I started back on July 17 and that was for 2 refills he is still taking that. There was an initial 30-day supply. That should actually carry him from July 17 to October 17. With that being said I am really not sure that we are seeing a whole lot of improvement we seem to have some waxing and waning but would not ever really completely seeing this healed unfortunately. I do think the Hydrofera Blue rope is the best way to go however this still is going all the way down to bone and that has me concerned. I discussed this with the patient before but he really does not want to take any further surgical intervention he mainly is just wanting to try let this heal its own way and he is happy with the way things are going at the moment. 02-23-2022 upon evaluation today patient appears to be doing Jeremy [name] currently in regard to  his wound all things considered. He still taking the doxycycline which I do have him on long-term in order to keep this under control. He was post to be on it by infectious disease but when that ran out I continued this as he has continued and ongoing chronic osteomyelitis of the ankle. He has been recommended amputation but he does not want to proceed with that. He also is not a HBO candidate at this point. He also tells me that he really feels like it is "doing better even though the size everything is really about the same there is no significant improvement in general here. Jeremy Jeremy [name], Jeremy Jeremy [name] ([id number]) [id number] Page 6 of 7 03-09-2022 upon evaluation today patient appears to be doing decently Jeremy [name] in regard to his wound. This is still probing down to bone however. Again we discussed treatment options which have always included amputation and IV antibiotics but which the patient has declined in the past. Subsequently we have had him on oral antibiotics for some time and this does seem to help to a degree. With that being said I do not see any evidence of infection locally or systemically right now although he does still have some drainage this is not doing nearly as poorly as what it had been in the past. With that being said I do think that doing an updated x-ray would not be a bad idea based on where we stand currently as Jeremy [name]. That was discussed with the patient today. 03-23-2022 upon evaluation today patient's wound actually is showing signs of doing about the same in regard to the ankle region. Fortunately there does not appear to be any signs of active infection locally nor systemically at this time which is great news. With that being said I did review his x-rays and in fact reviewed the  actual images as Jeremy [name]. What I found was that I am concerned about the hardware loosening and to be honest I think that this is something that he probably needs to have either  revision or removal of the hardware in general in order to try to allow this wound to heal with the hardware there and I feel like going to continue to have issues to be perfectly honest. I discussed that with him today and I would like for him to see Dr. Sanjuana Letters who is a local podiatrist whom I trust to see what he suggest about any possibility of infection from the hardware and whether or not the hardware loosening could be potentially a cause here and something that could be fixed with intervention. 04-06-2022 upon evaluation today patient unfortunately has continued to have this waxing and waning as far as the wound size today it had somewhat scabbed over and there was a lot of fluid underneath and when this open the wound is again larger today compared to what it was last time I saw him. Again I do believe this likely represents an infected hardware/potential osteomyelitis type issue and I discussed that with him today. He wanted to see his x-rays which I did show him today in order to help him understand what we were dealing with and how things were internally. With that being said I explained that I am not a surgeon nor a radiologist. Nonetheless I have looked at a fair number of x-rays and I do believe that the potential for the fractured screw that is remaining and there may be a site of infection or to be honest any of the other hardware. Therefore I did want him to see a surgeon to discuss options and that is why I did send him to Dr. Sanjuana Letters who he is supposed to be seeing on Friday. 11/7; small wound but easily probes to bone. He has known chronic osteomyelitis and/or hardware infection in this area. He is on antibiotics. I am not certain about orthopedic follow-up.Scant drainage. Objective Constitutional Patient is hypertensive.. Pulse regular and within target range for patient.Marland Kitchen Respirations regular, non-labored and within target range.. Temperature is normal and within the  target range for the patient.Marland Kitchen appears in no distress. Vitals Time Taken: 9:26 AM, Height: 71 in, Weight: 250 lbs, BMI: 34.9, Temperature: 98.4 F, Pulse: 83 bpm, Respiratory Rate: 16 breaths/min, Blood Pressure: 153/115 mmHg. General Notes: Wound exam; small probing wound. I used a skinny to examine this clearly goes to hardware or bone. However in the process of this the patient became irate that I had not really explain what I was doing. The nurse had previously done this as Jeremy [name]. I apologized but he would not let me do anything further. Integumentary (Hair, Skin) Wound #3 status is Open. Original cause of wound was Gradually Appeared. The date acquired was: 08/24/2021. The wound has been in treatment 32 weeks. The wound is located on the Right,Lateral Ankle. The wound measures 0.5cm length x 0.5cm width x 0.9cm depth; 0.196cm^2 area and 0.177cm^3 volume. There is bone and Fat Layer (Subcutaneous Tissue) exposed. There is no tunneling or undermining noted. There is a medium amount of purulent drainage noted. There is medium (34-66%) pink granulation within the wound bed. There is a small (1-33%) amount of necrotic tissue within the wound bed including Adherent Slough. 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 We continued with the Hydrofera Blue rope that the nurses were forming small strip set up. Apparently the patient does this is pulm as Jeremy [name] 2. He was supposed to have orthopedic follow-up at this point I am not sure if he actually went or not and what the results of that were. 3. I am not sure whether the patient went to his orthopedic appointment or not. I do not have any  information on this currently. I apologized to the patient for being abrupt with probing the wound with a skinny however I could not calm him down enough to really complete an evaluation Electronic Signature(s) Signed: 04/20/2022 5:01:28 PM By: Baltazar Najjar MD Jeremy Jeremy [name], Jeremy Jeremy [name] ([id number]) [id number] Page 7 of 7 Entered By: Baltazar Najjar on 04/20/2022 10:59:23 -------------------------------------------------------------------------------- SuperBill Details Patient Name: Date of Service: Jeremy Jeremy [name], Jeremy Jeremy [name] RSHA Jeremy [name] 04/20/2022 Medical Record Number: [id number] Patient Account Number: [id number] Date of Birth/Sex: Treating RN: Jun 02, 1956 (66 y.o. 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 E11.622 Type 2 diabetes mellitus with other skin ulcer I10 Essential (primary) hypertension I25.10 Atherosclerotic heart disease of native coronary artery without angina pectoris Facility Procedures : CPT4 Code: [id number] Description: 89373 - WOUND CARE VISIT-LEV 2 EST PT Modifier: Quantity: 1 Physician Procedures : CPT4 Code Description Modifier [id number] - WC PHYS LEVEL 2 - EST PT ICD-10 Diagnosis Description M86.471 Chronic osteomyelitis with draining sinus, right ankle and foot L97.316 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

## 2022-04-27 ENCOUNTER — Ambulatory Visit: Payer: Medicaid Other | Admitting: Physician Assistant

## 2022-04-29 ENCOUNTER — Encounter: Payer: Medicaid Other | Admitting: Physician Assistant

## 2022-04-29 DIAGNOSIS — E1169 Type 2 diabetes mellitus with other specified complication: Secondary | ICD-10-CM | POA: Diagnosis not present

## 2022-04-29 NOTE — Progress Notes (Addendum)
PASCAL, STIGGERS ([id number]) [id number] Page 1 of 9 Visit Report for 04/29/2022 Arrival Information Details Patient Name: Date of Service: Jeremy [name], [place] [facility] 04/29/2022 11:30 A M Medical Record Number: [id number] Patient Account Number: [id number] Date of Birth/Sex: Treating RN: December 06, 1955 (66 y.o. Jeremy [name] Primary Care Jennings Corado: Barbette Reichmann Other Clinician: Referring Alene Bergerson: Treating Itsel Opfer/Extender: Jearld Shines Weeks in Treatment: 26 Visit Information History Since Last Visit Added or deleted any medications: No Patient Arrived: Ambulatory Any new allergies or adverse reactions: No Arrival Time: 12:02 Had a fall or experienced change in No Accompanied By: self activities of daily living that may affect Transfer Assistance: None risk of falls: Patient Requires Transmission-Based Precautions: No Hospitalized since last visit: No Patient Has Alerts: Yes Pain Present Now: No Patient Alerts: Patient on Blood Thinner Eliquis Electronic Signature(s) Signed: 04/29/2022 4:56:08 PM By: Midge Aver MSN RN CNS WTA Entered By: Midge Aver on 04/29/2022 12:05:26 -------------------------------------------------------------------------------- Clinic Level of Care Assessment Details Patient Name: Date of Service: [place], [place] [facility] 04/29/2022 11:30 A M Medical Record Number: [id number] Patient Account Number: [id number] Date of Birth/Sex: Treating RN: 1955-12-11 (66 y.o. Jeremy [name] Primary Care Grady Lucci: Barbette Reichmann Other Clinician: Referring Baelyn Doring: Treating Ramey Schiff/Extender: Jearld Shines Weeks in Treatment: 18 Clinic Level of Care Assessment Items TOOL 4 Quantity Score X- 1 0 Use when only an EandM is performed on FOLLOW-UP visit ASSESSMENTS - Nursing Assessment / Reassessment X- 1 10 Reassessment of Co-morbidities (includes updates in patient status) X- 1 5 Reassessment of Adherence  to Treatment Plan ASSESSMENTS - Wound and Skin A ssessment / Reassessment X - Simple Wound Assessment / Reassessment - one wound 1 5 []  - 0 Complex Wound Assessment / Reassessment - multiple wounds Jeremy [name], Jeremy [name] ( [id number] Page 2 of 9 []  - 0 Dermatologic / Skin Assessment (not related to wound area) ASSESSMENTS - Focused Assessment []  - 0 Circumferential Edema Measurements - multi extremities []  - 0 Nutritional Assessment / Counseling / Intervention []  - 0 Lower Extremity Assessment (monofilament, tuning fork, pulses) []  - 0 Peripheral Arterial Disease Assessment (using hand held doppler) ASSESSMENTS - Ostomy and/or Continence Assessment and Care []  - 0 Incontinence Assessment and Management []  - 0 Ostomy Care Assessment and Management (repouching, etc.) PROCESS - Coordination of Care X - Simple Patient / Family Education for ongoing care 1 15 []  - 0 Complex (extensive) Patient / Family Education for ongoing care X- 1 10 Staff obtains ) [id number], Records, T Results / Process Orders est []  - 0 Staff telephones HHA, Nursing Homes / Clarify orders / etc []  - 0 Routine Transfer to another Facility (non-emergent condition) []  - 0 Routine Hospital Admission (non-emergent condition) []  - 0 New Admissions / / Ordering NPWT Apligraf, etc. , []  - 0 Emergency Hospital Admission (emergent condition) X- 1 10 Simple Discharge Coordination []  - 0 Complex (extensive) Discharge Coordination PROCESS - Special Needs []  - 0 Pediatric / Minor Patient Management []  - 0 Isolation Patient Management []  - 0 Hearing / Language / Visual special needs []  - 0 Assessment of Community assistance (transportation, D/C planning, etc.) []  - 0 Additional assistance / Altered mentation []  - 0 Support Surface(s) Assessment (bed, cushion, seat, etc.) INTERVENTIONS - Wound Cleansing / Measurement X - Simple Wound Cleansing - one wound  1 5 []  - 0 Complex Wound Cleansing - multiple wounds X- 1 5 Wound Imaging (photographs - any number of wounds) []  - 0 Wound Tracing (instead of photographs)  X- 1 5 Simple Wound Measurement - one wound []  - 0 Complex Wound Measurement - multiple wounds INTERVENTIONS - Wound Dressings X - Small Wound Dressing one or multiple wounds 1 10 []  - 0 Medium Wound Dressing one or multiple wounds []  - 0 Large Wound Dressing one or multiple wounds []  - 0 Application of Medications - topical []  - 0 Application of Medications - injection INTERVENTIONS - Miscellaneous []  - 0 External ear exam []  - 0 Specimen Collection (cultures, biopsies, blood, body fluids, etc.) []  - 0 Specimen(s) / Culture(s) sent or taken to Lab for analysis []  - 0 Patient Transfer (multiple staff / Teddy SpikeHoyer Lift / Similar devices) Jeremy [name], Jeremy [name] ([id number]) [id number] Page 3 of 9 []  - 0 Simple Staple / Suture removal (25 or less) []  - 0 Complex Staple / Suture removal (26 or more) []  - 0 Hypo / Hyperglycemic Management (close monitor of Blood Glucose) []  - 0 Ankle / Brachial Index (ABI) - do not check if billed separately X- 1 5 Vital Signs Has the patient been seen at the hospital within the last three years: Yes Total Score: 85 Level Of Care: New/Established - Level 3 Electronic Signature(s) Signed: 04/29/2022 4:56:08 PM By: Midge AverSmith, Vicki MSN RN CNS WTA Entered By: Midge AverSmith, Vicki on 04/29/2022 12:36:22 -------------------------------------------------------------------------------- Encounter Discharge Information Details Patient Name: Date of Service: Jeremy SpurlingO [place], [facility] 04/29/2022 11:30 A M Medical Record Number: [id number] Patient Account Number: [id number] Date of Birth/Sex: Treating RN: 05/27/1956 (66 y.o. Jeremy CluckM) Smith, Vicki Primary Care Brittiny Levitz: Barbette ReichmannHande, Vishwanath Other Clinician: Referring Estell Puccini: Treating Domenica Weightman/Extender: Jearld ShinesStone, Hoyt Hande, Vishwanath Weeks in  Treatment: 133 Encounter Discharge Information Items Discharge Condition: Stable Ambulatory Status: Ambulatory Discharge Destination: Home Transportation: Private Auto Accompanied By: self Schedule Follow-up Appointment: Yes Clinical Summary of Care: Electronic Signature(s) Signed: 04/29/2022 4:56:08 PM By: Midge AverSmith, Vicki MSN RN CNS WTA Entered By: Midge AverSmith, Vicki on 04/29/2022 12:37:52 -------------------------------------------------------------------------------- Lower Extremity Assessment Details Patient Name: Date of Service: [place], [facility] 04/29/2022 11:30 A M Medical Record Number: [id number] Patient Account Number: [id number] Date of Birth/Sex: Treating RN: 05/27/1956 (66 y.o. Jeremy CluckM) Smith, Vicki Primary Care Byan Poplaski: Barbette ReichmannHande, Vishwanath Other Clinician: Referring Chang Tiggs: Treating Steen Bisig/Extender: Jearld ShinesStone, Hoyt Hande, Vishwanath Weeks in Treatment: [address] [place], [place] ([id number]) [id number] Page 4 of 9 Electronic Signature(s) Signed: 04/29/2022 4:56:08 PM By: Midge AverSmith, Vicki MSN RN CNS WTA Entered By: Midge AverSmith, Vicki on 04/29/2022 12:13:13 -------------------------------------------------------------------------------- Multi Wound Chart Details Patient Name: Date of Service: Jeremy SpurlingO WELL, [facility] 04/29/2022 11:30 A M Medical Record Number: [id number] Patient Account Number: [id number] Date of Birth/Sex: Treating RN: 05/27/1956 (66 y.o. Jeremy CluckM) Smith, Vicki Primary Care Teesha Ohm: Barbette ReichmannHande, Vishwanath Other Clinician: Referring Ryota Treece: Treating Dorrine Montone/Extender: Jearld ShinesStone, Hoyt Hande, Vishwanath Weeks in Treatment: 6233 Vital Signs Height(in): 71 Pulse(bpm): 58 Weight(lbs): 250 Blood Pressure(mmHg): 164/97 Body Mass Index(BMI): 34.9 Temperature(F): 98.1 Respiratory Rate(breaths/min): 16 [3:Photos:] [N/A:N/A] Right, Lateral Ankle N/A N/A Wound Location: Gradually Appeared N/A N/A Wounding Event: Refractory Osteomyelitis N/A N/A Primary  Etiology: Diabetic Wound/Ulcer of the Lower N/A N/A Secondary Etiology: Extremity Arrhythmia, Congestive Heart Failure, N/A N/A Comorbid History: Coronary Artery Disease, Hypertension, Type II Diabetes, Osteoarthritis, Osteomyelitis 08/24/2021 N/A N/A Date Acquired: 5033 N/A N/A Weeks of Treatment: Open N/A N/A Wound Status: No N/A N/A Wound Recurrence: Yes N/A N/A Pending A mputation on Presentation: 0.4x0.3x0.5 N/A N/A Measurements L x W x D (cm) 0.094 N/A N/A A (cm) : rea 0.047 N/A N/A Volume (cm) : -203.20% N/A N/A % Reduction in A rea: -113.60% N/A N/A %  Reduction in Volume: Full Thickness With Exposed Support N/A N/A Classification: Structures Medium N/A N/A Exudate Amount: Purulent N/A N/A Exudate Type: yellow, brown, green N/A N/A Exudate Color: Medium (34-66%) N/A N/A Granulation Amount: Pink N/A N/A Granulation Quality: Small (1-33%) N/A N/A Necrotic Amount: Fat Layer (Subcutaneous Tissue): Yes N/A N/A Exposed Structures: Bone: Yes Fascia: No Tendon: No Jeremy [name], Marguis ([id number]) [id number] Page 5 of 9 Muscle: No Joint: No Small (1-33%) N/A N/A Epithelialization: Treatment Notes Electronic Signature(s) Signed: 04/29/2022 4:56:08 PM By: Midge Aver MSN RN CNS WTA Entered By: Midge Aver on 04/29/2022 12:34:37 -------------------------------------------------------------------------------- Multi-Disciplinary Care Plan Details Patient Name: Date of Service: Jeremy Spurling, [place] [facility] 04/29/2022 11:30 A M Medical Record Number: [id number] Patient Account Number: [id number] Date of Birth/Sex: Treating RN: May 30, 1956 (66 y.o. Jeremy [name] Primary Care Sahib Pella: Barbette Reichmann Other Clinician: Referring Carmelia Tiner: Treating Nazier Neyhart/Extender: Jearld Shines Weeks in Treatment: 59 Active Inactive Osteomyelitis Nursing Diagnoses: Knowledge deficit related to disease process and management Potential  for infection: osteomyelitis Goals: Patient/caregiver will verbalize understanding of disease process and disease management Date Initiated: 09/07/2021 Target Resolution Date: 09/18/2021 Goal Status: Active Interventions: Assess for signs and symptoms of osteomyelitis resolution every visit Provide education on osteomyelitis Notes: Wound/Skin Impairment Nursing Diagnoses: Impaired tissue integrity Knowledge deficit related to smoking impact on wound healing Knowledge deficit related to ulceration/compromised skin integrity Goals: Patient will demonstrate a reduced rate of smoking or cessation of smoking Date Initiated: 09/07/2021 Target Resolution Date: 10/16/2021 Goal Status: Active Patient/caregiver will verbalize understanding of skin care regimen Date Initiated: 09/07/2021 Target Resolution Date: 09/18/2021 Goal Status: Active Ulcer/skin breakdown will have a volume reduction of 30% by week 4 Date Initiated: 09/07/2021 Target Resolution Date: 10/05/2021 Goal Status: Active Ulcer/skin breakdown will have a volume reduction of 50% by week 8 Date Initiated: 09/07/2021 Target Resolution Date: 11/02/2021 Goal Status: Active Ulcer/skin breakdown will have a volume reduction of 80% by week 12 Stenseth, Agustin ([id number]) [id number] Page 6 of 9 Date Initiated: 09/07/2021 Target Resolution Date: 11/30/2021 Goal Status: Active Ulcer/skin breakdown will heal within 14 weeks Date Initiated: 09/07/2021 Target Resolution Date: 12/14/2021 Goal Status: Active Interventions: Assess patient/caregiver ability to obtain necessary supplies Assess patient/caregiver ability to perform ulcer/skin care regimen upon admission and as needed Assess ulceration(s) every visit Notes: Electronic Signature(s) Signed: 04/29/2022 4:56:08 PM By: Midge Aver MSN RN CNS WTA Entered By: Midge Aver on 04/29/2022  12:13:18 -------------------------------------------------------------------------------- Pain Assessment Details Patient Name: Date of Service: Jeremy Spurling, [place] [facility] 04/29/2022 11:30 A M Medical Record Number: [id number] Patient Account Number: [id number] Date of Birth/Sex: Treating RN: [birth year] (66 y.o. Jeremy [name] Primary Care Addyson Traub: Barbette Reichmann Other Clinician: Referring Brent Taillon: Treating Ziaire Bieser/Extender: Jearld Shines Weeks in Treatment: 95 Active Problems Location of Pain Severity and Description of Pain Patient Has Paino No Site Locations Pain Management and Medication Current Pain Management: Electronic Signature(s) Signed: 04/29/2022 4:56:08 PM By: Midge Aver MSN RN CNS WTA Entered By: Midge Aver on 04/29/2022 12:06:56 [name] ([id number]) [id number] Page 7 of 9 -------------------------------------------------------------------------------- Patient/Caregiver Education Details Patient Name: Date of Service: Jeremy [name], [place] [facility] [place] 11/16/2023andnbsp11:30 A M Medical Record Number: [id number] Patient Account Number: [id number] Date of Birth/Gender: Treating RN: February 03, 1956 (66 y.o. Jeremy [name] Primary Care Physician: Barbette Reichmann Other Clinician: Referring Physician: Treating Physician/Extender: Jearld Shines Weeks in Treatment: 33 Education Assessment Education Provided To: Patient Education Topics Provided Wound/Skin Impairment: Handouts: Caring for Your Ulcer Methods: Explain/Verbal Responses: State content correctly Electronic Signature(s)  Signed: 04/29/2022 4:56:08 PM By: Midge Aver MSN RN CNS WTA Entered By: Midge Aver on 04/29/2022 12:36:46 -------------------------------------------------------------------------------- Wound Assessment Details Patient Name: Date of Service: Jeremy Spurling, [place] [facility] 04/29/2022 11:30 A M Medical Record Number: [id number] Patient  Account Number: [id number] Date of Birth/Sex: Treating RN: 13-Jul-1955 (66 y.o. 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 Wounding Event: Gradually Appeared Etiology: Date Acquired: 08/24/2021 Wound Open Weeks Of Treatment: 33 Status: Clustered Wound: No Comorbid Arrhythmia, Congestive Heart Failure, Coronary Artery Disease, Pending Amputation On Presentation History: Hypertension, Type II Diabetes, Osteoarthritis, Osteomyelitis Photos Bueso, Jeremy [name] ([id number]) [id number] Page 8 of 9 Wound Measurements Length: (cm) 0.4 Width: (cm) 0.3 Depth: (cm) 0.5 Area: (cm) 0.094 Volume: (cm) 0.047 % Reduction in Area: -203.2% % Reduction in Volume: -113.6% Epithelialization: Small (1-33%) Wound Description Classification: Full Thickness With Exposed Suppor Exudate Amount: Medium Exudate Type: Purulent Exudate Color: yellow, brown, green t Structures Foul Odor After Cleansing: No Slough/Fibrino Yes Wound Bed Granulation Amount: Medium (34-66%) Exposed Structure Granulation Quality: Pink Fascia Exposed: No Necrotic Amount: Small (1-33%) Fat Layer (Subcutaneous Tissue) Exposed: Yes Necrotic Quality: Adherent Slough Tendon Exposed: No Muscle Exposed: No Joint Exposed: No Bone Exposed: Yes Treatment Notes Wound #3 (Ankle) Wound Laterality: Right, Lateral Cleanser Normal Saline Discharge Instruction: Wash your hands with soap and water. Remove old dressing, discard into plastic bag and place into trash. Cleanse the wound with Normal Saline prior to applying a clean dressing using gauze sponges, not tissues or cotton balls. Do not scrub or use excessive force.  Pat dry using gauze sponges, not tissue or cotton balls. Peri-Wound Care Topical Primary Dressing Hydrofera Blue Classic Foam Rope Dressing, 9x6 (mm/in) Discharge Instruction: cut it a point and place into wound Secondary Dressing T Adhesive Island Dressing, 4x4 (in/in) elfa Discharge Instruction: Apply over dressing to secure in place. Secured With Compression Wrap Compression Stockings Facilities manager) Signed: 04/29/2022 4:56:08 PM By: Midge Aver MSN RN CNS WTA Entered By: Midge Aver on 04/29/2022 12:12:48 [name] ([id number]) [id number] Page 9 of 9 -------------------------------------------------------------------------------- Vitals Details Patient Name: Date of Service: Jeremy [name], [place] 04/29/2022 11:30 A M Medical Record Number: [id number] Patient Account Number: [id number] Date of Birth/Sex: Treating RN: Jan 04, 1956 (66 y.o. Jeremy [name] Primary Care Coretta Leisey: Barbette Reichmann Other Clinician: Referring Hero Kulish: Treating Caiden Monsivais/Extender: Jearld Shines Weeks in Treatment: 67 Vital Signs Time Taken: 12:06 Temperature (F): 98.1 Height (in): 71 Pulse (bpm): 58 Weight (lbs): 250 Respiratory Rate (breaths/min): 16 Body Mass Index (BMI): 34.9 Blood Pressure (mmHg): 164/97 Reference Range: 80 - 120 mg / dl Electronic Signature(s) Signed: 04/29/2022 4:56:08 PM By: Midge Aver MSN RN CNS WTA Entered By: Midge Aver on 04/29/2022 12:06:47

## 2022-04-29 NOTE — Progress Notes (Addendum)
Jeremy, [name] ([id number]) [id number] Page 1 of 9 Visit Report for 04/29/2022 Chief Complaint Document Details Patient Name: Date of Service: Jeremy [name], [place] [facility] 04/29/2022 11:30 A M Medical Record Number: [id number] Patient Account Number: [id number] Date of Birth/Sex: Treating RN: Jeremy [name] (66 y.o. Jeremy [name] Primary Care Provider: Barbette [name] Other Clinician: Referring Provider: Treating Provider/Extender: Jeremy [name] in Treatment: 20 Information Obtained from: Patient Chief Complaint Right ankle ulcer Electronic Signature(s) Signed: 04/29/2022 4:56:08 PM By: Jeremy Aver MSN RN CNS WTA Signed: 04/30/2022 2:32:55 PM By: Jeremy Kelp PA-C Previous Signature: 04/29/2022 11:24:00 AM Version By: Jeremy Kelp PA-C Entered By: Jeremy [name] on 04/29/2022 12:37:02 -------------------------------------------------------------------------------- HPI Details Patient Name: Date of Service: RO Jeremy Ohm, MA [facility] 04/29/2022 11:30 A M Medical Record Number: [id number] Patient Account Number: [id number] Date of Birth/Sex: Treating RN: [birth year]-[name] (66 y.o. Jeremy [name] Primary Care Provider: Barbette [name] Other Clinician: Referring Provider: Treating Provider/Extender: Jeremy [name] in Treatment: 64 History of Present Illness HPI Description: 08/18/2020 upon evaluation today patient presents for initial inspection here in our clinic concerning issues has been having with his right lateral ankle and this has been present for at least a year he tells me. He is a patient of Dr. Orland [name]. Subsequently Dr. Orland [name] has since retired hence the patient look this up and is coming here for wound care to see if we can help him out. He did have a screw pushed out of this location and since that time tells me that the screw was removed but nonetheless the hole has remained. Fortunately there does not appear to be  any signs of active infection systemically at this point though it does raise the question as to whether or not this might be a bone/hardware infection being that this has been open for so long. The patient does have a history of diabetes mellitus type 2, coronary artery disease, hypertension, and unfortunately does not seem to be doing as great as I would like to see him regard to his left ankle. 08/25/2020 upon evaluation today patient appears to be doing well with regard to his ankle ulcer compared to last week this is definitely smaller. With that being said it still does probe down to bone/hardware. I still think this can be appropriate for him to see the orthopedic specialist. He has not heard from them yet he tells me. 09/05/2020 upon evaluation today patient appears to be doing extremely well in regard to his wound all things considered. I do not see that this is gotten any worse. Unfortunately also do not think he is gotten any better. We did make a referral to orthopedics to Dr. Victorino [name] specifically but the patient tells me that he has not heard from them. That is unusual as they normally get in touch with patients fairly quickly. I will ask him if he possibly missed a phone call he tells me Jeremy [name] ([id number]) [id number] Page 2 of 9 he will check when he gets home. Nonetheless he does not want to go to [facility] anyway which he did not tell me previously therefore we will get a see about making a referral to Dr. Logan [name] to see what he potentially could recommend for the patient as well I think Dr. Logan [name] is excellent and a good option here. Readmission: 10/28/2020 upon evaluation today patient appears to be doing about the same as when I last saw him. He did not come back for follow-up last time I  saw him was 09/05/2020. Basically he tells me that he has been attempting to manage this on his own using the Hydrofera Blue rope. He did get a call from [facility] but they did not actually get him scheduled due to the fact that the patient told me that he told them he did not wanted to see them he just wanted to come back and see me because he was happy with my care. Nonetheless as I explained to the patient he has a fractured screw at the site of the wound he also has another screw where there appears to be some loosening of the screw and potential for hardware/bone infection here. Subsequently I think he really does need to see a specialist to see what they can do to help him out I do not believe him to be able to get this healed without taking care of the hardware issues. 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 He has been advised previous of a need for an above-knee amputation due to some of the aggressive breakdown in the ankle region. Nonetheless he has had the removal of the screw performed in office with Dr. Excell [name] and this was on 08/28/2021. Subsequently the patient is a poor historian he also gets upset very easily. Of note I do believe that he is going to require some dressings to be packed into the area due to the fact that to be honest this is still tracking all the way straight down to bone where there was pus and purulence coming out once I did get down to that area. Subsequently he tells me that he is having pain although it does seem to be doing better since the screw was removed. 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 Therefore his best options probably do entail the continue following up by Jeremy [name] for infectious disease coupled with wound care as best we can and try to see if we keep this clean as much as possible and keep it from becoming more infected. At some point there  Jeremy be a time where this could continually get worse and Jeremy end up not doing nearly as well but right now he does not seem to be doing too poorly and I think this is the best way to support him. I am not overly confident that this is ever getting heal however. 09/15/21 upon evaluation today patient appears to be doing well with regard to his wound. He has been tolerating the dressing changes without complication. Fortunately I do not see any signs of active infection at this time. Overall I think that he is doing quite well. 09-22-2021 upon evaluation today patient appears to be doing about the same in regard to his ankle. 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 He again has been told that there is probably not a likely scenario in which this is able to heal. Nonetheless he is not a HBO candidate due to low ejection fraction. He does see Jeremy [name] he still taking antibiotics at this point we will try to keep the area open so does not develop an abscess. 09-29-2021 upon evaluation today patient appears to be  doing well with regard to his wound all things considered. He continues to use the [employer] rope. This is still a fairly deep wound that I think the rope is doing well to help keep the drainage from collecting in the base of the wound there does appear to be less drainage definitely not as purulent as what we have noticed in the past. There is no increased pain and no signs of worsening in general. 10-06-2021 upon evaluation today patient's wound is continuing to show signs of issues here at this point. Fortunately I do not see any evidence of active infection locally or systemically which is great news but at the same time he is definitely having some ongoing problems here currently. 5/8; 2-week follow-up. Right medial ankle. Small wound purulent drainage. He is on doxycycline chronically for underlying  osteomyelitis. He has hardware in this area. He is using Hydrofera Blue packing strips 11-02-2021 upon evaluation today patient appears to be doing much better in regard to his wound I am very pleased to see this. I do not see any evidence of active infection locally nor systemically which is great news. No fevers, chills, nausea, vomiting, or diarrhea. 11-16-2021 upon evaluation today patient appears to be doing well with regard to his ankle ulcer this is a very tiny area remaining at this point. Fortunately I do not see any evidence of infection locally or systemically at this time. Overall I think we are on the right track. 11-30-2021 Upon inspection patient's wound bed actually showed signs of having just a very small opening that still between 1 to 2 mm max. The back end of the sterile Q-tip stick I was able to get down in here and it does still probe down to bone. With that being said he still has drainage as well. Nonetheless he has not wanted any further surgical intervention and so mainly is just more of something were doing to try to manage this as best we can do only thing really were doing a split Hydrofera Blue externally has a lot of new skin covering over where previously had a lot of open area but at the same time I am still concerned about the fact this probes all the way down to bone. 12-14-2021 upon evaluation today patient appears to be doing about the same in regard to his wound. This still actually probes down to bone. He has not seen ID anytime recently. Has been off of the doxycycline for quite some time. For that reason I really feel like we Jeremy need to see about getting something done a little bit more specific for him here and when to try to obtain a culture today so that we can see what that shows and maybe get him on antibiotics that could help this to heal faster. He is in agreement with that plan. 12-28-2021 upon evaluation today patient appears to be doing worse in regard to  his wound. Unfortunately this has sealed up and does appear to be trapping quite a bit of fluid underneath the skin the skin is loose and honestly needs to be trimmed away I discussed that with the patient today. He voiced understanding and did consent to the debridement. This is good to be done with scissors and forceps. 01-11-2022 upon evaluation today patient's wound actually is showing signs of being a little better than last time I saw him. I do believe that the doxycycline has been effective in calming this down to some degree. Fortunately I do  not see any evidence of active infection at this time systemically although locally he is actually having some signs of cellulitis though this is much better compared to 2 [name] ago when I last saw him. 01-25-2022 upon evaluation today patient appears to be doing well with regard to his foot ulcer all things considered. He still has a wound that goes deeper I think switching back to the [facility] rope is probably can to be better for him at this point. 02-08-2022 upon evaluation today patient appears to be doing well currently in regard to his wound all things considered. This is still chronic osteomyelitis issue at least from the previous evaluations. He has previously been on antibiotics for an extended period of time. Right now I have a Mannam which I started back on July 17 and that was for 2 refills he is still taking that. There was an initial 30-day supply. That should actually carry him from July 17 to October 17. With that being said I am really not sure that we are seeing a whole lot of improvement we seem to have some waxing and waning but would not ever really completely seeing this healed unfortunately. I do think the Hydrofera Blue rope is the best way to go however this still is going all the way down to bone and that has me concerned. I discussed this with the patient before but he really does not want to take any further surgical  intervention he mainly is just wanting to try let this heal its own way and he is happy with the way things are going at the moment. 02-23-2022 upon evaluation today patient appears to be doing well currently in regard to his wound all things considered. He still taking the doxycycline which I do have him on long-term in order to keep this under control. He was post to be on it by infectious disease but when that ran out I continued this as he has continued and ongoing chronic osteomyelitis of the ankle. He has been recommended amputation but he does not want to proceed with that. He also is not a HBO candidate at this point. He also tells me that he really feels like it is "doing better even though the size everything is really about the same there is no significant improvement in general here. 03-09-2022 upon evaluation today patient appears to be doing decently well in regard to his wound. This is still probing down to bone however. Again we discussed treatment options which have always included amputation and IV antibiotics but which the patient has declined in the past. Subsequently we have had him on oral antibiotics for some time and this does seem to help to a degree. With that being said I do not see any evidence of infection locally or systemically right now although he does still have some drainage this is not doing nearly as poorly as what it had been in the past. With that being said I do think that doing an updated x-ray would not be a bad idea based on where we stand currently as well. That was discussed with the patient today. JAGGER, DEMONTE ([id number]) [id number] Page 3 of 9 03-23-2022 upon evaluation today patient's wound actually is showing signs of doing about the same in regard to the ankle region. Fortunately there does not appear to be any signs of active infection locally nor systemically at this time which is great news. With that being said I did  review his x-rays and  in fact reviewed the actual images as well. What I found was that I am concerned about the hardware loosening and to be honest I think that this is something that he probably needs to have either revision or removal of the hardware in general in order to try to allow this wound to heal with the hardware there and I feel like going to continue to have issues to be perfectly honest. I discussed that with him today and I would like for him to see Dr. Sanjuana Letters who is a local podiatrist whom I trust to see what he suggest about any possibility of infection from the hardware and whether or not the hardware loosening could be potentially a cause here and something that could be fixed with intervention. 04-06-2022 upon evaluation today patient unfortunately has continued to have this waxing and waning as far as the wound size today it had somewhat scabbed over and there was a lot of fluid underneath and when this open the wound is again larger today compared to what it was last time I saw him. Again I do believe this likely represents an infected hardware/potential osteomyelitis type issue and I discussed that with him today. He wanted to see his x-rays which I did show him today in order to help him understand what we were dealing with and how things were internally. With that being said I explained that I am not a surgeon nor a radiologist. Nonetheless I have looked at a fair number of x-rays and I do believe that the potential for the fractured screw that is remaining and there Jeremy be a site of infection or to be honest any of the other hardware. Therefore I did want him to see a surgeon to discuss options and that is why I did send him to Dr. Sanjuana Letters who he is supposed to be seeing on Friday. 11/7; small wound but easily probes to bone. He has known chronic osteomyelitis and/or hardware infection in this area. He is on antibiotics. I am not certain about orthopedic  follow-up.Scant drainage. 04-29-2022 upon evaluation today patient appears to be doing about the same in regard to his wound. He apparently did finally see Dr. Logan [name] although I have not had a chance to review that note as of yet. Fortunately there does not appear to be any signs of infection at this time which is great news. No fevers, chills, nausea, vomiting, or diarrhea. Electronic Signature(s) Signed: 04/29/2022 3:29:50 PM By: Jeremy Kelp PA-C Entered By: Jeremy [name] on 04/29/2022 15:29:50 -------------------------------------------------------------------------------- Physical Exam Details Patient Name: Date of Service: Jeremy [name], [place] [facility] 04/29/2022 11:30 A M Medical Record Number: [id number] Patient Account Number: [id number] Date of Birth/Sex: Treating RN: [birth year]-[name] (66 y.o. Jeremy [name] Primary Care Provider: Barbette [name] Other Clinician: Referring Provider: Treating Provider/Extender: Jeremy [name] in Treatment: 40 Constitutional Well-nourished and well-hydrated in no acute distress. Respiratory normal breathing without difficulty. Psychiatric this patient is able to make decisions and demonstrates good insight into disease process. Alert and Oriented x 3. pleasant and cooperative. Notes Patient's wound bed actually showed signs of still having probing down to bone/hardware at this point. He tells me that Dr. Logan [name] had said that he could correct the issue and I think that is probably his best bet I am not sure that we can get this healed short of the surgical intervention. Nonetheless the patient seems to be still somewhat reluctant to go forward with surgery. Electronic Signature(s)  Signed: 04/29/2022 3:30:12 PM By: Jeremy Kelp PA-C Entered By: Jeremy [name] on 04/29/2022 15:30:12 [name] ([id number]) [id number] Page 4 of  9 -------------------------------------------------------------------------------- Physician Orders Details Patient Name: Date of Service: Jeremy [name], [place] 04/29/2022 11:30 A M Medical Record Number: [id number] Patient Account Number: [id number] Date of Birth/Sex: Treating RN: [birth year] [name] (66 y.o. 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 Bathing/ Shower/ Hygiene Jeremy shower; gently cleanse wound with antibacterial soap, rinse and pat dry prior to dressing wounds No tub bath. Edema Control - Lymphedema / Segmental Compressive Device / Other Elevate leg(s) parallel to the floor when sitting. DO YOUR BEST to sleep in the bed at night. DO NOT sleep in your recliner. Long hours of sitting in a recliner leads to swelling of the legs and/or potential wounds on your backside. Additional Orders / Instructions Follow Nutritious Diet and Increase Protein Intake - monitor blood glucose to maintain normal level Culture obtained today Medications-Please add to medication list. ntibiotics - continue Doxycycline P.O. A Wound Treatment Wound #3 - Ankle Wound Laterality: Right, Lateral Cleanser: Normal Saline 1 x Per Day/30 Days Discharge Instructions: Wash your hands with soap and water. Remove old dressing, discard into plastic bag and place into trash. Cleanse the wound with Normal Saline  prior to applying a clean dressing using gauze sponges, not tissues or cotton balls. Do not scrub or use excessive force. Pat dry using gauze sponges, not tissue or cotton balls. Prim Dressing: Hydrofera Blue Classic Foam Rope Dressing, 9x6 (mm/in) ary 1 x Per Day/30 Days Discharge Instructions: cut it a point and place into wound Secondary Dressing: T Adhesive [employer], 4x4 (in/in) 1 x Per Day/30 Days elfa Discharge Instructions: Apply over dressing to secure in place. Electronic Signature(s) Signed: 04/29/2022 4:56:08 PM By: Jeremy Aver MSN RN CNS WTA Signed: 04/30/2022 2:32:55 PM By: Jeremy Kelp PA-C Entered By: Jeremy [name] on 04/29/2022 12:35:00 [name] ([id number]) [id number] Page 5 of 9 -------------------------------------------------------------------------------- Problem List Details Patient Name: Date of Service: Jeremy [name], [place] [facility] 04/29/2022 11:30 A M Medical Record Number: [id number] Patient Account Number: [id number] Date of Birth/Sex: Treating RN: 15-Jeremy-[name] (66 y.o. 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 without 09/07/2021 No Yes evidence of necrosis E11.622 Type 2 diabetes mellitus with other skin ulcer 09/07/2021 No Yes I10 Essential (primary) hypertension 09/07/2021 No Yes I25.10 Atherosclerotic heart disease of native coronary artery without angina pectoris 09/07/2021 No Yes Inactive Problems Resolved Problems Electronic Signature(s) Signed: 04/29/2022 4:56:08 PM By: Jeremy Aver MSN RN CNS WTA Signed: 04/30/2022 2:32:55 PM By: Jeremy Kelp  PA-C Previous Signature: 04/29/2022 11:23:49 AM Version By: Jeremy Kelp PA-C Entered By: Jeremy [name] on 04/29/2022 12:36:53 -------------------------------------------------------------------------------- Progress Note Details Patient Name: Date of  Service: Jeremy Spurling, [facility] 04/29/2022 11:30 A RICARDO, SCHUBACH ([id number]) [id number] Page 6 of 9 Medical Record Number: [id number] Patient Account Number: [id number] Date of Birth/Sex: Treating RN: [birth year][name] (66 y.o. Jeremy [name] Primary Care Provider: Barbette [name] Other Clinician: Referring Provider: Treating Provider/Extender: Jeremy [name] in Treatment: 2 Subjective Chief Complaint Information obtained from Patient Right ankle ulcer History of Present Illness (HPI) 08/18/2020 upon evaluation today patient presents for initial inspection here in our clinic concerning issues has been having with his right lateral ankle and this has been present for at least a year he tells me. He is a patient of Dr. Orland [name]. Subsequently Dr. Orland [name] has since retired hence the patient look this up and is coming here for wound care to see if we can help him out. He did have a screw pushed out of this location and since that time tells me that the screw was removed but nonetheless the hole has remained. Fortunately there does not appear to be any signs of active infection systemically at this point though it does raise the question as to whether or not this might be a bone/hardware infection being that this has been open for so long. The patient does have a history of diabetes mellitus type 2, coronary artery disease, hypertension, and unfortunately does not seem to be doing as great as I would like to see him regard to his left ankle. 08/25/2020 upon evaluation today patient appears to be doing well with regard to his ankle ulcer compared to last week this is definitely smaller. With that  being said it still does probe down to bone/hardware. I still think this can be appropriate for him to see the orthopedic specialist. He has not heard from them yet he tells me. 09/05/2020 upon evaluation today patient appears to be doing extremely well in regard to his wound all things considered. I do not see that this is gotten any worse. Unfortunately also do not think he is gotten any better. We did make a referral to orthopedics to Dr. Victorino [name] specifically but the patient tells me that he has not heard from them. That is unusual as they normally get in touch with patients fairly quickly. I will ask him if he possibly missed a phone call he tells me he will check when he gets home. Nonetheless he does not want to go to [facility] ([place]) anyway which he did not tell me previously therefore we will get a see about making a referral to Dr. Logan [name] to see what he potentially could recommend for the patient as well I think Dr. Logan [name] is excellent and a good option here. Readmission: 10/28/2020 upon evaluation today patient appears to be doing about the same as when I last saw him. He did not come back for follow-up last time I saw him was 09/05/2020. Basically he tells me that he has been attempting to manage this on his own using the Hydrofera Blue rope. He did get a call from [facility] but they did not actually get him scheduled due to the fact that the patient told me that he told them he did not wanted to see them he just wanted to come back and see me because he was happy with my care. Nonetheless as I explained to the patient he has a fractured screw at the site of the wound he also has another screw where there appears to be some loosening of the screw and potential for hardware/bone infection  here. Subsequently I think he really does need to see a specialist to see what they can do to help him out I do not believe him to be able to get this healed without taking care of the  hardware issues. 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 He has been advised previous of a need for an above-knee amputation due to some of the aggressive breakdown in the ankle region. Nonetheless he has had the removal of the screw performed in office with Dr. Excell Seltzer and this was on 08/28/2021. Subsequently the patient is a poor historian he also gets upset very easily. Of note I do believe that he is going to require some dressings to be packed into the area due to the fact that to be honest this is still tracking all the way straight down to bone where there was pus and purulence coming out once I did get down to that area. Subsequently he tells me that he is having pain although it does seem to be doing better since the screw was removed. 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 Therefore his best options probably do entail the continue following up by Dr. Sampson Goon for infectious disease coupled with wound care as best we can and try to see if we keep this clean as much as possible and keep it from becoming more infected. At some point there Jeremy be a time where this could continually get worse and Jeremy end up not doing nearly as well but right now he does not seem to be doing too poorly and I think this is the best way to support him. I am not overly confident that this is ever getting heal however. 09/15/21 upon evaluation today patient appears to be doing well with regard to his wound. He has been tolerating the dressing changes without complication. Fortunately I do not see any signs of active infection at this time. Overall I think that he is doing quite well. 09-22-2021 upon evaluation today patient appears to be doing about the same in regard to  his ankle. 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 He again has been told that there is probably not a likely scenario in which this is able to heal. Nonetheless he is not a HBO candidate due to low ejection fraction. He does see Dr. Sampson Goon he still taking antibiotics at this point we will try to keep the area open so does not develop an abscess. 09-29-2021 upon evaluation today patient appears to be doing well with regard to his wound all things considered. He continues to use the [employer] rope. This is still a fairly deep wound that I think the rope is doing well to help keep the drainage from collecting in the base of the wound there does appear to be less drainage definitely not as purulent as what we have noticed in the past. There is no increased pain and no signs of worsening in general. 10-06-2021 upon evaluation today patient's wound is continuing to show signs of issues here at this point. Fortunately I do not see any evidence of active infection locally or systemically which is great news but at the same  time he is definitely having some ongoing problems here currently. 5/8; 2-week follow-up. Right medial ankle. Small wound purulent drainage. He is on doxycycline chronically for underlying osteomyelitis. He has hardware in this area. He is using Hydrofera Blue packing strips 11-02-2021 upon evaluation today patient appears to be doing much better in regard to his wound I am very pleased to see this. I do not see any evidence of active infection locally nor systemically which is great news. No fevers, chills, nausea, vomiting, or diarrhea. 11-16-2021 upon evaluation today patient appears to be doing well with regard to his ankle ulcer this is a very tiny area remaining at this point. Fortunately I do not see any evidence of infection locally or systemically at this time. Overall I think we are on the  right track. 11-30-2021 Upon inspection patient's wound bed actually showed signs of having just a very small opening that still between 1 to 2 mm max. The back end of the sterile Q-tip stick I was able to get down in here and it does still probe down to bone. With that being said he still has drainage as well. Nonetheless he has not wanted any further surgical intervention and so mainly is just more of something were doing to try to manage this as best we can do only thing really were doing a split Hydrofera Blue externally has a lot of new skin covering over where previously had a lot of open area but at the same time I am still concerned about the fact this probes all the way down to bone. 12-14-2021 upon evaluation today patient appears to be doing about the same in regard to his wound. This still actually probes down to bone. He has not seen ID anytime recently. Has been off of the doxycycline for quite some time. For that reason I really feel like we Jeremy need to see about getting something done a little bit more specific for him here and when to try to obtain a culture today so that we can see what that shows and maybe get him on antibiotics that could help this to heal faster. He is in agreement with that plan. [name], Jeremy ([id number]) [id number] Page 7 of 9 12-28-2021 upon evaluation today patient appears to be doing worse in regard to his wound. Unfortunately this has sealed up and does appear to be trapping quite a bit of fluid underneath the skin the skin is loose and honestly needs to be trimmed away I discussed that with the patient today. He voiced understanding and did consent to the debridement. This is good to be done with scissors and forceps. 01-11-2022 upon evaluation today patient's wound actually is showing signs of being a little better than last time I saw him. I do believe that the doxycycline has been effective in calming this down to some degree.  Fortunately I do not see any evidence of active infection at this time systemically although locally he is actually having some signs of cellulitis though this is much better compared to 2 [name] ago when I last saw him. 01-25-2022 upon evaluation today patient appears to be doing well with regard to his foot ulcer all things considered. He still has a wound that goes deeper I think switching back to the [facility] rope is probably can to be better for him at this point. 02-08-2022 upon evaluation today patient appears to be doing well currently in regard to his wound all things considered. This is still chronic osteomyelitis  issue at least from the previous evaluations. He has previously been on antibiotics for an extended period of time. Right now I have a Mannam which I started back on July 17 and that was for 2 refills he is still taking that. There was an initial 30-day supply. That should actually carry him from July 17 to October 17. With that being said I am really not sure that we are seeing a whole lot of improvement we seem to have some waxing and waning but would not ever really completely seeing this healed unfortunately. I do think the Hydrofera Blue rope is the best way to go however this still is going all the way down to bone and that has me concerned. I discussed this with the patient before but he really does not want to take any further surgical intervention he mainly is just wanting to try let this heal its own way and he is happy with the way things are going at the moment. 02-23-2022 upon evaluation today patient appears to be doing well currently in regard to his wound all things considered. He still taking the doxycycline which I do have him on long-term in order to keep this under control. He was post to be on it by infectious disease but when that ran out I continued this as he has continued and ongoing chronic osteomyelitis of the ankle. He has been recommended amputation but  he does not want to proceed with that. He also is not a HBO candidate at this point. He also tells me that he really feels like it is "doing better even though the size everything is really about the same there is no significant improvement in general here. 03-09-2022 upon evaluation today patient appears to be doing decently well in regard to his wound. This is still probing down to bone however. Again we discussed treatment options which have always included amputation and IV antibiotics but which the patient has declined in the past. Subsequently we have had him on oral antibiotics for some time and this does seem to help to a degree. With that being said I do not see any evidence of infection locally or systemically right now although he does still have some drainage this is not doing nearly as poorly as what it had been in the past. With that being said I do think that doing an updated x-ray would not be a bad idea based on where we stand currently as well. That was discussed with the patient today. 03-23-2022 upon evaluation today patient's wound actually is showing signs of doing about the same in regard to the ankle region. Fortunately there does not appear to be any signs of active infection locally nor systemically at this time which is great news. With that being said I did review his x-rays and in fact reviewed the actual images as well. What I found was that I am concerned about the hardware loosening and to be honest I think that this is something that he probably needs to have either revision or removal of the hardware in general in order to try to allow this wound to heal with the hardware there and I feel like going to continue to have issues to be perfectly honest. I discussed that with him today and I would like for him to see Dr. Sanjuana Letters who is a local podiatrist whom I trust to see what he suggest about any possibility of infection from the hardware and whether or not the  hardware loosening could be potentially a cause here and something that could be fixed with intervention. 04-06-2022 upon evaluation today patient unfortunately has continued to have this waxing and waning as far as the wound size today it had somewhat scabbed over and there was a lot of fluid underneath and when this open the wound is again larger today compared to what it was last time I saw him. Again I do believe this likely represents an infected hardware/potential osteomyelitis type issue and I discussed that with him today. He wanted to see his x-rays which I did show him today in order to help him understand what we were dealing with and how things were internally. With that being said I explained that I am not a surgeon nor a radiologist. Nonetheless I have looked at a fair number of x-rays and I do believe that the potential for the fractured screw that is remaining and there Jeremy be a site of infection or to be honest any of the other hardware. Therefore I did want him to see a surgeon to discuss options and that is why I did send him to Dr. Sanjuana Letters who he is supposed to be seeing on Friday. 11/7; small wound but easily probes to bone. He has known chronic osteomyelitis and/or hardware infection in this area. He is on antibiotics. I am not certain about orthopedic follow-up.Scant drainage. 04-29-2022 upon evaluation today patient appears to be doing about the same in regard to his wound. He apparently did finally see Dr. Logan [name] although I have not had a chance to review that note as of yet. Fortunately there does not appear to be any signs of infection at this time which is great news. No fevers, chills, nausea, vomiting, or diarrhea. Objective Constitutional Well-nourished and well-hydrated in no acute distress. Vitals Time Taken: 12:06 PM, Height: 71 in, Weight: 250 lbs, BMI: 34.9, Temperature: 98.1 F, Pulse: 58 bpm, Respiratory Rate: 16 breaths/min, Blood Pressure: 164/97  mmHg. Respiratory normal breathing without difficulty. Psychiatric this patient is able to make decisions and demonstrates good insight into disease process. Alert and Oriented x 3. pleasant and cooperative. General Notes: Patient's wound bed actually showed signs of still having probing down to bone/hardware at this point. He tells me that Dr. Logan [name] had said that he could correct the issue and I think that is probably his best bet I am not sure that we can get this healed short of the surgical intervention. Nonetheless the patient seems to be still somewhat reluctant to go forward with surgery. Integumentary (Hair, Skin) Wound #3 status is Open. Original cause of wound was Gradually Appeared. The date acquired was: 08/24/2021. The wound has been in treatment 33 [name]. The wound is located on the Right,Lateral Ankle. The wound measures 0.4cm length x 0.3cm width x 0.5cm depth; 0.094cm^2 area and 0.047cm^3 volume. There is bone and Fat Layer (Subcutaneous Tissue) exposed. There is a medium amount of purulent drainage noted. There is medium (34-66%) pink granulation within the wound bed. There is a small (1-33%) amount of necrotic tissue within the wound bed including Adherent Slough. 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 Bathing/ Shower/ Hygiene: Jeremy shower;  gently cleanse wound with antibacterial soap, rinse and pat dry prior to dressing wounds No tub bath. Edema Control - Lymphedema / Segmental Compressive Device / Other: Elevate leg(s)  parallel to the floor when sitting. DO YOUR BEST to sleep in the bed at night. DO NOT sleep in your recliner. Long hours of sitting in a recliner leads to swelling of the legs and/or potential wounds on your backside. Additional Orders / Instructions: Follow Nutritious Diet and Increase Protein Intake - monitor blood glucose to maintain normal level Culture obtained today Medications-Please add to medication list.: P.O. Antibiotics - continue Doxycycline WOUND #3: - Ankle Wound Laterality: Right, Lateral Cleanser: Normal Saline 1 x Per Day/30 Days Discharge Instructions: Wash your hands with soap and water. Remove old dressing, discard into plastic bag and place into trash. Cleanse the wound with Normal Saline prior to applying a clean dressing using gauze sponges, not tissues or cotton balls. Do not scrub or use excessive force. Pat dry using gauze sponges, not tissue or cotton balls. Prim Dressing: Hydrofera Blue Classic Foam Rope Dressing, 9x6 (mm/in) 1 x Per Day/30 Days ary Discharge Instructions: cut it a point and place into wound Secondary Dressing: T Adhesive [employer], 4x4 (in/in) 1 x Per Day/30 Days elfa Discharge Instructions: Apply over dressing to secure in place. 1. I would recommend currently that we have the patient continue to monitor for any signs of worsening or infection. Obviously based on what I am seeing I do believe that he is basically had a stall in the location where the wound really is not good to do much more as far as closing. I think he has chronic osteomyelitis/hardware infection and without surgical intervention I doubt this is ever really good to truly heal and stay close. 2. I am good recommend as well that we have the patient continue with the Hydrofera Blue rope at this point I think he is doing excellent in that regard. 3. I would also recommend we continue with a T adhesive dressing to cover. elfa We will see patient back for reevaluation in 2  [name] here in the clinic. If anything worsens or changes patient will contact our office for additional recommendations. Electronic Signature(s) Signed: 04/29/2022 3:31:01 PM By: Jeremy Kelp PA-C Entered By: Jeremy [name] on 04/29/2022 15:31:01 -------------------------------------------------------------------------------- SuperBill Details Patient Name: Date of Service: RO Jeremy Ohm, MA RSHA LL 04/29/2022 Medical Record Number: [id number] Patient Account Number: [id number] Date of Birth/Sex: Treating RN: [birth year] (66 y.o. 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 E11.622 Type 2 diabetes mellitus with other skin ulcer I10 Essential (primary) hypertension I25.10 Atherosclerotic heart disease of native coronary artery without angina pectoris Facility Procedures : CPT4 Code: 78295621 Description: 99213 - WOUND CARE VISIT-LEV 3 EST PT Modifier: Quantity: 1 Physician Procedures : CPT4 Code Description Modifier [id number] 99213 - WC PHYS LEVEL 3 - EST PT ICD-10 Diagnosis Description M86.471 Chronic osteomyelitis with draining sinus, right ankle and foot L97.316 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

## 2022-05-11 ENCOUNTER — Other Ambulatory Visit: Payer: Self-pay

## 2022-05-11 ENCOUNTER — Emergency Department
Admission: EM | Admit: 2022-05-11 | Discharge: 2022-05-11 | Disposition: A | Discharge status: Home / Self Care | Payer: Medicaid Other | Attending: Emergency Medicine | Admitting: Emergency Medicine

## 2022-05-11 ENCOUNTER — Encounter: Payer: Medicaid Other | Admitting: Physician Assistant

## 2022-05-11 ENCOUNTER — Emergency Department: Payer: Medicaid Other

## 2022-05-11 DIAGNOSIS — R002 Palpitations: Secondary | ICD-10-CM | POA: Diagnosis present

## 2022-05-11 DIAGNOSIS — E1169 Type 2 diabetes mellitus with other specified complication: Secondary | ICD-10-CM | POA: Diagnosis not present

## 2022-05-11 DIAGNOSIS — I482 Chronic atrial fibrillation, unspecified: Secondary | ICD-10-CM | POA: Insufficient documentation

## 2022-05-11 DIAGNOSIS — I10 Essential (primary) hypertension: Secondary | ICD-10-CM | POA: Diagnosis not present

## 2022-05-11 DIAGNOSIS — I2510 Atherosclerotic heart disease of native coronary artery without angina pectoris: Secondary | ICD-10-CM | POA: Insufficient documentation

## 2022-05-11 DIAGNOSIS — Z7901 Long term (current) use of anticoagulants: Secondary | ICD-10-CM | POA: Insufficient documentation

## 2022-05-11 DIAGNOSIS — E119 Type 2 diabetes mellitus without complications: Secondary | ICD-10-CM | POA: Insufficient documentation

## 2022-05-11 LAB — COMPREHENSIVE METABOLIC PANEL
ALT: 10 U/L (ref 0–44)
AST: 17 U/L (ref 15–41)
Albumin: 4.3 g/dL (ref 3.5–5.0)
Alkaline Phosphatase: 64 U/L (ref 38–126)
Anion gap: 5 (ref 5–15)
BUN: 13 mg/dL (ref 8–23)
CO2: 24 mmol/L (ref 22–32)
Calcium: 10 mg/dL (ref 8.9–10.3)
Chloride: 110 mmol/L (ref 98–111)
Creatinine, Ser: 1.2 mg/dL (ref 0.61–1.24)
GFR, Estimated: >60 mL/min (ref >60)
Glucose, Bld: 95 mg/dL (ref 70–99)
Potassium: 4.3 mmol/L (ref 3.5–5.1)
Sodium: 139 mmol/L (ref 135–145)
Total Bilirubin: 0.6 mg/dL (ref 0.3–1.2)
Total Protein: 7.8 g/dL (ref 6.5–8.1)

## 2022-05-11 LAB — CBC
HCT: 45 % (ref 39.0–52.0)
Hemoglobin: 15 g/dL (ref 13.0–17.0)
MCH: 31 pg (ref 26.0–34.0)
MCHC: 33.3 g/dL (ref 30.0–36.0)
MCV: 93 fL (ref 80.0–100.0)
Platelets: 258 10*3/uL (ref 150–400)
RBC: 4.84 MIL/uL (ref 4.22–5.81)
RDW: 14.8 % (ref 11.5–15.5)
WBC: 5.8 10*3/uL (ref 4.0–10.5)
nRBC: 0 % (ref 0.0–0.2)

## 2022-05-11 LAB — TROPONIN I (HIGH SENSITIVITY): Troponin I (High Sensitivity): 14 ng/L (ref <18)

## 2022-05-11 MED ORDER — LOSARTAN POTASSIUM 50 MG PO TABS: 50.0000 mg | ORAL_TABLET | Freq: Every day | ORAL | 1 refills | Status: DC

## 2022-05-11 NOTE — ED Provider Triage Note (Signed)
Emergency Medicine Provider Triage Evaluation Note  Jeremy [name] , a 66 y.o. male  was evaluated in triage.  Pt complains of cough, palpitations.  Review of Systems  Positive:  Negative:   Physical Exam  BP (!) 205/103 (BP Location: Left Arm)   Pulse 86   Temp 97.7 F (36.5 C) (Oral)   Resp 18   Ht 5\' 11"  (1.803 m)   Wt 108.9 kg   SpO2 98%   BMI 33.47 kg/m  Gen:   Awake, no distress   Resp:  Normal effort  MSK:   Moves extremities without difficulty  Other:    Medical Decision Making  Medically screening exam initiated at 11:34 AM.  Appropriate orders placed.  Jeremy [name] was informed that the remainder of the evaluation will be completed by another provider, this initial triage assessment does not replace that evaluation, and the importance of remaining in the ED until their evaluation is complete.     [name], PA-C 05/11/22 1135

## 2022-05-11 NOTE — Discharge Instructions (Signed)
We discussed please begin taking her blood pressure medication as prescribed.  Please follow-up with your primary care doctor by calling them today to inform them of today's ER visit and your high blood pressure.  Your blood pressure medication has been refilled for the next 2 months.  Return to the emergency department for any chest pain or any other symptom personally concerning to yourself.

## 2022-05-11 NOTE — Progress Notes (Addendum)
Jeremy [name], Jeremy [name] ([id number]) [id number] Page 1 of 9 Visit Report for 05/11/2022 Chief Complaint Document Details Patient Name: Date of Service: Jeremy [name] 05/11/2022 9:00 A M Medical Record Number: [id number] Patient Account Number: [id number] Date of Birth/Sex: Treating RN: 17-Jun-1955 (66 y.o. Jeremy [name]) Jeremy [name] Primary Care Provider: Tracie [name] Other Clinician: Massie [name] Referring Provider: Treating Provider/Extender: Jeremy [name] Weeks in Treatment: 60 Information Obtained from: Patient Chief Complaint Right ankle ulcer Electronic Signature(s) Signed: 05/11/2022 9:14:07 AM By: Worthy Keeler PA-C Entered By: Worthy [name] on 05/11/2022 09:14:07 -------------------------------------------------------------------------------- HPI Details Patient Name: Date of Service: Jeremy Jeremy [name], Becker [name] 05/11/2022 9:00 A M Medical Record Number: [id number] Patient Account Number: [id number] Date of Birth/Sex: Treating RN: 1955/12/14 (66 y.o. Jeremy [name]) Jeremy [name] Primary Care Provider: Tracie [name] Other Clinician: Massie [name] Referring Provider: Treating Provider/Extender: Jeremy [name] Weeks in Treatment: 35 History of Present Illness HPI Description: 08/18/2020 upon evaluation today patient presents for initial inspection here in our clinic concerning issues has been having with his right lateral ankle and this has been present for at least a year he tells me. He is a patient of Dr. Elvina Mattes. Subsequently Dr. Elvina Mattes has since retired hence the patient look this up and is coming here for wound care to see if we can help him out. He did have a screw pushed out of this location and since that time tells me that the screw was removed but nonetheless the hole has remained. Fortunately there does not appear to be any signs of active infection systemically at this point though it does raise the question as to whether  or not this might be a bone/hardware infection being that this has been open for so long. The patient does have a history of diabetes mellitus type 2, coronary artery disease, hypertension, and unfortunately does not seem to be doing as great as I would like to see him regard to his left ankle. 08/25/2020 upon evaluation today patient appears to be doing [name] with regard to his ankle ulcer compared to last week this is definitely smaller. With that being said it still does probe down to bone/hardware. I still think this can be appropriate for him to see the orthopedic specialist. He has not heard from them yet he tells me. 09/05/2020 upon evaluation today patient appears to be doing extremely [name] in regard to his wound all things considered. I do not see that this is gotten any worse. Unfortunately also do not think he is gotten any better. We did make a referral to orthopedics to Dr. Doran Durand specifically but the patient tells me that he has not heard from them. That is unusual as they normally get in touch with patients fairly quickly. I will ask him if he possibly missed a phone call he tells me he will check when he gets home. Nonetheless he does not want to go to [facility] anyway which he did not tell me previously therefore we will get a see about making a referral to Dr. Amalia Hailey to see what he potentially could recommend for the patient as [name] I think Dr. Amalia Hailey is excellent and a good option here. Jeremy [name] ([id number]) [id number] Page 2 of 9 Readmission: 10/28/2020 upon evaluation today patient appears to be doing about the same as when I last saw him. He did not come back for follow-up last time I saw him was 09/05/2020. Basically he tells me that he has been attempting to manage this on  his own using the Hydrofera Blue rope. He did get a call from [facility] but they did not actually get him scheduled due to the fact that the patient told me that he told  them he did not wanted to see them he just wanted to come back and see me because he was happy with my care. Nonetheless as I explained to the patient he has a fractured screw at the site of the wound he also has another screw where there appears to be some loosening of the screw and potential for hardware/bone infection here. Subsequently I think he really does need to see a specialist to see what they can do to help him out I do not believe him to be able to get this healed without taking care of the hardware issues. 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 He has been advised previous of a need for an above-knee amputation due to some of the aggressive breakdown in the ankle region. Nonetheless he has had the removal of the screw performed in office with Dr. Excell Seltzer and this was on 08/28/2021. Subsequently the patient is a poor historian he also gets upset very easily. Of note I do believe that he is going to require some dressings to be packed into the area due to the fact that to be honest this is still tracking all the way straight down to bone where there was pus and purulence coming out once I did get down to that area. Subsequently he tells me that he is having pain although it does seem to be doing better since the screw was removed. 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 Therefore his best options probably do entail the continue following up by Dr. Sampson Goon for infectious disease coupled with wound care as best we can and try to see if we keep this clean as much as possible and keep it from becoming more infected. At some point there may be a time where this could continually get worse and may end up not doing nearly as [name] but right now he  does not seem to be doing too poorly and I think this is the best way to support him. I am not overly confident that this is ever getting heal however. 09/15/21 upon evaluation today patient appears to be doing [name] with regard to his wound. He has been tolerating the dressing changes without complication. Fortunately I do not see any signs of active infection at this time. Overall I think that he is doing quite [name]. 09-22-2021 upon evaluation today patient appears to be doing about the same in regard to his ankle. 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 He again has been told that there is probably not a likely scenario in which this is able to heal. Nonetheless he is not a HBO candidate due to low ejection fraction. He does see Dr. Sampson Goon he still taking antibiotics at this point we will try to keep the area open so does not develop an abscess. 09-29-2021 upon evaluation today patient appears to be doing [name] with regard to his wound all things considered. He continues to use the [employer]  rope. This is still a fairly deep wound that I think the rope is doing [name] to help keep the drainage from collecting in the base of the wound there does appear to be less drainage definitely not as purulent as what we have noticed in the past. There is no increased pain and no signs of worsening in general. 10-06-2021 upon evaluation today patient's wound is continuing to show signs of issues here at this point. Fortunately I do not see any evidence of active infection locally or systemically which is great news but at the same time he is definitely having some ongoing problems here currently. 5/8; 2-week follow-up. Right medial ankle. Small wound purulent drainage. He is on doxycycline chronically for underlying osteomyelitis. He has hardware in this area. He is using Hydrofera Blue packing strips 11-02-2021 upon  evaluation today patient appears to be doing much better in regard to his wound I am very pleased to see this. I do not see any evidence of active infection locally nor systemically which is great news. No fevers, chills, nausea, vomiting, or diarrhea. 11-16-2021 upon evaluation today patient appears to be doing [name] with regard to his ankle ulcer this is a very tiny area remaining at this point. Fortunately I do not see any evidence of infection locally or systemically at this time. Overall I think we are on the right track. 11-30-2021 Upon inspection patient's wound bed actually showed signs of having just a very small opening that still between 1 to 2 mm max. The back end of the sterile Q-tip stick I was able to get down in here and it does still probe down to bone. With that being said he still has drainage as [name]. Nonetheless he has not wanted any further surgical intervention and so mainly is just more of something were doing to try to manage this as best we can do only thing really were doing a split Hydrofera Blue externally has a lot of new skin covering over where previously had a lot of open area but at the same time I am still concerned about the fact this probes all the way down to bone. 12-14-2021 upon evaluation today patient appears to be doing about the same in regard to his wound. This still actually probes down to bone. He has not seen ID anytime recently. Has been off of the doxycycline for quite some time. For that reason I really feel like we may need to see about getting something done a little bit more specific for him here and when to try to obtain a culture today so that we can see what that shows and maybe get him on antibiotics that could help this to heal faster. He is in agreement with that plan. 12-28-2021 upon evaluation today patient appears to be doing worse in regard to his wound. Unfortunately this has sealed up and does appear to be trapping quite a bit of fluid  underneath the skin the skin is loose and honestly needs to be trimmed away I discussed that with the patient today. He voiced understanding and did consent to the debridement. This is good to be done with scissors and forceps. 01-11-2022 upon evaluation today patient's wound actually is showing signs of being a little better than last time I saw him. I do believe that the doxycycline has been effective in calming this down to some degree. Fortunately I do not see any evidence of active infection at this time systemically although locally he is actually having  some signs of cellulitis though this is much better compared to 2 weeks ago when I last saw him. 01-25-2022 upon evaluation today patient appears to be doing [name] with regard to his foot ulcer all things considered. He still has a wound that goes deeper I think switching back to the [facility] rope is probably can to be better for him at this point. 02-08-2022 upon evaluation today patient appears to be doing [name] currently in regard to his wound all things considered. This is still chronic osteomyelitis issue at least from the previous evaluations. He has previously been on antibiotics for an extended period of time. Right now I have a Mannam which I started back on July 17 and that was for 2 refills he is still taking that. There was an initial 30-day supply. That should actually carry him from July 17 to October 17. With that being said I am really not sure that we are seeing a whole lot of improvement we seem to have some waxing and waning but would not ever really completely seeing this healed unfortunately. I do think the Hydrofera Blue rope is the best way to go however this still is going all the way down to bone and that has me concerned. I discussed this with the patient before but he really does not want to take any further surgical intervention he mainly is just wanting to try let this heal its own way and he is happy with the way  things are going at the moment. 02-23-2022 upon evaluation today patient appears to be doing [name] currently in regard to his wound all things considered. He still taking the doxycycline which I do have him on long-term in order to keep this under control. He was post to be on it by infectious disease but when that ran out I continued this as he has continued and ongoing chronic osteomyelitis of the ankle. He has been recommended amputation but he does not want to proceed with that. He also is not a HBO candidate at this point. He also tells me that he really feels like it is "doing better even though the size everything is really about the same there is no significant improvement in general here. 03-09-2022 upon evaluation today patient appears to be doing decently [name] in regard to his wound. This is still probing down to bone however. Again we discussed treatment options which have always included amputation and IV antibiotics but which the patient has declined in the past. Subsequently we have had him on oral antibiotics for some time and this does seem to help to a degree. With that being said I do not see any evidence of infection locally or systemically right now although he does still have some drainage this is not doing nearly as poorly as what it had been in the past. With that being said I do think that doing an updated x-ray would not be a bad idea based on where we stand currently as [name]. That was discussed with the patient today. 03-23-2022 upon evaluation today patient's wound actually is showing signs of doing about the same in regard to the ankle region. Fortunately there does not appear to be any signs of active infection locally nor systemically at this time which is great news. With that being said I did review his x-rays and in fact Jeremy [name], Jeremy [name] ([id number]) [id number] Page 3 of 9 reviewed the actual images as [name]. What I found was that I am concerned  about  the hardware loosening and to be honest I think that this is something that he probably needs to have either revision or removal of the hardware in general in order to try to allow this wound to heal with the hardware there and I feel like going to continue to have issues to be perfectly honest. I discussed that with him today and I would like for him to see Dr. Merril Abbe who is a local podiatrist whom I trust to see what he suggest about any possibility of infection from the hardware and whether or not the hardware loosening could be potentially a cause here and something that could be fixed with intervention. 04-06-2022 upon evaluation today patient unfortunately has continued to have this waxing and waning as far as the wound size today it had somewhat scabbed over and there was a lot of fluid underneath and when this open the wound is again larger today compared to what it was last time I saw him. Again I do believe this likely represents an infected hardware/potential osteomyelitis type issue and I discussed that with him today. He wanted to see his x-rays which I did show him today in order to help him understand what we were dealing with and how things were internally. With that being said I explained that I am not a surgeon nor a radiologist. Nonetheless I have looked at a fair number of x-rays and I do believe that the potential for the fractured screw that is remaining and there may be a site of infection or to be honest any of the other hardware. Therefore I did want him to see a surgeon to discuss options and that is why I did send him to Dr. Merril Abbe who he is supposed to be seeing on Friday. 11/7; small wound but easily probes to bone. He has known chronic osteomyelitis and/or hardware infection in this area. He is on antibiotics. I am not certain about orthopedic follow-up.Scant drainage. 04-29-2022 upon evaluation today patient appears to be doing about the same in regard to  his wound. He apparently did finally see Dr. Amalia Hailey although I have not had a chance to review that note as of yet. Fortunately there does not appear to be any signs of infection at this time which is great news. No fevers, chills, nausea, vomiting, or diarrhea. 05-11-2022 upon evaluation today patient appears to be doing [name] currently in regard to his wound although this is still stable its about the same. Fortunately there does not appear to be any signs of infection at this time which is great news. No fevers, chills, nausea, vomiting, or diarrhea. 05-11-2022 upon evaluation today patient is pretty much the same as far as his wounds are concerned. I do not see any evidence of infection or worsening overall and in general I think that he is really stalled I do believe he needs to proceed with the surgery with Dr. Amalia Hailey I think best the only way that he is in to be able to get this to heal as I am firmly convinced that he does have chronic osteomyelitis as [name] as hardware infection and get the hardware removed this probably can be the only way that he is going to see this closed. I discussed this with him multiple times in the past he has been resistant to proceeding with the surgery however. Electronic Signature(s) Signed: 05/11/2022 2:53:54 PM By: Worthy Keeler PA-C Entered By: Worthy [name] on 05/11/2022 14:53:54 -------------------------------------------------------------------------------- Physical Exam Details Patient  Name: Date of Service: Jeremy [name], [place] RSHA [name] 05/11/2022 9:00 A M Medical Record Number: [id number] Patient Account Number: [id number] Date of Birth/Sex: Treating RN: 10-07-1955 (65 y.o. Jeremy [name]) Jeremy [name] Primary Care Provider: Tracie [name] Other Clinician: Massie [name] Referring Provider: Treating Provider/Extender: Nadara Mustard, Vishwanath Weeks in Treatment: 25 Constitutional [name]-nourished and [name]-hydrated in no acute distress. Respiratory normal  breathing without difficulty. Psychiatric this patient is able to make decisions and demonstrates good insight into disease process. Alert and Oriented x 3. pleasant and cooperative. Notes Upon inspection patient's wound bed actually showed signs again of being about the same. 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 I do think that he should proceed with the surgery with Dr. Merril Abbe who has suggested a surgical solution to this problem. The patient voiced understanding. Electronic Signature(s) Signed: 05/11/2022 2:54:29 PM By: Worthy Keeler PA-C Entered By: Worthy [name] on 05/11/2022 14:54:29 Jeremy [name] ([id number]) [id number] Page 4 of 9 -------------------------------------------------------------------------------- Physician Orders Details Patient Name: Date of Service: Jeremy [name], [place] RSHA [name] 05/11/2022 9:00 A M Medical Record Number: [id number] Patient Account Number: [id number] Date of Birth/Sex: Treating RN: May 31, 1956 (65 y.o. 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 Bathing/ Shower/ Hygiene May shower; gently cleanse wound with antibacterial soap, rinse and pat dry prior to  dressing wounds No tub bath. Edema Control - Lymphedema / Segmental Compressive Device / Other Elevate leg(s) parallel to the floor when sitting. DO YOUR BEST to sleep in the bed at night. DO NOT sleep in your recliner. Long hours of sitting in a recliner leads to swelling of the legs and/or potential wounds on your backside. Additional Orders / Instructions Follow Nutritious Diet and Increase Protein Intake - monitor blood glucose to maintain normal level Medications-Please add to medication list. ntibiotics - continue Doxycycline P.O. A Wound Treatment Wound #3 - Ankle Wound Laterality: Right, Lateral Cleanser: Normal Saline 1 x Per Day/30 Days Discharge Instructions: Wash your hands with soap and water. Remove old dressing, discard into plastic bag and place into trash. Cleanse the wound with Normal Saline prior to applying a clean dressing using gauze sponges, not tissues or cotton balls. Do not scrub or use excessive force. Pat dry using gauze sponges, not tissue or cotton balls. Prim Dressing: Hydrofera Blue Classic Foam Rope Dressing, 9x6 (mm/in) ary 1 x Per Day/30 Days Discharge Instructions: cut it a point and place into wound Secondary Dressing: T Adhesive [employer], 4x4 (in/in) 1 x Per Day/30 Days elfa Discharge Instructions: Apply over dressing to secure in place. Electronic Signature(s) Signed: 05/11/2022 4:53:03 PM By: Worthy Keeler PA-C Signed: 05/11/2022 5:05:44 PM By: Jeremy [name] Entered By: Jeremy [name] on 05/11/2022 09:37:05 Jeremy [name] ([id number]) [id number] Page 5 of 9 -------------------------------------------------------------------------------- Problem List Details Patient Name: Date of Service: Jeremy [name], [place] RSHA [name] 05/11/2022 9:00 A M Medical Record Number:  [id number] Patient Account Number: [id number] Date of Birth/Sex: Treating RN: 1955-09-01 (65 y.o. 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 without 09/07/2021 No Yes evidence of necrosis E11.622 Type 2 diabetes mellitus with other skin ulcer 09/07/2021 No Yes I10 Essential (primary) hypertension 09/07/2021 No Yes I25.10 Atherosclerotic heart disease of native coronary artery without angina pectoris 09/07/2021 No Yes Inactive Problems Resolved Problems Electronic Signature(s) Signed: 05/11/2022 9:13:59 AM By: Worthy Keeler PA-C Entered By: Worthy [name] on 05/11/2022 09:13:59 -------------------------------------------------------------------------------- Progress Note Details Patient Name: Date of Service: Jeremy WELL, Jeremy [name] RSHA [name] 05/11/2022 9:00 A M Medical Record Number: [id number] Patient Account Number: [id number] Date of Birth/Sex: Treating RN: 07-29-1955 ([address] y.o. [place], [place] ([id number]) [phone].pdf Page 6 of 9 Primary Care Provider: Tracie [name] Other Clinician: Massie [name] Referring Provider: Treating Provider/Extender: Jeremy [name] Weeks in Treatment: 37 Subjective Chief Complaint Information obtained from Patient Right ankle ulcer History of Present Illness (HPI) 08/18/2020 upon evaluation today patient presents for initial inspection here in our clinic concerning issues has been having with his right lateral ankle and this has been present for at least a year he tells me. He is a patient of Dr. Elvina Mattes. Subsequently Dr. Elvina Mattes has since retired hence the patient look this up and is coming here for wound care to see if we can help him out. He did have a screw  pushed out of this location and since that time tells me that the screw was removed but nonetheless the hole has remained. Fortunately there does not appear to be any signs of active infection systemically at this point though it does raise the question as to whether or not this might be a bone/hardware infection being that this has been open for so long. The patient does have a history of diabetes mellitus type 2, coronary artery disease, hypertension, and unfortunately does not seem to be doing as great as I would like to see him regard to his left ankle. 08/25/2020 upon evaluation today patient appears to be doing [name] with regard to his ankle ulcer compared to last week this is definitely smaller. With that being said it still does probe down to bone/hardware. I still think this can be appropriate for him to see the orthopedic specialist. He has not heard from them yet he tells me. 09/05/2020 upon evaluation today patient appears to be doing extremely [name] in regard to his wound all things considered. I do not see that this is gotten any worse. Unfortunately also do not think he is gotten any better. We did make a referral to orthopedics to Dr. Doran Durand specifically but the patient tells me that he has not heard from them. That is unusual as they normally get in touch with patients fairly quickly. I will ask him if he possibly missed a phone call he tells me he will check when he gets home. Nonetheless he does not want to go to [facility] anyway which he did not tell me previously therefore we will get a see about making a referral to Dr. Amalia Hailey to see what he potentially could recommend for the patient as [name] I think Dr. Amalia Hailey is excellent and a good option here.  Readmission: 10/28/2020 upon evaluation today patient appears to be doing about the same as when I last saw him. He did not come back for follow-up last time I saw him was 09/05/2020. Basically he tells me that he has been attempting to manage  this on his own using the Hydrofera Blue rope. He did get a call from [facility] but they did not actually get him scheduled due to the fact that the patient told me that he told them he did not wanted to see them he just wanted to come back and see me because he was happy with my care. Nonetheless as I explained to the patient he has a fractured screw at the site of the wound he also has another screw where there appears to be some loosening of the screw and potential for hardware/bone infection here. Subsequently I think he really does need to see a specialist to see what they can do to help him out I do not believe him to be able to get this healed without taking care of the hardware issues. 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 He has been advised previous of a need for an above-knee amputation due to some of the aggressive breakdown in the ankle region. Nonetheless he has had the removal of the screw performed in office with Dr. Excell Seltzer and this was on 08/28/2021. Subsequently the patient is a poor historian he also gets upset very easily. Of note I do believe that he is going to require some dressings to be packed into the area due to the fact that to be honest this is still tracking all the way straight down to bone where there was pus and purulence coming out once I did get down to that area. Subsequently he tells me that he is having pain although it does seem to be doing better since the screw was removed. 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 Therefore his best options probably do entail the continue following up by Dr. Sampson Goon for infectious disease coupled with wound care as best we can and try to see if we keep this clean as much  as possible and keep it from becoming more infected. At some point there may be a time where this could continually get worse and may end up not doing nearly as [name] but right now he does not seem to be doing too poorly and I think this is the best way to support him. I am not overly confident that this is ever getting heal however. 09/15/21 upon evaluation today patient appears to be doing [name] with regard to his wound. He has been tolerating the dressing changes without complication. Fortunately I do not see any signs of active infection at this time. Overall I think that he is doing quite [name]. 09-22-2021 upon evaluation today patient appears to be doing about the same in regard to his ankle. 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 He again has been told that there is probably not a likely scenario in which this is able to heal. Nonetheless he is not a HBO candidate due to low ejection fraction. He does see Dr. Sampson Goon  he still taking antibiotics at this point we will try to keep the area open so does not develop an abscess. 09-29-2021 upon evaluation today patient appears to be doing [name] with regard to his wound all things considered. He continues to use the [employer] rope. This is still a fairly deep wound that I think the rope is doing [name] to help keep the drainage from collecting in the base of the wound there does appear to be less drainage definitely not as purulent as what we have noticed in the past. There is no increased pain and no signs of worsening in general. 10-06-2021 upon evaluation today patient's wound is continuing to show signs of issues here at this point. Fortunately I do not see any evidence of active infection locally or systemically which is great news but at the same time he is definitely having some ongoing problems here currently. 5/8; 2-week follow-up. Right medial ankle. Small  wound purulent drainage. He is on doxycycline chronically for underlying osteomyelitis. He has hardware in this area. He is using Hydrofera Blue packing strips 11-02-2021 upon evaluation today patient appears to be doing much better in regard to his wound I am very pleased to see this. I do not see any evidence of active infection locally nor systemically which is great news. No fevers, chills, nausea, vomiting, or diarrhea. 11-16-2021 upon evaluation today patient appears to be doing [name] with regard to his ankle ulcer this is a very tiny area remaining at this point. Fortunately I do not see any evidence of infection locally or systemically at this time. Overall I think we are on the right track. 11-30-2021 Upon inspection patient's wound bed actually showed signs of having just a very small opening that still between 1 to 2 mm max. The back end of the sterile Q-tip stick I was able to get down in here and it does still probe down to bone. With that being said he still has drainage as [name]. Nonetheless he has not wanted any further surgical intervention and so mainly is just more of something were doing to try to manage this as best we can do only thing really were doing a split Hydrofera Blue externally has a lot of new skin covering over where previously had a lot of open area but at the same time I am still concerned about the fact this probes all the way down to bone. 12-14-2021 upon evaluation today patient appears to be doing about the same in regard to his wound. This still actually probes down to bone. He has not seen ID anytime recently. Has been off of the doxycycline for quite some time. For that reason I really feel like we may need to see about getting something done a little bit more specific for him here and when to try to obtain a culture today so that we can see what that shows and maybe get him on antibiotics that could help this to heal faster. He is in agreement with that  plan. 12-28-2021 upon evaluation today patient appears to be doing worse in regard to his wound. Unfortunately this has sealed up and does appear to be trapping quite a bit of fluid underneath the skin the skin is loose and honestly needs to be trimmed away I discussed that with the patient today. He voiced Jeremy [name], Jeremy [name] ([id number]) [id number] Page 7 of 9 understanding and did consent to the debridement. This is good to be done with scissors and forceps. 01-11-2022 upon evaluation  today patient's wound actually is showing signs of being a little better than last time I saw him. I do believe that the doxycycline has been effective in calming this down to some degree. Fortunately I do not see any evidence of active infection at this time systemically although locally he is actually having some signs of cellulitis though this is much better compared to 2 weeks ago when I last saw him. 01-25-2022 upon evaluation today patient appears to be doing [name] with regard to his foot ulcer all things considered. He still has a wound that goes deeper I think switching back to the [facility] rope is probably can to be better for him at this point. 02-08-2022 upon evaluation today patient appears to be doing [name] currently in regard to his wound all things considered. This is still chronic osteomyelitis issue at least from the previous evaluations. He has previously been on antibiotics for an extended period of time. Right now I have a Mannam which I started back on July 17 and that was for 2 refills he is still taking that. There was an initial 30-day supply. That should actually carry him from July 17 to October 17. With that being said I am really not sure that we are seeing a whole lot of improvement we seem to have some waxing and waning but would not ever really completely seeing this healed unfortunately. I do think the Hydrofera Blue rope is the best way to go however this still  is going all the way down to bone and that has me concerned. I discussed this with the patient before but he really does not want to take any further surgical intervention he mainly is just wanting to try let this heal its own way and he is happy with the way things are going at the moment. 02-23-2022 upon evaluation today patient appears to be doing [name] currently in regard to his wound all things considered. He still taking the doxycycline which I do have him on long-term in order to keep this under control. He was post to be on it by infectious disease but when that ran out I continued this as he has continued and ongoing chronic osteomyelitis of the ankle. He has been recommended amputation but he does not want to proceed with that. He also is not a HBO candidate at this point. He also tells me that he really feels like it is "doing better even though the size everything is really about the same there is no significant improvement in general here. 03-09-2022 upon evaluation today patient appears to be doing decently [name] in regard to his wound. This is still probing down to bone however. Again we discussed treatment options which have always included amputation and IV antibiotics but which the patient has declined in the past. Subsequently we have had him on oral antibiotics for some time and this does seem to help to a degree. With that being said I do not see any evidence of infection locally or systemically right now although he does still have some drainage this is not doing nearly as poorly as what it had been in the past. With that being said I do think that doing an updated x-ray would not be a bad idea based on where we stand currently as [name]. That was discussed with the patient today. 03-23-2022 upon evaluation today patient's wound actually is showing signs of doing about the same in regard to the ankle region. Fortunately there does not  appear to be any signs of active infection locally  nor systemically at this time which is great news. With that being said I did review his x-rays and in fact reviewed the actual images as [name]. What I found was that I am concerned about the hardware loosening and to be honest I think that this is something that he probably needs to have either revision or removal of the hardware in general in order to try to allow this wound to heal with the hardware there and I feel like going to continue to have issues to be perfectly honest. I discussed that with him today and I would like for him to see Dr. Merril Abbe who is a local podiatrist whom I trust to see what he suggest about any possibility of infection from the hardware and whether or not the hardware loosening could be potentially a cause here and something that could be fixed with intervention. 04-06-2022 upon evaluation today patient unfortunately has continued to have this waxing and waning as far as the wound size today it had somewhat scabbed over and there was a lot of fluid underneath and when this open the wound is again larger today compared to what it was last time I saw him. Again I do believe this likely represents an infected hardware/potential osteomyelitis type issue and I discussed that with him today. He wanted to see his x-rays which I did show him today in order to help him understand what we were dealing with and how things were internally. With that being said I explained that I am not a surgeon nor a radiologist. Nonetheless I have looked at a fair number of x-rays and I do believe that the potential for the fractured screw that is remaining and there may be a site of infection or to be honest any of the other hardware. Therefore I did want him to see a surgeon to discuss options and that is why I did send him to Dr. Merril Abbe who he is supposed to be seeing on Friday. 11/7; small wound but easily probes to bone. He has known chronic osteomyelitis and/or hardware infection in  this area. He is on antibiotics. I am not certain about orthopedic follow-up.Scant drainage. 04-29-2022 upon evaluation today patient appears to be doing about the same in regard to his wound. He apparently did finally see Dr. Amalia Hailey although I have not had a chance to review that note as of yet. Fortunately there does not appear to be any signs of infection at this time which is great news. No fevers, chills, nausea, vomiting, or diarrhea. 05-11-2022 upon evaluation today patient appears to be doing [name] currently in regard to his wound although this is still stable its about the same. Fortunately there does not appear to be any signs of infection at this time which is great news. No fevers, chills, nausea, vomiting, or diarrhea. 05-11-2022 upon evaluation today patient is pretty much the same as far as his wounds are concerned. I do not see any evidence of infection or worsening overall and in general I think that he is really stalled I do believe he needs to proceed with the surgery with Dr. Amalia Hailey I think best the only way that he is in to be able to get this to heal as I am firmly convinced that he does have chronic osteomyelitis as [name] as hardware infection and get the hardware removed this probably can be the only way that he is going to see  this closed. I discussed this with him multiple times in the past he has been resistant to proceeding with the surgery however. Objective Constitutional [name]-nourished and [name]-hydrated in no acute distress. Vitals Time Taken: 9:09 AM, Height: 71 in, Weight: 250 lbs, BMI: 34.9, Temperature: 97.4 F, Pulse: 48 bpm, Respiratory Rate: 16 breaths/min, Blood Pressure: 196/98 mmHg. General Notes: Patient states "Ive had a slight headache for the past couple of days, but I feel fine" Provider notified of BP Respiratory normal breathing without difficulty. Psychiatric this patient is able to make decisions and demonstrates good insight into disease process.  Alert and Oriented x 3. pleasant and cooperative. General Notes: Upon inspection patient's wound bed actually showed signs again of being about the same. 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 I do think that he should proceed with the surgery with Dr. Merril Abbe who has suggested a surgical solution to this problem. The patient voiced understanding. Integumentary (Hair, Skin) Jeremy [name], Jeremy [name] ([id number]) [id number] Page 8 of 9 Wound #3 status is Open. Original cause of wound was Gradually Appeared. The date acquired was: 08/24/2021. The wound has been in treatment 35 weeks. The wound is located on the Right,Lateral Ankle. The wound measures 0.5cm length x 0.4cm width x 0.3cm depth; 0.157cm^2 area and 0.047cm^3 volume. There is bone and Fat Layer (Subcutaneous Tissue) exposed. There is no undermining noted, however, there is tunneling at 1:00 with a maximum distance of 1.2cm. There is a medium amount of purulent drainage noted. There is medium (34-66%) pink granulation within the wound bed. There is a small (1-33%) amount of necrotic tissue within the wound bed. 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 Bathing/ Shower/ Hygiene: May shower; gently cleanse wound with antibacterial soap, rinse and pat dry prior to dressing wounds No tub bath. Edema Control - Lymphedema / Segmental Compressive Device / Other: Elevate leg(s) parallel to the floor when sitting. DO  YOUR BEST to sleep in the bed at night. DO NOT sleep in your recliner. Long hours of sitting in a recliner leads to swelling of the legs and/or potential wounds on your backside. Additional Orders / Instructions: Follow Nutritious Diet and Increase Protein Intake - monitor blood glucose to maintain normal level Medications-Please add to medication list.: P.O. Antibiotics - continue Doxycycline WOUND #3: - Ankle Wound Laterality: Right, Lateral Cleanser: Normal Saline 1 x Per Day/30 Days Discharge Instructions: Wash your hands with soap and water. Remove old dressing, discard into plastic bag and place into trash. Cleanse the wound with Normal Saline prior to applying a clean dressing using gauze sponges, not tissues or cotton balls. Do not scrub or use excessive force. Pat dry using gauze sponges, not tissue or cotton balls. Prim Dressing: Hydrofera Blue Classic Foam Rope Dressing, 9x6 (mm/in) 1 x Per Day/30 Days ary Discharge Instructions: cut it a point and place into wound Secondary Dressing: T Adhesive [employer], 4x4 (in/in) 1 x Per Day/30 Days elfa Discharge Instructions: Apply over dressing to secure in place. 1. Patient today tells me that I have convinced him that he should proceed  with the surgery and he feels like that probably is the best thing really all I did was discussed with him the fact that if he did not have the surgery he was probably not going to heal and that the concern would be that he may develop into a situation where he would actually end up losing his leg. Again that is not something that I want to see happen and is not something that he wants to see happen. For that reason we did go ahead and discussed going forward with the surgery and he tells me he is good to do so. 2. For the time being we will get a continue with the Hydrofera Blue rope which I think is still probably the best way to go overall I think he is doing quite [name]. We will see patient back for  reevaluation in 1 week here in the clinic. If anything worsens or changes patient will contact our office for additional recommendations. Electronic Signature(s) Signed: 05/11/2022 2:56:17 PM By: Lenda Kelp PA-C Entered By: Lenda Kelp on 05/11/2022 14:56:16 -------------------------------------------------------------------------------- SuperBill Details Patient Name: Date of Service: [address] Jeremy [name] RSHA [name] 05/11/2022 Crane, Gaynell Face ([id number]) [id number] Page 9 of 9 Medical Record Number: [id number] Patient Account Number: [id number] Date of Birth/Sex: Treating RN: 03/15/1956 (65 y.o. 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 E11.622 Type 2 diabetes mellitus with other skin ulcer I10 Essential (primary) hypertension I25.10 Atherosclerotic heart disease of native coronary artery without angina pectoris Facility Procedures : CPT4 Code: [id number] Description: 99213 - WOUND CARE VISIT-LEV 3 EST PT Modifier: Quantity: 1 Physician Procedures : CPT4 Code Description Modifier [id number] - WC PHYS LEVEL 3 - EST PT ICD-10 Diagnosis Description M86.471 Chronic osteomyelitis with draining sinus, right ankle and foot L97.316 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

## 2022-05-11 NOTE — Progress Notes (Signed)
Jeremy [name] ([id number]) [id number] Page 1 of 9 Visit Report for 05/11/2022 Arrival Information Details Patient Name: Date of Service: Jeremy [name], Jeremy [name] 05/11/2022 9:00 A M Medical Record Number: [id number] Patient Account Number: [id number] Date of Birth/Sex: Treating RN: 06-23-1955 (65 y.o. Jeremy [name]) Jeremy [name] Primary Care Jeremy [name]: Jeremy [name] Other Clinician: Betha [name] Referring Jeremy [name]: Treating Jeremy [name]/Extender: Jeremy [name] Weeks in [name]: 35 Visit Information History Since Last Visit All ordered tests and consults were completed: No Patient Arrived: Ambulatory Added or deleted any medications: No Arrival Time: 09:04 Any new allergies or adverse reactions: No Transfer Assistance: None Had a fall or experienced change in No Patient Identification Verified: Yes activities of daily living that may affect Secondary Verification Process Completed: Yes risk of falls: Patient Requires Transmission-Based Precautions: No Signs or symptoms of abuse/neglect since last visito No Patient Has Alerts: Yes Hospitalized since last visit: No Patient Alerts: Patient [name] Blood Thinner Implantable device outside of the clinic excluding No Eliquis cellular tissue based products placed in the center since last visit: Has Dressing in Place as Prescribed: Yes Pain Present Now: No Electronic Signature(s) Signed: 05/11/2022 5:05:44 PM By: Jeremy [name] Entered By: Jeremy [name] 05/11/2022 09:08:52 -------------------------------------------------------------------------------- Clinic Level of Care Assessment Details Patient Name: Date of Service: Jeremy [name], Jeremy [name] 05/11/2022 9:00 A M Medical Record Number: [id number] Patient Account Number: [id number] Date of Birth/Sex: Treating RN: 13-Mar-1956 (65 y.o. Jeremy [name]) Jeremy [name] Primary Care Estephani Popper: Jeremy [name] Other Clinician: Betha [name] Referring Jeremy [name]: Treating  Jeremy [name]/Extender: Jeremy [name], Jeremy [name]: 35 Clinic Level of Care Assessment Items TOOL 4 Quantity Score []  - 0 Use when only an EandM is performed [name] FOLLOW-UP visit ASSESSMENTS - Nursing Assessment / Reassessment X- 1 10 Reassessment of Co-morbidities (includes updates in patient status) X- 1 5 Reassessment of Adherence to [name] Plan Jeremy [name] (Jeremy [name]) [id number] Page 2 of 9 ASSESSMENTS - Wound and Skin A ssessment / Reassessment X - Simple Wound Assessment / Reassessment - one wound 1 5 []  - 0 Complex Wound Assessment / Reassessment - multiple wounds []  - 0 Dermatologic / Skin Assessment (not related to wound area) ASSESSMENTS - Focused Assessment []  - 0 Circumferential Edema Measurements - multi extremities []  - 0 Nutritional Assessment / Counseling / Intervention []  - 0 Lower Extremity Assessment (monofilament, tuning fork, pulses) []  - 0 Peripheral Arterial Disease Assessment (using hand held doppler) ASSESSMENTS - Ostomy and/or Continence Assessment and Care []  - 0 Incontinence Assessment and Management []  - 0 Ostomy Care Assessment and Management (repouching, etc.) PROCESS - Coordination of Care X - Simple Patient / Family Education for ongoing care 1 15 []  - 0 Complex (extensive) Patient / Family Education for ongoing care []  - 0 Staff obtains 01-18-1990, Records, T Results / Process Orders est []  - 0 Staff telephones HHA, Nursing Homes / Clarify orders / etc []  - 0 Routine Transfer to another Facility (non-emergent condition) []  - 0 Routine Hospital Admission (non-emergent condition) []  - 0 New Admissions / / Ordering NPWT Apligraf, etc. , []  - 0 Emergency Hospital Admission (emergent condition) X- 1 10 Simple Discharge Coordination []  - 0 Complex (extensive) Discharge Coordination PROCESS - Special Needs []  - 0 Pediatric / Minor Patient Management []  -  0 Isolation Patient Management []  - 0 Hearing / Language / Visual special needs []  - 0 Assessment of Community assistance (transportation, D/C planning, etc.) []  - 0 Additional assistance / Altered mentation []  - 0 Support Surface(s)  Assessment (bed, cushion, seat, etc.) INTERVENTIONS - Wound Cleansing / Measurement X - Simple Wound Cleansing - one wound 1 5 []  - 0 Complex Wound Cleansing - multiple wounds X- 1 5 Wound Imaging (photographs - any number of wounds) []  - 0 Wound Tracing (instead of photographs) X- 1 5 Simple Wound Measurement - one wound []  - 0 Complex Wound Measurement - multiple wounds INTERVENTIONS - Wound Dressings []  - 0 Small Wound Dressing one or multiple wounds X- 1 15 Medium Wound Dressing one or multiple wounds []  - 0 Large Wound Dressing one or multiple wounds []  - 0 Application of Medications - topical []  - 0 Application of Medications - injection INTERVENTIONS - Miscellaneous []  - 0 External ear exam Gallentine, [name] ( ) [id number] Page 3 of 9 []  - 0 Specimen Collection (cultures, biopsies, blood, body fluids, etc.) []  - 0 Specimen(s) / Culture(s) sent or taken to Lab for analysis []  - 0 Patient Transfer (multiple staff / Lift / Similar devices) []  - 0 Simple Staple / Suture removal (25 or less) []  - 0 Complex Staple / Suture removal (26 or more) []  - 0 Hypo / Hyperglycemic Management (close monitor of Blood Glucose) []  - 0 Ankle / Brachial Index (ABI) - do not check if billed separately X- 1 5 Vital Signs Has the patient been seen at the hospital within the last three years: Yes Total Score: 80 Level Of Care: New/Established - Level 3 Electronic Signature(s) Signed: 05/11/2022 5:05:44 PM By: Entered By: [name] 05/11/2022 09:37:35 -------------------------------------------------------------------------------- Encounter Discharge Information Details Patient Name:  Date of Service: Jeremy , Jeremy [name] RSHA [name] 05/11/2022 9:00 A M Medical Record Number: 01-18-1990 Patient Account Number: Date of Birth/Sex: Treating RN: 1955-06-16 (65 y.o. Michiel Sites) Primary Care Aksh Swart: Other Clinician: Referring Meryl Hubers: Treating Deoni Cosey/Extender: Weeks in [name]: 68 Encounter Discharge Information Items Discharge Condition: Stable Ambulatory Status: Ambulatory Discharge Destination: Home Transportation: Private Auto Accompanied By: self Schedule Follow-up Appointment: Yes Clinical Summary of Care: Electronic Signature(s) Signed: 05/11/2022 5:05:44 PM By: Jeremy [name] Entered By: 05/13/2022 [name] 05/11/2022 09:45:12 Lower Extremity Assessment Details -------------------------------------------------------------------------------- 05/13/2022 ([id number]) [id number] Page 4 of 9 Patient Name: Date of Service: 06/09/1956, 09-07-1990 RSHA [name] 05/11/2022 9:00 A M Medical Record Number: Jeremy [name] Patient Account Number: Jeremy [name] Date of Birth/Sex: Treating RN: 09-16-1955 (65 y.o. 31) 05/13/2022 Primary Care Elster Corbello: Jeremy [name] Other Clinician: Betha [name] Referring Gergory Biello: Treating Zurisadai Helminiak/Extender: 05/13/2022, Jeremy [name]: 35 Electronic Signature(s) Signed: 05/11/2022 4:00:23 PM By: [id number] RN Signed: 05/11/2022 5:05:44 PM By: Jeremy [name] Entered By: Jeremy [name] 05/11/2022 09:24:20 -------------------------------------------------------------------------------- Multi Wound Chart Details Patient Name: Date of Service: Jeremy [id number], Jeremy [name] RSHA [name] 05/11/2022 9:00 A M Medical Record Number: 06/09/1956 Patient Account Number: 09-07-1990 Date of Birth/Sex: Treating RN: [birth year] (65 y.o. Jeremy [name]) Jeremy [name] Primary Care Vannia Pola: Jeremy [name] Other Clinician: 05/13/2022 Referring Stephie Xu: Treating  Karver Fadden/Extender: Jeremy [name], Jeremy [name]: 35 Vital Signs Height(in): 71 Pulse(bpm): 48 Weight(lbs): 250 Blood Pressure(mmHg): 196/98 Body Mass Index(BMI): 34.9 Temperature(F): 97.4 Respiratory Rate(breaths/min): 16 [3:Photos:] [N/A:N/A] Right, Lateral Ankle N/A N/A Wound Location: Gradually Appeared N/A N/A Wounding Event: Refractory Osteomyelitis N/A N/A Primary Etiology: Diabetic Wound/Ulcer of the Lower N/A N/A Secondary Etiology: Extremity Arrhythmia, Congestive Heart Failure, N/A N/A Comorbid History: Coronary Artery Disease, Hypertension, Type II Diabetes, Osteoarthritis, Osteomyelitis 08/24/2021 N/A N/A Date Acquired: 35 N/A N/A Weeks of [name]: Open N/A  N/A Wound Status: No N/A N/A Wound Recurrence: Yes N/A N/A Pending A mputation [name] Presentation: 0.5x0.4x0.3 N/A N/A Measurements L x W x D (cm) 0.157 N/A N/A A (cm) : rea 0.047 N/A N/A Volume (cm) : -406.50% N/A N/A % Reduction in A rea: -113.60% N/A N/A % Reduction in Volume: 1 Position 1 (o'clock): 1.2 Maximum Distance 1 (cm): Yes N/A N/A Tunneling: Full Thickness With Exposed Support N/A N/A ClassificationDEONTAY, LADNIER ([id number]) [id number] Page 5 of 9 Structures Medium N/A N/A Exudate Amount: Purulent N/A N/A Exudate Type: yellow, brown, green N/A N/A Exudate Color: Medium (34-66%) N/A N/A Granulation Amount: Pink N/A N/A Granulation Quality: Small (1-33%) N/A N/A Necrotic Amount: Fat Layer (Subcutaneous Tissue): Yes N/A N/A Exposed Structures: Bone: Yes Fascia: No Tendon: No Muscle: No Joint: No Small (1-33%) N/A N/A Epithelialization: [name] Notes Electronic Signature(s) Signed: 05/11/2022 5:05:44 PM By: Jeremy [name] Entered By: Jeremy [name] 05/11/2022 09:30:52 -------------------------------------------------------------------------------- Multi-Disciplinary Care Plan Details Patient Name: Date  of Service: Jeremy Spurling, Jeremy [name] RSHA [name] 05/11/2022 9:00 A M Medical Record Number: [id number] Patient Account Number: [id number] Date of Birth/Sex: Treating RN: 1955-12-30 (65 y.o. Jeremy [name]) Jeremy [name] Primary Care Yvone Slape: Jeremy [name] Other Clinician: Betha [name] Referring Leonte Horrigan: Treating Hennessey Cantrell/Extender: Jeremy [name], Jeremy [name]: 35 Active Inactive Osteomyelitis Nursing Diagnoses: Knowledge deficit related to disease process and management Potential for infection: osteomyelitis Goals: Patient/caregiver will verbalize understanding of disease process and disease management Date Initiated: 09/07/2021 Target Resolution Date: 09/18/2021 Goal Status: Active Interventions: Assess for signs and symptoms of osteomyelitis resolution every visit Provide education [name] osteomyelitis Notes: Wound/Skin Impairment Nursing Diagnoses: Impaired tissue integrity Knowledge deficit related to smoking impact [name] wound healing Knowledge deficit related to ulceration/compromised skin integrity Goals: Patient will demonstrate a reduced rate of smoking or cessation of smoking Date Initiated: 09/07/2021 Target Resolution Date: 10/16/2021 Goal Status: Active Patient/caregiver will verbalize understanding of skin care regimen Moyd, Dashon ([id number]) [id number] Page 6 of 9 Date Initiated: 09/07/2021 Target Resolution Date: 09/18/2021 Goal Status: Active Ulcer/skin breakdown will have a volume reduction of 30% by week 4 Date Initiated: 09/07/2021 Target Resolution Date: 10/05/2021 Goal Status: Active Ulcer/skin breakdown will have a volume reduction of 50% by week 8 Date Initiated: 09/07/2021 Target Resolution Date: 11/02/2021 Goal Status: Active Ulcer/skin breakdown will have a volume reduction of 80% by week 12 Date Initiated: 09/07/2021 Target Resolution Date: 11/30/2021 Goal Status: Active Ulcer/skin breakdown will heal within 14 weeks Date  Initiated: 09/07/2021 Target Resolution Date: 12/14/2021 Goal Status: Active Interventions: Assess patient/caregiver ability to obtain necessary supplies Assess patient/caregiver ability to perform ulcer/skin care regimen upon admission and as needed Assess ulceration(s) every visit Notes: Electronic Signature(s) Signed: 05/11/2022 4:00:23 PM By: Jeremy Pax RN Signed: 05/11/2022 5:05:44 PM By: Jeremy [name] Entered By: Jeremy [name] 05/11/2022 09:30:44 -------------------------------------------------------------------------------- Pain Assessment Details Patient Name: Date of Service: Jeremy Jeremy Ohm, Jeremy [name] RSHA [name] 05/11/2022 9:00 A M Medical Record Number: [id number] Patient Account Number: [id number] Date of Birth/Sex: Treating RN: Jul 13, 1955 (65 y.o. Melonie [place] Primary Care Raksha Wolfgang: Jeremy [name] Other Clinician: Betha [name] Referring Patrik Turnbaugh: Treating Taylin Leder/Extender: Jeremy [name], Jeremy [name]: 35 Active Problems Location of Pain Severity and Description of Pain Patient Has Paino No Site Locations Pain Management and Medication Current Pain ManagementBRALLAN, DENIO ([id number]) [id number] Page 7 of 9 Electronic Signature(s) Signed: 05/11/2022 4:00:23 PM By: Jeremy Pax RN Signed: 05/11/2022 5:05:44 PM By: Jeremy [name] Entered By: Jeremy [name] 05/11/2022 09:15:35 -------------------------------------------------------------------------------- Patient/Caregiver Education Details Patient Name: Date  of Service: Jeremy SpurlingO [name], [place] RSHA [name] 11/28/2023andnbsp9:00 A M Medical Record Number: [id number] Patient Account Number: [id number] Date of Birth/Gender: Treating RN: 13-Jan-1956 (65 y.o. Jeremy PetitM) Jeremy PaxEpps, [name] Primary Care Physician: Jeremy ReichmannHande, Jeremy Other Clinician: Betha LoaVenable, Angie Referring Physician: Treating Physician/Extender: Jeremy ShinesStone, Hoyt Hande, Jeremy [name]: [id number] Education Assessment Education  Provided To: Patient Education Topics Provided Wound/Skin Impairment: Handouts: Other: continue wound care as directed Methods: Explain/Verbal Responses: State content correctly Electronic Signature(s) Signed: 05/11/2022 5:05:44 PM By: Jeremy LoaVenable, Angie Entered By: Jeremy LoaVenable, Angie [name] 05/11/2022 09:44:03 -------------------------------------------------------------------------------- Wound Assessment Details Patient Name: Date of Service: Jeremy Jeremy OhmWELL, Jeremy [name] RSHA [name] 05/11/2022 9:00 A M Medical Record Number: [id number] Patient Account Number: [id number] Date of Birth/Sex: Treating RN: 13-Jan-1956 (65 y.o. Jeremy PetitM) Jeremy PaxEpps, [name] Primary Care Makyah Lavigne: Jeremy ReichmannHande, Jeremy Other Clinician: Betha LoaVenable, Angie Referring Macsen Nuttall: Treating Jesly Hartmann/Extender: Jeremy PlumberStone, Hoyt Hande, Jeremy [name]: 35 Wound Status Wound Number: 3 Primary Refractory Osteomyelitis Etiology: Wound Location: Right, Lateral Ankle Secondary Diabetic Wound/Ulcer of the Lower Extremity Wounding Event: Gradually Appeared EtiologySarita [name]: Santagata, Vineeth ([id number]) [id number] Page 8 of 9 Etiology: Date Acquired: 08/24/2021 Wound Open Weeks Of [name]: 35 Status: Clustered Wound: No Comorbid Arrhythmia, Congestive Heart Failure, Coronary Artery Disease, Pending Amputation [name] Presentation History: Hypertension, Type II Diabetes, Osteoarthritis, Osteomyelitis Photos Wound Measurements Length: (cm) 0.5 Width: (cm) 0.4 Depth: (cm) 0.3 Area: (cm) 0.157 Volume: (cm) 0.047 % Reduction in Area: -406.5% % Reduction in Volume: -113.6% Epithelialization: Small (1-33%) Tunneling: Yes Position (o'clock): 1 Maximum Distance: (cm) 1.2 Undermining: No Wound Description Classification: Full Thickness With Exposed Suppor Exudate Amount: Medium Exudate Type: Purulent Exudate Color: yellow, brown, green t Structures Foul Odor After Cleansing: No Slough/Fibrino Yes Wound Bed Granulation Amount: Medium  (34-66%) Exposed Structure Granulation Quality: Pink Fascia Exposed: No Necrotic Amount: Small (1-33%) Fat Layer (Subcutaneous Tissue) Exposed: Yes Tendon Exposed: No Muscle Exposed: No Joint Exposed: No Bone Exposed: Yes [name] Notes Wound #3 (Ankle) Wound Laterality: Right, Lateral Cleanser Normal Saline Discharge Instruction: Wash your hands with soap and water. Remove old dressing, discard into plastic bag and place into trash. Cleanse the wound with Normal Saline prior to applying a clean dressing using gauze sponges, not tissues or cotton balls. Do not scrub or use excessive force. Pat dry using gauze sponges, not tissue or cotton balls. Peri-Wound Care Topical Primary Dressing Hydrofera Blue Classic Foam Rope Dressing, 9x6 (mm/in) Discharge Instruction: cut it a point and place into wound Secondary Dressing T Adhesive Island Dressing, 4x4 (in/in) elfa Discharge Instruction: Apply over dressing to secure in place. Secured With Compression Wrap Compression Stockings Add-Ons Electronic Signature(s) Jeremy [name], Jeremy [name] ([id number]) [id number] Page 9 of 9 Signed: 05/11/2022 4:00:23 PM By: Jeremy PaxEpps, [name] RN Signed: 05/11/2022 5:05:44 PM By: Jeremy LoaVenable, Angie Entered By: Jeremy LoaVenable, Angie [name] 05/11/2022 09:24:15 -------------------------------------------------------------------------------- Vitals Details Patient Name: Date of Service: Jeremy Jeremy OhmWELL, Jeremy [name] RSHA [name] 05/11/2022 9:00 A M Medical Record Number: [id number] Patient Account Number: [id number] Date of Birth/Sex: Treating RN: 13-Jan-1956 (65 y.o. Jeremy PetitM) Jeremy PaxEpps, [name] Primary Care Rickie Gange: Jeremy ReichmannHande, Jeremy Other Clinician: Betha LoaVenable, Angie Referring Jahsiah Carpenter: Treating Khilee Hendricksen/Extender: Jeremy PlumberStone, Hoyt Hande, Jeremy [name]: 35 Vital Signs Time Taken: 09:09 Temperature (F): 97.4 Height (in): 71 Pulse (bpm): 48 Weight (lbs): 250 Respiratory Rate (breaths/min): 16 Body Mass Index (BMI):  34.9 Blood Pressure (mmHg): 196/98 Reference Range: 80 - 120 mg / dl Notes Patient states "Ive had a slight headache for the past couple of days, but I feel fine" Purva Vessell notified of BP Electronic Signature(s) Signed: 05/11/2022 5:05:44 PM By: Jeremy LoaVenable, Angie Entered  ByBetha [name] [name] 05/11/2022 09:15:30

## 2022-05-11 NOTE — ED Provider Notes (Signed)
[facility] Provider Note    Event Date/Time   First MD Initiated Contact with Patient 05/11/22 1403     (approximate)  History   Chief Complaint: Palpitations  HPI  Jeremy [name] is a 66 y.o. male with a past medical history of CAD, diabetes, hypertension presents to the emergency department for high blood pressure and dizziness.  According to the patient over the past several weeks he has been experiencing intermittent episodes of dizziness and he believes that his blood pressure is high.  Patient states he is out of his blood pressure medication losartan/Cozaar.  Patient states he is still taking his blood thinner Xarelto for chronic A-fib/a flutter.  Denies any chest pain.  Patient during my initial evaluation states he feels better and wishes to go home.  Was asking to be discharged.  Physical Exam   Triage Vital Signs: ED Triage Vitals  Enc Vitals Group     BP 05/11/22 1129 (!) 205/103     Pulse Rate 05/11/22 1129 86     Resp 05/11/22 1129 18     Temp 05/11/22 1126 97.7 F (36.5 C)     Temp Source 05/11/22 1126 Oral     SpO2 05/11/22 1129 98 %     Weight 05/11/22 1126 240 lb (108.9 kg)     Height 05/11/22 1126 5\' 11"  (1.803 m)     Head Circumference --      Peak Flow --      Pain Score 05/11/22 1126 0     Pain Loc --      Pain Edu? --      Excl. in GC? --     Most recent vital signs: Vitals:   05/11/22 1126 05/11/22 1129  BP:  (!) 205/103  Pulse:  86  Resp:  18  Temp: 97.7 F (36.5 C)   SpO2:  98%    General: Awake, no distress.  CV:  Good peripheral perfusion.  Regular rate and rhythm  Resp:  Normal effort.  Equal breath sounds bilaterally.  Abd:  No distention.  Soft, nontender.  No rebound or guarding.   ED Results / Procedures / Treatments   EKG  EKG viewed and interpreted by myself appear to show atrial flutter 85 bpm with a narrow QRS, normal axis, normal intervals.  Atrial flutter largely unchanged from the patient's  prior EKG of January 2023.  RADIOLOGY  I have reviewed and interpreted the chest x-ray images I do not see any obvious consolidation on my evaluation. Radiology has read the x-ray is chronic interstitial changes without acute abnormality.   MEDICATIONS ORDERED IN ED: Medications - No data to display   IMPRESSION / MDM / ASSESSMENT AND PLAN / ED COURSE  I reviewed the triage vital signs and the nursing notes.  Patient's presentation is most consistent with acute presentation with potential threat to life or bodily function.  Patient presents emergency department for hypertension and dizziness.  Patient states over the last few weeks he has been intermittently dizzy, feels like it is due to his blood pressure.  States he is not currently taking any blood pressure medication.  I reviewed the patient's chart he is supposed to be taking losartan/Cozaar.  Patient confirms that he does not have this medication.  We will restart the patient on losartan.  Patient's workup in the emergency department is reassuring including CBC, chemistry and troponin.  Ideally I would like to monitor the patient in the emergency department to make  sure his blood pressure was declining however patient declines this and states he wishes to go home.  States he will follow-up with his doctor and return if anything worsens.  FINAL CLINICAL IMPRESSION(S) / ED DIAGNOSES   Hypertension  Rx / DC Orders   Losartan PCP follow-up  Note:  This document was prepared using Dragon voice recognition software and may include unintentional dictation errors.   [name], MD 05/11/22 1429

## 2022-05-11 NOTE — ED Triage Notes (Signed)
Pt in palpitations and dizziness since this am. Pt states he has a hx of htn and bp was in the 200's systolic today. Pt has been taking his bp meds, last one was last night.

## 2022-05-25 ENCOUNTER — Encounter: Payer: Medicare Other | Attending: Physician Assistant | Admitting: Physician Assistant

## 2022-05-25 DIAGNOSIS — L987 Excessive and redundant skin and subcutaneous tissue: Secondary | ICD-10-CM | POA: Insufficient documentation

## 2022-05-25 DIAGNOSIS — L97316 Non-pressure chronic ulcer of right ankle with bone involvement without evidence of necrosis: Secondary | ICD-10-CM | POA: Insufficient documentation

## 2022-05-25 DIAGNOSIS — I2510 Atherosclerotic heart disease of native coronary artery without angina pectoris: Secondary | ICD-10-CM | POA: Diagnosis not present

## 2022-05-25 DIAGNOSIS — E11621 Type 2 diabetes mellitus with foot ulcer: Secondary | ICD-10-CM | POA: Insufficient documentation

## 2022-05-25 DIAGNOSIS — Z8619 Personal history of other infectious and parasitic diseases: Secondary | ICD-10-CM | POA: Insufficient documentation

## 2022-05-25 DIAGNOSIS — E1169 Type 2 diabetes mellitus with other specified complication: Secondary | ICD-10-CM | POA: Insufficient documentation

## 2022-05-25 DIAGNOSIS — M86671 Other chronic osteomyelitis, right ankle and foot: Secondary | ICD-10-CM | POA: Diagnosis not present

## 2022-05-25 DIAGNOSIS — I10 Essential (primary) hypertension: Secondary | ICD-10-CM | POA: Insufficient documentation

## 2022-05-25 NOTE — Progress Notes (Addendum)
Jeremy, [name] ([id number]) [id number] Page 1 of 7 Visit Report for 05/25/2022 Arrival Information Details Patient Name: Date of Service: [place], [place] 05/25/2022 9:45 A M Medical Record Number: [id number] Patient Account Number: [id number] Date of Birth/Sex: Treating RN: [birth year] (66 y.o. Jeremy [name]) Carlene Coria Primary Care Courtnee Myer: Tracie Harrier Other Clinician: Massie Kluver Referring Chase Arnall: Treating Leidi Astle/Extender: Jeri Cos Self, Referral Weeks in Treatment: 44 Visit Information History Since Last Visit All ordered tests and consults were completed: No Patient Arrived: Ambulatory Added or deleted any medications: No Arrival Time: 10:18 Any new allergies or adverse reactions: No Transfer Assistance: None Had a fall or experienced change in No Patient Identification Verified: Yes activities of daily living that may affect Secondary Verification Process Completed: Yes risk of falls: Patient Requires Transmission-Based Precautions: No Signs or symptoms of abuse/neglect since last visito No Patient Has Alerts: Yes Hospitalized since last visit: No Patient Alerts: Patient on Blood Thinner Implantable device outside of the clinic excluding No Eliquis cellular tissue based products placed in the center since last visit: Has Dressing in Place as Prescribed: Yes Pain Present Now: No Electronic Signature(s) Signed: 06/01/2022 4:45:28 PM By: Massie Kluver Entered By: Massie Kluver on 05/25/2022 10:21:07 -------------------------------------------------------------------------------- Clinic Level of Care Assessment Details Patient Name: Date of Service: Jeremy [name], [place] 05/25/2022 9:45 A M Medical Record Number: [id number] Patient Account Number: [id number] Date of Birth/Sex: Treating RN: [birth year] (66 y.o. Jeremy [name]) Carlene Coria Primary Care Carlethia Mesquita: Tracie Harrier Other Clinician: Massie Kluver Referring Syenna Nazir: Treating  Lenny Bouchillon/Extender: Jeri Cos Self, Referral Weeks in Treatment: 37 Clinic Level of Care Assessment Items TOOL 4 Quantity Score []  - 0 Use when only an EandM is performed on FOLLOW-UP visit ASSESSMENTS - Nursing Assessment / Reassessment X- 1 10 Reassessment of Co-morbidities (includes updates in patient status) X- 1 5 Reassessment of Adherence to Treatment Plan ASSESSMENTS - Wound and Skin A ssessment / Reassessment X - Simple Wound Assessment / Reassessment - one wound 1 5 []  - 0 Complex Wound Assessment / Reassessment - multiple wounds []  - 0 Dermatologic / Skin Assessment (not related to wound area) ASSESSMENTS - Focused Assessment []  - 0 Circumferential Edema Measurements - multi extremities []  - 0 Nutritional Assessment / Counseling / Intervention []  - 0 Lower Extremity Assessment (monofilament, tuning fork, pulses) []  - 0 Peripheral Arterial Disease Assessment (using hand held doppler) ASSESSMENTS - Ostomy and/or Continence Assessment and Care []  - 0 Incontinence Assessment and Management []  - 0 Ostomy Care Assessment and Management (repouching, etc.) PROCESS - Coordination of Care [place], Ruthann Cancer ([id number] Page 2 of 7 X- 1 15 Simple Patient / Family Education for ongoing care []  - 0 Complex (extensive) Patient / Family Education for ongoing care []  - 0 Staff obtains Programmer, systems, Records, T Results / Process Orders est []  - 0 Staff telephones HHA, Nursing Homes / Clarify orders / etc []  - 0 Routine Transfer to another Facility (non-emergent condition) []  - 0 Routine Hospital Admission (non-emergent condition) []  - 0 New Admissions / Biomedical engineer / Ordering NPWT Apligraf, etc. , []  - 0 Emergency Hospital Admission (emergent condition) X- 1 10 Simple Discharge Coordination []  - 0 Complex (extensive) Discharge Coordination PROCESS - Special Needs []  - 0 Pediatric / Minor Patient Management []  -  0 Isolation Patient Management []  - 0 Hearing / Language / Visual special needs []  - 0 Assessment of Community assistance (transportation, D/C planning, etc.) []  - 0 Additional assistance / Altered mentation []  - 0 Support Surface(s) Assessment (  bed, cushion, seat, etc.) INTERVENTIONS - Wound Cleansing / Measurement X - Simple Wound Cleansing - one wound 1 5 []  - 0 Complex Wound Cleansing - multiple wounds X- 1 5 Wound Imaging (photographs - any number of wounds) []  - 0 Wound Tracing (instead of photographs) X- 1 5 Simple Wound Measurement - one wound []  - 0 Complex Wound Measurement - multiple wounds INTERVENTIONS - Wound Dressings []  - 0 Small Wound Dressing one or multiple wounds X- 1 15 Medium Wound Dressing one or multiple wounds []  - 0 Large Wound Dressing one or multiple wounds []  - 0 Application of Medications - topical []  - 0 Application of Medications - injection INTERVENTIONS - Miscellaneous []  - 0 External ear exam []  - 0 Specimen Collection (cultures, biopsies, blood, body fluids, etc.) []  - 0 Specimen(s) / Culture(s) sent or taken to Lab for analysis []  - 0 Patient Transfer (multiple staff / Civil Service fast streamer / Similar devices) []  - 0 Simple Staple / Suture removal (25 or less) []  - 0 Complex Staple / Suture removal (26 or more) []  - 0 Hypo / Hyperglycemic Management (close monitor of Blood Glucose) []  - 0 Ankle / Brachial Index (ABI) - do not check if billed separately X- 1 5 Vital Signs Has the patient been seen at the hospital within the last three years: Yes Total Score: 80 Level Of Care: New/Established - Level 3 Electronic Signature(s) Signed: 06/01/2022 4:45:28 PM By: Leonia Corona, Ruthann Cancer ([id number] Page 3 of 7 Entered By: Massie Kluver on 05/25/2022 10:45:25 -------------------------------------------------------------------------------- Encounter Discharge Information Details Patient Name:  Date of Service: Jeremy [name], [place] 05/25/2022 9:45 A M Medical Record Number: [id number] Patient Account Number: [id number] Date of Birth/Sex: Treating RN: 12/21/1955 (66 y.o. Jeremy [name]) Carlene Coria Primary Care Adelfo Diebel: Tracie Harrier Other Clinician: Massie Kluver Referring Indria Bishara: Treating Vittoria Noreen/Extender: Jeri Cos Self, Referral Weeks in Treatment: 46 Encounter Discharge Information Items Discharge Condition: Stable Ambulatory Status: Ambulatory Discharge Destination: Home Transportation: Private Auto Accompanied By: self Schedule Follow-up Appointment: Yes Clinical Summary of Care: Electronic Signature(s) Signed: 06/01/2022 4:45:28 PM By: Massie Kluver Entered By: Massie Kluver on 05/25/2022 14:12:36 -------------------------------------------------------------------------------- Lower Extremity Assessment Details Patient Name: Date of Service: Jeremy [name], [place] R[place] LL 05/25/2022 9:45 A M Medical Record Number: [id number] Patient Account Number: [id number] Date of Birth/Sex: Treating RN: [birth year] (65 y.o. Jeremy [name]) Carlene Coria Primary Care Anglia Blakley: Tracie Harrier Other Clinician: Massie Kluver Referring Corda Shutt: Treating Negar Sieler/Extender: Jeri Cos Self, Referral Weeks in Treatment: 37 Electronic Signature(s) Signed: 05/25/2022 4:34:43 PM By: Carlene Coria RN Signed: 06/01/2022 4:45:28 PM By: Massie Kluver Entered By: Massie Kluver on 05/25/2022 10:35:32 -------------------------------------------------------------------------------- Multi Wound Chart Details Patient Name: Date of Service: [place] Georgeanna Harrison, [place] RSHA LL 05/25/2022 9:45 A M Medical Record Number: [id number] Patient Account Number: [id number] Date of Birth/Sex: Treating RN: Jun 25, 1955 (65 y.o. 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/A N/A Secondary Etiology: Extremity Arrhythmia, Congestive Heart Failure, N/A N/A Comorbid History: Coronary Artery Disease, Hypertension, Type II Diabetes, Osteoarthritis, Osteomyelitis 08/24/2021 N/A N/A Date Acquired: 36 N/A N/A Weeks of Treatment: Open N/A N/A Wound Status: No N/A N/A Wound Recurrence: Yes  N/A N/A Pending A mputation on Presentation: 0.3x0.3x1.1 N/A N/A Measurements L x W x D (cm) 0.071 N/A N/A A (cm) : rea 0.078 N/A N/A Volume (cm) : -129.00% N/A N/A % Reduction in A rea: -254.50% N/A N/A % Reduction in Volume: Full Thickness With Exposed Support N/A N/A Classification: Structures Medium N/A N/A Exudate Amount: Purulent N/A N/A Exudate Type: yellow, brown, green N/A N/A Exudate Color: Medium (34-66%) N/A N/A Granulation Amount: Pink N/A N/A Granulation Quality: Small (1-33%) N/A N/A Necrotic Amount: Fat Layer (Subcutaneous Tissue): Yes N/A N/A Exposed Structures: Bone: Yes Fascia: No Tendon: No Muscle: No Joint: No Small (1-33%) N/A N/A Epithelialization: Treatment Notes Electronic Signature(s) Signed: 06/01/2022 4:45:28 PM By: Massie Kluver Entered By: Massie Kluver on 05/25/2022 10:35:36 -------------------------------------------------------------------------------- [place] Details Patient Name: Date of Service: Jeremy [place], [place] RSHA LL 05/25/2022 9:45 A M Medical Record Number: [id number] Patient Account Number: [id number] Date of Birth/Sex: Treating RN: 10-31-1955 (65 y.o. Jeremy [name]) Carlene Coria Primary Care Cyrene Gharibian: Tracie Harrier Other  Clinician: Massie Kluver Referring Jerlisa Diliberto: Treating Domnick Chervenak/Extender: Jeri Cos Self, Referral Weeks in Treatment: 57 Active Inactive Electronic Signature(s) Signed: 07/14/2022 7:57:54 PM By: Gretta Cool, BSN, RN, CWS, Kim RN, BSN Signed: 09/07/2022 8:45:10 AM By: Carlene Coria RN Previous Signature: 05/25/2022 4:34:43 PM Version By: Carlene Coria RN Previous Signature: 06/01/2022 4:45:28 PM Version By: Massie Kluver Entered By: Gretta Cool BSN, RN, CWS, Kim on 07/14/2022 19:57:54 -------------------------------------------------------------------------------- Pain Assessment Details Patient Name: Date of Service: Jeremy [place], [place] RSHA LL 05/25/2022 9:45 A M Medical Record Number: [id number] Patient Account Number: [id number] Date of Birth/Sex: Treating RN: Aug 25, 1955 (65 y.o. Oval Linsey Primary Care Osceola Depaz: Tracie Harrier Other Clinician: Massie Kluver Referring Lynnetta Tom: Treating Elizebeth Kluesner/Extender: Jeri Cos Self, Referral Weeks in Treatment: [address] [place] ([id number] Page 5 of 7 Active Problems Location of Pain Severity and Description of Pain Patient Has Paino No Site Locations Pain Management and Medication Current Pain Management: Electronic Signature(s) Signed: 05/25/2022 4:34:43 PM By: Carlene Coria RN Signed: 06/01/2022 4:45:28 PM By: Massie Kluver Entered By: Massie Kluver on 05/25/2022 10:28:49 -------------------------------------------------------------------------------- Patient/Caregiver Education Details Patient Name: Date of Service: Jeremy Sl[place], [place] RSHA LL 12/12/2023andnbsp9:45 [place] Record Number: [id number] Patient Account Number: [id number] Date of Birth/Gender: Treating RN: 06-03-1956 (65 y.o. Jeremy [name]) Carlene Coria Primary Care Physician: Tracie Harrier Other Clinician: Massie Kluver Referring Physician: Treating Physician/Extender: Jeri Cos Self, Referral Weeks in Treatment: 80 Education  Assessment Education Provided To: Patient Education Topics Provided Wound/Skin Impairment: Handouts: Other: continue wound care as diected Methods: Explain/Verbal Responses: State content correctly Electronic Signature(s) Signed: 06/01/2022 4:45:28 PM By: Massie Kluver Entered By: Massie Kluver on 05/25/2022 10:45:53 -------------------------------------------------------------------------------- Wound Assessment Details Patient Name: Date of Service: Jeremy [place], [place] RSHA LL 05/25/2022 9:45 A M Medical Record Number: [id number] Patient Account Number: [id number] Date of Birth/Sex: Treating RN: November 18, 1955 (65 y.o. 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 Wounding Event: Gradually Appeared Etiology: Date Acquired: 08/24/2021 Wound Open Weeks Of Treatment: 37 Status: Clustered Wound: No Comorbid Arrhythmia, Congestive Heart Failure, Coronary Artery Disease, Pending Amputation On Presentation History: Hypertension, Type II Diabetes, Osteoarthritis, Osteomyelitis Photos Wound Measurements Length: (cm) 0.3 Width: (cm) 0.3 Depth: (cm) 1.1 Area: (cm) 0.071 Volume: (cm) 0.078 % Reduction in Area: -  129% % Reduction in Volume: -254.5% Epithelialization: Small (1-33%) Tunneling: No Undermining: No Wound Description Classification: Full Thickness With Exposed Suppor Exudate Amount: Medium Exudate Type: Purulent Exudate Color: yellow, brown, green t Structures Foul Odor After Cleansing: No Slough/Fibrino Yes Wound Bed Granulation Amount: Medium (34-66%) Exposed Structure Granulation Quality:  Pink Fascia Exposed: No Necrotic Amount: Small (1-33%) Fat Layer (Subcutaneous Tissue) Exposed: Yes Tendon Exposed: No Muscle Exposed: No Joint Exposed: No Bone Exposed: Yes Electronic Signature(s) Signed: 05/25/2022 4:34:43 PM By: Carlene Coria RN Signed: 06/01/2022 4:45:28 PM By: Massie Kluver Entered By: Massie Kluver on 05/25/2022 10:35:19 -------------------------------------------------------------------------------- Vitals Details Patient Name: Date of Service: RO Georgeanna Harrison, [place] RSHA LL 05/25/2022 9:45 A M Medical Record Number: [id number] Patient Account Number: [id number] Date of Birth/Sex: Treating RN: 08-04-1955 (65 y.o. Jeremy [name]) Carlene Coria Primary Care Jamier Urbas: Tracie Harrier Other Clinician: Massie Kluver Referring Yomara Toothman: Treating Lutie Pickler/Extender: Jeri Cos Self, Referral Weeks in Treatment: 37 Vital Signs Time Taken: 10:22 Temperature (F): 97.6 Height (in): 71 Pulse (bpm): 85 Weight (lbs): 250 Respiratory Rate (breaths/min): 18 Body Mass Index (BMI): 34.9 Blood Pressure (mmHg): 163/79 Reference Range: 80 - 120 mg / dl Electronic Signature(s) Shackleton, Christy ([id number] Page 7 of 7 Signed: 06/01/2022 4:45:28 PM By: Massie Kluver Entered By: Massie Kluver on 05/25/2022 [id number]

## 2022-05-25 NOTE — Progress Notes (Addendum)
Jeremy [name] ([id number]) [id number] Page 1 of 9 Visit Report for 05/25/2022 Chief Complaint Document Details Patient Name: Date of Service: Jeremy [name], [name] 05/25/2022 9:45 A M Medical Record Number: [id number] Patient Account Number: [id number] Date of Birth/Sex: Treating RN: 02-20-1956 (65 y.o. [name]) Yevonne Pax Primary Care Provider: Barbette Reichmann Other Clinician: [name] Referring Provider: Treating Provider/Extender: Jearld Shines Weeks in Treatment: 37 Information Obtained from: Patient Chief Complaint Right ankle ulcer Electronic Signature(s) Signed: 05/25/2022 9:02:23 AM By: Lenda Kelp PA-C Entered By: Lenda Kelp on 05/25/2022 09:02:23 -------------------------------------------------------------------------------- HPI Details Patient Name: Date of Service: Jeremy Thayer Ohm, MA RSHA LL 05/25/2022 9:45 A M Medical Record Number: [id number] Patient Account Number: [id number] Date of Birth/Sex: Treating RN: 09/18/1955 (65 y.o. [name]) Yevonne Pax Primary Care Provider: Barbette Reichmann Other Clinician: [name] Referring Provider: Treating Provider/Extender: Jearld Shines Weeks in Treatment: 37 History of Present Illness HPI Description: 08/18/2020 upon evaluation today patient presents for initial inspection here in our clinic concerning issues has been having with his right lateral ankle and this has been present for at least a year he tells me. He is a patient of Dr. Orland Jarred. Subsequently Dr. Orland Jarred has since retired hence the patient look this up and is coming here for wound care to see if we can help him out. He did have a screw pushed out of this location and since that time tells me that the screw was removed but nonetheless the hole has remained. Fortunately there does not appear to be any signs of active infection systemically at this point though it does raise the question as to whether  or not this might be a bone/hardware infection being that this has been open for so long. The patient does have a history of diabetes mellitus type 2, coronary artery disease, hypertension, and unfortunately does not seem to be doing as great as I would like to see him regard to his left ankle. 08/25/2020 upon evaluation today patient appears to be doing well with regard to his ankle ulcer compared to last week this is definitely smaller. With that being said it still does probe down to bone/hardware. I still think this can be appropriate for him to see the orthopedic specialist. He has not heard from them yet he tells me. 09/05/2020 upon evaluation today patient appears to be doing extremely well in regard to his wound all things considered. I do not see that this is gotten any worse. Unfortunately also do not think he is gotten any better. We did make a referral to orthopedics to Dr. Victorino Dike specifically but the patient tells me that he has not heard from them. That is unusual as they normally get in touch with patients fairly quickly. I will ask him if he possibly missed a phone call he tells me he will check when he gets home. Nonetheless he does not want to go to [facility] anyway which he did not tell me previously therefore we will get a see about making a referral to Dr. Logan Bores to see what he potentially could recommend for the patient as well I think Dr. Logan Bores is excellent and a good option here. [name], [name] ([id number]) [id number] Page 2 of 9 Readmission: 10/28/2020 upon evaluation today patient appears to be doing about the same as when I last saw him. He did not come back for follow-up last time I saw him was 09/05/2020. Basically he tells me that he has been attempting to manage this on  his own using the Hydrofera Blue rope. He did get a call from [facility] but they did not actually get him scheduled due to the fact that the patient told me that he told  them he did not wanted to see them he just wanted to come back and see me because he was happy with my care. Nonetheless as I explained to the patient he has a fractured screw at the site of the wound he also has another screw where there appears to be some loosening of the screw and potential for hardware/bone infection here. Subsequently I think he really does need to see a specialist to see what they can do to help him out I do not believe him to be able to get this healed without taking care of the hardware issues. 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 He has been advised previous of a need for an above-knee amputation due to some of the aggressive breakdown in the ankle region. Nonetheless he has had the removal of the screw performed in office with Dr. Excell Seltzer and this was on 08/28/2021. Subsequently the patient is a poor historian he also gets upset very easily. Of note I do believe that he is going to require some dressings to be packed into the area due to the fact that to be honest this is still tracking all the way straight down to bone where there was pus and purulence coming out once I did get down to that area. Subsequently he tells me that he is having pain although it does seem to be doing better since the screw was removed. 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 Therefore his best options probably do entail the continue following up by Dr. Sampson Goon for infectious disease coupled with wound care as best we can and try to see if we keep this clean as much as possible and keep it from becoming more infected. At some point there may be a time where this could continually get worse and may end up not doing nearly as well but right now he  does not seem to be doing too poorly and I think this is the best way to support him. I am not overly confident that this is ever getting heal however. 09/15/21 upon evaluation today patient appears to be doing well with regard to his wound. He has been tolerating the dressing changes without complication. Fortunately I do not see any signs of active infection at this time. Overall I think that he is doing quite well. 09-22-2021 upon evaluation today patient appears to be doing about the same in regard to his ankle. 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 He again has been told that there is probably not a likely scenario in which this is able to heal. Nonetheless he is not a HBO candidate due to low ejection fraction. He does see Dr. Sampson Goon he still taking antibiotics at this point we will try to keep the area open so does not develop an abscess. 09-29-2021 upon evaluation today patient appears to be doing well with regard to his wound all things considered. He continues to use the [employer]  rope. This is still a fairly deep wound that I think the rope is doing well to help keep the drainage from collecting in the base of the wound there does appear to be less drainage definitely not as purulent as what we have noticed in the past. There is no increased pain and no signs of worsening in general. 10-06-2021 upon evaluation today patient's wound is continuing to show signs of issues here at this point. Fortunately I do not see any evidence of active infection locally or systemically which is great news but at the same time he is definitely having some ongoing problems here currently. 5/8; 2-week follow-up. Right medial ankle. Small wound purulent drainage. He is on doxycycline chronically for underlying osteomyelitis. He has hardware in this area. He is using Hydrofera Blue packing strips 11-02-2021 upon  evaluation today patient appears to be doing much better in regard to his wound I am very pleased to see this. I do not see any evidence of active infection locally nor systemically which is great news. No fevers, chills, nausea, vomiting, or diarrhea. 11-16-2021 upon evaluation today patient appears to be doing well with regard to his ankle ulcer this is a very tiny area remaining at this point. Fortunately I do not see any evidence of infection locally or systemically at this time. Overall I think we are on the right track. 11-30-2021 Upon inspection patient's wound bed actually showed signs of having just a very small opening that still between 1 to 2 mm max. The back end of the sterile Q-tip stick I was able to get down in here and it does still probe down to bone. With that being said he still has drainage as well. Nonetheless he has not wanted any further surgical intervention and so mainly is just more of something were doing to try to manage this as best we can do only thing really were doing a split Hydrofera Blue externally has a lot of new skin covering over where previously had a lot of open area but at the same time I am still concerned about the fact this probes all the way down to bone. 12-14-2021 upon evaluation today patient appears to be doing about the same in regard to his wound. This still actually probes down to bone. He has not seen ID anytime recently. Has been off of the doxycycline for quite some time. For that reason I really feel like we may need to see about getting something done a little bit more specific for him here and when to try to obtain a culture today so that we can see what that shows and maybe get him on antibiotics that could help this to heal faster. He is in agreement with that plan. 12-28-2021 upon evaluation today patient appears to be doing worse in regard to his wound. Unfortunately this has sealed up and does appear to be trapping quite a bit of fluid  underneath the skin the skin is loose and honestly needs to be trimmed away I discussed that with the patient today. He voiced understanding and did consent to the debridement. This is good to be done with scissors and forceps. 01-11-2022 upon evaluation today patient's wound actually is showing signs of being a little better than last time I saw him. I do believe that the doxycycline has been effective in calming this down to some degree. Fortunately I do not see any evidence of active infection at this time systemically although locally he is actually having  some signs of cellulitis though this is much better compared to 2 weeks ago when I last saw him. 01-25-2022 upon evaluation today patient appears to be doing well with regard to his foot ulcer all things considered. He still has a wound that goes deeper I think switching back to the [facility] rope is probably can to be better for him at this point. 02-08-2022 upon evaluation today patient appears to be doing well currently in regard to his wound all things considered. This is still chronic osteomyelitis issue at least from the previous evaluations. He has previously been on antibiotics for an extended period of time. Right now I have a Mannam which I started back on July 17 and that was for 2 refills he is still taking that. There was an initial 30-day supply. That should actually carry him from July 17 to October 17. With that being said I am really not sure that we are seeing a whole lot of improvement we seem to have some waxing and waning but would not ever really completely seeing this healed unfortunately. I do think the Hydrofera Blue rope is the best way to go however this still is going all the way down to bone and that has me concerned. I discussed this with the patient before but he really does not want to take any further surgical intervention he mainly is just wanting to try let this heal its own way and he is happy with the way  things are going at the moment. 02-23-2022 upon evaluation today patient appears to be doing well currently in regard to his wound all things considered. He still taking the doxycycline which I do have him on long-term in order to keep this under control. He was post to be on it by infectious disease but when that ran out I continued this as he has continued and ongoing chronic osteomyelitis of the ankle. He has been recommended amputation but he does not want to proceed with that. He also is not a HBO candidate at this point. He also tells me that he really feels like it is "doing better even though the size everything is really about the same there is no significant improvement in general here. 03-09-2022 upon evaluation today patient appears to be doing decently well in regard to his wound. This is still probing down to bone however. Again we discussed treatment options which have always included amputation and IV antibiotics but which the patient has declined in the past. Subsequently we have had him on oral antibiotics for some time and this does seem to help to a degree. With that being said I do not see any evidence of infection locally or systemically right now although he does still have some drainage this is not doing nearly as poorly as what it had been in the past. With that being said I do think that doing an updated x-ray would not be a bad idea based on where we stand currently as well. That was discussed with the patient today. 03-23-2022 upon evaluation today patient's wound actually is showing signs of doing about the same in regard to the ankle region. Fortunately there does not appear to be any signs of active infection locally nor systemically at this time which is great news. With that being said I did review his x-rays and in fact CALIPH, BOROWIAK ([id number]) [id number] Page 3 of 9 reviewed the actual images as well. What I found was that I am concerned  about  the hardware loosening and to be honest I think that this is something that he probably needs to have either revision or removal of the hardware in general in order to try to allow this wound to heal with the hardware there and I feel like going to continue to have issues to be perfectly honest. I discussed that with him today and I would like for him to see Dr. Sanjuana Letters who is a local podiatrist whom I trust to see what he suggest about any possibility of infection from the hardware and whether or not the hardware loosening could be potentially a cause here and something that could be fixed with intervention. 04-06-2022 upon evaluation today patient unfortunately has continued to have this waxing and waning as far as the wound size today it had somewhat scabbed over and there was a lot of fluid underneath and when this open the wound is again larger today compared to what it was last time I saw him. Again I do believe this likely represents an infected hardware/potential osteomyelitis type issue and I discussed that with him today. He wanted to see his x-rays which I did show him today in order to help him understand what we were dealing with and how things were internally. With that being said I explained that I am not a surgeon nor a radiologist. Nonetheless I have looked at a fair number of x-rays and I do believe that the potential for the fractured screw that is remaining and there may be a site of infection or to be honest any of the other hardware. Therefore I did want him to see a surgeon to discuss options and that is why I did send him to Dr. Sanjuana Letters who he is supposed to be seeing on Friday. 11/7; small wound but easily probes to bone. He has known chronic osteomyelitis and/or hardware infection in this area. He is on antibiotics. I am not certain about orthopedic follow-up.Scant drainage. 04-29-2022 upon evaluation today patient appears to be doing about the same in regard to  his wound. He apparently did finally see Dr. Logan Bores although I have not had a chance to review that note as of yet. Fortunately there does not appear to be any signs of infection at this time which is great news. No fevers, chills, nausea, vomiting, or diarrhea. 05-11-2022 upon evaluation today patient appears to be doing well currently in regard to his wound although this is still stable its about the same. Fortunately there does not appear to be any signs of infection at this time which is great news. No fevers, chills, nausea, vomiting, or diarrhea. 05-11-2022 upon evaluation today patient is pretty much the same as far as his wounds are concerned. I do not see any evidence of infection or worsening overall and in general I think that he is really stalled I do believe he needs to proceed with the surgery with Dr. Logan Bores I think best the only way that he is in to be able to get this to heal as I am firmly convinced that he does have chronic osteomyelitis as well as hardware infection and get the hardware removed this probably can be the only way that he is going to see this closed. I discussed this with him multiple times in the past he has been resistant to proceeding with the surgery however. 05-25-2022 upon evaluation patient's wound really is doing about the same. I do not see that this is worse also do not see that  it is better. Again I discussed with him the possibility of going forward with the surgery with Dr. Logan Bores. He still seems very much on the fence as far as that is concerned. I am not sure which decision to make he tells me that he is going to have the surgery but "just not right now". Electronic Signature(s) Signed: 05/25/2022 11:03:10 AM By: Lenda Kelp PA-C Entered By: Lenda Kelp on 05/25/2022 11:03:10 -------------------------------------------------------------------------------- Physical Exam Details Patient Name: Date of Service: Jeremy [name], [place] 05/25/2022 9:45 A  M Medical Record Number: [id number] Patient Account Number: [id number] Date of Birth/Sex: Treating RN: 1955/06/21 (65 y.o. [name]) Yevonne Pax Primary Care Provider: Barbette Reichmann Other Clinician: [name] Referring Provider: Treating Provider/Extender: Karle Plumber, Vishwanath Weeks in Treatment: 37 Constitutional Well-nourished and well-hydrated in no acute distress. Respiratory normal breathing without difficulty. Psychiatric this patient is able to make decisions and demonstrates good insight into disease process. Alert and Oriented x 3. pleasant and cooperative. Notes Upon inspection patient's wound bed actually showed signs of good granulation epithelization at this point. Fortunately there does not appear to be any signs of infection systemically though locally there is definitely infection that we have noted. Again he does have some fractured screws and again some of the areas that were pink if shifted my concern is always been obviously infected hardware which I think is still the biggest concern here. Electronic Signature(s) Signed: 05/25/2022 11:03:40 AM By: Lenda Kelp PA-C Entered By: Lenda Kelp on 05/25/2022 11:03:39 Jeremy [name] ([id number]) [id number] Page 4 of 9 -------------------------------------------------------------------------------- Physician Orders Details Patient Name: Date of Service: Jeremy [name], [place] 05/25/2022 9:45 A M Medical Record Number: [id number] Patient Account Number: [id number] Date of Birth/Sex: Treating RN: 05/31/1956 (65 y.o. 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 Bathing/ Shower/ Hygiene May shower; gently cleanse wound with antibacterial soap, rinse and pat dry prior to dressing wounds No tub bath. Edema Control - Lymphedema / Segmental Compressive Device / Other Elevate leg(s) parallel to the floor when sitting. DO YOUR BEST to sleep in the bed at night. DO NOT sleep in your recliner. Long hours of sitting in a recliner leads to swelling of the legs and/or potential wounds on your backside. Additional Orders / Instructions Follow Nutritious Diet and Increase Protein Intake - monitor blood glucose to maintain normal level Medications-Please add to medication list. ntibiotics - continue Doxycycline P.O. A Wound Treatment Wound #3 - Ankle Wound Laterality: Right, Lateral Cleanser: Normal Saline 1 x Per Day/30 Days Discharge Instructions: Wash your hands with soap and water. Remove old dressing, discard into plastic bag and place into trash. Cleanse the wound with Normal Saline prior to applying a clean dressing using gauze sponges, not tissues or cotton balls. Do not scrub or use excessive force. Pat dry using gauze sponges, not tissue or cotton balls. Prim Dressing: Hydrofera Blue Classic Foam Rope Dressing, 9x6 (mm/in) ary 1 x Per Day/30 Days Discharge Instructions: cut it a point and place  into wound Secondary Dressing: T Adhesive [employer], 4x4 (in/in) 1 x Per Day/30 Days elfa Discharge Instructions: Apply over dressing to secure in place. Electronic Signature(s) Signed: 05/25/2022 5:45:58 PM By: Lenda Kelp PA-C Signed: 06/01/2022 4:45:28 PM By: [name] Entered By: [name] on 05/25/2022 10:44:58 Jeremy [name] ([id number]) [id number] Page 5 of 9 -------------------------------------------------------------------------------- Problem List Details Patient Name: Date of Service: Jeremy [name], [place] 05/25/2022 9:45 A M Medical Record Number: [id number] Patient Account Number: [id number] Date of Birth/Sex: Treating RN: 08/29/1955 (65 y.o. 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 without 09/07/2021 No Yes evidence of necrosis E11.622 Type 2 diabetes mellitus with other skin ulcer 09/07/2021 No Yes I10 Essential (primary) hypertension 09/07/2021 No Yes I25.10 Atherosclerotic heart disease of native coronary artery without angina pectoris 09/07/2021 No Yes Inactive Problems Resolved Problems Electronic Signature(s) Signed: 05/25/2022 9:02:18 AM By: Lenda Kelp PA-C Entered By: Lenda Kelp on 05/25/2022 09:02:18 -------------------------------------------------------------------------------- Progress Note Details Patient Name: Date of Service: Jeremy WELL, [place] 05/25/2022 9:45 A M Medical Record Number: [id number] Patient Account Number: [id number] Date of Birth/Sex: Treating RN: February 19, 1956 ([address] y.o. Melonie [place], [place] ([id number]) [phone].pdf Page 6 of 9 Primary Care Provider: Barbette Reichmann Other Clinician: [name] Referring Provider: Treating Provider/Extender: Jearld Shines Weeks in Treatment: 37 Subjective Chief Complaint Information obtained from Patient Right ankle ulcer History of Present  Illness (HPI) 08/18/2020 upon evaluation today patient presents for initial inspection here in our clinic concerning issues has been having with his right lateral ankle and this has been present for at least a year he tells me. He is a patient of Dr. Orland Jarred. Subsequently Dr. Orland Jarred has since retired hence the patient look this up and is coming here for wound care to see if we can help him out. He did have a screw pushed out of this location and since that time tells me that the screw was removed but nonetheless the hole has remained. Fortunately there does not appear to be any signs of active infection systemically at this point though it does raise the question as to whether or not this might be a bone/hardware infection being that this has been open for so long. The patient does have a history of diabetes mellitus type 2, coronary artery disease, hypertension, and unfortunately does not seem to be doing as great as I would like to see him regard to his left ankle. 08/25/2020 upon evaluation today patient appears to be doing well with regard to his ankle ulcer compared to last week this is definitely smaller. With that being said it still does probe down to bone/hardware. I still think this can be appropriate for him to see the orthopedic specialist. He has not heard from them yet he tells me. 09/05/2020 upon evaluation today patient appears to be doing extremely well in regard to his wound all things considered. I do not see that this is gotten any worse. Unfortunately also do not think he is gotten any better. We did make a referral to orthopedics to Dr. Victorino Dike specifically but the patient tells me that he has not heard from them. That is unusual as they normally get in  touch with patients fairly quickly. I will ask him if he possibly missed a phone call he tells me he will check when he gets home. Nonetheless he does not want to go to [facility] anyway which he did not tell me previously therefore we  will get a see about making a referral to Dr. Logan Bores to see what he potentially could recommend for the patient as well I think Dr. Logan Bores is excellent and a good option here. Readmission: 10/28/2020 upon evaluation today patient appears to be doing about the same as when I last saw him. He did not come back for follow-up last time I saw him was 09/05/2020. Basically he tells me that he has been attempting to manage this on his own using the Hydrofera Blue rope. He did get a call from [facility] but they did not actually get him scheduled due to the fact that the patient told me that he told them he did not wanted to see them he just wanted to come back and see me because he was happy with my care. Nonetheless as I explained to the patient he has a fractured screw at the site of the wound he also has another screw where there appears to be some loosening of the screw and potential for hardware/bone infection here. Subsequently I think he really does need to see a specialist to see what they can do to help him out I do not believe him to be able to get this healed without taking care of the hardware issues. 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 He has been advised previous of a need for an above-knee amputation due to some of the aggressive breakdown in the ankle region. Nonetheless he has had the removal of the screw performed in office with Dr. Excell Seltzer and this was on 08/28/2021. Subsequently the patient is a poor historian he also gets upset very easily. Of note I do believe that he is going to require some dressings to be packed into the area due to the fact that to be honest this is still tracking all the way straight down to bone where there was pus and purulence coming out once I did get down to that area. Subsequently he tells me that he is having pain although it does seem to be  doing better since the screw was removed. 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 Therefore his best options probably do entail the continue following up by Dr. Sampson Goon for infectious disease coupled with wound care as best we can and try to see if we keep this clean as much as possible and keep it from becoming more infected. At some point there may be a time where this could continually get worse and may end up not doing nearly as well but right now he does not seem to be doing too poorly and I think this is the best way to support him. I am not overly confident that this is ever getting heal however. 09/15/21 upon evaluation today patient appears to be doing well with regard to his wound. He has been tolerating the dressing changes without complication. Fortunately I do not see any signs of active infection at this time. Overall I think that he is doing  quite well. 09-22-2021 upon evaluation today patient appears to be doing about the same in regard to his ankle. 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 He again has been told that there is probably not a likely scenario in which this is able to heal. Nonetheless he is not a HBO candidate due to low ejection fraction. He does see Dr. Sampson Goon he still taking antibiotics at this point we will try to keep the area open so does not develop an abscess. 09-29-2021 upon evaluation today patient appears to be doing well with regard to his wound all things considered. He continues to use the [employer] rope. This is still a fairly deep wound that I think the rope is doing well to help keep the drainage from collecting in the base of the wound there does appear to be less drainage definitely not as purulent as what we have noticed  in the past. There is no increased pain and no signs of worsening in general. 10-06-2021 upon evaluation today patient's wound is continuing to show signs of issues here at this point. Fortunately I do not see any evidence of active infection locally or systemically which is great news but at the same time he is definitely having some ongoing problems here currently. 5/8; 2-week follow-up. Right medial ankle. Small wound purulent drainage. He is on doxycycline chronically for underlying osteomyelitis. He has hardware in this area. He is using Hydrofera Blue packing strips 11-02-2021 upon evaluation today patient appears to be doing much better in regard to his wound I am very pleased to see this. I do not see any evidence of active infection locally nor systemically which is great news. No fevers, chills, nausea, vomiting, or diarrhea. 11-16-2021 upon evaluation today patient appears to be doing well with regard to his ankle ulcer this is a very tiny area remaining at this point. Fortunately I do not see any evidence of infection locally or systemically at this time. Overall I think we are on the right track. 11-30-2021 Upon inspection patient's wound bed actually showed signs of having just a very small opening that still between 1 to 2 mm max. The back end of the sterile Q-tip stick I was able to get down in here and it does still probe down to bone. With that being said he still has drainage as well. Nonetheless he has not wanted any further surgical intervention and so mainly is just more of something were doing to try to manage this as best we can do only thing really were doing a split Hydrofera Blue externally has a lot of new skin covering over where previously had a lot of open area but at the same time I am still concerned about the fact this probes all the way down to bone. 12-14-2021 upon evaluation today patient appears to be doing about the same in regard to his wound. This still actually probes  down to bone. He has not seen ID anytime recently. Has been off of the doxycycline for quite some time. For that reason I really feel like we may need to see about getting something done a little bit more specific for him here and when to try to obtain a culture today so that we can see what that shows and maybe get him on antibiotics that could help this to heal faster. He is in agreement with that plan. 12-28-2021 upon evaluation  today patient appears to be doing worse in regard to his wound. Unfortunately this has sealed up and does appear to be trapping quite a bit of fluid underneath the skin the skin is loose and honestly needs to be trimmed away I discussed that with the patient today. He voiced Jeremy [name], Jeremy [name] ([id number]) [id number] Page 7 of 9 understanding and did consent to the debridement. This is good to be done with scissors and forceps. 01-11-2022 upon evaluation today patient's wound actually is showing signs of being a little better than last time I saw him. I do believe that the doxycycline has been effective in calming this down to some degree. Fortunately I do not see any evidence of active infection at this time systemically although locally he is actually having some signs of cellulitis though this is much better compared to 2 weeks ago when I last saw him. 01-25-2022 upon evaluation today patient appears to be doing well with regard to his foot ulcer all things considered. He still has a wound that goes deeper I think switching back to the [facility] rope is probably can to be better for him at this point. 02-08-2022 upon evaluation today patient appears to be doing well currently in regard to his wound all things considered. This is still chronic osteomyelitis issue at least from the previous evaluations. He has previously been on antibiotics for an extended period of time. Right now I have a Mannam which I started back on July 17 and that was for  2 refills he is still taking that. There was an initial 30-day supply. That should actually carry him from July 17 to October 17. With that being said I am really not sure that we are seeing a whole lot of improvement we seem to have some waxing and waning but would not ever really completely seeing this healed unfortunately. I do think the Hydrofera Blue rope is the best way to go however this still is going all the way down to bone and that has me concerned. I discussed this with the patient before but he really does not want to take any further surgical intervention he mainly is just wanting to try let this heal its own way and he is happy with the way things are going at the moment. 02-23-2022 upon evaluation today patient appears to be doing well currently in regard to his wound all things considered. He still taking the doxycycline which I do have him on long-term in order to keep this under control. He was post to be on it by infectious disease but when that ran out I continued this as he has continued and ongoing chronic osteomyelitis of the ankle. He has been recommended amputation but he does not want to proceed with that. He also is not a HBO candidate at this point. He also tells me that he really feels like it is "doing better even though the size everything is really about the same there is no significant improvement in general here. 03-09-2022 upon evaluation today patient appears to be doing decently well in regard to his wound. This is still probing down to bone however. Again we discussed treatment options which have always included amputation and IV antibiotics but which the patient has declined in the past. Subsequently we have had him on oral antibiotics for some time and this does seem to help to a degree. With that being said I do not see any evidence of infection locally or systemically right now although  he does still have some drainage this is not doing nearly as poorly as what  it had been in the past. With that being said I do think that doing an updated x-ray would not be a bad idea based on where we stand currently as well. That was discussed with the patient today. 03-23-2022 upon evaluation today patient's wound actually is showing signs of doing about the same in regard to the ankle region. Fortunately there does not appear to be any signs of active infection locally nor systemically at this time which is great news. With that being said I did review his x-rays and in fact reviewed the actual images as well. What I found was that I am concerned about the hardware loosening and to be honest I think that this is something that he probably needs to have either revision or removal of the hardware in general in order to try to allow this wound to heal with the hardware there and I feel like going to continue to have issues to be perfectly honest. I discussed that with him today and I would like for him to see Dr. Sanjuana Letters who is a local podiatrist whom I trust to see what he suggest about any possibility of infection from the hardware and whether or not the hardware loosening could be potentially a cause here and something that could be fixed with intervention. 04-06-2022 upon evaluation today patient unfortunately has continued to have this waxing and waning as far as the wound size today it had somewhat scabbed over and there was a lot of fluid underneath and when this open the wound is again larger today compared to what it was last time I saw him. Again I do believe this likely represents an infected hardware/potential osteomyelitis type issue and I discussed that with him today. He wanted to see his x-rays which I did show him today in order to help him understand what we were dealing with and how things were internally. With that being said I explained that I am not a surgeon nor a radiologist. Nonetheless I have looked at a fair number of x-rays and I do believe  that the potential for the fractured screw that is remaining and there may be a site of infection or to be honest any of the other hardware. Therefore I did want him to see a surgeon to discuss options and that is why I did send him to Dr. Sanjuana Letters who he is supposed to be seeing on Friday. 11/7; small wound but easily probes to bone. He has known chronic osteomyelitis and/or hardware infection in this area. He is on antibiotics. I am not certain about orthopedic follow-up.Scant drainage. 04-29-2022 upon evaluation today patient appears to be doing about the same in regard to his wound. He apparently did finally see Dr. Logan Bores although I have not had a chance to review that note as of yet. Fortunately there does not appear to be any signs of infection at this time which is great news. No fevers, chills, nausea, vomiting, or diarrhea. 05-11-2022 upon evaluation today patient appears to be doing well currently in regard to his wound although this is still stable its about the same. Fortunately there does not appear to be any signs of infection at this time which is great news. No fevers, chills, nausea, vomiting, or diarrhea. 05-11-2022 upon evaluation today patient is pretty much the same as far as his wounds are concerned. I do not see any evidence of  infection or worsening overall and in general I think that he is really stalled I do believe he needs to proceed with the surgery with Dr. Logan BoresEvans I think best the only way that he is in to be able to get this to heal as I am firmly convinced that he does have chronic osteomyelitis as well as hardware infection and get the hardware removed this probably can be the only way that he is going to see this closed. I discussed this with him multiple times in the past he has been resistant to proceeding with the surgery however. 05-25-2022 upon evaluation patient's wound really is doing about the same. I do not see that this is worse also do not see that it is  better. Again I discussed with him the possibility of going forward with the surgery with Dr. Logan BoresEvans. He still seems very much on the fence as far as that is concerned. I am not sure which decision to make he tells me that he is going to have the surgery but "just not right now". Objective Constitutional Well-nourished and well-hydrated in no acute distress. Vitals Time Taken: 10:22 AM, Height: 71 in, Weight: 250 lbs, BMI: 34.9, Temperature: 97.6 F, Pulse: 85 bpm, Respiratory Rate: 18 breaths/min, Blood Pressure: 163/79 mmHg. Respiratory normal breathing without difficulty. Psychiatric this patient is able to make decisions and demonstrates good insight into disease process. Alert and Oriented x 3. pleasant and cooperative. General Notes: Upon inspection patient's wound bed actually showed signs of good granulation epithelization at this point. Fortunately there does not appear to be any signs of infection systemically though locally there is definitely infection that we have noted. Again he does have some fractured screws and again Jeremy BottomROWELL, Daelin ([id number]) [id number] Page 8 of 9 some of the areas that were pink if shifted my concern is always been obviously infected hardware which I think is still the biggest concern here. Integumentary (Hair, Skin) Wound #3 status is Open. Original cause of wound was Gradually Appeared. The date acquired was: 08/24/2021. The wound has been in treatment 37 weeks. The wound is located on the Right,Lateral Ankle. The wound measures 0.3cm length x 0.3cm width x 1.1cm depth; 0.071cm^2 area and 0.078cm^3 volume. There is bone and Fat Layer (Subcutaneous Tissue) exposed. There is no tunneling or undermining noted. There is a medium amount of purulent drainage noted. There is medium (34-66%) pink granulation within the wound bed. There is a small (1-33%) amount of necrotic tissue within the wound bed. 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 Bathing/ Shower/ Hygiene: May shower; gently cleanse wound with antibacterial soap, rinse and pat dry prior to dressing wounds No tub bath. Edema Control - Lymphedema / Segmental Compressive Device / Other: Elevate leg(s) parallel to the floor when sitting. DO YOUR BEST to sleep in the bed at night. DO NOT sleep in your recliner. Long hours of sitting in a recliner leads to swelling of the legs and/or potential wounds on your backside. Additional Orders / Instructions: Follow Nutritious Diet and Increase Protein Intake - monitor blood glucose to maintain normal level Medications-Please add to medication list.: P.O. Antibiotics - continue Doxycycline WOUND #3: - Ankle Wound Laterality: Right, Lateral Cleanser: Normal Saline  1 x Per Day/30 Days Discharge Instructions: Wash your hands with soap and water. Remove old dressing, discard into plastic bag and place into trash. Cleanse the wound with Normal Saline prior to applying a clean dressing using gauze sponges, not tissues or cotton balls. Do not scrub or use excessive force. Pat dry using gauze sponges, not tissue or cotton balls. Prim Dressing: Hydrofera Blue Classic Foam Rope Dressing, 9x6 (mm/in) 1 x Per Day/30 Days ary Discharge Instructions: cut it a point and place into wound Secondary Dressing: T Adhesive [employer], 4x4 (in/in) 1 x Per Day/30 Days elfa Discharge Instructions: Apply over dressing to secure in place. 1. Based on what I see I do think that the patient would benefit from going forward with surgery although for now he is  putting this off of it. 2. I am also can recommend based on what we are seeing that we have the patient continue to monitor for any signs of infection were using Hydrofera Blue rope which I feel like is doing decently well with using a T [name] dressing to cover. elfa 3. I do think he would benefit from proceeding with the surgery but again he does not seem to be at the point of making that decision yet. We will see patient back for reevaluation in 1 week here in the clinic. If anything worsens or changes patient will contact our office for additional recommendations. Electronic Signature(s) Signed: 05/25/2022 11:04:15 AM By: Lenda Kelp PA-C Entered By: Lenda Kelp on 05/25/2022 11:04:14 -------------------------------------------------------------------------------- SuperBill Details Patient Name: Date of Service: [address] [place] [facility] 05/25/2022 [place], Gaynell Face ([id number]) [id number] Page 9 of 9 Medical Record Number: [id number] Patient Account Number: [id number] Date of Birth/Sex: Treating RN: Nov 03, 1955 (65 y.o. 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 E11.622 Type 2 diabetes mellitus with other skin ulcer I10 Essential (primary) hypertension I25.10 Atherosclerotic heart disease of native coronary artery without angina pectoris Facility Procedures : CPT4 Code: [id number] Description: 99213 - WOUND CARE VISIT-LEV 3 EST PT Modifier: Quantity: 1 Physician Procedures : CPT4 Code Description Modifier [id number] - WC PHYS LEVEL 3 - EST PT ICD-10 Diagnosis Description M86.471  Chronic osteomyelitis with draining sinus, right ankle and foot L97.316 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

## 2022-06-08 ENCOUNTER — Ambulatory Visit: Payer: Medicare Other | Admitting: Internal Medicine

## 2022-06-17 ENCOUNTER — Ambulatory Visit: Payer: Medicare Other | Admitting: Physician Assistant

## 2022-06-18 ENCOUNTER — Ambulatory Visit (INDEPENDENT_AMBULATORY_CARE_PROVIDER_SITE_OTHER): Payer: Medicare HMO | Admitting: Podiatry

## 2022-06-18 DIAGNOSIS — L97312 Non-pressure chronic ulcer of right ankle with fat layer exposed: Secondary | ICD-10-CM | POA: Diagnosis not present

## 2022-06-18 NOTE — Progress Notes (Signed)
Chief Complaint  Patient presents with   foolow up     Right foot     Subjective:  67 y.o. male with PMHx of diabetes mellitus presenting today as a referral from the [facility] wound care center for evaluation of an ulcer to the lateral aspect of the ankle.  Patient has a history of ORIF right ankle in 2002 at [facility].  This reported history is from the patient.  Patient states that the surgery did not do well and he has had a wound to the lateral aspect of the right ankle ever since the surgery over 20 years ago.  The wound probes to bone.  He has been treated at the [facility] wound care center who referred him here  Patient is a smoker half pack per day x 46 years   Past Medical History:  Diagnosis Date   Coronary artery disease    Diabetes mellitus without complication ([place])    Hypertension    S/P CABG x 1     Past Surgical History:  Procedure Laterality Date   ANKLE ARTHROSCOPY W/ OPEN REPAIR Right    CORONARY ARTERY BYPASS GRAFT     PULMONARY THROMBECTOMY Bilateral 12/24/2020   Procedure: PULMONARY THROMBECTOMY;  Surgeon: Algernon Huxley, MD;  Location: [place] [facility];  Service: Cardiovascular;  Laterality: Bilateral;    Allergies  Allergen Reactions   Amoxicillin Swelling   Keflex [Cephalexin] Rash     Objective/Physical Exam General: The patient is alert and oriented x3 in no acute distress.  Dermatology:  Unchanged since last visit.  Wound #1 noted to the lateral aspect of the right ankle approximately 0.6 x 0.6 x 1.2 cm (LxWxD).  There is direct probing to hardware of the right ankle  To the noted ulceration(s), there is no eschar. There is a moderate amount of slough, fibrin, and necrotic tissue noted.  Serosanguineous drainage noted.  The wound does probe to metal hardware and bone  Vascular: Chronic venous stasis with edema bilateral lower extremities  Neurological: Epicritic and protective threshold diminished bilaterally.   Musculoskeletal Exam: History  of ORIF RT ankle  Radiographic exam RT ankle 03/09/2022: IMPRESSION: 1. Interval removal of an inter fragmentary screw from the distal fibula. 2. Interval increase in size of a shallow ulceration lateral to the distal fibular metadiaphysis. No associated osseous erosion to suggest osteomyelitis. 3. Tibiotalar arthrodesis.  Assessment: 1.  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 diabetes mellitus w/ peripheral neuropathy 3.  Exposed hardware within the ulcer -Patient was evaluated again today.  X-rays reviewed again as well today. -I still believe that the patient needs to have his hardware removed with any attempt to salvage the foot and prevent below-knee amputation.  Prior to this however, recommend vascular workup. -Order placed for ABIs right lower extremity -Patient will also need infectious disease after surgery for antibiotic management/regimen.  Will plan to consult them as we get closer to the removal of hardware date -Surgery will consist of removal of hardware with bone biopsy and culture of the hardware right ankle -Return to clinic in 4 weeks   Edrick Kins, DPM [facility]  Dr. Edrick Kins, DPM    [address],  [place] [address]                Office [phone]  Fax [phone]

## 2022-06-21 ENCOUNTER — Ambulatory Visit: Payer: Medicare Other | Admitting: Physician Assistant

## 2022-06-28 ENCOUNTER — Ambulatory Visit: Payer: Self-pay | Admitting: Physician Assistant

## 2022-07-05 ENCOUNTER — Ambulatory Visit: Payer: Self-pay | Admitting: Physician Assistant

## 2022-07-08 ENCOUNTER — Ambulatory Visit: Payer: Medicaid Other | Admitting: Podiatry

## 2022-07-20 ENCOUNTER — Ambulatory Visit: Payer: Medicare Other | Admitting: Podiatry

## 2022-08-23 DIAGNOSIS — Z86711 Personal history of pulmonary embolism: Secondary | ICD-10-CM | POA: Diagnosis not present

## 2022-08-23 DIAGNOSIS — E119 Type 2 diabetes mellitus without complications: Secondary | ICD-10-CM | POA: Diagnosis not present

## 2022-08-23 DIAGNOSIS — F17210 Nicotine dependence, cigarettes, uncomplicated: Secondary | ICD-10-CM | POA: Diagnosis not present

## 2022-08-23 DIAGNOSIS — M19071 Primary osteoarthritis, right ankle and foot: Secondary | ICD-10-CM | POA: Diagnosis not present

## 2022-08-23 DIAGNOSIS — I5022 Chronic systolic (congestive) heart failure: Secondary | ICD-10-CM | POA: Diagnosis not present

## 2022-08-23 DIAGNOSIS — Z951 Presence of aortocoronary bypass graft: Secondary | ICD-10-CM | POA: Diagnosis not present

## 2022-08-23 DIAGNOSIS — I4891 Unspecified atrial fibrillation: Secondary | ICD-10-CM | POA: Diagnosis not present

## 2022-08-24 IMAGING — US US EXTREM LOW VENOUS
1 series · 13 of 24 positions shown · non-contrast
Comparison: None.

CLINICAL DATA: Known pulmonary emboli.



[Series 1: us venous img lower bilat (dvt) · portal-venous · 13 of 69 slices shown]
[im 1/69]
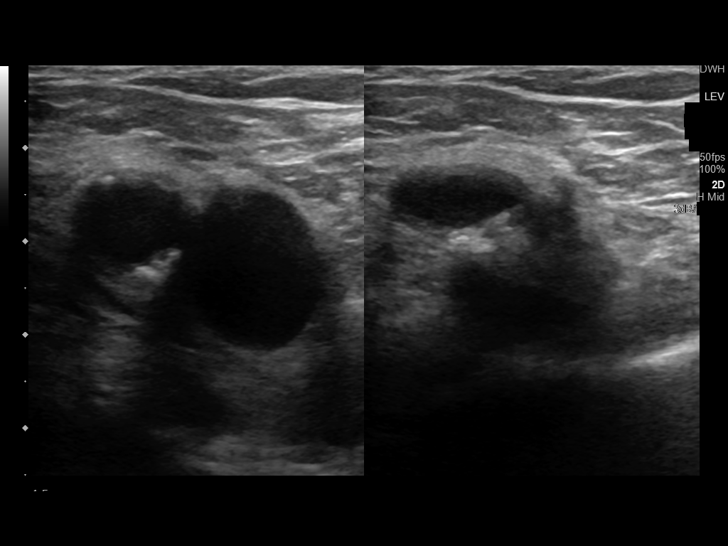
[im 6/69]
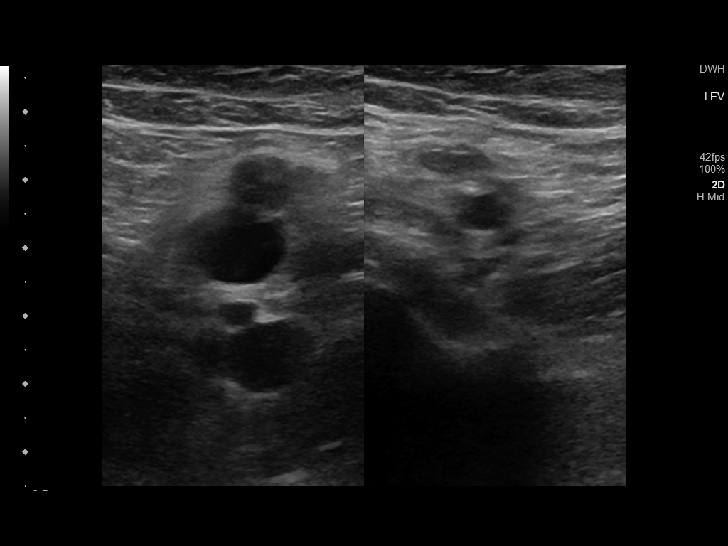
[im 12/69]
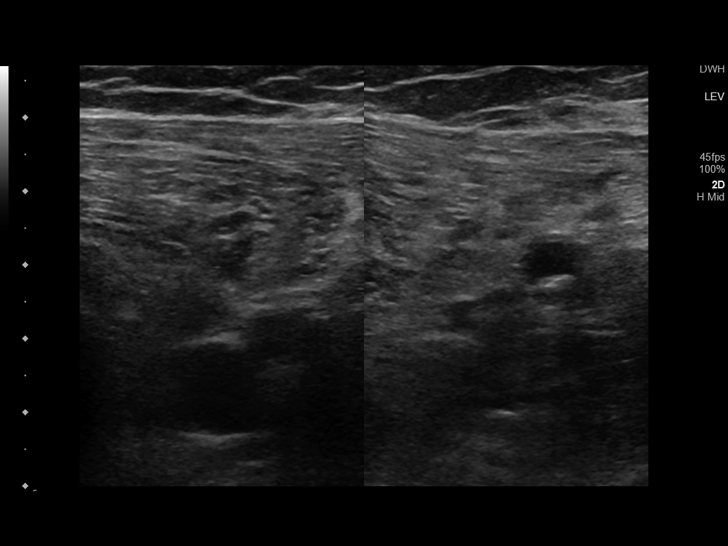
[im 18/69]
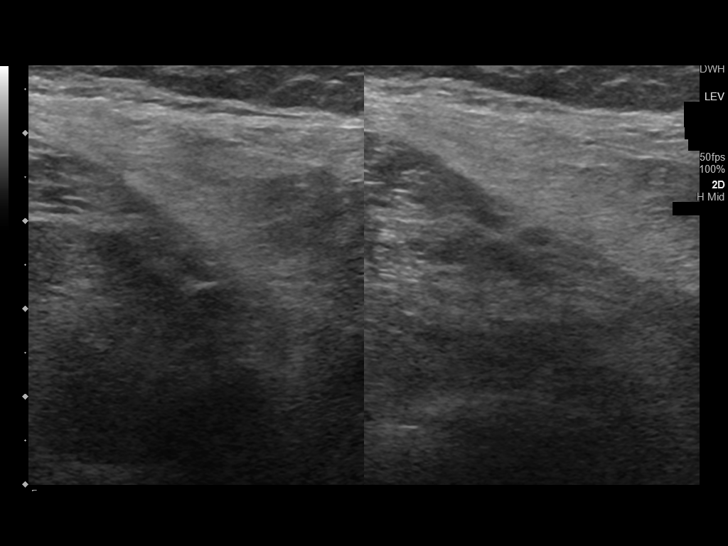
[im 24/69]
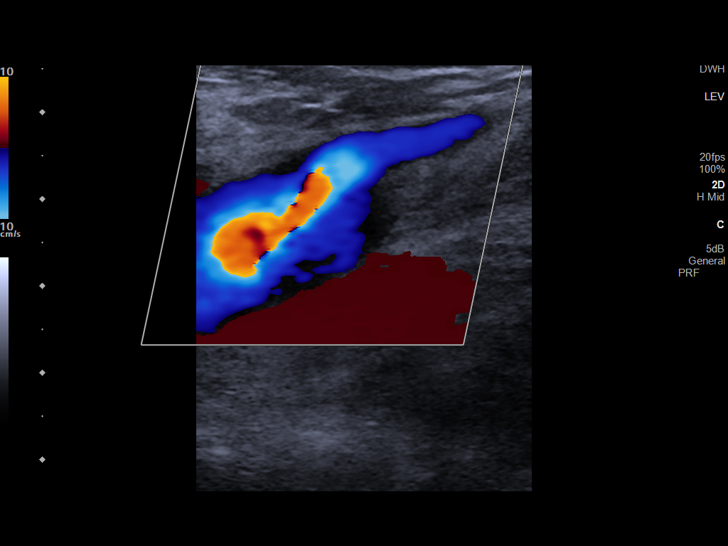
[im 30/69]
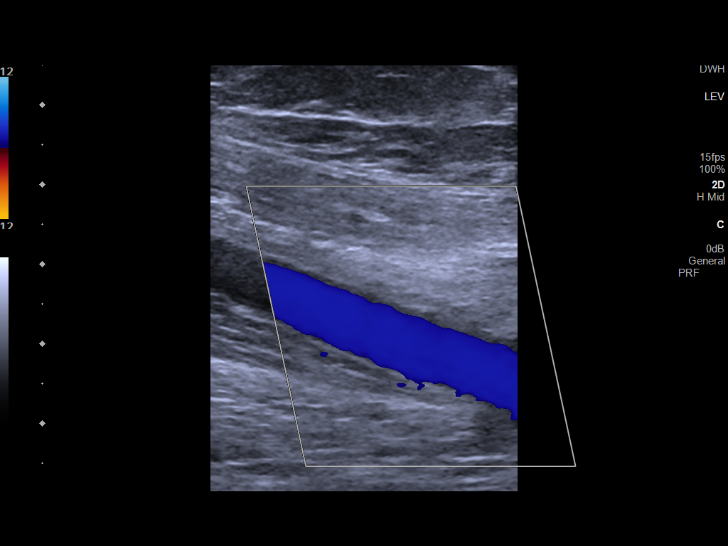
[im 36/69]
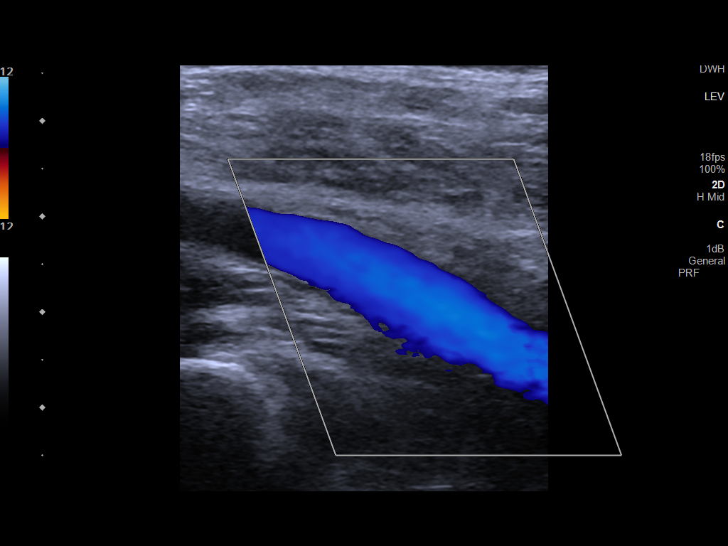
[im 39/69]
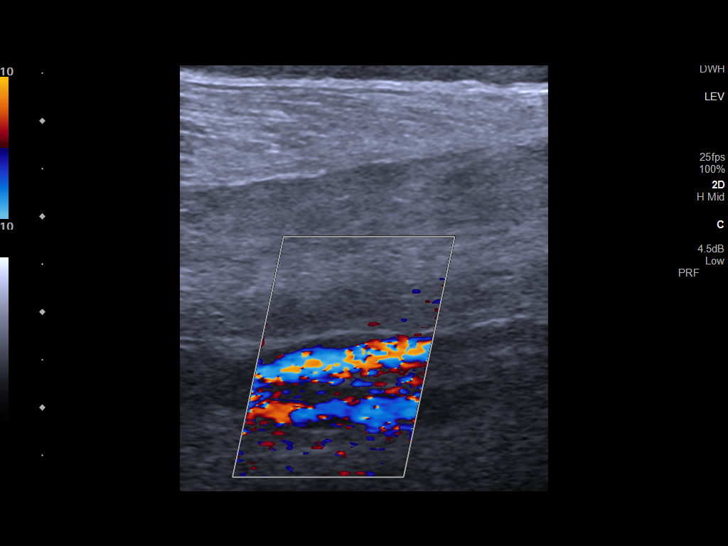
[im 45/69]
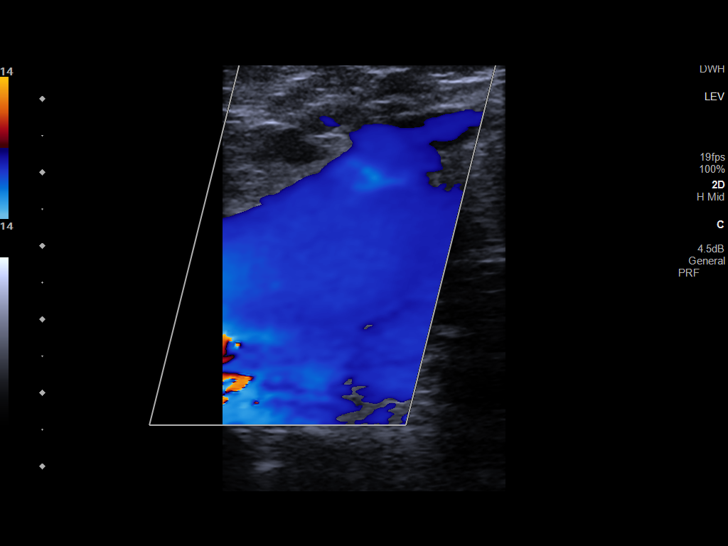
[im 51/69]
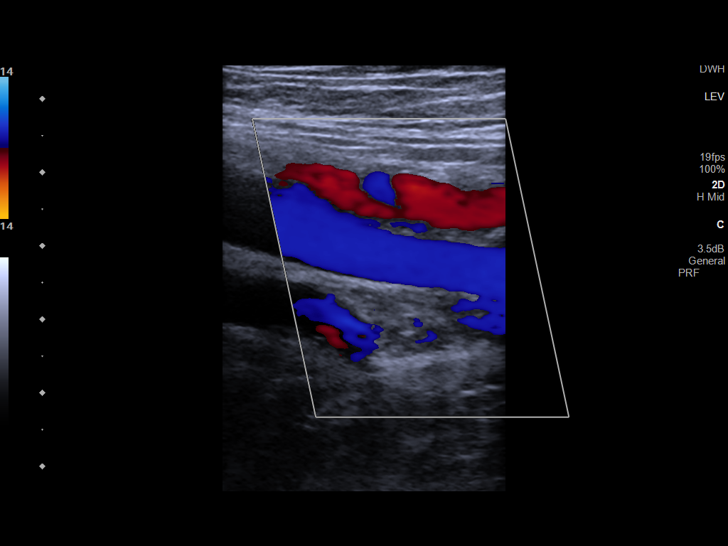
[im 57/69]
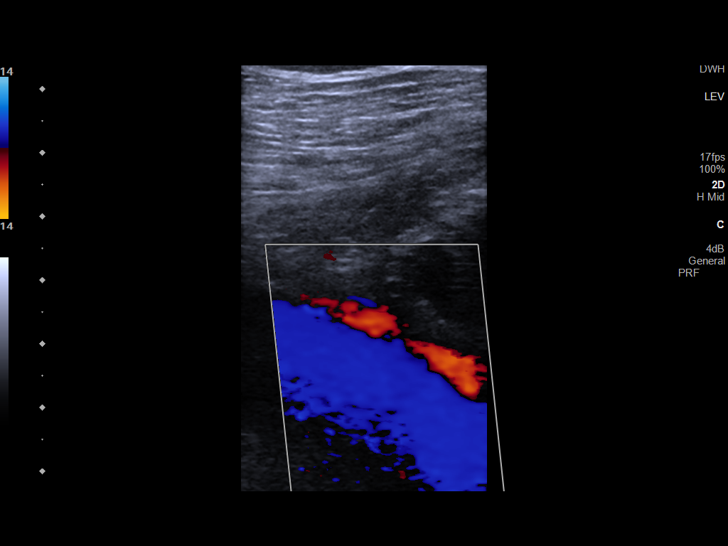
[im 63/69]
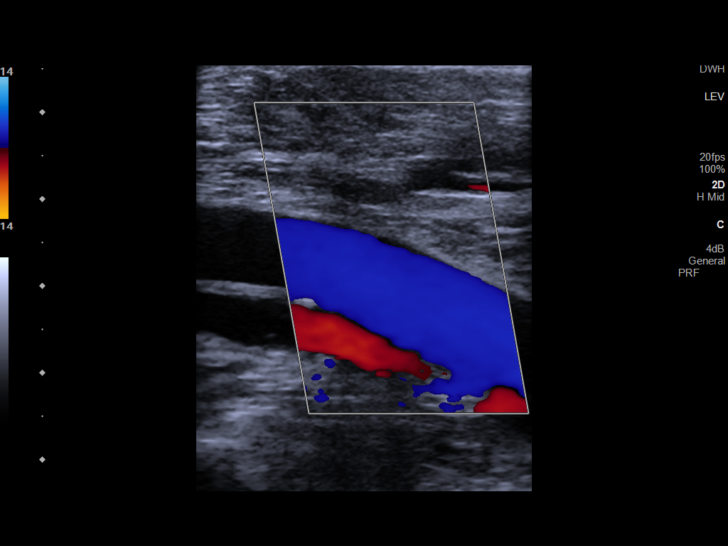
[im 69/69]
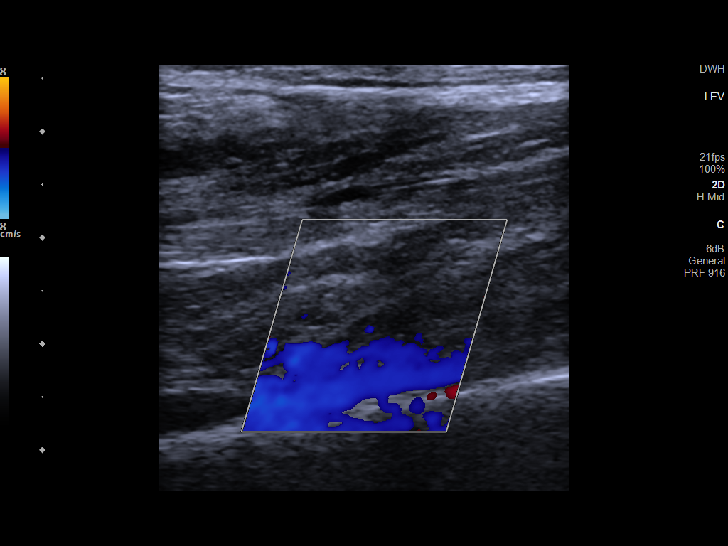

[13 of 24 positions shown; findings below may reference images not displayed]

FINDINGS: RIGHT LOWER EXTREMITY

Common Femoral Vein: No evidence of thrombus. Normal
compressibility, respiratory phasicity and response to augmentation.

Saphenofemoral Junction: No evidence of thrombus. Normal
compressibility and flow on color Doppler imaging.

Profunda Femoral Vein: No evidence of thrombus. Normal
compressibility and flow on color Doppler imaging.

Femoral Vein: No evidence of thrombus. Normal compressibility,
respiratory phasicity and response to augmentation.

Popliteal Vein: No evidence of thrombus. Normal compressibility,
respiratory phasicity and response to augmentation.

Calf Veins: No evidence of thrombus. Normal compressibility and flow
on color Doppler imaging.

Superficial Great Saphenous Vein: No evidence of thrombus. Normal
compressibility.

Venous Reflux:  None.

Other Findings:  None.

LEFT LOWER EXTREMITY

Common Femoral Vein: No evidence of thrombus. Normal
compressibility, respiratory phasicity and response to augmentation.

Saphenofemoral Junction: No evidence of thrombus. Normal
compressibility and flow on color Doppler imaging.

Profunda Femoral Vein: No evidence of thrombus. Normal
compressibility and flow on color Doppler imaging.

Femoral Vein: No evidence of thrombus. Normal compressibility,
respiratory phasicity and response to augmentation.

Popliteal Vein: No evidence of thrombus. Normal compressibility,
respiratory phasicity and response to augmentation.

Calf Veins: No evidence of thrombus. Normal compressibility and flow
on color Doppler imaging.

Superficial Great Saphenous Vein: No evidence of thrombus. Normal
compressibility.

Venous Reflux:  None.

Other Findings:  None.
IMPRESSION: No evidence of deep venous thrombosis in either lower extremity.

## 2022-08-27 IMAGING — DX DG CHEST 1V PORT
1 series · 1 of 1 positions shown · non-contrast
Comparison: Prior chest radiographs 12/25/2020 and earlier.

CLINICAL DATA: Status post peripherally inserted central catheter
(PICC) central line placement.

EXAM:
PORTABLE CHEST 1 VIEW

[chest ap]
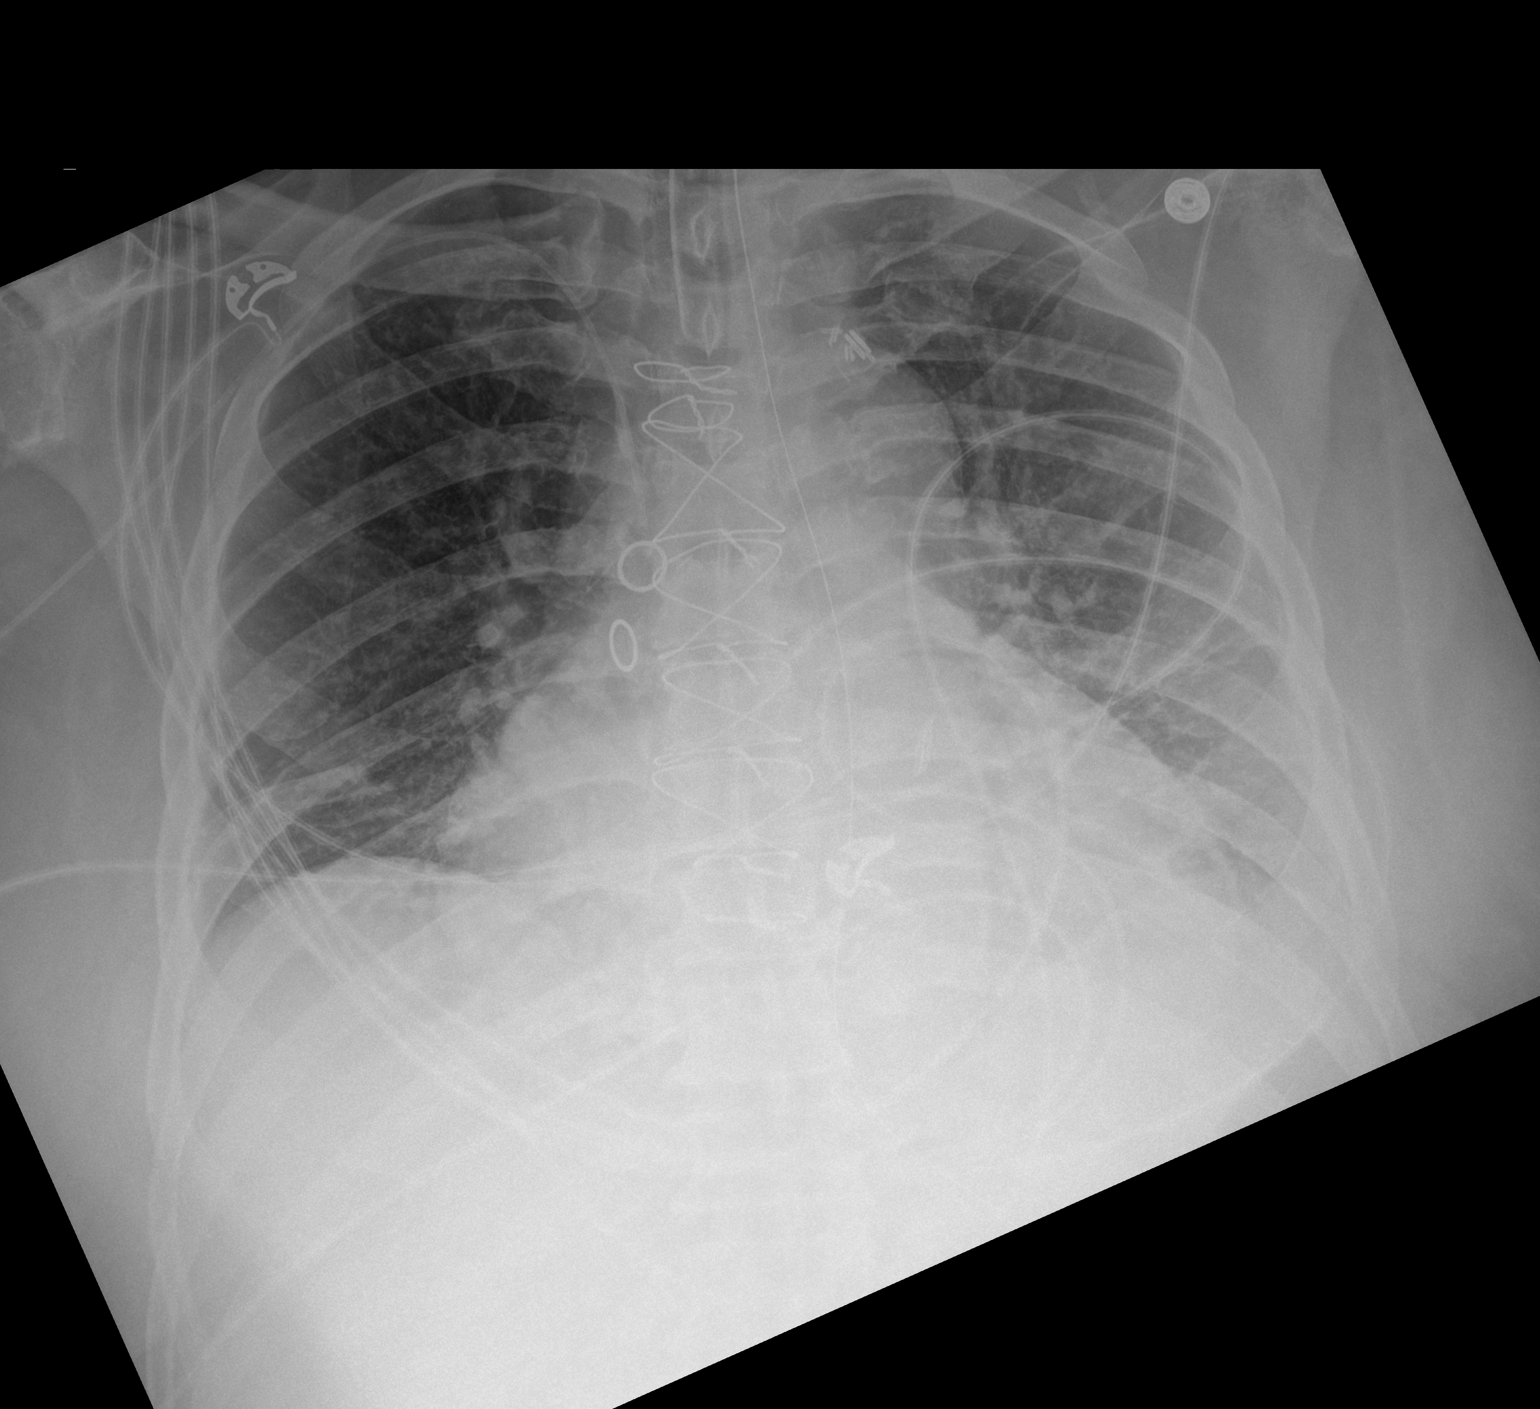

[1 of 1 positions shown; findings below may reference images not displayed]

FINDINGS: A right-sided PICC is now present with tip projecting at the level
of the lower SVC. The ET tube terminates just below the level of the
clavicular heads. An enteric tube passes below the level of the left
hemidiaphragm with tip excluded from the field of view.

Prior median sternotomy and CABG. Unchanged cardiomegaly. Central
pulmonary vascular congestion. Minimal atelectasis at the right lung
base. No sizable pleural effusion or evidence of pneumothorax. No
acute bony abnormality identified.
IMPRESSION: Right-sided PICC with tip projecting at the level of the lower SVC.

Position of additional lines/tubes, as described.

Unchanged cardiomegaly with central pulmonary vascular congestion.

Minimal atelectasis at the right lung base.

## 2022-08-27 IMAGING — CT CT HEAD W/O CM
4 of 5 series · 15 of 47 positions shown, 17 images · non-contrast
Comparison: Head CT 12/25/2020.

CLINICAL DATA: 64-year-old male status post acute pulmonary emboli.
Altered mental status with age indeterminate hypodensity in the
right caudate on head CT yesterday. Found with altered mental status
now. Intubated.

EXAM:
CT HEAD WITHOUT CONTRAST
TECHNIQUE: Contiguous axial images were obtained from the base of the skull
through the vertex without intravenous contrast.

[Series 2: head wo · axial · 0.47mm/px · z∈[-129,-39]mm · 4 of 32 slices shown]
[im 7/32  brain]
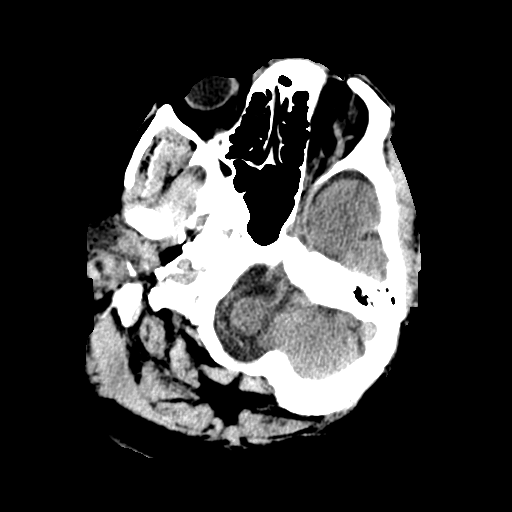
[im 13/32  brain]
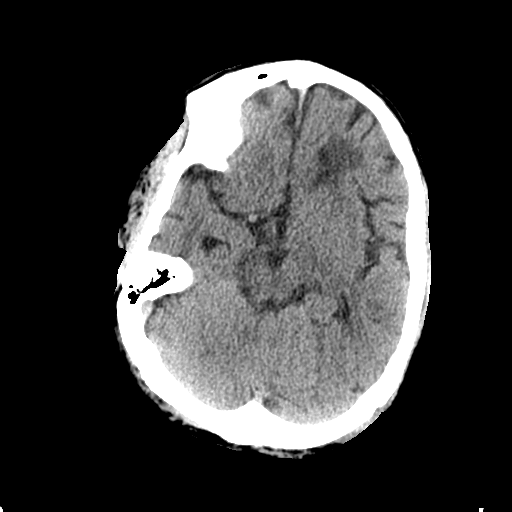
[im 19/32  brain]
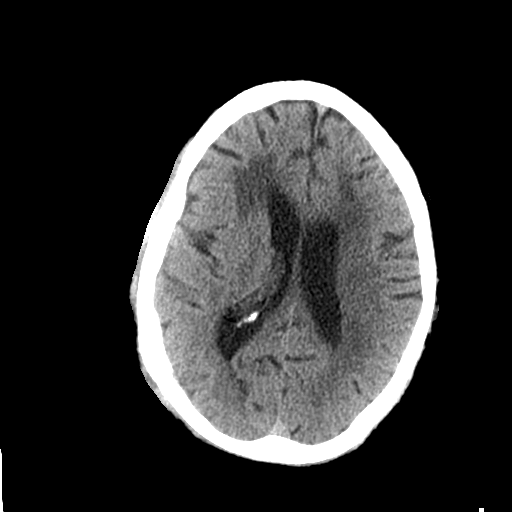
[im 25/32  brain]
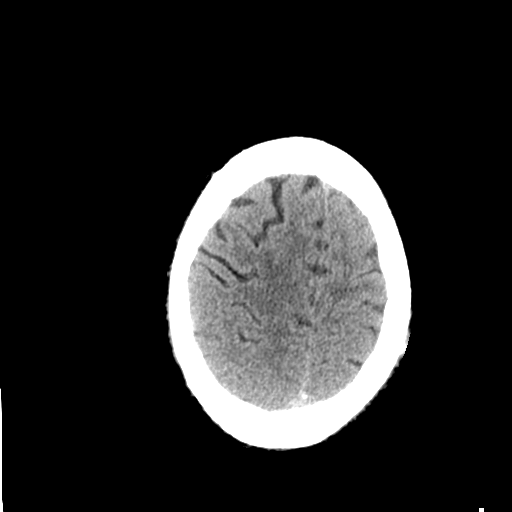

[Series 4: coronal soft tissue · coronal · 0.36mm/px · 3 of 75 slices shown]
[im 25/75  brain]
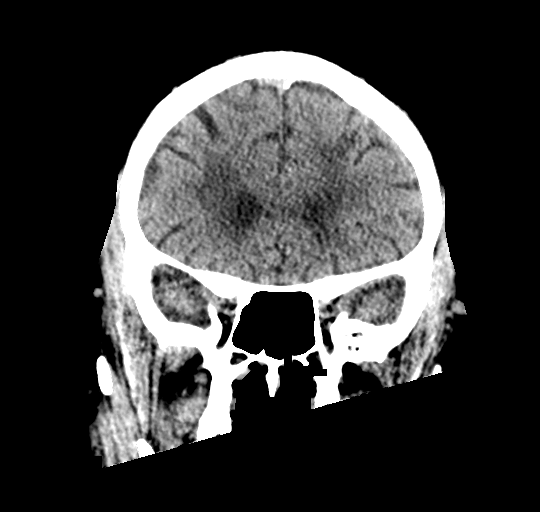
[im 33/75  brain]
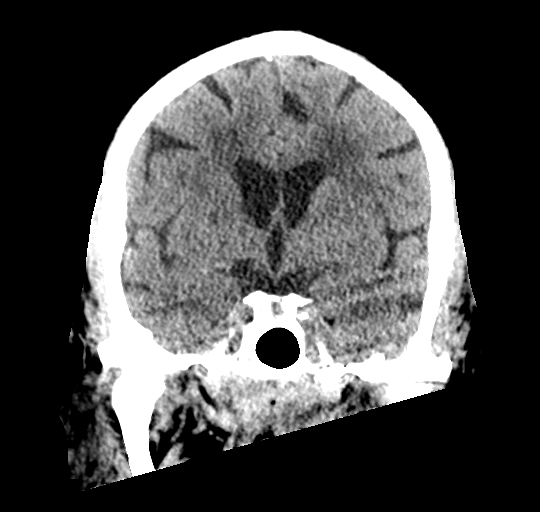
[im 42/75  brain]
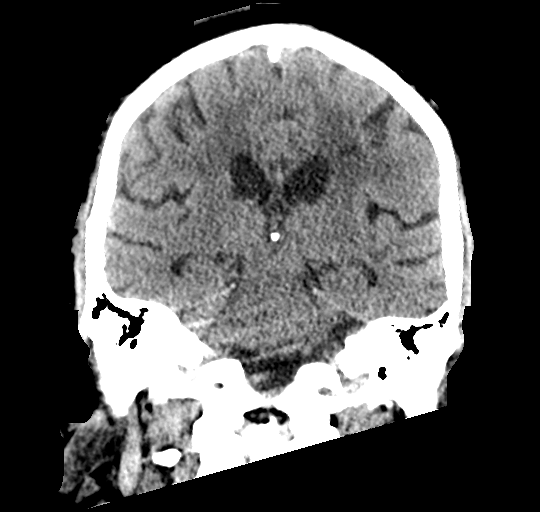

[Series 5: sagittal soft tissue · sagittal · 0.36mm/px · 3 of 56 slices shown]
[im 24/56  brain]
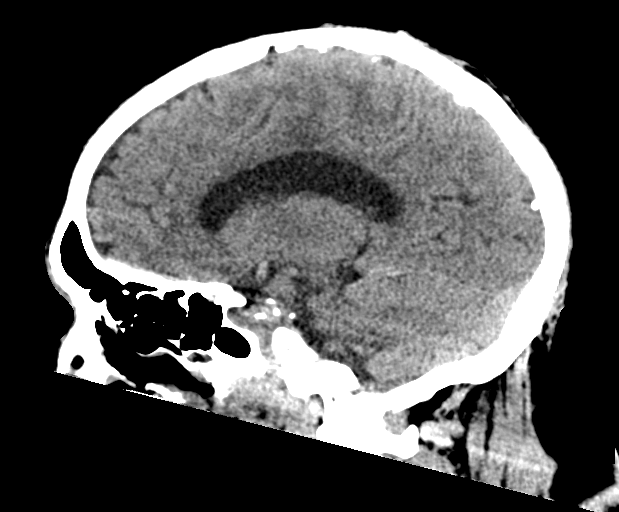
[im 28/56  brain]
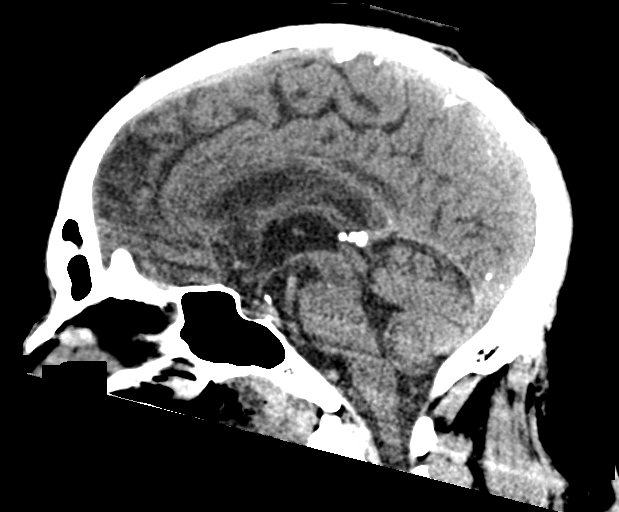
[im 32/56  brain]
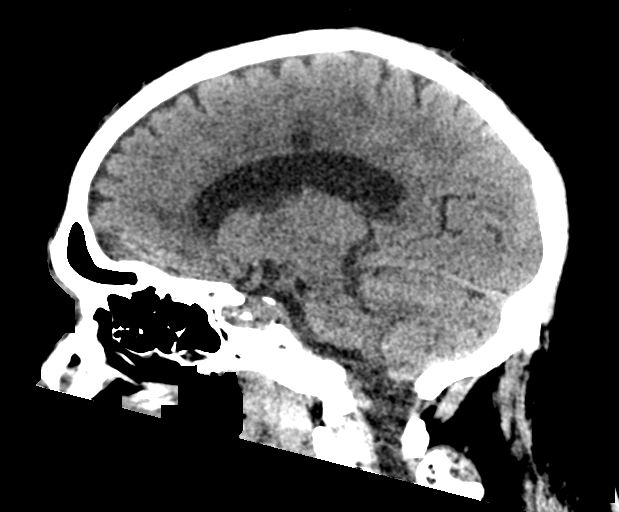

[Series 6: ax head wo recon · axial · 0.35mm/px · z∈[-146,-30]mm · 5 of 38 slices shown, 7 images]
[im 7/38  brain]
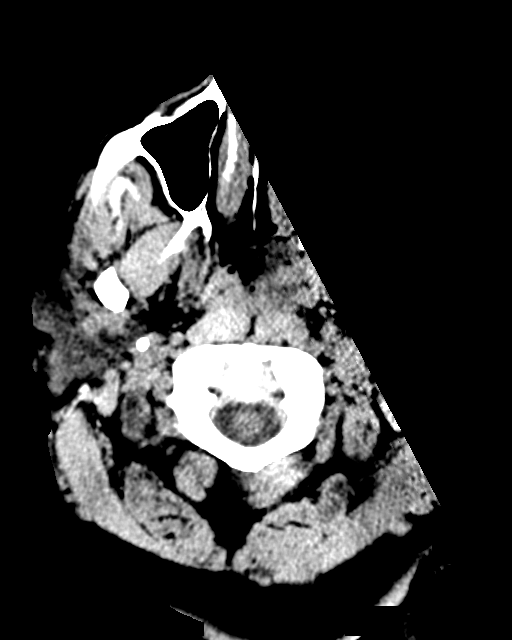
[im 7/38  bone]
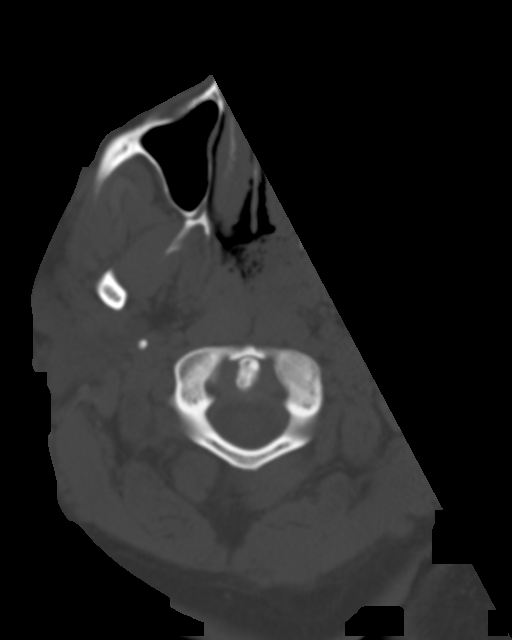
[im 13/38  brain]
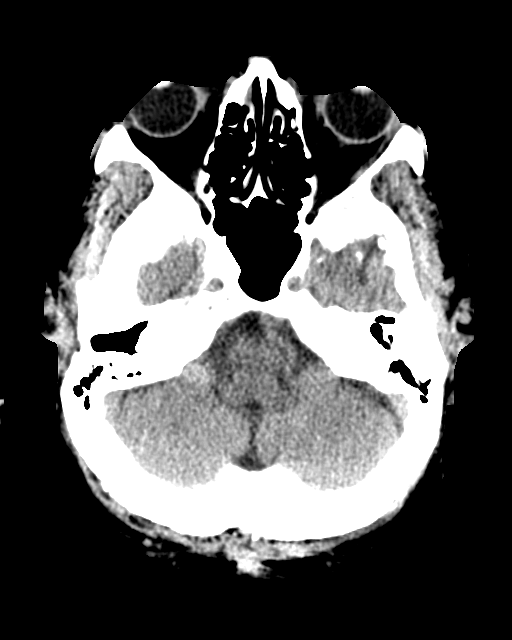
[im 19/38  brain]
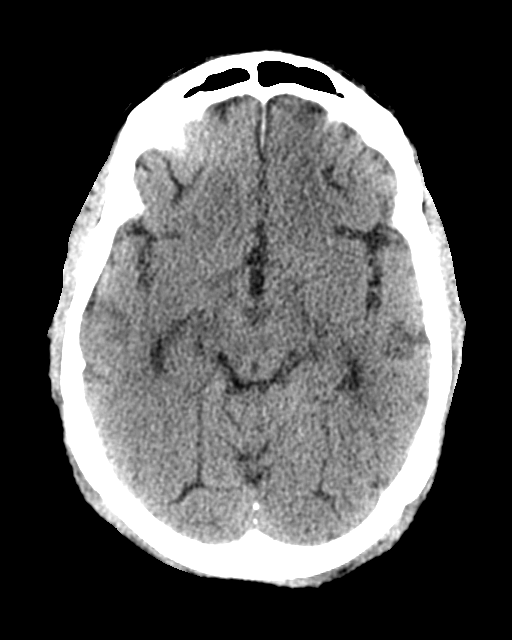
[im 25/38  brain]
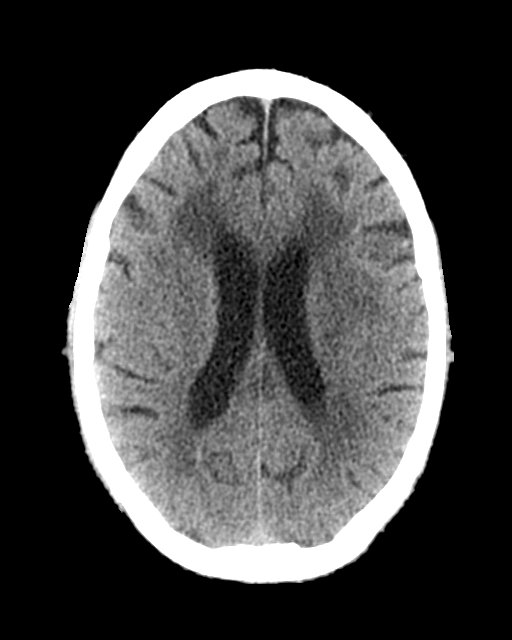
[im 31/38  brain]
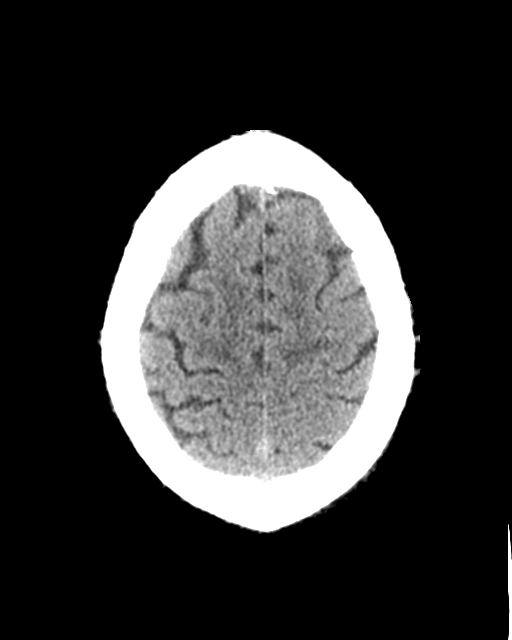
[im 31/38  bone]
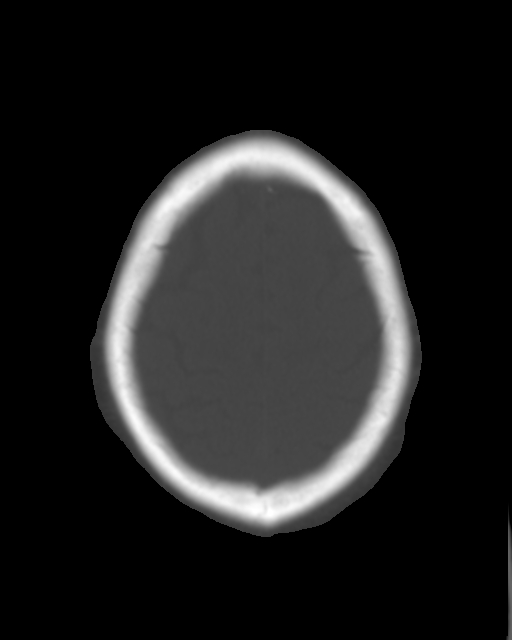

[15 of 47 positions shown; findings below may reference images not displayed]

FINDINGS: Brain: Unchanged asymmetric hypodensity in the right caudate (series
6, image 16) and patchy bilateral cerebral white matter hypodensity
since yesterday. Focal cystic encephalomalacia near the body of the
right corpus callosum is stable on sagittal image 25. Small chronic
appearing lacunar infarct of the right cerebellum on series 6, image
23 is stable.

No midline shift, ventriculomegaly, mass effect, evidence of mass
lesion, intracranial hemorrhage or evidence of cortically based
acute infarction. No cortical encephalomalacia identified.

Vascular: Calcified atherosclerosis at the skull base. No suspicious
intracranial vascular hyperdensity.

Skull: Stable and intact.

Sinuses/Orbits: Visualized paranasal sinuses and mastoids are stable
and well aerated.

Other: No acute orbit or scalp soft tissue finding. Fluid in the
visible pharynx related to intubation.
IMPRESSION: 1. Unchanged age indeterminate small vessel disease in the cerebral
hemispheres, greater on the right.
2. No new intracranial abnormality.

## 2022-08-30 ENCOUNTER — Ambulatory Visit: Payer: Medicare HMO | Admitting: Physician Assistant

## 2022-09-02 ENCOUNTER — Encounter: Payer: Medicare HMO | Attending: Physician Assistant | Admitting: Physician Assistant

## 2022-09-02 DIAGNOSIS — M1990 Unspecified osteoarthritis, unspecified site: Secondary | ICD-10-CM | POA: Insufficient documentation

## 2022-09-02 DIAGNOSIS — L97316 Non-pressure chronic ulcer of right ankle with bone involvement without evidence of necrosis: Secondary | ICD-10-CM | POA: Diagnosis not present

## 2022-09-02 DIAGNOSIS — M86471 Chronic osteomyelitis with draining sinus, right ankle and foot: Secondary | ICD-10-CM | POA: Diagnosis not present

## 2022-09-02 DIAGNOSIS — I509 Heart failure, unspecified: Secondary | ICD-10-CM | POA: Insufficient documentation

## 2022-09-02 DIAGNOSIS — I110 Hypertensive heart disease with heart failure: Secondary | ICD-10-CM | POA: Insufficient documentation

## 2022-09-02 DIAGNOSIS — I2510 Atherosclerotic heart disease of native coronary artery without angina pectoris: Secondary | ICD-10-CM | POA: Insufficient documentation

## 2022-09-02 DIAGNOSIS — E11621 Type 2 diabetes mellitus with foot ulcer: Secondary | ICD-10-CM | POA: Diagnosis not present

## 2022-09-04 NOTE — Progress Notes (Signed)
Jeremy, [name] ([id number]) [id number] Page 1 of 2 Visit Report for 09/02/2022 ROS/PFSH Details Patient Name: Date of Service: [place], [place] RSHA LL 09/02/2022 8:45 A M Medical Record Number: [id number] Patient Account Number: [id number] Date of Birth/Sex: Treating RN: 1956/01/30 (67 y.o. Jeremy [name]) Carlene Coria Primary Care Provider: Tracie Harrier Other Clinician: Referring Provider: Treating Provider/Extender: Jeri Cos Self, Referral Weeks in Treatment: 0 Information Obtained From Patient Integumentary (Skin) Complaints and Symptoms: Positive for: Wounds; Swelling Medical History: Negative for: History of Burn; History of pressure wounds Eyes Medical History: Negative for: Cataracts; Glaucoma; Optic Neuritis Ear/Nose/Mouth/Throat Medical History: Negative for: Chronic sinus problems/congestion; Middle ear problems Hematologic/Lymphatic Medical History: Negative for: Anemia; Hemophilia; Human Immunodeficiency Virus; Lymphedema; Sickle Cell Disease Respiratory Medical History: Negative for: Aspiration; Asthma; Chronic Obstructive Pulmonary Disease (COPD); Pneumothorax; Sleep Apnea; Tuberculosis Cardiovascular Medical History: Positive for: Arrhythmia - a fib; Congestive Heart Failure; Coronary Artery Disease; Hypertension Negative for: Angina; Deep Vein Thrombosis; Hypotension; Myocardial Infarction; Peripheral Arterial Disease; Peripheral Venous Disease; Phlebitis; Vasculitis Past Medical History Notes: CABGx1 Gastrointestinal Medical History: Negative for: Cirrhosis ; Colitis; Crohns; Hepatitis A; Hepatitis B; Hepatitis C Endocrine Medical History: Positive for: Type II Diabetes Time with diabetes: 5 years Treated with: Oral agents, Diet Blood sugar tested every day: No Genitourinary Medical History: Negative for: End Stage Renal Disease Immunological Medical History: Negative for: Lupus Erythematosus; Raynauds;  Scleroderma Musculoskeletal Jeremy [name], Jeremy [name] ([id number]) [id number] Page 2 of 2 Medical History: Positive for: Osteoarthritis; Osteomyelitis - hx Negative for: Gout; Rheumatoid Arthritis Neurologic Medical History: Negative for: Dementia; Neuropathy; Quadriplegia; Paraplegia; Seizure Disorder Oncologic Medical History: Negative for: Received Chemotherapy; Received Radiation Psychiatric Medical History: Negative for: Anorexia/bulimia; Confinement Anxiety Immunizations Pneumococcal Vaccine: Received Pneumococcal Vaccination: No Implantable Devices None Family and Social History Current every day smoker - 40 years; Marital Status - Divorced; Alcohol Use: Moderate - beer; Drug Use: Current History - weed; Caffeine Use: Daily; Financial Concerns: No; Food, Clothing or Shelter Needs: No; Support System Lacking: No; Transportation Concerns: No Electronic Signature(s) Signed: 09/02/2022 5:06:45 PM By: Worthy Keeler PA-C Signed: 09/03/2022 11:27:58 AM By: Carlene Coria RN Entered By: Carlene Coria on 09/02/2022 09:10:13

## 2022-09-04 NOTE — Progress Notes (Signed)
Jeremy [name] ([id number]) [id number] Page 1 of 3 Visit Report for 09/02/2022 Allergy List Details Patient Name: Date of Service: Jeremy [name], [place] RSHA LL 09/02/2022 8:45 A M Medical Record Number: [id number] Patient Account Number: [id number] Date of Birth/Sex: Treating RN: 1955/10/11 (67 y.o. [name]) Carlene Coria Primary Care Zechariah Bissonnette: Tracie Harrier Other Clinician: Referring Amari Burnsworth: Treating Mavery Milling/Extender: Jeri Cos Self, Referral Weeks in Treatment: 0 Allergies Active Allergies amoxicillin Keflex Allergy Notes Electronic Signature(s) Signed: 09/03/2022 11:27:58 AM By: Carlene Coria RN Entered By: Carlene Coria on 09/02/2022 08:56:20 -------------------------------------------------------------------------------- [place] Details Patient Name: Date of Service: Jeremy Georgeanna Harrison, MA RSHA LL 09/02/2022 8:45 A M Medical Record Number: [id number] Patient Account Number: [id number] Date of Birth/Sex: Treating RN: 28-Nov-1955 (67 y.o. [name]) Carlene Coria Primary Care Mitra Duling: Tracie Harrier Other Clinician: Referring Dorlis Judice: Treating Zlaty Alexa/Extender: Jeri Cos Self, Referral Weeks in Treatment: 0 Visit Information Patient Arrived: Ambulatory Arrival Time: 08:52 Accompanied By: self Transfer Assistance: None Patient Identification Verified: Yes Secondary Verification Process Completed: Yes Patient Requires Transmission-Based Precautions: No Patient Has Alerts: Yes Patient Alerts: Patient on Blood Thinner DIABETIC History Since Last Visit Added or deleted any medications: No Any new allergies or adverse reactions: No Had a fall or experienced change in activities of daily living that may affect risk of falls: No Signs or symptoms of abuse/neglect since last visito No Hospitalized since last visit: No Implantable device outside of the clinic excluding cellular tissue based products placed in the center since last visit: No Electronic  Signature(s) Signed: 09/03/2022 11:27:58 AM By: Carlene Coria RN Entered By: Carlene Coria on 09/02/2022 08:53:31 -------------------------------------------------------------------------------- General Visit Notes Details Patient Name: Date of Service: Jeremy Georgeanna Harrison, MA RSHA LL 09/02/2022 8:45 A M Medical Record Number: [id number] Patient Account Number: [id number] Date of Birth/Sex: Treating RN: Oct 24, 1955 (67 y.o. [name]) Carlene Coria Primary Care Megan Presti: Tracie Harrier Other Clinician: Referring Idil Maslanka: Treating Hershal Eriksson/Extender: Jeri Cos Self, Referral Weeks in Treatment: 0 Notes brought patient back started visit , obtained vital signs, asked patient about where his wound was, I asked patient about where things were with Dr Amalia Hailey as Jeremy [name], [place] ([id number]) [id number] Page 2 of 3 had seen on his chart, he was supposed to have surgery but didnt . In a very angry voice stated Why would I have surgery if my foot is getting well. I proceeded asking questions about height weight, pain assessment, patient started shaking his finger at me, while leaning forward and in a very angry voice asked why are you asking so many questions, I asked the patient to please not point at me, that I was sorry but the questions I was asking him were part of the paperwork that had to be completed for a new patient. I proceeded with the assessments for allergies, health history patient asked again in a very angry voice why I was asking all these questions as they had not done this before when he was here. I explained to patient that on a first visit we have a series of questions/forms we have to fill out so that we can get a history of his health. In a very angry voice patient said let me down! I lowered the chair, the patient got up and in a very angry voice asked me do they have all these questions on the second visit and I said no, its only the first to get an overview of  the patient health. Patient walked out of the room and proceed up to the front desk. Electronic Signature(s) Signed: 09/03/2022  11:27:58 AM By: Carlene Coria RN Entered By: Carlene Coria on 09/02/2022 10:09:30 -------------------------------------------------------------------------------- Pain Assessment Details Patient Name: Date of Service: Jeremy [name], [place] RSHA LL 09/02/2022 8:45 A M Medical Record Number: [id number] Patient Account Number: [id number] Date of Birth/Sex: Treating RN: Aug 10, 1955 (67 y.o. [name]) Carlene Coria Primary Care Malena Timpone: Tracie Harrier Other Clinician: Referring Yukio Bisping: Treating Liseth Wann/Extender: Jeri Cos Self, Referral Weeks in Treatment: 0 Active Problems Location of Pain Severity and Description of Pain Patient Has Paino Yes Site Locations With Dressing Change: Yes Duration of the Pain. Constant / Intermittento Intermittent How Long Does it Lasto Hours: Minutes: 15 Rate the pain. Current Pain Level: 5 Worst Pain Level: 10 Least Pain Level: 0 Tolerable Pain Level: 5 Character of Pain Describe the Pain: Aching Pain Management and Medication Current Pain Management: Medication: Yes Cold Application: No Rest: Yes Massage: No Activity: No T.E.N.S.: No Heat Application: No Leg drop or elevation: No Is the Current Pain Management Adequate: Inadequate How does your wound impact your activities of daily livingo Sleep: No Bathing: No Appetite: No Relationship With Others: No Bladder Continence: No Emotions: No Bowel Continence: No Work: No Toileting: No Drive: No Dressing: No Hobbies: No Electronic Signature(s) Signed: 09/03/2022 11:27:58 AM By: Carlene Coria RN Entered By: Carlene Coria on 09/02/2022 08:54:57 Jeremy [name] ([id number] Page 3 of 3 -------------------------------------------------------------------------------- Vitals Details Patient Name: Date of Service: Jeremy [name], [place] RSHA LL  09/02/2022 8:45 A M Medical Record Number: [id number] Patient Account Number: [id number] Date of Birth/Sex: Treating RN: 03/06/1956 (66 y.o. [name]) Carlene Coria Primary Care Keyasia Jolliff: Tracie Harrier Other Clinician: Referring Dorri Ozturk: Treating Lilyanne Mcquown/Extender: Jeri Cos Self, Referral Weeks in Treatment: 0 Vital Signs Time Taken: 08:55 Temperature (F): 97.6 Height (in): 71 Pulse (bpm): 94 Source: Stated Respiratory Rate (breaths/min): 20 Weight (lbs): 240 Blood Pressure (mmHg): 176/80 Source: Stated Reference Range: 80 - 120 mg / dl Body Mass Index (BMI): 33.5 Electronic Signature(s) Signed: 09/03/2022 11:27:58 AM By: Carlene Coria RN Entered By: Carlene Coria on 09/02/2022 08:55:44

## 2022-09-06 DIAGNOSIS — Z6833 Body mass index (BMI) 33.0-33.9, adult: Secondary | ICD-10-CM | POA: Diagnosis not present

## 2022-09-06 DIAGNOSIS — Z951 Presence of aortocoronary bypass graft: Secondary | ICD-10-CM | POA: Diagnosis not present

## 2022-09-06 DIAGNOSIS — Z720 Tobacco use: Secondary | ICD-10-CM | POA: Diagnosis not present

## 2022-09-06 DIAGNOSIS — I5022 Chronic systolic (congestive) heart failure: Secondary | ICD-10-CM | POA: Diagnosis not present

## 2022-09-06 DIAGNOSIS — N342 Other urethritis: Secondary | ICD-10-CM | POA: Diagnosis not present

## 2022-09-06 DIAGNOSIS — I700 Atherosclerosis of aorta: Secondary | ICD-10-CM | POA: Diagnosis not present

## 2022-09-06 DIAGNOSIS — I480 Paroxysmal atrial fibrillation: Secondary | ICD-10-CM | POA: Diagnosis not present

## 2022-09-06 DIAGNOSIS — K6289 Other specified diseases of anus and rectum: Secondary | ICD-10-CM | POA: Diagnosis not present

## 2022-09-06 DIAGNOSIS — E1165 Type 2 diabetes mellitus with hyperglycemia: Secondary | ICD-10-CM | POA: Diagnosis not present

## 2022-09-17 DIAGNOSIS — Z951 Presence of aortocoronary bypass graft: Secondary | ICD-10-CM | POA: Diagnosis not present

## 2022-09-17 DIAGNOSIS — I4891 Unspecified atrial fibrillation: Secondary | ICD-10-CM | POA: Diagnosis not present

## 2022-09-17 DIAGNOSIS — Z0000 Encounter for general adult medical examination without abnormal findings: Secondary | ICD-10-CM | POA: Diagnosis not present

## 2022-09-17 DIAGNOSIS — E119 Type 2 diabetes mellitus without complications: Secondary | ICD-10-CM | POA: Diagnosis not present

## 2022-09-17 DIAGNOSIS — I5022 Chronic systolic (congestive) heart failure: Secondary | ICD-10-CM | POA: Diagnosis not present

## 2022-09-17 DIAGNOSIS — F17210 Nicotine dependence, cigarettes, uncomplicated: Secondary | ICD-10-CM | POA: Diagnosis not present

## 2022-11-24 ENCOUNTER — Emergency Department: Payer: Medicare HMO

## 2022-11-24 ENCOUNTER — Inpatient Hospital Stay
Admission: EM | Admit: 2022-11-24 | Discharge: 2022-11-26 | DRG: 281 | Disposition: A | Discharge status: Home / Self Care | Payer: Medicare HMO | Attending: Osteopathic Medicine | Admitting: Osteopathic Medicine

## 2022-11-24 ENCOUNTER — Other Ambulatory Visit: Payer: Self-pay

## 2022-11-24 ENCOUNTER — Inpatient Hospital Stay
Admit: 2022-11-24 | Discharge: 2022-11-24 | Disposition: A | Discharge status: Home / Self Care | Payer: Medicare HMO | Attending: Cardiology | Admitting: Cardiology

## 2022-11-24 DIAGNOSIS — F1210 Cannabis abuse, uncomplicated: Secondary | ICD-10-CM | POA: Diagnosis present

## 2022-11-24 DIAGNOSIS — I130 Hypertensive heart and chronic kidney disease with heart failure and stage 1 through stage 4 chronic kidney disease, or unspecified chronic kidney disease: Secondary | ICD-10-CM | POA: Diagnosis present

## 2022-11-24 DIAGNOSIS — I10 Essential (primary) hypertension: Secondary | ICD-10-CM | POA: Diagnosis present

## 2022-11-24 DIAGNOSIS — R053 Chronic cough: Secondary | ICD-10-CM | POA: Diagnosis present

## 2022-11-24 DIAGNOSIS — F17210 Nicotine dependence, cigarettes, uncomplicated: Secondary | ICD-10-CM | POA: Diagnosis present

## 2022-11-24 DIAGNOSIS — I25798 Atherosclerosis of other coronary artery bypass graft(s) with other forms of angina pectoris: Secondary | ICD-10-CM | POA: Diagnosis not present

## 2022-11-24 DIAGNOSIS — Z7901 Long term (current) use of anticoagulants: Secondary | ICD-10-CM | POA: Diagnosis not present

## 2022-11-24 DIAGNOSIS — I214 Non-ST elevation (NSTEMI) myocardial infarction: Principal | ICD-10-CM | POA: Diagnosis present

## 2022-11-24 DIAGNOSIS — F1491 Cocaine use, unspecified, in remission: Secondary | ICD-10-CM | POA: Diagnosis present

## 2022-11-24 DIAGNOSIS — G4733 Obstructive sleep apnea (adult) (pediatric): Secondary | ICD-10-CM | POA: Diagnosis present

## 2022-11-24 DIAGNOSIS — N1831 Chronic kidney disease, stage 3a: Secondary | ICD-10-CM | POA: Diagnosis present

## 2022-11-24 DIAGNOSIS — I5022 Chronic systolic (congestive) heart failure: Secondary | ICD-10-CM | POA: Diagnosis present

## 2022-11-24 DIAGNOSIS — Z6834 Body mass index (BMI) 34.0-34.9, adult: Secondary | ICD-10-CM | POA: Diagnosis not present

## 2022-11-24 DIAGNOSIS — E119 Type 2 diabetes mellitus without complications: Secondary | ICD-10-CM

## 2022-11-24 DIAGNOSIS — I200 Unstable angina: Secondary | ICD-10-CM | POA: Diagnosis not present

## 2022-11-24 DIAGNOSIS — E785 Hyperlipidemia, unspecified: Secondary | ICD-10-CM | POA: Diagnosis present

## 2022-11-24 DIAGNOSIS — E1169 Type 2 diabetes mellitus with other specified complication: Secondary | ICD-10-CM | POA: Diagnosis not present

## 2022-11-24 DIAGNOSIS — Z91199 Patient's noncompliance with other medical treatment and regimen due to unspecified reason: Secondary | ICD-10-CM

## 2022-11-24 DIAGNOSIS — I700 Atherosclerosis of aorta: Secondary | ICD-10-CM | POA: Diagnosis present

## 2022-11-24 DIAGNOSIS — R0789 Other chest pain: Secondary | ICD-10-CM | POA: Diagnosis not present

## 2022-11-24 DIAGNOSIS — Z880 Allergy status to penicillin: Secondary | ICD-10-CM

## 2022-11-24 DIAGNOSIS — Z79899 Other long term (current) drug therapy: Secondary | ICD-10-CM | POA: Diagnosis not present

## 2022-11-24 DIAGNOSIS — R079 Chest pain, unspecified: Secondary | ICD-10-CM | POA: Diagnosis not present

## 2022-11-24 DIAGNOSIS — Z7984 Long term (current) use of oral hypoglycemic drugs: Secondary | ICD-10-CM | POA: Diagnosis not present

## 2022-11-24 DIAGNOSIS — R0902 Hypoxemia: Secondary | ICD-10-CM | POA: Diagnosis not present

## 2022-11-24 DIAGNOSIS — E1122 Type 2 diabetes mellitus with diabetic chronic kidney disease: Secondary | ICD-10-CM | POA: Diagnosis present

## 2022-11-24 DIAGNOSIS — R946 Abnormal results of thyroid function studies: Secondary | ICD-10-CM | POA: Diagnosis present

## 2022-11-24 DIAGNOSIS — F1910 Other psychoactive substance abuse, uncomplicated: Secondary | ICD-10-CM

## 2022-11-24 DIAGNOSIS — I2119 ST elevation (STEMI) myocardial infarction involving other coronary artery of inferior wall: Secondary | ICD-10-CM | POA: Diagnosis not present

## 2022-11-24 DIAGNOSIS — T45516A Underdosing of anticoagulants, initial encounter: Secondary | ICD-10-CM | POA: Diagnosis present

## 2022-11-24 DIAGNOSIS — Z8249 Family history of ischemic heart disease and other diseases of the circulatory system: Secondary | ICD-10-CM

## 2022-11-24 DIAGNOSIS — E669 Obesity, unspecified: Secondary | ICD-10-CM | POA: Diagnosis present

## 2022-11-24 DIAGNOSIS — N183 Chronic kidney disease, stage 3 unspecified: Secondary | ICD-10-CM | POA: Diagnosis not present

## 2022-11-24 DIAGNOSIS — I2510 Atherosclerotic heart disease of native coronary artery without angina pectoris: Secondary | ICD-10-CM | POA: Diagnosis present

## 2022-11-24 DIAGNOSIS — F1291 Cannabis use, unspecified, in remission: Secondary | ICD-10-CM | POA: Diagnosis present

## 2022-11-24 DIAGNOSIS — I5023 Acute on chronic systolic (congestive) heart failure: Secondary | ICD-10-CM | POA: Diagnosis not present

## 2022-11-24 DIAGNOSIS — I480 Paroxysmal atrial fibrillation: Secondary | ICD-10-CM | POA: Diagnosis present

## 2022-11-24 DIAGNOSIS — R000 Tachycardia, unspecified: Secondary | ICD-10-CM | POA: Diagnosis not present

## 2022-11-24 DIAGNOSIS — Z951 Presence of aortocoronary bypass graft: Secondary | ICD-10-CM

## 2022-11-24 DIAGNOSIS — Z720 Tobacco use: Secondary | ICD-10-CM | POA: Diagnosis not present

## 2022-11-24 DIAGNOSIS — F1410 Cocaine abuse, uncomplicated: Secondary | ICD-10-CM | POA: Diagnosis present

## 2022-11-24 DIAGNOSIS — I25709 Atherosclerosis of coronary artery bypass graft(s), unspecified, with unspecified angina pectoris: Secondary | ICD-10-CM | POA: Diagnosis not present

## 2022-11-24 DIAGNOSIS — I959 Hypotension, unspecified: Secondary | ICD-10-CM | POA: Diagnosis not present

## 2022-11-24 DIAGNOSIS — I5020 Unspecified systolic (congestive) heart failure: Secondary | ICD-10-CM | POA: Insufficient documentation

## 2022-11-24 DIAGNOSIS — Z86711 Personal history of pulmonary embolism: Secondary | ICD-10-CM

## 2022-11-24 DIAGNOSIS — I2129 ST elevation (STEMI) myocardial infarction involving other sites: Secondary | ICD-10-CM | POA: Diagnosis present

## 2022-11-24 DIAGNOSIS — F1991 Other psychoactive substance use, unspecified, in remission: Secondary | ICD-10-CM | POA: Diagnosis present

## 2022-11-24 DIAGNOSIS — Z888 Allergy status to other drugs, medicaments and biological substances status: Secondary | ICD-10-CM

## 2022-11-24 HISTORY — DX: Obstructive sleep apnea (adult) (pediatric): G47.33

## 2022-11-24 LAB — BASIC METABOLIC PANEL WITH GFR
Anion gap: 11 (ref 5–15)
BUN: 22 mg/dL (ref 8–23)
CO2: 19 mmol/L — ABNORMAL LOW (ref 22–32)
Calcium: 9 mg/dL (ref 8.9–10.3)
Chloride: 107 mmol/L (ref 98–111)
Creatinine, Ser: 1.5 mg/dL — ABNORMAL HIGH (ref 0.61–1.24)
GFR, Estimated: 51 mL/min — ABNORMAL LOW
Glucose, Bld: 109 mg/dL — ABNORMAL HIGH (ref 70–99)
Potassium: 4.2 mmol/L (ref 3.5–5.1)
Sodium: 137 mmol/L (ref 135–145)

## 2022-11-24 LAB — URINE DRUG SCREEN, QUALITATIVE (ARMC ONLY)
Amphetamines, Ur Screen: NOT DETECTED
Barbiturates, Ur Screen: NOT DETECTED
Benzodiazepine, Ur Scrn: NOT DETECTED
Cannabinoid 50 Ng, Ur ~~LOC~~: POSITIVE — AB
Cocaine Metabolite,Ur ~~LOC~~: NOT DETECTED
MDMA (Ecstasy)Ur Screen: NOT DETECTED
Methadone Scn, Ur: NOT DETECTED
Opiate, Ur Screen: NOT DETECTED
Phencyclidine (PCP) Ur S: NOT DETECTED
Tricyclic, Ur Screen: NOT DETECTED

## 2022-11-24 LAB — CBC
HCT: 46.4 % (ref 39.0–52.0)
Hemoglobin: 15.8 g/dL (ref 13.0–17.0)
MCH: 32.4 pg (ref 26.0–34.0)
MCHC: 34.1 g/dL (ref 30.0–36.0)
MCV: 95.3 fL (ref 80.0–100.0)
Platelets: 253 10*3/uL (ref 150–400)
RBC: 4.87 MIL/uL (ref 4.22–5.81)
RDW: 13.2 % (ref 11.5–15.5)
WBC: 5.8 10*3/uL (ref 4.0–10.5)
nRBC: 0 % (ref 0.0–0.2)

## 2022-11-24 LAB — ECHOCARDIOGRAM COMPLETE

## 2022-11-24 LAB — APTT
aPTT: 42 s — ABNORMAL HIGH (ref 24–36)
aPTT: 113 seconds — ABNORMAL HIGH (ref 24–36)

## 2022-11-24 LAB — CBG MONITORING, ED
Glucose-Capillary: 73 mg/dL (ref 70–99)
Glucose-Capillary: 89 mg/dL (ref 70–99)

## 2022-11-24 LAB — TROPONIN I (HIGH SENSITIVITY)
Troponin I (High Sensitivity): 137 ng/L (ref <18)
Troponin I (High Sensitivity): 141 ng/L (ref <18)

## 2022-11-24 LAB — HEPARIN LEVEL (UNFRACTIONATED): Heparin Unfractionated: >1.1 [IU]/mL — ABNORMAL HIGH (ref 0.30–0.70)

## 2022-11-24 MED ORDER — AMIODARONE HCL 200 MG PO TABS
200.0000 mg | ORAL_TABLET | Freq: Every day | ORAL | Status: DC
  Administered 2022-11-24 – 2022-11-25 (×2): 200 mg via ORAL
  Filled 2022-11-24 (×2): qty 1

## 2022-11-24 MED ORDER — SACUBITRIL-VALSARTAN 49-51 MG PO TABS: 1.0000 | ORAL_TABLET | Freq: Two times a day (BID) | ORAL | Status: DC

## 2022-11-24 MED ORDER — SPIRONOLACTONE 25 MG PO TABS: 25.0000 mg | ORAL_TABLET | Freq: Every day | ORAL | Status: DC

## 2022-11-24 MED ORDER — ASPIRIN 300 MG RE SUPP
300.0000 mg | RECTAL | Status: DC
Start: 2022-11-24 — End: 2022-11-24

## 2022-11-24 MED ORDER — METOPROLOL SUCCINATE ER 50 MG PO TB24: 50.0000 mg | ORAL_TABLET | Freq: Every day | ORAL | Status: DC

## 2022-11-24 MED ORDER — ATORVASTATIN CALCIUM 20 MG PO TABS: 20.0000 mg | ORAL_TABLET | Freq: Every day | ORAL | Status: DC

## 2022-11-24 MED ORDER — METOPROLOL SUCCINATE ER 50 MG PO TB24: 100.0000 mg | ORAL_TABLET | Freq: Every day | ORAL | Status: DC

## 2022-11-24 MED ORDER — ACETAMINOPHEN 325 MG PO TABS
650.0000 mg | ORAL_TABLET | ORAL | Status: DC | PRN
Start: 2022-11-24 — End: 2022-11-24

## 2022-11-24 MED ORDER — ONDANSETRON HCL 4 MG/2ML IJ SOLN
4.0000 mg | Freq: Four times a day (QID) | INTRAMUSCULAR | Status: DC | PRN
Start: 2022-11-24 — End: 2022-11-24

## 2022-11-24 MED ORDER — HEPARIN (PORCINE) 25000 UT/250ML-% IV SOLN
1500.0000 [IU]/h | INTRAVENOUS | Status: AC
  Administered 2022-11-24: 1400 [IU]/h via INTRAVENOUS
  Administered 2022-11-25: 1200 [IU]/h via INTRAVENOUS
  Filled 2022-11-24 (×3): qty 250

## 2022-11-24 MED ORDER — ASPIRIN 81 MG PO TBEC
81.0000 mg | DELAYED_RELEASE_TABLET | Freq: Every day | ORAL | Status: DC
Start: 2022-11-25 — End: 2022-11-24

## 2022-11-24 MED ORDER — LABETALOL HCL 5 MG/ML IV SOLN: 10.0000 mg | INTRAVENOUS | Status: DC | PRN

## 2022-11-24 MED ORDER — ACETAMINOPHEN 325 MG PO TABS
650.0000 mg | ORAL_TABLET | ORAL | Status: DC | PRN
  Administered 2022-11-26: 650 mg via ORAL
  Filled 2022-11-24: qty 2

## 2022-11-24 MED ORDER — ASPIRIN 81 MG PO CHEW: 324.0000 mg | CHEWABLE_TABLET | ORAL | Status: DC

## 2022-11-24 MED ORDER — ATORVASTATIN CALCIUM 20 MG PO TABS
40.0000 mg | ORAL_TABLET | Freq: Every day | ORAL | Status: DC
  Administered 2022-11-25 – 2022-11-26 (×2): 40 mg via ORAL
  Filled 2022-11-24 (×2): qty 2

## 2022-11-24 MED ORDER — SODIUM CHLORIDE 0.9% FLUSH: 3.0000 mL | INTRAVENOUS | Status: DC | PRN

## 2022-11-24 MED ORDER — NICOTINE 14 MG/24HR TD PT24
14.0000 mg | MEDICATED_PATCH | Freq: Every day | TRANSDERMAL | Status: DC
  Administered 2022-11-24 – 2022-11-26 (×3): 14 mg via TRANSDERMAL
  Filled 2022-11-24 (×3): qty 1

## 2022-11-24 MED ORDER — NITROGLYCERIN 0.4 MG SL SUBL
0.4000 mg | SUBLINGUAL_TABLET | SUBLINGUAL | Status: DC | PRN
Start: 2022-11-24 — End: 2022-11-24

## 2022-11-24 MED ORDER — SODIUM CHLORIDE 0.9% FLUSH
3.0000 mL | Freq: Two times a day (BID) | INTRAVENOUS | Status: DC
  Administered 2022-11-25 – 2022-11-26 (×4): 3 mL via INTRAVENOUS

## 2022-11-24 MED ORDER — MORPHINE SULFATE (PF) 4 MG/ML IV SOLN
4.0000 mg | INTRAVENOUS | Status: DC | PRN
  Filled 2022-11-24: qty 1

## 2022-11-24 MED ORDER — LOSARTAN POTASSIUM 50 MG PO TABS: 50.0000 mg | ORAL_TABLET | Freq: Every day | ORAL | Status: DC

## 2022-11-24 MED ORDER — ONDANSETRON HCL 4 MG/2ML IJ SOLN: 4.0000 mg | Freq: Four times a day (QID) | INTRAMUSCULAR | Status: DC | PRN

## 2022-11-24 MED ORDER — ASPIRIN 81 MG PO CHEW
324.0000 mg | CHEWABLE_TABLET | Freq: Once | ORAL | Status: AC
  Administered 2022-11-24: 324 mg via ORAL
  Filled 2022-11-24: qty 4

## 2022-11-24 MED ORDER — LOSARTAN POTASSIUM 50 MG PO TABS
50.0000 mg | ORAL_TABLET | Freq: Every day | ORAL | Status: DC
  Administered 2022-11-25 – 2022-11-26 (×2): 50 mg via ORAL
  Filled 2022-11-24 (×2): qty 1

## 2022-11-24 MED ORDER — SODIUM CHLORIDE 0.9 % IV SOLN: 250.0000 mL | INTRAVENOUS | Status: DC | PRN

## 2022-11-24 MED ORDER — PERFLUTREN LIPID MICROSPHERE
1.0000 mL | INTRAVENOUS | Status: AC | PRN
  Administered 2022-11-24: 2 mL via INTRAVENOUS

## 2022-11-24 MED ORDER — TICAGRELOR 90 MG PO TABS: 90.0000 mg | ORAL_TABLET | Freq: Two times a day (BID) | ORAL | Status: DC

## 2022-11-24 MED ORDER — SODIUM CHLORIDE 0.9 % WEIGHT BASED INFUSION
1.0000 mL/kg/h | INTRAVENOUS | Status: AC
  Administered 2022-11-24: 1 mL/kg/h via INTRAVENOUS

## 2022-11-24 MED ORDER — HYDRALAZINE HCL 20 MG/ML IJ SOLN: 10.0000 mg | INTRAMUSCULAR | Status: DC | PRN

## 2022-11-24 MED ORDER — SACUBITRIL-VALSARTAN 24-26 MG PO TABS: 1.0000 | ORAL_TABLET | Freq: Two times a day (BID) | ORAL | Status: DC

## 2022-11-24 MED ORDER — ASPIRIN 81 MG PO CHEW
81.0000 mg | CHEWABLE_TABLET | Freq: Every day | ORAL | Status: DC
  Administered 2022-11-25 – 2022-11-26 (×2): 81 mg via ORAL
  Filled 2022-11-24 (×2): qty 1

## 2022-11-24 MED ORDER — NITROGLYCERIN 0.4 MG SL SUBL
0.4000 mg | SUBLINGUAL_TABLET | SUBLINGUAL | Status: DC | PRN
  Administered 2022-11-24: 0.4 mg via SUBLINGUAL
  Filled 2022-11-24: qty 1

## 2022-11-24 NOTE — ED Notes (Signed)
Pt given 2 juices and sandwhich box, pt ordered dinner.

## 2022-11-24 NOTE — ED Provider Notes (Signed)
[facility] Provider Note    Event Date/Time   First MD Initiated Contact with Patient 11/24/22 1322     (approximate)   History   Chest Pain   HPI  Jeremy [name] is a 67 y.o. male status post CABG presents to the ER for evaluation of midsternal chest pain radiating to the right jaw started about 2 hours ago while he was watching TV.  Did have some associated diaphoresis.  Patient denies any recent fevers or chills no shortness of breath.  Has been compliant with his medications.     Physical Exam   Triage Vital Signs: ED Triage Vitals  Enc Vitals Group     BP 11/24/22 1308 127/74     Pulse Rate 11/24/22 1308 (!) 102     Resp 11/24/22 1308 18     Temp 11/24/22 1308 97.6 F (36.4 C)     Temp Source 11/24/22 1308 Oral     SpO2 11/24/22 1308 95 %     Weight 11/24/22 1309 250 lb (113.4 kg)     Height --      Head Circumference --      Peak Flow --      Pain Score 11/24/22 1309 5     Pain Loc --      Pain Edu? --      Excl. in GC? --     Most recent vital signs: Vitals:   11/24/22 1308  BP: 127/74  Pulse: (!) 102  Resp: 18  Temp: 97.6 F (36.4 C)  SpO2: 95%     Constitutional: Alert  Eyes: Conjunctivae are normal.  Head: Atraumatic. Nose: No congestion/rhinnorhea. Mouth/Throat: Mucous membranes are moist.   Neck: Painless ROM.  Cardiovascular:   Good peripheral circulation. Respiratory: Normal respiratory effort.  No retractions.  Gastrointestinal: Soft and nontender.  Musculoskeletal:  no deformity Neurologic:  MAE spontaneously. No gross focal neurologic deficits are appreciated.  Skin:  Skin is warm, dry and intact. No rash noted. Psychiatric: Mood and affect are normal. Speech and behavior are normal.    ED Results / Procedures / Treatments   Labs (all labs ordered are listed, but only abnormal results are displayed) Labs Reviewed  BASIC METABOLIC PANEL - Abnormal; Notable for the following components:      Result  Value   CO2 19 (*)    Glucose, Bld 109 (*)    Creatinine, Ser 1.50 (*)    GFR, Estimated 51 (*)    All other components within normal limits  TROPONIN I (HIGH SENSITIVITY) - Abnormal; Notable for the following components:   Troponin I (High Sensitivity) 137 (*)    All other components within normal limits  CBC     EKG  ED ECG REPORT I, Willy Eddy, the attending physician, personally viewed and interpreted this ECG.   Date: 11/24/2022  EKG Time: 13:10  Rate: 110  Rhythm: sinus  Axis: normal  Intervals:normal qt  ST&T Change: diffuse twave inversions, no STEMI    RADIOLOGY Please see ED Course for my review and interpretation.  I personally reviewed all radiographic images ordered to evaluate for the above acute complaints and reviewed radiology reports and findings.  These findings were personally discussed with the patient.  Please see medical record for radiology report.    PROCEDURES:  Critical Care performed: Yes, see critical care procedure note(s)  .Critical Care  Performed by: Willy Eddy, MD Authorized by: Willy Eddy, MD   Critical care provider statement:  Critical care time (minutes):  40   Critical care was necessary to treat or prevent imminent or life-threatening deterioration of the following conditions:  Cardiac failure   Critical care was time spent personally by me on the following activities:  Ordering and performing treatments and interventions, ordering and review of laboratory studies, ordering and review of radiographic studies, pulse oximetry, re-evaluation of patient's condition, review of old charts, obtaining history from patient or surrogate, examination of patient, evaluation of patient's response to treatment, discussions with primary provider, discussions with consultants and development of treatment plan with patient or surrogate    MEDICATIONS ORDERED IN ED: Medications  aspirin chewable tablet 324 mg (has no  administration in time range)  morphine (PF) 4 MG/ML injection 4 mg (has no administration in time range)     IMPRESSION / MDM / ASSESSMENT AND PLAN / ED COURSE  I reviewed the triage vital signs and the nursing notes.                              Differential diagnosis includes, but is not limited to, ACS, pericarditis, esophagitis, boerhaaves, pe, dissection, pna, bronchitis, costochondritis  Patient presenting to the ER for evaluation of symptoms as described above.  Based on symptoms, risk factors and considered above differential, this presenting complaint could reflect a potentially life-threatening illness therefore the patient will be placed on continuous pulse oximetry and telemetry for monitoring.  Laboratory evaluation will be sent to evaluate for the above complaints.  Patient nontoxic-appearing EKG does have some concerning changes.   Clinical Course as of 11/24/22 1413  Wed Nov 24, 2022  1403 Chest x-ray on my review and interpretation without evidence of consolidation or infiltrate.  Troponin is elevated to 137.  Given presentation this is concerning for ACS.  Will heparinize.  Will consult hospitalist.  Will notify cardiology. [PR]    Clinical Course User Index [PR] Willy Eddy, MD     FINAL CLINICAL IMPRESSION(S) / ED DIAGNOSES   Final diagnoses:  Unstable angina (HCC)     Rx / DC Orders   ED Discharge Orders     None        Note:  This document was prepared using Dragon voice recognition software and may include unintentional dictation errors.    Willy Eddy, MD 11/24/22 [phone]

## 2022-11-24 NOTE — ED Triage Notes (Signed)
See prior note

## 2022-11-24 NOTE — H&P (Signed)
History and Physical    [name] ZOX:[id number] DOB: Jun 22, 1955 DOA: 11/24/2022  PCP: Barbette Reichmann, MD Patient coming from: home/prison  Chief Complaint: chest pain  HPI: Jeremy [name] is a 67 y.o. male with medical history significant of CAD, s/p CABG, ICM/HFrEF LVEF 35% on 06/10/2021,, atrial fibrillation, T2DM, HLD, OSA not on CPAP, polysubstance abuse including cocaine and marijuana, tobacco abuse, PE s/p thrombectomy on 12/24/2020, suspected medical noncompliance who presented with chest pain.  Patient reports that he was stressful after alteration with his girlfriend a few days ago.  He was arrested on 11/21/2022 per patient.  He currently wears a leg monitor. He reports that he started to have intermittent chest pain on 11/22/2022.  His chest pain was located to midsternal area, pressure-like pain, mild to moderate pain with no radiation.  No aggravating or alleviating factors. He has recurrent chest pain today about two hours prior to coming to the ED. Denies nausea or diaphoresis.  He has a chronic cough and wheezing from tobacco abuse.  In the ED, no signs showed T97.6, P88, RR 18, BP 127/74 and O2 sats 93%-98 on room air.  Labs showed troponin 137, creatinine 1.5, nonrevealing CBC.  EKG showed TWI in lateral and inferior leads. CXR showed no acute change.  He was given aspirin 324 mg p.o. x 1, nitroglycerin sublingual, morphine IV in the ED.  Cardiologist was consulted by ED provider.  Denies fevers, chills, shortness of breath, palpitations, nausea, vomiting, diarrhea, constipation, abdominal pain, dysuria, urinary frequency and urgency.  Review of Systems: As per HPI otherwise 10 point review of systems negative.  Review of Systems Otherwise negative except as per HPI, including: General: Denies fever, chills, night sweats or unintended weight loss. Resp: Positive for chronic cough, wheezing Cardiac: Denies palpitations, orthopnea, paroxysmal nocturnal dyspnea.Marland Kitchen   Positive for chest pain. GI: Denies abdominal pain, nausea, vomiting, diarrhea or constipation GU: Denies dysuria, frequency, hesitancy or incontinence MS: Denies muscle aches, joint pain or swelling Neuro: Denies headache, neurologic deficits (focal weakness, numbness, tingling), abnormal gait Psych: Denies anxiety, depression, SI/HI/AVH Skin: Denies new rashes or lesions ID: Denies sick contacts, exotic exposures, travel  Past Medical History:  Diagnosis Date   Coronary artery disease    Diabetes mellitus without complication (HCC)    Hypertension    S/P CABG x 1     Past Surgical History:  Procedure Laterality Date   ANKLE ARTHROSCOPY W/ OPEN REPAIR Right    CORONARY ARTERY BYPASS GRAFT     PULMONARY THROMBECTOMY Bilateral 12/24/2020   Procedure: PULMONARY THROMBECTOMY;  Surgeon: Annice Needy, MD;  Location: [facility];  Service: Cardiovascular;  Laterality: Bilateral;    SOCIAL HISTORY:  reports that he has been smoking cigarettes. He has been smoking an average of .5 packs per day. He has quit using smokeless tobacco. He reports current alcohol use of about 3.0 standard drinks of alcohol per week. He reports that he does not currently use drugs after having used the following drugs: Marijuana.  Allergies  Allergen Reactions   Amoxicillin Swelling   Keflex [Cephalexin] Rash    FAMILY HISTORY: Family History  Problem Relation Age of Onset   Heart attack Father      Prior to Admission medications   Medication Sig Start Date End Date Taking? Authorizing Provider  atorvastatin (LIPITOR) 20 MG tablet Take 20 mg by mouth daily. 09/17/22 09/17/23 Yes [provider]  clotrimazole-betamethasone (LOTRISONE) cream Apply 1 Application topically 2 (two) times daily. 09/17/22 09/17/23  Yes [provider]  diclofenac Sodium (VOLTAREN) 1 % GEL Apply 2 g topically 4 (four) times daily. 08/23/22 08/23/23 Yes [provider]  sildenafil (REVATIO) 20 MG tablet  Take 20 mg by mouth daily. 06/14/22  Yes [provider]  amiodarone (PACERONE) 200 MG tablet Take 200 mg by mouth daily.    [provider]  blood glucose meter kit and supplies KIT Dispense based on patient and insurance preference. Use up to four times daily as directed. (FOR ICD-9 250.00, 250.01). 08/21/19   Dhungel, Nishant, MD  doxycycline (VIBRAMYCIN) 100 MG capsule Take 100 mg by mouth 2 (two) times daily. 04/12/22   [provider]  ENTRESTO 49-51 MG Take 1 tablet by mouth 2 (two) times daily. 05/10/21   [name], MD  furosemide (LASIX) 20 MG tablet Take 1 tablet (20 mg total) by mouth once daily. 05/10/21 05/10/22  [name], MD  hydrOXYzine (ATARAX/VISTARIL) 25 MG tablet Take 25 mg by mouth 3 (three) times daily as needed for itching.    [provider]  LINZESS 72 MCG capsule Take 72 mcg by mouth daily as needed.    [provider]  losartan (COZAAR) 50 MG tablet Take 1 tablet (50 mg total) by mouth daily. 05/11/22   [name], MD  metFORMIN (GLUCOPHAGE) 1000 MG tablet Take 1 tablet (1,000 mg total) by mouth once daily with breakfast. 05/10/21   [name], MD  metoprolol succinate (TOPROL-XL) 100 MG 24 hr tablet Take 2 tablets (200 mg total) by mouth once daily. Take with or immediately following a meal. 05/10/21   [name], MD  Rivaroxaban (XARELTO) 15 MG TABS tablet Take 1 tablet (15 mg total) by mouth 2 (two) times daily with a meal for 20 days. 01/03/21 01/23/21  Wouk, Wilfred Curtis, MD  rivaroxaban (XARELTO) 20 MG TABS tablet Take 1 tablet (20 mg total) by mouth once daily with supper. 05/10/21   [name], MD  spironolactone (ALDACTONE) 25 MG tablet Take 1 tablet (25 mg total) by mouth once daily. 05/10/21   [name], MD  tadalafil (CIALIS) 20 MG tablet Take 20 mg by mouth daily as needed for erectile dysfunction.    [provider]  Torsemide 40 MG TABS Take by mouth.  01/07/21 01/07/22  [provider]    Physical Exam: Vitals:   11/24/22 1400 11/24/22 1500 11/24/22 1530 11/24/22 1540  BP: 119/62 121/74 (!) 92/58 102/62  Pulse: 86 94 93 99  Resp:      Temp:      TempSrc:      SpO2: 93% 98% 93% 100%  Weight:      Height: 5\' 11"  (1.803 m)         Constitutional: NAD, calm, comfortable.  Acute ill-appearing. Eyes: PERRL, lids and conjunctivae normal ENMT: Mucous membranes are moist. Posterior pharynx clear of any exudate or lesions.Normal dentition.  Neck: normal, supple, no masses, no thyromegaly Respiratory: clear to auscultation bilaterally, no wheezing, no crackles. Normal respiratory effort. No accessory muscle use.  Cardiovascular: Regular rate and rhythm, no murmurs / rubs / gallops. No extremity edema. 2+ pedal pulses. No carotid bruits.  Abdomen: no tenderness, no masses palpated. No hepatosplenomegaly. Bowel sounds positive.  Musculoskeletal: no clubbing / cyanosis. No joint deformity upper and lower extremities. Good ROM, no contractures. Normal muscle tone.  Skin: no rashes, lesions, ulcers. No induration Neurologic: CN 2-12 grossly intact. Sensation intact, DTR normal. Strength 5/5 in all 4.  Psychiatric: Normal judgment and insight.  Alert and oriented x 3. Normal mood.     Labs on Admission: I have personally reviewed following labs and imaging studies  CBC: Recent Labs  Lab 11/24/22 1312  WBC 5.8  HGB 15.8  HCT 46.4  MCV 95.3  PLT 253   Basic Metabolic Panel: Recent Labs  Lab 11/24/22 1312  Chatara Lucente 137  K 4.2  CL 107  CO2 19*  GLUCOSE 109*  BUN 22  CREATININE 1.50*  CALCIUM 9.0   GFR: Estimated Creatinine Clearance: 62.8 mL/min (A) (by C-G formula based on SCr of 1.5 mg/dL (H)). Liver Function Tests: No results for input(s): "AST", "ALT", "ALKPHOS", "BILITOT", "PROT", "ALBUMIN" in the last 168 hours. No results for input(s): "LIPASE", "AMYLASE" in the last 168 hours. No results for input(s): "AMMONIA" in  the last 168 hours. Coagulation Profile: No results for input(s): "INR", "PROTIME" in the last 168 hours. Cardiac Enzymes: No results for input(s): "CKTOTAL", "CKMB", "CKMBINDEX", "TROPONINI" in the last 168 hours. BNP (last 3 results) No results for input(s): "PROBNP" in the last 8760 hours. HbA1C: No results for input(s): "HGBA1C" in the last 72 hours. CBG: No results for input(s): "GLUCAP" in the last 168 hours. Lipid Profile: No results for input(s): "CHOL", "HDL", "LDLCALC", "TRIG", "CHOLHDL", "LDLDIRECT" in the last 72 hours. Thyroid Function Tests: No results for input(s): "TSH", "T4TOTAL", "FREET4", "T3FREE", "THYROIDAB" in the last 72 hours. Anemia Panel: No results for input(s): "VITAMINB12", "FOLATE", "FERRITIN", "TIBC", "IRON", "RETICCTPCT" in the last 72 hours. Urine analysis:    Component Value Date/Time   COLORURINE YELLOW (A) 12/26/2020 2218   APPEARANCEUR CLOUDY (A) 12/26/2020 2218   LABSPEC 1.017 12/26/2020 2218   PHURINE 5.0 12/26/2020 2218   GLUCOSEU NEGATIVE 12/26/2020 2218   HGBUR NEGATIVE 12/26/2020 2218   BILIRUBINUR NEGATIVE 12/26/2020 2218   KETONESUR NEGATIVE 12/26/2020 2218   PROTEINUR NEGATIVE 12/26/2020 2218   NITRITE NEGATIVE 12/26/2020 2218   LEUKOCYTESUR TRACE (A) 12/26/2020 2218   Sepsis Labs: !!!!!!!!!!!!!!!!!!!!!!!!!!!!!!!!!!!!!!!!!!!! @LABRCNTIP (procalcitonin:4,lacticidven:4) )No results found for this or any previous visit (from the past 240 hour(s)).   Radiological Exams on Admission: DG Chest Port 1 View  Result Date: 11/24/2022 CLINICAL DATA:  Chest pain. EXAM: PORTABLE CHEST 1 VIEW COMPARISON:  05/11/2022 and CT chest 12/22/2020. FINDINGS: Trachea is midline. Heart size stable. Defibrillator pad projects over the medial left chest. Lungs are clear. No pleural fluid. IMPRESSION: No acute findings. Electronically Signed   By: Leanna Battles M.D.   On: 11/24/2022 14:10     All images have been reviewed by me personally.  EKG:  Independently reviewed.   Assessment/Plan Principal Problem:   NSTEMI (non-ST elevated myocardial infarction) (HCC) Active Problems:   History of cocaine use   History of marijuana use   History of illicit drug use   Essential hypertension   Medical non-compliance   Tobacco use   Diabetes mellitus, type 2 (HCC)   Aortic atherosclerosis (HCC)   Obesity (BMI 30-39.9)   S/P CABG (coronary artery bypass graft)   Stage 3a chronic kidney disease (HCC)   Acute ST elevation myocardial infarction (STEMI) of posterior wall (HCC)   HFrEF (heart failure with reduced ejection fraction) (HCC)   PAF (paroxysmal atrial fibrillation) (HCC)   OSA (obstructive sleep apnea)     Assessment and Plan: No notes have been filed under this hospital service. Service: Hospitalist    Assessment Plan  # NSTEMI # History of CAD # History of CABG # History of ICM/HFrEF LVEF 35% on 06/02/2021  Patient presented with  chest pain.  Workup suggested elevated troponin and EKG changes with TWI in inferior and lateral leads, which is new compared to his old EKGs  -Admit to cardiac telemetry -O2 as needed -Trending troponins -Hemoglobin A1c and TSH -Follow EKGs -Nitroglycerin sublingual as needed -Morphine IV as needed -N.p.o. for now pending cardiology consult.  ED provider spoke to cardiology on-call MD -Heparin drip - update Echo pending  # PAF on Xarelto # medical noncompliance # History of PE s/p thrombectomy on 12/24/2020   -EKG showing NSR now -Unclear when was the last dose of Xarelto  # T2DM # HLD # CKD III   -Last Hemoglobin A1c 8.7 in 2022 -Glucose ACHS -Hemoglobin A1c is pending -Creatinine range from 1-1.5 in the past, creatinine 1.5 this admission and monitor BMP   # Tobacco abuse  -Tobacco 0.5 pack a day -Tobacco cessation education done -Nicotine patch  #Polysubstance abuse including cocaine and marijuana  -He admits recent use marijuana and states that he stopped  cocaine -Polysubstance abuse cessation education done -UDS is pending  # Obesity with a BMI 34.87 -Weight loss per PCP as outpatient   # OSA not on CPAP - noted     DVT prophylaxis: Heparin drip Code Status: Full code Family Communication: None present Consults called: Cardiology called by ED provider Admission status: Observation:  Status is: Observation The patient remains OBS appropriate and will d/c before 2 midnights.   Time Spent: 65 minutes.  >50% of the time was devoted to discussing the patients care, assessment, plan and disposition with other care givers along with counseling the patient about the risks and benefits of treatment.    Dede Query MD [facility]  If 7PM-7AM, please contact night-coverage   11/24/2022, 3:54 PM

## 2022-11-24 NOTE — ED Triage Notes (Signed)
Pt to er via ems, per ems pt is here for chest pain, states that the pain started about an hour ago, states that it feels like someone is punching him in the chest, states that they gave 324 aspirin,

## 2022-11-24 NOTE — ED Notes (Signed)
PT given 2 Orange Juice
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
## 2022-11-24 NOTE — Consult Note (Signed)
ANTICOAGULATION CONSULT NOTE - Initial Consult  Pharmacy Consult for heparin infusion Indication: chest pain/ACS, hx of afib/flutter on Xarelto PTA  Allergies  Allergen Reactions   Amoxicillin Swelling   Keflex [Cephalexin] Rash    Patient Measurements: Height: 5\' 11"  (180.3 cm) Weight: 113.4 kg (250 lb) IBW/kg (Calculated) : 75.3 Heparin Dosing Weight: 99.9 kg   Vital Signs: Temp: 97.6 F (36.4 C) (06/12 1308) Temp Source: Oral (06/12 1308) BP: 127/74 (06/12 1308) Pulse Rate: 102 (06/12 1308)  Labs: Recent Labs    11/24/22 1312  HGB 15.8  HCT 46.4  PLT 253  CREATININE 1.50*  TROPONINIHS 137*    Estimated Creatinine Clearance: 62.8 mL/min (A) (by C-G formula based on SCr of 1.5 mg/dL (H)).   Medical History: Past Medical History:  Diagnosis Date   Coronary artery disease    Diabetes mellitus without complication (HCC)    Hypertension    S/P CABG x 1     Medications:  PTA: xarelto 20 mg daily: last dose 6/11  Assessment: 66yoM with a PMH of CAD s/p CABG, HFrEF (35%), atrial fibrillation/flutter on rivaroxaban PTA, presented to ED 11/24/22 with midsternal chest pain radiating to the right jaw , diaphoresis that started about 2 hours ago while he was watching TV. Troponin I elevated at 137. Pharmacy has been consulted to initiate and manage IV heparin therapy for ACS.  Baseline: HL > 1.10, aPTT 42, Hgb 15.8 Plt 253    Goal of Therapy:  Heparin level 0.3-0.7 units/ml aPTT 66-102 seconds Monitor platelets by anticoagulation protocol: Yes   Plan:  Start heparin infusion at 1400 units/hr Check aPTT level in 6 hours given recent Xarelto exposure Transition to HL once therapeutic and correlating with aPTT Continue to monitor H&H and platelets  Sharen Hones, PharmD, BCPS Clinical Pharmacist   11/24/2022,2:37 PM

## 2022-11-25 ENCOUNTER — Other Ambulatory Visit: Payer: Self-pay

## 2022-11-25 DIAGNOSIS — I214 Non-ST elevation (NSTEMI) myocardial infarction: Secondary | ICD-10-CM | POA: Diagnosis not present

## 2022-11-25 LAB — CBC
HCT: 40.7 % (ref 39.0–52.0)
Hemoglobin: 13.8 g/dL (ref 13.0–17.0)
MCH: 32.6 pg (ref 26.0–34.0)
MCHC: 33.9 g/dL (ref 30.0–36.0)
MCV: 96.2 fL (ref 80.0–100.0)
Platelets: 210 10*3/uL (ref 150–400)
RBC: 4.23 MIL/uL (ref 4.22–5.81)
RDW: 13.4 % (ref 11.5–15.5)
WBC: 5.9 10*3/uL (ref 4.0–10.5)
nRBC: 0 % (ref 0.0–0.2)

## 2022-11-25 LAB — APTT
aPTT: 112 seconds — ABNORMAL HIGH (ref 24–36)
aPTT: >200 seconds (ref 24–36)
aPTT: 75 seconds — ABNORMAL HIGH (ref 24–36)

## 2022-11-25 LAB — CBG MONITORING, ED
Glucose-Capillary: 101 mg/dL — ABNORMAL HIGH (ref 70–99)
Glucose-Capillary: 93 mg/dL (ref 70–99)

## 2022-11-25 LAB — LIPID PANEL
Cholesterol: 88 mg/dL (ref 0–200)
HDL: 31 mg/dL — ABNORMAL LOW (ref >40)
LDL Cholesterol: 44 mg/dL (ref 0–99)
Total CHOL/HDL Ratio: 2.8 RATIO
Triglycerides: 67 mg/dL (ref <150)
VLDL: 13 mg/dL (ref 0–40)

## 2022-11-25 LAB — BASIC METABOLIC PANEL
Anion gap: 7 (ref 5–15)
BUN: 21 mg/dL (ref 8–23)
CO2: 22 mmol/L (ref 22–32)
Calcium: 9 mg/dL (ref 8.9–10.3)
Chloride: 109 mmol/L (ref 98–111)
Creatinine, Ser: 1.2 mg/dL (ref 0.61–1.24)
GFR, Estimated: >60 mL/min (ref >60)
Glucose, Bld: 108 mg/dL — ABNORMAL HIGH (ref 70–99)
Potassium: 3.9 mmol/L (ref 3.5–5.1)
Sodium: 138 mmol/L (ref 135–145)

## 2022-11-25 LAB — HEPARIN LEVEL (UNFRACTIONATED): Heparin Unfractionated: >1.1 IU/mL — ABNORMAL HIGH (ref 0.30–0.70)

## 2022-11-25 LAB — HEMOGLOBIN A1C
Hgb A1c MFr Bld: 6 % — ABNORMAL HIGH (ref 4.8–5.6)
Mean Plasma Glucose: 125.5 mg/dL

## 2022-11-25 LAB — T4, FREE: Free T4: 1.38 ng/dL — ABNORMAL HIGH (ref 0.61–1.12)

## 2022-11-25 LAB — TSH: TSH: 0.125 u[IU]/mL — ABNORMAL LOW (ref 0.350–4.500)

## 2022-11-25 MED ORDER — PANTOPRAZOLE SODIUM 20 MG PO TBEC
20.0000 mg | DELAYED_RELEASE_TABLET | Freq: Every day | ORAL | Status: DC
  Administered 2022-11-25 – 2022-11-26 (×2): 20 mg via ORAL
  Filled 2022-11-25 (×2): qty 1

## 2022-11-25 MED ORDER — ISOSORBIDE MONONITRATE ER 60 MG PO TB24
30.0000 mg | ORAL_TABLET | Freq: Every day | ORAL | Status: DC
  Administered 2022-11-25 – 2022-11-26 (×2): 30 mg via ORAL
  Filled 2022-11-25 (×2): qty 1
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
## 2022-11-25 NOTE — Consult Note (Addendum)
ANTICOAGULATION CONSULT NOTE  Pharmacy Consult for heparin infusion Indication: chest pain/ACS, hx of afib/flutter on Xarelto PTA  Allergies  Allergen Reactions   Amoxicillin Swelling   Keflex [Cephalexin] Rash    Patient Measurements: Height: 5\' 11"  (180.3 cm) Weight: 113.4 kg (250 lb) IBW/kg (Calculated) : 75.3 Heparin Dosing Weight: 99.9 kg   Vital Signs: Temp: 98.6 F (37 C) (06/13 0500) Temp Source: Oral (06/13 0021) BP: 117/63 (06/13 0520) Pulse Rate: 83 (06/13 0520)  Labs: Recent Labs    11/24/22 1312 11/24/22 1427 11/24/22 1521 11/24/22 2252 11/25/22 0523  HGB 15.8  --   --   --  13.8  HCT 46.4  --   --   --  40.7  PLT 253  --   --   --  210  APTT  --  42*  --  113* 112*  HEPARINUNFRC  --  >1.10*  --   --  >1.10*  CREATININE 1.50*  --   --   --  1.20  TROPONINIHS 137*  --  141*  --   --      Estimated Creatinine Clearance: 78.6 mL/min (by C-G formula based on SCr of 1.2 mg/dL).   Medical History: Past Medical History:  Diagnosis Date   Coronary artery disease    Diabetes mellitus without complication (HCC)    Hypertension    S/P CABG x 1     Medications:  PTA: xarelto 20 mg daily: last dose 6/11  Assessment: 66yoM with a PMH of CAD s/p CABG, HFrEF (35%), atrial fibrillation/flutter on rivaroxaban PTA, presented to ED 11/24/22 with midsternal chest pain radiating to the right jaw , diaphoresis that started about 2 hours ago while he was watching TV. Troponin I elevated at 137. Pharmacy has been consulted to initiate and manage IV heparin therapy for ACS.  Baseline: HL > 1.10, aPTT 42, Hgb 15.8 Plt 253    Goal of Therapy:  Heparin level 0.3-0.7 units/ml aPTT 66-102 seconds Monitor platelets by anticoagulation protocol: Yes   06/12 2252 aPTT 113, supratherapeutic 06/13 0523 aPTT 112, supratherapeutic / HL >1.10 not correlating  Plan:  Decrease heparin infusion to 1200 units/hr Recheck aPTT level in 6 hours given recent Xarelto  exposure Transition to HL once therapeutic and correlating with aPTT Continue to monitor H&H and platelets  Otelia Sergeant, PharmD, [facility] 11/25/2022 6:26 AM

## 2022-11-25 NOTE — Consult Note (Addendum)
ANTICOAGULATION CONSULT NOTE  Pharmacy Consult for heparin infusion Indication: chest pain/ACS, hx of afib/flutter on Xarelto PTA  Allergies  Allergen Reactions   Amoxicillin Swelling   Keflex [Cephalexin] Rash    Patient Measurements: Height: 5\' 11"  (180.3 cm) Weight: 113.4 kg (250 lb) IBW/kg (Calculated) : 75.3 Heparin Dosing Weight: 99.9 kg   Vital Signs: Temp: 98.1 F (36.7 C) (06/13 1200) Temp Source: Oral (06/13 1200) BP: 132/91 (06/13 1400) Pulse Rate: 91 (06/13 1400)  Labs: Recent Labs    11/24/22 1312 11/24/22 1427 11/24/22 1427 11/24/22 1521 11/24/22 2252 11/25/22 0523 11/25/22 1342  HGB 15.8  --   --   --   --  13.8  --   HCT 46.4  --   --   --   --  40.7  --   PLT 253  --   --   --   --  210  --   APTT  --  42*   < >  --  113* 112* >200*  HEPARINUNFRC  --  >1.10*  --   --   --  >1.10*  --   CREATININE 1.50*  --   --   --   --  1.20  --   TROPONINIHS 137*  --   --  141*  --   --   --    < > = values in this interval not displayed.    Estimated Creatinine Clearance: 78.6 mL/min (by C-G formula based on SCr of 1.2 mg/dL).   Medical History: Past Medical History:  Diagnosis Date   Coronary artery disease    Diabetes mellitus without complication (HCC)    Hypertension    S/P CABG x 1     Medications:  PTA: xarelto 20 mg daily: last dose 6/11  Assessment: 66yoM with a PMH of CAD s/p CABG, HFrEF (35%), atrial fibrillation/flutter on rivaroxaban PTA, presented to ED 11/24/22 with midsternal chest pain radiating to the right jaw, diaphoresis that started while he was watching TV. Troponin I elevated at 137. Pharmacy has been consulted to initiate and manage IV heparin therapy for ACS. No acute findings on chest x-ray. Echo in process.  Baseline: HL > 1.10, aPTT 42, Hgb 15.8 Plt 253    Goal of Therapy:  Heparin level 0.3-0.7 units/ml aPTT 66-102 seconds Monitor platelets by anticoagulation protocol: Yes   06/12 2252 aPTT 113,  supratherapeutic 06/13 0523 aPTT 112, supratherapeutic / HL >1.10 not correlating 06/13 1342 aPTT >200, supratherapeutic, suspect error  06/13 1614 aPTT 75, therapeutic x 1  Plan:  Continue heparin infusion at 1200 units/hr Recheck aPPT in 6 hours  Transition to HL once therapeutic and correlating with aPTT Continue to monitor H&H and platelets Daily CBC per protocol  Francetta Found, PharmD Candidate Class of 2025  11/25/2022 3:35 PM

## 2022-11-25 NOTE — ED Notes (Signed)
While entering the room this RN stated "I have some medications for you" Patient replied "Is it to make me horny?" Patient educated on appropriate/inappropriate behavior. Patient reports understanding.
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
## 2022-11-25 NOTE — Progress Notes (Addendum)
PROGRESS NOTE    [name]   ZOX:[id number] DOB: 04-Jun-1956  DOA: 11/24/2022 Date of Service: 11/25/22 PCP: Barbette Reichmann, MD     Brief Narrative / Hospital Course:  Jeremy [name] is a 67 y.o. male with medical history significant of CAD, s/p CABG, ICM/HFrEF LVEF 35% on 06/10/2021,, atrial fibrillation, T2DM, HLD, OSA not on CPAP, polysubstance abuse including cocaine and marijuana, tobacco abuse, PE s/p thrombectomy on 12/24/2020, suspected medical noncompliance who presented to ED 11/24/2022 with chest pain starting 2 days prior. Of note, he was recently arrested and has LLE monitor in place.  06/12: ED, T97.6, P88, RR 18, BP 127/74 and O2 sats 93%-98 on room air.  Labs showed troponin 137, creatinine 1.5, nonrevealing CBC.  EKG showed TWI in lateral and inferior leads. CXR showed no acute change.  He was given aspirin 324 mg p.o. x 1, nitroglycerin sublingual, morphine IV in the ED.  Cardiologist was consulted by ED provider. Advised heparin gtt x48 hours, deferring invasive procedures for now given flat troponins  06/13: TSH low (chronic since 02/2022), T4 slightly up. Echo pending. Cardiology following.   Consultants:  Cardiology   Procedures: None       ASSESSMENT & PLAN:   Principal Problem:   NSTEMI (non-ST elevated myocardial infarction) (HCC) Active Problems:   History of cocaine use   History of marijuana use   History of illicit drug use   Essential hypertension   Medically noncompliant   Tobacco abuse   Diabetes mellitus, type 2 (HCC)   Aortic atherosclerosis (HCC)   Obesity (BMI 30-39.9)   Hx of CABG   Stage 3 chronic kidney disease (HCC)   Acute ST elevation myocardial infarction (STEMI) of posterior wall (HCC)   HFrEF (heart failure with reduced ejection fraction) (HCC)   PAF (paroxysmal atrial fibrillation) (HCC)   OSA (obstructive sleep apnea)   Coronary artery disease involving native coronary artery of native heart   Polysubstance abuse  (HCC)   Hyperlipidemia  NSTEMI CAD History of CABG History of ICM/HFrEF LVEF 35% on 06/02/2021 HLD Aortic atherosclerosis  EKG changes with TWI in inferior and lateral leads, which is new compared to his old EKGs  Heparin gtt Follow EKG and troponin Morphine, nitro prn  Cardiology following  Echo pending  ASA 81 Increase potency statin ACE/ARB as BP allows GDMT for CHF w/ SGLT2, spironolactone as BP and renal fxn allows No plans for invasive testing at this time  Lipids, A1C  PAF now in NSR  medical noncompliance w/ Xarelto/amiodarone unclear when last dose  History of PE s/p thrombectomy on 12/24/2020  Telemetry Holding Xarelto for now on heparin  Abnormal thyroid function test Low TSH >6 mos Elevated T4, mild Ab testing for Graves/Hashimotos Follow outpatient   T2DM A1C  CKD III Montior BMP  Polysubstance abuse including cocaine and marijuana  UDS (+)cannabinoid Tobacco use Advise abstain recreational drugs and tobacco   Obesity Body mass index is 34.87 kg/m. Weight loss as able outpatient   OSA  Not on CPAP Follow outpatient    DVT prophylaxis: on heparin as above  Pertinent IV fluids/nutrition: no continuous IV fluids  Central lines / invasive devices: none  Code Status: FULL CODE ACP documentation reviewed: 11/25/22 none on file   Current Admission Status: inpatient  TOC needs / Dispo plan: TBD but expect home when stable  Barriers to discharge / significant pending items: heparin gtt 48h total, Echo, cardiology following, anticipate d/c in a few days  Subjective / Brief ROS:  Patient reports feeling better today Denies CP/SOB at this time  Pain controlled.  Denies new weakness.  Tolerating diet.  Reports no concerns w/ urination/defecation.   Family Communication: none at this time    Objective Findings:  Vitals:   11/25/22 1134 11/25/22 1200 11/25/22 1300 11/25/22 1400  BP: 120/73 125/64 128/69 (!) 132/91   Pulse: 65 79 87 91  Resp: 18  18   Temp:  98.1 F (36.7 C)    TempSrc:  Oral    SpO2: 98% 99% 95% 95%  Weight:      Height:        Intake/Output Summary (Last 24 hours) at 11/25/2022 1522 Last data filed at 11/25/2022 1055 Gross per 24 hour  Intake 1000 ml  Output 350 ml  Net 650 ml   Filed Weights   11/24/22 1309  Weight: 113.4 kg    Examination:  Physical Exam Constitutional:      General: He is not in acute distress.    Appearance: He is not ill-appearing.  Cardiovascular:     Rate and Rhythm: Normal rate and regular rhythm.  Pulmonary:     Breath sounds: Normal breath sounds.  Musculoskeletal:     Right lower leg: No edema.     Left lower leg: No edema.  Skin:    General: Skin is warm and dry.  Neurological:     Mental Status: He is alert.          Scheduled Medications:   aspirin  81 mg Oral Daily   atorvastatin  40 mg Oral Daily   losartan  50 mg Oral Daily   nicotine  14 mg Transdermal Daily   sodium chloride flush  3 mL Intravenous Q12H    Continuous Infusions:  sodium chloride     heparin 1,200 Units/hr (11/25/22 0642)    PRN Medications:  sodium chloride, acetaminophen, morphine injection, nitroGLYCERIN, ondansetron (ZOFRAN) IV, sodium chloride flush  Antimicrobials from admission:  Anti-infectives (From admission, onward)    None           Data Reviewed:  I have personally reviewed the following...  CBC: Recent Labs  Lab 11/24/22 1312 11/25/22 0523  WBC 5.8 5.9  HGB 15.8 13.8  HCT 46.4 40.7  MCV 95.3 96.2  PLT 253 210   Basic Metabolic Panel: Recent Labs  Lab 11/24/22 1312 11/25/22 0523  NA 137 138  K 4.2 3.9  CL 107 109  CO2 19* 22  GLUCOSE 109* 108*  BUN 22 21  CREATININE 1.50* 1.20  CALCIUM 9.0 9.0   GFR: Estimated Creatinine Clearance: 78.6 mL/min (by C-G formula based on SCr of 1.2 mg/dL). Liver Function Tests: No results for input(s): "AST", "ALT", "ALKPHOS", "BILITOT", "PROT", "ALBUMIN" in the  last 168 hours. No results for input(s): "LIPASE", "AMYLASE" in the last 168 hours. No results for input(s): "AMMONIA" in the last 168 hours. Coagulation Profile: No results for input(s): "INR", "PROTIME" in the last 168 hours. Cardiac Enzymes: No results for input(s): "CKTOTAL", "CKMB", "CKMBINDEX", "TROPONINI" in the last 168 hours. BNP (last 3 results) No results for input(s): "PROBNP" in the last 8760 hours. HbA1C: Recent Labs    11/25/22 0523  HGBA1C 6.0*   CBG: Recent Labs  Lab 11/24/22 1738 11/24/22 2216 11/25/22 0747  GLUCAP 73 89 101*   Lipid Profile: Recent Labs    11/25/22 0523  CHOL 88  HDL 31*  LDLCALC 44  TRIG 67  CHOLHDL 2.8  Thyroid Function Tests: Recent Labs    11/25/22 0523  TSH 0.125*  FREET4 1.38*   Anemia Panel: No results for input(s): "VITAMINB12", "FOLATE", "FERRITIN", "TIBC", "IRON", "RETICCTPCT" in the last 72 hours. Most Recent Urinalysis On File:     Component Value Date/Time   COLORURINE YELLOW (A) 12/26/2020 2218   APPEARANCEUR CLOUDY (A) 12/26/2020 2218   LABSPEC 1.017 12/26/2020 2218   PHURINE 5.0 12/26/2020 2218   GLUCOSEU NEGATIVE 12/26/2020 2218   HGBUR NEGATIVE 12/26/2020 2218   BILIRUBINUR NEGATIVE 12/26/2020 2218   KETONESUR NEGATIVE 12/26/2020 2218   PROTEINUR NEGATIVE 12/26/2020 2218   NITRITE NEGATIVE 12/26/2020 2218   LEUKOCYTESUR TRACE (A) 12/26/2020 2218   Sepsis Labs: @LABRCNTIP (procalcitonin:4,lacticidven:4) Microbiology: No results found for this or any previous visit (from the past 240 hour(s)).    Radiology Studies last 3 days: DG Chest Port 1 View  Result Date: 11/24/2022 CLINICAL DATA:  Chest pain. EXAM: PORTABLE CHEST 1 VIEW COMPARISON:  05/11/2022 and CT chest 12/22/2020. FINDINGS: Trachea is midline. Heart size stable. Defibrillator pad projects over the medial left chest. Lungs are clear. No pleural fluid. IMPRESSION: No acute findings. Electronically Signed   By: Leanna Battles M.D.   On:  11/24/2022 14:10             LOS: 1 day        Sunnie Nielsen, DO [facility] 11/25/2022, 3:22 PM    Dictation software may have been used to generate the above note. Typos may occur and escape review in typed/dictated notes. Please contact Dr Lyn Hollingshead directly for clarity if needed.  Staff may message me via secure chat in Epic  but this may not receive an immediate response,  please page me for urgent matters!  If 7PM-7AM, please contact night coverage [email or web address]

## 2022-11-25 NOTE — Consult Note (Signed)
ANTICOAGULATION CONSULT NOTE  Pharmacy Consult for heparin infusion Indication: chest pain/ACS, hx of afib/flutter on Xarelto PTA  Allergies  Allergen Reactions   Amoxicillin Swelling   Keflex [Cephalexin] Rash    Patient Measurements: Height: 5\' 11"  (180.3 cm) Weight: 113.4 kg (250 lb) IBW/kg (Calculated) : 75.3 Heparin Dosing Weight: 99.9 kg   Vital Signs: Temp: 97.6 F (36.4 C) (06/12 1308) Temp Source: Oral (06/12 1308) BP: 100/60 (06/12 2117) Pulse Rate: 89 (06/12 2117)  Labs: Recent Labs    11/24/22 1312 11/24/22 1427 11/24/22 1521 11/24/22 2252  HGB 15.8  --   --   --   HCT 46.4  --   --   --   PLT 253  --   --   --   APTT  --  42*  --  113*  HEPARINUNFRC  --  >1.10*  --   --   CREATININE 1.50*  --   --   --   TROPONINIHS 137*  --  141*  --      Estimated Creatinine Clearance: 62.8 mL/min (A) (by C-G formula based on SCr of 1.5 mg/dL (H)).   Medical History: Past Medical History:  Diagnosis Date   Coronary artery disease    Diabetes mellitus without complication (HCC)    Hypertension    S/P CABG x 1     Medications:  PTA: xarelto 20 mg daily: last dose 6/11  Assessment: 66yoM with a PMH of CAD s/p CABG, HFrEF (35%), atrial fibrillation/flutter on rivaroxaban PTA, presented to ED 11/24/22 with midsternal chest pain radiating to the right jaw , diaphoresis that started about 2 hours ago while he was watching TV. Troponin I elevated at 137. Pharmacy has been consulted to initiate and manage IV heparin therapy for ACS.  Baseline: HL > 1.10, aPTT 42, Hgb 15.8 Plt 253    Goal of Therapy:  Heparin level 0.3-0.7 units/ml aPTT 66-102 seconds Monitor platelets by anticoagulation protocol: Yes   06/12 2252 aPTT 113, supratherapeutic  Plan:  Decrease heparin infusion to 1300 units/hr Recheck aPTT level in 6 hours given recent Xarelto exposure Transition to HL once therapeutic and correlating with aPTT Continue to monitor H&H and platelets  Otelia Sergeant, PharmD, [facility] 11/25/2022 12:07 AM

## 2022-11-26 ENCOUNTER — Other Ambulatory Visit: Payer: Self-pay

## 2022-11-26 DIAGNOSIS — I214 Non-ST elevation (NSTEMI) myocardial infarction: Secondary | ICD-10-CM | POA: Diagnosis not present

## 2022-11-26 LAB — ECHOCARDIOGRAM COMPLETE
Area-P 1/2: 5.27 cm2
Height: 71 in
S' Lateral: 3.8 cm
Weight: 4000 oz

## 2022-11-26 LAB — CBC
HCT: 39.2 % (ref 39.0–52.0)
Hemoglobin: 13.2 g/dL (ref 13.0–17.0)
MCH: 32.6 pg (ref 26.0–34.0)
MCHC: 33.7 g/dL (ref 30.0–36.0)
MCV: 96.8 fL (ref 80.0–100.0)
Platelets: 228 10*3/uL (ref 150–400)
RBC: 4.05 MIL/uL — ABNORMAL LOW (ref 4.22–5.81)
RDW: 13.2 % (ref 11.5–15.5)
WBC: 5.3 10*3/uL (ref 4.0–10.5)
nRBC: 0 % (ref 0.0–0.2)

## 2022-11-26 LAB — CBG MONITORING, ED
Glucose-Capillary: 96 mg/dL (ref 70–99)
Glucose-Capillary: 130 mg/dL — ABNORMAL HIGH (ref 70–99)

## 2022-11-26 LAB — APTT
aPTT: 31 seconds (ref 24–36)
aPTT: 66 seconds — ABNORMAL HIGH (ref 24–36)

## 2022-11-26 LAB — HEPARIN LEVEL (UNFRACTIONATED): Heparin Unfractionated: 0.2 IU/mL — ABNORMAL LOW (ref 0.30–0.70)

## 2022-11-26 MED ORDER — HEPARIN BOLUS VIA INFUSION
3000.0000 [IU] | Freq: Once | INTRAVENOUS | Status: AC
  Administered 2022-11-26: 3000 [IU] via INTRAVENOUS
  Filled 2022-11-26: qty 3000

## 2022-11-26 MED ORDER — RIVAROXABAN 20 MG PO TABS
20.0000 mg | ORAL_TABLET | Freq: Every day | ORAL | Status: DC
  Administered 2022-11-26: 20 mg via ORAL
  Filled 2022-11-26: qty 1

## 2022-11-26 MED ORDER — METFORMIN HCL 1000 MG PO TABS: 1000.0000 mg | ORAL_TABLET | Freq: Every day | ORAL | 0 refills | Status: DC

## 2022-11-26 MED ORDER — FUROSEMIDE 20 MG PO TABS: 20.0000 mg | ORAL_TABLET | Freq: Every day | ORAL | 0 refills | Status: DC | PRN

## 2022-11-26 MED ORDER — NITROGLYCERIN 0.4 MG SL SUBL: 0.4000 mg | SUBLINGUAL_TABLET | SUBLINGUAL | 0 refills | Status: DC | PRN

## 2022-11-26 MED ORDER — ASPIRIN 81 MG PO CHEW: 81.0000 mg | CHEWABLE_TABLET | Freq: Every day | ORAL | 0 refills | Status: DC

## 2022-11-26 MED ORDER — ATORVASTATIN CALCIUM 40 MG PO TABS: 40.0000 mg | ORAL_TABLET | Freq: Every day | ORAL | 0 refills | Status: DC

## 2022-11-26 MED ORDER — PANTOPRAZOLE SODIUM 20 MG PO TBEC: 20.0000 mg | DELAYED_RELEASE_TABLET | Freq: Every day | ORAL | 0 refills | Status: DC

## 2022-11-26 MED ORDER — METOPROLOL SUCCINATE ER 25 MG PO TB24: 25.0000 mg | ORAL_TABLET | Freq: Every day | ORAL | 0 refills | Status: DC

## 2022-11-26 MED ORDER — LOSARTAN POTASSIUM 50 MG PO TABS: 50.0000 mg | ORAL_TABLET | Freq: Every day | ORAL | 0 refills | Status: DC

## 2022-11-26 MED ORDER — ISOSORBIDE MONONITRATE ER 30 MG PO TB24: 30.0000 mg | ORAL_TABLET | Freq: Every day | ORAL | 0 refills | Status: DC

## 2022-11-26 MED ORDER — RIVAROXABAN 20 MG PO TABS: 20.0000 mg | ORAL_TABLET | Freq: Every day | ORAL | 0 refills | Status: DC

## 2022-11-26 NOTE — Progress Notes (Signed)
CSW spoke with patient regarding dc today, reports may need transportation via taxi home, requested letter from MD regarding court. CSW has updated MD. Patient reports no additional discharge needs at this time.   Darolyn Rua, [place], MSW, [place] [phone]

## 2022-11-26 NOTE — ED Notes (Signed)
Patient has pulled off cardiac monitor as well as BP cuff and pulse ox; educated on importance of staying on monitor.

## 2022-11-26 NOTE — Consult Note (Addendum)
ANTICOAGULATION CONSULT NOTE  Pharmacy Consult for heparin infusion Indication: chest pain/ACS, hx of afib/flutter on Xarelto PTA  Allergies  Allergen Reactions   Amoxicillin Swelling   Keflex [Cephalexin] Rash    Patient Measurements: Height: 5\' 11"  (180.3 cm) Weight: 113.4 kg (250 lb) IBW/kg (Calculated) : 75.3 Heparin Dosing Weight: 99.9 kg   Vital Signs: BP: 134/88 (06/14 0500) Pulse Rate: 100 (06/14 0500)  Labs: Recent Labs    11/24/22 1312 11/24/22 1427 11/24/22 1521 11/24/22 2252 11/25/22 0523 11/25/22 1342 11/25/22 1614 11/26/22 0013 11/26/22 0450  HGB 15.8  --   --   --  13.8  --   --   --   --   HCT 46.4  --   --   --  40.7  --   --   --   --   PLT 253  --   --   --  210  --   --   --   --   APTT  --  42*  --    < > 112*   < > 75* 66* 31  HEPARINUNFRC  --  >1.10*  --   --  >1.10*  --   --   --  0.20*  CREATININE 1.50*  --   --   --  1.20  --   --   --   --   TROPONINIHS 137*  --  141*  --   --   --   --   --   --    < > = values in this interval not displayed.    Estimated Creatinine Clearance: 78.6 mL/min (by C-G formula based on SCr of 1.2 mg/dL).   Medical History: Past Medical History:  Diagnosis Date   Coronary artery disease    Diabetes mellitus without complication (HCC)    Hypertension    S/P CABG x 1     Medications:  PTA: xarelto 20 mg daily: last dose 6/11  Assessment: 66yoM with a PMH of CAD s/p CABG, HFrEF (35%), atrial fibrillation/flutter on rivaroxaban PTA, presented to ED 11/24/22 with midsternal chest pain radiating to the right jaw, diaphoresis that started while he was watching TV. Troponin I elevated at 137. Pharmacy has been consulted to initiate and manage IV heparin therapy for ACS. No acute findings on chest x-ray. Echo in process.  Baseline: HL > 1.10, aPTT 42, Hgb 15.8 Plt 253    Goal of Therapy:  Heparin level 0.3-0.7 units/ml aPTT 66-102 seconds Monitor platelets by anticoagulation protocol: Yes   06/12 2252  aPTT 113, supratherapeutic 06/13 0523 aPTT 112, supratherapeutic / HL >1.10 not correlating 06/13 1342 aPTT >200, supratherapeutic, suspect error  06/13 1614 aPTT 75, therapeutic x 1 06/14 0013 aPTT 66, therapeutic x 2 06/14 0450 aPTT 31, subtherapeutic / HL 0.20  Plan:  Contacted RN, no line issues. Bolus 3000 units x 1 Increase heparin infusion to 1500 units/hr Recheck aPTT in 8 hrs after rate change Transition to HL once therapeutic and correlating with aPTT Continue to monitor H&H and platelets Daily CBC per protocol  Otelia Sergeant, PharmD, [facility] 11/26/2022 5:41 AM

## 2022-11-26 NOTE — ED Notes (Signed)
Patient reports headache, will medicate per MAR.

## 2022-11-26 NOTE — Consult Note (Signed)
ANTICOAGULATION CONSULT NOTE  Pharmacy Consult for heparin infusion Indication: chest pain/ACS, hx of afib/flutter on Xarelto PTA  Allergies  Allergen Reactions   Amoxicillin Swelling   Keflex [Cephalexin] Rash    Patient Measurements: Height: 5\' 11"  (180.3 cm) Weight: 113.4 kg (250 lb) IBW/kg (Calculated) : 75.3 Heparin Dosing Weight: 99.9 kg   Vital Signs: Temp: 98.1 F (36.7 C) (06/13 1600) Temp Source: Oral (06/13 1540) BP: 129/75 (06/13 2200) Pulse Rate: 97 (06/13 2200)  Labs: Recent Labs    11/24/22 1312 11/24/22 1427 11/24/22 1521 11/24/22 2252 11/25/22 0523 11/25/22 1342 11/25/22 1614 11/26/22 0013  HGB 15.8  --   --   --  13.8  --   --   --   HCT 46.4  --   --   --  40.7  --   --   --   PLT 253  --   --   --  210  --   --   --   APTT  --  42*  --    < > 112* >200* 75* 66*  HEPARINUNFRC  --  >1.10*  --   --  >1.10*  --   --   --   CREATININE 1.50*  --   --   --  1.20  --   --   --   TROPONINIHS 137*  --  141*  --   --   --   --   --    < > = values in this interval not displayed.    Estimated Creatinine Clearance: 78.6 mL/min (by C-G formula based on SCr of 1.2 mg/dL).   Medical History: Past Medical History:  Diagnosis Date   Coronary artery disease    Diabetes mellitus without complication (HCC)    Hypertension    S/P CABG x 1     Medications:  PTA: xarelto 20 mg daily: last dose 6/11  Assessment: 66yoM with a PMH of CAD s/p CABG, HFrEF (35%), atrial fibrillation/flutter on rivaroxaban PTA, presented to ED 11/24/22 with midsternal chest pain radiating to the right jaw, diaphoresis that started while he was watching TV. Troponin I elevated at 137. Pharmacy has been consulted to initiate and manage IV heparin therapy for ACS. No acute findings on chest x-ray. Echo in process.  Baseline: HL > 1.10, aPTT 42, Hgb 15.8 Plt 253    Goal of Therapy:  Heparin level 0.3-0.7 units/ml aPTT 66-102 seconds Monitor platelets by anticoagulation protocol:  Yes   06/12 2252 aPTT 113, supratherapeutic 06/13 0523 aPTT 112, supratherapeutic / HL >1.10 not correlating 06/13 1342 aPTT >200, supratherapeutic, suspect error  06/13 1614 aPTT 75, therapeutic x 1 06/14 0013 aPTT 66, therapeutic x 2  Plan:  Continue heparin infusion at 1200 units/hr Recheck aPTT & HL daily w/ AM labs Transition to HL once therapeutic and correlating with aPTT Continue to monitor H&H and platelets Daily CBC per protocol  Otelia Sergeant, PharmD, [facility] 11/26/2022 12:41 AM

## 2022-11-26 NOTE — Discharge Summary (Signed)
Physician Discharge Summary   Patient: Jeremy [name] MRN: [id number]  DOB: [birth year]   Admit:     Date of Admission: 11/24/2022 Admitted from: home   Discharge: Date of discharge: 11/26/22 Disposition: Home Condition at discharge: good  CODE STATUS: FULL CODE     Discharge Physician: Sunnie Nielsen, DO [facility]     PCP: Barbette Reichmann, MD  Recommendations for Outpatient Follow-up:  Follow up with PCP Barbette Reichmann, MD in 1-2 weeks Follow up with cardiology as directed Please obtain labs/tests: CBC, BMP, consider EKG in 1-2 weeks Please follow up on the following pending results: TSH, grave/hashimotos Ab PCP AND OTHER OUTPATIENT PROVIDERS: SEE BELOW FOR SPECIFIC DISCHARGE INSTRUCTIONS PRINTED FOR PATIENT IN ADDITION TO GENERIC AVS PATIENT INFO    Discharge Instructions     AMB Referral to Cardiac Rehabilitation - Phase II   Complete by: As directed    Diagnosis: NSTEMI    After initial evaluation and assessments completed: Virtual Based Care may be provided alone or in conjunction with Phase 2 Cardiac Rehab based on patient barriers.: Yes   Intensive Cardiac Rehabilitation (ICR) [facility] location only OR Traditional Cardiac Rehabilitation (TCR) *If criteria for ICR are not met will enroll in TCR [facility] only): Yes   Diet - low sodium heart healthy   Complete by: As directed    Diet Carb Modified   Complete by: As directed    Increase activity slowly   Complete by: As directed          Discharge Diagnoses: Principal Problem:   NSTEMI (non-ST elevated myocardial infarction) (HCC)   Abnormal TSH Active Problems:   History of cocaine use   History of marijuana use   History of illicit drug use   Essential hypertension   Medically noncompliant   Tobacco abuse   Diabetes mellitus, type 2 (HCC)   Aortic atherosclerosis (HCC)   Obesity (BMI 30-39.9)   Hx of CABG   Stage 3 chronic kidney disease (HCC)   ST elevation myocardial infarction  (STEMI) of posterior wall (HCC)   HFrEF (heart failure with reduced ejection fraction) (HCC)   PAF (paroxysmal atrial fibrillation) (HCC)   OSA (obstructive sleep apnea)   Coronary artery disease involving native coronary artery of native heart   Polysubstance abuse [facility])   Hyperlipidemia       Hospital Course: Jeremy [name] is a 67 y.o. male with medical history significant of CAD, s/p CABG, ICM/HFrEF LVEF 35% on 06/10/2021,, atrial fibrillation, T2DM, HLD, OSA not on CPAP, polysubstance abuse including cocaine and marijuana, tobacco abuse, PE s/p thrombectomy on 12/24/2020, suspected medical noncompliance who presented to ED 11/24/2022 with chest pain starting 2 days prior. Of note, he was recently arrested and has LLE monitor in place.  06/12: ED, T97.6, P88, RR 18, BP 127/74 and O2 sats 93%-98 on room air.  Labs showed troponin 137, creatinine 1.5, nonrevealing CBC.  EKG showed TWI in lateral and inferior leads. CXR showed no acute change.  He was given aspirin 324 mg p.o. x 1, nitroglycerin sublingual, morphine IV in the ED.  Cardiologist was consulted by ED provider. Advised heparin gtt x48 hours, deferring invasive procedures for now given flat troponins  06/13: TSH low (chronic since 02/2022), T4 slightly up. Echo pending. Cardiology following.  06/14: competed heparin gtt x48 h. Per RN when he was told about d/c plan he complained of sharp pain - repeat EKG reviewed, d/w cardiology, no concerns, still ok for discharge   Consultants:  Cardiology   Procedures: None       ASSESSMENT & PLAN:   NSTEMI CAD History of CABG History of ICM/HFrEF LVEF 35% on 06/02/2021 HLD Aortic atherosclerosis  EKG changes with TWI in inferior and lateral leads, which is new compared to his old EKGs  Heparin gtt x48 h Echo no concerns  Cardiology following outpatient ASA 81 Increase potency statin ACE/ARB as BP allows GDMT for CHF w/ SGLT2, spironolactone as BP and renal fxn allows No  plans for invasive testing at this time  Lipids, A1C  PAF now in NSR  medical noncompliance w/ Xarelto/amiodarone unclear when last dose  History of PE s/p thrombectomy on 12/24/2020  restart Xarelto on d/c  Abnormal thyroid function test Low TSH >6 mos Elevated T4, mild Ab testing for Graves/Hashimotos Follow outpatient   T2DM A1C  CKD III Montior BMP  Polysubstance abuse including cocaine and marijuana  UDS (+)cannabinoid Tobacco use Advise abstain recreational drugs and tobacco   Obesity Body mass index is 34.87 kg/m. Weight loss as able outpatient   OSA  Not on CPAP Follow outpatient            Discharge Instructions  Allergies as of 11/26/2022       Reactions   Amoxicillin Swelling   Keflex [cephalexin] Rash        Medication List     STOP taking these medications    amiodarone 200 MG tablet Commonly known as: PACERONE   blood glucose meter kit and supplies Kit   Entresto 49-51 MG Generic drug: sacubitril-valsartan   spironolactone 25 MG tablet Commonly known as: ALDACTONE   tadalafil 20 MG tablet Commonly known as: CIALIS       TAKE these medications    aspirin 81 MG chewable tablet Chew 1 tablet (81 mg total) by mouth daily. Start taking on: November 27, 2022   atorvastatin 40 MG tablet Commonly known as: LIPITOR Take 1 tablet (40 mg total) by mouth daily. Start taking on: November 27, 2022 What changed:  medication strength how much to take   furosemide 20 MG tablet Commonly known as: Lasix Take 1 tablet (20 mg total) by mouth daily as needed for edema. What changed:  when to take this reasons to take this   isosorbide mononitrate 30 MG 24 hr tablet Commonly known as: IMDUR Take 1 tablet (30 mg total) by mouth daily. Start taking on: November 27, 2022   Linzess 72 MCG capsule Generic drug: linaclotide Take 72 mcg by mouth daily as needed.   losartan 50 MG tablet Commonly known as: COZAAR Take 1 tablet (50 mg total)  by mouth daily.   metFORMIN 1000 MG tablet Commonly known as: GLUCOPHAGE Take 1 tablet (1,000 mg total) by mouth once daily with breakfast. What changed: when to take this   metoprolol succinate 25 MG 24 hr tablet Commonly known as: TOPROL-XL Take 1 tablet (25 mg total) by mouth daily. What changed:  medication strength how much to take   nitroGLYCERIN 0.4 MG SL tablet Commonly known as: NITROSTAT Place 1 tablet (0.4 mg total) under the tongue every 5 (five) minutes as needed for chest pain.   pantoprazole 20 MG tablet Commonly known as: PROTONIX Take 1 tablet (20 mg total) by mouth daily. Start taking on: November 27, 2022   rivaroxaban 20 MG Tabs tablet Commonly known as: XARELTO Take 1 tablet (20 mg total) by mouth daily with supper. What changed: Another medication with the same name was removed. Continue taking  this medication, and follow the directions you see here.          Allergies  Allergen Reactions   Amoxicillin Swelling   Keflex [Cephalexin] Rash     Subjective: pt feeling well this morning. Per RN when he was told about d/c plan he complained of sharp pain briefly, see hospital course. No further concerns. Denies SOB. States he is upset about his legal situation, asks about an excuse for court.    Discharge Exam: BP 125/63   Pulse 91   Temp 98.6 F (37 C) (Oral)   Resp (!) 22   Ht 5\' 11"  (1.803 m)   Wt 113.4 kg   SpO2 99%   BMI 34.87 kg/m  General: Pt is alert, awake, not in acute distress Cardiovascular: RRR, S1/S2 +, no rubs, no gallops Respiratory: CTA bilaterally, no wheezing, no rhonchi Abdominal: Soft, NT, ND, bowel sounds + Extremities: no edema, no cyanosis     The results of significant diagnostics from this hospitalization (including imaging, microbiology, ancillary and laboratory) are listed below for reference.     Microbiology: No results found for this or any previous visit (from the past 240 hour(s)).   Labs: BNP (last 3  results) No results for input(s): "BNP" in the last 8760 hours. Basic Metabolic Panel: Recent Labs  Lab 11/24/22 1312 11/25/22 0523  NA 137 138  K 4.2 3.9  CL 107 109  CO2 19* 22  GLUCOSE 109* 108*  BUN 22 21  CREATININE 1.50* 1.20  CALCIUM 9.0 9.0   Liver Function Tests: No results for input(s): "AST", "ALT", "ALKPHOS", "BILITOT", "PROT", "ALBUMIN" in the last 168 hours. No results for input(s): "LIPASE", "AMYLASE" in the last 168 hours. No results for input(s): "AMMONIA" in the last 168 hours. CBC: Recent Labs  Lab 11/24/22 1312 11/25/22 0523 11/26/22 0550  WBC 5.8 5.9 5.3  HGB 15.8 13.8 13.2  HCT 46.4 40.7 39.2  MCV 95.3 96.2 96.8  PLT 253 210 228   Cardiac Enzymes: No results for input(s): "CKTOTAL", "CKMB", "CKMBINDEX", "TROPONINI" in the last 168 hours. BNP: Invalid input(s): "POCBNP" CBG: Recent Labs  Lab 11/24/22 2216 11/25/22 0747 11/25/22 1720 11/26/22 0847 11/26/22 1208  GLUCAP 89 101* 93 96 130*   D-Dimer No results for input(s): "DDIMER" in the last 72 hours. Hgb A1c Recent Labs    11/25/22 0523  HGBA1C 6.0*   Lipid Profile Recent Labs    11/25/22 0523  CHOL 88  HDL 31*  LDLCALC 44  TRIG 67  CHOLHDL 2.8   Thyroid function studies Recent Labs    11/25/22 0523  TSH 0.125*   Anemia work up No results for input(s): "VITAMINB12", "FOLATE", "FERRITIN", "TIBC", "IRON", "RETICCTPCT" in the last 72 hours. Urinalysis    Component Value Date/Time   COLORURINE YELLOW (A) 12/26/2020 2218   APPEARANCEUR CLOUDY (A) 12/26/2020 2218   LABSPEC 1.017 12/26/2020 2218   PHURINE 5.0 12/26/2020 2218   GLUCOSEU NEGATIVE 12/26/2020 2218   HGBUR NEGATIVE 12/26/2020 2218   BILIRUBINUR NEGATIVE 12/26/2020 2218   KETONESUR NEGATIVE 12/26/2020 2218   PROTEINUR NEGATIVE 12/26/2020 2218   NITRITE NEGATIVE 12/26/2020 2218   LEUKOCYTESUR TRACE (A) 12/26/2020 2218   Sepsis Labs Recent Labs  Lab 11/24/22 1312 11/25/22 0523 11/26/22 0550  WBC 5.8  5.9 5.3   Microbiology No results found for this or any previous visit (from the past 240 hour(s)). Imaging ECHOCARDIOGRAM COMPLETE  Result Date: 11/26/2022    ECHOCARDIOGRAM REPORT   Patient Name:  Jeremy [name] Date of Exam: 11/24/2022 Medical Rec #:  [id number]       Height:       71.0 in Accession #:    [id number]      Weight:       250.0 lb Date of Birth:  June 17, 1955      BSA:          2.318 m Patient Age:    66 years        BP:           115/64 mmHg Patient Gender: M               HR:           73 bpm. Exam Location:  [facility] Procedure: 2D Echo, Cardiac Doppler, Color Doppler and Intracardiac            Opacification Agent Indications:     I21.4 NSTEMI  History:         Patient has prior history of Echocardiogram examinations, most                  recent 12/24/2020. CAD, Prior CABG; Risk Factors:Diabetes and                  Hypertension.  Sonographer:     Daphine Deutscher RDCS Referring Phys:  [id number] Cheryln Manly TANG Diagnosing Phys: Marcina Millard MD IMPRESSIONS  1. Left ventricular ejection fraction, by estimation, is 55 to 60%. The left ventricle has normal function. The left ventricle has no regional wall motion abnormalities. There is mild left ventricular hypertrophy. Indeterminate diastolic filling due to E-A fusion.  2. Right ventricular systolic function is normal. The right ventricular size is normal.  3. The mitral valve is normal in structure. Mild mitral valve regurgitation. No evidence of mitral stenosis.  4. The aortic valve is normal in structure. Aortic valve regurgitation is mild. Aortic valve sclerosis/calcification is present, without any evidence of aortic stenosis.  5. The inferior vena cava is normal in size with greater than 50% respiratory variability, suggesting right atrial pressure of 3 mmHg. FINDINGS  Left Ventricle: Left ventricular ejection fraction, by estimation, is 55 to 60%. The left ventricle has normal function. The left ventricle has no regional wall  motion abnormalities. Definity contrast agent was given IV to delineate the left ventricular  endocardial borders. The left ventricular internal cavity size was normal in size. There is mild left ventricular hypertrophy. Indeterminate diastolic filling due to E-A fusion. Right Ventricle: The right ventricular size is normal. No increase in right ventricular wall thickness. Right ventricular systolic function is normal. Left Atrium: Left atrial size was normal in size. Right Atrium: Right atrial size was normal in size. Pericardium: There is no evidence of pericardial effusion. Mitral Valve: The mitral valve is normal in structure. Mild mitral valve regurgitation. No evidence of mitral valve stenosis. Tricuspid Valve: The tricuspid valve is normal in structure. Tricuspid valve regurgitation is mild . No evidence of tricuspid stenosis. Aortic Valve: The aortic valve is normal in structure. Aortic valve regurgitation is mild. Aortic valve sclerosis/calcification is present, without any evidence of aortic stenosis. Pulmonic Valve: The pulmonic valve was normal in structure. Pulmonic valve regurgitation is not visualized. No evidence of pulmonic stenosis. Aorta: The aortic root is normal in size and structure. Venous: The inferior vena cava is normal in size with greater than 50% respiratory variability, suggesting right atrial pressure of 3 mmHg. IAS/Shunts: No atrial level shunt detected by color flow  Doppler.  LEFT VENTRICLE PLAX 2D LVIDd:         5.60 cm   Diastology LVIDs:         3.80 cm   LV e' medial:    16.73 cm/s LV PW:         1.00 cm   LV E/e' medial:  4.5 LV IVS:        1.00 cm   LV e' lateral:   14.47 cm/s LVOT diam:     2.10 cm   LV E/e' lateral: 5.2 LV SV:         53 LV SV Index:   23 LVOT Area:     3.46 cm  RIGHT VENTRICLE RV Basal diam:  5.00 cm RV S prime:     12.20 cm/s TAPSE (M-mode): 1.6 cm LEFT ATRIUM             Index        RIGHT ATRIUM           Index LA diam:        3.90 cm 1.68 cm/m   RA  Area:     18.00 cm LA Vol (A2C):   48.8 ml 21.05 ml/m  RA Volume:   55.10 ml  23.77 ml/m LA Vol (A4C):   50.5 ml 21.78 ml/m LA Biplane Vol: 52.6 ml 22.69 ml/m  AORTIC VALVE LVOT Vmax:   97.15 cm/s LVOT Vmean:  58.100 cm/s LVOT VTI:    0.152 m  AORTA Ao Root diam: 3.90 cm Ao Asc diam:  3.80 cm MITRAL VALVE MV Area (PHT): 5.27 cm    SHUNTS MV Decel Time: 144 msec    Systemic VTI:  0.15 m MV E velocity: 75.75 cm/s  Systemic Diam: 2.10 cm MV A velocity: 54.35 cm/s MV E/A ratio:  1.39 Marcina Millard MD Electronically signed by Marcina Millard MD Signature Date/Time: 11/26/2022/1:29:35 PM    Final    DG Chest Port 1 View  Result Date: 11/24/2022 CLINICAL DATA:  Chest pain. EXAM: PORTABLE CHEST 1 VIEW COMPARISON:  05/11/2022 and CT chest 12/22/2020. FINDINGS: Trachea is midline. Heart size stable. Defibrillator pad projects over the medial left chest. Lungs are clear. No pleural fluid. IMPRESSION: No acute findings. Electronically Signed   By: Leanna Battles M.D.   On: 11/24/2022 14:10      Time coordinating discharge: over 30 minutes  SIGNED:  Sunnie Nielsen DO [facility]

## 2022-11-26 NOTE — Progress Notes (Signed)
[facility] Cardiology  SUBJECTIVE: Patient laying in bed, reports heart pounding and epigastric burning, but denies chest pain   Vitals:   11/26/22 0500 11/26/22 0600 11/26/22 0658 11/26/22 0720  BP: 134/88 (!) 126/91  113/71  Pulse: 100 (!) 118  (!) 111  Resp: 16 18  14   Temp:   98.4 F (36.9 C)   TempSrc:      SpO2: 94% 95%  97%  Weight:      Height:         Intake/Output Summary (Last 24 hours) at 11/26/2022 1610 Last data filed at 11/26/2022 9604 Gross per 24 hour  Intake --  Output 1050 ml  Net -1050 ml      PHYSICAL EXAM  General: Well developed, well nourished, in no acute distress HEENT:  Normocephalic and atramatic Neck:  No JVD.  Lungs: Clear bilaterally to auscultation and percussion. Heart: HRRR . Normal S1 and S2 without gallops or murmurs.  Abdomen: Bowel sounds are positive, abdomen soft and non-tender  Msk:  Back normal, normal gait. Normal strength and tone for age. Extremities: No clubbing, cyanosis or edema.   Neuro: Alert and oriented X 3. Psych:  Good affect, responds appropriately   LABS: Basic Metabolic Panel: Recent Labs    11/24/22 1312 11/25/22 0523  NA 137 138  K 4.2 3.9  CL 107 109  CO2 19* 22  GLUCOSE 109* 108*  BUN 22 21  CREATININE 1.50* 1.20  CALCIUM 9.0 9.0   Liver Function Tests: No results for input(s): "AST", "ALT", "ALKPHOS", "BILITOT", "PROT", "ALBUMIN" in the last 72 hours. No results for input(s): "LIPASE", "AMYLASE" in the last 72 hours. CBC: Recent Labs    11/25/22 0523 11/26/22 0550  WBC 5.9 5.3  HGB 13.8 13.2  HCT 40.7 39.2  MCV 96.2 96.8  PLT 210 228   Cardiac Enzymes: No results for input(s): "CKTOTAL", "CKMB", "CKMBINDEX", "TROPONINI" in the last 72 hours. BNP: Invalid input(s): "POCBNP" D-Dimer: No results for input(s): "DDIMER" in the last 72 hours. Hemoglobin A1C: Recent Labs    11/25/22 0523  HGBA1C 6.0*   Fasting Lipid Panel: Recent Labs    11/25/22 0523  CHOL 88  HDL 31*  LDLCALC 44   TRIG 67  CHOLHDL 2.8   Thyroid Function Tests: Recent Labs    11/25/22 0523  TSH 0.125*   Anemia Panel: No results for input(s): "VITAMINB12", "FOLATE", "FERRITIN", "TIBC", "IRON", "RETICCTPCT" in the last 72 hours.  DG Chest Port 1 View  Result Date: 11/24/2022 CLINICAL DATA:  Chest pain. EXAM: PORTABLE CHEST 1 VIEW COMPARISON:  05/11/2022 and CT chest 12/22/2020. FINDINGS: Trachea is midline. Heart size stable. Defibrillator pad projects over the medial left chest. Lungs are clear. No pleural fluid. IMPRESSION: No acute findings. Electronically Signed   By: Leanna Battles M.D.   On: 11/24/2022 14:10     Echo pending  TELEMETRY: Atrial fibrillation 90 bpm:  ASSESSMENT AND PLAN:  Principal Problem:   NSTEMI (non-ST elevated myocardial infarction) (HCC) Active Problems:   Essential hypertension   Medically noncompliant   History of cocaine use   History of marijuana use   History of illicit drug use   Tobacco abuse   Diabetes mellitus, type 2 (HCC)   Aortic atherosclerosis (HCC)   Obesity (BMI 30-39.9)   Hx of CABG   Stage 3 chronic kidney disease (HCC)   Acute ST elevation myocardial infarction (STEMI) of posterior wall (HCC)   HFrEF (heart failure with reduced ejection fraction) (HCC)  PAF (paroxysmal atrial fibrillation) (HCC)   OSA (obstructive sleep apnea)   Coronary artery disease involving native coronary artery of native heart   Polysubstance abuse (HCC)   Hyperlipidemia    1.  Mildly elevated troponin, (137, 141), with atypical chest pain, history of crack cocaine use, on heparin drip x 48 hours 2.  Status post CABG x 2, 08/03/2021, with LIMA-LAD, SVG-OM1 3.  Chronic HFrEF, isosorbide mononitrate and losartan 4.  Paroxysmal atrial fibrillation, on Xarelto for stroke prevention, and amiodarone for rate and rhythm control, questionable compliance 5.  Tobacco abuse 6.  Crack cocaine use  Recommendations  1.  Agree with current therapy 2.  DC heparin  later today, transition to Xarelto for stroke prevention 3.  Defer cardiac catheterization 4.  Continue isosorbide mononitrate 5.  Consider discharge home later today or in a.m. 6.  Follow-up with Dr. Guss Bunde within 1 to 2 weeks   Marcina Millard, MD, PhD, [facility] 11/26/2022 9:22 AM

## 2022-11-26 NOTE — ED Notes (Signed)
Patient pulled off BP cuff, cardiac monitor and pulse ox. Educated on importance of staying attached to monitor.

## 2022-11-26 NOTE — ED Notes (Signed)
Patient complaining of sharp chest pain after being told he would be discharged at 1600, repeat EKG obtained and exported to chart for review of hospitalist and Cardiology.

## 2022-11-27 LAB — THYROID PEROXIDASE ANTIBODY: Thyroperoxidase Ab SerPl-aCnc: 11 IU/mL (ref 0–34)

## 2022-11-27 LAB — LIPOPROTEIN A (LPA): Lipoprotein (a): 115.2 nmol/L — ABNORMAL HIGH (ref <75.0)

## 2022-11-27 LAB — T3, FREE: T3, Free: 2.9 pg/mL (ref 2.0–4.4)

## 2022-11-27 LAB — THYROID STIMULATING IMMUNOGLOBULIN: Thyroid Stimulating Immunoglob: <0.1 IU/L (ref 0.00–0.55)

## 2022-12-02 ENCOUNTER — Other Ambulatory Visit: Payer: Self-pay

## 2022-12-02 ENCOUNTER — Emergency Department
Admission: EM | Admit: 2022-12-02 | Discharge: 2022-12-02 | Disposition: A | Discharge status: Home / Self Care | Payer: Medicare HMO | Attending: Emergency Medicine | Admitting: Emergency Medicine

## 2022-12-02 ENCOUNTER — Emergency Department: Payer: Medicare HMO

## 2022-12-02 DIAGNOSIS — Z951 Presence of aortocoronary bypass graft: Secondary | ICD-10-CM | POA: Diagnosis not present

## 2022-12-02 DIAGNOSIS — I130 Hypertensive heart and chronic kidney disease with heart failure and stage 1 through stage 4 chronic kidney disease, or unspecified chronic kidney disease: Secondary | ICD-10-CM | POA: Insufficient documentation

## 2022-12-02 DIAGNOSIS — I2510 Atherosclerotic heart disease of native coronary artery without angina pectoris: Secondary | ICD-10-CM | POA: Diagnosis not present

## 2022-12-02 DIAGNOSIS — I5020 Unspecified systolic (congestive) heart failure: Secondary | ICD-10-CM | POA: Insufficient documentation

## 2022-12-02 DIAGNOSIS — I4891 Unspecified atrial fibrillation: Secondary | ICD-10-CM | POA: Diagnosis not present

## 2022-12-02 DIAGNOSIS — I499 Cardiac arrhythmia, unspecified: Secondary | ICD-10-CM | POA: Diagnosis not present

## 2022-12-02 DIAGNOSIS — R079 Chest pain, unspecified: Secondary | ICD-10-CM | POA: Diagnosis not present

## 2022-12-02 DIAGNOSIS — N183 Chronic kidney disease, stage 3 unspecified: Secondary | ICD-10-CM | POA: Diagnosis not present

## 2022-12-02 DIAGNOSIS — R0602 Shortness of breath: Secondary | ICD-10-CM | POA: Diagnosis not present

## 2022-12-02 DIAGNOSIS — R42 Dizziness and giddiness: Secondary | ICD-10-CM | POA: Diagnosis not present

## 2022-12-02 DIAGNOSIS — E1122 Type 2 diabetes mellitus with diabetic chronic kidney disease: Secondary | ICD-10-CM | POA: Diagnosis not present

## 2022-12-02 DIAGNOSIS — R0789 Other chest pain: Secondary | ICD-10-CM | POA: Insufficient documentation

## 2022-12-02 DIAGNOSIS — I213 ST elevation (STEMI) myocardial infarction of unspecified site: Secondary | ICD-10-CM | POA: Diagnosis not present

## 2022-12-02 LAB — CBC WITH DIFFERENTIAL/PLATELET
Abs Immature Granulocytes: 0.01 10*3/uL (ref 0.00–0.07)
Basophils Absolute: 0 10*3/uL (ref 0.0–0.1)
Basophils Relative: 1 %
Eosinophils Absolute: 0.1 10*3/uL (ref 0.0–0.5)
Eosinophils Relative: 2 %
HCT: 42.1 % (ref 39.0–52.0)
Hemoglobin: 14.4 g/dL (ref 13.0–17.0)
Immature Granulocytes: 0 %
Lymphocytes Relative: 34 %
Lymphs Abs: 1.9 10*3/uL (ref 0.7–4.0)
MCH: 32.6 pg (ref 26.0–34.0)
MCHC: 34.2 g/dL (ref 30.0–36.0)
MCV: 95.2 fL (ref 80.0–100.0)
Monocytes Absolute: 0.6 10*3/uL (ref 0.1–1.0)
Monocytes Relative: 10 %
Neutro Abs: 3.1 10*3/uL (ref 1.7–7.7)
Neutrophils Relative %: 53 %
Platelets: 299 10*3/uL (ref 150–400)
RBC: 4.42 MIL/uL (ref 4.22–5.81)
RDW: 13.2 % (ref 11.5–15.5)
WBC: 5.8 10*3/uL (ref 4.0–10.5)
nRBC: 0 % (ref 0.0–0.2)

## 2022-12-02 LAB — COMPREHENSIVE METABOLIC PANEL
ALT: 17 U/L (ref 0–44)
AST: 20 U/L (ref 15–41)
Albumin: 4.3 g/dL (ref 3.5–5.0)
Alkaline Phosphatase: 49 U/L (ref 38–126)
Anion gap: 9 (ref 5–15)
BUN: 24 mg/dL — ABNORMAL HIGH (ref 8–23)
CO2: 19 mmol/L — ABNORMAL LOW (ref 22–32)
Calcium: 9.1 mg/dL (ref 8.9–10.3)
Chloride: 107 mmol/L (ref 98–111)
Creatinine, Ser: 1.71 mg/dL — ABNORMAL HIGH (ref 0.61–1.24)
GFR, Estimated: 44 mL/min — ABNORMAL LOW (ref >60)
Glucose, Bld: 102 mg/dL — ABNORMAL HIGH (ref 70–99)
Potassium: 4.3 mmol/L (ref 3.5–5.1)
Sodium: 135 mmol/L (ref 135–145)
Total Bilirubin: 0.8 mg/dL (ref 0.3–1.2)
Total Protein: 7.3 g/dL (ref 6.5–8.1)

## 2022-12-02 LAB — TROPONIN I (HIGH SENSITIVITY)
Troponin I (High Sensitivity): 52 ng/L — ABNORMAL HIGH (ref <18)
Troponin I (High Sensitivity): 55 ng/L — ABNORMAL HIGH (ref <18)

## 2022-12-02 MED ORDER — RIVAROXABAN 20 MG PO TABS
20.0000 mg | ORAL_TABLET | Freq: Once | ORAL | Status: AC
  Administered 2022-12-02: 20 mg via ORAL
  Filled 2022-12-02: qty 1

## 2022-12-02 MED ORDER — ISOSORBIDE MONONITRATE ER 60 MG PO TB24
30.0000 mg | ORAL_TABLET | Freq: Every day | ORAL | Status: DC
  Administered 2022-12-02: 30 mg via ORAL
  Filled 2022-12-02: qty 1

## 2022-12-02 MED ORDER — METOPROLOL SUCCINATE ER 25 MG PO TB24
25.0000 mg | ORAL_TABLET | Freq: Every day | ORAL | Status: DC
  Administered 2022-12-02: 25 mg via ORAL
  Filled 2022-12-02: qty 1

## 2022-12-02 MED ORDER — FUROSEMIDE 40 MG PO TABS
20.0000 mg | ORAL_TABLET | Freq: Once | ORAL | Status: AC
  Administered 2022-12-02: 20 mg via ORAL
  Filled 2022-12-02: qty 1

## 2022-12-02 MED ORDER — NITROGLYCERIN 0.4 MG SL SUBL
0.4000 mg | SUBLINGUAL_TABLET | Freq: Once | SUBLINGUAL | Status: AC
  Administered 2022-12-02: 0.4 mg via SUBLINGUAL
  Filled 2022-12-02: qty 1

## 2022-12-02 NOTE — ED Provider Notes (Signed)
[facility] Provider Note    Event Date/Time   First MD Initiated Contact with Patient 12/02/22 1458     (approximate)   History   Chest Pain   HPI  Jeremy [name] is a 67 y.o. male with past medical history of coronary artery disease status post CABG, ischemic cardiomyopathy, A-fib, type 2 diabetes, polysubstance use, PE status post thrombectomy who presents with chest pain.  Patient tells me that since leaving the hospital he has had some ongoing intermittent burning in the epigastric region and tightness in his chest and heart pounding.  Tells me it comes on when he thinks about his ex-wife who signed papers to have him kicked out of the house 2 weeks ago.  His pain is not exertional but he does feel somewhat lightheaded/dizzy with walking.  Does endorse some dyspnea that does not especially correlate with his chest pain.  Denies nausea or diaphoresis.  Pain does not radiate.  Says this is similar to when he was in the hospital.  Patient did not take any of his medications today but says he did take them yesterday.  He denies any lower extremity swelling.  Used cocaine yesterday.  Reviewed patient's recent admission for NSTEMI.  He had troponins in the 130s with ischemic sounding chest pain.  He was on heparin drip for 48 hours cardiology was consulted and recommended no acute intervention given flat troponins.     Past Medical History:  Diagnosis Date   Coronary artery disease    Diabetes mellitus without complication (HCC)    Hypertension    S/P CABG x 1     Patient Active Problem List   Diagnosis Date Noted   Acute ST elevation myocardial infarction (STEMI) of posterior wall (HCC) 11/24/2022   HFrEF (heart failure with reduced ejection fraction) (HCC) 11/24/2022   PAF (paroxysmal atrial fibrillation) (HCC) 11/24/2022   OSA (obstructive sleep apnea) 11/24/2022   Coronary artery disease involving native coronary artery of native heart 11/24/2022    Polysubstance abuse (HCC) 11/24/2022   Hyperlipidemia 11/24/2022   Aortic atherosclerosis (HCC) 07/16/2021   Obesity (BMI 30-39.9) 07/16/2021   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/2021   Stage 3 chronic kidney disease (HCC) 07/16/2021   On mechanically assisted ventilation (HCC) 12/28/2020   Atrial fibrillation with RVR (HCC) 12/28/2020   Acute delirium 12/28/2020   Tobacco abuse 12/28/2020   Diabetes mellitus, type 2 (HCC) 12/28/2020   NSTEMI (non-ST elevated myocardial infarction) (HCC)    Acute respiratory failure with hypoxia (HCC)    Bilateral pulmonary embolism (HCC) 12/22/2020   History of cocaine use 11/26/2019   History of marijuana use 11/26/2019   History of illicit drug use 11/26/2019   Pharmacologic therapy 11/26/2019   Acute pain (now resolved) 11/26/2019   MRSA (methicillin resistant Staphylococcus aureus) infection 10/18/2019   Hyperosmolar non-ketotic state due to type 2 diabetes mellitus (HCC) 08/21/2019   Hyponatremia 08/19/2019   AKI (acute kidney injury) (HCC) 08/19/2019   Essential hypertension 08/19/2019   Medically noncompliant 08/19/2019   Post-traumatic osteoarthritis of right ankle 10/23/2018   Skin ulcer of right ankle with fat layer exposed (HCC) 10/23/2018   Status post ORIF of fracture of ankle 10/23/2018   Diabetic foot infection (HCC) 10/22/2018   Pressure injury of skin 02/11/2018     Physical Exam  Triage Vital Signs: ED Triage Vitals  Enc Vitals Group     BP 12/02/22 1449 (!) 113/96     Pulse Rate 12/02/22 1449 97  Resp 12/02/22 1449 14     Temp 12/02/22 1449 97.7 F (36.5 C)     Temp Source 12/02/22 1449 Oral     SpO2 12/02/22 1448 100 %     Weight 12/02/22 1451 240 lb (108.9 kg)     Height 12/02/22 1451 5\' 11"  (1.803 m)     Head Circumference --      Peak Flow --      Pain Score --      Pain Loc --      Pain Edu? --      Excl. in GC? --     Most recent vital signs: Vitals:   12/02/22 1630 12/02/22 1800  BP: 110/69 (!) 117/98  Pulse: (!)  59 91  Resp: 13 (!) 24  Temp:    SpO2: 100% 99%     General: Awake, no distress.  CV:  Good peripheral perfusion. No edema  Resp:  Normal effort. Lungs are clear  Abd:  No distention. Soft nontender Neuro:             Awake, Alert, Oriented x 3  Other:     ED Results / Procedures / Treatments  Labs (all labs ordered are listed, but only abnormal results are displayed) Labs Reviewed  COMPREHENSIVE METABOLIC PANEL - Abnormal; Notable for the following components:      Result Value   CO2 19 (*)    Glucose, Bld 102 (*)    BUN 24 (*)    Creatinine, Ser 1.71 (*)    GFR, Estimated 44 (*)    All other components within normal limits  TROPONIN I (HIGH SENSITIVITY) - Abnormal; Notable for the following components:   Troponin I (High Sensitivity) 52 (*)    All other components within normal limits  TROPONIN I (HIGH SENSITIVITY) - Abnormal; Notable for the following components:   Troponin I (High Sensitivity) 55 (*)    All other components within normal limits  CBC WITH DIFFERENTIAL/PLATELET     EKG  Reviewed and interpreted patient's EKG which shows atrial flutter?  Diffuse T wave inversions in all leads except for aVR aVL and V1, similar in appearance to prior EKG from 6/14   RADIOLOGY I reviewed and interpreted the CXR which does not show any acute cardiopulmonary process    PROCEDURES:  Critical Care performed: No  Procedures  The patient is on the cardiac monitor to evaluate for evidence of arrhythmia and/or significant heart rate changes.   MEDICATIONS ORDERED IN ED: Medications  metoprolol succinate (TOPROL-XL) 24 hr tablet 25 mg (25 mg Oral Given 12/02/22 1551)  rivaroxaban (XARELTO) tablet 20 mg (has no administration in time range)  furosemide (LASIX) tablet 20 mg (has no administration in time range)  isosorbide mononitrate (IMDUR) 24 hr tablet 30 mg (has no administration in time range)  nitroGLYCERIN (NITROSTAT) SL tablet 0.4 mg (0.4 mg Sublingual Given  12/02/22 1551)     IMPRESSION / MDM / ASSESSMENT AND PLAN / ED COURSE  I reviewed the triage vital signs and the nursing notes.                              Patient's presentation is most consistent with acute presentation with potential threat to life or bodily function.  Differential diagnosis includes, but is not limited to, ACS including unstable angina, NSTEMI, noncardiac pain including reflux, panic/anxiety related, PE, less likely dissection, cocaine induced chest pain  The patient is  a 67 year old male with complex cardiac history including CABG x 2, ischemic cardiomyopathy, PE on Xarelto who presents with ongoing intermittent chest pain heart pounding.  Patient admitted recently for NSTEMI that was managed conservatively.  Was discharged 2 days ago.  Comes in today because of ongoing symptoms.  Describes a burning sensation in the upper chest heart pounding and some intermittent chest tightness.  Tells me it is brought on by thoughts of his ex-wife who he has been having social issues with over the last 2 weeks.  Pain is not exertional but he does describe some exertional lightheadedness/dizziness.  Denies nausea or diaphoresis or radiation of pain.  Tells me that pain is exact same pain that he was having when he came to the hospital initially.  Reviewed patient's admission he presented with abnormal EKG and elevated troponins but flat troponins.  Was on heparin drip x 48 hours.  Cardiology was consulted and recommended medical management without plan for cath.  Patient's vital signs are stable.  On exam he overall looks well looks euvolemic.  EKG does have significant diffuse abnormal T wave inversions but these are similar to prior EKG.  Difficult to discern rhythm but I do suspect that its atrial flutter that is rate controlled patient has history of this.  Plan to check CBC CMP and troponin as well as give sublingual nitro.  Patient is certainly high risk but given symptoms are  brought on by emotional stress similar to prior and EKG is unchanged I think that if his pain is controlled and troponins are flat that he can likely be followed as an outpatient.  On repeat assessment patient is feeling improved.  Troponins are 52 and 55 so flat and improved from his prior admission several days ago. His bump somewhat but not far from his baseline at 1.7 today, was 1.5 several days ago.  I did go over patient's medications with him and what he has brought with him and he is still not clear on what he supposed to be taking.  He did not go to his pharmacy after admission.  The metoprolol he has he says he has not been taking it and it is a 200 mg tablet and for his discharge summary he supposed to be taking 25 mg.  Also does not have the Lasix or the Imdur that he is prescribed.  I did give him a dose of metoprolol Lasix Xarelto and Imdur.  I strongly encouraged him to go to his pharmacy to get the correct medications.  I did throw out the tadalafil and spironolactone because these were taken off his medication list.  Also strongly encouraged cardiology follow-up.       FINAL CLINICAL IMPRESSION(S) / ED DIAGNOSES   Final diagnoses:  Chest pain, unspecified type     Rx / DC Orders   ED Discharge Orders     None        Note:  This document was prepared using Dragon voice recognition software and may include unintentional dictation errors.   Georga Hacking, MD 12/02/22 [phone]

## 2022-12-02 NOTE — Discharge Instructions (Addendum)
It is very important that you go to your pharmacy and pick up your medications.  You should be taking 25 mg of metoprolol succinate daily.  The metoprolol that you currently have is a 200 mg tablet so it is not the correct dose.  Please review the medication list on your after visit summary and reviewed this with your pharmacy.  If your chest pain is new or worsening then please return to the emergency department.  Otherwise follow-up with your cardiologist as an outpatient.

## 2022-12-02 NOTE — ED Triage Notes (Signed)
Pt BIB EMS for CC of chest pain, anxiety, and psych related issues due to stress at home. Pt rec dc form here on 6/12 for the same. Pt states he did not take his meds today for reasons of feeling depressed and upset with his spouse.

## 2022-12-02 NOTE — ED Notes (Signed)
Patient provided snack and fluids

## 2022-12-05 ENCOUNTER — Emergency Department
Admission: EM | Admit: 2022-12-05 | Discharge: 2022-12-05 | Disposition: A | Discharge status: Home / Self Care | Payer: Medicare HMO | Attending: Emergency Medicine | Admitting: Emergency Medicine

## 2022-12-05 ENCOUNTER — Other Ambulatory Visit: Payer: Self-pay

## 2022-12-05 DIAGNOSIS — R7989 Other specified abnormal findings of blood chemistry: Secondary | ICD-10-CM | POA: Insufficient documentation

## 2022-12-05 DIAGNOSIS — R519 Headache, unspecified: Secondary | ICD-10-CM | POA: Insufficient documentation

## 2022-12-05 DIAGNOSIS — R002 Palpitations: Secondary | ICD-10-CM | POA: Insufficient documentation

## 2022-12-05 DIAGNOSIS — E860 Dehydration: Secondary | ICD-10-CM | POA: Diagnosis not present

## 2022-12-05 DIAGNOSIS — G4489 Other headache syndrome: Secondary | ICD-10-CM | POA: Diagnosis not present

## 2022-12-05 LAB — CBC WITH DIFFERENTIAL/PLATELET
Abs Immature Granulocytes: 0.02 10*3/uL (ref 0.00–0.07)
Basophils Absolute: 0.1 10*3/uL (ref 0.0–0.1)
Basophils Relative: 1 %
Eosinophils Absolute: 0.1 10*3/uL (ref 0.0–0.5)
Eosinophils Relative: 2 %
HCT: 39.3 % (ref 39.0–52.0)
Hemoglobin: 13.8 g/dL (ref 13.0–17.0)
Immature Granulocytes: 0 %
Lymphocytes Relative: 36 %
Lymphs Abs: 2.2 10*3/uL (ref 0.7–4.0)
MCH: 33.1 pg (ref 26.0–34.0)
MCHC: 35.1 g/dL (ref 30.0–36.0)
MCV: 94.2 fL (ref 80.0–100.0)
Monocytes Absolute: 0.7 10*3/uL (ref 0.1–1.0)
Monocytes Relative: 11 %
Neutro Abs: 3 10*3/uL (ref 1.7–7.7)
Neutrophils Relative %: 50 %
Platelets: 308 10*3/uL (ref 150–400)
RBC: 4.17 MIL/uL — ABNORMAL LOW (ref 4.22–5.81)
RDW: 13 % (ref 11.5–15.5)
WBC: 6 10*3/uL (ref 4.0–10.5)
nRBC: 0 % (ref 0.0–0.2)

## 2022-12-05 LAB — BASIC METABOLIC PANEL
Anion gap: 7 (ref 5–15)
BUN: 37 mg/dL — ABNORMAL HIGH (ref 8–23)
CO2: 19 mmol/L — ABNORMAL LOW (ref 22–32)
Calcium: 9.1 mg/dL (ref 8.9–10.3)
Chloride: 109 mmol/L (ref 98–111)
Creatinine, Ser: 1.99 mg/dL — ABNORMAL HIGH (ref 0.61–1.24)
GFR, Estimated: 37 mL/min — ABNORMAL LOW (ref >60)
Glucose, Bld: 113 mg/dL — ABNORMAL HIGH (ref 70–99)
Potassium: 4.2 mmol/L (ref 3.5–5.1)
Sodium: 135 mmol/L (ref 135–145)

## 2022-12-05 LAB — TROPONIN I (HIGH SENSITIVITY)
Troponin I (High Sensitivity): 38 ng/L — ABNORMAL HIGH (ref <18)
Troponin I (High Sensitivity): 40 ng/L — ABNORMAL HIGH (ref <18)

## 2022-12-05 MED ORDER — SODIUM CHLORIDE 0.9 % IV BOLUS
1000.0000 mL | Freq: Once | INTRAVENOUS | Status: AC
  Administered 2022-12-05: 1000 mL via INTRAVENOUS

## 2022-12-05 NOTE — ED Triage Notes (Signed)
Pt presents via EMS c/o headache and palpitations. Reports head hurts bilaterally above ears. EMS reports pt is in a fib but has hx of same.

## 2022-12-05 NOTE — ED Provider Notes (Signed)
[facility] Provider Note    Event Date/Time   First MD Initiated Contact with Patient 12/05/22 1541     (approximate)   History   Palpitations   HPI  Jeremy [name] is a 67 y.o. male   who presents to the urgency department today with primary concern for palpitations.  He states that this occurs somewhat frequently.  Whenever it happens he becomes worried and called EMS.  In addition he also has concern for some headache.  The time my exam the headache has improved.  The patient denies any recent fevers or illness.      Physical Exam   Triage Vital Signs: ED Triage Vitals  Enc Vitals Group     BP 12/05/22 1540 129/66     Pulse Rate 12/05/22 1540 77     Resp 12/05/22 1540 16     Temp 12/05/22 1542 98.8 F (37.1 C)     Temp Source 12/05/22 1542 Oral     SpO2 12/05/22 1540 99 %     Weight --      Height --      Head Circumference --      Peak Flow --      Pain Score 12/05/22 1539 6     Pain Loc --      Pain Edu? --      Excl. in GC? --     Most recent vital signs: Vitals:   12/05/22 1540 12/05/22 1542  BP: 129/66   Pulse: 77   Resp: 16   Temp:  98.8 F (37.1 C)  SpO2: 99%    General: Awake, alert, oriented. CV:  Good peripheral perfusion. Regular rate and rhythm. Resp:  Normal effort. Lungs clear. Abd:  No distention.     ED Results / Procedures / Treatments   Labs (all labs ordered are listed, but only abnormal results are displayed) Labs Reviewed  CBC WITH DIFFERENTIAL/PLATELET - Abnormal; Notable for the following components:      Result Value   RBC 4.17 (*)    All other components within normal limits  BASIC METABOLIC PANEL - Abnormal; Notable for the following components:   CO2 19 (*)    Glucose, Bld 113 (*)    BUN 37 (*)    Creatinine, Ser 1.99 (*)    GFR, Estimated 37 (*)    All other components within normal limits  TROPONIN I (HIGH SENSITIVITY) - Abnormal; Notable for the following components:   Troponin I  (High Sensitivity) 38 (*)    All other components within normal limits  TROPONIN I (HIGH SENSITIVITY) - Abnormal; Notable for the following components:   Troponin I (High Sensitivity) 40 (*)    All other components within normal limits     EKG  I, Phineas Semen, attending physician, personally viewed and interpreted this EKG  EKG Time: 1543 Rate: 78 Rhythm: atrial flutter Axis: normal Intervals: qtc 496 QRS: IVCD ST changes: no st elevation Impression: abnormal ekg   RADIOLOGY None   PROCEDURES:  Critical Care performed: No   MEDICATIONS ORDERED IN ED: Medications - No data to display   IMPRESSION / MDM / ASSESSMENT AND PLAN / ED COURSE  I reviewed the triage vital signs and the nursing notes.                              Differential diagnosis includes, but is not limited to, arrhythmia, electrolyte abnormality,  dehydration.  Patient's presentation is most consistent with acute presentation with potential threat to life or bodily function.   The patient is on the cardiac monitor to evaluate for evidence of arrhythmia and/or significant heart rate changes.  Patient presented to the emergency department today because of concerns for palpitations.  EKG here is consistent with atrial flutter which patient has had previously.  Blood work does show signs concerning for some dehydration with elevated creatinine.  Patient was given IV fluids here in the emergency department.  No significant troponin elevation.  This time I think would be reasonable for patient be discharged home.      FINAL CLINICAL IMPRESSION(S) / ED DIAGNOSES   Final diagnoses:  Palpitations  Dehydration     Note:  This document was prepared using Dragon voice recognition software and may include unintentional dictation errors.    Phineas Semen, MD 12/05/22 [phone]

## 2022-12-05 NOTE — Discharge Instructions (Signed)
Please seek medical attention for any high fevers, chest pain, shortness of breath, change in behavior, persistent vomiting, bloody stool or any other new or concerning symptoms.  

## 2022-12-07 ENCOUNTER — Emergency Department: Payer: Medicare HMO

## 2022-12-07 ENCOUNTER — Encounter: Payer: Self-pay | Admitting: Emergency Medicine

## 2022-12-07 ENCOUNTER — Emergency Department
Admission: EM | Admit: 2022-12-07 | Discharge: 2022-12-07 | Disposition: A | Discharge status: Home / Self Care | Payer: Medicare HMO | Attending: Emergency Medicine | Admitting: Emergency Medicine

## 2022-12-07 ENCOUNTER — Other Ambulatory Visit: Payer: Self-pay

## 2022-12-07 DIAGNOSIS — Z951 Presence of aortocoronary bypass graft: Secondary | ICD-10-CM | POA: Insufficient documentation

## 2022-12-07 DIAGNOSIS — E119 Type 2 diabetes mellitus without complications: Secondary | ICD-10-CM | POA: Insufficient documentation

## 2022-12-07 DIAGNOSIS — I4891 Unspecified atrial fibrillation: Secondary | ICD-10-CM | POA: Insufficient documentation

## 2022-12-07 DIAGNOSIS — R079 Chest pain, unspecified: Secondary | ICD-10-CM | POA: Diagnosis not present

## 2022-12-07 DIAGNOSIS — I2510 Atherosclerotic heart disease of native coronary artery without angina pectoris: Secondary | ICD-10-CM | POA: Diagnosis not present

## 2022-12-07 DIAGNOSIS — I771 Stricture of artery: Secondary | ICD-10-CM | POA: Diagnosis not present

## 2022-12-07 DIAGNOSIS — R0789 Other chest pain: Secondary | ICD-10-CM | POA: Diagnosis not present

## 2022-12-07 LAB — BASIC METABOLIC PANEL
Anion gap: 9 (ref 5–15)
BUN: 24 mg/dL — ABNORMAL HIGH (ref 8–23)
CO2: 17 mmol/L — ABNORMAL LOW (ref 22–32)
Calcium: 9.6 mg/dL (ref 8.9–10.3)
Chloride: 110 mmol/L (ref 98–111)
Creatinine, Ser: 1.46 mg/dL — ABNORMAL HIGH (ref 0.61–1.24)
GFR, Estimated: 53 mL/min — ABNORMAL LOW (ref >60)
Glucose, Bld: 97 mg/dL (ref 70–99)
Potassium: 4.1 mmol/L (ref 3.5–5.1)
Sodium: 136 mmol/L (ref 135–145)

## 2022-12-07 LAB — CBC
HCT: 40.5 % (ref 39.0–52.0)
Hemoglobin: 13.5 g/dL (ref 13.0–17.0)
MCH: 32.3 pg (ref 26.0–34.0)
MCHC: 33.3 g/dL (ref 30.0–36.0)
MCV: 96.9 fL (ref 80.0–100.0)
Platelets: 288 10*3/uL (ref 150–400)
RBC: 4.18 MIL/uL — ABNORMAL LOW (ref 4.22–5.81)
RDW: 13.2 % (ref 11.5–15.5)
WBC: 6.3 10*3/uL (ref 4.0–10.5)
nRBC: 0 % (ref 0.0–0.2)

## 2022-12-07 LAB — TROPONIN I (HIGH SENSITIVITY): Troponin I (High Sensitivity): 38 ng/L — ABNORMAL HIGH (ref <18)

## 2022-12-07 LAB — PROTIME-INR
INR: 2.2 — ABNORMAL HIGH (ref 0.8–1.2)
Prothrombin Time: 24.6 seconds — ABNORMAL HIGH (ref 11.4–15.2)

## 2022-12-07 MED ORDER — BACITRACIN ZINC 500 UNIT/GM EX OINT
TOPICAL_OINTMENT | Freq: Once | CUTANEOUS | Status: AC
  Filled 2022-12-07: qty 0.9

## 2022-12-07 NOTE — Discharge Instructions (Signed)
Return to the ER immediately for new, worsening, or persistent severe chest pain, shortness of breath, palpitations, weakness or lightheadedness, or any other new or worsening symptoms that concern you.  Follow-up with your primary care provider and cardiologist.

## 2022-12-07 NOTE — ED Provider Notes (Signed)
[facility] Provider Note    Event Date/Time   First MD Initiated Contact with Patient 12/07/22 1102     (approximate)   History   Chest Pain   HPI  Jeremy [name] is a 67 y.o. male with a history of CAD s/p CABG, HFrEF, atrial fibrillation, type 2 diabetes, hyperlipidemia, OSA, and polysubstance abuse who presents with an episode of chest pain this morning described as tightness, lasting for less than an hour and now resolved.  He also felt some shortness of breath at that time.  He denies feeling dizzy or lightheaded.  He has no nausea or vomiting.  He has had similar episodes before.  He states he feels fine now just wants to eat.  He states he has been compliant with his medications although he did not take his nitroglycerin with him so could not take it when he had the chest pain.  I reviewed the past medical records.  The patient was admitted earlier this month.  Per the hospitalist discharge summary from 6/14 he presented with chest pain and had an NSTEMI.  He was on heparin for 48 hours but did not require catheterization due to troponin staying flat in the 100s.   Physical Exam   Triage Vital Signs: ED Triage Vitals  Enc Vitals Group     BP 12/07/22 0954 (!) 134/91     Pulse Rate 12/07/22 0954 (!) 114     Resp 12/07/22 0954 18     Temp 12/07/22 0954 97.8 F (36.6 C)     Temp Source 12/07/22 0954 Oral     SpO2 12/07/22 0954 100 %     Weight 12/07/22 0954 240 lb (108.9 kg)     Height 12/07/22 0954 5\' 11"  (1.803 m)     Head Circumference --      Peak Flow --      Pain Score 12/07/22 0953 0     Pain Loc --      Pain Edu? --      Excl. in GC? --     Most recent vital signs: Vitals:   12/07/22 0954  BP: (!) 134/91  Pulse: (!) 114  Resp: 18  Temp: 97.8 F (36.6 C)  SpO2: 100%     General: Alert, well-appearing, no distress.  CV:  Good peripheral perfusion.  Normal heart sounds. Resp:  Normal effort.  Coarse breath sounds  bilaterally. Abd:  No distention.  Other:  Trace bilateral lower extremity edema.   ED Results / Procedures / Treatments   Labs (all labs ordered are listed, but only abnormal results are displayed) Labs Reviewed  BASIC METABOLIC PANEL - Abnormal; Notable for the following components:      Result Value   CO2 17 (*)    BUN 24 (*)    Creatinine, Ser 1.46 (*)    GFR, Estimated 53 (*)    All other components within normal limits  CBC - Abnormal; Notable for the following components:   RBC 4.18 (*)    All other components within normal limits  PROTIME-INR - Abnormal; Notable for the following components:   Prothrombin Time 24.6 (*)    INR 2.2 (*)    All other components within normal limits  TROPONIN I (HIGH SENSITIVITY) - Abnormal; Notable for the following components:   Troponin I (High Sensitivity) 38 (*)    All other components within normal limits  TROPONIN I (HIGH SENSITIVITY)     EKG  ED ECG REPORT  IDionne Bucy, the attending physician, personally viewed and interpreted this ECG.  Date: 12/07/2022 EKG Time: 0956 Rate: 104 Rhythm: Atrial fibrillation QRS Axis: normal Intervals: normal ST/T Wave abnormalities: Deep T wave inversions in inferior and lateral leads Narrative Interpretation: no evidence of acute ischemia; no significant change when compared to EKG of 6/23    RADIOLOGY  Chest x-ray: I independently viewed and interpreted the images; there is no focal consolidation or edema  PROCEDURES:  Critical Care performed: No  Procedures   MEDICATIONS ORDERED IN ED: Medications  bacitracin ointment ( Topical Given 12/07/22 1308)     IMPRESSION / MDM / ASSESSMENT AND PLAN / ED COURSE  I reviewed the triage vital signs and the nursing notes.  67 year old male with PMH as noted above presents with an episode of chest pain and associated with shortness of breath which has now resolved.  The patient is asymptomatic currently.  On exam he has  borderline tachycardic in atrial fibrillation with otherwise normal vital signs.  Exam is otherwise unremarkable for acute findings.  EKG shows deep T wave inversions which are unchanged from prior.  Differential diagnosis includes, but is not limited to, stable angina, ACS, rapid atrial fibrillation, GERD, musculoskeletal pain.  There is no clinical evidence for PE or for vascular etiology given that the symptoms have resolved.  Initial troponin is 38 which is consistent with his recent baseline.  INR is 2.2.  We will obtain a repeat troponin and reassess.  Patient's presentation is most consistent with acute complicated illness / injury requiring diagnostic workup.  ----------------------------------------- 1:12 PM on 12/07/2022 -----------------------------------------  The repeat troponin was sent but the patient no longer wants to wait.  She has remained without any chest pain or other symptoms throughout his ED stay.  He appears well and is ambulating without difficulty.  The patient understands that without the repeat troponin I cannot fully rule out heart attack or other acute cardiac issues that could cause permanent disability or death.  He demonstrates appropriate decision-making capacity.  He also understands he may return at any time if he changes his mind or has worsening symptoms.  I gave strict return precautions and he expresses understanding.  He agrees to follow-up with his primary care provider and cardiologist.   FINAL CLINICAL IMPRESSION(S) / ED DIAGNOSES   Final diagnoses:  Chest pain, unspecified type     Rx / DC Orders   ED Discharge Orders     None        Note:  This document was prepared using Dragon voice recognition software and may include unintentional dictation errors.    Dionne Bucy, MD 12/07/22 1313

## 2022-12-07 NOTE — ED Triage Notes (Signed)
Patient to ED via [facility] from court for chest pain. Patient states he was walking when the pain started. Pain has resolved at this time. Given 324 of aspirin by EMS. Also stating he has some SOB.

## 2022-12-10 ENCOUNTER — Emergency Department
Admission: EM | Admit: 2022-12-10 | Discharge: 2022-12-10 | Disposition: A | Discharge status: Home / Self Care | Payer: Medicare HMO | Attending: Emergency Medicine | Admitting: Emergency Medicine

## 2022-12-10 ENCOUNTER — Emergency Department: Payer: Medicare HMO

## 2022-12-10 ENCOUNTER — Other Ambulatory Visit: Payer: Self-pay

## 2022-12-10 DIAGNOSIS — I4891 Unspecified atrial fibrillation: Secondary | ICD-10-CM | POA: Diagnosis not present

## 2022-12-10 DIAGNOSIS — I25118 Atherosclerotic heart disease of native coronary artery with other forms of angina pectoris: Secondary | ICD-10-CM | POA: Diagnosis not present

## 2022-12-10 DIAGNOSIS — R0789 Other chest pain: Secondary | ICD-10-CM | POA: Diagnosis not present

## 2022-12-10 DIAGNOSIS — I2089 Other forms of angina pectoris: Secondary | ICD-10-CM

## 2022-12-10 DIAGNOSIS — R079 Chest pain, unspecified: Secondary | ICD-10-CM | POA: Diagnosis not present

## 2022-12-10 DIAGNOSIS — I209 Angina pectoris, unspecified: Secondary | ICD-10-CM | POA: Diagnosis not present

## 2022-12-10 LAB — BASIC METABOLIC PANEL
Anion gap: 10 (ref 5–15)
BUN: 21 mg/dL (ref 8–23)
CO2: 16 mmol/L — ABNORMAL LOW (ref 22–32)
Calcium: 9.8 mg/dL (ref 8.9–10.3)
Chloride: 112 mmol/L — ABNORMAL HIGH (ref 98–111)
Creatinine, Ser: 1.43 mg/dL — ABNORMAL HIGH (ref 0.61–1.24)
GFR, Estimated: 54 mL/min — ABNORMAL LOW (ref >60)
Glucose, Bld: 91 mg/dL (ref 70–99)
Potassium: 4 mmol/L (ref 3.5–5.1)
Sodium: 138 mmol/L (ref 135–145)

## 2022-12-10 LAB — CBC WITH DIFFERENTIAL/PLATELET
Abs Immature Granulocytes: 0.01 10*3/uL (ref 0.00–0.07)
Basophils Absolute: 0 10*3/uL (ref 0.0–0.1)
Basophils Relative: 1 %
Eosinophils Absolute: 0.2 10*3/uL (ref 0.0–0.5)
Eosinophils Relative: 4 %
HCT: 38 % — ABNORMAL LOW (ref 39.0–52.0)
Hemoglobin: 13 g/dL (ref 13.0–17.0)
Immature Granulocytes: 0 %
Lymphocytes Relative: 39 %
Lymphs Abs: 2.1 10*3/uL (ref 0.7–4.0)
MCH: 32.6 pg (ref 26.0–34.0)
MCHC: 34.2 g/dL (ref 30.0–36.0)
MCV: 95.2 fL (ref 80.0–100.0)
Monocytes Absolute: 0.6 10*3/uL (ref 0.1–1.0)
Monocytes Relative: 11 %
Neutro Abs: 2.5 10*3/uL (ref 1.7–7.7)
Neutrophils Relative %: 45 %
Platelets: 284 10*3/uL (ref 150–400)
RBC: 3.99 MIL/uL — ABNORMAL LOW (ref 4.22–5.81)
RDW: 13.2 % (ref 11.5–15.5)
WBC: 5.4 10*3/uL (ref 4.0–10.5)
nRBC: 0 % (ref 0.0–0.2)

## 2022-12-10 LAB — TROPONIN I (HIGH SENSITIVITY)
Troponin I (High Sensitivity): 47 ng/L — ABNORMAL HIGH (ref <18)
Troponin I (High Sensitivity): 47 ng/L — ABNORMAL HIGH (ref <18)

## 2022-12-10 MED ORDER — SODIUM CHLORIDE 0.9 % IV BOLUS
1000.0000 mL | Freq: Once | INTRAVENOUS | Status: AC
  Administered 2022-12-10: 1000 mL via INTRAVENOUS

## 2022-12-10 NOTE — ED Triage Notes (Signed)
Pt arrives via [facility] with CC of chest pain that began this afternoon. EMS gave 324 Aspirin and 2 doses of sublingual nitroglycerin and pt states pain has dissipated. Pt has hx of recent NSTEMI.

## 2022-12-10 NOTE — ED Notes (Signed)
Pt given [place] sandwich tray per request - first thing requested upon arrival. Pt eating at this time. Upon reviewing discharge instructions pt is agitated and reports that he does not have a ride home.

## 2022-12-10 NOTE — Discharge Instructions (Signed)
Your tests in the emergency department were reassuring.  Continue taking all of your medications as prescribed.  Take the nitroglycerin that was prescribed to you as needed for chest pain.

## 2022-12-10 NOTE — ED Provider Notes (Signed)
[facility] Provider Note    Event Date/Time   First MD Initiated Contact with Patient 12/10/22 1931     (approximate)   History   Chief Complaint: Chest Pain   HPI  Jeremy [name] is a 67 y.o. male with a history of obesity, CAD status post NSTEMI 3 weeks ago who comes to the ED complaining of chest pain that started this afternoon.  It is in the center of the chest, sharp/stabbing, nonradiating.  No shortness of breath diaphoresis or vomiting.  Not exertional, not pleuritic.  Patient reports that things have not been going well ever since he was last hospitalized 3 weeks ago because he and his partner of 22 years broke up and she left him to date a woman, and he reports that that causes him significant stress.  Notably, patient's first comments on arrival, before transferring from EMS stretcher to ED stretcher, is to ask for a sandwich with mayonnaise and mustard and potato chips.  EMS report they gave the patient 324 of aspirin, 2 nitroglycerin after which time pain was resolved.     Physical Exam   Triage Vital Signs: ED Triage Vitals [12/10/22 1930]  Enc Vitals Group     BP      Pulse      Resp      Temp      Temp src      SpO2      Weight 244 lb (110.7 kg)     Height 5\' 11"  (1.803 m)     Head Circumference      Peak Flow      Pain Score 2     Pain Loc      Pain Edu?      Excl. in GC?     Most recent vital signs: Vitals:   12/10/22 1932 12/10/22 1942  BP: 108/62   Pulse: 93 89  Resp:  (!) 23  Temp:  98.8 F (37.1 C)  SpO2: 100% 99%    General: Awake, no distress.  CV:  Good peripheral perfusion.  Regular rate and rhythm Resp:  Normal effort.  Clear to auscultation bilaterally Abd:  No distention.  Soft nontender Other:  No lower extremity edema   ED Results / Procedures / Treatments   Labs (all labs ordered are listed, but only abnormal results are displayed) Labs Reviewed  BASIC METABOLIC PANEL - Abnormal; Notable for  the following components:      Result Value   Chloride 112 (*)    CO2 16 (*)    Creatinine, Ser 1.43 (*)    GFR, Estimated 54 (*)    All other components within normal limits  CBC WITH DIFFERENTIAL/PLATELET - Abnormal; Notable for the following components:   RBC 3.99 (*)    HCT 38.0 (*)    All other components within normal limits  TROPONIN I (HIGH SENSITIVITY) - Abnormal; Notable for the following components:   Troponin I (High Sensitivity) 47 (*)    All other components within normal limits  TROPONIN I (HIGH SENSITIVITY) - Abnormal; Notable for the following components:   Troponin I (High Sensitivity) 47 (*)    All other components within normal limits     EKG Interpreted by me Atrial flutter rate of 89.  Normal axis, normal intervals.  Left bundle branch block.  No acute ischemic changes.   RADIOLOGY Chest x-ray interpreted by me, appears unremarkable.  Radiology report reviewed   PROCEDURES:  Procedures   MEDICATIONS  ORDERED IN ED: Medications  sodium chloride 0.9 % bolus 1,000 mL (1,000 mLs Intravenous New Bag/Given 12/10/22 1939)     IMPRESSION / MDM / ASSESSMENT AND PLAN / ED COURSE  I reviewed the triage vital signs and the nursing notes.  DDx: GERD, muscle spasm, NSTEMI, AKI, electrolyte abnormality  Patient's presentation is most consistent with acute presentation with potential threat to life or bodily function.  Patient presents with nonspecific chest pain.  Due to age, comorbidities, will need to obtain troponins and chest x-ray.   ----------------------------------------- 10:07 PM on 12/10/2022 ----------------------------------------- Workup including serial troponins unremarkable.  Pain is resolved, back to baseline.  He has previously been prescribed nitroglycerin, Xarelto, amiodarone, metoprolol, Imdur.  I think his presentation is due to angina pectoralis and not ACS.  Encouraged him to continue taking all of his medications as prescribed  including as needed nitroglycerin.  Stable for discharge, advised to follow-up with his doctor and return to ED if any worsening symptoms or new concerns.      FINAL CLINICAL IMPRESSION(S) / ED DIAGNOSES   Final diagnoses:  Stable angina pectoris     Rx / DC Orders   ED Discharge Orders     None        Note:  This document was prepared using Dragon voice recognition software and may include unintentional dictation errors.   Sharman Cheek, MD 12/10/22 2208

## 2022-12-10 NOTE — ED Notes (Signed)
Pt transported to lobby and given two warm blankets after finishing [place] sandwich tray.

## 2022-12-11 ENCOUNTER — Other Ambulatory Visit: Payer: Self-pay

## 2022-12-11 ENCOUNTER — Encounter: Payer: Self-pay | Admitting: Emergency Medicine

## 2022-12-11 ENCOUNTER — Emergency Department
Admission: EM | Admit: 2022-12-11 | Discharge: 2022-12-11 | Payer: Medicare HMO | Attending: Emergency Medicine | Admitting: Emergency Medicine

## 2022-12-11 DIAGNOSIS — I2510 Atherosclerotic heart disease of native coronary artery without angina pectoris: Secondary | ICD-10-CM | POA: Diagnosis not present

## 2022-12-11 DIAGNOSIS — I10 Essential (primary) hypertension: Secondary | ICD-10-CM | POA: Diagnosis not present

## 2022-12-11 DIAGNOSIS — R0789 Other chest pain: Secondary | ICD-10-CM | POA: Diagnosis not present

## 2022-12-11 DIAGNOSIS — Z951 Presence of aortocoronary bypass graft: Secondary | ICD-10-CM | POA: Diagnosis not present

## 2022-12-11 DIAGNOSIS — R0602 Shortness of breath: Secondary | ICD-10-CM | POA: Insufficient documentation

## 2022-12-11 DIAGNOSIS — E119 Type 2 diabetes mellitus without complications: Secondary | ICD-10-CM | POA: Insufficient documentation

## 2022-12-11 DIAGNOSIS — R079 Chest pain, unspecified: Secondary | ICD-10-CM | POA: Diagnosis present

## 2022-12-11 DIAGNOSIS — R002 Palpitations: Secondary | ICD-10-CM | POA: Diagnosis not present

## 2022-12-11 LAB — CBC
HCT: 39.5 % (ref 39.0–52.0)
Hemoglobin: 13.3 g/dL (ref 13.0–17.0)
MCH: 32.1 pg (ref 26.0–34.0)
MCHC: 33.7 g/dL (ref 30.0–36.0)
MCV: 95.4 fL (ref 80.0–100.0)
Platelets: 284 10*3/uL (ref 150–400)
RBC: 4.14 MIL/uL — ABNORMAL LOW (ref 4.22–5.81)
RDW: 13.2 % (ref 11.5–15.5)
WBC: 5.7 10*3/uL (ref 4.0–10.5)
nRBC: 0 % (ref 0.0–0.2)

## 2022-12-11 LAB — COMPREHENSIVE METABOLIC PANEL
ALT: 18 U/L (ref 0–44)
AST: 25 U/L (ref 15–41)
Albumin: 4 g/dL (ref 3.5–5.0)
Alkaline Phosphatase: 61 U/L (ref 38–126)
Anion gap: 7 (ref 5–15)
BUN: 22 mg/dL (ref 8–23)
CO2: 20 mmol/L — ABNORMAL LOW (ref 22–32)
Calcium: 9.8 mg/dL (ref 8.9–10.3)
Chloride: 114 mmol/L — ABNORMAL HIGH (ref 98–111)
Creatinine, Ser: 1.5 mg/dL — ABNORMAL HIGH (ref 0.61–1.24)
GFR, Estimated: 51 mL/min — ABNORMAL LOW (ref >60)
Glucose, Bld: 118 mg/dL — ABNORMAL HIGH (ref 70–99)
Potassium: 4.4 mmol/L (ref 3.5–5.1)
Sodium: 141 mmol/L (ref 135–145)
Total Bilirubin: 0.6 mg/dL (ref 0.3–1.2)
Total Protein: 7.1 g/dL (ref 6.5–8.1)

## 2022-12-11 LAB — BRAIN NATRIURETIC PEPTIDE: B Natriuretic Peptide: 92 pg/mL (ref 0.0–100.0)

## 2022-12-11 LAB — TROPONIN I (HIGH SENSITIVITY): Troponin I (High Sensitivity): 29 ng/L — ABNORMAL HIGH (ref <18)

## 2022-12-11 MED ORDER — ACETAMINOPHEN 500 MG PO TABS: 1000.0000 mg | ORAL_TABLET | Freq: Three times a day (TID) | ORAL | 0 refills | Status: DC | PRN

## 2022-12-11 MED ORDER — CLINDAMYCIN HCL 300 MG PO CAPS: 300.0000 mg | ORAL_CAPSULE | Freq: Three times a day (TID) | ORAL | 0 refills | Status: AC

## 2022-12-11 NOTE — ED Triage Notes (Signed)
Pt via [employer] from home. Pt is medical clearance for jail. Pt states he had some palpitations and SOB starting today. Recently for the same when he needed to go to court. Pt is A&OX4 and NAD, ambulatory to triage.

## 2022-12-11 NOTE — ED Provider Notes (Addendum)
[facility] Provider Note    Event Date/Time   First MD Initiated Contact with Patient 12/11/22 1152     (approximate)  History   Chief Complaint: Chest Pain and Shortness of Breath  HPI  Jeremy [name] is a 67 y.o. male with a past medical history of CAD, diabetes, hypertension, CABG, presents to the emergency department for tachycardia and chest pain.  According to the patient for the past few days he has been experiencing pain in the chest and palpitations.  Patient was brought by police officers for medical clearance for jail.  Patient is somewhat tachycardic currently 120 bpm with a widened complex morphology however unchanged from prior EKG.  Patient states for the last for 5 days he has been experiencing intermittent palpitations and some pain in the left side of his chest at times.  Overall patient appears well, no distress lying in bed.  Patient states he was seen recently for the same.  I reviewed the patient's visit from 12/07/2022 showing overall reassuring chemistry slight troponin elevation but largely unchanged from historical values.  Reassuring CBC.  Physical Exam   Triage Vital Signs: ED Triage Vitals  Enc Vitals Group     BP 12/11/22 1144 (!) 142/83     Pulse Rate 12/11/22 1144 (!) 119     Resp 12/11/22 1144 18     Temp --      Temp src --      SpO2 12/11/22 1144 96 %     Weight 12/11/22 1143 244 lb (110.7 kg)     Height 12/11/22 1143 5\' 11"  (1.803 m)     Head Circumference --      Peak Flow --      Pain Score 12/11/22 1143 8     Pain Loc --      Pain Edu? --      Excl. in GC? --     Most recent vital signs: Vitals:   12/11/22 1144  BP: (!) 142/83  Pulse: (!) 119  Resp: 18  SpO2: 96%    General: Awake, no distress.  CV:  Good peripheral perfusion.  Regular rate and rhythm around 100 bpm. Resp:  Normal effort.  Equal breath sounds bilaterally.  Abd:  No distention.  Soft, nontender.  No rebound or guarding.  ED Results /  Procedures / Treatments   EKG  EKG viewed and interpreted by myself shows sinus tachycardia 119 bpm with a slightly widened QRS, normal axis, slight QTc prolongation otherwise normal intervals, nonspecific ST changes with inferolateral T wave inversions however largely unchanged from past EKG on my review.  MEDICATIONS ORDERED IN ED: Medications - No data to display   IMPRESSION / MDM / ASSESSMENT AND PLAN / ED COURSE  I reviewed the triage vital signs and the nursing notes.  Patient's presentation is most consistent with acute presentation with potential threat to life or bodily function.  Patient presents to the emergency department for palpitations and chest pain, states he was seen for the same recently with a negative workup.  Patient was brought by police officers for medical clearance for jail due to the symptoms.  Overall patient appears well, no distress he is mildly tachycardic at 100 to 120 bpm.  Currently during my evaluation patient's heart rate is maintaining around 90 bpm.  As a precaution we will recheck labs including CBC chemistry troponin and BNP.  Patient is agreeable to plan of care.  States he does not want a chest  x-ray.  Patient's BNP is normal, CBC is normal, chemistry shows no significant findings.  Troponin at 29 which is consistent with historical values.  Given the patient's reassuring workup now reassuring vitals pulse rate has normalized around 85 bpm.  We will discharge patient into police custody.  Upon discharge patient also wanted me to evaluate an ulceration to the lateral aspect of his right ankle which she states has been present for several years.  He has followed up with wound management but states that has been starting to drain once again.  On my evaluation possible slight infection although no significant surrounding cellulitis or other findings.  We will discharge on clindamycin and Tylenol to be used as needed.  Patient agreeable to plan of care.   Patient states he had been following up with wound management.  FINAL CLINICAL IMPRESSION(S) / ED DIAGNOSES   Chest pain   Note:  This document was prepared using Dragon voice recognition software and may include unintentional dictation errors.   [name], MD 12/11/22 1337    [name], MD 12/11/22 1351

## 2022-12-11 NOTE — Discharge Instructions (Addendum)
Please take antibiotics as prescribed for their entire course for your right foot ulceration.  Please take Tylenol as needed for discomfort as written.  Please follow-up with wound management when you are able.

## 2022-12-13 ENCOUNTER — Encounter: Payer: Self-pay | Admitting: Emergency Medicine

## 2022-12-28 ENCOUNTER — Emergency Department: Payer: Medicare HMO

## 2022-12-28 ENCOUNTER — Emergency Department
Admission: EM | Admit: 2022-12-28 | Discharge: 2022-12-28 | Disposition: A | Discharge status: Home / Self Care | Payer: Medicare HMO | Attending: Emergency Medicine | Admitting: Emergency Medicine

## 2022-12-28 ENCOUNTER — Other Ambulatory Visit: Payer: Self-pay

## 2022-12-28 DIAGNOSIS — E119 Type 2 diabetes mellitus without complications: Secondary | ICD-10-CM | POA: Insufficient documentation

## 2022-12-28 DIAGNOSIS — R0602 Shortness of breath: Secondary | ICD-10-CM | POA: Insufficient documentation

## 2022-12-28 DIAGNOSIS — I700 Atherosclerosis of aorta: Secondary | ICD-10-CM | POA: Diagnosis not present

## 2022-12-28 DIAGNOSIS — I509 Heart failure, unspecified: Secondary | ICD-10-CM | POA: Diagnosis not present

## 2022-12-28 DIAGNOSIS — I4891 Unspecified atrial fibrillation: Secondary | ICD-10-CM | POA: Diagnosis not present

## 2022-12-28 DIAGNOSIS — R0789 Other chest pain: Secondary | ICD-10-CM | POA: Diagnosis not present

## 2022-12-28 DIAGNOSIS — I2510 Atherosclerotic heart disease of native coronary artery without angina pectoris: Secondary | ICD-10-CM | POA: Diagnosis not present

## 2022-12-28 DIAGNOSIS — R071 Chest pain on breathing: Secondary | ICD-10-CM | POA: Diagnosis not present

## 2022-12-28 DIAGNOSIS — I517 Cardiomegaly: Secondary | ICD-10-CM | POA: Diagnosis not present

## 2022-12-28 DIAGNOSIS — R079 Chest pain, unspecified: Secondary | ICD-10-CM | POA: Diagnosis not present

## 2022-12-28 LAB — BASIC METABOLIC PANEL
Anion gap: 8 (ref 5–15)
BUN: 14 mg/dL (ref 8–23)
CO2: 21 mmol/L — ABNORMAL LOW (ref 22–32)
Calcium: 9.4 mg/dL (ref 8.9–10.3)
Chloride: 109 mmol/L (ref 98–111)
Creatinine, Ser: 1.18 mg/dL (ref 0.61–1.24)
GFR, Estimated: >60 mL/min (ref >60)
Glucose, Bld: 122 mg/dL — ABNORMAL HIGH (ref 70–99)
Potassium: 3.7 mmol/L (ref 3.5–5.1)
Sodium: 138 mmol/L (ref 135–145)

## 2022-12-28 LAB — CBC
HCT: 38.7 % — ABNORMAL LOW (ref 39.0–52.0)
Hemoglobin: 12.9 g/dL — ABNORMAL LOW (ref 13.0–17.0)
MCH: 32.3 pg (ref 26.0–34.0)
MCHC: 33.3 g/dL (ref 30.0–36.0)
MCV: 97 fL (ref 80.0–100.0)
Platelets: 274 10*3/uL (ref 150–400)
RBC: 3.99 MIL/uL — ABNORMAL LOW (ref 4.22–5.81)
RDW: 13.7 % (ref 11.5–15.5)
WBC: 4.5 10*3/uL (ref 4.0–10.5)
nRBC: 0 % (ref 0.0–0.2)

## 2022-12-28 LAB — TROPONIN I (HIGH SENSITIVITY): Troponin I (High Sensitivity): 14 ng/L (ref <18)

## 2022-12-28 LAB — BRAIN NATRIURETIC PEPTIDE: B Natriuretic Peptide: 9.8 pg/mL (ref 0.0–100.0)

## 2022-12-28 LAB — APTT: aPTT: 39 seconds — ABNORMAL HIGH (ref 24–36)

## 2022-12-28 NOTE — ED Provider Notes (Signed)
[facility] Provider Note  Patient Contact: 4:59 PM (approximate)   History   Chest Pain   HPI  Jeremy [name] is a 67 y.o. male who presents the emergency department complaining of shortness of breath.  Patient states that he has had shortness of breath for a week.  States that he is having central chest pain.  No radiation to his back, down his arm.  Patient has a history of CHF, illicit substance use, NSTEMI, STEMI, A-fib, congestive heart failure, coronary artery disease.  Patient is also diabetic and is currently being followed at the wound care center for a wound to his right ankle.  No increased edema, pain to the ankle.  No calf pain.  Patient denies any fevers, chills, cough.  In the last month this will be the patient's seventh encounter for chest pain.  Denies GI symptoms, urinary symptoms.  Does not regularly weigh himself and is unsure of any weight loss or gain.     Physical Exam   Triage Vital Signs: ED Triage Vitals  Encounter Vitals Group     BP 12/28/22 1443 (!) 145/83     Systolic BP Percentile --      Diastolic BP Percentile --      Pulse Rate 12/28/22 1443 100     Resp 12/28/22 1443 18     Temp 12/28/22 1443 (!) 97.5 F (36.4 C)     Temp Source 12/28/22 1443 Oral     SpO2 12/28/22 1443 100 %     Weight 12/28/22 1439 244 lb 0.8 oz (110.7 kg)     Height 12/28/22 1439 5\' 11"  (1.803 m)     Head Circumference --      Peak Flow --      Pain Score 12/28/22 1439 8     Pain Loc --      Pain Education --      Exclude from Growth Chart --     Most recent vital signs: Vitals:   12/28/22 1700 12/28/22 1715  BP: 114/79   Pulse: 77   Resp:  (!) 21  Temp:    SpO2: 96%      General: Alert and in no acute distress.   Cardiovascular:  Good peripheral perfusion.  Normal S1 and S2 Respiratory: Normal respiratory effort without tachypnea or retractions. Lungs CTAB. Good air entry to the bases with no decreased or absent breath  sounds. Gastrointestinal: Bowel sounds 4 quadrants. Soft and nontender to palpation. No guarding or rigidity. No palpable masses. No distention. No CVA tenderness. Musculoskeletal: Full range of motion to all extremities.  Chronic appearing wound to the right ankle with no gross surrounding erythema, edema.  No warmth to palpation.  No tenderness over the calf or edema of the calf. Neurologic:  No gross focal neurologic deficits are appreciated.  Skin:   No rash noted Other:   ED Results / Procedures / Treatments   Labs (all labs ordered are listed, but only abnormal results are displayed) Labs Reviewed  BASIC METABOLIC PANEL - Abnormal; Notable for the following components:      Result Value   CO2 21 (*)    Glucose, Bld 122 (*)    All other components within normal limits  CBC - Abnormal; Notable for the following components:   RBC 3.99 (*)    Hemoglobin 12.9 (*)    HCT 38.7 (*)    All other components within normal limits  APTT - Abnormal; Notable for the following  components:   aPTT 39 (*)    All other components within normal limits  BRAIN NATRIURETIC PEPTIDE  TROPONIN I (HIGH SENSITIVITY)     EKG  ED ECG REPORT I, Delorise Royals Kailin Leu,  personally viewed and interpreted this ECG.   Date: 12/28/2022  EKG Time: 1441 hrs.  Rate: 89 bpm  Rhythm: normal EKG, normal sinus rhythm with sinus arrhythmia, unchanged from previous tracings, normal sinus rhythm, 1st degree AV block, ST and T wave changes in the inferior, anterolateral leads.  Compared to previous EKG, largely unchanged with the exception at PR interval is increased slightly into first-degree AV block.  Axis: Normal  Intervals:first-degree A-V block   ST&T Change: Diffuse ST and T wave changes in the inferior and anterolateral leads.  Compared to previous EKG no significant changes from baseline.  Multiple recent EKGs show similar findings.  Normal sinus rhythm with sinus arrhythmia, first-degree AV block.,   Diffuse ST and T wave changes which have been present for some time and largely unchanged.  No STEMI.    RADIOLOGY  I personally viewed, evaluated, and interpreted these images as part of my medical decision making, as well as reviewing the written report by the radiologist.  ED Provider Interpretation: Cardiomegaly but no acute intrathoracic abnormality.  DG Chest 2 View  Result Date: 12/28/2022 CLINICAL DATA:  Chest pain short of breath EXAM: CHEST - 2 VIEW COMPARISON:  12/07/2022 FINDINGS: Post sternotomy changes. Mild cardiomegaly. No acute airspace disease or effusion. Aortic atherosclerosis. No pneumothorax IMPRESSION: Mild cardiomegaly. Electronically Signed   By: Jasmine Pang M.D.   On: 12/28/2022 15:51    PROCEDURES:  Critical Care performed: No  Procedures   MEDICATIONS ORDERED IN ED: Medications - No data to display   IMPRESSION / MDM / ASSESSMENT AND PLAN / ED COURSE  I reviewed the triage vital signs and the nursing notes.                                 Differential diagnosis includes, but is not limited to, CHF exacerbation, pneumonia, STEMI/ACS, PE  The patient is on the cardiac monitor to evaluate for evidence of arrhythmia and/or significant heart rate changes.  Patient's presentation is most consistent with acute presentation with potential threat to life or bodily function.   Patient's diagnosis is consistent with nonspecific chest pain.  Patient presents emergency department complaint chest pain shortness of breath.  Patient has been here 7 times in the last month for similar symptoms.  Patient is homeless and states that being outside is caused him to be short of breath when it is superhot.  Patient had reassuring vital signs while here in the emergency department.  Workup is either at patient's baseline or slightly better than baseline.  He does have EKG changes but again looking at his last 7 EKGs over the last month there is no significant changes.   Patient does have a chronic appearing wound on his right ankle but there is no surrounding erythema, edema and no pain.  There is no evidence of calf edema or erythema to concern for DVT.  At this time with reassuring workup patient is stable for discharge.  Concerning signs and symptoms and return precautions discussed with the patient..  Patient is given ED precautions to return to the ED for any worsening or new symptoms.     FINAL CLINICAL IMPRESSION(S) / ED DIAGNOSES   Final diagnoses:  Nonspecific chest pain     Rx / DC Orders   ED Discharge Orders     None        Note:  This document was prepared using Dragon voice recognition software and may include unintentional dictation errors.   Lanette Hampshire 12/28/22 1921    Trinna Post, MD 12/28/22 2008

## 2022-12-28 NOTE — ED Notes (Signed)
First nurse note: Pt here from home a via [facility] from home with c/o of CP that started 2 hours ago.    CBG 116 98.0 HR: 90 99% 135/68  324 ASA

## 2022-12-28 NOTE — ED Notes (Signed)
Pt refused 2nd troponin. MD aware.

## 2022-12-28 NOTE — ED Triage Notes (Signed)
Pt here with central cp. Pt states pain is intermittent and is sharp in nature. Pt talking in complete sentences. Pt denies NVD.

## 2023-01-04 ENCOUNTER — Other Ambulatory Visit: Payer: Self-pay

## 2023-01-04 ENCOUNTER — Emergency Department
Admission: EM | Admit: 2023-01-04 | Discharge: 2023-01-04 | Disposition: A | Discharge status: Home / Self Care | Payer: Medicare HMO | Attending: Emergency Medicine | Admitting: Emergency Medicine

## 2023-01-04 ENCOUNTER — Emergency Department: Payer: Medicare HMO

## 2023-01-04 DIAGNOSIS — E119 Type 2 diabetes mellitus without complications: Secondary | ICD-10-CM | POA: Insufficient documentation

## 2023-01-04 DIAGNOSIS — I2510 Atherosclerotic heart disease of native coronary artery without angina pectoris: Secondary | ICD-10-CM | POA: Diagnosis not present

## 2023-01-04 DIAGNOSIS — I10 Essential (primary) hypertension: Secondary | ICD-10-CM | POA: Diagnosis not present

## 2023-01-04 DIAGNOSIS — R0789 Other chest pain: Secondary | ICD-10-CM | POA: Insufficient documentation

## 2023-01-04 DIAGNOSIS — R079 Chest pain, unspecified: Secondary | ICD-10-CM

## 2023-01-04 DIAGNOSIS — Z951 Presence of aortocoronary bypass graft: Secondary | ICD-10-CM | POA: Insufficient documentation

## 2023-01-04 DIAGNOSIS — I771 Stricture of artery: Secondary | ICD-10-CM | POA: Diagnosis not present

## 2023-01-04 DIAGNOSIS — I4891 Unspecified atrial fibrillation: Secondary | ICD-10-CM | POA: Diagnosis not present

## 2023-01-04 LAB — BASIC METABOLIC PANEL
Anion gap: 5 (ref 5–15)
BUN: 13 mg/dL (ref 8–23)
CO2: 21 mmol/L — ABNORMAL LOW (ref 22–32)
Calcium: 8.7 mg/dL — ABNORMAL LOW (ref 8.9–10.3)
Chloride: 112 mmol/L — ABNORMAL HIGH (ref 98–111)
Creatinine, Ser: 1.12 mg/dL (ref 0.61–1.24)
GFR, Estimated: >60 mL/min (ref >60)
Glucose, Bld: 93 mg/dL (ref 70–99)
Potassium: 3.8 mmol/L (ref 3.5–5.1)
Sodium: 138 mmol/L (ref 135–145)

## 2023-01-04 LAB — CBC
HCT: 39.6 % (ref 39.0–52.0)
Hemoglobin: 13.2 g/dL (ref 13.0–17.0)
MCH: 32.7 pg (ref 26.0–34.0)
MCHC: 33.3 g/dL (ref 30.0–36.0)
MCV: 98 fL (ref 80.0–100.0)
Platelets: 276 10*3/uL (ref 150–400)
RBC: 4.04 MIL/uL — ABNORMAL LOW (ref 4.22–5.81)
RDW: 13.4 % (ref 11.5–15.5)
WBC: 4.3 10*3/uL (ref 4.0–10.5)
nRBC: 0 % (ref 0.0–0.2)

## 2023-01-04 LAB — TROPONIN I (HIGH SENSITIVITY): Troponin I (High Sensitivity): 10 ng/L (ref <18)

## 2023-01-04 MED ORDER — CLINDAMYCIN HCL 150 MG PO CAPS
300.0000 mg | ORAL_CAPSULE | Freq: Once | ORAL | Status: AC
  Administered 2023-01-04: 300 mg via ORAL
  Filled 2023-01-04: qty 2

## 2023-01-04 MED ORDER — METOPROLOL TARTRATE 25 MG PO TABS
25.0000 mg | ORAL_TABLET | Freq: Once | ORAL | Status: AC
  Administered 2023-01-04: 25 mg via ORAL
  Filled 2023-01-04: qty 1

## 2023-01-04 MED ORDER — CLINDAMYCIN HCL 300 MG PO CAPS
300.0000 mg | ORAL_CAPSULE | Freq: Three times a day (TID) | ORAL | 0 refills | Status: AC
  Filled 2023-01-04: qty 30, 10d supply, fill #0

## 2023-01-04 NOTE — ED Notes (Signed)
Other RN was attempting bloodwork, pt protesting. Will attempt u/s IV after PA student talks with pt.

## 2023-01-04 NOTE — ED Notes (Signed)
Pt was given hot lunch tray. Pt stating "I still want a sandwich".

## 2023-01-04 NOTE — ED Provider Notes (Signed)
[facility] Provider Note    Event Date/Time   First MD Initiated Contact with Patient 01/04/23 1118     (approximate)  History   Chief Complaint: Chest Pain  HPI  Jeremy [name] is a 67 y.o. male with a past medical history of CAD, diabetes, hypertension, CABG, presents to the emergency department for chest discomfort.  According to the patient and report he was at [facility] earlier when he got into an argument with his wife which led to some chest pain.  Patient denies any pain or shortness of breath currently.  Patient states this often happens when he gets into arguments.  Here patient is asking for food but denies any pain or shortness of breath denies any nausea or diaphoresis at any point.  Physical Exam   Triage Vital Signs: ED Triage Vitals  Encounter Vitals Group     BP 01/04/23 1109 (!) 147/101     Systolic BP Percentile --      Diastolic BP Percentile --      Pulse Rate 01/04/23 1114 (!) 111     Resp 01/04/23 1109 20     Temp 01/04/23 1109 98.2 F (36.8 C)     Temp src --      SpO2 01/04/23 1109 100 %     Weight 01/04/23 1109 240 lb (108.9 kg)     Height 01/04/23 1109 5\' 11"  (1.803 m)     Head Circumference --      Peak Flow --      Pain Score 01/04/23 1109 3     Pain Loc --      Pain Education --      Exclude from Growth Chart --     Most recent vital signs: Vitals:   01/04/23 1109 01/04/23 1114  BP: (!) 147/101   Pulse:  (!) 111  Resp: 20   Temp: 98.2 F (36.8 C)   SpO2: 100%     General: Awake, no distress.  CV:  Good peripheral perfusion.  Regular rate and rhythm  Resp:  Normal effort.  Equal breath sounds bilaterally.  Abd:  No distention.  Soft, nontender.  No rebound or guarding.  ED Results / Procedures / Treatments   EKG  EKG viewed and interpreted by myself shows what appears to be atrial fibrillation versus atrial flutter at 111 bpm with a narrow QRS, left axis deviation, PR prolongation otherwise normal  intervals with nonspecific ST changes.  T wave inversions largely unchanged from prior EKG on my review.  RADIOLOGY  I have reviewed and interpreted the chest x-ray images.  No consolidation on my evaluation.   MEDICATIONS ORDERED IN ED: Medications  metoprolol tartrate (LOPRESSOR) tablet 25 mg (25 mg Oral Given 01/04/23 1259)     IMPRESSION / MDM / ASSESSMENT AND PLAN / ED COURSE  I reviewed the triage vital signs and the nursing notes.  Patient's presentation is most consistent with acute presentation with potential threat to life or bodily function.  Patient presents to the emergency department for chest pain that occurred during an argument.  States no discomfort or shortness of breath currently.  Has no symptoms.  Is requesting food.  Allowed [place] to take blood which has resulted showing reassuring CBC reassuring chemistry and a reassuringly negative troponin.  However I did discuss given his atrial fibrillation IV hydrating and treating with a rate control medication.  Patient states he does not want any more sticks does not want an IV.  States  he does not want to be here all day.  We will dose oral metoprolol.  Patient admits he has not been taking his medications although states he has all of his medicines at home.  Discussed with the patient the importance of restarting his medications.  Given his reassuring workup reassuring exam I believe the patient is safe for discharge home to restart his home medications and follow-up with his doctor.  FINAL CLINICAL IMPRESSION(S) / ED DIAGNOSES   Chest pain   Note:  This document was prepared using Dragon voice recognition software and may include unintentional dictation errors.   [name], MD 01/04/23 1323

## 2023-01-04 NOTE — Discharge Instructions (Addendum)
Please begin taking your medications as prescribed by your primary doctor.  Please follow-up with your primary doctor in the next 1 to 2 days for recheck/reevaluation.  Your workup in the emergency department has shown reassuring results including lab work EKG and a chest x-ray.  Return to the emergency department for any return of/worsening chest pain, trouble breathing or any other symptom personally concerning to yourself.

## 2023-01-04 NOTE — ED Notes (Signed)
Pt yelling for RN. Door is open.

## 2023-01-04 NOTE — ED Notes (Addendum)
Pt refusing an IV with ED provider present.

## 2023-01-04 NOTE — ED Notes (Signed)
Pt to xray

## 2023-01-04 NOTE — ED Notes (Signed)
Pt demanding food, stating "you guys forgot about me". Informed pt that Brayton Caves, RN already placed diet order for him because ED out of [place] sandwich trays.

## 2023-01-04 NOTE — ED Notes (Signed)
Pt demanding to have something done for his ankle, stating "I need some antibiotics or something!" And refusing to leave until he talks to ED provider again.

## 2023-01-04 NOTE — ED Triage Notes (Signed)
Pt to ED from [facility] for a fib RVR, noticed palpitations while at [employer] today. Reports feeling sore to chest.

## 2023-01-08 ENCOUNTER — Emergency Department: Payer: Medicare HMO

## 2023-01-08 ENCOUNTER — Emergency Department
Admission: EM | Admit: 2023-01-08 | Discharge: 2023-01-08 | Disposition: A | Discharge status: Home / Self Care | Payer: Medicare HMO | Attending: Student in an Organized Health Care Education/Training Program | Admitting: Student in an Organized Health Care Education/Training Program

## 2023-01-08 ENCOUNTER — Other Ambulatory Visit: Payer: Self-pay

## 2023-01-08 DIAGNOSIS — Z951 Presence of aortocoronary bypass graft: Secondary | ICD-10-CM | POA: Diagnosis not present

## 2023-01-08 DIAGNOSIS — E119 Type 2 diabetes mellitus without complications: Secondary | ICD-10-CM | POA: Diagnosis not present

## 2023-01-08 DIAGNOSIS — R0789 Other chest pain: Secondary | ICD-10-CM | POA: Insufficient documentation

## 2023-01-08 DIAGNOSIS — I2510 Atherosclerotic heart disease of native coronary artery without angina pectoris: Secondary | ICD-10-CM | POA: Insufficient documentation

## 2023-01-08 DIAGNOSIS — I10 Essential (primary) hypertension: Secondary | ICD-10-CM | POA: Insufficient documentation

## 2023-01-08 DIAGNOSIS — I4891 Unspecified atrial fibrillation: Secondary | ICD-10-CM | POA: Diagnosis not present

## 2023-01-08 DIAGNOSIS — R079 Chest pain, unspecified: Secondary | ICD-10-CM | POA: Diagnosis not present

## 2023-01-08 LAB — BASIC METABOLIC PANEL
Anion gap: 6 (ref 5–15)
BUN: 16 mg/dL (ref 8–23)
CO2: 26 mmol/L (ref 22–32)
Calcium: 9 mg/dL (ref 8.9–10.3)
Chloride: 106 mmol/L (ref 98–111)
Creatinine, Ser: 1.36 mg/dL — ABNORMAL HIGH (ref 0.61–1.24)
GFR, Estimated: 58 mL/min — ABNORMAL LOW (ref >60)
Glucose, Bld: 103 mg/dL — ABNORMAL HIGH (ref 70–99)
Potassium: 3.6 mmol/L (ref 3.5–5.1)
Sodium: 138 mmol/L (ref 135–145)

## 2023-01-08 LAB — CBC
HCT: 43.3 % (ref 39.0–52.0)
Hemoglobin: 14.3 g/dL (ref 13.0–17.0)
MCH: 32.1 pg (ref 26.0–34.0)
MCHC: 33 g/dL (ref 30.0–36.0)
MCV: 97.3 fL (ref 80.0–100.0)
Platelets: 276 10*3/uL (ref 150–400)
RBC: 4.45 MIL/uL (ref 4.22–5.81)
RDW: 13.6 % (ref 11.5–15.5)
WBC: 3.1 10*3/uL — ABNORMAL LOW (ref 4.0–10.5)
nRBC: 0 % (ref 0.0–0.2)

## 2023-01-08 LAB — TROPONIN I (HIGH SENSITIVITY)
Troponin I (High Sensitivity): 10 ng/L (ref <18)
Troponin I (High Sensitivity): 16 ng/L (ref <18)

## 2023-01-08 NOTE — ED Notes (Signed)
Patient given apple and grape juice. Sandwich tray given to patient

## 2023-01-08 NOTE — ED Triage Notes (Signed)
Arrives from home  c/o right sided CP x 2 hours.  Seen through ED 2 days ago.  Hx afib, afib on monitor.  HR:  100-115.  VS wnl.  Refused IV and meds PTA per report.

## 2023-01-08 NOTE — ED Notes (Signed)
Patient refused IV at this time. Allowed RN to use butterfly needle to obtain blood draw

## 2023-01-08 NOTE — ED Provider Notes (Signed)
[facility] Provider Note    Event Date/Time   First MD Initiated Contact with Patient 01/08/23 1315     (approximate)   History   Chest Pain   HPI  Jeremy [name] is a 67 y.o. male with a history of CAD diabetes hypertension CABG x 1 presents to the ER for evaluation of chest pain and pressure started today.  Got better with nitro that he takes at home.  Has had multiple visits to the ER for similar symptoms of the past few months.  He is currently pain-free denies any shortness of breath he has been compliant with his medications.     Physical Exam   Triage Vital Signs: ED Triage Vitals  Encounter Vitals Group     BP 01/08/23 1318 110/87     Systolic BP Percentile --      Diastolic BP Percentile --      Pulse Rate 01/08/23 1318 99     Resp 01/08/23 1318 18     Temp --      Temp Source 01/08/23 1318 Oral     SpO2 01/08/23 1318 97 %     Weight 01/08/23 1317 240 lb 1.3 oz (108.9 kg)     Height --      Head Circumference --      Peak Flow --      Pain Score 01/08/23 1316 4     Pain Loc --      Pain Education --      Exclude from Growth Chart --     Most recent vital signs: Vitals:   01/08/23 1318  BP: 110/87  Pulse: 99  Resp: 18  SpO2: 97%     Constitutional: Alert  Eyes: Conjunctivae are normal.  Head: Atraumatic. Nose: No congestion/rhinnorhea. Mouth/Throat: Mucous membranes are moist.   Neck: Painless ROM.  Cardiovascular:   Good peripheral circulation.  Irregularly irregular Respiratory: Normal respiratory effort.  No retractions.  Gastrointestinal: Soft and nontender.  Musculoskeletal:  no deformity Neurologic:  MAE spontaneously. No gross focal neurologic deficits are appreciated.  Skin:  Skin is warm, dry and intact. No rash noted. Psychiatric: Mood and affect are normal. Speech and behavior are normal.    ED Results / Procedures / Treatments   Labs (all labs ordered are listed, but only abnormal results are  displayed) Labs Reviewed  BASIC METABOLIC PANEL - Abnormal; Notable for the following components:      Result Value   Glucose, Bld 103 (*)    Creatinine, Ser 1.36 (*)    GFR, Estimated 58 (*)    All other components within normal limits  CBC - Abnormal; Notable for the following components:   WBC 3.1 (*)    All other components within normal limits  TROPONIN I (HIGH SENSITIVITY)     EKG ED ECG REPORT I, Willy Eddy, the attending physician, personally viewed and interpreted this ECG.   Date: 01/08/2023  EKG Time: 13:28  Rate: 100  Rhythm: aflutter  Axis: normal  Intervals: normal qt  ST&T Change: no stemi, nonspecific st abn     RADIOLOGY Please see ED Course for my review and interpretation.  I personally reviewed all radiographic images ordered to evaluate for the above acute complaints and reviewed radiology reports and findings.  These findings were personally discussed with the patient.  Please see medical record for radiology report.    PROCEDURES:  Critical Care performed: No  Procedures   MEDICATIONS ORDERED IN ED: Medications -  No data to display   IMPRESSION / MDM / ASSESSMENT AND PLAN / ED COURSE  I reviewed the triage vital signs and the nursing notes.                              Differential diagnosis includes, but is not limited to, ACS, pericarditis, esophagitis, boerhaaves, pe, dissection, pna, bronchitis, costochondritis  Patient presenting to the ER for evaluation of symptoms as described above.  Based on symptoms, risk factors and considered above differential, this presenting complaint could reflect a potentially life-threatening illness therefore the patient will be placed on continuous pulse oximetry and telemetry for monitoring.  Laboratory evaluation will be sent to evaluate for the above complaints.  Patient nontoxic-appearing in no acute distress. extensive past medical history and comorbidities.  EKG with some nonspecific  changes.  Initial troponin is negative.  Have low suspicion for ACS but will observe for serial cardiac enzymes.  Doubt PE as he is compliant on his Xarelto.  Not consistent with dissection he is currently pain-free.  Likely stable angina.  Patient will be signed out to oncoming physician pending follow-up repeat troponin.       FINAL CLINICAL IMPRESSION(S) / ED DIAGNOSES   Final diagnoses:  Nonspecific chest pain     Rx / DC Orders   ED Discharge Orders     None        Note:  This document was prepared using Dragon voice recognition software and may include unintentional dictation errors.    Willy Eddy, MD 01/08/23 [phone]

## 2023-01-08 NOTE — ED Provider Notes (Signed)
Reviewing signout with Dr. Roxan Hockey, we noted that second troponin has returned and is normal.  Dr. Roxan Hockey advises patient is ready for discharge, to go ahead and place discharge) discharge instructions.  No interaction required by myself. I did not see the patient personally.   Sharyn Creamer, MD 01/08/23 [phone]

## 2023-01-15 ENCOUNTER — Emergency Department: Payer: 59

## 2023-01-15 ENCOUNTER — Emergency Department
Admission: EM | Admit: 2023-01-15 | Discharge: 2023-01-15 | Disposition: A | Discharge status: Home / Self Care | Payer: 59 | Attending: Student in an Organized Health Care Education/Training Program | Admitting: Student in an Organized Health Care Education/Training Program

## 2023-01-15 ENCOUNTER — Other Ambulatory Visit: Payer: Self-pay

## 2023-01-15 DIAGNOSIS — L03115 Cellulitis of right lower limb: Secondary | ICD-10-CM | POA: Diagnosis not present

## 2023-01-15 DIAGNOSIS — L03119 Cellulitis of unspecified part of limb: Secondary | ICD-10-CM

## 2023-01-15 DIAGNOSIS — M25571 Pain in right ankle and joints of right foot: Secondary | ICD-10-CM | POA: Diagnosis not present

## 2023-01-15 DIAGNOSIS — E119 Type 2 diabetes mellitus without complications: Secondary | ICD-10-CM | POA: Diagnosis not present

## 2023-01-15 DIAGNOSIS — Z87891 Personal history of nicotine dependence: Secondary | ICD-10-CM | POA: Insufficient documentation

## 2023-01-15 LAB — BASIC METABOLIC PANEL
Anion gap: 7 (ref 5–15)
BUN: 15 mg/dL (ref 8–23)
CO2: 23 mmol/L (ref 22–32)
Calcium: 9.2 mg/dL (ref 8.9–10.3)
Chloride: 106 mmol/L (ref 98–111)
Creatinine, Ser: 1.25 mg/dL — ABNORMAL HIGH (ref 0.61–1.24)
GFR, Estimated: >60 mL/min (ref >60)
Glucose, Bld: 110 mg/dL — ABNORMAL HIGH (ref 70–99)
Potassium: 3.7 mmol/L (ref 3.5–5.1)
Sodium: 136 mmol/L (ref 135–145)

## 2023-01-15 LAB — CBC
HCT: 39.2 % (ref 39.0–52.0)
Hemoglobin: 13.5 g/dL (ref 13.0–17.0)
MCH: 31.9 pg (ref 26.0–34.0)
MCHC: 34.4 g/dL (ref 30.0–36.0)
MCV: 92.7 fL (ref 80.0–100.0)
Platelets: 218 10*3/uL (ref 150–400)
RBC: 4.23 MIL/uL (ref 4.22–5.81)
RDW: 13 % (ref 11.5–15.5)
WBC: 4 10*3/uL (ref 4.0–10.5)
nRBC: 0 % (ref 0.0–0.2)

## 2023-01-15 LAB — SEDIMENTATION RATE: Sed Rate: 26 mm/hr — ABNORMAL HIGH (ref 0–20)

## 2023-01-15 MED ORDER — CLINDAMYCIN HCL 150 MG PO CAPS
300.0000 mg | ORAL_CAPSULE | Freq: Once | ORAL | Status: AC
  Administered 2023-01-15: 300 mg via ORAL
  Filled 2023-01-15: qty 2

## 2023-01-15 MED ORDER — CLINDAMYCIN HCL 150 MG PO CAPS: 300.0000 mg | ORAL_CAPSULE | Freq: Three times a day (TID) | ORAL | 0 refills | Status: DC

## 2023-01-15 NOTE — ED Provider Notes (Signed)
  Physical Exam  BP (!) 149/93   Pulse 68   Temp 98.1 F (36.7 C) (Oral)   Resp 20   Ht 5\' 11"  (1.803 m)   Wt 110.7 kg   SpO2 93%   BMI 34.03 kg/m   Physical Exam  Procedures  Procedures  ED Course / MDM   Clinical Course as of 01/15/23 2009  Sat Jan 15, 2023  1414 Message sent to Dr. Ralene Cork [JP]  1429 Discussed with Dr. Ralene Cork who agrees with plan for MRI.  If MRI is revealing for infection, plan for admission for IV antibiotics [JP]    Clinical Course User Index [JP] Poggi, Herb Grays, PA-C   Medical Decision Making Amount and/or Complexity of Data Reviewed Labs: ordered. Radiology: ordered.  Risk Prescription drug management.   Receiving care of the patient from [place], [place].  In short we are awaiting the MRI to determine if patient has osteomyelitis.  If positive will need to be admitted for IV antibiotics.  If negative can send him home on medication.  The patient has an ankle monitor.  This is not compatible with an MRI.  Consult to Dr. Ralene Cork about different imaging due to the ankle monitor being a problem.  She states to do CT of the ankle without contrast.  If no deep infection can discharge and be followed up in the clinic. CT of the ankle without contrast, did independently review and interpret the radiologist reading as being negative for any acute abnormality.  No concerns at this time for deep soft tissue infection.  Will go ahead and give the patient clindamycin 300 mg while here in the ED.  Be given a prescription for clindamycin 300 mg 3 times daily.  He is to follow-up with Dr. Ralene Cork.  He was given her phone number.  He is given a pair crutches.  Discharged in stable condition.  Also given a food tray prior to discharge    Bartholomew Boards 01/15/23 2009    Loleta Rose, MD 01/16/23 [phone]

## 2023-01-15 NOTE — ED Triage Notes (Signed)
First nurse note: pt to ED [facility] from home for swelling to right ankle for unknown time

## 2023-01-15 NOTE — ED Notes (Signed)
Received return call from [employer] [facility] office. Sgt Rice referred this nurse to the [employer] ([employer]) and stated that it was acceptable to cut the ankle monitor off in an emergency situation but that the patient had to be held at the medical facility until [facility] could come and place a new one on. This nurse called [employer] at the above number and spoke with Joni Reining who confirmed the Sgts instructions. She emphasized that once removed, the patient HAD TO BE HELD at the facility until they were able to come and place a new ankle monitor prior to discharge. Charge nurse notified of situation. Provider notified of situation. Removed monitor to be kept with patient belongings and turned over to [employer] upon arrival to place new monitor.

## 2023-01-15 NOTE — ED Notes (Signed)
Ankle monitor did not need to be removed. Pt unable to find ride and unable to give home address for EMS transport. Pt discharged to lobby. First Nurse notified.

## 2023-01-15 NOTE — ED Provider Notes (Signed)
[facility] Provider Note    Event Date/Time   First MD Initiated Contact with Patient 01/15/23 1354     (approximate)   History   Ankle Pain   HPI  Jeremy [name] is a 67 y.o. male with a past medical history of diabetes, substance use, ORIF of the right ankle in 2002 at [facility] who presents today for evaluation of ulceration of his right ankle.  Patient reports that this has been ongoing for a long time, though he feels that he has gotten pain over the last couple of days.  He denies any injury.  He has not had any fevers or chills.  He is a very poor historian and cannot give me any further answers other than vague timeframes.   Patient Active Problem List   Diagnosis Date Noted   Acute ST elevation myocardial infarction (STEMI) of posterior wall (HCC) 11/24/2022   HFrEF (heart failure with reduced ejection fraction) (HCC) 11/24/2022   PAF (paroxysmal atrial fibrillation) (HCC) 11/24/2022   OSA (obstructive sleep apnea) 11/24/2022   Coronary artery disease involving native coronary artery of native heart 11/24/2022   Polysubstance abuse (HCC) 11/24/2022   Hyperlipidemia 11/24/2022   Aortic atherosclerosis (HCC) 07/16/2021   Obesity (BMI 30-39.9) 07/16/2021   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/2021   Stage 3 chronic kidney disease (HCC) 07/16/2021   On mechanically assisted ventilation (HCC) 12/28/2020   Atrial fibrillation with RVR (HCC) 12/28/2020   Acute delirium 12/28/2020   Tobacco abuse 12/28/2020   Diabetes mellitus, type 2 (HCC) 12/28/2020   NSTEMI (non-ST elevated myocardial infarction) (HCC)    Acute respiratory failure with hypoxia (HCC)    Bilateral pulmonary embolism (HCC) 12/22/2020   History of cocaine use 11/26/2019   History of marijuana use 11/26/2019   History of illicit drug use 11/26/2019   Pharmacologic therapy 11/26/2019   Acute pain (now resolved) 11/26/2019   MRSA (methicillin resistant Staphylococcus aureus) infection 10/18/2019    Hyperosmolar non-ketotic state due to type 2 diabetes mellitus (HCC) 08/21/2019   Hyponatremia 08/19/2019   AKI (acute kidney injury) (HCC) 08/19/2019   Essential hypertension 08/19/2019   Medically noncompliant 08/19/2019   Post-traumatic osteoarthritis of right ankle 10/23/2018   Skin ulcer of right ankle with fat layer exposed (HCC) 10/23/2018   Status post ORIF of fracture of ankle 10/23/2018   Diabetic foot infection (HCC) 10/22/2018   Pressure injury of skin 02/11/2018          Physical Exam   Triage Vital Signs: ED Triage Vitals  Encounter Vitals Group     BP 01/15/23 1303 (!) 149/93     Systolic BP Percentile --      Diastolic BP Percentile --      Pulse Rate 01/15/23 1301 68     Resp 01/15/23 1301 20     Temp 01/15/23 1301 98.1 F (36.7 C)     Temp Source 01/15/23 1301 Oral     SpO2 01/15/23 1301 93 %     Weight 01/15/23 1302 244 lb (110.7 kg)     Height 01/15/23 1302 5\' 11"  (1.803 m)     Head Circumference --      Peak Flow --      Pain Score 01/15/23 1302 10     Pain Loc --      Pain Education --      Exclude from Growth Chart --     Most recent vital signs: Vitals:   01/15/23 1301 01/15/23 1303  BP:  (!) 149/93  Pulse: 68   Resp: 20   Temp: 98.1 F (36.7 C)   SpO2: 93%     Physical Exam Vitals and nursing note reviewed.  Constitutional:      General: Awake and alert. No acute distress.    Appearance: Normal appearance. The patient is normal weight.  HENT:     Head: Normocephalic and atraumatic.     Mouth: Mucous membranes are moist.  Eyes:     General: PERRL. Normal EOMs        Right eye: No discharge.        Left eye: No discharge.     Conjunctiva/sclera: Conjunctivae normal.  Cardiovascular:     Rate and Rhythm: Normal rate and regular rhythm.     Pulses: Normal pulses.  Pulmonary:     Effort: Pulmonary effort is normal. No respiratory distress.     Breath sounds: Normal breath sounds.  Abdominal:     Abdomen is soft. There is no  abdominal tenderness. No rebound or guarding. No distention. Musculoskeletal:        General: No swelling. Normal range of motion.     Cervical back: Normal range of motion and neck supple.  Right lower extremity: Ankle is very edematous, though not warm or erythematous.  There is an ulceration noted to the lateral aspect that is draining purulence, with increased drainage with plantar and dorsiflexion at the ankle.  Sensation is intact to light touch.  His foot is warm and well-perfused.  He has pain with range of motion of his ankle. Skin:    General: Skin is warm and dry.     Capillary Refill: Capillary refill takes less than 2 seconds.     Findings: No rash.  Neurological:     Mental Status: The patient is awake and alert.        ED Results / Procedures / Treatments   Labs (all labs ordered are listed, but only abnormal results are displayed) Labs Reviewed  BASIC METABOLIC PANEL - Abnormal; Notable for the following components:      Result Value   Glucose, Bld 110 (*)    Creatinine, Ser 1.25 (*)    All other components within normal limits  CBC  SEDIMENTATION RATE  C-REACTIVE PROTEIN     EKG     RADIOLOGY I independently reviewed and interpreted imaging and agree with radiologists findings.     PROCEDURES:  Critical Care performed:   Procedures   MEDICATIONS ORDERED IN ED: Medications - No data to display   IMPRESSION / MDM / ASSESSMENT AND PLAN / ED COURSE  I reviewed the triage vital signs and the nursing notes.   Differential diagnosis includes, but is not limited to, infected hardware, abscess, cellulitis, septic joint.  I reviewed the patient's chart.  Patient was seen by the podiatrist on 06/18/2022 for the same complaint.  At that time, patient was found to have exposed hardware within the ulcer.  It was felt that the patient needed to have his hardware removed, though they recommended vascular workup prior to this.  Patient is awake and alert,  hemodynamically stable and afebrile.  He has a scabbed appearing ulceration that has purulent drainage with movement of his ankle.  Labs obtained in triage are overall reassuring, no leukocytosis.  I have added on ESR and CRP.  X-ray of his ankle reveals moderate lateral malleoli are soft tissue swelling that is increased from prior.  I discussed with Dr. Ralene Cork with  podiatry on-call who agrees with plan for MRI.  If MRI is positive for infection, plan for admission with IV antibiotics.  Patient was passed off to S. Fisher, PA-C pending MRI and final disposition.   Patient's presentation is most consistent with acute presentation with potential threat to life or bodily function.    Clinical Course as of 01/15/23 1509  Sat Jan 15, 2023  1414 Message sent to Dr. Ralene Cork [JP]  1429 Discussed with Dr. Ralene Cork who agrees with plan for MRI.  If MRI is revealing for infection, plan for admission for IV antibiotics [JP]    Clinical Course User Index [JP] , Herb Grays, PA-C     FINAL CLINICAL IMPRESSION(S) / ED DIAGNOSES   Final diagnoses:  Acute right ankle pain     Rx / DC Orders   ED Discharge Orders     None        Note:  This document was prepared using Dragon voice recognition software and may include unintentional dictation errors.   Keturah Shavers 01/15/23 1509    Willy Eddy, MD 01/15/23 [phone]

## 2023-01-15 NOTE — ED Triage Notes (Signed)
Patient states right ankle pain, small amount of green drainage noted to right lateral side of ankle.

## 2023-01-15 NOTE — Discharge Instructions (Signed)
Follow-up with Dr. Ralene Cork, call her office for an appointment Take your medication as prescribed Return emergency department worsening

## 2023-01-15 NOTE — ED Notes (Signed)
This nurse noted that the patient has an ankle monitor placed on his left ankle. Contacted MRI who confirmed that the ankle monitor was NOT safe for the MRI. Pt asked if he knew the number for his parole officer and he stated "nobody is watching over me, the cops just circle around and check in my window to see if the TV is on" Pt was either unable to unwilling to help figure out how to have the monitor removed for his imaging. Contacted the [facility] non emergency number to consult with a deputy on how to go about having the monitor temporarily removed. Dispatcher stated he would have a deputy call back as soon as possible. Provider notified.

## 2023-01-15 NOTE — Progress Notes (Signed)
PA in and states may not need IV at this time.

## 2023-01-15 NOTE — ED Notes (Signed)
The pt had an iv order in. The pt was advised we were going to start an iv on him. The tourniquet was applied to the pt. The pt's arm was extended out. The pt told me to not pull on his arm. I explained to him I was not pulling on his arm that I had to extend it out to start an iv.The pt told me not to talk to him like that and that he was scared of needles. And pulled his arm away again. I asked him if he wanted to refuse the iv. The pt advised yes he did. He told me to leave him the hell alone. Security was called and spoke with the pt. Pt advised he would do the iv if security was present.

## 2023-01-17 ENCOUNTER — Telehealth: Payer: Self-pay

## 2023-01-17 NOTE — Telephone Encounter (Signed)
Transition Care Management Unsuccessful Follow-up Telephone Call  Date of discharge and from where:  [place] 7/27  Attempts:  2nd Attempt  Reason for unsuccessful TCM follow-up call:  No answer/busy

## 2023-01-17 NOTE — Telephone Encounter (Signed)
Transition Care Management Unsuccessful Follow-up Telephone Call  Date of discharge and from where:  [place] 7/27  Attempts:  1st Attempt  Reason for unsuccessful TCM follow-up call:  No answer/busy   Lenard Forth [facility] Guide, [facility] [phone] [address] [place], [place], [place] [address] Phone: [phone] Email: [email or web address]

## 2023-01-21 ENCOUNTER — Telehealth: Payer: Self-pay

## 2023-01-21 NOTE — Telephone Encounter (Signed)
Transition Care Management Unsuccessful Follow-up Telephone Call  Date of discharge and from where:  01/15/2023 [facility]  Attempts:  1st Attempt  Reason for unsuccessful TCM follow-up call:  Unable to leave message   [facility]  [facility] Population Health Community Resource Care Guide   [email or web address]  ?? [phone]   Website: [email or web address]  [email or web address]

## 2023-01-24 ENCOUNTER — Telehealth: Payer: Self-pay

## 2023-01-24 NOTE — Telephone Encounter (Signed)
Transition Care Management Unsuccessful Follow-up Telephone Call  Date of discharge and from where:  01/15/2023 [facility]  Attempts:  2nd Attempt  Reason for unsuccessful TCM follow-up call:  Unable to leave message   [facility]  [facility] Population Health Community Resource Care Guide   [email or web address]  ?? [phone]   Website: [email or web address]  [email or web address]

## 2023-02-08 ENCOUNTER — Other Ambulatory Visit: Payer: Self-pay

## 2023-02-08 ENCOUNTER — Emergency Department: Payer: 59

## 2023-02-08 ENCOUNTER — Encounter: Payer: Self-pay | Admitting: Emergency Medicine

## 2023-02-08 ENCOUNTER — Emergency Department
Admission: EM | Admit: 2023-02-08 | Discharge: 2023-02-08 | Disposition: A | Discharge status: Home / Self Care | Payer: 59 | Attending: Emergency Medicine | Admitting: Emergency Medicine

## 2023-02-08 DIAGNOSIS — I2510 Atherosclerotic heart disease of native coronary artery without angina pectoris: Secondary | ICD-10-CM | POA: Insufficient documentation

## 2023-02-08 DIAGNOSIS — Z4800 Encounter for change or removal of nonsurgical wound dressing: Secondary | ICD-10-CM | POA: Diagnosis present

## 2023-02-08 DIAGNOSIS — Z5189 Encounter for other specified aftercare: Secondary | ICD-10-CM

## 2023-02-08 DIAGNOSIS — E119 Type 2 diabetes mellitus without complications: Secondary | ICD-10-CM | POA: Insufficient documentation

## 2023-02-08 LAB — CBC WITH DIFFERENTIAL/PLATELET
Abs Immature Granulocytes: 0.01 10*3/uL (ref 0.00–0.07)
Basophils Absolute: 0 10*3/uL (ref 0.0–0.1)
Basophils Relative: 1 %
Eosinophils Absolute: 0.2 10*3/uL (ref 0.0–0.5)
Eosinophils Relative: 3 %
HCT: 39.5 % (ref 39.0–52.0)
Hemoglobin: 12.9 g/dL — ABNORMAL LOW (ref 13.0–17.0)
Immature Granulocytes: 0 %
Lymphocytes Relative: 37 %
Lymphs Abs: 1.8 10*3/uL (ref 0.7–4.0)
MCH: 31.3 pg (ref 26.0–34.0)
MCHC: 32.7 g/dL (ref 30.0–36.0)
MCV: 95.9 fL (ref 80.0–100.0)
Monocytes Absolute: 0.6 10*3/uL (ref 0.1–1.0)
Monocytes Relative: 13 %
Neutro Abs: 2.2 10*3/uL (ref 1.7–7.7)
Neutrophils Relative %: 46 %
Platelets: 343 10*3/uL (ref 150–400)
RBC: 4.12 MIL/uL — ABNORMAL LOW (ref 4.22–5.81)
RDW: 12.9 % (ref 11.5–15.5)
WBC: 4.8 10*3/uL (ref 4.0–10.5)
nRBC: 0 % (ref 0.0–0.2)

## 2023-02-08 LAB — COMPREHENSIVE METABOLIC PANEL
ALT: 8 U/L (ref 0–44)
AST: 13 U/L — ABNORMAL LOW (ref 15–41)
Albumin: 3.7 g/dL (ref 3.5–5.0)
Alkaline Phosphatase: 55 U/L (ref 38–126)
Anion gap: 7 (ref 5–15)
BUN: 13 mg/dL (ref 8–23)
CO2: 20 mmol/L — ABNORMAL LOW (ref 22–32)
Calcium: 9.2 mg/dL (ref 8.9–10.3)
Chloride: 110 mmol/L (ref 98–111)
Creatinine, Ser: 1.2 mg/dL (ref 0.61–1.24)
GFR, Estimated: >60 mL/min (ref >60)
Glucose, Bld: 87 mg/dL (ref 70–99)
Potassium: 4.3 mmol/L (ref 3.5–5.1)
Sodium: 137 mmol/L (ref 135–145)
Total Bilirubin: 0.8 mg/dL (ref 0.3–1.2)
Total Protein: 7.4 g/dL (ref 6.5–8.1)

## 2023-02-08 LAB — LACTIC ACID, PLASMA: Lactic Acid, Venous: 1.7 mmol/L (ref 0.5–1.9)

## 2023-02-08 MED ORDER — CLINDAMYCIN PHOSPHATE 600 MG/50ML IV SOLN
600.0000 mg | Freq: Once | INTRAVENOUS | Status: AC
  Administered 2023-02-08: 600 mg via INTRAVENOUS
  Filled 2023-02-08: qty 50

## 2023-02-08 MED ORDER — SODIUM CHLORIDE 0.9 % IV BOLUS
1000.0000 mL | Freq: Once | INTRAVENOUS | Status: AC
  Administered 2023-02-08: 1000 mL via INTRAVENOUS

## 2023-02-08 MED ORDER — CLINDAMYCIN HCL 300 MG PO CAPS
600.0000 mg | ORAL_CAPSULE | Freq: Two times a day (BID) | ORAL | 0 refills | Status: AC
  Filled 2023-02-08: qty 40, 10d supply, fill #0

## 2023-02-08 NOTE — ED Notes (Signed)
XR at bedside at this time.

## 2023-02-08 NOTE — ED Notes (Signed)
Dr. Vicente Males, EDP at bedside at this time.

## 2023-02-08 NOTE — ED Notes (Signed)
Pt given sandwich tray, coffee, and water at this time.

## 2023-02-08 NOTE — ED Triage Notes (Signed)
Pt via [facility] from home. Pt c/o R ankle pain and swelling. States that he had surgery at [facility] recently and stated, "my screw came out" approx a week ago. Since then report ankle pain and draining blood and pus. Pt has a hx of DM. Pt is A&Ox4 and NAD

## 2023-02-08 NOTE — ED Provider Notes (Signed)
[facility] Provider Note   Event Date/Time   First MD Initiated Contact with Patient 02/08/23 [phone]     (approximate) History  Wound Check  HPI Jeremy [name] is a 67 y.o. male with a past medical history of diabetes and CAD status post CABG who presents complaining of a chronic wound to the right lateral ankle that has been present since a right ankle surgery in 2008 at [facility].  This history obtained from patient as I cannot find records.  Patient states that he has a "screw that is coming out" from the right portion of the hardware in his ankle and has had drainage from this area as well as significant pain.  Patient states that he has been seen in the outpatient setting for this wound however does not know what the plan was.  Patient states that he has been on antibiotics but has not had any for the past few weeks as this pain has been worsening. ROS: Patient currently denies any vision changes, tinnitus, difficulty speaking, facial droop, sore throat, chest pain, shortness of breath, abdominal pain, nausea/vomiting/diarrhea, dysuria, or weakness/numbness/paresthesias in any extremity   Physical Exam  Triage Vital Signs: ED Triage Vitals  Encounter Vitals Group     BP      Systolic BP Percentile      Diastolic BP Percentile      Pulse      Resp      Temp      Temp src      SpO2      Weight      Height      Head Circumference      Peak Flow      Pain Score      Pain Loc      Pain Education      Exclude from Growth Chart    Most recent vital signs: Vitals:   02/08/23 1100 02/08/23 1130  BP: (!) 112/94 (!) 127/94  Pulse: 93 98  Resp: 15 18  Temp:    SpO2: 100% 100%   General: Awake, oriented x4. CV:  Good peripheral perfusion.  Resp:  Normal effort.  Abd:  No distention.  Other:  Elderly overweight African-American male resting comfortably in no acute distress.  Mild edema surrounding a punctate wound to the right lateral ankle with serous  drainage ED Results / Procedures / Treatments  Labs (all labs ordered are listed, but only abnormal results are displayed) Labs Reviewed  COMPREHENSIVE METABOLIC PANEL - Abnormal; Notable for the following components:      Result Value   CO2 20 (*)    AST 13 (*)    All other components within normal limits  CBC WITH DIFFERENTIAL/PLATELET - Abnormal; Notable for the following components:   RBC 4.12 (*)    Hemoglobin 12.9 (*)    All other components within normal limits  LACTIC ACID, PLASMA   RADIOLOGY ED MD interpretation: X-ray of the right ankle interpreted independently by me and shows no radiographic signs of acute osteomyelitis -Agree with radiology assessment Official radiology report(s): DG Ankle Complete Right  Result Date: 02/08/2023 CLINICAL DATA:  swelling and drainage to punctate wound over lateral right ankle EXAM: RIGHT ANKLE - COMPLETE 3+ VIEW COMPARISON:  01/15/2023. FINDINGS: Redemonstration of extensive postsurgical changes. There is open reduction and internal fixation of distal right fibula along with transsyndesmotic screw. The fracture fragments are malaligned. The orientation of the hardware is essentially unchanged. There is complete arthrodesis of  the ankle joint. No acute fracture or dislocation. No aggressive osseous lesion. Calcaneal spur noted along the Achilles tendon and Plantar aponeurosis attachment sites. There is mild asymmetric soft tissue swelling overlying the lateral malleolus. However, no discrete soft tissue defect seen. No evidence of air within the soft tissue. No radiographic signs of acute osteomyelitis. Correlate clinically to determine the need for further imaging with MRI exam. No radiopaque foreign bodies. IMPRESSION: No radiographic signs of acute osteomyelitis. If there is high clinical concern for osteomyelitis, MRI is recommended. Electronically Signed   By: Jules Schick M.D.   On: 02/08/2023 11:36   PROCEDURES: Critical Care performed:  No .1-3 Lead EKG Interpretation  Performed by: Merwyn Katos, MD Authorized by: Merwyn Katos, MD     Interpretation: normal     ECG rate:  71   ECG rate assessment: normal     Rhythm: sinus rhythm     Ectopy: none     Conduction: normal    MEDICATIONS ORDERED IN ED: Medications  sodium chloride 0.9 % bolus 1,000 mL (0 mLs Intravenous Stopped 02/08/23 1206)  clindamycin (CLEOCIN) IVPB 600 mg (0 mg Intravenous Stopped 02/08/23 1206)   IMPRESSION / MDM / ASSESSMENT AND PLAN / ED COURSE  I reviewed the triage vital signs and the nursing notes.                             The patient is on the cardiac monitor to evaluate for evidence of arrhythmia and/or significant heart rate changes. Patient's presentation is most consistent with acute presentation with potential threat to life or bodily function. Presentation most consistent with simple cellulitis. Given History, Exam, and Workup I have low suspicion for Necrotizing Fasciitis, Abscess, Osteomyelitis, DVT or other emergent problem as a cause for this presentation.  Rx: Clindamycin Podiatry follow-up Disposition: Discharge. No evidence of serious bacterial illness. Nontoxic appearing, VSS. Low risk for treatment failure based on history. Strict return precautions discussed with patient with full understanding. Advised patient to follow up promptly with primary care provider within next 48 hours.   FINAL CLINICAL IMPRESSION(S) / ED DIAGNOSES   Final diagnoses:  Visit for wound check   Rx / DC Orders   ED Discharge Orders          Ordered    clindamycin (CLEOCIN) 300 MG capsule  2 times daily        02/08/23 1147           Note:  This document was prepared using Dragon voice recognition software and may include unintentional dictation errors.   Merwyn Katos, MD 02/08/23 [phone]

## 2023-02-19 ENCOUNTER — Other Ambulatory Visit: Payer: Self-pay

## 2023-02-19 ENCOUNTER — Emergency Department
Admission: EM | Admit: 2023-02-19 | Discharge: 2023-02-20 | Disposition: A | Discharge status: Home / Self Care | Payer: Medicare HMO | Attending: Emergency Medicine | Admitting: Emergency Medicine

## 2023-02-19 DIAGNOSIS — R45851 Suicidal ideations: Secondary | ICD-10-CM | POA: Diagnosis not present

## 2023-02-19 DIAGNOSIS — R457 State of emotional shock and stress, unspecified: Secondary | ICD-10-CM | POA: Diagnosis not present

## 2023-02-19 DIAGNOSIS — F4320 Adjustment disorder, unspecified: Secondary | ICD-10-CM | POA: Diagnosis not present

## 2023-02-19 DIAGNOSIS — E119 Type 2 diabetes mellitus without complications: Secondary | ICD-10-CM | POA: Insufficient documentation

## 2023-02-19 NOTE — ED Triage Notes (Signed)
Pt to ed from home via [facility] for possible psych eval. [employer] called EMS. Pt has no medical complaints. Pt states "my girlfriend broke up with me and I need to see someone about it". Pt denies SI/HI at this time.

## 2023-02-19 NOTE — ED Notes (Signed)
Patient received from triage and placed into ER stretcher. Is currently pleasant and acting appropriate. Denies SI/HI to this RN.

## 2023-02-19 NOTE — ED Notes (Signed)
Patient back to ER hallway, currently alert and oriented. Acting appropriate for age, denies SI/HI. Awaiting provider eval for further.

## 2023-02-19 NOTE — ED Provider Notes (Signed)
[facility] Provider Note   Event Date/Time   First MD Initiated Contact with Patient 02/19/23 1641     (approximate) History  Psychiatric Evaluation  HPI Jeremy [name] is a 67 y.o. male with a past medical history of type 2 diabetes who presents via EMS after calling law enforcement due to emotional distress.  Patient states "my girlfriend broke up with me and I need to see someone about it".  Patient currently denies any SI, HI, AVH. ROS: Patient currently denies any vision changes, tinnitus, difficulty speaking, facial droop, sore throat, chest pain, shortness of breath, abdominal pain, nausea/vomiting/diarrhea, dysuria, or weakness/numbness/paresthesias in any extremity   Physical Exam  Triage Vital Signs: ED Triage Vitals  Encounter Vitals Group     BP 02/19/23 1634 (!) 141/80     Systolic BP Percentile --      Diastolic BP Percentile --      Pulse Rate 02/19/23 1634 86     Resp 02/19/23 1634 16     Temp 02/19/23 1634 98 F (36.7 C)     Temp Source 02/19/23 1634 Oral     SpO2 02/19/23 1634 96 %     Weight 02/19/23 1632 202 lb 13.2 oz (92 kg)     Height 02/19/23 1632 5\' 11"  (1.803 m)     Head Circumference --      Peak Flow --      Pain Score 02/19/23 1632 0     Pain Loc --      Pain Education --      Exclude from Growth Chart --    Most recent vital signs: Vitals:   02/19/23 1634  BP: (!) 141/80  Pulse: 86  Resp: 16  Temp: 98 F (36.7 C)  SpO2: 96%   General: Awake, oriented x4. CV:  Good peripheral perfusion.  Resp:  Normal effort.  Abd:  No distention.  Other:  Elderly overweight African-American male resting comfortably in no acute distress ED Results / Procedures / Treatments  Labs (all labs ordered are listed, but only abnormal results are displayed) Labs Reviewed - No data to display PROCEDURES: Critical Care performed: No Procedures MEDICATIONS ORDERED IN ED: Medications - No data to display IMPRESSION / MDM /  ASSESSMENT AND PLAN / ED COURSE  I reviewed the triage vital signs and the nursing notes.                             The patient is on the cardiac monitor to evaluate for evidence of arrhythmia and/or significant heart rate changes. Patient's presentation is most consistent with acute presentation with potential threat to life or bodily function. Thoughts are linear and organized, and patient has no SI, AH, VH, or HI. Prior suicide attempt: Denies Prior Psychiatric Hospitalizations: Denies  Clinically patient displays no overt toxidrome; they are well appearing, with low suspicion for toxic ingestion given history and exam. Thoughts unlikely 2/2 anemia, hypothyroidism, infection, or ICH.  Consult: Psychiatry to evaluate patient for potential hold for danger to self. Disposition: Care of this patient will be signed out to the oncoming physician at the end of my shift.  All pertinent patient information conveyed and all questions answered.  All further care and disposition decisions will be made by the oncoming physician.   FINAL CLINICAL IMPRESSION(S) / ED DIAGNOSES   Final diagnoses:  Emotional crisis   Rx / DC Orders   ED Discharge Orders  None      Note:  This document was prepared using Dragon voice recognition software and may include unintentional dictation errors.   Merwyn Katos, MD 02/19/23 [phone]

## 2023-02-19 NOTE — ED Notes (Signed)
VOL/pending psych consult 

## 2023-02-19 NOTE — ED Notes (Signed)
TTS at bedside to eval patient

## 2023-02-19 NOTE — BH Assessment (Signed)
Comprehensive Clinical Assessment (CCA) Note  02/19/2023 Taidyn Boesch [id number]  Chief Complaint: Patient is a 67 year old male presenting to [facility] ED voluntarily. Per triage note Pt to ed from home via [facility] for possible psych eval. [employer] called EMS. Pt has no medical complaints. Pt states "my girlfriend broke up with me and I need to see someone about it". Pt denies SI/HI at this time. During assessment patient appears alert and oriented x4, calm and cooperative, mood appears pleasant. Patient reports a breakup with his relationship "I loved her for over 22 years even though she had me locked up for 2 days because of some lies she had on me." Patient report that his girlfriend ended their relationship 5 months ago and since then the patient reports that the stress of it has played physical problems on his heart "I have a heart condition and medication that I take for it." The patient reports having an established primary care doctor that he sees for his medical concerns. Patient reports that since the breakup his girlfriend issued him a restraining order "the police told me to stay 100 feet away and I'm on probation so the judge just told me to leave her alone and that's what I've been doing." Patient reports continued sadness because of the breakup and reported having SI this morning because of it "it was just a thought" and the patient currently denies any present thoughts this evening. Patient reports being a [place] and going to church and listening to [employer] as his way to cope "with the way she's been doing me." Patient denies HI/AH/VH. Chief Complaint  Patient presents with   Psychiatric Evaluation   Visit Diagnosis: Relationship concerns   CCA Screening, Triage and Referral (STR)  Patient Reported Information How did you hear about [place]? Self  Referral name: No data recorded Referral phone number: No data recorded  Whom do you see for routine medical problems? No data  recorded Practice/Facility Name: No data recorded Practice/Facility Phone Number: No data recorded Name of Contact: No data recorded Contact Number: No data recorded Contact Fax Number: No data recorded Prescriber Name: No data recorded Prescriber Address (if known): No data recorded  What Is the Reason for Your Visit/Call Today? Pt to ed from home via [facility] for possible psych eval. [employer] called EMS. Pt has no medical complaints. Pt states "my girlfriend broke up with me and I need to see someone about it". Pt denies SI/HI at this time  How Long Has This Been Causing You Problems? > than 6 months  What Do You Feel Would Help You the Most Today? No data recorded  Have You Recently Been in Any Inpatient Treatment (Hospital/Detox/Crisis Center/28-Day Program)? No data recorded Name/Location of Program/Hospital:No data recorded How Long Were You There? No data recorded When Were You Discharged? No data recorded  Have You Ever Received Services From [facility] Before? No data recorded Who Do You See at [facility]? No data recorded  Have You Recently Had Any Thoughts About Hurting Yourself? Yes  Are You Planning to Commit Suicide/Harm Yourself At This time? No   Have you Recently Had Thoughts About Hurting Someone Karolee Ohs? No  Explanation: No data recorded  Have You Used Any Alcohol or Drugs in the Past 24 Hours? No  How Long Ago Did You Use Drugs or Alcohol? No data recorded What Did You Use and How Much? No data recorded  Do You Currently Have a Therapist/Psychiatrist? No  Name of Therapist/Psychiatrist: No  data recorded  Have You Been Recently Discharged From Any Office Practice or Programs? No  Explanation of Discharge From Practice/Program: No data recorded    CCA Screening Triage Referral Assessment Type of Contact: Face-to-Face  Is this Initial or Reassessment? No data recorded Date Telepsych consult ordered in [facility]:  No data recorded Time Telepsych consult ordered in  [facility]:  No data recorded  Patient Reported Information Reviewed? No data recorded Patient Left Without Being Seen? No data recorded Reason for Not Completing Assessment: No data recorded  Collateral Involvement: No data recorded  Does Patient Have a Court Appointed Legal Guardian? No data recorded Name and Contact of Legal Guardian: No data recorded If Minor and Not Living with Parent(s), Who has Custody? No data recorded Is CPS involved or ever been involved? Never  Is APS involved or ever been involved? Never   Patient Determined To Be At Risk for Harm To Self or Others Based on Review of Patient Reported Information or Presenting Complaint? No  Method: No data recorded Availability of Means: No data recorded Intent: No data recorded Notification Required: No data recorded Additional Information for Danger to Others Potential: No data recorded Additional Comments for Danger to Others Potential: No data recorded Are There Guns or Other Weapons in Your Home? No  Types of Guns/Weapons: No data recorded Are These Weapons Safely Secured?                            No data recorded Who Could Verify You Are Able To Have These Secured: No data recorded Do You Have any Outstanding Charges, Pending Court Dates, Parole/Probation? No data recorded Contacted To Inform of Risk of Harm To Self or Others: No data recorded  Location of Assessment: [facility] ED   Does Patient Present under Involuntary Commitment? No  IVC Papers Initial File Date: No data recorded  [place] of Residence: [place]   Patient Currently Receiving the Following Services: No data recorded  Determination of Need: Emergent (2 hours)   Options For Referral: No data recorded    CCA Biopsychosocial Intake/Chief Complaint:  No data recorded Current Symptoms/Problems: No data recorded  Patient Reported Schizophrenia/Schizoaffective Diagnosis in Past: No   Strengths: Patient is able to communicate his  needs  Preferences: No data recorded Abilities: No data recorded  Type of Services Patient Feels are Needed: No data recorded  Initial Clinical Notes/Concerns: No data recorded  Mental Health Symptoms Depression:   Change in energy/activity; Hopelessness; Irritability   Duration of Depressive symptoms:  Greater than two weeks   Mania:   None   Anxiety:    Restlessness; Worrying   Psychosis:   None   Duration of Psychotic symptoms: No data recorded  Trauma:   None   Obsessions:   None   Compulsions:   None   Inattention:   None   Hyperactivity/Impulsivity:   None   Oppositional/Defiant Behaviors:   None   Emotional Irregularity:   None   Other Mood/Personality Symptoms:  No data recorded   Mental Status Exam Appearance and self-care  Stature:   Average   Weight:   Overweight   Clothing:   Casual   Grooming:   Normal   Cosmetic use:   None   Posture/gait:   Normal   Motor activity:   Not Remarkable   Sensorium  Attention:   Normal   Concentration:   Normal   Orientation:   X5   Recall/memory:  Normal   Affect and Mood  Affect:   Appropriate   Mood:   Other (Comment)   Relating  Eye contact:   Normal   Facial expression:   Responsive   Attitude toward examiner:   Cooperative   Thought and Language  Speech flow:  Clear and Coherent   Thought content:   Appropriate to Mood and Circumstances   Preoccupation:   None   Hallucinations:   None   Organization:  No data recorded  [employer] of Knowledge:   Fair   Intelligence:   Average   Abstraction:   Normal   Judgement:   Fair   Dance movement psychotherapist:   Adequate   Insight:   Good   Decision Making:   Normal   Social Functioning  Social Maturity:   Responsible   Social Judgement:   Normal   Stress  Stressors:   Grief/losses; Relationship   Coping Ability:   Normal   Skill Deficits:   None   Supports:   Family      Religion: Religion/Spirituality Are You A Religious Person?: No  Leisure/Recreation: Leisure / Recreation Do You Have Hobbies?: No  Exercise/Diet: Exercise/Diet Do You Exercise?: No Have You Gained or Lost A Significant Amount of Weight in the Past Six Months?: No Do You Follow a Special Diet?: No Do You Have Any Trouble Sleeping?: No   CCA Employment/Education Employment/Work Situation: Employment / Work 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 has Been Impacted by Current Illness: No Has Patient ever Been in Equities trader?: No  Education: Education Is Patient Currently Attending School?: No Did You Have An Individualized Education Program (IIEP): No Did You Have Any Difficulty At [employer]?: No Patient's Education Has Been Impacted by Current Illness: No   CCA Family/Childhood History Family and Relationship History: Family history Marital status: Single Does patient have children?: No  Childhood History:  Childhood History Did patient suffer any verbal/emotional/physical/sexual abuse as a child?: No Did patient suffer from severe childhood neglect?: No Has patient ever been sexually abused/assaulted/raped as an adolescent or adult?: No Was the patient ever a victim of a crime or a disaster?: No Witnessed domestic violence?: No Has patient been affected by domestic violence as an adult?: No  Child/Adolescent Assessment:     CCA Substance Use Alcohol/Drug Use: Alcohol / Drug Use Pain Medications: see mar Prescriptions: see mar Over the Counter: see mar History of alcohol / drug use?: No history of alcohol / drug abuse                         ASAM's:  Six Dimensions of Multidimensional Assessment  Dimension 1:  Acute Intoxication and/or Withdrawal Potential:      Dimension 2:  Biomedical Conditions and Complications:      Dimension 3:  Emotional, Behavioral, or Cognitive Conditions and Complications:     Dimension 4:   Readiness to Change:     Dimension 5:  Relapse, Continued use, or Continued Problem Potential:     Dimension 6:  Recovery/Living Environment:     ASAM Severity Score:    ASAM Recommended Level of Treatment:     Substance use Disorder (SUD)    Recommendations for Services/Supports/Treatments:    DSM5 Diagnoses: Patient Active Problem List   Diagnosis Date Noted   Acute ST elevation myocardial infarction (STEMI) of posterior wall (HCC) 11/24/2022   HFrEF (heart failure with reduced ejection fraction) (HCC) 11/24/2022   PAF (paroxysmal atrial  fibrillation) (HCC) 11/24/2022   OSA (obstructive sleep apnea) 11/24/2022   Coronary artery disease involving native coronary artery of native heart 11/24/2022   Polysubstance abuse (HCC) 11/24/2022   Hyperlipidemia 11/24/2022   Aortic atherosclerosis (HCC) 07/16/2021   Obesity (BMI 30-39.9) 07/16/2021   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/2021   Stage 3 chronic kidney disease (HCC) 07/16/2021   On mechanically assisted ventilation (HCC) 12/28/2020   Atrial fibrillation with RVR (HCC) 12/28/2020   Acute delirium 12/28/2020   Tobacco abuse 12/28/2020   Diabetes mellitus, type 2 (HCC) 12/28/2020   NSTEMI (non-ST elevated myocardial infarction) (HCC)    Acute respiratory failure with hypoxia (HCC)    Bilateral pulmonary embolism (HCC) 12/22/2020   History of cocaine use 11/26/2019   History of marijuana use 11/26/2019   History of illicit drug use 11/26/2019   Pharmacologic therapy 11/26/2019   Acute pain (now resolved) 11/26/2019   MRSA (methicillin resistant Staphylococcus aureus) infection 10/18/2019   Hyperosmolar non-ketotic state due to type 2 diabetes mellitus (HCC) 08/21/2019   Hyponatremia 08/19/2019   AKI (acute kidney injury) (HCC) 08/19/2019   Essential hypertension 08/19/2019   Medically noncompliant 08/19/2019   Post-traumatic osteoarthritis of right ankle 10/23/2018   Skin ulcer of right ankle with fat layer exposed (HCC) 10/23/2018   Status  post ORIF of fracture of ankle 10/23/2018   Diabetic foot infection (HCC) 10/22/2018   Pressure injury of skin 02/11/2018    Patient Centered Plan: Patient is on the following Treatment Plan(s):  Anxiety   Referrals to Alternative Service(s): Referred to Alternative Service(s):   Place:   Date:   Time:    Referred to Alternative Service(s):   Place:   Date:   Time:    Referred to Alternative Service(s):   Place:   Date:   Time:    Referred to Alternative Service(s):   Place:   Date:   Time:      @BHCOLLABOFCARE @  [employer], LCAS-A

## 2023-02-20 NOTE — Discharge Instructions (Signed)
Return to the ER for worsening symptoms, feelings of hurting yourself or others, or other concerns. 

## 2023-02-20 NOTE — ED Provider Notes (Signed)
-----------------------------------------   4:51 AM on 02/20/2023 -----------------------------------------   Patient evaluated by overnight psychiatry team who deems patient psychiatrically stable for discharge home with outpatient follow-up.  Strict return precautions given.  Patient verbalizes understanding and agrees with plan of care.   Irean Hong, MD 02/20/23 [phone]

## 2023-02-20 NOTE — Progress Notes (Signed)
Reviewed patient with TTS counselor Andee Poles, LCAS. per her report/note patient reports increased stress due to his long term relationship ending. Patient denies acyive suicidal/homicdal ideation, he denies past hx of suicidal attempt and AVH. Patient is psychiatrically clear for discharge. Recommended outpatient follow up with therapist/counselor.

## 2023-02-21 NOTE — Group Note (Deleted)

## 2023-02-24 ENCOUNTER — Emergency Department
Admission: EM | Admit: 2023-02-24 | Discharge: 2023-02-24 | Disposition: A | Discharge status: Home / Self Care | Payer: Medicare HMO | Attending: Emergency Medicine | Admitting: Emergency Medicine

## 2023-02-24 ENCOUNTER — Other Ambulatory Visit: Payer: Self-pay

## 2023-02-24 ENCOUNTER — Emergency Department: Payer: Medicare HMO

## 2023-02-24 DIAGNOSIS — I2510 Atherosclerotic heart disease of native coronary artery without angina pectoris: Secondary | ICD-10-CM | POA: Diagnosis not present

## 2023-02-24 DIAGNOSIS — S91001A Unspecified open wound, right ankle, initial encounter: Secondary | ICD-10-CM | POA: Insufficient documentation

## 2023-02-24 DIAGNOSIS — T148XXA Other injury of unspecified body region, initial encounter: Secondary | ICD-10-CM

## 2023-02-24 DIAGNOSIS — X58XXXA Exposure to other specified factors, initial encounter: Secondary | ICD-10-CM | POA: Diagnosis not present

## 2023-02-24 DIAGNOSIS — S82401A Unspecified fracture of shaft of right fibula, initial encounter for closed fracture: Secondary | ICD-10-CM | POA: Diagnosis not present

## 2023-02-24 DIAGNOSIS — N183 Chronic kidney disease, stage 3 unspecified: Secondary | ICD-10-CM | POA: Insufficient documentation

## 2023-02-24 DIAGNOSIS — I5020 Unspecified systolic (congestive) heart failure: Secondary | ICD-10-CM | POA: Diagnosis not present

## 2023-02-24 DIAGNOSIS — I10 Essential (primary) hypertension: Secondary | ICD-10-CM | POA: Diagnosis not present

## 2023-02-24 DIAGNOSIS — E1122 Type 2 diabetes mellitus with diabetic chronic kidney disease: Secondary | ICD-10-CM | POA: Diagnosis not present

## 2023-02-24 DIAGNOSIS — I130 Hypertensive heart and chronic kidney disease with heart failure and stage 1 through stage 4 chronic kidney disease, or unspecified chronic kidney disease: Secondary | ICD-10-CM | POA: Insufficient documentation

## 2023-02-24 DIAGNOSIS — M7731 Calcaneal spur, right foot: Secondary | ICD-10-CM | POA: Diagnosis not present

## 2023-02-24 DIAGNOSIS — M25571 Pain in right ankle and joints of right foot: Secondary | ICD-10-CM | POA: Diagnosis not present

## 2023-02-24 NOTE — Discharge Instructions (Signed)
It is very important that you follow-up with Dr. Excell Seltzer with podiatry for further management of your ankle.  It is very important that you see him soon so that you do not develop an infection.  Right now you do not have an infection and Dr. Excell Seltzer does not want to start you on antibiotics.  You may take Tylenol/ibuprofen per package instructions as needed for pain.  Please return for any new, worsening, or changing symptoms or other concerns.  It was a pleasure caring for you today.

## 2023-02-24 NOTE — ED Triage Notes (Signed)
See first nurse note: No new injury noted by pt.

## 2023-02-24 NOTE — ED Provider Notes (Signed)
[facility] Provider Note    Event Date/Time   First MD Initiated Contact with Patient 02/24/23 1256     (approximate)   History   Ankle Pain   HPI  Jeremy [name] is a 67 y.o. male with a past medical history of atrial fibrillation, CKD, medical noncompliance, diabetes who presents today for evaluation of right ankle pain.  Patient reports that he had surgery on his ankle decades ago and approximately 4 years ago a screw came out.  He is not noticed anything different over the last few days but thinks that he needs antibiotics.  No fevers or chills.  He is able to ambulate.  He has not seen a doctor in many years for his foot.  Patient Active Problem List   Diagnosis Date Noted   Acute ST elevation myocardial infarction (STEMI) of posterior wall (HCC) 11/24/2022   HFrEF (heart failure with reduced ejection fraction) (HCC) 11/24/2022   PAF (paroxysmal atrial fibrillation) (HCC) 11/24/2022   OSA (obstructive sleep apnea) 11/24/2022   Coronary artery disease involving native coronary artery of native heart 11/24/2022   Polysubstance abuse (HCC) 11/24/2022   Hyperlipidemia 11/24/2022   Aortic atherosclerosis (HCC) 07/16/2021   Obesity (BMI 30-39.9) 07/16/2021   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/2021   Stage 3 chronic kidney disease (HCC) 07/16/2021   On mechanically assisted ventilation (HCC) 12/28/2020   Atrial fibrillation with RVR (HCC) 12/28/2020   Acute delirium 12/28/2020   Tobacco abuse 12/28/2020   Diabetes mellitus, type 2 (HCC) 12/28/2020   NSTEMI (non-ST elevated myocardial infarction) (HCC)    Acute respiratory failure with hypoxia (HCC)    Bilateral pulmonary embolism (HCC) 12/22/2020   History of cocaine use 11/26/2019   History of marijuana use 11/26/2019   History of illicit drug use 11/26/2019   Pharmacologic therapy 11/26/2019   Acute pain (now resolved) 11/26/2019   MRSA (methicillin resistant Staphylococcus aureus) infection 10/18/2019    Hyperosmolar non-ketotic state due to type 2 diabetes mellitus (HCC) 08/21/2019   Hyponatremia 08/19/2019   AKI (acute kidney injury) (HCC) 08/19/2019   Essential hypertension 08/19/2019   Medically noncompliant 08/19/2019   Post-traumatic osteoarthritis of right ankle 10/23/2018   Skin ulcer of right ankle with fat layer exposed (HCC) 10/23/2018   Status post ORIF of fracture of ankle 10/23/2018   Diabetic foot infection (HCC) 10/22/2018   Pressure injury of skin 02/11/2018          Physical Exam   Triage Vital Signs: ED Triage Vitals  Encounter Vitals Group     BP 02/24/23 1239 (!) 135/99     Systolic BP Percentile --      Diastolic BP Percentile --      Pulse Rate 02/24/23 1239 (!) 110     Resp --      Temp 02/24/23 1239 (!) 97.5 F (36.4 C)     Temp Source 02/24/23 1239 Oral     SpO2 02/24/23 1239 98 %     Weight 02/24/23 1252 202 lb 13.2 oz (92 kg)     Height 02/24/23 1252 5\' 11"  (1.803 m)     Head Circumference --      Peak Flow --      Pain Score 02/24/23 1238 8     Pain Loc --      Pain Education --      Exclude from Growth Chart --     Most recent vital signs: Vitals:   02/24/23 1239 02/24/23 1335  BP: Marland Kitchen)  135/99 (!) 151/77  Pulse: (!) 110 86  Resp:  16  Temp: (!) 97.5 F (36.4 C) 97.7 F (36.5 C)  SpO2: 98% 100%    Physical Exam Vitals and nursing note reviewed.  Constitutional:      General: Awake and alert. No acute distress.    Appearance: Normal appearance. The patient is normal weight.  HENT:     Head: Normocephalic and atraumatic.     Mouth: Mucous membranes are moist.  Eyes:     General: PERRL. Normal EOMs        Right eye: No discharge.        Left eye: No discharge.     Conjunctiva/sclera: Conjunctivae normal.  Cardiovascular:     Rate and Rhythm: Normal rate and regular rhythm.     Pulses: Normal pulses.  Pulmonary:     Effort: Pulmonary effort is normal. No respiratory distress.     Breath sounds: Normal breath sounds.   Abdominal:     Abdomen is soft. There is no abdominal tenderness. No rebound or guarding. No distention. Musculoskeletal:        General: No swelling. Normal range of motion.     Cervical back: Normal range of motion and neck supple.  Right ankle: Dry flaky skin to bilateral lower extremities.  There is a small ulceration over the lateral malleolus with no active drainage.  Nontender to palpation.  Able to plantarflex and dorsiflex at the ankle.  Sensation intact light touch throughout.  No warmth.   Skin:    General: Skin is warm and dry.     Capillary Refill: Capillary refill takes less than 2 seconds.     Findings: No rash.  Neurological:     Mental Status: The patient is awake and alert.      ED Results / Procedures / Treatments   Labs (all labs ordered are listed, but only abnormal results are displayed) Labs Reviewed - No data to display   EKG     RADIOLOGY I independently reviewed and interpreted imaging and agree with radiologists findings.     PROCEDURES:  Critical Care performed:   Procedures   MEDICATIONS ORDERED IN ED: Medications - No data to display   IMPRESSION / MDM / ASSESSMENT AND PLAN / ED COURSE  I reviewed the triage vital signs and the nursing notes.   Differential diagnosis includes, but is not limited to, nonhealing wound, hardware infection, osteomyelitis.  I reviewed the patient's chart.  Patient had a CT of his right ankle last month that revealed hardware loosening though no acute infection.  I am unable to see the records from his original surgery at [facility].  He has been seen multiple times for this problem.  Patient is awake and alert, hemodynamically stable and afebrile.  He is nontoxic in appearance.  His wound has been present for 4 years.  There is no surrounding erythema, there is no active discharge, there is no warmth, and he has full normal range of motion of his ankle, I do not suspect hardware infection or osteomyelitis.  I  discussed the case and showed the pictures and the x-rays to Dr. Excell Seltzer with podiatry who agrees that this appears to be stable but will require outpatient management.  He did not recommend that the patient be started on antibiotics at this time given the chronic appearing nature of the wound.  Discussed these findings recommendations with the patient and he agrees to follow-up with Dr. Excell Seltzer.  The appropriate follow-up  information was provided.  In the meantime, we discussed strict return precautions.  I emphasized the importance of close outpatient follow-up.  Patient understands and agrees with plan.  He was discharged in stable condition.   Patient's presentation is most consistent with acute presentation with potential threat to life or bodily function.   Clinical Course as of 02/24/23 1436  Thu Feb 24, 2023  1304 DG Ankle 2 Views Right [MM]  1308 DG Ankle 2 Views Right [MM]  1327 Discussed with Dr. Excell Seltzer with vascular surgery who reviewed the x-ray and the photo and feels that this looks stable and not acutely infected and he will see the patient in outpatient follow-up [JP]    Clinical Course User Index [JP] Naidelin Gugliotta, Herb Grays, PA-C [MM] Minor, Denice Paradise     FINAL CLINICAL IMPRESSION(S) / ED DIAGNOSES   Final diagnoses:  Acute right ankle pain  Chronic wound     Rx / DC Orders   ED Discharge Orders     None        Note:  This document was prepared using Dragon voice recognition software and may include unintentional dictation errors.   Keturah Shavers 02/24/23 1436    [name], MD 02/24/23 [phone]

## 2023-02-24 NOTE — ED Notes (Signed)
First nurse note: Pt here via [facility] with /co of R ankle pain, pt had surgery and thinks "the hardware slipped". NAD noted.

## 2023-03-14 ENCOUNTER — Emergency Department
Admission: EM | Admit: 2023-03-14 | Discharge: 2023-03-15 | Disposition: A | Discharge status: Other Acute Inpatient Hospital | Payer: Medicare HMO | Attending: Emergency Medicine | Admitting: Emergency Medicine

## 2023-03-14 ENCOUNTER — Emergency Department: Payer: Medicare HMO

## 2023-03-14 ENCOUNTER — Other Ambulatory Visit: Payer: Self-pay

## 2023-03-14 DIAGNOSIS — Z955 Presence of coronary angioplasty implant and graft: Secondary | ICD-10-CM | POA: Insufficient documentation

## 2023-03-14 DIAGNOSIS — R079 Chest pain, unspecified: Secondary | ICD-10-CM | POA: Diagnosis not present

## 2023-03-14 DIAGNOSIS — T887XXA Unspecified adverse effect of drug or medicament, initial encounter: Secondary | ICD-10-CM | POA: Diagnosis not present

## 2023-03-14 DIAGNOSIS — I6782 Cerebral ischemia: Secondary | ICD-10-CM | POA: Diagnosis not present

## 2023-03-14 DIAGNOSIS — Z79899 Other long term (current) drug therapy: Secondary | ICD-10-CM | POA: Insufficient documentation

## 2023-03-14 DIAGNOSIS — T50904A Poisoning by unspecified drugs, medicaments and biological substances, undetermined, initial encounter: Secondary | ICD-10-CM | POA: Diagnosis not present

## 2023-03-14 DIAGNOSIS — I2510 Atherosclerotic heart disease of native coronary artery without angina pectoris: Secondary | ICD-10-CM | POA: Diagnosis not present

## 2023-03-14 DIAGNOSIS — X58XXXA Exposure to other specified factors, initial encounter: Secondary | ICD-10-CM | POA: Diagnosis not present

## 2023-03-14 DIAGNOSIS — F4321 Adjustment disorder with depressed mood: Secondary | ICD-10-CM | POA: Insufficient documentation

## 2023-03-14 DIAGNOSIS — I10 Essential (primary) hypertension: Secondary | ICD-10-CM | POA: Insufficient documentation

## 2023-03-14 DIAGNOSIS — S0990XA Unspecified injury of head, initial encounter: Secondary | ICD-10-CM | POA: Diagnosis not present

## 2023-03-14 DIAGNOSIS — E049 Nontoxic goiter, unspecified: Secondary | ICD-10-CM | POA: Diagnosis not present

## 2023-03-14 DIAGNOSIS — R0602 Shortness of breath: Secondary | ICD-10-CM | POA: Diagnosis not present

## 2023-03-14 DIAGNOSIS — R000 Tachycardia, unspecified: Secondary | ICD-10-CM | POA: Insufficient documentation

## 2023-03-14 DIAGNOSIS — F29 Unspecified psychosis not due to a substance or known physiological condition: Secondary | ICD-10-CM | POA: Insufficient documentation

## 2023-03-14 DIAGNOSIS — E119 Type 2 diabetes mellitus without complications: Secondary | ICD-10-CM | POA: Insufficient documentation

## 2023-03-14 DIAGNOSIS — S199XXA Unspecified injury of neck, initial encounter: Secondary | ICD-10-CM | POA: Diagnosis not present

## 2023-03-14 LAB — CBC
HCT: 43.5 % (ref 39.0–52.0)
Hemoglobin: 14.1 g/dL (ref 13.0–17.0)
MCH: 31.2 pg (ref 26.0–34.0)
MCHC: 32.4 g/dL (ref 30.0–36.0)
MCV: 96.2 fL (ref 80.0–100.0)
Platelets: 307 10*3/uL (ref 150–400)
RBC: 4.52 MIL/uL (ref 4.22–5.81)
RDW: 15.2 % (ref 11.5–15.5)
WBC: 5 10*3/uL (ref 4.0–10.5)
nRBC: 0 % (ref 0.0–0.2)

## 2023-03-14 LAB — COMPREHENSIVE METABOLIC PANEL
ALT: 7 U/L (ref 0–44)
AST: 11 U/L — ABNORMAL LOW (ref 15–41)
Albumin: 3.7 g/dL (ref 3.5–5.0)
Alkaline Phosphatase: 60 U/L (ref 38–126)
Anion gap: 10 (ref 5–15)
BUN: 7 mg/dL — ABNORMAL LOW (ref 8–23)
CO2: 21 mmol/L — ABNORMAL LOW (ref 22–32)
Calcium: 9.1 mg/dL (ref 8.9–10.3)
Chloride: 112 mmol/L — ABNORMAL HIGH (ref 98–111)
Creatinine, Ser: 1.09 mg/dL (ref 0.61–1.24)
GFR, Estimated: >60 mL/min (ref >60)
Glucose, Bld: 89 mg/dL (ref 70–99)
Potassium: 4.1 mmol/L (ref 3.5–5.1)
Sodium: 143 mmol/L (ref 135–145)
Total Bilirubin: 0.8 mg/dL (ref 0.3–1.2)
Total Protein: 7.3 g/dL (ref 6.5–8.1)

## 2023-03-14 LAB — URINE DRUG SCREEN, QUALITATIVE (ARMC ONLY)
Amphetamines, Ur Screen: NOT DETECTED
Barbiturates, Ur Screen: NOT DETECTED
Benzodiazepine, Ur Scrn: NOT DETECTED
Cannabinoid 50 Ng, Ur ~~LOC~~: POSITIVE — AB
Cocaine Metabolite,Ur ~~LOC~~: NOT DETECTED
MDMA (Ecstasy)Ur Screen: NOT DETECTED
Methadone Scn, Ur: NOT DETECTED
Opiate, Ur Screen: NOT DETECTED
Phencyclidine (PCP) Ur S: NOT DETECTED
Tricyclic, Ur Screen: NOT DETECTED

## 2023-03-14 LAB — ACETAMINOPHEN LEVEL: Acetaminophen (Tylenol), Serum: <10 ug/mL — ABNORMAL LOW (ref 10–30)

## 2023-03-14 LAB — MAGNESIUM: Magnesium: 2.1 mg/dL (ref 1.7–2.4)

## 2023-03-14 LAB — TROPONIN I (HIGH SENSITIVITY): Troponin I (High Sensitivity): 13 ng/L (ref <18)

## 2023-03-14 LAB — SALICYLATE LEVEL: Salicylate Lvl: <7 mg/dL — ABNORMAL LOW (ref 7.0–30.0)

## 2023-03-14 LAB — ETHANOL: Alcohol, Ethyl (B): <10 mg/dL (ref <10)

## 2023-03-14 MED ORDER — METFORMIN HCL 500 MG PO TABS
1000.0000 mg | ORAL_TABLET | Freq: Every day | ORAL | Status: DC
  Administered 2023-03-15: 1000 mg via ORAL
  Filled 2023-03-14: qty 2

## 2023-03-14 MED ORDER — METOPROLOL SUCCINATE ER 25 MG PO TB24
25.0000 mg | ORAL_TABLET | Freq: Every day | ORAL | Status: DC
  Administered 2023-03-14 – 2023-03-15 (×2): 25 mg via ORAL
  Filled 2023-03-14 (×2): qty 1

## 2023-03-14 MED ORDER — RIVAROXABAN 20 MG PO TABS
20.0000 mg | ORAL_TABLET | Freq: Every day | ORAL | Status: DC
  Administered 2023-03-14: 20 mg via ORAL
  Filled 2023-03-14 (×2): qty 1

## 2023-03-14 MED ORDER — LOSARTAN POTASSIUM 50 MG PO TABS
50.0000 mg | ORAL_TABLET | Freq: Every day | ORAL | Status: DC
  Administered 2023-03-14 – 2023-03-15 (×2): 50 mg via ORAL
  Filled 2023-03-14 (×2): qty 1

## 2023-03-14 NOTE — ED Notes (Addendum)
Pt belongings:  1 blue shirt 1 pair of jeans 1 pair of blue sneakers 1 black cell phone 1 green bracelet 1 black belt 1 pair of blue underwear  Pt also has 1 crutch.

## 2023-03-14 NOTE — BH Assessment (Signed)
Adult/GERO MH  Referral information for Psychiatric Hospitalization faxed to:   [facility] ([phone]---[phone])   [facility] [phone]- [phone], [phone])   [facility] [phone] or [phone]),  . [facility] [phone] -or- [phone]),
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
## 2023-03-14 NOTE — ED Notes (Signed)
Per EDP C. Jessup, pt needs to be placed on cardiac monitor. Consulting civil engineer notified. Pt moved to room 24. Report to Vet, Charity fundraiser. Pt placed on monitor.

## 2023-03-14 NOTE — ED Notes (Signed)
Pt received dinner. 
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
## 2023-03-14 NOTE — ED Notes (Signed)
Pt given PM snack.  
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
## 2023-03-14 NOTE — ED Notes (Signed)
VOL/  PENDING  CONSULT 

## 2023-03-15 DIAGNOSIS — I2510 Atherosclerotic heart disease of native coronary artery without angina pectoris: Secondary | ICD-10-CM | POA: Diagnosis not present

## 2023-03-15 DIAGNOSIS — L03115 Cellulitis of right lower limb: Secondary | ICD-10-CM | POA: Diagnosis not present

## 2023-03-15 DIAGNOSIS — Z734 Inadequate social skills, not elsewhere classified: Secondary | ICD-10-CM | POA: Diagnosis not present

## 2023-03-15 DIAGNOSIS — K219 Gastro-esophageal reflux disease without esophagitis: Secondary | ICD-10-CM | POA: Diagnosis not present

## 2023-03-15 DIAGNOSIS — G4700 Insomnia, unspecified: Secondary | ICD-10-CM | POA: Diagnosis not present

## 2023-03-15 DIAGNOSIS — I10 Essential (primary) hypertension: Secondary | ICD-10-CM | POA: Diagnosis not present

## 2023-03-15 DIAGNOSIS — Z955 Presence of coronary angioplasty implant and graft: Secondary | ICD-10-CM | POA: Diagnosis not present

## 2023-03-15 DIAGNOSIS — R0602 Shortness of breath: Secondary | ICD-10-CM | POA: Diagnosis not present

## 2023-03-15 DIAGNOSIS — R45851 Suicidal ideations: Secondary | ICD-10-CM | POA: Diagnosis not present

## 2023-03-15 DIAGNOSIS — F4321 Adjustment disorder with depressed mood: Secondary | ICD-10-CM | POA: Diagnosis not present

## 2023-03-15 DIAGNOSIS — F4322 Adjustment disorder with anxiety: Secondary | ICD-10-CM | POA: Diagnosis not present

## 2023-03-15 DIAGNOSIS — R000 Tachycardia, unspecified: Secondary | ICD-10-CM | POA: Diagnosis not present

## 2023-03-15 DIAGNOSIS — E119 Type 2 diabetes mellitus without complications: Secondary | ICD-10-CM | POA: Diagnosis not present

## 2023-03-15 DIAGNOSIS — S0990XA Unspecified injury of head, initial encounter: Secondary | ICD-10-CM | POA: Diagnosis not present

## 2023-03-15 DIAGNOSIS — S91001A Unspecified open wound, right ankle, initial encounter: Secondary | ICD-10-CM | POA: Diagnosis not present

## 2023-03-15 DIAGNOSIS — R079 Chest pain, unspecified: Secondary | ICD-10-CM | POA: Diagnosis not present

## 2023-03-15 NOTE — ED Notes (Addendum)
Ambulated to [employer] car. 1 bag of belongings sent with. All of  paperwork given to [employer] car.

## 2023-03-15 NOTE — BH Assessment (Signed)
Per [facility], patient to be referred out of system.  Referral information for Psychiatric Hospitalization faxed to;   [facility] [phone]) No available beds  [facility] [phone]),   [facility] [phone]) [phone]  [facility] [phone]),  [facility] [phone] -or- [phone]),   [facility] [phone] or [phone]),

## 2023-03-15 NOTE — ED Provider Notes (Signed)
Emergency Medicine Observation Re-evaluation Note  Jeremy [name] is a 67 y.o. male, seen on rounds today.  Pt initially presented to the ED for complaints of Shortness of Breath Currently, the patient is awaiting transfer to [facility]  Physical Exam  BP (!) 179/100   Pulse 62   Temp 97.9 F (36.6 C) (Oral)   Resp (!) 23   Ht 5\' 11"  (1.803 m)   Wt 92 kg   SpO2 96%   BMI 28.29 kg/m  Physical Exam General: resting comfortably  ED Course / MDM  EKG:EKG Interpretation Date/Time:  Monday March 14 2023 09:40:26 EDT Ventricular Rate:  96 PR Interval:    QRS Duration:  86 QT Interval:  370 QTC Calculation: 467 R Axis:   26  Text Interpretation: Atrial flutter with variable A-V block T wave abnormality, consider lateral ischemia Abnormal ECG When compared with ECG of 08-Feb-2023 09:59, PREVIOUS ECG IS PRESENT Confirmed by UNCONFIRMED, DOCTOR ([id number]), editor Kearney Hard ([id number]) on 03/14/2023 11:06:50 AM  I have reviewed the labs performed to date as well as medications administered while in observation.  Plan  Current plan is for transfer to [facility] at 2pm this afternoon     Phineas Semen, MD 03/15/23 [phone]

## 2023-03-15 NOTE — BH Assessment (Signed)
PATIENT BED AVAILABLE AT 2PM ON 03/15/23  Patient has been accepted to [facility].  Accepting physician is Dr. Sherrian Divers.  Call report to [phone].  Representative was [employer].   ER Staff is aware of it:  [facility] ER Secretary  Dr. Derrill Kay, ER MD  [facility] Patient's Nurse

## 2023-03-15 NOTE — ED Notes (Signed)
Hospital meal provided.  100% consumed, pt tolerated w/o complaints.  Waste discarded appropriately.  Pt requested shower; provided clean hospital clothing and linens.  Shower setup provided with soap, shampoo, toothbrush/toothpaste, and deodorant.  Pt able to preform own ADL's with no assistance.

## 2023-03-15 NOTE — ED Notes (Signed)
PATIENT BED AVAILABLE AT 2PM ON 03/15/23   Patient has been accepted to [facility].  Accepting physician is Dr. Sherrian Divers.  Call report to [phone].  Representative was [employer].

## 2023-03-18 ENCOUNTER — Other Ambulatory Visit: Payer: Self-pay

## 2023-03-18 MED ORDER — ISOSORBIDE MONONITRATE ER 30 MG PO TB24
30.0000 mg | ORAL_TABLET | Freq: Two times a day (BID) | ORAL | 0 refills | Status: DC
  Filled 2023-03-18: qty 14, 7d supply, fill #0

## 2023-03-18 MED ORDER — XARELTO 20 MG PO TABS: 20.0000 mg | ORAL_TABLET | Freq: Every day | ORAL | 0 refills | Status: DC

## 2023-03-18 MED ORDER — PANTOPRAZOLE SODIUM 20 MG PO TBEC: 20.0000 mg | DELAYED_RELEASE_TABLET | Freq: Every day | ORAL | 0 refills | Status: DC

## 2023-03-18 MED ORDER — DOXYCYCLINE MONOHYDRATE 100 MG PO CAPS
100.0000 mg | ORAL_CAPSULE | Freq: Two times a day (BID) | ORAL | 0 refills | Status: DC
  Filled 2023-03-18: qty 20, 10d supply, fill #0

## 2023-03-18 MED ORDER — LOSARTAN POTASSIUM 50 MG PO TABS
50.0000 mg | ORAL_TABLET | Freq: Every day | ORAL | 0 refills | Status: DC
  Filled 2023-03-18: qty 14, 14d supply, fill #0

## 2023-03-18 MED ORDER — METFORMIN HCL 1000 MG PO TABS: 1000.0000 mg | ORAL_TABLET | Freq: Two times a day (BID) | ORAL | 0 refills | Status: DC

## 2023-03-18 MED ORDER — METOPROLOL SUCCINATE ER 25 MG PO TB24: 25.0000 mg | ORAL_TABLET | Freq: Every day | ORAL | 0 refills | Status: DC

## 2023-03-22 ENCOUNTER — Emergency Department: Payer: Medicare HMO

## 2023-03-22 ENCOUNTER — Other Ambulatory Visit: Payer: Self-pay

## 2023-03-22 ENCOUNTER — Encounter: Payer: Self-pay | Admitting: Emergency Medicine

## 2023-03-22 ENCOUNTER — Emergency Department
Admission: EM | Admit: 2023-03-22 | Discharge: 2023-03-22 | Disposition: A | Discharge status: Home / Self Care | Payer: Medicare HMO | Attending: Emergency Medicine | Admitting: Emergency Medicine

## 2023-03-22 DIAGNOSIS — I509 Heart failure, unspecified: Secondary | ICD-10-CM | POA: Insufficient documentation

## 2023-03-22 DIAGNOSIS — N183 Chronic kidney disease, stage 3 unspecified: Secondary | ICD-10-CM | POA: Diagnosis not present

## 2023-03-22 DIAGNOSIS — I2510 Atherosclerotic heart disease of native coronary artery without angina pectoris: Secondary | ICD-10-CM | POA: Diagnosis not present

## 2023-03-22 DIAGNOSIS — M25571 Pain in right ankle and joints of right foot: Secondary | ICD-10-CM | POA: Diagnosis not present

## 2023-03-22 DIAGNOSIS — F17200 Nicotine dependence, unspecified, uncomplicated: Secondary | ICD-10-CM | POA: Insufficient documentation

## 2023-03-22 DIAGNOSIS — I130 Hypertensive heart and chronic kidney disease with heart failure and stage 1 through stage 4 chronic kidney disease, or unspecified chronic kidney disease: Secondary | ICD-10-CM | POA: Diagnosis not present

## 2023-03-22 DIAGNOSIS — M7731 Calcaneal spur, right foot: Secondary | ICD-10-CM | POA: Diagnosis not present

## 2023-03-22 DIAGNOSIS — L97319 Non-pressure chronic ulcer of right ankle with unspecified severity: Secondary | ICD-10-CM | POA: Diagnosis not present

## 2023-03-22 DIAGNOSIS — S91001A Unspecified open wound, right ankle, initial encounter: Secondary | ICD-10-CM | POA: Diagnosis not present

## 2023-03-22 DIAGNOSIS — T148XXA Other injury of unspecified body region, initial encounter: Secondary | ICD-10-CM

## 2023-03-22 DIAGNOSIS — E1122 Type 2 diabetes mellitus with diabetic chronic kidney disease: Secondary | ICD-10-CM | POA: Insufficient documentation

## 2023-03-22 DIAGNOSIS — S86301A Unspecified injury of muscle(s) and tendon(s) of peroneal muscle group at lower leg level, right leg, initial encounter: Secondary | ICD-10-CM | POA: Diagnosis not present

## 2023-03-22 DIAGNOSIS — R000 Tachycardia, unspecified: Secondary | ICD-10-CM | POA: Diagnosis not present

## 2023-03-22 DIAGNOSIS — S82401A Unspecified fracture of shaft of right fibula, initial encounter for closed fracture: Secondary | ICD-10-CM | POA: Diagnosis not present

## 2023-03-22 DIAGNOSIS — G8929 Other chronic pain: Secondary | ICD-10-CM | POA: Diagnosis not present

## 2023-03-22 LAB — CBG MONITORING, ED: Glucose-Capillary: 71 mg/dL (ref 70–99)

## 2023-03-22 LAB — LACTIC ACID, PLASMA: Lactic Acid, Venous: 1.8 mmol/L (ref 0.5–1.9)

## 2023-03-22 MED ORDER — DOXYCYCLINE HYCLATE 100 MG PO CAPS
100.0000 mg | ORAL_CAPSULE | Freq: Two times a day (BID) | ORAL | 0 refills | Status: DC
  Filled 2023-03-22: qty 20, 10d supply, fill #0

## 2023-03-22 MED ORDER — MUPIROCIN 2 % EX OINT
1.0000 | TOPICAL_OINTMENT | Freq: Every day | CUTANEOUS | 0 refills | Status: DC
  Filled 2023-03-22: qty 22, 22d supply, fill #0
  Filled 2023-03-22: qty 22, 30d supply, fill #0

## 2023-03-22 NOTE — ED Provider Notes (Signed)
[facility] Provider Note    Event Date/Time   First MD Initiated Contact with Patient 03/22/23 1129     (approximate)   History   Fall   HPI  Jeremy [name] is a 67 y.o. male with a past medical history of atrial fibrillation, heart failure, CKD, medical noncompliance, diabetes who presents today for evaluation of right ankle pain.  He had surgery on his ankle decades ago and approximately 4 years ago he reports that the screw came out.  He has not noticed anything different today.  He has not yet followed up with podiatry.  No fevers or chills.  He is requesting a sandwich.  Patient Active Problem List   Diagnosis Date Noted   Adjustment disorder with depressed mood 03/14/2023   Acute ST elevation myocardial infarction (STEMI) of posterior wall (HCC) 11/24/2022   HFrEF (heart failure with reduced ejection fraction) (HCC) 11/24/2022   PAF (paroxysmal atrial fibrillation) (HCC) 11/24/2022   OSA (obstructive sleep apnea) 11/24/2022   Coronary artery disease involving native coronary artery of native heart 11/24/2022   Polysubstance abuse (HCC) 11/24/2022   Hyperlipidemia 11/24/2022   Aortic atherosclerosis (HCC) 07/16/2021   Obesity (BMI 30-39.9) 07/16/2021   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/2021   Stage 3 chronic kidney disease (HCC) 07/16/2021   On mechanically assisted ventilation (HCC) 12/28/2020   Atrial fibrillation with RVR (HCC) 12/28/2020   Acute delirium 12/28/2020   Tobacco abuse 12/28/2020   Diabetes mellitus, type 2 (HCC) 12/28/2020   NSTEMI (non-ST elevated myocardial infarction) (HCC)    Acute respiratory failure with hypoxia (HCC)    Bilateral pulmonary embolism (HCC) 12/22/2020   History of cocaine use 11/26/2019   History of marijuana use 11/26/2019   History of illicit drug use 11/26/2019   Pharmacologic therapy 11/26/2019   Acute pain (now resolved) 11/26/2019   MRSA (methicillin resistant Staphylococcus aureus) infection 10/18/2019    Hyperosmolar non-ketotic state due to type 2 diabetes mellitus (HCC) 08/21/2019   Hyponatremia 08/19/2019   AKI (acute kidney injury) (HCC) 08/19/2019   Essential hypertension 08/19/2019   Medically noncompliant 08/19/2019   Post-traumatic osteoarthritis of right ankle 10/23/2018   Skin ulcer of right ankle with fat layer exposed (HCC) 10/23/2018   Status post ORIF of fracture of ankle 10/23/2018   Diabetic foot infection (HCC) 10/22/2018   Pressure injury of skin 02/11/2018          Physical Exam   Triage Vital Signs: ED Triage Vitals  Encounter Vitals Group     BP 03/22/23 1023 117/78     Systolic BP Percentile --      Diastolic BP Percentile --      Pulse Rate 03/22/23 1023 (!) 130     Resp 03/22/23 1023 15     Temp 03/22/23 1023 97.6 F (36.4 C)     Temp Source 03/22/23 1023 Oral     SpO2 03/22/23 1023 98 %     Weight 03/22/23 0959 202 lb 13.2 oz (92 kg)     Height 03/22/23 0959 5\' 11"  (1.803 m)     Head Circumference --      Peak Flow --      Pain Score 03/22/23 0959 5     Pain Loc --      Pain Education --      Exclude from Growth Chart --     Most recent vital signs: Vitals:   03/22/23 1232 03/22/23 1513  BP: (!) 145/76 106/68  Pulse: (!) 107  Resp: 18 16  Temp: 97.7 F (36.5 C) 98.3 F (36.8 C)  SpO2: 98%     Physical Exam Vitals and nursing note reviewed.  Constitutional:      General: Awake and alert. No acute distress.    Appearance: Normal appearance. The patient is normal weight.  HENT:     Head: Normocephalic and atraumatic.     Mouth: Mucous membranes are moist.  Eyes:     General: PERRL. Normal EOMs        Right eye: No discharge.        Left eye: No discharge.     Conjunctiva/sclera: Conjunctivae normal.  Cardiovascular:     Rate and Rhythm: Tachycardic rate.     Pulses: Normal pulses.  Pulmonary:     Effort: Pulmonary effort is normal. No respiratory distress.     Breath sounds: Normal breath sounds.  Abdominal:     Abdomen is  soft. There is no abdominal tenderness. No rebound or guarding. No distention. Musculoskeletal:        General: No swelling. Normal range of motion.     Cervical back: Normal range of motion and neck supple.  Scaly skin bilaterally.  Lateral aspect of right ankle with ulceration and scant drainage, similar to previous visit.  Able to plantarflex and dorsiflex at the ankle.  Sensation intact light touch throughout.  Compartment soft compressible throughout. Skin:    General: Skin is warm and dry.     Capillary Refill: Capillary refill takes less than 2 seconds.     Findings: No rash.  Neurological:     Mental Status: The patient is awake and alert.       ED Results / Procedures / Treatments   Labs (all labs ordered are listed, but only abnormal results are displayed) Labs Reviewed  LACTIC ACID, PLASMA  LACTIC ACID, PLASMA  CBG MONITORING, ED     EKG     RADIOLOGY I independently reviewed and interpreted imaging and agree with radiologists findings.     PROCEDURES:  Critical Care performed:   Procedures   MEDICATIONS ORDERED IN ED: Medications - No data to display   IMPRESSION / MDM / ASSESSMENT AND PLAN / ED COURSE  I reviewed the triage vital signs and the nursing notes.   Differential diagnosis includes, but is not limited to, diabetic ulceration, hardware infection, osteomyelitis.  I reviewed the patient's chart.  Patient has been seen multiple times in this emergency department he was most recently seen for his ankle ulceration on 02/24/2023 at which time photo was sent to Dr. Excell Seltzer with podiatry who felt that this was stable.  Patient was tachycardic to 130 on arrival, though afebrile and normotensive.  Repeat heart rate when he got to the room was 107.  Ulceration appears to be the same size as previous when I saw him last, though there is scant amount of purulent drainage which I did not appreciate before.  Therefore labs and CT scan obtained.  He did have  a CT of his ankle in August that revealed hardware loosening though no acute infection and outpatient follow-up was advised at that time.  Patient was a difficult stick for the RN and IV team was consulted.  X-ray of his ankle did not reveal any acute abnormalities.  CT reveals no changes.  Lactate was able to be obtained and is negative.  No indication for further blood work at this time given negative imaging.  I recommended very close outpatient follow-up with  podiatry, recommended that he call today to make this appointment.  Antibiotics were not recommended by podiatry during previous visits, will not give antibiotics today given no change.  Patient agrees to call podiatry today.  We discussed return precautions and outpatient follow-up.  Patient understands and agrees with plan.  Discharged in stable condition.   Patient's presentation is most consistent with acute presentation with potential threat to life or bodily function.      FINAL CLINICAL IMPRESSION(S) / ED DIAGNOSES   Final diagnoses:  Acute right ankle pain  Chronic wound     Rx / DC Orders   ED Discharge Orders     None        Note:  This document was prepared using Dragon voice recognition software and may include unintentional dictation errors.   Jackelyn Hoehn, PA-C 03/22/23 1516    Sharyn Creamer, MD 03/22/23 [phone]

## 2023-03-22 NOTE — Progress Notes (Signed)
IVT consult placed for access and blood draw. At time of consult, pt with no medication orders or orders for SL. With vessel health and preservation in mind, RN and paramedic messaged- if pt is requiring labs only at this time, please reach out to phlebotomy.  Should pt's POC or condition change and PIV access is needed, please re-consult.

## 2023-03-22 NOTE — Discharge Instructions (Signed)
Please follow-up with your podiatrist as soon as possible.  Call today to make an appointment.  Please return for any new, worsening, or change in symptoms or other concerns.  It was a pleasure caring for you today.

## 2023-03-22 NOTE — ED Triage Notes (Signed)
[facility] to [facility] for C/O right ankle pain.  Fall a few days ago. Patient is weight bearing NAD. VS wnl.

## 2023-03-28 ENCOUNTER — Other Ambulatory Visit: Payer: Self-pay

## 2023-03-28 DIAGNOSIS — L97314 Non-pressure chronic ulcer of right ankle with necrosis of bone: Secondary | ICD-10-CM | POA: Diagnosis not present

## 2023-03-28 DIAGNOSIS — I739 Peripheral vascular disease, unspecified: Secondary | ICD-10-CM | POA: Diagnosis not present

## 2023-03-28 DIAGNOSIS — Z87891 Personal history of nicotine dependence: Secondary | ICD-10-CM | POA: Diagnosis not present

## 2023-03-28 DIAGNOSIS — M86671 Other chronic osteomyelitis, right ankle and foot: Secondary | ICD-10-CM | POA: Diagnosis not present

## 2023-03-28 DIAGNOSIS — B351 Tinea unguium: Secondary | ICD-10-CM | POA: Diagnosis not present

## 2023-03-28 DIAGNOSIS — M25571 Pain in right ankle and joints of right foot: Secondary | ICD-10-CM | POA: Diagnosis not present

## 2023-03-28 DIAGNOSIS — T84498A Other mechanical complication of other internal orthopedic devices, implants and grafts, initial encounter: Secondary | ICD-10-CM | POA: Diagnosis not present

## 2023-03-28 DIAGNOSIS — M19071 Primary osteoarthritis, right ankle and foot: Secondary | ICD-10-CM | POA: Diagnosis not present

## 2023-03-28 DIAGNOSIS — Z91199 Patient's noncompliance with other medical treatment and regimen due to unspecified reason: Secondary | ICD-10-CM | POA: Diagnosis not present

## 2023-03-28 MED ORDER — DOXYCYCLINE HYCLATE 100 MG PO TABS
100.0000 mg | ORAL_TABLET | Freq: Two times a day (BID) | ORAL | 0 refills | Status: DC
  Filled 2023-03-28: qty 14, 7d supply, fill #0

## 2023-03-30 ENCOUNTER — Other Ambulatory Visit (INDEPENDENT_AMBULATORY_CARE_PROVIDER_SITE_OTHER): Payer: Self-pay | Admitting: Podiatry

## 2023-03-30 DIAGNOSIS — I739 Peripheral vascular disease, unspecified: Secondary | ICD-10-CM

## 2023-04-05 ENCOUNTER — Encounter: Payer: Self-pay | Admitting: Emergency Medicine

## 2023-04-05 ENCOUNTER — Emergency Department: Payer: Medicare HMO

## 2023-04-05 ENCOUNTER — Other Ambulatory Visit: Payer: Self-pay

## 2023-04-05 ENCOUNTER — Emergency Department
Admission: EM | Admit: 2023-04-05 | Discharge: 2023-04-06 | Disposition: A | Discharge status: Home / Self Care | Payer: Medicare HMO | Attending: Emergency Medicine | Admitting: Emergency Medicine

## 2023-04-05 DIAGNOSIS — I5022 Chronic systolic (congestive) heart failure: Secondary | ICD-10-CM | POA: Insufficient documentation

## 2023-04-05 DIAGNOSIS — J439 Emphysema, unspecified: Secondary | ICD-10-CM | POA: Diagnosis not present

## 2023-04-05 DIAGNOSIS — R0602 Shortness of breath: Secondary | ICD-10-CM | POA: Diagnosis not present

## 2023-04-05 DIAGNOSIS — N183 Chronic kidney disease, stage 3 unspecified: Secondary | ICD-10-CM | POA: Diagnosis not present

## 2023-04-05 DIAGNOSIS — S0990XA Unspecified injury of head, initial encounter: Secondary | ICD-10-CM | POA: Diagnosis not present

## 2023-04-05 DIAGNOSIS — I4891 Unspecified atrial fibrillation: Secondary | ICD-10-CM | POA: Insufficient documentation

## 2023-04-05 DIAGNOSIS — I6782 Cerebral ischemia: Secondary | ICD-10-CM | POA: Insufficient documentation

## 2023-04-05 DIAGNOSIS — M546 Pain in thoracic spine: Secondary | ICD-10-CM | POA: Diagnosis not present

## 2023-04-05 DIAGNOSIS — I252 Old myocardial infarction: Secondary | ICD-10-CM | POA: Insufficient documentation

## 2023-04-05 DIAGNOSIS — M25511 Pain in right shoulder: Secondary | ICD-10-CM | POA: Diagnosis not present

## 2023-04-05 DIAGNOSIS — M542 Cervicalgia: Secondary | ICD-10-CM | POA: Diagnosis not present

## 2023-04-05 DIAGNOSIS — M47812 Spondylosis without myelopathy or radiculopathy, cervical region: Secondary | ICD-10-CM | POA: Diagnosis not present

## 2023-04-05 DIAGNOSIS — M19011 Primary osteoarthritis, right shoulder: Secondary | ICD-10-CM | POA: Diagnosis not present

## 2023-04-05 DIAGNOSIS — Z5900 Homelessness unspecified: Secondary | ICD-10-CM | POA: Diagnosis not present

## 2023-04-05 DIAGNOSIS — I700 Atherosclerosis of aorta: Secondary | ICD-10-CM | POA: Diagnosis not present

## 2023-04-05 DIAGNOSIS — I10 Essential (primary) hypertension: Secondary | ICD-10-CM | POA: Diagnosis not present

## 2023-04-05 DIAGNOSIS — S199XXA Unspecified injury of neck, initial encounter: Secondary | ICD-10-CM | POA: Diagnosis not present

## 2023-04-05 DIAGNOSIS — R000 Tachycardia, unspecified: Secondary | ICD-10-CM | POA: Diagnosis present

## 2023-04-05 LAB — CBC
HCT: 40.1 % (ref 39.0–52.0)
Hemoglobin: 13.1 g/dL (ref 13.0–17.0)
MCH: 31.8 pg (ref 26.0–34.0)
MCHC: 32.7 g/dL (ref 30.0–36.0)
MCV: 97.3 fL (ref 80.0–100.0)
Platelets: 240 10*3/uL (ref 150–400)
RBC: 4.12 MIL/uL — ABNORMAL LOW (ref 4.22–5.81)
RDW: 16.2 % — ABNORMAL HIGH (ref 11.5–15.5)
WBC: 6.5 10*3/uL (ref 4.0–10.5)
nRBC: 0 % (ref 0.0–0.2)

## 2023-04-05 LAB — BASIC METABOLIC PANEL
Anion gap: 7 (ref 5–15)
BUN: 14 mg/dL (ref 8–23)
CO2: 24 mmol/L (ref 22–32)
Calcium: 9.1 mg/dL (ref 8.9–10.3)
Chloride: 109 mmol/L (ref 98–111)
Creatinine, Ser: 1.2 mg/dL (ref 0.61–1.24)
GFR, Estimated: >60 mL/min (ref >60)
Glucose, Bld: 77 mg/dL (ref 70–99)
Potassium: 3.8 mmol/L (ref 3.5–5.1)
Sodium: 140 mmol/L (ref 135–145)

## 2023-04-05 LAB — ETHANOL: Alcohol, Ethyl (B): <10 mg/dL (ref <10)

## 2023-04-05 LAB — TROPONIN I (HIGH SENSITIVITY): Troponin I (High Sensitivity): 36 ng/L — ABNORMAL HIGH (ref <18)

## 2023-04-05 MED ORDER — DILTIAZEM HCL 25 MG/5ML IV SOLN
15.0000 mg | Freq: Once | INTRAVENOUS | Status: AC
  Administered 2023-04-05: 15 mg via INTRAVENOUS
  Filled 2023-04-05: qty 5

## 2023-04-05 MED ORDER — LACTATED RINGERS IV BOLUS
1000.0000 mL | Freq: Once | INTRAVENOUS | Status: AC
  Administered 2023-04-05: 1000 mL via INTRAVENOUS
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
## 2023-04-06 ENCOUNTER — Other Ambulatory Visit: Payer: Self-pay

## 2023-04-06 DIAGNOSIS — I4891 Unspecified atrial fibrillation: Secondary | ICD-10-CM | POA: Diagnosis not present

## 2023-04-06 LAB — HEPATIC FUNCTION PANEL
ALT: 14 U/L (ref 0–44)
AST: 18 U/L (ref 15–41)
Albumin: 3.7 g/dL (ref 3.5–5.0)
Alkaline Phosphatase: 60 U/L (ref 38–126)
Bilirubin, Direct: <0.1 mg/dL (ref 0.0–0.2)
Total Bilirubin: 0.6 mg/dL (ref 0.3–1.2)
Total Protein: 6.7 g/dL (ref 6.5–8.1)

## 2023-04-06 LAB — TROPONIN I (HIGH SENSITIVITY): Troponin I (High Sensitivity): 35 ng/L — ABNORMAL HIGH (ref <18)

## 2023-04-06 LAB — MAGNESIUM: Magnesium: 2.1 mg/dL (ref 1.7–2.4)

## 2023-04-06 NOTE — ED Notes (Signed)
Patient given sandwich tray and beverage.  Patient is now acting appropriate and showing no signs of distress.

## 2023-04-06 NOTE — ED Notes (Signed)
Patient very upset about IV stick.  He was a very difficult, as he kept pulling arm away and refusing to allow "that long needle".  Patient was insulting to staff in the room.
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
## 2023-04-06 NOTE — ED Notes (Signed)
Patient up and ambulating in his room to use the toilet.  No difficulty noted.

## 2023-04-06 NOTE — Discharge Instructions (Addendum)
Your CT scans, x-ray showed no fractures or traumatic injury.  Your lab work today was reassuring.  We recommend that you start taking all of your prescribed medications to keep your heart rate controlled and to prevent stroke.

## 2023-04-19 ENCOUNTER — Ambulatory Visit: Payer: Medicare HMO | Admitting: Podiatry

## 2023-04-28 ENCOUNTER — Emergency Department: Payer: Medicare HMO

## 2023-04-28 ENCOUNTER — Other Ambulatory Visit: Payer: Self-pay

## 2023-04-28 ENCOUNTER — Encounter: Payer: Self-pay | Admitting: Emergency Medicine

## 2023-04-28 DIAGNOSIS — W1830XA Fall on same level, unspecified, initial encounter: Secondary | ICD-10-CM | POA: Diagnosis not present

## 2023-04-28 DIAGNOSIS — I10 Essential (primary) hypertension: Secondary | ICD-10-CM | POA: Insufficient documentation

## 2023-04-28 DIAGNOSIS — Z7901 Long term (current) use of anticoagulants: Secondary | ICD-10-CM | POA: Insufficient documentation

## 2023-04-28 DIAGNOSIS — I2510 Atherosclerotic heart disease of native coronary artery without angina pectoris: Secondary | ICD-10-CM | POA: Insufficient documentation

## 2023-04-28 DIAGNOSIS — R000 Tachycardia, unspecified: Secondary | ICD-10-CM | POA: Diagnosis not present

## 2023-04-28 DIAGNOSIS — Z5900 Homelessness unspecified: Secondary | ICD-10-CM | POA: Diagnosis not present

## 2023-04-28 DIAGNOSIS — I4891 Unspecified atrial fibrillation: Secondary | ICD-10-CM | POA: Insufficient documentation

## 2023-04-28 DIAGNOSIS — I517 Cardiomegaly: Secondary | ICD-10-CM | POA: Diagnosis not present

## 2023-04-28 DIAGNOSIS — R42 Dizziness and giddiness: Secondary | ICD-10-CM | POA: Diagnosis not present

## 2023-04-28 DIAGNOSIS — R600 Localized edema: Secondary | ICD-10-CM | POA: Diagnosis not present

## 2023-04-28 DIAGNOSIS — R55 Syncope and collapse: Secondary | ICD-10-CM | POA: Diagnosis not present

## 2023-04-28 DIAGNOSIS — E119 Type 2 diabetes mellitus without complications: Secondary | ICD-10-CM | POA: Diagnosis not present

## 2023-04-28 DIAGNOSIS — F330 Major depressive disorder, recurrent, mild: Secondary | ICD-10-CM | POA: Insufficient documentation

## 2023-04-28 DIAGNOSIS — I6782 Cerebral ischemia: Secondary | ICD-10-CM | POA: Diagnosis not present

## 2023-04-28 LAB — BASIC METABOLIC PANEL
Anion gap: 6 (ref 5–15)
BUN: 15 mg/dL (ref 8–23)
CO2: 26 mmol/L (ref 22–32)
Calcium: 9 mg/dL (ref 8.9–10.3)
Chloride: 109 mmol/L (ref 98–111)
Creatinine, Ser: 1.3 mg/dL — ABNORMAL HIGH (ref 0.61–1.24)
GFR, Estimated: >60 mL/min (ref >60)
Glucose, Bld: 105 mg/dL — ABNORMAL HIGH (ref 70–99)
Potassium: 4.3 mmol/L (ref 3.5–5.1)
Sodium: 141 mmol/L (ref 135–145)

## 2023-04-28 LAB — CBC
HCT: 43.4 % (ref 39.0–52.0)
Hemoglobin: 14.1 g/dL (ref 13.0–17.0)
MCH: 31.8 pg (ref 26.0–34.0)
MCHC: 32.5 g/dL (ref 30.0–36.0)
MCV: 97.7 fL (ref 80.0–100.0)
Platelets: 245 10*3/uL (ref 150–400)
RBC: 4.44 MIL/uL (ref 4.22–5.81)
RDW: 16.5 % — ABNORMAL HIGH (ref 11.5–15.5)
WBC: 6.6 10*3/uL (ref 4.0–10.5)
nRBC: 0 % (ref 0.0–0.2)

## 2023-04-28 LAB — TROPONIN I (HIGH SENSITIVITY): Troponin I (High Sensitivity): 48 ng/L — ABNORMAL HIGH (ref <18)
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
## 2023-04-28 NOTE — ED Triage Notes (Signed)
Patient to triage with complaints of dizziness all day, patient endorses falling earlier today after stepping in a hole. Denies LOC or hitting head.

## 2023-04-29 ENCOUNTER — Emergency Department
Admission: EM | Admit: 2023-04-29 | Discharge: 2023-04-29 | Disposition: A | Discharge status: Home / Self Care | Payer: Medicare HMO | Source: Home / Self Care | Attending: Emergency Medicine | Admitting: Emergency Medicine

## 2023-04-29 ENCOUNTER — Emergency Department
Admission: EM | Admit: 2023-04-29 | Discharge: 2023-04-29 | Disposition: A | Discharge status: Home / Self Care | Payer: Medicare HMO | Attending: Emergency Medicine | Admitting: Emergency Medicine

## 2023-04-29 ENCOUNTER — Other Ambulatory Visit: Payer: Self-pay

## 2023-04-29 ENCOUNTER — Emergency Department: Payer: Medicare HMO

## 2023-04-29 DIAGNOSIS — I2510 Atherosclerotic heart disease of native coronary artery without angina pectoris: Secondary | ICD-10-CM | POA: Insufficient documentation

## 2023-04-29 DIAGNOSIS — I4891 Unspecified atrial fibrillation: Secondary | ICD-10-CM | POA: Diagnosis not present

## 2023-04-29 DIAGNOSIS — W19XXXA Unspecified fall, initial encounter: Secondary | ICD-10-CM

## 2023-04-29 DIAGNOSIS — R600 Localized edema: Secondary | ICD-10-CM | POA: Insufficient documentation

## 2023-04-29 DIAGNOSIS — I6782 Cerebral ischemia: Secondary | ICD-10-CM | POA: Diagnosis not present

## 2023-04-29 DIAGNOSIS — E119 Type 2 diabetes mellitus without complications: Secondary | ICD-10-CM | POA: Insufficient documentation

## 2023-04-29 DIAGNOSIS — I10 Essential (primary) hypertension: Secondary | ICD-10-CM | POA: Insufficient documentation

## 2023-04-29 DIAGNOSIS — F330 Major depressive disorder, recurrent, mild: Secondary | ICD-10-CM | POA: Insufficient documentation

## 2023-04-29 MED ORDER — RIVAROXABAN 20 MG PO TABS
20.0000 mg | ORAL_TABLET | Freq: Every day | ORAL | 1 refills | Status: DC
  Filled 2023-04-29: qty 90, 90d supply, fill #0
  Filled 2023-06-01: qty 30, 30d supply, fill #1
  Filled 2023-06-01: qty 90, 90d supply, fill #1

## 2023-04-29 MED ORDER — METOPROLOL SUCCINATE ER 25 MG PO TB24
25.0000 mg | ORAL_TABLET | Freq: Every day | ORAL | 1 refills | Status: DC
  Filled 2023-04-29: qty 90, 90d supply, fill #0
  Filled 2023-06-01: qty 30, 30d supply, fill #1

## 2023-04-29 MED ORDER — SODIUM CHLORIDE 0.9 % IV BOLUS: 1000.0000 mL | Freq: Once | INTRAVENOUS | Status: DC

## 2023-04-29 MED ORDER — RIVAROXABAN 20 MG PO TABS
20.0000 mg | ORAL_TABLET | Freq: Once | ORAL | Status: AC
  Administered 2023-04-29: 20 mg via ORAL
  Filled 2023-04-29: qty 1

## 2023-04-29 MED ORDER — METOPROLOL SUCCINATE ER 50 MG PO TB24: 25.0000 mg | ORAL_TABLET | Freq: Every day | ORAL | Status: DC

## 2023-04-29 MED ORDER — LOSARTAN POTASSIUM 50 MG PO TABS
50.0000 mg | ORAL_TABLET | Freq: Every day | ORAL | 1 refills | Status: DC
  Filled 2023-04-29: qty 90, 90d supply, fill #0
  Filled 2023-06-01: qty 30, 30d supply, fill #1

## 2023-04-29 NOTE — Discharge Instructions (Addendum)
Take all medications as prescribed and you are welcome to return to the emergency department if you change your mind about further evaluation and treatment.

## 2023-04-29 NOTE — ED Notes (Signed)
Placed in Triage / Flex waiting area.  Diet ordered.  Patient updated on plan of care and understanding verbalized.

## 2023-04-29 NOTE — ED Notes (Signed)
While obtaining VS, HR noted to be 130s, as seen earlier this am. Pt continues to states he "does not need to be stuck, I am fine". Refusing lab work, refusing to be seen regarding a fib.

## 2023-04-29 NOTE — ED Notes (Signed)
Pt continues to fall asleep after discharge and not wanting to leave treatment room.

## 2023-04-29 NOTE — ED Notes (Signed)
Pt refusing to leave room even after discharge.  Pt getting verbally agitated and hitting railing of bed when talking to this nurse.  Security notified and will respond.

## 2023-04-29 NOTE — ED Notes (Signed)
Pt continuously calling out demanding staff around.  Patient demanding food, blankets, and urinals.  Upon entering room to inform patient of plan of care patient states "hell no you're not" and refused an IV and treatment that comes with an IV.  Pt informed that is his right to refuse but what would he like for [place] to do for him if he does not want the treatment offered?  Pt then cut this RN off and demanded a urinal and said "quit playing games and asking those questions like a joke."  Pt informed that this RN is not joking and would let the MD know.  Pt continued to refuse to MD.

## 2023-04-29 NOTE — ED Notes (Signed)
Pt escorted out by security

## 2023-04-29 NOTE — ED Provider Notes (Signed)
   [facility] Provider Note    Event Date/Time   First MD Initiated Contact with Patient 04/29/23 0830     (approximate)   History   Depression   HPI  Jeremy [name] is a 67 y.o. male with significant past medical history of CAD, diabetes, hypertension, atrial fibrillation, substance abuse just discharged from the department 2 hours stating that he is depressed because of the blood draws that he had while in the emergency department.  No SI or HI.  Also reports that he is having difficulties with his girlfriend     Physical Exam   Triage Vital Signs: ED Triage Vitals  Encounter Vitals Group     BP 04/29/23 0816 (!) 142/110     Systolic BP Percentile --      Diastolic BP Percentile --      Pulse Rate 04/29/23 0816 (!) 130     Resp 04/29/23 0818 18     Temp 04/29/23 0816 98.1 F (36.7 C)     Temp src --      SpO2 04/29/23 0816 99 %     Weight 04/29/23 0815 99.8 kg (220 lb)     Height 04/29/23 0815 1.803 m (5\' 11" )     Head Circumference --      Peak Flow --      Pain Score 04/29/23 0815 4     Pain Loc --      Pain Education --      Exclude from Growth Chart --     Most recent vital signs: Vitals:   04/29/23 0818 04/29/23 0848  BP:    Pulse:  (!) 108  Resp: 18   Temp:    SpO2:       General: Awake, no distress.  CV:  Good peripheral perfusion.  Irregular heart rate, heart rate 108 Resp:  Normal effort.  Clear to auscultation bilaterally Abd:  No distention.  Other:  Mild lower extremity edema bilaterally   ED Results / Procedures / Treatments   Labs (all labs ordered are listed, but only abnormal results are displayed) Labs Reviewed - No data to display   EKG     RADIOLOGY     PROCEDURES:  Critical Care performed:   Procedures   MEDICATIONS ORDERED IN ED: Medications - No data to display   IMPRESSION / MDM / ASSESSMENT AND PLAN / ED COURSE  I reviewed the triage vital signs and the nursing  notes. Patient's presentation is most consistent with exacerbation of chronic illness.  Patient states that he is depressed related to the venipuncture but denies SI or HI.  Also reports that he is hungry for breakfast.  Reviewed notes from last night, had significant workup and patient declined further treatment/admission.  He continues to decline this now  Will provide breakfast, refer to [facility] urgent care for chronic conditions        FINAL CLINICAL IMPRESSION(S) / ED DIAGNOSES   Final diagnoses:  Mild episode of recurrent major depressive disorder (HCC)     Rx / DC Orders   ED Discharge Orders     None        Note:  This document was prepared using Dragon voice recognition software and may include unintentional dictation errors.   Jene Every, MD 04/29/23 [phone]
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
## 2023-04-29 NOTE — ED Notes (Signed)
Breakfast tray given to patient.
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
## 2023-04-29 NOTE — ED Notes (Signed)
100% breakfast tray eaten.  A second cup of coffee given to patient to take with him.  Instructed to follow up with [facility].

## 2023-05-09 ENCOUNTER — Other Ambulatory Visit: Payer: Self-pay

## 2023-06-01 ENCOUNTER — Other Ambulatory Visit: Payer: Self-pay

## 2023-06-03 ENCOUNTER — Other Ambulatory Visit: Payer: Self-pay

## 2023-06-03 DIAGNOSIS — E119 Type 2 diabetes mellitus without complications: Secondary | ICD-10-CM | POA: Diagnosis not present

## 2023-06-03 DIAGNOSIS — Z1331 Encounter for screening for depression: Secondary | ICD-10-CM | POA: Diagnosis not present

## 2023-06-03 DIAGNOSIS — Z2821 Immunization not carried out because of patient refusal: Secondary | ICD-10-CM | POA: Diagnosis not present

## 2023-06-03 DIAGNOSIS — I4891 Unspecified atrial fibrillation: Secondary | ICD-10-CM | POA: Diagnosis not present

## 2023-06-03 DIAGNOSIS — F17210 Nicotine dependence, cigarettes, uncomplicated: Secondary | ICD-10-CM | POA: Diagnosis not present

## 2023-06-03 DIAGNOSIS — F339 Major depressive disorder, recurrent, unspecified: Secondary | ICD-10-CM | POA: Diagnosis not present

## 2023-06-03 DIAGNOSIS — Z951 Presence of aortocoronary bypass graft: Secondary | ICD-10-CM | POA: Diagnosis not present

## 2023-06-03 DIAGNOSIS — M25471 Effusion, right ankle: Secondary | ICD-10-CM | POA: Diagnosis not present

## 2023-06-03 DIAGNOSIS — Z1339 Encounter for screening examination for other mental health and behavioral disorders: Secondary | ICD-10-CM | POA: Diagnosis not present

## 2023-06-03 MED ORDER — TRAMADOL HCL 50 MG PO TABS
50.0000 mg | ORAL_TABLET | Freq: Every day | ORAL | 1 refills | Status: DC | PRN
  Filled 2023-06-03: qty 30, 30d supply, fill #0

## 2023-06-03 MED ORDER — TADALAFIL 20 MG PO TABS
20.0000 mg | ORAL_TABLET | Freq: Every day | ORAL | 2 refills | Status: DC | PRN
  Filled 2023-06-03: qty 6, 30d supply, fill #0

## 2023-06-09 ENCOUNTER — Other Ambulatory Visit: Payer: Self-pay

## 2023-06-09 DIAGNOSIS — I214 Non-ST elevation (NSTEMI) myocardial infarction: Secondary | ICD-10-CM | POA: Diagnosis not present

## 2023-06-09 DIAGNOSIS — Z951 Presence of aortocoronary bypass graft: Secondary | ICD-10-CM | POA: Diagnosis not present

## 2023-06-09 DIAGNOSIS — N1831 Chronic kidney disease, stage 3a: Secondary | ICD-10-CM | POA: Diagnosis not present

## 2023-06-09 DIAGNOSIS — Z720 Tobacco use: Secondary | ICD-10-CM | POA: Diagnosis not present

## 2023-06-09 DIAGNOSIS — E6609 Other obesity due to excess calories: Secondary | ICD-10-CM | POA: Diagnosis not present

## 2023-06-09 DIAGNOSIS — F1991 Other psychoactive substance use, unspecified, in remission: Secondary | ICD-10-CM | POA: Diagnosis not present

## 2023-06-09 DIAGNOSIS — I10 Essential (primary) hypertension: Secondary | ICD-10-CM | POA: Diagnosis not present

## 2023-06-09 DIAGNOSIS — I4891 Unspecified atrial fibrillation: Secondary | ICD-10-CM | POA: Diagnosis not present

## 2023-06-09 DIAGNOSIS — E66811 Obesity, class 1: Secondary | ICD-10-CM | POA: Diagnosis not present

## 2023-06-09 MED ORDER — SACUBITRIL-VALSARTAN 24-26 MG PO TABS
1.0000 | ORAL_TABLET | Freq: Two times a day (BID) | ORAL | 3 refills | Status: DC
  Filled 2023-06-09: qty 150, 75d supply, fill #0

## 2023-06-09 MED ORDER — SPIRONOLACTONE 25 MG PO TABS
12.5000 mg | ORAL_TABLET | Freq: Every day | ORAL | 3 refills | Status: DC
  Filled 2023-06-09: qty 45, 90d supply, fill #0

## 2023-06-09 MED ORDER — RIVAROXABAN 20 MG PO TABS
20.0000 mg | ORAL_TABLET | Freq: Every day | ORAL | 3 refills | Status: DC
  Filled 2023-06-09: qty 90, 90d supply, fill #0

## 2023-06-09 MED ORDER — CARVEDILOL 6.25 MG PO TABS
6.2500 mg | ORAL_TABLET | Freq: Two times a day (BID) | ORAL | 3 refills | Status: DC
  Filled 2023-06-09: qty 180, 90d supply, fill #0

## 2023-06-09 MED ORDER — AMIODARONE HCL 200 MG PO TABS
200.0000 mg | ORAL_TABLET | Freq: Every day | ORAL | 3 refills | Status: DC
  Filled 2023-06-09: qty 90, 90d supply, fill #0

## 2023-06-09 MED ORDER — FUROSEMIDE 20 MG PO TABS
20.0000 mg | ORAL_TABLET | Freq: Every day | ORAL | 3 refills | Status: DC
  Filled 2023-06-09: qty 90, 90d supply, fill #0

## 2023-06-17 ENCOUNTER — Other Ambulatory Visit: Payer: Self-pay

## 2023-06-17 MED ORDER — CEPHALEXIN 500 MG PO CAPS
500.0000 mg | ORAL_CAPSULE | Freq: Three times a day (TID) | ORAL | 0 refills | Status: DC
  Filled 2023-06-17: qty 21, 7d supply, fill #0

## 2023-06-17 MED ORDER — TRAMADOL HCL 50 MG PO TABS: 50.0000 mg | ORAL_TABLET | Freq: Every day | ORAL | 0 refills | Status: DC | PRN

## 2023-06-17 MED ORDER — TORSEMIDE 20 MG PO TABS
20.0000 mg | ORAL_TABLET | Freq: Every day | ORAL | 3 refills | Status: DC
  Filled 2023-06-17: qty 90, 90d supply, fill #0

## 2023-06-17 MED ORDER — TADALAFIL 20 MG PO TABS
20.0000 mg | ORAL_TABLET | Freq: Every day | ORAL | 3 refills | Status: DC | PRN
  Filled 2023-06-17: qty 6, 30d supply, fill #0

## 2023-06-22 ENCOUNTER — Other Ambulatory Visit: Payer: Self-pay | Admitting: Internal Medicine

## 2023-06-22 DIAGNOSIS — I82409 Acute embolism and thrombosis of unspecified deep veins of unspecified lower extremity: Secondary | ICD-10-CM

## 2023-06-25 NOTE — Progress Notes (Signed)
 Established Patient Visit   Chief Complaint: Chief Complaint  Patient presents with  . Follow-up   Date of Service: 06/17/2023 Date of Birth: 08/06/1955 PCP: Sadie Tamra Cal, MD  History of Present Illness: Mr. Jeremy [name] is a 68 y.o.male patient who patient has history of multiple medical problems coronary disease coronary bypass surgery cardiomyopathy chronic systolic congestive heart failure renal insufficiency previous non-STEMI diabetes smoking obesity noncompliance lower extremity edema with hypertension patient complains of right ankle pain and swelling of his leg he states he may have hurt his leg and injured it have several days ago now he is not sure whether there is a blood clot in his leg denies any recent chest pain just lower extremity edema still smoking no block as well as a syncope has had palpitations and tachycardia has not been compliant with all his medications here for refills  Past Medical and Surgical History  Past Medical History Past Medical History:  Diagnosis Date  . Diabetes mellitus without complication (CMS/HHS-HCC)   . Hypertension     Past Surgical History He has a past surgical history that includes Atrial Septectomy Open Heart W/Cpb and Fracture surgery (Right).   Medications and Allergies  Current Medications  Current Outpatient Medications  Medication Sig Dispense Refill  . AMIOdarone  (PACERONE ) 200 MG tablet Take 1 tablet (200 mg total) by mouth once daily 90 tablet 3  . carvediloL  (COREG ) 6.25 MG tablet Take 1 tablet (6.25 mg total) by mouth 2 (two) times daily with meals 180 tablet 3  . FUROsemide  (LASIX ) 20 MG tablet Take 1 tablet (20 mg total) by mouth once daily 90 tablet 3  . metoprolol  SUCCinate (TOPROL -XL) 25 MG XL tablet Take 25 mg by mouth once daily    . sacubitriL -valsartan  (ENTRESTO ) 24-26 mg tablet Take 1 tablet by mouth every 12 (twelve) hours for 360 days 180 tablet 3  . spironolactone  (ALDACTONE ) 25 MG tablet Take 0.5  tablets (12.5 mg total) by mouth once daily 45 tablet 3  . tadalafiL  (CIALIS ) 20 MG tablet Take 1 tablet (20 mg total) by mouth once daily as needed for Erectile Dysfunction for up to 120 days 30 tablet 3  . traMADoL  (ULTRAM ) 50 mg tablet Take 1 tablet (50 mg total) by mouth once daily as needed for Pain for up to 30 days 30 tablet 0  . albuterol  (PROVENTIL ) 2.5 mg /3 mL (0.083 %) nebulizer solution Take 3 mLs (2.5 mg total) by nebulization every 6 (six) hours as needed for Wheezing (Patient not taking: Reported on 06/17/2023) 75 mL 5  . albuterol  90 mcg/actuation inhaler Inhale 2 inhalations into the lungs every 6 (six) hours as needed (Patient not taking: Reported on 06/17/2023) 1 each 1  . aspirin  81 MG chewable tablet Take 81 mg by mouth once daily (Patient not taking: Reported on 06/17/2023)    . atorvastatin  (LIPITOR) 20 MG tablet Take 1 tablet (20 mg total) by mouth once daily (Patient not taking: Reported on 06/17/2023) 90 tablet 1  . blood glucose diagnostic test strip 1 each (1 strip total) once daily Use as instructed. (Patient not taking: Reported on 06/17/2023) 100 each 3  . blood glucose meter kit once daily (Patient not taking: Reported on 06/17/2023) 1 each 0  . clotrimazole-betamethasone (LOTRISONE) 1-0.05 % cream Apply topically 2 (two) times daily (Patient not taking: Reported on 06/17/2023) 45 g 1  . iodoform 1/4 X 5 -yard Bndg Apply 1 Canister topically once daily (Patient not taking: Reported on 06/17/2023) 1 each  3  . lancing device with lancets kit Use 1 each once daily (Patient not taking: Reported on 06/17/2023) 100 each 3  . losartan  (COZAAR ) 50 MG tablet Take 1 tablet (50 mg total) by mouth once daily for 180 days 90 tablet 1  . mupirocin  (BACTROBAN ) 2 % ointment Apply topically once daily (Patient not taking: Reported on 06/17/2023) 22 g 0  . rivaroxaban  (XARELTO ) 20 mg tablet Take 1 tablet (20 mg total) by mouth once daily for 180 days 90 tablet 1  . rivaroxaban  (XARELTO ) 20 mg tablet Take  1 tablet (20 mg total) by mouth once daily (Patient not taking: Reported on 06/17/2023) 90 tablet 3  . TORsemide  (DEMADEX ) 20 MG tablet Take 1 tablet (20 mg total) by mouth once daily 90 tablet 3   No current facility-administered medications for this visit.    Allergies: Cephalexin   Social and Family History  Social History  reports that he has been smoking cigarettes. He has never used smokeless tobacco. He reports current alcohol  use. He reports that he does not currently use drugs after having used the following drugs: Marijuana.  Family History No family history on file.  Review of Systems   Review of Systems: The patient denies chest pain, shortness of breath, orthopnea, paroxysmal nocturnal dyspnea, pedal edema, palpitations, heart racing, presyncope, syncope. Review of 12 Systems is negative except as described above.  Physical Examination   Vitals:BP 120/84   Pulse (!) 120   Resp 16   Ht 180.3 cm (5' 11)   Wt (!) 105.7 kg (233 lb)   SpO2 97%   BMI 32.50 kg/m  Ht:180.3 cm (5' 11) Wt:(!) 105.7 kg (233 lb) ADJ:Anib surface area is 2.3 meters squared. Body mass index is 32.5 kg/m.  HEENT: Pupils equally reactive to light and accomodation  Neck: Supple without thyromegaly, carotid pulses 2+ Lungs: clear to auscultation bilaterally; no wheezes, rales, rhonchi Heart: Tachycardic rate and rhythm.  No gallops, murmurs or rub Abdomen: soft nontender, nondistended, with normal bowel sounds Extremities: no cyanosis, clubbing, or edema Peripheral Pulses: 2+ in all extremities, 2+ femoral pulses bilaterally Neurologic: Alert and oriented X3; speech intact; face symmetrical; moves all extremities well  Assessment   68 y.o. male with  1. Deep vein thrombosis (DVT) of lower extremity, unspecified chronicity, unspecified laterality, unspecified vein (CMS/HHS-HCC)   2. Right ankle pain, unspecified chronicity   3. Atrial fibrillation with RVR (CMS/HHS-HCC)   4. Essential  hypertension   5. NSTEMI (non-ST elevated myocardial infarction) (CMS/HHS-HCC)   6. S/P CABG (coronary artery bypass graft)   7. Tobacco use   8. Stage 3a chronic kidney disease (CMS/HHS-HCC)   9. Class 1 obesity due to excess calories with body mass index (BMI) of 32.0 to 32.9 in adult, unspecified whether serious comorbidity present   10. Medical non-compliance   11. BMI 34.0-34.9,adult   12. Controlled type 2 diabetes mellitus without complication, unspecified whether long term insulin  use (CMS/HHS-HCC)        Plan  Edema lower extremity unclear etiology would recommend diuretic therapy switch from Lasix  to torsemide  support stockings elevation Referred for ultrasound of the right leg to rule out DVT because of swelling and discomfort Right ankle x-ray because of pain and recent injury Prescribe limited dose of tramadol  to help with pain control Possible lower extremity cellulitis will prescribe Keflex  for 1 week have the patient follow-up with primary physician Coronary artery disease by history including coronary bypass surgery continue current management continue beta-blocker aspirin  statin  Atrial fibrillation paroxysmal on amiodarone  Xarelto  beta-blocker for rate Chronic systolic congestive heart failure prescribed beta-blocker Entresto  spironolactone  diuretics will have patient follow-up with heart failure clinic Ischemic cardiomyopathy EF around 35% continue heart failure therapy follow-up with heart failure clinic Tachycardia probably related to atrial fibrillation we will resume medications amiodarone  beta-blocker to help with rate and management maintain Xarelto  Have the patient follow-up in 1 month     Return in about 1 month (around 07/18/2023).  DWAYNE D CALLWOOD, MD  This dictation was prepared with dragon dictation.  Any transcription errors that result from this process are unintentional.

## 2023-06-26 ENCOUNTER — Emergency Department: Payer: 59

## 2023-06-26 ENCOUNTER — Observation Stay
Admission: EM | Admit: 2023-06-26 | Discharge: 2023-06-28 | Disposition: A | Discharge status: Home / Self Care | Payer: 59 | Attending: Osteopathic Medicine | Admitting: Osteopathic Medicine

## 2023-06-26 ENCOUNTER — Observation Stay: Payer: 59

## 2023-06-26 DIAGNOSIS — Z951 Presence of aortocoronary bypass graft: Secondary | ICD-10-CM | POA: Insufficient documentation

## 2023-06-26 DIAGNOSIS — J441 Chronic obstructive pulmonary disease with (acute) exacerbation: Principal | ICD-10-CM | POA: Insufficient documentation

## 2023-06-26 DIAGNOSIS — R000 Tachycardia, unspecified: Secondary | ICD-10-CM | POA: Diagnosis not present

## 2023-06-26 DIAGNOSIS — I130 Hypertensive heart and chronic kidney disease with heart failure and stage 1 through stage 4 chronic kidney disease, or unspecified chronic kidney disease: Secondary | ICD-10-CM | POA: Diagnosis not present

## 2023-06-26 DIAGNOSIS — I2510 Atherosclerotic heart disease of native coronary artery without angina pectoris: Secondary | ICD-10-CM | POA: Diagnosis not present

## 2023-06-26 DIAGNOSIS — Z91198 Patient's noncompliance with other medical treatment and regimen for other reason: Secondary | ICD-10-CM | POA: Diagnosis not present

## 2023-06-26 DIAGNOSIS — N183 Chronic kidney disease, stage 3 unspecified: Secondary | ICD-10-CM | POA: Insufficient documentation

## 2023-06-26 DIAGNOSIS — I2693 Single subsegmental pulmonary embolism without acute cor pulmonale: Secondary | ICD-10-CM | POA: Insufficient documentation

## 2023-06-26 DIAGNOSIS — Z1152 Encounter for screening for COVID-19: Secondary | ICD-10-CM | POA: Diagnosis not present

## 2023-06-26 DIAGNOSIS — R051 Acute cough: Secondary | ICD-10-CM

## 2023-06-26 DIAGNOSIS — Z7982 Long term (current) use of aspirin: Secondary | ICD-10-CM | POA: Diagnosis not present

## 2023-06-26 DIAGNOSIS — I2699 Other pulmonary embolism without acute cor pulmonale: Secondary | ICD-10-CM | POA: Diagnosis present

## 2023-06-26 DIAGNOSIS — I480 Paroxysmal atrial fibrillation: Secondary | ICD-10-CM | POA: Diagnosis not present

## 2023-06-26 DIAGNOSIS — I5032 Chronic diastolic (congestive) heart failure: Secondary | ICD-10-CM | POA: Diagnosis not present

## 2023-06-26 DIAGNOSIS — R0789 Other chest pain: Secondary | ICD-10-CM

## 2023-06-26 DIAGNOSIS — I4891 Unspecified atrial fibrillation: Secondary | ICD-10-CM | POA: Diagnosis present

## 2023-06-26 DIAGNOSIS — F17210 Nicotine dependence, cigarettes, uncomplicated: Secondary | ICD-10-CM | POA: Insufficient documentation

## 2023-06-26 DIAGNOSIS — Z7984 Long term (current) use of oral hypoglycemic drugs: Secondary | ICD-10-CM | POA: Diagnosis not present

## 2023-06-26 DIAGNOSIS — E1122 Type 2 diabetes mellitus with diabetic chronic kidney disease: Secondary | ICD-10-CM | POA: Diagnosis not present

## 2023-06-26 DIAGNOSIS — Z79899 Other long term (current) drug therapy: Secondary | ICD-10-CM | POA: Insufficient documentation

## 2023-06-26 DIAGNOSIS — Z7901 Long term (current) use of anticoagulants: Secondary | ICD-10-CM | POA: Diagnosis not present

## 2023-06-26 DIAGNOSIS — R059 Cough, unspecified: Secondary | ICD-10-CM | POA: Diagnosis present

## 2023-06-26 DIAGNOSIS — R300 Dysuria: Secondary | ICD-10-CM | POA: Insufficient documentation

## 2023-06-26 LAB — CBC WITH DIFFERENTIAL/PLATELET
Abs Immature Granulocytes: 0.02 10*3/uL (ref 0.00–0.07)
Basophils Absolute: 0.1 10*3/uL (ref 0.0–0.1)
Basophils Relative: 1 %
Eosinophils Absolute: 0.2 10*3/uL (ref 0.0–0.5)
Eosinophils Relative: 3 %
HCT: 45.4 % (ref 39.0–52.0)
Hemoglobin: 15.1 g/dL (ref 13.0–17.0)
Immature Granulocytes: 0 %
Lymphocytes Relative: 24 %
Lymphs Abs: 1.7 10*3/uL (ref 0.7–4.0)
MCH: 31.7 pg (ref 26.0–34.0)
MCHC: 33.3 g/dL (ref 30.0–36.0)
MCV: 95.2 fL (ref 80.0–100.0)
Monocytes Absolute: 0.8 10*3/uL (ref 0.1–1.0)
Monocytes Relative: 10 %
Neutro Abs: 4.5 10*3/uL (ref 1.7–7.7)
Neutrophils Relative %: 62 %
Platelets: 232 10*3/uL (ref 150–400)
RBC: 4.77 MIL/uL (ref 4.22–5.81)
RDW: 14.4 % (ref 11.5–15.5)
WBC: 7.3 10*3/uL (ref 4.0–10.5)
nRBC: 0 % (ref 0.0–0.2)

## 2023-06-26 LAB — URINALYSIS, COMPLETE (UACMP) WITH MICROSCOPIC
Bacteria, UA: NONE SEEN
Bilirubin Urine: NEGATIVE
Glucose, UA: NEGATIVE mg/dL
Hgb urine dipstick: NEGATIVE
Ketones, ur: NEGATIVE mg/dL
Leukocytes,Ua: NEGATIVE
Nitrite: NEGATIVE
Protein, ur: NEGATIVE mg/dL
Specific Gravity, Urine: 1.023 (ref 1.005–1.030)
Squamous Epithelial / HPF: 0 /[HPF] (ref 0–5)
pH: 5 (ref 5.0–8.0)

## 2023-06-26 LAB — BASIC METABOLIC PANEL WITH GFR
Anion gap: 8 (ref 5–15)
BUN: 18 mg/dL (ref 8–23)
CO2: 18 mmol/L — ABNORMAL LOW (ref 22–32)
Calcium: 8.8 mg/dL — ABNORMAL LOW (ref 8.9–10.3)
Chloride: 108 mmol/L (ref 98–111)
Creatinine, Ser: 1.14 mg/dL (ref 0.61–1.24)
GFR, Estimated: >60 mL/min
Glucose, Bld: 142 mg/dL — ABNORMAL HIGH (ref 70–99)
Potassium: 4.1 mmol/L (ref 3.5–5.1)
Sodium: 134 mmol/L — ABNORMAL LOW (ref 135–145)

## 2023-06-26 LAB — CBG MONITORING, ED
Glucose-Capillary: 83 mg/dL (ref 70–99)
Glucose-Capillary: 271 mg/dL — ABNORMAL HIGH (ref 70–99)

## 2023-06-26 LAB — TROPONIN I (HIGH SENSITIVITY)
Troponin I (High Sensitivity): 15 ng/L (ref <18)
Troponin I (High Sensitivity): 18 ng/L — ABNORMAL HIGH (ref <18)

## 2023-06-26 LAB — PROTIME-INR
INR: 1 (ref 0.8–1.2)
Prothrombin Time: 13.3 s (ref 11.4–15.2)

## 2023-06-26 LAB — RESP PANEL BY RT-PCR (RSV, FLU A&B, COVID)  RVPGX2
Influenza A by PCR: NEGATIVE
Influenza B by PCR: NEGATIVE
Resp Syncytial Virus by PCR: NEGATIVE
SARS Coronavirus 2 by RT PCR: NEGATIVE

## 2023-06-26 LAB — APTT: aPTT: 32 s (ref 24–36)

## 2023-06-26 LAB — HEMOGLOBIN A1C
Hgb A1c MFr Bld: 6.4 % — ABNORMAL HIGH (ref 4.8–5.6)
Mean Plasma Glucose: 136.98 mg/dL

## 2023-06-26 MED ORDER — METHYLPREDNISOLONE SODIUM SUCC 125 MG IJ SOLR
125.0000 mg | Freq: Once | INTRAMUSCULAR | Status: AC
  Administered 2023-06-26: 125 mg via INTRAVENOUS
  Filled 2023-06-26: qty 2

## 2023-06-26 MED ORDER — LINACLOTIDE 72 MCG PO CAPS: 72.0000 ug | ORAL_CAPSULE | Freq: Every day | ORAL | Status: DC | PRN

## 2023-06-26 MED ORDER — METOPROLOL TARTRATE 5 MG/5ML IV SOLN
5.0000 mg | INTRAVENOUS | Status: DC | PRN
Start: 2023-06-26 — End: 2023-06-28
  Administered 2023-06-27: 5 mg via INTRAVENOUS
  Filled 2023-06-26: qty 5

## 2023-06-26 MED ORDER — INSULIN ASPART 100 UNIT/ML IJ SOLN
0.0000 [IU] | Freq: Three times a day (TID) | INTRAMUSCULAR | Status: DC
  Administered 2023-06-27: 2 [IU] via SUBCUTANEOUS
  Administered 2023-06-27: 3 [IU] via SUBCUTANEOUS
  Administered 2023-06-28: 2 [IU] via SUBCUTANEOUS
  Filled 2023-06-26 (×2): qty 1

## 2023-06-26 MED ORDER — ONDANSETRON HCL 4 MG/2ML IJ SOLN: 4.0000 mg | Freq: Four times a day (QID) | INTRAMUSCULAR | Status: DC | PRN

## 2023-06-26 MED ORDER — INSULIN ASPART 100 UNIT/ML IJ SOLN
0.0000 [IU] | Freq: Every day | INTRAMUSCULAR | Status: DC
  Administered 2023-06-26: 3 [IU] via SUBCUTANEOUS
  Administered 2023-06-27: 2 [IU] via SUBCUTANEOUS
  Filled 2023-06-26 (×2): qty 1

## 2023-06-26 MED ORDER — GUAIFENESIN 100 MG/5ML PO LIQD
5.0000 mL | ORAL | Status: DC | PRN
  Administered 2023-06-26: 5 mL via ORAL
  Filled 2023-06-26: qty 10

## 2023-06-26 MED ORDER — ASPIRIN 81 MG PO CHEW
81.0000 mg | CHEWABLE_TABLET | Freq: Every day | ORAL | Status: DC
Start: 2023-06-27 — End: 2023-06-28
  Administered 2023-06-27 – 2023-06-28 (×2): 81 mg via ORAL
  Filled 2023-06-26 (×2): qty 1

## 2023-06-26 MED ORDER — TORSEMIDE 20 MG PO TABS
20.0000 mg | ORAL_TABLET | Freq: Every day | ORAL | Status: DC
  Administered 2023-06-26 – 2023-06-28 (×3): 20 mg via ORAL
  Filled 2023-06-26 (×3): qty 1

## 2023-06-26 MED ORDER — ISOSORBIDE MONONITRATE ER 60 MG PO TB24: 30.0000 mg | ORAL_TABLET | Freq: Every day | ORAL | Status: DC

## 2023-06-26 MED ORDER — SODIUM CHLORIDE 0.9% FLUSH: 3.0000 mL | INTRAVENOUS | Status: DC | PRN

## 2023-06-26 MED ORDER — METFORMIN HCL 500 MG PO TABS: 1000.0000 mg | ORAL_TABLET | Freq: Every day | ORAL | Status: DC

## 2023-06-26 MED ORDER — AMIODARONE HCL 200 MG PO TABS
200.0000 mg | ORAL_TABLET | Freq: Every day | ORAL | Status: DC
  Administered 2023-06-26 – 2023-06-28 (×3): 200 mg via ORAL
  Filled 2023-06-26 (×3): qty 1

## 2023-06-26 MED ORDER — IPRATROPIUM BROMIDE 0.02 % IN SOLN: 0.5000 mg | Freq: Four times a day (QID) | RESPIRATORY_TRACT | Status: DC | PRN

## 2023-06-26 MED ORDER — PANTOPRAZOLE SODIUM 20 MG PO TBEC
20.0000 mg | DELAYED_RELEASE_TABLET | Freq: Every day | ORAL | Status: DC
  Administered 2023-06-26 – 2023-06-28 (×3): 20 mg via ORAL
  Filled 2023-06-26 (×3): qty 1

## 2023-06-26 MED ORDER — ACETAMINOPHEN 325 MG PO TABS: 650.0000 mg | ORAL_TABLET | ORAL | Status: DC | PRN

## 2023-06-26 MED ORDER — HEPARIN BOLUS VIA INFUSION
4750.0000 [IU] | Freq: Once | INTRAVENOUS | Status: AC
Start: 2023-06-26 — End: 2023-06-26
  Administered 2023-06-26: 4750 [IU] via INTRAVENOUS
  Filled 2023-06-26: qty 4750

## 2023-06-26 MED ORDER — SACUBITRIL-VALSARTAN 24-26 MG PO TABS
1.0000 | ORAL_TABLET | Freq: Two times a day (BID) | ORAL | Status: DC
Start: 2023-06-26 — End: 2023-06-28
  Administered 2023-06-26 – 2023-06-28 (×3): 1 via ORAL
  Filled 2023-06-26 (×6): qty 1

## 2023-06-26 MED ORDER — IPRATROPIUM-ALBUTEROL 0.5-2.5 (3) MG/3ML IN SOLN
6.0000 mL | Freq: Once | RESPIRATORY_TRACT | Status: AC
  Administered 2023-06-26: 6 mL via RESPIRATORY_TRACT
  Filled 2023-06-26: qty 3

## 2023-06-26 MED ORDER — METOPROLOL TARTRATE 50 MG PO TABS
50.0000 mg | ORAL_TABLET | Freq: Two times a day (BID) | ORAL | Status: DC
  Administered 2023-06-26 – 2023-06-28 (×3): 50 mg via ORAL
  Filled 2023-06-26 (×4): qty 1

## 2023-06-26 MED ORDER — BUDESONIDE 0.25 MG/2ML IN SUSP
0.2500 mg | Freq: Two times a day (BID) | RESPIRATORY_TRACT | Status: DC
  Administered 2023-06-26: 0.25 mg via RESPIRATORY_TRACT
  Filled 2023-06-26 (×3): qty 2

## 2023-06-26 MED ORDER — IPRATROPIUM BROMIDE 0.02 % IN SOLN
0.5000 mg | Freq: Four times a day (QID) | RESPIRATORY_TRACT | Status: DC
  Administered 2023-06-26 – 2023-06-27 (×3): 0.5 mg via RESPIRATORY_TRACT
  Filled 2023-06-26 (×5): qty 2.5

## 2023-06-26 MED ORDER — ACETAMINOPHEN 500 MG PO TABS: 1000.0000 mg | ORAL_TABLET | Freq: Three times a day (TID) | ORAL | Status: DC | PRN

## 2023-06-26 MED ORDER — TRAMADOL HCL 50 MG PO TABS: 50.0000 mg | ORAL_TABLET | Freq: Every day | ORAL | Status: DC | PRN

## 2023-06-26 MED ORDER — HEPARIN (PORCINE) 25000 UT/250ML-% IV SOLN
1500.0000 [IU]/h | INTRAVENOUS | Status: DC
  Administered 2023-06-26: 1500 [IU]/h via INTRAVENOUS
  Filled 2023-06-26: qty 250

## 2023-06-26 MED ORDER — SODIUM CHLORIDE 0.9 % IV SOLN
250.0000 mL | INTRAVENOUS | Status: AC | PRN
Start: 2023-06-26 — End: 2023-06-27

## 2023-06-26 MED ORDER — ATORVASTATIN CALCIUM 20 MG PO TABS
40.0000 mg | ORAL_TABLET | Freq: Every day | ORAL | Status: DC
  Administered 2023-06-26 – 2023-06-28 (×3): 40 mg via ORAL
  Filled 2023-06-26 (×3): qty 2

## 2023-06-26 MED ORDER — IOHEXOL 350 MG/ML SOLN
100.0000 mL | Freq: Once | INTRAVENOUS | Status: AC | PRN
  Administered 2023-06-26: 100 mL via INTRAVENOUS

## 2023-06-26 MED ORDER — SODIUM CHLORIDE 0.9% FLUSH
3.0000 mL | Freq: Two times a day (BID) | INTRAVENOUS | Status: DC
  Administered 2023-06-26 – 2023-06-28 (×4): 3 mL via INTRAVENOUS

## 2023-06-26 NOTE — ED Notes (Signed)
 Got patient dinner tray.

## 2023-06-26 NOTE — ED Provider Notes (Signed)
 CT concerning for acute PE. Discussed findings with patient. Will start heparin. Will discuss with hospitalist for admission.   Phineas Semen, MD 06/26/23 [phone]

## 2023-06-26 NOTE — ED Notes (Signed)
 Called dining services for patient

## 2023-06-26 NOTE — ED Notes (Signed)
 Acuity changed due to EKG per MD

## 2023-06-26 NOTE — ED Triage Notes (Signed)
 Pt comes via EMs from [facility] on [address] Pt states weakness and cough for three days. Pt unable to control bladder. Pt has not been taking his meds  CBG 308 HR-125 99% RA BP-149/70  Call [facility]r for transport back home. [phone]
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
## 2023-06-26 NOTE — ED Notes (Signed)
 Charge Rn [place] informed of pt needing next bed and cardiac monitoring. Pt to get next available bed.

## 2023-06-26 NOTE — ED Provider Triage Note (Signed)
 Emergency Medicine Provider Triage Evaluation Note  Jeremy [name] , a 68 y.o. male  was evaluated in triage.  Pt complains of cough and congestion x 1 week. Reports pain in his chest with cough. No fever.   Physical Exam  BP 135/77 (BP Location: Left Arm)   Pulse (!) 126   Temp (!) 97.5 F (36.4 C) (Oral)   Resp 18   Ht 5' 11 (1.803 m)   Wt 99.8 kg   SpO2 97%   BMI 30.68 kg/m  Gen:   Awake, no distress   Resp:  Normal effort  MSK:   Moves extremities without difficulty  Other:    Medical Decision Making  Medically screening exam initiated at 11:37 AM.  Appropriate orders placed.  Jeremy [name] was informed that the remainder of the evaluation will be completed by another provider, this initial triage assessment does not replace that evaluation, and the importance of remaining in the ED until their evaluation is complete.  Tachycardic in triage. Labs, chest xray, and EKG started.   Jeremy Kirk NOVAK, FNP 06/26/23 1141

## 2023-06-26 NOTE — ED Notes (Addendum)
 Unsucessful attempt at getting blood by this RN. Phlebotomy called at [phone]

## 2023-06-26 NOTE — ED Notes (Signed)
 Sat patient up to eat dinner tray.

## 2023-06-26 NOTE — ED Provider Notes (Signed)
 [facility] Provider Note    Event Date/Time   First MD Initiated Contact with Patient 06/26/23 1328     (approximate)   History   Cough and Nasal Congestion   HPI  Jeremy [name] is a 68 y.o. male who presents to the ED for evaluation of Cough and Nasal Congestion   I review a cardiology clinic visit from 1/3.  Patient has a history of DVT, A-fib with RVR, CAD s/p CABG, CKD 3, DM, tobacco abuse.  Patient presents with cough, congestion and chest discomfort for the past few days.  Reports that his girlfriend kicked him out and he has been staying at a [facility] for the past few days and not taking his medications.  Typically anticoagulated on rivaroxaban    Physical Exam   Triage Vital Signs: ED Triage Vitals  Encounter Vitals Group     BP 06/26/23 1135 135/77     Systolic BP Percentile --      Diastolic BP Percentile --      Pulse Rate 06/26/23 1135 (!) 126     Resp 06/26/23 1135 18     Temp 06/26/23 1135 (!) 97.5 F (36.4 C)     Temp Source 06/26/23 1135 Oral     SpO2 06/26/23 1135 97 %     Weight 06/26/23 1136 220 lb (99.8 kg)     Height 06/26/23 1136 5' 11 (1.803 m)     Head Circumference --      Peak Flow --      Pain Score 06/26/23 1136 0     Pain Loc --      Pain Education --      Exclude from Growth Chart --     Most recent vital signs: Vitals:   06/26/23 1135 06/26/23 1450  BP: 135/77 105/72  Pulse: (!) 126 (!) 101  Resp: 18 (!) 22  Temp: (!) 97.5 F (36.4 C) 98.1 F (36.7 C)  SpO2: 97% 97%    General: Awake, frequent coughing with a wet-sounding cough CV:  Good peripheral perfusion.  Tachycardic and regular Resp:  Tachypnea, coughing, wheezing, slightly decreased airflow Abd:  No distention.  Soft MSK:  No deformity noted.  Neuro:  No focal deficits appreciated. Other:     ED Results / Procedures / Treatments   Labs (all labs ordered are listed, but only abnormal results are displayed) Labs  Reviewed  BASIC METABOLIC PANEL - Abnormal; Notable for the following components:      Result Value   Sodium 134 (*)    CO2 18 (*)    Glucose, Bld 142 (*)    Calcium  8.8 (*)    All other components within normal limits  TROPONIN I (HIGH SENSITIVITY) - Abnormal; Notable for the following components:   Troponin I (High Sensitivity) 18 (*)    All other components within normal limits  RESP PANEL BY RT-PCR (RSV, FLU A&B, COVID)  [id number]  CBC WITH DIFFERENTIAL/PLATELET  TROPONIN I (HIGH SENSITIVITY)    EKG Sinus tach with a rate of 125 bpm.  Normal axis and intervals.  Nonspecific changes without STEMI.  RADIOLOGY CXR interpreted by me without evidence of acute cardiopulmonary pathology.  Official radiology report(s): DG Chest 2 View Result Date: 06/26/2023 CLINICAL DATA:  Cough for 1 week. EXAM: CHEST - 2 VIEW COMPARISON:  04/28/2023 FINDINGS: The heart size and mediastinal contours are within normal limits. Prior CABG again noted. Both lungs are clear. The visualized skeletal structures are unremarkable.  IMPRESSION: No active cardiopulmonary disease. Electronically Signed   By: Norleen DELENA Kil M.D.   On: 06/26/2023 12:26    PROCEDURES and INTERVENTIONS:  .1-3 Lead EKG Interpretation  Performed by: Claudene Rover, MD Authorized by: Claudene Rover, MD     Interpretation: abnormal     ECG rate:  110   ECG rate assessment: tachycardic     Rhythm: sinus tachycardia     Ectopy: none     Conduction: normal   .Critical Care  Performed by: Claudene Rover, MD Authorized by: Claudene Rover, MD   Critical care provider statement:    Critical care time (minutes):  30   Critical care time was exclusive of:  Separately billable procedures and treating other patients   Critical care was necessary to treat or prevent imminent or life-threatening deterioration of the following conditions:  Respiratory failure   Critical care was time spent personally by me on the following activities:  Development of  treatment plan with patient or surrogate, discussions with consultants, evaluation of patient's response to treatment, examination of patient, ordering and review of laboratory studies, ordering and review of radiographic studies, ordering and performing treatments and interventions, pulse oximetry, re-evaluation of patient's condition and review of old charts .Ultrasound ED Peripheral IV (Provider)  Date/Time: 06/26/2023 3:27 PM  Performed by: Claudene Rover, MD Authorized by: Claudene Rover, MD   Procedure details:    Indications: multiple failed IV attempts and poor IV access     Skin Prep: chlorhexidine  gluconate     Location: Right upper extremity.   Angiocath:  20 G   Bedside Ultrasound Guided: Yes     Images: not archived     Patient tolerated procedure without complications: Yes     Dressing applied: Yes     Medications  ipratropium-albuterol  (DUONEB) 0.5-2.5 (3) MG/3ML nebulizer solution 6 mL (6 mLs Nebulization Given 06/26/23 1359)  methylPREDNISolone  sodium succinate  (SOLU-MEDROL ) 125 mg/2 mL injection 125 mg (125 mg Intravenous Given 06/26/23 1442)  iohexol  (OMNIPAQUE ) 350 MG/ML injection 100 mL (100 mLs Intravenous Contrast Given 06/26/23 1459)     IMPRESSION / MDM / ASSESSMENT AND PLAN / ED COURSE  I reviewed the triage vital signs and the nursing notes.  Differential diagnosis includes, but is not limited to, ACS, PTX, PNA, muscle strain/spasm, PE, dissection, anxiety, pleural effusion  {Patient presents with symptoms of an acute illness or injury that is potentially life-threatening.  Patient presents with evidence of a COPD exacerbation.  Tachycardic, tachypneic and wheezing, improving with breathing treatments.  Normal CBC.  Normal electrolytes.  Mildly elevated troponins.  Negative viral swabs.  We will CTA his chest to evaluate for PE and pneumonia considering his noncompliance with Xarelto  and his cough.  Signed out to oncoming physician  Clinical Course as of 06/26/23  1528  Sun Jun 26, 2023  1500 USIV placed by me. Right basilic v  [DS]    Clinical Course User Index [DS] Claudene Rover, MD     FINAL CLINICAL IMPRESSION(S) / ED DIAGNOSES   Final diagnoses:  COPD exacerbation (HCC)  Acute cough  Other chest pain     Rx / DC Orders   ED Discharge Orders     None        Note:  This document was prepared using Dragon voice recognition software and may include unintentional dictation errors.   Claudene Rover, MD 06/26/23 [phone]

## 2023-06-26 NOTE — H&P (Signed)
 History and Physical    Jeremy [name] FMW:[id number] DOB: [birth year] DOA: 06/26/2023  PCP: Jeremy Manna, MD (Confirm with patient/family/NH records and if not entered, this has to be entered at [facility] point of entry) Patient coming from: Home  I have personally briefly reviewed patient's old medical records in [facility]  Chief Complaint: Cough, feeling weak  HPI: Jeremy [name] is a 68 y.o. male with medical history significant of CAD/NSTEMI/CABG, HTN, HLD, chronic HFpEf, COPD Gold stage I, PAF on Xarelto , IIDM, medical noncompliance, polysubstance abuse, presented with worsening of cough and palpitations.  Symptoms started 4 days ago, patient started develop dry cough, and congestion, and palpitation but no chest pain, no fever or chills.  Patient claims that he ran out of Xarelto  sometime in November.  He is homeless and due to vision problems  try my best to take my pills every day but he cannot confirm with me which pills he takes or not take.  He denied any leg pain or swelling.  Last 2 days he also has had increase of urine great frequency but denied any burning sensations no back pain.  No abdominal pain no diarrhea.  ED Course: Patient was found to be in rapid tachycardia, O2 saturation 97% on room air and blood pressure borderline low.  CTA showed segmental PE on left lower lobe and right upper lobe with small clot burden.  Blood work showed sodium 134, potassium 4.1, creatinine 1.1, K4.1, bicarb 18, WBC 7.3, hemoglobin 15.1.  Patient was started on heparin  drip in the ED.  Review of Systems: As per HPI otherwise 14 point review of systems negative.    Past Medical History:  Diagnosis Date   Coronary artery disease    Diabetes mellitus without complication (HCC)    Hypertension    S/P CABG x 1     Past Surgical History:  Procedure Laterality Date   ANKLE ARTHROSCOPY W/ OPEN REPAIR Right    CORONARY ARTERY BYPASS GRAFT     PULMONARY THROMBECTOMY Bilateral  12/24/2020   Procedure: PULMONARY THROMBECTOMY;  Surgeon: Marea Selinda RAMAN, MD;  Location: [facility] INVASIVE CV LAB;  Service: Cardiovascular;  Laterality: Bilateral;     reports that he has been smoking cigarettes. He has never used smokeless tobacco. He reports current alcohol  use of about 3.0 standard drinks of alcohol  per week. He reports that he does not currently use drugs after having used the following drugs: Marijuana.  Allergies  Allergen Reactions   Amoxicillin  Swelling   Keflex  [Cephalexin ] Rash    Family History  Problem Relation Age of Onset   Heart attack Father      Prior to Admission medications   Medication Sig Start Date End Date Taking? Authorizing Provider  acetaminophen  (TYLENOL ) 500 MG tablet Take 2 tablets (1,000 mg total) by mouth every 8 (eight) hours as needed for moderate pain. 12/11/22 12/11/23  Dorothyann Drivers, MD  amiodarone  (PACERONE ) 200 MG tablet Take 1 tablet (200 mg total) by mouth daily. 06/09/23     aspirin  81 MG chewable tablet Chew 1 tablet (81 mg total) by mouth daily. 11/27/22   Alexander, Natalie, DO  atorvastatin  (LIPITOR) 40 MG tablet Take 1 tablet (40 mg total) by mouth daily. 11/27/22   Alexander, Natalie, DO  carvedilol  (COREG ) 6.25 MG tablet Take 1 tablet (6.25 mg total) by mouth 2 (two) times daily with meals. 06/09/23     cephALEXin  (KEFLEX ) 500 MG capsule Take 1 capsule (500 mg total) by mouth 3 (three) times daily  for 7 days 06/17/23     doxycycline  (MONODOX ) 100 MG capsule Take 1 capsule (100 mg total) by mouth 2 (two) times daily for cellulitis and right ankle 03/17/23     doxycycline  (VIBRA -TABS) 100 MG tablet Take 1 tablet (100 mg total) by mouth 2 (two) times daily for 7 days. 03/28/23     doxycycline  (VIBRAMYCIN ) 100 MG capsule Take 1 capsule (100 mg total) by mouth 2 (two) times daily for 10 days. 03/22/23   Reynaldo Rav, MD  furosemide  (LASIX ) 20 MG tablet Take 1 tablet (20 mg total) by mouth daily as needed for edema. 11/26/22   Alexander,  Natalie, DO  furosemide  (LASIX ) 20 MG tablet Take 1 tablet (20 mg total) by mouth daily. 06/09/23     isosorbide  mononitrate (IMDUR ) 30 MG 24 hr tablet Take 1 tablet (30 mg total) by mouth daily. 11/27/22   Alexander, Natalie, DO  isosorbide  mononitrate (IMDUR ) 30 MG 24 hr tablet Take 1 tablet (30 mg total) by mouth 2 (two) times daily for Maintenance 03/17/23     LINZESS  72 MCG capsule Take 72 mcg by mouth daily as needed.    [provider]  losartan  (COZAAR ) 50 MG tablet Take 1 tablet (50 mg total) by mouth daily for hypertension 04/29/23 08/27/23  Cyrena Mylar, MD  metFORMIN  (GLUCOPHAGE ) 1000 MG tablet Take 1 tablet (1,000 mg total) by mouth once daily with breakfast. 11/26/22   Marsa Edelman, DO  metFORMIN  (GLUCOPHAGE ) 1000 MG tablet Take 1 tablet (1,000 mg total) by mouth 2 (two) times daily for diabetes 03/17/23     metoprolol  succinate (TOPROL -XL) 25 MG 24 hr tablet Take 1 tablet (25 mg total) by mouth daily for blood pressure 04/29/23 10/26/23  Cyrena Mylar, MD  mupirocin  ointment (BACTROBAN ) 2 % Apply 1 Application topically daily. 03/22/23   Reynaldo Rav, MD  nitroGLYCERIN  (NITROSTAT ) 0.4 MG SL tablet Place 1 tablet (0.4 mg total) under the tongue every 5 (five) minutes as needed for chest pain. 11/26/22   Alexander, Natalie, DO  pantoprazole  (PROTONIX ) 20 MG tablet Take 1 tablet (20 mg total) by mouth daily. 11/27/22   Alexander, Natalie, DO  pantoprazole  (PROTONIX ) 20 MG tablet Take 1 tablet (20 mg total) by mouth daily for GERD 03/17/23     rivaroxaban  (XARELTO ) 20 MG TABS tablet Take 1 tablet (20 mg total) by mouth daily at 5 pm 04/29/23 10/26/23  Cyrena Mylar, MD  rivaroxaban  (XARELTO ) 20 MG TABS tablet Take 1 tablet (20 mg total) by mouth daily. 06/09/23     sacubitril -valsartan  (ENTRESTO ) 24-26 MG Take 1 tablet by mouth every 12 (twelve) hours. 06/09/23     spironolactone  (ALDACTONE ) 25 MG tablet Take 0.5 tablets (12.5 mg total) by mouth daily. 06/09/23     tadalafil  (CIALIS ) 20 MG  tablet Take 1 tablet (20 mg total) by mouth daily as needed for erectile dysfunction 06/03/23     tadalafil  (CIALIS ) 20 MG tablet Take 1 tablet (20 mg total) by mouth daily as needed for Erectile Dysfunction 06/17/23     torsemide  (DEMADEX ) 20 MG tablet Take 1 tablet (20 mg total) by mouth daily. 06/17/23     traMADol  (ULTRAM ) 50 MG tablet Take 1 tablet (50 mg total) by mouth daily as needed for pain. 06/03/23     traMADol  (ULTRAM ) 50 MG tablet Take 1 tablet (50 mg total) by mouth daily as needed for pain 06/17/23   Florencio Cara BIRCH, MD    Physical Exam: Vitals:   06/26/23 1135 06/26/23 1136  06/26/23 1450  BP: 135/77  105/72  Pulse: (!) 126  (!) 101  Resp: 18  (!) 22  Temp: (!) 97.5 F (36.4 C)  98.1 F (36.7 C)  TempSrc: Oral  Oral  SpO2: 97%  97%  Weight:  99.8 kg   Height:  5' 11 (1.803 m)     Constitutional: NAD, calm, comfortable Vitals:   06/26/23 1135 06/26/23 1136 06/26/23 1450  BP: 135/77  105/72  Pulse: (!) 126  (!) 101  Resp: 18  (!) 22  Temp: (!) 97.5 F (36.4 C)  98.1 F (36.7 C)  TempSrc: Oral  Oral  SpO2: 97%  97%  Weight:  99.8 kg   Height:  5' 11 (1.803 m)    Eyes: PERRL, lids and conjunctivae normal ENMT: Mucous membranes are moist. Posterior pharynx clear of any exudate or lesions.Normal dentition.  Neck: normal, supple, no masses, no thyromegaly Respiratory: clear to auscultation bilaterally, scattered crackles and scattered wheezing. Normal respiratory effort. No accessory muscle use.  Cardiovascular: Regular rate and rhythm, no murmurs / rubs / gallops. No extremity edema. 2+ pedal pulses. No carotid bruits.  Abdomen: no tenderness, no masses palpated. No hepatosplenomegaly. Bowel sounds positive.  Musculoskeletal: no clubbing / cyanosis. No joint deformity upper and lower extremities. Good ROM, no contractures. Normal muscle tone.  Skin: no rashes, lesions, ulcers. No induration Neurologic: CN 2-12 grossly intact. Sensation intact, DTR normal. Strength  5/5 in all 4.  Psychiatric: Normal judgment and insight. Alert and oriented x 3. Normal mood.     Labs on Admission: I have personally reviewed following labs and imaging studies  CBC: Recent Labs  Lab 06/26/23 1151  WBC 7.3  NEUTROABS 4.5  HGB 15.1  HCT 45.4  MCV 95.2  PLT 232   Basic Metabolic Panel: Recent Labs  Lab 06/26/23 1151  NA 134*  K 4.1  CL 108  CO2 18*  GLUCOSE 142*  BUN 18  CREATININE 1.14  CALCIUM  8.8*   GFR: Estimated Creatinine Clearance: 76.7 mL/min (by C-G formula based on SCr of 1.14 mg/dL). Liver Function Tests: No results for input(s): AST, ALT, ALKPHOS, BILITOT, PROT, ALBUMIN in the last 168 hours. No results for input(s): LIPASE, AMYLASE in the last 168 hours. No results for input(s): AMMONIA in the last 168 hours. Coagulation Profile: No results for input(s): INR, PROTIME in the last 168 hours. Cardiac Enzymes: No results for input(s): CKTOTAL, CKMB, CKMBINDEX, TROPONINI in the last 168 hours. BNP (last 3 results) No results for input(s): PROBNP in the last 8760 hours. HbA1C: No results for input(s): HGBA1C in the last 72 hours. CBG: No results for input(s): GLUCAP in the last 168 hours. Lipid Profile: No results for input(s): CHOL, HDL, LDLCALC, TRIG, CHOLHDL, LDLDIRECT in the last 72 hours. Thyroid  Function Tests: No results for input(s): TSH, T4TOTAL, FREET4, T3FREE, THYROIDAB in the last 72 hours. Anemia Panel: No results for input(s): VITAMINB12, FOLATE, FERRITIN, TIBC, IRON, RETICCTPCT in the last 72 hours. Urine analysis:    Component Value Date/Time   COLORURINE YELLOW (A) 12/26/2020 2218   APPEARANCEUR CLOUDY (A) 12/26/2020 2218   LABSPEC 1.017 12/26/2020 2218   PHURINE 5.0 12/26/2020 2218   GLUCOSEU NEGATIVE 12/26/2020 2218   HGBUR NEGATIVE 12/26/2020 2218   BILIRUBINUR NEGATIVE 12/26/2020 2218   KETONESUR NEGATIVE 12/26/2020 2218   PROTEINUR  NEGATIVE 12/26/2020 2218   NITRITE NEGATIVE 12/26/2020 2218   LEUKOCYTESUR TRACE (A) 12/26/2020 2218    Radiological Exams on Admission: CT Angio Chest PE W  and/or Wo Contrast Result Date: 06/26/2023 CLINICAL DATA:  Cough and chest pain. EXAM: CT ANGIOGRAPHY CHEST WITH CONTRAST TECHNIQUE: Multidetector CT imaging of the chest was performed using the standard protocol during bolus administration of intravenous contrast. Multiplanar CT image reconstructions and MIPs were obtained to evaluate the vascular anatomy. RADIATION DOSE REDUCTION: This exam was performed according to the departmental dose-optimization program which includes automated exposure control, adjustment of the mA and/or kV according to patient size and/or use of iterative reconstruction technique. CONTRAST:  OMNIPAQUE  IOHEXOL  350 MG/ML SOLN COMPARISON:  Chest radiograph 06/26/2023 and CT scan 12/22/2020 FINDINGS: Cardiovascular: Small filling defect in the anterior segmental branch of the left lower lobe pulmonary artery compatible with pulmonary embolus. Segmental level filling defect posteriorly in the right upper lobe on images 49 through 54 of series 4 observed, a component of this could be chronic given some peripheral location, but superimposed acute embolus is likely. Clot burden is small. Moderate cardiomegaly. Prior CABG. Atherosclerosis is present, including aortoiliac atherosclerotic disease. Right ventricular to ref ventricular ratio calculation does not apply due to the lack of lobar or larger emboli, but is measured the RV: LV ratio is 0.88. Prominence of the main pulmonary artery favoring pulmonary arterial hypertension. Mediastinum/Nodes: Borderline prominent hilar nodal structures without overt pathologic adenopathy. Lungs/Pleura: Airway thickening is present, suggesting bronchitis or reactive airways disease. Mucus is present in the right lower lobe bronchus, and mucous plugging is present both lower lobes. 8 by 4 by 4  mm (volume = 70 mm^3) subpleural nodule posteriorly in the right middle lobe on image 75 series 6, previously 11 by 7 by 4 mm on 12/22/2020. Given reduced size compared to 2.5 years ago, this nodule is considered benign. Upper Abdomen: Unremarkable Musculoskeletal: Degenerative glenohumeral arthropathy bilaterally. Mild lower thoracic spondylosis. Review of the MIP images confirms the above findings. IMPRESSION: 1. Segmental level filling defects anteriorly in the left lower lobe and in the right upper lobe pulmonary arteries compatible with pulmonary embolus. The right upper lobe pulmonary embolus has a chronic component although may also have an acute component. Clot burden is small. No lobar or greater size embolus; right ventricular to left ventricular ratio is within normal limits. 2. Prominence of the main pulmonary artery favoring pulmonary arterial hypertension. 3. Moderate cardiomegaly. Prior CABG. 4. Airway thickening is present, suggesting bronchitis or reactive airways disease. Mucus is present in the right lower lobe bronchus, and mucous plugging is present in both lower lobes. 5. Degenerative glenohumeral arthropathy bilaterally. Mild lower thoracic spondylosis. 6. Aortic and coronary atherosclerosis. Aortic Atherosclerosis (ICD10-I70.0). Critical Value/emergent results were called by telephone at the time of interpretation on 06/26/2023 at 3:49 pm to provider Dr. Floy, who verbally acknowledged these results. Electronically Signed   By: Ryan Salvage M.D.   On: 06/26/2023 15:49   DG Chest 2 View Result Date: 06/26/2023 CLINICAL DATA:  Cough for 1 week. EXAM: CHEST - 2 VIEW COMPARISON:  04/28/2023 FINDINGS: The heart size and mediastinal contours are within normal limits. Prior CABG again noted. Both lungs are clear. The visualized skeletal structures are unremarkable. IMPRESSION: No active cardiopulmonary disease. Electronically Signed   By: Norleen DELENA Kil M.D.   On: 06/26/2023 12:26     EKG: Independently reviewed.  Sinus tachycardia, no acute ST changes.  Assessment/Plan Principal Problem:   Afib (HCC) Active Problems:   Acute pulmonary embolism (HCC)   Atrial fibrillation with RVR (HCC)   COPD with acute exacerbation (HCC)  (please populate well all problems here in  Problem List. (For example, if patient is on BP meds at home and you resume or decide to hold them, it is a problem that needs to be her. Same for CAD, COPD, HLD and so on)  Acute bilateral segmental PE, on chronic bilateral PE -Along with a known PAF, indication for lifelong anticoagulation.  Patient however is known for his noncompliance with medications particularly anticoagulations. -ED started patient on heparin  drip, if H&H stable, likely can switch to Xarelto  tomorrow -Check echocardiogram and DVT study  Tachycardia, sinus -Secondary to PE -CTA showed bilateral subsegmental PE however no signs of right heart strain and troponin level remains low. -Medically patient appears to be euvolemic, will try to increase his metoprolol  to control heart rate  Acute bronchitis/acute COPD exacerbation -Atrovent  nebulizing, trial of Pulmicort . Avoid albuterol . -Guaifenesin  -Incentive spirometry and flutter valve -Monitor off antibiotics  PAF -In sinus tachy from PE likely, management as above -Non compliant with Xarelto . Probably can go back to Xarelto  tomorrow.  Dysuria -Send UA to rule out UTI  Chronic HFpEF HTN -Euvolemic, blood pressure borderline low -Hold off spironolactone  and Imdur  today -Resume p.o. torsemide  tomorrow -Increase metoprolol  for rate control and continue Entresto   IIDM -Hold off metformin  as patient received IV contrast today -SSI  Medical noncompliance -Claimed that he has been homeless, with poor vision, sometimes hard to read the labels on the pill boxes and patient requests home visiting nurse vs other help. Will consult case management.  DVT prophylaxis:  Heparin  drip Code Status: Full code Family Communication: None at bedside Disposition Plan: Expect less than 2 midnight hospital stay Consults called: None Admission status: PCU obs   Cort ONEIDA Mana MD [facility] Pager [phone]  06/26/2023, 4:28 PM

## 2023-06-27 ENCOUNTER — Observation Stay
Admit: 2023-06-27 | Discharge: 2023-06-27 | Disposition: A | Discharge status: Home / Self Care | Payer: 59 | Attending: Internal Medicine

## 2023-06-27 ENCOUNTER — Observation Stay
Admit: 2023-06-27 | Discharge: 2023-06-27 | Disposition: A | Discharge status: Home / Self Care | Payer: 59 | Attending: Internal Medicine | Admitting: Internal Medicine

## 2023-06-27 DIAGNOSIS — I2699 Other pulmonary embolism without acute cor pulmonale: Secondary | ICD-10-CM

## 2023-06-27 DIAGNOSIS — I480 Paroxysmal atrial fibrillation: Secondary | ICD-10-CM | POA: Diagnosis not present

## 2023-06-27 LAB — HIV ANTIBODY (ROUTINE TESTING W REFLEX): HIV Screen 4th Generation wRfx: NONREACTIVE

## 2023-06-27 LAB — HEPARIN LEVEL (UNFRACTIONATED)
Heparin Unfractionated: 0.38 [IU]/mL (ref 0.30–0.70)
Heparin Unfractionated: 0.43 [IU]/mL (ref 0.30–0.70)

## 2023-06-27 LAB — ECHOCARDIOGRAM COMPLETE
AR max vel: 3.04 cm2
AV Area VTI: 3.23 cm2
AV Area mean vel: 3.13 cm2
AV Mean grad: 2.3 mm[Hg]
AV Peak grad: 4.9 mm[Hg]
Ao pk vel: 1.11 m/s
Area-P 1/2: 4.74 cm2
Height: 71 in
MV VTI: 4.13 cm2
P 1/2 time: 779 ms
S' Lateral: 4.9 cm
Weight: 3520 [oz_av]

## 2023-06-27 LAB — BASIC METABOLIC PANEL
Anion gap: 10 (ref 5–15)
BUN: 25 mg/dL — ABNORMAL HIGH (ref 8–23)
CO2: 22 mmol/L (ref 22–32)
Calcium: 9 mg/dL (ref 8.9–10.3)
Chloride: 104 mmol/L (ref 98–111)
Creatinine, Ser: 1.59 mg/dL — ABNORMAL HIGH (ref 0.61–1.24)
GFR, Estimated: 48 mL/min — ABNORMAL LOW (ref >60)
Glucose, Bld: 182 mg/dL — ABNORMAL HIGH (ref 70–99)
Potassium: 4.5 mmol/L (ref 3.5–5.1)
Sodium: 136 mmol/L (ref 135–145)

## 2023-06-27 LAB — CBC
HCT: 44.6 % (ref 39.0–52.0)
Hemoglobin: 14.9 g/dL (ref 13.0–17.0)
MCH: 31.6 pg (ref 26.0–34.0)
MCHC: 33.4 g/dL (ref 30.0–36.0)
MCV: 94.7 fL (ref 80.0–100.0)
Platelets: 275 10*3/uL (ref 150–400)
RBC: 4.71 MIL/uL (ref 4.22–5.81)
RDW: 14.5 % (ref 11.5–15.5)
WBC: 17.5 10*3/uL — ABNORMAL HIGH (ref 4.0–10.5)
nRBC: 0 % (ref 0.0–0.2)

## 2023-06-27 LAB — CBG MONITORING, ED
Glucose-Capillary: 155 mg/dL — ABNORMAL HIGH (ref 70–99)
Glucose-Capillary: 132 mg/dL — ABNORMAL HIGH (ref 70–99)
Glucose-Capillary: 140 mg/dL — ABNORMAL HIGH (ref 70–99)

## 2023-06-27 LAB — GLUCOSE, CAPILLARY: Glucose-Capillary: 232 mg/dL — ABNORMAL HIGH (ref 70–99)

## 2023-06-27 MED ORDER — PERFLUTREN LIPID MICROSPHERE
1.0000 mL | INTRAVENOUS | Status: AC | PRN
  Administered 2023-06-27: 6 mL via INTRAVENOUS

## 2023-06-27 MED ORDER — HEPARIN (PORCINE) 25000 UT/250ML-% IV SOLN
1850.0000 [IU]/h | INTRAVENOUS | Status: DC
  Administered 2023-06-27: 1500 [IU]/h via INTRAVENOUS
  Administered 2023-06-28: 1850 [IU]/h via INTRAVENOUS
  Filled 2023-06-27 (×3): qty 250

## 2023-06-27 NOTE — Progress Notes (Signed)
 PHARMACY - ANTICOAGULATION CONSULT NOTE  Pharmacy Consult for Heparin  Infusion Indication: pulmonary embolus  Allergies  Allergen Reactions   Amoxicillin  Swelling   Keflex  [Cephalexin ] Rash    Patient Measurements: Height: 5' 11 (180.3 cm) Weight: 99.8 kg (220 lb) IBW/kg (Calculated) : 75.3 Heparin  Dosing Weight: 95.8 kg  Vital Signs: Temp: 97.6 F (36.4 C) (01/13 0208) Temp Source: Oral (01/13 0208) BP: 85/60 (01/13 0800) Pulse Rate: 117 (01/13 0830)  Labs: Recent Labs    06/26/23 1151 06/26/23 1347 06/26/23 1353 06/26/23 1708 06/27/23 0021 06/27/23 0841  HGB 15.1  --   --   --   --  14.9  HCT 45.4  --   --   --   --  44.6  PLT 232  --   --   --   --  275  APTT  --   --   --  32  --   --   LABPROT  --   --   --  13.3  --   --   INR  --   --   --  1.0  --   --   HEPARINUNFRC  --   --   --   --  0.38 0.43  CREATININE 1.14  --   --   --   --   --   TROPONINIHS  --  15 18*  --   --   --     Estimated Creatinine Clearance: 76.7 mL/min (by C-G formula based on SCr of 1.14 mg/dL).   Medical History: Past Medical History:  Diagnosis Date   Coronary artery disease    Diabetes mellitus without complication (HCC)    Hypertension    S/P CABG x 1     Medications:  Patient is on rivaroxaban  20 mg daily at home per chart review-- according to patient, patient has been staying at a [facility]'s [facility] for the past few days and not taking his medications  Assessment: Patient is a 68 year old male with a  past medical history of DVT, Afib with RVR, CAD s/p CABG, CKD 3, DM, and tobacco abuse who reports cough, nasal congestion, and chest discomfort. CT chest showed Segmental level filling defects anteriorly in the left lower lobe and in the right upper lobe pulmonary arteries compatible with pulmonary embolus. The right upper lobe pulmonary embolus has a chronic component although may also have an acute component. Clot burden is small. No lobar or greater size embolus;  right ventricular to left ventricular ratio is within normal limits. Pharmacy has been consulted to initiate patient on a heparin  infusion.  Baseline INR and aPTT ordered.  No signs/symptoms of bleeding noted. Hgb 15.1. PLT 232.  Goal of Therapy:  Heparin  level 0.3-0.7 units/ml Monitor platelets by anticoagulation protocol: Yes  01/13 0021 HL 0.38, therapeutic x 1 01/13 0841 HL 0.43, therapeutic x 2   Plan:  Continue heparin  infusion at a rate of 1500 units/hr Check heparin  level w/ AM labs Monitor CBC daily while on heparin   Kayla JULIANNA Blew, PharmD 06/27/2023 9:42 AM

## 2023-06-27 NOTE — ED Notes (Signed)
 Heparin paused per verbal request of MD until echo results.

## 2023-06-27 NOTE — Progress Notes (Signed)
 Heart Failure Navigator Progress Note  Assessed for Heart & Vascular TOC clinic readiness.  Patient does not meet criteria due to [facility] patient of Dr. Dorothyann Peng.   Navigator will sign off at this time.  Roxy Horseman, RN, BSN [facility] Heart Failure Navigator Secure Chat Only

## 2023-06-27 NOTE — Hospital Course (Addendum)
 Hospital course / significant events:   HPI: Jeremy [name] is a 68 y.o. male with medical history significant of CAD/NSTEMI/CABG, HTN, HLD, chronic HFpEf, COPD Gold stage I, PAF on Xarelto , IIDM, medical noncompliance, polysubstance abuse, presented with worsening of cough and palpitations.   01/12: admitted for PE and Afib RVR though rate went down, started heparin  gtt, echo pending 01/13: Echo abn - compared to echo 11/2022: now LVEF 35-40% vs 55-60%, now LV global hypokinesis vs no RWMA, now RV mild/mod reduced systolic fxn vs normal fxn, Both - no significant valvular abn. Cardiology to consult - KC to see tomorrow possibly will need ischemic eval. Continue heparin .     Consultants:  Cardiology   Procedures/Surgeries: none      ASSESSMENT & PLAN:   Acute bilateral segmental PE, on chronic bilateral PE Along with a known PAF, indication for lifelong anticoagulation.  Patient however is known for his noncompliance with medications particularly anticoagulations. Heparin  to continue pending cardio eval  Eventual switch to home Xarelto    PAF  Tachycardia, sinus Likely secondary to PE Heparin  to continue pending cardio eval  Eventual switch to home Xarelto  Increase home metoprolol , amiodarone     Chronic HFpEF --> Echo demonstrates new chronic HFrEF and concern for mild RV strain though likely also chronic could be d/t or complicated by PE  HTN CAD Euvolemic, blood pressure borderline low Hold off spironolactone  and Imdur  today Increase metoprolol  for rate control  continue Entresto   Monitor BP Resume p.o. torsemide  per cardiology / if BP permits  Acute bronchitis/acute COPD exacerbation Atrovent  nebulizing, trial of Pulmicort . Avoid albuterol . Guaifenesin  Incentive spirometry and flutter valve Monitor off antibiotics   Dysuria UA normal Monitor     IDDM Hold off metformin  as patient received IV contrast  SSI   Medical noncompliance Claimed that he has been  homeless, with poor vision, sometimes hard to read the labels on the pill boxes and patient requests home visiting nurse vs other help.  consult case management.       Class 1 obesity based on BMI: Body mass index is 30.68 kg/m.  Underweight - under 18  overweight - 25 to 29 obese - 30 or more Class 1 obesity: BMI of 30.0 to 34 Class 2 obesity: BMI of 35.0 to 39 Class 3 obesity: BMI of 40.0 to 49 Super Morbid Obesity: BMI 50-59 Super-super Morbid Obesity: BMI 60+ Significantly low or high BMI is associated with higher medical risk.  Weight management advised as adjunct to other disease management and risk reduction treatments    DVT prophylaxis: heparin  IV fluids: no continuous IV fluids  Nutrition: cardiac/diabetic Central lines / invasive devices: none  Code Status: FULL CODE ACP documentation reviewed:  none on file in [facility]  TOC needs: med assistance  Barriers to dispo / significant pending items: cardiology eval possible w/u iscehmic heart disease given new echo findings          Cardiac meds on discharge  Entresto  24-26 mg twice daily  carvedilol  6.25 mg twice daily  torsemide  20 mg daily  atorvastatin  40 mg daily  aspirin  81 mg daily amiodarone  200 mg daily  Xarelto  15 mg bid x7 days then 20 mg daily   STOP Spironolactone 

## 2023-06-27 NOTE — ED Notes (Signed)
 Spoke with patient regarding his refusal to allow staff to draw blood work. I emphasized the safety behind drawing heparin  levels and that we would need to discontinue this medication if he continued to refuse blood draw. I spoke with Dr Marsa who offered PO option. I explained, in detail, the options to the patient. He verbalized understanding and agreed to let me obtain additional IV access after he eats breakfast. He then sat up, and started eating his breakfast tray.

## 2023-06-27 NOTE — Progress Notes (Signed)
*  PRELIMINARY RESULTS* Echocardiogram 2D Echocardiogram has been performed.  Jeremy [name] 06/27/2023, 1:19 PM

## 2023-06-27 NOTE — ED Notes (Signed)
 Pt placed in hospital bed to increase comfort. Pt encouraged to leave right arm down for heparin infusion. Pt agreeable. Discussed obtaining secondary IV site to decrease discomfort. Pt declined. CB within reach. Pt encouraged to alert staff to any needs.

## 2023-06-27 NOTE — Progress Notes (Signed)
 PHARMACY - ANTICOAGULATION CONSULT NOTE  Pharmacy Consult for Heparin  Infusion Indication: pulmonary embolus  Allergies  Allergen Reactions   Amoxicillin  Swelling   Keflex  [Cephalexin ] Rash    Patient Measurements: Height: 5' 11 (180.3 cm) Weight: 99.8 kg (220 lb) IBW/kg (Calculated) : 75.3 Heparin  Dosing Weight: 95.8 kg  Vital Signs: Temp: 97.6 F (36.4 C) (01/12 2217) Temp Source: Oral (01/12 2217) BP: 100/77 (01/12 2300) Pulse Rate: 103 (01/12 2300)  Labs: Recent Labs    06/26/23 1151 06/26/23 1347 06/26/23 1353 06/26/23 1708 06/27/23 0021  HGB 15.1  --   --   --   --   HCT 45.4  --   --   --   --   PLT 232  --   --   --   --   APTT  --   --   --  32  --   LABPROT  --   --   --  13.3  --   INR  --   --   --  1.0  --   HEPARINUNFRC  --   --   --   --  0.38  CREATININE 1.14  --   --   --   --   TROPONINIHS  --  15 18*  --   --     Estimated Creatinine Clearance: 76.7 mL/min (by C-G formula based on SCr of 1.14 mg/dL).   Medical History: Past Medical History:  Diagnosis Date   Coronary artery disease    Diabetes mellitus without complication (HCC)    Hypertension    S/P CABG x 1     Medications:  Patient is on rivaroxaban  20 mg daily at home per chart review-- according to patient, patient has been staying at a men's homeless shelter for the past few days and not taking his medications  Assessment: Patient is a 68 year old male with a  past medical history of DVT, Afib with RVR, CAD s/p CABG, CKD 3, DM, and tobacco abuse who reports cough, nasal congestion, and chest discomfort. CT chest showed Segmental level filling defects anteriorly in the left lower lobe and in the right upper lobe pulmonary arteries compatible with pulmonary embolus. The right upper lobe pulmonary embolus has a chronic component although may also have an acute component. Clot burden is small. No lobar or greater size embolus; right ventricular to left ventricular ratio is within  normal limits. Pharmacy has been consulted to initiate patient on a heparin  infusion.  Baseline INR and aPTT ordered.  No signs/symptoms of bleeding noted. Hgb 15.1. PLT 232.  Goal of Therapy:  Heparin  level 0.3-0.7 units/ml Monitor platelets by anticoagulation protocol: Yes  01/13 0021 HL 0.38, therapeutic x 1   Plan:  Continue heparin  infusion at a rate of 1500 units/hr Check heparin  level w/ AM labs to confirm Monitor CBC daily while on heparin   Rankin CANDIE Dills, PharmD, [facility] 06/27/2023 12:47 AM

## 2023-06-27 NOTE — ED Notes (Signed)
 Called CCMD to verify patient is on monitor. Pt is on central monitoring system

## 2023-06-27 NOTE — ED Notes (Signed)
 Attempted to start another IV on pt. Pt c/o pain and pulling his hand away. IV attempt failed. Pt would not allow me to attempt a second time. Pt removed PIV to L hand during his echo earlier in the day.

## 2023-06-27 NOTE — Progress Notes (Signed)
 PROGRESS NOTE    Jeremy [name]   FMW:[id number] DOB: [birth year]  DOA: 06/26/2023 Date of Service: 06/27/23 which is hospital day 0  PCP: Sadie Manna, MD    Hospital course / significant events:   HPI: Jeremy [name] is a 68 y.o. male with medical history significant of CAD/NSTEMI/CABG, HTN, HLD, chronic HFpEf, COPD Gold stage I, PAF on Xarelto , IIDM, medical noncompliance, polysubstance abuse, presented with worsening of cough and palpitations.   01/12: admitted for PE and Afib RVR though rate went down, started heparin  gtt, echo pending 01/13: Echo abn - compared to echo 11/2022: now LVEF 35-40% vs 55-60%, now LV global hypokinesis vs no RWMA, now RV mild/mod reduced systolic fxn vs normal fxn, Both - no significant valvular abn. Cardiology to consult - KC to see tomorrow possibly will need ischemic eval. Continue heparin .     Consultants:  Cardiology   Procedures/Surgeries: none      ASSESSMENT & PLAN:   Acute bilateral segmental PE, on chronic bilateral PE Along with a known PAF, indication for lifelong anticoagulation.  Patient however is known for his noncompliance with medications particularly anticoagulations. Heparin  to continue pending cardio eval  Eventual switch to home Xarelto    PAF  Tachycardia, sinus Likely secondary to PE Heparin  to continue pending cardio eval  Eventual switch to home Xarelto  Increase home metoprolol , amiodarone     Chronic HFpEF --> Echo demonstrates new chronic HFrEF and concern for mild RV strain though likely also chronic could be d/t or complicated by PE  HTN CAD Euvolemic, blood pressure borderline low Hold off spironolactone  and Imdur  today Increase metoprolol  for rate control  continue Entresto   Monitor BP Resume p.o. torsemide  per cardiology / if BP permits  Acute bronchitis/acute COPD exacerbation Atrovent  nebulizing, trial of Pulmicort . Avoid albuterol . Guaifenesin  Incentive spirometry and flutter  valve Monitor off antibiotics   Dysuria UA normal Monitor     IDDM Hold off metformin  as patient received IV contrast  SSI   Medical noncompliance Claimed that he has been homeless, with poor vision, sometimes hard to read the labels on the pill boxes and patient requests home visiting nurse vs other help.  consult case management.       Class 1 obesity based on BMI: Body mass index is 30.68 kg/m.  Underweight - under 18  overweight - 25 to 29 obese - 30 or more Class 1 obesity: BMI of 30.0 to 34 Class 2 obesity: BMI of 35.0 to 39 Class 3 obesity: BMI of 40.0 to 49 Super Morbid Obesity: BMI 50-59 Super-super Morbid Obesity: BMI 60+ Significantly low or high BMI is associated with higher medical risk.  Weight management advised as adjunct to other disease management and risk reduction treatments    DVT prophylaxis: heparin  IV fluids: no continuous IV fluids  Nutrition: cardiac/diabetic Central lines / invasive devices: none  Code Status: FULL CODE ACP documentation reviewed:  none on file in [facility]  TOC needs: med assistance  Barriers to dispo / significant pending items: cardiology eval possible w/u iscehmic heart disease given new echo findings              Subjective / Brief ROS:  Denies CP/SOB.  Confirms hasn't taken meds in awhile d/t vision problems, homelessness Pain controlled.  Denies new weakness.  Tolerating diet.  Reports no concerns w/ urination/defecation.   Family Communication: none at this time     Objective Findings:  Vitals:   06/27/23 1315 06/27/23 1500 06/27/23 1630 06/27/23 1632  BP:  125/75 119/69   Pulse:  93 (!) 127 (!) 106  Resp: 18 17    Temp:      TempSrc:      SpO2: 94% 92% 98%   Weight:      Height:        Intake/Output Summary (Last 24 hours) at 06/27/2023 1703 Last data filed at 06/27/2023 1133 Gross per 24 hour  Intake 255.82 ml  Output 2200 ml  Net -1944.18 ml   Filed Weights   06/26/23 1136   Weight: 99.8 kg    Examination:  Physical Exam Constitutional:      General: He is not in acute distress.    Appearance: He is not ill-appearing.  Cardiovascular:     Rate and Rhythm: Normal rate and regular rhythm.     Heart sounds: Murmur heard.  Pulmonary:     Effort: Pulmonary effort is normal.     Breath sounds: Wheezing present.  Abdominal:     General: Abdomen is flat. Bowel sounds are normal.     Palpations: Abdomen is soft.  Musculoskeletal:     Right lower leg: No edema.     Left lower leg: No edema.  Skin:    General: Skin is warm and dry.  Neurological:     Mental Status: He is alert. Mental status is at baseline.  Psychiatric:        Mood and Affect: Mood normal.          Scheduled Medications:   amiodarone   200 mg Oral Daily   aspirin   81 mg Oral Daily   atorvastatin   40 mg Oral Daily   budesonide  (PULMICORT ) nebulizer solution  0.25 mg Nebulization BID   insulin  aspart  0-15 Units Subcutaneous TID WC   insulin  aspart  0-5 Units Subcutaneous QHS   ipratropium  0.5 mg Nebulization Q6H   metoprolol  tartrate  50 mg Oral BID   pantoprazole   20 mg Oral Daily   sacubitril -valsartan   1 tablet Oral Q12H   sodium chloride  flush  3 mL Intravenous Q12H   torsemide   20 mg Oral Daily    Continuous Infusions:  heparin  1,500 Units/hr (06/27/23 1630)    PRN Medications:  acetaminophen , guaiFENesin , ipratropium, linaclotide , metoprolol  tartrate, ondansetron  (ZOFRAN ) IV, sodium chloride  flush, traMADol   Antimicrobials from admission:  Anti-infectives (From admission, onward)    None           Data Reviewed:  I have personally reviewed the following...  CBC: Recent Labs  Lab 06/26/23 1151 06/27/23 0841  WBC 7.3 17.5*  NEUTROABS 4.5  --   HGB 15.1 14.9  HCT 45.4 44.6  MCV 95.2 94.7  PLT 232 275   Basic Metabolic Panel: Recent Labs  Lab 06/26/23 1151 06/27/23 0841  NA 134* 136  K 4.1 4.5  CL 108 104  CO2 18* 22  GLUCOSE 142* 182*   BUN 18 25*  CREATININE 1.14 1.59*  CALCIUM  8.8* 9.0   GFR: Estimated Creatinine Clearance: 55 mL/min (A) (by C-G formula based on SCr of 1.59 mg/dL (H)). Liver Function Tests: No results for input(s): AST, ALT, ALKPHOS, BILITOT, PROT, ALBUMIN in the last 168 hours. No results for input(s): LIPASE, AMYLASE in the last 168 hours. No results for input(s): AMMONIA in the last 168 hours. Coagulation Profile: Recent Labs  Lab 06/26/23 1708  INR 1.0   Cardiac Enzymes: No results for input(s): CKTOTAL, CKMB, CKMBINDEX, TROPONINI in the last 168 hours. BNP (last 3 results) No results for input(s): PROBNP  in the last 8760 hours. HbA1C: Recent Labs    06/26/23 1151  HGBA1C 6.4*   CBG: Recent Labs  Lab 06/26/23 1713 06/26/23 2144 06/27/23 0845 06/27/23 1143  GLUCAP 83 271* 155* 132*   Lipid Profile: No results for input(s): CHOL, HDL, LDLCALC, TRIG, CHOLHDL, LDLDIRECT in the last 72 hours. Thyroid  Function Tests: No results for input(s): TSH, T4TOTAL, FREET4, T3FREE, THYROIDAB in the last 72 hours. Anemia Panel: No results for input(s): VITAMINB12, FOLATE, FERRITIN, TIBC, IRON, RETICCTPCT in the last 72 hours. Most Recent Urinalysis On File:     Component Value Date/Time   COLORURINE YELLOW (A) 06/26/2023 1631   APPEARANCEUR CLEAR (A) 06/26/2023 1631   LABSPEC 1.023 06/26/2023 1631   PHURINE 5.0 06/26/2023 1631   GLUCOSEU NEGATIVE 06/26/2023 1631   HGBUR NEGATIVE 06/26/2023 1631   BILIRUBINUR NEGATIVE 06/26/2023 1631   KETONESUR NEGATIVE 06/26/2023 1631   PROTEINUR NEGATIVE 06/26/2023 1631   NITRITE NEGATIVE 06/26/2023 1631   LEUKOCYTESUR NEGATIVE 06/26/2023 1631   Sepsis Labs: @LABRCNTIP (procalcitonin:4,lacticidven:4) Microbiology: Recent Results (from the past 240 hours)  Resp panel by RT-PCR (RSV, Flu A&B, Covid) Anterior Nasal Swab     Status: None   Collection Time: 06/26/23 11:51 AM   Specimen:  Anterior Nasal Swab  Result Value Ref Range Status   SARS Coronavirus 2 by RT PCR NEGATIVE NEGATIVE Final    Comment: (NOTE) SARS-CoV-2 target nucleic acids are NOT DETECTED.  The SARS-CoV-2 RNA is generally detectable in upper respiratory specimens during the acute phase of infection. The lowest concentration of SARS-CoV-2 viral copies this assay can detect is 138 copies/mL. A negative result does not preclude SARS-Cov-2 infection and should not be used as the sole basis for treatment or other patient management decisions. A negative result may occur with  improper specimen collection/handling, submission of specimen other than nasopharyngeal swab, presence of viral mutation(s) within the areas targeted by this assay, and inadequate number of viral copies(<138 copies/mL). A negative result must be combined with clinical observations, patient history, and epidemiological information. The expected result is Negative.  Fact Sheet for Patients:  bloggercourse.com  Fact Sheet for Healthcare Providers:  seriousbroker.it  This test is no t yet approved or cleared by the [place]  FDA and  has been authorized for detection and/or diagnosis of SARS-CoV-2 by FDA under an Emergency Use Authorization (EUA). This EUA will remain  in effect (meaning this test can be used) for the duration of the COVID-19 declaration under Section 564(b)(1) of the Act, 21 U.S.C.section 360bbb-3(b)(1), unless the authorization is terminated  or revoked sooner.       Influenza A by PCR NEGATIVE NEGATIVE Final   Influenza B by PCR NEGATIVE NEGATIVE Final    Comment: (NOTE) The Xpert Xpress SARS-CoV-2/FLU/RSV plus assay is intended as an aid in the diagnosis of influenza from Nasopharyngeal swab specimens and should not be used as a sole basis for treatment. Nasal washings and aspirates are unacceptable for Xpert Xpress SARS-CoV-2/FLU/RSV testing.  Fact  Sheet for Patients: bloggercourse.com  Fact Sheet for Healthcare Providers: seriousbroker.it  This test is not yet approved or cleared by the [place]  FDA and has been authorized for detection and/or diagnosis of SARS-CoV-2 by FDA under an Emergency Use Authorization (EUA). This EUA will remain in effect (meaning this test can be used) for the duration of the COVID-19 declaration under Section 564(b)(1) of the Act, 21 U.S.C. section 360bbb-3(b)(1), unless the authorization is terminated or revoked.     Resp Syncytial Virus by PCR  NEGATIVE NEGATIVE Final    Comment: (NOTE) Fact Sheet for Patients: bloggercourse.com  Fact Sheet for Healthcare Providers: seriousbroker.it  This test is not yet approved or cleared by the [place]  FDA and has been authorized for detection and/or diagnosis of SARS-CoV-2 by FDA under an Emergency Use Authorization (EUA). This EUA will remain in effect (meaning this test can be used) for the duration of the COVID-19 declaration under Section 564(b)(1) of the Act, 21 U.S.C. section 360bbb-3(b)(1), unless the authorization is terminated or revoked.  Performed at [facility] [address] [place], [place] [address]       Radiology Studies last 3 days: ECHOCARDIOGRAM COMPLETE Result Date: 06/27/2023    ECHOCARDIOGRAM REPORT   Patient Name:   Jeremy [name] Date of Exam: 06/27/2023 Medical Rec #:  [id number]       Height:       71.0 in Accession #:    [id number]      Weight:       220.0 lb Date of Birth:  1955/10/17      BSA:          2.196 m Patient Age:    67 years        BP:           103/73 mmHg Patient Gender: M               HR:           117 bpm. Exam Location:  [facility] Procedure: 2D Echo, Cardiac Doppler, Color Doppler and Intracardiac            Opacification Agent Indications:     Pulmonary embolus  History:         Patient has  prior history of Echocardiogram examinations, most                  recent 11/24/2022. Previous Myocardial Infarction and CAD, Prior                  CABG, COPD, Arrythmias:Atrial Fibrillation; Risk                  Factors:Hypertension, Sleep Apnea, Diabetes, Dyslipidemia and                  Current Smoker. Polysubstance abuse, Pulmonary embolus.  Sonographer:     Naomie Reef Referring Phys:  [id number] CORT ONEIDA MANA Diagnosing Phys: Keller Paterson  Sonographer Comments: Technically difficult study due to poor echo windows. Image acquisition challenging due to uncooperative patient and Image acquisition challenging due to COPD. IMPRESSIONS  1. Technically difficult study inspite of using Definity .  2. Left ventricular ejection fraction, by estimation, is 35 to 40%. The left ventricle has moderately decreased function. The left ventricle demonstrates global hypokinesis. There is moderate concentric left ventricular hypertrophy. Left ventricular diastolic parameters are indeterminate.  3. Right ventricle incompletely visualized. Grossly appears normal in size with mild to moderately reduced systolic function.  4. The mitral valve is normal in structure. Trivial mitral valve regurgitation.  5. The aortic valve is tricuspid. There is mild calcification of the aortic valve. There is mild thickening of the aortic valve. Aortic valve regurgitation is mild to moderate.  6. The inferior vena cava is normal in size with greater than 50% respiratory variability, suggesting right atrial pressure of 3 mmHg. FINDINGS  Left Ventricle: Left ventricular ejection fraction, by estimation, is 35 to 40%. The left ventricle has moderately decreased function. The left ventricle demonstrates global  hypokinesis. Definity  contrast agent was given IV to delineate the left ventricular endocardial borders. The left ventricular internal cavity size was normal in size. There is moderate concentric left ventricular hypertrophy. Left ventricular  diastolic parameters are indeterminate. Right Ventricle: Right ventricle incompletely visualized. Grossly appears normal in size with mild to moderately reduced systolic function. Right vetricular wall thickness was not well visualized. Left Atrium: Left atrial size was normal in size. Right Atrium: Right atrial size was normal in size. Pericardium: There is no evidence of pericardial effusion. Mitral Valve: The mitral valve is normal in structure. Trivial mitral valve regurgitation. MV peak gradient, 3.0 mmHg. The mean mitral valve gradient is 1.0 mmHg. Tricuspid Valve: The tricuspid valve is normal in structure. Tricuspid valve regurgitation is trivial. Aortic Valve: The aortic valve is tricuspid. There is mild calcification of the aortic valve. There is mild thickening of the aortic valve. Aortic valve regurgitation is mild to moderate. Aortic regurgitation PHT measures 779 msec. Aortic valve mean gradient measures 2.3 mmHg. Aortic valve peak gradient measures 4.9 mmHg. Aortic valve area, by VTI measures 3.23 cm. Pulmonic Valve: The pulmonic valve was not well visualized. Pulmonic valve regurgitation is not visualized. Aorta: The aortic root and ascending aorta are structurally normal, with no evidence of dilitation. Venous: The inferior vena cava is normal in size with greater than 50% respiratory variability, suggesting right atrial pressure of 3 mmHg. IAS/Shunts: The interatrial septum was not well visualized.  LEFT VENTRICLE PLAX 2D LVIDd:         5.70 cm LVIDs:         4.90 cm LV PW:         1.30 cm LV IVS:        1.50 cm LVOT diam:     2.10 cm LV SV:         56 LV SV Index:   25 LVOT Area:     3.46 cm  RIGHT VENTRICLE RV Basal diam:  4.50 cm RV Mid diam:    2.70 cm RV S prime:     9.90 cm/s LEFT ATRIUM             Index        RIGHT ATRIUM           Index LA diam:        4.30 cm 1.96 cm/m   RA Area:     19.70 cm LA Vol (A2C):   71.9 ml 32.75 ml/m  RA Volume:   56.30 ml  25.64 ml/m LA Vol (A4C):   38.4  ml 17.49 ml/m LA Biplane Vol: 52.7 ml 24.00 ml/m  AORTIC VALVE                    PULMONIC VALVE AV Area (Vmax):    3.04 cm     PV Vmax:       0.87 m/s AV Area (Vmean):   3.13 cm     PV Peak grad:  3.0 mmHg AV Area (VTI):     3.23 cm AV Vmax:           110.87 cm/s AV Vmean:          70.767 cm/s AV VTI:            0.173 m AV Peak Grad:      4.9 mmHg AV Mean Grad:      2.3 mmHg LVOT Vmax:         97.40 cm/s LVOT Vmean:  63.900 cm/s LVOT VTI:          0.161 m LVOT/AV VTI ratio: 0.93 AI PHT:            779 msec  AORTA Ao Root diam: 3.60 cm Ao Asc diam:  3.50 cm MITRAL VALVE MV Area (PHT): 4.74 cm    SHUNTS MV Area VTI:   4.13 cm    Systemic VTI:  0.16 m MV Peak grad:  3.0 mmHg    Systemic Diam: 2.10 cm MV Mean grad:  1.0 mmHg MV Vmax:       0.87 m/s MV Vmean:      44.6 cm/s MV Decel Time: 160 msec MV E velocity: 88.00 cm/s Keller Paterson Electronically signed by Keller Paterson Signature Date/Time: 06/27/2023/3:59:56 PM    Final    US  Venous Img Lower Bilateral (DVT) Result Date: 06/26/2023 CLINICAL DATA:  Bilateral pulmonary emboli. EXAM: BILATERAL LOWER EXTREMITY VENOUS DOPPLER ULTRASOUND TECHNIQUE: Gray-scale sonography with compression, as well as color and duplex ultrasound, were performed to evaluate the deep venous system(s) from the level of the common femoral vein through the popliteal and proximal calf veins. COMPARISON:  None Available. FINDINGS: VENOUS Normal compressibility of the common femoral, superficial femoral, and popliteal veins, as well as the visualized calf veins. Visualized portions of profunda femoral vein and great saphenous vein unremarkable. No filling defects to suggest DVT on grayscale or color Doppler imaging. Doppler waveforms show normal direction of venous flow, normal respiratory plasticity and response to augmentation. OTHER Bilateral femoral and popliteal arterial plaque incidentally noted. Limitations: Technologist describes technically difficult study secondary to  uncooperative patient. IMPRESSION: 1. Negative for lower extremity DVT. Electronically Signed   By: JONETTA Faes M.D.   On: 06/26/2023 17:30   CT Angio Chest PE W and/or Wo Contrast Result Date: 06/26/2023 CLINICAL DATA:  Cough and chest pain. EXAM: CT ANGIOGRAPHY CHEST WITH CONTRAST TECHNIQUE: Multidetector CT imaging of the chest was performed using the standard protocol during bolus administration of intravenous contrast. Multiplanar CT image reconstructions and MIPs were obtained to evaluate the vascular anatomy. RADIATION DOSE REDUCTION: This exam was performed according to the departmental dose-optimization program which includes automated exposure control, adjustment of the mA and/or kV according to patient size and/or use of iterative reconstruction technique. CONTRAST:  OMNIPAQUE  IOHEXOL  350 MG/ML SOLN COMPARISON:  Chest radiograph 06/26/2023 and CT scan 12/22/2020 FINDINGS: Cardiovascular: Small filling defect in the anterior segmental branch of the left lower lobe pulmonary artery compatible with pulmonary embolus. Segmental level filling defect posteriorly in the right upper lobe on images 49 through 54 of series 4 observed, a component of this could be chronic given some peripheral location, but superimposed acute embolus is likely. Clot burden is small. Moderate cardiomegaly. Prior CABG. Atherosclerosis is present, including aortoiliac atherosclerotic disease. Right ventricular to ref ventricular ratio calculation does not apply due to the lack of lobar or larger emboli, but is measured the RV: LV ratio is 0.88. Prominence of the main pulmonary artery favoring pulmonary arterial hypertension. Mediastinum/Nodes: Borderline prominent hilar nodal structures without overt pathologic adenopathy. Lungs/Pleura: Airway thickening is present, suggesting bronchitis or reactive airways disease. Mucus is present in the right lower lobe bronchus, and mucous plugging is present both lower lobes. 8 by 4 by 4  mm (volume = 70 mm^3) subpleural nodule posteriorly in the right middle lobe on image 75 series 6, previously 11 by 7 by 4 mm on 12/22/2020. Given reduced size compared to 2.5 years ago, this nodule is  considered benign. Upper Abdomen: Unremarkable Musculoskeletal: Degenerative glenohumeral arthropathy bilaterally. Mild lower thoracic spondylosis. Review of the MIP images confirms the above findings. IMPRESSION: 1. Segmental level filling defects anteriorly in the left lower lobe and in the right upper lobe pulmonary arteries compatible with pulmonary embolus. The right upper lobe pulmonary embolus has a chronic component although may also have an acute component. Clot burden is small. No lobar or greater size embolus; right ventricular to left ventricular ratio is within normal limits. 2. Prominence of the main pulmonary artery favoring pulmonary arterial hypertension. 3. Moderate cardiomegaly. Prior CABG. 4. Airway thickening is present, suggesting bronchitis or reactive airways disease. Mucus is present in the right lower lobe bronchus, and mucous plugging is present in both lower lobes. 5. Degenerative glenohumeral arthropathy bilaterally. Mild lower thoracic spondylosis. 6. Aortic and coronary atherosclerosis. Aortic Atherosclerosis (ICD10-I70.0). Critical Value/emergent results were called by telephone at the time of interpretation on 06/26/2023 at 3:49 pm to provider Dr. Floy, who verbally acknowledged these results. Electronically Signed   By: Ryan Salvage M.D.   On: 06/26/2023 15:49   DG Chest 2 View Result Date: 06/26/2023 CLINICAL DATA:  Cough for 1 week. EXAM: CHEST - 2 VIEW COMPARISON:  04/28/2023 FINDINGS: The heart size and mediastinal contours are within normal limits. Prior CABG again noted. Both lungs are clear. The visualized skeletal structures are unremarkable. IMPRESSION: No active cardiopulmonary disease. Electronically Signed   By: Norleen DELENA Kil M.D.   On: 06/26/2023 12:26        Time spent: 50 min     Corney Knighton, DO [facility] 06/27/2023, 5:03 PM    Dictation software may have been used to generate the above note. Typos may occur and escape review in typed/dictated notes. Please contact Dr Marsa directly for clarity if needed.  Staff may message me via secure chat in Epic  but this may not receive an immediate response,  please page me for urgent matters!  If 7PM-7AM, please contact night coverage [email or web address]

## 2023-06-27 NOTE — Care Management Obs Status (Signed)
 MEDICARE OBSERVATION STATUS NOTIFICATION   Patient Details  Name: Jeremy [name] MRN: [id number] Date of Birth: [birth year]   Medicare Observation Status Notification Given:  Orland Dec, CMA 06/27/2023, 3:36 PM

## 2023-06-27 NOTE — ED Notes (Signed)
 This RN in to obtain morning labs including heparin  level. Pt refuses lab work. Discussed importance of labs especially because he is on a blood thinner and he needs his levels checked. Pt refuses. States they stuck me all damn day and you know what I think, I think your sick, why you gotta take my blood. Attempted to provide education about importance of blood work. Pt continues to refuses. Discussed placign second IV so patient would not continue to get stuck pt refused. Jawo, NP aware

## 2023-06-28 ENCOUNTER — Telehealth (HOSPITAL_COMMUNITY): Payer: Self-pay | Admitting: Pharmacy Technician

## 2023-06-28 ENCOUNTER — Other Ambulatory Visit (HOSPITAL_COMMUNITY): Payer: Self-pay

## 2023-06-28 ENCOUNTER — Other Ambulatory Visit: Payer: Self-pay

## 2023-06-28 DIAGNOSIS — I480 Paroxysmal atrial fibrillation: Secondary | ICD-10-CM | POA: Diagnosis not present

## 2023-06-28 DIAGNOSIS — I2699 Other pulmonary embolism without acute cor pulmonale: Secondary | ICD-10-CM | POA: Diagnosis not present

## 2023-06-28 LAB — CBC
HCT: 43.9 % (ref 39.0–52.0)
Hemoglobin: 14.9 g/dL (ref 13.0–17.0)
MCH: 31.3 pg (ref 26.0–34.0)
MCHC: 33.9 g/dL (ref 30.0–36.0)
MCV: 92.2 fL (ref 80.0–100.0)
Platelets: 274 10*3/uL (ref 150–400)
RBC: 4.76 MIL/uL (ref 4.22–5.81)
RDW: 14.5 % (ref 11.5–15.5)
WBC: 12.3 10*3/uL — ABNORMAL HIGH (ref 4.0–10.5)
nRBC: 0 % (ref 0.0–0.2)

## 2023-06-28 LAB — GLUCOSE, CAPILLARY
Glucose-Capillary: 143 mg/dL — ABNORMAL HIGH (ref 70–99)
Glucose-Capillary: 102 mg/dL — ABNORMAL HIGH (ref 70–99)

## 2023-06-28 LAB — BASIC METABOLIC PANEL
Anion gap: 9 (ref 5–15)
BUN: 35 mg/dL — ABNORMAL HIGH (ref 8–23)
CO2: 22 mmol/L (ref 22–32)
Calcium: 8.9 mg/dL (ref 8.9–10.3)
Chloride: 107 mmol/L (ref 98–111)
Creatinine, Ser: 1.51 mg/dL — ABNORMAL HIGH (ref 0.61–1.24)
GFR, Estimated: 51 mL/min — ABNORMAL LOW (ref >60)
Glucose, Bld: 128 mg/dL — ABNORMAL HIGH (ref 70–99)
Potassium: 3.7 mmol/L (ref 3.5–5.1)
Sodium: 138 mmol/L (ref 135–145)

## 2023-06-28 LAB — HEPARIN LEVEL (UNFRACTIONATED): Heparin Unfractionated: 0.17 [IU]/mL — ABNORMAL LOW (ref 0.30–0.70)

## 2023-06-28 MED ORDER — DOXYCYCLINE HYCLATE 100 MG PO TABS
100.0000 mg | ORAL_TABLET | Freq: Two times a day (BID) | ORAL | 0 refills | Status: DC
  Filled 2023-06-28: qty 14, 7d supply, fill #0

## 2023-06-28 MED ORDER — RIVAROXABAN 20 MG PO TABS
20.0000 mg | ORAL_TABLET | Freq: Every day | ORAL | 0 refills | Status: DC
  Filled 2023-06-28: qty 30, 30d supply, fill #0

## 2023-06-28 MED ORDER — SACUBITRIL-VALSARTAN 24-26 MG PO TABS
1.0000 | ORAL_TABLET | Freq: Two times a day (BID) | ORAL | 0 refills | Status: DC
  Filled 2023-06-28: qty 60, 30d supply, fill #0

## 2023-06-28 MED ORDER — METFORMIN HCL 1000 MG PO TABS
1000.0000 mg | ORAL_TABLET | Freq: Every day | ORAL | 0 refills | Status: DC
  Filled 2023-06-28: qty 30, 30d supply, fill #0

## 2023-06-28 MED ORDER — ATORVASTATIN CALCIUM 40 MG PO TABS
40.0000 mg | ORAL_TABLET | Freq: Every day | ORAL | 0 refills | Status: DC
  Filled 2023-06-28: qty 30, 30d supply, fill #0

## 2023-06-28 MED ORDER — RIVAROXABAN 20 MG PO TABS: 20.0000 mg | ORAL_TABLET | Freq: Every day | ORAL | Status: DC

## 2023-06-28 MED ORDER — CARVEDILOL 6.25 MG PO TABS
6.2500 mg | ORAL_TABLET | Freq: Two times a day (BID) | ORAL | 0 refills | Status: DC
  Filled 2023-06-28 (×2): qty 60, 30d supply, fill #0

## 2023-06-28 MED ORDER — CARVEDILOL 6.25 MG PO TABS: 6.2500 mg | ORAL_TABLET | Freq: Two times a day (BID) | ORAL | Status: DC

## 2023-06-28 MED ORDER — RIVAROXABAN 15 MG PO TABS
15.0000 mg | ORAL_TABLET | Freq: Two times a day (BID) | ORAL | Status: DC
  Administered 2023-06-28: 15 mg via ORAL
  Filled 2023-06-28 (×2): qty 1

## 2023-06-28 MED ORDER — RIVAROXABAN (XARELTO) VTE STARTER PACK (15 & 20 MG)
15.0000 mg | ORAL_TABLET | Freq: Two times a day (BID) | ORAL | 0 refills | Status: DC
  Filled 2023-06-28: qty 42, 21d supply, fill #0
  Filled 2023-06-28: qty 51, 30d supply, fill #0

## 2023-06-28 MED ORDER — AMIODARONE HCL 200 MG PO TABS
200.0000 mg | ORAL_TABLET | Freq: Every day | ORAL | 0 refills | Status: DC
  Filled 2023-06-28: qty 30, 30d supply, fill #0

## 2023-06-28 MED ORDER — TORSEMIDE 20 MG PO TABS
20.0000 mg | ORAL_TABLET | Freq: Every day | ORAL | 0 refills | Status: DC
  Filled 2023-06-28 (×2): qty 30, 30d supply, fill #0

## 2023-06-28 MED ORDER — PANTOPRAZOLE SODIUM 20 MG PO TBEC
20.0000 mg | DELAYED_RELEASE_TABLET | Freq: Every day | ORAL | 0 refills | Status: DC
  Filled 2023-06-28: qty 30, 30d supply, fill #0

## 2023-06-28 MED ORDER — ASPIRIN 81 MG PO CHEW
81.0000 mg | CHEWABLE_TABLET | Freq: Every day | ORAL | 0 refills | Status: DC
  Filled 2023-06-28: qty 36, 36d supply, fill #0

## 2023-06-28 MED ORDER — RIVAROXABAN 20 MG PO TABS
20.0000 mg | ORAL_TABLET | Freq: Every day | ORAL | Status: DC
Start: 2023-07-19 — End: 2023-06-28

## 2023-06-28 MED ORDER — HEPARIN BOLUS VIA INFUSION
2900.0000 [IU] | Freq: Once | INTRAVENOUS | Status: AC
  Administered 2023-06-28: 2900 [IU] via INTRAVENOUS
  Filled 2023-06-28: qty 2900

## 2023-06-28 NOTE — Discharge Instructions (Signed)
 Rent/Utility/Housing  Agency Name: [facility] Agency Address: [address] [place], [place] [address] Phone: [phone] Email: [email or web address] Website: [email or web address] Service(s) Offered: Housing services, self-sufficiency, congregate meal program, weatherization program, Field seismologist program, emergency food assistance,  housing counseling, home ownership program, wheels -towork program.  Agency Name: [facility] Address: [address] [place], [place] [address] Phone: [phone] (8a-4p) [phone] (8p- 10p) Email: [email or web address] Website: [email or web address] Service(s) Offered: A program for homeless and/or needy men that includes one-on-one counseling, life skills training and job rehabilitation.  Agency Name: [facility] Address: [address] [place], [place] [address] Phone: [phone] Website: [email or web address] Service(s) Offered: Assistance to needy in emergency with utility bills, heating fuel, and prescriptions. Shelter for homeless 7pm-7am. October 07, 2016 15  Agency Name: [facility] [place] [facility] Address: [address], [place], [place] [address] Phone: [phone] Contact Person: Cathleen Corti Email: [email or web address] Website: [email or web address] Service(s) Offered: Helps individuals with developmental disabilities move from housing that is more restrictive to homes where they  can achieve greater independence and have more  opportunities.  Agency Name: [facility] Address: [address], [place], [place] [address] Phone: [phone] Email: [email or web address] Website: [email or web address].org Service(s) Offered: Provides affordable housing for low-income families, elderly, and disabled individuals. Offer a wide range of  programs and services, from financial planning to  afterschool and summer programs.  Agency Name: [facility] Address: [address] [name], [place] [address] Phone: [phone] Service(s) Offered: Child support services; child welfare services; food stamps; Medicaid; work first family assistance; and aid with fuel,  rent, food and medicine.  Agency Name: [facility] [place] [employer] Address: [facility] [address], [place], [place]  [address] Phone: [phone] Website: www.familyabuseservices.org Service(s) Offered: 24 hour Crisis Line: [phone]; 24 hour Emergency Shelter; Transitional Housing; Support Groups; Scientist, physiological; [employer]; Hispanic Outreach: [phone];  Visitation Center: [phone].  Agency Name: [facility] [place]. Address: [address] [place], [place] [address] Phone: [phone] Service(s) Offered: CAP Services; Home and [employer]; Individual or Group Supports; Respite Care Non-Institutional Nursing;  Residential Supports; Respite Care and Personal Care Services; Transportation; Family and Friends Night; Recreational Activities; Three Nutritious Meals/Snacks; Consultation with Registered Dietician; Twenty-four hour Registered Nurse Access; Daily and [employer]; [employer]; [place] for the [employer] (During Summer Months) Bingo Night (Every  Wednesday Night); Special Populations Dance Night  (Every Tuesday Night); Professional Hair Care Services.  Agency Name: [facility] Address: [address], [place], [place] [address] Phone: [phone] Contact Person: Jabier Mutton Website: [email or web address].Physicist, medical) Offered: Residential treatment facility for women; food and  clothing, educational & employment development and  transportation to work; Counsellor of financial skills;  parenting and family reunification; emotional and spiritual  support;  transitional housing for program graduates.  Agency Name: [facility] Address: [address] [place], [place] [address] Phone: [phone] Email: [email or web address] Website: [email or web address].es Service(s) Offered: Public housing units for elderly, disabled, and low income people; housing choice vouchers for income eligible  applicants; shelter plus care vouchers; and Psychologist, clinical.  Agency Name: [facility] [employer] Address: [address] [place], [place] [address] Phone: [phone] Email: [email or web address] Website: www.habitatalamance.org Service(s) Offered: Build houses for families in need of decent housing. Each adult in the family must invest 200 hours of labor on  someone else's house, work with volunteers to build their own house, attend classes  on budgeting, home maintenance, yard care, and attend homeowner association meetings.  Agency Name: [facility] Address: [address], [place], [place] [address] Phone: [phone] Website: www.rsli.org Service(s) Offered: Intermediate care facilities for intellectually delayed, Supervised Living in group homes for adults with developmental disabilities, Supervised Living for people who have dual diagnoses (MRMI), Independent Living, Supported Living, respite and a variety of CAP services, pre-vocational services, day supports, and [employer].  Agency Name: [employer] Phone: [phone] Website: www.[place]CForeclosurePrevention.gov Service(s) Offered: Zero-interest, deferred loans to homeowners struggling to pay their mortgage. Call for more information.

## 2023-06-28 NOTE — Plan of Care (Signed)

## 2023-06-28 NOTE — Consult Note (Addendum)
 [facility] CLINIC CARDIOLOGY CONSULT NOTE       Patient ID: Jeremy [name] MRN: [id number] DOB/AGE: 1956-05-27 68 y.o.  Admit date: 06/26/2023 Referring Physician Dr. Laneta Blunt Primary Physician Sadie Manna, MD  Primary Cardiologist Dr. Cara Lovelace Reason for Consultation HFrEF  HPI: Jeremy [name] is a 68 y.o. male  with a past medical history of CAD s/p CABG x 2 (LAD & OM1 05/30/2012 at [facility]), HFrEF (35%, mild AI 06/10/2021), paroxysmal atrial fibrillation (xarelto ), HTN, DM2, CKD III, OSA on CPAP, hx PE s/p mech thrombectomy (12/2020), ongoing tobacco use, hx crack cocaine use who presented to the ED on 06/26/2023 for cough and congestion.  Found to have PE on CTA.  Echo demonstrated reduced EF.  Cardiology was consulted for further evaluation.   Patient states that prior to admission he was experiencing cough and congestion.  States that he also had associated shortness of breath.  His symptoms were gradually worsening and he decided to come to the ED for further evaluation.  Workup in the ED notable for creatinine 1.14, potassium 4.1, sodium 134, hemoglobin 15.1, WBC 7.3.  Troponins trended 15 > 18.  EKG demonstrated atrial flutter with a rate of 125 bpm.  Without evidence of acute disease.  CTA chest demonstrated segmental filling defects consistent with PEs in left lower lobe and right upper lobe started on IV heparin  in the ED.  At the time of my evaluation this morning patient is resting comfortably in hospital bed.  States that his cough has slightly improved but this is still present.  He denies any episodes of chest pain or anginal symptoms.  States that he has not had these while admitted or prior to coming into the hospital.  Has a known history of atrial fibrillation.  There is some question of medication noncompliance.  We discussed his reduced heart function that was noted on echo, this is not a new finding for him.  Echo from our office in 2022 revealed an EF of  35%.  He denies any episodes of worsening swelling.  Review of systems complete and found to be negative unless listed above    Past Medical History:  Diagnosis Date   Coronary artery disease    Diabetes mellitus without complication (HCC)    Hypertension    S/P CABG x 1     Past Surgical History:  Procedure Laterality Date   ANKLE ARTHROSCOPY W/ OPEN REPAIR Right    CORONARY ARTERY BYPASS GRAFT     PULMONARY THROMBECTOMY Bilateral 12/24/2020   Procedure: PULMONARY THROMBECTOMY;  Surgeon: Marea Selinda RAMAN, MD;  Location: [facility] INVASIVE CV LAB;  Service: Cardiovascular;  Laterality: Bilateral;    Medications Prior to Admission  Medication Sig Dispense Refill Last Dose/Taking   acetaminophen  (TYLENOL ) 500 MG tablet Take 2 tablets (1,000 mg total) by mouth every 8 (eight) hours as needed for moderate pain. 60 tablet 0 Taking As Needed   amiodarone  (PACERONE ) 200 MG tablet Take 1 tablet (200 mg total) by mouth daily. 90 tablet 3 Past Week   carvedilol  (COREG ) 6.25 MG tablet Take 1 tablet (6.25 mg total) by mouth 2 (two) times daily with meals. 180 tablet 3 Past Week   furosemide  (LASIX ) 20 MG tablet Take 1 tablet (20 mg total) by mouth daily as needed for edema. 30 tablet 0 Past Week   metoprolol  succinate (TOPROL -XL) 25 MG 24 hr tablet Take 1 tablet (25 mg total) by mouth daily for blood pressure 90 tablet 1 Past Week   nitroGLYCERIN  (  NITROSTAT ) 0.4 MG SL tablet Place 1 tablet (0.4 mg total) under the tongue every 5 (five) minutes as needed for chest pain. 30 tablet 0 Taking As Needed   rivaroxaban  (XARELTO ) 20 MG TABS tablet Take 1 tablet (20 mg total) by mouth daily at 5 pm 90 tablet 1 Past Week   rivaroxaban  (XARELTO ) 20 MG TABS tablet Take 1 tablet (20 mg total) by mouth daily. 90 tablet 3 Past Week   sacubitril -valsartan  (ENTRESTO ) 24-26 MG Take 1 tablet by mouth every 12 (twelve) hours. 180 tablet 3 Past Week   spironolactone  (ALDACTONE ) 25 MG tablet Take 0.5 tablets (12.5 mg total) by  mouth daily. 45 tablet 3 Past Week   torsemide  (DEMADEX ) 20 MG tablet Take 1 tablet (20 mg total) by mouth daily. 90 tablet 3 Past Week   traMADol  (ULTRAM ) 50 MG tablet Take 1 tablet (50 mg total) by mouth daily as needed for pain. 30 tablet 1 Past Week   aspirin  81 MG chewable tablet Chew 1 tablet (81 mg total) by mouth daily. 30 tablet 0    atorvastatin  (LIPITOR) 40 MG tablet Take 1 tablet (40 mg total) by mouth daily. (Patient not taking: Reported on 06/26/2023) 30 tablet 0 Not Taking   cephALEXin  (KEFLEX ) 500 MG capsule Take 1 capsule (500 mg total) by mouth 3 (three) times daily for 7 days (Patient not taking: Reported on 06/26/2023) 21 capsule 0 Not Taking   doxycycline  (MONODOX ) 100 MG capsule Take 1 capsule (100 mg total) by mouth 2 (two) times daily for cellulitis and right ankle (Patient not taking: Reported on 06/26/2023) 20 capsule 0 Not Taking   doxycycline  (VIBRA -TABS) 100 MG tablet Take 1 tablet (100 mg total) by mouth 2 (two) times daily for 7 days. (Patient not taking: Reported on 06/26/2023) 14 tablet 0 Not Taking   doxycycline  (VIBRAMYCIN ) 100 MG capsule Take 1 capsule (100 mg total) by mouth 2 (two) times daily for 10 days. (Patient not taking: Reported on 06/26/2023) 20 capsule 0 Not Taking   isosorbide  mononitrate (IMDUR ) 30 MG 24 hr tablet Take 1 tablet (30 mg total) by mouth 2 (two) times daily for Maintenance (Patient not taking: Reported on 06/26/2023) 14 tablet 0 Not Taking   LINZESS  72 MCG capsule Take 72 mcg by mouth daily as needed. (Patient not taking: Reported on 06/26/2023)   Not Taking   losartan  (COZAAR ) 50 MG tablet Take 1 tablet (50 mg total) by mouth daily for hypertension (Patient not taking: Reported on 06/26/2023) 90 tablet 1 Not Taking   metFORMIN  (GLUCOPHAGE ) 1000 MG tablet Take 1 tablet (1,000 mg total) by mouth once daily with breakfast. (Patient not taking: Reported on 06/26/2023) 30 tablet 0 Not Taking   metFORMIN  (GLUCOPHAGE ) 1000 MG tablet Take 1 tablet (1,000 mg  total) by mouth 2 (two) times daily for diabetes (Patient not taking: Reported on 06/26/2023) 28 tablet 0 Not Taking   mupirocin  ointment (BACTROBAN ) 2 % Apply 1 Application topically daily. (Patient not taking: Reported on 06/26/2023) 22 g 0 Not Taking   pantoprazole  (PROTONIX ) 20 MG tablet Take 1 tablet (20 mg total) by mouth daily for GERD (Patient not taking: Reported on 06/26/2023) 14 tablet 0 Not Taking   tadalafil  (CIALIS ) 20 MG tablet Take 1 tablet (20 mg total) by mouth daily as needed for erectile dysfunction (Patient not taking: Reported on 06/26/2023) 10 tablet 2 Not Taking   tadalafil  (CIALIS ) 20 MG tablet Take 1 tablet (20 mg total) by mouth daily as needed for Erectile  Dysfunction (Patient not taking: Reported on 06/26/2023) 30 tablet 3 Not Taking   Social History   Socioeconomic History   Marital status: Married    Spouse name: Not on file   Number of children: Not on file   Years of education: Not on file   Highest education level: Not on file  Occupational History   Not on file  Tobacco Use   Smoking status: Every Day    Current packs/day: 0.50    Types: Cigarettes   Smokeless tobacco: Never  Vaping Use   Vaping status: Never Used  Substance and Sexual Activity   Alcohol  use: Yes    Alcohol /week: 3.0 standard drinks of alcohol     Types: 3 Glasses of wine per week   Drug use: Not Currently    Types: Marijuana    Comment: last use May 2022   Sexual activity: Not Currently  Other Topics Concern   Not on file  Social History Narrative   ** Merged History Encounter **       Social Drivers of Health   Financial Resource Strain: Low Risk  (06/03/2023)   Received from [facility]   Overall Financial Resource Strain (CARDIA)    Difficulty of Paying Living Expenses: Not hard at all  Recent Concern: Financial Resource Strain - High Risk (03/22/2023)   Received from [facility]   Overall Financial Resource Strain (CARDIA)    Difficulty  of Paying Living Expenses: Hard  Food Insecurity: No Food Insecurity (06/03/2023)   Received from [facility]   Hunger Vital Sign    Worried About Running Out of Food in the Last Year: Never true    Ran Out of Food in the Last Year: Never true  Recent Concern: Food Insecurity - Food Insecurity Present (03/22/2023)   Received from [facility]   Hunger Vital Sign    Worried About Running Out of Food in the Last Year: Often true    Ran Out of Food in the Last Year: Often true  Transportation Needs: No Transportation Needs (06/03/2023)   Received from [facility] - Transportation    In the past 12 months, has lack of transportation kept you from medical appointments or from getting medications?: No    Lack of Transportation (Non-Medical): No  Recent Concern: Transportation Needs - Unmet Transportation Needs (03/22/2023)   Received from [facility] - Transportation    In the past 12 months, has lack of transportation kept you from medical appointments or from getting medications?: Yes    Lack of Transportation (Non-Medical): Yes  Physical Activity: Not on file  Stress: Not on file  Social Connections: Not on file  Intimate Partner Violence: Not on file    Family History  Problem Relation Age of Onset   Heart attack Father      Vitals:   06/27/23 2323 06/28/23 0500 06/28/23 0526 06/28/23 0808  BP: 104/75  (!) 100/40 95/68  Pulse: 68  82 72  Resp: 18  20 18   Temp: 97.9 F (36.6 C)  98.7 F (37.1 C) 98.1 F (36.7 C)  TempSrc:    Oral  SpO2: 98%  97% 97%  Weight:  100 kg    Height:        PHYSICAL EXAM General: Chronically ill-appearing male, well nourished, in no acute distress. HEENT: Normocephalic and atraumatic. Neck: No JVD.  Lungs: Normal respiratory effort on room  air. Clear bilaterally to auscultation. No wheezes, crackles, rhonchi.  Heart: Irregularly irregular, controlled  rate. Normal S1 and S2 without gallops or murmurs.  Abdomen: Non-distended appearing.  Msk: Normal strength and tone for age. Extremities: Warm and well perfused. No clubbing, cyanosis.  No edema.  Neuro: Alert and oriented X 3. Psych: Answers questions appropriately.   Labs: Basic Metabolic Panel: Recent Labs    06/27/23 0841 06/28/23 0529  NA 136 138  K 4.5 3.7  CL 104 107  CO2 22 22  GLUCOSE 182* 128*  BUN 25* 35*  CREATININE 1.59* 1.51*  CALCIUM  9.0 8.9   Liver Function Tests: No results for input(s): AST, ALT, ALKPHOS, BILITOT, PROT, ALBUMIN in the last 72 hours. No results for input(s): LIPASE, AMYLASE in the last 72 hours. CBC: Recent Labs    06/26/23 1151 06/27/23 0841 06/28/23 0529  WBC 7.3 17.5* 12.3*  NEUTROABS 4.5  --   --   HGB 15.1 14.9 14.9  HCT 45.4 44.6 43.9  MCV 95.2 94.7 92.2  PLT 232 275 274   Cardiac Enzymes: Recent Labs    06/26/23 1347 06/26/23 1353  TROPONINIHS 15 18*   BNP: No results for input(s): BNP in the last 72 hours. D-Dimer: No results for input(s): DDIMER in the last 72 hours. Hemoglobin A1C: Recent Labs    06/26/23 1151  HGBA1C 6.4*   Fasting Lipid Panel: No results for input(s): CHOL, HDL, LDLCALC, TRIG, CHOLHDL, LDLDIRECT in the last 72 hours. Thyroid  Function Tests: No results for input(s): TSH, T4TOTAL, T3FREE, THYROIDAB in the last 72 hours.  Invalid input(s): FREET3 Anemia Panel: No results for input(s): VITAMINB12, FOLATE, FERRITIN, TIBC, IRON, RETICCTPCT in the last 72 hours.   Radiology: ECHOCARDIOGRAM COMPLETE Result Date: 06/27/2023    ECHOCARDIOGRAM REPORT   Patient Name:   Jeremy [name] Date of Exam: 06/27/2023 Medical Rec #:  [id number]       Height:       71.0 in Accession #:    [id number]      Weight:       220.0 lb Date of Birth:  1956-05-03      BSA:          2.196 m Patient Age:    67 years        BP:           103/73 mmHg Patient Gender: M                HR:           117 bpm. Exam Location:  [facility] Procedure: 2D Echo, Cardiac Doppler, Color Doppler and Intracardiac            Opacification Agent Indications:     Pulmonary embolus  History:         Patient has prior history of Echocardiogram examinations, most                  recent 11/24/2022. Previous Myocardial Infarction and CAD, Prior                  CABG, COPD, Arrythmias:Atrial Fibrillation; Risk                  Factors:Hypertension, Sleep Apnea, Diabetes, Dyslipidemia and                  Current Smoker. Polysubstance abuse, Pulmonary embolus.  Sonographer:     Naomie Reef Referring Phys:  [id number] PING T ZHANG Diagnosing Phys: Keller Paterson  Sonographer Comments: Technically difficult study due to poor echo windows. Image acquisition challenging due to uncooperative patient and Image acquisition challenging due to COPD. IMPRESSIONS  1. Technically difficult study inspite of using Definity .  2. Left ventricular ejection fraction, by estimation, is 35 to 40%. The left ventricle has moderately decreased function. The left ventricle demonstrates global hypokinesis. There is moderate concentric left ventricular hypertrophy. Left ventricular diastolic parameters are indeterminate.  3. Right ventricle incompletely visualized. Grossly appears normal in size with mild to moderately reduced systolic function.  4. The mitral valve is normal in structure. Trivial mitral valve regurgitation.  5. The aortic valve is tricuspid. There is mild calcification of the aortic valve. There is mild thickening of the aortic valve. Aortic valve regurgitation is mild to moderate.  6. The inferior vena cava is normal in size with greater than 50% respiratory variability, suggesting right atrial pressure of 3 mmHg. FINDINGS  Left Ventricle: Left ventricular ejection fraction, by estimation, is 35 to 40%. The left ventricle has moderately decreased function. The left ventricle demonstrates global hypokinesis. Definity   contrast agent was given IV to delineate the left ventricular endocardial borders. The left ventricular internal cavity size was normal in size. There is moderate concentric left ventricular hypertrophy. Left ventricular diastolic parameters are indeterminate. Right Ventricle: Right ventricle incompletely visualized. Grossly appears normal in size with mild to moderately reduced systolic function. Right vetricular wall thickness was not well visualized. Left Atrium: Left atrial size was normal in size. Right Atrium: Right atrial size was normal in size. Pericardium: There is no evidence of pericardial effusion. Mitral Valve: The mitral valve is normal in structure. Trivial mitral valve regurgitation. MV peak gradient, 3.0 mmHg. The mean mitral valve gradient is 1.0 mmHg. Tricuspid Valve: The tricuspid valve is normal in structure. Tricuspid valve regurgitation is trivial. Aortic Valve: The aortic valve is tricuspid. There is mild calcification of the aortic valve. There is mild thickening of the aortic valve. Aortic valve regurgitation is mild to moderate. Aortic regurgitation PHT measures 779 msec. Aortic valve mean gradient measures 2.3 mmHg. Aortic valve peak gradient measures 4.9 mmHg. Aortic valve area, by VTI measures 3.23 cm. Pulmonic Valve: The pulmonic valve was not well visualized. Pulmonic valve regurgitation is not visualized. Aorta: The aortic root and ascending aorta are structurally normal, with no evidence of dilitation. Venous: The inferior vena cava is normal in size with greater than 50% respiratory variability, suggesting right atrial pressure of 3 mmHg. IAS/Shunts: The interatrial septum was not well visualized.  LEFT VENTRICLE PLAX 2D LVIDd:         5.70 cm LVIDs:         4.90 cm LV PW:         1.30 cm LV IVS:        1.50 cm LVOT diam:     2.10 cm LV SV:         56 LV SV Index:   25 LVOT Area:     3.46 cm  RIGHT VENTRICLE RV Basal diam:  4.50 cm RV Mid diam:    2.70 cm RV S prime:     9.90  cm/s LEFT ATRIUM             Index        RIGHT ATRIUM           Index LA diam:        4.30 cm 1.96 cm/m   RA Area:     19.70 cm LA Vol (  A2C):   71.9 ml 32.75 ml/m  RA Volume:   56.30 ml  25.64 ml/m LA Vol (A4C):   38.4 ml 17.49 ml/m LA Biplane Vol: 52.7 ml 24.00 ml/m  AORTIC VALVE                    PULMONIC VALVE AV Area (Vmax):    3.04 cm     PV Vmax:       0.87 m/s AV Area (Vmean):   3.13 cm     PV Peak grad:  3.0 mmHg AV Area (VTI):     3.23 cm AV Vmax:           110.87 cm/s AV Vmean:          70.767 cm/s AV VTI:            0.173 m AV Peak Grad:      4.9 mmHg AV Mean Grad:      2.3 mmHg LVOT Vmax:         97.40 cm/s LVOT Vmean:        63.900 cm/s LVOT VTI:          0.161 m LVOT/AV VTI ratio: 0.93 AI PHT:            779 msec  AORTA Ao Root diam: 3.60 cm Ao Asc diam:  3.50 cm MITRAL VALVE MV Area (PHT): 4.74 cm    SHUNTS MV Area VTI:   4.13 cm    Systemic VTI:  0.16 m MV Peak grad:  3.0 mmHg    Systemic Diam: 2.10 cm MV Mean grad:  1.0 mmHg MV Vmax:       0.87 m/s MV Vmean:      44.6 cm/s MV Decel Time: 160 msec MV E velocity: 88.00 cm/s Keller Paterson Electronically signed by Keller Paterson Signature Date/Time: 06/27/2023/3:59:56 PM    Final    US  Venous Img Lower Bilateral (DVT) Result Date: 06/26/2023 CLINICAL DATA:  Bilateral pulmonary emboli. EXAM: BILATERAL LOWER EXTREMITY VENOUS DOPPLER ULTRASOUND TECHNIQUE: Gray-scale sonography with compression, as well as color and duplex ultrasound, were performed to evaluate the deep venous system(s) from the level of the common femoral vein through the popliteal and proximal calf veins. COMPARISON:  None Available. FINDINGS: VENOUS Normal compressibility of the common femoral, superficial femoral, and popliteal veins, as well as the visualized calf veins. Visualized portions of profunda femoral vein and great saphenous vein unremarkable. No filling defects to suggest DVT on grayscale or color Doppler imaging. Doppler waveforms show normal direction of  venous flow, normal respiratory plasticity and response to augmentation. OTHER Bilateral femoral and popliteal arterial plaque incidentally noted. Limitations: Technologist describes technically difficult study secondary to uncooperative patient. IMPRESSION: 1. Negative for lower extremity DVT. Electronically Signed   By: JONETTA Faes M.D.   On: 06/26/2023 17:30   CT Angio Chest PE W and/or Wo Contrast Result Date: 06/26/2023 CLINICAL DATA:  Cough and chest pain. EXAM: CT ANGIOGRAPHY CHEST WITH CONTRAST TECHNIQUE: Multidetector CT imaging of the chest was performed using the standard protocol during bolus administration of intravenous contrast. Multiplanar CT image reconstructions and MIPs were obtained to evaluate the vascular anatomy. RADIATION DOSE REDUCTION: This exam was performed according to the departmental dose-optimization program which includes automated exposure control, adjustment of the mA and/or kV according to patient size and/or use of iterative reconstruction technique. CONTRAST:  OMNIPAQUE  IOHEXOL  350 MG/ML SOLN COMPARISON:  Chest radiograph 06/26/2023 and CT scan 12/22/2020 FINDINGS: Cardiovascular: Small filling  defect in the anterior segmental branch of the left lower lobe pulmonary artery compatible with pulmonary embolus. Segmental level filling defect posteriorly in the right upper lobe on images 49 through 54 of series 4 observed, a component of this could be chronic given some peripheral location, but superimposed acute embolus is likely. Clot burden is small. Moderate cardiomegaly. Prior CABG. Atherosclerosis is present, including aortoiliac atherosclerotic disease. Right ventricular to ref ventricular ratio calculation does not apply due to the lack of lobar or larger emboli, but is measured the RV: LV ratio is 0.88. Prominence of the main pulmonary artery favoring pulmonary arterial hypertension. Mediastinum/Nodes: Borderline prominent hilar nodal structures without overt  pathologic adenopathy. Lungs/Pleura: Airway thickening is present, suggesting bronchitis or reactive airways disease. Mucus is present in the right lower lobe bronchus, and mucous plugging is present both lower lobes. 8 by 4 by 4 mm (volume = 70 mm^3) subpleural nodule posteriorly in the right middle lobe on image 75 series 6, previously 11 by 7 by 4 mm on 12/22/2020. Given reduced size compared to 2.5 years ago, this nodule is considered benign. Upper Abdomen: Unremarkable Musculoskeletal: Degenerative glenohumeral arthropathy bilaterally. Mild lower thoracic spondylosis. Review of the MIP images confirms the above findings. IMPRESSION: 1. Segmental level filling defects anteriorly in the left lower lobe and in the right upper lobe pulmonary arteries compatible with pulmonary embolus. The right upper lobe pulmonary embolus has a chronic component although may also have an acute component. Clot burden is small. No lobar or greater size embolus; right ventricular to left ventricular ratio is within normal limits. 2. Prominence of the main pulmonary artery favoring pulmonary arterial hypertension. 3. Moderate cardiomegaly. Prior CABG. 4. Airway thickening is present, suggesting bronchitis or reactive airways disease. Mucus is present in the right lower lobe bronchus, and mucous plugging is present in both lower lobes. 5. Degenerative glenohumeral arthropathy bilaterally. Mild lower thoracic spondylosis. 6. Aortic and coronary atherosclerosis. Aortic Atherosclerosis (ICD10-I70.0). Critical Value/emergent results were called by telephone at the time of interpretation on 06/26/2023 at 3:49 pm to provider Dr. Floy, who verbally acknowledged these results. Electronically Signed   By: Ryan Salvage M.D.   On: 06/26/2023 15:49   DG Chest 2 View Result Date: 06/26/2023 CLINICAL DATA:  Cough for 1 week. EXAM: CHEST - 2 VIEW COMPARISON:  04/28/2023 FINDINGS: The heart size and mediastinal contours are within normal  limits. Prior CABG again noted. Both lungs are clear. The visualized skeletal structures are unremarkable. IMPRESSION: No active cardiopulmonary disease. Electronically Signed   By: Norleen DELENA Kil M.D.   On: 06/26/2023 12:26    ECHO as above  TELEMETRY reviewed by me 06/28/2023: Atrial fibrillation rate 60s  EKG reviewed by me: A flutter RVR rate 125 bpm  Data reviewed by me 06/28/2023: last 24h vitals tele labs imaging I/O ED provider note, admission H&P  Principal Problem:   Afib (HCC) Active Problems:   Acute pulmonary embolism (HCC)   Atrial fibrillation with RVR (HCC)   COPD with acute exacerbation (HCC)    ASSESSMENT AND PLAN:  Bryston Colocho is a 68 y.o. male  with a past medical history of CAD s/p CABG x 2 (LAD & OM1 05/30/2012 at [facility]), HFrEF (35%, mild AI 06/10/2021), paroxysmal atrial fibrillation (xarelto ), HTN, DM2, CKD III, OSA on CPAP, hx PE s/p mech thrombectomy (12/2020), ongoing tobacco use, hx crack cocaine use who presented to the ED on 06/26/2023 for cough and congestion.  Found to have PE on CTA.  Echo demonstrated reduced  EF.  Cardiology was consulted for further evaluation.   # Acute PE Patient presented with shortness of breath and cough, found to have acute segmental PE on CTA chest. -Further management per primary team.  # Chronic HFrEF # CAD s/p CABG x2 in 2013  Patient with a history of coronary artery disease s/p CABG x 2 and chronic HFrEF which was improved on last echo.  Echo this admission with EF 35-40%, global hypokinesis. -Suspect EF reduced in the setting of A-fib/flutter RVR.  Right now improved and he is without symptoms of palpitations or heart failure.  See plan for GDMT below. -Continue Entresto  24-26 mg twice daily and carvedilol  as below.  Home spironolactone  had been held due to borderline BP.  Will plan to resume outpatient as BP allows. -Continue torsemide  20 mg daily. -Continue atorvastatin  40 mg daily and aspirin  81 mg daily. -No plan for  further cardiac diagnostics as EF reduction is not necessarily new.  # Persistent atrial fibrillation/flutter Patient with history of paroxysmal atrial fibrillation presenting in atrial flutter with a rate in the 120s.  Similar to prior EKGs.  Rate now improved. -Resume anticoagulation prior to discharge. -Switch back to home carvedilol  6.25 mg twice daily for rate control.  Continue amiodarone  200 mg daily.  Ok for discharge today from a cardiac perspective. Will arrange for follow up in clinic with Dr. Florencio in 1-2 weeks.    This patient's plan of care was discussed and created with Dr. Wilburn and he is in agreement.  Signed: Danita Bloch, PA-C  06/28/2023, 9:23 AM [facility]

## 2023-06-28 NOTE — Progress Notes (Signed)
 PHARMACY - ANTICOAGULATION CONSULT NOTE  Pharmacy Consult for Heparin  Infusion Indication: pulmonary embolus  Allergies  Allergen Reactions   Amoxicillin  Swelling   Keflex  [Cephalexin ] Rash    Patient Measurements: Height: 5' 11 (180.3 cm) Weight: 100 kg (220 lb 7.4 oz) IBW/kg (Calculated) : 75.3 Heparin  Dosing Weight: 95.8 kg  Vital Signs: Temp: 98.7 F (37.1 C) (01/14 0526) BP: 100/40 (01/14 0526) Pulse Rate: 82 (01/14 0526)  Labs: Recent Labs    06/26/23 1151 06/26/23 1347 06/26/23 1353 06/26/23 1708 06/27/23 0021 06/27/23 0841 06/28/23 0529  HGB 15.1  --   --   --   --  14.9 14.9  HCT 45.4  --   --   --   --  44.6 43.9  PLT 232  --   --   --   --  275 274  APTT  --   --   --  32  --   --   --   LABPROT  --   --   --  13.3  --   --   --   INR  --   --   --  1.0  --   --   --   HEPARINUNFRC  --   --   --   --  0.38 0.43 0.17*  CREATININE 1.14  --   --   --   --  1.59* 1.51*  TROPONINIHS  --  15 18*  --   --   --   --     Estimated Creatinine Clearance: 58 mL/min (A) (by C-G formula based on SCr of 1.51 mg/dL (H)).   Medical History: Past Medical History:  Diagnosis Date   Coronary artery disease    Diabetes mellitus without complication (HCC)    Hypertension    S/P CABG x 1     Medications:  Patient is on rivaroxaban  20 mg daily at home per chart review-- according to patient, patient has been staying at a [facility] homeless [facility] for the past few days and not taking his medications  Assessment: Patient is a 68 year old male with a  past medical history of DVT, Afib with RVR, CAD s/p CABG, CKD 3, DM, and tobacco abuse who reports cough, nasal congestion, and chest discomfort. CT chest showed Segmental level filling defects anteriorly in the left lower lobe and in the right upper lobe pulmonary arteries compatible with pulmonary embolus. The right upper lobe pulmonary embolus has a chronic component although may also have an acute component. Clot burden is  small. No lobar or greater size embolus; right ventricular to left ventricular ratio is within normal limits. Pharmacy has been consulted to initiate patient on a heparin  infusion.  Baseline INR and aPTT ordered.  No signs/symptoms of bleeding noted. Hgb 15.1. PLT 232.  Goal of Therapy:  Heparin  level 0.3-0.7 units/ml Monitor platelets by anticoagulation protocol: Yes  01/13 0021 HL 0.38, therapeutic x 1 01/13 0841 HL 0.43, therapeutic x 2 01/14 0529 HL 0.17, SUBtherapeutic    Plan:  1/14:  HL @ 0529 = 0.17, SUBtherapeutic - Will order heparin  2900 units IV X 1 bolus and increase drip rate to 1850 units/hr - Will recheck HL 6 hrs after rate change  Monitor CBC daily while on heparin   Vonetta Foulk D, PharmD 06/28/2023 6:38 AM

## 2023-06-28 NOTE — TOC Transition Note (Signed)
 Transition of Care [facility]) - Discharge Note   Patient Details  Name: Jeremy [name] MRN: [id number] Date of Birth: [birth year]  [facility]) CM/SW Contact:  Lauraine [name], LCSW Phone Number: 06/28/2023, 12:06 PM   Clinical Narrative:  Patient has orders to discharge back to his shelter today. The pastor at the shelter will pick him up when he is ready: [phone]. No further concerns. CSW signing off.  Final next level of care: Homeless Shelter Barriers to Discharge: No Barriers Identified   Patient Goals and CMS Choice            Discharge Placement                Patient to be transferred to facility by: [facility] that runs shelter   Patient and family notified of of transfer: 06/28/23  Discharge Plan and Services Additional resources added to the After Visit Summary for                                       Social Drivers of Health (SDOH) Interventions SDOH Screenings   Food Insecurity: No Food Insecurity (06/03/2023)   Received from [facility]  Recent Concern: Food Insecurity - Food Insecurity Present (03/22/2023)   Received from [facility]  Housing: High Risk (06/17/2023)   Received from [facility]  Transportation Needs: No Transportation Needs (06/03/2023)   Received from [facility]  Recent Concern: Transportation Needs - Unmet Transportation Needs (03/22/2023)   Received from [facility]  Utilities: Not At Risk (06/03/2023)   Received from [facility]  Depression [phone]): Low Risk  (10/30/2018)  Financial Resource Strain: Low Risk  (06/03/2023)   Received [facility]  Recent Concern: Financial Resource Strain - High Risk (03/22/2023)   Received from [facility]  Tobacco Use: High Risk (06/26/2023)     Readmission Risk Interventions     No data to display

## 2023-06-28 NOTE — Progress Notes (Signed)
 Heart Failure Stewardship Pharmacy Note  PCP: Sadie Manna, MD PCP-Cardiologist: None  HPI: Jeremy [name] is a 68 y.o. male with CAD/NSTEMI/CABG, HTN, HLD, chronic HFpEf, COPD Gold stage I, PAF on Xarelto , IIDM, medical noncompliance, polysubstance abuse who presented with cough, weakness, and palpitations. HS-troponin on admission was 15>18. CXR on admission was read WNL. CTA noted segmental level filling defects anteriorly in the left lower lobe and in the right upper lobe pulmonary arteries compatible with pulmonary embolus, possible PH. Patient struggles with medication adherence due to homelessness and vision problems.   Pertinent cardiac history: Underwent CABG in 05/2012. Echo in 05/2021 noted  LVEF of 35%. Underwent pulmonary thrombectomy in 12/2020. Echo 11/2022 with LVEF of 55-60%, mild MR, mild AR. Echo this admission noted LVEF 35-40% with moderate concentric hypertrophy, mild-moderately reduced RV function.  Pertinent Lab Values: Creatinine  Date Value Ref Range Status  10/03/2011 1.11 0.60 - 1.30 mg/dL Final   Creatinine, Ser  Date Value Ref Range Status  06/28/2023 1.51 (H) 0.61 - 1.24 mg/dL Final   BUN  Date Value Ref Range Status  06/28/2023 35 (H) 8 - 23 mg/dL Final  95/78/7986 8 7 - 18 mg/dL Final   Potassium  Date Value Ref Range Status  06/28/2023 3.7 3.5 - 5.1 mmol/L Final  10/03/2011 3.8 3.5 - 5.1 mmol/L Final   Sodium  Date Value Ref Range Status  06/28/2023 138 135 - 145 mmol/L Final  10/03/2011 146 (H) 136 - 145 mmol/L Final   B Natriuretic Peptide  Date Value Ref Range Status  12/28/2022 9.8 0.0 - 100.0 pg/mL Final    Comment:    Performed at [facility], [address] [place], [place] [address]   Magnesium   Date Value Ref Range Status  04/05/2023 2.1 1.7 - 2.4 mg/dL Final    Comment:    Performed at [facility], [address] [place], [place] [address]   Hemoglobin A1C  Date Value Ref Range Status   10/03/2011 6.2 4.2 - 6.3 % Final    Comment:    The American Diabetes Association recommends that a primary goal of therapy should be <7% and that physicians should reevaluate the treatment regimen in patients with HbA1c values consistently >8%.    Hgb A1c MFr Bld  Date Value Ref Range Status  06/26/2023 6.4 (H) 4.8 - 5.6 % Final    Comment:    (NOTE) Pre diabetes:          5.7%-6.4%  Diabetes:              >6.4%  Glycemic control for   <7.0% adults with diabetes    TSH  Date Value Ref Range Status  11/25/2022 0.125 (L) 0.350 - 4.500 uIU/mL Final    Comment:    Performed by a 3rd Generation assay with a functional sensitivity of <=0.01 uIU/mL. Performed at [facility], [address] [place], [place] [address]     Vital Signs: Admission weight: 220 lbs Temp:  [97.7 F (36.5 C)-98.7 F (37.1 C)] 97.7 F (36.5 C) (01/14 1105) Pulse Rate:  [58-127] 58 (01/14 1105) Cardiac Rhythm: Atrial fibrillation (01/14 0752) Resp:  [17-28] 18 (01/14 1105) BP: (95-141)/(40-79) 98/65 (01/14 1105) SpO2:  [92 %-99 %] 99 % (01/14 1105) Weight:  [100 kg (220 lb 7.4 oz)] 100 kg (220 lb 7.4 oz) (01/14 0500)  Intake/Output Summary (Last 24 hours) at 06/28/2023 1219 Last data filed at 06/28/2023 0930 Gross per 24 hour  Intake 814.54 ml  Output 500 ml  Net 314.54 ml   Prior to admission Heart Failure Medications:  Loop diuretic: torsemide  20 mg daily Beta-Blocker: carvedilol  6.25 mg BID ACEI/ARB/ARNI: Entresto  24-26 mg BID MRA: spironolactone  12.5 mg daily SGLT2i: none  Assessment: 1. 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 NYHA class II symptoms.  -Symptoms: Seems to have chronic AMS, not due to HF. Denies symptoms.  -Volume: Appears euvolemic today  Plan: 1) Medication changes recommended at this time: -Discharge orders entered at the time of this note. Can attempt to restart spironolactone  and initiate SGLT2i outpatient as  appropriate  2) Patient assistance: -Patient's medications are covered for $0. Patient is using [facility] pharmacy for meds to beds. Patient has difficulty with adherence due to his houseless state and inability to read. The pharmacy team on the floor is working to organize medications for the patient either in pill organizers or in separate bottles. If this fails, it may be best for the patient to transition to only once daily medications (losartan , metoprolol  ER, spironolactone , Farxiga, Xarelto ) given barriers to reading.  3) Education: - Patient has been educated on current HF medications and potential additions to HF medication regimen - Patient verbalizes understanding that over the next few months, these medication doses may change and more medications may be added to optimize HF regimen - Patient has been educated on basic disease state pathophysiology and goals of therapy  Medication Assistance / Insurance Benefits Check: Does the patient have prescription insurance?    Type of insurance plan:  Does the patient qualify for medication assistance through manufacturers or grants? No  Outpatient Pharmacy: Prior to admission outpatient pharmacy: [facility]   Is the patient willing to utilize a [facility] pharmacy at discharge?: Yes  Please do not hesitate to reach out with questions or concerns,  Jaun Bash, PharmD, CPP, BCPS Heart Failure Pharmacist  Phone - [phone] 06/28/2023 12:19 PM

## 2023-06-28 NOTE — Discharge Summary (Signed)
 Physician Discharge Summary   Patient: Jeremy [name] MRN: [id number]  DOB: Nov 24, 1955   Admit:     Date of Admission: 06/26/2023 Admitted from: home   Discharge: Date of discharge: 06/28/23 Disposition: Home Condition at discharge: good  CODE STATUS: FULL CODE     Discharge Physician: Laneta Blunt, DO [facility]     PCP: Sadie Manna, MD  Recommendations for Outpatient Follow-up:  Follow up with PCP Sadie Manna, MD in 1-2 weeks Follow up with cardiology - appt w/ Dr Florencio     Discharge Instructions     Diet - low sodium heart healthy   Complete by: As directed    Increase activity slowly   Complete by: As directed          Discharge Diagnoses: Principal Problem:   Afib (HCC) Active Problems:   Acute pulmonary embolism (HCC)   Atrial fibrillation with RVR (HCC)   COPD with acute exacerbation [facility])       Hospital course / significant events:   HPI: Jeremy [name] is a 68 y.o. male with medical history significant of CAD/NSTEMI/CABG, HTN, HLD, chronic HFpEf, COPD Gold stage I, PAF on Xarelto , IIDM, medical noncompliance, polysubstance abuse, presented with worsening of cough and palpitations.   01/12: admitted for PE and Afib RVR though rate went down, started heparin  gtt, echo pending 01/13: Echo abn - compared to echo 11/2022: now LVEF 35-40% vs 55-60%, now LV global hypokinesis vs no RWMA, now RV mild/mod reduced systolic fxn vs normal fxn, Both - no significant valvular abn. Cardiology to consult - [place] to see tomorrow possibly will need ischemic eval. Continue heparin .  01/14: cleared by cardiology, medications as below were arranged to deliver to bedside prior to discharge.     Consultants:  Cardiology   Procedures/Surgeries: none      ASSESSMENT & PLAN:   Acute bilateral segmental PE, on chronic bilateral PE Along with a known PAF, indication for lifelong anticoagulation.  Patient however is known for  his noncompliance with medications particularly anticoagulations. Back onto Xarelto  see med rec    PAF  Tachycardia, sinus Likely secondary to PE See med rec - amiodarone , carvedilol , xarelto     Chronic HFpEF --> Echo demonstrates new chronic HFrEF and concern for mild RV strain though likely also chronic could be d/t or complicated by PE  HTN CAD Euvolemic, blood pressure borderline low Hold off spironolactone  and Imdur   Continue carvedilol  continue Entresto   Contineu torsemide    COPD exacerbation ruled out  Monitor outpatient    Dysuria UA normal Monitor outpatient     IDDM Can resume home metformin     Medical noncompliance Claimed that he has been homeless, with poor vision, sometimes hard to read the labels on the pill boxes and patient requests home visiting nurse vs other help.  consult case management. Meds to bed and education/labeling to account for his limitations               Discharge Instructions  Allergies as of 06/28/2023       Reactions   Amoxicillin  Swelling   Keflex  [cephalexin ] Rash        Medication List     STOP taking these medications    Linzess  72 MCG capsule Generic drug: linaclotide    spironolactone  25 MG tablet Commonly known as: ALDACTONE        TAKE these medications    acetaminophen  500 MG tablet Commonly known as: TYLENOL  Take 2 tablets (1,000 mg total) by mouth every  8 (eight) hours as needed for moderate pain.   amiodarone  200 MG tablet Commonly known as: PACERONE  Take 1 tablet (200 mg total) by mouth daily.   aspirin  81 MG chewable tablet Chew 1 tablet (81 mg total) by mouth daily.   atorvastatin  40 MG tablet Commonly known as: LIPITOR Take 1 tablet (40 mg total) by mouth daily.   carvedilol  6.25 MG tablet Commonly known as: COREG  Take 1 tablet (6.25 mg total) by mouth 2 (two) times daily with meals.   metFORMIN  1000 MG tablet Commonly known as: GLUCOPHAGE  Take 1 tablet (1,000 mg total) by  mouth once daily with breakfast.   nitroGLYCERIN  0.4 MG SL tablet Commonly known as: NITROSTAT  Place 1 tablet (0.4 mg total) under the tongue every 5 (five) minutes as needed for chest pain.   pantoprazole  20 MG tablet Commonly known as: PROTONIX  Take 1 tablet (20 mg total) by mouth daily.   Rivaroxaban  15 MG Tabs tablet Commonly known as: XARELTO  Take 1 tablet (15 mg total) by mouth 2 (two) times daily with a meal for 21 days. What changed: You were already taking a medication with the same name, and this prescription was added. Make sure you understand how and when to take each.   rivaroxaban  20 MG Tabs tablet Commonly known as: Xarelto  Take 1 tablet (20 mg total) by mouth daily at 5 pm Start taking on: July 20, 2023 What changed: These instructions start on July 20, 2023. If you are unsure what to do until then, ask your doctor or other care provider.   sacubitril -valsartan  24-26 MG Commonly known as: ENTRESTO  Take 1 tablet by mouth every 12 (twelve) hours.   torsemide  20 MG tablet Commonly known as: DEMADEX  Take 1 tablet (20 mg total) by mouth daily.   traMADol  50 MG tablet Commonly known as: ULTRAM  Take 1 tablet (50 mg total) by mouth daily as needed for pain.         Follow-up Information     Florencio Cara BIRCH, MD. Go in 1 week(s).   Specialties: Cardiology, Internal Medicine Contact information: [address] [place] [address] [phone]                 Allergies  Allergen Reactions   Amoxicillin  Swelling   Keflex  [Cephalexin ] Rash     Subjective: pt reports some pain on lower R torso, tender to touch, no l/central chest pain. No SOB. Reports doesn't want to get out of bed today. Was ambulating normally yesterday.    Discharge Exam: BP 98/65 (BP Location: Left Arm)   Pulse (!) 58   Temp 97.7 F (36.5 C) (Oral)   Resp 18   Ht 5' 11 (1.803 m)   Wt 100 kg   SpO2 99%   BMI 30.75 kg/m  General: Pt is alert, awake, not  in acute distress Cardiovascular: RRR, S1/S2 +tenderness on R midaxillary lower ribs and no other abnormality, no rubs, no gallops Respiratory: CTA bilaterally, no wheezing, no rhonchi Abdominal: Soft, NT, ND, bowel sound Extremities: no edema, no cyanosis     The results of significant diagnostics from this hospitalization (including imaging, microbiology, ancillary and laboratory) are listed below for reference.     Microbiology: Recent Results (from the past 240 hours)  Resp panel by RT-PCR (RSV, Flu A&B, Covid) Anterior Nasal Swab     Status: None   Collection Time: 06/26/23 11:51 AM   Specimen: Anterior Nasal Swab  Result Value Ref Range Status   SARS Coronavirus  2 by RT PCR NEGATIVE NEGATIVE Final    Comment: (NOTE) SARS-CoV-2 target nucleic acids are NOT DETECTED.  The SARS-CoV-2 RNA is generally detectable in upper respiratory specimens during the acute phase of infection. The lowest concentration of SARS-CoV-2 viral copies this assay can detect is 138 copies/mL. A negative result does not preclude SARS-Cov-2 infection and should not be used as the sole basis for treatment or other patient management decisions. A negative result may occur with  improper specimen collection/handling, submission of specimen other than nasopharyngeal swab, presence of viral mutation(s) within the areas targeted by this assay, and inadequate number of viral copies(<138 copies/mL). A negative result must be combined with clinical observations, patient history, and epidemiological information. The expected result is Negative.  Fact Sheet for Patients:  bloggercourse.com  Fact Sheet for Healthcare Providers:  seriousbroker.it  This test is no t yet approved or cleared by the [place]  FDA and  has been authorized for detection and/or diagnosis of SARS-CoV-2 by FDA under an Emergency Use Authorization (EUA). This EUA will remain  in  effect (meaning this test can be used) for the duration of the COVID-19 declaration under Section 564(b)(1) of the Act, 21 U.S.C.section 360bbb-3(b)(1), unless the authorization is terminated  or revoked sooner.       Influenza A by PCR NEGATIVE NEGATIVE Final   Influenza B by PCR NEGATIVE NEGATIVE Final    Comment: (NOTE) The Xpert Xpress SARS-CoV-2/FLU/RSV plus assay is intended as an aid in the diagnosis of influenza from Nasopharyngeal swab specimens and should not be used as a sole basis for treatment. Nasal washings and aspirates are unacceptable for Xpert Xpress SARS-CoV-2/FLU/RSV testing.  Fact Sheet for Patients: bloggercourse.com  Fact Sheet for Healthcare Providers: seriousbroker.it  This test is not yet approved or cleared by the [place]  FDA and has been authorized for detection and/or diagnosis of SARS-CoV-2 by FDA under an Emergency Use Authorization (EUA). This EUA will remain in effect (meaning this test can be used) for the duration of the COVID-19 declaration under Section 564(b)(1) of the Act, 21 U.S.C. section 360bbb-3(b)(1), unless the authorization is terminated or revoked.     Resp Syncytial Virus by PCR NEGATIVE NEGATIVE Final    Comment: (NOTE) Fact Sheet for Patients: bloggercourse.com  Fact Sheet for Healthcare Providers: seriousbroker.it  This test is not yet approved or cleared by the [place]  FDA and has been authorized for detection and/or diagnosis of SARS-CoV-2 by FDA under an Emergency Use Authorization (EUA). This EUA will remain in effect (meaning this test can be used) for the duration of the COVID-19 declaration under Section 564(b)(1) of the Act, 21 U.S.C. section 360bbb-3(b)(1), unless the authorization is terminated or revoked.  Performed at [facility], [address] [place], [place] [address]       Labs: BNP (last 3 results) Recent Labs    12/11/22 1213 12/28/22 1443  BNP 92.0 9.8   Basic Metabolic Panel: Recent Labs  Lab 06/26/23 1151 06/27/23 0841 06/28/23 0529  NA 134* 136 138  K 4.1 4.5 3.7  CL 108 104 107  CO2 18* 22 22  GLUCOSE 142* 182* 128*  BUN 18 25* 35*  CREATININE 1.14 1.59* 1.51*  CALCIUM  8.8* 9.0 8.9   Liver Function Tests: No results for input(s): AST, ALT, ALKPHOS, BILITOT, PROT, ALBUMIN in the last 168 hours. No results for input(s): LIPASE, AMYLASE in the last 168 hours. No results for input(s): AMMONIA in the last 168 hours. CBC: Recent Labs  Lab 06/26/23 1151 06/27/23 0841 06/28/23 0529  WBC 7.3 17.5* 12.3*  NEUTROABS 4.5  --   --   HGB 15.1 14.9 14.9  HCT 45.4 44.6 43.9  MCV 95.2 94.7 92.2  PLT 232 275 274   Cardiac Enzymes: No results for input(s): CKTOTAL, CKMB, CKMBINDEX, TROPONINI in the last 168 hours. BNP: Invalid input(s): POCBNP CBG: Recent Labs  Lab 06/27/23 1143 06/27/23 1703 06/27/23 2107 06/28/23 0835 06/28/23 1044  GLUCAP 132* 140* 232* 143* 102*   D-Dimer No results for input(s): DDIMER in the last 72 hours. Hgb A1c Recent Labs    06/26/23 1151  HGBA1C 6.4*   Lipid Profile No results for input(s): CHOL, HDL, LDLCALC, TRIG, CHOLHDL, LDLDIRECT in the last 72 hours. Thyroid  function studies No results for input(s): TSH, T4TOTAL, T3FREE, THYROIDAB in the last 72 hours.  Invalid input(s): FREET3 Anemia work up No results for input(s): VITAMINB12, FOLATE, FERRITIN, TIBC, IRON, RETICCTPCT in the last 72 hours. Urinalysis    Component Value Date/Time   COLORURINE YELLOW (A) 06/26/2023 1631   APPEARANCEUR CLEAR (A) 06/26/2023 1631   LABSPEC 1.023 06/26/2023 1631   PHURINE 5.0 06/26/2023 1631   GLUCOSEU NEGATIVE 06/26/2023 1631   HGBUR NEGATIVE 06/26/2023 1631   BILIRUBINUR NEGATIVE 06/26/2023 1631   KETONESUR NEGATIVE 06/26/2023 1631    PROTEINUR NEGATIVE 06/26/2023 1631   NITRITE NEGATIVE 06/26/2023 1631   LEUKOCYTESUR NEGATIVE 06/26/2023 1631   Sepsis Labs Recent Labs  Lab 06/26/23 1151 06/27/23 0841 06/28/23 0529  WBC 7.3 17.5* 12.3*   Microbiology Recent Results (from the past 240 hours)  Resp panel by RT-PCR (RSV, Flu A&B, Covid) Anterior Nasal Swab     Status: None   Collection Time: 06/26/23 11:51 AM   Specimen: Anterior Nasal Swab  Result Value Ref Range Status   SARS Coronavirus 2 by RT PCR NEGATIVE NEGATIVE Final    Comment: (NOTE) SARS-CoV-2 target nucleic acids are NOT DETECTED.  The SARS-CoV-2 RNA is generally detectable in upper respiratory specimens during the acute phase of infection. The lowest concentration of SARS-CoV-2 viral copies this assay can detect is 138 copies/mL. A negative result does not preclude SARS-Cov-2 infection and should not be used as the sole basis for treatment or other patient management decisions. A negative result may occur with  improper specimen collection/handling, submission of specimen other than nasopharyngeal swab, presence of viral mutation(s) within the areas targeted by this assay, and inadequate number of viral copies(<138 copies/mL). A negative result must be combined with clinical observations, patient history, and epidemiological information. The expected result is Negative.  Fact Sheet for Patients:  bloggercourse.com  Fact Sheet for Healthcare Providers:  seriousbroker.it  This test is no t yet approved or cleared by the [place]  FDA and  has been authorized for detection and/or diagnosis of SARS-CoV-2 by FDA under an Emergency Use Authorization (EUA). This EUA will remain  in effect (meaning this test can be used) for the duration of the COVID-19 declaration under Section 564(b)(1) of the Act, 21 U.S.C.section 360bbb-3(b)(1), unless the authorization is terminated  or revoked sooner.        Influenza A by PCR NEGATIVE NEGATIVE Final   Influenza B by PCR NEGATIVE NEGATIVE Final    Comment: (NOTE) The Xpert Xpress SARS-CoV-2/FLU/RSV plus assay is intended as an aid in the diagnosis of influenza from Nasopharyngeal swab specimens and should not be used as a sole basis for treatment. Nasal washings and aspirates are unacceptable for Xpert Xpress SARS-CoV-2/FLU/RSV testing.  Fact Sheet  for Patients: bloggercourse.com  Fact Sheet for Healthcare Providers: seriousbroker.it  This test is not yet approved or cleared by the [place]  FDA and has been authorized for detection and/or diagnosis of SARS-CoV-2 by FDA under an Emergency Use Authorization (EUA). This EUA will remain in effect (meaning this test can be used) for the duration of the COVID-19 declaration under Section 564(b)(1) of the Act, 21 U.S.C. section 360bbb-3(b)(1), unless the authorization is terminated or revoked.     Resp Syncytial Virus by PCR NEGATIVE NEGATIVE Final    Comment: (NOTE) Fact Sheet for Patients: bloggercourse.com  Fact Sheet for Healthcare Providers: seriousbroker.it  This test is not yet approved or cleared by the [place]  FDA and has been authorized for detection and/or diagnosis of SARS-CoV-2 by FDA under an Emergency Use Authorization (EUA). This EUA will remain in effect (meaning this test can be used) for the duration of the COVID-19 declaration under Section 564(b)(1) of the Act, 21 U.S.C. section 360bbb-3(b)(1), unless the authorization is terminated or revoked.  Performed at [facility], [address] [place], [place] [address]    Imaging ECHOCARDIOGRAM COMPLETE Result Date: 06/27/2023    ECHOCARDIOGRAM REPORT   Patient Name:   ZOHAIB HEENEY Date of Exam: 06/27/2023 Medical Rec #:  [id number]       Height:       71.0 in Accession #:    [id number]       Weight:       220.0 lb Date of Birth:  Aug 24, 1955      BSA:          2.196 m Patient Age:    67 years        BP:           103/73 mmHg Patient Gender: M               HR:           117 bpm. Exam Location:  [facility] Procedure: 2D Echo, Cardiac Doppler, Color Doppler and Intracardiac            Opacification Agent Indications:     Pulmonary embolus  History:         Patient has prior history of Echocardiogram examinations, most                  recent 11/24/2022. Previous Myocardial Infarction and CAD, Prior                  CABG, COPD, Arrythmias:Atrial Fibrillation; Risk                  Factors:Hypertension, Sleep Apnea, Diabetes, Dyslipidemia and                  Current Smoker. Polysubstance abuse, Pulmonary embolus.  Sonographer:     Naomie Reef Referring Phys:  [id number] CORT ONEIDA MANA Diagnosing Phys: Keller Paterson  Sonographer Comments: Technically difficult study due to poor echo windows. Image acquisition challenging due to uncooperative patient and Image acquisition challenging due to COPD. IMPRESSIONS  1. Technically difficult study inspite of using Definity .  2. Left ventricular ejection fraction, by estimation, is 35 to 40%. The left ventricle has moderately decreased function. The left ventricle demonstrates global hypokinesis. There is moderate concentric left ventricular hypertrophy. Left ventricular diastolic parameters are indeterminate.  3. Right ventricle incompletely visualized. Grossly appears normal in size with mild to moderately reduced systolic function.  4. The mitral valve is normal in structure. Trivial mitral valve  regurgitation.  5. The aortic valve is tricuspid. There is mild calcification of the aortic valve. There is mild thickening of the aortic valve. Aortic valve regurgitation is mild to moderate.  6. The inferior vena cava is normal in size with greater than 50% respiratory variability, suggesting right atrial pressure of 3 mmHg. FINDINGS  Left Ventricle: Left ventricular ejection  fraction, by estimation, is 35 to 40%. The left ventricle has moderately decreased function. The left ventricle demonstrates global hypokinesis. Definity  contrast agent was given IV to delineate the left ventricular endocardial borders. The left ventricular internal cavity size was normal in size. There is moderate concentric left ventricular hypertrophy. Left ventricular diastolic parameters are indeterminate. Right Ventricle: Right ventricle incompletely visualized. Grossly appears normal in size with mild to moderately reduced systolic function. Right vetricular wall thickness was not well visualized. Left Atrium: Left atrial size was normal in size. Right Atrium: Right atrial size was normal in size. Pericardium: There is no evidence of pericardial effusion. Mitral Valve: The mitral valve is normal in structure. Trivial mitral valve regurgitation. MV peak gradient, 3.0 mmHg. The mean mitral valve gradient is 1.0 mmHg. Tricuspid Valve: The tricuspid valve is normal in structure. Tricuspid valve regurgitation is trivial. Aortic Valve: The aortic valve is tricuspid. There is mild calcification of the aortic valve. There is mild thickening of the aortic valve. Aortic valve regurgitation is mild to moderate. Aortic regurgitation PHT measures 779 msec. Aortic valve mean gradient measures 2.3 mmHg. Aortic valve peak gradient measures 4.9 mmHg. Aortic valve area, by VTI measures 3.23 cm. Pulmonic Valve: The pulmonic valve was not well visualized. Pulmonic valve regurgitation is not visualized. Aorta: The aortic root and ascending aorta are structurally normal, with no evidence of dilitation. Venous: The inferior vena cava is normal in size with greater than 50% respiratory variability, suggesting right atrial pressure of 3 mmHg. IAS/Shunts: The interatrial septum was not well visualized.  LEFT VENTRICLE PLAX 2D LVIDd:         5.70 cm LVIDs:         4.90 cm LV PW:         1.30 cm LV IVS:        1.50 cm LVOT diam:      2.10 cm LV SV:         56 LV SV Index:   25 LVOT Area:     3.46 cm  RIGHT VENTRICLE RV Basal diam:  4.50 cm RV Mid diam:    2.70 cm RV S prime:     9.90 cm/s LEFT ATRIUM             Index        RIGHT ATRIUM           Index LA diam:        4.30 cm 1.96 cm/m   RA Area:     19.70 cm LA Vol (A2C):   71.9 ml 32.75 ml/m  RA Volume:   56.30 ml  25.64 ml/m LA Vol (A4C):   38.4 ml 17.49 ml/m LA Biplane Vol: 52.7 ml 24.00 ml/m  AORTIC VALVE                    PULMONIC VALVE AV Area (Vmax):    3.04 cm     PV Vmax:       0.87 m/s AV Area (Vmean):   3.13 cm     PV Peak grad:  3.0 mmHg AV Area (VTI):  3.23 cm AV Vmax:           110.87 cm/s AV Vmean:          70.767 cm/s AV VTI:            0.173 m AV Peak Grad:      4.9 mmHg AV Mean Grad:      2.3 mmHg LVOT Vmax:         97.40 cm/s LVOT Vmean:        63.900 cm/s LVOT VTI:          0.161 m LVOT/AV VTI ratio: 0.93 AI PHT:            779 msec  AORTA Ao Root diam: 3.60 cm Ao Asc diam:  3.50 cm MITRAL VALVE MV Area (PHT): 4.74 cm    SHUNTS MV Area VTI:   4.13 cm    Systemic VTI:  0.16 m MV Peak grad:  3.0 mmHg    Systemic Diam: 2.10 cm MV Mean grad:  1.0 mmHg MV Vmax:       0.87 m/s MV Vmean:      44.6 cm/s MV Decel Time: 160 msec MV E velocity: 88.00 cm/s Keller Paterson Electronically signed by Keller Paterson Signature Date/Time: 06/27/2023/3:59:56 PM    Final       Time coordinating discharge: over 30 minutes  SIGNED:  Adalyne Lovick DO [facility]

## 2023-06-28 NOTE — Plan of Care (Signed)
  Problem: Education: Goal: Ability to describe self-care measures that may prevent or decrease complications (Diabetes Survival Skills Education) will improve Outcome: Progressing   Problem: Fluid Volume: Goal: Ability to maintain a balanced intake and output will improve Outcome: Progressing   Problem: Health Behavior/Discharge Planning: Goal: Ability to identify and utilize available resources and services will improve Outcome: Progressing   Problem: Metabolic: Goal: Ability to maintain appropriate glucose levels will improve Outcome: Progressing   Problem: Nutritional: Goal: Maintenance of adequate nutrition will improve Outcome: Progressing   Problem: Coping: Goal: Ability to adjust to condition or change in health will improve Outcome: Not Progressing   Problem: Health Behavior/Discharge Planning: Goal: Ability to manage health-related needs will improve Outcome: Not Progressing   Problem: Nutritional: Goal: Progress toward achieving an optimal weight will improve Outcome: Not Progressing   Problem: Education: Goal: Knowledge of General Education information will improve Description: Including pain rating scale, medication(s)/side effects and non-pharmacologic comfort measures Outcome: Not Progressing   Problem: Coping: Goal: Level of anxiety will decrease Outcome: Not Progressing

## 2023-06-28 NOTE — Telephone Encounter (Signed)
 Patient Product/process Development Scientist completed.    The patient is insured through [facility]. Patient has Medicare and is not eligible for a copay card, but may be able to apply for patient assistance or Medicare RX Payment Plan (Patient Must reach out to their plan, if eligible for payment plan), if available.    Ran test claim for Xarelto  Starter Pack and the current 30 day co-pay is $0.00.   This test claim was processed through [facility]- copay amounts may vary at other pharmacies due to pharmacy/plan contracts, or as the patient moves through the different stages of their insurance plan.     Reyes Sharps, CPHT Pharmacy Technician III Certified Patient Advocate [facility] Patient Advocate Team Direct Number: [phone]  Fax: [phone]

## 2023-07-04 ENCOUNTER — Encounter (INDEPENDENT_AMBULATORY_CARE_PROVIDER_SITE_OTHER): Payer: Self-pay

## 2023-07-08 ENCOUNTER — Emergency Department
Admission: EM | Admit: 2023-07-08 | Discharge: 2023-07-08 | Disposition: A | Discharge status: Home / Self Care | Payer: 59 | Attending: Emergency Medicine | Admitting: Emergency Medicine

## 2023-07-08 ENCOUNTER — Other Ambulatory Visit: Payer: Self-pay

## 2023-07-08 DIAGNOSIS — I2510 Atherosclerotic heart disease of native coronary artery without angina pectoris: Secondary | ICD-10-CM | POA: Insufficient documentation

## 2023-07-08 DIAGNOSIS — Z951 Presence of aortocoronary bypass graft: Secondary | ICD-10-CM | POA: Diagnosis not present

## 2023-07-08 DIAGNOSIS — I10 Essential (primary) hypertension: Secondary | ICD-10-CM | POA: Insufficient documentation

## 2023-07-08 DIAGNOSIS — R531 Weakness: Secondary | ICD-10-CM | POA: Insufficient documentation

## 2023-07-08 DIAGNOSIS — E119 Type 2 diabetes mellitus without complications: Secondary | ICD-10-CM | POA: Diagnosis not present

## 2023-07-08 DIAGNOSIS — R002 Palpitations: Secondary | ICD-10-CM | POA: Diagnosis not present

## 2023-07-08 LAB — COMPREHENSIVE METABOLIC PANEL
ALT: 13 U/L (ref 0–44)
AST: 18 U/L (ref 15–41)
Albumin: 3.7 g/dL (ref 3.5–5.0)
Alkaline Phosphatase: 55 U/L (ref 38–126)
Anion gap: 9 (ref 5–15)
BUN: 34 mg/dL — ABNORMAL HIGH (ref 8–23)
CO2: 26 mmol/L (ref 22–32)
Calcium: 9.3 mg/dL (ref 8.9–10.3)
Chloride: 104 mmol/L (ref 98–111)
Creatinine, Ser: 1.46 mg/dL — ABNORMAL HIGH (ref 0.61–1.24)
GFR, Estimated: 53 mL/min — ABNORMAL LOW (ref >60)
Glucose, Bld: 107 mg/dL — ABNORMAL HIGH (ref 70–99)
Potassium: 4.2 mmol/L (ref 3.5–5.1)
Sodium: 139 mmol/L (ref 135–145)
Total Bilirubin: 0.9 mg/dL (ref 0.0–1.2)
Total Protein: 7 g/dL (ref 6.5–8.1)

## 2023-07-08 LAB — CBC
HCT: 42.6 % (ref 39.0–52.0)
Hemoglobin: 14.2 g/dL (ref 13.0–17.0)
MCH: 31.2 pg (ref 26.0–34.0)
MCHC: 33.3 g/dL (ref 30.0–36.0)
MCV: 93.6 fL (ref 80.0–100.0)
Platelets: 258 10*3/uL (ref 150–400)
RBC: 4.55 MIL/uL (ref 4.22–5.81)
RDW: 14.3 % (ref 11.5–15.5)
WBC: 7 10*3/uL (ref 4.0–10.5)
nRBC: 0 % (ref 0.0–0.2)

## 2023-07-08 LAB — TROPONIN I (HIGH SENSITIVITY): Troponin I (High Sensitivity): 11 ng/L (ref <18)

## 2023-07-08 NOTE — ED Notes (Signed)
Called lab to ask for labs to be drawn.

## 2023-07-08 NOTE — ED Provider Notes (Signed)
   [facility] Provider Note    None    (approximate)  History   Chief Complaint: Weakness  HPI  Jeremy [name] is a 68 y.o. male with a past medical history of paroxysmal atrial fibrillation, diabetes, hypertension, CAD status post CABG, presents to the emergency department for weakness and palpitations.  According to the patient EMS picked the patient up from the shelter this morning when he was complaining of palpitations he feels like his heart is fluttering in his chest which is led him to feel weak today.  Here the patient overall appears well denies any chest pain or abdominal pain.  Patient has been ambulatory to the stretcher, he is requesting something to eat and drink.  Also asking if he could "just be admitted for 3 or 4 days."  Physical Exam   Most recent vital signs: There were no vitals filed for this visit.  General: Awake, no distress.  CV:  Good peripheral perfusion.  Irregular rhythm rate around 100 bpm. Resp:  Normal effort.  Equal breath sounds bilaterally.  Abd:  No distention.  Soft, nontender.  No rebound or guarding.  ED Results / Procedures / Treatments   EKG  EKG viewed and interpreted by myself shows atrial flutter at 79 bpm with a narrow QRS, normal axis, largely normal intervals and nonspecific ST changes.   MEDICATIONS ORDERED IN ED: Medications - No data to display   IMPRESSION / MDM / ASSESSMENT AND PLAN / ED COURSE  I reviewed the triage vital signs and the nursing notes.  Patient's presentation is most consistent with acute presentation with potential threat to life or bodily function.  Patient presents to the emergency department with complaints of feeling like his heart is fluttering and generalized weakness.  Overall the patient appears well.  He is well-known to myself, appears to be at his baseline.  Patient is coming from the homeless shelter, and is requesting to be admitted for a few days per patient.  We will  check labs, we will obtain an EKG and continue to closely monitor.  Patient's workup shows a reassuring CBC, reassuring chemistry, negative troponin.  EKG shows no significant finding.  Patient states he is feeling much better.  He has been ambulatory around the emergency department today.  He is on his second meal tray and has drinking 4 things of apple juice.  As patient appears well we will discharge with outpatient follow-up.  Patient agreeable to plan.  FINAL CLINICAL IMPRESSION(S) / ED DIAGNOSES   Weakness    Note:  This document was prepared using Dragon voice recognition software and may include unintentional dictation errors.   [name], MD 07/08/23 1310

## 2023-07-08 NOTE — ED Triage Notes (Signed)
"  Coming from shelter, stated he felt weak and his heart felt funny, a-fib on monitor with chronic history" per EMS

## 2023-07-18 ENCOUNTER — Other Ambulatory Visit: Payer: Self-pay

## 2023-07-31 ENCOUNTER — Emergency Department: Payer: 59

## 2023-07-31 ENCOUNTER — Inpatient Hospital Stay
Admission: EM | Admit: 2023-07-31 | Discharge: 2023-08-04 | DRG: 871 | Discharge status: Against Medical Advice | Payer: 59 | Attending: Internal Medicine | Admitting: Internal Medicine

## 2023-07-31 ENCOUNTER — Other Ambulatory Visit: Payer: Self-pay

## 2023-07-31 DIAGNOSIS — Z7901 Long term (current) use of anticoagulants: Secondary | ICD-10-CM

## 2023-07-31 DIAGNOSIS — A419 Sepsis, unspecified organism: Secondary | ICD-10-CM | POA: Diagnosis not present

## 2023-07-31 DIAGNOSIS — G9341 Metabolic encephalopathy: Secondary | ICD-10-CM | POA: Diagnosis present

## 2023-07-31 DIAGNOSIS — I5023 Acute on chronic systolic (congestive) heart failure: Secondary | ICD-10-CM | POA: Diagnosis present

## 2023-07-31 DIAGNOSIS — Z79899 Other long term (current) drug therapy: Secondary | ICD-10-CM

## 2023-07-31 DIAGNOSIS — I480 Paroxysmal atrial fibrillation: Secondary | ICD-10-CM | POA: Diagnosis present

## 2023-07-31 DIAGNOSIS — I959 Hypotension, unspecified: Secondary | ICD-10-CM

## 2023-07-31 DIAGNOSIS — E872 Acidosis, unspecified: Secondary | ICD-10-CM | POA: Diagnosis present

## 2023-07-31 DIAGNOSIS — E871 Hypo-osmolality and hyponatremia: Secondary | ICD-10-CM | POA: Diagnosis present

## 2023-07-31 DIAGNOSIS — E861 Hypovolemia: Secondary | ICD-10-CM | POA: Diagnosis present

## 2023-07-31 DIAGNOSIS — Z7982 Long term (current) use of aspirin: Secondary | ICD-10-CM

## 2023-07-31 DIAGNOSIS — F17210 Nicotine dependence, cigarettes, uncomplicated: Secondary | ICD-10-CM | POA: Diagnosis present

## 2023-07-31 DIAGNOSIS — E1122 Type 2 diabetes mellitus with diabetic chronic kidney disease: Secondary | ICD-10-CM | POA: Diagnosis present

## 2023-07-31 DIAGNOSIS — R627 Adult failure to thrive: Secondary | ICD-10-CM | POA: Diagnosis present

## 2023-07-31 DIAGNOSIS — E875 Hyperkalemia: Secondary | ICD-10-CM | POA: Diagnosis present

## 2023-07-31 DIAGNOSIS — T68XXXA Hypothermia, initial encounter: Principal | ICD-10-CM

## 2023-07-31 DIAGNOSIS — E119 Type 2 diabetes mellitus without complications: Secondary | ICD-10-CM | POA: Diagnosis not present

## 2023-07-31 DIAGNOSIS — N170 Acute kidney failure with tubular necrosis: Secondary | ICD-10-CM | POA: Diagnosis present

## 2023-07-31 DIAGNOSIS — Z6829 Body mass index (BMI) 29.0-29.9, adult: Secondary | ICD-10-CM

## 2023-07-31 DIAGNOSIS — N1831 Chronic kidney disease, stage 3a: Secondary | ICD-10-CM | POA: Diagnosis present

## 2023-07-31 DIAGNOSIS — R4182 Altered mental status, unspecified: Secondary | ICD-10-CM

## 2023-07-31 DIAGNOSIS — R571 Hypovolemic shock: Secondary | ICD-10-CM | POA: Diagnosis present

## 2023-07-31 DIAGNOSIS — A4151 Sepsis due to Escherichia coli [E. coli]: Principal | ICD-10-CM | POA: Diagnosis present

## 2023-07-31 DIAGNOSIS — Z5329 Procedure and treatment not carried out because of patient's decision for other reasons: Secondary | ICD-10-CM | POA: Diagnosis present

## 2023-07-31 DIAGNOSIS — Z7984 Long term (current) use of oral hypoglycemic drugs: Secondary | ICD-10-CM

## 2023-07-31 DIAGNOSIS — I2510 Atherosclerotic heart disease of native coronary artery without angina pectoris: Secondary | ICD-10-CM | POA: Diagnosis present

## 2023-07-31 DIAGNOSIS — Z1612 Extended spectrum beta lactamase (ESBL) resistance: Secondary | ICD-10-CM | POA: Diagnosis present

## 2023-07-31 DIAGNOSIS — Z86718 Personal history of other venous thrombosis and embolism: Secondary | ICD-10-CM

## 2023-07-31 DIAGNOSIS — I130 Hypertensive heart and chronic kidney disease with heart failure and stage 1 through stage 4 chronic kidney disease, or unspecified chronic kidney disease: Secondary | ICD-10-CM | POA: Diagnosis present

## 2023-07-31 DIAGNOSIS — I255 Ischemic cardiomyopathy: Secondary | ICD-10-CM | POA: Diagnosis present

## 2023-07-31 DIAGNOSIS — R6521 Severe sepsis with septic shock: Secondary | ICD-10-CM | POA: Diagnosis present

## 2023-07-31 DIAGNOSIS — I2782 Chronic pulmonary embolism: Secondary | ICD-10-CM | POA: Diagnosis present

## 2023-07-31 DIAGNOSIS — Z8249 Family history of ischemic heart disease and other diseases of the circulatory system: Secondary | ICD-10-CM

## 2023-07-31 DIAGNOSIS — I351 Nonrheumatic aortic (valve) insufficiency: Secondary | ICD-10-CM | POA: Diagnosis present

## 2023-07-31 DIAGNOSIS — Z951 Presence of aortocoronary bypass graft: Secondary | ICD-10-CM

## 2023-07-31 DIAGNOSIS — E669 Obesity, unspecified: Secondary | ICD-10-CM | POA: Diagnosis present

## 2023-07-31 DIAGNOSIS — I5021 Acute systolic (congestive) heart failure: Secondary | ICD-10-CM | POA: Diagnosis not present

## 2023-07-31 DIAGNOSIS — Z881 Allergy status to other antibiotic agents status: Secondary | ICD-10-CM

## 2023-07-31 DIAGNOSIS — N179 Acute kidney failure, unspecified: Secondary | ICD-10-CM | POA: Diagnosis not present

## 2023-07-31 DIAGNOSIS — Z880 Allergy status to penicillin: Secondary | ICD-10-CM

## 2023-07-31 LAB — CBG MONITORING, ED
Glucose-Capillary: 144 mg/dL — ABNORMAL HIGH (ref 70–99)
Glucose-Capillary: 148 mg/dL — ABNORMAL HIGH (ref 70–99)

## 2023-07-31 LAB — COMPREHENSIVE METABOLIC PANEL
ALT: 20 U/L (ref 0–44)
AST: 19 U/L (ref 15–41)
Albumin: 2.9 g/dL — ABNORMAL LOW (ref 3.5–5.0)
Alkaline Phosphatase: 72 U/L (ref 38–126)
Anion gap: 16 — ABNORMAL HIGH (ref 5–15)
BUN: 112 mg/dL — ABNORMAL HIGH (ref 8–23)
CO2: 17 mmol/L — ABNORMAL LOW (ref 22–32)
Calcium: 9 mg/dL (ref 8.9–10.3)
Chloride: 98 mmol/L (ref 98–111)
Creatinine, Ser: 3.69 mg/dL — ABNORMAL HIGH (ref 0.61–1.24)
GFR, Estimated: 17 mL/min — ABNORMAL LOW (ref >60)
Glucose, Bld: 165 mg/dL — ABNORMAL HIGH (ref 70–99)
Potassium: 5.3 mmol/L — ABNORMAL HIGH (ref 3.5–5.1)
Sodium: 131 mmol/L — ABNORMAL LOW (ref 135–145)
Total Bilirubin: 1.5 mg/dL — ABNORMAL HIGH (ref 0.0–1.2)
Total Protein: 7.8 g/dL (ref 6.5–8.1)

## 2023-07-31 LAB — URINALYSIS, W/ REFLEX TO CULTURE (INFECTION SUSPECTED)
Bilirubin Urine: NEGATIVE
Glucose, UA: NEGATIVE mg/dL
Ketones, ur: NEGATIVE mg/dL
Nitrite: POSITIVE — AB
Protein, ur: 30 mg/dL — AB
Specific Gravity, Urine: 1.008 (ref 1.005–1.030)
Squamous Epithelial / HPF: 0 /[HPF] (ref 0–5)
WBC, UA: >50 WBC/hpf (ref 0–5)
pH: 5 (ref 5.0–8.0)

## 2023-07-31 LAB — RESP PANEL BY RT-PCR (RSV, FLU A&B, COVID)  RVPGX2
Influenza A by PCR: NEGATIVE
Influenza B by PCR: NEGATIVE
Resp Syncytial Virus by PCR: NEGATIVE
SARS Coronavirus 2 by RT PCR: NEGATIVE

## 2023-07-31 LAB — CBC WITH DIFFERENTIAL/PLATELET
Abs Immature Granulocytes: 0.25 10*3/uL — ABNORMAL HIGH (ref 0.00–0.07)
Basophils Absolute: 0 10*3/uL (ref 0.0–0.1)
Basophils Relative: 0 %
Eosinophils Absolute: 0 10*3/uL (ref 0.0–0.5)
Eosinophils Relative: 0 %
HCT: 37.5 % — ABNORMAL LOW (ref 39.0–52.0)
Hemoglobin: 12.7 g/dL — ABNORMAL LOW (ref 13.0–17.0)
Immature Granulocytes: 2 %
Lymphocytes Relative: 6 %
Lymphs Abs: 1 10*3/uL (ref 0.7–4.0)
MCH: 30.4 pg (ref 26.0–34.0)
MCHC: 33.9 g/dL (ref 30.0–36.0)
MCV: 89.7 fL (ref 80.0–100.0)
Monocytes Absolute: 1 10*3/uL (ref 0.1–1.0)
Monocytes Relative: 7 %
Neutro Abs: 13.4 10*3/uL — ABNORMAL HIGH (ref 1.7–7.7)
Neutrophils Relative %: 85 %
Platelets: 453 10*3/uL — ABNORMAL HIGH (ref 150–400)
RBC: 4.18 MIL/uL — ABNORMAL LOW (ref 4.22–5.81)
RDW: 14.3 % (ref 11.5–15.5)
WBC: 15.8 10*3/uL — ABNORMAL HIGH (ref 4.0–10.5)
nRBC: 0 % (ref 0.0–0.2)

## 2023-07-31 LAB — MRSA NEXT GEN BY PCR, NASAL: MRSA by PCR Next Gen: NOT DETECTED

## 2023-07-31 LAB — PROTIME-INR
INR: 2.4 — ABNORMAL HIGH (ref 0.8–1.2)
Prothrombin Time: 26.7 s — ABNORMAL HIGH (ref 11.4–15.2)

## 2023-07-31 LAB — T4, FREE: Free T4: 1.13 ng/dL — ABNORMAL HIGH (ref 0.61–1.12)

## 2023-07-31 LAB — GLUCOSE, CAPILLARY
Glucose-Capillary: 135 mg/dL — ABNORMAL HIGH (ref 70–99)
Glucose-Capillary: 129 mg/dL — ABNORMAL HIGH (ref 70–99)

## 2023-07-31 LAB — LACTIC ACID, PLASMA
Lactic Acid, Venous: 1.6 mmol/L (ref 0.5–1.9)
Lactic Acid, Venous: 1.9 mmol/L (ref 0.5–1.9)

## 2023-07-31 LAB — TSH: TSH: 0.901 u[IU]/mL (ref 0.350–4.500)

## 2023-07-31 LAB — BLOOD GAS, VENOUS

## 2023-07-31 MED ORDER — ONDANSETRON HCL 4 MG/2ML IJ SOLN: 4.0000 mg | Freq: Four times a day (QID) | INTRAMUSCULAR | Status: DC | PRN

## 2023-07-31 MED ORDER — NOREPINEPHRINE 4 MG/250ML-% IV SOLN
2.0000 ug/min | INTRAVENOUS | Status: DC
  Administered 2023-07-31: 2 ug/min via INTRAVENOUS
  Administered 2023-08-01: 5 ug/min via INTRAVENOUS
  Administered 2023-08-01: 6 ug/min via INTRAVENOUS
  Administered 2023-08-02: 7 ug/min via INTRAVENOUS
  Administered 2023-08-02: 10 ug/min via INTRAVENOUS
  Filled 2023-07-31 (×4): qty 250

## 2023-07-31 MED ORDER — VANCOMYCIN HCL IN DEXTROSE 1-5 GM/200ML-% IV SOLN
1000.0000 mg | Freq: Once | INTRAVENOUS | Status: AC
  Administered 2023-07-31: 1000 mg via INTRAVENOUS
  Filled 2023-07-31: qty 200

## 2023-07-31 MED ORDER — ACETAMINOPHEN 325 MG PO TABS
650.0000 mg | ORAL_TABLET | ORAL | Status: DC | PRN
  Administered 2023-07-31 – 2023-08-01 (×2): 650 mg via ORAL
  Filled 2023-07-31 (×2): qty 2

## 2023-07-31 MED ORDER — POLYETHYLENE GLYCOL 3350 17 G PO PACK: 17.0000 g | PACK | Freq: Every day | ORAL | Status: DC | PRN

## 2023-07-31 MED ORDER — SODIUM CHLORIDE 0.9 % IV SOLN
2.0000 g | Freq: Once | INTRAVENOUS | Status: AC
  Administered 2023-07-31: 2 g via INTRAVENOUS
  Filled 2023-07-31: qty 12.5

## 2023-07-31 MED ORDER — INSULIN ASPART 100 UNIT/ML IJ SOLN
0.0000 [IU] | INTRAMUSCULAR | Status: DC
  Administered 2023-07-31 – 2023-08-01 (×2): 1 [IU] via SUBCUTANEOUS
  Administered 2023-08-01 (×3): 3 [IU] via SUBCUTANEOUS
  Administered 2023-08-02: 2 [IU] via SUBCUTANEOUS
  Administered 2023-08-02: 3 [IU] via SUBCUTANEOUS
  Administered 2023-08-02: 1 [IU] via SUBCUTANEOUS
  Administered 2023-08-02: 2 [IU] via SUBCUTANEOUS
  Administered 2023-08-03 (×2): 3 [IU] via SUBCUTANEOUS
  Administered 2023-08-03: 2 [IU] via SUBCUTANEOUS
  Filled 2023-07-31 (×14): qty 1

## 2023-07-31 MED ORDER — NOREPINEPHRINE 4 MG/250ML-% IV SOLN
0.0000 ug/min | INTRAVENOUS | Status: DC
  Administered 2023-07-31: 2 ug/min via INTRAVENOUS
  Filled 2023-07-31: qty 250

## 2023-07-31 MED ORDER — DEXTROSE 50 % IV SOLN
1.0000 | Freq: Once | INTRAVENOUS | Status: AC
  Administered 2023-07-31: 50 mL via INTRAVENOUS
  Filled 2023-07-31: qty 50

## 2023-07-31 MED ORDER — LACTATED RINGERS IV BOLUS (SEPSIS)
1000.0000 mL | Freq: Once | INTRAVENOUS | Status: AC
  Administered 2023-07-31: 1000 mL via INTRAVENOUS

## 2023-07-31 MED ORDER — PIPERACILLIN-TAZOBACTAM 3.375 G IVPB
3.3750 g | Freq: Three times a day (TID) | INTRAVENOUS | Status: DC
  Administered 2023-07-31: 3.375 g via INTRAVENOUS
  Filled 2023-07-31: qty 50

## 2023-07-31 MED ORDER — VANCOMYCIN VARIABLE DOSE PER UNSTABLE RENAL FUNCTION (PHARMACIST DOSING): Status: DC

## 2023-07-31 MED ORDER — DEXMEDETOMIDINE HCL IN NACL 400 MCG/100ML IV SOLN
0.0000 ug/kg/h | INTRAVENOUS | Status: DC
  Administered 2023-07-31: 1 ug/kg/h via INTRAVENOUS
  Administered 2023-07-31: 0.4 ug/kg/h via INTRAVENOUS
  Administered 2023-08-01 (×2): 1.2 ug/kg/h via INTRAVENOUS
  Administered 2023-08-02 (×2): 0.4 ug/kg/h via INTRAVENOUS
  Administered 2023-08-03: 0.2 ug/kg/h via INTRAVENOUS
  Filled 2023-07-31 (×7): qty 100

## 2023-07-31 MED ORDER — SODIUM CHLORIDE 0.9 % IV SOLN
250.0000 mL | INTRAVENOUS | Status: AC
  Administered 2023-07-31: 250 mL via INTRAVENOUS

## 2023-07-31 MED ORDER — CHLORHEXIDINE GLUCONATE CLOTH 2 % EX PADS: 6.0000 | MEDICATED_PAD | Freq: Every day | CUTANEOUS | Status: DC

## 2023-07-31 MED ORDER — METRONIDAZOLE 500 MG/100ML IV SOLN
500.0000 mg | Freq: Once | INTRAVENOUS | Status: AC
  Administered 2023-07-31: 500 mg via INTRAVENOUS
  Filled 2023-07-31: qty 100

## 2023-07-31 MED ORDER — INSULIN ASPART 100 UNIT/ML IV SOLN
10.0000 [IU] | Freq: Once | INTRAVENOUS | Status: AC
  Administered 2023-07-31: 10 [IU] via INTRAVENOUS
  Filled 2023-07-31: qty 0.1

## 2023-07-31 MED ORDER — DOCUSATE SODIUM 100 MG PO CAPS: 100.0000 mg | ORAL_CAPSULE | Freq: Two times a day (BID) | ORAL | Status: DC | PRN

## 2023-07-31 NOTE — ED Notes (Signed)
Attempted to get rectal temp. Pt refusing. Unable to obtain oral temp. Oral temp not registering on thermometer.

## 2023-07-31 NOTE — ED Notes (Signed)
Attempted IV access again. No success.

## 2023-07-31 NOTE — Sepsis Progress Note (Signed)
 Sepsis protocol is being followed by [facility].

## 2023-07-31 NOTE — ED Provider Notes (Signed)
Jeremy [name] Provider Note    Event Date/Time   First MD Initiated Contact with Patient 07/31/23 1149     (approximate)   History   Failure To Thrive   HPI  Jeremy [name] is a 68 y.o. male history of HF, CABG, diabetes, history of PE on Xarelto, presenting with hypotension.  [facility], per staff was confused for the last couple days, has had decreased p.o. intake.  Urine smells very strong.  Per jail nurse, patient had bacteria in his urine in the dip.  Patient denies any chest pain or shortness of breath.  No abdominal pain, no nausea, vomiting, diarrhea, denies dysuria.  States that he does have some body aches all over.   Independent history obtained from PD  On independent chart review patient was admitted in mid January for palpitations and cough.  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, echo noted EF of 35 to 40%     Physical Exam   Triage Vital Signs: ED Triage Vitals  Encounter Vitals Group     BP 07/31/23 1124 (!) 76/48     Systolic BP Percentile --      Diastolic BP Percentile --      Pulse Rate 07/31/23 1124 (!) 111     Resp 07/31/23 1124 19     Temp 07/31/23 1124 (!) 97.2 F (36.2 C)     Temp Source 07/31/23 1124 Axillary     SpO2 --      Weight 07/31/23 1120 220 lb (99.8 kg)     Height 07/31/23 1120 5\' 11"  (1.803 m)     Head Circumference --      Peak Flow --      Pain Score 07/31/23 1120 0     Pain Loc --      Pain Education --      Exclude from Growth Chart --     Most recent vital signs: Vitals:   07/31/23 1430 07/31/23 1445  BP: (!) 76/55 (!) 72/58  Pulse:  90  Resp: (!) 21 17  Temp:    SpO2:  98%     General: Awake, no distress.  CV:  Good peripheral perfusion.  Resp:  Normal effort.  Clear Abd:  No distention.  Soft nontender Other:  No lower extremity edema, no obvious rash to his extremities   ED Results / Procedures / Treatments   Labs (all labs ordered are listed, but only abnormal  results are displayed) Labs Reviewed  COMPREHENSIVE METABOLIC PANEL - Abnormal; Notable for the following components:      Result Value   Sodium 131 (*)    Potassium 5.3 (*)    CO2 17 (*)    Glucose, Bld 165 (*)    BUN 112 (*)    Creatinine, Ser 3.69 (*)    Albumin 2.9 (*)    Total Bilirubin 1.5 (*)    GFR, Estimated 17 (*)    Anion gap 16 (*)    All other components within normal limits  CBC WITH DIFFERENTIAL/PLATELET - Abnormal; Notable for the following components:   WBC 15.8 (*)    RBC 4.18 (*)    Hemoglobin 12.7 (*)    HCT 37.5 (*)    Platelets 453 (*)    Neutro Abs 13.4 (*)    Abs Immature Granulocytes 0.25 (*)    All other components within normal limits  PROTIME-INR - Abnormal; Notable for the following components:   Prothrombin  Time 26.7 (*)    INR 2.4 (*)    All other components within normal limits  CBG MONITORING, ED - Abnormal; Notable for the following components:   Glucose-Capillary 144 (*)    All other components within normal limits  RESP PANEL BY RT-PCR (RSV, FLU A&B, COVID)  RVPGX2  CULTURE, BLOOD (ROUTINE X 2)  CULTURE, BLOOD (ROUTINE X 2)  RESPIRATORY PANEL BY PCR  LACTIC ACID, PLASMA  LACTIC ACID, PLASMA  URINALYSIS, W/ REFLEX TO CULTURE (INFECTION SUSPECTED)  BLOOD GAS, VENOUS     EKG  Sinus tachycardia, rate 102, normal QRS, prolonged QTc, baseline is wandering, T wave version to inferior leads, no ischemic ST elevation, not significant compared to prior   RADIOLOGY Chest x-ray on my interpretation without focal consolidation.   PROCEDURES:  Critical Care performed: Yes, see critical care procedure note(s)  .Critical Care  Performed by: Claybon Jabs, MD Authorized by: Claybon Jabs, MD   Critical care provider statement:    Critical care time (minutes):  40   Critical care was necessary to treat or prevent imminent or life-threatening deterioration of the following conditions:  Sepsis   Critical care was time spent personally by me  on the following activities:  Development of treatment plan with patient or surrogate, discussions with consultants, evaluation of patient's response to treatment, examination of patient, ordering and review of laboratory studies, ordering and review of radiographic studies, ordering and performing treatments and interventions, pulse oximetry, re-evaluation of patient's condition and review of old charts    MEDICATIONS ORDERED IN ED: Medications  metroNIDAZOLE (FLAGYL) IVPB 500 mg (500 mg Intravenous New Bag/Given 07/31/23 1440)  vancomycin (VANCOCIN) IVPB 1000 mg/200 mL premix (has no administration in time range)  norepinephrine (LEVOPHED) 4mg  in (0.016 mg/mL) premix infusion (5 mcg/min Intravenous Rate/Dose Change 07/31/23 1510)  docusate sodium (COLACE) capsule 100 mg (has no administration in time range)  polyethylene glycol (MIRALAX / GLYCOLAX) packet 17 g (has no administration in time range)  acetaminophen (TYLENOL) tablet 650 mg (has no administration in time range)  ondansetron (ZOFRAN) injection 4 mg (has no administration in time range)  insulin aspart (novoLOG) injection 0-9 Units (has no administration in time range)  lactated ringers bolus 1,000 mL (1,000 mLs Intravenous New Bag/Given 07/31/23 1351)    And  lactated ringers bolus 1,000 mL (1,000 mLs Intravenous New Bag/Given 07/31/23 1440)    And  lactated ringers bolus 1,000 mL (1,000 mLs Intravenous New Bag/Given 07/31/23 1352)  ceFEPIme (MAXIPIME) 2 g in sodium chloride 0.9 % 100 mL IVPB (0 g Intravenous Stopped 07/31/23 1440)     IMPRESSION / MDM / ASSESSMENT AND PLAN / ED COURSE  I reviewed the triage vital signs and the nursing notes.                              Differential diagnosis includes, but is not limited to, sepsis, UTI, pyelonephritis, viral illness such as flu, COVID, RSV, pneumonia, electrolyte derangements, ACS, dehydration.  Will get labs, EKG, troponin, chest x-ray, UA, sepsis fluid bolus placed,  empirically treated with IV antibiotics.  Given report of confusion and history of being on blood thinners, will get CT head to make sure there is no intracranial hemorrhage.  Patient's presentation is most consistent with acute presentation with potential threat to life or bodily function.  Patient found to be hypothermic, placed on Bair hugger.  Bedside ultrasound done, no B-lines on  exam, IVC is flat, the RV smaller than the LV, no obvious pericardial effusion.  His contractility appears diminished.  Consult ICU who will evaluate the patient and admit him.  He is admitted.  Clinical Course as of 07/31/23 1538  Sun Jul 31, 2023  1307 DG Chest 2 View IMPRESSION: No active cardiopulmonary disease.   [TT]  1422 Will add on levo while patient is getting fluids since he is still hypotensive.  Even though his systolic blood pressures are in the 70s to 80s, his maps ranged from 61-65. [TT]  1422 Independent review of labs, respiratory viral panel is negative, he has mild hyponatremia, potassium is elevated but there is hemolysis present, creatinine is elevated compared to prior, he has a leukocytosis.  Lactate is normal.  UA is pending.  Chest x-ray without focal consolidation. [TT]  1438 Patient's repeat lactate is still not elevated. [TT]  1504 Consult the ICU and they will admit the patient. [TT]    Clinical Course User Index [TT] Jodie Echevaria, Franchot Erichsen, MD     FINAL CLINICAL IMPRESSION(S) / ED DIAGNOSES   Final diagnoses:  Hypothermia, initial encounter  Hypotension, unspecified hypotension type  Altered mental status, unspecified altered mental status type     Rx / DC Orders   ED Discharge Orders     None        Note:  This document was prepared using Dragon voice recognition software and may include unintentional dictation errors.    Claybon Jabs, MD 07/31/23 [phone]

## 2023-07-31 NOTE — Progress Notes (Signed)
Patient is still refusing to have lab work drawn. Explained why we needed to recheck his potassium level, but he still refused. APP made aware.

## 2023-07-31 NOTE — ED Notes (Signed)
RN attempted to place IV's in both A/C with out success.

## 2023-07-31 NOTE — Consult Note (Signed)
Pharmacy Antibiotic Note  Jeremy [name] is a 68 y.o. male admitted on 07/31/2023 with sepsis. PMH significant for CAD s/p CABG x1, T2DM, HTN, AF on rivaroxaban, CHF, obesity, CKD3a, PE (06/26/2023). Per ED notes it was reported [facility] the jail the pt has had foul smelling urine, and bacteria was present in the urine dip stick test. Pharmacy has been consulted for vancomycin, Zosyn dosing.  Plan: Day 1 of antibiotics Patient received vancomycin 1000 mg IV x1 in the ED Give vancomycin 1000 mg IV x1 (to complete 12000 mg loading dose) Check random vanc level tomorrow given AKI (SCr 3.69, baseline 1.46 on 07/08/23)  Start Zosyn 3.375 g IV Q8H Continue to monitor renal function and follow culture results   Height: 5\' 11"  (180.3 cm) Weight: 99.8 kg (220 lb) IBW/kg (Calculated) : 75.3  Temp (24hrs), Avg:97.2 F (36.2 C), Min:97.2 F (36.2 C), Max:97.2 F (36.2 C)  Recent Labs  Lab 07/31/23 1124 07/31/23 1407  WBC 15.8*  --   CREATININE 3.69*  --   LATICACIDVEN 1.6 1.9    Estimated Creatinine Clearance: 23.7 mL/min (A) (by C-G formula based on SCr of 3.69 mg/dL (H)).    Allergies  Allergen Reactions   Amoxicillin Swelling   Keflex [Cephalexin] Rash    Antimicrobials this admission: 2/16 Cefepime x1 2/16 Metronidazole x1 2/16 Zosyn >>  2/16 Vancomycin >>   Dose adjustments this admission: N/A  Microbiology results: 2/16 BCx: IP 2/16 UCx: IP  2/16 MRSA PCR: ordered  Thank you for allowing pharmacy to be a part of this patient's care.  Celene Squibb, PharmD Clinical Pharmacist 07/31/2023 4:27 PM

## 2023-07-31 NOTE — Progress Notes (Signed)
Pt agitated requesting to leave Against Medical Advice.  However, pt is disoriented to time and situation.  He also initially stated he was going to the "[employer]  He than stated "I am at the [facility] He states I have "been dealing with this stuff all night."  He initially refused to attempt to answer the orientation questions.  Pt does NOT currently have the capacity to make the decision to leave the hospital.   He may require Involuntary Commitment if he attempts to leave the hospital.  Report given to oncoming Nurse Practitioner Webb Silversmith, NP.  Zada Girt, AGNP  Pulmonary/Critical Care Pager [phone] (please enter 7 digits) PCCM Consult Pager [phone] (please enter 7 digits)

## 2023-07-31 NOTE — Progress Notes (Signed)
Starting precedex per order, lab here to collect BMP. He refused. I asked her to return in an hour to see if the medication would help to settle him down.

## 2023-07-31 NOTE — Progress Notes (Addendum)
Educated and asked patient if I could swab his nose for the ordered respiratory viral panel. Patient agreeable. Upon insertion of swab into patient's nare, patient swatted and hit at this RN and and stated to "Get the f** off and back the f** up". Patient continued to make derogatory marks. This RN left the patient's room and notified Charge RN and NP [employer].

## 2023-07-31 NOTE — ED Notes (Signed)
Delay in 2nd lactic due to difficult stick.

## 2023-07-31 NOTE — ED Notes (Signed)
 Advised nurse that patient has ready bed

## 2023-07-31 NOTE — H&P (Signed)
NAME:  Jeremy [name], MRN:  [id number], DOB:  August 01, 1955, LOS: 0 ADMISSION DATE:  07/31/2023, CONSULTATION DATE: 07/31/2023 REFERRING MD: Dr. Jodie Echevaria, CHIEF COMPLAINT: Altered Mental Status    History of Present Illness:  This is a 68 yo male who presented to [facility] ER on 02/16 from [facility] with confusion and poor po intake for several days.  Per ED notes it was reported by the nurse from the jail the pt has had foul smelling urine, and bacteria was present in the urine dip stick test.    ED Course  Upon arrival to the ER pt hypotensive, hypothermic, and tachycardic.  Significant lab results were: Na+ 131/K+ 5.3/CO2 17/glucose 165/BUN 112/creatinine 3.69/anion gap 16/albumin 2.9/wbc 15.8/hgb 12.7/platelet count 453/PT 26.7/INR 2.4.  Pt met sepsis criteria and received: 3L LR bolus/cefepime/metronidazole/vancomycin.  However, he remained hypotensive requiring levophed gtt.  Resp panel by RT-PCR and CXR negative. PCCM team contacted for ICU admission.    Pertinent  Medical History  CAD s/p CABG x1  Type II Diabetes Mellitus HTN  Paroxysmal Atrial Fibrillation (on xarelto)  Chronic Systolic CHF  Obesity  CKD stage 3a  DVT PE (dx: 06/26/23) Ischemic Cardiomyopathy (Echo 06/27/23: EF 35 to 40%; trivial mitral valve regurgitation; mild to moderate aortic valve regurgitation)  Micro Data:  02/16: Resp panel by RT-PCR>>negative 02/16: Blood x2>> 02/16: MRSA PCR>> 02/16: RVP>>  Anti-infectives (From admission, onward)    Start     Dose/Rate Route Frequency Ordered Stop   07/31/23 1215  ceFEPIme (MAXIPIME) 2 g in sodium chloride 0.9 % 100 mL IVPB        2 g 200 mL/hr over 30 Minutes Intravenous  Once 07/31/23 1213 07/31/23 1440   07/31/23 1215  metroNIDAZOLE (FLAGYL) IVPB 500 mg        500 mg 100 mL/hr over 60 Minutes Intravenous  Once 07/31/23 1213 07/31/23 1540   07/31/23 1215  vancomycin (VANCOCIN) IVPB 1000 mg/200 mL premix        1,000 mg 200 mL/hr over 60 Minutes  Intravenous  Once 07/31/23 1213        Significant Hospital Events: Including procedures, antibiotic start and stop dates in addition to other pertinent events   02/16: Pt admitted with acute metabolic encephalopathy and septic/hypovolemic shock suspected secondary to UTI and poor po intake requiring levophed gtt   Interim History / Subjective:  Currently requiring levophed gtt @5  mcg/min due to hypotension.  Pt states "I stopped eating that day old food"   Objective   Blood pressure (!) 79/58, pulse (!) 105, temperature (!) 97.2 F (36.2 C), temperature source Axillary, resp. rate (!) 36, height 5\' 11"  (1.803 m), weight 99.8 kg, SpO2 95%.       No intake or output data in the 24 hours ending 07/31/23 1432 Filed Weights   07/31/23 1120  Weight: 99.8 kg   Examination: General: Acutely-ill appearing male, quite tearful, NAD on RA  HENT: Very poor dentition, no JVD  Lungs: Clear throughout, even, non labored  Cardiovascular: Sinus tachycardia with depressed T wave, s1s2, no r/g, 2+ radial/2+ distal pulses, no edema  Abdomen: +BS x4, obese, soft, non tender, non distended  Extremities: Normal bulk and tone, moves all extremities  Skin: Intact no rashes, lesions, or pressure injuries present  Neuro: Alert, oriented to self/time disoriented to situation, following commands, PERRLA  GU: Deferred   Resolved Hospital Problem list    Assessment & Plan:   #Acute metabolic encephalopathy  - CT Head pending  -  Correct metabolic derangements - Maintain sleep/wake cycle - Avoid sedating medication as able  #Hypotension: hypovolemic and septic shock  #Chronic systolic CHF  Hx: Ischemic cardiomyopathy, PE, DVT, paroxysmal atrial fibrillation, CAD s/p CABG x1, and HTN Echo 06/27/23:  EF 35 to 40%; trivial mitral valve regurgitation; mild to moderate aortic valve regurgitation - Continuous telemetry monitoring  - Hold outpatient beta-blocker and diuretic therapy for now  - Hold  outpatient xarelto for now; will start heparin gtt if CT Head negative for acute bleed  - Prn levophed gtt to maintain map 65 or higher  - Cautious iv fluid resuscitation  - Thyroid panel and cortisol level pending   #Acute kidney injury superimposed on CKD stage 3A secondary to ATN  #Hyperkalemia  #Hyponatremia  #Anion gap metabolic acidosis  - Trend BMP and vbg  - Replace electrolytes as indicated  - Strict I's&O's - Avoid nephrotoxic agents as able  - Will administer shifting measures for hyperkalemia   #Sepsis suspect urinary source  - Trend WBC and monitor fever curve  - Trend PCT  - Follow cultures  - CT Abd Pelvis pending  - Will start empiric vancomycin and zosyn pending culture results and sensitivities   #Type II diabetes mellitus  - Hemoglobin A1c pending  - CBG's q4hrs  - SSI - Follow hyper/hypoglycemic protocol  - Target CBG 140 to 180   Best Practice (right click and "Reselect all SmartList Selections" daily)   Diet/type: NPO  DVT prophylaxis SCD's Pressure ulcer(s): N/A GI prophylaxis: N/A Lines: N/A Foley:  N/A Code Status:  full code Last date of multidisciplinary goals of care discussion [N/A]  02/16: Pt updated regarding plan of care and all questions answered  Labs   CBC: Recent Labs  Lab 07/31/23 1124  WBC 15.8*  NEUTROABS 13.4*  HGB 12.7*  HCT 37.5*  MCV 89.7  PLT 453*    Basic Metabolic Panel: Recent Labs  Lab 07/31/23 1124  NA 131*  K 5.3*  CL 98  CO2 17*  GLUCOSE 165*  BUN 112*  CREATININE 3.69*  CALCIUM 9.0   GFR: Estimated Creatinine Clearance: 23.7 mL/min (A) (by C-G formula based on SCr of 3.69 mg/dL (H)). Recent Labs  Lab 07/31/23 1124 07/31/23 1407  WBC 15.8*  --   LATICACIDVEN 1.6 1.9    Liver Function Tests: Recent Labs  Lab 07/31/23 1124  AST 19  ALT 20  ALKPHOS 72  BILITOT 1.5*  PROT 7.8  ALBUMIN 2.9*   No results for input(s): "LIPASE", "AMYLASE" in the last 168 hours. No results for input(s):  "AMMONIA" in the last 168 hours.  ABG    Component Value Date/Time   PHART 7.30 (L) 12/25/2020 1745   PCO2ART 40 12/25/2020 1745   PO2ART 82 (L) 12/25/2020 1745   HCO3 19.7 (L) 12/25/2020 1745   ACIDBASEDEF 6.3 (H) 12/25/2020 1745   O2SAT 94.7 12/25/2020 1745     Coagulation Profile: Recent Labs  Lab 07/31/23 1124  INR 2.4*    Cardiac Enzymes: No results for input(s): "CKTOTAL", "CKMB", "CKMBINDEX", "TROPONINI" in the last 168 hours.  HbA1C: Hemoglobin A1C  Date/Time Value Ref Range Status  10/03/2011 05:33 AM 6.2 4.2 - 6.3 % Final    Comment:    The American Diabetes Association recommends that a primary goal of therapy should be <7% and that physicians should reevaluate the treatment regimen in patients with HbA1c values consistently >8%.    Hgb A1c MFr Bld  Date/Time Value Ref Range Status  06/26/2023  11:51 AM 6.4 (H) 4.8 - 5.6 % Final    Comment:    (NOTE) Pre diabetes:          5.7%-6.4%  Diabetes:              >6.4%  Glycemic control for   <7.0% adults with diabetes   11/25/2022 05:23 AM 6.0 (H) 4.8 - 5.6 % Final    Comment:    (NOTE) Pre diabetes:          5.7%-6.4%  Diabetes:              >6.4%  Glycemic control for   <7.0% adults with diabetes     CBG: Recent Labs  Lab 07/31/23 1337  GLUCAP 144*    Review of Systems: Positives in BOLD   Gen: poor po intake fever, chills, weight change, fatigue, night sweats HEENT: Denies blurred vision, double vision, hearing loss, tinnitus, sinus congestion, rhinorrhea, sore throat, neck stiffness, dysphagia PULM: Denies shortness of breath, cough, sputum production, hemoptysis, wheezing CV: Denies chest pain, edema, orthopnea, paroxysmal nocturnal dyspnea, palpitations GI: Denies abdominal pain, nausea, vomiting, diarrhea, hematochezia, melena, constipation, change in bowel habits GU: Denies dysuria, hematuria, polyuria, oliguria, urethral discharge Endocrine: Denies hot or cold intolerance, polyuria,  polyphagia or appetite change Derm: Denies rash, dry skin, scaling or peeling skin change Heme: Denies easy bruising, bleeding, bleeding gums Neuro: confusion, headache, numbness, weakness, slurred speech, loss of memory or consciousness  Past Medical History:  He,  has a past medical history of Coronary artery disease, Diabetes mellitus without complication (HCC), Hypertension, and S/P CABG x 1.   Surgical History:   Past Surgical History:  Procedure Laterality Date   ANKLE ARTHROSCOPY W/ OPEN REPAIR Right    CORONARY ARTERY BYPASS GRAFT     PULMONARY THROMBECTOMY Bilateral 12/24/2020   Procedure: PULMONARY THROMBECTOMY;  Surgeon: Annice Needy, MD;  Location: [facility] INVASIVE CV LAB;  Service: Cardiovascular;  Laterality: Bilateral;     Social History:   reports that he has been smoking cigarettes. He has never used smokeless tobacco. He reports current alcohol use of about 3.0 standard drinks of alcohol per week. He reports that he does not currently use drugs after having used the following drugs: Marijuana.   Family History:  His family history includes Heart attack in his father.   Allergies Allergies  Allergen Reactions   Amoxicillin Swelling   Keflex [Cephalexin] Rash     Home Medications  Prior to Admission medications   Medication Sig Start Date End Date Taking? Authorizing Provider  acetaminophen (TYLENOL) 500 MG tablet Take 2 tablets (1,000 mg total) by mouth every 8 (eight) hours as needed for moderate pain. 12/11/22 12/11/23  [name], MD  amiodarone (PACERONE) 200 MG tablet Take 1 tablet (200 mg total) by mouth daily. 06/28/23   Sunnie Nielsen, DO  aspirin 81 MG chewable tablet Chew 1 tablet (81 mg total) by mouth daily. 06/28/23   Sunnie Nielsen, DO  atorvastatin (LIPITOR) 40 MG tablet Take 1 tablet (40 mg total) by mouth daily. 06/28/23   Sunnie Nielsen, DO  carvedilol (COREG) 6.25 MG tablet Take 1 tablet (6.25 mg total) by mouth 2 (two) times daily  with meals. 06/28/23   Sunnie Nielsen, DO  doxycycline (VIBRA-TABS) 100 MG tablet Take 1 tablet (100 mg total) by mouth 2 (two) times daily. 06/28/23   Sunnie Nielsen, DO  metFORMIN (GLUCOPHAGE) 1000 MG tablet Take 1 tablet (1,000 mg total) by mouth once daily with breakfast. 06/28/23  Sunnie Nielsen, DO  nitroGLYCERIN (NITROSTAT) 0.4 MG SL tablet Place 1 tablet (0.4 mg total) under the tongue every 5 (five) minutes as needed for chest pain. 11/26/22   Sunnie Nielsen, DO  pantoprazole (PROTONIX) 20 MG tablet Take 1 tablet (20 mg total) by mouth daily. 06/28/23   Sunnie Nielsen, DO  rivaroxaban (XARELTO) 20 MG TABS tablet Take 1 tablet (20 mg total) by mouth daily at 5 pm 07/20/23 08/19/23  Sunnie Nielsen, DO  RIVAROXABAN Carlena Hurl) VTE STARTER PACK (15 & 20 MG) Take 15 mg by mouth 2 (two) times daily with a meal for 21 days, then take 20mg  by mouth once daily on days 22-30. 06/28/23   Sunnie Nielsen, DO  sacubitril-valsartan (ENTRESTO) 24-26 MG Take 1 tablet by mouth every 12 (twelve) hours. 06/28/23   Sunnie Nielsen, DO  torsemide (DEMADEX) 20 MG tablet Take 1 tablet (20 mg total) by mouth daily. 06/28/23   Sunnie Nielsen, DO  traMADol (ULTRAM) 50 MG tablet Take 1 tablet (50 mg total) by mouth daily as needed for pain. 06/03/23        Critical care time: 65 minutes      Zada Girt, AGNP  Pulmonary/Critical Care Pager [phone] (please enter 7 digits) PCCM Consult Pager [phone] (please enter 7 digits)

## 2023-07-31 NOTE — ED Triage Notes (Signed)
Pt arrived from the [facility] jail. Pt has been confused for the last several days and has not been eating. Pt smells of urine. Per [facility] nurse cindy, pt has bacteria in his urine.

## 2023-07-31 NOTE — Consult Note (Signed)
CODE SEPSIS - PHARMACY COMMUNICATION  **Broad Spectrum Antibiotics should be administered within 1 hour of Sepsis diagnosis**  Time Code Sepsis Called/Page Received: 1213  Antibiotics Ordered: vancomycin, cefepime, metronidazole  Time of 1st antibiotic administration: 1349  Additional action taken by pharmacy: Per RN, difficultly obtaining IV access is delaying antibiotic administration   If necessary, Name of Provider/Nurse Contacted: Edson Snowball, PharmD Pharmacy Resident  07/31/2023 12:14 PM

## 2023-07-31 NOTE — ED Notes (Signed)
Pt de-sating down into the upper 80's currently at 88% on RA. Pt placed on 2L of O2.

## 2023-07-31 NOTE — ED Notes (Signed)
This RN at bedside. pt making derogatory remarks towards this RN.

## 2023-07-31 NOTE — ED Notes (Signed)
Pt extremities cold to the touch. Pulse ox moved from finger to ear with no successful readings. Pt placed on nasal pulse ox. Sating at 100% on RA. Ractal temp taken at 94.9. Md made aware. Bear hugger applied.

## 2023-07-31 NOTE — ED Notes (Signed)
Pt stuck x6 times with no success. IV team consult placed.

## 2023-07-31 NOTE — ED Notes (Signed)
 IV team at bedside

## 2023-08-01 ENCOUNTER — Other Ambulatory Visit: Payer: Self-pay

## 2023-08-01 DIAGNOSIS — G9341 Metabolic encephalopathy: Secondary | ICD-10-CM | POA: Diagnosis not present

## 2023-08-01 DIAGNOSIS — A4151 Sepsis due to Escherichia coli [E. coli]: Secondary | ICD-10-CM | POA: Diagnosis not present

## 2023-08-01 DIAGNOSIS — R6521 Severe sepsis with septic shock: Secondary | ICD-10-CM | POA: Diagnosis not present

## 2023-08-01 LAB — BLOOD CULTURE ID PANEL (REFLEXED) - BCID2

## 2023-08-01 LAB — RESPIRATORY PANEL BY PCR

## 2023-08-01 LAB — CBC
HCT: 34.2 % — ABNORMAL LOW (ref 39.0–52.0)
Hemoglobin: 11.9 g/dL — ABNORMAL LOW (ref 13.0–17.0)
MCH: 31.2 pg (ref 26.0–34.0)
MCHC: 34.8 g/dL (ref 30.0–36.0)
MCV: 89.8 fL (ref 80.0–100.0)
Platelets: 533 10*3/uL — ABNORMAL HIGH (ref 150–400)
RBC: 3.81 MIL/uL — ABNORMAL LOW (ref 4.22–5.81)
RDW: 14 % (ref 11.5–15.5)
WBC: 17.4 10*3/uL — ABNORMAL HIGH (ref 4.0–10.5)
nRBC: 0 % (ref 0.0–0.2)

## 2023-08-01 LAB — BASIC METABOLIC PANEL
Anion gap: 12 (ref 5–15)
Anion gap: 14 (ref 5–15)
BUN: 93 mg/dL — ABNORMAL HIGH (ref 8–23)
BUN: 93 mg/dL — ABNORMAL HIGH (ref 8–23)
CO2: 16 mmol/L — ABNORMAL LOW (ref 22–32)
CO2: 17 mmol/L — ABNORMAL LOW (ref 22–32)
Calcium: 8.6 mg/dL — ABNORMAL LOW (ref 8.9–10.3)
Calcium: 8.6 mg/dL — ABNORMAL LOW (ref 8.9–10.3)
Chloride: 104 mmol/L (ref 98–111)
Chloride: 104 mmol/L (ref 98–111)
Creatinine, Ser: 2.98 mg/dL — ABNORMAL HIGH (ref 0.61–1.24)
Creatinine, Ser: 3.09 mg/dL — ABNORMAL HIGH (ref 0.61–1.24)
GFR, Estimated: 21 mL/min — ABNORMAL LOW (ref >60)
GFR, Estimated: 22 mL/min — ABNORMAL LOW (ref >60)
Glucose, Bld: 226 mg/dL — ABNORMAL HIGH (ref 70–99)
Glucose, Bld: 231 mg/dL — ABNORMAL HIGH (ref 70–99)
Potassium: 4.9 mmol/L (ref 3.5–5.1)
Potassium: 5 mmol/L (ref 3.5–5.1)
Sodium: 133 mmol/L — ABNORMAL LOW (ref 135–145)
Sodium: 134 mmol/L — ABNORMAL LOW (ref 135–145)

## 2023-08-01 LAB — PHOSPHORUS: Phosphorus: 3.9 mg/dL (ref 2.5–4.6)

## 2023-08-01 LAB — GLUCOSE, CAPILLARY
Glucose-Capillary: 137 mg/dL — ABNORMAL HIGH (ref 70–99)
Glucose-Capillary: 203 mg/dL — ABNORMAL HIGH (ref 70–99)
Glucose-Capillary: 218 mg/dL — ABNORMAL HIGH (ref 70–99)
Glucose-Capillary: 221 mg/dL — ABNORMAL HIGH (ref 70–99)
Glucose-Capillary: 83 mg/dL (ref 70–99)
Glucose-Capillary: 88 mg/dL (ref 70–99)

## 2023-08-01 LAB — CORTISOL: Cortisol, Plasma: 36.9 ug/dL

## 2023-08-01 LAB — PROCALCITONIN: Procalcitonin: 2.77 ng/mL

## 2023-08-01 LAB — MAGNESIUM: Magnesium: 2.5 mg/dL — ABNORMAL HIGH (ref 1.7–2.4)

## 2023-08-01 MED ORDER — QUETIAPINE FUMARATE 25 MG PO TABS
50.0000 mg | ORAL_TABLET | Freq: Two times a day (BID) | ORAL | Status: DC
  Administered 2023-08-01 – 2023-08-04 (×7): 50 mg via ORAL
  Filled 2023-08-01 (×7): qty 2

## 2023-08-01 MED ORDER — CHLORHEXIDINE GLUCONATE CLOTH 2 % EX PADS
6.0000 | MEDICATED_PAD | Freq: Every day | CUTANEOUS | Status: DC
  Administered 2023-08-01 – 2023-08-04 (×4): 6 via TOPICAL

## 2023-08-01 MED ORDER — DIAZEPAM 5 MG/ML IJ SOLN
5.0000 mg | Freq: Four times a day (QID) | INTRAMUSCULAR | Status: DC | PRN
  Administered 2023-08-01 (×2): 5 mg via INTRAVENOUS
  Filled 2023-08-01 (×2): qty 2

## 2023-08-01 MED ORDER — RIVAROXABAN 15 MG PO TABS
15.0000 mg | ORAL_TABLET | Freq: Every day | ORAL | Status: DC
  Administered 2023-08-01 – 2023-08-03 (×3): 15 mg via ORAL
  Filled 2023-08-01 (×3): qty 1

## 2023-08-01 MED ORDER — LACTATED RINGERS IV BOLUS
500.0000 mL | Freq: Once | INTRAVENOUS | Status: AC
  Administered 2023-08-01: 500 mL via INTRAVENOUS

## 2023-08-01 MED ORDER — SODIUM CHLORIDE 0.9 % IV SOLN
1.0000 g | Freq: Two times a day (BID) | INTRAVENOUS | Status: DC
  Administered 2023-08-01 – 2023-08-04 (×8): 1 g via INTRAVENOUS
  Filled 2023-08-01 (×8): qty 20

## 2023-08-01 NOTE — Progress Notes (Signed)
PHARMACY CONSULT NOTE - FOLLOW UP  Pharmacy Consult for Electrolyte Monitoring and Replacement   Recent Labs: Potassium (mmol/L)  Date Value  08/01/2023 4.9  08/01/2023 5.0  10/03/2011 3.8   Magnesium (mg/dL)  Date Value  13/01/6577 2.5 (H)   Calcium (mg/dL)  Date Value  46/96/2952 8.6 (L)  08/01/2023 8.6 (L)   Calcium, Total (mg/dL)  Date Value  84/13/2440 8.7   Albumin (g/dL)  Date Value  04/10/2535 2.9 (L)  10/02/2011 3.6   Phosphorus (mg/dL)  Date Value  64/40/3474 3.9   Sodium (mmol/L)  Date Value  08/01/2023 133 (L)  08/01/2023 134 (L)  10/03/2011 146 (H)   Assessment: Jeremy [name] is a 68 yo male who presented from jail with foul smelling urine and confusion with a history of poor oral intake. Pharmacy has been consulted to monitor with patient's electrolytes while in the ICU.   Fluids: NS @ 250 mL/hr Medications: NA Diet: Healthy Carb  Goal of Therapy:  Electrolytes WNL K = 5.0 Mg = 2.5 Phos = 3.9 Continue to monitor Na, patient arrived with low sodium levels   Plan:  No replacement indicated at this time Continue to monitor Na levels (receiving NS at this time) Continue to monitor electrolytes with AM labs   Effie Shy, PharmD Pharmacy Resident  08/01/2023 6:48 AM

## 2023-08-01 NOTE — Progress Notes (Addendum)
NAME:  Jeremy [name], MRN:  [id number], DOB:  02/03/1956, LOS: 1 ADMISSION DATE:  07/31/2023, CONSULTATION DATE: 07/31/2023 REFERRING MD: Dr. Jodie Echevaria, CHIEF COMPLAINT: Altered Mental Status    History of Present Illness:  This is a 68 yo male who presented to [facility] ER on 02/16 from [facility] with confusion and poor po intake for several days.  Per ED notes it was [facility] the pt has had foul smelling urine, and bacteria was present in the urine dip stick test.    ED Course  Upon arrival to the ER pt hypotensive, hypothermic, and tachycardic.  Significant lab results were: Na+ 131/K+ 5.3/CO2 17/glucose 165/BUN 112/creatinine 3.69/anion gap 16/albumin 2.9/wbc 15.8/hgb 12.7/platelet count 453/PT 26.7/INR 2.4.  Pt met sepsis criteria and received: 3L LR bolus/cefepime/metronidazole/vancomycin.  However, he remained hypotensive requiring levophed gtt.  Resp panel by RT-PCR and CXR negative. PCCM team contacted for ICU admission.    Pertinent  Medical History  CAD s/p CABG x1  Type II Diabetes Mellitus HTN  Paroxysmal Atrial Fibrillation (on xarelto)  Chronic Systolic CHF  Obesity  CKD stage 3a  DVT PE (dx: 06/26/23) Ischemic Cardiomyopathy (Echo 06/27/23: EF 35 to 40%; trivial mitral valve regurgitation; mild to moderate aortic valve regurgitation)  Micro Data:  02/16: Resp panel by RT-PCR>>negative 02/16: Blood x2>>enterobacterales, e.coli, CTX-M ESBL resistance detected  02/16: MRSA PCR>>negative 02/16: RVP>>  Anti-infectives (From admission, onward)    Start     Dose/Rate Route Frequency Ordered Stop   08/01/23 0200  meropenem (MERREM) 1 g in sodium chloride 0.9 % 100 mL IVPB        1 g 200 mL/hr over 30 Minutes Intravenous Every 12 hours 08/01/23 0107     07/31/23 2200  piperacillin-tazobactam (ZOSYN) IVPB 3.375 g  Status:  Discontinued        3.375 g 12.5 mL/hr over 240 Minutes Intravenous Every 8 hours 07/31/23 1633 08/01/23 0107   07/31/23  1645  vancomycin (VANCOCIN) IVPB 1000 mg/200 mL premix        1,000 mg 200 mL/hr over 60 Minutes Intravenous  Once 07/31/23 1633 07/31/23 1912   07/31/23 1628  vancomycin variable dose per unstable renal function (pharmacist dosing)  Status:  Discontinued         Does not apply See admin instructions 07/31/23 1633 08/01/23 0107   07/31/23 1215  ceFEPIme (MAXIPIME) 2 g in sodium chloride 0.9 % 100 mL IVPB        2 g 200 mL/hr over 30 Minutes Intravenous  Once 07/31/23 1213 07/31/23 1440   07/31/23 1215  metroNIDAZOLE (FLAGYL) IVPB 500 mg        500 mg 100 mL/hr over 60 Minutes Intravenous  Once 07/31/23 1213 07/31/23 1540   07/31/23 1215  vancomycin (VANCOCIN) IVPB 1000 mg/200 mL premix        1,000 mg 200 mL/hr over 60 Minutes Intravenous  Once 07/31/23 1213 07/31/23 1646      Significant Hospital Events: Including procedures, antibiotic start and stop dates in addition to other pertinent events   02/16: Pt admitted with acute metabolic encephalopathy and septic/hypovolemic shock suspected secondary to UTI and poor po intake requiring levophed gtt  02/16: Remains on Precedex gtt for agitation/confusion. Bcx growing ESBL Ecoli  Interim History / Subjective:  Currently requiring levophed gtt @5  mcg/min due to hypotension and precedex for agitation/confusion  Objective   Blood pressure (!) 110/55, pulse 70, temperature 98.7 F (37.1 C), temperature source Axillary,  resp. rate (!) 22, height 5\' 11"  (1.803 m), weight 97.1 kg, SpO2 100%.        Intake/Output Summary (Last 24 hours) at 08/01/2023 0405 Last data filed at 08/01/2023 0325 Gross per 24 hour  Intake 4430.77 ml  Output 975 ml  Net 3455.77 ml   Filed Weights   07/31/23 1120 07/31/23 1700  Weight: 99.8 kg 97.1 kg   Examination: General: Acutely-ill appearing male, quite tearful, NAD on RA  HENT: Very poor dentition, no JVD  Lungs: Clear throughout, even, non labored  Cardiovascular: Sinus tachycardia with depressed T  wave, s1s2, no r/g, 2+ radial/2+ distal pulses, no edema  Abdomen: +BS x4, obese, soft, non tender, non distended  Extremities: Normal bulk and tone, moves all extremities  Skin: Intact no rashes, lesions, or pressure injuries present  Neuro: Alert, oriented to self/time disoriented to situation, following commands, PERRLA  GU: Deferred   Resolved Hospital Problem list    Assessment & Plan:   #Acute metabolic encephalopathy  - CT Head negative - Correct metabolic derangements - Maintain sleep/wake cycle - Avoid sedating medication as able - Precedex gtt for agitation  #Hypotension: hypovolemic and septic shock  #Chronic systolic CHF  Hx: Ischemic cardiomyopathy, PE, DVT, paroxysmal atrial fibrillation, CAD s/p CABG x1, and HTN Echo 06/27/23:  EF 35 to 40%; trivial mitral valve regurgitation; mild to moderate aortic valve regurgitation - Continuous telemetry monitoring  - Hold outpatient beta-blocker and diuretic therapy for now  - Hold outpatient xarelto for now; will start heparin gtt if CT Head negative for acute bleed  - Prn levophed gtt to maintain map 65 or higher  - Cautious iv fluid resuscitation  - Thyroid panel and cortisol level unremarkable  #Acute kidney injury superimposed on CKD stage 3A secondary to ATN  #Hyperkalemia  #Hyponatremia  #Anion gap metabolic acidosis  - Trend BMP and vbg  - Replace electrolytes as indicated  - Strict I's&O's - Avoid nephrotoxic agents as able  - Will administer shifting measures for hyperkalemia   #Sepsis secondary to ESBL Ecoli Bacteremia #Septic Shock  - Trend WBC and monitor fever curve  - Trend PCT  - Follow cultures  - CT Abd Pelvis concerning for cystitis otherwise unremarkable - D/c empiric vancomycin and zosyn and start Meropenem  #Type II diabetes mellitus  - Hemoglobin A1c pending  - CBG's q4hrs  - SSI - Follow hyper/hypoglycemic protocol  - Target CBG 140 to 180   Best Practice (right click and "Reselect all  SmartList Selections" daily)   Diet/type: NPO  DVT prophylaxis SCD's Pressure ulcer(s): N/A GI prophylaxis: N/A Lines: N/A Foley:  N/A Code Status:  full code Last date of multidisciplinary goals of care discussion [N/A]  02/16: Pt updated regarding plan of care and all questions answered  Labs   CBC: Recent Labs  Lab 07/31/23 1124  WBC 15.8*  NEUTROABS 13.4*  HGB 12.7*  HCT 37.5*  MCV 89.7  PLT 453*    Basic Metabolic Panel: Recent Labs  Lab 07/31/23 1124  NA 131*  K 5.3*  CL 98  CO2 17*  GLUCOSE 165*  BUN 112*  CREATININE 3.69*  CALCIUM 9.0   GFR: Estimated Creatinine Clearance: 23.4 mL/min (A) (by C-G formula based on SCr of 3.69 mg/dL (H)). Recent Labs  Lab 07/31/23 1124 07/31/23 1407  WBC 15.8*  --   LATICACIDVEN 1.6 1.9    Liver Function Tests: Recent Labs  Lab 07/31/23 1124  AST 19  ALT 20  ALKPHOS 72  BILITOT 1.5*  PROT 7.8  ALBUMIN 2.9*   No results for input(s): "LIPASE", "AMYLASE" in the last 168 hours. No results for input(s): "AMMONIA" in the last 168 hours.  ABG    Component Value Date/Time   PHART 7.30 (L) 12/25/2020 1745   PCO2ART 40 12/25/2020 1745   PO2ART 82 (L) 12/25/2020 1745   HCO3 21.1 07/31/2023 1816   ACIDBASEDEF 5.9 (H) 07/31/2023 1816   O2SAT 29.3 07/31/2023 1816     Coagulation Profile: Recent Labs  Lab 07/31/23 1124  INR 2.4*    Cardiac Enzymes: No results for input(s): "CKTOTAL", "CKMB", "CKMBINDEX", "TROPONINI" in the last 168 hours.  HbA1C: Hemoglobin A1C  Date/Time Value Ref Range Status  10/03/2011 05:33 AM 6.2 4.2 - 6.3 % Final    Comment:    The American Diabetes Association recommends that a primary goal of therapy should be <7% and that physicians should reevaluate the treatment regimen in patients with HbA1c values consistently >8%.    Hgb A1c MFr Bld  Date/Time Value Ref Range Status  06/26/2023 11:51 AM 6.4 (H) 4.8 - 5.6 % Final    Comment:    (NOTE) Pre diabetes:           5.7%-6.4%  Diabetes:              >6.4%  Glycemic control for   <7.0% adults with diabetes   11/25/2022 05:23 AM 6.0 (H) 4.8 - 5.6 % Final    Comment:    (NOTE) Pre diabetes:          5.7%-6.4%  Diabetes:              >6.4%  Glycemic control for   <7.0% adults with diabetes     CBG: Recent Labs  Lab 07/31/23 1337 07/31/23 1622 07/31/23 1704 07/31/23 1942  GLUCAP 144* 148* 135* 129*    Review of Systems: Positives in BOLD   Gen: poor po intake fever, chills, weight change, fatigue, night sweats HEENT: Denies blurred vision, double vision, hearing loss, tinnitus, sinus congestion, rhinorrhea, sore throat, neck stiffness, dysphagia PULM: Denies shortness of breath, cough, sputum production, hemoptysis, wheezing CV: Denies chest pain, edema, orthopnea, paroxysmal nocturnal dyspnea, palpitations GI: Denies abdominal pain, nausea, vomiting, diarrhea, hematochezia, melena, constipation, change in bowel habits GU: Denies dysuria, hematuria, polyuria, oliguria, urethral discharge Endocrine: Denies hot or cold intolerance, polyuria, polyphagia or appetite change Derm: Denies rash, dry skin, scaling or peeling skin change Heme: Denies easy bruising, bleeding, bleeding gums Neuro: confusion, headache, numbness, weakness, slurred speech, loss of memory or consciousness  Past Medical History:  He,  has a past medical history of Coronary artery disease, Diabetes mellitus without complication (HCC), Hypertension, and S/P CABG x 1.   Surgical History:   Past Surgical History:  Procedure Laterality Date   ANKLE ARTHROSCOPY W/ OPEN REPAIR Right    CORONARY ARTERY BYPASS GRAFT     PULMONARY THROMBECTOMY Bilateral 12/24/2020   Procedure: PULMONARY THROMBECTOMY;  Surgeon: Annice Needy, MD;  Location: [facility] INVASIVE CV LAB;  Service: Cardiovascular;  Laterality: Bilateral;     Social History:   reports that he has been smoking cigarettes. He has never used smokeless tobacco. He reports  current alcohol use of about 3.0 standard drinks of alcohol per week. He reports that he does not currently use drugs after having used the following drugs: Marijuana.   Family History:  His family history includes Heart attack in his father.   Allergies No Active Allergies  Home Medications  Prior to Admission medications   Medication Sig Start Date End Date Taking? Authorizing Provider  acetaminophen (TYLENOL) 500 MG tablet Take 2 tablets (1,000 mg total) by mouth every 8 (eight) hours as needed for moderate pain. 12/11/22 12/11/23  [name], MD  amiodarone (PACERONE) 200 MG tablet Take 1 tablet (200 mg total) by mouth daily. 06/28/23   Sunnie Nielsen, DO  aspirin 81 MG chewable tablet Chew 1 tablet (81 mg total) by mouth daily. 06/28/23   Sunnie Nielsen, DO  atorvastatin (LIPITOR) 40 MG tablet Take 1 tablet (40 mg total) by mouth daily. 06/28/23   Sunnie Nielsen, DO  carvedilol (COREG) 6.25 MG tablet Take 1 tablet (6.25 mg total) by mouth 2 (two) times daily with meals. 06/28/23   Sunnie Nielsen, DO  doxycycline (VIBRA-TABS) 100 MG tablet Take 1 tablet (100 mg total) by mouth 2 (two) times daily. 06/28/23   Sunnie Nielsen, DO  metFORMIN (GLUCOPHAGE) 1000 MG tablet Take 1 tablet (1,000 mg total) by mouth once daily with breakfast. 06/28/23   Sunnie Nielsen, DO  nitroGLYCERIN (NITROSTAT) 0.4 MG SL tablet Place 1 tablet (0.4 mg total) under the tongue every 5 (five) minutes as needed for chest pain. 11/26/22   Sunnie Nielsen, DO  pantoprazole (PROTONIX) 20 MG tablet Take 1 tablet (20 mg total) by mouth daily. 06/28/23   Sunnie Nielsen, DO  rivaroxaban (XARELTO) 20 MG TABS tablet Take 1 tablet (20 mg total) by mouth daily at 5 pm 07/20/23 08/19/23  Sunnie Nielsen, DO  RIVAROXABAN Carlena Hurl) VTE STARTER PACK (15 & 20 MG) Take 15 mg by mouth 2 (two) times daily with a meal for 21 days, then take 20mg  by mouth once daily on days 22-30. 06/28/23   Sunnie Nielsen,  DO  sacubitril-valsartan (ENTRESTO) 24-26 MG Take 1 tablet by mouth every 12 (twelve) hours. 06/28/23   Sunnie Nielsen, DO  torsemide (DEMADEX) 20 MG tablet Take 1 tablet (20 mg total) by mouth daily. 06/28/23   Sunnie Nielsen, DO  traMADol (ULTRAM) 50 MG tablet Take 1 tablet (50 mg total) by mouth daily as needed for pain. 06/03/23     Scheduled Meds:  Chlorhexidine Gluconate Cloth  6 each Topical Q0600   insulin aspart  0-9 Units Subcutaneous Q4H   Continuous Infusions:  sodium chloride Stopped (07/31/23 1807)   dexmedetomidine (PRECEDEX) IV infusion 1.2 mcg/kg/hr (08/01/23 0325)   meropenem (MERREM) IV Stopped (08/01/23 0235)   norepinephrine (LEVOPHED) Adult infusion 6 mcg/min (08/01/23 0300)   PRN Meds:.acetaminophen, docusate sodium, ondansetron (ZOFRAN) IV, polyethylene glycol   Critical care time: 45 minutes      Webb Silversmith, DNP, CCRN, FNP-C, AGACNP-BC Acute Care & Family Nurse Practitioner  [facility]  See Amion for personal pager PCCM on call pager [phone] until 7 am

## 2023-08-01 NOTE — Progress Notes (Signed)
PHARMACY - PHYSICIAN COMMUNICATION CRITICAL VALUE ALERT - BLOOD CULTURE IDENTIFICATION (BCID)  Jeremy [name] is an 68 y.o. male who presented to [facility] on 07/31/2023 with a chief complaint of confusion and poor oral intake.  Assessment:  1/4(anaerobic) enterobacterales, e.coli, CTX-M ESBL resistance detected. (Likely urinary source)  Name of physician (or Provider) Contacted: Webb Silversmith, NP  Current antibiotics: Vancomycin, Zosyn  Changes to prescribed antibiotics recommended:  Recommendations accepted by provider Will stop vanc and zosyn, start on meropenem. Await susceptibilities.  Results for orders placed or performed during the hospital encounter of 07/31/23  Blood Culture ID Panel (Reflexed) (Collected: 07/31/2023 11:29 AM)  Result Value Ref Range   Enterococcus faecalis NOT DETECTED NOT DETECTED   Enterococcus Faecium NOT DETECTED NOT DETECTED   Listeria monocytogenes NOT DETECTED NOT DETECTED   Staphylococcus species NOT DETECTED NOT DETECTED   Staphylococcus aureus (BCID) NOT DETECTED NOT DETECTED   Staphylococcus epidermidis NOT DETECTED NOT DETECTED   Staphylococcus lugdunensis NOT DETECTED NOT DETECTED   Streptococcus species NOT DETECTED NOT DETECTED   Streptococcus agalactiae NOT DETECTED NOT DETECTED   Streptococcus pneumoniae NOT DETECTED NOT DETECTED   Streptococcus pyogenes NOT DETECTED NOT DETECTED   A.calcoaceticus-baumannii NOT DETECTED NOT DETECTED   Bacteroides fragilis NOT DETECTED NOT DETECTED   Enterobacterales DETECTED (A) NOT DETECTED   Enterobacter cloacae complex NOT DETECTED NOT DETECTED   Escherichia coli DETECTED (A) NOT DETECTED   Klebsiella aerogenes NOT DETECTED NOT DETECTED   Klebsiella oxytoca NOT DETECTED NOT DETECTED   Klebsiella pneumoniae NOT DETECTED NOT DETECTED   Proteus species NOT DETECTED NOT DETECTED   Salmonella species NOT DETECTED NOT DETECTED   Serratia marcescens NOT DETECTED NOT DETECTED   Haemophilus influenzae  NOT DETECTED NOT DETECTED   Neisseria meningitidis NOT DETECTED NOT DETECTED   Pseudomonas aeruginosa NOT DETECTED NOT DETECTED   Stenotrophomonas maltophilia NOT DETECTED NOT DETECTED   Candida albicans NOT DETECTED NOT DETECTED   Candida auris NOT DETECTED NOT DETECTED   Candida glabrata NOT DETECTED NOT DETECTED   Candida krusei NOT DETECTED NOT DETECTED   Candida parapsilosis NOT DETECTED NOT DETECTED   Candida tropicalis NOT DETECTED NOT DETECTED   Cryptococcus neoformans/gattii NOT DETECTED NOT DETECTED   CTX-M ESBL DETECTED (A) NOT DETECTED   Carbapenem resistance IMP NOT DETECTED NOT DETECTED   Carbapenem resistance KPC NOT DETECTED NOT DETECTED   Carbapenem resistance NDM NOT DETECTED NOT DETECTED   Carbapenem resist OXA 48 LIKE NOT DETECTED NOT DETECTED   Carbapenem resistance VIM NOT DETECTED NOT DETECTED    Cera Rorke A Regie Bunner 08/01/2023  1:08 AM

## 2023-08-02 DIAGNOSIS — A4151 Sepsis due to Escherichia coli [E. coli]: Secondary | ICD-10-CM | POA: Diagnosis not present

## 2023-08-02 DIAGNOSIS — R6521 Severe sepsis with septic shock: Secondary | ICD-10-CM | POA: Diagnosis not present

## 2023-08-02 DIAGNOSIS — G9341 Metabolic encephalopathy: Secondary | ICD-10-CM | POA: Diagnosis not present

## 2023-08-02 LAB — BASIC METABOLIC PANEL
Anion gap: 8 (ref 5–15)
BUN: 88 mg/dL — ABNORMAL HIGH (ref 8–23)
CO2: 18 mmol/L — ABNORMAL LOW (ref 22–32)
Calcium: 8.5 mg/dL — ABNORMAL LOW (ref 8.9–10.3)
Chloride: 110 mmol/L (ref 98–111)
Creatinine, Ser: 2.65 mg/dL — ABNORMAL HIGH (ref 0.61–1.24)
GFR, Estimated: 26 mL/min — ABNORMAL LOW (ref >60)
Glucose, Bld: 160 mg/dL — ABNORMAL HIGH (ref 70–99)
Potassium: 5 mmol/L (ref 3.5–5.1)
Sodium: 136 mmol/L (ref 135–145)

## 2023-08-02 LAB — PHOSPHORUS: Phosphorus: 3.8 mg/dL (ref 2.5–4.6)

## 2023-08-02 LAB — T3, FREE: T3, Free: 1.4 pg/mL — ABNORMAL LOW (ref 2.0–4.4)

## 2023-08-02 LAB — GLUCOSE, CAPILLARY
Glucose-Capillary: 139 mg/dL — ABNORMAL HIGH (ref 70–99)
Glucose-Capillary: 156 mg/dL — ABNORMAL HIGH (ref 70–99)
Glucose-Capillary: 167 mg/dL — ABNORMAL HIGH (ref 70–99)
Glucose-Capillary: 177 mg/dL — ABNORMAL HIGH (ref 70–99)
Glucose-Capillary: 191 mg/dL — ABNORMAL HIGH (ref 70–99)
Glucose-Capillary: 238 mg/dL — ABNORMAL HIGH (ref 70–99)

## 2023-08-02 LAB — URINE CULTURE: Culture: >=100000 — AB

## 2023-08-02 LAB — MAGNESIUM: Magnesium: 2.6 mg/dL — ABNORMAL HIGH (ref 1.7–2.4)

## 2023-08-02 MED ORDER — MIDODRINE HCL 5 MG PO TABS
10.0000 mg | ORAL_TABLET | Freq: Three times a day (TID) | ORAL | Status: DC
  Administered 2023-08-02 – 2023-08-04 (×7): 10 mg via ORAL
  Filled 2023-08-02 (×7): qty 2

## 2023-08-02 MED ORDER — HYDROCORTISONE SOD SUC (PF) 100 MG IJ SOLR
50.0000 mg | Freq: Four times a day (QID) | INTRAMUSCULAR | Status: DC
  Administered 2023-08-02 – 2023-08-04 (×8): 50 mg via INTRAVENOUS
  Filled 2023-08-02 (×8): qty 2

## 2023-08-02 MED ORDER — HYDROCORTISONE 10 MG PO TABS
50.0000 mg | ORAL_TABLET | Freq: Four times a day (QID) | ORAL | Status: DC
  Filled 2023-08-02 (×2): qty 5

## 2023-08-02 NOTE — Progress Notes (Signed)
NAME:  Jeremy [name], MRN:  [id number], DOB:  Mar 02, 1956, LOS: 2 ADMISSION DATE:  07/31/2023, CONSULTATION DATE: 07/31/2023 REFERRING MD: Dr. Jodie Echevaria, CHIEF COMPLAINT: Altered Mental Status    History of Present Illness:  This is a 68 yo male who presented to [facility] ER on 02/16 from [facility] with confusion and poor po intake for several days.  Per ED notes it was reported by the [facility] from the jail the pt has had foul smelling urine, and bacteria was present in the urine dip stick test.    ED Course  Upon arrival to the ER pt hypotensive, hypothermic, and tachycardic.  Significant lab results were: Na+ 131/K+ 5.3/CO2 17/glucose 165/BUN 112/creatinine 3.69/anion gap 16/albumin 2.9/wbc 15.8/hgb 12.7/platelet count 453/PT 26.7/INR 2.4.  Pt met sepsis criteria and received: 3L LR bolus/cefepime/metronidazole/vancomycin.  However, he remained hypotensive requiring levophed gtt.  Resp panel by RT-PCR and CXR negative. PCCM team contacted for ICU admission.    Pertinent  Medical History  CAD s/p CABG x1  Type II Diabetes Mellitus HTN  Paroxysmal Atrial Fibrillation (on xarelto)  Chronic Systolic CHF  Obesity  CKD stage 3a  DVT PE (dx: 06/26/23) Ischemic Cardiomyopathy (Echo 06/27/23: EF 35 to 40%; trivial mitral valve regurgitation; mild to moderate aortic valve regurgitation)  Micro Data:  02/16: Resp panel by RT-PCR>>negative 02/16: Blood x2>>enterobacterales, e.coli, CTX-M ESBL resistance detected  02/16: MRSA PCR>>negative 02/16: RVP>>  Anti-infectives (From admission, onward)    Start     Dose/Rate Route Frequency Ordered Stop   08/01/23 0200  meropenem (MERREM) 1 g in sodium chloride 0.9 % 100 mL IVPB        1 g 200 mL/hr over 30 Minutes Intravenous Every 12 hours 08/01/23 0107     07/31/23 2200  piperacillin-tazobactam (ZOSYN) IVPB 3.375 g  Status:  Discontinued        3.375 g 12.5 mL/hr over 240 Minutes Intravenous Every 8 hours 07/31/23 1633 08/01/23 0107   07/31/23  1645  vancomycin (VANCOCIN) IVPB 1000 mg/200 mL premix        1,000 mg 200 mL/hr over 60 Minutes Intravenous  Once 07/31/23 1633 07/31/23 1912   07/31/23 1628  vancomycin variable dose per unstable renal function (pharmacist dosing)  Status:  Discontinued         Does not apply See admin instructions 07/31/23 1633 08/01/23 0107   07/31/23 1215  ceFEPIme (MAXIPIME) 2 g in sodium chloride 0.9 % 100 mL IVPB        2 g 200 mL/hr over 30 Minutes Intravenous  Once 07/31/23 1213 07/31/23 1440   07/31/23 1215  metroNIDAZOLE (FLAGYL) IVPB 500 mg        500 mg 100 mL/hr over 60 Minutes Intravenous  Once 07/31/23 1213 07/31/23 1540   07/31/23 1215  vancomycin (VANCOCIN) IVPB 1000 mg/200 mL premix        1,000 mg 200 mL/hr over 60 Minutes Intravenous  Once 07/31/23 1213 07/31/23 1646      Significant Hospital Events: Including procedures, antibiotic start and stop dates in addition to other pertinent events   02/16: Pt admitted with acute metabolic encephalopathy and septic/hypovolemic shock suspected secondary to UTI and poor po intake requiring levophed gtt  02/17: Remains on Precedex gtt for agitation/confusion. Bcx growing ESBL Ecoli 02/18: Started on Seroquel for agitation, remains on low dose Precedex  Interim History / Subjective:  Currently requiring levophed gtt @5  mcg/min due to hypotension and precedex for agitation/confusion  Objective   Blood pressure 91/65,  pulse (!) 38, temperature 98.5 F (36.9 C), temperature source Axillary, resp. rate (!) 30, height 5\' 11"  (1.803 m), weight 98.2 kg, SpO2 100%.        Intake/Output Summary (Last 24 hours) at 08/02/2023 0501 Last data filed at 08/02/2023 0100 Gross per 24 hour  Intake 861.46 ml  Output 3760 ml  Net -2898.54 ml   Filed Weights   07/31/23 1120 07/31/23 1700 08/01/23 0500  Weight: 99.8 kg 97.1 kg 98.2 kg   Examination: General: Acutely-ill appearing male, quite tearful, NAD on RA  HENT: Very poor dentition, no JVD   Lungs: Clear throughout, even, non labored  Cardiovascular: Sinus tachycardia with depressed T wave, s1s2, no r/g, 2+ radial/2+ distal pulses, no edema  Abdomen: +BS x4, obese, soft, non tender, non distended  Extremities: Normal bulk and tone, moves all extremities  Skin: Intact no rashes, lesions, or pressure injuries present  Neuro: Alert, oriented to self/time disoriented to situation, following commands, PERRLA  GU: Deferred   Resolved Hospital Problem list    Assessment & Plan:   #Acute metabolic encephalopathy  - CT Head negative - Correct metabolic derangements - Maintain sleep/wake cycle - Avoid sedating medication as able - started on seroquel  and weaning of Precedex for agitation  #Hypotension: hypovolemic and septic shock  #Chronic systolic CHF  Hx: Ischemic cardiomyopathy, PE, DVT, paroxysmal atrial fibrillation, CAD s/p CABG x1, and HTN Echo 06/27/23:  EF 35 to 40%; trivial mitral valve regurgitation; mild to moderate aortic valve regurgitation - Continuous telemetry monitoring  - Hold outpatient beta-blocker and diuretic therapy for now  - resume home Xarelto - Prn levophed gtt to maintain map 65 or higher  - Cautious iv fluid resuscitation  - Thyroid panel and cortisol level unremarkable  #Acute kidney injury superimposed on CKD stage 3A secondary to ATN  #Hyperkalemia  #Hyponatremia  #Anion gap metabolic acidosis  - Trend BMP and vbg  - Replace electrolytes as indicated  - Strict I's&O's - Avoid nephrotoxic agents as able  - pharmacy consulted for electrolytes replacement  #Sepsis secondary to ESBL Ecoli Bacteremia #Septic Shock  - Trend WBC and monitor fever curve  - Trend PCT  - Follow cultures  - CT Abd Pelvis concerning for cystitis otherwise unremarkable - Continue Meropenem  #Type II diabetes mellitus  - Hemoglobin A1c pending  - CBG's q4hrs  - SSI - Follow hyper/hypoglycemic protocol  - Target CBG 140 to 180   Best Practice (right  click and "Reselect all SmartList Selections" daily)   Diet/type: NPO  DVT prophylaxis SCD's Pressure ulcer(s): N/A GI prophylaxis: N/A Lines: N/A Foley:  N/A Code Status:  full code Last date of multidisciplinary goals of care discussion [N/A]  02/18: Pt updated regarding plan of care and all questions answered  Labs   CBC: Recent Labs  Lab 07/31/23 1124 08/01/23 0425  WBC 15.8* 17.4*  NEUTROABS 13.4*  --   HGB 12.7* 11.9*  HCT 37.5* 34.2*  MCV 89.7 89.8  PLT 453* 533*    Basic Metabolic Panel: Recent Labs  Lab 07/31/23 1124 08/01/23 0425  NA 131* 134*  133*  K 5.3* 5.0  4.9  CL 98 104  104  CO2 17* 16*  17*  GLUCOSE 165* 226*  231*  BUN 112* 93*  93*  CREATININE 3.69* 3.09*  2.98*  CALCIUM 9.0 8.6*  8.6*  MG  --  2.5*  PHOS  --  3.9   GFR: Estimated Creatinine Clearance: 29.1 mL/min (A) (by C-G  formula based on SCr of 2.98 mg/dL (H)). Recent Labs  Lab 07/31/23 1124 07/31/23 1407 08/01/23 0425  PROCALCITON  --   --  2.77  WBC 15.8*  --  17.4*  LATICACIDVEN 1.6 1.9  --     Liver Function Tests: Recent Labs  Lab 07/31/23 1124  AST 19  ALT 20  ALKPHOS 72  BILITOT 1.5*  PROT 7.8  ALBUMIN 2.9*   No results for input(s): "LIPASE", "AMYLASE" in the last 168 hours. No results for input(s): "AMMONIA" in the last 168 hours.  ABG    Component Value Date/Time   PHART 7.30 (L) 12/25/2020 1745   PCO2ART 40 12/25/2020 1745   PO2ART 82 (L) 12/25/2020 1745   HCO3 21.1 07/31/2023 1816   ACIDBASEDEF 5.9 (H) 07/31/2023 1816   O2SAT 29.3 07/31/2023 1816     Coagulation Profile: Recent Labs  Lab 07/31/23 1124  INR 2.4*    Cardiac Enzymes: No results for input(s): "CKTOTAL", "CKMB", "CKMBINDEX", "TROPONINI" in the last 168 hours.  HbA1C: Hemoglobin A1C  Date/Time Value Ref Range Status  10/03/2011 05:33 AM 6.2 4.2 - 6.3 % Final    Comment:    The American Diabetes Association recommends that a primary goal of therapy should be <7% and  that physicians should reevaluate the treatment regimen in patients with HbA1c values consistently >8%.    Hgb A1c MFr Bld  Date/Time Value Ref Range Status  06/26/2023 11:51 AM 6.4 (H) 4.8 - 5.6 % Final    Comment:    (NOTE) Pre diabetes:          5.7%-6.4%  Diabetes:              >6.4%  Glycemic control for   <7.0% adults with diabetes   11/25/2022 05:23 AM 6.0 (H) 4.8 - 5.6 % Final    Comment:    (NOTE) Pre diabetes:          5.7%-6.4%  Diabetes:              >6.4%  Glycemic control for   <7.0% adults with diabetes     CBG: Recent Labs  Lab 08/01/23 1248 08/01/23 1654 08/01/23 1939 08/01/23 2348 08/02/23 0451  GLUCAP 218* 137* 88 83 139*    Review of Systems: Positives in BOLD   Gen: poor po intake fever, chills, weight change, fatigue, night sweats HEENT: Denies blurred vision, double vision, hearing loss, tinnitus, sinus congestion, rhinorrhea, sore throat, neck stiffness, dysphagia PULM: Denies shortness of breath, cough, sputum production, hemoptysis, wheezing CV: Denies chest pain, edema, orthopnea, paroxysmal nocturnal dyspnea, palpitations GI: Denies abdominal pain, nausea, vomiting, diarrhea, hematochezia, melena, constipation, change in bowel habits GU: Denies dysuria, hematuria, polyuria, oliguria, urethral discharge Endocrine: Denies hot or cold intolerance, polyuria, polyphagia or appetite change Derm: Denies rash, dry skin, scaling or peeling skin change Heme: Denies easy bruising, bleeding, bleeding gums Neuro: confusion, headache, numbness, weakness, slurred speech, loss of memory or consciousness  Past Medical History:  He,  has a past medical history of Coronary artery disease, Diabetes mellitus without complication (HCC), Hypertension, and S/P CABG x 1.   Surgical History:   Past Surgical History:  Procedure Laterality Date   ANKLE ARTHROSCOPY W/ OPEN REPAIR Right    CORONARY ARTERY BYPASS GRAFT     PULMONARY THROMBECTOMY Bilateral  12/24/2020   Procedure: PULMONARY THROMBECTOMY;  Surgeon: Annice Needy, MD;  Location: [facility] INVASIVE CV LAB;  Service: Cardiovascular;  Laterality: Bilateral;     Social History:  reports that he has been smoking cigarettes. He has never used smokeless tobacco. He reports current alcohol use of about 3.0 standard drinks of alcohol per week. He reports that he does not currently use drugs after having used the following drugs: Marijuana.   Family History:  His family history includes Heart attack in his father.   Allergies No Active Allergies    Home Medications  Prior to Admission medications   Medication Sig Start Date End Date Taking? Authorizing Provider  acetaminophen (TYLENOL) 500 MG tablet Take 2 tablets (1,000 mg total) by mouth every 8 (eight) hours as needed for moderate pain. 12/11/22 12/11/23  [name], MD  amiodarone (PACERONE) 200 MG tablet Take 1 tablet (200 mg total) by mouth daily. 06/28/23   Sunnie Nielsen, DO  aspirin 81 MG chewable tablet Chew 1 tablet (81 mg total) by mouth daily. 06/28/23   Sunnie Nielsen, DO  atorvastatin (LIPITOR) 40 MG tablet Take 1 tablet (40 mg total) by mouth daily. 06/28/23   Sunnie Nielsen, DO  carvedilol (COREG) 6.25 MG tablet Take 1 tablet (6.25 mg total) by mouth 2 (two) times daily with meals. 06/28/23   Sunnie Nielsen, DO  doxycycline (VIBRA-TABS) 100 MG tablet Take 1 tablet (100 mg total) by mouth 2 (two) times daily. 06/28/23   Sunnie Nielsen, DO  metFORMIN (GLUCOPHAGE) 1000 MG tablet Take 1 tablet (1,000 mg total) by mouth once daily with breakfast. 06/28/23   Sunnie Nielsen, DO  nitroGLYCERIN (NITROSTAT) 0.4 MG SL tablet Place 1 tablet (0.4 mg total) under the tongue every 5 (five) minutes as needed for chest pain. 11/26/22   Sunnie Nielsen, DO  pantoprazole (PROTONIX) 20 MG tablet Take 1 tablet (20 mg total) by mouth daily. 06/28/23   Sunnie Nielsen, DO  rivaroxaban (XARELTO) 20 MG TABS tablet Take 1  tablet (20 mg total) by mouth daily at 5 pm 07/20/23 08/19/23  Sunnie Nielsen, DO  RIVAROXABAN Carlena Hurl) VTE STARTER PACK (15 & 20 MG) Take 15 mg by mouth 2 (two) times daily with a meal for 21 days, then take 20mg  by mouth once daily on days 22-30. 06/28/23   Sunnie Nielsen, DO  sacubitril-valsartan (ENTRESTO) 24-26 MG Take 1 tablet by mouth every 12 (twelve) hours. 06/28/23   Sunnie Nielsen, DO  torsemide (DEMADEX) 20 MG tablet Take 1 tablet (20 mg total) by mouth daily. 06/28/23   Sunnie Nielsen, DO  traMADol (ULTRAM) 50 MG tablet Take 1 tablet (50 mg total) by mouth daily as needed for pain. 06/03/23     Scheduled Meds:  Chlorhexidine Gluconate Cloth  6 each Topical Daily   insulin aspart  0-9 Units Subcutaneous Q4H   QUEtiapine  50 mg Oral BID   Rivaroxaban  15 mg Oral Q supper   Continuous Infusions:  dexmedetomidine (PRECEDEX) IV infusion 0.4 mcg/kg/hr (08/02/23 0100)   meropenem (MERREM) IV 1 g (08/01/23 2144)   norepinephrine (LEVOPHED) Adult infusion 6 mcg/min (08/02/23 0100)   PRN Meds:.acetaminophen, diazepam, docusate sodium, ondansetron (ZOFRAN) IV, polyethylene glycol   Critical care time: 45 minutes      Webb Silversmith, DNP, CCRN, FNP-C, AGACNP-BC Acute Care & Family Nurse Practitioner  [facility]  See Amion for personal pager PCCM on call pager [phone] until 7 am
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
## 2023-08-03 ENCOUNTER — Other Ambulatory Visit: Payer: Self-pay

## 2023-08-03 DIAGNOSIS — A4151 Sepsis due to Escherichia coli [E. coli]: Secondary | ICD-10-CM | POA: Diagnosis not present

## 2023-08-03 DIAGNOSIS — I5021 Acute systolic (congestive) heart failure: Secondary | ICD-10-CM | POA: Diagnosis not present

## 2023-08-03 DIAGNOSIS — R6521 Severe sepsis with septic shock: Secondary | ICD-10-CM | POA: Diagnosis not present

## 2023-08-03 DIAGNOSIS — G9341 Metabolic encephalopathy: Secondary | ICD-10-CM | POA: Diagnosis not present

## 2023-08-03 LAB — RENAL FUNCTION PANEL
Albumin: 2.2 g/dL — ABNORMAL LOW (ref 3.5–5.0)
Anion gap: 9 (ref 5–15)
BUN: 74 mg/dL — ABNORMAL HIGH (ref 8–23)
CO2: 17 mmol/L — ABNORMAL LOW (ref 22–32)
Calcium: 8.7 mg/dL — ABNORMAL LOW (ref 8.9–10.3)
Chloride: 108 mmol/L (ref 98–111)
Creatinine, Ser: 2.22 mg/dL — ABNORMAL HIGH (ref 0.61–1.24)
GFR, Estimated: 32 mL/min — ABNORMAL LOW (ref >60)
Glucose, Bld: 196 mg/dL — ABNORMAL HIGH (ref 70–99)
Phosphorus: 4.1 mg/dL (ref 2.5–4.6)
Potassium: 5.2 mmol/L — ABNORMAL HIGH (ref 3.5–5.1)
Sodium: 134 mmol/L — ABNORMAL LOW (ref 135–145)

## 2023-08-03 LAB — MAGNESIUM
Magnesium: 2.7 mg/dL — ABNORMAL HIGH (ref 1.7–2.4)
Magnesium: 2.7 mg/dL — ABNORMAL HIGH (ref 1.7–2.4)

## 2023-08-03 LAB — TROPONIN I (HIGH SENSITIVITY)
Troponin I (High Sensitivity): 8 ng/L (ref <18)
Troponin I (High Sensitivity): 7 ng/L (ref <18)

## 2023-08-03 LAB — CBC
HCT: 34.8 % — ABNORMAL LOW (ref 39.0–52.0)
Hemoglobin: 11.6 g/dL — ABNORMAL LOW (ref 13.0–17.0)
MCH: 30.5 pg (ref 26.0–34.0)
MCHC: 33.3 g/dL (ref 30.0–36.0)
MCV: 91.6 fL (ref 80.0–100.0)
Platelets: 675 10*3/uL — ABNORMAL HIGH (ref 150–400)
RBC: 3.8 MIL/uL — ABNORMAL LOW (ref 4.22–5.81)
RDW: 14.6 % (ref 11.5–15.5)
WBC: 13.6 10*3/uL — ABNORMAL HIGH (ref 4.0–10.5)
nRBC: 0 % (ref 0.0–0.2)

## 2023-08-03 LAB — GLUCOSE, CAPILLARY
Glucose-Capillary: 194 mg/dL — ABNORMAL HIGH (ref 70–99)
Glucose-Capillary: 207 mg/dL — ABNORMAL HIGH (ref 70–99)
Glucose-Capillary: 223 mg/dL — ABNORMAL HIGH (ref 70–99)
Glucose-Capillary: 240 mg/dL — ABNORMAL HIGH (ref 70–99)
Glucose-Capillary: 249 mg/dL — ABNORMAL HIGH (ref 70–99)

## 2023-08-03 LAB — BLOOD GAS, VENOUS
Bicarbonate: 21.1 mmol/L — ABNORMAL HIGH (ref 20.0–28.0)
O2 Saturation: 29.3 mmol/L — ABNORMAL HIGH (ref 0.0–2.0)
Patient temperature: 37
Patient temperature: 37 %
pCO2, Ven: 46 mm[Hg] (ref 44–60)
pH, Ven: 7.27 (ref 7.25–7.43)
pO2, Ven: 21.1 mmol/L (ref 32–45)

## 2023-08-03 LAB — CULTURE, BLOOD (ROUTINE X 2)

## 2023-08-03 LAB — POTASSIUM: Potassium: 4.9 mmol/L (ref 3.5–5.1)

## 2023-08-03 MED ORDER — ALPRAZOLAM 0.5 MG PO TABS
0.5000 mg | ORAL_TABLET | Freq: Every evening | ORAL | Status: DC | PRN
  Administered 2023-08-03: 0.5 mg via ORAL
  Filled 2023-08-03: qty 1

## 2023-08-03 MED ORDER — INSULIN ASPART 100 UNIT/ML IJ SOLN
0.0000 [IU] | INTRAMUSCULAR | Status: DC
  Administered 2023-08-03 – 2023-08-04 (×3): 5 [IU] via SUBCUTANEOUS
  Filled 2023-08-03 (×3): qty 1

## 2023-08-03 MED ORDER — SODIUM ZIRCONIUM CYCLOSILICATE 5 G PO PACK
5.0000 g | PACK | Freq: Once | ORAL | Status: AC
  Administered 2023-08-03: 5 g via ORAL
  Filled 2023-08-03: qty 1

## 2023-08-03 NOTE — Plan of Care (Signed)
  Problem: Education: Goal: Ability to describe self-care measures that may prevent or decrease complications (Diabetes Survival Skills Education) will improve Outcome: Progressing   Problem: Coping: Goal: Ability to adjust to condition or change in health will improve Outcome: Progressing   Problem: Metabolic: Goal: Ability to maintain appropriate glucose levels will improve Outcome: Progressing   Problem: Tissue Perfusion: Goal: Adequacy of tissue perfusion will improve Outcome: Progressing

## 2023-08-03 NOTE — Plan of Care (Signed)
Patient has changed his mind and wants to stay and get help. Patient asked me to tear up AMA form that he signed. He states he is willing to stay and get some help.

## 2023-08-03 NOTE — Progress Notes (Signed)
  DISCUSSION  PATIENT IS ALERT AND AWAKE, ORIENTED TO PERSON, PLACE, TIME AND THE MEDICAL EVENTS. PATIENT IS NOT HOMICIDAL OR SUICIDAL. PATIENT UNDERSTANDS HIS MEDICAL CONDITIONS-THEY WERE EXPLAINED IN DETAIL AND PATIENT WAS ABLE TO RECITE WORD FOR WORD THE UNDERSTANDING OF MEDICAL CONDITIONS.  HE IS AWAKE AND ALERT AND ORIENTED TO PERSON, PLACE AND TIME. ICU NURSE AT BEDSIDE TO WITNESS DISCUSSION  I HAVE EXPLAINED THAT THERE IS WEATHER CONDITIONS THAT ARE NOT FAVORABLE.  PATIENT HAS DECIDED TO LEAVE AGAINST MEDICAL ADVICE AND ICU CARE. WE HAVE PROVIDED ALL THE OPTIONS AND OPTIMIZED MEDICAL CARE TO THE FULL EXTENT.   PATIENT UNDERSTANDS THAT THERE IS VERY HIGH RISK AND CHANCES OF DYING-HIGH RISK FOR STROKE, HEART ATTACK, RESPIRATORY FAILURE AND CARDIAC ARREST AND DEATH.    OUTCOME  PATIENT HAS DECIDED TO LEAVE AGAINST MEDICAL ADVICE   Lucie Leather, M.D.  [facility]  Medical Director [facility] Medical Director [facility] Cardio-P[facility] Department
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
## 2023-08-03 NOTE — Progress Notes (Signed)
Pt requesting to leave AMA, pt cursing at this nurse, stating he wants to speak to doctor and wants to go home. Dr. Belia Heman notified.

## 2023-08-03 NOTE — Progress Notes (Signed)
NAME:  Jeremy [name], MRN:  [id number], DOB:  26-May-1956, LOS: 3 ADMISSION DATE:  07/31/2023, CONSULTATION DATE: 07/31/2023 REFERRING MD: Dr. Jodie Echevaria, CHIEF COMPLAINT: Altered Mental Status    History of Present Illness:  This is a 68 yo male who presented to [facility] ER on 02/16 from [facility] with confusion and poor po intake for several days.  Per ED notes it was [facility] jail the pt has had foul smelling urine, and bacteria was present in the urine dip stick test.    ED Course  Upon arrival to the ER pt hypotensive, hypothermic, and tachycardic.  Significant lab results were: Na+ 131/K+ 5.3/CO2 17/glucose 165/BUN 112/creatinine 3.69/anion gap 16/albumin 2.9/wbc 15.8/hgb 12.7/platelet count 453/PT 26.7/INR 2.4.  Pt met sepsis criteria and received: 3L LR bolus/cefepime/metronidazole/vancomycin.  However, he remained hypotensive requiring levophed gtt.  Resp panel by RT-PCR and CXR negative. PCCM team contacted for ICU admission.    Pertinent  Medical History  CAD s/p CABG x1  Type II Diabetes Mellitus HTN  Paroxysmal Atrial Fibrillation (on xarelto)  Chronic Systolic CHF  Obesity  CKD stage 3a  DVT PE (dx: 06/26/23) Ischemic Cardiomyopathy (Echo 06/27/23: EF 35 to 40%; trivial mitral valve regurgitation; mild to moderate aortic valve regurgitation)  Micro Data:  02/16: Resp panel by RT-PCR>>negative 02/16: Blood x2>>enterobacterales, e.coli, CTX-M ESBL resistance detected  02/16: MRSA PCR>>negative 02/16: RVP>>  Anti-infectives (From admission, onward)    Start     Dose/Rate Route Frequency Ordered Stop   08/01/23 0200  meropenem (MERREM) 1 g in sodium chloride 0.9 % 100 mL IVPB        1 g 200 mL/hr over 30 Minutes Intravenous Every 12 hours 08/01/23 0107     07/31/23 2200  piperacillin-tazobactam (ZOSYN) IVPB 3.375 g  Status:  Discontinued        3.375 g 12.5 mL/hr over 240 Minutes Intravenous Every 8 hours 07/31/23 1633 08/01/23 0107   07/31/23  1645  vancomycin (VANCOCIN) IVPB 1000 mg/200 mL premix        1,000 mg 200 mL/hr over 60 Minutes Intravenous  Once 07/31/23 1633 07/31/23 1912   07/31/23 1628  vancomycin variable dose per unstable renal function (pharmacist dosing)  Status:  Discontinued         Does not apply See admin instructions 07/31/23 1633 08/01/23 0107   07/31/23 1215  ceFEPIme (MAXIPIME) 2 g in sodium chloride 0.9 % 100 mL IVPB        2 g 200 mL/hr over 30 Minutes Intravenous  Once 07/31/23 1213 07/31/23 1440   07/31/23 1215  metroNIDAZOLE (FLAGYL) IVPB 500 mg        500 mg 100 mL/hr over 60 Minutes Intravenous  Once 07/31/23 1213 07/31/23 1540   07/31/23 1215  vancomycin (VANCOCIN) IVPB 1000 mg/200 mL premix        1,000 mg 200 mL/hr over 60 Minutes Intravenous  Once 07/31/23 1213 07/31/23 1646      Significant Hospital Events: Including procedures, antibiotic start and stop dates in addition to other pertinent events   02/16: Pt admitted with acute metabolic encephalopathy and septic/hypovolemic shock suspected secondary to UTI and poor po intake requiring levophed gtt  02/17: Remains on Precedex gtt for agitation/confusion. Bcx growing ESBL Ecoli 02/18: Started on Seroquel for agitation, remains on low dose Precedex  Interim History / Subjective:  Remains critically ill Remains on pressors and precedex +ESBL bacteramia E coli   Objective   Blood pressure 119/86,  pulse (!) 105, temperature 97.7 F (36.5 C), resp. rate 20, height 5\' 11"  (1.803 m), weight 94.3 kg, SpO2 94%.        Intake/Output Summary (Last 24 hours) at 08/03/2023 0730 Last data filed at 08/03/2023 0449 Gross per 24 hour  Intake 1429.6 ml  Output 3300 ml  Net -1870.4 ml   Filed Weights   08/01/23 0500 08/02/23 0500 08/03/23 0455  Weight: 98.2 kg 96.7 kg 94.3 kg  ROS LIMITED DUE TO AGITATION  Examination: General: Acutely-ill appearing male HENT: Very poor dentition, no JVD  Lungs: Clear throughout, even, non labored   Cardiovascular: Sinus tachycardia with depressed T wave, s1s2, no r/g, 2+ radial/2+ distal pulses, no edema  Abdomen: +BS x4, obese, soft, non tender, non distended  Extremities: Normal bulk and tone, moves all extremities  Skin: Intact no rashes, lesions, or pressure injuries present  Neuro: Alert, oriented to self/time disoriented to situation, following commands, PERRLA  GU: Deferred    Assessment & Plan:  68 yo male with acute septic shock due to ESBL e coli bacteremia UTI with Acute metabolic encephalopathy with underlying Chronic systolic CHF  Hx: Ischemic cardiomyopathy, PE, DVT, paroxysmal atrial fibrillation, CAD s/p CABG x1, and HTN  SEPTIC shock SOURCE- ESBL Ecoli Bacteremia -use vasopressors to keep MAP>60 as needed -follow ABG and LA as needed -follow up cultures -emperic ABX -consider stress dose steroids -aggressive IV fluid Resuscitation     NEUROLOGY ACUTE METABOLIC ENCEPHALOPATHY - CT Head negative - Correct metabolic derangements - Maintain sleep/wake cycle - Avoid sedating medication as able - started on seroquel  and weaning of Precedex for agitation  ACUTE SYSTOLIC CARDIAC FAILURE Echo 14/78/29:  EF 35 to 40%; trivial mitral valve regurgitation; mild to moderate aortic valve regurgitation - Continuous telemetry monitoring  - Hold outpatient beta-blocker and diuretic therapy for now  - resume home Xarelto - Prn levophed gtt to maintain map 60 or higher  - Cautious iv fluid resuscitation  - Thyroid panel and cortisol level unremarkable  ACUTE KIDNEY INJURY/Renal Failure CKD stage 3A secondary to ATN   -continue Foley Catheter-assess need -Avoid nephrotoxic agents -Follow urine output, BMP -Ensure adequate renal perfusion, optimize oxygenation -Renal dose medications   Intake/Output Summary (Last 24 hours) at 08/03/2023 0732 Last data filed at 08/03/2023 0449 Gross per 24 hour  Intake 1429.6 ml  Output 3300 ml  Net -1870.4 ml     ENDO -  ICU hypoglycemic\Hyperglycemia protocol -check FSBS per protocol   GI GI PROPHYLAXIS as indicated NUTRITIONAL STATUS DIET-->TF's as tolerated Constipation protocol as indicated   ELECTROLYTES -follow labs as needed -replace as needed -pharmacy consultation and following  RESTRICTIVE TRANSFUSION PROTOCOL TRANSFUSION  IF HGB<7  or ACTIVE BLEEDING OR DX of ACUTE CORONARY SYNDROMES     Best Practice (right click and "Reselect all SmartList Selections" daily)   Diet/type: NPO  DVT prophylaxis SCD's Pressure ulcer(s): N/A GI prophylaxis: N/A Lines: N/A Foley:  N/A Code Status:  full code Last date of multidisciplinary goals of care discussion [N/A]  02/18: Pt updated regarding plan of care and all questions answered  Labs   CBC: Recent Labs  Lab 07/31/23 1124 08/01/23 0425 08/03/23 0444  WBC 15.8* 17.4* 13.6*  NEUTROABS 13.4*  --   --   HGB 12.7* 11.9* 11.6*  HCT 37.5* 34.2* 34.8*  MCV 89.7 89.8 91.6  PLT 453* 533* 675*    Basic Metabolic Panel: Recent Labs  Lab 07/31/23 1124 08/01/23 0425 08/02/23 0628 08/03/23 0444  NA  131* 134*  133* 136 134*  K 5.3* 5.0  4.9 5.0 5.2*  CL 98 104  104 110 108  CO2 17* 16*  17* 18* 17*  GLUCOSE 165* 226*  231* 160* 196*  BUN 112* 93*  93* 88* 74*  CREATININE 3.69* 3.09*  2.98* 2.65* 2.22*  CALCIUM 9.0 8.6*  8.6* 8.5* 8.7*  MG  --  2.5* 2.6* 2.7*  PHOS  --  3.9 3.8 4.1   GFR: Estimated Creatinine Clearance: 38.4 mL/min (A) (by C-G formula based on SCr of 2.22 mg/dL (H)). Recent Labs  Lab 07/31/23 1124 07/31/23 1407 08/01/23 0425 08/03/23 0444  PROCALCITON  --   --  2.77  --   WBC 15.8*  --  17.4* 13.6*  LATICACIDVEN 1.6 1.9  --   --     Liver Function Tests: Recent Labs  Lab 07/31/23 1124 08/03/23 0444  AST 19  --   ALT 20  --   ALKPHOS 72  --   BILITOT 1.5*  --   PROT 7.8  --   ALBUMIN 2.9* 2.2*   No results for input(s): "LIPASE", "AMYLASE" in the last 168 hours. No results for input(s):  "AMMONIA" in the last 168 hours.  ABG    Component Value Date/Time   PHART 7.30 (L) 12/25/2020 1745   PCO2ART 40 12/25/2020 1745   PO2ART 82 (L) 12/25/2020 1745   HCO3 21.1 07/31/2023 1816   ACIDBASEDEF 5.9 (H) 07/31/2023 1816   O2SAT 29.3 07/31/2023 1816     Coagulation Profile: Recent Labs  Lab 07/31/23 1124  INR 2.4*    Cardiac Enzymes: No results for input(s): "CKTOTAL", "CKMB", "CKMBINDEX", "TROPONINI" in the last 168 hours.  HbA1C: Hemoglobin A1C  Date/Time Value Ref Range Status  10/03/2011 05:33 AM 6.2 4.2 - 6.3 % Final    Comment:    The American Diabetes Association recommends that a primary goal of therapy should be <7% and that physicians should reevaluate the treatment regimen in patients with HbA1c values consistently >8%.    Hgb A1c MFr Bld  Date/Time Value Ref Range Status  06/26/2023 11:51 AM 6.4 (H) 4.8 - 5.6 % Final    Comment:    (NOTE) Pre diabetes:          5.7%-6.4%  Diabetes:              >6.4%  Glycemic control for   <7.0% adults with diabetes   11/25/2022 05:23 AM 6.0 (H) 4.8 - 5.6 % Final    Comment:    (NOTE) Pre diabetes:          5.7%-6.4%  Diabetes:              >6.4%  Glycemic control for   <7.0% adults with diabetes     CBG: Recent Labs  Lab 08/02/23 1129 08/02/23 1537 08/02/23 2005 08/02/23 2334 08/03/23 0340  GLUCAP 167* 177* 238* 191* 194*    Past Medical History:  He,  has a past medical history of Coronary artery disease, Diabetes mellitus without complication (HCC), Hypertension, and S/P CABG x 1.   Surgical History:   Past Surgical History:  Procedure Laterality Date   ANKLE ARTHROSCOPY W/ OPEN REPAIR Right    CORONARY ARTERY BYPASS GRAFT     PULMONARY THROMBECTOMY Bilateral 12/24/2020   Procedure: PULMONARY THROMBECTOMY;  Surgeon: Annice Needy, MD;  Location: [facility] INVASIVE CV LAB;  Service: Cardiovascular;  Laterality: Bilateral;     Social History:   reports that he  has been smoking cigarettes.  He has never used smokeless tobacco. He reports current alcohol use of about 3.0 standard drinks of alcohol per week. He reports that he does not currently use drugs after having used the following drugs: Marijuana.   Family History:  His family history includes Heart attack in his father.   Allergies No Active Allergies    Home Medications  Prior to Admission medications   Medication Sig Start Date End Date Taking? Authorizing Provider  acetaminophen (TYLENOL) 500 MG tablet Take 2 tablets (1,000 mg total) by mouth every 8 (eight) hours as needed for moderate pain. 12/11/22 12/11/23  [name], MD  amiodarone (PACERONE) 200 MG tablet Take 1 tablet (200 mg total) by mouth daily. 06/28/23   Sunnie Nielsen, DO  aspirin 81 MG chewable tablet Chew 1 tablet (81 mg total) by mouth daily. 06/28/23   Sunnie Nielsen, DO  atorvastatin (LIPITOR) 40 MG tablet Take 1 tablet (40 mg total) by mouth daily. 06/28/23   Sunnie Nielsen, DO  carvedilol (COREG) 6.25 MG tablet Take 1 tablet (6.25 mg total) by mouth 2 (two) times daily with meals. 06/28/23   Sunnie Nielsen, DO  doxycycline (VIBRA-TABS) 100 MG tablet Take 1 tablet (100 mg total) by mouth 2 (two) times daily. 06/28/23   Sunnie Nielsen, DO  metFORMIN (GLUCOPHAGE) 1000 MG tablet Take 1 tablet (1,000 mg total) by mouth once daily with breakfast. 06/28/23   Sunnie Nielsen, DO  nitroGLYCERIN (NITROSTAT) 0.4 MG SL tablet Place 1 tablet (0.4 mg total) under the tongue every 5 (five) minutes as needed for chest pain. 11/26/22   Sunnie Nielsen, DO  pantoprazole (PROTONIX) 20 MG tablet Take 1 tablet (20 mg total) by mouth daily. 06/28/23   Sunnie Nielsen, DO  rivaroxaban (XARELTO) 20 MG TABS tablet Take 1 tablet (20 mg total) by mouth daily at 5 pm 07/20/23 08/19/23  Sunnie Nielsen, DO  RIVAROXABAN Carlena Hurl) VTE STARTER PACK (15 & 20 MG) Take 15 mg by mouth 2 (two) times daily with a meal for 21 days, then take 20mg  by mouth once  daily on days 22-30. 06/28/23   Sunnie Nielsen, DO  sacubitril-valsartan (ENTRESTO) 24-26 MG Take 1 tablet by mouth every 12 (twelve) hours. 06/28/23   Sunnie Nielsen, DO  torsemide (DEMADEX) 20 MG tablet Take 1 tablet (20 mg total) by mouth daily. 06/28/23   Sunnie Nielsen, DO  traMADol (ULTRAM) 50 MG tablet Take 1 tablet (50 mg total) by mouth daily as needed for pain. 06/03/23     Scheduled Meds:  Chlorhexidine Gluconate Cloth  6 each Topical Daily   hydrocortisone sod succinate (SOLU-CORTEF) inj  50 mg Intravenous Q6H   insulin aspart  0-9 Units Subcutaneous Q4H   midodrine  10 mg Oral TID WC   QUEtiapine  50 mg Oral BID   Rivaroxaban  15 mg Oral Q supper   Continuous Infusions:  meropenem (MERREM) IV Stopped (08/02/23 2200)   norepinephrine (LEVOPHED) Adult infusion 2 mcg/min (08/03/23 0636)   PRN Meds:.acetaminophen, diazepam, docusate sodium, ondansetron (ZOFRAN) IV, polyethylene glycol     DVT/GI PRX  assessed I Assessed the need for Labs I Assessed the need for Foley I Assessed the need for Central Venous Line Family Discussion when available I Assessed the need for Mobilization I made an Assessment of medications to be adjusted accordingly Safety Risk assessment completed  CASE DISCUSSED IN MULTIDISCIPLINARY ROUNDS WITH ICU TEAM     Critical Care Time devoted to patient care services described in this note  is 45 minutes.    Lucie Leather, M.D.  [facility]  Medical Director [facility] Medical Director [facility] Cardio-Pulmonary Department

## 2023-08-03 NOTE — Plan of Care (Signed)
  Problem: Metabolic: Goal: Ability to maintain appropriate glucose levels will improve Outcome: Progressing   Problem: Nutritional: Goal: Maintenance of adequate nutrition will improve Outcome: Progressing   Problem: Skin Integrity: Goal: Risk for impaired skin integrity will decrease Outcome: Progressing   Problem: Coping: Goal: Ability to adjust to condition or change in health will improve Outcome: Not Progressing   Problem: Health Behavior/Discharge Planning: Goal: Ability to manage health-related needs will improve Outcome: Not Progressing   Problem: Education: Goal: Knowledge of General Education information will improve Description: Including pain rating scale, medication(s)/side effects and non-pharmacologic comfort measures Outcome: Not Progressing

## 2023-08-03 NOTE — Progress Notes (Addendum)
PHARMACY CONSULT NOTE - FOLLOW UP  Pharmacy Consult for Electrolyte Monitoring and Replacement   Recent Labs: Potassium (mmol/L)  Date Value  08/03/2023 5.2 (H)  10/03/2011 3.8   Magnesium (mg/dL)  Date Value  16/03/9603 2.7 (H)   Calcium (mg/dL)  Date Value  54/02/8118 8.7 (L)   Calcium, Total (mg/dL)  Date Value  14/78/2956 8.7   Albumin (g/dL)  Date Value  21/30/8657 2.2 (L)  10/02/2011 3.6   Phosphorus (mg/dL)  Date Value  84/69/6295 4.1   Sodium (mmol/L)  Date Value  08/03/2023 134 (L)  10/03/2011 146 (H)   Assessment: Jeremy [name] is a 68 yo male who [facility] foul smelling urine and confusion with a history of poor oral intake. Pharmacy has been consulted to monitor with patient's electrolytes while in the ICU.   Fluids: NA Medications: NA Diet: Healthy Carb  Goal of Therapy:  Electrolytes WNL Today, 08/03/2023 K = 5.2 Mg = 2.7 Phos = 4.1  Continue to monitor Na, patient arrived with low sodium levels (134 >> 136 >> 133)   Plan:  No replacement indicated at this time Continue to monitor Na levels (no longer receiving NS) Continue to monitor K levels  Gave Lokelma 5 mg x 1  Continue to monitor electrolytes with AM labs   Effie Shy, PharmD Pharmacy Resident  08/03/2023 6:37 AM

## 2023-08-03 NOTE — Discharge Summary (Signed)
Physician Discharge Summary  Patient ID: Jeremy [name] MRN: [id number] DOB/AGE: 03/12/1956 68 y.o.  Admit date: 07/31/2023 Discharge date: 08/03/2023  Admission Diagnoses:  Discharge Diagnoses:  Principal Problem:   Septic shock [facility])   Discharged Condition: critical  Hospital Course:     68 yo male who presented to [facility] ER on 02/16 from [facility] with confusion and poor po intake for several days.  Per ED notes it was [facility] jail the pt has had foul smelling urine, and bacteria was present in the urine dip stick test.     ED Course  Upon arrival to the ER pt hypotensive, hypothermic, and tachycardic.  Significant lab results were: Na+ 131/K+ 5.3/CO2 17/glucose 165/BUN 112/creatinine 3.69/anion gap 16/albumin 2.9/wbc 15.8/hgb 12.7/platelet count 453/PT 26.7/INR 2.4.  Pt met sepsis criteria and received: 3L LR bolus/cefepime/metronidazole/vancomycin.  However, he remained hypotensive requiring levophed gtt.  Resp panel by RT-PCR and CXR negative. PCCM team contacted for ICU admission.     Disposition:   68 yo male with acute septic shock due to ESBL e coli bacteremia UTI with Acute metabolic encephalopathy with underlying Chronic systolic CHF  Hx: Ischemic cardiomyopathy, PE, DVT, paroxysmal atrial fibrillation, CAD s/p CABG x1, and HTN   SEPTIC shock SOURCE- ESBL Ecoli Bacteremia -use vasopressors to keep MAP>60 as needed  DISCUSSION   PATIENT IS ALERT AND AWAKE, ORIENTED TO PERSON, PLACE, TIME AND THE MEDICAL EVENTS. PATIENT IS NOT HOMICIDAL OR SUICIDAL. PATIENT UNDERSTANDS HIS MEDICAL CONDITIONS-THEY WERE EXPLAINED IN DETAIL AND PATIENT WAS ABLE TO RECITE WORD FOR WORD THE UNDERSTANDING OF MEDICAL CONDITIONS.   HE IS AWAKE AND ALERT AND ORIENTED TO PERSON, PLACE AND TIME. ICU NURSE AT BEDSIDE TO WITNESS DISCUSSION   I HAVE EXPLAINED THAT THERE IS WEATHER CONDITIONS THAT ARE NOT FAVORABLE.  PATIENT HAS DECIDED TO LEAVE AGAINST MEDICAL  ADVICE AND ICU CARE. WE HAVE PROVIDED ALL THE OPTIONS AND OPTIMIZED MEDICAL CARE TO THE FULL EXTENT.     PATIENT UNDERSTANDS THAT THERE IS VERY HIGH RISK AND CHANCES OF DYING-HIGH RISK FOR STROKE, HEART ATTACK, RESPIRATORY FAILURE AND CARDIAC ARREST AND DEATH.       OUTCOME   PATIENT HAS DECIDED TO LEAVE AGAINST MEDICAL ADVICE    Signed: Erin Fulling 08/03/2023, 3:32 PM

## 2023-08-03 NOTE — Progress Notes (Signed)
After signing AMA form patient is requesting prescription and discharge paper work, I informed patient that because he is leaving AMA he will not receive prescriptions or discharge summary. Pt states " I am not leaving then" Pt agitated sitting on the side of the bed, HR up to 200's BP elevated. Jeri Modena NP aware. Will order EKG and labs. Will continue to monitor.

## 2023-08-04 DIAGNOSIS — A419 Sepsis, unspecified organism: Secondary | ICD-10-CM | POA: Diagnosis not present

## 2023-08-04 DIAGNOSIS — R6521 Severe sepsis with septic shock: Secondary | ICD-10-CM | POA: Diagnosis not present

## 2023-08-04 LAB — RENAL FUNCTION PANEL
Albumin: 2.4 g/dL — ABNORMAL LOW (ref 3.5–5.0)
Anion gap: 10 (ref 5–15)
BUN: 60 mg/dL — ABNORMAL HIGH (ref 8–23)
CO2: 22 mmol/L (ref 22–32)
Calcium: 8.9 mg/dL (ref 8.9–10.3)
Chloride: 106 mmol/L (ref 98–111)
Creatinine, Ser: 2.01 mg/dL — ABNORMAL HIGH (ref 0.61–1.24)
GFR, Estimated: 36 mL/min — ABNORMAL LOW (ref >60)
Glucose, Bld: 270 mg/dL — ABNORMAL HIGH (ref 70–99)
Phosphorus: 3.1 mg/dL (ref 2.5–4.6)
Potassium: 4.4 mmol/L (ref 3.5–5.1)
Sodium: 138 mmol/L (ref 135–145)

## 2023-08-04 LAB — CBC
HCT: 33.4 % — ABNORMAL LOW (ref 39.0–52.0)
Hemoglobin: 11.4 g/dL — ABNORMAL LOW (ref 13.0–17.0)
MCH: 31.2 pg (ref 26.0–34.0)
MCHC: 34.1 g/dL (ref 30.0–36.0)
MCV: 91.5 fL (ref 80.0–100.0)
Platelets: 757 10*3/uL — ABNORMAL HIGH (ref 150–400)
RBC: 3.65 MIL/uL — ABNORMAL LOW (ref 4.22–5.81)
RDW: 14.6 % (ref 11.5–15.5)
WBC: 18 10*3/uL — ABNORMAL HIGH (ref 4.0–10.5)
nRBC: 0 % (ref 0.0–0.2)

## 2023-08-04 LAB — GLUCOSE, CAPILLARY
Glucose-Capillary: 209 mg/dL — ABNORMAL HIGH (ref 70–99)
Glucose-Capillary: 241 mg/dL — ABNORMAL HIGH (ref 70–99)
Glucose-Capillary: 228 mg/dL — ABNORMAL HIGH (ref 70–99)

## 2023-08-04 LAB — MAGNESIUM: Magnesium: 2.6 mg/dL — ABNORMAL HIGH (ref 1.7–2.4)

## 2023-08-04 MED ORDER — HYDROCORTISONE SOD SUC (PF) 100 MG IJ SOLR
50.0000 mg | Freq: Two times a day (BID) | INTRAMUSCULAR | Status: DC
Start: 2023-08-04 — End: 2023-08-04

## 2023-08-04 MED ORDER — INSULIN GLARGINE-YFGN 100 UNIT/ML ~~LOC~~ SOLN
6.0000 [IU] | Freq: Every day | SUBCUTANEOUS | Status: DC
  Filled 2023-08-04: qty 0.06

## 2023-08-04 MED ORDER — INSULIN GLARGINE-YFGN 100 UNIT/ML ~~LOC~~ SOLN
10.0000 [IU] | Freq: Every day | SUBCUTANEOUS | Status: DC
  Administered 2023-08-04: 10 [IU] via SUBCUTANEOUS
  Filled 2023-08-04: qty 0.1

## 2023-08-04 MED ORDER — INSULIN ASPART 100 UNIT/ML IJ SOLN
4.0000 [IU] | Freq: Three times a day (TID) | INTRAMUSCULAR | Status: DC
  Administered 2023-08-04: 4 [IU] via SUBCUTANEOUS
  Filled 2023-08-04: qty 1

## 2023-08-04 MED ORDER — INSULIN ASPART 100 UNIT/ML IJ SOLN
0.0000 [IU] | INTRAMUSCULAR | Status: DC
  Administered 2023-08-04: 7 [IU] via SUBCUTANEOUS
  Filled 2023-08-04: qty 1

## 2023-08-04 MED ORDER — INSULIN ASPART 100 UNIT/ML IJ SOLN
0.0000 [IU] | Freq: Three times a day (TID) | INTRAMUSCULAR | Status: DC
  Administered 2023-08-04: 7 [IU] via SUBCUTANEOUS
  Filled 2023-08-04: qty 1

## 2023-08-04 NOTE — Discharge Summary (Signed)
 Physician Discharge Summary   Patient: Jeremy [name] MRN: [id number] DOB: 03-22-1956  Admit date:     07/31/2023  Discharge date: 08/04/2023  Discharge Physician: Pennie Banter   PCP: Barbette Reichmann, MD    PATIENT SIGNED OUT AGAINST MEDICAL ADVICE THIS AFTERNOON He was advised of the risks to health and life by leaving the hospital prior to completion of recommended therapies as outlined below.  Pt had signed out AMA yesterday while under PCCM's care, but later decided to stay.  Today, despite all attempts made to address his concerns, patient refuses to remain in the hospital further and he accepts the risks and consequences of leaving AMA.  Discharge Diagnoses: Principal Problem:   Septic shock (HCC)  Resolved Problems:   * No resolved hospital problems. [facility] Course: HPI on admission to ICU on 07/31/23: "68 yo male who presented to [facility] ER on 02/16 from [facility] with confusion and poor po intake for several days.  Per ED notes it was [facility] the pt has had foul smelling urine, and bacteria was present in the urine dip stick test.     ED Course  Upon arrival to the ER pt hypotensive, hypothermic, and tachycardic.  Significant lab results were: Na+ 131/K+ 5.3/CO2 17/glucose 165/BUN 112/creatinine 3.69/anion gap 16/albumin 2.9/wbc 15.8/hgb 12.7/platelet count 453/PT 26.7/INR 2.4.  Pt met sepsis criteria and received: 3L LR bolus/cefepime/metronidazole/vancomycin.  However, he remained hypotensive requiring levophed gtt.  Resp panel by RT-PCR and CXR negative. PCCM team contacted for ICU admission.  "  See PCCM progress notes through 2/19 for significant events and management.  In summary, pt was admitted to ICU with septic shock and acute metabolic encephalopathy.  Initially required vasopressors and Precedex drip.  Pt was found to have ESBL UTI with bacteremia and being treated with IV antibiotics pending culture results.  Throughout  admission, once encephalopathy improved, pt threatened to leave AMA.  See PCCM notes.  2/20 -- I assumed care of pt.  Seen on rounds in AM and pt denied complaints, stated feeling better, was agreeable to remain for ongoing care.  This afternoon, notified by RN that patient again wanting to leave AMA.  Pt was alert and fully oriented.  He signed out AMA.   Assessment and Plan: No notes have been filed under this hospital service. Service: Hospitalist        Consultants: admitted by PCCM  Procedures performed:   Disposition: Home  Diet recommendation:  Cardiac diet  DISCHARGE MEDICATION:  Not completed -- pt signed out [facility]   Discharge Exam: Filed Weights   08/02/23 0500 08/03/23 0455 08/04/23 0306  Weight: 96.7 kg 94.3 kg 96.4 kg   General exam: awake, alert, no acute distress HEENT: poor dentition, moist mucus membranes, hearing grossly normal  Respiratory system: CTAB, no wheezes, rales or rhonchi, normal respiratory effort. Cardiovascular system: normal S1/S2, RRR, no JVD, murmurs, rubs, gallops,  no pedal edema.   Gastrointestinal system: soft, NT, ND, no HSM felt, +bowel sounds. Central nervous system: A&O x3. no gross focal neurologic deficits, normal speech Extremities: moves all, no edema, normal tone Skin: dry, intact, normal temperature Psychiatry: normal mood, congruent affect, judgement and insight appear normal   Condition at discharge: stable  The results of significant diagnostics from this hospitalization (including imaging, microbiology, ancillary and laboratory) are listed below for reference.   Imaging Studies: CT Head Wo Contrast Result Date: 07/31/2023 CLINICAL DATA:  Mental status change, unknown cause  EXAM: CT HEAD WITHOUT CONTRAST TECHNIQUE: Contiguous axial images were obtained from the base of the skull through the vertex without intravenous contrast. RADIATION DOSE REDUCTION: This exam was performed according to the departmental  dose-optimization program which includes automated exposure control, adjustment of the mA and/or kV according to patient size and/or use of iterative reconstruction technique. COMPARISON:  04/29/2023 FINDINGS: Brain: No intracranial hemorrhage, mass effect, or midline shift. Stable degree of atrophy. No hydrocephalus. The basilar cisterns are patent. Moderate to advanced chronic small vessel ischemia. Remote right basal gangliar lacunar infarcts. Remote right cerebellar lacunar infarct. No evidence of territorial infarct or acute ischemia. No extra-axial or intracranial fluid collection. Vascular: Atherosclerosis of skullbase vasculature without hyperdense vessel or abnormal calcification. Skull: Normal. Negative for fracture or focal lesion. Sinuses/Orbits: Improved mucosal thickening throughout the ethmoid air cells. Improved left maxillary sinus mucosal thickening. Other: None. IMPRESSION: 1. No acute intracranial abnormality. 2. Atrophy and chronic small vessel ischemia. Remote lacunar infarcts. Electronically Signed   By: Narda Rutherford M.D.   On: 07/31/2023 16:52   CT ABDOMEN PELVIS WO CONTRAST Result Date: 07/31/2023 CLINICAL DATA:  Sepsis, urinary tract infection.  Confusion. EXAM: CT ABDOMEN AND PELVIS WITHOUT CONTRAST TECHNIQUE: Multidetector CT imaging of the abdomen and pelvis was performed following the standard protocol without IV contrast. RADIATION DOSE REDUCTION: This exam was performed according to the departmental dose-optimization program which includes automated exposure control, adjustment of the mA and/or kV according to patient size and/or use of iterative reconstruction technique. COMPARISON:  02/10/2018 FINDINGS: Despite efforts by the technologist and patient, motion artifact is present on today's exam and could not be eliminated. This reduces exam sensitivity and specificity. Lower chest: Coronary and aortic atherosclerosis. Mild cardiomegaly. Hepatobiliary: Unremarkable Pancreas:  Unremarkable Spleen: Unremarkable Adrenals/Urinary Tract: Mild wall thickening of the urinary bladder may be from cystitis. No hydronephrosis or definite hydroureter. Adrenal glands unremarkable. Stomach/Bowel: Descending and sigmoid colon diverticulosis, without compelling findings of active diverticulitis. Normal appendix. No dilated bowel. Vascular/Lymphatic: Atherosclerosis is present, including aortoiliac atherosclerotic disease. Reproductive: Unremarkable Other: No supplemental non-categorized findings. Musculoskeletal: Lower lumbar spondylosis and degenerative disc disease. IMPRESSION: 1. Mild wall thickening of the urinary bladder may be from cystitis. 2. Descending and sigmoid colon diverticulosis, without compelling findings of active diverticulitis. 3. Lower lumbar spondylosis and degenerative disc disease. 4. Coronary and aortic atherosclerosis. Mild cardiomegaly. Aortic Atherosclerosis (ICD10-I70.0). Electronically Signed   By: Gaylyn Rong M.D.   On: 07/31/2023 16:49   DG Chest 2 View Result Date: 07/31/2023 CLINICAL DATA:  Suspected sepsis. EXAM: CHEST - 2 VIEW COMPARISON:  06/26/2023. FINDINGS: Bilateral lung fields are clear. Bilateral costophrenic angles are clear. Stable cardio-mediastinal silhouette. Sternotomy wires noted. No acute osseous abnormalities. The soft tissues are within normal limits. IMPRESSION: No active cardiopulmonary disease. Electronically Signed   By: Jules Schick M.D.   On: 07/31/2023 12:31    Microbiology: Results for orders placed or performed during the hospital encounter of 07/31/23  Culture, blood (Routine x 2)     Status: Abnormal   Collection Time: 07/31/23 11:29 AM   Specimen: BLOOD  Result Value Ref Range Status   Specimen Description   Final    BLOOD RIGHT ANTECUBITAL Performed at [facility], [address] [place], [place] [address]    Special Requests   Final    BOTTLES DRAWN AEROBIC AND ANAEROBIC Blood Culture results may not  be optimal due to an inadequate volume of blood received in culture bottles Performed at [facility], [address] [place], [place] [address]    Culture  Setup Time   Final    GRAM NEGATIVE RODS ANAEROBIC BOTTLE ONLY Organism ID to follow CRITICAL RESULT CALLED TO, READ BACK BY AND VERIFIED WITH: Mila Merry @ 1308 08/01/23 LFD    Culture (A)  Final    ESCHERICHIA COLI Confirmed Extended Spectrum Beta-Lactamase Producer (ESBL).  In bloodstream infections from ESBL organisms, carbapenems are preferred over piperacillin/tazobactam. They are shown to have a lower risk of mortality.    Report Status 08/03/2023 FINAL  Final   Organism ID, Bacteria ESCHERICHIA COLI  Final      Susceptibility   Escherichia coli - MIC*    AMPICILLIN >=32 RESISTANT Resistant     CEFEPIME >=32 RESISTANT Resistant     CEFTAZIDIME RESISTANT Resistant     CEFTRIAXONE >=64 RESISTANT Resistant     CIPROFLOXACIN >=4 RESISTANT Resistant     GENTAMICIN <=1 SENSITIVE Sensitive     IMIPENEM <=0.25 SENSITIVE Sensitive     TRIMETH/SULFA <=20 SENSITIVE Sensitive     AMPICILLIN/SULBACTAM >=32 RESISTANT Resistant     PIP/TAZO 8 SENSITIVE Sensitive ug/mL    * ESCHERICHIA COLI  Blood Culture ID Panel (Reflexed)     Status: Abnormal   Collection Time: 07/31/23 11:29 AM  Result Value Ref Range Status   Enterococcus faecalis NOT DETECTED NOT DETECTED Final   Enterococcus Faecium NOT DETECTED NOT DETECTED Final   Listeria monocytogenes NOT DETECTED NOT DETECTED Final   Staphylococcus species NOT DETECTED NOT DETECTED Final   Staphylococcus aureus (BCID) NOT DETECTED NOT DETECTED Final   Staphylococcus epidermidis NOT DETECTED NOT DETECTED Final   Staphylococcus lugdunensis NOT DETECTED NOT DETECTED Final   Streptococcus species NOT DETECTED NOT DETECTED Final   Streptococcus agalactiae NOT DETECTED NOT DETECTED Final   Streptococcus pneumoniae NOT DETECTED NOT DETECTED Final   Streptococcus pyogenes NOT  DETECTED NOT DETECTED Final   A.calcoaceticus-baumannii NOT DETECTED NOT DETECTED Final   Bacteroides fragilis NOT DETECTED NOT DETECTED Final   Enterobacterales DETECTED (A) NOT DETECTED Final    Comment: Enterobacterales represent a large order of gram negative bacteria, not a single organism. CRITICAL RESULT CALLED TO, READ BACK BY AND VERIFIED WITH: WALID NAZARI @ 6578 08/01/23 LFD    Enterobacter cloacae complex NOT DETECTED NOT DETECTED Final   Escherichia coli DETECTED (A) NOT DETECTED Final    Comment: CRITICAL RESULT CALLED TO, READ BACK BY AND VERIFIED WITH: WALID NAZARI @ 0042 08/01/23 LFD    Klebsiella aerogenes NOT DETECTED NOT DETECTED Final   Klebsiella oxytoca NOT DETECTED NOT DETECTED Final   Klebsiella pneumoniae NOT DETECTED NOT DETECTED Final   Proteus species NOT DETECTED NOT DETECTED Final   Salmonella species NOT DETECTED NOT DETECTED Final   Serratia marcescens NOT DETECTED NOT DETECTED Final   Haemophilus influenzae NOT DETECTED NOT DETECTED Final   Neisseria meningitidis NOT DETECTED NOT DETECTED Final   Pseudomonas aeruginosa NOT DETECTED NOT DETECTED Final   Stenotrophomonas maltophilia NOT DETECTED NOT DETECTED Final   Candida albicans NOT DETECTED NOT DETECTED Final   Candida auris NOT DETECTED NOT DETECTED Final   Candida glabrata NOT DETECTED NOT DETECTED Final   Candida krusei NOT DETECTED NOT DETECTED Final   Candida parapsilosis NOT DETECTED NOT DETECTED Final   Candida tropicalis NOT DETECTED NOT DETECTED Final   Cryptococcus neoformans/gattii NOT DETECTED NOT DETECTED Final   CTX-M ESBL DETECTED (A) NOT DETECTED Final    Comment: CRITICAL RESULT CALLED TO, READ BACK BY AND VERIFIED  WITH: WALID NAZARI @0042  08/01/23 LFD (NOTE) Extended spectrum beta-lactamase detected. Recommend a carbapenem as initial therapy.      Carbapenem resistance IMP NOT DETECTED NOT DETECTED Final   Carbapenem resistance KPC NOT DETECTED NOT DETECTED Final    Carbapenem resistance NDM NOT DETECTED NOT DETECTED Final   Carbapenem resist OXA 48 LIKE NOT DETECTED NOT DETECTED Final   Carbapenem resistance VIM NOT DETECTED NOT DETECTED Final    Comment: Performed at [facility], [address] [place], [place] [address]  Culture, blood (Routine x 2)     Status: None (Preliminary result)   Collection Time: 07/31/23 11:36 AM   Specimen: BLOOD  Result Value Ref Range Status   Specimen Description BLOOD LEFT ANTECUBITAL  Final   Special Requests   Final    BOTTLES DRAWN AEROBIC AND ANAEROBIC Blood Culture results may not be optimal due to an inadequate volume of blood received in culture bottles   Culture   Final    NO GROWTH 4 DAYS Performed at [facility], [address], [place], [place] [address]    Report Status PENDING  Incomplete  Resp panel by RT-PCR (RSV, Flu A&B, Covid) Anterior Nasal Swab     Status: None   Collection Time: 07/31/23 12:50 PM   Specimen: Anterior Nasal Swab  Result Value Ref Range Status   SARS Coronavirus 2 by RT PCR NEGATIVE NEGATIVE Final    Comment: (NOTE) SARS-CoV-2 target nucleic acids are NOT DETECTED.  The SARS-CoV-2 RNA is generally detectable in upper respiratory specimens during the acute phase of infection. The lowest concentration of SARS-CoV-2 viral copies this assay can detect is 138 copies/mL. A negative result does not preclude SARS-Cov-2 infection and should not be used as the sole basis for treatment or other patient management decisions. A negative result may occur with  improper specimen collection/handling, submission of specimen other than nasopharyngeal swab, presence of viral mutation(s) within the areas targeted by this assay, and inadequate number of viral copies(<138 copies/mL). A negative result must be combined with clinical observations, patient history, and epidemiological information. The expected result is Negative.  Fact Sheet for Patients:   BloggerCourse.com  Fact Sheet for Healthcare Providers:  SeriousBroker.it  This test is no t yet approved or cleared by the [place] FDA and  has been authorized for detection and/or diagnosis of SARS-CoV-2 by FDA under an Emergency Use Authorization (EUA). This EUA will remain  in effect (meaning this test can be used) for the duration of the COVID-19 declaration under Section 564(b)(1) of the Act, 21 U.S.C.section 360bbb-3(b)(1), unless the authorization is terminated  or revoked sooner.       Influenza A by PCR NEGATIVE NEGATIVE Final   Influenza B by PCR NEGATIVE NEGATIVE Final    Comment: (NOTE) The Xpert Xpress SARS-CoV-2/FLU/RSV plus assay is intended as an aid in the diagnosis of influenza from Nasopharyngeal swab specimens and should not be used as a sole basis for treatment. Nasal washings and aspirates are unacceptable for Xpert Xpress SARS-CoV-2/FLU/RSV testing.  Fact Sheet for Patients: BloggerCourse.com  Fact Sheet for Healthcare Providers: SeriousBroker.it  This test is not yet approved or cleared by the [place] FDA and has been authorized for detection and/or diagnosis of SARS-CoV-2 by FDA under an Emergency Use Authorization (EUA). This EUA will remain in effect (meaning this test can be used) for the duration of the COVID-19 declaration under Section 564(b)(1) of the Act, 21 U.S.C. section 360bbb-3(b)(1), unless the authorization is terminated  or revoked.     Resp Syncytial Virus by PCR NEGATIVE NEGATIVE Final    Comment: (NOTE) Fact Sheet for Patients: BloggerCourse.com  Fact Sheet for Healthcare Providers: SeriousBroker.it  This test is not yet approved or cleared by the [place] FDA and has been authorized for detection and/or diagnosis of SARS-CoV-2 by FDA under an Emergency Use  Authorization (EUA). This EUA will remain in effect (meaning this test can be used) for the duration of the COVID-19 declaration under Section 564(b)(1) of the Act, 21 U.S.C. section 360bbb-3(b)(1), unless the authorization is terminated or revoked.  Performed at [facility], [address] [place], [place] [address]   Urine Culture     Status: Abnormal   Collection Time: 07/31/23  3:53 PM   Specimen: Urine, Random  Result Value Ref Range Status   Specimen Description   Final    URINE, RANDOM Performed at [facility], [address] [place], [place] [address]    Special Requests   Final    NONE Reflexed from [id number] Performed at [facility], [address] [place], [place] [address]    Culture >=100,000 COLONIES/mL ESCHERICHIA COLI (A)  Final   Report Status 08/02/2023 FINAL  Final   Organism ID, Bacteria ESCHERICHIA COLI (A)  Final      Susceptibility   Escherichia coli - MIC*    AMPICILLIN >=32 RESISTANT Resistant     CEFAZOLIN >=64 RESISTANT Resistant     CEFEPIME 16 RESISTANT Resistant     CEFTRIAXONE >=64 RESISTANT Resistant     CIPROFLOXACIN >=4 RESISTANT Resistant     GENTAMICIN <=1 SENSITIVE Sensitive     IMIPENEM <=0.25 SENSITIVE Sensitive     NITROFURANTOIN 128 RESISTANT Resistant     TRIMETH/SULFA <=20 SENSITIVE Sensitive     AMPICILLIN/SULBACTAM >=32 RESISTANT Resistant     PIP/TAZO 8 SENSITIVE Sensitive ug/mL    * >=100,000 COLONIES/mL ESCHERICHIA COLI  MRSA Next Gen by PCR, Nasal     Status: None   Collection Time: 07/31/23  5:06 PM  Result Value Ref Range Status   MRSA by PCR Next Gen NOT DETECTED NOT DETECTED Final    Comment: (NOTE) The GeneXpert MRSA Assay (FDA approved for NASAL specimens only), is one component of a comprehensive MRSA colonization surveillance program. It is not intended to diagnose MRSA infection nor to guide or monitor treatment for MRSA infections. Test performance is not FDA approved in patients  less than 40 years old. Performed at [facility], [address] [place], [place] [address]   Respiratory (~20 pathogens) panel by PCR     Status: None   Collection Time: 08/01/23  8:11 AM  Result Value Ref Range Status   Adenovirus NOT DETECTED NOT DETECTED Final   Coronavirus 229E NOT DETECTED NOT DETECTED Final    Comment: (NOTE) The Coronavirus on the Respiratory Panel, DOES NOT test for the novel  Coronavirus (2019 nCoV)    Coronavirus HKU1 NOT DETECTED NOT DETECTED Final   Coronavirus NL63 NOT DETECTED NOT DETECTED Final   Coronavirus OC43 NOT DETECTED NOT DETECTED Final   Metapneumovirus NOT DETECTED NOT DETECTED Final   Rhinovirus / Enterovirus NOT DETECTED NOT DETECTED Final   Influenza A NOT DETECTED NOT DETECTED Final   Influenza B NOT DETECTED NOT DETECTED Final   Parainfluenza Virus 1 NOT DETECTED NOT DETECTED Final   Parainfluenza Virus 2 NOT DETECTED NOT DETECTED Final   Parainfluenza Virus 3 NOT DETECTED NOT DETECTED Final   Parainfluenza  Virus 4 NOT DETECTED NOT DETECTED Final   Respiratory Syncytial Virus NOT DETECTED NOT DETECTED Final   Bordetella pertussis NOT DETECTED NOT DETECTED Final   Bordetella Parapertussis NOT DETECTED NOT DETECTED Final   Chlamydophila pneumoniae NOT DETECTED NOT DETECTED Final   Mycoplasma pneumoniae NOT DETECTED NOT DETECTED Final    Comment: Performed at [facility], [address] [place], [place] [address]    Labs: CBC: Recent Labs  Lab 07/31/23 1124 08/01/23 0425 08/03/23 0444 08/04/23 0421  WBC 15.8* 17.4* 13.6* 18.0*  NEUTROABS 13.4*  --   --   --   HGB 12.7* 11.9* 11.6* 11.4*  HCT 37.5* 34.2* 34.8* 33.4*  MCV 89.7 89.8 91.6 91.5  PLT 453* 533* 675* 757*   Basic Metabolic Panel: Recent Labs  Lab 07/31/23 1124 08/01/23 0425 08/02/23 0628 08/03/23 0444 08/03/23 1557 08/04/23 0421  NA 131* 134*  133* 136 134*  --  138  K 5.3* 5.0  4.9 5.0 5.2* 4.9 4.4  CL 98 104  104 110 108  --  106   CO2 17* 16*  17* 18* 17*  --  22  GLUCOSE 165* 226*  231* 160* 196*  --  270*  BUN 112* 93*  93* 88* 74*  --  60*  CREATININE 3.69* 3.09*  2.98* 2.65* 2.22*  --  2.01*  CALCIUM 9.0 8.6*  8.6* 8.5* 8.7*  --  8.9  MG  --  2.5* 2.6* 2.7* 2.7* 2.6*  PHOS  --  3.9 3.8 4.1  --  3.1   Liver Function Tests: Recent Labs  Lab 07/31/23 1124 08/03/23 0444 08/04/23 0421  AST 19  --   --   ALT 20  --   --   ALKPHOS 72  --   --   BILITOT 1.5*  --   --   PROT 7.8  --   --   ALBUMIN 2.9* 2.2* 2.4*   CBG: Recent Labs  Lab 08/03/23 1932 08/03/23 2331 08/04/23 0259 08/04/23 0803 08/04/23 1129  GLUCAP 240* 223* 209* 241* 228*    Discharge time spent: greater than 30 minutes.  Signed: Pennie Banter, DO [facility] 08/04/2023

## 2023-08-04 NOTE — Progress Notes (Signed)
1545-Talked to pt about to see what he wanted to do and he still said he wants to leave so I got an AMA form and he signed it. I contacted the MD and and let her know. The pt. is A and O x4.

## 2023-08-04 NOTE — Progress Notes (Signed)
1410-Pt is refusing to keep his monitor on. He keeps pulling off his leads and fussing at staff.

## 2023-08-04 NOTE — Progress Notes (Signed)
Previous note at 1530.

## 2023-08-04 NOTE — Plan of Care (Signed)
  Problem: Clinical Measurements: Goal: Ability to maintain clinical measurements within normal limits will improve Outcome: Progressing Goal: Will remain free from infection Outcome: Progressing Goal: Diagnostic test results will improve Outcome: Progressing Goal: Respiratory complications will improve Outcome: Progressing   Problem: Coping: Goal: Ability to adjust to condition or change in health will improve Outcome: Not Progressing   Problem: Health Behavior/Discharge Planning: Goal: Ability to identify and utilize available resources and services will improve Outcome: Not Progressing Goal: Ability to manage health-related needs will improve Outcome: Not Progressing   Problem: Metabolic: Goal: Ability to maintain appropriate glucose levels will improve Outcome: Not Progressing   Problem: Education: Goal: Knowledge of General Education information will improve Description: Including pain rating scale, medication(s)/side effects and non-pharmacologic comfort measures Outcome: Not Progressing

## 2023-08-04 NOTE — Inpatient Diabetes Management (Signed)
Inpatient Diabetes Program Recommendations  AACE/ADA: New Consensus Statement on Inpatient Glycemic Control   Target Ranges:  Prepandial:   less than 140 mg/dL      Peak postprandial:   less than 180 mg/dL (1-2 hours)      Critically ill patients:  140 - 180 mg/dL    Latest Reference Range & Units 08/03/23 08:36 08/03/23 15:52 08/03/23 19:32 08/03/23 23:31 08/04/23 02:59  Glucose-Capillary 70 - 99 mg/dL 384 (H) 665 (H) 993 (H) 223 (H) 209 (H)   Review of Glycemic Control  Diabetes history: DM2 Outpatient Diabetes medications: Metformin 1000 mg BID Current orders for Inpatient glycemic control: Novolog 0-20 units Q4H; Solucortef 50 mg Q6H  Inpatient Diabetes Program Recommendations:    Insulin: If steroids are continued as ordered, please consider ordering Semglee 10 units Q24H and Novolog 4 units TID with meals for meal coverage if patient eats at least 50% of meals.  Thanks, Orlando Penner, RN, MSN, CDCES Diabetes Coordinator Inpatient Diabetes Program [phone] (Team Pager from 8am to 5pm)

## 2023-08-04 NOTE — Progress Notes (Signed)
Pt very aggressive throughout the night, not sleeping well and saying verbal threats to staff, told this RN he would beat up anyone who would try to tell him what to do. He would yell from inside his room demanding someone to get him a sandwich or graham crackers, pt was advised after eating multiple snacks about his blood glucose being high and controlling his sugar intake and pt refused to listen. Pt was willing to take his meds throughout the night and was able to calm down after this RN called his nephew over the phone to help pt calm down. Pt had attempted to leave AMA the day before, pt was advised to stay to complete treatment by MD and nephew yesterday.

## 2023-08-04 NOTE — Evaluation (Signed)
.Physical Therapy Evaluation Patient Details Name: Jeri Rawlins MRN: [id number] DOB: 11-Dec-1955 Today's Date: 08/04/2023  History of Present Illness  This is a 68 yo male who presented to [facility] ER on 02/16 from [facility] with confusion and poor po intake for several days.  Per ED notes it was reported by the nurse from the jail the pt has had foul smelling urine, and bacteria was present in the urine dip stick test. He was admitted to ICU with  acute septic shock due to ESBL e coli bacteremia UTI with Acute metabolic encephalopathy with underlying Chronic systo  Clinical Impression  Pt is a pleasant 68 year old male who was admitted for septic shock. Pt performs bed mobility with independence, transfers with mod I, and ambulation with cga and RW. Pt demonstrates deficits with strength/mobility/balance. Pt is not forthcoming with living arrangements, although per chart- came from jail. Unsure if he will be returning. Previously reports he was indep. Would benefit from skilled PT to address above deficits and promote optimal return to PLOF. Pt will continue to receive skilled PT services while admitted and will defer to TOC/care team for updates regarding disposition planning.       If plan is discharge home, recommend the following: A little help with walking and/or transfers;A little help with bathing/dressing/bathroom;Help with stairs or ramp for entrance;Supervision due to cognitive status   Can travel by private vehicle        Equipment Recommendations Rolling walker (2 wheels)  Recommendations for Other Services       Functional Status Assessment Patient has had a recent decline in their functional status and demonstrates the ability to make significant improvements in function in a reasonable and predictable amount of time.     Precautions / Restrictions Precautions Precautions: Fall Recall of Precautions/Restrictions: Intact Restrictions Weight Bearing Restrictions  Per Provider Order: No      Mobility  Bed Mobility Overal bed mobility: Independent             General bed mobility comments: impulsive and needs cues for safety    Transfers Overall transfer level: Modified independent Equipment used: Rolling walker (2 wheels)               General transfer comment: upright posture. Increased sway noted    Ambulation/Gait Ambulation/Gait assistance: Contact guard assist Gait Distance (Feet): 100 Feet Assistive device: Rolling walker (2 wheels) Gait Pattern/deviations: Step-through pattern       General Gait Details: ambulated in hallway with unsteadiness and heavy WBing through B UE. Needs cues for keeping close to AD  Stairs            Wheelchair Mobility     Tilt Bed    Modified Rankin (Stroke Patients Only)       Balance Overall balance assessment: Needs assistance Sitting-balance support: Feet supported Sitting balance-Leahy Scale: Good     Standing balance support: Bilateral upper extremity supported Standing balance-Leahy Scale: Fair                               Pertinent Vitals/Pain Pain Assessment Pain Assessment: No/denies pain    Home Living Family/patient expects to be discharged to:: [facility]                   Additional Comments: came from jail. Unsure if he will return to jail    Prior Function Prior Level of Function : Independent/Modified Independent  Mobility Comments: indep with all ADLs ADLs Comments: indep     Extremity/Trunk Assessment   Upper Extremity Assessment Upper Extremity Assessment: Overall WFL for tasks assessed    Lower Extremity Assessment Lower Extremity Assessment: Generalized weakness (B LE grossly 3+/5)       Communication   Communication Communication: No apparent difficulties    Cognition Arousal: Alert Behavior During Therapy: Impulsive   PT - Cognitive impairments: No apparent impairments                          Following commands: Intact       Cueing Cueing Techniques: Verbal cues     General Comments      Exercises Other Exercises Other Exercises: pt ambulated over to toilet, able to perform transfer with cga. Once standing, performed LB bath with supervision. Slight unsteadiness, however no true LOB noted.   Assessment/Plan    PT Assessment Patient needs continued PT services  PT Problem List Decreased strength;Decreased activity tolerance;Decreased balance;Decreased mobility;Decreased safety awareness;Decreased knowledge of use of DME       PT Treatment Interventions DME instruction;Gait training;Therapeutic exercise;Balance training    PT Goals (Current goals can be found in the Care Plan section)  Acute Rehab PT Goals Patient Stated Goal: to get out of the hospital PT Goal Formulation: With patient Time For Goal Achievement: 08/18/23 Potential to Achieve Goals: Good    Frequency Min 1X/week     Co-evaluation               AM-PAC PT "6 Clicks" Mobility  Outcome Measure Help needed turning from your back to your side while in a flat bed without using bedrails?: None Help needed moving from lying on your back to sitting on the side of a flat bed without using bedrails?: None Help needed moving to and from a bed to a chair (including a wheelchair)?: A Little Help needed standing up from a chair using your arms (e.g., wheelchair or bedside chair)?: A Little Help needed to walk in hospital room?: A Little Help needed climbing 3-5 steps with a railing? : A Lot 6 Click Score: 19    End of Session   Activity Tolerance: Patient tolerated treatment well Patient left: in bed;with bed alarm set Nurse Communication: Mobility status PT Visit Diagnosis: Muscle weakness (generalized) (M62.81);Difficulty in walking, not elsewhere classified (R26.2)    Time: 5621-3086 PT Time Calculation (min) (ACUTE ONLY): 28 min   Charges:   PT Evaluation $PT  Eval Low Complexity: 1 Low PT Treatments $Gait Training: 8-22 mins PT General Charges $$ ACUTE PT VISIT: 1 Visit         Elizabeth Palau, PT, DPT, GCS [phone]   Breeze Berringer 08/04/2023, 11:26 AM

## 2023-08-04 NOTE — Evaluation (Signed)
Occupational Therapy Evaluation Patient Details Name: Jeremy [name] MRN: [id number] DOB: 04/22/1956 Today's Date: 08/04/2023   History of Present Illness   Pt is a 68 year old male admitted with with acute septic shock due to ESBL e coli bacteremia UTI with Acute metabolic encephalopathy with underlying Chronic systolic CHF     PMH significant for  Ischemic cardiomyopathy, PE, DVT, paroxysmal atrial fibrillation, CAD s/p CABG x1, and HTN     Clinical Impressions Chart reviewed, pt greeted in bed, oriented to self and place, but unwilling to provide PLOF/home set up details upon questioning besides reporting he is independent in ADL and he is not going back to jail and will find himself an apartment but it is none of this therapist's business. Pt is impulsive throughout. Bed mobility completed with supervision, STS with CGA, amb in room with supervision-CGA, grooming in standing with RW with supervision.Anticipate MIN A for LB dressing. Pt presents with deficits in activity tolerance, balance, cognition, affecting safe and optimal Adl completion. Pt will benefit from acute OT to address deficits and to facilitate optimal ADL performance. OT will follow acutely.     If plan is discharge home, recommend the following:   A little help with walking and/or transfers;A little help with bathing/dressing/bathroom;Help with stairs or ramp for entrance     Functional Status Assessment   Patient has had a recent decline in their functional status and demonstrates the ability to make significant improvements in function in a reasonable and predictable amount of time.     Equipment Recommendations   BSC/3in1;Other (comment) (2WW)     Recommendations for Other Services         Precautions/Restrictions   Precautions Precautions: Fall Recall of Precautions/Restrictions: Intact     Mobility Bed Mobility Overal bed mobility: Needs Assistance Bed Mobility: Supine to Sit, Sit to  Supine     Supine to sit: Supervision Sit to supine: Supervision        Transfers Overall transfer level: Needs assistance Equipment used: Rolling walker (2 wheels) Transfers: Sit to/from Stand Sit to Stand: Supervision, Contact guard assist                  Balance Overall balance assessment: Needs assistance Sitting-balance support: Feet supported Sitting balance-Leahy Scale: Good     Standing balance support: Bilateral upper extremity supported Standing balance-Leahy Scale: Fair                             ADL either performed or assessed with clinical judgement   ADL Overall ADL's : Needs assistance/impaired     Grooming: Wash/dry hands;Standing;Supervision/safety Grooming Details (indicate cue type and reason): RW at sink level             Lower Body Dressing: Minimal assistance;Sitting/lateral leans;Sit to/from stand   Toilet Transfer: Contact guard assist;Rolling walker (2 wheels);Ambulation Toilet Transfer Details (indicate cue type and reason): simulated Toileting- Clothing Manipulation and Hygiene: Supervision/safety;Sitting/lateral lean       Functional mobility during ADLs: Contact guard assist;Rolling walker (2 wheels) (approx 20' in room)       Vision Patient Visual Report: No change from baseline       Perception         Praxis         Pertinent Vitals/Pain Pain Assessment Pain Assessment: No/denies pain     Extremity/Trunk Assessment Upper Extremity Assessment Upper Extremity Assessment: Overall WFL for tasks assessed   Lower  Extremity Assessment Lower Extremity Assessment: Generalized weakness       Communication Communication Communication: No apparent difficulties   Cognition Arousal: Alert Behavior During Therapy: Impulsive Cognition: Cognition impaired, No family/caregiver present to determine baseline     Awareness: Intellectual awareness impaired   Attention impairment (select first level of  impairment): Selective attention   OT - Cognition Comments: Pt is tangential and impulsive throughout session                 Following commands: Intact       Cueing  General Comments   Cueing Techniques: Verbal cues;Tactile cues;Visual cues      Exercises Other Exercises Other Exercises: edu re: role of OT, role of rehab, discharge recommendations   Shoulder Instructions      Home Living Family/patient expects to be discharged to:: [facility]                                 Additional Comments: pt is from jail, reports he will get an apartment but not to ask him anymore questions about PLOF/home      Prior Functioning/Environment Prior Level of Function : Independent/Modified Independent             Mobility Comments: pt reports amb with no AD ADLs Comments: pt reports MOD I-I with ADL/IADL, will need to confirm    OT Problem List: Decreased activity tolerance;Decreased strength;Impaired balance (sitting and/or standing);Decreased knowledge of use of DME or AE;Decreased safety awareness   OT Treatment/Interventions: Self-care/ADL training;Therapeutic exercise;Patient/family education;DME and/or AE instruction;Therapeutic activities;Energy conservation      OT Goals(Current goals can be found in the care plan section)   Acute Rehab OT Goals Patient Stated Goal: get back to bed OT Goal Formulation: With patient Time For Goal Achievement: 08/18/23 Potential to Achieve Goals: Good ADL Goals Pt Will Perform Grooming: with modified independence Pt Will Perform Lower Body Dressing: with modified independence;sit to/from stand;sitting/lateral leans Pt Will Transfer to Toilet: with modified independence;ambulating Pt Will Perform Toileting - Clothing Manipulation and hygiene: with modified independence;sit to/from stand;sitting/lateral leans   OT Frequency:  Min 1X/week    Co-evaluation              AM-PAC OT "6 Clicks" Daily  Activity     Outcome Measure Help from another person eating meals?: None Help from another person taking care of personal grooming?: None Help from another person toileting, which includes using toliet, bedpan, or urinal?: None Help from another person bathing (including washing, rinsing, drying)?: A Little Help from another person to put on and taking off regular upper body clothing?: None Help from another person to put on and taking off regular lower body clothing?: A Little 6 Click Score: 22   End of Session Equipment Utilized During Treatment: Rolling walker (2 wheels) Nurse Communication: Mobility status  Activity Tolerance: Patient tolerated treatment well Patient left: in bed;with call bell/phone within reach;with bed alarm set  OT Visit Diagnosis: Other abnormalities of gait and mobility (R26.89);Muscle weakness (generalized) (M62.81);Unsteadiness on feet (R26.81)                Time: 4098-1191 OT Time Calculation (min): 16 min Charges:  OT General Charges $OT Visit: 1 Visit OT Evaluation $OT Eval Moderate Complexity: 1 Mod Oleta Mouse, OTD OTR/L  08/04/23, 1:08 PM

## 2023-08-04 NOTE — Progress Notes (Signed)
Jeremy [name] is threatening staff. He told one of the other nurses that he wanted to "shank" him. I talked to pt after he calmed down what was going on and he said that he wanted to leave. I explained to the pt that it would be better if he would stay and finish his treatment of antibiotics for his infection and after he was well he would be discharged. Pt was animate about leaving. I told him that I need to let the MD know what was going on. Pt said it was ok to discuss with the MD.

## 2023-08-04 NOTE — Progress Notes (Addendum)
PHARMACY CONSULT NOTE - FOLLOW UP  Pharmacy Consult for Electrolyte Monitoring and Replacement   Recent Labs: Potassium (mmol/L)  Date Value  08/04/2023 4.4  10/03/2011 3.8   Magnesium (mg/dL)  Date Value  16/03/9603 2.6 (H)   Calcium (mg/dL)  Date Value  54/02/8118 8.9   Calcium, Total (mg/dL)  Date Value  14/78/2956 8.7   Albumin (g/dL)  Date Value  21/30/8657 2.4 (L)  10/02/2011 3.6   Phosphorus (mg/dL)  Date Value  84/69/6295 3.1   Sodium (mmol/L)  Date Value  08/04/2023 138  10/03/2011 146 (H)   Assessment: MR is a 68 yo male who presented from jail with foul smelling urine and confusion with a history of poor oral intake. Pharmacy has been consulted to monitor with patient's electrolytes while in the ICU.   Fluids: NA Medications: NA Diet: Healthy Carb  Goal of Therapy:  Electrolytes WNL Today, 08/04/2023 K = 4.4 Mg = 2.6 Phos = 3.1  Plan:  No replacement indicated at this time Because this consult was generated as part of an ICU order set and patient is transferring pharmacy will sign off for now. Please feel free to reach out if any further assistance is needed Continue to monitor electrolytes with AM labs   Effie Shy, PharmD Pharmacy Resident  08/04/2023 6:25 AM

## 2023-08-04 NOTE — Progress Notes (Signed)
1645-Pt left AMA. Pt is alert and oriented x 4.

## 2023-08-05 LAB — CULTURE, BLOOD (ROUTINE X 2): Culture: NO GROWTH

## 2023-08-06 ENCOUNTER — Other Ambulatory Visit: Payer: Self-pay

## 2023-08-06 ENCOUNTER — Inpatient Hospital Stay
Admission: EM | Admit: 2023-08-06 | Discharge: 2023-08-11 | DRG: 871 | Disposition: A | Discharge status: Home / Self Care | Payer: 59 | Attending: Hospitalist | Admitting: Hospitalist

## 2023-08-06 ENCOUNTER — Emergency Department: Payer: 59

## 2023-08-06 DIAGNOSIS — Z951 Presence of aortocoronary bypass graft: Secondary | ICD-10-CM

## 2023-08-06 DIAGNOSIS — Z7901 Long term (current) use of anticoagulants: Secondary | ICD-10-CM

## 2023-08-06 DIAGNOSIS — Z7982 Long term (current) use of aspirin: Secondary | ICD-10-CM

## 2023-08-06 DIAGNOSIS — Z794 Long term (current) use of insulin: Secondary | ICD-10-CM

## 2023-08-06 DIAGNOSIS — I480 Paroxysmal atrial fibrillation: Secondary | ICD-10-CM | POA: Insufficient documentation

## 2023-08-06 DIAGNOSIS — R002 Palpitations: Secondary | ICD-10-CM | POA: Diagnosis not present

## 2023-08-06 DIAGNOSIS — A4150 Gram-negative sepsis, unspecified: Principal | ICD-10-CM

## 2023-08-06 DIAGNOSIS — B9629 Other Escherichia coli [E. coli] as the cause of diseases classified elsewhere: Principal | ICD-10-CM

## 2023-08-06 DIAGNOSIS — Z1152 Encounter for screening for COVID-19: Secondary | ICD-10-CM

## 2023-08-06 DIAGNOSIS — Z8249 Family history of ischemic heart disease and other diseases of the circulatory system: Secondary | ICD-10-CM

## 2023-08-06 DIAGNOSIS — G9341 Metabolic encephalopathy: Secondary | ICD-10-CM | POA: Diagnosis present

## 2023-08-06 DIAGNOSIS — B9620 Unspecified Escherichia coli [E. coli] as the cause of diseases classified elsewhere: Secondary | ICD-10-CM | POA: Diagnosis present

## 2023-08-06 DIAGNOSIS — Z79899 Other long term (current) drug therapy: Secondary | ICD-10-CM

## 2023-08-06 DIAGNOSIS — Z7984 Long term (current) use of oral hypoglycemic drugs: Secondary | ICD-10-CM

## 2023-08-06 DIAGNOSIS — K219 Gastro-esophageal reflux disease without esophagitis: Secondary | ICD-10-CM | POA: Insufficient documentation

## 2023-08-06 DIAGNOSIS — Z6830 Body mass index (BMI) 30.0-30.9, adult: Secondary | ICD-10-CM

## 2023-08-06 DIAGNOSIS — Z1612 Extended spectrum beta lactamase (ESBL) resistance: Secondary | ICD-10-CM | POA: Diagnosis present

## 2023-08-06 DIAGNOSIS — N390 Urinary tract infection, site not specified: Principal | ICD-10-CM | POA: Diagnosis present

## 2023-08-06 DIAGNOSIS — I10 Essential (primary) hypertension: Secondary | ICD-10-CM | POA: Diagnosis present

## 2023-08-06 DIAGNOSIS — E875 Hyperkalemia: Secondary | ICD-10-CM | POA: Diagnosis present

## 2023-08-06 DIAGNOSIS — I2510 Atherosclerotic heart disease of native coronary artery without angina pectoris: Secondary | ICD-10-CM | POA: Insufficient documentation

## 2023-08-06 DIAGNOSIS — N1831 Chronic kidney disease, stage 3a: Secondary | ICD-10-CM | POA: Diagnosis present

## 2023-08-06 DIAGNOSIS — E785 Hyperlipidemia, unspecified: Secondary | ICD-10-CM | POA: Insufficient documentation

## 2023-08-06 DIAGNOSIS — I130 Hypertensive heart and chronic kidney disease with heart failure and stage 1 through stage 4 chronic kidney disease, or unspecified chronic kidney disease: Secondary | ICD-10-CM | POA: Diagnosis present

## 2023-08-06 DIAGNOSIS — E669 Obesity, unspecified: Secondary | ICD-10-CM | POA: Diagnosis present

## 2023-08-06 DIAGNOSIS — E1122 Type 2 diabetes mellitus with diabetic chronic kidney disease: Secondary | ICD-10-CM

## 2023-08-06 DIAGNOSIS — Z5901 Sheltered homelessness: Secondary | ICD-10-CM

## 2023-08-06 DIAGNOSIS — D649 Anemia, unspecified: Secondary | ICD-10-CM | POA: Diagnosis present

## 2023-08-06 DIAGNOSIS — A419 Sepsis, unspecified organism: Secondary | ICD-10-CM | POA: Diagnosis present

## 2023-08-06 DIAGNOSIS — R6521 Severe sepsis with septic shock: Secondary | ICD-10-CM | POA: Diagnosis present

## 2023-08-06 DIAGNOSIS — I509 Heart failure, unspecified: Secondary | ICD-10-CM | POA: Diagnosis present

## 2023-08-06 DIAGNOSIS — F17210 Nicotine dependence, cigarettes, uncomplicated: Secondary | ICD-10-CM | POA: Diagnosis present

## 2023-08-06 HISTORY — DX: Heart failure, unspecified: I50.9

## 2023-08-06 LAB — CBC
HCT: 34.6 % — ABNORMAL LOW (ref 39.0–52.0)
Hemoglobin: 11.2 g/dL — ABNORMAL LOW (ref 13.0–17.0)
MCH: 31.2 pg (ref 26.0–34.0)
MCHC: 32.4 g/dL (ref 30.0–36.0)
MCV: 96.4 fL (ref 80.0–100.0)
Platelets: 610 10*3/uL — ABNORMAL HIGH (ref 150–400)
RBC: 3.59 MIL/uL — ABNORMAL LOW (ref 4.22–5.81)
RDW: 14.6 % (ref 11.5–15.5)
WBC: 13.5 10*3/uL — ABNORMAL HIGH (ref 4.0–10.5)
nRBC: 0 % (ref 0.0–0.2)

## 2023-08-06 LAB — URINALYSIS, W/ REFLEX TO CULTURE (INFECTION SUSPECTED)
Bacteria, UA: NONE SEEN
Bilirubin Urine: NEGATIVE
Glucose, UA: 50 mg/dL — AB
Ketones, ur: NEGATIVE mg/dL
Nitrite: NEGATIVE
Protein, ur: NEGATIVE mg/dL
Specific Gravity, Urine: 1.01 (ref 1.005–1.030)
pH: 5 (ref 5.0–8.0)

## 2023-08-06 LAB — RESP PANEL BY RT-PCR (RSV, FLU A&B, COVID)  RVPGX2
Influenza A by PCR: NEGATIVE
Influenza B by PCR: NEGATIVE
Resp Syncytial Virus by PCR: NEGATIVE
SARS Coronavirus 2 by RT PCR: NEGATIVE

## 2023-08-06 LAB — LACTIC ACID, PLASMA: Lactic Acid, Venous: 2.6 mmol/L (ref 0.5–1.9)

## 2023-08-06 LAB — TROPONIN I (HIGH SENSITIVITY)
Troponin I (High Sensitivity): 19 ng/L — ABNORMAL HIGH (ref <18)
Troponin I (High Sensitivity): 21 ng/L — ABNORMAL HIGH (ref <18)

## 2023-08-06 LAB — HEPATIC FUNCTION PANEL
ALT: 20 U/L (ref 0–44)
AST: 20 U/L (ref 15–41)
Albumin: 3 g/dL — ABNORMAL LOW (ref 3.5–5.0)
Alkaline Phosphatase: 66 U/L (ref 38–126)
Bilirubin, Direct: 0.2 mg/dL (ref 0.0–0.2)
Indirect Bilirubin: 0.5 mg/dL (ref 0.3–0.9)
Total Bilirubin: 0.7 mg/dL (ref 0.0–1.2)
Total Protein: 7.2 g/dL (ref 6.5–8.1)

## 2023-08-06 LAB — BASIC METABOLIC PANEL
Anion gap: 11 (ref 5–15)
BUN: 38 mg/dL — ABNORMAL HIGH (ref 8–23)
CO2: 18 mmol/L — ABNORMAL LOW (ref 22–32)
Calcium: 9.1 mg/dL (ref 8.9–10.3)
Chloride: 108 mmol/L (ref 98–111)
Creatinine, Ser: 1.47 mg/dL — ABNORMAL HIGH (ref 0.61–1.24)
GFR, Estimated: 52 mL/min — ABNORMAL LOW (ref >60)
Glucose, Bld: 125 mg/dL — ABNORMAL HIGH (ref 70–99)
Potassium: 5.4 mmol/L — ABNORMAL HIGH (ref 3.5–5.1)
Sodium: 137 mmol/L (ref 135–145)

## 2023-08-06 MED ORDER — SODIUM CHLORIDE 0.9 % IV BOLUS (SEPSIS)
1000.0000 mL | Freq: Once | INTRAVENOUS | Status: AC
  Administered 2023-08-06: 1000 mL via INTRAVENOUS

## 2023-08-06 MED ORDER — SODIUM CHLORIDE 0.9 % IV SOLN
1.0000 g | Freq: Once | INTRAVENOUS | Status: AC
  Administered 2023-08-06: 1 g via INTRAVENOUS
  Filled 2023-08-06: qty 20

## 2023-08-06 NOTE — ED Provider Notes (Signed)
 [facility] Provider Note    Event Date/Time   First MD Initiated Contact with Patient 08/06/23 Jeremy [name]     (approximate)   History   Palpitations and Homeless   HPI  Jeremy [name] is a 68 year old male presenting to the ER for palpitations.  In triage, patient reported palpitations and being hungry.  To me, he knows he is in the hospital, does not seem to recollect that he recently left AMA.  He asked me to help take care of his pets, unable to tell me where they are, seems generally confused about why he is in the ER.  I do see that patient was discharged AGAINST MEDICAL ADVICE on 08/04/2023, but the summary has not been signed.  I do also note that a discharge summary was completed on 2/19.  At that time, patient presented with septic shock secondary to ESBL E. coli UTI with an acute metabolic encephalopathy.  While in the ICU, patient did express a desire to leave AGAINST MEDICAL ADVICE and was plan for discharge, but ultimately was transferred to the floor where it appears he did ultimately leave AMA.       Physical Exam   Triage Vital Signs: ED Triage Vitals  Encounter Vitals Group     BP 08/06/23 1848 (!) 135/98     Systolic BP Percentile --      Diastolic BP Percentile --      Pulse Rate 08/06/23 1848 (!) 108     Resp 08/06/23 1848 (!) 22     Temp 08/06/23 1848 98.1 F (36.7 C)     Temp Source 08/06/23 1848 Oral     SpO2 08/06/23 1853 100 %     Weight --      Height --      Head Circumference --      Peak Flow --      Pain Score 08/06/23 1851 3     Pain Loc --      Pain Education --      Exclude from Growth Chart --     Most recent vital signs: Vitals:   08/06/23 2130 08/06/23 2230  BP: 92/71 105/70  Pulse: (!) 123 (!) 128  Resp: 13 19  Temp:    SpO2: 96% 98%     General: Awake, interactive CV:  Tachycardic Resp:  Unlabored respirations, lungs clear to auscultation Abd:  Nondistended,  soft, no appreciable  tenderness Neuro:  Oriented to self, knows he is in the hospital, not oriented to situation, unable to tell me the month or year, 5 or 5 strength in bilateral upper and lower extremities   ED Results / Procedures / Treatments   Labs (all labs ordered are listed, but only abnormal results are displayed) Labs Reviewed  BASIC METABOLIC PANEL - Abnormal; Notable for the following components:      Result Value   Potassium 5.4 (*)    CO2 18 (*)    Glucose, Bld 125 (*)    BUN 38 (*)    Creatinine, Ser 1.47 (*)    GFR, Estimated 52 (*)    All other components within normal limits  CBC - Abnormal; Notable for the following components:   WBC 13.5 (*)    RBC 3.59 (*)    Hemoglobin 11.2 (*)    HCT 34.6 (*)    Platelets 610 (*)    All other components within normal limits  LACTIC ACID, PLASMA - Abnormal; Notable for the following components:  Lactic Acid, Venous 2.6 (*)    All other components within normal limits  HEPATIC FUNCTION PANEL - Abnormal; Notable for the following components:   Albumin 3.0 (*)    All other components within normal limits  URINALYSIS, W/ REFLEX TO CULTURE (INFECTION SUSPECTED) - Abnormal; Notable for the following components:   Color, Urine YELLOW (*)    APPearance CLEAR (*)    Glucose, UA 50 (*)    Hgb urine dipstick MODERATE (*)    Leukocytes,Ua SMALL (*)    All other components within normal limits  TROPONIN I (HIGH SENSITIVITY) - Abnormal; Notable for the following components:   Troponin I (High Sensitivity) 19 (*)    All other components within normal limits  TROPONIN I (HIGH SENSITIVITY) - Abnormal; Notable for the following components:   Troponin I (High Sensitivity) 21 (*)    All other components within normal limits  RESP PANEL BY RT-PCR (RSV, FLU A&B, COVID)  [id number]  CULTURE, BLOOD (ROUTINE X 2)  CULTURE, BLOOD (ROUTINE X 2)  URINE CULTURE  LACTIC ACID, PLASMA     EKG EKG independently reviewed interpreted by myself (ER attending)  demonstrates:  EKG demonstrates sinus tachycardia at a rate of 126, PR 186, QRS 100, QTc 411, no acute ST changes  RADIOLOGY Imaging independently reviewed and interpreted by myself demonstrates:  CXR without focal consolidation  PROCEDURES:  Critical Care performed: Yes, see critical care procedure note(s)  CRITICAL CARE Performed by: Trinna Post   Total critical care time: 32 minutes  Critical care time was exclusive of separately billable procedures and treating other patients.  Critical care was necessary to treat or prevent imminent or life-threatening deterioration.  Critical care was time spent personally by me on the following activities: development of treatment plan with patient and/or surrogate as well as nursing, discussions with consultants, evaluation of patient's response to treatment, examination of patient, obtaining history from patient or surrogate, ordering and performing treatments and interventions, ordering and review of laboratory studies, ordering and review of radiographic studies, pulse oximetry and re-evaluation of patient's condition.    Procedures   MEDICATIONS ORDERED IN ED: Medications  sodium chloride 0.9 % bolus 1,000 mL (0 mLs Intravenous Stopped 08/06/23 2241)  meropenem (MERREM) 1 g in sodium chloride 0.9 % 100 mL IVPB (0 g Intravenous Stopped 08/06/23 2241)     IMPRESSION / MDM / ASSESSMENT AND PLAN / ED COURSE  I reviewed the triage vital signs and the nursing notes.  Differential diagnosis includes, but is not limited to, ongoing sepsis from recent ESBL UTI, arrhythmia, anemia, electrolyte abnormality  Patient's presentation is most consistent with acute presentation with potential threat to life or bodily function.  68 year old male presenting tachycardic after recent admission for ESBL UTI.  Continues to meet sepsis criteria.  Sepsis orders initiated demonstrating persistent leukocytosis, lactic acidosis.  Ordered for empiric meropenem  based on most recent admission data.  Also ordered for a liter of IV fluids.  Patient is confused on my exam, was also noted during recent admission.  I do not currently feel he has decision-making capacity, but I did discuss admission and he verbally agreed.  Will reach out to hospitalist team.      FINAL CLINICAL IMPRESSION(S) / ED DIAGNOSES   Final diagnoses:  UTI due to extended-spectrum beta lactamase (ESBL) producing Escherichia coli  Palpitations     Rx / DC Orders   ED Discharge Orders     None  Note:  This document was prepared using Dragon voice recognition software and may include unintentional dictation errors.   Trinna Post, MD 08/07/23 [phone]

## 2023-08-06 NOTE — Progress Notes (Signed)
 CODE SEPSIS - PHARMACY COMMUNICATION  **Broad Spectrum Antibiotics should be administered within 1 hour of Sepsis diagnosis**  Time Code Sepsis Called/Page Received: 2020  Antibiotics Ordered: Meropenem  Time of 1st antibiotic administration: 2112  Additional action taken by pharmacy: Messaged nurse  If necessary, Name of Provider/Nurse Contacted: Benjamine Mola, RN    Merryl Hacker ,PharmD Clinical Pharmacist  08/06/2023  8:25 PM

## 2023-08-06 NOTE — ED Triage Notes (Signed)
 Pt to ED for "my heart is beating out of my chest". States just got out of hospital yesterday. Pt also complains of being hungry and cold. Pt is homeless, brought here by Emergency planning/management officer. Asking for sandwich repeatedly. HR is 126, EKG shown to EDP. Blue top sent. No meds taken today.took diuretic and blood thinner yesterday.   Skin is dry. Lab called for bloodwork, triage tech unable to get labs.

## 2023-08-06 NOTE — Progress Notes (Signed)
 ED Pharmacy Antibiotic Sign Off An antibiotic consult was received from an ED provider for meropenem per pharmacy dosing for ESBL UTI. A chart review was completed to assess appropriateness.   The following one time order(s) were placed:  Meropenem 1 g IV x 1  Further antibiotic and/or antibiotic pharmacy consults should be ordered by the admitting provider if indicated.   Thank you for allowing pharmacy to be a part of this patient's care.   Merryl Hacker, [facility]  Clinical Pharmacist 08/06/23 8:24 PM

## 2023-08-06 NOTE — ED Notes (Signed)
 Fluids not finished at this time. Will draw second lactic when complete.

## 2023-08-06 NOTE — Sepsis Progress Note (Signed)
 Elink following for sepsis protocol.

## 2023-08-07 DIAGNOSIS — A4150 Gram-negative sepsis, unspecified: Secondary | ICD-10-CM | POA: Diagnosis present

## 2023-08-07 DIAGNOSIS — G9341 Metabolic encephalopathy: Secondary | ICD-10-CM | POA: Diagnosis present

## 2023-08-07 DIAGNOSIS — E785 Hyperlipidemia, unspecified: Secondary | ICD-10-CM

## 2023-08-07 DIAGNOSIS — K219 Gastro-esophageal reflux disease without esophagitis: Secondary | ICD-10-CM

## 2023-08-07 DIAGNOSIS — N1831 Chronic kidney disease, stage 3a: Secondary | ICD-10-CM | POA: Diagnosis present

## 2023-08-07 DIAGNOSIS — B9620 Unspecified Escherichia coli [E. coli] as the cause of diseases classified elsewhere: Secondary | ICD-10-CM | POA: Diagnosis present

## 2023-08-07 DIAGNOSIS — I130 Hypertensive heart and chronic kidney disease with heart failure and stage 1 through stage 4 chronic kidney disease, or unspecified chronic kidney disease: Secondary | ICD-10-CM | POA: Diagnosis present

## 2023-08-07 DIAGNOSIS — R000 Tachycardia, unspecified: Secondary | ICD-10-CM | POA: Diagnosis not present

## 2023-08-07 DIAGNOSIS — N390 Urinary tract infection, site not specified: Secondary | ICD-10-CM

## 2023-08-07 DIAGNOSIS — Z794 Long term (current) use of insulin: Secondary | ICD-10-CM | POA: Diagnosis not present

## 2023-08-07 DIAGNOSIS — Z1152 Encounter for screening for COVID-19: Secondary | ICD-10-CM | POA: Diagnosis not present

## 2023-08-07 DIAGNOSIS — R6521 Severe sepsis with septic shock: Secondary | ICD-10-CM | POA: Diagnosis present

## 2023-08-07 DIAGNOSIS — I2510 Atherosclerotic heart disease of native coronary artery without angina pectoris: Secondary | ICD-10-CM | POA: Insufficient documentation

## 2023-08-07 DIAGNOSIS — I509 Heart failure, unspecified: Secondary | ICD-10-CM | POA: Diagnosis present

## 2023-08-07 DIAGNOSIS — N183 Chronic kidney disease, stage 3 unspecified: Secondary | ICD-10-CM | POA: Diagnosis not present

## 2023-08-07 DIAGNOSIS — D649 Anemia, unspecified: Secondary | ICD-10-CM | POA: Diagnosis present

## 2023-08-07 DIAGNOSIS — I4892 Unspecified atrial flutter: Secondary | ICD-10-CM | POA: Diagnosis not present

## 2023-08-07 DIAGNOSIS — E1122 Type 2 diabetes mellitus with diabetic chronic kidney disease: Secondary | ICD-10-CM

## 2023-08-07 DIAGNOSIS — F17210 Nicotine dependence, cigarettes, uncomplicated: Secondary | ICD-10-CM | POA: Diagnosis present

## 2023-08-07 DIAGNOSIS — I480 Paroxysmal atrial fibrillation: Secondary | ICD-10-CM | POA: Insufficient documentation

## 2023-08-07 DIAGNOSIS — I10 Essential (primary) hypertension: Secondary | ICD-10-CM

## 2023-08-07 DIAGNOSIS — R42 Dizziness and giddiness: Secondary | ICD-10-CM | POA: Diagnosis present

## 2023-08-07 DIAGNOSIS — E875 Hyperkalemia: Secondary | ICD-10-CM | POA: Diagnosis present

## 2023-08-07 DIAGNOSIS — A419 Sepsis, unspecified organism: Secondary | ICD-10-CM | POA: Diagnosis present

## 2023-08-07 DIAGNOSIS — Z1612 Extended spectrum beta lactamase (ESBL) resistance: Secondary | ICD-10-CM | POA: Diagnosis present

## 2023-08-07 DIAGNOSIS — I5020 Unspecified systolic (congestive) heart failure: Secondary | ICD-10-CM | POA: Diagnosis not present

## 2023-08-07 DIAGNOSIS — Z5901 Sheltered homelessness: Secondary | ICD-10-CM | POA: Diagnosis not present

## 2023-08-07 DIAGNOSIS — Z7982 Long term (current) use of aspirin: Secondary | ICD-10-CM | POA: Diagnosis not present

## 2023-08-07 DIAGNOSIS — Z7901 Long term (current) use of anticoagulants: Secondary | ICD-10-CM | POA: Diagnosis not present

## 2023-08-07 DIAGNOSIS — R002 Palpitations: Secondary | ICD-10-CM | POA: Diagnosis present

## 2023-08-07 DIAGNOSIS — Z79899 Other long term (current) drug therapy: Secondary | ICD-10-CM | POA: Diagnosis not present

## 2023-08-07 DIAGNOSIS — J449 Chronic obstructive pulmonary disease, unspecified: Secondary | ICD-10-CM | POA: Diagnosis not present

## 2023-08-07 DIAGNOSIS — E669 Obesity, unspecified: Secondary | ICD-10-CM | POA: Diagnosis present

## 2023-08-07 HISTORY — DX: Gastro-esophageal reflux disease without esophagitis: K21.9

## 2023-08-07 HISTORY — DX: Hyperlipidemia, unspecified: E78.5

## 2023-08-07 LAB — CBC
HCT: 33.6 % — ABNORMAL LOW (ref 39.0–52.0)
Hemoglobin: 10.9 g/dL — ABNORMAL LOW (ref 13.0–17.0)
MCH: 31.2 pg (ref 26.0–34.0)
MCHC: 32.4 g/dL (ref 30.0–36.0)
MCV: 96.3 fL (ref 80.0–100.0)
Platelets: 541 10*3/uL — ABNORMAL HIGH (ref 150–400)
RBC: 3.49 MIL/uL — ABNORMAL LOW (ref 4.22–5.81)
RDW: 15 % (ref 11.5–15.5)
WBC: 10.6 10*3/uL — ABNORMAL HIGH (ref 4.0–10.5)
nRBC: 0 % (ref 0.0–0.2)

## 2023-08-07 LAB — BASIC METABOLIC PANEL
Anion gap: 9 (ref 5–15)
BUN: 31 mg/dL — ABNORMAL HIGH (ref 8–23)
CO2: 19 mmol/L — ABNORMAL LOW (ref 22–32)
Calcium: 8.7 mg/dL — ABNORMAL LOW (ref 8.9–10.3)
Chloride: 112 mmol/L — ABNORMAL HIGH (ref 98–111)
Creatinine, Ser: 1.44 mg/dL — ABNORMAL HIGH (ref 0.61–1.24)
GFR, Estimated: 54 mL/min — ABNORMAL LOW (ref >60)
Glucose, Bld: 166 mg/dL — ABNORMAL HIGH (ref 70–99)
Potassium: 4.8 mmol/L (ref 3.5–5.1)
Sodium: 140 mmol/L (ref 135–145)

## 2023-08-07 LAB — PROTIME-INR
INR: 1.2 (ref 0.8–1.2)
Prothrombin Time: 15.8 s — ABNORMAL HIGH (ref 11.4–15.2)

## 2023-08-07 LAB — LACTIC ACID, PLASMA: Lactic Acid, Venous: 2.2 mmol/L (ref 0.5–1.9)

## 2023-08-07 LAB — CORTISOL-AM, BLOOD: Cortisol - AM: 21.6 ug/dL (ref 6.7–22.6)

## 2023-08-07 MED ORDER — TORSEMIDE 20 MG PO TABS
20.0000 mg | ORAL_TABLET | Freq: Every day | ORAL | Status: DC
  Administered 2023-08-07: 20 mg via ORAL
  Filled 2023-08-07: qty 1

## 2023-08-07 MED ORDER — ACETAMINOPHEN 325 MG PO TABS
650.0000 mg | ORAL_TABLET | Freq: Four times a day (QID) | ORAL | Status: DC | PRN
  Administered 2023-08-08: 650 mg via ORAL
  Filled 2023-08-07: qty 2

## 2023-08-07 MED ORDER — NITROGLYCERIN 0.4 MG SL SUBL: 0.4000 mg | SUBLINGUAL_TABLET | SUBLINGUAL | Status: DC | PRN

## 2023-08-07 MED ORDER — AMIODARONE HCL 200 MG PO TABS
200.0000 mg | ORAL_TABLET | Freq: Every day | ORAL | Status: DC
  Administered 2023-08-07 – 2023-08-11 (×5): 200 mg via ORAL
  Filled 2023-08-07 (×5): qty 1

## 2023-08-07 MED ORDER — ORAL CARE MOUTH RINSE: 15.0000 mL | OROMUCOSAL | Status: DC | PRN

## 2023-08-07 MED ORDER — ACETAMINOPHEN 650 MG RE SUPP: 650.0000 mg | Freq: Four times a day (QID) | RECTAL | Status: DC | PRN

## 2023-08-07 MED ORDER — TRAZODONE HCL 50 MG PO TABS: 25.0000 mg | ORAL_TABLET | Freq: Every evening | ORAL | Status: DC | PRN

## 2023-08-07 MED ORDER — PANTOPRAZOLE SODIUM 20 MG PO TBEC
20.0000 mg | DELAYED_RELEASE_TABLET | Freq: Every day | ORAL | Status: DC
  Administered 2023-08-07 – 2023-08-11 (×5): 20 mg via ORAL
  Filled 2023-08-07 (×5): qty 1

## 2023-08-07 MED ORDER — SODIUM CHLORIDE 0.9 % IV SOLN: 1.0000 g | Freq: Once | INTRAVENOUS | Status: DC

## 2023-08-07 MED ORDER — ONDANSETRON HCL 4 MG PO TABS: 4.0000 mg | ORAL_TABLET | Freq: Four times a day (QID) | ORAL | Status: DC | PRN

## 2023-08-07 MED ORDER — MEROPENEM 1 G IV SOLR
1.0000 g | Freq: Three times a day (TID) | INTRAVENOUS | Status: DC
  Administered 2023-08-07 – 2023-08-11 (×13): 1 g via INTRAVENOUS
  Filled 2023-08-07 (×14): qty 20

## 2023-08-07 MED ORDER — LACTATED RINGERS IV SOLN
150.0000 mL/h | INTRAVENOUS | Status: DC
  Administered 2023-08-07: 150 mL/h via INTRAVENOUS

## 2023-08-07 MED ORDER — ATORVASTATIN CALCIUM 20 MG PO TABS
40.0000 mg | ORAL_TABLET | Freq: Every day | ORAL | Status: DC
  Administered 2023-08-07 – 2023-08-11 (×5): 40 mg via ORAL
  Filled 2023-08-07 (×5): qty 2

## 2023-08-07 MED ORDER — RIVAROXABAN 20 MG PO TABS
20.0000 mg | ORAL_TABLET | Freq: Every day | ORAL | Status: DC
Start: 2023-08-07 — End: 2023-08-11
  Administered 2023-08-07 – 2023-08-10 (×4): 20 mg via ORAL
  Filled 2023-08-07 (×5): qty 1

## 2023-08-07 MED ORDER — LACTATED RINGERS IV SOLN: INTRAVENOUS | Status: AC

## 2023-08-07 MED ORDER — ASPIRIN 81 MG PO CHEW
81.0000 mg | CHEWABLE_TABLET | Freq: Every day | ORAL | Status: DC
  Administered 2023-08-07 – 2023-08-11 (×5): 81 mg via ORAL
  Filled 2023-08-07 (×5): qty 1

## 2023-08-07 MED ORDER — SACUBITRIL-VALSARTAN 24-26 MG PO TABS
1.0000 | ORAL_TABLET | Freq: Two times a day (BID) | ORAL | Status: DC
  Administered 2023-08-07 (×2): 1 via ORAL
  Filled 2023-08-07 (×3): qty 1

## 2023-08-07 MED ORDER — CARVEDILOL 6.25 MG PO TABS
6.2500 mg | ORAL_TABLET | Freq: Two times a day (BID) | ORAL | Status: DC
  Administered 2023-08-07 (×2): 6.25 mg via ORAL
  Filled 2023-08-07 (×2): qty 1

## 2023-08-07 MED ORDER — TRAMADOL HCL 50 MG PO TABS: 50.0000 mg | ORAL_TABLET | Freq: Every day | ORAL | Status: DC | PRN

## 2023-08-07 MED ORDER — ONDANSETRON HCL 4 MG/2ML IJ SOLN: 4.0000 mg | Freq: Four times a day (QID) | INTRAMUSCULAR | Status: DC | PRN

## 2023-08-07 MED ORDER — MAGNESIUM HYDROXIDE 400 MG/5ML PO SUSP: 30.0000 mL | Freq: Every day | ORAL | Status: DC | PRN

## 2023-08-07 NOTE — Plan of Care (Signed)
  Problem: Respiratory: Goal: Ability to maintain adequate ventilation will improve Outcome: Progressing   Problem: Clinical Measurements: Goal: Ability to maintain clinical measurements within normal limits will improve Outcome: Progressing Goal: Will remain free from infection Outcome: Progressing Goal: Diagnostic test results will improve Outcome: Progressing Goal: Respiratory complications will improve Outcome: Progressing Goal: Cardiovascular complication will be avoided Outcome: Progressing   Problem: Coping: Goal: Level of anxiety will decrease Outcome: Progressing   Problem: Pain Managment: Goal: General experience of comfort will improve and/or be controlled Outcome: Progressing   Problem: Safety: Goal: Ability to remain free from injury will improve Outcome: Progressing   Problem: Skin Integrity: Goal: Risk for impaired skin integrity will decrease Outcome: Progressing

## 2023-08-07 NOTE — Assessment & Plan Note (Signed)
-   We will continue amiodarone and Xarelto.

## 2023-08-07 NOTE — H&P (Addendum)
 [place]   PATIENT NAME: Jeremy [name]    MR#:  [id number]  DATE OF BIRTH:  June 01, 1956  DATE OF ADMISSION:  08/06/2023  PRIMARY CARE PHYSICIAN: Barbette Reichmann, MD   Patient is coming from: Home  REQUESTING/REFERRING PHYSICIAN: Trinna Post, MD  CHIEF COMPLAINT:   Chief Complaint  Patient presents with   Palpitations   Homeless    HISTORY OF PRESENT ILLNESS:  Argil Mahl is a 68 y.o. African-American male with medical history significant for CHF, coronary artery disease, s/p CABG, type 2 diabetes mellitus and hypertension who presented to the emergency room with acute onset of altered mental status with confusion and somnolence.   He denied any chest pain but admitted to palpitations. He presented with similar symptoms from his correctional facility on 2/16 and was admitted for septic shock due to UTI with ESBL E. coli at which time his blood cultures also grew ESBL E. coli.  The patient unfortunately left AMA on 2/19 and comes today with recurrent symptoms.  He denied any current dysuria, oliguria or hematuria or urgency or frequency or flank pain.  No cough or wheezing or dyspnea.  No nausea or vomiting or abdominal pain.  He was fairly somnolent but arousable.  ED Course: When he came to the ER, BP was 135/98 with heart rate of 108 respiratory rate of 22 and later heart rate was up to 126 with otherwise normal vital signs.  Labs revealed a potassium of 5.4 and CO2 of 18 and a BUN of 38 with a creatinine 1.47 compared to 60 and 2.01 on 2/20.  High-sensitivity troponin was 19 and later 21.  Lactic acid was 2.6 and later 2.2.  CBC showed leukocytosis of 13.5 and anemia close to previous levels.  UA still showed 11-20 WBCs with 50 glucose.  Blood and urine cultures were sent. EKG as reviewed by me : EKG showed sinus tachycardia with rate 126 with possible left atrial enlargement and T wave inversion inferolaterally.  Imaging: Two-view chest x-ray showed no acute  cardiopulmonary disease.  The patient was given IV meropenem and 1 L bolus of IV normal saline.  The patient will be admitted to an observation medical telemetry bed for further evaluation and management. PAST MEDICAL HISTORY:   Past Medical History:  Diagnosis Date   CHF (congestive heart failure) (HCC)    Coronary artery disease    Diabetes mellitus without complication (HCC)    Hypertension    S/P CABG x 1     PAST SURGICAL HISTORY:   Past Surgical History:  Procedure Laterality Date   ANKLE ARTHROSCOPY W/ OPEN REPAIR Right    CORONARY ARTERY BYPASS GRAFT     PULMONARY THROMBECTOMY Bilateral 12/24/2020   Procedure: PULMONARY THROMBECTOMY;  Surgeon: Annice Needy, MD;  Location: [facility] INVASIVE CV [facility];  Service: Cardiovascular;  Laterality: Bilateral;    SOCIAL HISTORY:   Social History   Tobacco Use   Smoking status: Every Day    Current packs/day: 0.50    Types: Cigarettes   Smokeless tobacco: Never  Substance Use Topics   Alcohol use: Yes    Alcohol/week: 3.0 standard drinks of alcohol    Types: 3 Glasses of wine per week    FAMILY HISTORY:   Family History  Problem Relation Age of Onset   Heart attack Father     DRUG ALLERGIES:  No Known Allergies  REVIEW OF SYSTEMS:   ROS As per history of present illness. All pertinent systems  were reviewed above. Constitutional, HEENT, cardiovascular, respiratory, GI, GU, musculoskeletal, neuro, psychiatric, endocrine, integumentary and hematologic systems were reviewed and are otherwise negative/unremarkable except for positive findings mentioned above in the HPI.   MEDICATIONS AT HOME:   Prior to Admission medications   Medication Sig Start Date End Date Taking? Authorizing Provider  acetaminophen (TYLENOL) 500 MG tablet Take 2 tablets (1,000 mg total) by mouth every 8 (eight) hours as needed for moderate pain. 12/11/22 12/11/23 Yes [name], MD  amiodarone (PACERONE) 200 MG tablet Take 1 tablet (200 mg  total) by mouth daily. 06/28/23  Yes Sunnie Nielsen, DO  aspirin 81 MG chewable tablet Chew 1 tablet (81 mg total) by mouth daily. 06/28/23  Yes Sunnie Nielsen, DO  atorvastatin (LIPITOR) 40 MG tablet Take 1 tablet (40 mg total) by mouth daily. 06/28/23  Yes Sunnie Nielsen, DO  carvedilol (COREG) 6.25 MG tablet Take 1 tablet (6.25 mg total) by mouth 2 (two) times daily with meals. 06/28/23  Yes Sunnie Nielsen, DO  metFORMIN (GLUCOPHAGE) 1000 MG tablet Take 1 tablet (1,000 mg total) by mouth once daily with breakfast. 06/28/23  Yes Sunnie Nielsen, DO  nitroGLYCERIN (NITROSTAT) 0.4 MG SL tablet Place 1 tablet (0.4 mg total) under the tongue every 5 (five) minutes as needed for chest pain. 11/26/22  Yes Sunnie Nielsen, DO  pantoprazole (PROTONIX) 20 MG tablet Take 1 tablet (20 mg total) by mouth daily. 06/28/23  Yes Sunnie Nielsen, DO  rivaroxaban (XARELTO) 20 MG TABS tablet Take 1 tablet (20 mg total) by mouth daily at 5 pm 07/20/23 08/19/23 Yes Alexander, Dorene Grebe, DO  sacubitril-valsartan (ENTRESTO) 24-26 MG Take 1 tablet by mouth every 12 (twelve) hours. 06/28/23  Yes Sunnie Nielsen, DO  torsemide (DEMADEX) 20 MG tablet Take 1 tablet (20 mg total) by mouth daily. 06/28/23  Yes Sunnie Nielsen, DO  traMADol (ULTRAM) 50 MG tablet Take 1 tablet (50 mg total) by mouth daily as needed for pain. 06/03/23  Yes       VITAL SIGNS:  Blood pressure 116/69, pulse (!) 101, temperature 98 F (36.7 C), temperature source Oral, resp. rate 18, height 5\' 11"  (1.803 m), SpO2 98%.  PHYSICAL EXAMINATION:  Physical Exam  GENERAL:  68 y.o.-year-old African-American male patient lying in the bed with no acute distress.  Patient was somnolent but arousable. EYES: Pupils equal, round, reactive to light and accommodation. No scleral icterus. Extraocular muscles intact.  HEENT: Head atraumatic, normocephalic. Oropharynx and nasopharynx clear.  NECK:  Supple, no jugular venous distention. No thyroid  enlargement, no tenderness.  LUNGS: Normal breath sounds bilaterally, no wheezing, rales,rhonchi or crepitation. No use of accessory muscles of respiration.  CARDIOVASCULAR: Regular rate and rhythm, S1, S2 normal. No murmurs, rubs, or gallops.  ABDOMEN: Soft, nondistended, nontender. Bowel sounds present. No organomegaly or mass.  EXTREMITIES: No pedal edema, cyanosis, or clubbing.  NEUROLOGIC: Cranial nerves II through XII are intact. Muscle strength 5/5 in all extremities. Sensation intact. Gait not checked.  PSYCHIATRIC: The patient is alert and oriented x 2 to place and person but not to time.  No good eye contact. SKIN: No obvious rash, lesion, or ulcer.   LABORATORY PANEL:   CBC Recent Labs  Lab 08/06/23 1941  WBC 13.5*  HGB 11.2*  HCT 34.6*  PLT 610*   ------------------------------------------------------------------------------------------------------------------  Chemistries  Recent Labs  Lab 08/04/23 0421 08/06/23 1941  NA 138 137  K 4.4 5.4*  CL 106 108  CO2 22 18*  GLUCOSE 270* 125*  BUN 60*  38*  CREATININE 2.01* 1.47*  CALCIUM 8.9 9.1  MG 2.6*  --   AST  --  20  ALT  --  20  ALKPHOS  --  66  BILITOT  --  0.7   ------------------------------------------------------------------------------------------------------------------  Cardiac Enzymes No results for input(s): "TROPONINI" in the last 168 hours. ------------------------------------------------------------------------------------------------------------------  RADIOLOGY:  DG Chest 2 View Result Date: 08/06/2023 CLINICAL DATA:  Chest pain EXAM: CHEST - 2 VIEW COMPARISON:  07/31/2023 FINDINGS: 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 No focal airspace consolidation, pleural effusion, or pneumothorax. IMPRESSION: No active cardiopulmonary disease. Electronically Signed   By: Duanne Guess D.O.   On: 08/06/2023 20:06      IMPRESSION AND PLAN:  Assessment and Plan: * Sepsis due to  gram-negative UTI [facility]) - The patient was admitted to a medical telemetry bed. - We will continue antibiotic therapy with IV meropenem for recently grown ESBL. - We will follow blood and urine cultures. - We will continue hydration with IV lactated ringer.  Acute metabolic encephalopathy - This is like secondary to #1. - Management as above. - We will follow mental status and neurochecks.  Type 2 diabetes mellitus with chronic kidney disease, with long-term current use of insulin (HCC) - We will see the patient on supplemental coverage with NovoLog. - We will hold off metformin.  Coronary artery disease - We will continue statin therapy as well as aspirin and beta-blocker therapy.  GERD without esophagitis - We will continue PPI therapy.  Paroxysmal atrial fibrillation (HCC) - We will continue amiodarone and Xarelto.  Dyslipidemia - We will continue statin therapy.  Essential hypertension - We will continue antihypertensive therapy.   DVT prophylaxis: Lovenox.  Advanced Care Planning:  Code Status: full code.  Family Communication:  The plan of care was discussed in details with the patient (and family). I answered all questions. The patient agreed to proceed with the above mentioned plan. Further management will depend upon hospital course. Disposition Plan: Back to previous home environment Consults called: none.  All the records are reviewed and case discussed with ED provider.  Status is: Observation  I certify that at the time of admission, it is my clinical judgment that the patient will require hospital care extending less than 2 midnights.                            Dispo: The patient is from: Home              Anticipated d/c is to: Home              Patient currently is not medically stable to d/c.              Difficult to place patient: No  Hannah Beat M.D on 08/07/2023 at 2:20 AM  [facility]   From 7 PM-7 AM, contact  night-coverage [email or web address]  CC: Primary care physician; Barbette Reichmann, MD

## 2023-08-07 NOTE — Assessment & Plan Note (Signed)
-   We will continue statin therapy as well as aspirin and beta-blocker therapy.

## 2023-08-07 NOTE — Assessment & Plan Note (Signed)
-   We will see the patient on supplemental coverage with NovoLog. - We will hold off metformin.

## 2023-08-07 NOTE — Progress Notes (Addendum)
 PROGRESS NOTE    Jeremy [name]  EAV:[id number] DOB: [birth year] DOA: 08/06/2023 PCP: Barbette Reichmann, MD   Assessment & Plan:   Principal Problem:   Sepsis due to gram-negative UTI [name]) Active Problems:   Acute metabolic encephalopathy   Essential hypertension   Dyslipidemia   Paroxysmal atrial fibrillation (HCC)   GERD without esophagitis   Coronary artery disease   Type 2 diabetes mellitus with chronic kidney disease, with long-term current use of insulin (HCC)  Assessment and Plan: Sepsis: likely secondary to UTI. See Dr. Chrys Racer note on how he met criteria. Continue on IV merrem w/ recent ESBL. Continue on IVFs. Pt left AMA on 08/04/23  UTI: urine cx is pending. Continue on IV merrem    Acute metabolic encephalopathy: likely secondary to above. Oriented to person and place only. Continue on IV abxs. Re-orient prn    DM2: well controlled, HbA1c 6.4. Continue on SSI w/ accuchecks    Hx of CAD: continue on statin, aspirin. Holding entresto, coreg, torsemide in setting of sepsis    GERD: continue on PPI    PAF: continue on amio, xarelto. Holding coreg in setting on sepsis   HLD: continue on statin    HTN: holding coreg, entresto, torsemide in setting of sepsis   Obesity: BMI 30.5. would benefit from weight loss     DVT prophylaxis: xarelto  Code Status: full  Family Communication:  Disposition Plan: depends on PT/OT recs   Level of care: Telemetry Medical  Status is: Inpatient Remains inpatient appropriate because: severity of illness    Consultants:    Procedures:   Antimicrobials: merrem  Subjective: Pt c/o fatigue.   Objective: Vitals:   08/07/23 0115 08/07/23 0500 08/07/23 0757 08/07/23 0758  BP: 116/69  92/62 90/68  Pulse: (!) 101  (!) 50 (!) 103  Resp: 18  18   Temp: 98 F (36.7 C)  98.2 F (36.8 C)   TempSrc: Oral     SpO2: 98%  97%   Weight:  99.3 kg    Height: 5\' 11"  (1.803 m)       Intake/Output Summary (Last 24 hours) at  08/07/2023 7829 Last data filed at 08/07/2023 0600 Gross per 24 hour  Intake 1700.52 ml  Output --  Net 1700.52 ml   Filed Weights   08/07/23 0500  Weight: 99.3 kg    Examination:  General exam: Appears calm and comfortable  Respiratory system: Clear to auscultation. Respiratory effort normal. Cardiovascular system: S1 & S2+. No rubs, gallops or clicks.  Gastrointestinal system: Abdomen is obese, soft and nontender.  Normal bowel sounds heard. Central nervous system: Alert and oriented x 2 only.  Psychiatry: Judgement and insight appears poor. Flat mood and affect    Data Reviewed: I have personally reviewed following labs and imaging studies  CBC: Recent Labs  Lab 07/31/23 1124 08/01/23 0425 08/03/23 0444 08/04/23 0421 08/06/23 1941 08/07/23 0647  WBC 15.8* 17.4* 13.6* 18.0* 13.5* 10.6*  NEUTROABS 13.4*  --   --   --   --   --   HGB 12.7* 11.9* 11.6* 11.4* 11.2* 10.9*  HCT 37.5* 34.2* 34.8* 33.4* 34.6* 33.6*  MCV 89.7 89.8 91.6 91.5 96.4 96.3  PLT 453* 533* 675* 757* 610* 541*   Basic Metabolic Panel: Recent Labs  Lab 08/01/23 0425 08/02/23 0628 08/03/23 0444 08/03/23 1557 08/04/23 0421 08/06/23 1941 08/07/23 0647  NA 134*  133* 136 134*  --  138 137 140  K 5.0  4.9 5.0 5.2* 4.9  4.4 5.4* 4.8  CL 104  104 110 108  --  106 108 112*  CO2 16*  17* 18* 17*  --  22 18* 19*  GLUCOSE 226*  231* 160* 196*  --  270* 125* 166*  BUN 93*  93* 88* 74*  --  60* 38* 31*  CREATININE 3.09*  2.98* 2.65* 2.22*  --  2.01* 1.47* 1.44*  CALCIUM 8.6*  8.6* 8.5* 8.7*  --  8.9 9.1 8.7*  MG 2.5* 2.6* 2.7* 2.7* 2.6*  --   --   PHOS 3.9 3.8 4.1  --  3.1  --   --    GFR: Estimated Creatinine Clearance: 60.6 mL/min (A) (by C-G formula based on SCr of 1.44 mg/dL (H)). Liver Function Tests: Recent Labs  Lab 07/31/23 1124 08/03/23 0444 08/04/23 0421 08/06/23 1941  AST 19  --   --  20  ALT 20  --   --  20  ALKPHOS 72  --   --  66  BILITOT 1.5*  --   --  0.7  PROT 7.8   --   --  7.2  ALBUMIN 2.9* 2.2* 2.4* 3.0*   No results for input(s): "LIPASE", "AMYLASE" in the last 168 hours. No results for input(s): "AMMONIA" in the last 168 hours. Coagulation Profile: Recent Labs  Lab 07/31/23 1124 08/07/23 0647  INR 2.4* 1.2   Cardiac Enzymes: No results for input(s): "CKTOTAL", "CKMB", "CKMBINDEX", "TROPONINI" in the last 168 hours. BNP (last 3 results) No results for input(s): "PROBNP" in the last 8760 hours. HbA1C: No results for input(s): "HGBA1C" in the last 72 hours. CBG: Recent Labs  Lab 08/03/23 1932 08/03/23 2331 08/04/23 0259 08/04/23 0803 08/04/23 1129  GLUCAP 240* 223* 209* 241* 228*   Lipid Profile: No results for input(s): "CHOL", "HDL", "LDLCALC", "TRIG", "CHOLHDL", "LDLDIRECT" in the last 72 hours. Thyroid Function Tests: No results for input(s): "TSH", "T4TOTAL", "FREET4", "T3FREE", "THYROIDAB" in the last 72 hours. Anemia Panel: No results for input(s): "VITAMINB12", "FOLATE", "FERRITIN", "TIBC", "IRON", "RETICCTPCT" in the last 72 hours. Sepsis Labs: Recent Labs  Lab 07/31/23 1124 07/31/23 1407 08/01/23 0425 08/06/23 2048 08/07/23 0005  PROCALCITON  --   --  2.77  --   --   LATICACIDVEN 1.6 1.9  --  2.6* 2.2*    Recent Results (from the past 240 hours)  Culture, blood (Routine x 2)     Status: Abnormal   Collection Time: 07/31/23 11:29 AM   Specimen: BLOOD  Result Value Ref Range Status   Specimen Description   Final    BLOOD RIGHT ANTECUBITAL Performed at [facility], [address] [place], [place] [address]    Special Requests   Final    BOTTLES DRAWN AEROBIC AND ANAEROBIC Blood Culture results may not be optimal due to an inadequate volume of blood received in culture bottles Performed at [facility], [address] [place], [place] [address]    Culture  Setup Time   Final    GRAM NEGATIVE RODS ANAEROBIC BOTTLE ONLY Organism ID to follow CRITICAL RESULT CALLED TO, READ BACK BY AND  VERIFIED WITH: WALID NAZARI @ 0042 08/01/23 LFD    Culture (A)  Final    ESCHERICHIA COLI Confirmed Extended Spectrum Beta-Lactamase Producer (ESBL).  In bloodstream infections from ESBL organisms, carbapenems are preferred over piperacillin/tazobactam. They are shown to have a lower risk of mortality.    Report Status 08/03/2023 FINAL  Final   Organism ID, Bacteria ESCHERICHIA COLI  Final      Susceptibility   Escherichia coli - MIC*    AMPICILLIN >=32 RESISTANT Resistant     CEFEPIME >=32 RESISTANT Resistant     CEFTAZIDIME RESISTANT Resistant     CEFTRIAXONE >=64 RESISTANT Resistant     CIPROFLOXACIN >=4 RESISTANT Resistant     GENTAMICIN <=1 SENSITIVE Sensitive     IMIPENEM <=0.25 SENSITIVE Sensitive     TRIMETH/SULFA <=20 SENSITIVE Sensitive     AMPICILLIN/SULBACTAM >=32 RESISTANT Resistant     PIP/TAZO 8 SENSITIVE Sensitive ug/mL    * ESCHERICHIA COLI  Blood Culture ID Panel (Reflexed)     Status: Abnormal   Collection Time: 07/31/23 11:29 AM  Result Value Ref Range Status   Enterococcus faecalis NOT DETECTED NOT DETECTED Final   Enterococcus Faecium NOT DETECTED NOT DETECTED Final   Listeria monocytogenes NOT DETECTED NOT DETECTED Final   Staphylococcus species NOT DETECTED NOT DETECTED Final   Staphylococcus aureus (BCID) NOT DETECTED NOT DETECTED Final   Staphylococcus epidermidis NOT DETECTED NOT DETECTED Final   Staphylococcus lugdunensis NOT DETECTED NOT DETECTED Final   Streptococcus species NOT DETECTED NOT DETECTED Final   Streptococcus agalactiae NOT DETECTED NOT DETECTED Final   Streptococcus pneumoniae NOT DETECTED NOT DETECTED Final   Streptococcus pyogenes NOT DETECTED NOT DETECTED Final   A.calcoaceticus-baumannii NOT DETECTED NOT DETECTED Final   Bacteroides fragilis NOT DETECTED NOT DETECTED Final   Enterobacterales DETECTED (A) NOT DETECTED Final    Comment: Enterobacterales represent a large order of gram negative bacteria, not a single  organism. CRITICAL RESULT CALLED TO, READ BACK BY AND VERIFIED WITH: WALID NAZARI @ 1610 08/01/23 LFD    Enterobacter cloacae complex NOT DETECTED NOT DETECTED Final   Escherichia coli DETECTED (A) NOT DETECTED Final    Comment: CRITICAL RESULT CALLED TO, READ BACK BY AND VERIFIED WITH: WALID NAZARI @ 0042 08/01/23 LFD    Klebsiella aerogenes NOT DETECTED NOT DETECTED Final   Klebsiella oxytoca NOT DETECTED NOT DETECTED Final   Klebsiella pneumoniae NOT DETECTED NOT DETECTED Final   Proteus species NOT DETECTED NOT DETECTED Final   Salmonella species NOT DETECTED NOT DETECTED Final   Serratia marcescens NOT DETECTED NOT DETECTED Final   Haemophilus influenzae NOT DETECTED NOT DETECTED Final   Neisseria meningitidis NOT DETECTED NOT DETECTED Final   Pseudomonas aeruginosa NOT DETECTED NOT DETECTED Final   Stenotrophomonas maltophilia NOT DETECTED NOT DETECTED Final   Candida albicans NOT DETECTED NOT DETECTED Final   Candida auris NOT DETECTED NOT DETECTED Final   Candida glabrata NOT DETECTED NOT DETECTED Final   Candida krusei NOT DETECTED NOT DETECTED Final   Candida parapsilosis NOT DETECTED NOT DETECTED Final   Candida tropicalis NOT DETECTED NOT DETECTED Final   Cryptococcus neoformans/gattii NOT DETECTED NOT DETECTED Final   CTX-M ESBL DETECTED (A) NOT DETECTED Final    Comment: CRITICAL RESULT CALLED TO, READ BACK BY AND VERIFIED WITH: WALID NAZARI @0042  08/01/23 LFD (NOTE) Extended spectrum beta-lactamase detected. Recommend a carbapenem as initial therapy.      Carbapenem resistance IMP NOT DETECTED NOT DETECTED Final   Carbapenem resistance KPC NOT DETECTED NOT DETECTED Final   Carbapenem resistance NDM NOT DETECTED NOT DETECTED Final   Carbapenem resist OXA 48 LIKE NOT DETECTED NOT DETECTED Final   Carbapenem resistance VIM NOT DETECTED NOT DETECTED Final    Comment: Performed at [facility], [address] [place], [place] [address]  Culture, blood  (Routine x 2)     Status: None  Collection Time: 07/31/23 11:36 AM   Specimen: BLOOD  Result Value Ref Range Status   Specimen Description BLOOD LEFT ANTECUBITAL  Final   Special Requests   Final    BOTTLES DRAWN AEROBIC AND ANAEROBIC Blood Culture results may not be optimal due to an inadequate volume of blood received in culture bottles   Culture   Final    NO GROWTH 5 DAYS Performed at [facility], [address] [place], [place] [address]    Report Status 08/05/2023 FINAL  Final  Resp panel by RT-PCR (RSV, Flu A&B, Covid) Anterior Nasal Swab     Status: None   Collection Time: 07/31/23 12:50 PM   Specimen: Anterior Nasal Swab  Result Value Ref Range Status   SARS Coronavirus 2 by RT PCR NEGATIVE NEGATIVE Final    Comment: (NOTE) SARS-CoV-2 target nucleic acids are NOT DETECTED.  The SARS-CoV-2 RNA is generally detectable in upper respiratory specimens during the acute phase of infection. The lowest concentration of SARS-CoV-2 viral copies this assay can detect is 138 copies/mL. A negative result does not preclude SARS-Cov-2 infection and should not be used as the sole basis for treatment or other patient management decisions. A negative result may occur with  improper specimen collection/handling, submission of specimen other than nasopharyngeal swab, presence of viral mutation(s) within the areas targeted by this assay, and inadequate number of viral copies(<138 copies/mL). A negative result must be combined with clinical observations, patient history, and epidemiological information. The expected result is Negative.  Fact Sheet for Patients:  BloggerCourse.com  Fact Sheet for Healthcare Providers:  SeriousBroker.it  This test is no t yet approved or cleared by the [place] FDA and  has been authorized for detection and/or diagnosis of SARS-CoV-2 by FDA under an Emergency Use Authorization (EUA). This EUA  will remain  in effect (meaning this test can be used) for the duration of the COVID-19 declaration under Section 564(b)(1) of the Act, 21 U.S.C.section 360bbb-3(b)(1), unless the authorization is terminated  or revoked sooner.       Influenza A by PCR NEGATIVE NEGATIVE Final   Influenza B by PCR NEGATIVE NEGATIVE Final    Comment: (NOTE) The Xpert Xpress SARS-CoV-2/FLU/RSV plus assay is intended as an aid in the diagnosis of influenza from Nasopharyngeal swab specimens and should not be used as a sole basis for treatment. Nasal washings and aspirates are unacceptable for Xpert Xpress SARS-CoV-2/FLU/RSV testing.  Fact Sheet for Patients: BloggerCourse.com  Fact Sheet for Healthcare Providers: SeriousBroker.it  This test is not yet approved or cleared by the [place] FDA and has been authorized for detection and/or diagnosis of SARS-CoV-2 by FDA under an Emergency Use Authorization (EUA). This EUA will remain in effect (meaning this test can be used) for the duration of the COVID-19 declaration under Section 564(b)(1) of the Act, 21 U.S.C. section 360bbb-3(b)(1), unless the authorization is terminated or revoked.     Resp Syncytial Virus by PCR NEGATIVE NEGATIVE Final    Comment: (NOTE) Fact Sheet for Patients: BloggerCourse.com  Fact Sheet for Healthcare Providers: SeriousBroker.it  This test is not yet approved or cleared by the [place] FDA and has been authorized for detection and/or diagnosis of SARS-CoV-2 by FDA under an Emergency Use Authorization (EUA). This EUA will remain in effect (meaning this test can be used) for the duration of the COVID-19 declaration under Section 564(b)(1) of the Act, 21 U.S.C. section 360bbb-3(b)(1), unless the authorization is terminated or revoked.  Performed at [facility], [address], [place], [place]  [address]   Urine Culture     Status: Abnormal   Collection Time: 07/31/23  3:53 PM   Specimen: Urine, Random  Result Value Ref Range Status   Specimen Description   Final    URINE, RANDOM Performed at [facility], [address] [place], [place] [address]    Special Requests   Final    NONE Reflexed from [id number] Performed at [facility], [address] [place], [place] [address]    Culture >=100,000 COLONIES/mL ESCHERICHIA COLI (A)  Final   Report Status 08/02/2023 FINAL  Final   Organism ID, Bacteria ESCHERICHIA COLI (A)  Final      Susceptibility   Escherichia coli - MIC*    AMPICILLIN >=32 RESISTANT Resistant     CEFAZOLIN >=64 RESISTANT Resistant     CEFEPIME 16 RESISTANT Resistant     CEFTRIAXONE >=64 RESISTANT Resistant     CIPROFLOXACIN >=4 RESISTANT Resistant     GENTAMICIN <=1 SENSITIVE Sensitive     IMIPENEM <=0.25 SENSITIVE Sensitive     NITROFURANTOIN 128 RESISTANT Resistant     TRIMETH/SULFA <=20 SENSITIVE Sensitive     AMPICILLIN/SULBACTAM >=32 RESISTANT Resistant     PIP/TAZO 8 SENSITIVE Sensitive ug/mL    * >=100,000 COLONIES/mL ESCHERICHIA COLI  MRSA Next Gen by PCR, Nasal     Status: None   Collection Time: 07/31/23  5:06 PM  Result Value Ref Range Status   MRSA by PCR Next Gen NOT DETECTED NOT DETECTED Final    Comment: (NOTE) The GeneXpert MRSA Assay (FDA approved for NASAL specimens only), is one component of a comprehensive MRSA colonization surveillance program. It is not intended to diagnose MRSA infection nor to guide or monitor treatment for MRSA infections. Test performance is not FDA approved in patients less than 16 years old. Performed at [facility], [address] [place], [place] [address]   Respiratory (~20 pathogens) panel by PCR     Status: None   Collection Time: 08/01/23  8:11 AM  Result Value Ref Range Status   Adenovirus NOT DETECTED NOT DETECTED Final   Coronavirus 229E NOT DETECTED NOT DETECTED  Final    Comment: (NOTE) The Coronavirus on the Respiratory Panel, DOES NOT test for the novel  Coronavirus (2019 nCoV)    Coronavirus HKU1 NOT DETECTED NOT DETECTED Final   Coronavirus NL63 NOT DETECTED NOT DETECTED Final   Coronavirus OC43 NOT DETECTED NOT DETECTED Final   Metapneumovirus NOT DETECTED NOT DETECTED Final   Rhinovirus / Enterovirus NOT DETECTED NOT DETECTED Final   Influenza A NOT DETECTED NOT DETECTED Final   Influenza B NOT DETECTED NOT DETECTED Final   Parainfluenza Virus 1 NOT DETECTED NOT DETECTED Final   Parainfluenza Virus 2 NOT DETECTED NOT DETECTED Final   Parainfluenza Virus 3 NOT DETECTED NOT DETECTED Final   Parainfluenza Virus 4 NOT DETECTED NOT DETECTED Final   Respiratory Syncytial Virus NOT DETECTED NOT DETECTED Final   Bordetella pertussis NOT DETECTED NOT DETECTED Final   Bordetella Parapertussis NOT DETECTED NOT DETECTED Final   Chlamydophila pneumoniae NOT DETECTED NOT DETECTED Final   Mycoplasma pneumoniae NOT DETECTED NOT DETECTED Final    Comment: Performed at [facility], [address] [place], [place] [address]  Resp panel by RT-PCR (RSV, Flu A&B, Covid) Anterior Nasal Swab     Status: None   Collection Time: 08/06/23  8:48 PM   Specimen: Anterior Nasal Swab  Result  Value Ref Range Status   SARS Coronavirus 2 by RT PCR NEGATIVE NEGATIVE Final    Comment: (NOTE) SARS-CoV-2 target nucleic acids are NOT DETECTED.  The SARS-CoV-2 RNA is generally detectable in upper respiratory specimens during the acute phase of infection. The lowest concentration of SARS-CoV-2 viral copies this assay can detect is 138 copies/mL. A negative result does not preclude SARS-Cov-2 infection and should not be used as the sole basis for treatment or other patient management decisions. A negative result may occur with  improper specimen collection/handling, submission of specimen other than nasopharyngeal swab, presence of viral mutation(s) within  the areas targeted by this assay, and inadequate number of viral copies(<138 copies/mL). A negative result must be combined with clinical observations, patient history, and epidemiological information. The expected result is Negative.  Fact Sheet for Patients:  BloggerCourse.com  Fact Sheet for Healthcare Providers:  SeriousBroker.it  This test is no t yet approved or cleared by the [place] FDA and  has been authorized for detection and/or diagnosis of SARS-CoV-2 by FDA under an Emergency Use Authorization (EUA). This EUA will remain  in effect (meaning this test can be used) for the duration of the COVID-19 declaration under Section 564(b)(1) of the Act, 21 U.S.C.section 360bbb-3(b)(1), unless the authorization is terminated  or revoked sooner.       Influenza A by PCR NEGATIVE NEGATIVE Final   Influenza B by PCR NEGATIVE NEGATIVE Final    Comment: (NOTE) The Xpert Xpress SARS-CoV-2/FLU/RSV plus assay is intended as an aid in the diagnosis of influenza from Nasopharyngeal swab specimens and should not be used as a sole basis for treatment. Nasal washings and aspirates are unacceptable for Xpert Xpress SARS-CoV-2/FLU/RSV testing.  Fact Sheet for Patients: BloggerCourse.com  Fact Sheet for Healthcare Providers: SeriousBroker.it  This test is not yet approved or cleared by the [place] FDA and has been authorized for detection and/or diagnosis of SARS-CoV-2 by FDA under an Emergency Use Authorization (EUA). This EUA will remain in effect (meaning this test can be used) for the duration of the COVID-19 declaration under Section 564(b)(1) of the Act, 21 U.S.C. section 360bbb-3(b)(1), unless the authorization is terminated or revoked.     Resp Syncytial Virus by PCR NEGATIVE NEGATIVE Final    Comment: (NOTE) Fact Sheet for  Patients: BloggerCourse.com  Fact Sheet for Healthcare Providers: SeriousBroker.it  This test is not yet approved or cleared by the [place] FDA and has been authorized for detection and/or diagnosis of SARS-CoV-2 by FDA under an Emergency Use Authorization (EUA). This EUA will remain in effect (meaning this test can be used) for the duration of the COVID-19 declaration under Section 564(b)(1) of the Act, 21 U.S.C. section 360bbb-3(b)(1), unless the authorization is terminated or revoked.  Performed at [facility], [address] [place], [place] [address]   Blood Culture (routine x 2)     Status: None (Preliminary result)   Collection Time: 08/06/23  8:48 PM   Specimen: BLOOD  Result Value Ref Range Status   Specimen Description BLOOD LFA  Final   Special Requests   Final    BOTTLES DRAWN AEROBIC AND ANAEROBIC Blood Culture results may not be optimal due to an inadequate volume of blood received in culture bottles   Culture   Final    NO GROWTH < 12 HOURS Performed at [facility], [address] [place], [place] [address]    Report Status PENDING  Incomplete  Blood Culture (routine x 2)  Status: None (Preliminary result)   Collection Time: 08/06/23  8:48 PM   Specimen: BLOOD  Result Value Ref Range Status   Specimen Description BLOOD RFA  Final   Special Requests   Final    BOTTLES DRAWN AEROBIC AND ANAEROBIC Blood Culture results may not be optimal due to an inadequate volume of blood received in culture bottles   Culture   Final    NO GROWTH < 12 HOURS Performed at [facility], [address], [place], [place] [address]    Report Status PENDING  Incomplete         Radiology Studies: DG Chest 2 View Result Date: 08/06/2023 CLINICAL DATA:  Chest pain EXAM: CHEST - 2 VIEW COMPARISON:  07/31/2023 FINDINGS: 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 No focal airspace  consolidation, pleural effusion, or pneumothorax. IMPRESSION: No active cardiopulmonary disease. Electronically Signed   By: Duanne Guess D.O.   On: 08/06/2023 20:06        Scheduled Meds:  amiodarone  200 mg Oral Daily   aspirin  81 mg Oral Daily   atorvastatin  40 mg Oral Daily   carvedilol  6.25 mg Oral BID WC   pantoprazole  20 mg Oral Daily   rivaroxaban  20 mg Oral Q supper   sacubitril-valsartan  1 tablet Oral Q12H   torsemide  20 mg Oral Daily   Continuous Infusions:  lactated ringers 150 mL/hr (08/07/23 0139)   meropenem (MERREM) IV 1 g (08/07/23 0528)     LOS: 0 days        Charise Killian, MD [facility] Pager [phone]  If 7PM-7AM, please contact night-coverage 08/07/2023, 8:22 AM

## 2023-08-07 NOTE — Assessment & Plan Note (Signed)
-   This is like secondary to #1. - Management as above. - We will follow mental status and neurochecks.

## 2023-08-07 NOTE — Progress Notes (Signed)
 Pt arrived to unit. Street clothes on. Removed clothes and gown placed. Telemetry placed. Pt drowsy. Follows commands. Will not answer all admission questions.

## 2023-08-07 NOTE — Progress Notes (Signed)
 Pharmacy Antibiotic Note  Jeremy [name] is a 68 y.o. male admitted on 08/06/2023 with UTI and recent hx of ESBL infection (2116/25).  Pharmacy has been consulted for Meropenem dosing.  Plan: Meropenem 1 gm q8hr per indication & renal fxn.  Pharmacy will continue to follow and will adjust abx dosing whenever warranted.  Temp (24hrs), Avg:98.1 F (36.7 C), Min:98.1 F (36.7 C), Max:98.1 F (36.7 C)   Recent Labs  Lab 07/31/23 1124 07/31/23 1407 08/01/23 0425 08/02/23 0628 08/03/23 0444 08/04/23 0421 08/06/23 1941 08/06/23 2048 08/07/23 0005  WBC 15.8*  --  17.4*  --  13.6* 18.0* 13.5*  --   --   CREATININE 3.69*  --  3.09*  2.98* 2.65* 2.22* 2.01* 1.47*  --   --   LATICACIDVEN 1.6 1.9  --   --   --   --   --  2.6* 2.2*    Estimated Creatinine Clearance: 58.5 mL/min (A) (by C-G formula based on SCr of 1.47 mg/dL (H)).    No Known Allergies  Antimicrobials this admission: 2/22 Meropenem >>   Microbiology results: 2/22 BCx: Pending 2/22 UCx: Pending   Thank you for allowing pharmacy to be a part of this patient's care.  Otelia Sergeant, PharmD, MBA 08/07/2023 1:08 AM

## 2023-08-07 NOTE — Progress Notes (Signed)
 PT Cancellation Note  Patient Details Name: Jeremy [name] MRN: [id number] DOB: 1956-04-29   Cancelled Treatment:    Reason Eval/Treat Not Completed: Patient declined, no reason specified.  PT consult received.  Chart reviewed.  Pt initially agreeable to therapy but then pt suddenly refusing therapy until he ate his "grapes" (pt reports ordering them for dinner which had not arrived yet).  Will re-attempt PT evaluation tomorrow.  Hendricks Limes, PT 08/07/23, 4:44 PM

## 2023-08-07 NOTE — Assessment & Plan Note (Signed)
 -  We will continue PPI therapy

## 2023-08-07 NOTE — Assessment & Plan Note (Signed)
-   The patient was admitted to a medical telemetry bed. - We will continue antibiotic therapy with IV meropenem for recently grown ESBL. - We will follow blood and urine cultures. - We will continue hydration with IV lactated ringer.

## 2023-08-07 NOTE — Assessment & Plan Note (Signed)
 -  We will continue statin therapy.

## 2023-08-07 NOTE — Assessment & Plan Note (Signed)
-   We will continue antihypertensive therapy.

## 2023-08-07 NOTE — Plan of Care (Signed)
?  Problem: Fluid Volume: ?Goal: Hemodynamic stability will improve ?Outcome: Progressing ?  ?Problem: Clinical Measurements: ?Goal: Diagnostic test results will improve ?Outcome: Progressing ?Goal: Signs and symptoms of infection will decrease ?Outcome: Progressing ?  ?Problem: Respiratory: ?Goal: Ability to maintain adequate ventilation will improve ?Outcome: Progressing ?  ?Problem: Education: ?Goal: Knowledge of General Education information will improve ?Description: Including pain rating scale, medication(s)/side effects and non-pharmacologic comfort measures ?Outcome: Progressing ?  ?Problem: Health Behavior/Discharge Planning: ?Goal: Ability to manage health-related needs will improve ?Outcome: Progressing ?  ?Problem: Clinical Measurements: ?Goal: Ability to maintain clinical measurements within normal limits will improve ?Outcome: Progressing ?Goal: Will remain free from infection ?Outcome: Progressing ?Goal: Diagnostic test results will improve ?Outcome: Progressing ?Goal: Respiratory complications will improve ?Outcome: Progressing ?Goal: Cardiovascular complication will be avoided ?Outcome: Progressing ?  ?Problem: Activity: ?Goal: Risk for activity intolerance will decrease ?Outcome: Progressing ?  ?Problem: Nutrition: ?Goal: Adequate nutrition will be maintained ?Outcome: Progressing ?  ?Problem: Coping: ?Goal: Level of anxiety will decrease ?Outcome: Progressing ?  ?Problem: Elimination: ?Goal: Will not experience complications related to bowel motility ?Outcome: Progressing ?Goal: Will not experience complications related to urinary retention ?Outcome: Progressing ?  ?Problem: Safety: ?Goal: Ability to remain free from injury will improve ?Outcome: Progressing ?  ?Problem: Skin Integrity: ?Goal: Risk for impaired skin integrity will decrease ?Outcome: Progressing ?  ?

## 2023-08-08 ENCOUNTER — Other Ambulatory Visit: Payer: Self-pay

## 2023-08-08 DIAGNOSIS — I4892 Unspecified atrial flutter: Secondary | ICD-10-CM | POA: Diagnosis not present

## 2023-08-08 LAB — CBC
HCT: 31.7 % — ABNORMAL LOW (ref 39.0–52.0)
Hemoglobin: 10.7 g/dL — ABNORMAL LOW (ref 13.0–17.0)
MCH: 31.8 pg (ref 26.0–34.0)
MCHC: 33.8 g/dL (ref 30.0–36.0)
MCV: 94.3 fL (ref 80.0–100.0)
Platelets: 467 10*3/uL — ABNORMAL HIGH (ref 150–400)
RBC: 3.36 MIL/uL — ABNORMAL LOW (ref 4.22–5.81)
RDW: 14.9 % (ref 11.5–15.5)
WBC: 10 10*3/uL (ref 4.0–10.5)
nRBC: 0 % (ref 0.0–0.2)

## 2023-08-08 LAB — BASIC METABOLIC PANEL
Anion gap: 9 (ref 5–15)
BUN: 31 mg/dL — ABNORMAL HIGH (ref 8–23)
CO2: 22 mmol/L (ref 22–32)
Calcium: 8.2 mg/dL — ABNORMAL LOW (ref 8.9–10.3)
Chloride: 106 mmol/L (ref 98–111)
Creatinine, Ser: 1.58 mg/dL — ABNORMAL HIGH (ref 0.61–1.24)
GFR, Estimated: 48 mL/min — ABNORMAL LOW (ref >60)
Glucose, Bld: 174 mg/dL — ABNORMAL HIGH (ref 70–99)
Potassium: 5.4 mmol/L — ABNORMAL HIGH (ref 3.5–5.1)
Sodium: 137 mmol/L (ref 135–145)

## 2023-08-08 LAB — URINE CULTURE: Culture: <10000 — AB

## 2023-08-08 MED ORDER — SODIUM ZIRCONIUM CYCLOSILICATE 10 G PO PACK
10.0000 g | PACK | Freq: Once | ORAL | Status: AC
  Administered 2023-08-08: 10 g via ORAL
  Filled 2023-08-08: qty 1

## 2023-08-08 MED ORDER — METOPROLOL TARTRATE 5 MG/5ML IV SOLN
5.0000 mg | Freq: Four times a day (QID) | INTRAVENOUS | Status: DC | PRN
  Administered 2023-08-08: 5 mg via INTRAVENOUS
  Filled 2023-08-08: qty 5

## 2023-08-08 MED ORDER — LACTATED RINGERS IV BOLUS
500.0000 mL | Freq: Once | INTRAVENOUS | Status: AC
  Administered 2023-08-08: 500 mL via INTRAVENOUS

## 2023-08-08 NOTE — Evaluation (Signed)
 Occupational Therapy Evaluation Patient Details Name: Jeremy [name] MRN: [id number] DOB: 03/09/1956 Today's Date: 08/08/2023   History of Present Illness   68 y.o. AA male with medical history significant for CHF, CAD, s/p CABG, type 2 diabetes mellitus and HTN, who presented to the ED with acute onset of altered mental status with confusion and somnolence. He denied any chest pain but admitted to palpitations. He presented with similar symptoms from his correctional facility on 2/16 and was admitted for septic shock due to UTI with ESBL E. coli at which time his blood cultures also grew ESBL E. coli.  The patient unfortunately left AMA on 2/19 and comes 2/22 with recurrent symptoms.     Clinical Impressions Pt was seen for OT evaluation this date and cotx with PT to optimize safety. Prior to hospital admission, pt recently incarcerated denies having a home to return to after discharge. Pt presents to acute OT demonstrating impaired ADL performance and functional mobility 2/2 decreased balance, safety awareness, and activity tolerance (See OT problem list for additional functional deficits). Hx R ankle fracture impairing his gait with mobility. +2 for safety/IV pole mgt with mobility in the hall while PT provided CGA with RW use. Pt currently requires supv for safety with seated LB ADL tasks. VC for safety during session; endorsed slightly woozy when he turned too quickly in the hall. Educated in falls prevention. Pt would benefit from skilled OT services to address noted impairments and functional limitations (see below for any additional details) in order to maximize safety and independence while minimizing falls risk and caregiver burden.    If plan is discharge home, recommend the following:   A little help with walking and/or transfers;A little help with bathing/dressing/bathroom;Help with stairs or ramp for entrance     Functional Status Assessment   Patient has had a recent decline in  their functional status and demonstrates the ability to make significant improvements in function in a reasonable and predictable amount of time.     Equipment Recommendations   BSC/3in1;Other (comment) (2ww)     Recommendations for Other Services         Precautions/Restrictions   Precautions Precautions: Fall Recall of Precautions/Restrictions: Intact Restrictions Weight Bearing Restrictions Per Provider Order: No     Mobility Bed Mobility Overal bed mobility: Modified Independent Bed Mobility: Supine to Sit, Sit to Supine                Transfers Overall transfer level: Needs assistance Equipment used: Rolling walker (2 wheels) Transfers: Sit to/from Stand Sit to Stand: Contact guard assist                  Balance Overall balance assessment: Needs assistance Sitting-balance support: Feet supported Sitting balance-Leahy Scale: Good     Standing balance support: Bilateral upper extremity supported Standing balance-Leahy Scale: Fair                             ADL either performed or assessed with clinical judgement   ADL Overall ADL's : Needs assistance/impaired     Grooming: Sitting;Set up Grooming Details (indicate cue type and reason): seated EOB after walk in hall 2/2 fatigue             Lower Body Dressing: Sitting/lateral leans;Contact guard assist Lower Body Dressing Details (indicate cue type and reason): CGA for sitting balance as pt was able to adjust his socks without direct assist EOB  Functional mobility during ADLs: Contact guard assist;Rolling walker (2 wheels);+2 for safety/equipment       Vision         Perception         Praxis         Pertinent Vitals/Pain Pain Assessment Pain Assessment: No/denies pain     Extremity/Trunk Assessment Upper Extremity Assessment Upper Extremity Assessment: Overall WFL for tasks assessed   Lower Extremity Assessment Lower Extremity  Assessment: Defer to PT evaluation       Communication     Cognition Arousal: Alert Behavior During Therapy: Impulsive Cognition: Cognition impaired, No family/caregiver present to determine baseline     Awareness: Intellectual awareness impaired                         Following commands: Intact       Cueing  General Comments   Cueing Techniques: Verbal cues;Visual cues      Exercises Other Exercises Other Exercises: Pt educated in falls prevention and use of standing and seated rest breaks to ensure safety   Shoulder Instructions      Home Living Family/patient expects to be discharged to:: Shelter/Homeless                                 Additional Comments: Pt recently from jail, left hospital AMA and denies having a home available to him      Prior Functioning/Environment Prior Level of Function : Independent/Modified Independent             Mobility Comments: pt reports amb with no AD ADLs Comments: pt reports MOD I-I with ADL/IADL    OT Problem List: Decreased activity tolerance;Decreased strength;Impaired balance (sitting and/or standing);Decreased knowledge of use of DME or AE;Decreased safety awareness   OT Treatment/Interventions: Self-care/ADL training;Therapeutic exercise;Patient/family education;DME and/or AE instruction;Therapeutic activities;Energy conservation;Balance training      OT Goals(Current goals can be found in the care plan section)   Acute Rehab OT Goals Patient Stated Goal: get better and find a home OT Goal Formulation: With patient Time For Goal Achievement: 08/22/23 Potential to Achieve Goals: Good ADL Goals Additional ADL Goal #1: Pt will complete all aspects of toileting and grooming with modified independence, 2/2 opportunities and without SOB. Additional ADL Goal #2: Pt will verbalize plan to implement at least 1 learned ECS to support safer ADL/mobility.   OT Frequency:  Min 1X/week     Co-evaluation PT/OT/SLP Co-Evaluation/Treatment: Yes Reason for Co-Treatment: To address functional/ADL transfers;For patient/therapist safety PT goals addressed during session: Mobility/safety with mobility OT goals addressed during session: ADL's and self-care      AM-PAC OT "6 Clicks" Daily Activity     Outcome Measure Help from another person eating meals?: None Help from another person taking care of personal grooming?: A Little Help from another person toileting, which includes using toliet, bedpan, or urinal?: A Little Help from another person bathing (including washing, rinsing, drying)?: A Little Help from another person to put on and taking off regular upper body clothing?: None Help from another person to put on and taking off regular lower body clothing?: None 6 Click Score: 21   End of Session Equipment Utilized During Treatment: Rolling walker (2 wheels);Gait belt Nurse Communication: Mobility status  Activity Tolerance: Patient tolerated treatment well Patient left: in bed;with call bell/phone within reach;with bed alarm set  OT Visit Diagnosis: Other abnormalities of gait  and mobility (R26.89);Muscle weakness (generalized) (M62.81);Unsteadiness on feet (R26.81)                Time: 7829-5621 OT Time Calculation (min): 22 min Charges:  OT General Charges $OT Visit: 1 Visit OT Evaluation $OT Eval Low Complexity: 1 Low  Arman Filter., MPH, MS, OTR/L ascom [phone] 08/08/23, 3:26 PM

## 2023-08-08 NOTE — Evaluation (Addendum)
 Physical Therapy Evaluation Patient Details Name: Jeremy [name] MRN: [id number] DOB: 1956/06/04 Today's Date: 08/08/2023  History of Present Illness  Pt is a 68 y.o. male presenting to hospital 08/06/23 with c/o palpitations; per chart pt recently left AMA (pt hospitalized with septic shock secondary to ESBL E. Coli UTI with an acute metabolic encephalopathy).  Pt admitted with sepsis d/t gram-negative UTI, acute metabolic encephalopathy, DM, CAD.  PMH includes CHF, CAD, s/p CABG, DM type 2, htn, pulmonary thrombectomy 2022.  Clinical Impression  PT/OT co-evaluation performed.  Prior to recent medical concerns, pt reports being independent with ambulation; currently does not have a place to live.  No c/o pain during session.  Currently pt is modified independent with bed mobility; CGA with transfer from bed; and CGA to ambulate 120 feet (no AD use); 2nd assist for safety/lines/leads during functional mobility; pt appearing impulsive during session.  Pt noted with R ankle/foot swelling (pt reports chronic from h/o ankle fx) and ambulated with R LE externally rotated with increased lateral sway (pt reports typical gait mechanics).  Pt would currently benefit from skilled PT to address noted impairments and functional limitations (see below for any additional details).  Upon hospital discharge, pt would benefit from ongoing therapy.     If plan is discharge home, recommend the following: A little help with walking and/or transfers;A little help with bathing/dressing/bathroom;Assist for transportation;Help with stairs or ramp for entrance   Can travel by private vehicle        Equipment Recommendations None recommended by PT  Recommendations for Other Services       Functional Status Assessment Patient has had a recent decline in their functional status and demonstrates the ability to make significant improvements in function in a reasonable and predictable amount of time.     Precautions /  Restrictions Precautions Precautions: Fall Recall of Precautions/Restrictions: Intact Restrictions Weight Bearing Restrictions Per Provider Order: No      Mobility  Bed Mobility Overal bed mobility: Modified Independent Bed Mobility: Supine to Sit, Sit to Supine     Supine to sit: Modified independent (Device/Increase time) Sit to supine: Modified independent (Device/Increase time)   General bed mobility comments: No difficulties noted    Transfers Overall transfer level: Needs assistance Equipment used: None Transfers: Sit to/from Stand Sit to Stand: Contact guard assist, +2 safety/equipment           General transfer comment: CGA for safety    Ambulation/Gait Ambulation/Gait assistance: Contact guard assist, +2 safety/equipment Gait Distance (Feet): 120 Feet Assistive device: None Gait Pattern/deviations: Step-through pattern Gait velocity: decreased     General Gait Details: R LE externally rotated; increased R lateral sway; increased R LE stance time  Stairs            Wheelchair Mobility     Tilt Bed    Modified Rankin (Stroke Patients Only)       Balance Overall balance assessment: Needs assistance Sitting-balance support: No upper extremity supported, Feet supported Sitting balance-Leahy Scale: Good Sitting balance - Comments: steady in sitting adjusting his socks   Standing balance support: No upper extremity supported, During functional activity Standing balance-Leahy Scale: Fair Standing balance comment: no loss of balance noted with ambulation although altered gait mechanics noted                             Pertinent Vitals/Pain Pain Assessment Pain Assessment: No/denies pain    Home Living Family/patient expects  to be discharged to:: Shelter/Homeless                   Additional Comments: Per chart pt recently from jail; left hospital AMA; pt denies having a home available to him    Prior Function Prior  Level of Function : Independent/Modified Independent             Mobility Comments: Pt reports being independent with ambulation. ADLs Comments: Pt reports modified indepenent/independent with ADL/IADL's.     Extremity/Trunk Assessment   Upper Extremity Assessment Upper Extremity Assessment: Overall WFL for tasks assessed    Lower Extremity Assessment Lower Extremity Assessment: Generalized weakness (R ankle appearing weaker in general)    Cervical / Trunk Assessment Cervical / Trunk Assessment: Normal  Communication   Communication Communication: No apparent difficulties    Cognition Arousal: Alert Behavior During Therapy: Impulsive   PT - Cognitive impairments: No apparent impairments                         Following commands: Intact       Cueing Cueing Techniques: Verbal cues, Visual cues     General Comments General comments (skin integrity, edema, etc.): R ankle swelling noted (pt reports chronic d/t h/o R ankle fx); HR 95-123 bpm with exertion (A-fib); SpO2 sats on room air >96%    Exercises     Assessment/Plan    PT Assessment Patient needs continued PT services  PT Problem List Decreased strength;Decreased activity tolerance;Decreased balance;Decreased mobility;Decreased knowledge of use of DME       PT Treatment Interventions DME instruction;Gait training;Functional mobility training;Therapeutic activities;Therapeutic exercise;Balance training;Patient/family education    PT Goals (Current goals can be found in the Care Plan section)  Acute Rehab PT Goals Patient Stated Goal: to find a home PT Goal Formulation: With patient Time For Goal Achievement: 08/22/23 Potential to Achieve Goals: Good    Frequency Min 1X/week     Co-evaluation PT/OT/SLP Co-Evaluation/Treatment: Yes Reason for Co-Treatment: To address functional/ADL transfers;For patient/therapist safety PT goals addressed during session: Mobility/safety with mobility OT  goals addressed during session: ADL's and self-care       AM-PAC PT "6 Clicks" Mobility  Outcome Measure Help needed turning from your back to your side while in a flat bed without using bedrails?: None Help needed moving from lying on your back to sitting on the side of a flat bed without using bedrails?: None Help needed moving to and from a bed to a chair (including a wheelchair)?: A Little Help needed standing up from a chair using your arms (e.g., wheelchair or bedside chair)?: A Little Help needed to walk in hospital room?: A Little Help needed climbing 3-5 steps with a railing? : A Little 6 Click Score: 20    End of Session Equipment Utilized During Treatment: Gait belt Activity Tolerance: Patient tolerated treatment well Patient left: in bed;with call bell/phone within reach;with bed alarm set Nurse Communication: Mobility status;Precautions;Other (comment) (Pt's HR during session) PT Visit Diagnosis: Muscle weakness (generalized) (M62.81);Difficulty in walking, not elsewhere classified (R26.2)    Time: 1424-1440 PT Time Calculation (min) (ACUTE ONLY): 16 min   Charges:   PT Evaluation $PT Eval Low Complexity: 1 Low   PT General Charges $$ ACUTE PT VISIT: 1 Visit        Hendricks Limes, PT 08/08/23, 3:51 PM

## 2023-08-08 NOTE — Plan of Care (Signed)
  Problem: Nutrition: Goal: Adequate nutrition will be maintained Outcome: Progressing   Problem: Elimination: Goal: Will not experience complications related to urinary retention Outcome: Progressing   Problem: Pain Managment: Goal: General experience of comfort will improve and/or be controlled Outcome: Progressing   Problem: Safety: Goal: Ability to remain free from injury will improve Outcome: Progressing   Problem: Skin Integrity: Goal: Risk for impaired skin integrity will decrease Outcome: Progressing

## 2023-08-08 NOTE — Progress Notes (Signed)
 PROGRESS NOTE    Jeremy [name]  WUJ:[id number] DOB: 06-Aug-1955 DOA: 08/06/2023 PCP: Barbette Reichmann, MD   Assessment & Plan:   Principal Problem:   Sepsis due to gram-negative UTI [facility]) Active Problems:   Acute metabolic encephalopathy   Essential hypertension   Dyslipidemia   Paroxysmal atrial fibrillation (HCC)   GERD without esophagitis   Coronary artery disease   Type 2 diabetes mellitus with chronic kidney disease, with long-term current use of insulin (HCC)   Sepsis (HCC)  Assessment and Plan: Sepsis: likely secondary to UTI. See Dr. Chrys Racer note on how he met criteria. Continue on IV merrem w/ recent ESBL. Continue on IVFs. Pt left AMA on 08/04/23  UTI: urine cx shows insignificant growth. Continue on IV merrem x 5 days   Acute metabolic encephalopathy: likely secondary to above. Oriented to person and place only. Continue on IV abxs. Re-orient prn    DM2: well controlled, HbA1c 6.4. Continue on SSI w/ accuchecks   Generalized weakness: PT/OT consulted   Hyperkalemia: lokelma x1 ordered.    Hx of CAD: continue on statin, aspirin. Holding entresto, coreg, torsemide in setting of sepsis    GERD: continue on PPI     PAF: continue on xarelto, amio. Holding coreg in setting of sepsis   HLD: continue on statin    HTN: holding coreg, torsemide, entresto in setting of sepsis   Obesity: BMI 30.5. Would benefit from weight loss     DVT prophylaxis: xarelto  Code Status: full  Family Communication:  Disposition Plan: depends on PT/OT recs   Level of care: Telemetry Medical  Status is: Inpatient Remains inpatient appropriate because: severity of illness, has hyperkalemia and weakness    Consultants:    Procedures:   Antimicrobials: merrem  Subjective: Pt c/o generalized weakness   Objective: Vitals:   08/08/23 0002 08/08/23 0227 08/08/23 0242 08/08/23 0828  BP: 98/84 (!) 99/55  (!) 88/52  Pulse: 98 (!) 126  (!) 122  Resp: 20   16  Temp: 98.1  F (36.7 C) 98.5 F (36.9 C) 99.1 F (37.3 C) 98.3 F (36.8 C)  TempSrc: Oral Oral Rectal Oral  SpO2: 96%   100%  Weight:      Height:        Intake/Output Summary (Last 24 hours) at 08/08/2023 0838 Last data filed at 08/08/2023 0538 Gross per 24 hour  Intake 2088.04 ml  Output 2900 ml  Net -811.96 ml   Filed Weights   08/07/23 0500  Weight: 99.3 kg    Examination:  General exam: Appears comfortable  Respiratory system: clear breath sounds b/l  Cardiovascular system: S1/S2+. No rubs or clicks  Gastrointestinal system: abd is soft, NT, obese & normal bowel sounds  Central nervous system: Alert and oriented x 2 only.  Psychiatry: Judgement and insight appears poor. Flat mood and affect    Data Reviewed: I have personally reviewed following labs and imaging studies  CBC: Recent Labs  Lab 08/03/23 0444 08/04/23 0421 08/06/23 1941 08/07/23 0647  WBC 13.6* 18.0* 13.5* 10.6*  HGB 11.6* 11.4* 11.2* 10.9*  HCT 34.8* 33.4* 34.6* 33.6*  MCV 91.6 91.5 96.4 96.3  PLT 675* 757* 610* 541*   Basic Metabolic Panel: Recent Labs  Lab 08/02/23 0628 08/03/23 0444 08/03/23 1557 08/04/23 0421 08/06/23 1941 08/07/23 0647  NA 136 134*  --  138 137 140  K 5.0 5.2* 4.9 4.4 5.4* 4.8  CL 110 108  --  106 108 112*  CO2 18* 17*  --  22 18* 19*  GLUCOSE 160* 196*  --  270* 125* 166*  BUN 88* 74*  --  60* 38* 31*  CREATININE 2.65* 2.22*  --  2.01* 1.47* 1.44*  CALCIUM 8.5* 8.7*  --  8.9 9.1 8.7*  MG 2.6* 2.7* 2.7* 2.6*  --   --   PHOS 3.8 4.1  --  3.1  --   --    GFR: Estimated Creatinine Clearance: 60.6 mL/min (A) (by C-G formula based on SCr of 1.44 mg/dL (H)). Liver Function Tests: Recent Labs  Lab 08/03/23 0444 08/04/23 0421 08/06/23 1941  AST  --   --  20  ALT  --   --  20  ALKPHOS  --   --  66  BILITOT  --   --  0.7  PROT  --   --  7.2  ALBUMIN 2.2* 2.4* 3.0*   No results for input(s): "LIPASE", "AMYLASE" in the last 168 hours. No results for input(s):  "AMMONIA" in the last 168 hours. Coagulation Profile: Recent Labs  Lab 08/07/23 0647  INR 1.2   Cardiac Enzymes: No results for input(s): "CKTOTAL", "CKMB", "CKMBINDEX", "TROPONINI" in the last 168 hours. BNP (last 3 results) No results for input(s): "PROBNP" in the last 8760 hours. HbA1C: No results for input(s): "HGBA1C" in the last 72 hours. CBG: Recent Labs  Lab 08/03/23 1932 08/03/23 2331 08/04/23 0259 08/04/23 0803 08/04/23 1129  GLUCAP 240* 223* 209* 241* 228*   Lipid Profile: No results for input(s): "CHOL", "HDL", "LDLCALC", "TRIG", "CHOLHDL", "LDLDIRECT" in the last 72 hours. Thyroid Function Tests: No results for input(s): "TSH", "T4TOTAL", "FREET4", "T3FREE", "THYROIDAB" in the last 72 hours. Anemia Panel: No results for input(s): "VITAMINB12", "FOLATE", "FERRITIN", "TIBC", "IRON", "RETICCTPCT" in the last 72 hours. Sepsis Labs: Recent Labs  Lab 08/06/23 2048 08/07/23 0005  LATICACIDVEN 2.6* 2.2*    Recent Results (from the past 240 hours)  Culture, blood (Routine x 2)     Status: Abnormal   Collection Time: 07/31/23 11:29 AM   Specimen: BLOOD  Result Value Ref Range Status   Specimen Description   Final    BLOOD RIGHT ANTECUBITAL Performed at [facility], [address], [place], [place] [address]    Special Requests   Final    BOTTLES DRAWN AEROBIC AND ANAEROBIC Blood Culture results may not be optimal due to an inadequate volume of blood received in culture bottles Performed at [facility], [address], [place], [place] [address]    Culture  Setup Time   Final    GRAM NEGATIVE RODS ANAEROBIC BOTTLE ONLY Organism ID to follow CRITICAL RESULT CALLED TO, READ BACK BY AND VERIFIED WITH: WALID NAZARI @ 0981 08/01/23 LFD    Culture (A)  Final    ESCHERICHIA COLI Confirmed Extended Spectrum Beta-Lactamase Producer (ESBL).  In bloodstream infections from ESBL organisms, carbapenems are preferred over piperacillin/tazobactam.  They are shown to have a lower risk of mortality.    Report Status 08/03/2023 FINAL  Final   Organism ID, Bacteria ESCHERICHIA COLI  Final      Susceptibility   Escherichia coli - MIC*    AMPICILLIN >=32 RESISTANT Resistant     CEFEPIME >=32 RESISTANT Resistant     CEFTAZIDIME RESISTANT Resistant     CEFTRIAXONE >=64 RESISTANT Resistant     CIPROFLOXACIN >=4 RESISTANT Resistant     GENTAMICIN <=1 SENSITIVE Sensitive     IMIPENEM <=0.25 SENSITIVE Sensitive     TRIMETH/SULFA <=20 SENSITIVE Sensitive  AMPICILLIN/SULBACTAM >=32 RESISTANT Resistant     PIP/TAZO 8 SENSITIVE Sensitive ug/mL    * ESCHERICHIA COLI  Blood Culture ID Panel (Reflexed)     Status: Abnormal   Collection Time: 07/31/23 11:29 AM  Result Value Ref Range Status   Enterococcus faecalis NOT DETECTED NOT DETECTED Final   Enterococcus Faecium NOT DETECTED NOT DETECTED Final   Listeria monocytogenes NOT DETECTED NOT DETECTED Final   Staphylococcus species NOT DETECTED NOT DETECTED Final   Staphylococcus aureus (BCID) NOT DETECTED NOT DETECTED Final   Staphylococcus epidermidis NOT DETECTED NOT DETECTED Final   Staphylococcus lugdunensis NOT DETECTED NOT DETECTED Final   Streptococcus species NOT DETECTED NOT DETECTED Final   Streptococcus agalactiae NOT DETECTED NOT DETECTED Final   Streptococcus pneumoniae NOT DETECTED NOT DETECTED Final   Streptococcus pyogenes NOT DETECTED NOT DETECTED Final   A.calcoaceticus-baumannii NOT DETECTED NOT DETECTED Final   Bacteroides fragilis NOT DETECTED NOT DETECTED Final   Enterobacterales DETECTED (A) NOT DETECTED Final    Comment: Enterobacterales represent a large order of gram negative bacteria, not a single organism. CRITICAL RESULT CALLED TO, READ BACK BY AND VERIFIED WITH: WALID NAZARI @ 7829 08/01/23 LFD    Enterobacter cloacae complex NOT DETECTED NOT DETECTED Final   Escherichia coli DETECTED (A) NOT DETECTED Final    Comment: CRITICAL RESULT CALLED TO, READ BACK BY  AND VERIFIED WITH: WALID NAZARI @ 0042 08/01/23 LFD    Klebsiella aerogenes NOT DETECTED NOT DETECTED Final   Klebsiella oxytoca NOT DETECTED NOT DETECTED Final   Klebsiella pneumoniae NOT DETECTED NOT DETECTED Final   Proteus species NOT DETECTED NOT DETECTED Final   Salmonella species NOT DETECTED NOT DETECTED Final   Serratia marcescens NOT DETECTED NOT DETECTED Final   Haemophilus influenzae NOT DETECTED NOT DETECTED Final   Neisseria meningitidis NOT DETECTED NOT DETECTED Final   Pseudomonas aeruginosa NOT DETECTED NOT DETECTED Final   Stenotrophomonas maltophilia NOT DETECTED NOT DETECTED Final   Candida albicans NOT DETECTED NOT DETECTED Final   Candida auris NOT DETECTED NOT DETECTED Final   Candida glabrata NOT DETECTED NOT DETECTED Final   Candida krusei NOT DETECTED NOT DETECTED Final   Candida parapsilosis NOT DETECTED NOT DETECTED Final   Candida tropicalis NOT DETECTED NOT DETECTED Final   Cryptococcus neoformans/gattii NOT DETECTED NOT DETECTED Final   CTX-M ESBL DETECTED (A) NOT DETECTED Final    Comment: CRITICAL RESULT CALLED TO, READ BACK BY AND VERIFIED WITH: WALID NAZARI @0042  08/01/23 LFD (NOTE) Extended spectrum beta-lactamase detected. Recommend a carbapenem as initial therapy.      Carbapenem resistance IMP NOT DETECTED NOT DETECTED Final   Carbapenem resistance KPC NOT DETECTED NOT DETECTED Final   Carbapenem resistance NDM NOT DETECTED NOT DETECTED Final   Carbapenem resist OXA 48 LIKE NOT DETECTED NOT DETECTED Final   Carbapenem resistance VIM NOT DETECTED NOT DETECTED Final    Comment: Performed at [facility], [address] [place], [place] [address]  Culture, blood (Routine x 2)     Status: None   Collection Time: 07/31/23 11:36 AM   Specimen: BLOOD  Result Value Ref Range Status   Specimen Description BLOOD LEFT ANTECUBITAL  Final   Special Requests   Final    BOTTLES DRAWN AEROBIC AND ANAEROBIC Blood Culture results may not be  optimal due to an inadequate volume of blood received in culture bottles   Culture   Final    NO GROWTH 5 DAYS Performed at [facility], [address] [place], [place] [address]    Report Status 08/05/2023 FINAL  Final  Resp panel by RT-PCR (RSV, Flu A&B, Covid) Anterior Nasal Swab     Status: None   Collection Time: 07/31/23 12:50 PM   Specimen: Anterior Nasal Swab  Result Value Ref Range Status   SARS Coronavirus 2 by RT PCR NEGATIVE NEGATIVE Final    Comment: (NOTE) SARS-CoV-2 target nucleic acids are NOT DETECTED.  The SARS-CoV-2 RNA is generally detectable in upper respiratory specimens during the acute phase of infection. The lowest concentration of SARS-CoV-2 viral copies this assay can detect is 138 copies/mL. A negative result does not preclude SARS-Cov-2 infection and should not be used as the sole basis for treatment or other patient management decisions. A negative result may occur with  improper specimen collection/handling, submission of specimen other than nasopharyngeal swab, presence of viral mutation(s) within the areas targeted by this assay, and inadequate number of viral copies(<138 copies/mL). A negative result must be combined with clinical observations, patient history, and epidemiological information. The expected result is Negative.  Fact Sheet for Patients:  BloggerCourse.com  Fact Sheet for Healthcare Providers:  SeriousBroker.it  This test is no t yet approved or cleared by the [place] FDA and  has been authorized for detection and/or diagnosis of SARS-CoV-2 by FDA under an Emergency Use Authorization (EUA). This EUA will remain  in effect (meaning this test can be used) for the duration of the COVID-19 declaration under Section 564(b)(1) of the Act, 21 U.S.C.section 360bbb-3(b)(1), unless the authorization is terminated  or revoked sooner.       Influenza A by PCR NEGATIVE  NEGATIVE Final   Influenza B by PCR NEGATIVE NEGATIVE Final    Comment: (NOTE) The Xpert Xpress SARS-CoV-2/FLU/RSV plus assay is intended as an aid in the diagnosis of influenza from Nasopharyngeal swab specimens and should not be used as a sole basis for treatment. Nasal washings and aspirates are unacceptable for Xpert Xpress SARS-CoV-2/FLU/RSV testing.  Fact Sheet for Patients: BloggerCourse.com  Fact Sheet for Healthcare Providers: SeriousBroker.it  This test is not yet approved or cleared by the [place] FDA and has been authorized for detection and/or diagnosis of SARS-CoV-2 by FDA under an Emergency Use Authorization (EUA). This EUA will remain in effect (meaning this test can be used) for the duration of the COVID-19 declaration under Section 564(b)(1) of the Act, 21 U.S.C. section 360bbb-3(b)(1), unless the authorization is terminated or revoked.     Resp Syncytial Virus by PCR NEGATIVE NEGATIVE Final    Comment: (NOTE) Fact Sheet for Patients: BloggerCourse.com  Fact Sheet for Healthcare Providers: SeriousBroker.it  This test is not yet approved or cleared by the [place] FDA and has been authorized for detection and/or diagnosis of SARS-CoV-2 by FDA under an Emergency Use Authorization (EUA). This EUA will remain in effect (meaning this test can be used) for the duration of the COVID-19 declaration under Section 564(b)(1) of the Act, 21 U.S.C. section 360bbb-3(b)(1), unless the authorization is terminated or revoked.  Performed at [name], [address], [place], [place] [address]   Urine Culture     Status: Abnormal   Collection Time: 07/31/23  3:53 PM   Specimen: Urine, Random  Result Value Ref Range Status   Specimen Description   Final    URINE, RANDOM Performed at [facility], [address], [place], [place]  [address]    Special Requests   Final    NONE Reflexed from [phone] Performed  at [facility], [address] [place], [place] [address]    Culture >=100,000 COLONIES/mL ESCHERICHIA COLI (A)  Final   Report Status 08/02/2023 FINAL  Final   Organism ID, Bacteria ESCHERICHIA COLI (A)  Final      Susceptibility   Escherichia coli - MIC*    AMPICILLIN >=32 RESISTANT Resistant     CEFAZOLIN >=64 RESISTANT Resistant     CEFEPIME 16 RESISTANT Resistant     CEFTRIAXONE >=64 RESISTANT Resistant     CIPROFLOXACIN >=4 RESISTANT Resistant     GENTAMICIN <=1 SENSITIVE Sensitive     IMIPENEM <=0.25 SENSITIVE Sensitive     NITROFURANTOIN 128 RESISTANT Resistant     TRIMETH/SULFA <=20 SENSITIVE Sensitive     AMPICILLIN/SULBACTAM >=32 RESISTANT Resistant     PIP/TAZO 8 SENSITIVE Sensitive ug/mL    * >=100,000 COLONIES/mL ESCHERICHIA COLI  MRSA Next Gen by PCR, Nasal     Status: None   Collection Time: 07/31/23  5:06 PM  Result Value Ref Range Status   MRSA by PCR Next Gen NOT DETECTED NOT DETECTED Final    Comment: (NOTE) The GeneXpert MRSA Assay (FDA approved for NASAL specimens only), is one component of a comprehensive MRSA colonization surveillance program. It is not intended to diagnose MRSA infection nor to guide or monitor treatment for MRSA infections. Test performance is not FDA approved in patients less than 67 years old. Performed at [facility], [address] [place], [place] [address]   Respiratory (~20 pathogens) panel by PCR     Status: None   Collection Time: 08/01/23  8:11 AM  Result Value Ref Range Status   Adenovirus NOT DETECTED NOT DETECTED Final   Coronavirus 229E NOT DETECTED NOT DETECTED Final    Comment: (NOTE) The Coronavirus on the Respiratory Panel, DOES NOT test for the novel  Coronavirus (2019 nCoV)    Coronavirus HKU1 NOT DETECTED NOT DETECTED Final   Coronavirus NL63 NOT DETECTED NOT DETECTED Final   Coronavirus OC43 NOT DETECTED NOT  DETECTED Final   Metapneumovirus NOT DETECTED NOT DETECTED Final   Rhinovirus / Enterovirus NOT DETECTED NOT DETECTED Final   Influenza A NOT DETECTED NOT DETECTED Final   Influenza B NOT DETECTED NOT DETECTED Final   Parainfluenza Virus 1 NOT DETECTED NOT DETECTED Final   Parainfluenza Virus 2 NOT DETECTED NOT DETECTED Final   Parainfluenza Virus 3 NOT DETECTED NOT DETECTED Final   Parainfluenza Virus 4 NOT DETECTED NOT DETECTED Final   Respiratory Syncytial Virus NOT DETECTED NOT DETECTED Final   Bordetella pertussis NOT DETECTED NOT DETECTED Final   Bordetella Parapertussis NOT DETECTED NOT DETECTED Final   Chlamydophila pneumoniae NOT DETECTED NOT DETECTED Final   Mycoplasma pneumoniae NOT DETECTED NOT DETECTED Final    Comment: Performed at [facility], [address], [place], [place] [address]  Resp panel by RT-PCR (RSV, Flu A&B, Covid) Anterior Nasal Swab     Status: None   Collection Time: 08/06/23  8:48 PM   Specimen: Anterior Nasal Swab  Result Value Ref Range Status   SARS Coronavirus 2 by RT PCR NEGATIVE NEGATIVE Final    Comment: (NOTE) SARS-CoV-2 target nucleic acids are NOT DETECTED.  The SARS-CoV-2 RNA is generally detectable in upper respiratory specimens during the acute phase of infection. The lowest concentration of SARS-CoV-2 viral copies this assay can detect is 138 copies/mL. A negative result does not preclude SARS-Cov-2 infection and should not be used as the sole basis for treatment or  other patient management decisions. A negative result may occur with  improper specimen collection/handling, submission of specimen other than nasopharyngeal swab, presence of viral mutation(s) within the areas targeted by this assay, and inadequate number of viral copies(<138 copies/mL). A negative result must be combined with clinical observations, patient history, and epidemiological information. The expected result is Negative.  Fact Sheet for Patients:   BloggerCourse.com  Fact Sheet for Healthcare Providers:  SeriousBroker.it  This test is no t yet approved or cleared by the [place] FDA and  has been authorized for detection and/or diagnosis of SARS-CoV-2 by FDA under an Emergency Use Authorization (EUA). This EUA will remain  in effect (meaning this test can be used) for the duration of the COVID-19 declaration under Section 564(b)(1) of the Act, 21 U.S.C.section 360bbb-3(b)(1), unless the authorization is terminated  or revoked sooner.       Influenza A by PCR NEGATIVE NEGATIVE Final   Influenza B by PCR NEGATIVE NEGATIVE Final    Comment: (NOTE) The Xpert Xpress SARS-CoV-2/FLU/RSV plus assay is intended as an aid in the diagnosis of influenza from Nasopharyngeal swab specimens and should not be used as a sole basis for treatment. Nasal washings and aspirates are unacceptable for Xpert Xpress SARS-CoV-2/FLU/RSV testing.  Fact Sheet for Patients: BloggerCourse.com  Fact Sheet for Healthcare Providers: SeriousBroker.it  This test is not yet approved or cleared by the [place] FDA and has been authorized for detection and/or diagnosis of SARS-CoV-2 by FDA under an Emergency Use Authorization (EUA). This EUA will remain in effect (meaning this test can be used) for the duration of the COVID-19 declaration under Section 564(b)(1) of the Act, 21 U.S.C. section 360bbb-3(b)(1), unless the authorization is terminated or revoked.     Resp Syncytial Virus by PCR NEGATIVE NEGATIVE Final    Comment: (NOTE) Fact Sheet for Patients: BloggerCourse.com  Fact Sheet for Healthcare Providers: SeriousBroker.it  This test is not yet approved or cleared by the [place] FDA and has been authorized for detection and/or diagnosis of SARS-CoV-2 by FDA under an Emergency Use  Authorization (EUA). This EUA will remain in effect (meaning this test can be used) for the duration of the COVID-19 declaration under Section 564(b)(1) of the Act, 21 U.S.C. section 360bbb-3(b)(1), unless the authorization is terminated or revoked.  Performed at [facility], [address] [place], [place] [address]   Blood Culture (routine x 2)     Status: None (Preliminary result)   Collection Time: 08/06/23  8:48 PM   Specimen: BLOOD  Result Value Ref Range Status   Specimen Description BLOOD LFA  Final   Special Requests   Final    BOTTLES DRAWN AEROBIC AND ANAEROBIC Blood Culture results may not be optimal due to an inadequate volume of blood received in culture bottles   Culture   Final    NO GROWTH 2 DAYS Performed at [facility], [address] [place], [place] [address]    Report Status PENDING  Incomplete  Blood Culture (routine x 2)     Status: None (Preliminary result)   Collection Time: 08/06/23  8:48 PM   Specimen: BLOOD  Result Value Ref Range Status   Specimen Description BLOOD RFA  Final   Special Requests   Final    BOTTLES DRAWN AEROBIC AND ANAEROBIC Blood Culture results may not be optimal due to an inadequate volume of blood received in culture bottles   Culture   Final    NO GROWTH 2 DAYS  Performed at [facility], [address] [place], [place] [address]    Report Status PENDING  Incomplete         Radiology Studies: DG Chest 2 View Result Date: 08/06/2023 CLINICAL DATA:  Chest pain EXAM: CHEST - 2 VIEW COMPARISON:  07/31/2023 FINDINGS: 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 No focal airspace consolidation, pleural effusion, or pneumothorax. IMPRESSION: No active cardiopulmonary disease. Electronically Signed   By: Duanne Guess D.O.   On: 08/06/2023 20:06        Scheduled Meds:  amiodarone  200 mg Oral Daily   aspirin  81 mg Oral Daily   atorvastatin  40 mg Oral Daily   pantoprazole  20 mg  Oral Daily   rivaroxaban  20 mg Oral Q supper   Continuous Infusions:  lactated ringers 75 mL/hr at 08/08/23 0345   meropenem (MERREM) IV 1 g (08/08/23 0538)     LOS: 1 day        Charise Killian, MD [facility] Pager [phone]  If 7PM-7AM, please contact night-coverage 08/08/2023, 8:38 AM

## 2023-08-09 DIAGNOSIS — A4150 Gram-negative sepsis, unspecified: Secondary | ICD-10-CM | POA: Diagnosis not present

## 2023-08-09 DIAGNOSIS — N390 Urinary tract infection, site not specified: Secondary | ICD-10-CM | POA: Diagnosis not present

## 2023-08-09 LAB — BASIC METABOLIC PANEL
Anion gap: 6 (ref 5–15)
BUN: 27 mg/dL — ABNORMAL HIGH (ref 8–23)
CO2: 24 mmol/L (ref 22–32)
Calcium: 8.6 mg/dL — ABNORMAL LOW (ref 8.9–10.3)
Chloride: 107 mmol/L (ref 98–111)
Creatinine, Ser: 1.67 mg/dL — ABNORMAL HIGH (ref 0.61–1.24)
GFR, Estimated: 45 mL/min — ABNORMAL LOW (ref >60)
Glucose, Bld: 147 mg/dL — ABNORMAL HIGH (ref 70–99)
Potassium: 4.9 mmol/L (ref 3.5–5.1)
Sodium: 137 mmol/L (ref 135–145)

## 2023-08-09 LAB — CBC
HCT: 29.7 % — ABNORMAL LOW (ref 39.0–52.0)
Hemoglobin: 9.9 g/dL — ABNORMAL LOW (ref 13.0–17.0)
MCH: 31 pg (ref 26.0–34.0)
MCHC: 33.3 g/dL (ref 30.0–36.0)
MCV: 93.1 fL (ref 80.0–100.0)
Platelets: 489 10*3/uL — ABNORMAL HIGH (ref 150–400)
RBC: 3.19 MIL/uL — ABNORMAL LOW (ref 4.22–5.81)
RDW: 14.9 % (ref 11.5–15.5)
WBC: 8.8 10*3/uL (ref 4.0–10.5)
nRBC: 0 % (ref 0.0–0.2)

## 2023-08-09 NOTE — Discharge Instructions (Signed)
 Rent/Utility/Housing  Agency Name: [facility] Agency Address: [address] [place], [place] [address] Phone: [phone] Email: [email or web address] Website: [email or web address] Service(s) Offered: Housing services, self-sufficiency, congregate meal program, weatherization program, Field seismologist program, emergency food assistance,  housing counseling, home ownership program, wheels -towork program.  Agency Name: [facility] Address: [address] [place], [place] [address] Phone: [phone] (8a-4p) [phone] (8p- 10p) Email: [email or web address] Website: [email or web address] Service(s) Offered: A program for homeless and/or needy men that includes one-on-one counseling, life skills training and job rehabilitation.  Agency Name: [facility] [place] Address: [address] [place], [place] [address] Phone: [phone] Website: www.[email or web address].org Service(s) Offered: Assistance to needy in emergency with utility bills, heating fuel, and prescriptions. Shelter for homeless 7pm-7am. October 07, 2016 15  Agency Name: [facility] [place] (Developmentally Disabled) Address: [address], [place], [place] [address] Phone: [phone] Contact Person: Cathleen Corti Email: [email or web address] Website: [email or web address] Service(s) Offered: Helps individuals with developmental disabilities move from housing that is more restrictive to homes where they  can achieve greater independence and have more  opportunities.  Agency Name: [facility] Address: [address], [place], [place] [address] Phone: [phone] Email: [email or web address] Website: [email or web address].org Service(s) Offered: Provides affordable housing for low-income families, elderly, and disabled individuals. Offer a wide range of  programs and services, from financial planning to  afterschool and summer programs.  Agency Name: [facility] Social Services Address: [address] [place], [place] [address] Phone: [phone] Service(s) Offered: Child support services; child welfare services; food stamps; Medicaid; work first family assistance; and aid with fuel,  rent, food and medicine.  Agency Name: [facility] [place] [employer]. Address: [facility] [address], [place], [place]  [address] Phone: [phone] Website: www.familyabuseservices.org Service(s) Offered: 24 hour Crisis Line: [phone]; 24 hour Emergency Shelter; Transitional Housing; Support Groups; Scientist, physiological; [employer]; Hispanic Outreach: [phone];  Visitation Center: [phone].  Agency Name: [facility] [place]. Address: [address], [place], [place] [address] Phone: [phone] Service(s) Offered: CAP Services; Home and [employer]; Individual or Group Supports; Respite Care Non-Institutional Nursing;  Residential Supports; Respite Care and Personal Care Services; Transportation; Family and Friends Night; Recreational Activities; Three Nutritious Meals/Snacks; Consultation with Registered Dietician; Twenty-four hour Registered Nurse Access; Daily and [employer]; [place]aves; [place] for the [employer] (During Summer Months) Bingo Night (Every  Wednesday Night); Special Populations Dance Night  (Every Tuesday Night); Professional Hair Care Services.  Agency Name: [facility] Address: [address], [place], [place] [address] Phone: [phone] Contact Person: Jabier Mutton Website: [email or web address].Physicist, medical) Offered: Residential treatment facility for women; food and  clothing, educational & employment development and  transportation to work; Counsellor of financial skills;  parenting and family reunification; emotional and spiritual  support;  transitional housing for program graduates.  Agency Name: [facility] Address: [address], [place], [place] [address] Phone: [phone] Email: [email or web address] Website: [email or web address].es Service(s) Offered: Public housing units for elderly, disabled, and low income people; housing choice vouchers for income eligible  applicants; shelter plus care vouchers; and Psychologist, clinical.  Agency Name: [facility] [employer] Address: [address] [place], [place] [address] Phone: [phone] Email: [email or web address] Website: www.habitatalamance.org Service(s) Offered: Build houses for families in need of decent housing. Each adult in the family must invest 200 hours of labor on  someone else's house, work with volunteers to build their own house, attend classes  on budgeting, home maintenance, yard care, and attend homeowner association meetings.  Agency Name: [facility] Address: [address], [place], [place] [address] Phone: [phone] Website: www.rsli.org Service(s) Offered: Intermediate care facilities for intellectually delayed, Supervised Living in group homes for adults with developmental disabilities, Supervised Living for people who have dual diagnoses (MRMI), Independent Living, Supported Living, respite and a variety of CAP services, pre-vocational services, day supports, and [employer].  Agency Name: [place] [facility] Phone: [phone] Website: www.NCForeclosurePrevention.gov Service(s) Offered: Zero-interest, deferred loans to homeowners struggling to pay their mortgage. Call for more information. Agency Name: [facility] Address: [address], [place], [place] [address] Phone: [phone] Website: www.alamanceservices.org Service(s) Offered: Housing services, self-sufficiency, congregate meal program, and individual development account  program.  Agency Name: [facility] [place] Address: [address], [place], [place] [address] Phone: [phone] Email: [email or web address] Website: www.alliedchurches.org Service(s) Offered: Housing the homeless, feeding the hungry, Company secretary, job and education related services.  Agency Name: [facility] Address: [address], [place], [place] [address] Phone: [phone] Email: [email or web address] Service(s) Offered: Counseling, problem pregnancy, advocacy for Hispanics, limited emergency financial assistance.  Agency Name: Department of Social Services Address: [address] [place], [place] [address] Phone: [phone] Website: [email or web address] Service(s) Offered: Child support services; child welfare services; SNAP; Medicaid; work first family assistance; and aid with fuel,  rent, food and medicine.  Agency Name: [facility] Address: [address], [place], [place] [address] Phone: [phone] or [phone] Email: [email or web address] Service(s) Offered: Family services and transient assistance; emergency food, fuel, clothing, limited furniture, utilities; budget counseling, general counseling; give a kid a coat; thrift store; Christmas food and toys. Utility assistance, food pantry, rental  assistance, life sustaining medicine Transportation Resources  Agency Name: [facility] Address: [address] [place], [place] [address] Phone: [phone] Email: [email or web address] Website: www.[email or web address] Service(s) Offered: Housing services, self-sufficiency, congregate meal program, weatherization program, Field seismologist program, emergency food assistance,  housing counseling, home ownership program, wheels-towork program.  Agency Name: [facility] [employer] [phone]) Address: [address],  [place], [place] [address] Phone: [phone] Website: www.acta-Palestine.[email or web address] Service(s) Offered: Transportation for [employer], subscription and demand response; Dial-a-Ride for citizens 33 years of age or older.  Agency Name: Department of Social Services Address: [address] [place], [place] [address] Phone: [phone] Service(s) Offered: Child support services; child welfare services; food stamps; Medicaid; work first family assistance; and aid with fuel,  rent, food and medicine, transportation assistance.  Agency Name: Disabled [employer] (DAV) Transportation  Network Phone: [phone] Service(s) Offered: Transports veterans to the [facility] medical center. Call  forty-eight hours in advance and leave the name, telephone  number, date, and time of appointment. Veteran will be  contacted by the driver the day before the appointment to  arrange a pick up point   Transportation Resources  Agency Name: [facility] Agency Address: [address] [place], [place] [address] Phone: [phone] Email: [email or web address] Website: www.[email or web address] Service(s) Offered: Housing services, self-sufficiency, congregate meal program, weatherization program, Field seismologist program, emergency food assistance,  housing counseling, home ownership program, wheels-towork program.  Agency Name: [facility] [employer] [phone]) Address: [address], [place], [place] [address] Phone: [phone] Website: [email or web address].acta-[email or web address] Service(s) Offered: Transportation for [employer], subscription and demand response; Dial-a-Ride for citizens 37 years of age or older.  Agency Name: Department of Social Services Address: [address], [place], [place] [address]  Phone: [phone] Service(s) Offered: Child support services; child welfare services; food stamps; Medicaid; work first family assistance; and aid  with fuel,  rent, food and medicine, transportation assistance.  Agency Name: Disabled [employer] (DAV) Transportation  Network Phone: [phone] Service(s) Offered: Transports veterans to the [facility] medical center. Call  forty-eight hours in advance and leave the name, telephone  number, date, and time of appointment. Veteran will be  contacted by the driver the day before the appointment to  arrange a pick up point    [employer] ACTA currently provides door to door services. ACTA connects with PART daily for services to [facility]. ACTA also performs contract services to [employer] operates 27 vehicles, all but 3 mini-vans are equipped with lifts for special needs as well as the general public. ACTA drivers are each CDL certified and trained in First Aid and CPR. ACTA was established in 2002 by [employer]. An independent Industrial/product designer. ACTA operates via Cytogeneticist with required Research scientist (physical sciences) from [place]. ACTA provides over 80,000 passenger trips each year, including [facility] and [employer] sites.  Call at least by 11 AM one business day prior to needing transportation  [employer].                      [place], [place] [address]     Office Hours: Monday-Friday  8 AM - 5 PM Food Resources  Agency Name: [facility] Agency Address: [address], [place], [place] [address] Phone: [phone] Website: www.[email or web address] Service(s) Offered: Housing services, self-sufficiency, congregate meal program, weatherization program, Event organiser program, emergency food assistance,  housing counseling, home ownership program, wheels - to work program.  [employer] free for 60 and older at various locations from [employer], Monday-Friday:  [employer], [address]  [place], [phone] -[facility], [address] [place] [phone]  -[facility], [address], [place] [phone]  -[address], [place], [phone]  Agency Name: [facility] Address: [phone] [address], [place], [place] [address] Phone: [phone] Website: www.[email or web address] Service(s) Offered: Home delivered hot, frozen, and emergency  meals. Grocery assistance program which matches  volunteers one-on-one with seniors unable to grocery shop  for themselves. Must be 60 years and older; less than 20  hours of in-home aide service, limited or no driving ability;  live alone or with someone with a disability; live in  [place].  Agency Name: [facility]ologist [facility]) Address: [address], [place], [place] [address] Phone: [phone] Service(s) Offered: Food is served to shut-ins, homeless, elderly, and low income people in the community every Saturday (11:30 am-12:30 pm) and Sunday (12:30 pm-1:30pm). Volunteers also offer help and encouragement in seeking employment,  and spiritual guidance.  Agency Name: Department of Social Services Address: [address] [place], [place] [address] Phone: [phone] Service(s) Offered: Child support services; child welfare services; food stamps; Medicaid; work first family assistance; and aid with fuel,  rent, food and medicine.  Agency Name: [facility] Address: [address], [place], [place] Phone: [phone] Website: www.[email or web address] Services Offered: Monday 10:00am-12:00, 8:00pm-9:00pm, and Friday 10:00am-12:00.  Agency Name: [facility] [place] Address: [address], [place], [place] [address] Phone: [phone] Website: www.[email or web address].org Service(s) Offered: Serves weekday meals, open from 11:30 am- 1:00 pm., and  6:30-7:30pm, Monday-Wednesday-Friday distributes  food 3:30-6pm, Monday-Wednesday-Friday.  Agency Name: [facility] Address: [address], [place], [place] Phone: [phone] Website: [email or web address] Services Offered: Distributes food the 4th Saturday of the month, starting at 8:00 am  Agency Name: [facility] Address: [phone] [address], [place], [place] [address] Phone: [phone] Website: [email or web address] Service(s) Offered: Bread of life, weekly food pantry. Open Wednesdays from 10:00am-noon.  Agency Name: [facility] [employer] Address: [address] [place], [place], [place] Phone: [phone] Services Offered: Distributes food 9am-1pm, Monday-Thursday. Call for details.  Agency Name: [facility] Address: [address], [place], [place] [address] Phone: [phone] Website: [email or web address] Service(s) Offered: Games developer. Call for assistance.  Agency Name: [facility] Address: [address] [place], [place] [address] Phone: [phone] Service Offered: Emergency Food Pantry. Call for appointment.  Agency Name: [facility] Address: [address], [place], [place] [address] Phone: [phone] Website: [email or web address] Services Offered: Games developer. Call for details  Agency Name: [facility] Address: [address] [place], [place] Phone: [phone] Website: [email or web address] Service(s) Offered: Emergency Food Pantry. Call for details.  Agency Name: [facility] Address: [address], [place], [place] [address] Phone: [phone] or [phone] Website: [email or web address] Service(s) Offered: Distribute food 9am-11:30 am, Tuesday-Friday, and 1-3:30pm, Monday-Friday. Food pantry Monday-Friday 1pm-3pm, fresh items, Mon.-Wed.-Fri.  Agency Name: [facility] Address: [address] [place], [place] [address] Phone:  [phone] Website: www.[email or web address] Services Offered: Distribute food Tues and Sats from 9:00am-noon. Closed 1st Saturday of each month. Call for details  Agency Name: [facility] Address: [facility] [address] [place] [address] Phone: [phone]  Services Offered: Delivers meals every Thursday  Agency Name: [facility] Agency Address: [address] [place], [place] [address] Phone: [phone] Website: www.[email or web address] Service(s) Offered: Housing services, self-sufficiency, congregate meal program, and individual development account program.  Agency Name: [facility] [place] Address: [address], [place], [place] [address] Phone: [phone] Email: [email or web address] Website: [email or web address] Service(s) Offered: Housing the homeless, feeding the hungry, Company secretary, job and education related services.  Agency Name: [facility] Address: [address] [place], [place] [address] Phone: [phone] Email: [email or web address] Service(s) Offered: Counseling, problem pregnancy, advocacy for Hispanics, limited emergency financial assistance.  Agency Name: Department of Social Services Address: [address] [place], [place] [address] Phone: [phone] Website: [email or web address] Service(s) Offered: Child support services; child welfare services; SNAP; Medicaid; work first family assistance; and aid with fuel,  rent, food and medicine.  Agency Name: [facility] Address: [address], [place], [place] [address] Phone: [phone] or [phone] Email: [email or web address] Service(s) Offered: Family services and transient assistance; emergency food, fuel, clothing, limited furniture, utilities; budget counseling, general counseling; give a kid a coat; thrift store; Christmas food and toys. Utility assistance, food pantry,  rental  assistance, life sustaining medicine

## 2023-08-09 NOTE — Progress Notes (Addendum)
 PROGRESS NOTE   HPI was taken from Dr. Arville Care: Jeremy [name] is a 68 y.o. African-American male with medical history significant for CHF, coronary artery disease, s/p CABG, type 2 diabetes mellitus and hypertension who presented to the emergency room with acute onset of altered mental status with confusion and somnolence.   He denied any chest pain but admitted to palpitations. He presented with similar symptoms from his correctional facility on 2/16 and was admitted for septic shock due to UTI with ESBL E. coli at which time his blood cultures also grew ESBL E. coli.  The patient unfortunately left AMA on 2/19 and comes today with recurrent symptoms.  He denied any current dysuria, oliguria or hematuria or urgency or frequency or flank pain.  No cough or wheezing or dyspnea.  No nausea or vomiting or abdominal pain.  He was fairly somnolent but arousable.   ED Course: When he came to the ER, BP was 135/98 with heart rate of 108 respiratory rate of 22 and later heart rate was up to 126 with otherwise normal vital signs.  Labs revealed a potassium of 5.4 and CO2 of 18 and a BUN of 38 with a creatinine 1.47 compared to 60 and 2.01 on 2/20.  High-sensitivity troponin was 19 and later 21.  Lactic acid was 2.6 and later 2.2.  CBC showed leukocytosis of 13.5 and anemia close to previous levels.  UA still showed 11-20 WBCs with 50 glucose.  Blood and urine cultures were sent. EKG as reviewed by me : EKG showed sinus tachycardia with rate 126 with possible left atrial enlargement and T wave inversion inferolaterally.  Imaging: Two-view chest x-ray showed no acute cardiopulmonary disease.   The patient was given IV meropenem and 1 L bolus of IV normal saline.  The patient will be admitted to an observation medical telemetry bed for further evaluation and management.   Jeremy [name]  ZOX:[id number] DOB: [birth year] DOA: 08/06/2023 PCP: Barbette Reichmann, MD   Assessment & Plan:   Principal Problem:    Sepsis due to gram-negative UTI [facility]) Active Problems:   Acute metabolic encephalopathy   Essential hypertension   Dyslipidemia   Paroxysmal atrial fibrillation (HCC)   GERD without esophagitis   Coronary artery disease   Type 2 diabetes mellitus with chronic kidney disease, with long-term current use of insulin (HCC)   Sepsis (HCC)  Assessment and Plan: Sepsis: likely secondary to UTI. See Dr. Chrys Racer note on how he met criteria. Continue on IV merrem w/ recent ESBL. D/c IVFs. Pt left AMA on 08/04/23  UTI: urine cx shows insignificant growth. Continue on IV merrem x 5 days while inpatient and d/c on po abxs. Repeat urine cx would not likely grow anything as pt has been on IV abxs   Acute metabolic encephalopathy: likely secondary to above. Continue on IV abxs.  Improving    DM2: well controlled, HbA1c 6.4. 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 Avoid nephrotoxic meds   Generalized weakness: PT does not rec any f/u   Hyperkalemia: resolved    Hx of CAD: continue on statin, aspirin. Holding entresto, coreg, torsemide in setting of sepsis    GERD: continue on PPI     PAF: continue on amio, xarelto. Holding coreg in setting of sepsis   HLD: continue on statin    HTN: holding coreg, torsemide, entresto in setting of sepsis   Obesity: BMI 30.5. Would benefit from weight loss   Homeless:  pt does not want to go to a homeless shelter or a group home (group home will take his check & pt does not want to give up his check.) Pt wants someone to find him a room at a house and CM is unable to do this. Pt plans to appeal the d/c.  DVT prophylaxis: xarelto  Code Status: full  Family Communication:  Disposition Plan: d/c back outside as pt is homeless, see above   Level of care: Telemetry Medical  Status is: Inpatient Remains inpatient appropriate because: severity of illness, will need 1 day more of IV abxs and can be d/c  tomorrow if pt remains stable. Pt plans on appealing the d/c     Consultants:    Procedures:   Antimicrobials: merrem  Subjective: Pt c/o not sleeping well   Objective: Vitals:   08/08/23 1553 08/08/23 1938 08/08/23 2050 08/09/23 0400  BP: (!) 99/57 (!) 85/52 (!) 119/97 (!) 90/48  Pulse: 97 (!) 52 91 (!) 126  Resp: 16 17  20   Temp: 98 F (36.7 C) 98 F (36.7 C)  98.8 F (37.1 C)  TempSrc: Oral Oral    SpO2: 100% 92% 100% 99%  Weight:      Height:        Intake/Output Summary (Last 24 hours) at 08/09/2023 0848 Last data filed at 08/09/2023 0406 Gross per 24 hour  Intake 1640 ml  Output 3825 ml  Net -2185 ml   Filed Weights   08/07/23 0500  Weight: 99.3 kg    Examination:  General exam: appears calm & comfortable. Poor dentition  Respiratory system: clear breath sounds b/l Cardiovascular system: S1 & S2+. No rubs or gallops  Gastrointestinal system: abd is soft, NT, obese, normal bowel sounds  Central nervous system: Alert & oriented. Moves all extremities  Psychiatry: Judgement and insight is poor. Flat mood and affect    Data Reviewed: I have personally reviewed following labs and imaging studies  CBC: Recent Labs  Lab 08/04/23 0421 08/06/23 1941 08/07/23 0647 08/08/23 1014 08/09/23 0523  WBC 18.0* 13.5* 10.6* 10.0 8.8  HGB 11.4* 11.2* 10.9* 10.7* 9.9*  HCT 33.4* 34.6* 33.6* 31.7* 29.7*  MCV 91.5 96.4 96.3 94.3 93.1  PLT 757* 610* 541* 467* 489*   Basic Metabolic Panel: Recent Labs  Lab 08/03/23 0444 08/03/23 1557 08/04/23 0421 08/06/23 1941 08/07/23 0647 08/08/23 1014 08/09/23 0523  NA 134*  --  138 137 140 137 137  K 5.2* 4.9 4.4 5.4* 4.8 5.4* 4.9  CL 108  --  106 108 112* 106 107  CO2 17*  --  22 18* 19* 22 24  GLUCOSE 196*  --  270* 125* 166* 174* 147*  BUN 74*  --  60* 38* 31* 31* 27*  CREATININE 2.22*  --  2.01* 1.47* 1.44* 1.58* 1.67*  CALCIUM 8.7*  --  8.9 9.1 8.7* 8.2* 8.6*  MG 2.7* 2.7* 2.6*  --   --   --   --   PHOS 4.1   --  3.1  --   --   --   --    GFR: Estimated Creatinine Clearance: 52.3 mL/min (A) (by C-G formula based on SCr of 1.67 mg/dL (H)). Liver Function Tests: Recent Labs  Lab 08/03/23 0444 08/04/23 0421 08/06/23 1941  AST  --   --  20  ALT  --   --  20  ALKPHOS  --   --  66  BILITOT  --   --  0.7  PROT  --   --  7.2  ALBUMIN 2.2* 2.4* 3.0*   No results for input(s): "LIPASE", "AMYLASE" in the last 168 hours. No results for input(s): "AMMONIA" in the last 168 hours. Coagulation Profile: Recent Labs  Lab 08/07/23 0647  INR 1.2   Cardiac Enzymes: No results for input(s): "CKTOTAL", "CKMB", "CKMBINDEX", "TROPONINI" in the last 168 hours. BNP (last 3 results) No results for input(s): "PROBNP" in the last 8760 hours. HbA1C: No results for input(s): "HGBA1C" in the last 72 hours. CBG: Recent Labs  Lab 08/03/23 1932 08/03/23 2331 08/04/23 0259 08/04/23 0803 08/04/23 1129  GLUCAP 240* 223* 209* 241* 228*   Lipid Profile: No results for input(s): "CHOL", "HDL", "LDLCALC", "TRIG", "CHOLHDL", "LDLDIRECT" in the last 72 hours. Thyroid Function Tests: No results for input(s): "TSH", "T4TOTAL", "FREET4", "T3FREE", "THYROIDAB" in the last 72 hours. Anemia Panel: No results for input(s): "VITAMINB12", "FOLATE", "FERRITIN", "TIBC", "IRON", "RETICCTPCT" in the last 72 hours. Sepsis Labs: Recent Labs  Lab 08/06/23 2048 08/07/23 0005  LATICACIDVEN 2.6* 2.2*    Recent Results (from the past 240 hours)  Culture, blood (Routine x 2)     Status: Abnormal   Collection Time: 07/31/23 11:29 AM   Specimen: BLOOD  Result Value Ref Range Status   Specimen Description   Final    BLOOD RIGHT ANTECUBITAL Performed at [facility], [address] [place], [place] [address]    Special Requests   Final    BOTTLES DRAWN AEROBIC AND ANAEROBIC Blood Culture results may not be optimal due to an inadequate volume of blood received in culture bottles Performed at [facility],  [address], [place], [place] [address]    Culture  Setup Time   Final    GRAM NEGATIVE RODS ANAEROBIC BOTTLE ONLY Organism ID to follow CRITICAL RESULT CALLED TO, READ BACK BY AND VERIFIED WITH: WALID NAZARI @ 0981 08/01/23 LFD    Culture (A)  Final    ESCHERICHIA COLI Confirmed Extended Spectrum Beta-Lactamase Producer (ESBL).  In bloodstream infections from ESBL organisms, carbapenems are preferred over piperacillin/tazobactam. They are shown to have a lower risk of mortality.    Report Status 08/03/2023 FINAL  Final   Organism ID, Bacteria ESCHERICHIA COLI  Final      Susceptibility   Escherichia coli - MIC*    AMPICILLIN >=32 RESISTANT Resistant     CEFEPIME >=32 RESISTANT Resistant     CEFTAZIDIME RESISTANT Resistant     CEFTRIAXONE >=64 RESISTANT Resistant     CIPROFLOXACIN >=4 RESISTANT Resistant     GENTAMICIN <=1 SENSITIVE Sensitive     IMIPENEM <=0.25 SENSITIVE Sensitive     TRIMETH/SULFA <=20 SENSITIVE Sensitive     AMPICILLIN/SULBACTAM >=32 RESISTANT Resistant     PIP/TAZO 8 SENSITIVE Sensitive ug/mL    * ESCHERICHIA COLI  Blood Culture ID Panel (Reflexed)     Status: Abnormal   Collection Time: 07/31/23 11:29 AM  Result Value Ref Range Status   Enterococcus faecalis NOT DETECTED NOT DETECTED Final   Enterococcus Faecium NOT DETECTED NOT DETECTED Final   Listeria monocytogenes NOT DETECTED NOT DETECTED Final   Staphylococcus species NOT DETECTED NOT DETECTED Final   Staphylococcus aureus (BCID) NOT DETECTED NOT DETECTED Final   Staphylococcus epidermidis NOT DETECTED NOT DETECTED Final   Staphylococcus lugdunensis NOT DETECTED NOT DETECTED Final   Streptococcus species NOT DETECTED NOT DETECTED Final   Streptococcus agalactiae NOT DETECTED NOT DETECTED Final   Streptococcus pneumoniae NOT DETECTED NOT DETECTED  Final   Streptococcus pyogenes NOT DETECTED NOT DETECTED Final   A.calcoaceticus-baumannii NOT DETECTED NOT DETECTED Final   Bacteroides fragilis NOT  DETECTED NOT DETECTED Final   Enterobacterales DETECTED (A) NOT DETECTED Final    Comment: Enterobacterales represent a large order of gram negative bacteria, not a single organism. CRITICAL RESULT CALLED TO, READ BACK BY AND VERIFIED WITH: WALID NAZARI @ 7829 08/01/23 LFD    Enterobacter cloacae complex NOT DETECTED NOT DETECTED Final   Escherichia coli DETECTED (A) NOT DETECTED Final    Comment: CRITICAL RESULT CALLED TO, READ BACK BY AND VERIFIED WITH: WALID NAZARI @ 0042 08/01/23 LFD    Klebsiella aerogenes NOT DETECTED NOT DETECTED Final   Klebsiella oxytoca NOT DETECTED NOT DETECTED Final   Klebsiella pneumoniae NOT DETECTED NOT DETECTED Final   Proteus species NOT DETECTED NOT DETECTED Final   Salmonella species NOT DETECTED NOT DETECTED Final   Serratia marcescens NOT DETECTED NOT DETECTED Final   Haemophilus influenzae NOT DETECTED NOT DETECTED Final   Neisseria meningitidis NOT DETECTED NOT DETECTED Final   Pseudomonas aeruginosa NOT DETECTED NOT DETECTED Final   Stenotrophomonas maltophilia NOT DETECTED NOT DETECTED Final   Candida albicans NOT DETECTED NOT DETECTED Final   Candida auris NOT DETECTED NOT DETECTED Final   Candida glabrata NOT DETECTED NOT DETECTED Final   Candida krusei NOT DETECTED NOT DETECTED Final   Candida parapsilosis NOT DETECTED NOT DETECTED Final   Candida tropicalis NOT DETECTED NOT DETECTED Final   Cryptococcus neoformans/gattii NOT DETECTED NOT DETECTED Final   CTX-M ESBL DETECTED (A) NOT DETECTED Final    Comment: CRITICAL RESULT CALLED TO, READ BACK BY AND VERIFIED WITH: WALID NAZARI @0042  08/01/23 LFD (NOTE) Extended spectrum beta-lactamase detected. Recommend a carbapenem as initial therapy.      Carbapenem resistance IMP NOT DETECTED NOT DETECTED Final   Carbapenem resistance KPC NOT DETECTED NOT DETECTED Final   Carbapenem resistance NDM NOT DETECTED NOT DETECTED Final   Carbapenem resist OXA 48 LIKE NOT DETECTED NOT DETECTED Final    Carbapenem resistance VIM NOT DETECTED NOT DETECTED Final    Comment: Performed at [facility], [address] [place], [place] [address]  Culture, blood (Routine x 2)     Status: None   Collection Time: 07/31/23 11:36 AM   Specimen: BLOOD  Result Value Ref Range Status   Specimen Description BLOOD LEFT ANTECUBITAL  Final   Special Requests   Final    BOTTLES DRAWN AEROBIC AND ANAEROBIC Blood Culture results may not be optimal due to an inadequate volume of blood received in culture bottles   Culture   Final    NO GROWTH 5 DAYS Performed at [facility], [address] [place], [place] [address]    Report Status 08/05/2023 FINAL  Final  Resp panel by RT-PCR (RSV, Flu A&B, Covid) Anterior Nasal Swab     Status: None   Collection Time: 07/31/23 12:50 PM   Specimen: Anterior Nasal Swab  Result Value Ref Range Status   SARS Coronavirus 2 by RT PCR NEGATIVE NEGATIVE Final    Comment: (NOTE) SARS-CoV-2 target nucleic acids are NOT DETECTED.  The SARS-CoV-2 RNA is generally detectable in upper respiratory specimens during the acute phase of infection. The lowest concentration of SARS-CoV-2 viral copies this assay can detect is 138 copies/mL. A negative result does not preclude SARS-Cov-2 infection and should not be used as the sole basis for treatment or other patient management decisions. A negative result may occur with  improper specimen collection/handling, submission of specimen other than nasopharyngeal swab, presence of viral mutation(s) within the areas targeted by this assay, and inadequate number of viral copies(<138 copies/mL). A negative result must be combined with clinical observations, patient history, and epidemiological information. The expected result is Negative.  Fact Sheet for Patients:  BloggerCourse.com  Fact Sheet for Healthcare Providers:  SeriousBroker.it  This test is no t yet  approved or cleared by the [place] FDA and  has been authorized for detection and/or diagnosis of SARS-CoV-2 by FDA under an Emergency Use Authorization (EUA). This EUA will remain  in effect (meaning this test can be used) for the duration of the COVID-19 declaration under Section 564(b)(1) of the Act, 21 U.S.C.section 360bbb-3(b)(1), unless the authorization is terminated  or revoked sooner.       Influenza A by PCR NEGATIVE NEGATIVE Final   Influenza B by PCR NEGATIVE NEGATIVE Final    Comment: (NOTE) The Xpert Xpress SARS-CoV-2/FLU/RSV plus assay is intended as an aid in the diagnosis of influenza from Nasopharyngeal swab specimens and should not be used as a sole basis for treatment. Nasal washings and aspirates are unacceptable for Xpert Xpress SARS-CoV-2/FLU/RSV testing.  Fact Sheet for Patients: BloggerCourse.com  Fact Sheet for Healthcare Providers: SeriousBroker.it  This test is not yet approved or cleared by the [place] FDA and has been authorized for detection and/or diagnosis of SARS-CoV-2 by FDA under an Emergency Use Authorization (EUA). This EUA will remain in effect (meaning this test can be used) for the duration of the COVID-19 declaration under Section 564(b)(1) of the Act, 21 U.S.C. section 360bbb-3(b)(1), unless the authorization is terminated or revoked.     Resp Syncytial Virus by PCR NEGATIVE NEGATIVE Final    Comment: (NOTE) Fact Sheet for Patients: BloggerCourse.com  Fact Sheet for Healthcare Providers: SeriousBroker.it  This test is not yet approved or cleared by the [place] FDA and has been authorized for detection and/or diagnosis of SARS-CoV-2 by FDA under an Emergency Use Authorization (EUA). This EUA will remain in effect (meaning this test can be used) for the duration of the COVID-19 declaration under Section 564(b)(1) of  the Act, 21 U.S.C. section 360bbb-3(b)(1), unless the authorization is terminated or revoked.  Performed at [facility], [address] [place], [place] [address]   Urine Culture     Status: Abnormal   Collection Time: 07/31/23  3:53 PM   Specimen: Urine, Random  Result Value Ref Range Status   Specimen Description   Final    URINE, RANDOM Performed at [facility], [address] [place], [place] [address]    Special Requests   Final    NONE Reflexed from [id number] Performed at [facility], [address] [place], [place] [address]    Culture >=100,000 COLONIES/mL ESCHERICHIA COLI (A)  Final   Report Status 08/02/2023 FINAL  Final   Organism ID, Bacteria ESCHERICHIA COLI (A)  Final      Susceptibility   Escherichia coli - MIC*    AMPICILLIN >=32 RESISTANT Resistant     CEFAZOLIN >=64 RESISTANT Resistant     CEFEPIME 16 RESISTANT Resistant     CEFTRIAXONE >=64 RESISTANT Resistant     CIPROFLOXACIN >=4 RESISTANT Resistant     GENTAMICIN <=1 SENSITIVE Sensitive     IMIPENEM <=0.25 SENSITIVE Sensitive     NITROFURANTOIN 128 RESISTANT Resistant     TRIMETH/SULFA <=20 SENSITIVE Sensitive     AMPICILLIN/SULBACTAM >=32 RESISTANT Resistant  PIP/TAZO 8 SENSITIVE Sensitive ug/mL    * >=100,000 COLONIES/mL ESCHERICHIA COLI  MRSA Next Gen by PCR, Nasal     Status: None   Collection Time: 07/31/23  5:06 PM  Result Value Ref Range Status   MRSA by PCR Next Gen NOT DETECTED NOT DETECTED Final    Comment: (NOTE) The GeneXpert MRSA Assay (FDA approved for NASAL specimens only), is one component of a comprehensive MRSA colonization surveillance program. It is not intended to diagnose MRSA infection nor to guide or monitor treatment for MRSA infections. Test performance is not FDA approved in patients less than 41 years old. Performed at [facility] [place] [facility], [address] [place], [place] [address]   Respiratory (~20 pathogens) panel by PCR      Status: None   Collection Time: 08/01/23  8:11 AM  Result Value Ref Range Status   Adenovirus NOT DETECTED NOT DETECTED Final   Coronavirus 229E NOT DETECTED NOT DETECTED Final    Comment: (NOTE) The Coronavirus on the Respiratory Panel, DOES NOT test for the novel  Coronavirus (2019 nCoV)    Coronavirus HKU1 NOT DETECTED NOT DETECTED Final   Coronavirus NL63 NOT DETECTED NOT DETECTED Final   Coronavirus OC43 NOT DETECTED NOT DETECTED Final   Metapneumovirus NOT DETECTED NOT DETECTED Final   Rhinovirus / Enterovirus NOT DETECTED NOT DETECTED Final   Influenza A NOT DETECTED NOT DETECTED Final   Influenza B NOT DETECTED NOT DETECTED Final   Parainfluenza Virus 1 NOT DETECTED NOT DETECTED Final   Parainfluenza Virus 2 NOT DETECTED NOT DETECTED Final   Parainfluenza Virus 3 NOT DETECTED NOT DETECTED Final   Parainfluenza Virus 4 NOT DETECTED NOT DETECTED Final   Respiratory Syncytial Virus NOT DETECTED NOT DETECTED Final   Bordetella pertussis NOT DETECTED NOT DETECTED Final   Bordetella Parapertussis NOT DETECTED NOT DETECTED Final   Chlamydophila pneumoniae NOT DETECTED NOT DETECTED Final   Mycoplasma pneumoniae NOT DETECTED NOT DETECTED Final    Comment: Performed at [facility], [address], [place], [place] [address]  Resp panel by RT-PCR (RSV, Flu A&B, Covid) Anterior Nasal Swab     Status: None   Collection Time: 08/06/23  8:48 PM   Specimen: Anterior Nasal Swab  Result Value Ref Range Status   SARS Coronavirus 2 by RT PCR NEGATIVE NEGATIVE Final    Comment: (NOTE) SARS-CoV-2 target nucleic acids are NOT DETECTED.  The SARS-CoV-2 RNA is generally detectable in upper respiratory specimens during the acute phase of infection. The lowest concentration of SARS-CoV-2 viral copies this assay can detect is 138 copies/mL. A negative result does not preclude SARS-Cov-2 infection and should not be used as the sole basis for treatment or other patient management decisions. A  negative result may occur with  improper specimen collection/handling, submission of specimen other than nasopharyngeal swab, presence of viral mutation(s) within the areas targeted by this assay, and inadequate number of viral copies(<138 copies/mL). A negative result must be combined with clinical observations, patient history, and epidemiological information. The expected result is Negative.  Fact Sheet for Patients:  BloggerCourse.com  Fact Sheet for Healthcare Providers:  SeriousBroker.it  This test is no t yet approved or cleared by the [place] FDA and  has been authorized for detection and/or diagnosis of SARS-CoV-2 by FDA under an Emergency Use Authorization (EUA). This EUA will remain  in effect (meaning this test can be used) for the duration of the COVID-19 declaration under Section 564(b)(1) of the Act, 21  U.S.C.section 360bbb-3(b)(1), unless the authorization is terminated  or revoked sooner.       Influenza A by PCR NEGATIVE NEGATIVE Final   Influenza B by PCR NEGATIVE NEGATIVE Final    Comment: (NOTE) The Xpert Xpress SARS-CoV-2/FLU/RSV plus assay is intended as an aid in the diagnosis of influenza from Nasopharyngeal swab specimens and should not be used as a sole basis for treatment. Nasal washings and aspirates are unacceptable for Xpert Xpress SARS-CoV-2/FLU/RSV testing.  Fact Sheet for Patients: BloggerCourse.com  Fact Sheet for Healthcare Providers: SeriousBroker.it  This test is not yet approved or cleared by the [place] FDA and has been authorized for detection and/or diagnosis of SARS-CoV-2 by FDA under an Emergency Use Authorization (EUA). This EUA will remain in effect (meaning this test can be used) for the duration of the COVID-19 declaration under Section 564(b)(1) of the Act, 21 U.S.C. section 360bbb-3(b)(1), unless the authorization  is terminated or revoked.     Resp Syncytial Virus by PCR NEGATIVE NEGATIVE Final    Comment: (NOTE) Fact Sheet for Patients: BloggerCourse.com  Fact Sheet for Healthcare Providers: SeriousBroker.it  This test is not yet approved or cleared by the [place] FDA and has been authorized for detection and/or diagnosis of SARS-CoV-2 by FDA under an Emergency Use Authorization (EUA). This EUA will remain in effect (meaning this test can be used) for the duration of the COVID-19 declaration under Section 564(b)(1) of the Act, 21 U.S.C. section 360bbb-3(b)(1), unless the authorization is terminated or revoked.  Performed at [facility], [address] [place], [place] [address]   Blood Culture (routine x 2)     Status: None (Preliminary result)   Collection Time: 08/06/23  8:48 PM   Specimen: BLOOD  Result Value Ref Range Status   Specimen Description BLOOD LFA  Final   Special Requests   Final    BOTTLES DRAWN AEROBIC AND ANAEROBIC Blood Culture results may not be optimal due to an inadequate volume of blood received in culture bottles   Culture   Final    NO GROWTH 3 DAYS Performed at [facility], [address] [place], [place] [address]    Report Status PENDING  Incomplete  Blood Culture (routine x 2)     Status: None (Preliminary result)   Collection Time: 08/06/23  8:48 PM   Specimen: BLOOD  Result Value Ref Range Status   Specimen Description BLOOD RFA  Final   Special Requests   Final    BOTTLES DRAWN AEROBIC AND ANAEROBIC Blood Culture results may not be optimal due to an inadequate volume of blood received in culture bottles   Culture   Final    NO GROWTH 3 DAYS Performed at [facility], [address], [place], [place] [address]    Report Status PENDING  Incomplete  Urine Culture     Status: Abnormal   Collection Time: 08/06/23  8:48 PM   Specimen: Urine, Random  Result Value  Ref Range Status   Specimen Description   Final    URINE, RANDOM Performed at [facility], [address] [place], [place] [address]    Special Requests   Final    NONE Reflexed from [id number] Performed at [facility], [address] [place], [place] [address]    Culture (A)  Final    <10,000 COLONIES/mL INSIGNIFICANT GROWTH Performed at [facility], [address] [place], [place] [address]    Report Status 08/08/2023 FINAL  Final         Radiology Studies: No results found.       Scheduled Meds:  amiodarone  200 mg Oral Daily   aspirin  81 mg Oral Daily   atorvastatin  40 mg Oral Daily   pantoprazole  20 mg Oral Daily   rivaroxaban  20 mg Oral Q supper   Continuous Infusions:  meropenem (MERREM) IV 1 g (08/09/23 0635)     LOS: 2 days        Charise Killian, MD [facility] Pager [phone]  If 7PM-7AM, please contact night-coverage 08/09/2023, 8:48 AM

## 2023-08-09 NOTE — TOC Initial Note (Signed)
 Transition of Care [facility]) - Initial/Assessment Note    Patient Details  Name: Jeremy [name] MRN: [id number] Date of Birth: Apr 22, 1956  Transition of Care [facility]) CM/SW Contact:    Chapman Fitch, RN Phone Number: 08/09/2023, 2:42 PM  Clinical Narrative:                  Met with patient at bedside He state he has been sleeping "in the grass in [place]" He states that he previously did not try to go to the homeless shelter "because I know they won't let me in" Patient request that I find "him a room to rent" I let him know that if he is interested in a group home environment I could assist in seeing is that is an option.   He declines and states "I have to be over my own money, no one is taking my check" Patient states that he has already put $400 to hold a room at a house, but is not able to tell me where the house is located at.  Homeless shelter resources listed on AVS.  Patient declines to go to homeless shelter at discharge  Food, transportation, housing, and utility resources added to AVS         Patient Goals and CMS Choice            Expected Discharge Plan and Services                                              Prior Living Arrangements/Services                       Activities of Daily Living   ADL Screening (condition at time of admission) Independently performs ADLs?: Yes (appropriate for developmental age) Is the patient deaf or have difficulty hearing?: No Does the patient have difficulty seeing, even when wearing glasses/contacts?: No Does the patient have difficulty concentrating, remembering, or making decisions?: Yes  Permission Sought/Granted                  Emotional Assessment              Admission diagnosis:  Palpitations [R00.2] Acute metabolic encephalopathy [G93.41] UTI due to extended-spectrum beta lactamase (ESBL) producing Escherichia coli [N39.0, B96.29, Z16.12] Sepsis (HCC) [A41.9] Patient  Active Problem List   Diagnosis Date Noted   Acute metabolic encephalopathy 08/07/2023   Sepsis due to gram-negative UTI (HCC) 08/07/2023   Dyslipidemia 08/07/2023   Paroxysmal atrial fibrillation (HCC) 08/07/2023   GERD without esophagitis 08/07/2023   Coronary artery disease 08/07/2023   Type 2 diabetes mellitus with chronic kidney disease, with long-term current use of insulin (HCC) 08/07/2023   Sepsis (HCC) 08/07/2023   Septic shock (HCC) 07/31/2023   COPD with acute exacerbation (HCC) 06/26/2023   Afib (HCC) 06/26/2023   Adjustment disorder with depressed mood 03/14/2023   Acute ST elevation myocardial infarction (STEMI) of posterior wall (HCC) 11/24/2022   HFrEF (heart failure with reduced ejection fraction) (HCC) 11/24/2022   PAF (paroxysmal atrial fibrillation) (HCC) 11/24/2022   OSA (obstructive sleep apnea) 11/24/2022   Coronary artery disease involving native coronary artery of native heart 11/24/2022   Polysubstance abuse (HCC) 11/24/2022   Hyperlipidemia 11/24/2022   Aortic atherosclerosis (HCC) 07/16/2021   Obesity (BMI 30-39.9) 07/16/2021   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/2021   Stage  3 chronic kidney disease (HCC) 07/16/2021   On mechanically assisted ventilation (HCC) 12/28/2020   Atrial fibrillation with RVR (HCC) 12/28/2020   Acute delirium 12/28/2020   Tobacco abuse 12/28/2020   Diabetes mellitus, type 2 (HCC) 12/28/2020   NSTEMI (non-ST elevated myocardial infarction) (HCC)    Acute respiratory failure with hypoxia (HCC)    Acute pulmonary embolism (HCC) 12/22/2020   History of cocaine use 11/26/2019   History of marijuana use 11/26/2019   History of illicit drug use 11/26/2019   Pharmacologic therapy 11/26/2019   Acute pain (now resolved) 11/26/2019   MRSA (methicillin resistant Staphylococcus aureus) infection 10/18/2019   Hyperosmolar non-ketotic state due to type 2 diabetes mellitus (HCC) 08/21/2019   Hyponatremia 08/19/2019   AKI (acute kidney injury) (HCC)  08/19/2019   Essential hypertension 08/19/2019   Medically noncompliant 08/19/2019   Post-traumatic osteoarthritis of right ankle 10/23/2018   Skin ulcer of right ankle with fat layer exposed (HCC) 10/23/2018   Status post ORIF of fracture of ankle 10/23/2018   Diabetic foot infection (HCC) 10/22/2018   Pressure injury of skin 02/11/2018   PCP:  Barbette Reichmann, MD Pharmacy:   [facility] [address] [place] - [address] [place] [address] Phone: [phone] Fax: [phone]  [facility] [address] [place] [address] Phone: [phone] Fax: [phone]     Social Drivers of Health (SDOH) Social History: SDOH Screenings   Food Insecurity: Patient Unable To Answer (08/07/2023)  Housing: Patient Unable To Answer (08/07/2023)  Recent Concern: Housing - High Risk (06/17/2023)   Received from [name]  Transportation Needs: Patient Unable To Answer (08/07/2023)  Utilities: Patient Unable To Answer (08/07/2023)  Depression (PHQ2-9): Low Risk  (10/30/2018)  Financial Resource Strain: Low Risk  (06/03/2023)   Received from [facility]  Recent Concern: Financial Resource Strain - High Risk (03/22/2023)   Received from [facility]  Social Connections: Patient Unable To Answer (08/07/2023)  Tobacco Use: High Risk (08/06/2023)   SDOH Interventions:     Readmission Risk Interventions    08/09/2023    2:42 PM  Readmission Risk Prevention Plan  Transportation Screening Complete  HRI or Home Care Consult --  Palliative Care Screening Not Applicable  Skilled Nursing Facility Not Applicable

## 2023-08-09 NOTE — Progress Notes (Signed)
 Physical Therapy Treatment Patient Details Name: Jeremy [name] MRN: [id number] DOB: Oct 17, 1955 Today's Date: 08/09/2023   History of Present Illness Pt is a 68 y.o. male presenting to hospital 08/06/23 with c/o palpitations; per chart pt recently left AMA (pt hospitalized with septic shock secondary to ESBL E. Coli UTI with an acute metabolic encephalopathy).  Pt admitted with sepsis d/t gram-negative UTI, acute metabolic encephalopathy, DM, CAD.  PMH includes CHF, CAD, s/p CABG, DM type 2, htn, pulmonary thrombectomy 2022.    PT Comments  OOB with no assist.  Is able to stand and walk 120' with RW and cga x 2 for safety.  Gait is improved with RW today.  He does c/o some light dizziness but stated "I'm going to do it anyways" with no increase in symptoms with time.  Generally impulsive at times but no LOB or buckling.  Max encouragement to stay up in chair and educated on increasing OOB time.   If plan is discharge home, recommend the following: A little help with walking and/or transfers;A little help with bathing/dressing/bathroom;Assist for transportation;Help with stairs or ramp for entrance   Can travel by private vehicle        Equipment Recommendations  Rolling walker (2 wheels)    Recommendations for Other Services       Precautions / Restrictions Precautions Precautions: Fall Recall of Precautions/Restrictions: Intact Restrictions Weight Bearing Restrictions Per Provider Order: No     Mobility  Bed Mobility Overal bed mobility: Modified Independent               Patient Response: Cooperative, Impulsive  Transfers Overall transfer level: Needs assistance Equipment used: Rolling walker (2 wheels) Transfers: Sit to/from Stand Sit to Stand: Supervision                Ambulation/Gait Ambulation/Gait assistance: Contact guard assist Gait Distance (Feet): 120 Feet Assistive device: Rolling walker (2 wheels) Gait Pattern/deviations: Step-through  pattern Gait velocity: decreased     General Gait Details: R LE externally rotated; increased R lateral sway; increased R LE stance time   Stairs             Wheelchair Mobility     Tilt Bed Tilt Bed Patient Response: Cooperative, Impulsive  Modified Rankin (Stroke Patients Only)       Balance Overall balance assessment: Needs assistance Sitting-balance support: No upper extremity supported, Feet supported Sitting balance-Leahy Scale: Good     Standing balance support: Bilateral upper extremity supported Standing balance-Leahy Scale: Fair Standing balance comment: improved with RW                            Communication    Cognition Arousal: Alert Behavior During Therapy: Impulsive   PT - Cognitive impairments: No apparent impairments                                Cueing    Exercises      General Comments        Pertinent Vitals/Pain Pain Assessment Pain Assessment: No/denies pain    Home Living                          Prior Function            PT Goals (current goals can now be found in the care plan section) Progress towards PT goals:  Progressing toward goals    Frequency    Min 1X/week      PT Plan      Co-evaluation              AM-PAC PT "6 Clicks" Mobility   Outcome Measure  Help needed turning from your back to your side while in a flat bed without using bedrails?: None Help needed moving from lying on your back to sitting on the side of a flat bed without using bedrails?: None Help needed moving to and from a bed to a chair (including a wheelchair)?: A Little Help needed standing up from a chair using your arms (e.g., wheelchair or bedside chair)?: A Little Help needed to walk in hospital room?: A Little Help needed climbing 3-5 steps with a railing? : A Little 6 Click Score: 20    End of Session Equipment Utilized During Treatment: Gait belt Activity Tolerance: Patient  tolerated treatment well Patient left: in chair;with call bell/phone within reach;with chair alarm set Nurse Communication: Mobility status;Precautions;Other (comment) PT Visit Diagnosis: Muscle weakness (generalized) (M62.81);Difficulty in walking, not elsewhere classified (R26.2)     Time: 8295-6213 PT Time Calculation (min) (ACUTE ONLY): 18 min  Charges:    $Gait Training: 8-22 mins PT General Charges $$ ACUTE PT VISIT: 1 Visit                   Danielle Dess, PTA 08/09/23, 12:14 PM

## 2023-08-09 NOTE — Progress Notes (Signed)
   08/09/23 1500  Spiritual Encounters  Type of Visit Initial  Care provided to: Patient  Conversation partners present during encounter Nurse  Referral source Nurse (RN/NT/LPN)  Reason for visit Grief/loss  OnCall Visit No  Spiritual Framework  Presenting Themes Meaning/purpose/sources of inspiration;Impactful experiences and emotions;Courage hope and growth  Patient Stress Factors Family relationships  Interventions  Spiritual Care Interventions Made Established relationship of care and support;Compassionate presence;Reflective listening;Prayer;Encouragement  Intervention Outcomes  Outcomes Awareness of support;Reduced anxiety;Connection to spiritual care;Awareness around self/spiritual resourses  Spiritual Care Plan  Spiritual Care Issues Still Outstanding Chaplain will continue to follow   Chaplain received a spiritual consult to check on the patient because he just lost a loved one. Chaplain met with the patient and he shared memories about his loved one and prayed with the chaplain. Patient was very emotional and cried throughout the visit. Chaplain told him and his nurse that I will follow up with the patient tomorrow. Chaplain services remain available for spiritual and emotional support.

## 2023-08-09 NOTE — Progress Notes (Signed)
 Occupational Therapy Treatment Patient Details Name: Jeremy [name] MRN: [id number] DOB: 05/04/1956 Today's Date: 08/09/2023   History of present illness Pt is a 68 y.o. male presenting to hospital 08/06/23 with c/o palpitations; per chart pt recently left AMA (pt hospitalized with septic shock secondary to ESBL E. Coli UTI with an acute metabolic encephalopathy).  Pt admitted with sepsis d/t gram-negative UTI, acute metabolic encephalopathy, DM, CAD.  PMH includes CHF, CAD, s/p CABG, DM type 2, htn, pulmonary thrombectomy 2022.   OT comments  Pt seen for OT tx, as pt waves OT into his room asking, "where have you been?" Pt denies physical pain but endorses his heart hurts when referencing his ex girlfriend who he still loves. Intermittently labile crying to the point of producing moderate amounts of mouth and nose secretions running down his face and very loudly expressing his upset. HR in 120's at rest when visibly upset. Emotional support and active listening provided, redirection as tolerated, educated in PLB. Pt tolerated EOB grooming and standing to use urinal without direct assist.  Pt continues to benefit from skilled OT services.        If plan is discharge home, recommend the following:  A little help with walking and/or transfers;A little help with bathing/dressing/bathroom;Help with stairs or ramp for entrance   Equipment Recommendations  BSC/3in1;Other (comment) (2ww)    Recommendations for Other Services      Precautions / Restrictions Precautions Precautions: Fall Recall of Precautions/Restrictions: Intact Restrictions Weight Bearing Restrictions Per Provider Order: No       Mobility Bed Mobility Overal bed mobility: Modified Independent Bed Mobility: Supine to Sit, Sit to Supine     Supine to sit: Modified independent (Device/Increase time) Sit to supine: Modified independent (Device/Increase time)        Transfers Overall transfer level: Needs  assistance Equipment used: None Transfers: Sit to/from Stand Sit to Stand: Supervision                 Balance Overall balance assessment: Mild deficits observed, not formally tested                                         ADL either performed or assessed with clinical judgement   ADL Overall ADL's : Needs assistance/impaired     Grooming: Sitting;Set up;Wash/dry face;Wash/dry hands;Oral care                       Toileting- Clothing Manipulation and Hygiene: Sit to/from stand;Supervision/safety;Contact guard assist Toileting - Clothing Manipulation Details (indicate cue type and reason): SBA while standing to use urinal, no LOB noted            Extremity/Trunk Assessment              Vision       Perception     Praxis     Communication Communication Communication: No apparent difficulties   Cognition Arousal: Alert Behavior During Therapy: Lability Cognition: No family/caregiver present to determine baseline, Cognition impaired             OT - Cognition Comments: Pt perseverating on ex girlfriend who he still loves and expresses being upset that others are influencing her opinions of him, tearful, requires MOD VC to redirect                 Following commands: Intact  Cueing   Cueing Techniques: Verbal cues, Visual cues  Exercises Other Exercises Other Exercises: Emotional support and active listening provided, redirection as tolerated, educated in PLB    Shoulder Instructions       General Comments      Pertinent Vitals/ Pain       Pain Assessment Pain Assessment: No/denies pain (pt denies physical pain but endorses his heart hurts when referencing his ex girlfriend who he still loves)  Home Living                                          Prior Functioning/Environment              Frequency  Min 1X/week        Progress Toward Goals  OT Goals(current goals can  now be found in the care plan section)  Progress towards OT goals: Progressing toward goals  Acute Rehab OT Goals Patient Stated Goal: get better and find a home OT Goal Formulation: With patient Time For Goal Achievement: 08/22/23 Potential to Achieve Goals: Good  Plan      Co-evaluation                 AM-PAC OT "6 Clicks" Daily Activity     Outcome Measure   Help from another person eating meals?: None Help from another person taking care of personal grooming?: None Help from another person toileting, which includes using toliet, bedpan, or urinal?: A Little Help from another person bathing (including washing, rinsing, drying)?: A Little Help from another person to put on and taking off regular upper body clothing?: None Help from another person to put on and taking off regular lower body clothing?: None 6 Click Score: 22    End of Session    OT Visit Diagnosis: Other abnormalities of gait and mobility (R26.89);Muscle weakness (generalized) (M62.81);Unsteadiness on feet (R26.81)   Activity Tolerance Other (comment) (pt emotionally upset)   Patient Left in bed;with call bell/phone within reach;with bed alarm set   Nurse Communication          Time: 1345-1405 OT Time Calculation (min): 20 min  Charges: OT General Charges $OT Visit: 1 Visit OT Treatments $Self Care/Home Management : 8-22 mins  Arman Filter., MPH, MS, OTR/L ascom [phone] 08/09/23, 3:07 PM

## 2023-08-10 DIAGNOSIS — A4150 Gram-negative sepsis, unspecified: Secondary | ICD-10-CM | POA: Diagnosis not present

## 2023-08-10 DIAGNOSIS — N390 Urinary tract infection, site not specified: Secondary | ICD-10-CM | POA: Diagnosis not present

## 2023-08-10 LAB — CBC
HCT: 31 % — ABNORMAL LOW (ref 39.0–52.0)
Hemoglobin: 10.4 g/dL — ABNORMAL LOW (ref 13.0–17.0)
MCH: 31.4 pg (ref 26.0–34.0)
MCHC: 33.5 g/dL (ref 30.0–36.0)
MCV: 93.7 fL (ref 80.0–100.0)
Platelets: 438 10*3/uL — ABNORMAL HIGH (ref 150–400)
RBC: 3.31 MIL/uL — ABNORMAL LOW (ref 4.22–5.81)
RDW: 15.1 % (ref 11.5–15.5)
WBC: 9.6 10*3/uL (ref 4.0–10.5)
nRBC: 0 % (ref 0.0–0.2)

## 2023-08-10 LAB — BASIC METABOLIC PANEL
Anion gap: 6 (ref 5–15)
BUN: 29 mg/dL — ABNORMAL HIGH (ref 8–23)
CO2: 23 mmol/L (ref 22–32)
Calcium: 9 mg/dL (ref 8.9–10.3)
Chloride: 107 mmol/L (ref 98–111)
Creatinine, Ser: 1.72 mg/dL — ABNORMAL HIGH (ref 0.61–1.24)
GFR, Estimated: 43 mL/min — ABNORMAL LOW (ref >60)
Glucose, Bld: 137 mg/dL — ABNORMAL HIGH (ref 70–99)
Potassium: 4.6 mmol/L (ref 3.5–5.1)
Sodium: 136 mmol/L (ref 135–145)

## 2023-08-10 NOTE — Care Management Important Message (Signed)
 Important Message  Patient Details  Name: Jeremy [name] MRN: [id number] Date of Birth: 28-Apr-1956   Important Message Given:  Yes - Medicare IM     Cristela Blue, CMA 08/10/2023, 10:32 AM

## 2023-08-10 NOTE — Progress Notes (Signed)
  PROGRESS NOTE    Jeremy [name]  ION:[id number] DOB: 1955-06-26 DOA: 08/06/2023 PCP: Barbette Reichmann, MD  [id number]  LOS: 3 days   Brief hospital course:   Assessment & Plan: Jeremy [name] is a 68 y.o. African-American male with medical history significant for CHF, coronary artery disease, s/p CABG, type 2 diabetes mellitus and hypertension who presented to the emergency room with acute onset of altered mental status with confusion and somnolence.   He denied any chest pain but admitted to palpitations. He presented with similar symptoms from his correctional facility on 2/16 and was admitted for septic shock due to UTI with ESBL E. coli at which time his blood cultures also grew ESBL E. coli.  The patient unfortunately left AMA on 2/20 and came back on 2/22 with complaint of "my heart is beating out of my chest" and of being hungry and cold. Pt is homeless, brought here by Emergency planning/management officer.    Sepsis:  --tachycardia and leukocytosis, source ESBL bacteremia and UTI  ESBL bacteremia ESBL UTI --pos blood cx and urine cx on 2/16, received 4 full days of IV meropenem before leaving AMA.  IV meropenem resumed on current presentation on 2/22. --repeat blood cx from 2/22 neg so far.  Repeat urine cx from 2/22 insignificant growth --cont IV meropenem   Acute metabolic encephalopathy: likely secondary to above.  DM2:  well controlled, HbA1c 6.4.  --no need for BG checks and SSI   Likely CKDIIIa: baseline Cr/GFR is unknown, currently stage IIIa. Avoid nephrotoxic meds    Generalized weakness:  PT does not rec any f/u    Hyperkalemia: resolved    Hx of CAD:  --cont ASA and statin   GERD: continue on PPI     PAF:  continue on amio, xarelto.  --hold coreg   HLD: continue on statin    HTN:  holding coreg, torsemide, entresto in setting of sepsis    Obesity: BMI 30.5.    DVT prophylaxis: [id number]  Code Status: Full code  Family Communication:  Level of care:  Telemetry Medical Dispo:   The patient is from: homeless Anticipated d/c is to: homeless Anticipated d/c date is: 1-2 days   Subjective and Interval History:  No new complaint.  Pt spent the whole time ranting about his ex-girlfriend.     Objective: Vitals:   08/10/23 0440 08/10/23 1057 08/10/23 1449 08/10/23 1918  BP: 102/73 (!) 129/58 130/72 114/70  Pulse: (!) 129   100  Resp: 20 18 18 18   Temp: 98.1 F (36.7 C) (!) 97.5 F (36.4 C) 98.4 F (36.9 C) 98.6 F (37 C)  TempSrc: Oral Oral  Oral  SpO2: 100% 99% 100% 100%  Weight:      Height:        Intake/Output Summary (Last 24 hours) at 08/10/2023 1922 Last data filed at 08/10/2023 0554 Gross per 24 hour  Intake 761.53 ml  Output 1225 ml  Net -463.47 ml   Filed Weights   08/07/23 0500  Weight: 99.3 kg    Examination:   Constitutional: NAD, AAOx3 HEENT: conjunctivae and lids normal, EOMI CV: No cyanosis.   RESP: normal respiratory effort, on RA Neuro: II - XII grossly intact.     Data Reviewed: I have personally reviewed labs and imaging studies  Time spent: 50 minutes  Darlin Priestly, MD [facility] If 7PM-7AM, please contact night-coverage 08/10/2023, 7:22 PM

## 2023-08-10 NOTE — Plan of Care (Signed)
  Problem: Respiratory: Goal: Ability to maintain adequate ventilation will improve Outcome: Progressing   Problem: Education: Goal: Knowledge of General Education information will improve Description: Including pain rating scale, medication(s)/side effects and non-pharmacologic comfort measures Outcome: Progressing   Problem: Activity: Goal: Risk for activity intolerance will decrease Outcome: Progressing   Problem: Nutrition: Goal: Adequate nutrition will be maintained Outcome: Progressing   Problem: Coping: Goal: Level of anxiety will decrease Outcome: Progressing

## 2023-08-10 NOTE — Progress Notes (Signed)
 Mobility Specialist - Progress Note   08/10/23 1500  Mobility  Activity Ambulated with assistance in hallway  Level of Assistance Standby assist, set-up cues, supervision of patient - no hands on  Assistive Device Front wheel walker  Distance Ambulated (ft) 75 ft  Activity Response Tolerated well  Mobility visit 1 Mobility     Pt lying in bed upon arrival, utilizing RA. Pt agreeable to activity. Completed bed mobility independently. STS and ambulation with supervision. No LOB. Pt does remove hands from RW. Dances in hallway. Impulsive. Pt wheeled back to room d/t fatigue. Pt left in chair with alarm set, needs in reach.    Filiberto Pinks Mobility Specialist 08/10/23, 3:40 PM

## 2023-08-11 ENCOUNTER — Other Ambulatory Visit: Payer: Self-pay

## 2023-08-11 ENCOUNTER — Encounter: Payer: Self-pay | Admitting: Emergency Medicine

## 2023-08-11 ENCOUNTER — Emergency Department
Admission: EM | Admit: 2023-08-11 | Discharge: 2023-08-11 | Disposition: A | Discharge status: Home / Self Care | Payer: 59 | Attending: Emergency Medicine | Admitting: Emergency Medicine

## 2023-08-11 DIAGNOSIS — I5020 Unspecified systolic (congestive) heart failure: Secondary | ICD-10-CM | POA: Diagnosis not present

## 2023-08-11 DIAGNOSIS — E1122 Type 2 diabetes mellitus with diabetic chronic kidney disease: Secondary | ICD-10-CM | POA: Insufficient documentation

## 2023-08-11 DIAGNOSIS — N183 Chronic kidney disease, stage 3 unspecified: Secondary | ICD-10-CM | POA: Diagnosis not present

## 2023-08-11 DIAGNOSIS — R002 Palpitations: Secondary | ICD-10-CM | POA: Insufficient documentation

## 2023-08-11 DIAGNOSIS — J449 Chronic obstructive pulmonary disease, unspecified: Secondary | ICD-10-CM | POA: Diagnosis not present

## 2023-08-11 DIAGNOSIS — A4150 Gram-negative sepsis, unspecified: Secondary | ICD-10-CM | POA: Diagnosis not present

## 2023-08-11 DIAGNOSIS — R42 Dizziness and giddiness: Secondary | ICD-10-CM | POA: Insufficient documentation

## 2023-08-11 DIAGNOSIS — Z7901 Long term (current) use of anticoagulants: Secondary | ICD-10-CM | POA: Insufficient documentation

## 2023-08-11 DIAGNOSIS — I130 Hypertensive heart and chronic kidney disease with heart failure and stage 1 through stage 4 chronic kidney disease, or unspecified chronic kidney disease: Secondary | ICD-10-CM | POA: Diagnosis not present

## 2023-08-11 DIAGNOSIS — R000 Tachycardia, unspecified: Secondary | ICD-10-CM | POA: Insufficient documentation

## 2023-08-11 DIAGNOSIS — N390 Urinary tract infection, site not specified: Secondary | ICD-10-CM | POA: Diagnosis not present

## 2023-08-11 LAB — CULTURE, BLOOD (ROUTINE X 2)
Culture: NO GROWTH
Culture: NO GROWTH

## 2023-08-11 LAB — CBC
HCT: 29.3 % — ABNORMAL LOW (ref 39.0–52.0)
HCT: 33.3 % — ABNORMAL LOW (ref 39.0–52.0)
Hemoglobin: 9.9 g/dL — ABNORMAL LOW (ref 13.0–17.0)
Hemoglobin: 10.9 g/dL — ABNORMAL LOW (ref 13.0–17.0)
MCH: 31.2 pg (ref 26.0–34.0)
MCH: 30.9 pg (ref 26.0–34.0)
MCHC: 33.8 g/dL (ref 30.0–36.0)
MCHC: 32.7 g/dL (ref 30.0–36.0)
MCV: 92.4 fL (ref 80.0–100.0)
MCV: 94.3 fL (ref 80.0–100.0)
Platelets: 396 10*3/uL (ref 150–400)
Platelets: 454 10*3/uL — ABNORMAL HIGH (ref 150–400)
RBC: 3.17 MIL/uL — ABNORMAL LOW (ref 4.22–5.81)
RBC: 3.53 MIL/uL — ABNORMAL LOW (ref 4.22–5.81)
RDW: 15.2 % (ref 11.5–15.5)
RDW: 15.3 % (ref 11.5–15.5)
WBC: 7.9 10*3/uL (ref 4.0–10.5)
WBC: 8.4 10*3/uL (ref 4.0–10.5)
nRBC: 0 % (ref 0.0–0.2)
nRBC: 0 % (ref 0.0–0.2)

## 2023-08-11 LAB — BASIC METABOLIC PANEL
Anion gap: 7 (ref 5–15)
Anion gap: 8 (ref 5–15)
BUN: 24 mg/dL — ABNORMAL HIGH (ref 8–23)
BUN: 25 mg/dL — ABNORMAL HIGH (ref 8–23)
CO2: 21 mmol/L — ABNORMAL LOW (ref 22–32)
CO2: 25 mmol/L (ref 22–32)
Calcium: 8.8 mg/dL — ABNORMAL LOW (ref 8.9–10.3)
Calcium: 9.2 mg/dL (ref 8.9–10.3)
Chloride: 106 mmol/L (ref 98–111)
Chloride: 105 mmol/L (ref 98–111)
Creatinine, Ser: 1.54 mg/dL — ABNORMAL HIGH (ref 0.61–1.24)
Creatinine, Ser: 1.59 mg/dL — ABNORMAL HIGH (ref 0.61–1.24)
GFR, Estimated: 49 mL/min — ABNORMAL LOW (ref >60)
GFR, Estimated: 48 mL/min — ABNORMAL LOW (ref >60)
Glucose, Bld: 122 mg/dL — ABNORMAL HIGH (ref 70–99)
Glucose, Bld: 136 mg/dL — ABNORMAL HIGH (ref 70–99)
Potassium: 4.5 mmol/L (ref 3.5–5.1)
Potassium: 5.3 mmol/L — ABNORMAL HIGH (ref 3.5–5.1)
Sodium: 134 mmol/L — ABNORMAL LOW (ref 135–145)
Sodium: 138 mmol/L (ref 135–145)

## 2023-08-11 LAB — MAGNESIUM: Magnesium: 2.1 mg/dL (ref 1.7–2.4)

## 2023-08-11 LAB — TROPONIN I (HIGH SENSITIVITY): Troponin I (High Sensitivity): 12 ng/L (ref <18)

## 2023-08-11 MED ORDER — SODIUM CHLORIDE 0.9 % IV BOLUS
1000.0000 mL | Freq: Once | INTRAVENOUS | Status: AC
  Administered 2023-08-11: 1000 mL via INTRAVENOUS

## 2023-08-11 MED ORDER — METOPROLOL TARTRATE 5 MG/5ML IV SOLN: 5.0000 mg | Freq: Once | INTRAVENOUS | Status: DC

## 2023-08-11 MED ORDER — RIVAROXABAN 20 MG PO TABS
20.0000 mg | ORAL_TABLET | Freq: Every day | ORAL | 0 refills | Status: DC
  Filled 2023-08-11: qty 90, 90d supply, fill #0

## 2023-08-11 MED ORDER — CARVEDILOL 3.125 MG PO TABS
3.1250 mg | ORAL_TABLET | Freq: Two times a day (BID) | ORAL | 0 refills | Status: DC
  Filled 2023-08-11: qty 180, 90d supply, fill #0

## 2023-08-11 MED ORDER — CARVEDILOL 6.25 MG PO TABS
3.1250 mg | ORAL_TABLET | Freq: Once | ORAL | Status: AC
  Administered 2023-08-11: 3.125 mg via ORAL
  Filled 2023-08-11: qty 1

## 2023-08-11 MED ORDER — AMIODARONE HCL 200 MG PO TABS
200.0000 mg | ORAL_TABLET | Freq: Every day | ORAL | 0 refills | Status: DC
  Filled 2023-08-11: qty 90, 90d supply, fill #0

## 2023-08-11 MED ORDER — ATORVASTATIN CALCIUM 40 MG PO TABS
40.0000 mg | ORAL_TABLET | Freq: Every day | ORAL | 0 refills | Status: DC
Start: 2023-08-11 — End: 2023-10-20
  Filled 2023-08-11: qty 90, 90d supply, fill #0

## 2023-08-11 MED ORDER — METFORMIN HCL 1000 MG PO TABS
1000.0000 mg | ORAL_TABLET | Freq: Every day | ORAL | 0 refills | Status: DC
Start: 2023-08-11 — End: 2023-10-20
  Filled 2023-08-11: qty 90, 90d supply, fill #0

## 2023-08-11 MED ORDER — ASPIRIN 81 MG PO CHEW
81.0000 mg | CHEWABLE_TABLET | Freq: Every day | ORAL | 3 refills | Status: DC
  Filled 2023-08-11: qty 90, 90d supply, fill #0

## 2023-08-11 NOTE — ED Notes (Signed)
 IV team at bedside

## 2023-08-11 NOTE — ED Provider Notes (Signed)
 [facility] Provider Note    Event Date/Time   First MD Initiated Contact with Patient 08/11/23 1759     (approximate)   History   Dizziness   HPI Jeremy [name] is a 68 y.o. male with history of A-fib on Xarelto, HTN, DM2, HLD, CKD stage III, polysubstance use, COPD, HFrEF with EF 35 to 40% presenting today for lightheadedness.  Patient was discharged from the hospital this morning following admission for altered mental status with confusion.  This was following recent admission for septic shock secondary to UTI with ESBL E. coli.  Altered mental status on that admission presumed to metabolic encephalopathy.  Patient was discharged to homeless shelter.  He states that homeless shelter would not take him so he called EMS to pick him up.  He reported after calling EMS to pick him up that he had 1 episode of lightheadedness and palpitations.  Has had intermittent palpitations since then with reported history of tachycardia.  Otherwise denies chest pain, shortness of breath, nausea, vomiting, abdominal pain.  Otherwise denies any acute complaints at this time.  Patient states he largely called EMS because he was not entirely sure how to take his medications.  Reviewed chart notes from recent admission.     Physical Exam   Triage Vital Signs: ED Triage Vitals [08/11/23 1541]  Encounter Vitals Group     BP 107/72     Systolic BP Percentile      Diastolic BP Percentile      Pulse Rate (!) 126     Resp 18     Temp 98.4 F (36.9 C)     Temp Source Oral     SpO2 100 %     Weight 220 lb (99.8 kg)     Height 5\' 11"  (1.803 m)     Head Circumference      Peak Flow      Pain Score 0     Pain Loc      Pain Education      Exclude from Growth Chart     Most recent vital signs: Vitals:   08/11/23 1830 08/11/23 1900  BP: (!) 134/92 (!) 136/58  Pulse: 96 61  Resp: 20 (!) 31  Temp:    SpO2: 100% 99%   Physical Exam: I have reviewed the vital signs and  nursing notes. General: Awake, alert, no acute distress.  Nontoxic appearing. Head:  Atraumatic, normocephalic.   ENT:  EOM intact, PERRL. Oral mucosa is pink and moist with no lesions. Neck: Neck is supple with full range of motion, No meningeal signs. Cardiovascular:  tachycardia, RR, No murmurs. Peripheral pulses palpable and equal bilaterally. Respiratory:  Symmetrical chest wall expansion.  No rhonchi, rales, or wheezes.  Good air movement throughout.  No use of accessory muscles.   Musculoskeletal:  No cyanosis or edema. Moving extremities with full ROM Abdomen:  Soft, nontender, nondistended. Neuro:  GCS 15, moving all four extremities, interacting appropriately. Speech clear. Psych:  Calm, appropriate.   Skin:  Warm, dry, no rash.    ED Results / Procedures / Treatments   Labs (all labs ordered are listed, but only abnormal results are displayed) Labs Reviewed  BASIC METABOLIC PANEL - Abnormal; Notable for the following components:      Result Value   Potassium 5.3 (*)    Glucose, Bld 136 (*)    BUN 25 (*)    Creatinine, Ser 1.59 (*)    GFR, Estimated 48 (*)  All other components within normal limits  CBC - Abnormal; Notable for the following components:   RBC 3.53 (*)    Hemoglobin 10.9 (*)    HCT 33.3 (*)    Platelets 454 (*)    All other components within normal limits  CBG MONITORING, ED  TROPONIN I (HIGH SENSITIVITY)     EKG My EKG interpretation: Rate of 126, sinus tachycardia.  No obvious A-fib with RVR but often difficult to fully ascertain P waves.  Questionable atrial flutter which patient does have prior history of.  No acute ST elevations or depressions.   RADIOLOGY    PROCEDURES:  Critical Care performed: No  Procedures   MEDICATIONS ORDERED IN ED: Medications  sodium chloride 0.9 % bolus 1,000 mL (has no administration in time range)  carvedilol (COREG) tablet 3.125 mg (3.125 mg Oral Given 08/11/23 1826)     IMPRESSION / MDM /  ASSESSMENT AND PLAN / ED COURSE  I reviewed the triage vital signs and the nursing notes.                              Differential diagnosis includes, but is not limited to, medication noncompliance, A-fib with RVR, atrial flutter with RVR, electrolyte abnormality, dehydration, low suspicion ACS  Patient's presentation is most consistent with acute complicated illness / injury requiring diagnostic workup.  Patient is a 68 year old male presenting today for 1 episode of lightheadedness with palpitations.  Symptoms resolved on arrival here.  He also stated he was largely here because he did not know how to take his medications after just being discharged this morning.  Vital signs and EKG show known atrial flutter and RVR in the 120s but it is variable.  Labs with normal troponin, BMP largely consistent with his baseline, and unremarkable CBC.  Patient was given his regular dose of carvedilol and had immediate improvement in heart rate back to below 100.  Was otherwise feeling well with no acute symptoms.  Given 1 L of fluids as well.  I strongly suspect that his symptoms today are largely from not taking his medications as prescribed.  We discussed the medications he should be on and send him home with a list of what he should be currently taking.  Patient is agreeable with this plan and told to follow-up with PCP.  Given strict return precautions for worsening symptoms.  The patient is on the cardiac monitor to evaluate for evidence of arrhythmia and/or significant heart rate changes. Clinical Course as of 08/11/23 2007  Thu Aug 11, 2023  1852 Patient's heart rate down to the mid 80s just on his carvedilol alone.  Do not need metoprolol at this time. [DW]  2006 Patient reassessed.  No distress at all at this time.  Heart rate still within normal limits underneath 100.  Suspect all related to not taking his prescribed medications.  Safe for discharge and instructed on appropriately using his  medications as prescribed [DW]    Clinical Course User Index [DW] Janith Lima, MD     FINAL CLINICAL IMPRESSION(S) / ED DIAGNOSES   Final diagnoses:  Tachycardia  Palpitations     Rx / DC Orders   ED Discharge Orders     None        Note:  This document was prepared using Dragon voice recognition software and may include unintentional dictation errors.   Janith Lima, MD 08/11/23 2009

## 2023-08-11 NOTE — ED Notes (Signed)
 Called CCMD and placed on central monitoring

## 2023-08-11 NOTE — Discharge Instructions (Signed)
 Please follow-up with your primary care provider outpatient for ongoing management.  Please ensure that you are taking all your medications exactly as prescribed to prevent any reoccurrence of palpitation symptoms.

## 2023-08-11 NOTE — ED Provider Triage Note (Signed)
 Emergency Medicine Provider Triage Evaluation Note  Jeremy [name] , a 68 y.o. male  was evaluated in triage.  Pt complains of dizziness for about 3 hours.  No numbness or weakness.  Has also had some palpitations in the meantime.  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  Does not know other medical history.  Review of Systems  Positive:  Negative:   Physical Exam  BP 107/72   Pulse (!) 126   Temp 98.4 F (36.9 C) (Oral)   Resp 18   Ht 5\' 11"  (1.803 m)   Wt 99.8 kg   SpO2 100%   BMI 30.68 kg/m  Gen:   Awake, no distress   Resp:  Normal effort  MSK:   Moves extremities without difficulty  Other:  Able to stand but somewhat antalgic gait.  Positive Romberg  Medical Decision Making  Medically screening exam initiated at 5:38 PM.  Appropriate orders placed.  Ayaz Sondgeroth was informed that the remainder of the evaluation will be completed by another provider, this initial triage assessment does not replace that evaluation, and the importance of remaining in the ED until their evaluation is complete.  Stroke workup.  Contact physician for further recommendation.  First nurse notes he will be roomed shortly   Christen Bame, [place] 08/11/23 1741

## 2023-08-11 NOTE — TOC Transition Note (Addendum)
 Transition of Care [facility]) - Discharge Note   Patient Details  Name: Jeremy [name] MRN: [id number] Date of Birth: Nov 03, 1955  Transition of Care [facility]) CM/SW Contact:  Chapman Fitch, RN Phone Number: 08/11/2023, 10:45 AM   Clinical Narrative:      Patient to dc today Meds to bed prior to dc Cab voucher provided to [employer] per patient request Bedside RN to have patient sign rider waiver and place hard copy in chart       Patient Goals and CMS Choice            Discharge Placement                       Discharge Plan and Services Additional resources added to the After Visit Summary for                                       Social Drivers of Health (SDOH) Interventions SDOH Screenings   Food Insecurity: Patient Unable To Answer (08/07/2023)  Housing: Patient Unable To Answer (08/07/2023)  Recent Concern: Housing - High Risk (06/17/2023)   Received from [facility]  Transportation Needs: Patient Unable To Answer (08/07/2023)  Utilities: Patient Unable To Answer (08/07/2023)  Depression (PHQ2-9): Low Risk  (10/30/2018)  Financial Resource Strain: Low Risk  (06/03/2023)   Received [facility]  Recent Concern: Financial Resource Strain - High Risk (03/22/2023)   Received from [facility]  Social Connections: Patient Unable To Answer (08/07/2023)  Tobacco Use: High Risk (08/06/2023)     Readmission Risk Interventions    08/09/2023    2:42 PM  Readmission Risk Prevention Plan  Transportation Screening Complete  HRI or Home Care Consult --  Palliative Care Screening Not Applicable  Skilled Nursing Facility Not Applicable

## 2023-08-11 NOTE — ED Triage Notes (Signed)
 Patient to ED via [facility] from the homeless shelter for dizziness. Pt states he was dc'd from hospital this AM- in blue hospital scrubs on arrival. Pt asking for staff to "call a number" for him. C/o weakness.

## 2023-08-11 NOTE — Progress Notes (Signed)
 Mobility Specialist - Progress Note   08/11/23 1100  Mobility  Activity Ambulated with assistance in hallway;Transferred from bed to chair  Level of Assistance Standby assist, set-up cues, supervision of patient - no hands on  Assistive Device Front wheel walker  Distance Ambulated (ft) 140 ft  Activity Response Tolerated well  Mobility visit 1 Mobility     Pt ambulated hallway with minG. No LOB. Returned to chair post-ambulation. Anticipating d/c    Jeremy [name] Mobility Specialist 08/11/23, 12:24 PM

## 2023-08-11 NOTE — ED Triage Notes (Signed)
 First Nurse Note;  Pt via [facility] from homeless shelter, homeless shelter would not take him so then he called EMS to come pick him up. Pt c/o dizziness after 20 mins while he was en route to the hospital. Pt is A&Ox4 and NAD 126 HR afib 98% on RA  132/78 BP  190 CBG

## 2023-08-11 NOTE — ED Notes (Signed)
 Called lab to add troponin.

## 2023-08-11 NOTE — Discharge Summary (Signed)
 Physician Discharge Summary   Jeremy [name]  male DOB: [birth year]  WUX:[id number]  PCP: Barbette Reichmann, MD  Admit date: 08/06/2023 Discharge date: 08/11/2023  Admitted From: homeless Disposition:  cab voucher given to homeless shelter.  All essential cardiac meds (for 3 months) refilled and brought to pt to take with him at discharge.  CODE STATUS: Full code  Discharge Instructions     Diet - low sodium heart healthy   Complete by: As directed    No wound care   Complete by: As directed       Hospital Course:  For full details, please see H&P, progress notes, consult notes and ancillary notes.  Briefly,  Jeremy [name] is a 68 y.o. African-American male with medical history significant for CHF, coronary artery disease, s/p CABG, type 2 diabetes mellitus and hypertension who presented to the emergency room with acute onset of altered mental status with confusion and somnolence.     He presented with similar symptoms from his correctional facility on 2/16 and was admitted for septic shock due to UTI with ESBL E. coli at which time his blood cultures also grew ESBL E. coli.  The patient left AMA on 2/20 and came back on 2/22 with complaint of "my heart is beating out of my chest" and of being hungry and cold.  Pt is homeless, brought here by Emergency planning/management officer.    Sepsis:  --tachycardia and leukocytosis, source ESBL bacteremia and UTI   ESBL bacteremia ESBL UTI --pos blood cx and urine cx on 2/16 from prior hospitalization, received 4 full days of IV meropenem before leaving AMA.  IV meropenem resumed on current presentation on 2/22, and continued for 5 more days.   --repeat blood cx from 2/22 neg.  Repeat urine cx from 2/22 insignificant growth --Discussed with ID pharm, pt has had enough IV meropenem to complete a course for treatment prior to discharge.  No need for further abx after discharge.   Acute metabolic encephalopathy:  likely secondary to above.  Mental status  was back to baseline.   DM2:  well controlled, HbA1c 6.4.  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    Generalized weakness:  --PT/OT eval rec no needs.  Pt walked the hallway with mobility specialist prior to discharge.   Hyperkalemia: resolved    Hx of CAD:  --cont ASA and statin   GERD:  continue on PPI     PAF:  continue on amio, xarelto.  --reduce home coreg from 6.25 mg to 3.125 mg BID due to intermittent low blood pressure.   HLD:  continue on statin    HTN:  --reduce home coreg from 6.25 mg to 3.125 mg BID due to intermittent low blood pressure. --home Entresto and torsemide held due to intermittent low blood pressure.   Obesity: BMI 30.5.     Discharge Diagnoses:  Principal Problem:   Sepsis due to gram-negative UTI [facility]) Active Problems:   Acute metabolic encephalopathy   Essential hypertension   Dyslipidemia   Paroxysmal atrial fibrillation (HCC)   GERD without esophagitis   Coronary artery disease   Type 2 diabetes mellitus with chronic kidney disease, with long-term current use of insulin (HCC)   Sepsis (HCC)   30 Day Unplanned Readmission Risk Score    Flowsheet Row ED to Hosp-Admission (Current) from 08/06/2023 in [facility]  30 Day Unplanned Readmission Risk Score (%) 52.73 Filed  at 08/11/2023 0801       This score is the patient's risk of an unplanned readmission within 30 days of being discharged (0 -100%). The score is based on dignosis, age, lab data, medications, orders, and past utilization.   Low:  0-14.9   Medium: 15-21.9   High: 22-29.9   Extreme: 30 and above         Discharge Instructions:  Allergies as of 08/11/2023   No Known Allergies      Medication List     PAUSE taking these medications    sacubitril-valsartan 24-26 MG Wait to take this until your doctor or other care provider tells you to start again. Due to  intermittent low blood pressure. Commonly known as: ENTRESTO Take 1 tablet by mouth every 12 (twelve) hours.   torsemide 20 MG tablet Wait to take this until your doctor or other care provider tells you to start again. Due to intermittent low blood pressure. Commonly known as: DEMADEX Take 1 tablet (20 mg total) by mouth daily.       TAKE these medications    acetaminophen 500 MG tablet Commonly known as: TYLENOL Take 2 tablets (1,000 mg total) by mouth every 8 (eight) hours as needed for moderate pain.   amiodarone 200 MG tablet Commonly known as: PACERONE Take 1 tablet (200 mg total) by mouth daily.   aspirin 81 MG chewable tablet Chew 1 tablet (81 mg total) by mouth daily.   atorvastatin 40 MG tablet Commonly known as: LIPITOR Take 1 tablet (40 mg total) by mouth daily.   carvedilol 3.125 MG tablet Commonly known as: COREG Take 1 tablet (3.125 mg total) by mouth 2 (two) times daily. Reduced from 6.25 mg. What changed:  medication strength how much to take additional instructions   metFORMIN 1000 MG tablet Commonly known as: GLUCOPHAGE Take 1 tablet (1,000 mg total) by mouth once daily with breakfast.   nitroGLYCERIN 0.4 MG SL tablet Commonly known as: NITROSTAT Place 1 tablet (0.4 mg total) under the tongue every 5 (five) minutes as needed for chest pain.   pantoprazole 20 MG tablet Commonly known as: PROTONIX Take 1 tablet (20 mg total) by mouth daily.   rivaroxaban 20 MG Tabs tablet Commonly known as: Xarelto Take 1 tablet (20 mg total) by mouth daily at 5 pm   traMADol 50 MG tablet Commonly known as: ULTRAM Take 1 tablet (50 mg total) by mouth daily as needed for pain.         Follow-up Information     Barbette Reichmann, MD Follow up in 1 week(s).   Specialty: Internal Medicine Contact information: [address] [place] [address] [phone]                 No Known Allergies   The results of  significant diagnostics from this hospitalization (including imaging, microbiology, ancillary and laboratory) are listed below for reference.   Consultations:   Procedures/Studies: DG Chest 2 View Result Date: 08/06/2023 CLINICAL DATA:  Chest pain EXAM: CHEST - 2 VIEW COMPARISON:  07/31/2023 FINDINGS: 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 No focal airspace consolidation, pleural effusion, or pneumothorax. IMPRESSION: No active cardiopulmonary disease. Electronically Signed   By: Duanne Guess D.O.   On: 08/06/2023 20:06   CT Head Wo Contrast Result Date: 07/31/2023 CLINICAL DATA:  Mental status change, unknown cause EXAM: CT HEAD WITHOUT CONTRAST TECHNIQUE: Contiguous axial images were obtained from the base of the skull through  the vertex without intravenous contrast. RADIATION DOSE REDUCTION: This exam was performed according to the departmental dose-optimization program which includes automated exposure control, adjustment of the mA and/or kV according to patient size and/or use of iterative reconstruction technique. COMPARISON:  04/29/2023 FINDINGS: Brain: No intracranial hemorrhage, mass effect, or midline shift. Stable degree of atrophy. No hydrocephalus. The basilar cisterns are patent. Moderate to advanced chronic small vessel ischemia. Remote right basal gangliar lacunar infarcts. Remote right cerebellar lacunar infarct. No evidence of territorial infarct or acute ischemia. No extra-axial or intracranial fluid collection. Vascular: Atherosclerosis of skullbase vasculature without hyperdense vessel or abnormal calcification. Skull: Normal. Negative for fracture or focal lesion. Sinuses/Orbits: Improved mucosal thickening throughout the ethmoid air cells. Improved left maxillary sinus mucosal thickening. Other: None. IMPRESSION: 1. No acute intracranial abnormality. 2. Atrophy and chronic small vessel ischemia. Remote lacunar infarcts. Electronically Signed   By: Narda Rutherford  M.D.   On: 07/31/2023 16:52   CT ABDOMEN PELVIS WO CONTRAST Result Date: 07/31/2023 CLINICAL DATA:  Sepsis, urinary tract infection.  Confusion. EXAM: CT ABDOMEN AND PELVIS WITHOUT CONTRAST TECHNIQUE: Multidetector CT imaging of the abdomen and pelvis was performed following the standard protocol without IV contrast. RADIATION DOSE REDUCTION: This exam was performed according to the departmental dose-optimization program which includes automated exposure control, adjustment of the mA and/or kV according to patient size and/or use of iterative reconstruction technique. COMPARISON:  02/10/2018 FINDINGS: Despite efforts by the technologist and patient, motion artifact is present on today's exam and could not be eliminated. This reduces exam sensitivity and specificity. Lower chest: Coronary and aortic atherosclerosis. Mild cardiomegaly. Hepatobiliary: Unremarkable Pancreas: Unremarkable Spleen: Unremarkable Adrenals/Urinary Tract: Mild wall thickening of the urinary bladder may be from cystitis. No hydronephrosis or definite hydroureter. Adrenal glands unremarkable. Stomach/Bowel: Descending and sigmoid colon diverticulosis, without compelling findings of active diverticulitis. Normal appendix. No dilated bowel. Vascular/Lymphatic: Atherosclerosis is present, including aortoiliac atherosclerotic disease. Reproductive: Unremarkable Other: No supplemental non-categorized findings. Musculoskeletal: Lower lumbar spondylosis and degenerative disc disease. IMPRESSION: 1. Mild wall thickening of the urinary bladder may be from cystitis. 2. Descending and sigmoid colon diverticulosis, without compelling findings of active diverticulitis. 3. Lower lumbar spondylosis and degenerative disc disease. 4. Coronary and aortic atherosclerosis. Mild cardiomegaly. Aortic Atherosclerosis (ICD10-I70.0). Electronically Signed   By: Gaylyn Rong M.D.   On: 07/31/2023 16:49   DG Chest 2 View Result Date: 07/31/2023 CLINICAL DATA:   Suspected sepsis. EXAM: CHEST - 2 VIEW COMPARISON:  06/26/2023. FINDINGS: Bilateral lung fields are clear. Bilateral costophrenic angles are clear. Stable cardio-mediastinal silhouette. Sternotomy wires noted. No acute osseous abnormalities. The soft tissues are within normal limits. IMPRESSION: No active cardiopulmonary disease. Electronically Signed   By: Jules Schick M.D.   On: 07/31/2023 12:31      Labs: BNP (last 3 results) Recent Labs    12/11/22 1213 12/28/22 1443  BNP 92.0 9.8   Basic Metabolic Panel: Recent Labs  Lab 08/07/23 0647 08/08/23 1014 08/09/23 0523 08/10/23 0525 08/11/23 0526  NA 140 137 137 136 134*  K 4.8 5.4* 4.9 4.6 4.5  CL 112* 106 107 107 106  CO2 19* 22 24 23  21*  GLUCOSE 166* 174* 147* 137* 122*  BUN 31* 31* 27* 29* 24*  CREATININE 1.44* 1.58* 1.67* 1.72* 1.54*  CALCIUM 8.7* 8.2* 8.6* 9.0 8.8*  MG  --   --   --   --  2.1   Liver Function Tests: Recent Labs  Lab 08/06/23 1941  AST 20  ALT  20  ALKPHOS 66  BILITOT 0.7  PROT 7.2  ALBUMIN 3.0*   No results for input(s): "LIPASE", "AMYLASE" in the last 168 hours. No results for input(s): "AMMONIA" in the last 168 hours. CBC: Recent Labs  Lab 08/07/23 0647 08/08/23 1014 08/09/23 0523 08/10/23 0525 08/11/23 0526  WBC 10.6* 10.0 8.8 9.6 7.9  HGB 10.9* 10.7* 9.9* 10.4* 9.9*  HCT 33.6* 31.7* 29.7* 31.0* 29.3*  MCV 96.3 94.3 93.1 93.7 92.4  PLT 541* 467* 489* 438* 396   Cardiac Enzymes: No results for input(s): "CKTOTAL", "CKMB", "CKMBINDEX", "TROPONINI" in the last 168 hours. BNP: Invalid input(s): "POCBNP" CBG: Recent Labs  Lab 08/04/23 1129  GLUCAP 228*   D-Dimer No results for input(s): "DDIMER" in the last 72 hours. Hgb A1c No results for input(s): "HGBA1C" in the last 72 hours. Lipid Profile No results for input(s): "CHOL", "HDL", "LDLCALC", "TRIG", "CHOLHDL", "LDLDIRECT" in the last 72 hours. Thyroid function studies No results for input(s): "TSH", "T4TOTAL", "T3FREE",  "THYROIDAB" in the last 72 hours.  Invalid input(s): "FREET3" Anemia work up No results for input(s): "VITAMINB12", "FOLATE", "FERRITIN", "TIBC", "IRON", "RETICCTPCT" in the last 72 hours. Urinalysis    Component Value Date/Time   COLORURINE YELLOW (A) 08/06/2023 2048   APPEARANCEUR CLEAR (A) 08/06/2023 2048   LABSPEC 1.010 08/06/2023 2048   PHURINE 5.0 08/06/2023 2048   GLUCOSEU 50 (A) 08/06/2023 2048   HGBUR MODERATE (A) 08/06/2023 2048   BILIRUBINUR NEGATIVE 08/06/2023 2048   KETONESUR NEGATIVE 08/06/2023 2048   PROTEINUR NEGATIVE 08/06/2023 2048   NITRITE NEGATIVE 08/06/2023 2048   LEUKOCYTESUR SMALL (A) 08/06/2023 2048   Sepsis Labs Recent Labs  Lab 08/08/23 1014 08/09/23 0523 08/10/23 0525 08/11/23 0526  WBC 10.0 8.8 9.6 7.9   Microbiology Recent Results (from the past 240 hours)  Resp panel by RT-PCR (RSV, Flu A&B, Covid) Anterior Nasal Swab     Status: None   Collection Time: 08/06/23  8:48 PM   Specimen: Anterior Nasal Swab  Result Value Ref Range Status   SARS Coronavirus 2 by RT PCR NEGATIVE NEGATIVE Final    Comment: (NOTE) SARS-CoV-2 target nucleic acids are NOT DETECTED.  The SARS-CoV-2 RNA is generally detectable in upper respiratory specimens during the acute phase of infection. The lowest concentration of SARS-CoV-2 viral copies this assay can detect is 138 copies/mL. A negative result does not preclude SARS-Cov-2 infection and should not be used as the sole basis for treatment or other patient management decisions. A negative result may occur with  improper specimen collection/handling, submission of specimen other than nasopharyngeal swab, presence of viral mutation(s) within the areas targeted by this assay, and inadequate number of viral copies(<138 copies/mL). A negative result must be combined with clinical observations, patient history, and epidemiological information. The expected result is Negative.  Fact Sheet for Patients:   BloggerCourse.com  Fact Sheet for Healthcare Providers:  SeriousBroker.it  This test is no t yet approved or cleared by the [place] FDA and  has been authorized for detection and/or diagnosis of SARS-CoV-2 by FDA under an Emergency Use Authorization (EUA). This EUA will remain  in effect (meaning this test can be used) for the duration of the COVID-19 declaration under Section 564(b)(1) of the Act, 21 U.S.C.section 360bbb-3(b)(1), unless the authorization is terminated  or revoked sooner.       Influenza A by PCR NEGATIVE NEGATIVE Final   Influenza B by PCR NEGATIVE NEGATIVE Final    Comment: (NOTE) The Xpert Xpress SARS-CoV-2/FLU/RSV plus assay is  intended as an aid in the diagnosis of influenza from Nasopharyngeal swab specimens and should not be used as a sole basis for treatment. Nasal washings and aspirates are unacceptable for Xpert Xpress SARS-CoV-2/FLU/RSV testing.  Fact Sheet for Patients: BloggerCourse.com  Fact Sheet for Healthcare Providers: SeriousBroker.it  This test is not yet approved or cleared by the [place] FDA and has been authorized for detection and/or diagnosis of SARS-CoV-2 by FDA under an Emergency Use Authorization (EUA). This EUA will remain in effect (meaning this test can be used) for the duration of the COVID-19 declaration under Section 564(b)(1) of the Act, 21 U.S.C. section 360bbb-3(b)(1), unless the authorization is terminated or revoked.     Resp Syncytial Virus by PCR NEGATIVE NEGATIVE Final    Comment: (NOTE) Fact Sheet for Patients: BloggerCourse.com  Fact Sheet for Healthcare Providers: SeriousBroker.it  This test is not yet approved or cleared by the [place] FDA and has been authorized for detection and/or diagnosis of SARS-CoV-2 by FDA under an Emergency Use  Authorization (EUA). This EUA will remain in effect (meaning this test can be used) for the duration of the COVID-19 declaration under Section 564(b)(1) of the Act, 21 U.S.C. section 360bbb-3(b)(1), unless the authorization is terminated or revoked.  Performed at [facility], [address] [place], [place] [address]   Blood Culture (routine x 2)     Status: None   Collection Time: 08/06/23  8:48 PM   Specimen: BLOOD  Result Value Ref Range Status   Specimen Description BLOOD LFA  Final   Special Requests   Final    BOTTLES DRAWN AEROBIC AND ANAEROBIC Blood Culture results may not be optimal due to an inadequate volume of blood received in culture bottles   Culture   Final    NO GROWTH 5 DAYS Performed at [facility], [address] [place], [place] [address]    Report Status 08/11/2023 FINAL  Final  Blood Culture (routine x 2)     Status: None   Collection Time: 08/06/23  8:48 PM   Specimen: BLOOD  Result Value Ref Range Status   Specimen Description BLOOD RFA  Final   Special Requests   Final    BOTTLES DRAWN AEROBIC AND ANAEROBIC Blood Culture results may not be optimal due to an inadequate volume of blood received in culture bottles   Culture   Final    NO GROWTH 5 DAYS Performed at [facility], [address] [place], [place] [address]    Report Status 08/11/2023 FINAL  Final  Urine Culture     Status: Abnormal   Collection Time: 08/06/23  8:48 PM   Specimen: Urine, Random  Result Value Ref Range Status   Specimen Description   Final    URINE, RANDOM Performed at [facility], [address], [place], [place] [address]    Special Requests   Final    NONE Reflexed from [id number] Performed at [facility], [address] [place], [place] [address]    Culture (A)  Final    <10,000 COLONIES/mL INSIGNIFICANT GROWTH Performed at [facility], [address], [place], [place] [address]    Report Status  08/08/2023 FINAL  Final     Total time spend on discharging this patient, including the last patient exam, discussing the hospital stay, instructions for ongoing care as it relates to all pertinent caregivers, as well as preparing the medical discharge records, prescriptions, and/or referrals as applicable, is 35  minutes.    Darlin Priestly, MD  [facility] 08/11/2023, 9:57 AM

## 2023-08-11 NOTE — Plan of Care (Signed)
  Problem: Activity: Goal: Risk for activity intolerance will decrease Outcome: Progressing   Problem: Nutrition: Goal: Adequate nutrition will be maintained Outcome: Progressing   Problem: Coping: Goal: Level of anxiety will decrease Outcome: Progressing   Problem: Pain Managment: Goal: General experience of comfort will improve and/or be controlled Outcome: Progressing   Problem: Safety: Goal: Ability to remain free from injury will improve Outcome: Progressing

## 2023-10-03 ENCOUNTER — Emergency Department
Admission: EM | Admit: 2023-10-03 | Discharge: 2023-10-03 | Disposition: A | Discharge status: Home / Self Care | Attending: Emergency Medicine | Admitting: Emergency Medicine

## 2023-10-03 ENCOUNTER — Other Ambulatory Visit: Payer: Self-pay

## 2023-10-03 ENCOUNTER — Emergency Department

## 2023-10-03 DIAGNOSIS — E119 Type 2 diabetes mellitus without complications: Secondary | ICD-10-CM | POA: Insufficient documentation

## 2023-10-03 DIAGNOSIS — I110 Hypertensive heart disease with heart failure: Secondary | ICD-10-CM | POA: Insufficient documentation

## 2023-10-03 DIAGNOSIS — I2510 Atherosclerotic heart disease of native coronary artery without angina pectoris: Secondary | ICD-10-CM | POA: Insufficient documentation

## 2023-10-03 DIAGNOSIS — J209 Acute bronchitis, unspecified: Secondary | ICD-10-CM | POA: Diagnosis not present

## 2023-10-03 DIAGNOSIS — I509 Heart failure, unspecified: Secondary | ICD-10-CM | POA: Insufficient documentation

## 2023-10-03 DIAGNOSIS — R059 Cough, unspecified: Secondary | ICD-10-CM | POA: Diagnosis present

## 2023-10-03 LAB — CBC
HCT: 35.1 % — ABNORMAL LOW (ref 39.0–52.0)
Hemoglobin: 11.1 g/dL — ABNORMAL LOW (ref 13.0–17.0)
MCH: 31.4 pg (ref 26.0–34.0)
MCHC: 31.6 g/dL (ref 30.0–36.0)
MCV: 99.4 fL (ref 80.0–100.0)
Platelets: 302 10*3/uL (ref 150–400)
RBC: 3.53 MIL/uL — ABNORMAL LOW (ref 4.22–5.81)
RDW: 14.9 % (ref 11.5–15.5)
WBC: 7 10*3/uL (ref 4.0–10.5)
nRBC: 0 % (ref 0.0–0.2)

## 2023-10-03 LAB — URINALYSIS, ROUTINE W REFLEX MICROSCOPIC
Bilirubin Urine: NEGATIVE
Glucose, UA: NEGATIVE mg/dL
Hgb urine dipstick: NEGATIVE
Ketones, ur: NEGATIVE mg/dL
Nitrite: NEGATIVE
Protein, ur: NEGATIVE mg/dL
Specific Gravity, Urine: 1.01 (ref 1.005–1.030)
Squamous Epithelial / HPF: 0 /HPF (ref 0–5)
pH: 6 (ref 5.0–8.0)

## 2023-10-03 LAB — BASIC METABOLIC PANEL WITH GFR
Anion gap: 5 (ref 5–15)
BUN: 13 mg/dL (ref 8–23)
CO2: 27 mmol/L (ref 22–32)
Calcium: 9.1 mg/dL (ref 8.9–10.3)
Chloride: 109 mmol/L (ref 98–111)
Creatinine, Ser: 1.56 mg/dL — ABNORMAL HIGH (ref 0.61–1.24)
GFR, Estimated: 49 mL/min — ABNORMAL LOW (ref >60)
Glucose, Bld: 143 mg/dL — ABNORMAL HIGH (ref 70–99)
Potassium: 4 mmol/L (ref 3.5–5.1)
Sodium: 141 mmol/L (ref 135–145)

## 2023-10-03 LAB — RESP PANEL BY RT-PCR (RSV, FLU A&B, COVID)  RVPGX2
Influenza A by PCR: NEGATIVE
Influenza B by PCR: NEGATIVE
Resp Syncytial Virus by PCR: NEGATIVE
SARS Coronavirus 2 by RT PCR: NEGATIVE

## 2023-10-03 MED ORDER — GUAIFENESIN-CODEINE 100-10 MG/5ML PO SOLN: 5.0000 mL | Freq: Four times a day (QID) | ORAL | 0 refills | Status: DC | PRN

## 2023-10-03 MED ORDER — AZITHROMYCIN 250 MG PO TABS
250.0000 mg | ORAL_TABLET | Freq: Every day | ORAL | 0 refills | Status: DC
  Filled 2023-10-03: qty 4, 4d supply, fill #0

## 2023-10-03 MED ORDER — HYDROCOD POLI-CHLORPHE POLI ER 10-8 MG/5ML PO SUER
5.0000 mL | Freq: Once | ORAL | Status: AC
  Administered 2023-10-03: 5 mL via ORAL
  Filled 2023-10-03: qty 5

## 2023-10-03 MED ORDER — GUAIFENESIN-CODEINE 100-10 MG/5ML PO SOLN
5.0000 mL | Freq: Four times a day (QID) | ORAL | 0 refills | Status: DC | PRN
  Filled 2023-10-03: qty 120, 6d supply, fill #0

## 2023-10-03 MED ORDER — AZITHROMYCIN 500 MG PO TABS
500.0000 mg | ORAL_TABLET | Freq: Once | ORAL | Status: AC
  Administered 2023-10-03: 500 mg via ORAL
  Filled 2023-10-03: qty 1

## 2023-10-03 MED ORDER — AZITHROMYCIN 250 MG PO TABS
250.0000 mg | ORAL_TABLET | Freq: Every day | ORAL | 0 refills | Status: DC
Start: 2023-10-03 — End: 2023-10-03

## 2023-10-03 MED ORDER — AZITHROMYCIN 250 MG PO TABS: 250.0000 mg | ORAL_TABLET | Freq: Every day | ORAL | 0 refills | Status: DC

## 2023-10-03 NOTE — ED Provider Notes (Signed)
 [facility] Provider Note    Event Date/Time   First MD Initiated Contact with Patient 10/03/23 1600     (approximate)  History   Chief Complaint: Dizziness  HPI  Kinneth Fujiwara is a 68 y.o. male with a past medical history of CAD, diabetes, CHF, hypertension, presents to the emergency department with complaints of dizziness.  Patient states he is homeless, states he has not eaten today and is asking for food, asking for a cupcake.  Patient states he has been coughing for the last few days has been feeling some shortness of breath at times.  Patient currently satting 99% on room air he is mildly tachycardic around 120 bpm we will recheck.  Patient denies any fever.  No urinary symptoms.  Physical Exam   Triage Vital Signs: ED Triage Vitals  Encounter Vitals Group     BP 10/03/23 1040 (!) 157/76     Systolic BP Percentile --      Diastolic BP Percentile --      Pulse Rate 10/03/23 1040 (!) 118     Resp 10/03/23 1040 20     Temp 10/03/23 1040 97.9 F (36.6 C)     Temp Source 10/03/23 1040 Oral     SpO2 10/03/23 1040 100 %     Weight 10/03/23 1041 200 lb (90.7 kg)     Height 10/03/23 1041 5\' 11"  (1.803 m)     Head Circumference --      Peak Flow --      Pain Score 10/03/23 1044 5     Pain Loc --      Pain Education --      Exclude from Growth Chart --     Most recent vital signs: Vitals:   10/03/23 1040  BP: (!) 157/76  Pulse: (!) 118  Resp: 20  Temp: 97.9 F (36.6 C)  SpO2: 100%    General: Awake, no distress.  CV:  Good peripheral perfusion.  Regular rate and rhythm  Resp:  Normal effort.  Equal breath sounds bilaterally.  Occasional cough during exam. Abd:  No distention.  Soft, nontender.  No rebound or guarding.  ED Results / Procedures / Treatments   EKG  EKG viewed and interpreted by myself shows sinus tachycardia at 126 bpm with a narrow QRS, normal axis, normal intervals, nonspecific ST changes.  RADIOLOGY  I have  reviewed and interpreted chest x-ray images.  No consolidation on my evaluation. Radiology is read the x-ray is negative for acute process.   MEDICATIONS ORDERED IN ED: Medications  azithromycin  (ZITHROMAX ) tablet 500 mg (has no administration in time range)  chlorpheniramine-HYDROcodone  (TUSSIONEX) 10-8 MG/5ML suspension 5 mL (has no administration in time range)     IMPRESSION / MDM / ASSESSMENT AND PLAN / ED COURSE  I reviewed the triage vital signs and the nursing notes.  Patient's presentation is most consistent with acute presentation with potential threat to life or bodily function.  Patient presents to the emergency department for vague complaints of dizziness as well as a cough.  Patient states he is homeless, states he has not eaten today.  Will attempt to feed the patient.  Patient does have a cough during examination, patient states has been worsening over the last 3 to 4 days.  Given the patient's history we will cover with antibiotics for possible acute bronchitis.  Will dose a one-time dose of cough medication in the emergency department as well as Zithromax .  Patient's chest x-ray today  shows no acute finding, no pneumonia or edema.  Patient's respiratory panel is negative.  Patient CBC is normal chemistry shows no significant finding.  We will plan to discharge home with Zithromax  and cough medication to cover for acute bronchitis.  Patient agreeable to plan.  Urinalysis shows no concerning finding.  We will cover with Zithromax  and the cough medication have the patient follow-up with a PCP.  FINAL CLINICAL IMPRESSION(S) / ED DIAGNOSES   Acute bronchitis  Rx / DC Orders   Zithromax  Codeine /guaifenesin   Note:  This document was prepared using Dragon voice recognition software and may include unintentional dictation errors.   Ruth Cove, MD 10/03/23 [id number]

## 2023-10-03 NOTE — ED Triage Notes (Addendum)
 Pt comes via EMS with c/o dizziness. Pt was tach in 130s. Pt given bolus by EMS. Pt has strong odor of urine. Pt is homeless per ems. Pt has cough and headache for last three days. Pt states some sob. Pt has labored breathing noted with accessory muscle use.   HR 122 131/81 99% RA CBG 146 97.3 temp

## 2023-10-11 ENCOUNTER — Emergency Department
Admission: EM | Admit: 2023-10-11 | Discharge: 2023-10-11 | Disposition: A | Discharge status: Home / Self Care | Attending: Emergency Medicine | Admitting: Emergency Medicine

## 2023-10-11 ENCOUNTER — Other Ambulatory Visit: Payer: Self-pay

## 2023-10-11 ENCOUNTER — Emergency Department

## 2023-10-11 DIAGNOSIS — I509 Heart failure, unspecified: Secondary | ICD-10-CM | POA: Diagnosis not present

## 2023-10-11 DIAGNOSIS — I110 Hypertensive heart disease with heart failure: Secondary | ICD-10-CM | POA: Diagnosis not present

## 2023-10-11 DIAGNOSIS — Z5900 Homelessness unspecified: Secondary | ICD-10-CM | POA: Insufficient documentation

## 2023-10-11 DIAGNOSIS — I4891 Unspecified atrial fibrillation: Secondary | ICD-10-CM | POA: Insufficient documentation

## 2023-10-11 DIAGNOSIS — R55 Syncope and collapse: Secondary | ICD-10-CM | POA: Diagnosis not present

## 2023-10-11 DIAGNOSIS — I2510 Atherosclerotic heart disease of native coronary artery without angina pectoris: Secondary | ICD-10-CM | POA: Insufficient documentation

## 2023-10-11 DIAGNOSIS — R059 Cough, unspecified: Secondary | ICD-10-CM

## 2023-10-11 DIAGNOSIS — R632 Polyphagia: Secondary | ICD-10-CM | POA: Diagnosis present

## 2023-10-11 DIAGNOSIS — Z951 Presence of aortocoronary bypass graft: Secondary | ICD-10-CM | POA: Diagnosis not present

## 2023-10-11 DIAGNOSIS — E119 Type 2 diabetes mellitus without complications: Secondary | ICD-10-CM | POA: Insufficient documentation

## 2023-10-11 LAB — CBC
HCT: 40.6 % (ref 39.0–52.0)
Hemoglobin: 13 g/dL (ref 13.0–17.0)
MCH: 31.2 pg (ref 26.0–34.0)
MCHC: 32 g/dL (ref 30.0–36.0)
MCV: 97.4 fL (ref 80.0–100.0)
Platelets: 373 10*3/uL (ref 150–400)
RBC: 4.17 MIL/uL — ABNORMAL LOW (ref 4.22–5.81)
RDW: 14.7 % (ref 11.5–15.5)
WBC: 5.2 10*3/uL (ref 4.0–10.5)
nRBC: 0 % (ref 0.0–0.2)

## 2023-10-11 LAB — ETHANOL: Alcohol, Ethyl (B): <15 mg/dL (ref <15)

## 2023-10-11 LAB — BASIC METABOLIC PANEL WITH GFR
Anion gap: 10 (ref 5–15)
BUN: 14 mg/dL (ref 8–23)
CO2: 22 mmol/L (ref 22–32)
Calcium: 9.4 mg/dL (ref 8.9–10.3)
Chloride: 109 mmol/L (ref 98–111)
Creatinine, Ser: 1.52 mg/dL — ABNORMAL HIGH (ref 0.61–1.24)
GFR, Estimated: 50 mL/min — ABNORMAL LOW (ref >60)
Glucose, Bld: 83 mg/dL (ref 70–99)
Potassium: 4.1 mmol/L (ref 3.5–5.1)
Sodium: 141 mmol/L (ref 135–145)

## 2023-10-11 LAB — TROPONIN I (HIGH SENSITIVITY): Troponin I (High Sensitivity): 23 ng/L — ABNORMAL HIGH (ref <18)

## 2023-10-11 MED ORDER — GUAIFENESIN-CODEINE 100-10 MG/5ML PO SOLN
5.0000 mL | Freq: Four times a day (QID) | ORAL | 0 refills | Status: DC | PRN
  Filled 2023-10-11: qty 120, 6d supply, fill #0

## 2023-10-11 MED ORDER — CARVEDILOL 6.25 MG PO TABS: 25.0000 mg | ORAL_TABLET | Freq: Two times a day (BID) | ORAL | Status: DC

## 2023-10-11 NOTE — ED Notes (Signed)
 AVS provided to and discussed with patient. Pt became verbally aggressive, stating "I am sick! I need to be admitted!" Explained to patient that the doctor did not find any reason to admit him and that he is being discharged from ED. Pt became upset, stating, "But I'm homeless!" Offered to give patient resources for homeless shelters, and he began yelling, "Get out! Can I just pee without yo mouth??" This RN left room and will call security to escort patient out of department.

## 2023-10-11 NOTE — ED Notes (Signed)
 EDP at bedside

## 2023-10-11 NOTE — ED Notes (Signed)
Pt provided with [employer] sandwich tray and Jeremy [name] juice.

## 2023-10-11 NOTE — ED Notes (Signed)
 Attempted X 3 to obtain ordered blood work without success. Lab called, will send phlebotomy to attempt.

## 2023-10-11 NOTE — ED Provider Notes (Signed)
 [facility] Provider Note    Event Date/Time   First MD Initiated Contact with Patient 10/11/23 1033     (approximate)  History   Chief Complaint: Hungry  HPI  Jeremy [name] is a 68 y.o. male with a past medical history of CHF, diabetes, hypertension, homelessness, presents to the emergency department for evaluation.  According to EMS they were called to the scene as they found the patient "passed out" on the sidewalk.  Here the patient states his main concern is wanting something to eat.  States he has not eaten in 2 days.  Patient is well-known to myself in the emergency department.  He does have a history of CHF he has had a CABG as well as CAD and diabetes.  Patient has a history of medical noncompliance, patient admits to myself that he has not been taking any of his prescribed medications recently.  Patient has a history of atrial fibrillation, pulse rate is somewhat elevated today around 105.  We will check labs we will dose the patient's carvedilol  while awaiting lab and chest x-ray results.  Physical Exam   Triage Vital Signs: ED Triage Vitals  Encounter Vitals Group     BP 10/11/23 1042 127/77     Systolic BP Percentile --      Diastolic BP Percentile --      Pulse Rate 10/11/23 1042 (!) 108     Resp 10/11/23 1042 20     Temp 10/11/23 1042 98.1 F (36.7 C)     Temp Source 10/11/23 1042 Oral     SpO2 10/11/23 1042 99 %     Weight 10/11/23 1043 200 lb (90.7 kg)     Height 10/11/23 1043 5\' 11"  (1.803 m)     Head Circumference --      Peak Flow --      Pain Score 10/11/23 1043 0     Pain Loc --      Pain Education --      Exclude from Growth Chart --     Most recent vital signs: Vitals:   10/11/23 1042 10/11/23 1100  BP: 127/77 127/87  Pulse: (!) 108   Resp: 20 (!) 22  Temp: 98.1 F (36.7 C)   SpO2: 99%     General: Awake, no distress.  CV:  Good peripheral perfusion.  Irregular rhythm rate around 110 bpm Resp:  Normal effort.   Equal breath sounds bilaterally.  Abd:  No distention.  Soft, nontender.  No rebound or guarding.  ED Results / Procedures / Treatments   EKG  EKG viewed and rapid by myself shows atrial flutter with variable block at 109 bpm with a narrow QRS, normal axis, normal intervals, nonspecific ST changes.  RADIOLOGY  I have reviewed and interpreted chest x-ray images.  No obvious consolidation seen on my evaluation. Radiology is read the x-ray is negative for acute process.   MEDICATIONS ORDERED IN ED: Medications - No data to display   IMPRESSION / MDM / ASSESSMENT AND PLAN / ED COURSE  I reviewed the triage vital signs and the nursing notes.  Patient's presentation is most consistent with acute presentation with potential threat to life or bodily function.  Patient presents to the emergency department for medical evaluation.  Initially called by bystanders they noted the patient to be passed out on the side of the road.  Once arrived to the emergency department patient is asking for something to eat he has no medical complaints besides  stating that he continues to have a cough.  Patient noted to be in atrial fibrillation which appears to be chronic but somewhat elevated today around 110, patient has not taking his normal medications.  Will dose Coreg  we will check labs including a cardiac panel.  Will obtain a chest x-ray given the patient's cough and continue to closely monitor.  Will attempt to feed the patient while waiting for his results.  Patient's workup in the emergency department today shows a normal CBC, reassuring chemistry.  Troponin unchanged from baseline.  Ethanol is negative.  Chest x-ray is clear.  After carvedilol  patient's heart rate is now in the low 90s.  Given the patient's reassuring workup we will discharge have the patient follow-up with his PCP.  I did discuss with the patient the importance of taking his medications.  FINAL CLINICAL IMPRESSION(S) / ED DIAGNOSES    Atrial fibrillation with rapid ventricular response   Note:  This document was prepared using Dragon voice recognition software and may include unintentional dictation errors.   Ruth Cove, MD 10/11/23 1421

## 2023-10-11 NOTE — ED Triage Notes (Signed)
 Pt BIB [facility] from street where he was found passed out by [employer]. Per EMS, pt with incontinence, BLE swelling, tachycardia. Pt states he has not eaten in two days, and is constantly asking for something to eat during triage.

## 2023-10-17 ENCOUNTER — Other Ambulatory Visit: Payer: Self-pay

## 2023-10-19 NOTE — Congregational Nurse Program (Signed)
  Dept: [phone]   Congregational Nurse Program Note  Date of Encounter: 10/18/2023 Client to [facility] day center, nurse led clinic with request for blood pressure check and emotional support. Client reports he was out of his medications. He reported he had then for "3 days" while he was in jail recently. His PCP is Dr. Kevan Peers. RN was able to make an appointment with his PCP office for Thursday 5/8 at 12:45. RN to assist with arranging transportation through his insurance. Juvenal Opoka BSN RN Past Medical History: Past Medical History:  Diagnosis Date   CHF (congestive heart failure) (HCC)    Coronary artery disease    Diabetes mellitus without complication (HCC)    Hypertension    S/P CABG x 1     Encounter Details:  Community Questionnaire - 10/18/23 1100       Questionnaire   Ask client: Do you give verbal consent for me to treat you today? Yes    Student Assistance N/A    Location Patient Served  Market researcher    Encounter Setting CN site    Population Status Unhoused    Insurance [place];Medicare    Insurance/Financial Assistance Referral N/A    Medication Have Medication Insecurities    Medical Provider Yes    Screening Referrals Made N/A    Medical Referrals Made N/A    Medical Appointment Completed N/A    CNP Interventions Advocate/Support;Navigate Healthcare System;Case Management;Educate    Screenings CN Performed Blood Pressure    ED Visit Averted N/A    Life-Saving Intervention Made N/A

## 2023-10-20 ENCOUNTER — Emergency Department
Admission: EM | Admit: 2023-10-20 | Discharge: 2023-10-20 | Disposition: A | Discharge status: Home / Self Care | Attending: Emergency Medicine | Admitting: Emergency Medicine

## 2023-10-20 ENCOUNTER — Emergency Department

## 2023-10-20 ENCOUNTER — Other Ambulatory Visit: Payer: Self-pay

## 2023-10-20 DIAGNOSIS — E1122 Type 2 diabetes mellitus with diabetic chronic kidney disease: Secondary | ICD-10-CM | POA: Diagnosis not present

## 2023-10-20 DIAGNOSIS — J449 Chronic obstructive pulmonary disease, unspecified: Secondary | ICD-10-CM | POA: Insufficient documentation

## 2023-10-20 DIAGNOSIS — N183 Chronic kidney disease, stage 3 unspecified: Secondary | ICD-10-CM | POA: Diagnosis not present

## 2023-10-20 DIAGNOSIS — R002 Palpitations: Secondary | ICD-10-CM | POA: Insufficient documentation

## 2023-10-20 DIAGNOSIS — I509 Heart failure, unspecified: Secondary | ICD-10-CM | POA: Insufficient documentation

## 2023-10-20 LAB — BASIC METABOLIC PANEL WITH GFR
Anion gap: 10 (ref 5–15)
BUN: 16 mg/dL (ref 8–23)
CO2: 24 mmol/L (ref 22–32)
Calcium: 9.5 mg/dL (ref 8.9–10.3)
Chloride: 106 mmol/L (ref 98–111)
Creatinine, Ser: 1.57 mg/dL — ABNORMAL HIGH (ref 0.61–1.24)
GFR, Estimated: 48 mL/min — ABNORMAL LOW (ref >60)
Glucose, Bld: 87 mg/dL (ref 70–99)
Potassium: 4.4 mmol/L (ref 3.5–5.1)
Sodium: 140 mmol/L (ref 135–145)

## 2023-10-20 LAB — CBC
HCT: 38.3 % — ABNORMAL LOW (ref 39.0–52.0)
Hemoglobin: 12 g/dL — ABNORMAL LOW (ref 13.0–17.0)
MCH: 30.8 pg (ref 26.0–34.0)
MCHC: 31.3 g/dL (ref 30.0–36.0)
MCV: 98.2 fL (ref 80.0–100.0)
Platelets: 282 10*3/uL (ref 150–400)
RBC: 3.9 MIL/uL — ABNORMAL LOW (ref 4.22–5.81)
RDW: 15.1 % (ref 11.5–15.5)
WBC: 5.5 10*3/uL (ref 4.0–10.5)
nRBC: 0 % (ref 0.0–0.2)

## 2023-10-20 LAB — TROPONIN I (HIGH SENSITIVITY)
Troponin I (High Sensitivity): 29 ng/L — ABNORMAL HIGH (ref <18)
Troponin I (High Sensitivity): 27 ng/L — ABNORMAL HIGH (ref <18)

## 2023-10-20 MED ORDER — CARVEDILOL 6.25 MG PO TABS
3.1250 mg | ORAL_TABLET | Freq: Once | ORAL | Status: AC
  Administered 2023-10-20: 3.125 mg via ORAL
  Filled 2023-10-20: qty 1

## 2023-10-20 MED ORDER — CARVEDILOL 3.125 MG PO TABS
3.1250 mg | ORAL_TABLET | Freq: Two times a day (BID) | ORAL | 0 refills | Status: DC
  Filled 2023-10-20: qty 180, 90d supply, fill #0

## 2023-10-20 MED ORDER — AMIODARONE HCL 200 MG PO TABS
200.0000 mg | ORAL_TABLET | Freq: Every day | ORAL | 0 refills | Status: DC
Start: 2023-10-20 — End: 2023-12-27
  Filled 2023-10-20: qty 90, 90d supply, fill #0

## 2023-10-20 MED ORDER — ATORVASTATIN CALCIUM 40 MG PO TABS
40.0000 mg | ORAL_TABLET | Freq: Every day | ORAL | 0 refills | Status: DC
  Filled 2023-10-20: qty 90, 90d supply, fill #0

## 2023-10-20 MED ORDER — METFORMIN HCL 1000 MG PO TABS
1000.0000 mg | ORAL_TABLET | Freq: Every day | ORAL | 0 refills | Status: DC
Start: 2023-10-20 — End: 2023-12-27
  Filled 2023-10-20: qty 90, 90d supply, fill #0

## 2023-10-20 MED ORDER — SODIUM CHLORIDE 0.9 % IV BOLUS
500.0000 mL | Freq: Once | INTRAVENOUS | Status: AC
  Administered 2023-10-20: 500 mL via INTRAVENOUS

## 2023-10-20 MED ORDER — ASPIRIN 81 MG PO CHEW
81.0000 mg | CHEWABLE_TABLET | Freq: Every day | ORAL | 3 refills | Status: DC
  Filled 2023-10-20: qty 90, 90d supply, fill #0

## 2023-10-20 MED ORDER — RIVAROXABAN 20 MG PO TABS
20.0000 mg | ORAL_TABLET | Freq: Every day | ORAL | 0 refills | Status: DC
  Filled 2023-10-20: qty 90, 90d supply, fill #0

## 2023-10-20 MED ORDER — AMIODARONE HCL 200 MG PO TABS
200.0000 mg | ORAL_TABLET | Freq: Once | ORAL | Status: AC
  Administered 2023-10-20: 200 mg via ORAL
  Filled 2023-10-20: qty 1

## 2023-10-20 NOTE — ED Notes (Signed)
 First nurse note- pt sent from [facility] for eval of weakness and afib.  Pt alert,  wheelchair in lobby

## 2023-10-20 NOTE — ED Notes (Signed)
 Pt to room 19  report called to sydni rn from triage.

## 2023-10-20 NOTE — Discharge Instructions (Signed)
 I have sent your prescriptions to the [facility] pharmacy.  Please make sure you are taking these medications as I believe this is why you keep having to come to the emergency department.  Follow-up with your primary care provider.

## 2023-10-20 NOTE — ED Notes (Addendum)
 Patient given sandwich tray and cranberry juice.

## 2023-10-20 NOTE — ED Notes (Signed)
 This RN and Medical illustrator attempted IV access at this time. Lab called to obtain initial blood work. IV team consult placed as well.

## 2023-10-20 NOTE — ED Notes (Signed)
IV team at bedside at this time.

## 2023-10-20 NOTE — ED Triage Notes (Signed)
 Pt reports he is having heart palpitations, states he can feel his heart beating in his head and neck. He denies chest pain, shortness of breath but does report feeling very weak EKG done in triage which shows HR 124 a-flutter.

## 2023-10-20 NOTE — ED Provider Notes (Signed)
 [facility] Provider Note    Event Date/Time   First MD Initiated Contact with Patient 10/20/23 1644     (approximate)   History   Palpitations   HPI Jeremy [name] is a 68 y.o. male with history of atrial fibrillation, DM2, CKD stage III, HFrEF, ACS, polysubstance abuse, COPD presenting today for palpitations.  Patient states he has had palpitations for the past couple of days.  Also feels intermittently weak.  Denies chest pain, nausea, vomiting, abdominal pain, leg pain, leg swelling.  Seen in the ED 9 days ago for similar symptoms.  Patient admits to not taking his medication consistently at home.  Did not take his Coreg  or amiodarone  today.  Chart review: Patient seen frequently in the ED for similar symptoms.  Has a history of medication noncompliance.     Physical Exam   Triage Vital Signs: ED Triage Vitals  Encounter Vitals Group     BP 10/20/23 1618 (!) 132/95     Systolic BP Percentile --      Diastolic BP Percentile --      Pulse Rate 10/20/23 1618 (!) 118     Resp 10/20/23 1618 18     Temp 10/20/23 1618 97.8 F (36.6 C)     Temp Source 10/20/23 1618 Oral     SpO2 10/20/23 1618 100 %     Weight 10/20/23 1625 220 lb (99.8 kg)     Height 10/20/23 1625 5\' 11"  (1.803 m)     Head Circumference --      Peak Flow --      Pain Score 10/20/23 1625 0     Pain Loc --      Pain Education --      Exclude from Growth Chart --     Most recent vital signs: Vitals:   10/20/23 2000 10/20/23 2009  BP: 122/73 (!) 142/75  Pulse: 96 (!) 104  Resp: (!) 22   Temp:    SpO2: 96%    Physical Exam: I have reviewed the vital signs and nursing notes. General: Awake, alert, no acute distress.  Nontoxic appearing. Head:  Atraumatic, normocephalic.   ENT:  EOM intact, PERRL. Oral mucosa is pink and moist with no lesions. Neck: Neck is supple with full range of motion, No meningeal signs. Cardiovascular: Tachycardia with irregular rhythm, No murmurs.  Peripheral pulses palpable and equal bilaterally. Respiratory:  Symmetrical chest wall expansion.  No rhonchi, rales, or wheezes.  Good air movement throughout.  No use of accessory muscles.   Musculoskeletal:  No cyanosis or edema. Moving extremities with full ROM Abdomen:  Soft, nontender, nondistended. Neuro:  GCS 15, moving all four extremities, interacting appropriately. Speech clear. Psych:  Calm, appropriate.   Skin:  Warm, dry, no rash.    ED Results / Procedures / Treatments   Labs (all labs ordered are listed, but only abnormal results are displayed) Labs Reviewed  BASIC METABOLIC PANEL WITH GFR - Abnormal; Notable for the following components:      Result Value   Creatinine, Ser 1.57 (*)    GFR, Estimated 48 (*)    All other components within normal limits  CBC - Abnormal; Notable for the following components:   RBC 3.90 (*)    Hemoglobin 12.0 (*)    HCT 38.3 (*)    All other components within normal limits  TROPONIN I (HIGH SENSITIVITY) - Abnormal; Notable for the following components:   Troponin I (High Sensitivity) 29 (*)  All other components within normal limits  TROPONIN I (HIGH SENSITIVITY) - Abnormal; Notable for the following components:   Troponin I (High Sensitivity) 27 (*)    All other components within normal limits     EKG My EKG interpretation: Rate of 124, appears atrial flutter with 2-1 conduction although significant variability.  No acute ST elevations or depressions.  Overall appears consistent with prior EKGs including most recent 1 on 10/11/2023   RADIOLOGY Independently interpreted chest x-ray with no acute pathology   PROCEDURES:  Critical Care performed: No  Procedures   MEDICATIONS ORDERED IN ED: Medications  sodium chloride  0.9 % bolus 500 mL (0 mLs Intravenous Stopped 10/20/23 1939)  carvedilol  (COREG ) tablet 3.125 mg (3.125 mg Oral Given 10/20/23 1728)  amiodarone  (PACERONE ) tablet 200 mg (200 mg Oral Given 10/20/23 1728)   carvedilol  (COREG ) tablet 3.125 mg (3.125 mg Oral Given 10/20/23 2009)     IMPRESSION / MDM / ASSESSMENT AND PLAN / ED COURSE  I reviewed the triage vital signs and the nursing notes.                              Differential diagnosis includes, but is not limited to, chronic atrial fibrillation versus atrial flutter, dehydration, electrolyte abnormality, medication noncompliance  Patient's presentation is most consistent with exacerbation of chronic illness.  Patient is a 68 year old male with history of chronic medication noncompliance presenting today for palpitations.  EKG shows atrial flutter with variable rate versus A-fib.  He admits to not taking his home medications.  Will dose his home Coreg  and amiodarone  here.  Otherwise vital signs with blood pressure stable.  Physical exam elsewise unremarkable.  Given 500 cc of fluid as well.  EKG consistent with his baseline otherwise with no new ischemic findings.  Chest x-ray negative.  Troponin stable x 2 with no chest pain.  No indication of ACS.  Suspect patient's symptoms are all related to medication noncompliance.  His heart rate did improve and stay consistently below 105 following medications.  No indication for further workup.  This time.  Patient seems persistent on mostly just wanting food and a place to sleep tonight.  I stated we could not keep him here in the bed overnight as he has no reason for admission.  He was then getting angry with staff about being discharged.  Represcribed his home medications.  Patient ready for discharge.  The patient is on the cardiac monitor to evaluate for evidence of arrhythmia and/or significant heart rate changes. Clinical Course as of 10/20/23 2025  Thu Oct 20, 2023  1851 Creatinine(!): 1.57 Stable at baseline [DW]  1852 Discussed with patient's labs that initial round looks reassuring and we will get troponin.  His heart rate has improved and he appears stable at this time.  Patient stated "you  cannot put me out there".  I stated if everything looked well and his labs and his heart rate continued to be within normal limits then we can discharge him with prescriptions for his medications.  He stated he wanted to sleep in the hospital overnight.  I stated that we would not be able to do that if he is stable to be discharged.  Patient continued to argue that he should be allowed to sleep here overnight even if his workup was normal. I am concerned for malingering given his frequent ED visits and medication noncompliance [DW]  2022 Troponin I (High Sensitivity)(!): 27 Stable,  no chest pain [DW]    Clinical Course User Index [DW] Kandee Orion, MD     FINAL CLINICAL IMPRESSION(S) / ED DIAGNOSES   Final diagnoses:  Palpitations     Rx / DC Orders   ED Discharge Orders          Ordered    amiodarone  (PACERONE ) 200 MG tablet  Daily        10/20/23 2025    aspirin  81 MG chewable tablet  Daily       Note to Pharmacy: 36 = pack size   10/20/23 2025    atorvastatin  (LIPITOR) 40 MG tablet  Daily        10/20/23 2025    carvedilol  (COREG ) 3.125 MG tablet  2 times daily        10/20/23 2025    metFORMIN  (GLUCOPHAGE ) 1000 MG tablet  Daily with breakfast        10/20/23 2025    rivaroxaban  (XARELTO ) 20 MG TABS tablet  Daily        10/20/23 2025             Note:  This document was prepared using Dragon voice recognition software and may include unintentional dictation errors.   Kandee Orion, MD 10/20/23 2026

## 2023-10-21 ENCOUNTER — Other Ambulatory Visit: Payer: Self-pay

## 2023-10-21 ENCOUNTER — Encounter (HOSPITAL_COMMUNITY): Payer: Self-pay

## 2023-10-21 ENCOUNTER — Emergency Department (HOSPITAL_COMMUNITY)
Admission: EM | Admit: 2023-10-21 | Discharge: 2023-10-21 | Disposition: A | Discharge status: Home / Self Care | Attending: Emergency Medicine | Admitting: Emergency Medicine

## 2023-10-21 ENCOUNTER — Emergency Department (HOSPITAL_COMMUNITY)

## 2023-10-21 DIAGNOSIS — Z79899 Other long term (current) drug therapy: Secondary | ICD-10-CM | POA: Insufficient documentation

## 2023-10-21 DIAGNOSIS — I110 Hypertensive heart disease with heart failure: Secondary | ICD-10-CM | POA: Diagnosis not present

## 2023-10-21 DIAGNOSIS — I2510 Atherosclerotic heart disease of native coronary artery without angina pectoris: Secondary | ICD-10-CM | POA: Diagnosis not present

## 2023-10-21 DIAGNOSIS — R002 Palpitations: Secondary | ICD-10-CM | POA: Diagnosis not present

## 2023-10-21 DIAGNOSIS — Z5900 Homelessness unspecified: Secondary | ICD-10-CM | POA: Insufficient documentation

## 2023-10-21 DIAGNOSIS — Z7984 Long term (current) use of oral hypoglycemic drugs: Secondary | ICD-10-CM | POA: Diagnosis not present

## 2023-10-21 DIAGNOSIS — Z7982 Long term (current) use of aspirin: Secondary | ICD-10-CM | POA: Insufficient documentation

## 2023-10-21 DIAGNOSIS — I509 Heart failure, unspecified: Secondary | ICD-10-CM | POA: Insufficient documentation

## 2023-10-21 DIAGNOSIS — E119 Type 2 diabetes mellitus without complications: Secondary | ICD-10-CM | POA: Diagnosis not present

## 2023-10-21 DIAGNOSIS — M79671 Pain in right foot: Secondary | ICD-10-CM | POA: Insufficient documentation

## 2023-10-21 LAB — BASIC METABOLIC PANEL WITH GFR
Anion gap: 11 (ref 5–15)
BUN: 19 mg/dL (ref 8–23)
CO2: 20 mmol/L — ABNORMAL LOW (ref 22–32)
Calcium: 9.7 mg/dL (ref 8.9–10.3)
Chloride: 107 mmol/L (ref 98–111)
Creatinine, Ser: 1.53 mg/dL — ABNORMAL HIGH (ref 0.61–1.24)
GFR, Estimated: 50 mL/min — ABNORMAL LOW (ref >60)
Glucose, Bld: 90 mg/dL (ref 70–99)
Potassium: 4.4 mmol/L (ref 3.5–5.1)
Sodium: 138 mmol/L (ref 135–145)

## 2023-10-21 LAB — CBC WITH DIFFERENTIAL/PLATELET
Abs Immature Granulocytes: 0.01 10*3/uL (ref 0.00–0.07)
Basophils Absolute: 0.1 10*3/uL (ref 0.0–0.1)
Basophils Relative: 1 %
Eosinophils Absolute: 0.3 10*3/uL (ref 0.0–0.5)
Eosinophils Relative: 4 %
HCT: 42.1 % (ref 39.0–52.0)
Hemoglobin: 13.4 g/dL (ref 13.0–17.0)
Immature Granulocytes: 0 %
Lymphocytes Relative: 36 %
Lymphs Abs: 2.6 10*3/uL (ref 0.7–4.0)
MCH: 31.6 pg (ref 26.0–34.0)
MCHC: 31.8 g/dL (ref 30.0–36.0)
MCV: 99.3 fL (ref 80.0–100.0)
Monocytes Absolute: 0.8 10*3/uL (ref 0.1–1.0)
Monocytes Relative: 11 %
Neutro Abs: 3.5 10*3/uL (ref 1.7–7.7)
Neutrophils Relative %: 48 %
Platelets: 250 10*3/uL (ref 150–400)
RBC: 4.24 MIL/uL (ref 4.22–5.81)
RDW: 15 % (ref 11.5–15.5)
WBC: 7.3 10*3/uL (ref 4.0–10.5)
nRBC: 0 % (ref 0.0–0.2)

## 2023-10-21 MED ORDER — FENTANYL CITRATE PF 50 MCG/ML IJ SOSY
50.0000 ug | PREFILLED_SYRINGE | Freq: Once | INTRAMUSCULAR | Status: AC
  Administered 2023-10-21: 50 ug via INTRAVENOUS
  Filled 2023-10-21: qty 1

## 2023-10-21 MED ORDER — ONDANSETRON HCL 4 MG/2ML IJ SOLN
4.0000 mg | Freq: Once | INTRAMUSCULAR | Status: AC
  Administered 2023-10-21: 4 mg via INTRAVENOUS
  Filled 2023-10-21: qty 2

## 2023-10-21 NOTE — Discharge Instructions (Addendum)
 Turn for emergent symptoms.  Your workup today was reassuring.
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
## 2023-10-21 NOTE — ED Triage Notes (Addendum)
 Pt reports R foot pain x "a while". Pt homeless and walks a lot. Tender around joint.  H/x afib, noncompliant with meds 142/92 122HR 99% 118cbg

## 2023-10-23 ENCOUNTER — Other Ambulatory Visit (HOSPITAL_COMMUNITY): Payer: Self-pay

## 2023-10-23 ENCOUNTER — Emergency Department (HOSPITAL_COMMUNITY)

## 2023-10-23 ENCOUNTER — Other Ambulatory Visit: Payer: Self-pay

## 2023-10-23 ENCOUNTER — Emergency Department (HOSPITAL_COMMUNITY)
Admission: EM | Admit: 2023-10-23 | Discharge: 2023-10-23 | Disposition: A | Discharge status: Home / Self Care | Attending: Emergency Medicine | Admitting: Emergency Medicine

## 2023-10-23 DIAGNOSIS — Z7901 Long term (current) use of anticoagulants: Secondary | ICD-10-CM | POA: Insufficient documentation

## 2023-10-23 DIAGNOSIS — M25571 Pain in right ankle and joints of right foot: Secondary | ICD-10-CM | POA: Insufficient documentation

## 2023-10-23 DIAGNOSIS — Z7982 Long term (current) use of aspirin: Secondary | ICD-10-CM | POA: Diagnosis not present

## 2023-10-23 DIAGNOSIS — G8929 Other chronic pain: Secondary | ICD-10-CM | POA: Diagnosis not present

## 2023-10-23 DIAGNOSIS — Z5900 Homelessness unspecified: Secondary | ICD-10-CM | POA: Diagnosis not present

## 2023-10-23 MED ORDER — ACETAMINOPHEN 500 MG PO TABS
500.0000 mg | ORAL_TABLET | Freq: Four times a day (QID) | ORAL | 0 refills | Status: DC | PRN
  Filled 2023-10-23: qty 30, 8d supply, fill #0

## 2023-10-23 MED ORDER — IBUPROFEN 200 MG PO TABS
400.0000 mg | ORAL_TABLET | Freq: Once | ORAL | Status: AC
  Administered 2023-10-23: 400 mg via ORAL
  Filled 2023-10-23: qty 2

## 2023-10-23 MED ORDER — ACETAMINOPHEN 500 MG PO TABS
1000.0000 mg | ORAL_TABLET | Freq: Once | ORAL | Status: AC
  Administered 2023-10-23: 1000 mg via ORAL
  Filled 2023-10-23: qty 2
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
## 2023-10-23 NOTE — ED Notes (Signed)
 Pt appears asleep at this time, will attempt VS

## 2023-10-23 NOTE — Progress Notes (Signed)
 Orthopedic Tech Progress Note Patient Details:  Jeremy [name] 09/06/1955 [id number]  Ortho Devices Type of Ortho Device: Crutches Ortho Device/Splint Interventions: Ordered, Application, Adjustment   Post Interventions Patient Tolerated: Well, Ambulated well Instructions Provided: Poper ambulation with device  Herbie Loll 10/23/2023, 6:28 PM

## 2023-10-23 NOTE — ED Notes (Signed)
 Declined last set vs

## 2023-10-23 NOTE — Discharge Instructions (Signed)
 I have sent your prescription for Tylenol .  You can take that as needed as prescribed for ankle pain.  Please follow-up with your orthopedic doctor to discuss your ankle pain.

## 2023-10-23 NOTE — ED Notes (Signed)
 Endorses cp/sob but states this is "everyday" for him in frequency

## 2023-10-23 NOTE — ED Notes (Signed)
 Pt taken to xray, will obtain VS when he returns

## 2023-10-23 NOTE — ED Notes (Signed)
 Patient is resting comfortably.

## 2023-10-23 NOTE — ED Notes (Signed)
 Rise and fall of chest noted, pt in nad at this time

## 2023-10-24 ENCOUNTER — Emergency Department (HOSPITAL_COMMUNITY)
Admission: EM | Admit: 2023-10-24 | Discharge: 2023-10-24 | Disposition: A | Discharge status: Home / Self Care | Source: Home / Self Care | Attending: Emergency Medicine | Admitting: Emergency Medicine

## 2023-10-24 ENCOUNTER — Other Ambulatory Visit (HOSPITAL_COMMUNITY): Payer: Self-pay

## 2023-10-24 ENCOUNTER — Other Ambulatory Visit: Payer: Self-pay

## 2023-10-24 DIAGNOSIS — M25571 Pain in right ankle and joints of right foot: Secondary | ICD-10-CM | POA: Insufficient documentation

## 2023-10-24 DIAGNOSIS — I509 Heart failure, unspecified: Secondary | ICD-10-CM | POA: Insufficient documentation

## 2023-10-24 DIAGNOSIS — E1122 Type 2 diabetes mellitus with diabetic chronic kidney disease: Secondary | ICD-10-CM | POA: Insufficient documentation

## 2023-10-24 DIAGNOSIS — N189 Chronic kidney disease, unspecified: Secondary | ICD-10-CM | POA: Insufficient documentation

## 2023-10-24 DIAGNOSIS — G8929 Other chronic pain: Secondary | ICD-10-CM | POA: Insufficient documentation

## 2023-10-24 DIAGNOSIS — I2510 Atherosclerotic heart disease of native coronary artery without angina pectoris: Secondary | ICD-10-CM | POA: Insufficient documentation

## 2023-10-24 DIAGNOSIS — Z5900 Homelessness unspecified: Secondary | ICD-10-CM | POA: Insufficient documentation

## 2023-10-24 DIAGNOSIS — I130 Hypertensive heart and chronic kidney disease with heart failure and stage 1 through stage 4 chronic kidney disease, or unspecified chronic kidney disease: Secondary | ICD-10-CM | POA: Insufficient documentation

## 2023-10-24 DIAGNOSIS — Z7901 Long term (current) use of anticoagulants: Secondary | ICD-10-CM | POA: Insufficient documentation

## 2023-10-24 DIAGNOSIS — Z7951 Long term (current) use of inhaled steroids: Secondary | ICD-10-CM | POA: Insufficient documentation

## 2023-10-24 DIAGNOSIS — N3090 Cystitis, unspecified without hematuria: Secondary | ICD-10-CM | POA: Diagnosis not present

## 2023-10-24 DIAGNOSIS — N390 Urinary tract infection, site not specified: Secondary | ICD-10-CM | POA: Diagnosis not present

## 2023-10-24 DIAGNOSIS — Z7982 Long term (current) use of aspirin: Secondary | ICD-10-CM | POA: Insufficient documentation

## 2023-10-24 DIAGNOSIS — J449 Chronic obstructive pulmonary disease, unspecified: Secondary | ICD-10-CM | POA: Insufficient documentation

## 2023-10-24 DIAGNOSIS — Z79899 Other long term (current) drug therapy: Secondary | ICD-10-CM | POA: Insufficient documentation

## 2023-10-24 DIAGNOSIS — Z7984 Long term (current) use of oral hypoglycemic drugs: Secondary | ICD-10-CM | POA: Insufficient documentation

## 2023-10-24 MED ORDER — IBUPROFEN 200 MG PO TABS
400.0000 mg | ORAL_TABLET | Freq: Once | ORAL | Status: AC
  Administered 2023-10-24: 400 mg via ORAL
  Filled 2023-10-24: qty 2

## 2023-10-24 MED ORDER — ACETAMINOPHEN 500 MG PO TABS
1000.0000 mg | ORAL_TABLET | Freq: Once | ORAL | Status: AC
  Administered 2023-10-24: 1000 mg via ORAL
  Filled 2023-10-24: qty 2

## 2023-10-24 NOTE — Discharge Instructions (Addendum)
 It was a pleasure caring for you today.  Please follow-up with your Ortho provider.  Seek emergency care if experiencing any new or worsening symptoms.  Alternating between 650 mg Tylenol  and 400 mg Advil : The best way to alternate taking Acetaminophen  (example Tylenol ) and Ibuprofen  (example Advil /Motrin ) is to take them 3 hours apart. For example, if you take ibuprofen  at 6 am you can then take Tylenol  at 9 am. You can continue this regimen throughout the day, making sure you do not exceed the recommended maximum dose for each drug.

## 2023-10-24 NOTE — ED Provider Notes (Signed)
 [place] EMERGENCY DEPARTMENT AT [facility] Provider Note   CSN: [id number] Arrival date & time: 10/24/23  1046     History  Chief Complaint  Patient presents with   Ankle Pain    Here yesterday for same. Dc c crutches, can bear weight on legs, has yellow sputum states he has PNA     Jeremy [name] is a 68 y.o. male with PMHx CHF, CAD, DM, HTN, post-traumatic osteoarthritis of right ankle, polysubstance abuse, afib, CKD, HLD, COPD who presents to ED concerned for right ankle pain. This pain is chronic for patient and he was seen in ED yesterday for same symptom. Patient complaining because his pain came back today and his crutches were too short for him. Patient without any other complaints today.    Ankle Pain      Home Medications Prior to Admission medications   Medication Sig Start Date End Date Taking? Authorizing Provider  acetaminophen  (TYLENOL ) 500 MG tablet Take 1 tablet (500 mg total) by mouth every 6 (six) hours as needed. 10/23/23   Afton Horse T, DO  amiodarone  (PACERONE ) 200 MG tablet Take 1 tablet (200 mg total) by mouth daily. 10/20/23 01/19/24  Kandee Orion, MD  aspirin  81 MG chewable tablet Chew 1 tablet (81 mg total) by mouth daily. 10/20/23 02/17/24  Kandee Orion, MD  atorvastatin  (LIPITOR) 40 MG tablet Take 1 tablet (40 mg total) by mouth daily. 10/20/23   Kandee Orion, MD  azithromycin  (ZITHROMAX ) 250 MG tablet Take 1 tablet (250 mg total) by mouth daily. 10/03/23   Ruth Cove, MD  carvedilol  (COREG ) 3.125 MG tablet Take 1 tablet (3.125 mg total) by mouth 2 (two) times daily. Reduced from 6.25 mg. 10/20/23 01/19/24  Kandee Orion, MD  guaiFENesin -codeine  100-10 MG/5ML syrup Take 5 mLs by mouth every 6 (six) hours as needed for cough. 10/11/23   Ruth Cove, MD  metFORMIN  (GLUCOPHAGE ) 1000 MG tablet Take 1 tablet (1,000 mg total) by mouth once daily with breakfast. 10/20/23 01/19/24  Kandee Orion, MD  nitroGLYCERIN  (NITROSTAT ) 0.4 MG SL  tablet Place 1 tablet (0.4 mg total) under the tongue every 5 (five) minutes as needed for chest pain. 11/26/22   Alexander, Natalie, DO  pantoprazole  (PROTONIX ) 20 MG tablet Take 1 tablet (20 mg total) by mouth daily. 06/28/23   Alexander, Natalie, DO  rivaroxaban  (XARELTO ) 20 MG TABS tablet Take 1 tablet (20 mg total) by mouth daily at 5 pm 10/20/23 01/19/24  Kandee Orion, MD  sacubitril -valsartan  (ENTRESTO ) 24-26 MG Take 1 tablet by mouth every 12 (twelve) hours. 06/28/23   Alexander, Natalie, DO  torsemide  (DEMADEX ) 20 MG tablet Take 1 tablet (20 mg total) by mouth daily. 06/28/23   Alexander, Natalie, DO  traMADol  (ULTRAM ) 50 MG tablet Take 1 tablet (50 mg total) by mouth daily as needed for pain. 06/03/23         Allergies    Patient has no known allergies.    Review of Systems   Review of Systems  Musculoskeletal:        Ankle pain    Physical Exam Updated Vital Signs BP (!) 149/74   Pulse 79   Temp 97.7 F (36.5 C) (Oral)   Resp 18   SpO2 99%  Physical Exam Vitals and nursing note reviewed.  Constitutional:      General: He is not in acute distress. HENT:     Head: Normocephalic and atraumatic.     Mouth/Throat:  Mouth: Mucous membranes are moist.  Eyes:     General: No scleral icterus.       Right eye: No discharge.        Left eye: No discharge.     Conjunctiva/sclera: Conjunctivae normal.  Cardiovascular:     Rate and Rhythm: Normal rate.     Pulses: Normal pulses.  Pulmonary:     Effort: Pulmonary effort is normal.  Abdominal:     General: Abdomen is flat.  Musculoskeletal:     Right lower leg: No edema.     Left lower leg: No edema.     Comments: Right ankle with well healing lateral ulcer. Mild to moderately swollen. ROM intact. No erythema or increased warmth. +2 pedal pulse. Sensation to light touch intact. Area non-tense.   Skin:    General: Skin is warm and dry.     Findings: No rash.  Neurological:     General: No focal deficit present.     Mental  Status: He is alert and oriented to person, place, and time. Mental status is at baseline.  Psychiatric:        Mood and Affect: Mood normal.     ED Results / Procedures / Treatments   Labs (all labs ordered are listed, but only abnormal results are displayed) Labs Reviewed - No data to display  EKG None  Radiology DG Ankle 2 Views Right Result Date: 10/23/2023 CLINICAL DATA:  Right ankle pain. EXAM: RIGHT ANKLE - 2 VIEW COMPARISON:  Radiograph 2 days ago. FINDINGS: Postsurgical and degenerative change of the ankle without significant interval change. Plate and screw fixation of the distal fibula traversing a remote distal fibular fracture extending to the hindfoot. Lucency about the proximal fibular plate is unchanged. Syndesmotic screw. Tibial talar fusion which appears solid. No acute fracture, erosive or bony destructive change. IMPRESSION: Postsurgical and degenerative change of the ankle without significant interval change. Lucency about the proximal fibular plate is unchanged may represent loosening. Recommend orthopedic follow-up given persistent or recurrent pain. Electronically Signed   By: Chadwick Colonel M.D.   On: 10/23/2023 17:34    Procedures Procedures    Medications Ordered in ED Medications  acetaminophen  (TYLENOL ) tablet 1,000 mg (1,000 mg Oral Given 10/24/23 1314)  ibuprofen  (ADVIL ) tablet 400 mg (400 mg Oral Given 10/24/23 1338)    ED Course/ Medical Decision Making/ A&P                                 Medical Decision Making Risk OTC drugs.   This patient presents to the ED for concern of ankle pain, this involves an extensive number of treatment options, and is a complaint that carries with it a high risk of complications and morbidity.  The differential diagnosis includes hemarthrosis, gout, septic joint, fracture, tendonitis, muscle strain, bursitis, compartment syndrome   Co morbidities that complicate the patient evaluation  CHF, CAD, DM, HTN,  post-traumatic osteoarthritis of right ankle, polysubstance abuse, afib, CKD, HLD, COPD   Additional history obtained:  Additional history obtained from 5/11 ankle xray: Postsurgical and degenerative change of the ankle without significant interval change.  Lucency around the proximal fibular plate is unchanged may represent loosening.  Recommend orthopedic follow-up given the persistent or recurrent pain.   Problem List / ED Course / Critical interventions / Medication management  Presents ED concern for his chronic right ankle pain.  Patient requesting taller crutches.  Patient  seen yesterday for same complaint and states that his pain resolved with the Advil  and Tylenol  given to him.  X-ray yesterday without acute concern.  Patient educated on x-ray results and the need to follow-up with his orthopedic provider.  Patient verbalized understanding of plan.  Provided patient with another dose of Advil  and Tylenol  today.  Provided patient with taller crutches.  Patient initially had tachycardia which resolved with 2 cups of PO water .  Rest of physical exam reassuring.  Patient afebrile with stable vitals. Staffed with Dr. Bryna Car who agrees with plan.  I have reviewed the patients home medicines and have made adjustments as needed The patient has been appropriately medically screened and/or stabilized in the ED. I have low suspicion for any other emergent medical condition which would require further screening, evaluation or treatment in the ED or require inpatient management. At time of discharge the patient is hemodynamically stable and in no acute distress. I have discussed work-up results and diagnosis with patient and answered all questions. Patient is agreeable with discharge plan. We discussed strict return precautions for returning to the emergency department and they verbalized understanding.     Ddx these are considered less likely due to history of present illness and physical  exam -hemarthrosis: joint without swelling; ROM intact -gout: no warmth or erythema; ROM intact  -septic joint: afebrile; no warmth or erythema; no skin changes; ROM intact  -fracture: xray without concern  -compartment syndrome: area not tense; neurovascularly intact   Social Determinants of Health:  homelessness         Final Clinical Impression(s) / ED Diagnoses Final diagnoses:  Chronic pain of right ankle    Rx / DC Orders ED Discharge Orders     None         [place], [place] 10/24/23 1443    Cheyenne Cotta, MD 10/24/23 1820

## 2023-10-24 NOTE — ED Notes (Signed)
 Pt refused ankle brace, crutches dispensed

## 2023-10-25 ENCOUNTER — Encounter (HOSPITAL_COMMUNITY): Payer: Self-pay

## 2023-10-25 ENCOUNTER — Emergency Department (HOSPITAL_COMMUNITY)

## 2023-10-25 ENCOUNTER — Emergency Department (HOSPITAL_COMMUNITY)
Admission: EM | Admit: 2023-10-25 | Discharge: 2023-10-25 | Disposition: A | Discharge status: Home / Self Care | Attending: Emergency Medicine | Admitting: Emergency Medicine

## 2023-10-25 ENCOUNTER — Other Ambulatory Visit: Payer: Self-pay

## 2023-10-25 DIAGNOSIS — I509 Heart failure, unspecified: Secondary | ICD-10-CM | POA: Insufficient documentation

## 2023-10-25 DIAGNOSIS — I110 Hypertensive heart disease with heart failure: Secondary | ICD-10-CM | POA: Insufficient documentation

## 2023-10-25 DIAGNOSIS — Z79899 Other long term (current) drug therapy: Secondary | ICD-10-CM | POA: Insufficient documentation

## 2023-10-25 DIAGNOSIS — R053 Chronic cough: Secondary | ICD-10-CM | POA: Insufficient documentation

## 2023-10-25 DIAGNOSIS — E119 Type 2 diabetes mellitus without complications: Secondary | ICD-10-CM | POA: Insufficient documentation

## 2023-10-25 DIAGNOSIS — G8929 Other chronic pain: Secondary | ICD-10-CM | POA: Insufficient documentation

## 2023-10-25 DIAGNOSIS — I2510 Atherosclerotic heart disease of native coronary artery without angina pectoris: Secondary | ICD-10-CM | POA: Insufficient documentation

## 2023-10-25 DIAGNOSIS — L97319 Non-pressure chronic ulcer of right ankle with unspecified severity: Secondary | ICD-10-CM | POA: Insufficient documentation

## 2023-10-25 DIAGNOSIS — Z7984 Long term (current) use of oral hypoglycemic drugs: Secondary | ICD-10-CM | POA: Insufficient documentation

## 2023-10-25 DIAGNOSIS — Z7901 Long term (current) use of anticoagulants: Secondary | ICD-10-CM | POA: Insufficient documentation

## 2023-10-25 DIAGNOSIS — Z7982 Long term (current) use of aspirin: Secondary | ICD-10-CM | POA: Insufficient documentation

## 2023-10-25 LAB — CBC WITH DIFFERENTIAL/PLATELET
Abs Immature Granulocytes: 0.01 10*3/uL (ref 0.00–0.07)
Basophils Absolute: 0.1 10*3/uL (ref 0.0–0.1)
Basophils Relative: 1 %
Eosinophils Absolute: 0.4 10*3/uL (ref 0.0–0.5)
Eosinophils Relative: 5 %
HCT: 38.5 % — ABNORMAL LOW (ref 39.0–52.0)
Hemoglobin: 12.2 g/dL — ABNORMAL LOW (ref 13.0–17.0)
Immature Granulocytes: 0 %
Lymphocytes Relative: 37 %
Lymphs Abs: 2.5 10*3/uL (ref 0.7–4.0)
MCH: 30.9 pg (ref 26.0–34.0)
MCHC: 31.7 g/dL (ref 30.0–36.0)
MCV: 97.5 fL (ref 80.0–100.0)
Monocytes Absolute: 0.8 10*3/uL (ref 0.1–1.0)
Monocytes Relative: 12 %
Neutro Abs: 3.1 10*3/uL (ref 1.7–7.7)
Neutrophils Relative %: 45 %
Platelets: 266 10*3/uL (ref 150–400)
RBC: 3.95 MIL/uL — ABNORMAL LOW (ref 4.22–5.81)
RDW: 14.7 % (ref 11.5–15.5)
WBC: 6.8 10*3/uL (ref 4.0–10.5)
nRBC: 0 % (ref 0.0–0.2)

## 2023-10-25 LAB — BASIC METABOLIC PANEL WITH GFR
Anion gap: 10 (ref 5–15)
BUN: 21 mg/dL (ref 8–23)
CO2: 20 mmol/L — ABNORMAL LOW (ref 22–32)
Calcium: 9.7 mg/dL (ref 8.9–10.3)
Chloride: 107 mmol/L (ref 98–111)
Creatinine, Ser: 1.34 mg/dL — ABNORMAL HIGH (ref 0.61–1.24)
GFR, Estimated: 58 mL/min — ABNORMAL LOW (ref >60)
Glucose, Bld: 136 mg/dL — ABNORMAL HIGH (ref 70–99)
Potassium: 3.9 mmol/L (ref 3.5–5.1)
Sodium: 137 mmol/L (ref 135–145)

## 2023-10-25 LAB — CBG MONITORING, ED: Glucose-Capillary: 123 mg/dL — ABNORMAL HIGH (ref 70–99)

## 2023-10-25 LAB — RESP PANEL BY RT-PCR (RSV, FLU A&B, COVID)  RVPGX2
Influenza A by PCR: NEGATIVE
Influenza B by PCR: NEGATIVE
Resp Syncytial Virus by PCR: NEGATIVE
SARS Coronavirus 2 by RT PCR: NEGATIVE

## 2023-10-25 MED ORDER — ACETAMINOPHEN 500 MG PO TABS
1000.0000 mg | ORAL_TABLET | Freq: Once | ORAL | Status: AC
  Administered 2023-10-25: 1000 mg via ORAL
  Filled 2023-10-25: qty 2

## 2023-10-25 MED ORDER — IBUPROFEN 200 MG PO TABS
400.0000 mg | ORAL_TABLET | Freq: Once | ORAL | Status: AC
  Administered 2023-10-25: 400 mg via ORAL
  Filled 2023-10-25: qty 2

## 2023-10-25 NOTE — ED Triage Notes (Signed)
 Pt to er via ems, per ems pt is here for bilateral foot pain.  Pt states that "I got trouble in that Right foot there" pt c/o foot pain, states that they have hurt for the past week and a half

## 2023-10-25 NOTE — ED Provider Notes (Signed)
 [place] EMERGENCY DEPARTMENT AT [facility] Provider Note   CSN: [id number] Arrival date & time: 10/25/23  1843     History  Chief Complaint  Patient presents with   Foot Pain    Jeremy [name] is a 68 y.o. male with PMHx CHF, CAD, DM, HTN, polysubstance use, afib, GERD, HLD who presents to ED concerned for chronic right foot pain. Patient seen daily for the past many days for same complaint. Patient denies any new trauma. Patient has not taken anything for pain today. Patient stating that pain was relieved with Tylenol  and Advil  yesterday. I asked if patient called his ortho provider as I requested him to do yesterday - he did not. Patient was given better fitting crutches yesterday.  Of note, patient also with cough. Patient continues to joke around rather than answering how long the cough has been present for - it appears that cough has been present for a long time. Patient endorses green sputum. Denies fever, chest pain, SOB, nausea, vomiting, diarrhea.    Foot Pain       Home Medications Prior to Admission medications   Medication Sig Start Date End Date Taking? Authorizing Provider  acetaminophen  (TYLENOL ) 500 MG tablet Take 1 tablet (500 mg total) by mouth every 6 (six) hours as needed. 10/23/23   Nathanael Baker, DO  amiodarone  (PACERONE ) 200 MG tablet Take 1 tablet (200 mg total) by mouth daily. 10/20/23 01/19/24  Kandee Orion, MD  aspirin  81 MG chewable tablet Chew 1 tablet (81 mg total) by mouth daily. 10/20/23 02/17/24  Kandee Orion, MD  atorvastatin  (LIPITOR) 40 MG tablet Take 1 tablet (40 mg total) by mouth daily. 10/20/23   Kandee Orion, MD  azithromycin  (ZITHROMAX ) 250 MG tablet Take 1 tablet (250 mg total) by mouth daily. 10/03/23   Ruth Cove, MD  carvedilol  (COREG ) 3.125 MG tablet Take 1 tablet (3.125 mg total) by mouth 2 (two) times daily. Reduced from 6.25 mg. 10/20/23 01/19/24  Kandee Orion, MD  guaiFENesin -codeine  100-10 MG/5ML syrup Take 5  mLs by mouth every 6 (six) hours as needed for cough. 10/11/23   Ruth Cove, MD  metFORMIN  (GLUCOPHAGE ) 1000 MG tablet Take 1 tablet (1,000 mg total) by mouth once daily with breakfast. 10/20/23 01/19/24  Kandee Orion, MD  nitroGLYCERIN  (NITROSTAT ) 0.4 MG SL tablet Place 1 tablet (0.4 mg total) under the tongue every 5 (five) minutes as needed for chest pain. 11/26/22   Alexander, Natalie, DO  pantoprazole  (PROTONIX ) 20 MG tablet Take 1 tablet (20 mg total) by mouth daily. 06/28/23   Alexander, Natalie, DO  rivaroxaban  (XARELTO ) 20 MG TABS tablet Take 1 tablet (20 mg total) by mouth daily at 5 pm 10/20/23 01/19/24  Kandee Orion, MD  sacubitril -valsartan  (ENTRESTO ) 24-26 MG Take 1 tablet by mouth every 12 (twelve) hours. 06/28/23   Alexander, Natalie, DO  torsemide  (DEMADEX ) 20 MG tablet Take 1 tablet (20 mg total) by mouth daily. 06/28/23   Alexander, Natalie, DO  traMADol  (ULTRAM ) 50 MG tablet Take 1 tablet (50 mg total) by mouth daily as needed for pain. 06/03/23         Allergies    Patient has no known allergies.    Review of Systems   Review of Systems  Musculoskeletal:        Ankle pain    Physical Exam Updated Vital Signs BP 138/75 (BP Location: Left Arm)   Pulse 83   Temp 97.9 F (36.6 C) (  Oral)   Resp 16   Ht 5\' 11"  (1.803 m)   Wt 99.8 kg   SpO2 100%   BMI 30.68 kg/m  Physical Exam Vitals and nursing note reviewed.  Constitutional:      General: He is not in acute distress.    Appearance: He is not ill-appearing or toxic-appearing.  HENT:     Head: Normocephalic and atraumatic.     Mouth/Throat:     Mouth: Mucous membranes are moist.  Eyes:     General: No scleral icterus.       Right eye: No discharge.        Left eye: No discharge.     Conjunctiva/sclera: Conjunctivae normal.  Cardiovascular:     Rate and Rhythm: Normal rate and regular rhythm.     Pulses: Normal pulses.     Heart sounds: Normal heart sounds. No murmur heard. Pulmonary:     Effort:  Pulmonary effort is normal. No respiratory distress.     Breath sounds: Normal breath sounds. No wheezing, rhonchi or rales.  Abdominal:     General: Abdomen is flat. Bowel sounds are normal. There is no distension.     Palpations: Abdomen is soft. There is no mass.     Tenderness: There is no abdominal tenderness.  Musculoskeletal:     Right lower leg: No edema.     Left lower leg: No edema.     Comments: Right ankle with well healing lateral ulcer. Mild to moderately swollen. ROM intact. No erythema or increased warmth. +2 pedal pulse. Sensation to light touch intact. Area non-tense.    Skin:    General: Skin is warm and dry.     Findings: No rash.  Neurological:     General: No focal deficit present.     Mental Status: He is alert and oriented to person, place, and time. Mental status is at baseline.  Psychiatric:        Mood and Affect: Mood normal.        Behavior: Behavior normal.     ED Results / Procedures / Treatments   Labs (all labs ordered are listed, but only abnormal results are displayed) Labs Reviewed  CBG MONITORING, ED - Abnormal; Notable for the following components:      Result Value   Glucose-Capillary 123 (*)    All other components within normal limits  RESP PANEL BY RT-PCR (RSV, FLU A&B, COVID)  [id number]  CBC WITH DIFFERENTIAL/PLATELET  BASIC METABOLIC PANEL WITH GFR    EKG None  Radiology DG Chest 2 View Result Date: 10/25/2023 CLINICAL DATA:  cough EXAM: CHEST - 2 VIEW COMPARISON:  Chest x-ray 10/20/2023 FINDINGS: The heart and mediastinal contours are unchanged. Atherosclerotic plaque No focal consolidation. No pulmonary edema. No pleural effusion. No pneumothorax. No acute osseous abnormality. Sternotomy wires are intact. Bilateral shoulder degenerative changes. IMPRESSION: 1. No active cardiopulmonary disease. 2.  Aortic Atherosclerosis (ICD10-I70.0). Electronically Signed   By: Morgane  Naveau M.D.   On: 10/25/2023 21:21    Procedures Procedures     Medications Ordered in ED Medications  acetaminophen  (TYLENOL ) tablet 1,000 mg (has no administration in time range)  ibuprofen  (ADVIL ) tablet 400 mg (has no administration in time range)    ED Course/ Medical Decision Making/ A&P                                 Medical Decision Making Amount and/or Complexity  of Data Reviewed Labs: ordered. Radiology: ordered.   This patient presents to the ED for concern of ankle pain and cough, this involves an extensive number of treatment options, and is a complaint that carries with it a high risk of complications and morbidity.  The differential diagnosis includes hemarthrosis, gout, septic joint, fracture, tendonitis, muscle strain, bursitis, compartment syndrome, PNA, COPD, viral URI   Co morbidities that complicate the patient evaluation  CHF, CAD, DM, HTN, polysubstance use, afib, GERD, HLD   Additional history obtained:  Additional history obtained from 5/11 ankle xray: Postsurgical and degenerative change of the ankle without significant interval change.  Lucency around the proximal fibular plate is unchanged may represent loosening.  Recommend orthopedic follow-up given the persistent or recurrent pain.   Problem List / ED Course / Critical interventions / Medication management  Patient presents to ED concern for right ankle pain.  Patient seen multiple times in ED for same complaint.  Physical exam today reassuring.  Patient afebrile with stable vitals. Of note, patient also complaining of his cough.  It appears that he has had his cough for a long time.  Lungs CTABL.  Patient declines fever, chest pain, shortness of breath or any other infectious symptoms today. Provided patient with his daily dose of Tylenol  and Advil . Will get lab workup and chest xray to assess for PNA.  I ordered imaging studies including chest xray. I independently visualized and interpreted imaging. I agree with the radiologist interpretation of no acute  process.  I have reviewed the patients home medicines and have made adjustments as needed   Social Determinants of Health:  homeless   10PM Care of Michaellee Weider transferred to PA Lauren at the end of my shift as the patient will require reassessment once labs/imaging have resulted. Patient presentation, ED course, and plan of care discussed with review of all pertinent labs and imaging. Please see his/her note for further details regarding further ED course and disposition. Plan at time of handoff is reassess patient after lab workup. I expect that he should be appropriate for outpatient follow up with his orthopedic provider if workup today is reassuring.  This may be altered or completely changed at the discretion of the oncoming team pending results of further workup.         Final Clinical Impression(s) / ED Diagnoses Final diagnoses:  Chronic pain of right ankle  Chronic cough    Rx / DC Orders ED Discharge Orders     None         Don Fritter 10/25/23 2156    Trish Furl, MD 10/27/23 [phone]

## 2023-10-25 NOTE — ED Provider Notes (Signed)
 Received patient follow-up from previous provider pending completion of lab work.  See her note.  In short, patient presents to emergency department for evaluation of chronic right foot pain, cough, green sputum.  Apparently patient has been seen in ED for this right foot pain.  He has not taken anything for pain or called his Ortho provider.  Does not appear infected and there is no new injury.  Patient was provided ibuprofen  and Tylenol  for pain.  A chest x-ray was obtained as he had complaints of cough and green sputum to rule out pneumonia.  Chest x-ray was negative.  Labs unremarkable and per his baseline.  Creatinine 1.34 (baseline 1.53-1.59 over the past 2 months), Hgb 12.2 (baseline 9.9-13.4 over past 2 months), CBG 123  At this time I think that patient is stable for discharge.  There is no emergent cause for symptoms nor reason for admission at this time.  Patient is to follow-up with orthopedist regarding right foot pain.  I reiterated that he needs to follow-up with orthopedics.  Discussed ED workup, disposition, return to ED precautions with patient who expresses understanding agrees with plan.  All questions answered to their satisfaction.  They are agreeable to plan.  Discharge instructions provided on paperwork   Royann Cords, PA 10/25/23 2333    Guadalupe Lee, MD 11/01/23 [phone]

## 2023-10-25 NOTE — Discharge Instructions (Signed)
 Alternating between 650 mg Tylenol and 400 mg Advil : The best way to alternate taking Acetaminophen (example Tylenol) and Ibuprofen  (example Advil /Motrin ) is to take them 3 hours apart. For example, if you take ibuprofen  at 6 am you can then take Tylenol at 9 am. You can continue this regimen throughout the day, making sure you do not exceed the recommended maximum dose for each drug.

## 2023-10-26 ENCOUNTER — Encounter (HOSPITAL_COMMUNITY): Payer: Self-pay

## 2023-10-26 ENCOUNTER — Inpatient Hospital Stay (HOSPITAL_COMMUNITY)
Admission: EM | Admit: 2023-10-26 | Discharge: 2023-11-04 | DRG: 690 | Disposition: A | Discharge status: Skilled Nursing Facility | Attending: Internal Medicine | Admitting: Internal Medicine

## 2023-10-26 DIAGNOSIS — Z8249 Family history of ischemic heart disease and other diseases of the circulatory system: Secondary | ICD-10-CM

## 2023-10-26 DIAGNOSIS — E785 Hyperlipidemia, unspecified: Secondary | ICD-10-CM | POA: Diagnosis present

## 2023-10-26 DIAGNOSIS — Z1612 Extended spectrum beta lactamase (ESBL) resistance: Secondary | ICD-10-CM | POA: Diagnosis present

## 2023-10-26 DIAGNOSIS — B9620 Unspecified Escherichia coli [E. coli] as the cause of diseases classified elsewhere: Secondary | ICD-10-CM | POA: Diagnosis present

## 2023-10-26 DIAGNOSIS — Z794 Long term (current) use of insulin: Secondary | ICD-10-CM

## 2023-10-26 DIAGNOSIS — I5022 Chronic systolic (congestive) heart failure: Secondary | ICD-10-CM | POA: Diagnosis present

## 2023-10-26 DIAGNOSIS — N3090 Cystitis, unspecified without hematuria: Secondary | ICD-10-CM | POA: Diagnosis not present

## 2023-10-26 DIAGNOSIS — T8484XA Pain due to internal orthopedic prosthetic devices, implants and grafts, initial encounter: Secondary | ICD-10-CM | POA: Diagnosis present

## 2023-10-26 DIAGNOSIS — Z7984 Long term (current) use of oral hypoglycemic drugs: Secondary | ICD-10-CM

## 2023-10-26 DIAGNOSIS — I480 Paroxysmal atrial fibrillation: Secondary | ICD-10-CM | POA: Diagnosis not present

## 2023-10-26 DIAGNOSIS — E8721 Acute metabolic acidosis: Secondary | ICD-10-CM | POA: Diagnosis not present

## 2023-10-26 DIAGNOSIS — F17210 Nicotine dependence, cigarettes, uncomplicated: Secondary | ICD-10-CM | POA: Diagnosis present

## 2023-10-26 DIAGNOSIS — Z7901 Long term (current) use of anticoagulants: Secondary | ICD-10-CM

## 2023-10-26 DIAGNOSIS — Z7982 Long term (current) use of aspirin: Secondary | ICD-10-CM

## 2023-10-26 DIAGNOSIS — Z7951 Long term (current) use of inhaled steroids: Secondary | ICD-10-CM

## 2023-10-26 DIAGNOSIS — I130 Hypertensive heart and chronic kidney disease with heart failure and stage 1 through stage 4 chronic kidney disease, or unspecified chronic kidney disease: Secondary | ICD-10-CM | POA: Diagnosis present

## 2023-10-26 DIAGNOSIS — E1122 Type 2 diabetes mellitus with diabetic chronic kidney disease: Secondary | ICD-10-CM

## 2023-10-26 DIAGNOSIS — B9629 Other Escherichia coli [E. coli] as the cause of diseases classified elsewhere: Secondary | ICD-10-CM | POA: Diagnosis present

## 2023-10-26 DIAGNOSIS — I2510 Atherosclerotic heart disease of native coronary artery without angina pectoris: Secondary | ICD-10-CM | POA: Diagnosis not present

## 2023-10-26 DIAGNOSIS — Z981 Arthrodesis status: Secondary | ICD-10-CM

## 2023-10-26 DIAGNOSIS — E119 Type 2 diabetes mellitus without complications: Secondary | ICD-10-CM

## 2023-10-26 DIAGNOSIS — M109 Gout, unspecified: Secondary | ICD-10-CM | POA: Diagnosis present

## 2023-10-26 DIAGNOSIS — Z1152 Encounter for screening for COVID-19: Secondary | ICD-10-CM

## 2023-10-26 DIAGNOSIS — D631 Anemia in chronic kidney disease: Secondary | ICD-10-CM | POA: Diagnosis present

## 2023-10-26 DIAGNOSIS — N1831 Chronic kidney disease, stage 3a: Secondary | ICD-10-CM

## 2023-10-26 DIAGNOSIS — E11610 Type 2 diabetes mellitus with diabetic neuropathic arthropathy: Secondary | ICD-10-CM | POA: Diagnosis present

## 2023-10-26 DIAGNOSIS — I5020 Unspecified systolic (congestive) heart failure: Secondary | ICD-10-CM

## 2023-10-26 DIAGNOSIS — N183 Chronic kidney disease, stage 3 unspecified: Secondary | ICD-10-CM | POA: Diagnosis present

## 2023-10-26 DIAGNOSIS — N390 Urinary tract infection, site not specified: Principal | ICD-10-CM | POA: Diagnosis present

## 2023-10-26 DIAGNOSIS — E66811 Obesity, class 1: Secondary | ICD-10-CM | POA: Diagnosis present

## 2023-10-26 DIAGNOSIS — Z6830 Body mass index (BMI) 30.0-30.9, adult: Secondary | ICD-10-CM

## 2023-10-26 DIAGNOSIS — Z8619 Personal history of other infectious and parasitic diseases: Secondary | ICD-10-CM

## 2023-10-26 DIAGNOSIS — Y831 Surgical operation with implant of artificial internal device as the cause of abnormal reaction of the patient, or of later complication, without mention of misadventure at the time of the procedure: Secondary | ICD-10-CM | POA: Diagnosis present

## 2023-10-26 DIAGNOSIS — G8929 Other chronic pain: Secondary | ICD-10-CM | POA: Diagnosis present

## 2023-10-26 DIAGNOSIS — I10 Essential (primary) hypertension: Secondary | ICD-10-CM | POA: Diagnosis present

## 2023-10-26 DIAGNOSIS — M25571 Pain in right ankle and joints of right foot: Secondary | ICD-10-CM | POA: Diagnosis present

## 2023-10-26 DIAGNOSIS — Z751 Person awaiting admission to adequate facility elsewhere: Secondary | ICD-10-CM

## 2023-10-26 DIAGNOSIS — M14671 Charcot's joint, right ankle and foot: Secondary | ICD-10-CM | POA: Diagnosis present

## 2023-10-26 DIAGNOSIS — Z5900 Homelessness unspecified: Secondary | ICD-10-CM

## 2023-10-26 DIAGNOSIS — E875 Hyperkalemia: Secondary | ICD-10-CM | POA: Diagnosis present

## 2023-10-26 DIAGNOSIS — N179 Acute kidney failure, unspecified: Secondary | ICD-10-CM | POA: Diagnosis present

## 2023-10-26 DIAGNOSIS — E1165 Type 2 diabetes mellitus with hyperglycemia: Secondary | ICD-10-CM | POA: Diagnosis present

## 2023-10-26 DIAGNOSIS — Z79899 Other long term (current) drug therapy: Secondary | ICD-10-CM

## 2023-10-26 DIAGNOSIS — Z951 Presence of aortocoronary bypass graft: Secondary | ICD-10-CM

## 2023-10-26 LAB — URINALYSIS, W/ REFLEX TO CULTURE (INFECTION SUSPECTED)
Bilirubin Urine: NEGATIVE
Glucose, UA: NEGATIVE mg/dL
Ketones, ur: NEGATIVE mg/dL
Nitrite: NEGATIVE
Protein, ur: NEGATIVE mg/dL
Specific Gravity, Urine: 1.013 (ref 1.005–1.030)
pH: 5 (ref 5.0–8.0)

## 2023-10-26 LAB — LACTIC ACID, PLASMA: Lactic Acid, Venous: 1.3 mmol/L (ref 0.5–1.9)

## 2023-10-26 LAB — CBC WITH DIFFERENTIAL/PLATELET
Abs Immature Granulocytes: 0.01 10*3/uL (ref 0.00–0.07)
Basophils Absolute: 0.1 10*3/uL (ref 0.0–0.1)
Basophils Relative: 1 %
Eosinophils Absolute: 0.4 10*3/uL (ref 0.0–0.5)
Eosinophils Relative: 6 %
HCT: 37.5 % — ABNORMAL LOW (ref 39.0–52.0)
Hemoglobin: 11.9 g/dL — ABNORMAL LOW (ref 13.0–17.0)
Immature Granulocytes: 0 %
Lymphocytes Relative: 37 %
Lymphs Abs: 2.4 10*3/uL (ref 0.7–4.0)
MCH: 31 pg (ref 26.0–34.0)
MCHC: 31.7 g/dL (ref 30.0–36.0)
MCV: 97.7 fL (ref 80.0–100.0)
Monocytes Absolute: 0.9 10*3/uL (ref 0.1–1.0)
Monocytes Relative: 14 %
Neutro Abs: 2.7 10*3/uL (ref 1.7–7.7)
Neutrophils Relative %: 42 %
Platelets: 253 10*3/uL (ref 150–400)
RBC: 3.84 MIL/uL — ABNORMAL LOW (ref 4.22–5.81)
RDW: 14.8 % (ref 11.5–15.5)
WBC: 6.6 10*3/uL (ref 4.0–10.5)
nRBC: 0 % (ref 0.0–0.2)

## 2023-10-26 LAB — COMPREHENSIVE METABOLIC PANEL WITH GFR
ALT: 10 U/L (ref 0–44)
AST: 18 U/L (ref 15–41)
Albumin: 3.5 g/dL (ref 3.5–5.0)
Alkaline Phosphatase: 70 U/L (ref 38–126)
Anion gap: 6 (ref 5–15)
BUN: 23 mg/dL (ref 8–23)
CO2: 26 mmol/L (ref 22–32)
Calcium: 9.8 mg/dL (ref 8.9–10.3)
Chloride: 107 mmol/L (ref 98–111)
Creatinine, Ser: 1.53 mg/dL — ABNORMAL HIGH (ref 0.61–1.24)
GFR, Estimated: 50 mL/min — ABNORMAL LOW (ref >60)
Glucose, Bld: 100 mg/dL — ABNORMAL HIGH (ref 70–99)
Potassium: 5.5 mmol/L — ABNORMAL HIGH (ref 3.5–5.1)
Sodium: 139 mmol/L (ref 135–145)
Total Bilirubin: 0.9 mg/dL (ref 0.0–1.2)
Total Protein: 7.6 g/dL (ref 6.5–8.1)

## 2023-10-26 LAB — CBG MONITORING, ED: Glucose-Capillary: 176 mg/dL — ABNORMAL HIGH (ref 70–99)

## 2023-10-26 MED ORDER — PIPERACILLIN-TAZOBACTAM 3.375 G IVPB
3.3750 g | Freq: Three times a day (TID) | INTRAVENOUS | Status: AC
  Administered 2023-10-27 – 2023-11-01 (×18): 3.375 g via INTRAVENOUS
  Filled 2023-10-26 (×18): qty 50

## 2023-10-26 MED ORDER — SODIUM CHLORIDE 0.9 % IV SOLN: 1.0000 g | Freq: Once | INTRAVENOUS | Status: DC

## 2023-10-26 MED ORDER — ACETAMINOPHEN 650 MG RE SUPP: 650.0000 mg | Freq: Four times a day (QID) | RECTAL | Status: DC | PRN

## 2023-10-26 MED ORDER — RIVAROXABAN 10 MG PO TABS
20.0000 mg | ORAL_TABLET | Freq: Every day | ORAL | Status: DC
  Administered 2023-10-27 – 2023-11-04 (×9): 20 mg via ORAL
  Filled 2023-10-26 (×9): qty 2

## 2023-10-26 MED ORDER — AMIODARONE HCL 200 MG PO TABS
200.0000 mg | ORAL_TABLET | Freq: Every day | ORAL | Status: DC
  Administered 2023-10-27 – 2023-11-04 (×9): 200 mg via ORAL
  Filled 2023-10-26 (×9): qty 1

## 2023-10-26 MED ORDER — SENNOSIDES-DOCUSATE SODIUM 8.6-50 MG PO TABS: 1.0000 | ORAL_TABLET | Freq: Every evening | ORAL | Status: DC | PRN

## 2023-10-26 MED ORDER — INSULIN ASPART 100 UNIT/ML IJ SOLN
0.0000 [IU] | Freq: Three times a day (TID) | INTRAMUSCULAR | Status: DC
  Administered 2023-10-27 – 2023-10-31 (×5): 1 [IU] via SUBCUTANEOUS
  Filled 2023-10-26: qty 0.06

## 2023-10-26 MED ORDER — OXYCODONE HCL 5 MG PO TABS: 5.0000 mg | ORAL_TABLET | ORAL | Status: DC | PRN

## 2023-10-26 MED ORDER — CARVEDILOL 3.125 MG PO TABS
3.1250 mg | ORAL_TABLET | Freq: Two times a day (BID) | ORAL | Status: DC
  Administered 2023-10-26 – 2023-11-04 (×18): 3.125 mg via ORAL
  Filled 2023-10-26 (×18): qty 1

## 2023-10-26 MED ORDER — INSULIN ASPART 100 UNIT/ML IJ SOLN
0.0000 [IU] | Freq: Every day | INTRAMUSCULAR | Status: DC
  Filled 2023-10-26: qty 0.05

## 2023-10-26 MED ORDER — ACETAMINOPHEN 325 MG PO TABS
650.0000 mg | ORAL_TABLET | Freq: Four times a day (QID) | ORAL | Status: DC | PRN
  Administered 2023-11-03: 650 mg via ORAL
  Filled 2023-10-26: qty 2

## 2023-10-26 MED ORDER — PIPERACILLIN-TAZOBACTAM 3.375 G IVPB 30 MIN
3.3750 g | Freq: Once | INTRAVENOUS | Status: AC
  Administered 2023-10-26: 3.375 g via INTRAVENOUS
  Filled 2023-10-26: qty 50

## 2023-10-26 MED ORDER — ATORVASTATIN CALCIUM 40 MG PO TABS
40.0000 mg | ORAL_TABLET | Freq: Every day | ORAL | Status: DC
  Administered 2023-10-27 – 2023-11-04 (×9): 40 mg via ORAL
  Filled 2023-10-26 (×9): qty 1

## 2023-10-26 MED ORDER — ASPIRIN 81 MG PO CHEW
81.0000 mg | CHEWABLE_TABLET | Freq: Every day | ORAL | Status: DC
  Administered 2023-10-27 – 2023-11-04 (×9): 81 mg via ORAL
  Filled 2023-10-26 (×9): qty 1

## 2023-10-26 NOTE — ED Triage Notes (Signed)
 Pt was bib EMS from [facility]. EMS reports a smell of infection. Swelling and pain in R ankle has been a week. Was seen here yesterday. HR 126 CBG 145 rest of vitals WDL

## 2023-10-26 NOTE — H&P (Signed)
 History and Physical    Savior Basnight ZOX:[id number] DOB: 1956-02-27 DOA: 10/26/2023  PCP: Antonio Baumgarten, MD   Patient coming from: Home   Chief Complaint: Fatigue, malaise, chronic right ankle pain   HPI: Jeremy [name] is a 68 y.o. male with medical history significant for coronary artery disease status post CABG, chronic HFrEF, type 2 diabetes mellitus, hypertension, CKD 3A, and atrial fibrillation on Xarelto  who presents with fatigue, general malaise, and chronic right ankle pain.  Patient is a difficult historian, preferring not to answer questions.  He does relate that he has been feeling generally poor with increased fatigue, and general malaise.  He complains of right foot and ankle pain that has been bothering him for years.  He denies fevers or chills.  He initially denies dysuria but later indicates that he has been experiencing dysuria.  He denies flank pain.  ED Course: Upon arrival to the ED, patient is found to be afebrile and saturating well on room air with mild tachycardia and stable BP.  Labs are most notable for potassium 5.5, creatinine 1.53, normal WBC, and normal lactate.  Blood and urine cultures were collected in the ED and the patient was given a dose of Zosyn .  Review of Systems:  All other systems reviewed and apart from HPI, are negative.  Past Medical History:  Diagnosis Date   CHF (congestive heart failure) (HCC)    Coronary artery disease    Diabetes mellitus without complication (HCC)    Hypertension    S/P CABG x 1     Past Surgical History:  Procedure Laterality Date   ANKLE ARTHROSCOPY W/ OPEN REPAIR Right    CORONARY ARTERY BYPASS GRAFT     PULMONARY THROMBECTOMY Bilateral 12/24/2020   Procedure: PULMONARY THROMBECTOMY;  Surgeon: Celso College, MD;  Location: [facility];  Service: Cardiovascular;  Laterality: Bilateral;    Social History:   reports that he has been smoking cigarettes. He has never used smokeless tobacco. He  reports current alcohol  use of about 3.0 standard drinks of alcohol  per week. He reports that he does not currently use drugs after having used the following drugs: Marijuana.  No Known Allergies  Family History  Problem Relation Age of Onset   Heart attack Father      Prior to Admission medications   Medication Sig Start Date End Date Taking? Authorizing Provider  acetaminophen  (TYLENOL ) 500 MG tablet Take 1 tablet (500 mg total) by mouth every 6 (six) hours as needed. 10/23/23   Nathanael Baker, DO  amiodarone  (PACERONE ) 200 MG tablet Take 1 tablet (200 mg total) by mouth daily. 10/20/23 01/19/24  Kandee Orion, MD  aspirin  81 MG chewable tablet Chew 1 tablet (81 mg total) by mouth daily. 10/20/23 02/17/24  Kandee Orion, MD  atorvastatin  (LIPITOR) 40 MG tablet Take 1 tablet (40 mg total) by mouth daily. 10/20/23   Kandee Orion, MD  azithromycin  (ZITHROMAX ) 250 MG tablet Take 1 tablet (250 mg total) by mouth daily. 10/03/23   Ruth Cove, MD  carvedilol  (COREG ) 3.125 MG tablet Take 1 tablet (3.125 mg total) by mouth 2 (two) times daily. Reduced from 6.25 mg. 10/20/23 01/19/24  Kandee Orion, MD  guaiFENesin -codeine  100-10 MG/5ML syrup Take 5 mLs by mouth every 6 (six) hours as needed for cough. 10/11/23   Ruth Cove, MD  metFORMIN  (GLUCOPHAGE ) 1000 MG tablet Take 1 tablet (1,000 mg total) by mouth once daily with breakfast. 10/20/23 01/19/24  Kandee Orion,  MD  nitroGLYCERIN  (NITROSTAT ) 0.4 MG SL tablet Place 1 tablet (0.4 mg total) under the tongue every 5 (five) minutes as needed for chest pain. 11/26/22   Alexander, Natalie, DO  pantoprazole  (PROTONIX ) 20 MG tablet Take 1 tablet (20 mg total) by mouth daily. 06/28/23  Yes Alexander, Natalie, DO  rivaroxaban  (XARELTO ) 20 MG TABS tablet Take 1 tablet (20 mg total) by mouth daily at 5 pm 10/20/23 01/19/24  Kandee Orion, MD  sacubitril -valsartan  (ENTRESTO ) 24-26 MG Take 1 tablet by mouth every 12 (twelve) hours. 06/28/23   Alexander, Natalie, DO   torsemide  (DEMADEX ) 20 MG tablet Take 1 tablet (20 mg total) by mouth daily. 06/28/23   Alexander, Natalie, DO  traMADol  (ULTRAM ) 50 MG tablet Take 1 tablet (50 mg total) by mouth daily as needed for pain. 06/03/23       Physical Exam: Vitals:   10/26/23 1627 10/26/23 1628 10/26/23 1834 10/26/23 2016  BP: 112/70  114/65 104/62  Pulse: (!) 109  82 (!) 118  Resp: 18  18 18   Temp:  98.2 F (36.8 C)  97.7 F (36.5 C)  TempSrc:  Oral  Axillary  SpO2: 96%  93% 100%  Weight:      Height:        Constitutional: NAD, no pallor or diaphoresis   Eyes: PERTLA, lids and conjunctivae normal ENMT: Mucous membranes are moist. Posterior pharynx clear of any exudate or lesions.   Neck: supple, no masses  Respiratory: no wheezing, no crackles. No accessory muscle use.  Cardiovascular: S1 & S2 heard, regular rate and rhythm. No JVD. Abdomen: No distension, no tenderness, soft. Bowel sounds active.  Musculoskeletal: no clubbing / cyanosis. Right ankle swelling and tenderness without erythema, heat, or drainage.   Skin: no significant rashes, lesions, ulcers. Warm, dry, well-perfused. Neurologic: CN 2-12 grossly intact. Moving all extremities. Sleeping. Wakes to voice and oriented to person, place, and situation.     Labs and Imaging on Admission: I have personally reviewed following labs and imaging studies  CBC: Recent Labs  Lab 10/20/23 1748 10/21/23 1807 10/25/23 2241 10/26/23 1725  WBC 5.5 7.3 6.8 6.6  NEUTROABS  --  3.5 3.1 2.7  HGB 12.0* 13.4 12.2* 11.9*  HCT 38.3* 42.1 38.5* 37.5*  MCV 98.2 99.3 97.5 97.7  PLT 282 250 266 253   Basic Metabolic Panel: Recent Labs  Lab 10/20/23 1748 10/21/23 1807 10/25/23 2241 10/26/23 1725  NA 140 138 137 139  K 4.4 4.4 3.9 5.5*  CL 106 107 107 107  CO2 24 20* 20* 26  GLUCOSE 87 90 136* 100*  BUN 16 19 21 23   CREATININE 1.57* 1.53* 1.34* 1.53*  CALCIUM  9.5 9.7 9.7 9.8   GFR: Estimated Creatinine Clearance: 57.2 mL/min (A) (by C-G  formula based on SCr of 1.53 mg/dL (H)). Liver Function Tests: Recent Labs  Lab 10/26/23 1725  AST 18  ALT 10  ALKPHOS 70  BILITOT 0.9  PROT 7.6  ALBUMIN 3.5   No results for input(s): "LIPASE", "AMYLASE" in the last 168 hours. No results for input(s): "AMMONIA" in the last 168 hours. Coagulation Profile: No results for input(s): "INR", "PROTIME" in the last 168 hours. Cardiac Enzymes: No results for input(s): "CKTOTAL", "CKMB", "CKMBINDEX", "TROPONINI" in the last 168 hours. BNP (last 3 results) No results for input(s): "PROBNP" in the last 8760 hours. HbA1C: No results for input(s): "HGBA1C" in the last 72 hours. CBG: Recent Labs  Lab 10/25/23 1853  GLUCAP 123*   Lipid  Profile: No results for input(s): "CHOL", "HDL", "LDLCALC", "TRIG", "CHOLHDL", "LDLDIRECT" in the last 72 hours. Thyroid  Function Tests: No results for input(s): "TSH", "T4TOTAL", "FREET4", "T3FREE", "THYROIDAB" in the last 72 hours. Anemia Panel: No results for input(s): "VITAMINB12", "FOLATE", "FERRITIN", "TIBC", "IRON", "RETICCTPCT" in the last 72 hours. Urine analysis:    Component Value Date/Time   COLORURINE YELLOW 10/26/2023 1850   APPEARANCEUR CLEAR 10/26/2023 1850   LABSPEC 1.013 10/26/2023 1850   PHURINE 5.0 10/26/2023 1850   GLUCOSEU NEGATIVE 10/26/2023 1850   HGBUR SMALL (A) 10/26/2023 1850   BILIRUBINUR NEGATIVE 10/26/2023 1850   KETONESUR NEGATIVE 10/26/2023 1850   PROTEINUR NEGATIVE 10/26/2023 1850   NITRITE NEGATIVE 10/26/2023 1850   LEUKOCYTESUR MODERATE (A) 10/26/2023 1850   Sepsis Labs: @LABRCNTIP (procalcitonin:4,lacticidven:4) ) Recent Results (from the past 240 hours)  Resp panel by RT-PCR (RSV, Flu A&B, Covid) Anterior Nasal Swab     Status: None   Collection Time: 10/25/23 10:42 PM   Specimen: Anterior Nasal Swab  Result Value Ref Range Status   SARS Coronavirus 2 by RT PCR NEGATIVE NEGATIVE Final    Comment: (NOTE) SARS-CoV-2 target nucleic acids are NOT  DETECTED.  The SARS-CoV-2 RNA is generally detectable in upper respiratory specimens during the acute phase of infection. The lowest concentration of SARS-CoV-2 viral copies this assay can detect is 138 copies/mL. A negative result does not preclude SARS-Cov-2 infection and should not be used as the sole basis for treatment or other patient management decisions. A negative result may occur with  improper specimen collection/handling, submission of specimen other than nasopharyngeal swab, presence of viral mutation(s) within the areas targeted by this assay, and inadequate number of viral copies(<138 copies/mL). A negative result must be combined with clinical observations, patient history, and epidemiological information. The expected result is Negative.  Fact Sheet for Patients:  BloggerCourse.com  Fact Sheet for Healthcare Providers:  SeriousBroker.it  This test is no t yet approved or cleared by the [place]  FDA and  has been authorized for detection and/or diagnosis of SARS-CoV-2 by FDA under an Emergency Use Authorization (EUA). This EUA will remain  in effect (meaning this test can be used) for the duration of the COVID-19 declaration under Section 564(b)(1) of the Act, 21 U.S.C.section 360bbb-3(b)(1), unless the authorization is terminated  or revoked sooner.       Influenza A by PCR NEGATIVE NEGATIVE Final   Influenza B by PCR NEGATIVE NEGATIVE Final    Comment: (NOTE) The Xpert Xpress SARS-CoV-2/FLU/RSV plus assay is intended as an aid in the diagnosis of influenza from Nasopharyngeal swab specimens and should not be used as a sole basis for treatment. Nasal washings and aspirates are unacceptable for Xpert Xpress SARS-CoV-2/FLU/RSV testing.  Fact Sheet for Patients: BloggerCourse.com  Fact Sheet for Healthcare Providers: SeriousBroker.it  This test is not yet  approved or cleared by the [place]  FDA and has been authorized for detection and/or diagnosis of SARS-CoV-2 by FDA under an Emergency Use Authorization (EUA). This EUA will remain in effect (meaning this test can be used) for the duration of the COVID-19 declaration under Section 564(b)(1) of the Act, 21 U.S.C. section 360bbb-3(b)(1), unless the authorization is terminated or revoked.     Resp Syncytial Virus by PCR NEGATIVE NEGATIVE Final    Comment: (NOTE) Fact Sheet for Patients: BloggerCourse.com  Fact Sheet for Healthcare Providers: SeriousBroker.it  This test is not yet approved or cleared by the [place]  FDA and has been authorized for detection and/or diagnosis of SARS-CoV-2  by FDA under an Emergency Use Authorization (EUA). This EUA will remain in effect (meaning this test can be used) for the duration of the COVID-19 declaration under Section 564(b)(1) of the Act, 21 U.S.C. section 360bbb-3(b)(1), unless the authorization is terminated or revoked.  Performed at [facility], [address], [place], [place] [address]   Blood culture (routine x 2)     Status: None (Preliminary result)   Collection Time: 10/26/23  5:30 PM   Specimen: BLOOD RIGHT FOREARM  Result Value Ref Range Status   Specimen Description   Final    BLOOD RIGHT FOREARM Performed at [facility], [address], [place], [place] [address]    Special Requests   Final    BOTTLES DRAWN AEROBIC AND ANAEROBIC Blood Culture results may not be optimal due to an inadequate volume of blood received in culture bottles Performed at [facility], [address] [place], [place] [address]    Culture PENDING  Incomplete   Report Status PENDING  Incomplete     Radiological Exams on Admission: DG Chest 2 View Result Date: 10/25/2023 CLINICAL DATA:  cough EXAM: CHEST - 2 VIEW COMPARISON:  Chest x-ray  10/20/2023 FINDINGS: The heart and mediastinal contours are unchanged. Atherosclerotic plaque No focal consolidation. No pulmonary edema. No pleural effusion. No pneumothorax. No acute osseous abnormality. Sternotomy wires are intact. Bilateral shoulder degenerative changes. IMPRESSION: 1. No active cardiopulmonary disease. 2.  Aortic Atherosclerosis (ICD10-I70.0). Electronically Signed   By: Morgane  Naveau M.D.   On: 10/25/2023 21:21     Assessment/Plan   1. Acute UTI  - Not septic on admission, has hx of ESBL infections with prior isolate susceptible to Bactrim  but this was avoided due to hyperkalemia  - Continue Zosyn , follow cultures and clinical course     2. CAD  - No anginal complaints  - Continue ASA, statin, beta-blocker    3. Chronic HFrEF  - Appears compensated  - Continue Coreg , monitor volume status    4. CKD 3A; hyperkalemia  - SCr appears close to baseline; potassium is 5.5 on admission  - Renally-dose medications, repeat chem panel in am    5. PAF  - Continue Xarelto , amiodarone , and Coreg    6. Type II DM  - Check CBGs and use low-intensity SSI for now    DVT prophylaxis: Xarelto   Code Status: Full  Level of Care: Level of care: Med-Surg Family Communication: none present  Disposition Plan:  Patient is from: home  Anticipated d/c is to: TBD Anticipated d/c date is: 5/15 or 10/28/23  Patient currently: Pending urine culture, stable electrolytes  Consults called: None  Admission status: Observation     Walton Guppy, MD [facility]  10/26/2023, 10:38 PM

## 2023-10-26 NOTE — ED Provider Notes (Signed)
 [place] [facility] AT [facility] Provider Note   CSN: [id number] Arrival date & time: 10/26/23  1600     History {Add pertinent medical, surgical, social history, OB history to HPI:1} Chief Complaint  Patient presents with  . Ankle Pain    Jeremy [name] is a 68 y.o. male with PMH as listed below who presents bib EMS from [facility]. Swelling and pain in R ankle has been a week. Was seen here yesterday. HR 126 bpm, CBG 145 mg/dL.    Past Medical History:  Diagnosis Date  . CHF (congestive heart failure) (HCC)   . Coronary artery disease   . Diabetes mellitus without complication (HCC)   . Hypertension   . S/P CABG x 1        Home Medications Prior to Admission medications   Medication Sig Start Date End Date Taking? Authorizing Provider  amiodarone  (PACERONE ) 200 MG tablet Take 1 tablet (200 mg total) by mouth daily. 10/20/23 01/19/24 Yes Kandee Orion, MD  aspirin  81 MG chewable tablet Chew 1 tablet (81 mg total) by mouth daily. 10/20/23 02/17/24 Yes Kandee Orion, MD  atorvastatin  (LIPITOR) 40 MG tablet Take 1 tablet (40 mg total) by mouth daily. 10/20/23  Yes Kandee Orion, MD  carvedilol  (COREG ) 3.125 MG tablet Take 1 tablet (3.125 mg total) by mouth 2 (two) times daily. Reduced from 6.25 mg. 10/20/23 01/19/24 Yes Kandee Orion, MD  metFORMIN  (GLUCOPHAGE ) 1000 MG tablet Take 1 tablet (1,000 mg total) by mouth once daily with breakfast. 10/20/23 01/19/24 Yes Kandee Orion, MD  nitroGLYCERIN  (NITROSTAT ) 0.4 MG SL tablet Place 1 tablet (0.4 mg total) under the tongue every 5 (five) minutes as needed for chest pain. 11/26/22  Yes Alexander, Natalie, DO  pantoprazole  (PROTONIX ) 20 MG tablet Take 1 tablet (20 mg total) by mouth daily. 06/28/23  Yes Alexander, Natalie, DO  rivaroxaban  (XARELTO ) 20 MG TABS tablet Take 1 tablet (20 mg total) by mouth daily at 5 pm 10/20/23 01/19/24 Yes Kandee Orion, MD  sacubitril -valsartan  (ENTRESTO ) 24-26 MG Take 1 tablet by mouth  every 12 (twelve) hours. 06/28/23  Yes Alexander, Natalie, DO  torsemide  (DEMADEX ) 20 MG tablet Take 1 tablet (20 mg total) by mouth daily. 06/28/23  Yes Alexander, Natalie, DO  traMADol  (ULTRAM ) 50 MG tablet Take 1 tablet (50 mg total) by mouth daily as needed for pain. 06/03/23  Yes   acetaminophen  (TYLENOL ) 500 MG tablet Take 1 tablet (500 mg total) by mouth every 6 (six) hours as needed. Patient not taking: Reported on 10/27/2023 10/23/23   Afton Horse T, DO  azithromycin  (ZITHROMAX ) 250 MG tablet Take 1 tablet (250 mg total) by mouth daily. Patient not taking: Reported on 10/27/2023 10/03/23   Ruth Cove, MD  guaiFENesin -codeine  100-10 MG/5ML syrup Take 5 mLs by mouth every 6 (six) hours as needed for cough. Patient not taking: Reported on 10/27/2023 10/11/23   Ruth Cove, MD      Allergies    Patient has no known allergies.    Review of Systems   Review of Systems A 10 point review of systems was performed and is negative unless otherwise reported in HPI.  Physical Exam Updated Vital Signs BP (!) 138/57 (BP Location: Left Arm)   Pulse 84   Temp 98.1 F (36.7 C)   Resp 16   Ht 5\' 11"  (1.803 m)   Wt 101 kg   SpO2 99%   BMI 31.06 kg/m  Physical Exam General:  Normal appearing {Desc; male/male:11659}, lying in bed.  HEENT: PERRLA, Sclera anicteric, MMM, trachea midline.  Cardiology: RRR, no murmurs/rubs/gallops. BL radial and DP pulses equal bilaterally.  Resp: Normal respiratory rate and effort. CTAB, no wheezes, rhonchi, crackles.  Abd: Soft, non-tender, non-distended. No rebound tenderness or guarding.  GU: Deferred. MSK: No peripheral edema or signs of trauma. Extremities without deformity or TTP. No cyanosis or clubbing. Skin: warm, dry. No rashes or lesions. Back: No CVA tenderness Neuro: A&Ox4, CNs II-XII grossly intact. MAEs. Sensation grossly intact.  Psych: Normal mood and affect.   ED Results / Procedures / Treatments   Labs (all labs ordered are  listed, but only abnormal results are displayed) Labs Reviewed  URINE CULTURE - Abnormal; Notable for the following components:      Result Value   Culture   (*)    Value: >=100,000 COLONIES/mL ESCHERICHIA COLI Confirmed Extended Spectrum Beta-Lactamase Producer (ESBL).  In bloodstream infections from ESBL organisms, carbapenems are preferred over piperacillin /tazobactam. They are shown to have a lower risk of mortality.    Organism ID, Bacteria ESCHERICHIA COLI (*)    All other components within normal limits  CBC WITH DIFFERENTIAL/PLATELET - Abnormal; Notable for the following components:   RBC 3.84 (*)    Hemoglobin 11.9 (*)    HCT 37.5 (*)    All other components within normal limits  COMPREHENSIVE METABOLIC PANEL WITH GFR - Abnormal; Notable for the following components:   Potassium 5.5 (*)    Glucose, Bld 100 (*)    Creatinine, Ser 1.53 (*)    GFR, Estimated 50 (*)    All other components within normal limits  URINALYSIS, W/ REFLEX TO CULTURE (INFECTION SUSPECTED) - Abnormal; Notable for the following components:   Hgb urine dipstick SMALL (*)    Leukocytes,Ua MODERATE (*)    Bacteria, UA RARE (*)    All other components within normal limits  BASIC METABOLIC PANEL WITH GFR - Abnormal; Notable for the following components:   CO2 20 (*)    Glucose, Bld 136 (*)    Creatinine, Ser 1.27 (*)    All other components within normal limits  CBC - Abnormal; Notable for the following components:   RBC 3.64 (*)    Hemoglobin 11.4 (*)    HCT 35.8 (*)    All other components within normal limits  GLUCOSE, CAPILLARY - Abnormal; Notable for the following components:   Glucose-Capillary 158 (*)    All other components within normal limits  GLUCOSE, CAPILLARY - Abnormal; Notable for the following components:   Glucose-Capillary 110 (*)    All other components within normal limits  BASIC METABOLIC PANEL WITH GFR - Abnormal; Notable for the following components:   CO2 21 (*)    Glucose,  Bld 116 (*)    BUN 25 (*)    Creatinine, Ser 1.47 (*)    Calcium  8.5 (*)    GFR, Estimated 52 (*)    All other components within normal limits  CBC - Abnormal; Notable for the following components:   RBC 3.78 (*)    Hemoglobin 11.7 (*)    HCT 37.3 (*)    All other components within normal limits  C-REACTIVE PROTEIN - Abnormal; Notable for the following components:   CRP 4.7 (*)    All other components within normal limits  GLUCOSE, CAPILLARY - Abnormal; Notable for the following components:   Glucose-Capillary 136 (*)    All other components within normal limits  GLUCOSE, CAPILLARY - Abnormal;  Notable for the following components:   Glucose-Capillary 131 (*)    All other components within normal limits  GLUCOSE, CAPILLARY - Abnormal; Notable for the following components:   Glucose-Capillary 110 (*)    All other components within normal limits  SEDIMENTATION RATE - Abnormal; Notable for the following components:   Sed Rate 37 (*)    All other components within normal limits  GLUCOSE, CAPILLARY - Abnormal; Notable for the following components:   Glucose-Capillary 102 (*)    All other components within normal limits  BASIC METABOLIC PANEL WITH GFR - Abnormal; Notable for the following components:   Glucose, Bld 111 (*)    BUN 26 (*)    Creatinine, Ser 1.61 (*)    GFR, Estimated 47 (*)    All other components within normal limits  GLUCOSE, CAPILLARY - Abnormal; Notable for the following components:   Glucose-Capillary 113 (*)    All other components within normal limits  GLUCOSE, CAPILLARY - Abnormal; Notable for the following components:   Glucose-Capillary 155 (*)    All other components within normal limits  BASIC METABOLIC PANEL WITH GFR - Abnormal; Notable for the following components:   CO2 20 (*)    Glucose, Bld 110 (*)    BUN 28 (*)    Creatinine, Ser 1.60 (*)    GFR, Estimated 47 (*)    All other components within normal limits  GLUCOSE, CAPILLARY - Abnormal;  Notable for the following components:   Glucose-Capillary 167 (*)    All other components within normal limits  GLUCOSE, CAPILLARY - Abnormal; Notable for the following components:   Glucose-Capillary 143 (*)    All other components within normal limits  GLUCOSE, CAPILLARY - Abnormal; Notable for the following components:   Glucose-Capillary 115 (*)    All other components within normal limits  GLUCOSE, CAPILLARY - Abnormal; Notable for the following components:   Glucose-Capillary 138 (*)    All other components within normal limits  BASIC METABOLIC PANEL WITH GFR - Abnormal; Notable for the following components:   Glucose, Bld 143 (*)    BUN 29 (*)    Creatinine, Ser 1.97 (*)    GFR, Estimated 37 (*)    All other components within normal limits  C-REACTIVE PROTEIN - Abnormal; Notable for the following components:   CRP 1.5 (*)    All other components within normal limits  GLUCOSE, CAPILLARY - Abnormal; Notable for the following components:   Glucose-Capillary 166 (*)    All other components within normal limits  GLUCOSE, CAPILLARY - Abnormal; Notable for the following components:   Glucose-Capillary 114 (*)    All other components within normal limits  GLUCOSE, CAPILLARY - Abnormal; Notable for the following components:   Glucose-Capillary 115 (*)    All other components within normal limits  GLUCOSE, CAPILLARY - Abnormal; Notable for the following components:   Glucose-Capillary 156 (*)    All other components within normal limits  BASIC METABOLIC PANEL WITH GFR - Abnormal; Notable for the following components:   CO2 20 (*)    Glucose, Bld 104 (*)    BUN 30 (*)    Creatinine, Ser 1.84 (*)    GFR, Estimated 40 (*)    All other components within normal limits  GLUCOSE, CAPILLARY - Abnormal; Notable for the following components:   Glucose-Capillary 333 (*)    All other components within normal limits  GLUCOSE, CAPILLARY - Abnormal; Notable for the following components:    Glucose-Capillary 148 (*)  All other components within normal limits  GLUCOSE, CAPILLARY - Abnormal; Notable for the following components:   Glucose-Capillary 100 (*)    All other components within normal limits  GLUCOSE, CAPILLARY - Abnormal; Notable for the following components:   Glucose-Capillary 101 (*)    All other components within normal limits  BASIC METABOLIC PANEL WITH GFR - Abnormal; Notable for the following components:   CO2 20 (*)    Glucose, Bld 121 (*)    BUN 29 (*)    Creatinine, Ser 1.84 (*)    GFR, Estimated 40 (*)    All other components within normal limits  GLUCOSE, CAPILLARY - Abnormal; Notable for the following components:   Glucose-Capillary 122 (*)    All other components within normal limits  GLUCOSE, CAPILLARY - Abnormal; Notable for the following components:   Glucose-Capillary 110 (*)    All other components within normal limits  GLUCOSE, CAPILLARY - Abnormal; Notable for the following components:   Glucose-Capillary 134 (*)    All other components within normal limits  GLUCOSE, CAPILLARY - Abnormal; Notable for the following components:   Glucose-Capillary 121 (*)    All other components within normal limits  BASIC METABOLIC PANEL WITH GFR - Abnormal; Notable for the following components:   CO2 18 (*)    Glucose, Bld 105 (*)    BUN 32 (*)    Creatinine, Ser 1.70 (*)    GFR, Estimated 44 (*)    All other components within normal limits  GLUCOSE, CAPILLARY - Abnormal; Notable for the following components:   Glucose-Capillary 134 (*)    All other components within normal limits  GLUCOSE, CAPILLARY - Abnormal; Notable for the following components:   Glucose-Capillary 131 (*)    All other components within normal limits  CBG MONITORING, ED - Abnormal; Notable for the following components:   Glucose-Capillary 176 (*)    All other components within normal limits  CULTURE, BLOOD (ROUTINE X 2)  CULTURE, BLOOD (ROUTINE X 2)  LACTIC ACID, PLASMA   MAGNESIUM   GLUCOSE, CAPILLARY  MAGNESIUM   GLUCOSE, CAPILLARY  MAGNESIUM   MAGNESIUM   CBC WITH DIFFERENTIAL/PLATELET  GLUCOSE, CAPILLARY  MAGNESIUM   GLUCOSE, CAPILLARY  MAGNESIUM   CBC WITH DIFFERENTIAL/PLATELET  MAGNESIUM   GLUCOSE, CAPILLARY    EKG None  Radiology No results found.   Procedures Procedures  {Document cardiac monitor, telemetry assessment procedure when appropriate:1}  Medications Ordered in ED Medications  aspirin  chewable tablet 81 mg (81 mg Oral Given 11/03/23 0949)  amiodarone  (PACERONE ) tablet 200 mg (200 mg Oral Given 11/03/23 0948)  atorvastatin  (LIPITOR) tablet 40 mg (40 mg Oral Given 11/03/23 0948)  carvedilol  (COREG ) tablet 3.125 mg (3.125 mg Oral Given 11/03/23 0948)  rivaroxaban  (XARELTO ) tablet 20 mg (20 mg Oral Given 11/03/23 0948)  acetaminophen  (TYLENOL ) tablet 650 mg (has no administration in time range)    Or  acetaminophen  (TYLENOL ) suppository 650 mg (has no administration in time range)  oxyCODONE  (Oxy IR/ROXICODONE ) immediate release tablet 5 mg (has no administration in time range)  insulin  aspart (novoLOG ) injection 0-5 Units ( Subcutaneous Not Given 11/02/23 2144)  insulin  aspart (novoLOG ) injection 0-6 Units ( Subcutaneous Not Given 11/03/23 1232)  metoprolol  tartrate (LOPRESSOR ) injection 2.5 mg (has no administration in time range)  senna-docusate (Senokot-S) tablet 1 tablet (1 tablet Oral Given 11/03/23 0948)  polyethylene glycol (MIRALAX  / GLYCOLAX ) packet 17 g (has no administration in time range)  bisacodyl  (DULCOLAX) suppository 10 mg (has no administration in time range)  0.9 %  sodium  chloride infusion ( Intravenous New Bag/Given 11/03/23 0339)  piperacillin -tazobactam (ZOSYN ) IVPB 3.375 g (0 g Intravenous Stopped 10/26/23 2214)  piperacillin -tazobactam (ZOSYN ) IVPB 3.375 g (3.375 g Intravenous New Bag/Given 11/01/23 2100)  piperacillin -tazobactam (ZOSYN ) IVPB 3.375 g (3.375 g Intravenous New Bag/Given 11/02/23 1716)    ED  Course/ Medical Decision Making/ A&P                          Medical Decision Making Amount and/or Complexity of Data Reviewed Labs: ordered. Decision-making details documented in ED Course.  Risk Prescription drug management. Decision regarding hospitalization.    This patient presents to the ED for concern of ***, this involves an extensive number of treatment options, and is a complaint that carries with it a high risk of complications and morbidity.  I considered the following differential and admission for this acute, potentially life threatening condition.   MDM:    ***  Clinical Course as of 11/03/23 1559  Wed Oct 26, 2023  1827 WBC: 6.6 No leukocytosis  [HN]  1924 Urinalysis, w/ Reflex to Culture (Infection Suspected) -Urine, Clean Catch(!) +UTI. Per micro tab, had resistant E coli urine culture in 2025, sensitive to pip/tazo and bactrim . Will give zosyn  dose here. Urine culture ordered. [HN]  1928 Lactic Acid, Venous: 1.3 neg [HN]  2231 Dr. Brice Campi hospitalist coming to see patient [HN]    Clinical Course User Index [HN] Merdis Stalling, MD    Labs: I Ordered, and personally interpreted labs.  The pertinent results include:  ***  Imaging Studies ordered: I ordered imaging studies including *** I independently visualized and interpreted imaging. I agree with the radiologist interpretation  Additional history obtained from ***.  External records from outside source obtained and reviewed including ***  Cardiac Monitoring: .The patient was maintained on a cardiac monitor.  I personally viewed and interpreted the cardiac monitored which showed an underlying rhythm of: ***  Reevaluation: After the interventions noted above, I reevaluated the patient and found that they have :{resolved/improved/worsened:23923::"improved"}  Social Determinants of Health: .***  Disposition:  ***  Co morbidities that complicate the patient evaluation . Past Medical History:   Diagnosis Date  . CHF (congestive heart failure) (HCC)   . Coronary artery disease   . Diabetes mellitus without complication (HCC)   . Hypertension   . S/P CABG x 1      Medicines Meds ordered this encounter  Medications  . DISCONTD: cefTRIAXone (ROCEPHIN) 1 g in sodium chloride  0.9 % 100 mL IVPB    Antibiotic Indication::   UTI  . piperacillin -tazobactam (ZOSYN ) IVPB 3.375 g    Antibiotic Indication::   Intra-abdominal Infection  . aspirin  chewable tablet 81 mg  . amiodarone  (PACERONE ) tablet 200 mg  . atorvastatin  (LIPITOR) tablet 40 mg  . carvedilol  (COREG ) tablet 3.125 mg    Reduced from 6.25 mg.    . rivaroxaban  (XARELTO ) tablet 20 mg  . OR Linked Order Group   . acetaminophen  (TYLENOL ) tablet 650 mg   . acetaminophen  (TYLENOL ) suppository 650 mg  . oxyCODONE  (Oxy IR/ROXICODONE ) immediate release tablet 5 mg    Refill:  0  . DISCONTD: senna-docusate (Senokot-S) tablet 1 tablet  . insulin  aspart (novoLOG ) injection 0-5 Units    Correction coverage::   HS scale    CBG < 70::   Implement Hypoglycemia Standing Orders and refer to Hypoglycemia Standing Orders sidebar report    CBG 70 - 120::   0 units  CBG 121 - 150::   0 units    CBG 151 - 200::   0 units    CBG 201 - 250::   2 units    CBG 251 - 300::   3 units    CBG 301 - 350::   4 units    CBG 351 - 400::   5 units    CBG > 400:   call MD and obtain STAT lab verification  . insulin  aspart (novoLOG ) injection 0-6 Units    Correction coverage::   Very Sensitive (ESRD/Dialysis)    CBG < 70::   Implement Hypoglycemia Standing Orders and refer to Hypoglycemia Standing Orders sidebar report    CBG 70 - 120::   0 units    CBG 121 - 150::   0 units    CBG 151 - 200::   1 unit    CBG 201-250::   2 units    CBG 251-300::   3 units    CBG 301-350::   4 units    CBG 351-400::   5 units    CBG > 400:   Give 6 units and call MD  . piperacillin -tazobactam (ZOSYN ) IVPB 3.375 g    Antibiotic Indication::   Other Indication  (list below)    Other Indication::   UTI, hx of ESBL  . metoprolol  tartrate (LOPRESSOR ) injection 2.5 mg  . senna-docusate (Senokot-S) tablet 1 tablet  . polyethylene glycol (MIRALAX  / GLYCOLAX ) packet 17 g  . bisacodyl  (DULCOLAX) suppository 10 mg  . 0.9 %  sodium chloride  infusion  . piperacillin -tazobactam (ZOSYN ) IVPB 3.375 g    Antibiotic Indication::   Other Indication (list below)    Other Indication::   esbl uti    I have reviewed the patients home medicines and have made adjustments as needed  Problem List / ED Course: Problem List Items Addressed This Visit       Genitourinary   Cystitis - Primary         {Document critical care time when appropriate:1} {Document review of labs and clinical decision tools ie heart score, Chads2Vasc2 etc:1}  {Document your independent review of radiology images, and any outside records:1} {Document your discussion with family members, caretakers, and with consultants:1} {Document social determinants of health affecting pt's care:1} {Document your decision making why or why not admission, treatments were needed:1}  This note was created using dictation software, which may contain spelling or grammatical errors.

## 2023-10-26 NOTE — ED Notes (Signed)
 Pt unpleasant att. Refused to tell this tech his name. Refused cardiac monitoring. RN aware

## 2023-10-26 NOTE — ED Notes (Signed)
 Patient d/c with home care instructions. Patient assisted in wheel chair and wheeled to the front lobby.

## 2023-10-27 DIAGNOSIS — Z1152 Encounter for screening for COVID-19: Secondary | ICD-10-CM | POA: Diagnosis not present

## 2023-10-27 DIAGNOSIS — I7090 Unspecified atherosclerosis: Secondary | ICD-10-CM | POA: Diagnosis not present

## 2023-10-27 DIAGNOSIS — I5022 Chronic systolic (congestive) heart failure: Secondary | ICD-10-CM | POA: Diagnosis present

## 2023-10-27 DIAGNOSIS — I480 Paroxysmal atrial fibrillation: Secondary | ICD-10-CM | POA: Diagnosis present

## 2023-10-27 DIAGNOSIS — E11610 Type 2 diabetes mellitus with diabetic neuropathic arthropathy: Secondary | ICD-10-CM | POA: Diagnosis present

## 2023-10-27 DIAGNOSIS — I2510 Atherosclerotic heart disease of native coronary artery without angina pectoris: Secondary | ICD-10-CM | POA: Diagnosis present

## 2023-10-27 DIAGNOSIS — M14671 Charcot's joint, right ankle and foot: Secondary | ICD-10-CM | POA: Diagnosis not present

## 2023-10-27 DIAGNOSIS — Z794 Long term (current) use of insulin: Secondary | ICD-10-CM | POA: Diagnosis not present

## 2023-10-27 DIAGNOSIS — N3090 Cystitis, unspecified without hematuria: Secondary | ICD-10-CM | POA: Diagnosis present

## 2023-10-27 DIAGNOSIS — N1831 Chronic kidney disease, stage 3a: Secondary | ICD-10-CM | POA: Diagnosis present

## 2023-10-27 DIAGNOSIS — N390 Urinary tract infection, site not specified: Secondary | ICD-10-CM | POA: Diagnosis present

## 2023-10-27 DIAGNOSIS — D631 Anemia in chronic kidney disease: Secondary | ICD-10-CM | POA: Diagnosis present

## 2023-10-27 DIAGNOSIS — Z5900 Homelessness unspecified: Secondary | ICD-10-CM | POA: Diagnosis not present

## 2023-10-27 DIAGNOSIS — M25571 Pain in right ankle and joints of right foot: Secondary | ICD-10-CM | POA: Diagnosis present

## 2023-10-27 DIAGNOSIS — E1122 Type 2 diabetes mellitus with diabetic chronic kidney disease: Secondary | ICD-10-CM | POA: Diagnosis present

## 2023-10-27 DIAGNOSIS — E8721 Acute metabolic acidosis: Secondary | ICD-10-CM | POA: Diagnosis not present

## 2023-10-27 DIAGNOSIS — T8484XA Pain due to internal orthopedic prosthetic devices, implants and grafts, initial encounter: Secondary | ICD-10-CM | POA: Diagnosis present

## 2023-10-27 DIAGNOSIS — Z951 Presence of aortocoronary bypass graft: Secondary | ICD-10-CM | POA: Diagnosis not present

## 2023-10-27 DIAGNOSIS — E875 Hyperkalemia: Secondary | ICD-10-CM | POA: Diagnosis present

## 2023-10-27 DIAGNOSIS — Z6830 Body mass index (BMI) 30.0-30.9, adult: Secondary | ICD-10-CM | POA: Diagnosis not present

## 2023-10-27 DIAGNOSIS — Z1612 Extended spectrum beta lactamase (ESBL) resistance: Secondary | ICD-10-CM | POA: Diagnosis present

## 2023-10-27 DIAGNOSIS — E1165 Type 2 diabetes mellitus with hyperglycemia: Secondary | ICD-10-CM | POA: Diagnosis present

## 2023-10-27 DIAGNOSIS — E66811 Obesity, class 1: Secondary | ICD-10-CM | POA: Diagnosis present

## 2023-10-27 DIAGNOSIS — Y831 Surgical operation with implant of artificial internal device as the cause of abnormal reaction of the patient, or of later complication, without mention of misadventure at the time of the procedure: Secondary | ICD-10-CM | POA: Diagnosis present

## 2023-10-27 DIAGNOSIS — I5020 Unspecified systolic (congestive) heart failure: Secondary | ICD-10-CM | POA: Diagnosis not present

## 2023-10-27 DIAGNOSIS — B9620 Unspecified Escherichia coli [E. coli] as the cause of diseases classified elsewhere: Secondary | ICD-10-CM | POA: Diagnosis present

## 2023-10-27 DIAGNOSIS — E785 Hyperlipidemia, unspecified: Secondary | ICD-10-CM | POA: Diagnosis not present

## 2023-10-27 DIAGNOSIS — G8929 Other chronic pain: Secondary | ICD-10-CM | POA: Diagnosis present

## 2023-10-27 DIAGNOSIS — I130 Hypertensive heart and chronic kidney disease with heart failure and stage 1 through stage 4 chronic kidney disease, or unspecified chronic kidney disease: Secondary | ICD-10-CM | POA: Diagnosis present

## 2023-10-27 DIAGNOSIS — N179 Acute kidney failure, unspecified: Secondary | ICD-10-CM | POA: Diagnosis not present

## 2023-10-27 LAB — BASIC METABOLIC PANEL WITH GFR
Anion gap: 9 (ref 5–15)
BUN: 21 mg/dL (ref 8–23)
CO2: 20 mmol/L — ABNORMAL LOW (ref 22–32)
Calcium: 8.9 mg/dL (ref 8.9–10.3)
Chloride: 108 mmol/L (ref 98–111)
Creatinine, Ser: 1.27 mg/dL — ABNORMAL HIGH (ref 0.61–1.24)
GFR, Estimated: >60 mL/min (ref >60)
Glucose, Bld: 136 mg/dL — ABNORMAL HIGH (ref 70–99)
Potassium: 4 mmol/L (ref 3.5–5.1)
Sodium: 137 mmol/L (ref 135–145)

## 2023-10-27 LAB — GLUCOSE, CAPILLARY
Glucose-Capillary: 158 mg/dL — ABNORMAL HIGH (ref 70–99)
Glucose-Capillary: 110 mg/dL — ABNORMAL HIGH (ref 70–99)
Glucose-Capillary: 136 mg/dL — ABNORMAL HIGH (ref 70–99)
Glucose-Capillary: 131 mg/dL — ABNORMAL HIGH (ref 70–99)

## 2023-10-27 LAB — CBC
HCT: 35.8 % — ABNORMAL LOW (ref 39.0–52.0)
Hemoglobin: 11.4 g/dL — ABNORMAL LOW (ref 13.0–17.0)
MCH: 31.3 pg (ref 26.0–34.0)
MCHC: 31.8 g/dL (ref 30.0–36.0)
MCV: 98.4 fL (ref 80.0–100.0)
Platelets: 229 10*3/uL (ref 150–400)
RBC: 3.64 MIL/uL — ABNORMAL LOW (ref 4.22–5.81)
RDW: 14.6 % (ref 11.5–15.5)
WBC: 6.2 10*3/uL (ref 4.0–10.5)
nRBC: 0 % (ref 0.0–0.2)

## 2023-10-27 MED ORDER — METOPROLOL TARTRATE 5 MG/5ML IV SOLN: 2.5000 mg | INTRAVENOUS | Status: DC | PRN

## 2023-10-27 NOTE — Progress Notes (Signed)
 PROGRESS NOTE    Jeremy [name]  WUJ:[id number] DOB: [birth year] DOA: 10/26/2023 PCP: Antonio Baumgarten, MD   Brief Narrative:  68 y.o. male with medical history significant for coronary artery disease status post CABG, chronic HFrEF, type 2 diabetes mellitus, hypertension, CKD 3A, and atrial fibrillation on Xarelto  presented with fatigue, malaise and chronic right ankle pain.  In the ED, workup showed potassium of 5.5, creatinine of 1.53 and UA suggestive of UTI.  He was started on broad-spectrum antibiotics because of history of ESBL infections in the past.  Assessment & Plan:   Acute UTI: Present on admission -Currently on IV Zosyn  because of history of ESBL infection in the past.  Follow blood and urine culture.  Hyperkalemia - Questionable cause.  Resolved  CAD Hypertension Hyperlipidemia -Stable.  Continue aspirin , statin, Coreg   Paroxysmal A-fib - Mildly tachycardic intermittently.  Continue amiodarone , Coreg  and Xarelto   Chronic systolic heart failure - Currently compensated.  Continue Coreg .  Outpatient follow-up with cardiology.  Strict input output.  Daily weights.  Fluid restriction  Diabetes mellitus type 2 with hyperglycemia -Continue CBGs with SSI.  Carb modified diet  CKD stage IIIa - Creatinine currently at baseline.  Monitor intermittently  Acute metabolic acidosis -Mild.  Encourage oral intake.  Monitor  Anemia of chronic disease - From chronic kidney disease.  Hemoglobin stable.  Monitor intermittently  Obesity class I - Outpatient follow-up  Homelessness - TOC consult   DVT prophylaxis: Xarelto  Code Status: Full Family Communication: None at bedside Disposition Plan: Status is: Observation The patient will require care spanning > 2 midnights and should be moved to inpatient because: Of severity of illness.  Need for IV antibiotics.    Consultants: None  Procedures: None  Antimicrobials: Zosyn  from 10/26/2023  onwards   Subjective: Patient seen and examined at bedside.  No fever, vomiting, agitation reported.  Objective: Vitals:   10/26/23 2318 10/26/23 2337 10/27/23 0035 10/27/23 0431  BP: 128/66  129/76 123/80  Pulse: (!) 118  (!) 105 (!) 104  Resp:    18  Temp:   (!) 97.4 F (36.3 C) 98.9 F (37.2 C)  TempSrc:   Oral Oral  SpO2:  95% 99% 96%  Weight:      Height:        Intake/Output Summary (Last 24 hours) at 10/27/2023 0740 Last data filed at 10/27/2023 0600 Gross per 24 hour  Intake 580 ml  Output 700 ml  Net -120 ml   Filed Weights   10/26/23 1621  Weight: 99.8 kg    Examination:  General exam: Appears calm and comfortable. Respiratory system: Bilateral decreased breath sounds at bases, no wheezing Cardiovascular system: S1 & S2 heard, mild intermittent tachycardia present  gastrointestinal system: Abdomen is obese, nondistended, soft and nontender. Normal bowel sounds heard. Extremities: No cyanosis, clubbing, edema  Central nervous system: Alert and oriented.  Slow to respond.  Poor historian.  No focal neurological deficits. Moving extremities Skin: No rashes, lesions or ulcers Psychiatry: Flat affect.  Not agitated.   Data Reviewed: I have personally reviewed following labs and imaging studies  CBC: Recent Labs  Lab 10/20/23 1748 10/21/23 1807 10/25/23 2241 10/26/23 1725 10/27/23 0338  WBC 5.5 7.3 6.8 6.6 6.2  NEUTROABS  --  3.5 3.1 2.7  --   HGB 12.0* 13.4 12.2* 11.9* 11.4*  HCT 38.3* 42.1 38.5* 37.5* 35.8*  MCV 98.2 99.3 97.5 97.7 98.4  PLT 282 250 266 253 229   Basic Metabolic Panel: Recent Labs  Lab  10/20/23 1748 10/21/23 1807 10/25/23 2241 10/26/23 1725 10/27/23 0338  NA 140 138 137 139 137  K 4.4 4.4 3.9 5.5* 4.0  CL 106 107 107 107 108  CO2 24 20* 20* 26 20*  GLUCOSE 87 90 136* 100* 136*  BUN 16 19 21 23 21   CREATININE 1.57* 1.53* 1.34* 1.53* 1.27*  CALCIUM  9.5 9.7 9.7 9.8 8.9   GFR: Estimated Creatinine Clearance: 68.9 mL/min  (A) (by C-G formula based on SCr of 1.27 mg/dL (H)). Liver Function Tests: Recent Labs  Lab 10/26/23 1725  AST 18  ALT 10  ALKPHOS 70  BILITOT 0.9  PROT 7.6  ALBUMIN 3.5   No results for input(s): "LIPASE", "AMYLASE" in the last 168 hours. No results for input(s): "AMMONIA" in the last 168 hours. Coagulation Profile: No results for input(s): "INR", "PROTIME" in the last 168 hours. Cardiac Enzymes: No results for input(s): "CKTOTAL", "CKMB", "CKMBINDEX", "TROPONINI" in the last 168 hours. BNP (last 3 results) No results for input(s): "PROBNP" in the last 8760 hours. HbA1C: No results for input(s): "HGBA1C" in the last 72 hours. CBG: Recent Labs  Lab 10/25/23 1853 10/26/23 2321  GLUCAP 123* 176*   Lipid Profile: No results for input(s): "CHOL", "HDL", "LDLCALC", "TRIG", "CHOLHDL", "LDLDIRECT" in the last 72 hours. Thyroid  Function Tests: No results for input(s): "TSH", "T4TOTAL", "FREET4", "T3FREE", "THYROIDAB" in the last 72 hours. Anemia Panel: No results for input(s): "VITAMINB12", "FOLATE", "FERRITIN", "TIBC", "IRON", "RETICCTPCT" in the last 72 hours. Sepsis Labs: Recent Labs  Lab 10/26/23 1725  LATICACIDVEN 1.3    Recent Results (from the past 240 hours)  Resp panel by RT-PCR (RSV, Flu A&B, Covid) Anterior Nasal Swab     Status: None   Collection Time: 10/25/23 10:42 PM   Specimen: Anterior Nasal Swab  Result Value Ref Range Status   SARS Coronavirus 2 by RT PCR NEGATIVE NEGATIVE Final    Comment: (NOTE) SARS-CoV-2 target nucleic acids are NOT DETECTED.  The SARS-CoV-2 RNA is generally detectable in upper respiratory specimens during the acute phase of infection. The lowest concentration of SARS-CoV-2 viral copies this assay can detect is 138 copies/mL. A negative result does not preclude SARS-Cov-2 infection and should not be used as the sole basis for treatment or other patient management decisions. A negative result may occur with  improper specimen  collection/handling, submission of specimen other than nasopharyngeal swab, presence of viral mutation(s) within the areas targeted by this assay, and inadequate number of viral copies(<138 copies/mL). A negative result must be combined with clinical observations, patient history, and epidemiological information. The expected result is Negative.  Fact Sheet for Patients:  BloggerCourse.com  Fact Sheet for Healthcare Providers:  SeriousBroker.it  This test is no t yet approved or cleared by the [place]  FDA and  has been authorized for detection and/or diagnosis of SARS-CoV-2 by FDA under an Emergency Use Authorization (EUA). This EUA will remain  in effect (meaning this test can be used) for the duration of the COVID-19 declaration under Section 564(b)(1) of the Act, 21 U.S.C.section 360bbb-3(b)(1), unless the authorization is terminated  or revoked sooner.       Influenza A by PCR NEGATIVE NEGATIVE Final   Influenza B by PCR NEGATIVE NEGATIVE Final    Comment: (NOTE) The Xpert Xpress SARS-CoV-2/FLU/RSV plus assay is intended as an aid in the diagnosis of influenza from Nasopharyngeal swab specimens and should not be used as a sole basis for treatment. Nasal washings and aspirates are unacceptable for Xpert Xpress  SARS-CoV-2/FLU/RSV testing.  Fact Sheet for Patients: BloggerCourse.com  Fact Sheet for Healthcare Providers: SeriousBroker.it  This test is not yet approved or cleared by the [place]  FDA and has been authorized for detection and/or diagnosis of SARS-CoV-2 by FDA under an Emergency Use Authorization (EUA). This EUA will remain in effect (meaning this test can be used) for the duration of the COVID-19 declaration under Section 564(b)(1) of the Act, 21 U.S.C. section 360bbb-3(b)(1), unless the authorization is terminated or revoked.     Resp Syncytial  Virus by PCR NEGATIVE NEGATIVE Final    Comment: (NOTE) Fact Sheet for Patients: BloggerCourse.com  Fact Sheet for Healthcare Providers: SeriousBroker.it  This test is not yet approved or cleared by the [place]  FDA and has been authorized for detection and/or diagnosis of SARS-CoV-2 by FDA under an Emergency Use Authorization (EUA). This EUA will remain in effect (meaning this test can be used) for the duration of the COVID-19 declaration under Section 564(b)(1) of the Act, 21 U.S.C. section 360bbb-3(b)(1), unless the authorization is terminated or revoked.  Performed at [facility], [address], [place], [place] [address]   Blood culture (routine x 2)     Status: None (Preliminary result)   Collection Time: 10/26/23  5:30 PM   Specimen: BLOOD RIGHT FOREARM  Result Value Ref Range Status   Specimen Description   Final    BLOOD RIGHT FOREARM Performed at [facility], [address], [place], [place] [address]    Special Requests   Final    BOTTLES DRAWN AEROBIC AND ANAEROBIC Blood Culture results may not be optimal due to an inadequate volume of blood received in culture bottles Performed at [facility], [address], [place], [place] [address]    Culture PENDING  Incomplete   Report Status PENDING  Incomplete         Radiology Studies: DG Chest 2 View Result Date: 10/25/2023 CLINICAL DATA:  cough EXAM: CHEST - 2 VIEW COMPARISON:  Chest x-ray 10/20/2023 FINDINGS: The heart and mediastinal contours are unchanged. Atherosclerotic plaque No focal consolidation. No pulmonary edema. No pleural effusion. No pneumothorax. No acute osseous abnormality. Sternotomy wires are intact. Bilateral shoulder degenerative changes. IMPRESSION: 1. No active cardiopulmonary disease. 2.  Aortic Atherosclerosis (ICD10-I70.0). Electronically Signed   By: Morgane  Naveau M.D.   On: 10/25/2023  21:21        Scheduled Meds:  amiodarone   200 mg Oral Daily   aspirin   81 mg Oral Daily   atorvastatin   40 mg Oral Daily   carvedilol   3.125 mg Oral BID   insulin  aspart  0-5 Units Subcutaneous QHS   insulin  aspart  0-6 Units Subcutaneous TID WC   rivaroxaban   20 mg Oral Daily   Continuous Infusions:  piperacillin -tazobactam (ZOSYN )  IV 3.375 g (10/27/23 0549)          Audria Leather, MD [facility] 10/27/2023, 7:40 AM

## 2023-10-27 NOTE — Progress Notes (Signed)
 PT Cancellation Note  Patient Details Name: Jeremy [name] MRN: [id number] DOB: 1956/04/02   Cancelled Treatment:    Reason Eval/Treat Not Completed: Patient declined, no reason specified  Abelina Hoes PT Acute Rehabilitation Services Office [phone]  Dareen Ebbing 10/27/2023, 12:31 PM

## 2023-10-27 NOTE — Plan of Care (Signed)

## 2023-10-28 ENCOUNTER — Inpatient Hospital Stay (HOSPITAL_COMMUNITY)

## 2023-10-28 DIAGNOSIS — N3090 Cystitis, unspecified without hematuria: Secondary | ICD-10-CM | POA: Diagnosis not present

## 2023-10-28 DIAGNOSIS — M14671 Charcot's joint, right ankle and foot: Secondary | ICD-10-CM | POA: Diagnosis present

## 2023-10-28 LAB — GLUCOSE, CAPILLARY
Glucose-Capillary: 110 mg/dL — ABNORMAL HIGH (ref 70–99)
Glucose-Capillary: 96 mg/dL (ref 70–99)
Glucose-Capillary: 102 mg/dL — ABNORMAL HIGH (ref 70–99)
Glucose-Capillary: 113 mg/dL — ABNORMAL HIGH (ref 70–99)

## 2023-10-28 LAB — URINE CULTURE: Culture: >=100000 — AB

## 2023-10-28 LAB — BASIC METABOLIC PANEL WITH GFR
Anion gap: 7 (ref 5–15)
BUN: 25 mg/dL — ABNORMAL HIGH (ref 8–23)
CO2: 21 mmol/L — ABNORMAL LOW (ref 22–32)
Calcium: 8.5 mg/dL — ABNORMAL LOW (ref 8.9–10.3)
Chloride: 107 mmol/L (ref 98–111)
Creatinine, Ser: 1.47 mg/dL — ABNORMAL HIGH (ref 0.61–1.24)
GFR, Estimated: 52 mL/min — ABNORMAL LOW (ref >60)
Glucose, Bld: 116 mg/dL — ABNORMAL HIGH (ref 70–99)
Potassium: 4.2 mmol/L (ref 3.5–5.1)
Sodium: 135 mmol/L (ref 135–145)

## 2023-10-28 LAB — CBC
HCT: 37.3 % — ABNORMAL LOW (ref 39.0–52.0)
Hemoglobin: 11.7 g/dL — ABNORMAL LOW (ref 13.0–17.0)
MCH: 31 pg (ref 26.0–34.0)
MCHC: 31.4 g/dL (ref 30.0–36.0)
MCV: 98.7 fL (ref 80.0–100.0)
Platelets: 250 10*3/uL (ref 150–400)
RBC: 3.78 MIL/uL — ABNORMAL LOW (ref 4.22–5.81)
RDW: 14.6 % (ref 11.5–15.5)
WBC: 7 10*3/uL (ref 4.0–10.5)
nRBC: 0 % (ref 0.0–0.2)

## 2023-10-28 LAB — SEDIMENTATION RATE: Sed Rate: 37 mm/h — ABNORMAL HIGH (ref 0–16)

## 2023-10-28 LAB — C-REACTIVE PROTEIN: CRP: 4.7 mg/dL — ABNORMAL HIGH (ref <1.0)

## 2023-10-28 LAB — MAGNESIUM: Magnesium: 2.2 mg/dL (ref 1.7–2.4)

## 2023-10-28 MED ORDER — BISACODYL 10 MG RE SUPP: 10.0000 mg | Freq: Every day | RECTAL | Status: DC | PRN

## 2023-10-28 MED ORDER — POLYETHYLENE GLYCOL 3350 17 G PO PACK: 17.0000 g | PACK | Freq: Every day | ORAL | Status: DC | PRN

## 2023-10-28 MED ORDER — SENNOSIDES-DOCUSATE SODIUM 8.6-50 MG PO TABS
1.0000 | ORAL_TABLET | Freq: Two times a day (BID) | ORAL | Status: DC
  Administered 2023-10-28 – 2023-11-04 (×15): 1 via ORAL
  Filled 2023-10-28 (×15): qty 1

## 2023-10-28 NOTE — Progress Notes (Signed)
 PROGRESS NOTE    Jeremy [name]  ZOX:[id number] DOB: May 30, 1956 DOA: 10/26/2023 PCP: Antonio Baumgarten, MD   Brief Narrative:  68 y.o. male with medical history significant for coronary artery disease status post CABG, chronic HFrEF, type 2 diabetes mellitus, hypertension, CKD 3A, and atrial fibrillation on Xarelto  presented with fatigue, malaise and chronic right ankle pain.  In the ED, workup showed potassium of 5.5, creatinine of 1.53 and UA suggestive of UTI.  He was started on broad-spectrum antibiotics because of history of ESBL infections in the past.  Assessment & Plan:   Acute UTI: Present on admission -Currently on IV Zosyn  because of history of ESBL infection in the past.  Blood cultures negative so far.  Urine culture growing gram-negative rods.  Follow identification and susceptibilities.  Hyperkalemia - Questionable cause.  Resolved  CAD Hypertension Hyperlipidemia -Stable.  Continue aspirin , statin, Coreg   Paroxysmal A-fib - Currently rate controlled.  Continue amiodarone , Coreg  and Xarelto   Chronic systolic heart failure - Currently compensated.  Continue Coreg .  Outpatient follow-up with cardiology.  Strict input output.  Daily weights.  Fluid restriction  Diabetes mellitus type 2 with hyperglycemia -Continue CBGs with SSI.  Carb modified diet  CKD stage IIIa - Creatinine currently at baseline.  Monitor intermittently  Acute metabolic acidosis -Mild.  Encourage oral intake.  Monitor  Anemia of chronic disease - From chronic kidney disease.  Hemoglobin stable.  Monitor intermittently  Obesity class I - Outpatient follow-up  Homelessness - TOC consult   DVT prophylaxis: Xarelto  Code Status: Full Family Communication: None at bedside Disposition Plan: Status is: inpatient because: Of severity of illness.  Need for IV antibiotics.    Consultants: None  Procedures: None  Antimicrobials: Zosyn  from 10/26/2023 onwards   Subjective: Patient  seen and examined at bedside.  Poor historian.  No seizures, agitation, vomiting reported.   Objective: Vitals:   10/27/23 2125 10/28/23 0242 10/28/23 0500 10/28/23 0610  BP: (!) 135/53 (!) 107/51  130/73  Pulse: (!) 101 98  95  Resp:  17  17  Temp:  98.1 F (36.7 C)  98 F (36.7 C)  TempSrc:      SpO2:  99%  100%  Weight:   98.2 kg   Height:        Intake/Output Summary (Last 24 hours) at 10/28/2023 0742 Last data filed at 10/28/2023 0600 Gross per 24 hour  Intake 780.74 ml  Output 2350 ml  Net -1569.26 ml   Filed Weights   10/26/23 1621 10/28/23 0500  Weight: 99.8 kg 98.2 kg    Examination:  General: On room air.  No distress ENT/neck: No thyromegaly.  JVD is not elevated  respiratory: Decreased breath sounds at bases bilaterally with some crackles; no wheezing  CVS: S1-S2 heard, rate controlled currently Abdominal: Soft, obese, nontender, slightly distended; no organomegaly, bowel sounds are heard Extremities: Trace lower extremity edema; no cyanosis  CNS: Awake and alert.  Still slow to respond. Very poor historian.  No focal neurologic deficit.  Moves extremities Lymph: No obvious lymphadenopathy Skin: No obvious ecchymosis/lesions  psych: Mostly flat affect.  Not agitated currently. musculoskeletal: No obvious joint swelling/deformity    Data Reviewed: I have personally reviewed following labs and imaging studies  CBC: Recent Labs  Lab 10/21/23 1807 10/25/23 2241 10/26/23 1725 10/27/23 0338 10/28/23 0332  WBC 7.3 6.8 6.6 6.2 7.0  NEUTROABS 3.5 3.1 2.7  --   --   HGB 13.4 12.2* 11.9* 11.4* 11.7*  HCT 42.1 38.5* 37.5* 35.8*  37.3*  MCV 99.3 97.5 97.7 98.4 98.7  PLT 250 266 253 229 250   Basic Metabolic Panel: Recent Labs  Lab 10/21/23 1807 10/25/23 2241 10/26/23 1725 10/27/23 0338 10/28/23 0332  NA 138 137 139 137 135  K 4.4 3.9 5.5* 4.0 4.2  CL 107 107 107 108 107  CO2 20* 20* 26 20* 21*  GLUCOSE 90 136* 100* 136* 116*  BUN 19 21 23 21  25*   CREATININE 1.53* 1.34* 1.53* 1.27* 1.47*  CALCIUM  9.7 9.7 9.8 8.9 8.5*  MG  --   --   --   --  2.2   GFR: Estimated Creatinine Clearance: 59.1 mL/min (A) (by C-G formula based on SCr of 1.47 mg/dL (H)). Liver Function Tests: Recent Labs  Lab 10/26/23 1725  AST 18  ALT 10  ALKPHOS 70  BILITOT 0.9  PROT 7.6  ALBUMIN 3.5   No results for input(s): "LIPASE", "AMYLASE" in the last 168 hours. No results for input(s): "AMMONIA" in the last 168 hours. Coagulation Profile: No results for input(s): "INR", "PROTIME" in the last 168 hours. Cardiac Enzymes: No results for input(s): "CKTOTAL", "CKMB", "CKMBINDEX", "TROPONINI" in the last 168 hours. BNP (last 3 results) No results for input(s): "PROBNP" in the last 8760 hours. HbA1C: No results for input(s): "HGBA1C" in the last 72 hours. CBG: Recent Labs  Lab 10/26/23 2321 10/27/23 0751 10/27/23 1211 10/27/23 1727 10/27/23 2011  GLUCAP 176* 158* 110* 136* 131*   Lipid Profile: No results for input(s): "CHOL", "HDL", "LDLCALC", "TRIG", "CHOLHDL", "LDLDIRECT" in the last 72 hours. Thyroid  Function Tests: No results for input(s): "TSH", "T4TOTAL", "FREET4", "T3FREE", "THYROIDAB" in the last 72 hours. Anemia Panel: No results for input(s): "VITAMINB12", "FOLATE", "FERRITIN", "TIBC", "IRON", "RETICCTPCT" in the last 72 hours. Sepsis Labs: Recent Labs  Lab 10/26/23 1725  LATICACIDVEN 1.3    Recent Results (from the past 240 hours)  Resp panel by RT-PCR (RSV, Flu A&B, Covid) Anterior Nasal Swab     Status: None   Collection Time: 10/25/23 10:42 PM   Specimen: Anterior Nasal Swab  Result Value Ref Range Status   SARS Coronavirus 2 by RT PCR NEGATIVE NEGATIVE Final    Comment: (NOTE) SARS-CoV-2 target nucleic acids are NOT DETECTED.  The SARS-CoV-2 RNA is generally detectable in upper respiratory specimens during the acute phase of infection. The lowest concentration of SARS-CoV-2 viral copies this assay can detect is 138  copies/mL. A negative result does not preclude SARS-Cov-2 infection and should not be used as the sole basis for treatment or other patient management decisions. A negative result may occur with  improper specimen collection/handling, submission of specimen other than nasopharyngeal swab, presence of viral mutation(s) within the areas targeted by this assay, and inadequate number of viral copies(<138 copies/mL). A negative result must be combined with clinical observations, patient history, and epidemiological information. The expected result is Negative.  Fact Sheet for Patients:  BloggerCourse.com  Fact Sheet for Healthcare Providers:  SeriousBroker.it  This test is no t yet approved or cleared by the [place]  FDA and  has been authorized for detection and/or diagnosis of SARS-CoV-2 by FDA under an Emergency Use Authorization (EUA). This EUA will remain  in effect (meaning this test can be used) for the duration of the COVID-19 declaration under Section 564(b)(1) of the Act, 21 U.S.C.section 360bbb-3(b)(1), unless the authorization is terminated  or revoked sooner.       Influenza A by PCR NEGATIVE NEGATIVE Final   Influenza B by PCR  NEGATIVE NEGATIVE Final    Comment: (NOTE) The Xpert Xpress SARS-CoV-2/FLU/RSV plus assay is intended as an aid in the diagnosis of influenza from Nasopharyngeal swab specimens and should not be used as a sole basis for treatment. Nasal washings and aspirates are unacceptable for Xpert Xpress SARS-CoV-2/FLU/RSV testing.  Fact Sheet for Patients: BloggerCourse.com  Fact Sheet for Healthcare Providers: SeriousBroker.it  This test is not yet approved or cleared by the [place]  FDA and has been authorized for detection and/or diagnosis of SARS-CoV-2 by FDA under an Emergency Use Authorization (EUA). This EUA will remain in effect (meaning  this test can be used) for the duration of the COVID-19 declaration under Section 564(b)(1) of the Act, 21 U.S.C. section 360bbb-3(b)(1), unless the authorization is terminated or revoked.     Resp Syncytial Virus by PCR NEGATIVE NEGATIVE Final    Comment: (NOTE) Fact Sheet for Patients: BloggerCourse.com  Fact Sheet for Healthcare Providers: SeriousBroker.it  This test is not yet approved or cleared by the [place]  FDA and has been authorized for detection and/or diagnosis of SARS-CoV-2 by FDA under an Emergency Use Authorization (EUA). This EUA will remain in effect (meaning this test can be used) for the duration of the COVID-19 declaration under Section 564(b)(1) of the Act, 21 U.S.C. section 360bbb-3(b)(1), unless the authorization is terminated or revoked.  Performed at [facility], [address], [place], [place] [address]   Blood culture (routine x 2)     Status: None (Preliminary result)   Collection Time: 10/26/23  5:25 PM   Specimen: BLOOD  Result Value Ref Range Status   Specimen Description   Final    BLOOD RIGHT ANTECUBITAL Performed at [facility], [address], [place], [place] [address]    Special Requests   Final    BOTTLES DRAWN AEROBIC AND ANAEROBIC Blood Culture adequate volume Performed at [facility], [address] [place], [place] [address]    Culture   Final    NO GROWTH < 24 HOURS Performed at [facility], [address] [place], [place] [address]    Report Status PENDING  Incomplete  Blood culture (routine x 2)     Status: None (Preliminary result)   Collection Time: 10/26/23  5:30 PM   Specimen: BLOOD RIGHT FOREARM  Result Value Ref Range Status   Specimen Description   Final    BLOOD RIGHT FOREARM Performed at [facility], [address] [place], [place] [address]    Special Requests   Final    BOTTLES  DRAWN AEROBIC AND ANAEROBIC Blood Culture results may not be optimal due to an inadequate volume of blood received in culture bottles Performed at [facility], [address], [place], [place] [address]    Culture   Final    NO GROWTH < 24 HOURS Performed at [facility], [address] [place], [place] [address]    Report Status PENDING  Incomplete  Urine Culture     Status: Abnormal (Preliminary result)   Collection Time: 10/26/23  6:50 PM   Specimen: Urine, Random  Result Value Ref Range Status   Specimen Description   Final    URINE, RANDOM Performed at [facility], [address], [place], [place] [address]    Special Requests   Final    NONE Reflexed from [id number] Performed at [facility], [address], [place], [place] [address]  Culture (A)  Final    >=100,000 COLONIES/mL GRAM NEGATIVE RODS IDENTIFICATION AND SUSCEPTIBILITIES TO FOLLOW Performed at [facility], [address] [place], [place] [address]    Report Status PENDING  Incomplete         Radiology Studies: No results found.       Scheduled Meds:  amiodarone   200 mg Oral Daily   aspirin   81 mg Oral Daily   atorvastatin   40 mg Oral Daily   carvedilol   3.125 mg Oral BID   insulin  aspart  0-5 Units Subcutaneous QHS   insulin  aspart  0-6 Units Subcutaneous TID WC   rivaroxaban   20 mg Oral Daily   Continuous Infusions:  piperacillin -tazobactam (ZOSYN )  IV 3.375 g (10/28/23 0355)          Audria Leather, MD [facility] 10/28/2023, 7:42 AM

## 2023-10-28 NOTE — [place] FL2 (Signed)
 [place]  MEDICAID FL2 LEVEL OF CARE FORM     IDENTIFICATION  Patient Name: Jeremy [name] Birthdate: 08/21/1955 Sex: male Admission Date (Current Location): 10/26/2023  [facility] and [place] Number:  Producer, television/film/video and Address:  [facility],  [address] [place], [place] [address]      Provider Number: [id number]  Attending Physician Name and Address:  Audria Leather, MD  Relative Name and Phone Number:  Monika Annas [facility])  [phone] [facility]    Current Level of Care: Hospital Recommended Level of Care: Skilled Nursing Facility Prior Approval Number:    Date Approved/Denied:   PASRR Number: [id number]  Discharge Plan: SNF    Current Diagnoses: Patient Active Problem List   Diagnosis Date Noted   UTI (urinary tract infection) 10/27/2023   Cystitis 10/26/2023   Acute metabolic encephalopathy 08/07/2023   Sepsis due to gram-negative UTI (HCC) 08/07/2023   Dyslipidemia 08/07/2023   Paroxysmal atrial fibrillation (HCC) 08/07/2023   GERD without esophagitis 08/07/2023   Coronary artery disease 08/07/2023   Type 2 diabetes mellitus with chronic kidney disease, with long-term current use of insulin  (HCC) 08/07/2023   Sepsis (HCC) 08/07/2023   Septic shock (HCC) 07/31/2023   COPD with acute exacerbation (HCC) 06/26/2023   Afib (HCC) 06/26/2023   Adjustment disorder with depressed mood 03/14/2023   Acute ST elevation myocardial infarction (STEMI) of posterior wall (HCC) 11/24/2022   HFrEF (heart failure with reduced ejection fraction) (HCC) 11/24/2022   PAF (paroxysmal atrial fibrillation) (HCC) 11/24/2022   OSA (obstructive sleep apnea) 11/24/2022   Coronary artery disease involving native coronary artery of native heart 11/24/2022   Polysubstance abuse (HCC) 11/24/2022   Hyperlipidemia 11/24/2022   Aortic atherosclerosis (HCC) 07/16/2021   Obesity (BMI 30-39.9) 07/16/2021   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/2021   Stage 3 chronic kidney disease (HCC) 07/16/2021    On mechanically assisted ventilation (HCC) 12/28/2020   Atrial fibrillation with RVR (HCC) 12/28/2020   Acute delirium 12/28/2020   Tobacco abuse 12/28/2020   Diabetes mellitus, type 2 (HCC) 12/28/2020   NSTEMI (non-ST elevated myocardial infarction) (HCC)    Acute respiratory failure with hypoxia (HCC)    Acute pulmonary embolism (HCC) 12/22/2020   History of cocaine use 11/26/2019   History of marijuana use 11/26/2019   History of illicit drug use 11/26/2019   Pharmacologic therapy 11/26/2019   Acute pain (now resolved) 11/26/2019   MRSA (methicillin resistant Staphylococcus aureus) infection 10/18/2019   Hyperosmolar non-ketotic state due to type 2 diabetes mellitus (HCC) 08/21/2019   Hyponatremia 08/19/2019   AKI (acute kidney injury) (HCC) 08/19/2019   Essential hypertension 08/19/2019   Medically noncompliant 08/19/2019   Post-traumatic osteoarthritis of right ankle 10/23/2018   Skin ulcer of right ankle with fat layer exposed (HCC) 10/23/2018   Status post ORIF of fracture of ankle 10/23/2018   Diabetic foot infection (HCC) 10/22/2018   Pressure injury of skin 02/11/2018    Orientation RESPIRATION BLADDER Height & Weight     Self, Time, Situation, Place  Normal Continent Weight: 98.2 kg Height:  5\' 11"  (180.3 cm)  BEHAVIORAL SYMPTOMS/MOOD NEUROLOGICAL BOWEL NUTRITION STATUS      Continent Diet (carb modified)  AMBULATORY STATUS COMMUNICATION OF NEEDS Skin   Extensive Assist Verbally Other (Comment) (right ankle diabetic ulcer)                       Personal Care Assistance Level of Assistance  Bathing, Feeding, Dressing Bathing Assistance: Limited assistance Feeding assistance: Limited assistance  Dressing Assistance: Limited assistance     Functional Limitations Info  Sight, Hearing, Speech Sight Info: Adequate Hearing Info: Adequate Speech Info: Adequate    SPECIAL CARE FACTORS FREQUENCY  PT (By licensed PT), OT (By licensed OT)     PT Frequency:  5x/wk OT Frequency: 5x/wk            Contractures Contractures Info: Not present    Additional Factors Info  Code Status, Allergies, Psychotropic Code Status Info: Full  Code Allergies Info: NKA Psychotropic Info: N/A         Current Medications (10/28/2023):  This is the current hospital active medication list Current Facility-Administered Medications  Medication Dose Route Frequency Provider Last Rate Last Admin   acetaminophen  (TYLENOL ) tablet 650 mg  650 mg Oral Q6H PRN Opyd, Timothy S, MD       Or   acetaminophen  (TYLENOL ) suppository 650 mg  650 mg Rectal Q6H PRN Opyd, Timothy S, MD       amiodarone  (PACERONE ) tablet 200 mg  200 mg Oral Daily Opyd, Timothy S, MD   200 mg at 10/28/23 0940   aspirin  chewable tablet 81 mg  81 mg Oral Daily Opyd, Timothy S, MD   81 mg at 10/28/23 0941   atorvastatin  (LIPITOR) tablet 40 mg  40 mg Oral Daily Opyd, Timothy S, MD   40 mg at 10/28/23 0940   bisacodyl (DULCOLAX) suppository 10 mg  10 mg Rectal Daily PRN Audria Leather, MD       carvedilol  (COREG ) tablet 3.125 mg  3.125 mg Oral BID Opyd, Timothy S, MD   3.125 mg at 10/28/23 0940   insulin  aspart (novoLOG ) injection 0-5 Units  0-5 Units Subcutaneous QHS Opyd, Timothy S, MD       insulin  aspart (novoLOG ) injection 0-6 Units  0-6 Units Subcutaneous TID WC Opyd, Timothy S, MD   1 Units at 10/27/23 2841   metoprolol  tartrate (LOPRESSOR ) injection 2.5 mg  2.5 mg Intravenous Q4H PRN Audria Leather, MD       oxyCODONE  (Oxy IR/ROXICODONE ) immediate release tablet 5 mg  5 mg Oral Q4H PRN Opyd, Timothy S, MD       piperacillin -tazobactam (ZOSYN ) IVPB 3.375 g  3.375 g Intravenous Q8H Opyd, Timothy S, MD 12.5 mL/hr at 10/28/23 0355 3.375 g at 10/28/23 0355   polyethylene glycol (MIRALAX  / GLYCOLAX ) packet 17 g  17 g Oral Daily PRN Audria Leather, MD       rivaroxaban  (XARELTO ) tablet 20 mg  20 mg Oral Daily Opyd, Timothy S, MD   20 mg at 10/28/23 0940   senna-docusate (Senokot-S) tablet 1 tablet  1  tablet Oral BID Audria Leather, MD   1 tablet at 10/28/23 0940     Discharge Medications: Please see discharge summary for a list of discharge medications.  Relevant Imaging Results:  Relevant Lab Results:   Additional Information SSN: [id number]  Bari Leys, RN

## 2023-10-28 NOTE — Progress Notes (Signed)
 Orthopedic Tech Progress Note Patient Details:  Jeremy [name] 10/26/1955 [id number]  Ortho Devices Type of Ortho Device: CAM walker Ortho Device/Splint Location: RLE Ortho Device/Splint Interventions: Application   Post Interventions Patient Tolerated: Well  Jeremy [name] 10/28/2023, 12:20 PM

## 2023-10-28 NOTE — TOC Initial Note (Addendum)
 Transition of Care [facility]) - Initial/Assessment Note    Patient Details  Name: Jeremy [name] MRN: [id number] Date of Birth: [birth year]  Transition of Care [facility]) CM/SW Contact:    Bari Leys, RN Phone Number: 10/28/2023, 10:15 AM  Clinical Narrative:  [facility] consult for Homelessness. NCM met with patient at bedside to introduce role of TOC/NCM and review for dc planning, pt confirmed homelessness, reports he has been sleeping on the street, patient reports he has no friends or family he can live with. NCM educated patient on IRC, Archivist (patient reports he was thrown out of the [employer]), possibility of extended say hotel, patient reports he receives about $1000.00/month in [place]. PT eval completed, recommendation for short term rehab/SNF, patient agreeable.   FL@ updated, faxed out for bed offers.                Expected Discharge Plan: Skilled Nursing Facility     Patient Goals and CMS Choice   CMS Medicare.gov Compare Post Acute Care list provided to:: Patient Choice offered to / list presented to : Patient [place] ownership interest in [facility].provided to:: Patient    Expected Discharge Plan and Services       Living arrangements for the past 2 months: Homeless                                      Prior Living Arrangements/Services Living arrangements for the past 2 months: Homeless   Patient language and need for interpreter reviewed:: Yes        Need for Family Participation in Patient Care: Yes (Comment) Care giver support system in place?: Yes (comment)   Criminal Activity/Legal Involvement Pertinent to Current Situation/Hospitalization: No - Comment as needed  Activities of Daily Living   ADL Screening (condition at time of admission) Independently performs ADLs?: Yes (appropriate for developmental age) Is the patient deaf or have difficulty hearing?: No Does the patient have difficulty seeing, even when wearing  glasses/contacts?: No Does the patient have difficulty concentrating, remembering, or making decisions?: No  Permission Sought/Granted                  Emotional Assessment Appearance:: Appears stated age Attitude/Demeanor/Rapport: Engaged Affect (typically observed): Accepting Orientation: : Oriented to Self, Oriented to Place, Oriented to  Time, Oriented to Situation Alcohol  / Substance Use: Not Applicable Psych Involvement: No (comment)  Admission diagnosis:  Cystitis [N30.90] UTI (urinary tract infection) [N39.0] Patient Active Problem List   Diagnosis Date Noted   UTI (urinary tract infection) 10/27/2023   Cystitis 10/26/2023   Acute metabolic encephalopathy 08/07/2023   Sepsis due to gram-negative UTI (HCC) 08/07/2023   Dyslipidemia 08/07/2023   Paroxysmal atrial fibrillation (HCC) 08/07/2023   GERD without esophagitis 08/07/2023   Coronary artery disease 08/07/2023   Type 2 diabetes mellitus with chronic kidney disease, with long-term current use of insulin  (HCC) 08/07/2023   Sepsis (HCC) 08/07/2023   Septic shock (HCC) 07/31/2023   COPD with acute exacerbation (HCC) 06/26/2023   Afib (HCC) 06/26/2023   Adjustment disorder with depressed mood 03/14/2023   Acute ST elevation myocardial infarction (STEMI) of posterior wall (HCC) 11/24/2022   HFrEF (heart failure with reduced ejection fraction) (HCC) 11/24/2022   PAF (paroxysmal atrial fibrillation) (HCC) 11/24/2022   OSA (obstructive sleep apnea) 11/24/2022   Coronary artery disease involving native coronary artery of native heart 11/24/2022  Polysubstance abuse (HCC) 11/24/2022   Hyperlipidemia 11/24/2022   Aortic atherosclerosis (HCC) 07/16/2021   Obesity (BMI 30-39.9) 07/16/2021   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/2021   Stage 3 chronic kidney disease (HCC) 07/16/2021   On mechanically assisted ventilation (HCC) 12/28/2020   Atrial fibrillation with RVR (HCC) 12/28/2020   Acute delirium 12/28/2020   Tobacco abuse 12/28/2020    Diabetes mellitus, type 2 (HCC) 12/28/2020   NSTEMI (non-ST elevated myocardial infarction) (HCC)    Acute respiratory failure with hypoxia (HCC)    Acute pulmonary embolism (HCC) 12/22/2020   History of cocaine use 11/26/2019   History of marijuana use 11/26/2019   History of illicit drug use 11/26/2019   Pharmacologic therapy 11/26/2019   Acute pain (now resolved) 11/26/2019   MRSA (methicillin resistant Staphylococcus aureus) infection 10/18/2019   Hyperosmolar non-ketotic state due to type 2 diabetes mellitus (HCC) 08/21/2019   Hyponatremia 08/19/2019   AKI (acute kidney injury) (HCC) 08/19/2019   Essential hypertension 08/19/2019   Medically noncompliant 08/19/2019   Post-traumatic osteoarthritis of right ankle 10/23/2018   Skin ulcer of right ankle with fat layer exposed (HCC) 10/23/2018   Status post ORIF of fracture of ankle 10/23/2018   Diabetic foot infection (HCC) 10/22/2018   Pressure injury of skin 02/11/2018   PCP:  Antonio Baumgarten, MD Pharmacy:   [facility] [address] [place] - [address] [place] [address] Phone: [phone] Fax: [phone]  [facility] [address] [place] [address] Phone: [phone] Fax: [phone]  [facility] [address] [place] [address] Phone: [phone] Fax: [phone]     Social Drivers of Health (SDOH) Social History: SDOH Screenings   Food Insecurity: No Food Insecurity (10/27/2023)  Housing: Low Risk  (10/27/2023)  Transportation Needs: No Transportation Needs (10/27/2023)  Utilities: Not At Risk (10/27/2023)  Depression (PHQ2-9): Low Risk  (10/30/2018)  Financial Resource Strain: Low Risk  (06/03/2023)   [facility] from [facility]  Recent Concern: Financial Resource Strain - High Risk (03/22/2023)   Received from [facility]  Social Connections:  Patient Declined (10/27/2023)  Tobacco Use: High Risk (10/26/2023)   SDOH Interventions:     Readmission Risk Interventions    10/28/2023   10:12 AM 08/09/2023    2:42 PM  Readmission Risk Prevention Plan  Transportation Screening Complete Complete  Medication Review (RN Care Manager) Complete   PCP or Specialist appointment within 3-5 days of discharge Complete   HRI or Home Care Consult Complete --  SW Recovery Care/Counseling Consult Complete   Palliative Care Screening Not Applicable Not Applicable  Skilled Nursing Facility Complete Not Applicable
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
## 2023-10-28 NOTE — Consult Note (Signed)
 PODIATRY CONSULTATION  NAME Jeremy [name] MRN [id number] DOB 06-May-1956 DOA 10/26/2023   Reason for consult:  Chief Complaint  Patient presents with   Ankle Pain    Attending/Consulting physician: K. Alekh MD  History of present illness: " 68 y.o. male with medical history significant for coronary artery disease status post CABG, chronic HFrEF, type 2 diabetes mellitus, hypertension, CKD 3A, and atrial fibrillation on Xarelto  presented with fatigue, malaise and chronic right ankle pain.  In the ED, workup showed potassium of 5.5, creatinine of 1.53 and UA suggestive of UTI.  He was started on broad-spectrum antibiotics because of history of ESBL infections in the past. "  Patient has been seen by Dr. Luster Salters in January 2024.  History from that note: "69 y.o. male with PMHx of diabetes mellitus presenting today as a referral from the [facility] wound care center for evaluation of an ulcer to the lateral aspect of the ankle.  Patient has a history of ORIF right ankle in 2002 at [facility].  This reported history is from the patient.  Patient states that the surgery did not do well and he has had a wound to the lateral aspect of the right ankle ever since the surgery over 20 years ago.  The wound probes to bone.  He has been treated at the [facility] wound care center who referred him here"  Patient states that the right ankle pain brought him into the hospital.  He does not recall his prior encounter with Dr. Luster Salters.  He was lost to follow-up.  He does report that the wound seems to have healed up after going to the wound care center.  He denies any drainage from the wound recently.  His primary complaint is pain in the right ankle.  He is ambulatory and can walk a few steps but is very painful for him.  Past Medical History:  Diagnosis Date   CHF (congestive heart failure) (HCC)    Coronary artery disease    Diabetes mellitus without complication (HCC)    Hypertension    S/P CABG x 1         Latest Ref Rng & Units 10/28/2023    3:32 AM 10/27/2023    3:38 AM 10/26/2023    5:25 PM  CBC  WBC 4.0 - 10.5 K/uL 7.0  6.2  6.6   Hemoglobin 13.0 - 17.0 g/dL 40.9  81.1  91.4   Hematocrit 39.0 - 52.0 % 37.3  35.8  37.5   Platelets 150 - 400 K/uL 250  229  253        Latest Ref Rng & Units 10/28/2023    3:32 AM 10/27/2023    3:38 AM 10/26/2023    5:25 PM  BMP  Glucose 70 - 99 mg/dL 782  956  213   BUN 8 - 23 mg/dL 25  21  23    Creatinine 0.61 - 1.24 mg/dL 0.86  5.78  4.69   Sodium 135 - 145 mmol/L 135  137  139   Potassium 3.5 - 5.1 mmol/L 4.2  4.0  5.5   Chloride 98 - 111 mmol/L 107  108  107   CO2 22 - 32 mmol/L 21  20  26    Calcium  8.9 - 10.3 mg/dL 8.5  8.9  9.8       Physical Exam: Right Lower Extremity Exam  Right foot DP and PT pulses nonpalpable  Sensation intact to light touch   Little to no motion available at  the ankle joint for dorsiflexion and plantarflexion range of motion.  Any motion that the patient has is coming from his talonavicular and midfoot joints in the sagittal plane.  He has significant valgus deformity of the foot and ankle.  Also has severe hallux valgus deformity with hallux overlap of the second toe  At the lateral ankle there is a healed ulceration with eschar and hyperkeratotic tissue overlying that is dry and stable and firm no drainage, does not appear to probe to bone at this time     ASSESSMENT/PLAN OF CARE 68 y.o. male with PMHx significant for  coronary artery disease status post CABG, chronic HFrEF, type 2 diabetes mellitus, hypertension, CKD 3A, and atrial fibrillation on Xarelto   with Charcot deformity of the right ankle status post prior fibular fracture ORIF with subsequent hardware failure and malunion of the fibula.  History of prior ulceration with concern for probe to bone and lucency around hardware raises question of osteomyelitis of the distal fibula and tibia.  WBC 7 CRP 4.7 ESR pending  XR Ankle R: Ankle fusion with  lateral plate and multi screw fixation traversing the distal fibula and hindfoot lucency about the upper aspect of the fibula plate indicating loosening  CT R ankle without contrast: Pending  ABI PVR: Pending  -Will order CT to further evaluate the right ankle.  MRI would be unhelpful given artifact from current hardware. -Ordered ESR given question of osteomyelitis considering prior ulceration overlying the hardware that probed to bone prior, CRP is somewhat elevated but not significantly -Nonpalpable pedal pulses therefore ordered ABI PVR testing in event any surgery were to be pursued - Further surgical versus conservative planning pending above, Dr. Luster Salters will assess the patient tomorrow afternoon to review the results from CT and ABI -Decision will need to be made whether to pursue limb salvage with hardware removal bone biopsy and antibiotic therapy versus amputation versus conservative care -I am not certain that just removing the hardware would be sufficient to address the patient's pain which is likely stemming from his deformity in the right ankle and possibly due to infection -Patient has UTI-antibiotics IV per culture data - Anticoagulation: Okay to continue per primary  - Wound care: None required at this time - WB status: Weightbearing as tolerated right foot will order cam walker for pain - Will continue to follow   Thank you for the consult.  Please contact me directly with any questions or concerns.           Maridee Shoemaker, DPM [facility] / [facility]    [address] [place], [place] [address]                Office [phone]  Fax [phone]

## 2023-10-29 DIAGNOSIS — N3090 Cystitis, unspecified without hematuria: Secondary | ICD-10-CM | POA: Diagnosis not present

## 2023-10-29 LAB — BASIC METABOLIC PANEL WITH GFR
Anion gap: 6 (ref 5–15)
BUN: 26 mg/dL — ABNORMAL HIGH (ref 8–23)
CO2: 22 mmol/L (ref 22–32)
Calcium: 9.2 mg/dL (ref 8.9–10.3)
Chloride: 109 mmol/L (ref 98–111)
Creatinine, Ser: 1.61 mg/dL — ABNORMAL HIGH (ref 0.61–1.24)
GFR, Estimated: 47 mL/min — ABNORMAL LOW (ref >60)
Glucose, Bld: 111 mg/dL — ABNORMAL HIGH (ref 70–99)
Potassium: 4.3 mmol/L (ref 3.5–5.1)
Sodium: 137 mmol/L (ref 135–145)

## 2023-10-29 LAB — GLUCOSE, CAPILLARY
Glucose-Capillary: 97 mg/dL (ref 70–99)
Glucose-Capillary: 155 mg/dL — ABNORMAL HIGH (ref 70–99)
Glucose-Capillary: 167 mg/dL — ABNORMAL HIGH (ref 70–99)
Glucose-Capillary: 143 mg/dL — ABNORMAL HIGH (ref 70–99)

## 2023-10-29 LAB — MAGNESIUM: Magnesium: 2 mg/dL (ref 1.7–2.4)

## 2023-10-29 NOTE — Plan of Care (Signed)
  Problem: Coping: Goal: Level of anxiety will decrease Outcome: Progressing   Problem: Elimination: Goal: Will not experience complications related to urinary retention Outcome: Progressing   Problem: Pain Managment: Goal: General experience of comfort will improve and/or be controlled Outcome: Progressing   Problem: Safety: Goal: Ability to remain free from injury will improve Outcome: Progressing

## 2023-10-29 NOTE — Plan of Care (Signed)
 Problem: Clinical Measurements: Goal: Ability to maintain clinical measurements within normal limits will improve Outcome: Progressing   Problem: Coping: Goal: Ability to adjust to condition or change in health will improve Outcome: Progressing   Problem: Activity: Goal: Risk for activity intolerance will decrease Outcome: Progressing   Problem: Safety: Goal: Ability to remain free from injury will improve Outcome: Progressing   Ara Knee, RN 10/29/23 3:48 PM

## 2023-10-29 NOTE — Plan of Care (Signed)
   Problem: Education: Goal: Ability to describe self-care measures that may prevent or decrease complications (Diabetes Survival Skills Education) will improve Outcome: Progressing Goal: Individualized Educational Video(s) Outcome: Progressing   Problem: Coping: Goal: Ability to adjust to condition or change in health will improve Outcome: Progressing

## 2023-10-29 NOTE — Progress Notes (Signed)
 Mobility Specialist - Progress Note   10/29/23 1133  Mobility  Activity Transferred from bed to chair  Level of Assistance Contact guard assist, steadying assist  Assistive Device Front wheel walker  Range of Motion/Exercises Active  Activity Response Tolerated well  Mobility Referral Yes  Mobility visit 1 Mobility  Mobility Specialist Start Time (ACUTE ONLY) 1123  Mobility Specialist Stop Time (ACUTE ONLY) 1133  Mobility Specialist Time Calculation (min) (ACUTE ONLY) 10 min   Pt was found in bed and agreeable to transfer to recliner chair, declined ambulation stating he would not put pressure on it. At EOS was left on recliner chair with all needs met. Call bell in reach and chair alarm on.  Lorna Rose Mobility Specialist

## 2023-10-29 NOTE — Progress Notes (Signed)
 PODIATRY PROGRESS NOTE  NAME Jeremy [name] MRN [id number] DOB 01-20-1956 DOA 10/26/2023    Chief Complaint  Patient presents with   Ankle Pain   ASSESSMENT/PLAN OF CARE 1.  Painful hardware right ankle 2.  Acute E. coli UTI  -Patient seen at bedside.  CT scan results reviewed with the patient.  No acute changes compared to prior imaging. Ankle stable with no clinical or radiographic concerns for infection. -No plans for intervention during this admission -Podiatry will sign off.  Recommend follow-up in office postdischarge  Past Medical History:  Diagnosis Date   CHF (congestive heart failure) (HCC)    Coronary artery disease    Diabetes mellitus without complication (HCC)    Hypertension    S/P CABG x 1        Latest Ref Rng & Units 10/28/2023    3:32 AM 10/27/2023    3:38 AM 10/26/2023    5:25 PM  CBC  WBC 4.0 - 10.5 K/uL 7.0  6.2  6.6   Hemoglobin 13.0 - 17.0 g/dL 78.2  95.6  21.3   Hematocrit 39.0 - 52.0 % 37.3  35.8  37.5   Platelets 150 - 400 K/uL 250  229  253        Latest Ref Rng & Units 10/29/2023    4:22 AM 10/28/2023    3:32 AM 10/27/2023    3:38 AM  BMP  Glucose 70 - 99 mg/dL 086  578  469   BUN 8 - 23 mg/dL 26  25  21    Creatinine 0.61 - 1.24 mg/dL 6.29  5.28  4.13   Sodium 135 - 145 mmol/L 137  135  137   Potassium 3.5 - 5.1 mmol/L 4.3  4.2  4.0   Chloride 98 - 111 mmol/L 109  107  108   CO2 22 - 32 mmol/L 22  21  20    Calcium  8.9 - 10.3 mg/dL 9.2  8.5  8.9     CT ANKLE RIGHT WO CONTRAST 10/28/2023 IMPRESSION: 1. 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 2. No definite cortical erosion adjacent to a region of soft tissue ulceration or suspected infection to specifically suggest acute osteomyelitis. 3. Unchanged lucency measuring up to approximately 8 mm in craniocaudal length just distal to the distal fibular plate, within the  far lateral aspect of the anterior process of the calcaneus, consistent with erosion from contact with the distal plate with ankle movement. 4. Unchanged generalized lucency measuring up to approximately 1.9 cm in transverse dimension and 2.5 cm in craniocaudal dimension within the distal tibial metaphysis around the mid to distal aspect of the long distal tibiofibular syndesmotic screw. This is nonspecific and may presumably is from chronic loosening. Bone infection is felt less likely given the stability. 5. Unchanged to minimally improved shallow focal soft tissue ulceration within the superolateral ankle just superior to the head of the syndesmotic screw.     Dot Gazella, DPM [facility]  Dr. Dot Gazella, DPM    [address], [place] [address]                Office [phone]  [phone]  Fax [phone]

## 2023-10-29 NOTE — Progress Notes (Addendum)
 PROGRESS NOTE    Jeremy [name]  WUJ:[id number] DOB: [birth year] DOA: 10/26/2023 PCP: Antonio Baumgarten, MD   Brief Narrative:  68 y.o. male with medical history significant for coronary artery disease status post CABG, chronic HFrEF, type 2 diabetes mellitus, hypertension, CKD 3A, and atrial fibrillation on Xarelto  presented with fatigue, malaise and chronic right ankle pain.  In the ED, workup showed potassium of 5.5, creatinine of 1.53 and UA suggestive of UTI.  He was started on broad-spectrum antibiotics because of history of ESBL infections in the past.  Assessment & Plan:   Acute E coli UTI: Present on admission -continue IV Zosyn    Hyperkalemia - Questionable cause.  Resolved  CAD Hypertension Hyperlipidemia -Stable.  Continue aspirin , statin, Coreg   Paroxysmal A-fib - Currently rate controlled.  Continue amiodarone , Coreg  and Xarelto   Chronic systolic heart failure - Currently compensated.  Continue Coreg .  Outpatient follow-up with cardiology.  Strict input output.  Daily weights.  Fluid restriction  Right ankle pain -has had chronic right ankle pain. Podiatry following.  Diabetes mellitus type 2 with hyperglycemia -Continue CBGs with SSI.  Carb modified diet  CKD stage IIIa - Creatinine currently at baseline.  Monitor intermittently  Acute metabolic acidosis -Improved  Anemia of chronic disease - From chronic kidney disease.  Hemoglobin stable.  Monitor intermittently  Obesity class I - Outpatient follow-up  Homelessness - TOC following  Physical deconditioning - PT recommending SNF placement.  TOC following.  DVT prophylaxis: Xarelto  Code Status: Full Family Communication: None at bedside Disposition Plan: Status is: inpatient because: Of severity of illness.  Need for IV antibiotics.  Need for SNF placement.    Consultants: Podiatry  Procedures: None  Antimicrobials: Zosyn  from 10/26/2023 onwards   Subjective: Patient seen and examined  at bedside.  Poor historian.  No fever or vomiting reported. Objective: Vitals:   10/28/23 0610 10/28/23 1148 10/28/23 2024 10/29/23 0635  BP: 130/73 127/66 124/84 103/70  Pulse: 95 88 84 97  Resp: 17  17 18   Temp: 98 F (36.7 C) (!) 97.4 F (36.3 C) 98.4 F (36.9 C) 98.6 F (37 C)  TempSrc:  Oral Oral Oral  SpO2: 100% 98% 100% 100%  Weight:      Height:        Intake/Output Summary (Last 24 hours) at 10/29/2023 0738 Last data filed at 10/29/2023 0644 Gross per 24 hour  Intake 1106.95 ml  Output 2775 ml  Net -1668.05 ml   Filed Weights   10/26/23 1621 10/28/23 0500  Weight: 99.8 kg 98.2 kg    Examination:  General: No acute distress. Currently on room air ENT/neck: No JVD elevation or neck masses   respiratory: Bilateral decreased breath sounds at bases bilaterally with no wheezing CVS: Currently rate controlled; S1 S2 are heard Abdominal: Soft, obese, nontender, distended mildly; no organomegaly, bowel sounds are heard normally Extremities: No clubbing; mild lower extremity edema  CNS: Awake.  Remains very slow to respond. Very poor historian.  No focal neurologic deficit.  Able to move extremities Lymph: No obvious palpable lymphadenopathy Skin: No obvious petechiae/rashes psych: Not agitated. Extremely flat affect musculoskeletal: Right ankle tenderness present    Data Reviewed: I have personally reviewed following labs and imaging studies  CBC: Recent Labs  Lab 10/25/23 2241 10/26/23 1725 10/27/23 0338 10/28/23 0332  WBC 6.8 6.6 6.2 7.0  NEUTROABS 3.1 2.7  --   --   HGB 12.2* 11.9* 11.4* 11.7*  HCT 38.5* 37.5* 35.8* 37.3*  MCV 97.5 97.7 98.4 98.7  PLT 266 253 229 250   Basic Metabolic Panel: Recent Labs  Lab 10/25/23 2241 10/26/23 1725 10/27/23 0338 10/28/23 0332 10/29/23 0422  NA 137 139 137 135 137  K 3.9 5.5* 4.0 4.2 4.3  CL 107 107 108 107 109  CO2 20* 26 20* 21* 22  GLUCOSE 136* 100* 136* 116* 111*  BUN 21 23 21  25* 26*  CREATININE  1.34* 1.53* 1.27* 1.47* 1.61*  CALCIUM  9.7 9.8 8.9 8.5* 9.2  MG  --   --   --  2.2 2.0   GFR: Estimated Creatinine Clearance: 53.9 mL/min (A) (by C-G formula based on SCr of 1.61 mg/dL (H)). Liver Function Tests: Recent Labs  Lab 10/26/23 1725  AST 18  ALT 10  ALKPHOS 70  BILITOT 0.9  PROT 7.6  ALBUMIN 3.5   No results for input(s): "LIPASE", "AMYLASE" in the last 168 hours. No results for input(s): "AMMONIA" in the last 168 hours. Coagulation Profile: No results for input(s): "INR", "PROTIME" in the last 168 hours. Cardiac Enzymes: No results for input(s): "CKTOTAL", "CKMB", "CKMBINDEX", "TROPONINI" in the last 168 hours. BNP (last 3 results) No results for input(s): "PROBNP" in the last 8760 hours. HbA1C: No results for input(s): "HGBA1C" in the last 72 hours. CBG: Recent Labs  Lab 10/27/23 2011 10/28/23 0745 10/28/23 1145 10/28/23 1646 10/28/23 2002  GLUCAP 131* 110* 96 102* 113*   Lipid Profile: No results for input(s): "CHOL", "HDL", "LDLCALC", "TRIG", "CHOLHDL", "LDLDIRECT" in the last 72 hours. Thyroid  Function Tests: No results for input(s): "TSH", "T4TOTAL", "FREET4", "T3FREE", "THYROIDAB" in the last 72 hours. Anemia Panel: No results for input(s): "VITAMINB12", "FOLATE", "FERRITIN", "TIBC", "IRON", "RETICCTPCT" in the last 72 hours. Sepsis Labs: Recent Labs  Lab 10/26/23 1725  LATICACIDVEN 1.3    Recent Results (from the past 240 hours)  Resp panel by RT-PCR (RSV, Flu A&B, Covid) Anterior Nasal Swab     Status: None   Collection Time: 10/25/23 10:42 PM   Specimen: Anterior Nasal Swab  Result Value Ref Range Status   SARS Coronavirus 2 by RT PCR NEGATIVE NEGATIVE Final    Comment: (NOTE) SARS-CoV-2 target nucleic acids are NOT DETECTED.  The SARS-CoV-2 RNA is generally detectable in upper respiratory specimens during the acute phase of infection. The lowest concentration of SARS-CoV-2 viral copies this assay can detect is 138 copies/mL. A  negative result does not preclude SARS-Cov-2 infection and should not be used as the sole basis for treatment or other patient management decisions. A negative result may occur with  improper specimen collection/handling, submission of specimen other than nasopharyngeal swab, presence of viral mutation(s) within the areas targeted by this assay, and inadequate number of viral copies(<138 copies/mL). A negative result must be combined with clinical observations, patient history, and epidemiological information. The expected result is Negative.  Fact Sheet for Patients:  BloggerCourse.com  Fact Sheet for Healthcare Providers:  SeriousBroker.it  This test is no t yet approved or cleared by the [place]  FDA and  has been authorized for detection and/or diagnosis of SARS-CoV-2 by FDA under an Emergency Use Authorization (EUA). This EUA will remain  in effect (meaning this test can be used) for the duration of the COVID-19 declaration under Section 564(b)(1) of the Act, 21 U.S.C.section 360bbb-3(b)(1), unless the authorization is terminated  or revoked sooner.       Influenza A by PCR NEGATIVE NEGATIVE Final   Influenza B by PCR NEGATIVE NEGATIVE Final    Comment: (NOTE) The Xpert Xpress SARS-CoV-2/FLU/RSV  plus assay is intended as an aid in the diagnosis of influenza from Nasopharyngeal swab specimens and should not be used as a sole basis for treatment. Nasal washings and aspirates are unacceptable for Xpert Xpress SARS-CoV-2/FLU/RSV testing.  Fact Sheet for Patients: BloggerCourse.com  Fact Sheet for Healthcare Providers: SeriousBroker.it  This test is not yet approved or cleared by the [place]  FDA and has been authorized for detection and/or diagnosis of SARS-CoV-2 by FDA under an Emergency Use Authorization (EUA). This EUA will remain in effect (meaning this test can  be used) for the duration of the COVID-19 declaration under Section 564(b)(1) of the Act, 21 U.S.C. section 360bbb-3(b)(1), unless the authorization is terminated or revoked.     Resp Syncytial Virus by PCR NEGATIVE NEGATIVE Final    Comment: (NOTE) Fact Sheet for Patients: BloggerCourse.com  Fact Sheet for Healthcare Providers: SeriousBroker.it  This test is not yet approved or cleared by the [place]  FDA and has been authorized for detection and/or diagnosis of SARS-CoV-2 by FDA under an Emergency Use Authorization (EUA). This EUA will remain in effect (meaning this test can be used) for the duration of the COVID-19 declaration under Section 564(b)(1) of the Act, 21 U.S.C. section 360bbb-3(b)(1), unless the authorization is terminated or revoked.  Performed at [facility], [address], [place], [place] [address]   Blood culture (routine x 2)     Status: None (Preliminary result)   Collection Time: 10/26/23  5:25 PM   Specimen: BLOOD  Result Value Ref Range Status   Specimen Description   Final    BLOOD RIGHT ANTECUBITAL Performed at [facility], [address] [place], [place] [address]    Special Requests   Final    BOTTLES DRAWN AEROBIC AND ANAEROBIC Blood Culture adequate volume Performed at [facility], [address], [place], [place] [address]    Culture   Final    NO GROWTH 2 DAYS Performed at [facility], [address] [place], [place] [address]    Report Status PENDING  Incomplete  Blood culture (routine x 2)     Status: None (Preliminary result)   Collection Time: 10/26/23  5:30 PM   Specimen: BLOOD RIGHT FOREARM  Result Value Ref Range Status   Specimen Description   Final    BLOOD RIGHT FOREARM Performed at [facility], [address], [place], [place] [address]    Special Requests   Final    BOTTLES DRAWN AEROBIC AND  ANAEROBIC Blood Culture results may not be optimal due to an inadequate volume of blood received in culture bottles Performed at [facility], [address], [place], [place] [address]    Culture   Final    NO GROWTH 2 DAYS Performed at [facility], [address] [place], [place] [address]    Report Status PENDING  Incomplete  Urine Culture     Status: Abnormal   Collection Time: 10/26/23  6:50 PM   Specimen: Urine, Random  Result Value Ref Range Status   Specimen Description   Final    URINE, RANDOM Performed at [facility], [address] [place], [place] [address]    Special Requests   Final    NONE Reflexed from [id number] Performed at [facility], [address], [place], [place] [address]    Culture (A)  Final    >=100,000 COLONIES/mL ESCHERICHIA COLI Confirmed Extended Spectrum Beta-Lactamase  Producer (ESBL).  In bloodstream infections from ESBL organisms, carbapenems are preferred over piperacillin /tazobactam. They are shown to have a lower risk of mortality.    Report Status 10/28/2023 FINAL  Final   Organism ID, Bacteria ESCHERICHIA COLI (A)  Final      Susceptibility   Escherichia coli - MIC*    AMPICILLIN >=32 RESISTANT Resistant     CEFAZOLIN  >=64 RESISTANT Resistant     CEFEPIME  16 RESISTANT Resistant     CEFTRIAXONE >=64 RESISTANT Resistant     CIPROFLOXACIN  >=4 RESISTANT Resistant     GENTAMICIN <=1 SENSITIVE Sensitive     IMIPENEM <=0.25 SENSITIVE Sensitive     NITROFURANTOIN 128 RESISTANT Resistant     TRIMETH /SULFA  <=20 SENSITIVE Sensitive     AMPICILLIN/SULBACTAM >=32 RESISTANT Resistant     PIP/TAZO 8 SENSITIVE Sensitive ug/mL    * >=100,000 COLONIES/mL ESCHERICHIA COLI         Radiology Studies: CT ANKLE RIGHT WO CONTRAST Result Date: 10/28/2023 CLINICAL DATA:  Evaluate for fibular malunion. Charcot arthropathy versus osteomyelitis in distal fibula/tibia. EXAM: CT OF THE RIGHT ANKLE  WITHOUT CONTRAST TECHNIQUE: Multidetector CT imaging of the right ankle was performed according to the standard protocol. Multiplanar CT image reconstructions were also generated. RADIATION DOSE REDUCTION: This exam was performed according to the departmental dose-optimization program which includes automated exposure control, adjustment of the mA and/or kV according to patient size and/or use of iterative reconstruction technique. 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 There is diffuse sclerosis throughout both fracture components. Postsurgical changes are again seen of lateral plate and screw fixation of the distal fibular diaphysis through the lateral tibia. The most distal screw again has no purchase on the distal talus (coronal series 7 images 38 through 43). Unchanged lucency measuring up to approximately 8 mm in craniocaudal length just distal to the distal fibular plate, within the far lateral aspect of the anterior process of the calcaneus, consistent with erosion from contact with the distal plate with ankle movement. There is unchanged 2 mm lucency around the far proximal aspect of the fibular plate within the fibular diaphysis (coronal series 7, image 50). There is again a generalized lucency measuring up to approximately 1.9 cm in transverse dimension and 2.5 cm in craniocaudal dimension within the distal tibial metaphysis (coronal series 7, image 39) around the mid to distal aspect of the long distal tibiofibular syndesmotic screw. There is again solid tibiotalar fusion. There is again bone-on-bone contact of the distal fibula and the posterior aspect of the lateral talus with partial fusion, very similar to prior CT (coronal series 7 images 42 through 48). Moderate plantar and  mild posterior calcaneal heel spurs. No acute fracture or dislocation. Ligaments Suboptimally assessed by CT. Muscles and Tendons Mild fatty infiltration of the distal soleus muscle, unchanged. No gross tendon tear is seen. Soft tissues Mild diffuse soft tissue swelling, greatest within the lateral ankle and dorsal midfoot. Unchanged to minimally improved shallow focal soft tissue 2 ulceration 2 within the superolateral ankle just superior to the head of the syndesmotic screw (coronal series 8, image 44 and axial series 5, image 96). IMPRESSION: 1. 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 2. No definite cortical erosion adjacent to a region of soft tissue ulceration  or suspected infection to specifically suggest acute osteomyelitis. 3. Unchanged lucency measuring up to approximately 8 mm in craniocaudal length just distal to the distal fibular plate, within the far lateral aspect of the anterior process of the calcaneus, consistent with erosion from contact with the distal plate with ankle movement. 4. Unchanged generalized lucency measuring up to approximately 1.9 cm in transverse dimension and 2.5 cm in craniocaudal dimension within the distal tibial metaphysis around the mid to distal aspect of the long distal tibiofibular syndesmotic screw. This is nonspecific and may presumably is from chronic loosening. Bone infection is felt less likely given the stability. 5. Unchanged to minimally improved shallow focal soft tissue ulceration within the superolateral ankle just superior to the head of the syndesmotic screw. Electronically Signed   By: Bertina Broccoli M.D.   On: 10/28/2023 19:49         Scheduled Meds:  amiodarone   200 mg Oral Daily   aspirin   81 mg Oral Daily   atorvastatin   40 mg Oral Daily   carvedilol   3.125 mg Oral BID   insulin  aspart  0-5 Units Subcutaneous QHS   insulin  aspart   0-6 Units Subcutaneous TID WC   rivaroxaban   20 mg Oral Daily   senna-docusate  1 tablet Oral BID   Continuous Infusions:  piperacillin -tazobactam (ZOSYN )  IV 3.375 g (10/29/23 0509)          Audria Leather, MD [facility] 10/29/2023, 7:38 AM

## 2023-10-30 ENCOUNTER — Inpatient Hospital Stay (HOSPITAL_COMMUNITY)

## 2023-10-30 DIAGNOSIS — I7090 Unspecified atherosclerosis: Secondary | ICD-10-CM

## 2023-10-30 DIAGNOSIS — N3090 Cystitis, unspecified without hematuria: Secondary | ICD-10-CM | POA: Diagnosis not present

## 2023-10-30 LAB — BASIC METABOLIC PANEL WITH GFR
Anion gap: 8 (ref 5–15)
BUN: 28 mg/dL — ABNORMAL HIGH (ref 8–23)
CO2: 20 mmol/L — ABNORMAL LOW (ref 22–32)
Calcium: 9.3 mg/dL (ref 8.9–10.3)
Chloride: 108 mmol/L (ref 98–111)
Creatinine, Ser: 1.6 mg/dL — ABNORMAL HIGH (ref 0.61–1.24)
GFR, Estimated: 47 mL/min — ABNORMAL LOW (ref >60)
Glucose, Bld: 110 mg/dL — ABNORMAL HIGH (ref 70–99)
Potassium: 4.3 mmol/L (ref 3.5–5.1)
Sodium: 136 mmol/L (ref 135–145)

## 2023-10-30 LAB — GLUCOSE, CAPILLARY
Glucose-Capillary: 115 mg/dL — ABNORMAL HIGH (ref 70–99)
Glucose-Capillary: 138 mg/dL — ABNORMAL HIGH (ref 70–99)
Glucose-Capillary: 166 mg/dL — ABNORMAL HIGH (ref 70–99)
Glucose-Capillary: 90 mg/dL (ref 70–99)

## 2023-10-30 LAB — MAGNESIUM: Magnesium: 2.1 mg/dL (ref 1.7–2.4)

## 2023-10-30 NOTE — TOC Progression Note (Signed)
 Transition of Care [facility]) - Progression Note    Patient Details  Name: Jeremy [name] MRN: [id number] Date of Birth: [birth year]  [facility] CM/SW Contact  Bari Leys, RN Phone Number: 10/30/2023, 3:31 PM  Clinical Narrative:   Met with pt at bedside to present short term rehab/SNF bed offers ( [address], [address], [place], [address], [place], [place]), list left with patient per his request to review, will follow up tomorrow for facility choice.     Expected Discharge Plan: Skilled Nursing Facility    Expected Discharge Plan and Services       Living arrangements for the past 2 months: Homeless                                       Social Determinants of Health (SDOH) Interventions SDOH Screenings   Food Insecurity: No Food Insecurity (10/27/2023)  Housing: Low Risk  (10/27/2023)  Transportation Needs: No Transportation Needs (10/27/2023)  Utilities: Not At Risk (10/27/2023)  Depression (PHQ2-9): Low Risk  (10/30/2018)  Financial Resource Strain: Low Risk  (06/03/2023)   [facility]  Recent Concern: Financial Resource Strain - High Risk (03/22/2023)   Received from [facility]  Social Connections: Patient Declined (10/27/2023)  Tobacco Use: High Risk (10/26/2023)    Readmission Risk Interventions    10/28/2023   10:12 AM 08/09/2023    2:42 PM  Readmission Risk Prevention Plan  Transportation Screening Complete Complete  Medication Review (RN Care Manager) Complete   PCP or Specialist appointment within 3-5 days of discharge Complete   HRI or Home Care Consult Complete --  SW Recovery Care/Counseling Consult Complete   Palliative Care Screening Not Applicable Not Applicable  Skilled Nursing Facility Complete Not Applicable

## 2023-10-30 NOTE — Progress Notes (Signed)
 PROGRESS NOTE    Jeremy [name]  LKG:[id number] DOB: [birth year] DOA: 10/26/2023 PCP: Antonio Baumgarten, MD   Brief Narrative:  68 y.o. male with medical history significant for coronary artery disease status post CABG, chronic HFrEF, type 2 diabetes mellitus, hypertension, CKD 3A, and atrial fibrillation on Xarelto  presented with fatigue, malaise and chronic right ankle pain.  In the ED, workup showed potassium of 5.5, creatinine of 1.53 and UA suggestive of UTI.  He was started on broad-spectrum antibiotics because of history of ESBL infections in the past.  Assessment & Plan:   Acute E coli UTI: Present on admission -continue IV Zosyn . Finish 7 day course  Hyperkalemia - Questionable cause.  Resolved  CAD Hypertension Hyperlipidemia -Stable.  Continue aspirin , statin, Coreg   Paroxysmal A-fib - Currently rate controlled.  Continue amiodarone , Coreg  and Xarelto   Chronic systolic heart failure - Currently compensated.  Continue Coreg .  Outpatient follow-up with cardiology.  Strict input output.  Daily weights.  Fluid restriction  Right ankle pain -has had chronic right ankle pain. Podiatry recommended conservative management and signed out.  Diabetes mellitus type 2 with hyperglycemia -Continue CBGs with SSI.  Carb modified diet  CKD stage IIIa - Creatinine currently at baseline.  Monitor intermittently  Acute metabolic acidosis -Mild. Monitor  Anemia of chronic disease - From chronic kidney disease.  Hemoglobin stable.  Monitor intermittently  Obesity class I - Outpatient follow-up  Homelessness - TOC following  Physical deconditioning - PT recommending SNF placement.  TOC following.  DVT prophylaxis: Xarelto  Code Status: Full Family Communication: None at bedside Disposition Plan: Status is: inpatient because: Of severity of illness.  Need for IV antibiotics.  Need for SNF placement.    Consultants: Podiatry  Procedures: None  Antimicrobials: Zosyn   from 10/26/2023 onwards   Subjective: Patient seen and examined at bedside.  Poor historian.  No abdominal pain, fever, vomiting reported. Objective: Vitals:   10/29/23 1409 10/29/23 2142 10/30/23 0500 10/30/23 0534  BP: 120/75 (!) 149/70  121/68  Pulse: (!) 106 88  95  Resp: 18 16  16   Temp: 98.3 F (36.8 C) 97.7 F (36.5 C)  98.6 F (37 C)  TempSrc:  Oral    SpO2: 98% 99%  97%  Weight:   101.7 kg   Height:        Intake/Output Summary (Last 24 hours) at 10/30/2023 0736 Last data filed at 10/30/2023 0600 Gross per 24 hour  Intake 1080 ml  Output 1700 ml  Net -620 ml   Filed Weights   10/28/23 0500 10/29/23 0500 10/30/23 0500  Weight: 98.2 kg 103.3 kg 101.7 kg    Examination:  General: On room air. No distress  ENT/neck: No thyromegaly or elevated JVD noted   respiratory: Decreased breath sounds at bases with scattered crackles CVS: Rate mostly controlled; S1 S2 heard Abdominal: Soft, obese, nontender, distended slightly; no organomegaly, bowel sounds normally heard Extremities: Trace lower extremity edema; no cyanosis CNS: Alert; still slow to respond. Very poor historian.  No obvious focal neurologic deficit.  Lymph: No palpable lymphadenopathy Skin: No obvious ecchymosis/lesions psych: Flat affect. Currently not agitated musculoskeletal: Mild right ankle tenderness present    Data Reviewed: I have personally reviewed following labs and imaging studies  CBC: Recent Labs  Lab 10/25/23 2241 10/26/23 1725 10/27/23 0338 10/28/23 0332  WBC 6.8 6.6 6.2 7.0  NEUTROABS 3.1 2.7  --   --   HGB 12.2* 11.9* 11.4* 11.7*  HCT 38.5* 37.5* 35.8* 37.3*  MCV 97.5 97.7  98.4 98.7  PLT 266 253 229 250   Basic Metabolic Panel: Recent Labs  Lab 10/26/23 1725 10/27/23 0338 10/28/23 0332 10/29/23 0422 10/30/23 0356  NA 139 137 135 137 136  K 5.5* 4.0 4.2 4.3 4.3  CL 107 108 107 109 108  CO2 26 20* 21* 22 20*  GLUCOSE 100* 136* 116* 111* 110*  BUN 23 21 25* 26* 28*   CREATININE 1.53* 1.27* 1.47* 1.61* 1.60*  CALCIUM  9.8 8.9 8.5* 9.2 9.3  MG  --   --  2.2 2.0 2.1   GFR: Estimated Creatinine Clearance: 55.2 mL/min (A) (by C-G formula based on SCr of 1.6 mg/dL (H)). Liver Function Tests: Recent Labs  Lab 10/26/23 1725  AST 18  ALT 10  ALKPHOS 70  BILITOT 0.9  PROT 7.6  ALBUMIN 3.5   No results for input(s): "LIPASE", "AMYLASE" in the last 168 hours. No results for input(s): "AMMONIA" in the last 168 hours. Coagulation Profile: No results for input(s): "INR", "PROTIME" in the last 168 hours. Cardiac Enzymes: No results for input(s): "CKTOTAL", "CKMB", "CKMBINDEX", "TROPONINI" in the last 168 hours. BNP (last 3 results) No results for input(s): "PROBNP" in the last 8760 hours. HbA1C: No results for input(s): "HGBA1C" in the last 72 hours. CBG: Recent Labs  Lab 10/28/23 2002 10/29/23 0753 10/29/23 1215 10/29/23 1735 10/29/23 2247  GLUCAP 113* 97 155* 167* 143*   Lipid Profile: No results for input(s): "CHOL", "HDL", "LDLCALC", "TRIG", "CHOLHDL", "LDLDIRECT" in the last 72 hours. Thyroid  Function Tests: No results for input(s): "TSH", "T4TOTAL", "FREET4", "T3FREE", "THYROIDAB" in the last 72 hours. Anemia Panel: No results for input(s): "VITAMINB12", "FOLATE", "FERRITIN", "TIBC", "IRON", "RETICCTPCT" in the last 72 hours. Sepsis Labs: Recent Labs  Lab 10/26/23 1725  LATICACIDVEN 1.3    Recent Results (from the past 240 hours)  Resp panel by RT-PCR (RSV, Flu A&B, Covid) Anterior Nasal Swab     Status: None   Collection Time: 10/25/23 10:42 PM   Specimen: Anterior Nasal Swab  Result Value Ref Range Status   SARS Coronavirus 2 by RT PCR NEGATIVE NEGATIVE Final    Comment: (NOTE) SARS-CoV-2 target nucleic acids are NOT DETECTED.  The SARS-CoV-2 RNA is generally detectable in upper respiratory specimens during the acute phase of infection. The lowest concentration of SARS-CoV-2 viral copies this assay can detect is 138  copies/mL. A negative result does not preclude SARS-Cov-2 infection and should not be used as the sole basis for treatment or other patient management decisions. A negative result may occur with  improper specimen collection/handling, submission of specimen other than nasopharyngeal swab, presence of viral mutation(s) within the areas targeted by this assay, and inadequate number of viral copies(<138 copies/mL). A negative result must be combined with clinical observations, patient history, and epidemiological information. The expected result is Negative.  Fact Sheet for Patients:  BloggerCourse.com  Fact Sheet for Healthcare Providers:  SeriousBroker.it  This test is no t yet approved or cleared by the [place]  FDA and  has been authorized for detection and/or diagnosis of SARS-CoV-2 by FDA under an Emergency Use Authorization (EUA). This EUA will remain  in effect (meaning this test can be used) for the duration of the COVID-19 declaration under Section 564(b)(1) of the Act, 21 U.S.C.section 360bbb-3(b)(1), unless the authorization is terminated  or revoked sooner.       Influenza A by PCR NEGATIVE NEGATIVE Final   Influenza B by PCR NEGATIVE NEGATIVE Final    Comment: (NOTE) The Xpert Xpress  SARS-CoV-2/FLU/RSV plus assay is intended as an aid in the diagnosis of influenza from Nasopharyngeal swab specimens and should not be used as a sole basis for treatment. Nasal washings and aspirates are unacceptable for Xpert Xpress SARS-CoV-2/FLU/RSV testing.  Fact Sheet for Patients: BloggerCourse.com  Fact Sheet for Healthcare Providers: SeriousBroker.it  This test is not yet approved or cleared by the [place]  FDA and has been authorized for detection and/or diagnosis of SARS-CoV-2 by FDA under an Emergency Use Authorization (EUA). This EUA will remain in effect (meaning  this test can be used) for the duration of the COVID-19 declaration under Section 564(b)(1) of the Act, 21 U.S.C. section 360bbb-3(b)(1), unless the authorization is terminated or revoked.     Resp Syncytial Virus by PCR NEGATIVE NEGATIVE Final    Comment: (NOTE) Fact Sheet for Patients: BloggerCourse.com  Fact Sheet for Healthcare Providers: SeriousBroker.it  This test is not yet approved or cleared by the [place]  FDA and has been authorized for detection and/or diagnosis of SARS-CoV-2 by FDA under an Emergency Use Authorization (EUA). This EUA will remain in effect (meaning this test can be used) for the duration of the COVID-19 declaration under Section 564(b)(1) of the Act, 21 U.S.C. section 360bbb-3(b)(1), unless the authorization is terminated or revoked.  Performed at [facility], [address] [place], [place] [address]   Blood culture (routine x 2)     Status: None (Preliminary result)   Collection Time: 10/26/23  5:25 PM   Specimen: BLOOD  Result Value Ref Range Status   Specimen Description   Final    BLOOD RIGHT ANTECUBITAL Performed at [facility], [address] [place], [place] [address]    Special Requests   Final    BOTTLES DRAWN AEROBIC AND ANAEROBIC Blood Culture adequate volume Performed at [facility], [address] [place], [place] [address]    Culture   Final    NO GROWTH 4 DAYS Performed at [facility], [address] [place], [place] [address]    Report Status PENDING  Incomplete  Blood culture (routine x 2)     Status: None (Preliminary result)   Collection Time: 10/26/23  5:30 PM   Specimen: BLOOD RIGHT FOREARM  Result Value Ref Range Status   Specimen Description   Final    BLOOD RIGHT FOREARM Performed at [facility], [address], [place], [place] [address]    Special Requests   Final    BOTTLES DRAWN  AEROBIC AND ANAEROBIC Blood Culture results may not be optimal due to an inadequate volume of blood received in culture bottles Performed at [facility], [address], [place], [place] [address]    Culture   Final    NO GROWTH 4 DAYS Performed at [facility], [address], [place], [place] [address]    Report Status PENDING  Incomplete  Urine Culture     Status: Abnormal   Collection Time: 10/26/23  6:50 PM   Specimen: Urine, Random  Result Value Ref Range Status   Specimen Description   Final    URINE, RANDOM Performed at [facility], [address] [place], [place] [address]    Special Requests   Final    NONE Reflexed from [id number] Performed at [facility], [address], [place], [place] [address]    Culture (A)  Final    >=100,000 COLONIES/mL ESCHERICHIA COLI Confirmed Extended Spectrum  Beta-Lactamase Producer (ESBL).  In bloodstream infections from ESBL organisms, carbapenems are preferred over piperacillin /tazobactam. They are shown to have a lower risk of mortality.    Report Status 10/28/2023 FINAL  Final   Organism ID, Bacteria ESCHERICHIA COLI (A)  Final      Susceptibility   Escherichia coli - MIC*    AMPICILLIN >=32 RESISTANT Resistant     CEFAZOLIN  >=64 RESISTANT Resistant     CEFEPIME  16 RESISTANT Resistant     CEFTRIAXONE >=64 RESISTANT Resistant     CIPROFLOXACIN  >=4 RESISTANT Resistant     GENTAMICIN <=1 SENSITIVE Sensitive     IMIPENEM <=0.25 SENSITIVE Sensitive     NITROFURANTOIN 128 RESISTANT Resistant     TRIMETH /SULFA  <=20 SENSITIVE Sensitive     AMPICILLIN/SULBACTAM >=32 RESISTANT Resistant     PIP/TAZO 8 SENSITIVE Sensitive ug/mL    * >=100,000 COLONIES/mL ESCHERICHIA COLI         Radiology Studies: CT ANKLE RIGHT WO CONTRAST Result Date: 10/28/2023 CLINICAL DATA:  Evaluate for fibular malunion. Charcot arthropathy versus osteomyelitis in distal fibula/tibia. EXAM: CT OF THE  RIGHT ANKLE WITHOUT CONTRAST TECHNIQUE: Multidetector CT imaging of the right ankle was performed according to the standard protocol. Multiplanar CT image reconstructions were also generated. RADIATION DOSE REDUCTION: This exam was performed according to the departmental dose-optimization program which includes automated exposure control, adjustment of the mA and/or kV according to patient size and/or use of iterative reconstruction technique. 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 There is diffuse sclerosis throughout both fracture components. Postsurgical changes are again seen of lateral plate and screw fixation of the distal fibular diaphysis through the lateral tibia. The most distal screw again has no purchase on the distal talus (coronal series 7 images 38 through 43). Unchanged lucency measuring up to approximately 8 mm in craniocaudal length just distal to the distal fibular plate, within the far lateral aspect of the anterior process of the calcaneus, consistent with erosion from contact with the distal plate with ankle movement. There is unchanged 2 mm lucency around the far proximal aspect of the fibular plate within the fibular diaphysis (coronal series 7, image 50). There is again a generalized lucency measuring up to approximately 1.9 cm in transverse dimension and 2.5 cm in craniocaudal dimension within the distal tibial metaphysis (coronal series 7, image 39) around the mid to distal aspect of the long distal tibiofibular syndesmotic screw. There is again solid tibiotalar fusion. There is again bone-on-bone contact of the distal fibula and the posterior aspect of the lateral talus with partial fusion, very similar to prior CT (coronal series 7 images 42 through 48). Moderate  plantar and mild posterior calcaneal heel spurs. No acute fracture or dislocation. Ligaments Suboptimally assessed by CT. Muscles and Tendons Mild fatty infiltration of the distal soleus muscle, unchanged. No gross tendon tear is seen. Soft tissues Mild diffuse soft tissue swelling, greatest within the lateral ankle and dorsal midfoot. Unchanged to minimally improved shallow focal soft tissue 2 ulceration 2 within the superolateral ankle just superior to the head of the syndesmotic screw (coronal series 8, image 44 and axial series 5, image 96). IMPRESSION: 1. 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 2. No definite cortical erosion adjacent to a region of soft tissue  ulceration or suspected infection to specifically suggest acute osteomyelitis. 3. Unchanged lucency measuring up to approximately 8 mm in craniocaudal length just distal to the distal fibular plate, within the far lateral aspect of the anterior process of the calcaneus, consistent with erosion from contact with the distal plate with ankle movement. 4. Unchanged generalized lucency measuring up to approximately 1.9 cm in transverse dimension and 2.5 cm in craniocaudal dimension within the distal tibial metaphysis around the mid to distal aspect of the long distal tibiofibular syndesmotic screw. This is nonspecific and may presumably is from chronic loosening. Bone infection is felt less likely given the stability. 5. Unchanged to minimally improved shallow focal soft tissue ulceration within the superolateral ankle just superior to the head of the syndesmotic screw. Electronically Signed   By: Bertina Broccoli M.D.   On: 10/28/2023 19:49         Scheduled Meds:  amiodarone   200 mg Oral Daily   aspirin   81 mg Oral Daily   atorvastatin   40 mg Oral Daily   carvedilol   3.125 mg Oral BID   insulin  aspart  0-5 Units Subcutaneous QHS    insulin  aspart  0-6 Units Subcutaneous TID WC   rivaroxaban   20 mg Oral Daily   senna-docusate  1 tablet Oral BID   Continuous Infusions:  piperacillin -tazobactam (ZOSYN )  IV 3.375 g (10/30/23 0355)          Audria Leather, MD [facility] 10/30/2023, 7:36 AM

## 2023-10-30 NOTE — Progress Notes (Signed)
 Mobility Specialist - Progress Note   10/30/23 1407  Mobility  Activity Transferred from bed to chair  Level of Assistance Contact guard assist, steadying assist  Assistive Device None (Pt declined RW)  Range of Motion/Exercises Active  Activity Response Tolerated well  Mobility Referral Yes  Mobility visit 1 Mobility  Mobility Specialist Start Time (ACUTE ONLY) 1355  Mobility Specialist Stop Time (ACUTE ONLY) 1405  Mobility Specialist Time Calculation (min) (ACUTE ONLY) 10 min   Pt was found in bed and agreeable to transfer to recliner chair. At EOS was left on recliner chair with all needs met. Call bell in reach and chair alarm on.  Lorna Rose,  Mobility Specialist Can be reached via Secure Chat

## 2023-10-30 NOTE — Progress Notes (Signed)
 ABI exam has been completed.   Results can be found under chart review under CV PROC. 10/30/2023 10:50 AM Meckenzie Balsley RVT, RDMS

## 2023-10-30 NOTE — Plan of Care (Signed)

## 2023-10-31 DIAGNOSIS — N3090 Cystitis, unspecified without hematuria: Secondary | ICD-10-CM | POA: Diagnosis not present

## 2023-10-31 LAB — GLUCOSE, CAPILLARY
Glucose-Capillary: 114 mg/dL — ABNORMAL HIGH (ref 70–99)
Glucose-Capillary: 115 mg/dL — ABNORMAL HIGH (ref 70–99)
Glucose-Capillary: 156 mg/dL — ABNORMAL HIGH (ref 70–99)
Glucose-Capillary: 333 mg/dL — ABNORMAL HIGH (ref 70–99)
Glucose-Capillary: 148 mg/dL — ABNORMAL HIGH (ref 70–99)

## 2023-10-31 LAB — BASIC METABOLIC PANEL WITH GFR
Anion gap: 9 (ref 5–15)
BUN: 29 mg/dL — ABNORMAL HIGH (ref 8–23)
CO2: 22 mmol/L (ref 22–32)
Calcium: 9.7 mg/dL (ref 8.9–10.3)
Chloride: 105 mmol/L (ref 98–111)
Creatinine, Ser: 1.97 mg/dL — ABNORMAL HIGH (ref 0.61–1.24)
GFR, Estimated: 37 mL/min — ABNORMAL LOW (ref >60)
Glucose, Bld: 143 mg/dL — ABNORMAL HIGH (ref 70–99)
Potassium: 4.3 mmol/L (ref 3.5–5.1)
Sodium: 136 mmol/L (ref 135–145)

## 2023-10-31 LAB — VAS US ABI WITH/WO TBI
Left ABI: 1.15
Right ABI: 1.13

## 2023-10-31 LAB — CBC WITH DIFFERENTIAL/PLATELET
Abs Immature Granulocytes: 0.01 10*3/uL (ref 0.00–0.07)
Basophils Absolute: 0.1 10*3/uL (ref 0.0–0.1)
Basophils Relative: 2 %
Eosinophils Absolute: 0.3 10*3/uL (ref 0.0–0.5)
Eosinophils Relative: 6 %
HCT: 44.1 % (ref 39.0–52.0)
Hemoglobin: 13.7 g/dL (ref 13.0–17.0)
Immature Granulocytes: 0 %
Lymphocytes Relative: 41 %
Lymphs Abs: 2.2 10*3/uL (ref 0.7–4.0)
MCH: 30.3 pg (ref 26.0–34.0)
MCHC: 31.1 g/dL (ref 30.0–36.0)
MCV: 97.6 fL (ref 80.0–100.0)
Monocytes Absolute: 0.7 10*3/uL (ref 0.1–1.0)
Monocytes Relative: 13 %
Neutro Abs: 2 10*3/uL (ref 1.7–7.7)
Neutrophils Relative %: 38 %
Platelets: 322 10*3/uL (ref 150–400)
RBC: 4.52 MIL/uL (ref 4.22–5.81)
RDW: 14.1 % (ref 11.5–15.5)
WBC: 5.3 10*3/uL (ref 4.0–10.5)
nRBC: 0 % (ref 0.0–0.2)

## 2023-10-31 LAB — CULTURE, BLOOD (ROUTINE X 2)
Culture: NO GROWTH
Culture: NO GROWTH
Special Requests: ADEQUATE

## 2023-10-31 LAB — C-REACTIVE PROTEIN: CRP: 1.5 mg/dL — ABNORMAL HIGH (ref <1.0)

## 2023-10-31 LAB — MAGNESIUM: Magnesium: 2.2 mg/dL (ref 1.7–2.4)

## 2023-10-31 MED ORDER — SODIUM CHLORIDE 0.9 % IV SOLN: INTRAVENOUS | Status: DC

## 2023-10-31 NOTE — Progress Notes (Signed)
 PROGRESS NOTE    Jeremy [name]  XNA:[id number] DOB: Jan 27, 1956 DOA: 10/26/2023 PCP: Antonio Baumgarten, MD   Brief Narrative:  68 y.o. male with medical history significant for coronary artery disease status post CABG, chronic HFrEF, type 2 diabetes mellitus, hypertension, CKD 3A, and atrial fibrillation on Xarelto  presented with fatigue, malaise and chronic right ankle pain.  In the ED, workup showed potassium of 5.5, creatinine of 1.53 and UA suggestive of UTI.  He was started on broad-spectrum antibiotics.  Subsequently, urine culture grew E. coli.  PT recommended SNF placement.  TOC consulted.  Assessment & Plan:   Acute E coli UTI: Present on admission -continue IV Zosyn . Finish 7 day course  Hyperkalemia - Questionable cause.  Resolved  CAD Hypertension Hyperlipidemia -Stable.  Continue aspirin , statin, Coreg   Paroxysmal A-fib - Currently rate controlled.  Continue amiodarone , Coreg  and Xarelto   Chronic systolic heart failure - Currently compensated.  Continue Coreg .  Outpatient follow-up with cardiology.  Strict input output.  Daily weights.  Fluid restriction  Right ankle pain -has had chronic right ankle pain. Podiatry recommended conservative management and signed out.  Outpatient follow-up with podiatry  Diabetes mellitus type 2 with hyperglycemia -Continue CBGs with SSI.  Carb modified diet  AKI CKD stage IIIa - Baseline creatinine of 1.3-1.6.  Creatinine 1.97 today.  Questionable cause.  Start gentle hydration.  Repeat a.m. labs  Acute metabolic acidosis - Improved  Anemia of chronic disease - From chronic kidney disease.  Hemoglobin stable.  Monitor intermittently  Obesity class I - Outpatient follow-up  Homelessness - TOC following  Physical deconditioning - PT recommending SNF placement.  TOC following.  DVT prophylaxis: Xarelto  Code Status: Full Family Communication: None at bedside Disposition Plan: Status is: inpatient because: Of severity  of illness.  Need for IV antibiotics and fluids.  Need for SNF placement.    Consultants: Podiatry  Procedures: None  Antimicrobials: Zosyn  from 10/26/2023 onwards   Subjective: Patient seen and examined at bedside.  Poor historian.  No fever, agitation, seizures or vomiting reported.   Objective: Vitals:   10/30/23 0534 10/30/23 0841 10/30/23 1415 10/30/23 2211  BP: 121/68 114/82 123/75 (!) 130/91  Pulse: 95 (!) 104 64 84  Resp: 16  16 16   Temp: 98.6 F (37 C)  97.6 F (36.4 C) 98.4 F (36.9 C)  TempSrc:    Oral  SpO2: 97%  100% 99%  Weight:      Height:        Intake/Output Summary (Last 24 hours) at 10/31/2023 0748 Last data filed at 10/31/2023 0600 Gross per 24 hour  Intake 1146.9 ml  Output 3000 ml  Net -1853.1 ml   Filed Weights   10/28/23 0500 10/29/23 0500 10/30/23 0500  Weight: 98.2 kg 103.3 kg 101.7 kg    Examination:  General: No acute distress.  Remains on room air.  Chronically ill and deconditioned looking.   ENT/neck: No obvious neck masses or JVD elevation noted  respiratory: Bilateral decreased breath sounds at bases, no wheezing  CVS: S1 and S2 are heard; rate currently controlled  abdominal: Soft, obese, nontender, remains mildly distended; no organomegaly, bowel sounds are heard  extremities: No clubbing; mild lower extremity edema present  CNS: Awake; remains slow to respond.  Very poor historian.  No focal deficits noted Lymph: No lymphadenopathy. Skin: No obvious rash/petechiae  psych: Not agitated currently.  Flat affect mostly musculoskeletal: Right ankle mild tenderness present    Data Reviewed: I have personally reviewed following labs and  imaging studies  CBC: Recent Labs  Lab 10/25/23 2241 10/26/23 1725 10/27/23 0338 10/28/23 0332 10/31/23 0319  WBC 6.8 6.6 6.2 7.0 5.3  NEUTROABS 3.1 2.7  --   --  2.0  HGB 12.2* 11.9* 11.4* 11.7* 13.7  HCT 38.5* 37.5* 35.8* 37.3* 44.1  MCV 97.5 97.7 98.4 98.7 97.6  PLT 266 253 229 250  322   Basic Metabolic Panel: Recent Labs  Lab 10/27/23 0338 10/28/23 0332 10/29/23 0422 10/30/23 0356 10/31/23 0319  NA 137 135 137 136 136  K 4.0 4.2 4.3 4.3 4.3  CL 108 107 109 108 105  CO2 20* 21* 22 20* 22  GLUCOSE 136* 116* 111* 110* 143*  BUN 21 25* 26* 28* 29*  CREATININE 1.27* 1.47* 1.61* 1.60* 1.97*  CALCIUM  8.9 8.5* 9.2 9.3 9.7  MG  --  2.2 2.0 2.1 2.2   GFR: Estimated Creatinine Clearance: 44.8 mL/min (A) (by C-G formula based on SCr of 1.97 mg/dL (H)). Liver Function Tests: Recent Labs  Lab 10/26/23 1725  AST 18  ALT 10  ALKPHOS 70  BILITOT 0.9  PROT 7.6  ALBUMIN 3.5   No results for input(s): "LIPASE", "AMYLASE" in the last 168 hours. No results for input(s): "AMMONIA" in the last 168 hours. Coagulation Profile: No results for input(s): "INR", "PROTIME" in the last 168 hours. Cardiac Enzymes: No results for input(s): "CKTOTAL", "CKMB", "CKMBINDEX", "TROPONINI" in the last 168 hours. BNP (last 3 results) No results for input(s): "PROBNP" in the last 8760 hours. HbA1C: No results for input(s): "HGBA1C" in the last 72 hours. CBG: Recent Labs  Lab 10/29/23 2247 10/30/23 0801 10/30/23 1157 10/30/23 1702 10/30/23 2206  GLUCAP 143* 115* 138* 166* 90   Lipid Profile: No results for input(s): "CHOL", "HDL", "LDLCALC", "TRIG", "CHOLHDL", "LDLDIRECT" in the last 72 hours. Thyroid  Function Tests: No results for input(s): "TSH", "T4TOTAL", "FREET4", "T3FREE", "THYROIDAB" in the last 72 hours. Anemia Panel: No results for input(s): "VITAMINB12", "FOLATE", "FERRITIN", "TIBC", "IRON", "RETICCTPCT" in the last 72 hours. Sepsis Labs: Recent Labs  Lab 10/26/23 1725  LATICACIDVEN 1.3    Recent Results (from the past 240 hours)  Resp panel by RT-PCR (RSV, Flu A&B, Covid) Anterior Nasal Swab     Status: None   Collection Time: 10/25/23 10:42 PM   Specimen: Anterior Nasal Swab  Result Value Ref Range Status   SARS Coronavirus 2 by RT PCR NEGATIVE NEGATIVE  Final    Comment: (NOTE) SARS-CoV-2 target nucleic acids are NOT DETECTED.  The SARS-CoV-2 RNA is generally detectable in upper respiratory specimens during the acute phase of infection. The lowest concentration of SARS-CoV-2 viral copies this assay can detect is 138 copies/mL. A negative result does not preclude SARS-Cov-2 infection and should not be used as the sole basis for treatment or other patient management decisions. A negative result may occur with  improper specimen collection/handling, submission of specimen other than nasopharyngeal swab, presence of viral mutation(s) within the areas targeted by this assay, and inadequate number of viral copies(<138 copies/mL). A negative result must be combined with clinical observations, patient history, and epidemiological information. The expected result is Negative.  Fact Sheet for Patients:  BloggerCourse.com  Fact Sheet for Healthcare Providers:  SeriousBroker.it  This test is no t yet approved or cleared by the [place]  FDA and  has been authorized for detection and/or diagnosis of SARS-CoV-2 by FDA under an Emergency Use Authorization (EUA). This EUA will remain  in effect (meaning this test can be used) for  the duration of the COVID-19 declaration under Section 564(b)(1) of the Act, 21 U.S.C.section 360bbb-3(b)(1), unless the authorization is terminated  or revoked sooner.       Influenza A by PCR NEGATIVE NEGATIVE Final   Influenza B by PCR NEGATIVE NEGATIVE Final    Comment: (NOTE) The Xpert Xpress SARS-CoV-2/FLU/RSV plus assay is intended as an aid in the diagnosis of influenza from Nasopharyngeal swab specimens and should not be used as a sole basis for treatment. Nasal washings and aspirates are unacceptable for Xpert Xpress SARS-CoV-2/FLU/RSV testing.  Fact Sheet for Patients: BloggerCourse.com  Fact Sheet for Healthcare  Providers: SeriousBroker.it  This test is not yet approved or cleared by the [place]  FDA and has been authorized for detection and/or diagnosis of SARS-CoV-2 by FDA under an Emergency Use Authorization (EUA). This EUA will remain in effect (meaning this test can be used) for the duration of the COVID-19 declaration under Section 564(b)(1) of the Act, 21 U.S.C. section 360bbb-3(b)(1), unless the authorization is terminated or revoked.     Resp Syncytial Virus by PCR NEGATIVE NEGATIVE Final    Comment: (NOTE) Fact Sheet for Patients: BloggerCourse.com  Fact Sheet for Healthcare Providers: SeriousBroker.it  This test is not yet approved or cleared by the [place]  FDA and has been authorized for detection and/or diagnosis of SARS-CoV-2 by FDA under an Emergency Use Authorization (EUA). This EUA will remain in effect (meaning this test can be used) for the duration of the COVID-19 declaration under Section 564(b)(1) of the Act, 21 U.S.C. section 360bbb-3(b)(1), unless the authorization is terminated or revoked.  Performed at [facility], [address], [place], [place] [address]   Blood culture (routine x 2)     Status: None   Collection Time: 10/26/23  5:25 PM   Specimen: BLOOD  Result Value Ref Range Status   Specimen Description   Final    BLOOD RIGHT ANTECUBITAL Performed at [facility], [address] [place], [place] [address]    Special Requests   Final    BOTTLES DRAWN AEROBIC AND ANAEROBIC Blood Culture adequate volume Performed at [facility], [address] [place], [place] [address]    Culture   Final    NO GROWTH 5 DAYS Performed at [facility], [address] [place], [place] [address]    Report Status 10/31/2023 FINAL  Final  Blood culture (routine x 2)     Status: None   Collection Time: 10/26/23  5:30  PM   Specimen: BLOOD RIGHT FOREARM  Result Value Ref Range Status   Specimen Description   Final    BLOOD RIGHT FOREARM Performed at [facility], [address] [place], [place] [address]    Special Requests   Final    BOTTLES DRAWN AEROBIC AND ANAEROBIC Blood Culture results may not be optimal due to an inadequate volume of blood received in culture bottles Performed at [facility], [address], [place], [place] [address]    Culture   Final    NO GROWTH 5 DAYS Performed at [facility], [address], [place], [place] [address]    Report Status 10/31/2023 FINAL  Final  Urine Culture     Status: Abnormal   Collection Time: 10/26/23  6:50 PM   Specimen: Urine, Random  Result Value Ref Range Status   Specimen Description   Final    URINE, RANDOM Performed at [facility],  [address], [place], [place] [address]    Special Requests   Final    NONE Reflexed from [id number] Performed at [facility], [address], [place], [place] [address]    Culture (A)  Final    >=100,000 COLONIES/mL ESCHERICHIA COLI Confirmed Extended Spectrum Beta-Lactamase Producer (ESBL).  In bloodstream infections from ESBL organisms, carbapenems are preferred over piperacillin /tazobactam. They are shown to have a lower risk of mortality.    Report Status 10/28/2023 FINAL  Final   Organism ID, Bacteria ESCHERICHIA COLI (A)  Final      Susceptibility   Escherichia coli - MIC*    AMPICILLIN >=32 RESISTANT Resistant     CEFAZOLIN  >=64 RESISTANT Resistant     CEFEPIME  16 RESISTANT Resistant     CEFTRIAXONE >=64 RESISTANT Resistant     CIPROFLOXACIN  >=4 RESISTANT Resistant     GENTAMICIN <=1 SENSITIVE Sensitive     IMIPENEM <=0.25 SENSITIVE Sensitive     NITROFURANTOIN 128 RESISTANT Resistant     TRIMETH /SULFA  <=20 SENSITIVE Sensitive     AMPICILLIN/SULBACTAM >=32 RESISTANT Resistant     PIP/TAZO 8 SENSITIVE Sensitive ug/mL    *  >=100,000 COLONIES/mL ESCHERICHIA COLI         Radiology Studies: VAS US  ABI WITH/WO TBI Result Date: 10/30/2023  LOWER EXTREMITY DOPPLER STUDY Patient Name:  Jeremy [name]  Date of Exam:   10/30/2023 Medical Rec #: [id number]        Accession #:    [id number] Date of Birth: April 03, 1956       Patient Gender: M Patient Age:   49 years Exam Location:  [facility] Procedure:      VAS US  ABI WITH/WO TBI Referring Phys: --------------------------------------------------------------------------------  Indications: Peripheral artery disease. High Risk Factors: Hypertension, hyperlipidemia, Diabetes, current smoker, prior                    MI, coronary artery disease. Other Factors: CKD, CHF, Afib, Hx CABG, HX of polysubstance abuse.  Comparison Study: No previous exams Performing Technologist: Hill, Jody RVT, RDMS  Examination Guidelines: A complete evaluation includes at minimum, Doppler waveform signals and systolic blood pressure reading at the level of bilateral brachial, anterior tibial, and posterior tibial arteries, when vessel segments are accessible. Bilateral testing is considered an integral part of a complete examination. Photoelectric Plethysmograph (PPG) waveforms and toe systolic pressure readings are included as required and additional duplex testing as needed. Limited examinations for reoccurring indications may be performed as noted.  ABI Findings: +---------+------------------+-----+----------+--------+ Right    Rt Pressure (mmHg)IndexWaveform  Comment  +---------+------------------+-----+----------+--------+ Brachial 126                    triphasic          +---------+------------------+-----+----------+--------+ PTA      148               1.13 triphasic          +---------+------------------+-----+----------+--------+ DP       96                0.73 monophasic         +---------+------------------+-----+----------+--------+ Great Toe69                0.53  Normal             +---------+------------------+-----+----------+--------+ +---------+------------------+-----+---------+-------+ Left     Lt Pressure (mmHg)IndexWaveform Comment +---------+------------------+-----+---------+-------+ Brachial 131  triphasic        +---------+------------------+-----+---------+-------+ PTA      151               1.15 triphasic        +---------+------------------+-----+---------+-------+ DP       115               0.88 biphasic         +---------+------------------+-----+---------+-------+ Great Toe60                0.46 Normal           +---------+------------------+-----+---------+-------+ +-------+-----------+-----------+------------+------------+ ABI/TBIToday's ABIToday's TBIPrevious ABIPrevious TBI +-------+-----------+-----------+------------+------------+ Right  1.13       0.53                                +-------+-----------+-----------+------------+------------+ Left   1.15       0.46                                +-------+-----------+-----------+------------+------------+   Summary: Right: Resting right ankle-brachial index is within normal range. The right toe-brachial index is abnormal. Left: Resting left ankle-brachial index is within normal range. The left toe-brachial index is abnormal. *See table(s) above for measurements and observations.     Preliminary          Scheduled Meds:  amiodarone   200 mg Oral Daily   aspirin   81 mg Oral Daily   atorvastatin   40 mg Oral Daily   carvedilol   3.125 mg Oral BID   insulin  aspart  0-5 Units Subcutaneous QHS   insulin  aspart  0-6 Units Subcutaneous TID WC   rivaroxaban   20 mg Oral Daily   senna-docusate  1 tablet Oral BID   Continuous Infusions:  piperacillin -tazobactam (ZOSYN )  IV 3.375 g (10/31/23 0458)          Audria Leather, MD [facility] 10/31/2023, 7:48 AM

## 2023-10-31 NOTE — Plan of Care (Signed)
   Problem: Activity: Goal: Risk for activity intolerance will decrease Outcome: Progressing   Problem: Nutrition: Goal: Adequate nutrition will be maintained Outcome: Progressing   Problem: Pain Managment: Goal: General experience of comfort will improve and/or be controlled Outcome: Progressing

## 2023-10-31 NOTE — TOC Progression Note (Addendum)
 Transition of Care [facility]) - Progression Note    Patient Details  Name: Jeremy [name] MRN: [id number] Date of Birth: 1956/01/19  [facility] CM/SW Contact  Bari Leys, RN Phone Number: 10/31/2023, 10:27 AM  Clinical Narrative:  Met with patient at bedside to review short term rehab/SNF bed offers, NCM reviewed each facility with patient, location, Medicare Star Ratings, patient accepted bed offer at [facility].   -10:45am Call from Kia, admit coordinator, [facility], rescinded bed offer due to patient Homelessness.   10:58am Patient notified of [facility] rescinded bed offer, agreeable to Stana Ear, admit coord, notified. SNF auth initiated via [place]. Auth ID [id number], Siegfried Dress pending.     Expected Discharge Plan: Skilled Nursing Facility    Expected Discharge Plan and Services       Living arrangements for the past 2 months: Homeless                                       Social Determinants of Health (SDOH) Interventions SDOH Screenings   Food Insecurity: No Food Insecurity (10/27/2023)  Housing: Low Risk  (10/27/2023)  Transportation Needs: No Transportation Needs (10/27/2023)  Utilities: Not At Risk (10/27/2023)  Depression (PHQ2-9): Low Risk  (10/30/2018)  Financial Resource Strain: Low Risk  (06/03/2023)   Received from [facility]  Recent Concern: Financial Resource Strain - High Risk (03/22/2023)   Received from [facility]  Social Connections: Patient Declined (10/27/2023)  Tobacco Use: High Risk (10/26/2023)    Readmission Risk Interventions    10/28/2023   10:12 AM 08/09/2023    2:42 PM  Readmission Risk Prevention Plan  Transportation Screening Complete Complete  Medication Review (RN Care Manager) Complete   PCP or Specialist appointment within 3-5 days of discharge Complete   HRI or Home Care Consult Complete --  SW Recovery Care/Counseling Consult Complete    Palliative Care Screening Not Applicable Not Applicable  Skilled Nursing Facility Complete Not Applicable

## 2023-10-31 NOTE — Progress Notes (Signed)
 Physical Therapy Treatment Patient Details Name: Jeremy [name] MRN: [id number] DOB: 26-Jun-1955 Today's Date: 10/31/2023   History of Present Illness Jeremy [name] is a 68 y.o. male presents with fatigue, general malaise, and chronic right ankle pain. PMH: coronary artery disease s/p CABG, chronic HFrEF, type 2 diabetes mellitus, HTN, CKD 3A, and afib    PT Comments  Pt asleep upon arrival, agreeable to therapy with encouragement. Pt declines CAM walker boot, reports makes pain worse and therapist unable to educate pt to use device. Pt powers to stand with CGA and RW, using rocking momentum, then able to take 2-3 steps over to recliner with each foot, appears to tolerate minimal weight on R foot during transfer. Pt declines exercises or amb due to pain; in recliner with all needs in reach.   If plan is discharge home, recommend the following: A little help with walking and/or transfers;A little help with bathing/dressing/bathroom;Assistance with cooking/housework;Assist for transportation   Can travel by private vehicle     Yes  Equipment Recommendations  Rolling walker (2 wheels)    Recommendations for Other Services       Precautions / Restrictions Precautions Precautions: Fall Restrictions Weight Bearing Restrictions Per Provider Order: No     Mobility  Bed Mobility Overal bed mobility: Independent                  Transfers Overall transfer level: Needs assistance Equipment used: Rolling walker (2 wheels) Transfers: Sit to/from Stand, Bed to chair/wheelchair/BSC Sit to Stand: Contact guard assist   Step pivot transfers: Contact guard assist       General transfer comment: CGA to power up with rocking momentum, takes 2-3 steps over to recliner, increased UE weight on RW, appears to accept minimal weight onto R foot but able to maintain on floor this session, declines CAM walker boot    Ambulation/Gait                   Stairs              Wheelchair Mobility     Tilt Bed    Modified Rankin (Stroke Patients Only)       Balance Overall balance assessment: Needs assistance   Sitting balance-Leahy Scale: Good     Standing balance support: Reliant on assistive device for balance, During functional activity, Bilateral upper extremity supported Standing balance-Leahy Scale: Poor                              Communication Communication Communication: No apparent difficulties  Cognition Arousal: Alert Behavior During Therapy: WFL for tasks assessed/performed   PT - Cognitive impairments: No family/caregiver present to determine baseline                       PT - Cognition Comments: follows 1 step commands with increased time, encouragement to participate, singing intermittently Following commands: Intact      Cueing    Exercises      General Comments        Pertinent Vitals/Pain Pain Assessment Pain Assessment: Faces Faces Pain Scale: Hurts even more Pain Location: R foot Pain Descriptors / Indicators: Guarding, Discomfort Pain Intervention(s): Limited activity within patient's tolerance, Monitored during session, Repositioned    Home Living                          Prior Function  PT Goals (current goals can now be found in the care plan section) Acute Rehab PT Goals Patient Stated Goal: "I need new feet" PT Goal Formulation: With patient Time For Goal Achievement: 11/11/23 Potential to Achieve Goals: Fair Progress towards PT goals: Progressing toward goals    Frequency    Min 2X/week      PT Plan      Co-evaluation              AM-PAC PT "6 Clicks" Mobility   Outcome Measure  Help needed turning from your back to your side while in a flat bed without using bedrails?: None Help needed moving from lying on your back to sitting on the side of a flat bed without using bedrails?: None Help needed moving to and from a bed to a  chair (including a wheelchair)?: A Little Help needed standing up from a chair using your arms (e.g., wheelchair or bedside chair)?: A Little Help needed to walk in hospital room?: A Lot Help needed climbing 3-5 steps with a railing? : Total 6 Click Score: 17    End of Session Equipment Utilized During Treatment: Gait belt Activity Tolerance: Patient tolerated treatment well;Patient limited by pain Patient left: in chair;with call bell/phone within reach;with chair alarm set Nurse Communication: Mobility status PT Visit Diagnosis: Unsteadiness on feet (R26.81);Difficulty in walking, not elsewhere classified (R26.2);Pain Pain - Right/Left: Right Pain - part of body: Ankle and joints of foot     Time: 1610-9604 PT Time Calculation (min) (ACUTE ONLY): 14 min  Charges:    $Therapeutic Activity: 8-22 mins PT General Charges $$ ACUTE PT VISIT: 1 Visit                     Tori Jayce Boyko PT, DPT 10/31/23, 10:55 AM

## 2023-10-31 NOTE — Plan of Care (Signed)

## 2023-11-01 DIAGNOSIS — N3090 Cystitis, unspecified without hematuria: Secondary | ICD-10-CM | POA: Diagnosis not present

## 2023-11-01 LAB — BASIC METABOLIC PANEL WITH GFR
Anion gap: 10 (ref 5–15)
BUN: 30 mg/dL — ABNORMAL HIGH (ref 8–23)
CO2: 20 mmol/L — ABNORMAL LOW (ref 22–32)
Calcium: 9.6 mg/dL (ref 8.9–10.3)
Chloride: 108 mmol/L (ref 98–111)
Creatinine, Ser: 1.84 mg/dL — ABNORMAL HIGH (ref 0.61–1.24)
GFR, Estimated: 40 mL/min — ABNORMAL LOW (ref >60)
Glucose, Bld: 104 mg/dL — ABNORMAL HIGH (ref 70–99)
Potassium: 4.5 mmol/L (ref 3.5–5.1)
Sodium: 138 mmol/L (ref 135–145)

## 2023-11-01 LAB — GLUCOSE, CAPILLARY
Glucose-Capillary: 100 mg/dL — ABNORMAL HIGH (ref 70–99)
Glucose-Capillary: 101 mg/dL — ABNORMAL HIGH (ref 70–99)
Glucose-Capillary: 96 mg/dL (ref 70–99)
Glucose-Capillary: 122 mg/dL — ABNORMAL HIGH (ref 70–99)

## 2023-11-01 LAB — MAGNESIUM: Magnesium: 2.2 mg/dL (ref 1.7–2.4)

## 2023-11-01 NOTE — Progress Notes (Signed)
 Mobility Specialist - Progress Note   11/01/23 0922  Mobility  Activity Transferred from bed to chair  Level of Assistance Contact guard assist, steadying assist  Assistive Device Front wheel walker  Distance Ambulated (ft) 2 ft  Activity Response Tolerated well  Mobility Referral Yes  Mobility visit 1 Mobility  Mobility Specialist Start Time (ACUTE ONLY) 0909  Mobility Specialist Stop Time (ACUTE ONLY) 0920  Mobility Specialist Time Calculation (min) (ACUTE ONLY) 11 min   Pt received in bed and agreeable to mobility. Halted hallway ambulation, d/t patient declining use of boot. No complaints during session. Pt to recliner after session with all needs met. Chair alarm on.   [facility]

## 2023-11-01 NOTE — Progress Notes (Signed)
 PROGRESS NOTE    Jeremy [name]  UJW:[id number] DOB: [birth year] DOA: 10/26/2023 PCP: Antonio Baumgarten, MD   Brief Narrative:  68 y.o. male with medical history significant for coronary artery disease status post CABG, chronic HFrEF, type 2 diabetes mellitus, hypertension, CKD 3A, and atrial fibrillation on Xarelto  presented with fatigue, malaise and chronic right ankle pain.  In the ED, workup showed potassium of 5.5, creatinine of 1.53 and UA suggestive of UTI.  He was started on broad-spectrum antibiotics.  Subsequently, urine culture grew E. coli.  PT recommended SNF placement.  TOC consulted.  Assessment & Plan:   Acute E coli UTI: Present on admission -continue IV Zosyn . Finish 7 day course  Hyperkalemia - Questionable cause.  Resolved  CAD Hypertension Hyperlipidemia -Stable.  Continue aspirin , statin, Coreg   Paroxysmal A-fib - Currently rate controlled.  Continue amiodarone , Coreg  and Xarelto   Chronic systolic heart failure - Currently compensated.  Continue Coreg .  Outpatient follow-up with cardiology.  Strict input output.  Daily weights.  Fluid restriction  Right ankle pain -has had chronic right ankle pain. Podiatry recommended conservative management and signed out.  Outpatient follow-up with podiatry  Diabetes mellitus type 2 with hyperglycemia -Continue CBGs with SSI.  Carb modified diet  AKI CKD stage IIIa - Baseline creatinine of 1.3-1.6.  Creatinine 1.97 on 10/31/2023.  Questionable cause.  Creatinine 1.84 today.  Continue gentle hydration.  Repeat a.m. labs.  Acute metabolic acidosis - Mild.  Monitor.  Anemia of chronic disease - From chronic kidney disease.  Hemoglobin stable.  Monitor intermittently  Obesity class I - Outpatient follow-up  Homelessness - TOC following  Physical deconditioning - PT recommending SNF placement.  TOC following.  DVT prophylaxis: Xarelto  Code Status: Full Family Communication: None at bedside Disposition  Plan: Status is: inpatient because: Of severity of illness.  Need for IV antibiotics and fluids.  Need for SNF placement.    Consultants: Podiatry  Procedures: None  Antimicrobials: Zosyn  from 10/26/2023 onwards   Subjective: Patient seen and examined at bedside.  Poor historian.  No agitation, fever, vomiting reported.   Objective: Vitals:   10/31/23 1314 10/31/23 2006 11/01/23 0500 11/01/23 0523  BP: 100/67 106/80  132/82  Pulse: 89 84  86  Resp: 17 17  18   Temp: (!) 97.5 F (36.4 C) (!) 97.5 F (36.4 C)  98.4 F (36.9 C)  TempSrc:  Oral  Oral  SpO2: 99% 100%  99%  Weight:   100.2 kg   Height:        Intake/Output Summary (Last 24 hours) at 11/01/2023 0801 Last data filed at 11/01/2023 0600 Gross per 24 hour  Intake 2571.98 ml  Output 2200 ml  Net 371.98 ml   Filed Weights   10/29/23 0500 10/30/23 0500 11/01/23 0500  Weight: 103.3 kg 101.7 kg 100.2 kg    Examination:  General: On room air.  No distress.  Chronically ill and deconditioned looking.   ENT/neck: No obvious thyromegaly or elevated JVD noted respiratory: Decreased breath sounds at bases bilaterally with scattered crackles CVS: Rate mostly controlled; S1 and S2 are heard abdominal: Soft, obese, nontender, still has some mild distention; no organomegaly, bowel sounds are normally heard  extremities: Trace lower extremity edema present; no cyanosis CNS: Very slow to respond; alert.  Extremely poor historian.  No obvious focal deficits noted Lymph: No cervical lymphadenopathy. Skin: No obvious ecchymosis/lesions psych: Flat affect currently with no signs of agitation  musculoskeletal: Mild right ankle mild tenderness present    Data Reviewed:  I have personally reviewed following labs and imaging studies  CBC: Recent Labs  Lab 10/25/23 2241 10/26/23 1725 10/27/23 0338 10/28/23 0332 10/31/23 0319  WBC 6.8 6.6 6.2 7.0 5.3  NEUTROABS 3.1 2.7  --   --  2.0  HGB 12.2* 11.9* 11.4* 11.7* 13.7  HCT  38.5* 37.5* 35.8* 37.3* 44.1  MCV 97.5 97.7 98.4 98.7 97.6  PLT 266 253 229 250 322   Basic Metabolic Panel: Recent Labs  Lab 10/28/23 0332 10/29/23 0422 10/30/23 0356 10/31/23 0319 11/01/23 0332  NA 135 137 136 136 138  K 4.2 4.3 4.3 4.3 4.5  CL 107 109 108 105 108  CO2 21* 22 20* 22 20*  GLUCOSE 116* 111* 110* 143* 104*  BUN 25* 26* 28* 29* 30*  CREATININE 1.47* 1.61* 1.60* 1.97* 1.84*  CALCIUM  8.5* 9.2 9.3 9.7 9.6  MG 2.2 2.0 2.1 2.2 2.2   GFR: Estimated Creatinine Clearance: 47.6 mL/min (A) (by C-G formula based on SCr of 1.84 mg/dL (H)). Liver Function Tests: Recent Labs  Lab 10/26/23 1725  AST 18  ALT 10  ALKPHOS 70  BILITOT 0.9  PROT 7.6  ALBUMIN 3.5   No results for input(s): "LIPASE", "AMYLASE" in the last 168 hours. No results for input(s): "AMMONIA" in the last 168 hours. Coagulation Profile: No results for input(s): "INR", "PROTIME" in the last 168 hours. Cardiac Enzymes: No results for input(s): "CKTOTAL", "CKMB", "CKMBINDEX", "TROPONINI" in the last 168 hours. BNP (last 3 results) No results for input(s): "PROBNP" in the last 8760 hours. HbA1C: No results for input(s): "HGBA1C" in the last 72 hours. CBG: Recent Labs  Lab 10/31/23 1141 10/31/23 1649 10/31/23 2010 10/31/23 2109 11/01/23 0725  GLUCAP 115* 156* 333* 148* 100*   Lipid Profile: No results for input(s): "CHOL", "HDL", "LDLCALC", "TRIG", "CHOLHDL", "LDLDIRECT" in the last 72 hours. Thyroid  Function Tests: No results for input(s): "TSH", "T4TOTAL", "FREET4", "T3FREE", "THYROIDAB" in the last 72 hours. Anemia Panel: No results for input(s): "VITAMINB12", "FOLATE", "FERRITIN", "TIBC", "IRON", "RETICCTPCT" in the last 72 hours. Sepsis Labs: Recent Labs  Lab 10/26/23 1725  LATICACIDVEN 1.3    Recent Results (from the past 240 hours)  Resp panel by RT-PCR (RSV, Flu A&B, Covid) Anterior Nasal Swab     Status: None   Collection Time: 10/25/23 10:42 PM   Specimen: Anterior Nasal  Swab  Result Value Ref Range Status   SARS Coronavirus 2 by RT PCR NEGATIVE NEGATIVE Final    Comment: (NOTE) SARS-CoV-2 target nucleic acids are NOT DETECTED.  The SARS-CoV-2 RNA is generally detectable in upper respiratory specimens during the acute phase of infection. The lowest concentration of SARS-CoV-2 viral copies this assay can detect is 138 copies/mL. A negative result does not preclude SARS-Cov-2 infection and should not be used as the sole basis for treatment or other patient management decisions. A negative result may occur with  improper specimen collection/handling, submission of specimen other than nasopharyngeal swab, presence of viral mutation(s) within the areas targeted by this assay, and inadequate number of viral copies(<138 copies/mL). A negative result must be combined with clinical observations, patient history, and epidemiological information. The expected result is Negative.  Fact Sheet for Patients:  BloggerCourse.com  Fact Sheet for Healthcare Providers:  SeriousBroker.it  This test is no t yet approved or cleared by the [place]  FDA and  has been authorized for detection and/or diagnosis of SARS-CoV-2 by FDA under an Emergency Use Authorization (EUA). This EUA will remain  in effect (meaning this  test can be used) for the duration of the COVID-19 declaration under Section 564(b)(1) of the Act, 21 U.S.C.section 360bbb-3(b)(1), unless the authorization is terminated  or revoked sooner.       Influenza A by PCR NEGATIVE NEGATIVE Final   Influenza B by PCR NEGATIVE NEGATIVE Final    Comment: (NOTE) The Xpert Xpress SARS-CoV-2/FLU/RSV plus assay is intended as an aid in the diagnosis of influenza from Nasopharyngeal swab specimens and should not be used as a sole basis for treatment. Nasal washings and aspirates are unacceptable for Xpert Xpress SARS-CoV-2/FLU/RSV testing.  Fact Sheet for  Patients: BloggerCourse.com  Fact Sheet for Healthcare Providers: SeriousBroker.it  This test is not yet approved or cleared by the [place]  FDA and has been authorized for detection and/or diagnosis of SARS-CoV-2 by FDA under an Emergency Use Authorization (EUA). This EUA will remain in effect (meaning this test can be used) for the duration of the COVID-19 declaration under Section 564(b)(1) of the Act, 21 U.S.C. section 360bbb-3(b)(1), unless the authorization is terminated or revoked.     Resp Syncytial Virus by PCR NEGATIVE NEGATIVE Final    Comment: (NOTE) Fact Sheet for Patients: BloggerCourse.com  Fact Sheet for Healthcare Providers: SeriousBroker.it  This test is not yet approved or cleared by the [place]  FDA and has been authorized for detection and/or diagnosis of SARS-CoV-2 by FDA under an Emergency Use Authorization (EUA). This EUA will remain in effect (meaning this test can be used) for the duration of the COVID-19 declaration under Section 564(b)(1) of the Act, 21 U.S.C. section 360bbb-3(b)(1), unless the authorization is terminated or revoked.  Performed at [facility], [address], [place], [place] [address]   Blood culture (routine x 2)     Status: None   Collection Time: 10/26/23  5:25 PM   Specimen: BLOOD  Result Value Ref Range Status   Specimen Description   Final    BLOOD RIGHT ANTECUBITAL Performed at [facility], [address], [place], [place] [address]    Special Requests   Final    BOTTLES DRAWN AEROBIC AND ANAEROBIC Blood Culture adequate volume Performed at [facility], [address] [place], [place] [address]    Culture   Final    NO GROWTH 5 DAYS Performed at [facility], [address], [place], [place] [address]    Report Status 10/31/2023 FINAL  Final   Blood culture (routine x 2)     Status: None   Collection Time: 10/26/23  5:30 PM   Specimen: BLOOD RIGHT FOREARM  Result Value Ref Range Status   Specimen Description   Final    BLOOD RIGHT FOREARM Performed at [facility], [address], [place], [place] [address]    Special Requests   Final    BOTTLES DRAWN AEROBIC AND ANAEROBIC Blood Culture results may not be optimal due to an inadequate volume of blood received in culture bottles Performed at [facility], [address], [place], [place] [address]    Culture   Final    NO GROWTH 5 DAYS Performed at [facility], [address], [place], [place] [address]    Report Status 10/31/2023 FINAL  Final  Urine Culture     Status: Abnormal   Collection Time: 10/26/23  6:50 PM   Specimen: Urine, Random  Result Value Ref Range Status   Specimen Description   Final    URINE, RANDOM Performed  at [facility], [address], [place], [place] [address]    Special Requests   Final    NONE Reflexed from [id number] Performed at [facility], [address], [place], [place] [address]    Culture (A)  Final    >=100,000 COLONIES/mL ESCHERICHIA COLI Confirmed Extended Spectrum Beta-Lactamase Producer (ESBL).  In bloodstream infections from ESBL organisms, carbapenems are preferred over piperacillin /tazobactam. They are shown to have a lower risk of mortality.    Report Status 10/28/2023 FINAL  Final   Organism ID, Bacteria ESCHERICHIA COLI (A)  Final      Susceptibility   Escherichia coli - MIC*    AMPICILLIN >=32 RESISTANT Resistant     CEFAZOLIN  >=64 RESISTANT Resistant     CEFEPIME  16 RESISTANT Resistant     CEFTRIAXONE >=64 RESISTANT Resistant     CIPROFLOXACIN  >=4 RESISTANT Resistant     GENTAMICIN <=1 SENSITIVE Sensitive     IMIPENEM <=0.25 SENSITIVE Sensitive     NITROFURANTOIN 128 RESISTANT Resistant     TRIMETH /SULFA  <=20 SENSITIVE Sensitive      AMPICILLIN/SULBACTAM >=32 RESISTANT Resistant     PIP/TAZO 8 SENSITIVE Sensitive ug/mL    * >=100,000 COLONIES/mL ESCHERICHIA COLI         Radiology Studies: VAS US  ABI WITH/WO TBI Result Date: 10/31/2023  LOWER EXTREMITY DOPPLER STUDY Patient Name:  Luismiguel Lamere  Date of Exam:   10/30/2023 Medical Rec #: [id number]        Accession #:    [id number] Date of Birth: 08-03-1955       Patient Gender: M Patient Age:   35 years Exam Location:  [facility] Procedure:      VAS US  ABI WITH/WO TBI Referring Phys: --------------------------------------------------------------------------------  Indications: Peripheral artery disease. High Risk Factors: Hypertension, hyperlipidemia, Diabetes, current smoker, prior                    MI, coronary artery disease. Other Factors: CKD, CHF, Afib, Hx CABG, HX of polysubstance abuse.  Comparison Study: No previous exams Performing Technologist: Hill, Jody RVT, RDMS  Examination Guidelines: A complete evaluation includes at minimum, Doppler waveform signals and systolic blood pressure reading at the level of bilateral brachial, anterior tibial, and posterior tibial arteries, when vessel segments are accessible. Bilateral testing is considered an integral part of a complete examination. Photoelectric Plethysmograph (PPG) waveforms and toe systolic pressure readings are included as required and additional duplex testing as needed. Limited examinations for reoccurring indications may be performed as noted.  ABI Findings: +---------+------------------+-----+----------+--------+ Right    Rt Pressure (mmHg)IndexWaveform  Comment  +---------+------------------+-----+----------+--------+ Brachial 126                    triphasic          +---------+------------------+-----+----------+--------+ PTA      148               1.13 triphasic          +---------+------------------+-----+----------+--------+ DP       96                0.73 monophasic          +---------+------------------+-----+----------+--------+ Great Toe69                0.53 Normal             +---------+------------------+-----+----------+--------+ +---------+------------------+-----+---------+-------+ Left     Lt Pressure (mmHg)IndexWaveform Comment +---------+------------------+-----+---------+-------+ Brachial 131  triphasic        +---------+------------------+-----+---------+-------+ PTA      151               1.15 triphasic        +---------+------------------+-----+---------+-------+ DP       115               0.88 biphasic         +---------+------------------+-----+---------+-------+ Great Toe60                0.46 Normal           +---------+------------------+-----+---------+-------+ +-------+-----------+-----------+------------+------------+ ABI/TBIToday's ABIToday's TBIPrevious ABIPrevious TBI +-------+-----------+-----------+------------+------------+ Right  1.13       0.53                                +-------+-----------+-----------+------------+------------+ Left   1.15       0.46                                +-------+-----------+-----------+------------+------------+  Summary: Right: Resting right ankle-brachial index is within normal range. The right toe-brachial index is abnormal. Left: Resting left ankle-brachial index is within normal range. The left toe-brachial index is abnormal. *See table(s) above for measurements and observations.  Electronically signed by Runell Countryman on 10/31/2023 at 3:40:49 PM.    Final          Scheduled Meds:  amiodarone   200 mg Oral Daily   aspirin   81 mg Oral Daily   atorvastatin   40 mg Oral Daily   carvedilol   3.125 mg Oral BID   insulin  aspart  0-5 Units Subcutaneous QHS   insulin  aspart  0-6 Units Subcutaneous TID WC   rivaroxaban   20 mg Oral Daily   senna-docusate  1 tablet Oral BID   Continuous Infusions:  sodium chloride  75 mL/hr at 10/31/23 2106    piperacillin -tazobactam (ZOSYN )  IV 3.375 g (11/01/23 0500)          Audria Leather, MD [facility] 11/01/2023, 8:01 AM

## 2023-11-02 DIAGNOSIS — N179 Acute kidney failure, unspecified: Secondary | ICD-10-CM | POA: Diagnosis not present

## 2023-11-02 DIAGNOSIS — I2510 Atherosclerotic heart disease of native coronary artery without angina pectoris: Secondary | ICD-10-CM | POA: Diagnosis not present

## 2023-11-02 DIAGNOSIS — M14671 Charcot's joint, right ankle and foot: Secondary | ICD-10-CM

## 2023-11-02 DIAGNOSIS — E119 Type 2 diabetes mellitus without complications: Secondary | ICD-10-CM

## 2023-11-02 DIAGNOSIS — Z1612 Extended spectrum beta lactamase (ESBL) resistance: Secondary | ICD-10-CM

## 2023-11-02 DIAGNOSIS — E785 Hyperlipidemia, unspecified: Secondary | ICD-10-CM

## 2023-11-02 DIAGNOSIS — N390 Urinary tract infection, site not specified: Principal | ICD-10-CM

## 2023-11-02 DIAGNOSIS — I10 Essential (primary) hypertension: Secondary | ICD-10-CM

## 2023-11-02 DIAGNOSIS — B9629 Other Escherichia coli [E. coli] as the cause of diseases classified elsewhere: Secondary | ICD-10-CM

## 2023-11-02 LAB — CBC WITH DIFFERENTIAL/PLATELET
Abs Immature Granulocytes: 0.01 10*3/uL (ref 0.00–0.07)
Basophils Absolute: 0.1 10*3/uL (ref 0.0–0.1)
Basophils Relative: 2 %
Eosinophils Absolute: 0.4 10*3/uL (ref 0.0–0.5)
Eosinophils Relative: 6 %
HCT: 40.4 % (ref 39.0–52.0)
Hemoglobin: 13 g/dL (ref 13.0–17.0)
Immature Granulocytes: 0 %
Lymphocytes Relative: 46 %
Lymphs Abs: 2.7 10*3/uL (ref 0.7–4.0)
MCH: 30.7 pg (ref 26.0–34.0)
MCHC: 32.2 g/dL (ref 30.0–36.0)
MCV: 95.3 fL (ref 80.0–100.0)
Monocytes Absolute: 0.7 10*3/uL (ref 0.1–1.0)
Monocytes Relative: 11 %
Neutro Abs: 2 10*3/uL (ref 1.7–7.7)
Neutrophils Relative %: 35 %
Platelets: 347 10*3/uL (ref 150–400)
RBC: 4.24 MIL/uL (ref 4.22–5.81)
RDW: 13.8 % (ref 11.5–15.5)
WBC: 5.8 10*3/uL (ref 4.0–10.5)
nRBC: 0 % (ref 0.0–0.2)

## 2023-11-02 LAB — BASIC METABOLIC PANEL WITH GFR
Anion gap: 8 (ref 5–15)
BUN: 29 mg/dL — ABNORMAL HIGH (ref 8–23)
CO2: 20 mmol/L — ABNORMAL LOW (ref 22–32)
Calcium: 9.5 mg/dL (ref 8.9–10.3)
Chloride: 107 mmol/L (ref 98–111)
Creatinine, Ser: 1.84 mg/dL — ABNORMAL HIGH (ref 0.61–1.24)
GFR, Estimated: 40 mL/min — ABNORMAL LOW (ref >60)
Glucose, Bld: 121 mg/dL — ABNORMAL HIGH (ref 70–99)
Potassium: 4.1 mmol/L (ref 3.5–5.1)
Sodium: 135 mmol/L (ref 135–145)

## 2023-11-02 LAB — GLUCOSE, CAPILLARY
Glucose-Capillary: 110 mg/dL — ABNORMAL HIGH (ref 70–99)
Glucose-Capillary: 134 mg/dL — ABNORMAL HIGH (ref 70–99)
Glucose-Capillary: 121 mg/dL — ABNORMAL HIGH (ref 70–99)
Glucose-Capillary: 134 mg/dL — ABNORMAL HIGH (ref 70–99)

## 2023-11-02 LAB — MAGNESIUM: Magnesium: 2.2 mg/dL (ref 1.7–2.4)

## 2023-11-02 MED ORDER — PIPERACILLIN-TAZOBACTAM 3.375 G IVPB 30 MIN
3.3750 g | Freq: Three times a day (TID) | INTRAVENOUS | Status: AC
  Administered 2023-11-02 (×2): 3.375 g via INTRAVENOUS
  Filled 2023-11-02 (×2): qty 50

## 2023-11-02 NOTE — TOC Progression Note (Addendum)
 Transition of Care [facility]) - Progression Note    Patient Details  Name: Jeremy [name] MRN: [id number] Date of Birth: Aug 24, 1955  [facility] CM/SW Contact  Bari Leys, RN Phone Number: 11/02/2023, 1:56 PM  Clinical Narrative:   Per [facility], SNF auth approved, Plan Auth ID [id number]  Auth ID [id number], days approved 11/02/2023-11/04/2023, team notified.   -2:08pm Sallyanne Creamer, admit coordinator at [facility] notified, confirmed bed available tomorrow, team notified.    Expected Discharge Plan: Skilled Nursing Facility    Expected Discharge Plan and Services       Living arrangements for the past 2 months: Homeless                                       Social Determinants of Health (SDOH) Interventions SDOH Screenings   Food Insecurity: No Food Insecurity (10/27/2023)  Housing: Low Risk  (10/27/2023)  Transportation Needs: No Transportation Needs (10/27/2023)  Utilities: Not At Risk (10/27/2023)  Depression (PHQ2-9): Low Risk  (10/30/2018)  Financial Resource Strain: Low Risk  (06/03/2023)   [facility] from [facility]  Recent Concern: Financial Resource Strain - High Risk (03/22/2023)   Received from [facility]  Social Connections: Patient Declined (10/27/2023)  Tobacco Use: High Risk (10/26/2023)    Readmission Risk Interventions    10/28/2023   10:12 AM 08/09/2023    2:42 PM  Readmission Risk Prevention Plan  Transportation Screening Complete Complete  Medication Review (RN Care Manager) Complete   PCP or Specialist appointment within 3-5 days of discharge Complete   HRI or Home Care Consult Complete --  SW Recovery Care/Counseling Consult Complete   Palliative Care Screening Not Applicable Not Applicable  Skilled Nursing Facility Complete Not Applicable

## 2023-11-02 NOTE — Plan of Care (Signed)
   Problem: Activity: Goal: Risk for activity intolerance will decrease Outcome: Progressing   Problem: Nutrition: Goal: Adequate nutrition will be maintained Outcome: Progressing   Problem: Coping: Goal: Level of anxiety will decrease Outcome: Progressing

## 2023-11-02 NOTE — Progress Notes (Signed)
 PROGRESS NOTE    Jeremy [name]  XLK:[id number] DOB: 06-01-1956 DOA: 10/26/2023 PCP: Antonio Baumgarten, MD    Chief Complaint  Patient presents with   Ankle Pain    Brief Narrative:  68 y.o. male with medical history significant for coronary artery disease status post CABG, chronic HFrEF, type 2 diabetes mellitus, hypertension, CKD 3A, and atrial fibrillation on Xarelto  presented with fatigue, malaise and chronic right ankle pain.  In the ED, workup showed potassium of 5.5, creatinine of 1.53 and UA suggestive of UTI.  He was started on broad-spectrum antibiotics.  Subsequently, urine culture grew E. coli.  PT recommended SNF placement.  TOC consulted.    Assessment & Plan:   Principal Problem:   UTI due to extended-spectrum beta lactamase (ESBL) producing Escherichia coli Active Problems:   AKI (acute kidney injury) (HCC)   Essential hypertension   Diabetes mellitus, type 2 (HCC)   Stage 3 chronic kidney disease (HCC)   HFrEF (heart failure with reduced ejection fraction) (HCC)   PAF (paroxysmal atrial fibrillation) (HCC)   Dyslipidemia   Paroxysmal atrial fibrillation (HCC)   Coronary artery disease   Type 2 diabetes mellitus with chronic kidney disease, with long-term current use of insulin  (HCC)   Cystitis   Charcot joint of right ankle   #1 ESBL E. coli UTI -Clinical improvement. -Continue IV Zosyn  to complete a 7-day course of treatment.  2.  Hyperkalemia -Resolved.  3.  CAD/hypertension/hyperlipidemia -Stable. - Continue aspirin , statin, Coreg .  4.  Paroxysmal atrial fibrillation -Currently rate controlled on Coreg  and amiodarone . - Xarelto  for anticoagulation.  5.  Chronic systolic heart failure -Euvolemic on examination. - Continue Coreg , statin. - Outpatient follow-up with cardiology.  6.  Right ankle pain - Patient with history of chronic right ankle pain - ABIs obtained with abnormal TBI's. - Patient underwent CT of the right foot which were  reviewed by podiatry and showed no acute changes compared to prior imaging. - No signs of clinical or radiographic infection per podiatry. - Podiatry recommending conservative treatment at this time. - Podiatry was following but signed off on 10/29/2023. - Outpatient follow-up with podiatry.  7.  Diabetes mellitus type 2 -Hemoglobin A1c 6.4 (06/26/2023) - Glucose levels on labs this morning at 121. - SSI.  8.  AKI on CKD stage IIIa -Baseline creatinine approximately 1.3-1.6. - Creatinine noted at 1.97 on 10/31/2023. - Slowly improving with gentle hydration creatinine seems to be plateauing currently at 1.84 today. - Continue IV fluids. - Repeat labs in the AM.  9.  Anemia of chronic disease -H&H stable.  10.  Physical deconditioning -Patient seen by PT who are recommending SNF placement.  11.  Homelessness -TOC following.  12.  Obesity class I -Lifestyle modification. - Outpatient follow-up with PCP.    DVT prophylaxis: Xarelto  Code Status: Full Family Communication: Updated patient.  No family at bedside. Disposition: SNF when bed available hopefully in the next 24 hours.  Status is: Inpatient Remains inpatient appropriate because: Unsafe disposition   Consultants:  Podiatry: Dr. Rosemarie Conquest 10/28/2023  Procedures:  CT right ankle 10/28/2023 Chest x-ray 10/25/2023 ABIs with TBI 10/30/2023  Antimicrobials:  Anti-infectives (From admission, onward)    Start     Dose/Rate Route Frequency Ordered Stop   11/02/23 1000  piperacillin -tazobactam (ZOSYN ) IVPB 3.375 g        3.375 g 12.5 mL/hr over 240 Minutes Intravenous Every 8 hours 11/02/23 0911 11/03/23 0159   10/27/23 0400  piperacillin -tazobactam (ZOSYN ) IVPB 3.375 g  3.375 g 12.5 mL/hr over 240 Minutes Intravenous Every 8 hours 10/26/23 2255 11/01/23 2359   10/26/23 1930  cefTRIAXone (ROCEPHIN) 1 g in sodium chloride  0.9 % 100 mL IVPB  Status:  Discontinued        1 g 200 mL/hr over 30 Minutes Intravenous   Once 10/26/23 1926 10/26/23 1927   10/26/23 1930  piperacillin -tazobactam (ZOSYN ) IVPB 3.375 g        3.375 g 100 mL/hr over 30 Minutes Intravenous  Once 10/26/23 1927 10/26/23 2214         Subjective: Patient laying in bed.  Denies any chest pain or shortness of breath.  No abdominal pain.  Tolerating current diet.  Good urine output.  Objective: Vitals:   11/01/23 1332 11/01/23 2121 11/02/23 0521 11/02/23 1436  BP: 121/63 (!) 130/91 115/63 121/60  Pulse: 68 80 78 80  Resp: 17 18 18 17   Temp: 97.8 F (36.6 C) 98.3 F (36.8 C) 97.8 F (36.6 C) 97.7 F (36.5 C)  TempSrc:  Oral Oral Oral  SpO2: 99% 98% 98% 100%  Weight:      Height:        Intake/Output Summary (Last 24 hours) at 11/02/2023 2018 Last data filed at 11/02/2023 1738 Gross per 24 hour  Intake 2583.18 ml  Output 2000 ml  Net 583.18 ml   Filed Weights   10/29/23 0500 10/30/23 0500 11/01/23 0500  Weight: 103.3 kg 101.7 kg 100.2 kg    Examination:  General exam: Appears calm and comfortable  Respiratory system: Clear to auscultation. Respiratory effort normal. Cardiovascular system: S1 & S2 heard, RRR. No JVD, murmurs, rubs, gallops or clicks. No pedal edema. Gastrointestinal system: Abdomen is nondistended, soft and nontender. No organomegaly or masses felt. Normal bowel sounds heard. Central nervous system: Alert and oriented. No focal neurological deficits. Extremities: Symmetric 5 x 5 power. Skin: No rashes, lesions or ulcers Psychiatry: Judgement and insight appear normal. Mood & affect appropriate.     Data Reviewed: I have personally reviewed following labs and imaging studies  CBC: Recent Labs  Lab 10/27/23 0338 10/28/23 0332 10/31/23 0319 11/02/23 0338  WBC 6.2 7.0 5.3 5.8  NEUTROABS  --   --  2.0 2.0  HGB 11.4* 11.7* 13.7 13.0  HCT 35.8* 37.3* 44.1 40.4  MCV 98.4 98.7 97.6 95.3  PLT 229 250 322 347    Basic Metabolic Panel: Recent Labs  Lab 10/29/23 0422 10/30/23 0356  10/31/23 0319 11/01/23 0332 11/02/23 0338  NA 137 136 136 138 135  K 4.3 4.3 4.3 4.5 4.1  CL 109 108 105 108 107  CO2 22 20* 22 20* 20*  GLUCOSE 111* 110* 143* 104* 121*  BUN 26* 28* 29* 30* 29*  CREATININE 1.61* 1.60* 1.97* 1.84* 1.84*  CALCIUM  9.2 9.3 9.7 9.6 9.5  MG 2.0 2.1 2.2 2.2 2.2    GFR: Estimated Creatinine Clearance: 47.6 mL/min (A) (by C-G formula based on SCr of 1.84 mg/dL (H)).  Liver Function Tests: No results for input(s): "AST", "ALT", "ALKPHOS", "BILITOT", "PROT", "ALBUMIN" in the last 168 hours.   CBG: Recent Labs  Lab 11/01/23 1624 11/01/23 2119 11/02/23 0716 11/02/23 1136 11/02/23 1640  GLUCAP 96 122* 110* 134* 121*     Recent Results (from the past 240 hours)  Resp panel by RT-PCR (RSV, Flu A&B, Covid) Anterior Nasal Swab     Status: None   Collection Time: 10/25/23 10:42 PM   Specimen: Anterior Nasal Swab  Result Value Ref Range Status  SARS Coronavirus 2 by RT PCR NEGATIVE NEGATIVE Final    Comment: (NOTE) SARS-CoV-2 target nucleic acids are NOT DETECTED.  The SARS-CoV-2 RNA is generally detectable in upper respiratory specimens during the acute phase of infection. The lowest concentration of SARS-CoV-2 viral copies this assay can detect is 138 copies/mL. A negative result does not preclude SARS-Cov-2 infection and should not be used as the sole basis for treatment or other patient management decisions. A negative result may occur with  improper specimen collection/handling, submission of specimen other than nasopharyngeal swab, presence of viral mutation(s) within the areas targeted by this assay, and inadequate number of viral copies(<138 copies/mL). A negative result must be combined with clinical observations, patient history, and epidemiological information. The expected result is Negative.  Fact Sheet for Patients:  BloggerCourse.com  Fact Sheet for Healthcare Providers:   SeriousBroker.it  This test is no t yet approved or cleared by the [place]  FDA and  has been authorized for detection and/or diagnosis of SARS-CoV-2 by FDA under an Emergency Use Authorization (EUA). This EUA will remain  in effect (meaning this test can be used) for the duration of the COVID-19 declaration under Section 564(b)(1) of the Act, 21 U.S.C.section 360bbb-3(b)(1), unless the authorization is terminated  or revoked sooner.       Influenza A by PCR NEGATIVE NEGATIVE Final   Influenza B by PCR NEGATIVE NEGATIVE Final    Comment: (NOTE) The Xpert Xpress SARS-CoV-2/FLU/RSV plus assay is intended as an aid in the diagnosis of influenza from Nasopharyngeal swab specimens and should not be used as a sole basis for treatment. Nasal washings and aspirates are unacceptable for Xpert Xpress SARS-CoV-2/FLU/RSV testing.  Fact Sheet for Patients: BloggerCourse.com  Fact Sheet for Healthcare Providers: SeriousBroker.it  This test is not yet approved or cleared by the [place]  FDA and has been authorized for detection and/or diagnosis of SARS-CoV-2 by FDA under an Emergency Use Authorization (EUA). This EUA will remain in effect (meaning this test can be used) for the duration of the COVID-19 declaration under Section 564(b)(1) of the Act, 21 U.S.C. section 360bbb-3(b)(1), unless the authorization is terminated or revoked.     Resp Syncytial Virus by PCR NEGATIVE NEGATIVE Final    Comment: (NOTE) Fact Sheet for Patients: BloggerCourse.com  Fact Sheet for Healthcare Providers: SeriousBroker.it  This test is not yet approved or cleared by the [place]  FDA and has been authorized for detection and/or diagnosis of SARS-CoV-2 by FDA under an Emergency Use Authorization (EUA). This EUA will remain in effect (meaning this test can be used) for  the duration of the COVID-19 declaration under Section 564(b)(1) of the Act, 21 U.S.C. section 360bbb-3(b)(1), unless the authorization is terminated or revoked.  Performed at [facility], [address], [place], [place] [address]   Blood culture (routine x 2)     Status: None   Collection Time: 10/26/23  5:25 PM   Specimen: BLOOD  Result Value Ref Range Status   Specimen Description   Final    BLOOD RIGHT ANTECUBITAL Performed at [facility], [address], [place], [place] [address]    Special Requests   Final    BOTTLES DRAWN AEROBIC AND ANAEROBIC Blood Culture adequate volume Performed at [facility], [address] [place], [place] [address]    Culture   Final    NO GROWTH 5 DAYS Performed at [facility], [address] [place], [place] [address]  Report Status 10/31/2023 FINAL  Final  Blood culture (routine x 2)     Status: None   Collection Time: 10/26/23  5:30 PM   Specimen: BLOOD RIGHT FOREARM  Result Value Ref Range Status   Specimen Description   Final    BLOOD RIGHT FOREARM Performed at [facility], [address] [place], [place] [address]    Special Requests   Final    BOTTLES DRAWN AEROBIC AND ANAEROBIC Blood Culture results may not be optimal due to an inadequate volume of blood received in culture bottles Performed at [facility], [address] [place], [place] [address]    Culture   Final    NO GROWTH 5 DAYS Performed at [facility], [address] [place], [place] [address]    Report Status 10/31/2023 FINAL  Final  Urine Culture     Status: Abnormal   Collection Time: 10/26/23  6:50 PM   Specimen: Urine, Random  Result Value Ref Range Status   Specimen Description   Final    URINE, RANDOM Performed at [facility], [address], [place], [place] [address]    Special Requests   Final    NONE Reflexed from  [id number] Performed at [facility], [address] [name], [place] [address]    Culture (A)  Final    >=100,000 COLONIES/mL ESCHERICHIA COLI Confirmed Extended Spectrum Beta-Lactamase Producer (ESBL).  In bloodstream infections from ESBL organisms, carbapenems are preferred over piperacillin /tazobactam. They are shown to have a lower risk of mortality.    Report Status 10/28/2023 FINAL  Final   Organism ID, Bacteria ESCHERICHIA COLI (A)  Final      Susceptibility   Escherichia coli - MIC*    AMPICILLIN >=32 RESISTANT Resistant     CEFAZOLIN  >=64 RESISTANT Resistant     CEFEPIME  16 RESISTANT Resistant     CEFTRIAXONE >=64 RESISTANT Resistant     CIPROFLOXACIN  >=4 RESISTANT Resistant     GENTAMICIN <=1 SENSITIVE Sensitive     IMIPENEM <=0.25 SENSITIVE Sensitive     NITROFURANTOIN 128 RESISTANT Resistant     TRIMETH /SULFA  <=20 SENSITIVE Sensitive     AMPICILLIN/SULBACTAM >=32 RESISTANT Resistant     PIP/TAZO 8 SENSITIVE Sensitive ug/mL    * >=100,000 COLONIES/mL ESCHERICHIA COLI         Radiology Studies: No results found.      Scheduled Meds:  amiodarone   200 mg Oral Daily   aspirin   81 mg Oral Daily   atorvastatin   40 mg Oral Daily   carvedilol   3.125 mg Oral BID   insulin  aspart  0-5 Units Subcutaneous QHS   insulin  aspart  0-6 Units Subcutaneous TID WC   rivaroxaban   20 mg Oral Daily   senna-docusate  1 tablet Oral BID   Continuous Infusions:  sodium chloride  100 mL/hr at 11/02/23 1828   piperacillin -tazobactam 3.375 g (11/02/23 1716)     LOS: 6 days    Time spent: 35 minutes    Hilda Lovings, MD [facility]   To contact the attending provider between 7A-7P or the covering provider during after hours 7P-7A, please log into the web site [email or web address] and access using universal [place] password for that web site. If you do not have the password, please call the hospital operator.  11/02/2023, 8:18 PM

## 2023-11-02 NOTE — Progress Notes (Signed)
 Physical Therapy Treatment Patient Details Name: Jeremy [name] MRN: [id number] DOB: Aug 11, 1955 Today's Date: 11/02/2023   History of Present Illness Jeremy [name] is a 68 y.o. male presents with fatigue, general malaise, and chronic right ankle pain. PMH: coronary artery disease s/p CABG, chronic HFrEF, type 2 diabetes mellitus, HTN, CKD 3A, and afib    PT Comments  Pt motivate this date, agreeable to CAM walker boot for pain control. Pt powers up using rocking momentum, CGA for safety. Pt amb 70 ft with RW, step to to slight step-through gait pattern, CGA to min A with turns and with external distractions needing assist to prevent fall. Pt tolerates remaining up in recliner at EOS with RN in room.   If plan is discharge home, recommend the following: A little help with walking and/or transfers;A little help with bathing/dressing/bathroom;Assistance with cooking/housework;Assist for transportation   Can travel by private vehicle     Yes  Equipment Recommendations  Rolling walker (2 wheels)    Recommendations for Other Services       Precautions / Restrictions Precautions Precautions: Fall Restrictions Weight Bearing Restrictions Per Provider Order: No     Mobility  Bed Mobility Overal bed mobility: Independent                  Transfers Overall transfer level: Needs assistance Equipment used: Rolling walker (2 wheels) Transfers: Sit to/from Stand Sit to Stand: Contact guard assist           General transfer comment: CGA to power up, using rocking momentum, agreeable to wear boot    Ambulation/Gait Ambulation/Gait assistance: Contact guard assist, Min assist Gait Distance (Feet): 70 Feet Assistive device: Rolling walker (2 wheels) Gait Pattern/deviations: Step-to pattern, Decreased stride length Gait velocity: decreased     General Gait Details: pt agreeable to CAM walker boot, denies pain, step-to gait pattern progressing to slight step-through, CGA  for striaght line gait and min A for turns, slight LOB with external distraction as someone passed by needing min A to prevent LOB   Stairs             Wheelchair Mobility     Tilt Bed    Modified Rankin (Stroke Patients Only)       Balance Overall balance assessment: Needs assistance   Sitting balance-Leahy Scale: Good     Standing balance support: Reliant on assistive device for balance, During functional activity, Bilateral upper extremity supported Standing balance-Leahy Scale: Poor                              Communication Communication Communication: No apparent difficulties  Cognition Arousal: Alert Behavior During Therapy: WFL for tasks assessed/performed   PT - Cognitive impairments: No family/caregiver present to determine baseline                       PT - Cognition Comments: follows 1 step commands, motivated, singing intermittently Following commands: Intact      Cueing    Exercises      General Comments        Pertinent Vitals/Pain Pain Assessment Pain Assessment: No/denies pain    Home Living                          Prior Function            PT Goals (current goals can now be found in  the care plan section) Acute Rehab PT Goals Patient Stated Goal: "I need new feet" PT Goal Formulation: With patient Time For Goal Achievement: 11/11/23 Potential to Achieve Goals: Fair Progress towards PT goals: Progressing toward goals    Frequency    Min 2X/week      PT Plan      Co-evaluation              AM-PAC PT "6 Clicks" Mobility   Outcome Measure  Help needed turning from your back to your side while in a flat bed without using bedrails?: None Help needed moving from lying on your back to sitting on the side of a flat bed without using bedrails?: None Help needed moving to and from a bed to a chair (including a wheelchair)?: A Little Help needed standing up from a chair using your  arms (e.g., wheelchair or bedside chair)?: A Little Help needed to walk in hospital room?: A Little Help needed climbing 3-5 steps with a railing? : A Lot 6 Click Score: 19    End of Session Equipment Utilized During Treatment: Gait belt Activity Tolerance: Patient tolerated treatment well Patient left: in chair;with call bell/phone within reach;with chair alarm set;with nursing/sitter in room Nurse Communication: Mobility status PT Visit Diagnosis: Unsteadiness on feet (R26.81);Difficulty in walking, not elsewhere classified (R26.2);Pain Pain - Right/Left: Right Pain - part of body: Ankle and joints of foot     Time: 0940-1000 PT Time Calculation (min) (ACUTE ONLY): 20 min  Charges:    $Gait Training: 8-22 mins PT General Charges $$ ACUTE PT VISIT: 1 Visit                     Tori Amye Grego PT, DPT 11/02/23, 10:24 AM

## 2023-11-03 ENCOUNTER — Other Ambulatory Visit (HOSPITAL_COMMUNITY): Payer: Self-pay

## 2023-11-03 ENCOUNTER — Encounter (HOSPITAL_COMMUNITY): Payer: Self-pay | Admitting: Internal Medicine

## 2023-11-03 DIAGNOSIS — E785 Hyperlipidemia, unspecified: Secondary | ICD-10-CM | POA: Diagnosis not present

## 2023-11-03 DIAGNOSIS — I2510 Atherosclerotic heart disease of native coronary artery without angina pectoris: Secondary | ICD-10-CM | POA: Diagnosis not present

## 2023-11-03 DIAGNOSIS — N179 Acute kidney failure, unspecified: Secondary | ICD-10-CM | POA: Diagnosis not present

## 2023-11-03 DIAGNOSIS — N390 Urinary tract infection, site not specified: Secondary | ICD-10-CM | POA: Diagnosis not present

## 2023-11-03 LAB — GLUCOSE, CAPILLARY
Glucose-Capillary: 97 mg/dL (ref 70–99)
Glucose-Capillary: 131 mg/dL — ABNORMAL HIGH (ref 70–99)
Glucose-Capillary: 130 mg/dL — ABNORMAL HIGH (ref 70–99)
Glucose-Capillary: 126 mg/dL — ABNORMAL HIGH (ref 70–99)

## 2023-11-03 LAB — BASIC METABOLIC PANEL WITH GFR
Anion gap: 8 (ref 5–15)
BUN: 32 mg/dL — ABNORMAL HIGH (ref 8–23)
CO2: 18 mmol/L — ABNORMAL LOW (ref 22–32)
Calcium: 9.5 mg/dL (ref 8.9–10.3)
Chloride: 110 mmol/L (ref 98–111)
Creatinine, Ser: 1.7 mg/dL — ABNORMAL HIGH (ref 0.61–1.24)
GFR, Estimated: 44 mL/min — ABNORMAL LOW (ref >60)
Glucose, Bld: 105 mg/dL — ABNORMAL HIGH (ref 70–99)
Potassium: 4.3 mmol/L (ref 3.5–5.1)
Sodium: 136 mmol/L (ref 135–145)

## 2023-11-03 LAB — MAGNESIUM: Magnesium: 2 mg/dL (ref 1.7–2.4)

## 2023-11-03 NOTE — Plan of Care (Signed)
   Problem: Coping: Goal: Ability to adjust to condition or change in health will improve Outcome: Progressing

## 2023-11-03 NOTE — Plan of Care (Signed)
   Problem: Coping: Goal: Ability to adjust to condition or change in health will improve Outcome: Progressing   Problem: Fluid Volume: Goal: Ability to maintain a balanced intake and output will improve Outcome: Progressing   Problem: Metabolic: Goal: Ability to maintain appropriate glucose levels will improve Outcome: Progressing

## 2023-11-03 NOTE — TOC Progression Note (Signed)
 Transition of Care [facility]) - Progression Note    Patient Details  Name: Jeremy [name] MRN: [id number] Date of Birth: 1956/02/18  [facility] CM/SW Contact  Bari Leys, RN Phone Number: 11/03/2023, 1:28 PM  Clinical Narrative:  Notification received from [facility], admit coordinator w/[facility], reports no bed available today an plan for transfer tomorrow morning, team notified.       Expected Discharge Plan: Skilled Nursing Facility    Expected Discharge Plan and Services       Living arrangements for the past 2 months: Homeless                                       Social Determinants of Health (SDOH) Interventions SDOH Screenings   Food Insecurity: No Food Insecurity (10/27/2023)  Housing: Low Risk  (10/27/2023)  Transportation Needs: No Transportation Needs (10/27/2023)  Utilities: Not At Risk (10/27/2023)  Depression (PHQ2-9): Low Risk  (10/30/2018)  Financial Resource Strain: Low Risk  (06/03/2023)   Received from [facility]  Recent Concern: Financial Resource Strain - High Risk (03/22/2023)   [facility]  Social Connections: Patient Declined (10/27/2023)  Tobacco Use: High Risk (10/26/2023)    Readmission Risk Interventions    10/28/2023   10:12 AM 08/09/2023    2:42 PM  Readmission Risk Prevention Plan  Transportation Screening Complete Complete  Medication Review (RN Care Manager) Complete   PCP or Specialist appointment within 3-5 days of discharge Complete   HRI or Home Care Consult Complete --  SW Recovery Care/Counseling Consult Complete   Palliative Care Screening Not Applicable Not Applicable  Skilled Nursing Facility Complete Not Applicable

## 2023-11-03 NOTE — Progress Notes (Signed)
 PROGRESS NOTE    Jeremy [name]  ZOX:[id number] DOB: [birth year] DOA: 10/26/2023 PCP: Antonio Baumgarten, MD    Chief Complaint  Patient presents with   Ankle Pain    Brief Narrative:  68 y.o. male with medical history significant for coronary artery disease status post CABG, chronic HFrEF, type 2 diabetes mellitus, hypertension, CKD 3A, and atrial fibrillation on Xarelto  presented with fatigue, malaise and chronic right ankle pain.  In the ED, workup showed potassium of 5.5, creatinine of 1.53 and UA suggestive of UTI.  He was started on broad-spectrum antibiotics.  Subsequently, urine culture grew E. coli.  PT recommended SNF placement.  TOC consulted.  Awaiting SNF placement.   Assessment & Plan:   Principal Problem:   UTI due to extended-spectrum beta lactamase (ESBL) producing Escherichia coli Active Problems:   AKI (acute kidney injury) (HCC)   Essential hypertension   Diabetes mellitus, type 2 (HCC)   Stage 3 chronic kidney disease (HCC)   HFrEF (heart failure with reduced ejection fraction) (HCC)   PAF (paroxysmal atrial fibrillation) (HCC)   Dyslipidemia   Paroxysmal atrial fibrillation (HCC)   Coronary artery disease   Type 2 diabetes mellitus with chronic kidney disease, with long-term current use of insulin  (HCC)   Cystitis   Charcot joint of right ankle   #1 ESBL E. coli UTI -Clinical improvement. - Status post 7 days IV Zosyn .  2.  Hyperkalemia -Resolved.  3.  CAD/hypertension/hyperlipidemia -Stable. - Continue aspirin , statin, Coreg .  4.  Paroxysmal atrial fibrillation -Currently rate controlled on Coreg  and amiodarone . - Xarelto  for anticoagulation.  5.  Chronic systolic heart failure -Euvolemic on examination. - Continue Coreg , statin. - Outpatient follow-up with cardiology.  6.  Right ankle pain - Patient with history of chronic right ankle pain - ABIs obtained with abnormal TBI's. - Patient underwent CT of the right foot which were reviewed  by podiatry and showed no acute changes compared to prior imaging. - No signs of clinical or radiographic infection per podiatry. - Podiatry recommending conservative treatment at this time. - Podiatry was following but signed off on 10/29/2023. - Outpatient follow-up with podiatry.  7.  Diabetes mellitus type 2 -Hemoglobin A1c 6.4 (06/26/2023) - CBG 97 this morning.  - SSI.  8.  AKI on CKD stage IIIa -Baseline creatinine approximately 1.3-1.6. - Creatinine noted at 1.97 on 10/31/2023. - Slowly improving with gentle hydration creatinine seems to be plateauing currently at 1.70 today. - Continue IV fluids. - Repeat labs in the AM.  9.  Anemia of chronic disease -H&H stable.  10.  Physical deconditioning -Patient seen by PT who are recommending SNF placement.  11.  Homelessness -TOC following.  12.  Obesity class I -Lifestyle modification. - Outpatient follow-up with PCP.    DVT prophylaxis: Xarelto  Code Status: Full Family Communication: Updated patient.  No family at bedside. Disposition: Patient medically stable as of 11/03/2023.  SNF when bed available.  Status is: Inpatient Remains inpatient appropriate because: Unsafe disposition   Consultants:  Podiatry: Dr. Rosemarie Conquest 10/28/2023  Procedures:  CT right ankle 10/28/2023 Chest x-ray 10/25/2023 ABIs with TBI 10/30/2023  Antimicrobials:  Anti-infectives (From admission, onward)    Start     Dose/Rate Route Frequency Ordered Stop   11/02/23 1000  piperacillin -tazobactam (ZOSYN ) IVPB 3.375 g        3.375 g 12.5 mL/hr over 240 Minutes Intravenous Every 8 hours 11/02/23 0911 11/02/23 2116   10/27/23 0400  piperacillin -tazobactam (ZOSYN ) IVPB 3.375 g  3.375 g 12.5 mL/hr over 240 Minutes Intravenous Every 8 hours 10/26/23 2255 11/01/23 2359   10/26/23 1930  cefTRIAXone (ROCEPHIN) 1 g in sodium chloride  0.9 % 100 mL IVPB  Status:  Discontinued        1 g 200 mL/hr over 30 Minutes Intravenous  Once 10/26/23 1926  10/26/23 1927   10/26/23 1930  piperacillin -tazobactam (ZOSYN ) IVPB 3.375 g        3.375 g 100 mL/hr over 30 Minutes Intravenous  Once 10/26/23 1927 10/26/23 2214         Subjective: Patient laying in bed eating his lunch.  Denies any chest pain no abdominal pain.  No shortness of breath.  Feels well.  Objective: Vitals:   11/02/23 1436 11/02/23 2053 11/02/23 2128 11/03/23 0525  BP: 121/60  138/82 115/66  Pulse: 80  85 (!) 54  Resp: 17  17 17   Temp: 97.7 F (36.5 C)  99 F (37.2 C) 98.5 F (36.9 C)  TempSrc: Oral  Oral Oral  SpO2: 100%  97% 100%  Weight:  101 kg    Height:        Intake/Output Summary (Last 24 hours) at 11/03/2023 1254 Last data filed at 11/03/2023 1132 Gross per 24 hour  Intake 3141.99 ml  Output 3050 ml  Net 91.99 ml   Filed Weights   10/30/23 0500 11/01/23 0500 11/02/23 2053  Weight: 101.7 kg 100.2 kg 101 kg    Examination:  General exam: NAD.  Poor dentition. Respiratory system: Lungs clear to auscultation bilaterally.  No wheezes, no crackles, no rhonchi.  Fair air movement.  Speaking in full sentences.   Cardiovascular system: Regular rate rhythm no murmurs rubs or gallops.  No JVD.  No lower extremity edema.  Gastrointestinal system: Abdomen is soft, nontender, nondistended, positive bowel sounds.  No rebound.  No guarding. Central nervous system: Alert and oriented. No focal neurological deficits. Extremities: Symmetric 5 x 5 power. Skin: No rashes, lesions or ulcers Psychiatry: Judgement and insight appear normal. Mood & affect appropriate.     Data Reviewed: I have personally reviewed following labs and imaging studies  CBC: Recent Labs  Lab 10/28/23 0332 10/31/23 0319 11/02/23 0338  WBC 7.0 5.3 5.8  NEUTROABS  --  2.0 2.0  HGB 11.7* 13.7 13.0  HCT 37.3* 44.1 40.4  MCV 98.7 97.6 95.3  PLT 250 322 347    Basic Metabolic Panel: Recent Labs  Lab 10/30/23 0356 10/31/23 0319 11/01/23 0332 11/02/23 0338 11/03/23 0403  NA  136 136 138 135 136  K 4.3 4.3 4.5 4.1 4.3  CL 108 105 108 107 110  CO2 20* 22 20* 20* 18*  GLUCOSE 110* 143* 104* 121* 105*  BUN 28* 29* 30* 29* 32*  CREATININE 1.60* 1.97* 1.84* 1.84* 1.70*  CALCIUM  9.3 9.7 9.6 9.5 9.5  MG 2.1 2.2 2.2 2.2 2.0    GFR: Estimated Creatinine Clearance: 51.8 mL/min (A) (by C-G formula based on SCr of 1.7 mg/dL (H)).  Liver Function Tests: No results for input(s): "AST", "ALT", "ALKPHOS", "BILITOT", "PROT", "ALBUMIN" in the last 168 hours.   CBG: Recent Labs  Lab 11/02/23 1136 11/02/23 1640 11/02/23 2125 11/03/23 0804 11/03/23 1115  GLUCAP 134* 121* 134* 97 131*     Recent Results (from the past 240 hours)  Resp panel by RT-PCR (RSV, Flu A&B, Covid) Anterior Nasal Swab     Status: None   Collection Time: 10/25/23 10:42 PM   Specimen: Anterior Nasal Swab  Result Value  Ref Range Status   SARS Coronavirus 2 by RT PCR NEGATIVE NEGATIVE Final    Comment: (NOTE) SARS-CoV-2 target nucleic acids are NOT DETECTED.  The SARS-CoV-2 RNA is generally detectable in upper respiratory specimens during the acute phase of infection. The lowest concentration of SARS-CoV-2 viral copies this assay can detect is 138 copies/mL. A negative result does not preclude SARS-Cov-2 infection and should not be used as the sole basis for treatment or other patient management decisions. A negative result may occur with  improper specimen collection/handling, submission of specimen other than nasopharyngeal swab, presence of viral mutation(s) within the areas targeted by this assay, and inadequate number of viral copies(<138 copies/mL). A negative result must be combined with clinical observations, patient history, and epidemiological information. The expected result is Negative.  Fact Sheet for Patients:  BloggerCourse.com  Fact Sheet for Healthcare Providers:  SeriousBroker.it  This test is no t yet approved or  cleared by the [place]  FDA and  has been authorized for detection and/or diagnosis of SARS-CoV-2 by FDA under an Emergency Use Authorization (EUA). This EUA will remain  in effect (meaning this test can be used) for the duration of the COVID-19 declaration under Section 564(b)(1) of the Act, 21 U.S.C.section 360bbb-3(b)(1), unless the authorization is terminated  or revoked sooner.       Influenza A by PCR NEGATIVE NEGATIVE Final   Influenza B by PCR NEGATIVE NEGATIVE Final    Comment: (NOTE) The Xpert Xpress SARS-CoV-2/FLU/RSV plus assay is intended as an aid in the diagnosis of influenza from Nasopharyngeal swab specimens and should not be used as a sole basis for treatment. Nasal washings and aspirates are unacceptable for Xpert Xpress SARS-CoV-2/FLU/RSV testing.  Fact Sheet for Patients: BloggerCourse.com  Fact Sheet for Healthcare Providers: SeriousBroker.it  This test is not yet approved or cleared by the [place]  FDA and has been authorized for detection and/or diagnosis of SARS-CoV-2 by FDA under an Emergency Use Authorization (EUA). This EUA will remain in effect (meaning this test can be used) for the duration of the COVID-19 declaration under Section 564(b)(1) of the Act, 21 U.S.C. section 360bbb-3(b)(1), unless the authorization is terminated or revoked.     Resp Syncytial Virus by PCR NEGATIVE NEGATIVE Final    Comment: (NOTE) Fact Sheet for Patients: BloggerCourse.com  Fact Sheet for Healthcare Providers: SeriousBroker.it  This test is not yet approved or cleared by the [place]  FDA and has been authorized for detection and/or diagnosis of SARS-CoV-2 by FDA under an Emergency Use Authorization (EUA). This EUA will remain in effect (meaning this test can be used) for the duration of the COVID-19 declaration under Section 564(b)(1) of the Act, 21  U.S.C. section 360bbb-3(b)(1), unless the authorization is terminated or revoked.  Performed at [facility], [address] [place], [place] [address]   Blood culture (routine x 2)     Status: None   Collection Time: 10/26/23  5:25 PM   Specimen: BLOOD  Result Value Ref Range Status   Specimen Description   Final    BLOOD RIGHT ANTECUBITAL Performed at [facility], [address] [place], [place] [address]    Special Requests   Final    BOTTLES DRAWN AEROBIC AND ANAEROBIC Blood Culture adequate volume Performed at [facility], [address] [place], [place] [address]    Culture   Final    NO GROWTH 5 DAYS Performed at [facility], [address], [place],  [place] [address]    Report Status 10/31/2023 FINAL  Final  Blood culture (routine x 2)     Status: None   Collection Time: 10/26/23  5:30 PM   Specimen: BLOOD RIGHT FOREARM  Result Value Ref Range Status   Specimen Description   Final    BLOOD RIGHT FOREARM Performed at [facility], [address] [place], [place] [address]    Special Requests   Final    BOTTLES DRAWN AEROBIC AND ANAEROBIC Blood Culture results may not be optimal due to an inadequate volume of blood received in culture bottles Performed at [facility], [address], [place], [place] [address]    Culture   Final    NO GROWTH 5 DAYS Performed at [facility], [address], [place], [place] [address]    Report Status 10/31/2023 FINAL  Final  Urine Culture     Status: Abnormal   Collection Time: 10/26/23  6:50 PM   Specimen: Urine, Random  Result Value Ref Range Status   Specimen Description   Final    URINE, RANDOM Performed at [facility], [address], [place], [place] [address]    Special Requests   Final    NONE Reflexed from [id number] Performed at [facility], [address] [place], [place]  [address]    Culture (A)  Final    >=100,000 COLONIES/mL ESCHERICHIA COLI Confirmed Extended Spectrum Beta-Lactamase Producer (ESBL).  In bloodstream infections from ESBL organisms, carbapenems are preferred over piperacillin /tazobactam. They are shown to have a lower risk of mortality.    Report Status 10/28/2023 FINAL  Final   Organism ID, Bacteria ESCHERICHIA COLI (A)  Final      Susceptibility   Escherichia coli - MIC*    AMPICILLIN >=32 RESISTANT Resistant     CEFAZOLIN  >=64 RESISTANT Resistant     CEFEPIME  16 RESISTANT Resistant     CEFTRIAXONE >=64 RESISTANT Resistant     CIPROFLOXACIN  >=4 RESISTANT Resistant     GENTAMICIN <=1 SENSITIVE Sensitive     IMIPENEM <=0.25 SENSITIVE Sensitive     NITROFURANTOIN 128 RESISTANT Resistant     TRIMETH /SULFA  <=20 SENSITIVE Sensitive     AMPICILLIN/SULBACTAM >=32 RESISTANT Resistant     PIP/TAZO 8 SENSITIVE Sensitive ug/mL    * >=100,000 COLONIES/mL ESCHERICHIA COLI         Radiology Studies: No results found.      Scheduled Meds:  amiodarone   200 mg Oral Daily   aspirin   81 mg Oral Daily   atorvastatin   40 mg Oral Daily   carvedilol   3.125 mg Oral BID   insulin  aspart  0-5 Units Subcutaneous QHS   insulin  aspart  0-6 Units Subcutaneous TID WC   rivaroxaban   20 mg Oral Daily   senna-docusate  1 tablet Oral BID   Continuous Infusions:  sodium chloride  100 mL/hr at 11/03/23 0339     LOS: 7 days    Time spent: 35 minutes    Hilda Lovings, MD [facility]   To contact the attending provider between 7A-7P or the covering provider during after hours 7P-7A, please log into the web site [email or web address] and access using universal [place] password for that web site. If you do not have the password, please call the hospital operator.  11/03/2023, 12:54 PM

## 2023-11-04 DIAGNOSIS — N179 Acute kidney failure, unspecified: Secondary | ICD-10-CM | POA: Diagnosis not present

## 2023-11-04 DIAGNOSIS — I5020 Unspecified systolic (congestive) heart failure: Secondary | ICD-10-CM | POA: Diagnosis not present

## 2023-11-04 DIAGNOSIS — E1122 Type 2 diabetes mellitus with diabetic chronic kidney disease: Secondary | ICD-10-CM | POA: Diagnosis not present

## 2023-11-04 DIAGNOSIS — N390 Urinary tract infection, site not specified: Secondary | ICD-10-CM | POA: Diagnosis not present

## 2023-11-04 LAB — BASIC METABOLIC PANEL WITH GFR
Anion gap: 7 (ref 5–15)
BUN: 30 mg/dL — ABNORMAL HIGH (ref 8–23)
CO2: 20 mmol/L — ABNORMAL LOW (ref 22–32)
Calcium: 9.5 mg/dL (ref 8.9–10.3)
Chloride: 109 mmol/L (ref 98–111)
Creatinine, Ser: 1.65 mg/dL — ABNORMAL HIGH (ref 0.61–1.24)
GFR, Estimated: 46 mL/min — ABNORMAL LOW (ref >60)
Glucose, Bld: 115 mg/dL — ABNORMAL HIGH (ref 70–99)
Potassium: 4.4 mmol/L (ref 3.5–5.1)
Sodium: 136 mmol/L (ref 135–145)

## 2023-11-04 LAB — GLUCOSE, CAPILLARY
Glucose-Capillary: 94 mg/dL (ref 70–99)
Glucose-Capillary: 126 mg/dL — ABNORMAL HIGH (ref 70–99)
Glucose-Capillary: 112 mg/dL — ABNORMAL HIGH (ref 70–99)

## 2023-11-04 LAB — MAGNESIUM: Magnesium: 2.2 mg/dL (ref 1.7–2.4)

## 2023-11-04 MED ORDER — TRAMADOL HCL 50 MG PO TABS: 50.0000 mg | ORAL_TABLET | Freq: Four times a day (QID) | ORAL | 0 refills | Status: DC | PRN

## 2023-11-04 MED ORDER — SENNOSIDES-DOCUSATE SODIUM 8.6-50 MG PO TABS: 1.0000 | ORAL_TABLET | Freq: Two times a day (BID) | ORAL | Status: DC

## 2023-11-04 MED ORDER — POLYETHYLENE GLYCOL 3350 17 G PO PACK: 17.0000 g | PACK | Freq: Every day | ORAL | Status: DC | PRN

## 2023-11-04 NOTE — Discharge Summary (Signed)
 Physician Discharge Summary  Jeremy [name] MWU:[id number] DOB: Jeremy [name] 11, 1957 DOA: 10/26/2023  PCP: Jeremy Baumgarten, MD  Admit date: 10/26/2023 Discharge date: 11/04/2023  Time spent: 60 minutes  Recommendations for Outpatient Follow-up:  Follow-up with MD at SNF.  Patient will need a basic metabolic profile done in 1 week to follow-up on electrolytes and renal function. Follow-up with Dr. Beau [name], cardiology in 2 weeks.  On follow-up patient's cardiac medications will need to be reassessed. Follow-up with Dr. Rosemarie [name], podiatry in 3 weeks.   Discharge Diagnoses:  Principal Problem:   UTI due to extended-spectrum beta lactamase (ESBL) producing Escherichia coli Active Problems:   AKI (acute kidney injury) (HCC)   Essential hypertension   Diabetes mellitus, type 2 (HCC)   Stage 3 chronic kidney disease (HCC)   HFrEF (heart failure with reduced ejection fraction) (HCC)   PAF (paroxysmal atrial fibrillation) (HCC)   Dyslipidemia   Paroxysmal atrial fibrillation (HCC)   Coronary artery disease   Type 2 diabetes mellitus with chronic kidney disease, with long-term current use of insulin  (HCC)   Cystitis   Charcot joint of right ankle   Discharge Condition: Stable and improved  Diet recommendation: Heart healthy  Filed Weights   11/01/23 0500 11/02/23 2053 11/04/23 0500  Weight: 100.2 kg 101 kg 99.6 kg    History of present illness:  HPI per Dr. Amey Ka [name] is a 68 y.o. male with medical history significant for coronary artery disease status post CABG, chronic HFrEF, type 2 diabetes mellitus, hypertension, CKD 3A, and atrial fibrillation on Xarelto  who presents with fatigue, general malaise, and chronic right ankle pain.   Patient is a difficult historian, preferring not to answer questions.  He does relate that he has been feeling generally poor with increased fatigue, and general malaise.  He complains of right foot and ankle pain that has been bothering him for  years.  He denies fevers or chills.  He initially denies dysuria but later indicates that he has been experiencing dysuria.  He denies flank pain.   ED Course: Upon arrival to the ED, patient is found to be afebrile and saturating well on room air with mild tachycardia and stable BP.  Labs are most notable for potassium 5.5, creatinine 1.53, normal WBC, and normal lactate.   Blood and urine cultures were collected in the ED and the patient was given a dose of Zosyn .  Hospital Course:  #1 ESBL E. coli UTI - Urinalysis done on admission concerning for UTI.   - Patient placed empirically on IV antibiotics pending urine cultures.  -Urine cultures grew ESBL E. coli and patient received a 7-day course of IV Zosyn  during the hospitalization.   - No further antibiotics needed on discharge.    2.  Hyperkalemia -Resolved.   3.  CAD/hypertension/hyperlipidemia -Stable. - Patient maintained on aspirin , statin, Coreg .   4.  Paroxysmal atrial fibrillation - Remained rate controlled on Coreg  and amiodarone .   - Patient maintained on Xarelto  for anticoagulation.    5.  Chronic systolic heart failure -Euvolemic on examination during the hospitalization. - Patient maintained on home regimen Coreg , statin. -Patient noted to have been on Entresto  and Demadex  per med rec however noted to have been paused since February/2025. -Will need outpatient follow-up with primary cardiologist to determine when Entresto  and Demadex  may be resumed. - Outpatient follow-up with cardiology.   6.  Right ankle pain - Patient with history of chronic right ankle pain - ABIs obtained with abnormal TBI's. - Patient underwent CT  of the right foot which were reviewed by podiatry and showed no acute changes compared to prior imaging. - No signs of clinical or radiographic infection per podiatry. - Podiatry recommended conservative treatment at this time. - Podiatry was following but signed off on 10/29/2023. - Outpatient  follow-up with podiatry.   7.  Diabetes mellitus type 2 -Hemoglobin A1c 6.4 (06/26/2023) - Patient's metformin  held during the hospitalization patient maintained on sliding scale insulin .   - Metformin  will be resumed on discharge.     8.  AKI on CKD stage IIIa -Baseline creatinine approximately 1.3-1.6. - Creatinine noted at 1.97 on 10/31/2023. - Patient gently hydrated with IV fluids with improvement with renal function stabilizing at 1.7.  - Per med rec patient noted to have been on Entresto  and Demadex  prior to admission however per med rec notes that medications were paused since 07/2023.  Patient did not receive Entresto  Demadex  during the hospitalization. -Outpatient follow-up.   9.  Anemia of chronic disease -H&H stable.   10.  Physical deconditioning -Patient seen by PT who are recommending SNF placement. - Patient will be discharged to skilled nursing facility.   11.  Homelessness -TOC following.   12.  Obesity class I -Lifestyle modification. - Outpatient follow-up with PCP.    Procedures: CT right ankle 10/28/2023 Chest x-ray 10/25/2023 ABIs with TBI 10/30/2023  Consultations: Podiatry: Dr. Rosemarie [name] 10/28/2023    Discharge Exam: Vitals:   11/03/23 2133 11/04/23 0555  BP: (!) 142/99 112/71  Pulse: 73 93  Resp: 16 17  Temp: 97.6 F (36.4 C) (!) 97.5 F (36.4 C)  SpO2: 100% 98%    General: NAD Cardiovascular: RRR no murmurs rubs or gallops.  No JVD.  No pitting lower extremity edema. Respiratory: Clear to auscultation bilaterally.  No wheezes, no crackles, no rhonchi.  Fair air movement.  Speaking in full sentences.  Discharge Instructions   Discharge Instructions     Diet - low sodium heart healthy   Complete by: As directed    Increase activity slowly   Complete by: As directed    Increase activity slowly   Complete by: As directed       Allergies as of 11/04/2023   No Known Allergies      Medication List     STOP taking these  medications    azithromycin  250 MG tablet Commonly known as: Zithromax    guaiFENesin -codeine  100-10 MG/5ML syrup   sacubitril -valsartan  24-26 MG Commonly known as: ENTRESTO    torsemide  20 MG tablet Commonly known as: DEMADEX        TAKE these medications    acetaminophen  500 MG tablet Commonly known as: TYLENOL  Take 1 tablet (500 mg total) by mouth every 6 (six) hours as needed.   amiodarone  200 MG tablet Commonly known as: PACERONE  Take 1 tablet (200 mg total) by mouth daily.   Aspirin  Low Dose 81 MG chewable tablet Generic drug: aspirin  Chew 1 tablet (81 mg total) by mouth daily.   atorvastatin  40 MG tablet Commonly known as: LIPITOR Take 1 tablet (40 mg total) by mouth daily.   carvedilol  3.125 MG tablet Commonly known as: COREG  Take 1 tablet (3.125 mg total) by mouth 2 (two) times daily. Reduced from 6.25 mg.   metFORMIN  1000 MG tablet Commonly known as: GLUCOPHAGE  Take 1 tablet (1,000 mg total) by mouth once daily with breakfast.   nitroGLYCERIN  0.4 MG SL tablet Commonly known as: NITROSTAT  Place 1 tablet (0.4 mg total) under the tongue every 5 (five) minutes as  needed for chest pain.   pantoprazole  20 MG tablet Commonly known as: PROTONIX  Take 1 tablet (20 mg total) by mouth daily.   polyethylene glycol 17 g packet Commonly known as: MIRALAX  / GLYCOLAX  Take 17 g by mouth daily as needed for moderate constipation.   senna-docusate 8.6-50 MG tablet Commonly known as: Senokot-S Take 1 tablet by mouth 2 (two) times daily.   traMADol  50 MG tablet Commonly known as: Ultram  Take 1 tablet (50 mg total) by mouth every 6 (six) hours as needed. What changed: when to take this   Xarelto  20 MG Tabs tablet Generic drug: rivaroxaban  Take 1 tablet (20 mg total) by mouth daily at 5 pm               Durable Medical Equipment  (From admission, onward)           Start     Ordered   11/02/23 1240  For home use only DME Walker rolling  Once        Question Answer Comment  Walker: With 5 Inch Wheels   Patient needs a walker to treat with the following condition Debility      11/02/23 1239           No Known Allergies  Contact information for follow-up providers     Burney Carter D, MD. Schedule an appointment as soon as possible for a visit in 2 week(s).   Specialties: Cardiology, Internal Medicine Why: Follow-up in 2 weeks Contact information: [address] [name] [address] [phone]         MD at snf Follow up.          Standiford, Karlene Overcast, DPM Follow up in 3 week(s).   Specialty: Podiatry Contact information: [address] [place] [address] [phone]              Contact information for after-discharge care     Destination     [facility] Preferred SNF .   Service: Skilled Nursing Contact information: [address] [place]  [address] [phone]                      The results of significant diagnostics from this hospitalization (including imaging, microbiology, ancillary and laboratory) are listed below for reference.    Significant Diagnostic Studies: VAS US  ABI WITH/WO TBI Result Date: 10/31/2023  LOWER EXTREMITY DOPPLER STUDY Patient Name:  Dujuan Stankowski  Date of Exam:   10/30/2023 Medical Rec #: [id number]        Accession #:    [id number] Date of Birth: 23-Nov-1955       Patient Gender: M Patient Age:   59 years Exam Location:  [facility] Procedure:      VAS US  ABI WITH/WO TBI Referring Phys: --------------------------------------------------------------------------------  Indications: Peripheral artery disease. High Risk Factors: Hypertension, hyperlipidemia, Diabetes, current smoker, prior                    MI, coronary artery disease. Other Factors: CKD, CHF, Afib, Hx CABG, HX of polysubstance abuse.  Comparison Study: No previous exams Performing Technologist: Hill,  Jody RVT, RDMS  Examination Guidelines: A complete evaluation includes at minimum, Doppler waveform signals and systolic blood pressure reading at the level of bilateral brachial, anterior tibial, and posterior tibial arteries, when vessel segments are accessible. Bilateral testing is considered an integral part of a complete examination. Photoelectric Plethysmograph (PPG) waveforms and toe  systolic pressure readings are included as required and additional duplex testing as needed. Limited examinations for reoccurring indications may be performed as noted.  ABI Findings: +---------+------------------+-----+----------+--------+ Right    Rt Pressure (mmHg)IndexWaveform  Comment  +---------+------------------+-----+----------+--------+ Brachial 126                    triphasic          +---------+------------------+-----+----------+--------+ PTA      148               1.13 triphasic          +---------+------------------+-----+----------+--------+ DP       96                0.73 monophasic         +---------+------------------+-----+----------+--------+ Great Toe69                0.53 Normal             +---------+------------------+-----+----------+--------+ +---------+------------------+-----+---------+-------+ Left     Lt Pressure (mmHg)IndexWaveform Comment +---------+------------------+-----+---------+-------+ Brachial 131                    triphasic        +---------+------------------+-----+---------+-------+ PTA      151               1.15 triphasic        +---------+------------------+-----+---------+-------+ DP       115               0.88 biphasic         +---------+------------------+-----+---------+-------+ Great Toe60                0.46 Normal           +---------+------------------+-----+---------+-------+ +-------+-----------+-----------+------------+------------+ ABI/TBIToday's ABIToday's TBIPrevious ABIPrevious TBI  +-------+-----------+-----------+------------+------------+ Right  1.13       0.53                                +-------+-----------+-----------+------------+------------+ Left   1.15       0.46                                +-------+-----------+-----------+------------+------------+  Summary: Right: Resting right ankle-brachial index is within normal range. The right toe-brachial index is abnormal. Left: Resting left ankle-brachial index is within normal range. The left toe-brachial index is abnormal. *See table(s) above for measurements and observations.  Electronically signed by Runell Countryman on 10/31/2023 at 3:40:49 PM.    Final    CT ANKLE RIGHT WO CONTRAST Result Date: 10/28/2023 CLINICAL DATA:  Evaluate for fibular malunion. Charcot arthropathy versus osteomyelitis in distal fibula/tibia. EXAM: CT OF THE RIGHT ANKLE WITHOUT CONTRAST TECHNIQUE: Multidetector CT imaging of the right ankle was performed according to the standard protocol. Multiplanar CT image reconstructions were also generated. RADIATION DOSE REDUCTION: This exam was performed according to the departmental dose-optimization program which includes automated exposure control, adjustment of the mA and/or kV according to patient size and/or use of iterative reconstruction technique. 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 There is diffuse sclerosis throughout both fracture components. Postsurgical changes are again seen of lateral plate and screw fixation of the distal fibular diaphysis through the lateral  tibia. The most distal screw again has no purchase on the distal talus (coronal series 7 images 38 through 43). Unchanged lucency measuring up to approximately 8 mm in craniocaudal length just distal to the  distal fibular plate, within the far lateral aspect of the anterior process of the calcaneus, consistent with erosion from contact with the distal plate with ankle movement. There is unchanged 2 mm lucency around the far proximal aspect of the fibular plate within the fibular diaphysis (coronal series 7, image 50). There is again a generalized lucency measuring up to approximately 1.9 cm in transverse dimension and 2.5 cm in craniocaudal dimension within the distal tibial metaphysis (coronal series 7, image 39) around the mid to distal aspect of the long distal tibiofibular syndesmotic screw. There is again solid tibiotalar fusion. There is again bone-on-bone contact of the distal fibula and the posterior aspect of the lateral talus with partial fusion, very similar to prior CT (coronal series 7 images 42 through 48). Moderate plantar and mild posterior calcaneal heel spurs. No acute fracture or dislocation. Ligaments Suboptimally assessed by CT. Muscles and Tendons Mild fatty infiltration of the distal soleus muscle, unchanged. No gross tendon tear is seen. Soft tissues Mild diffuse soft tissue swelling, greatest within the lateral ankle and dorsal midfoot. Unchanged to minimally improved shallow focal soft tissue 2 ulceration 2 within the superolateral ankle just superior to the head of the syndesmotic screw (coronal series 8, image 44 and axial series 5, image 96). IMPRESSION: 1. 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 2. No definite cortical erosion adjacent to a region of soft tissue ulceration or suspected infection to specifically suggest acute osteomyelitis. 3. Unchanged lucency measuring up to approximately 8 mm in craniocaudal length just distal to the distal fibular plate, within the far lateral aspect of the anterior process of the calcaneus, consistent with erosion from contact with the  distal plate with ankle movement. 4. Unchanged generalized lucency measuring up to approximately 1.9 cm in transverse dimension and 2.5 cm in craniocaudal dimension within the distal tibial metaphysis around the mid to distal aspect of the long distal tibiofibular syndesmotic screw. This is nonspecific and may presumably is from chronic loosening. Bone infection is felt less likely given the stability. 5. Unchanged to minimally improved shallow focal soft tissue ulceration within the superolateral ankle just superior to the head of the syndesmotic screw. Electronically Signed   By: Bertina Broccoli M.D.   On: 10/28/2023 19:49   DG Chest 2 View Result Date: 10/25/2023 CLINICAL DATA:  cough EXAM: CHEST - 2 VIEW COMPARISON:  Chest x-ray 10/20/2023 FINDINGS: The heart and mediastinal contours are unchanged. Atherosclerotic plaque No focal consolidation. No pulmonary edema. No pleural effusion. No pneumothorax. No acute osseous abnormality. Sternotomy wires are intact. Bilateral shoulder degenerative changes. IMPRESSION: 1. No active cardiopulmonary disease. 2.  Aortic Atherosclerosis (ICD10-I70.0). Electronically Signed   By: Morgane  Naveau M.D.   On: 10/25/2023 21:21   DG Ankle 2 Views Right Result Date: 10/23/2023 CLINICAL DATA:  Right ankle pain. EXAM: RIGHT ANKLE - 2 VIEW COMPARISON:  Radiograph 2 days ago. FINDINGS: Postsurgical and degenerative change of the ankle without significant interval change. Plate and screw fixation of the distal fibula traversing a remote distal fibular fracture extending to the hindfoot. Lucency about the proximal fibular plate is unchanged. Syndesmotic screw. Tibial talar fusion which appears solid. No acute fracture, erosive or bony destructive change.  IMPRESSION: Postsurgical and degenerative change of the ankle without significant interval change. Lucency about the proximal fibular plate is unchanged may represent loosening. Recommend orthopedic follow-up given persistent or  recurrent pain. Electronically Signed   By: Chadwick Colonel M.D.   On: 10/23/2023 17:34   DG Ankle Complete Right Result Date: 10/21/2023 CLINICAL DATA:  Pain. EXAM: RIGHT ANKLE - COMPLETE 3+ VIEW COMPARISON:  Radiograph 03/22/2023 FINDINGS: Ankle fusion with lateral plate and multi screw fixation traversing the distal fibula and hindfoot. Lucency about the upper aspect of the fibular plate of 1-2 mm. Chronic posterior posttraumatic and postsurgical of the distal tibia and fibula. No evidence of acute fracture, erosion or bony destructive change. There is a moderate plantar calcaneal spur. Generalized soft tissue edema. IMPRESSION: 1. Ankle fusion with lateral plate and multi screw fixation traversing the distal fibula and hindfoot. Lucency about the upper aspect of the fibular plate of 1-2 mm, may represent loosening. 2. Chronic remote posttraumatic and postsurgical deformity of the ankle, without significant interval change from October exam. 3. Generalized soft tissue edema. Electronically Signed   By: Chadwick Colonel M.D.   On: 10/21/2023 17:21   DG Chest 2 View Result Date: 10/20/2023 CLINICAL DATA:  Tachycardia with palpitations. EXAM: CHEST - 2 VIEW COMPARISON:  Radiographs 10/11/2023, 10/03/2023 and 08/06/2023. CT 06/26/2023. FINDINGS: The heart size and mediastinal contours are stable with mild cardiac enlargement post median sternotomy and CABG. The lungs are clear. There is no pleural effusion or pneumothorax. No acute osseous findings are demonstrated. There are stable mild degenerative changes at both shoulders. IMPRESSION: No evidence of acute cardiopulmonary process. Stable mild cardiac enlargement post CABG. Electronically Signed   By: Elmon Hagedorn M.D.   On: 10/20/2023 16:58   DG Chest Portable 1 View Result Date: 10/11/2023 CLINICAL DATA:  Cough. EXAM: PORTABLE CHEST 1 VIEW COMPARISON:  Chest radiograph dated 10/03/2023 and CT dated 06/26/2023. FINDINGS: No focal consolidation, pleural  effusion or pneumothorax. Mild cardiomegaly. Median sternotomy wires. No acute osseous pathology. IMPRESSION: 1. No active disease. 2. Mild cardiomegaly. Electronically Signed   By: Angus Bark M.D.   On: 10/11/2023 12:17    Microbiology: Recent Results (from the past 240 hours)  Resp panel by RT-PCR (RSV, Flu A&B, Covid) Anterior Nasal Swab     Status: None   Collection Time: 10/25/23 10:42 PM   Specimen: Anterior Nasal Swab  Result Value Ref Range Status   SARS Coronavirus 2 by RT PCR NEGATIVE NEGATIVE Final    Comment: (NOTE) SARS-CoV-2 target nucleic acids are NOT DETECTED.  The SARS-CoV-2 RNA is generally detectable in upper respiratory specimens during the acute phase of infection. The lowest concentration of SARS-CoV-2 viral copies this assay can detect is 138 copies/mL. A negative result does not preclude SARS-Cov-2 infection and should not be used as the sole basis for treatment or other patient management decisions. A negative result may occur with  improper specimen collection/handling, submission of specimen other than nasopharyngeal swab, presence of viral mutation(s) within the areas targeted by this assay, and inadequate number of viral copies(<138 copies/mL). A negative result must be combined with clinical observations, patient history, and epidemiological information. The expected result is Negative.  Fact Sheet for Patients:  BloggerCourse.com  Fact Sheet for Healthcare Providers:  SeriousBroker.it  This test is no t yet approved or cleared by the [place]  FDA and  has been authorized for detection and/or diagnosis of SARS-CoV-2 by FDA under an Emergency Use Authorization (EUA). This EUA will remain  in effect (meaning this test can be used) for the duration of the COVID-19 declaration under Section 564(b)(1) of the Act, 21 U.S.C.section 360bbb-3(b)(1), unless the authorization is terminated  or  revoked sooner.       Influenza A by PCR NEGATIVE NEGATIVE Final   Influenza B by PCR NEGATIVE NEGATIVE Final    Comment: (NOTE) The Xpert Xpress SARS-CoV-2/FLU/RSV plus assay is intended as an aid in the diagnosis of influenza from Nasopharyngeal swab specimens and should not be used as a sole basis for treatment. Nasal washings and aspirates are unacceptable for Xpert Xpress SARS-CoV-2/FLU/RSV testing.  Fact Sheet for Patients: BloggerCourse.com  Fact Sheet for Healthcare Providers: SeriousBroker.it  This test is not yet approved or cleared by the [place]  FDA and has been authorized for detection and/or diagnosis of SARS-CoV-2 by FDA under an Emergency Use Authorization (EUA). This EUA will remain in effect (meaning this test can be used) for the duration of the COVID-19 declaration under Section 564(b)(1) of the Act, 21 U.S.C. section 360bbb-3(b)(1), unless the authorization is terminated or revoked.     Resp Syncytial Virus by PCR NEGATIVE NEGATIVE Final    Comment: (NOTE) Fact Sheet for Patients: BloggerCourse.com  Fact Sheet for Healthcare Providers: SeriousBroker.it  This test is not yet approved or cleared by the [place]  FDA and has been authorized for detection and/or diagnosis of SARS-CoV-2 by FDA under an Emergency Use Authorization (EUA). This EUA will remain in effect (meaning this test can be used) for the duration of the COVID-19 declaration under Section 564(b)(1) of the Act, 21 U.S.C. section 360bbb-3(b)(1), unless the authorization is terminated or revoked.  Performed at [facility], [address], [place], [place] [address]   Blood culture (routine x 2)     Status: None   Collection Time: 10/26/23  5:25 PM   Specimen: BLOOD  Result Value Ref Range Status   Specimen Description   Final    BLOOD RIGHT  ANTECUBITAL Performed at [facility], [address] [place], [place] [address]    Special Requests   Final    BOTTLES DRAWN AEROBIC AND ANAEROBIC Blood Culture adequate volume Performed at [facility], [address], [place], [place] [address]    Culture   Final    NO GROWTH 5 DAYS Performed at [facility], [address] [place], [place] [address]    Report Status 10/31/2023 FINAL  Final  Blood culture (routine x 2)     Status: None   Collection Time: 10/26/23  5:30 PM   Specimen: BLOOD RIGHT FOREARM  Result Value Ref Range Status   Specimen Description   Final    BLOOD RIGHT FOREARM Performed at [facility], [address] [place], [place] [address]    Special Requests   Final    BOTTLES DRAWN AEROBIC AND ANAEROBIC Blood Culture results may not be optimal due to an inadequate volume of blood received in culture bottles Performed at [facility], [address] [place], [place] [address]    Culture   Final    NO GROWTH 5 DAYS Performed at [facility], [address] [address], [place], [place] [address]    Report Status 10/31/2023 FINAL  Final  Urine Culture     Status: Abnormal   Collection Time: 10/26/23  6:50 PM   Specimen: Urine, Random  Result Value Ref Range Status   Specimen Description   Final  URINE, RANDOM Performed at [facility], [address], [place], [place] [address]    Special Requests   Final    NONE Reflexed from [id number] Performed at [facility], [address], [place], [place] [address]    Culture (A)  Final    >=100,000 COLONIES/mL ESCHERICHIA COLI Confirmed Extended Spectrum Beta-Lactamase Producer (ESBL).  In bloodstream infections from ESBL organisms, carbapenems are preferred over piperacillin /tazobactam. They are shown to have a lower risk of mortality.    Report Status 10/28/2023 FINAL  Final   Organism ID, Bacteria  ESCHERICHIA COLI (A)  Final      Susceptibility   Escherichia coli - MIC*    AMPICILLIN >=32 RESISTANT Resistant     CEFAZOLIN  >=64 RESISTANT Resistant     CEFEPIME  16 RESISTANT Resistant     CEFTRIAXONE >=64 RESISTANT Resistant     CIPROFLOXACIN  >=4 RESISTANT Resistant     GENTAMICIN <=1 SENSITIVE Sensitive     IMIPENEM <=0.25 SENSITIVE Sensitive     NITROFURANTOIN 128 RESISTANT Resistant     TRIMETH /SULFA  <=20 SENSITIVE Sensitive     AMPICILLIN/SULBACTAM >=32 RESISTANT Resistant     PIP/TAZO 8 SENSITIVE Sensitive ug/mL    * >=100,000 COLONIES/mL ESCHERICHIA COLI     Labs: Basic Metabolic Panel: Recent Labs  Lab 10/31/23 0319 11/01/23 0332 11/02/23 0338 11/03/23 0403 11/04/23 0857  NA 136 138 135 136 136  K 4.3 4.5 4.1 4.3 4.4  CL 105 108 107 110 109  CO2 22 20* 20* 18* 20*  GLUCOSE 143* 104* 121* 105* 115*  BUN 29* 30* 29* 32* 30*  CREATININE 1.97* 1.84* 1.84* 1.70* 1.65*  CALCIUM  9.7 9.6 9.5 9.5 9.5  MG 2.2 2.2 2.2 2.0 2.2   Liver Function Tests: No results for input(s): "AST", "ALT", "ALKPHOS", "BILITOT", "PROT", "ALBUMIN" in the last 168 hours. No results for input(s): "LIPASE", "AMYLASE" in the last 168 hours. No results for input(s): "AMMONIA" in the last 168 hours. CBC: Recent Labs  Lab 10/31/23 0319 11/02/23 0338  WBC 5.3 5.8  NEUTROABS 2.0 2.0  HGB 13.7 13.0  HCT 44.1 40.4  MCV 97.6 95.3  PLT 322 347   Cardiac Enzymes: No results for input(s): "CKTOTAL", "CKMB", "CKMBINDEX", "TROPONINI" in the last 168 hours. BNP: BNP (last 3 results) Recent Labs    12/11/22 1213 12/28/22 1443  BNP 92.0 9.8    ProBNP (last 3 results) No results for input(s): "PROBNP" in the last 8760 hours.  CBG: Recent Labs  Lab 11/03/23 1115 11/03/23 1721 11/03/23 2134 11/04/23 0729 11/04/23 1125  GLUCAP 131* 130* 126* 94 126*       Signed:  Hilda Lovings MD.  [facility] 11/04/2023, 12:14 PM

## 2023-11-04 NOTE — Progress Notes (Signed)
 Jeremy [name] and report given to nurse room.

## 2023-11-04 NOTE — Plan of Care (Signed)
  Problem: Skin Integrity: Goal: Risk for impaired skin integrity will decrease Outcome: Progressing   Problem: Activity: Goal: Risk for activity intolerance will decrease Outcome: Progressing   Problem: Safety: Goal: Ability to remain free from injury will improve Outcome: Progressing   Problem: Pain Managment: Goal: General experience of comfort will improve and/or be controlled Outcome: Progressing

## 2023-11-04 NOTE — TOC Transition Note (Signed)
 Transition of Care [facility]) - Discharge Note   Patient Details  Name: Jeremy [name] MRN: [id number] Date of Birth: August 11, 1955  [facility]) CM/SW Contact:  Tessie Fila, RN Phone Number: 11/04/2023, 1:40 PM   Clinical Narrative:    Pt discharging to [facility] today. Pt is aware and agreeable to the discharge plan. Spoke with Vanna Genta, pt nephew at [phone] and he is aware of pt being transported to [facility]. [facility] called at [phone]. DC packet placed at nurse's station with signed script inside. The nurse was given the number to call report at [phone], ext [phone] for room 309. There are no other TOC needs at this time.   Final next level of care: Skilled Nursing Facility Barriers to Discharge: No Barriers Identified   Patient Goals and CMS Choice Patient states their goals for this hospitalization and ongoing recovery are:: To go to SNF for STR CMS Medicare.gov Compare Post Acute Care list provided to:: Other (Comment Required) (NA) Choice offered to / list presented to : NA [place] ownership interest in [facility].provided to:: Parent NA    Discharge Placement              Patient chooses bed at: [facility] Patient to be transferred to facility by: [facility] Name of family member notified: Monika Annas [facility])  [phone] Patient and family notified of of transfer: 11/04/23  Discharge Plan and Services Additional resources added to the After Visit Summary for                  DME Arranged: N/A DME Agency: NA       HH Arranged: NA HH Agency: NA        Social Drivers of Health (SDOH) Interventions SDOH Screenings   Food Insecurity: No Food Insecurity (10/27/2023)  Housing: Low Risk  (10/27/2023)  Transportation Needs: No Transportation Needs (10/27/2023)  Utilities: Not At Risk (10/27/2023)  Depression (PHQ2-9): Low Risk  (10/30/2018)  Financial Resource Strain: Low Risk  (06/03/2023)   Received from [facility]  Recent Concern: Financial Resource Strain - High Risk (03/22/2023)   Received from [facility]  Social Connections: Patient Declined (10/27/2023)  Tobacco Use: High Risk (10/26/2023)     Readmission Risk Interventions    10/28/2023   10:12 AM 08/09/2023    2:42 PM  Readmission Risk Prevention Plan  Transportation Screening Complete Complete  Medication Review (RN Care Manager) Complete   PCP or Specialist appointment within 3-5 days of discharge Complete   HRI or Home Care Consult Complete --  SW Recovery Care/Counseling Consult Complete   Palliative Care Screening Not Applicable Not Applicable  Skilled Nursing Facility Complete Not Applicable

## 2023-11-18 ENCOUNTER — Other Ambulatory Visit: Payer: Self-pay

## 2023-11-18 MED ORDER — ENTRESTO 24-26 MG PO TABS
1.0000 | ORAL_TABLET | Freq: Two times a day (BID) | ORAL | 3 refills | Status: DC
  Filled 2023-11-18: qty 180, 90d supply, fill #0

## 2023-11-30 ENCOUNTER — Other Ambulatory Visit: Payer: Self-pay

## 2023-12-02 ENCOUNTER — Emergency Department (HOSPITAL_COMMUNITY)
Admission: EM | Admit: 2023-12-02 | Discharge: 2023-12-02 | Disposition: A | Discharge status: Home / Self Care | Attending: Emergency Medicine | Admitting: Emergency Medicine

## 2023-12-02 ENCOUNTER — Encounter (HOSPITAL_COMMUNITY): Payer: Self-pay

## 2023-12-02 ENCOUNTER — Other Ambulatory Visit: Payer: Self-pay

## 2023-12-02 ENCOUNTER — Emergency Department (HOSPITAL_COMMUNITY)

## 2023-12-02 DIAGNOSIS — I110 Hypertensive heart disease with heart failure: Secondary | ICD-10-CM | POA: Insufficient documentation

## 2023-12-02 DIAGNOSIS — M25571 Pain in right ankle and joints of right foot: Secondary | ICD-10-CM | POA: Diagnosis present

## 2023-12-02 DIAGNOSIS — G8929 Other chronic pain: Secondary | ICD-10-CM | POA: Insufficient documentation

## 2023-12-02 DIAGNOSIS — J449 Chronic obstructive pulmonary disease, unspecified: Secondary | ICD-10-CM | POA: Insufficient documentation

## 2023-12-02 DIAGNOSIS — Z7982 Long term (current) use of aspirin: Secondary | ICD-10-CM | POA: Diagnosis not present

## 2023-12-02 DIAGNOSIS — I509 Heart failure, unspecified: Secondary | ICD-10-CM | POA: Insufficient documentation

## 2023-12-02 DIAGNOSIS — Z79899 Other long term (current) drug therapy: Secondary | ICD-10-CM | POA: Insufficient documentation

## 2023-12-02 LAB — CBC WITH DIFFERENTIAL/PLATELET
Abs Immature Granulocytes: 0.01 10*3/uL (ref 0.00–0.07)
Basophils Absolute: 0.1 10*3/uL (ref 0.0–0.1)
Basophils Relative: 1 %
Eosinophils Absolute: 0.3 10*3/uL (ref 0.0–0.5)
Eosinophils Relative: 5 %
HCT: 41.6 % (ref 39.0–52.0)
Hemoglobin: 13.3 g/dL (ref 13.0–17.0)
Immature Granulocytes: 0 %
Lymphocytes Relative: 42 %
Lymphs Abs: 2.1 10*3/uL (ref 0.7–4.0)
MCH: 30.6 pg (ref 26.0–34.0)
MCHC: 32 g/dL (ref 30.0–36.0)
MCV: 95.6 fL (ref 80.0–100.0)
Monocytes Absolute: 0.4 10*3/uL (ref 0.1–1.0)
Monocytes Relative: 8 %
Neutro Abs: 2.1 10*3/uL (ref 1.7–7.7)
Neutrophils Relative %: 44 %
Platelets: 307 10*3/uL (ref 150–400)
RBC: 4.35 MIL/uL (ref 4.22–5.81)
RDW: 14.9 % (ref 11.5–15.5)
WBC: 4.9 10*3/uL (ref 4.0–10.5)
nRBC: 0 % (ref 0.0–0.2)

## 2023-12-02 LAB — COMPREHENSIVE METABOLIC PANEL WITH GFR
ALT: 10 U/L (ref 0–44)
AST: 16 U/L (ref 15–41)
Albumin: 3.8 g/dL (ref 3.5–5.0)
Alkaline Phosphatase: 67 U/L (ref 38–126)
Anion gap: 11 (ref 5–15)
BUN: 23 mg/dL (ref 8–23)
CO2: 20 mmol/L — ABNORMAL LOW (ref 22–32)
Calcium: 9.2 mg/dL (ref 8.9–10.3)
Chloride: 107 mmol/L (ref 98–111)
Creatinine, Ser: 1.6 mg/dL — ABNORMAL HIGH (ref 0.61–1.24)
GFR, Estimated: 47 mL/min — ABNORMAL LOW (ref >60)
Glucose, Bld: 206 mg/dL — ABNORMAL HIGH (ref 70–99)
Potassium: 4.3 mmol/L (ref 3.5–5.1)
Sodium: 138 mmol/L (ref 135–145)
Total Bilirubin: 0.3 mg/dL (ref 0.0–1.2)
Total Protein: 7.8 g/dL (ref 6.5–8.1)

## 2023-12-02 MED ORDER — PREDNISONE 50 MG PO TABS
50.0000 mg | ORAL_TABLET | Freq: Once | ORAL | Status: AC
  Administered 2023-12-02: 50 mg via ORAL
  Filled 2023-12-02: qty 1

## 2023-12-02 NOTE — ED Triage Notes (Signed)
 Patient presented to ER with R ankle pain, has had ankle issue for 3 years but increase in swelling and pain today.

## 2023-12-02 NOTE — ED Provider Notes (Cosign Needed)
 [place] EMERGENCY DEPARTMENT AT [facility] Provider Note   CSN: [id number] Arrival date & time: 12/02/23  1211     Patient presents with: Ankle Pain   Jeremy [name] is a 68 y.o. male who has a primary concern of of right ankle pain has been chronic for years.  Had previous surgery noted and there is noted multiple ED visits for the same.  He states that is difficult for him to ambulate given the pain and edema to the right ankle.  He further endorses polyuria, stating that often he cannot control his urine due to increased urgency.  He notes that at present he does not have a home, was previously at an SNF however is not currently residing at [facility].  Not receiving any of his current home medications.  Of note this patient has previous diagnoses of status post ORIF of the right ankle, essential hypertension, NSTEMI, HFrEF, paroxysmal atrial fibrillation, obstructive sleep apnea, hyperlipidemia, COPD.  Again he has not been taking any prescribed medications for any of his medical conditions at present.    Ankle Pain      Prior to Admission medications   Medication Sig Start Date End Date Taking? Authorizing Provider  acetaminophen  (TYLENOL ) 500 MG tablet Take 1 tablet (500 mg total) by mouth every 6 (six) hours as needed. Patient not taking: Reported on 10/27/2023 10/23/23   Afton Horse T, DO  amiodarone  (PACERONE ) 200 MG tablet Take 1 tablet (200 mg total) by mouth daily. 10/20/23 01/19/24  Kandee Orion, MD  aspirin  81 MG chewable tablet Chew 1 tablet (81 mg total) by mouth daily. 10/20/23 02/17/24  Kandee Orion, MD  atorvastatin  (LIPITOR) 40 MG tablet Take 1 tablet (40 mg total) by mouth daily. 10/20/23   Kandee Orion, MD  carvedilol  (COREG ) 3.125 MG tablet Take 1 tablet (3.125 mg total) by mouth 2 (two) times daily. Reduced from 6.25 mg. 10/20/23 01/19/24  Kandee Orion, MD  metFORMIN  (GLUCOPHAGE ) 1000 MG tablet Take 1 tablet (1,000 mg total) by mouth once daily with  breakfast. 10/20/23 01/19/24  Kandee Orion, MD  nitroGLYCERIN  (NITROSTAT ) 0.4 MG SL tablet Place 1 tablet (0.4 mg total) under the tongue every 5 (five) minutes as needed for chest pain. 11/26/22   Alexander, Natalie, DO  pantoprazole  (PROTONIX ) 20 MG tablet Take 1 tablet (20 mg total) by mouth daily. 06/28/23   Alexander, Natalie, DO  polyethylene glycol (MIRALAX  / GLYCOLAX ) 17 g packet Take 17 g by mouth daily as needed for moderate constipation. 11/04/23   Armenta Landau, MD  rivaroxaban  (XARELTO ) 20 MG TABS tablet Take 1 tablet (20 mg total) by mouth daily at 5 pm 10/20/23 01/19/24  Kandee Orion, MD  sacubitril -valsartan  (ENTRESTO ) 24-26 MG Take 1 tablet by mouth every 12 (twelve) hours. 11/18/23     senna-docusate (SENOKOT-S) 8.6-50 MG tablet Take 1 tablet by mouth 2 (two) times daily. 11/04/23   Armenta Landau, MD  traMADol  (ULTRAM ) 50 MG tablet Take 1 tablet (50 mg total) by mouth every 6 (six) hours as needed. 11/04/23 11/03/24  Armenta Landau, MD    Allergies: Patient has no known allergies.    Review of Systems  Genitourinary:  Positive for frequency and urgency. Negative for difficulty urinating and dysuria.  Musculoskeletal:  Positive for arthralgias and joint swelling.  All other systems reviewed and are negative.   Updated Vital Signs BP 136/79 (BP Location: Right Arm)   Pulse (!) 108   Temp  98 F (36.7 C) (Oral)   Resp 18   Ht 5' 11 (1.803 m)   Wt 100 kg   SpO2 98%   BMI 30.75 kg/m   Physical Exam Vitals and nursing note reviewed.  Constitutional:      General: He is not in acute distress.    Appearance: Normal appearance. He is well-developed.  HENT:     Head: Normocephalic and atraumatic.   Eyes:     Conjunctiva/sclera: Conjunctivae normal.    Cardiovascular:     Rate and Rhythm: Normal rate and regular rhythm.     Heart sounds: No murmur heard. Pulmonary:     Effort: Pulmonary effort is normal. No respiratory distress.     Breath sounds: Normal breath  sounds.  Abdominal:     Palpations: Abdomen is soft.     Tenderness: There is no abdominal tenderness.   Musculoskeletal:        General: Swelling and tenderness present. No deformity.     Cervical back: Neck supple.     Right lower leg: Swelling and tenderness present. No deformity, lacerations or bony tenderness.     Left lower leg: Normal.     Comments: Right ankle is edematous, do note +2 pulses to the dorsalis pedis, unable to appreciate posterior tibialis due to edema.  There is a healed postsurgical wound to the lateral right ankle.  No erythema or induration noted, mild tenderness to palpation of the medial ankle.   Skin:    General: Skin is warm and dry.     Capillary Refill: Capillary refill takes less than 2 seconds.   Neurological:     Mental Status: He is alert and oriented to person, place, and time.     GCS: GCS eye subscore is 4. GCS verbal subscore is 5. GCS motor subscore is 6.   Psychiatric:        Mood and Affect: Mood normal.        Behavior: Behavior is cooperative.     (all labs ordered are listed, but only abnormal results are displayed) Labs Reviewed  COMPREHENSIVE METABOLIC PANEL WITH GFR - Abnormal; Notable for the following components:      Result Value   CO2 20 (*)    Glucose, Bld 206 (*)    Creatinine, Ser 1.60 (*)    GFR, Estimated 47 (*)    All other components within normal limits  CBC WITH DIFFERENTIAL/PLATELET  URINALYSIS, W/ REFLEX TO CULTURE (INFECTION SUSPECTED)    EKG: None  Radiology: DG Ankle Complete Right Result Date: 12/02/2023 CLINICAL DATA:  Right ankle swelling.  Pain. EXAM: RIGHT ANKLE - COMPLETE 3+ VIEW COMPARISON:  Right ankle radiographs 10/23/2023, and 03/22/2023; CT right ankle 10/28/2023 FINDINGS: There is again a long distal fibular plate traversing a displaced remote distal fibular diaphyseal fracture. Associated distal tibiofibular syndesmosis screw. Hardware alignment is unchanged. There is again no purchase of the  distal screw in the lateral talus, as seen on prior radiographs and CT. Unchanged one bone width displacement of the fracture with chronic nonunion better seen on prior CT. Tibiotalar solid fusion again seen. Moderate plantar calcaneal heel spur. Moderate dorsal midfoot degenerative spurring, unchanged. No acute fracture. IMPRESSION: 1. Unchanged alignment of distal fibular plate traversing a displaced remote distal fibular diaphyseal fracture. 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 2. Unchanged solid tibiotalar fusion. 3. Moderate plantar calcaneal heel spur. Electronically Signed   By: Bertina Broccoli M.D.   On: 12/02/2023 14:34  Procedures   Medications Ordered in the ED - No data to display  Clinical Course as of 12/02/23 1717  Fri Dec 02, 2023  1400 Creatinine(!): 1.60 Reviewed, consistent with previous readings from previous ED visits.   [JG]  1401 Glucose(!): 206 Noted, consistent with previous visits. [JG]  1401 DG Ankle Complete Right Noted results, reviewed x-ray and do not find any acute changes. [JG]    Clinical Course User Index [JG] Juanetta Nordmann, PA                                 Medical Decision Making Amount and/or Complexity of Data Reviewed Labs: ordered. Decision-making details documented in ED Course. Radiology: ordered. Decision-making details documented in ED Course.   Medical Decision Making:   Kamauri Denardo is a 68 y.o. male who presented to the ED today with right ankle swelling and pain is due to chronic injury detailed above.     Complete initial physical exam performed, notably the patient  was alert and oriented in no apparent distress, noted to have right ankle swelling and pain that is chronic as noted in extensive previous visit history.  Reviewed imaging did not see any acute changes needing acute management. .    Reviewed and confirmed nursing documentation for past medical history, family history, social history.     Initial Assessment:   With the patient's presentation of right ankle pain as well as increased urinary frequency, most likely diagnosis is chronic postsurgical pain, polyuria secondary to hyperglycemia.. Other diagnoses were considered including (but not limited to) UTI, gout/pseudogout. These are considered less likely due to history of present illness and physical exam findings.     Initial Plan:  Imaging of the right ankle to evaluate progress of swelling, chronic injury Screening labs including CBC and Metabolic panel to evaluate for infectious or metabolic etiology of disease.  This is secondary to the fact that he has been off of regularly scheduled medications for an undetermined amount of time. Urinalysis with reflex culture ordered to evaluate for UTI or relevant urologic/nephrologic pathology.  Objective evaluation as below reviewed   Initial Study Results:   Laboratory  All laboratory results reviewed without evidence of clinically relevant pathology.   Exceptions include: None  Radiology:  All images reviewed independently. Agree with radiology report at this time.   DG Ankle Complete Right Result Date: 12/02/2023 CLINICAL DATA:  Right ankle swelling.  Pain. EXAM: RIGHT ANKLE - COMPLETE 3+ VIEW COMPARISON:  Right ankle radiographs 10/23/2023, and 03/22/2023; CT right ankle 10/28/2023 FINDINGS: There is again a long distal fibular plate traversing a displaced remote distal fibular diaphyseal fracture. Associated distal tibiofibular syndesmosis screw. Hardware alignment is unchanged. There is again no purchase of the distal screw in the lateral talus, as seen on prior radiographs and CT. Unchanged one bone width displacement of the fracture with chronic nonunion better seen on prior CT. Tibiotalar solid fusion again seen. Moderate plantar calcaneal heel spur. Moderate dorsal midfoot degenerative spurring, unchanged. No acute fracture. IMPRESSION: 1. Unchanged alignment of distal  fibular plate traversing a displaced remote distal fibular diaphyseal fracture. 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 2. Unchanged solid tibiotalar fusion. 3. Moderate plantar calcaneal heel spur. Electronically Signed   By: Bertina Broccoli M.D.   On: 12/02/2023 14:34    Reassessment and Plan:   Patient assessment this patient, do not see any acute bony changes as evidenced  by x-ray imaging of the ankle.  At this time we will manage his pain with a dose of prednisone .  Discussed that patient will need continued follow-up with his PCP, he does not have 1 and is currently homeless and has no way to get continuing therapy.  As such we will discharge with single dose of prednisone  and referral to community clinic for following care.       Final diagnoses:  None    ED Discharge Orders     None          Juanetta Nordmann, [place] 12/02/23 1718

## 2023-12-02 NOTE — ED Notes (Signed)
 Patient given a urinal and asked to provide a urine sample. Patient tried but he is unable to urinate at this time.

## 2023-12-05 ENCOUNTER — Other Ambulatory Visit: Payer: Self-pay

## 2023-12-05 ENCOUNTER — Emergency Department (HOSPITAL_COMMUNITY)
Admission: EM | Admit: 2023-12-05 | Discharge: 2023-12-05 | Disposition: A | Discharge status: Home / Self Care | Attending: Emergency Medicine | Admitting: Emergency Medicine

## 2023-12-05 ENCOUNTER — Emergency Department (HOSPITAL_COMMUNITY)

## 2023-12-05 ENCOUNTER — Encounter (HOSPITAL_COMMUNITY): Payer: Self-pay | Admitting: *Deleted

## 2023-12-05 DIAGNOSIS — M25571 Pain in right ankle and joints of right foot: Secondary | ICD-10-CM | POA: Insufficient documentation

## 2023-12-05 DIAGNOSIS — G8929 Other chronic pain: Secondary | ICD-10-CM | POA: Diagnosis not present

## 2023-12-05 DIAGNOSIS — Z7982 Long term (current) use of aspirin: Secondary | ICD-10-CM | POA: Insufficient documentation

## 2023-12-05 MED ORDER — ACETAMINOPHEN 500 MG PO TABS
1000.0000 mg | ORAL_TABLET | Freq: Once | ORAL | Status: AC
Start: 2023-12-05 — End: 2023-12-05
  Administered 2023-12-05: 1000 mg via ORAL
  Filled 2023-12-05: qty 2

## 2023-12-05 NOTE — ED Triage Notes (Addendum)
 [place] [facility] [place] and [place]. Describes as throbbing. Ongoing > 1 week. Acute/chronic. Describes as on and off, worse than usual over last 2d. Denies injury. Seen previously for the same in ED 6/20.VSS. 140/90, HR 120, RR 18, SPO2 RA 94%. CBG 125. Honeless, alert, NAD, calm, interactive. Asking for coffee. Able to bear weight from stretcher to w/c.

## 2023-12-05 NOTE — Discharge Instructions (Addendum)
 About excision today revealed that your likely having chronic pain in your right ankle.  Please follow-up with your PCP.  You can take Tylenol  at home.  Also recommend ice. If you have any new trauma or notice worsening swelling, redness or pustulant discharge from your ankle please return to the ED for further evaluation.

## 2023-12-05 NOTE — ED Provider Notes (Signed)
 [place] EMERGENCY DEPARTMENT AT [facility] Provider Note   CSN: [id number] Arrival date & time: 12/05/23  9094     Patient presents with: Ankle Pain  HPI Jeremy [name] is a 68 y.o. male presenting for right ankle pain.  He has had chronic pain in this ankle for several years.  Remote history of right ankle surgery.  Endorsing pain primarily on the lateral aspect of the ankle joint.  Denies any new trauma.  It is worse with walking.  Has been going on for over a week now.  Denies any pain radiating into the back of the leg or the calf.  Endorses some's swelling around the ankle.  Patient is also homeless and requesting coffee, chart exam which and pain medicine.    Ankle Pain      Prior to Admission medications   Medication Sig Start Date End Date Taking? Authorizing Provider  acetaminophen  (TYLENOL ) 500 MG tablet Take 1 tablet (500 mg total) by mouth every 6 (six) hours as needed. Patient not taking: Reported on 10/27/2023 10/23/23   Mannie Pac T, DO  amiodarone  (PACERONE ) 200 MG tablet Take 1 tablet (200 mg total) by mouth daily. 10/20/23 01/19/24  Malvina Alm DASEN, MD  aspirin  81 MG chewable tablet Chew 1 tablet (81 mg total) by mouth daily. 10/20/23 02/17/24  Malvina Alm DASEN, MD  atorvastatin  (LIPITOR) 40 MG tablet Take 1 tablet (40 mg total) by mouth daily. 10/20/23   Malvina Alm DASEN, MD  carvedilol  (COREG ) 3.125 MG tablet Take 1 tablet (3.125 mg total) by mouth 2 (two) times daily. Reduced from 6.25 mg. 10/20/23 01/19/24  Malvina Alm DASEN, MD  metFORMIN  (GLUCOPHAGE ) 1000 MG tablet Take 1 tablet (1,000 mg total) by mouth once daily with breakfast. 10/20/23 01/19/24  Malvina Alm DASEN, MD  nitroGLYCERIN  (NITROSTAT ) 0.4 MG SL tablet Place 1 tablet (0.4 mg total) under the tongue every 5 (five) minutes as needed for chest pain. 11/26/22   Alexander, Natalie, DO  pantoprazole  (PROTONIX ) 20 MG tablet Take 1 tablet (20 mg total) by mouth daily. 06/28/23   Alexander, Natalie, DO  polyethylene  glycol (MIRALAX  / GLYCOLAX ) 17 g packet Take 17 g by mouth daily as needed for moderate constipation. 11/04/23   Sebastian Toribio GAILS, MD  rivaroxaban  (XARELTO ) 20 MG TABS tablet Take 1 tablet (20 mg total) by mouth daily at 5 pm 10/20/23 01/19/24  Malvina Alm DASEN, MD  sacubitril -valsartan  (ENTRESTO ) 24-26 MG Take 1 tablet by mouth every 12 (twelve) hours. 11/18/23     senna-docusate (SENOKOT-S) 8.6-50 MG tablet Take 1 tablet by mouth 2 (two) times daily. 11/04/23   Sebastian Toribio GAILS, MD  traMADol  (ULTRAM ) 50 MG tablet Take 1 tablet (50 mg total) by mouth every 6 (six) hours as needed. 11/04/23 11/03/24  Sebastian Toribio GAILS, MD    Allergies: Patient has no known allergies.    Review of Systems See HPI  Updated Vital Signs BP (!) 149/103   Pulse 100   Temp 97.9 F (36.6 C)   Resp 17   Ht 5' 11 (1.803 m)   Wt 99.8 kg   SpO2 96%   BMI 30.68 kg/m   Physical Exam Constitutional:      Appearance: Normal appearance.  HENT:     Head: Normocephalic.     Nose: Nose normal.   Eyes:     Conjunctiva/sclera: Conjunctivae normal.   Pulmonary:     Effort: Pulmonary effort is normal.   Musculoskeletal:     Right  ankle: No swelling, deformity, ecchymosis or lacerations. Tenderness present. Normal range of motion. Normal pulse.     Left ankle: Normal. Normal pulse.     Comments: And generalized tenderness around the right ankle.  No appreciable swelling or erythema noted in the ankle or up the calf.   Neurological:     Mental Status: He is alert.   Psychiatric:        Mood and Affect: Mood normal.      (all labs ordered are listed, but only abnormal results are displayed) Labs Reviewed - No data to display  EKG: None  Radiology: DG Ankle Complete Right Result Date: 12/05/2023 CLINICAL DATA:  Pain. EXAM: RIGHT ANKLE - COMPLETE 3+ VIEW COMPARISON:  12/02/2023 and 10/28/2023. FINDINGS: Again seen is a long distal fibular plate traversing a remote displaced distal fibular diaphyseal fracture.  Associated distal tibia fibular syndesmosis screw. Hardware alignment is unchanged. Again no purchase of the distal screw in the lateral talus, as seen on the ankle CT from 10/28/2023. Stable appearance of one bone with displacement of the chronic distal fibular fracture with none union, better seen on the recent CT. Tibiotalar solid fusion is again noted. Moderate plantar calcaneal heel spur. Moderate dorsal midfoot degenerative spurring. Mild diffuse soft tissue edema. IMPRESSION: 1. No change from prior exam. 2. 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 3. Unchanged solid tibiotalar fusion. 4. Mild diffuse soft tissue edema. Electronically Signed   By: Waddell Calk M.D.   On: 12/05/2023 11:50     Procedures   Medications Ordered in the ED  acetaminophen  (TYLENOL ) tablet 1,000 mg (1,000 mg Oral Given 12/05/23 1023)                                    Medical Decision Making Amount and/or Complexity of Data Reviewed Radiology: ordered.  Risk OTC drugs.   68 year old well-appearing male presenting for right ankle pain.  Exam notable for generalized tenderness but otherwise reassuring.  Does not appear to be infected.  Was also evaluated 3 days ago for same and had a reassuring workup.  X-ray today did not reveal any acute findings and he denies any new trauma to the ankle.  Suspect this is chronic ankle pain.  Advised continued supportive treatment at home and PCP follow-up.  We also provided him a sandwich, coffee and some water .  Discharged good condition.     Final diagnoses:  Chronic pain of right ankle    ED Discharge Orders     None          Jaretzy Lhommedieu K, PA-C 12/05/23 1252    Dreama Longs, MD 12/05/23 2315

## 2023-12-06 ENCOUNTER — Other Ambulatory Visit: Payer: Self-pay

## 2023-12-06 ENCOUNTER — Emergency Department (HOSPITAL_COMMUNITY)
Admission: EM | Admit: 2023-12-06 | Discharge: 2023-12-06 | Disposition: A | Discharge status: Home / Self Care | Attending: Emergency Medicine | Admitting: Emergency Medicine

## 2023-12-06 DIAGNOSIS — I110 Hypertensive heart disease with heart failure: Secondary | ICD-10-CM | POA: Diagnosis not present

## 2023-12-06 DIAGNOSIS — Z79899 Other long term (current) drug therapy: Secondary | ICD-10-CM | POA: Insufficient documentation

## 2023-12-06 DIAGNOSIS — M25571 Pain in right ankle and joints of right foot: Secondary | ICD-10-CM | POA: Insufficient documentation

## 2023-12-06 DIAGNOSIS — I2510 Atherosclerotic heart disease of native coronary artery without angina pectoris: Secondary | ICD-10-CM | POA: Diagnosis not present

## 2023-12-06 DIAGNOSIS — Z7982 Long term (current) use of aspirin: Secondary | ICD-10-CM | POA: Insufficient documentation

## 2023-12-06 DIAGNOSIS — Z7901 Long term (current) use of anticoagulants: Secondary | ICD-10-CM | POA: Diagnosis not present

## 2023-12-06 DIAGNOSIS — Z5900 Homelessness unspecified: Secondary | ICD-10-CM | POA: Insufficient documentation

## 2023-12-06 DIAGNOSIS — I509 Heart failure, unspecified: Secondary | ICD-10-CM | POA: Diagnosis not present

## 2023-12-06 DIAGNOSIS — Z7984 Long term (current) use of oral hypoglycemic drugs: Secondary | ICD-10-CM | POA: Diagnosis not present

## 2023-12-06 DIAGNOSIS — Z720 Tobacco use: Secondary | ICD-10-CM | POA: Insufficient documentation

## 2023-12-06 DIAGNOSIS — G8929 Other chronic pain: Secondary | ICD-10-CM | POA: Diagnosis not present

## 2023-12-06 MED ORDER — ACETAMINOPHEN 500 MG PO TABS
1000.0000 mg | ORAL_TABLET | Freq: Once | ORAL | Status: AC
  Administered 2023-12-06: 1000 mg via ORAL
  Filled 2023-12-06: qty 2

## 2023-12-06 NOTE — Discharge Instructions (Signed)
 Continue Tylenol  for pain.  Return to the emergency department if your symptoms worsen.  I have provided you with the contact information for [facility], you can contact them to establish care for management of your chronic conditions.

## 2023-12-06 NOTE — ED Provider Notes (Signed)
 [place] EMERGENCY DEPARTMENT AT [facility] Provider Note   CSN: [id number] Arrival date & time: 12/06/23  1203     Patient presents with: No chief complaint on file.   Jeremy [name] is a 68 y.o. male.   68 year old male presenting with right ankle pain.  Patient was seen in the emergency department yesterday for same, reassuring workup at that time including x-ray imaging.  Patient is homeless and reports he does not have a local primary care provider to follow-up with.  He denies any other symptoms.       Prior to Admission medications   Medication Sig Start Date End Date Taking? Authorizing Provider  acetaminophen  (TYLENOL ) 500 MG tablet Take 1 tablet (500 mg total) by mouth every 6 (six) hours as needed. Patient not taking: Reported on 10/27/2023 10/23/23   Mannie Pac T, DO  amiodarone  (PACERONE ) 200 MG tablet Take 1 tablet (200 mg total) by mouth daily. 10/20/23 01/19/24  Malvina Alm DASEN, MD  aspirin  81 MG chewable tablet Chew 1 tablet (81 mg total) by mouth daily. 10/20/23 02/17/24  Malvina Alm DASEN, MD  atorvastatin  (LIPITOR) 40 MG tablet Take 1 tablet (40 mg total) by mouth daily. 10/20/23   Malvina Alm DASEN, MD  carvedilol  (COREG ) 3.125 MG tablet Take 1 tablet (3.125 mg total) by mouth 2 (two) times daily. Reduced from 6.25 mg. 10/20/23 01/19/24  Malvina Alm DASEN, MD  metFORMIN  (GLUCOPHAGE ) 1000 MG tablet Take 1 tablet (1,000 mg total) by mouth once daily with breakfast. 10/20/23 01/19/24  Malvina Alm DASEN, MD  nitroGLYCERIN  (NITROSTAT ) 0.4 MG SL tablet Place 1 tablet (0.4 mg total) under the tongue every 5 (five) minutes as needed for chest pain. 11/26/22   Alexander, Natalie, DO  pantoprazole  (PROTONIX ) 20 MG tablet Take 1 tablet (20 mg total) by mouth daily. 06/28/23   Alexander, Natalie, DO  polyethylene glycol (MIRALAX  / GLYCOLAX ) 17 g packet Take 17 g by mouth daily as needed for moderate constipation. 11/04/23   Sebastian Toribio GAILS, MD  rivaroxaban  (XARELTO ) 20 MG TABS tablet Take  1 tablet (20 mg total) by mouth daily at 5 pm 10/20/23 01/19/24  Malvina Alm DASEN, MD  sacubitril -valsartan  (ENTRESTO ) 24-26 MG Take 1 tablet by mouth every 12 (twelve) hours. 11/18/23     senna-docusate (SENOKOT-S) 8.6-50 MG tablet Take 1 tablet by mouth 2 (two) times daily. 11/04/23   Sebastian Toribio GAILS, MD  traMADol  (ULTRAM ) 50 MG tablet Take 1 tablet (50 mg total) by mouth every 6 (six) hours as needed. 11/04/23 11/03/24  Sebastian Toribio GAILS, MD    Allergies: Patient has no known allergies.    Review of Systems  Updated Vital Signs BP (!) 157/100   Pulse 62   Temp 98 F (36.7 C) (Oral)   Resp 18   SpO2 100%   Physical Exam Vitals and nursing note reviewed.  HENT:     Head: Normocephalic.   Eyes:     Extraocular Movements: Extraocular movements intact.    Cardiovascular:     Rate and Rhythm: Normal rate.  Pulmonary:     Effort: Pulmonary effort is normal.   Musculoskeletal:     Comments: R ankle: 2+ DP pulse.  Generalized swelling of the ankle.  Great hammer toe crossing over second toe.  Full range of motion of the ankle including dorsiflexion/plantarflexion/inversion/eversion.  Tenderness to palpation anterior to the lateral malleolus, no tenderness to palpation directly over the lateral/medial malleolus.   Skin:    General: Skin is  warm and dry.   Neurological:     Mental Status: He is alert and oriented to person, place, and time.    (all labs ordered are listed, but only abnormal results are displayed) Labs Reviewed - No data to display  EKG: None  Radiology: DG Ankle Complete Right Result Date: 12/05/2023 CLINICAL DATA:  Pain. EXAM: RIGHT ANKLE - COMPLETE 3+ VIEW COMPARISON:  12/02/2023 and 10/28/2023. FINDINGS: Again seen is a long distal fibular plate traversing a remote displaced distal fibular diaphyseal fracture. Associated distal tibia fibular syndesmosis screw. Hardware alignment is unchanged. Again no purchase of the distal screw in the lateral talus, as seen on  the ankle CT from 10/28/2023. Stable appearance of one bone with displacement of the chronic distal fibular fracture with none union, better seen on the recent CT. Tibiotalar solid fusion is again noted. Moderate plantar calcaneal heel spur. Moderate dorsal midfoot degenerative spurring. Mild diffuse soft tissue edema. IMPRESSION: 1. No change from prior exam. 2. 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 3. Unchanged solid tibiotalar fusion. 4. Mild diffuse soft tissue edema. Electronically Signed   By: Waddell Calk M.D.   On: 12/05/2023 11:50     Procedures   Medications Ordered in the ED  acetaminophen  (TYLENOL ) tablet 1,000 mg (has no administration in time range)                                    Medical Decision Making This patient presents to the ED for concern of ankle pain, this involves an extensive number of treatment options, and is a complaint that carries with it a high risk of complications and morbidity.  The differential diagnosis includes fracture, dislocation, sprain/strain, chronic pain from prior injury.   Co morbidities that complicate the patient evaluation  CHF, CAD, diabetes, hypertension   Additional history obtained:  Additional history obtained from record review External records from outside source obtained and reviewed including 2 prior emergency department notes including imaging and workup results    Cardiac Monitoring: / EKG:  The patient was maintained on a cardiac monitor.  I personally viewed and interpreted the cardiac monitored which showed an underlying rhythm of: NSR   Problem List / ED Course / Critical interventions / Medication management   I ordered medication including Tylenol  for ankle pain Reevaluation of the patient after these medicines showed that the patient stayed the same I have reviewed the patients home medicines and have made adjustments as  needed   Social Determinants of Health:  Homelessness, tobacco use   Test / Admission - Considered:  Physical exam is notable as above.  I did not feel that patient would benefit from additional imaging, given that he has had complete imaging of his ankle twice this week alone, see above for x-ray interpretation.  His ankle pain seems chronic in nature from a prior injury, he has had no change in the character of his pain since being worked up for the same issue twice already this week.  He denies fevers and is not exhibiting any other signs of systemic infection, I did not feel that additional lab work was warranted given his reassuring workup on 6/20.  I discussed this in depth with the patient, he became very agitated and aggressive, stating how would you like if I came over there and hit you in the ankle with a  hammer, at this point he was very defiant and refused to discuss this with me any further.  I did not feel that NSAIDs were appropriate in this patient given his declining kidney function based on review of prior labs, I did provide him with Tylenol  in the emergency department. I provided this patient with the contact information for [facility], I encouraged him to follow-up with their office since he does not have a primary care provider in order to establish care, follow-up in regard to his ankle pain, and manage his chronic conditions.  I encouraged the patient to continue to rest, ice, elevate his ankle and to take Tylenol  as needed for discomfort.  He is appropriate for discharge at this time.    Risk OTC drugs.        Final diagnoses:  Chronic pain of right ankle    ED Discharge Orders     None          Glendia Rocky LOISE DEVONNA 12/06/23 2148    Tegeler, Lonni PARAS, MD 12/07/23 [phone]

## 2023-12-06 NOTE — ED Triage Notes (Signed)
 BIBA from [employer], right ankle pain, swelling- was seen yesterday for same. 168/90 BP  98 HR 96% room air 97.3 T

## 2023-12-20 ENCOUNTER — Emergency Department (HOSPITAL_COMMUNITY)
Admission: EM | Admit: 2023-12-20 | Discharge: 2023-12-20 | Discharge status: Against Medical Advice | Attending: Emergency Medicine | Admitting: Emergency Medicine

## 2023-12-20 ENCOUNTER — Encounter (HOSPITAL_COMMUNITY): Payer: Self-pay | Admitting: Emergency Medicine

## 2023-12-20 ENCOUNTER — Other Ambulatory Visit: Payer: Self-pay

## 2023-12-20 DIAGNOSIS — I480 Paroxysmal atrial fibrillation: Secondary | ICD-10-CM | POA: Diagnosis not present

## 2023-12-20 DIAGNOSIS — M79602 Pain in left arm: Secondary | ICD-10-CM | POA: Insufficient documentation

## 2023-12-20 DIAGNOSIS — Z5321 Procedure and treatment not carried out due to patient leaving prior to being seen by health care provider: Secondary | ICD-10-CM | POA: Insufficient documentation

## 2023-12-20 DIAGNOSIS — I200 Unstable angina: Secondary | ICD-10-CM | POA: Diagnosis not present

## 2023-12-20 DIAGNOSIS — R200 Anesthesia of skin: Secondary | ICD-10-CM | POA: Insufficient documentation

## 2023-12-20 LAB — CBG MONITORING, ED: Glucose-Capillary: 128 mg/dL — ABNORMAL HIGH (ref 70–99)

## 2023-12-20 NOTE — ED Provider Notes (Signed)
   I was informed by nursing staff that the patient left prior to being fully evaluated. I did not evaluate this patient.       Jerrol Agent, MD 12/20/23 [phone]

## 2023-12-20 NOTE — ED Notes (Signed)
 Patient refusing blood draw

## 2023-12-20 NOTE — ED Notes (Signed)
 Patient requests USIV for blood draw. Delay in draw.

## 2023-12-20 NOTE — ED Triage Notes (Signed)
 Patient presents from his hotel via EMS due to left arm pain and numbness which began last night. He was negative for stroke per EMS. Patient was able to ambulate to the lobby. EMS did note a brief moment of a flutter. Patient has not been compliant with medication.    HX cardiac surgery, AFib    EMS vitals: 142/74 BP 87 HR 96% SPO2 on room air 212 CBG

## 2023-12-23 ENCOUNTER — Encounter (HOSPITAL_COMMUNITY): Payer: Self-pay

## 2023-12-23 ENCOUNTER — Inpatient Hospital Stay (HOSPITAL_COMMUNITY)
Admission: EM | Admit: 2023-12-23 | Discharge: 2023-12-27 | DRG: 309 | Disposition: A | Discharge status: Home / Self Care | Attending: Internal Medicine | Admitting: Internal Medicine

## 2023-12-23 ENCOUNTER — Emergency Department (HOSPITAL_COMMUNITY)

## 2023-12-23 ENCOUNTER — Other Ambulatory Visit: Payer: Self-pay

## 2023-12-23 DIAGNOSIS — I130 Hypertensive heart and chronic kidney disease with heart failure and stage 1 through stage 4 chronic kidney disease, or unspecified chronic kidney disease: Secondary | ICD-10-CM | POA: Diagnosis present

## 2023-12-23 DIAGNOSIS — I483 Typical atrial flutter: Secondary | ICD-10-CM | POA: Diagnosis present

## 2023-12-23 DIAGNOSIS — E785 Hyperlipidemia, unspecified: Secondary | ICD-10-CM | POA: Diagnosis present

## 2023-12-23 DIAGNOSIS — F4321 Adjustment disorder with depressed mood: Secondary | ICD-10-CM | POA: Diagnosis not present

## 2023-12-23 DIAGNOSIS — I351 Nonrheumatic aortic (valve) insufficiency: Secondary | ICD-10-CM | POA: Diagnosis present

## 2023-12-23 DIAGNOSIS — D631 Anemia in chronic kidney disease: Secondary | ICD-10-CM | POA: Diagnosis present

## 2023-12-23 DIAGNOSIS — F1210 Cannabis abuse, uncomplicated: Secondary | ICD-10-CM | POA: Diagnosis present

## 2023-12-23 DIAGNOSIS — Z91148 Patient's other noncompliance with medication regimen for other reason: Secondary | ICD-10-CM | POA: Diagnosis not present

## 2023-12-23 DIAGNOSIS — E1122 Type 2 diabetes mellitus with diabetic chronic kidney disease: Secondary | ICD-10-CM | POA: Diagnosis present

## 2023-12-23 DIAGNOSIS — G8929 Other chronic pain: Secondary | ICD-10-CM | POA: Diagnosis present

## 2023-12-23 DIAGNOSIS — F1491 Cocaine use, unspecified, in remission: Secondary | ICD-10-CM

## 2023-12-23 DIAGNOSIS — Z8249 Family history of ischemic heart disease and other diseases of the circulatory system: Secondary | ICD-10-CM

## 2023-12-23 DIAGNOSIS — N1831 Chronic kidney disease, stage 3a: Secondary | ICD-10-CM | POA: Diagnosis present

## 2023-12-23 DIAGNOSIS — I25110 Atherosclerotic heart disease of native coronary artery with unstable angina pectoris: Secondary | ICD-10-CM | POA: Diagnosis present

## 2023-12-23 DIAGNOSIS — N183 Chronic kidney disease, stage 3 unspecified: Secondary | ICD-10-CM | POA: Diagnosis present

## 2023-12-23 DIAGNOSIS — E66811 Obesity, class 1: Secondary | ICD-10-CM | POA: Diagnosis present

## 2023-12-23 DIAGNOSIS — Z5901 Sheltered homelessness: Secondary | ICD-10-CM | POA: Diagnosis not present

## 2023-12-23 DIAGNOSIS — F17210 Nicotine dependence, cigarettes, uncomplicated: Secondary | ICD-10-CM | POA: Diagnosis present

## 2023-12-23 DIAGNOSIS — I5022 Chronic systolic (congestive) heart failure: Secondary | ICD-10-CM | POA: Diagnosis present

## 2023-12-23 DIAGNOSIS — Z794 Long term (current) use of insulin: Secondary | ICD-10-CM | POA: Diagnosis not present

## 2023-12-23 DIAGNOSIS — Z7901 Long term (current) use of anticoagulants: Secondary | ICD-10-CM | POA: Diagnosis not present

## 2023-12-23 DIAGNOSIS — F1910 Other psychoactive substance abuse, uncomplicated: Secondary | ICD-10-CM | POA: Diagnosis present

## 2023-12-23 DIAGNOSIS — I200 Unstable angina: Principal | ICD-10-CM

## 2023-12-23 DIAGNOSIS — I4892 Unspecified atrial flutter: Secondary | ICD-10-CM | POA: Diagnosis present

## 2023-12-23 DIAGNOSIS — Z6830 Body mass index (BMI) 30.0-30.9, adult: Secondary | ICD-10-CM

## 2023-12-23 DIAGNOSIS — F4320 Adjustment disorder, unspecified: Secondary | ICD-10-CM | POA: Diagnosis present

## 2023-12-23 DIAGNOSIS — Z951 Presence of aortocoronary bypass graft: Secondary | ICD-10-CM

## 2023-12-23 DIAGNOSIS — I480 Paroxysmal atrial fibrillation: Principal | ICD-10-CM | POA: Diagnosis present

## 2023-12-23 DIAGNOSIS — Z7984 Long term (current) use of oral hypoglycemic drugs: Secondary | ICD-10-CM

## 2023-12-23 DIAGNOSIS — I10 Essential (primary) hypertension: Secondary | ICD-10-CM | POA: Diagnosis present

## 2023-12-23 DIAGNOSIS — F1410 Cocaine abuse, uncomplicated: Secondary | ICD-10-CM | POA: Diagnosis present

## 2023-12-23 DIAGNOSIS — Z7982 Long term (current) use of aspirin: Secondary | ICD-10-CM

## 2023-12-23 DIAGNOSIS — F4381 Prolonged grief disorder: Secondary | ICD-10-CM | POA: Diagnosis present

## 2023-12-23 DIAGNOSIS — R079 Chest pain, unspecified: Secondary | ICD-10-CM

## 2023-12-23 LAB — BRAIN NATRIURETIC PEPTIDE: B Natriuretic Peptide: 36.3 pg/mL (ref 0.0–100.0)

## 2023-12-23 LAB — RAPID URINE DRUG SCREEN, HOSP PERFORMED
Amphetamines: NOT DETECTED
Barbiturates: NOT DETECTED
Benzodiazepines: NOT DETECTED
Cocaine: POSITIVE — AB
Opiates: NOT DETECTED
Tetrahydrocannabinol: POSITIVE — AB

## 2023-12-23 LAB — PROTIME-INR
INR: 1.1 (ref 0.8–1.2)
Prothrombin Time: 14.6 s (ref 11.4–15.2)

## 2023-12-23 LAB — URINALYSIS, ROUTINE W REFLEX MICROSCOPIC
Bilirubin Urine: NEGATIVE
Glucose, UA: NEGATIVE mg/dL
Hgb urine dipstick: NEGATIVE
Ketones, ur: NEGATIVE mg/dL
Nitrite: NEGATIVE
Protein, ur: NEGATIVE mg/dL
Specific Gravity, Urine: 1.016 (ref 1.005–1.030)
pH: 5 (ref 5.0–8.0)

## 2023-12-23 LAB — CBC WITH DIFFERENTIAL/PLATELET
Abs Immature Granulocytes: 0.01 K/uL (ref 0.00–0.07)
Basophils Absolute: 0.1 K/uL (ref 0.0–0.1)
Basophils Relative: 1 %
Eosinophils Absolute: 0.2 K/uL (ref 0.0–0.5)
Eosinophils Relative: 4 %
HCT: 42.2 % (ref 39.0–52.0)
Hemoglobin: 13.9 g/dL (ref 13.0–17.0)
Immature Granulocytes: 0 %
Lymphocytes Relative: 51 %
Lymphs Abs: 2.8 K/uL (ref 0.7–4.0)
MCH: 30.4 pg (ref 26.0–34.0)
MCHC: 32.9 g/dL (ref 30.0–36.0)
MCV: 92.3 fL (ref 80.0–100.0)
Monocytes Absolute: 0.5 K/uL (ref 0.1–1.0)
Monocytes Relative: 10 %
Neutro Abs: 1.9 K/uL (ref 1.7–7.7)
Neutrophils Relative %: 34 %
Platelets: 305 K/uL (ref 150–400)
RBC: 4.57 MIL/uL (ref 4.22–5.81)
RDW: 15.9 % — ABNORMAL HIGH (ref 11.5–15.5)
WBC: 5.4 K/uL (ref 4.0–10.5)
nRBC: 0 % (ref 0.0–0.2)

## 2023-12-23 LAB — COMPREHENSIVE METABOLIC PANEL WITH GFR
ALT: 7 U/L (ref 0–44)
AST: 12 U/L — ABNORMAL LOW (ref 15–41)
Albumin: 3.6 g/dL (ref 3.5–5.0)
Alkaline Phosphatase: 48 U/L (ref 38–126)
Anion gap: 11 (ref 5–15)
BUN: 15 mg/dL (ref 8–23)
CO2: 18 mmol/L — ABNORMAL LOW (ref 22–32)
Calcium: 9.5 mg/dL (ref 8.9–10.3)
Chloride: 110 mmol/L (ref 98–111)
Creatinine, Ser: 1.38 mg/dL — ABNORMAL HIGH (ref 0.61–1.24)
GFR, Estimated: 56 mL/min — ABNORMAL LOW (ref >60)
Glucose, Bld: 89 mg/dL (ref 70–99)
Potassium: 4.3 mmol/L (ref 3.5–5.1)
Sodium: 139 mmol/L (ref 135–145)
Total Bilirubin: 0.5 mg/dL (ref 0.0–1.2)
Total Protein: 7.2 g/dL (ref 6.5–8.1)

## 2023-12-23 LAB — TROPONIN I (HIGH SENSITIVITY)
Troponin I (High Sensitivity): 15 ng/L (ref <18)
Troponin I (High Sensitivity): 16 ng/L (ref <18)

## 2023-12-23 LAB — CBG MONITORING, ED: Glucose-Capillary: 106 mg/dL — ABNORMAL HIGH (ref 70–99)

## 2023-12-23 LAB — APTT: aPTT: 31 s (ref 24–36)

## 2023-12-23 LAB — MAGNESIUM: Magnesium: 2.1 mg/dL (ref 1.7–2.4)

## 2023-12-23 MED ORDER — RIVAROXABAN 10 MG PO TABS
20.0000 mg | ORAL_TABLET | Freq: Once | ORAL | Status: AC
  Administered 2023-12-23: 20 mg via ORAL
  Filled 2023-12-23: qty 2

## 2023-12-23 MED ORDER — DILTIAZEM LOAD VIA INFUSION
20.0000 mg | Freq: Once | INTRAVENOUS | Status: DC
  Filled 2023-12-23: qty 20

## 2023-12-23 MED ORDER — ASPIRIN 81 MG PO CHEW
81.0000 mg | CHEWABLE_TABLET | Freq: Every day | ORAL | Status: DC
Start: 2023-12-24 — End: 2023-12-27
  Administered 2023-12-24 – 2023-12-27 (×4): 81 mg via ORAL
  Filled 2023-12-23 (×4): qty 1

## 2023-12-23 MED ORDER — NITROGLYCERIN 0.4 MG SL SUBL: 0.4000 mg | SUBLINGUAL_TABLET | SUBLINGUAL | Status: DC | PRN

## 2023-12-23 MED ORDER — SACUBITRIL-VALSARTAN 24-26 MG PO TABS
1.0000 | ORAL_TABLET | Freq: Two times a day (BID) | ORAL | Status: DC
  Administered 2023-12-24 – 2023-12-26 (×6): 1 via ORAL
  Filled 2023-12-23 (×7): qty 1

## 2023-12-23 MED ORDER — ONDANSETRON HCL 4 MG PO TABS: 4.0000 mg | ORAL_TABLET | Freq: Four times a day (QID) | ORAL | Status: DC | PRN

## 2023-12-23 MED ORDER — METFORMIN HCL 500 MG PO TABS
1000.0000 mg | ORAL_TABLET | Freq: Every day | ORAL | Status: DC
  Administered 2023-12-24 – 2023-12-27 (×4): 1000 mg via ORAL
  Filled 2023-12-23 (×4): qty 2

## 2023-12-23 MED ORDER — SORBITOL 70 % SOLN
30.0000 mL | Freq: Every day | Status: DC | PRN
  Filled 2023-12-23: qty 30

## 2023-12-23 MED ORDER — AMIODARONE HCL 200 MG PO TABS
200.0000 mg | ORAL_TABLET | Freq: Every day | ORAL | Status: DC
  Administered 2023-12-23 – 2023-12-27 (×5): 200 mg via ORAL
  Filled 2023-12-23 (×5): qty 1

## 2023-12-23 MED ORDER — DILTIAZEM HCL-DEXTROSE 125-5 MG/125ML-% IV SOLN (PREMIX)
5.0000 mg/h | INTRAVENOUS | Status: DC
  Filled 2023-12-23: qty 125

## 2023-12-23 MED ORDER — ACETAMINOPHEN 325 MG PO TABS: 650.0000 mg | ORAL_TABLET | Freq: Four times a day (QID) | ORAL | Status: DC | PRN

## 2023-12-23 MED ORDER — SENNOSIDES-DOCUSATE SODIUM 8.6-50 MG PO TABS
1.0000 | ORAL_TABLET | Freq: Two times a day (BID) | ORAL | Status: DC
  Administered 2023-12-24 – 2023-12-27 (×6): 1 via ORAL
  Filled 2023-12-23 (×7): qty 1

## 2023-12-23 MED ORDER — MELATONIN 3 MG PO TABS: 3.0000 mg | ORAL_TABLET | Freq: Every evening | ORAL | Status: DC | PRN

## 2023-12-23 MED ORDER — RIVAROXABAN 15 MG PO TABS: 15.0000 mg | ORAL_TABLET | Freq: Once | ORAL | Status: DC

## 2023-12-23 MED ORDER — ATORVASTATIN CALCIUM 40 MG PO TABS
40.0000 mg | ORAL_TABLET | Freq: Every day | ORAL | Status: DC
  Administered 2023-12-24 – 2023-12-27 (×4): 40 mg via ORAL
  Filled 2023-12-23 (×4): qty 1

## 2023-12-23 MED ORDER — ACETAMINOPHEN 650 MG RE SUPP: 650.0000 mg | Freq: Four times a day (QID) | RECTAL | Status: DC | PRN

## 2023-12-23 MED ORDER — ASPIRIN 81 MG PO CHEW
324.0000 mg | CHEWABLE_TABLET | Freq: Once | ORAL | Status: AC
  Administered 2023-12-23: 324 mg via ORAL
  Filled 2023-12-23: qty 4

## 2023-12-23 MED ORDER — ONDANSETRON HCL 4 MG/2ML IJ SOLN: 4.0000 mg | Freq: Four times a day (QID) | INTRAMUSCULAR | Status: DC | PRN

## 2023-12-23 NOTE — H&P (Signed)
 History and Physical    Patient: Jeremy [name] FMW:[id number] DOB: April 28, 1956 DOA: 12/23/2023 DOS: the patient was seen and examined on 12/23/2023 PCP: Sadie Manna, MD  Patient coming from: Homeless  Chief Complaint:  Chief Complaint  Patient presents with   Chest Pain   HPI: Jeremy [name] is a 68 y.o. unhoused male with medical history significant for paroxysmal atrial fibrillation, coronary artery disease with history of bypass surgery, CKD, type 2 diabetes mellitus and cardiomyopathy who presents with chest pain.  The patient is vague on how long he had the chest pain or exactly what time it started.  He does say it was in his epigastric region he also reports feeling like his heart was racing and pounding out of his chest.  He had similar feelings yesterday and presented to the ER but he did not stay long enough to be seen.  He reports he is also having trouble breathing.  He tells me he last took his medication many months ago.  Since he is unhoused currently he does not have a way to refill or keep his medication.  He has not been able to follow-up with any doctors especially not his cardiologist in [place]. He cannot remember all of the medications he previously was taking but he does recall Xarelto  and amiodarone . On arrival in the emergency department the patient was noted to be in a flutter with a rapid ventricular response.  His heart rates have been in the 130s to 150s.  His heart rate actually came down on its own to about 100 prior to him receiving the IV Cardizem  which was ordered. He will be admitted on telemetry.  He is currently chest pain-free.  We will continue to cycle his cardiac enzymes and get him back on his medication to control his heart rate.    Review of Systems: As mentioned in the history of present illness. All other systems reviewed and are negative. Past Medical History:  Diagnosis Date   CHF (congestive heart failure) (HCC)    Coronary artery  disease    Diabetes mellitus without complication (HCC)    Hypertension    S/P CABG x 1    Past Surgical History:  Procedure Laterality Date   ANKLE ARTHROSCOPY W/ OPEN REPAIR Right    CORONARY ARTERY BYPASS GRAFT     PULMONARY THROMBECTOMY Bilateral 12/24/2020   Procedure: PULMONARY THROMBECTOMY;  Surgeon: Marea Selinda RAMAN, MD;  Location: [facility] INVASIVE CV LAB;  Service: Cardiovascular;  Laterality: Bilateral;   Social History:  reports that he has been smoking cigarettes. He has never used smokeless tobacco. He reports current alcohol  use of about 3.0 standard drinks of alcohol  per week. He reports that he does not currently use drugs after having used the following drugs: Marijuana.  No Known Allergies  Family History  Problem Relation Age of Onset   Heart attack Father     Prior to Admission medications   Medication Sig Start Date End Date Taking? Authorizing Provider  acetaminophen  (TYLENOL ) 500 MG tablet Take 1 tablet (500 mg total) by mouth every 6 (six) hours as needed. Patient not taking: Reported on 10/27/2023 10/23/23   Mannie Pac T, DO  amiodarone  (PACERONE ) 200 MG tablet Take 1 tablet (200 mg total) by mouth daily. 10/20/23 01/19/24  Malvina Alm DASEN, MD  aspirin  81 MG chewable tablet Chew 1 tablet (81 mg total) by mouth daily. 10/20/23 02/17/24  Malvina Alm DASEN, MD  atorvastatin  (LIPITOR) 40 MG tablet Take 1 tablet (40 mg  total) by mouth daily. 10/20/23   Malvina Alm DASEN, MD  carvedilol  (COREG ) 3.125 MG tablet Take 1 tablet (3.125 mg total) by mouth 2 (two) times daily. Reduced from 6.25 mg. 10/20/23 01/19/24  Malvina Alm DASEN, MD  metFORMIN  (GLUCOPHAGE ) 1000 MG tablet Take 1 tablet (1,000 mg total) by mouth once daily with breakfast. 10/20/23 01/19/24  Malvina Alm DASEN, MD  nitroGLYCERIN  (NITROSTAT ) 0.4 MG SL tablet Place 1 tablet (0.4 mg total) under the tongue every 5 (five) minutes as needed for chest pain. 11/26/22   Alexander, Natalie, DO  pantoprazole  (PROTONIX ) 20 MG tablet Take 1 tablet (20  mg total) by mouth daily. 06/28/23   Alexander, Natalie, DO  polyethylene glycol (MIRALAX  / GLYCOLAX ) 17 g packet Take 17 g by mouth daily as needed for moderate constipation. 11/04/23   Sebastian Toribio GAILS, MD  rivaroxaban  (XARELTO ) 20 MG TABS tablet Take 1 tablet (20 mg total) by mouth daily at 5 pm 10/20/23 01/19/24  Malvina Alm DASEN, MD  sacubitril -valsartan  (ENTRESTO ) 24-26 MG Take 1 tablet by mouth every 12 (twelve) hours. 11/18/23     senna-docusate (SENOKOT-S) 8.6-50 MG tablet Take 1 tablet by mouth 2 (two) times daily. 11/04/23   Sebastian Toribio GAILS, MD  traMADol  (ULTRAM ) 50 MG tablet Take 1 tablet (50 mg total) by mouth every 6 (six) hours as needed. 11/04/23 11/03/24  Sebastian Toribio GAILS, MD    Physical Exam: Vitals:   12/23/23 1726 12/23/23 1845 12/23/23 1900 12/23/23 1915  BP: 118/73 122/86  129/69  Pulse: (!) 126 99  86  Resp: 18 (!) 23 (!) 21 17  Temp: 98.1 F (36.7 C)     TempSrc: Oral     SpO2: 98% 99%  100%  Weight:      Height:       Physical Exam:  General: No acute distress, poor dentition, disheveled, appears older than stated age HEENT: Normocephalic, atraumatic, PERRL Cardiovascular: tachycardic, irregular rhythm. Distal pulses faint Pulmonary: Normal pulmonary effort, normal breath sounds Gastrointestinal: Nondistended abdomen, soft, non-tender, normoactive bowel sounds Musculoskeletal:No lower ext edema Skin: Skin is warm and dry. Neuro: No focal deficits noted, AAOx3. PSYCH: Attentive and cooperative  Data Reviewed:  Results for orders placed or performed during the hospital encounter of 12/23/23 (from the past 24 hours)  CBG monitoring, ED     Status: Abnormal   Collection Time: 12/23/23  6:01 PM  Result Value Ref Range   Glucose-Capillary 106 (H) 70 - 99 mg/dL  CBC with Differential     Status: Abnormal   Collection Time: 12/23/23  6:03 PM  Result Value Ref Range   WBC 5.4 4.0 - 10.5 K/uL   RBC 4.57 4.22 - 5.81 MIL/uL   Hemoglobin 13.9 13.0 - 17.0 g/dL   HCT  57.7 60.9 - 47.9 %   MCV 92.3 80.0 - 100.0 fL   MCH 30.4 26.0 - 34.0 pg   MCHC 32.9 30.0 - 36.0 g/dL   RDW 84.0 (H) 88.4 - 84.4 %   Platelets 305 150 - 400 K/uL   nRBC 0.0 0.0 - 0.2 %   Neutrophils Relative % 34 %   Neutro Abs 1.9 1.7 - 7.7 K/uL   Lymphocytes Relative 51 %   Lymphs Abs 2.8 0.7 - 4.0 K/uL   Monocytes Relative 10 %   Monocytes Absolute 0.5 0.1 - 1.0 K/uL   Eosinophils Relative 4 %   Eosinophils Absolute 0.2 0.0 - 0.5 K/uL   Basophils Relative 1 %   Basophils Absolute  0.1 0.0 - 0.1 K/uL   Immature Granulocytes 0 %   Abs Immature Granulocytes 0.01 0.00 - 0.07 K/uL  Comprehensive metabolic panel     Status: Abnormal   Collection Time: 12/23/23  6:03 PM  Result Value Ref Range   Sodium 139 135 - 145 mmol/L   Potassium 4.3 3.5 - 5.1 mmol/L   Chloride 110 98 - 111 mmol/L   CO2 18 (L) 22 - 32 mmol/L   Glucose, Bld 89 70 - 99 mg/dL   BUN 15 8 - 23 mg/dL   Creatinine, Ser 8.61 (H) 0.61 - 1.24 mg/dL   Calcium  9.5 8.9 - 10.3 mg/dL   Total Protein 7.2 6.5 - 8.1 g/dL   Albumin 3.6 3.5 - 5.0 g/dL   AST 12 (L) 15 - 41 U/L   ALT 7 0 - 44 U/L   Alkaline Phosphatase 48 38 - 126 U/L   Total Bilirubin 0.5 0.0 - 1.2 mg/dL   GFR, Estimated 56 (L) >60 mL/min   Anion gap 11 5 - 15  Troponin I (High Sensitivity)     Status: None   Collection Time: 12/23/23  6:03 PM  Result Value Ref Range   Troponin I (High Sensitivity) 15 <18 ng/L  Brain natriuretic peptide     Status: None   Collection Time: 12/23/23  6:03 PM  Result Value Ref Range   B Natriuretic Peptide 36.3 0.0 - 100.0 pg/mL  Magnesium      Status: None   Collection Time: 12/23/23  6:03 PM  Result Value Ref Range   Magnesium  2.1 1.7 - 2.4 mg/dL     Assessment and Plan: Aflutter w RVR 2. Chest Pain 3. CM with mild exacerbation likely due to RVR - Restart Amio - Restart  Xarelto  - Cycle cardiac enzymes - Outpatient Cardiology Follow up for possible stress testing  - One dose of lasix  due to sob. - Restart  entresto   3. DMT2 - corrective dose insulin .  Restart Metformin   4. PT/OT  5. SW consult in am - help with homelessness  6. Prolonged Grief reaction -the patient was frequently tearful about his girlfriend of 22 years who left him.  This occurred over a year ago.  He is having a hard time moving on from this. - Consider starting Paxil if patient agreeable. - Outpatient therapy would be ieal     Advance Care Planning:   Code Status: Prior the patient names his nephew Jeremy [name] as his surrogate decision maker and wants to be full code.  Consults: none  Family Communication: none  Severity of Illness: The appropriate patient status for this patient is INPATIENT. Inpatient status is judged to be reasonable and necessary in order to provide the required intensity of service to ensure the patient's safety. The patient's presenting symptoms, physical exam findings, and initial radiographic and laboratory data in the context of their chronic comorbidities is felt to place them at high risk for further clinical deterioration. Furthermore, it is not anticipated that the patient will be medically stable for discharge from the hospital within 2 midnights of admission.   * I certify that at the point of admission it is my clinical judgment that the patient will require inpatient hospital care spanning beyond 2 midnights from the point of admission due to high intensity of service, high risk for further deterioration and high frequency of surveillance required.*  Author: ARTHEA CHILD, MD 12/23/2023 9:02 PM  For on call review [email or web address].

## 2023-12-23 NOTE — ED Triage Notes (Signed)
 Pt here for CP that started at 1pm, non radiating. Sinus tach. Axox4. Denies SHOB.

## 2023-12-23 NOTE — ED Provider Notes (Signed)
 [place] EMERGENCY DEPARTMENT AT [facility] Provider Note   CSN: [id number] Arrival date & time: 12/23/23  1724     Patient presents with: Chest Pain   Jeremy [name] is a 68 y.o. male.    Chest Pain  This patient is a 68 year old male, he has a history of atrial flutter fibrillation, he is a diabetic, he is not anticoagulated despite being prescribed rivaroxaban  as recently as May of this year, he states he is out of medications and does not take any.  He presents with chest pain and shortness of breath and noted to be particularly tachycardic on arrival, he states that started about 4 hours ago but has had some degree of shortness of breath and chest pain ongoing for the last couple of weeks.  Review of the medical record shows that the patient was seen in the emergency department several days ago at [facility], he had chronic pain in his ankle in June and had 2 visits for that, he was seen for his history of atrial fibrillation, DVT and hypertension in the cardiology office at the [facility] at the beginning of June and had been seen multiple times for his tacky arrhythmias over the last 6 months.  The patient had an echocardiogram in January of this year, it showed that he had an ejection fraction of 35 to 40% with global left ventricular hypokinesis and moderate LVH    Prior to Admission medications   Medication Sig Start Date End Date Taking? Authorizing Provider  acetaminophen  (TYLENOL ) 500 MG tablet Take 1 tablet (500 mg total) by mouth every 6 (six) hours as needed. Patient not taking: Reported on 10/27/2023 10/23/23   Mannie Pac T, DO  amiodarone  (PACERONE ) 200 MG tablet Take 1 tablet (200 mg total) by mouth daily. 10/20/23 01/19/24  Malvina Alm DASEN, MD  aspirin  81 MG chewable tablet Chew 1 tablet (81 mg total) by mouth daily. 10/20/23 02/17/24  Malvina Alm DASEN, MD  atorvastatin  (LIPITOR) 40 MG tablet Take 1 tablet (40 mg total) by mouth daily. 10/20/23   Malvina Alm DASEN, MD  carvedilol  (COREG ) 3.125 MG tablet Take 1 tablet (3.125 mg total) by mouth 2 (two) times daily. Reduced from 6.25 mg. 10/20/23 01/19/24  Malvina Alm DASEN, MD  metFORMIN  (GLUCOPHAGE ) 1000 MG tablet Take 1 tablet (1,000 mg total) by mouth once daily with breakfast. 10/20/23 01/19/24  Malvina Alm DASEN, MD  nitroGLYCERIN  (NITROSTAT ) 0.4 MG SL tablet Place 1 tablet (0.4 mg total) under the tongue every 5 (five) minutes as needed for chest pain. 11/26/22   Alexander, Natalie, DO  pantoprazole  (PROTONIX ) 20 MG tablet Take 1 tablet (20 mg total) by mouth daily. 06/28/23   Alexander, Natalie, DO  polyethylene glycol (MIRALAX  / GLYCOLAX ) 17 g packet Take 17 g by mouth daily as needed for moderate constipation. 11/04/23   Sebastian Toribio GAILS, MD  rivaroxaban  (XARELTO ) 20 MG TABS tablet Take 1 tablet (20 mg total) by mouth daily at 5 pm 10/20/23 01/19/24  Malvina Alm DASEN, MD  sacubitril -valsartan  (ENTRESTO ) 24-26 MG Take 1 tablet by mouth every 12 (twelve) hours. 11/18/23     senna-docusate (SENOKOT-S) 8.6-50 MG tablet Take 1 tablet by mouth 2 (two) times daily. 11/04/23   Sebastian Toribio GAILS, MD  traMADol  (ULTRAM ) 50 MG tablet Take 1 tablet (50 mg total) by mouth every 6 (six) hours as needed. 11/04/23 11/03/24  Sebastian Toribio GAILS, MD    Allergies: Patient has no known allergies.    Review  of Systems  Cardiovascular:  Positive for chest pain.  All other systems reviewed and are negative.   Updated Vital Signs BP 129/69   Pulse 86   Temp 98.1 F (36.7 C) (Oral)   Resp 17   Ht 1.803 m (5' 11)   Wt 99.8 kg   SpO2 100%   BMI 30.68 kg/m   Physical Exam Vitals and nursing note reviewed.  Constitutional:      General: He is in acute distress.     Appearance: He is well-developed. He is ill-appearing.  HENT:     Head: Normocephalic and atraumatic.     Mouth/Throat:     Pharynx: No oropharyngeal exudate.  Eyes:     General: No scleral icterus.       Right eye: No discharge.        Left eye: No discharge.      Conjunctiva/sclera: Conjunctivae normal.     Pupils: Pupils are equal, round, and reactive to light.  Neck:     Thyroid : No thyromegaly.     Vascular: No JVD.  Cardiovascular:     Rate and Rhythm: Tachycardia present. Rhythm irregular.     Heart sounds: Normal heart sounds. No murmur heard.    No friction rub. No gallop.  Pulmonary:     Effort: Pulmonary effort is normal. No respiratory distress.     Breath sounds: Normal breath sounds. No wheezing or rales.  Abdominal:     General: Bowel sounds are normal. There is no distension.     Palpations: Abdomen is soft. There is no mass.     Tenderness: There is no abdominal tenderness.  Musculoskeletal:        General: No tenderness. Normal range of motion.     Cervical back: Normal range of motion and neck supple.     Right lower leg: No edema.     Left lower leg: No edema.  Lymphadenopathy:     Cervical: No cervical adenopathy.  Skin:    General: Skin is warm and dry.     Findings: No erythema or rash.  Neurological:     Mental Status: He is alert.     Coordination: Coordination normal.  Psychiatric:        Behavior: Behavior normal.     (all labs ordered are listed, but only abnormal results are displayed) Labs Reviewed  CBC WITH DIFFERENTIAL/PLATELET - Abnormal; Notable for the following components:      Result Value   RDW 15.9 (*)    All other components within normal limits  COMPREHENSIVE METABOLIC PANEL WITH GFR - Abnormal; Notable for the following components:   CO2 18 (*)    Creatinine, Ser 1.38 (*)    AST 12 (*)    GFR, Estimated 56 (*)    All other components within normal limits  CBG MONITORING, ED - Abnormal; Notable for the following components:   Glucose-Capillary 106 (*)    All other components within normal limits  BRAIN NATRIURETIC PEPTIDE  MAGNESIUM   PROTIME-INR  APTT  TROPONIN I (HIGH SENSITIVITY)  TROPONIN I (HIGH SENSITIVITY)    EKG performed on December 23, 2023 at 5:30 PM shows atrial  flutter with 2-1 AV block, rate of about 126 bpm, leftward axis, nonspecific T waves, no ST elevation, abnormal EKG, no LVH  EKG: EKG Interpretation Date/Time:  Friday December 23 2023 19:21:59 EDT Ventricular Rate:  79 PR Interval:    QRS Duration:  106 QT Interval:  481 QTC Calculation: 552 R  Axis:   32  Text Interpretation: Atrial flutter with predominant 3:1 AV block Multiple ventricular premature complexes Nonspecific T abnormalities, inferior leads Prolonged QT interval Confirmed by Cleotilde Rogue ([id number]) on 12/23/2023 7:24:46 PM  Radiology: [facility] Chest Port 1 View Result Date: 12/23/2023 CLINICAL DATA:  sob, cp EXAM: PORTABLE CHEST - 1 VIEW COMPARISON:  Oct 25, 2023 FINDINGS: Lower lung volumes. No focal airspace consolidation, pleural effusion, or pneumothorax. Mild cardiomegaly. Sternotomy wires and CABG markers. Tortuous aorta with aortic atherosclerosis. No acute fracture or destructive lesion. Multilevel thoracic osteophytosis. Bone-on-bone articulation of the left glenohumeral joint. IMPRESSION: No acute cardiopulmonary abnormality. Electronically Signed   By: Rogelia Myers M.D.   On: 12/23/2023 19:10     Procedures   Medications Ordered in the ED  diltiazem  (CARDIZEM ) 1 mg/mL load via infusion 20 mg (0 mg Intravenous Hold 12/23/23 1926)    And  diltiazem  (CARDIZEM ) 125 mg in dextrose  5% 125 mL (1 mg/mL) infusion (0 mg/hr Intravenous Hold 12/23/23 1926)  aspirin  chewable tablet 324 mg (has no administration in time range)  nitroGLYCERIN  (NITROSTAT ) SL tablet 0.4 mg (has no administration in time range)  rivaroxaban  (XARELTO ) tablet 20 mg (has no administration in time range)                                    Medical Decision Making Amount and/or Complexity of Data Reviewed Labs: ordered. Radiology: ordered. ECG/medicine tests: ordered.  Risk OTC drugs. Prescription drug management. Decision regarding hospitalization.   This patient is ill-appearing and what appears  to be atrial flutter with rapid ventricular rate, I suspect he is in 2-1 atrial flutter, he is not anticoagulated not on his medications and is not taking the amiodarone , the Coreg , the metformin  the rivaroxaban  or the valsartan  sacubitril  that he had been prescribed in the past.   This patient presents to the ED for concern of shortness of breath and chest pain, this involves an extensive number of treatment options, and is a complaint that carries with it a high risk of complications and morbidity.  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  The pertinent results include: CBC without any acute findings, metabolic panel with renal insufficiency, mild decrease in CO2 consistent with prior values, troponin of 15, BNP of 36 and a magnesium  of 2.1     Imaging Studies ordered:  I ordered imaging studies including chest x-ray I independently visualized and interpreted imaging which showed no acute findings I agree with the radiologist interpretation   Cardiac Monitoring: / EKG:  The patient was maintained on a cardiac monitor.  I personally viewed and interpreted the cardiac monitored which showed an underlying rhythm of: atrial flutter   Problem List / ED Course / Critical interventions / Medication management  Prior to being given Cardizem  the patient was placed in a room and while awaiting the medications the heart rate slowed  naturally down to  approximately 79 bpm in rate controlled atrial flutter. I ordered medication including xarelto , nitroglycerin  / asa   Reevaluation of the patient after these medicines showed that the patient improved I have reviewed the patients home medicines and have made adjustments as needed   Consultations Obtained:  I requested consultation with the hospitalist Dr. Heather,  and discussed lab and imaging findings as well as pertinent plan - they recommend: admission   Social Determinants of Health:  Heart failure   Test / Admission - Considered:  Admit to hospital      Final diagnoses:  Unstable angina (HCC)  Typical atrial flutter (HCC)  Non compliance w medication regimen    ED Discharge Orders     None          Cleotilde Rogue, MD 12/23/23 2053

## 2023-12-24 DIAGNOSIS — I4892 Unspecified atrial flutter: Secondary | ICD-10-CM | POA: Diagnosis not present

## 2023-12-24 LAB — CBC
HCT: 42.2 % (ref 39.0–52.0)
Hemoglobin: 13.4 g/dL (ref 13.0–17.0)
MCH: 30 pg (ref 26.0–34.0)
MCHC: 31.8 g/dL (ref 30.0–36.0)
MCV: 94.6 fL (ref 80.0–100.0)
Platelets: 267 K/uL (ref 150–400)
RBC: 4.46 MIL/uL (ref 4.22–5.81)
RDW: 16 % — ABNORMAL HIGH (ref 11.5–15.5)
WBC: 5.9 K/uL (ref 4.0–10.5)
nRBC: 0 % (ref 0.0–0.2)

## 2023-12-24 LAB — BASIC METABOLIC PANEL WITH GFR
Anion gap: 9 (ref 5–15)
BUN: 17 mg/dL (ref 8–23)
CO2: 18 mmol/L — ABNORMAL LOW (ref 22–32)
Calcium: 9 mg/dL (ref 8.9–10.3)
Chloride: 112 mmol/L — ABNORMAL HIGH (ref 98–111)
Creatinine, Ser: 1.44 mg/dL — ABNORMAL HIGH (ref 0.61–1.24)
GFR, Estimated: 54 mL/min — ABNORMAL LOW (ref >60)
Glucose, Bld: 91 mg/dL (ref 70–99)
Potassium: 4 mmol/L (ref 3.5–5.1)
Sodium: 139 mmol/L (ref 135–145)

## 2023-12-24 LAB — BRAIN NATRIURETIC PEPTIDE: B Natriuretic Peptide: 25.5 pg/mL (ref 0.0–100.0)

## 2023-12-24 LAB — TROPONIN I (HIGH SENSITIVITY): Troponin I (High Sensitivity): 15 ng/L (ref <18)

## 2023-12-24 MED ORDER — CARVEDILOL 3.125 MG PO TABS
3.1250 mg | ORAL_TABLET | Freq: Two times a day (BID) | ORAL | Status: DC
  Administered 2023-12-24 – 2023-12-27 (×7): 3.125 mg via ORAL
  Filled 2023-12-24 (×7): qty 1

## 2023-12-24 MED ORDER — PANTOPRAZOLE SODIUM 40 MG PO TBEC
40.0000 mg | DELAYED_RELEASE_TABLET | Freq: Two times a day (BID) | ORAL | Status: DC
  Administered 2023-12-24 – 2023-12-27 (×7): 40 mg via ORAL
  Filled 2023-12-24 (×7): qty 1

## 2023-12-24 MED ORDER — RIVAROXABAN 20 MG PO TABS
20.0000 mg | ORAL_TABLET | Freq: Every day | ORAL | Status: DC
  Administered 2023-12-24 – 2023-12-26 (×3): 20 mg via ORAL
  Filled 2023-12-24 (×3): qty 1

## 2023-12-24 NOTE — Progress Notes (Signed)
 Patient is alert and oriented to full name, DOB, place, and disoriented to current year. Patient remains on room air and denies pain at this time.  Safety measures are in place, call light is within reach, bed alarm on, and 4P's addressed.   Patient educated on use of call bell for assistance and educated on fall safety this shift.

## 2023-12-24 NOTE — Progress Notes (Addendum)
 Progress Note    [place]  FMW:[id number] DOB: [birth year]  DOA: 12/23/2023 PCP: Sadie Manna, MD      Brief Narrative:    Medical records reviewed and are as summarized below:  Jeremy [name] is a 68 y.o. male  with medical history significant for homelessness, coronary artery disease status post CABG, chronic HFrEF, type 2 diabetes mellitus, hypertension, CKD 3A, and atrial fibrillation previously on amiodarone  and Xarelto ,  who presented to the hospital on 12/23/2023 with chest pain, shortness of breath and palpitations.  He said he had presented to the ED the day prior but did not stay long enough to be seen by a physician.  He is homeless and has not taken his medications for months because he has no way of obtaining refills.  He has not been able to follow-up with his cardiologist at [facility] in [place].  He was found to have atrial flutter with rapid ventricular response with heart rate in the 130s to 150s.  Heart rate came down on its own prior to receiving any IV Cardizem .      Assessment/Plan:   Principal Problem:   Atrial flutter with rapid ventricular response (HCC) Active Problems:   Essential hypertension   Stage 3 chronic kidney disease (HCC)   Polysubstance abuse (HCC)   Type 2 diabetes mellitus with chronic kidney disease, with long-term current use of insulin  (HCC)     Body mass index is 30.68 kg/m.  (Class I obesity)   Paroxysmal atrial fibrillation and atrial flutter with RVR: Heart rate is better.  Continue amiodarone , carvedilol  and Xarelto . Chest pain, shortness of breath and palpitations have improved   CAD s/p CABG: Continue aspirin  and Lipitor   Chronic HFrEF: Compensated.  Continue Entresto  and carvedilol . 2D echo in January 2025 showed EF estimated at 35 to 40% indeterminate LV diastolic parameters, mild to moderate aortic valve regurgitation, mild to moderately reduced RV systolic function. He saw Dr. Florencio,  his primary cardiologist, on 11/18/2023   Homelessness: Consult TOC to assist with placement   Polysubstance use disorder: Urine drug screen on 12/23/2023 positive for cocaine, tetrahydrocannabinol.   Comorbidities include type II DM, CKD stage IIIa, hypertension, upper lipidemia, anemia of chronic disease, chronic right ankle pain   Diet Order             Diet regular Room service appropriate? Yes; Fluid consistency: Thin; Fluid restriction: 2000 mL Fluid  Diet effective now                            Consultants: None  Procedures: None    Medications:    amiodarone   200 mg Oral Daily   aspirin   81 mg Oral Daily   atorvastatin   40 mg Oral Daily   carvedilol   3.125 mg Oral BID   metFORMIN   1,000 mg Oral Q breakfast   pantoprazole   40 mg Oral BID   sacubitril -valsartan   1 tablet Oral Q12H   senna-docusate  1 tablet Oral BID   Continuous Infusions:   Anti-infectives (From admission, onward)    None              Family Communication/Anticipated D/C date and plan/Code Status   DVT prophylaxis:      Code Status: Full Code  Family Communication: None Disposition Plan: Plan to discharge home   Status is: Inpatient Remains inpatient appropriate because: No safe discharge plan       Subjective:  Interval events noted.  Breathing is a little better today.  No chest pain or palpitations.  Objective:    Vitals:   12/23/23 2215 12/24/23 0328 12/24/23 0900 12/24/23 1425  BP: (!) 153/79 (!) 142/71 (!) 121/56 122/69  Pulse: 93 68 99   Resp: (!) 22 18 18 16   Temp:  98.7 F (37.1 C)  97.6 F (36.4 C)  TempSrc:    Oral  SpO2: 100% 100% 100%   Weight:      Height:       No data found.  No intake or output data in the 24 hours ending 12/24/23 1515 Filed Weights   12/23/23 1726  Weight: 99.8 kg    Exam:  GEN: NAD SKIN: Warm and dry EYES: No pallor or icterus ENT: MMM CV: Irregular rate and rhythm, PULM: CTA B ABD:  soft, ND, NT, +BS CNS: AAO x 3, non focal EXT: No edema or tenderness        Data Reviewed:   I have personally reviewed following labs and imaging studies:  Labs: Labs show the following:   Basic Metabolic Panel: Recent Labs  Lab 12/23/23 1803 12/24/23 0503  NA 139 139  K 4.3 4.0  CL 110 112*  CO2 18* 18*  GLUCOSE 89 91  BUN 15 17  CREATININE 1.38* 1.44*  CALCIUM  9.5 9.0  MG 2.1  --    GFR Estimated Creatinine Clearance: 60.7 mL/min (A) (by C-G formula based on SCr of 1.44 mg/dL (H)). Liver Function Tests: Recent Labs  Lab 12/23/23 1803  AST 12*  ALT 7  ALKPHOS 48  BILITOT 0.5  PROT 7.2  ALBUMIN 3.6   No results for input(s): LIPASE, AMYLASE in the last 168 hours. No results for input(s): AMMONIA in the last 168 hours. Coagulation profile Recent Labs  Lab 12/23/23 2031  INR 1.1    CBC: Recent Labs  Lab 12/23/23 1803 12/24/23 0503  WBC 5.4 5.9  NEUTROABS 1.9  --   HGB 13.9 13.4  HCT 42.2 42.2  MCV 92.3 94.6  PLT 305 267   Cardiac Enzymes: No results for input(s): CKTOTAL, CKMB, CKMBINDEX, TROPONINI in the last 168 hours. BNP (last 3 results) No results for input(s): PROBNP in the last 8760 hours. CBG: Recent Labs  Lab 12/20/23 1514 12/23/23 1801  GLUCAP 128* 106*   D-Dimer: No results for input(s): DDIMER in the last 72 hours. Hgb A1c: No results for input(s): HGBA1C in the last 72 hours. Lipid Profile: No results for input(s): CHOL, HDL, LDLCALC, TRIG, CHOLHDL, LDLDIRECT in the last 72 hours. Thyroid  function studies: No results for input(s): TSH, T4TOTAL, T3FREE, THYROIDAB in the last 72 hours.  Invalid input(s): FREET3 Anemia work up: No results for input(s): VITAMINB12, FOLATE, FERRITIN, TIBC, IRON, RETICCTPCT in the last 72 hours. Sepsis Labs: Recent Labs  Lab 12/23/23 1803 12/24/23 0503  WBC 5.4 5.9    Microbiology No results found for this or any previous visit  (from the past 240 hours).  Procedures and diagnostic studies:  DG Chest Port 1 View Result Date: 12/23/2023 CLINICAL DATA:  sob, cp EXAM: PORTABLE CHEST - 1 VIEW COMPARISON:  Oct 25, 2023 FINDINGS: Lower lung volumes. No focal airspace consolidation, pleural effusion, or pneumothorax. Mild cardiomegaly. Sternotomy wires and CABG markers. Tortuous aorta with aortic atherosclerosis. No acute fracture or destructive lesion. Multilevel thoracic osteophytosis. Bone-on-bone articulation of the left glenohumeral joint. IMPRESSION: No acute cardiopulmonary abnormality. Electronically Signed   By: Rogelia Myers M.D.   On:  12/23/2023 19:10               LOS: 1 day   Doreather Hoxworth  [facility]   Pager on [email or web address]. If 7PM-7AM, please contact night-coverage at [email or web address]     12/24/2023, 3:15 PM

## 2023-12-24 NOTE — Plan of Care (Signed)
  Problem: Education: Goal: Knowledge of General Education information will improve Description: Including pain rating scale, medication(s)/side effects and non-pharmacologic comfort measures Outcome: Progressing   Problem: Health Behavior/Discharge Planning: Goal: Ability to manage health-related needs will improve Outcome: Progressing   Problem: Clinical Measurements: Goal: Will remain free from infection Outcome: Progressing Goal: Respiratory complications will improve Outcome: Progressing Goal: Cardiovascular complication will be avoided Outcome: Progressing   Problem: Nutrition: Goal: Adequate nutrition will be maintained Outcome: Progressing   Problem: Coping: Goal: Level of anxiety will decrease Outcome: Progressing   Problem: Elimination: Goal: Will not experience complications related to bowel motility Outcome: Progressing   Problem: Pain Managment: Goal: General experience of comfort will improve and/or be controlled Outcome: Progressing   Problem: Safety: Goal: Ability to remain free from injury will improve Outcome: Progressing   Problem: Clinical Measurements: Goal: Ability to maintain clinical measurements within normal limits will improve Outcome: Not Met (add Reason)

## 2023-12-25 DIAGNOSIS — I4892 Unspecified atrial flutter: Secondary | ICD-10-CM | POA: Diagnosis not present

## 2023-12-25 NOTE — Plan of Care (Signed)

## 2023-12-25 NOTE — Progress Notes (Signed)
 Progress Note    Jeremy [name]  FMW:[id number] DOB: 03-05-1956  DOA: 12/23/2023 PCP: Jeremy Manna, MD      Brief Narrative:    Medical records reviewed and are as summarized below:  Jeremy [name] is a 68 y.o. male  with medical history significant for homelessness, coronary artery disease status post CABG, chronic HFrEF, type 2 diabetes mellitus, hypertension, CKD 3A, and atrial fibrillation previously on amiodarone  and Xarelto ,  who presented to the hospital on 12/23/2023 with chest pain, shortness of breath and palpitations.  He said he had presented to the ED the day prior but did not stay long enough to be seen by a physician.  He is homeless and has not taken his medications for months because he has no way of obtaining refills.  He has not been able to follow-up with his cardiologist at [facility] in [place].  He was found to have atrial flutter with rapid ventricular response with heart rate in the 130s to 150s.  Heart rate came down on its own prior to receiving any IV Cardizem .      Assessment/Plan:   Principal Problem:   Atrial flutter with rapid ventricular response (HCC) Active Problems:   Essential hypertension   Stage 3 chronic kidney disease (HCC)   Polysubstance abuse (HCC)   Type 2 diabetes mellitus with chronic kidney disease, with long-term current use of insulin  (HCC)     Body mass index is 30.68 kg/m.  (Class I obesity)   Paroxysmal atrial fibrillation and atrial flutter with RVR: Heart rate is better.  Continue amiodarone , carvedilol  and Xarelto . Chest pain, shortness of breath and palpitations have improved   CAD s/p CABG: Continue aspirin  and Lipitor   Chronic HFrEF: Compensated.  Continue Entresto  and carvedilol . 2D echo in January 2025 showed EF estimated at 35 to 40% indeterminate LV diastolic parameters, mild to moderate aortic valve regurgitation, mild to moderately reduced RV systolic function. He saw Dr. Florencio,  his primary cardiologist, on 11/18/2023   Homelessness: Transition of care team has been consulted to assist with disposition.   Polysubstance use disorder: Urine drug screen on 12/23/2023 positive for cocaine, tetrahydrocannabinol.   Comorbidities include type II DM, CKD stage IIIa, hypertension, hyperlipidemia, anemia of chronic disease, chronic right ankle pain   Diet Order             Diet regular Room service appropriate? Yes; Fluid consistency: Thin; Fluid restriction: 2000 mL Fluid  Diet effective now                            Consultants: None  Procedures: None    Medications:    amiodarone   200 mg Oral Daily   aspirin   81 mg Oral Daily   atorvastatin   40 mg Oral Daily   carvedilol   3.125 mg Oral BID   metFORMIN   1,000 mg Oral Q breakfast   pantoprazole   40 mg Oral BID   rivaroxaban   20 mg Oral Q supper   sacubitril -valsartan   1 tablet Oral Q12H   senna-docusate  1 tablet Oral BID   Continuous Infusions:   Anti-infectives (From admission, onward)    None              Family Communication/Anticipated D/C date and plan/Code Status   DVT prophylaxis:  rivaroxaban  (XARELTO ) tablet 20 mg     Code Status: Full Code  Family Communication: None Disposition Plan: Plan to discharge home  Status is: Inpatient Remains inpatient appropriate because: No safe discharge plan       Subjective:   Interval events noted.  He has no complaints.  He feels better today.  No shortness of breath or chest pain.  However, he said he is homeless and has nowhere to go.  Objective:    Vitals:   12/24/23 2006 12/24/23 2326 12/25/23 0625 12/25/23 0736  BP: 128/75 116/61 116/77 119/61  Pulse: 63 62 (!) 57 62  Resp: 18 20 18 18   Temp: 98.8 F (37.1 C) (!) 97.5 F (36.4 C) 97.7 F (36.5 C) 98 F (36.7 C)  TempSrc: Oral Oral Oral Oral  SpO2: 99% 98% 92% 99%  Weight:      Height:       No data found.   Intake/Output Summary (Last 24  hours) at 12/25/2023 1139 Last data filed at 12/25/2023 1127 Gross per 24 hour  Intake 598 ml  Output 655 ml  Net -57 ml   Filed Weights   12/23/23 1726  Weight: 99.8 kg    Exam:   GEN: No acute distress SKIN: Warm and dry EYES: No pallor or icterus ENT: MMM CV: Irregular rate and rhythm PULM: CTA B ABD: soft, ND, NT, +BS CNS: AAO x 3, non focal EXT: No edema or tenderness       Data Reviewed:   I have personally reviewed following labs and imaging studies:  Labs: Labs show the following:   Basic Metabolic Panel: Recent Labs  Lab 12/23/23 1803 12/24/23 0503  NA 139 139  K 4.3 4.0  CL 110 112*  CO2 18* 18*  GLUCOSE 89 91  BUN 15 17  CREATININE 1.38* 1.44*  CALCIUM  9.5 9.0  MG 2.1  --    GFR Estimated Creatinine Clearance: 60.7 mL/min (A) (by C-G formula based on SCr of 1.44 mg/dL (H)). Liver Function Tests: Recent Labs  Lab 12/23/23 1803  AST 12*  ALT 7  ALKPHOS 48  BILITOT 0.5  PROT 7.2  ALBUMIN 3.6   No results for input(s): LIPASE, AMYLASE in the last 168 hours. No results for input(s): AMMONIA in the last 168 hours. Coagulation profile Recent Labs  Lab 12/23/23 2031  INR 1.1    CBC: Recent Labs  Lab 12/23/23 1803 12/24/23 0503  WBC 5.4 5.9  NEUTROABS 1.9  --   HGB 13.9 13.4  HCT 42.2 42.2  MCV 92.3 94.6  PLT 305 267   Cardiac Enzymes: No results for input(s): CKTOTAL, CKMB, CKMBINDEX, TROPONINI in the last 168 hours. BNP (last 3 results) No results for input(s): PROBNP in the last 8760 hours. CBG: Recent Labs  Lab 12/20/23 1514 12/23/23 1801  GLUCAP 128* 106*   D-Dimer: No results for input(s): DDIMER in the last 72 hours. Hgb A1c: No results for input(s): HGBA1C in the last 72 hours. Lipid Profile: No results for input(s): CHOL, HDL, LDLCALC, TRIG, CHOLHDL, LDLDIRECT in the last 72 hours. Thyroid  function studies: No results for input(s): TSH, T4TOTAL, T3FREE, THYROIDAB in  the last 72 hours.  Invalid input(s): FREET3 Anemia work up: No results for input(s): VITAMINB12, FOLATE, FERRITIN, TIBC, IRON, RETICCTPCT in the last 72 hours. Sepsis Labs: Recent Labs  Lab 12/23/23 1803 12/24/23 0503  WBC 5.4 5.9    Microbiology No results found for this or any previous visit (from the past 240 hours).  Procedures and diagnostic studies:  DG Chest Port 1 View Result Date: 12/23/2023 CLINICAL DATA:  sob, cp EXAM: PORTABLE CHEST -  1 VIEW COMPARISON:  Oct 25, 2023 FINDINGS: Lower lung volumes. No focal airspace consolidation, pleural effusion, or pneumothorax. Mild cardiomegaly. Sternotomy wires and CABG markers. Tortuous aorta with aortic atherosclerosis. No acute fracture or destructive lesion. Multilevel thoracic osteophytosis. Bone-on-bone articulation of the left glenohumeral joint. IMPRESSION: No acute cardiopulmonary abnormality. Electronically Signed   By: Rogelia Myers M.D.   On: 12/23/2023 19:10               LOS: 2 days   Iyonnah Ferrante  [facility]   Pager on [email or web address]. If 7PM-7AM, please contact night-coverage at [email or web address]     12/25/2023, 11:39 AM

## 2023-12-26 DIAGNOSIS — I4892 Unspecified atrial flutter: Secondary | ICD-10-CM | POA: Diagnosis not present

## 2023-12-26 NOTE — Progress Notes (Signed)
 PROGRESS NOTE    Jeremy [name]  FMW:[id number]  DOB: Nov 01, 1955  DOA: 12/23/2023 PCP: Sadie Manna, MD Outpatient Specialists:   Hospital course:  68 y.o. male  with medical history significant for homelessness, coronary artery disease status post CABG, chronic HFrEF, type 2 diabetes mellitus, hypertension, CKD 3A, and atrial fibrillation previously on amiodarone  and Xarelto ,  who presented to the hospital on 12/23/2023 with chest pain, shortness of breath and palpitations.  Patient was noted to be in a flutter with RVR.  Of note patient had not been on any of his medications due to inability to refill them.  Patient was restarted on his medications.  Subjective:  Patient states he is tired.  Is glad to be back on his medications.  Notes he has no way of refilling his medications.   Objective: Vitals:   12/25/23 2325 12/26/23 0545 12/26/23 0735 12/26/23 1134  BP: (!) 130/90 133/84 132/68 122/64  Pulse: 75 71 (!) 59 82  Resp: 18 16 16 16   Temp: 97.7 F (36.5 C) 97.7 F (36.5 C) 98 F (36.7 C) 97.6 F (36.4 C)  TempSrc: Oral Oral Oral Axillary  SpO2: 97% 98% 99%   Weight:      Height:        Intake/Output Summary (Last 24 hours) at 12/26/2023 1552 Last data filed at 12/26/2023 1453 Gross per 24 hour  Intake 960 ml  Output 1680 ml  Net -720 ml   Filed Weights   12/23/23 1726  Weight: 99.8 kg     Exam:  General: Tired appearing man lying in bed at 20 degrees in NAD Eyes: sclera anicteric, conjuctiva mild injection bilaterally CVS: S1-S2, regular  Respiratory:  decreased air entry bilaterally secondary to decreased inspiratory effort, rales at bases  GI: NABS, soft, NT  LE: Warm and well-perfused Neuro: A/O x 3,  grossly nonfocal  Data Reviewed:  Basic Metabolic Panel: Recent Labs  Lab 12/23/23 1803 12/24/23 0503  NA 139 139  K 4.3 4.0  CL 110 112*  CO2 18* 18*  GLUCOSE 89 91  BUN 15 17  CREATININE 1.38* 1.44*  CALCIUM  9.5 9.0  MG 2.1  --      CBC: Recent Labs  Lab 12/23/23 1803 12/24/23 0503  WBC 5.4 5.9  NEUTROABS 1.9  --   HGB 13.9 13.4  HCT 42.2 42.2  MCV 92.3 94.6  PLT 305 267     Scheduled Meds:  amiodarone   200 mg Oral Daily   aspirin   81 mg Oral Daily   atorvastatin   40 mg Oral Daily   carvedilol   3.125 mg Oral BID   metFORMIN   1,000 mg Oral Q breakfast   pantoprazole   40 mg Oral BID   rivaroxaban   20 mg Oral Q supper   sacubitril -valsartan   1 tablet Oral Q12H   senna-docusate  1 tablet Oral BID   Continuous Infusions:   Assessment & Plan:   PAF/atrial flutter with RVR Palpitations Shortness of breath HFrEF HTN Echo in January 2025 showed EF of 35 to 40% Chest x-ray without any abnormalities Heart rate improved with reinitiation of amiodarone , Entresto  and carvedilol  Will need to find a way to assist patient with his medications as much as possible.  Polysubstance use UDS positive for cocaine and THC Patient counseled on the effects of cocaine on his heart and heart rate  DM 2 Good control on present management  CKD 3 AA Creatinine is at baseline   DVT prophylaxis: Xarelto  Code Status: Full Family  Communication: None today     Studies: No results found.  Principal Problem:   Atrial flutter with rapid ventricular response (HCC) Active Problems:   Essential hypertension   Stage 3 chronic kidney disease (HCC)   Polysubstance abuse (HCC)   Type 2 diabetes mellitus with chronic kidney disease, with long-term current use of insulin  (HCC)     Jeremy [name] Jeremy [name], [facility]  If 7PM-7AM, please contact night-coverage [email or web address]   LOS: 3 days

## 2023-12-26 NOTE — TOC Initial Note (Signed)
 Transition of Care [facility]) - Initial/Assessment Note    Patient Details  Name: Jeremy [name] MRN: [id number] Date of Birth: 02-11-1956  Transition of [facility]) CM/SW Contact:    Isaiah Public, LCSWA Phone Number: 12/26/2023, 2:01 PM  Clinical Narrative:                  CSW spoke with patient. Patient reports he is homeless. CSW offered patient outpatient substance use treatment services resources,shelter resources,[place] assistance programs, and [employer]. All questions answered. Patient reports his dc plan TBD he is going to work on either getting a shelter bed or will plan on going to outpatient substance use treatment program once discharged.No further questions reported at this time.   Barriers to Discharge: Continued Medical Work up   Patient Goals and CMS Choice Patient states their goals for this hospitalization and ongoing recovery are:: to either go to shelter or outpatient substance use treatment program          Expected Discharge Plan and Services In-house Referral: Clinical Social Work     Living arrangements for the past 2 months: Homeless                                      Prior Living Arrangements/Services Living arrangements for the past 2 months: Homeless   Patient language and need for interpreter reviewed:: Yes        Need for Family Participation in Patient Care: Yes (Comment) Care giver support system in place?: Yes (comment)   Criminal Activity/Legal Involvement Pertinent to Current Situation/Hospitalization: No - Comment as needed  Activities of Daily Living   ADL Screening (condition at time of admission) Independently performs ADLs?: Yes (appropriate for developmental age) Is the patient deaf or have difficulty hearing?: No Does the patient have difficulty seeing, even when wearing glasses/contacts?: No Does the patient have difficulty concentrating, remembering, or making decisions?:  No  Permission Sought/Granted Permission sought to share information with : Case Manager, Magazine features editor, Family Supports                Emotional Assessment   Attitude/Demeanor/Rapport: Gracious Affect (typically observed): Calm Orientation: : Oriented to Self, Oriented to Place, Oriented to  Time, Oriented to Situation Alcohol  / Substance Use: Illicit Drugs Psych Involvement: No (comment)  Admission diagnosis:  Unstable angina (HCC) [I20.0] Atrial flutter with rapid ventricular response (HCC) [I48.92] Non compliance w medication regimen [Z91.148] Typical atrial flutter (HCC) [I48.3] Patient Active Problem List   Diagnosis Date Noted   Atrial flutter with rapid ventricular response (HCC) 12/23/2023   UTI due to extended-spectrum beta lactamase (ESBL) producing Escherichia coli 11/02/2023   Charcot joint of right ankle 10/28/2023   UTI (urinary tract infection) 10/27/2023   Cystitis 10/26/2023   Acute metabolic encephalopathy 08/07/2023   Sepsis due to gram-negative UTI (HCC) 08/07/2023   Dyslipidemia 08/07/2023   Paroxysmal atrial fibrillation (HCC) 08/07/2023   GERD without esophagitis 08/07/2023   Coronary artery disease 08/07/2023   Type 2 diabetes mellitus with chronic kidney disease, with long-term current use of insulin  (HCC) 08/07/2023   Sepsis (HCC) 08/07/2023   Septic shock (HCC) 07/31/2023   COPD with acute exacerbation (HCC) 06/26/2023   Afib (HCC) 06/26/2023   Adjustment disorder with depressed mood 03/14/2023   Acute ST elevation myocardial infarction (STEMI) of posterior wall (HCC) 11/24/2022   HFrEF (heart failure with reduced ejection  fraction) (HCC) 11/24/2022   PAF (paroxysmal atrial fibrillation) (HCC) 11/24/2022   OSA (obstructive sleep apnea) 11/24/2022   Coronary artery disease involving native coronary artery of native heart 11/24/2022   Polysubstance abuse (HCC) 11/24/2022   Hyperlipidemia 11/24/2022   Aortic atherosclerosis  (HCC) 07/16/2021   Obesity (BMI 30-39.9) 07/16/2021   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/2021   Stage 3 chronic kidney disease (HCC) 07/16/2021   On mechanically assisted ventilation (HCC) 12/28/2020   Atrial fibrillation with RVR (HCC) 12/28/2020   Acute delirium 12/28/2020   Tobacco abuse 12/28/2020   Diabetes mellitus, type 2 (HCC) 12/28/2020   NSTEMI (non-ST elevated myocardial infarction) (HCC)    Acute respiratory failure with hypoxia (HCC)    Acute pulmonary embolism (HCC) 12/22/2020   History of cocaine use 11/26/2019   History of marijuana use 11/26/2019   History of illicit drug use 11/26/2019   Pharmacologic therapy 11/26/2019   Acute pain (now resolved) 11/26/2019   MRSA (methicillin resistant Staphylococcus aureus) infection 10/18/2019   Hyperosmolar non-ketotic state due to type 2 diabetes mellitus (HCC) 08/21/2019   Hyponatremia 08/19/2019   AKI (acute kidney injury) (HCC) 08/19/2019   Essential hypertension 08/19/2019   Medically noncompliant 08/19/2019   Post-traumatic osteoarthritis of right ankle 10/23/2018   Skin ulcer of right ankle with fat layer exposed (HCC) 10/23/2018   Status post ORIF of fracture of ankle 10/23/2018   Diabetic foot infection (HCC) 10/22/2018   Pressure injury of skin 02/11/2018   PCP:  Sadie Manna, MD Pharmacy:   [facility] [address] [place] - [address] [place] [address] Phone: [phone] Fax: [phone]  [facility] [address] [place] [address] Phone: [phone] Fax: [phone]  [facility] [address] [place] [address] Phone: [phone] Fax: [phone]  [facility] [address] [place] [address] Phone: [phone] Fax: [phone]     Social Drivers of Health (SDOH) Social History: SDOH Screenings   Food Insecurity: Food Insecurity Present  (12/25/2023)  Housing: High Risk (12/25/2023)  Transportation Needs: Unmet Transportation Needs (12/25/2023)  Utilities: Patient Declined (12/25/2023)  Depression (PHQ2-9): Low Risk  (10/30/2018)  Financial Resource Strain: Low Risk  (06/03/2023)   Received from [facility]  Recent Concern: Financial Resource Strain - High Risk (03/22/2023)   Received from [facility]  Social Connections: Unknown (12/25/2023)  Tobacco Use: High Risk (12/23/2023)   SDOH Interventions:     Readmission Risk Interventions    10/28/2023   10:12 AM 08/09/2023    2:42 PM  Readmission Risk Prevention Plan  Transportation Screening Complete Complete  Medication Review (RN Care Manager) Complete   PCP or Specialist appointment within 3-5 days of discharge Complete   HRI or Home Care Consult Complete --  SW Recovery Care/Counseling Consult Complete   Palliative Care Screening Not Applicable Not Applicable  Skilled Nursing Facility Complete Not Applicable

## 2023-12-27 ENCOUNTER — Other Ambulatory Visit (HOSPITAL_COMMUNITY): Payer: Self-pay

## 2023-12-27 DIAGNOSIS — I4892 Unspecified atrial flutter: Secondary | ICD-10-CM | POA: Diagnosis not present

## 2023-12-27 MED ORDER — NITROGLYCERIN 0.4 MG SL SUBL
0.4000 mg | SUBLINGUAL_TABLET | SUBLINGUAL | 0 refills | Status: DC | PRN
  Filled 2023-12-27: qty 25, 8d supply, fill #0

## 2023-12-27 MED ORDER — METFORMIN HCL 1000 MG PO TABS
1000.0000 mg | ORAL_TABLET | Freq: Every day | ORAL | 0 refills | Status: DC
  Filled 2023-12-27 – 2023-12-28 (×2): qty 90, 90d supply, fill #0

## 2023-12-27 MED ORDER — SACUBITRIL-VALSARTAN 24-26 MG PO TABS
1.0000 | ORAL_TABLET | Freq: Two times a day (BID) | ORAL | 3 refills | Status: DC
  Filled 2023-12-27: qty 180, 90d supply, fill #0

## 2023-12-27 MED ORDER — ASPIRIN 81 MG PO CHEW
81.0000 mg | CHEWABLE_TABLET | Freq: Every day | ORAL | 0 refills | Status: DC
  Filled 2023-12-27: qty 120, 120d supply, fill #0

## 2023-12-27 MED ORDER — ATORVASTATIN CALCIUM 40 MG PO TABS
40.0000 mg | ORAL_TABLET | Freq: Every day | ORAL | 0 refills | Status: DC
  Filled 2023-12-27 – 2023-12-28 (×2): qty 90, 90d supply, fill #0

## 2023-12-27 MED ORDER — AMIODARONE HCL 200 MG PO TABS
200.0000 mg | ORAL_TABLET | Freq: Every day | ORAL | 0 refills | Status: DC
  Filled 2023-12-27 – 2023-12-28 (×2): qty 90, 90d supply, fill #0

## 2023-12-27 MED ORDER — PANTOPRAZOLE SODIUM 20 MG PO TBEC
20.0000 mg | DELAYED_RELEASE_TABLET | Freq: Every day | ORAL | 0 refills | Status: DC
  Filled 2023-12-27: qty 30, 30d supply, fill #0

## 2023-12-27 MED ORDER — CARVEDILOL 3.125 MG PO TABS
3.1250 mg | ORAL_TABLET | Freq: Two times a day (BID) | ORAL | 0 refills | Status: DC
  Filled 2023-12-27 – 2023-12-28 (×2): qty 180, 90d supply, fill #0

## 2023-12-27 MED ORDER — ASPIRIN 81 MG PO CHEW
81.0000 mg | CHEWABLE_TABLET | Freq: Every day | ORAL | 3 refills | Status: DC
Start: 2023-12-27 — End: 2023-12-27
  Filled 2023-12-27: qty 30, 30d supply, fill #0

## 2023-12-27 MED ORDER — RIVAROXABAN 20 MG PO TABS
20.0000 mg | ORAL_TABLET | Freq: Every day | ORAL | 0 refills | Status: DC
  Filled 2023-12-27 – 2023-12-28 (×2): qty 90, 90d supply, fill #0

## 2023-12-27 NOTE — TOC Progression Note (Signed)
 Transition of Care [facility]) - Progression Note    Patient Details  Name: Jeremy [name] MRN: [id number] Date of Birth: Oct 09, 1955  Transition [facility]) CM/SW Contact  Isaiah Public, LCSWA Phone Number: 12/27/2023, 10:58 AM  Clinical Narrative:     CSW spoke with patient at bedside. Patient has meeting with N[facility] at  [facility] [address] [place], [place] [address] at 12:30pm. Patient will need transportation there. Discharge lounge will provide cab voucher to location. All questions answered. No further questions reported at this time.     Barriers to Discharge: Continued Medical Work up  Expected Discharge Plan and Services In-house Referral: Clinical Social Work     Living arrangements for the past 2 months: Homeless Expected Discharge Date: 12/27/23                                     Social Determinants of Health (SDOH) Interventions SDOH Screenings   Food Insecurity: Food Insecurity Present (12/25/2023)  Housing: High Risk (12/25/2023)  Transportation Needs: Unmet Transportation Needs (12/25/2023)  Utilities: Patient Declined (12/25/2023)  Depression (PHQ2-9): Low Risk  (10/30/2018)  Financial Resource Strain: Low Risk  (06/03/2023)   Received from [facility]  Recent Concern: Financial Resource Strain - High Risk (03/22/2023)   Received from [facility]  Social Connections: Unknown (12/25/2023)  Tobacco Use: High Risk (12/23/2023)    Readmission Risk Interventions    10/28/2023   10:12 AM 08/09/2023    2:42 PM  Readmission Risk Prevention Plan  Transportation Screening Complete Complete  Medication Review (RN Care Manager) Complete   PCP or Specialist appointment within 3-5 days of discharge Complete   HRI or Home Care Consult Complete --  SW Recovery Care/Counseling Consult Complete   Palliative Care Screening Not Applicable Not Applicable  Skilled Nursing Facility Complete Not Applicable

## 2023-12-27 NOTE — Plan of Care (Signed)
  Problem: Education: Goal: Knowledge of General Education information will improve Description: Including pain rating scale, medication(s)/side effects and non-pharmacologic comfort measures Outcome: Adequate for Discharge   Problem: Health Behavior/Discharge Planning: Goal: Ability to manage health-related needs will improve Outcome: Adequate for Discharge   Problem: Clinical Measurements: Goal: Ability to maintain clinical measurements within normal limits will improve Outcome: Adequate for Discharge Goal: Will remain free from infection Outcome: Adequate for Discharge Goal: Diagnostic test results will improve Outcome: Adequate for Discharge Goal: Respiratory complications will improve Outcome: Adequate for Discharge Goal: Cardiovascular complication will be avoided Outcome: Adequate for Discharge   Problem: Activity: Goal: Risk for activity intolerance will decrease Outcome: Adequate for Discharge   Problem: Nutrition: Goal: Adequate nutrition will be maintained Outcome: Adequate for Discharge   Problem: Elimination: Goal: Will not experience complications related to bowel motility Outcome: Adequate for Discharge Goal: Will not experience complications related to urinary retention Outcome: Adequate for Discharge   Problem: Pain Managment: Goal: General experience of comfort will improve and/or be controlled Outcome: Adequate for Discharge   Problem: Safety: Goal: Ability to remain free from injury will improve Outcome: Adequate for Discharge   Problem: Skin Integrity: Goal: Risk for impaired skin integrity will decrease Outcome: Adequate for Discharge
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
## 2023-12-27 NOTE — TOC Transition Note (Signed)
 Transition of Care [facility]) - Discharge Note   Patient Details  Name: Jeremy [name] MRN: [id number] Date of Birth: 1955-11-21  [facility]) CM/SW Contact:  Isaiah Public, LCSWA Phone Number: 12/27/2023, 11:37 AM   Clinical Narrative:     [facility]   Anticipated DC date: 12/27/2023  Transport by: Cab   CSW signing off.     Barriers to Discharge: Continued Medical Work up   Patient Goals and CMS Choice Patient states their goals for this hospitalization and ongoing recovery are:: to either go to shelter or outpatient substance use treatment program          Discharge Placement                       Discharge Plan and Services Additional resources added to the After Visit Summary for   In-house Referral: Clinical Social Work                                   Social Drivers of Health (SDOH) Interventions SDOH Screenings   Food Insecurity: Food Insecurity Present (12/25/2023)  Housing: High Risk (12/25/2023)  Transportation Needs: Unmet Transportation Needs (12/25/2023)  Utilities: Patient Declined (12/25/2023)  Depression (PHQ2-9): Low Risk  (10/30/2018)  Financial Resource Strain: Low Risk  (06/03/2023)   Received from [facility]  Recent Concern: Financial Resource Strain - High Risk (03/22/2023)   Received from [facility]  Social Connections: Unknown (12/25/2023)  Tobacco Use: High Risk (12/23/2023)     Readmission Risk Interventions    10/28/2023   10:12 AM 08/09/2023    2:42 PM  Readmission Risk Prevention Plan  Transportation Screening Complete Complete  Medication Review (RN Care Manager) Complete   PCP or Specialist appointment within 3-5 days of discharge Complete   HRI or Home Care Consult Complete --  SW Recovery Care/Counseling Consult Complete   Palliative Care Screening Not Applicable Not Applicable  Skilled Nursing Facility Complete Not  Applicable

## 2023-12-28 ENCOUNTER — Encounter: Payer: Self-pay | Admitting: *Deleted

## 2023-12-28 ENCOUNTER — Other Ambulatory Visit (HOSPITAL_COMMUNITY): Payer: Self-pay

## 2023-12-28 NOTE — Congregational Nurse Program (Signed)
  Dept: [phone]   Congregational Nurse Program Note  Date of Encounter: 12/28/2023  Past Medical History: Past Medical History:  Diagnosis Date   CHF (congestive heart failure) (HCC)    Coronary artery disease    Diabetes mellitus without complication (HCC)    Hypertension    S/P CABG x 1     Encounter Details:  Community Questionnaire - 12/28/23 1235       Questionnaire   Ask client: Do you give verbal consent for me to treat you today? Yes    Student Assistance N/A    Location Patient Served  [facility]    Encounter Setting CN site;Phone/Text/Email    Population Status [employer];Medicare    Insurance/Financial Assistance Referral N/A    Medication Have Medication Insecurities;Provided Medication Assistance    Medical Provider Yes    Screening Referrals Made N/A    Medical Referrals Made N/A    Medical Appointment Completed N/A    CNP Interventions Advocate/Support    Screenings CN Performed Blood Pressure    ED Visit Averted N/A    Life-Saving Intervention Made N/A         Client was admitted to [facility] shelter today and signed sheet to see Clinical research associate. He wanted to review his medications from recent hospital stay. Client had four medications and a paper with a list of medications. Contacted [facility] hospital second floor from number on list. Client had medication to be filled at pharmacy and would be ready today. Gave client bus passes for transportation to pick up medication. He plans to return to nurse tomorrow for assistance with pill container and cbg check. CBG was not checked today as he was eating candy while in nurse's office. Checked vitals. Blood pressure 112/75, pulse 86, SpO2 96%. Offered support and encouragement. Sandon Yoho W RN CN

## 2023-12-31 ENCOUNTER — Encounter (HOSPITAL_COMMUNITY): Payer: Self-pay

## 2023-12-31 ENCOUNTER — Emergency Department (HOSPITAL_COMMUNITY)

## 2023-12-31 ENCOUNTER — Other Ambulatory Visit: Payer: Self-pay

## 2023-12-31 ENCOUNTER — Emergency Department (HOSPITAL_COMMUNITY)
Admission: EM | Admit: 2023-12-31 | Discharge: 2023-12-31 | Disposition: A | Discharge status: Home / Self Care | Attending: Emergency Medicine | Admitting: Emergency Medicine

## 2023-12-31 DIAGNOSIS — E119 Type 2 diabetes mellitus without complications: Secondary | ICD-10-CM | POA: Diagnosis not present

## 2023-12-31 DIAGNOSIS — Z951 Presence of aortocoronary bypass graft: Secondary | ICD-10-CM | POA: Diagnosis not present

## 2023-12-31 DIAGNOSIS — Z7901 Long term (current) use of anticoagulants: Secondary | ICD-10-CM | POA: Insufficient documentation

## 2023-12-31 DIAGNOSIS — Z7982 Long term (current) use of aspirin: Secondary | ICD-10-CM | POA: Diagnosis not present

## 2023-12-31 DIAGNOSIS — I110 Hypertensive heart disease with heart failure: Secondary | ICD-10-CM | POA: Insufficient documentation

## 2023-12-31 DIAGNOSIS — I2510 Atherosclerotic heart disease of native coronary artery without angina pectoris: Secondary | ICD-10-CM | POA: Insufficient documentation

## 2023-12-31 DIAGNOSIS — I509 Heart failure, unspecified: Secondary | ICD-10-CM | POA: Diagnosis not present

## 2023-12-31 DIAGNOSIS — Z7984 Long term (current) use of oral hypoglycemic drugs: Secondary | ICD-10-CM | POA: Insufficient documentation

## 2023-12-31 DIAGNOSIS — R0789 Other chest pain: Secondary | ICD-10-CM | POA: Insufficient documentation

## 2023-12-31 DIAGNOSIS — R079 Chest pain, unspecified: Secondary | ICD-10-CM

## 2023-12-31 DIAGNOSIS — Z79899 Other long term (current) drug therapy: Secondary | ICD-10-CM | POA: Insufficient documentation

## 2023-12-31 LAB — CBC
HCT: 41.3 % (ref 39.0–52.0)
Hemoglobin: 13.4 g/dL (ref 13.0–17.0)
MCH: 30.1 pg (ref 26.0–34.0)
MCHC: 32.4 g/dL (ref 30.0–36.0)
MCV: 92.8 fL (ref 80.0–100.0)
Platelets: 263 K/uL (ref 150–400)
RBC: 4.45 MIL/uL (ref 4.22–5.81)
RDW: 16.2 % — ABNORMAL HIGH (ref 11.5–15.5)
WBC: 5.4 K/uL (ref 4.0–10.5)
nRBC: 0 % (ref 0.0–0.2)

## 2023-12-31 LAB — COMPREHENSIVE METABOLIC PANEL WITH GFR
ALT: 11 U/L (ref 0–44)
AST: 15 U/L (ref 15–41)
Albumin: 3.5 g/dL (ref 3.5–5.0)
Alkaline Phosphatase: 86 U/L (ref 38–126)
Anion gap: 8 (ref 5–15)
BUN: 25 mg/dL — ABNORMAL HIGH (ref 8–23)
CO2: 20 mmol/L — ABNORMAL LOW (ref 22–32)
Calcium: 9.3 mg/dL (ref 8.9–10.3)
Chloride: 110 mmol/L (ref 98–111)
Creatinine, Ser: 1.53 mg/dL — ABNORMAL HIGH (ref 0.61–1.24)
GFR, Estimated: 50 mL/min — ABNORMAL LOW (ref >60)
Glucose, Bld: 120 mg/dL — ABNORMAL HIGH (ref 70–99)
Potassium: 4.5 mmol/L (ref 3.5–5.1)
Sodium: 138 mmol/L (ref 135–145)
Total Bilirubin: 0.4 mg/dL (ref 0.0–1.2)
Total Protein: 6.9 g/dL (ref 6.5–8.1)

## 2023-12-31 LAB — TROPONIN I (HIGH SENSITIVITY): Troponin I (High Sensitivity): 11 ng/L (ref <18)

## 2023-12-31 LAB — LIPASE, BLOOD: Lipase: 47 U/L (ref 11–51)

## 2023-12-31 NOTE — ED Provider Notes (Signed)
 [facility] EMERGENCY DEPARTMENT AT [facility] Provider Note   CSN: [id number] Arrival date & time: 12/31/23  1310     Patient presents with: Chest Pain   Jeremy [name] is a 68 y.o. male.  {Add pertinent medical, surgical, social history, OB history to YEP:67052}  Chest Pain Patient presents with chest pain.  Anterior chest.  From [employer].  Reportedly developed pain after eating lunch fried chicken a hot dog.  Not exertional pain.  No fevers.  No coughing.    Past Medical History:  Diagnosis Date   CHF (congestive heart failure) (HCC)    Coronary artery disease    Diabetes mellitus without complication (HCC)    Hypertension    S/P CABG x 1     Prior to Admission medications   Medication Sig Start Date End Date Taking? Authorizing Provider  acetaminophen  (TYLENOL ) 500 MG tablet Take 1 tablet (500 mg total) by mouth every 6 (six) hours as needed. Patient not taking: Reported on 10/27/2023 10/23/23   Mannie Pac T, DO  amiodarone  (PACERONE ) 200 MG tablet Take 1 tablet (200 mg total) by mouth daily. 12/27/23 03/27/24  Raenelle Coria, MD  aspirin  81 MG chewable tablet Chew 1 tablet (81 mg total) by mouth daily. 12/27/23 04/25/24  Raenelle Coria, MD  atorvastatin  (LIPITOR) 40 MG tablet Take 1 tablet (40 mg total) by mouth daily. 12/27/23   Raenelle Coria, MD  carvedilol  (COREG ) 3.125 MG tablet Take 1 tablet (3.125 mg total) by mouth 2 (two) times daily. 12/27/23 03/27/24  Raenelle Coria, MD  metFORMIN  (GLUCOPHAGE ) 1000 MG tablet Take 1 tablet (1,000 mg total) by mouth daily with breakfast. 12/27/23 03/27/24  Raenelle Coria, MD  nitroGLYCERIN  (NITROSTAT ) 0.4 MG SL tablet Place 1 tablet (0.4 mg total) under the tongue every 5 (five) minutes as needed for chest pain up to 3 doses. 12/27/23   Raenelle Coria, MD  pantoprazole  (PROTONIX ) 20 MG tablet Take 1 tablet (20 mg total) by mouth daily. 12/27/23   Raenelle Coria, MD  rivaroxaban  (XARELTO ) 20 MG TABS tablet Take 1  tablet (20 mg total) by mouth daily at 5 pm 12/27/23 03/27/24  Raenelle Coria, MD  sacubitril -valsartan  (ENTRESTO ) 24-26 MG Take 1 tablet by mouth every 12 (twelve) hours. 12/27/23   Ghimire, Kuber, MD    Allergies: Patient has no known allergies.    Review of Systems  Cardiovascular:  Positive for chest pain.    Updated Vital Signs BP 101/69 (BP Location: Right Arm)   Pulse 91   Temp 97.9 F (36.6 C) (Oral)   Resp 18   Ht 5' 11 (1.803 m)   Wt 99.8 kg   SpO2 100%   BMI 30.69 kg/m   Physical Exam Vitals and nursing note reviewed.  HENT:     Head: Normocephalic.  Pulmonary:     Breath sounds: No decreased breath sounds or wheezing.  Chest:     Chest wall: No tenderness.  Abdominal:     Tenderness: There is no abdominal tenderness.  Musculoskeletal:     Right lower leg: No edema.     Left lower leg: No edema.  Skin:    Capillary Refill: Capillary refill takes less than 2 seconds.  Neurological:     Mental Status: He is alert.     (all labs ordered are listed, but only abnormal results are displayed) Labs Reviewed  COMPREHENSIVE METABOLIC PANEL WITH GFR  LIPASE, BLOOD  CBC  TROPONIN I (HIGH SENSITIVITY)    EKG: EKG Interpretation  Date/Time:  Saturday December 31 2023 13:21:19 EDT Ventricular Rate:  85 PR Interval:    QRS Duration:  114 QT Interval:  380 QTC Calculation: 452 R Axis:   31  Text Interpretation: Atrial flutter Borderline intraventricular conduction delay Nonspecific T abnormalities, lateral leads Confirmed by Patsey Lot [phone]) on 12/31/2023 1:22:10 PM  Radiology: [facility] Chest Portable 1 View Result Date: 12/31/2023 CLINICAL DATA:  chest pain EXAM: PORTABLE CHEST - 1 VIEW COMPARISON:  12/23/2023 FINDINGS: Lungs are clear.  No pneumothorax. Heart size and mediastinal contours are within normal limits. CABG markers. No effusion. Sternotomy wires. IMPRESSION: No acute cardiopulmonary disease. Electronically Signed   By: JONETTA Faes M.D.   On:  12/31/2023 13:41    {Document cardiac monitor, telemetry assessment procedure when appropriate:32947} Procedures   Medications Ordered in the ED - No data to display    {Click here for ABCD2, HEART and other calculators REFRESH Note before signing:1}                              Medical Decision Making Amount and/or Complexity of Data Reviewed Labs: ordered. Radiology: ordered.    patient with chest pain.  Began after eating.  History of atrial flutter.  EKG reassuring will get x-ray and blood work.  Reviewed recent admission to hospital.  Social determinant of health is his homelessness.  {Document critical care time when appropriate  Document review of labs and clinical decision tools ie CHADS2VASC2, etc  Document your independent review of radiology images and any outside records  Document your discussion with family members, caretakers and with consultants  Document social determinants of health affecting pt's care  Document your decision making why or why not admission, treatments were needed:32947:::1}   Final diagnoses:  None    ED Discharge Orders     None

## 2023-12-31 NOTE — ED Triage Notes (Signed)
 BIB [facility] from [facility]. Pt complaining of chest pain with squeezing sensation x1 day. Chest pain increased after eating lunch (fried chicken and hotdog) per pt. Hx of open heart surgery. Aox4 upon arrival.

## 2024-01-04 ENCOUNTER — Encounter: Payer: Self-pay | Admitting: Physician Assistant

## 2024-01-04 ENCOUNTER — Encounter: Payer: Self-pay | Admitting: *Deleted

## 2024-01-04 LAB — GLUCOSE, POCT (MANUAL RESULT ENTRY): POC Glucose: 108 mg/dL — AB (ref 70–99)

## 2024-01-04 NOTE — Progress Notes (Signed)
 Pt c/o RLE edema and pain L antecubital area  07/19 ER visit for CP that started after lunch. No meds given, no S&S of ischemia.   EF was 35-40% by echo 06/2023, Dr Florencio at [facility]. Has been on anticoag for Afib in the past.  Pt has not taken meds yet today. All meds were filled 07/15 at d/c from the hospital by the [facility] pharmacy. Says a med box will help him. [place] a med box for him.   Unclear if he has been taking meds at all. Says not taking metformin  at all.   Phone does not work.  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 He has some edema R ankle but not pitting. R heel is tender but no sores seen.  Says the edema comes and goes. No edema on the L.  Pt teeth in very poor shape, no recent dental care.   Pt has a crutch and uses that to walk.   Has MCR and MCaid, thinks he has dental coverage.  Previously lived in the [place] area.  He was given shoes, which will help his walking.  His weight was up today, but JVD w/ minimal elevation.     01/04/2024    3:16 PM 01/04/2024   10:30 AM 12/31/2023    3:00 PM  Vitals with BMI  Weight 236 lbs 13 oz    BMI 33.04    Systolic  138 111  Diastolic  68 63  Pulse  84 82   Fulton Merry, PA-C 01/04/2024 3:17 PM

## 2024-01-04 NOTE — Congregational Nurse Program (Signed)
  Dept: [phone]   Congregational Nurse Program Note  Date of Encounter: 01/04/2024  Past Medical History: Past Medical History:  Diagnosis Date   CHF (congestive heart failure) (HCC)    Coronary artery disease    Diabetes mellitus without complication (HCC)    Hypertension    S/P CABG x 1     Encounter Details:  Community Questionnaire - 01/04/24 1032       Questionnaire   Ask client: Do you give verbal consent for me to treat you today? Yes    Student Assistance N/A    Location Patient Served  [facility]    Encounter Setting CN site    Population Status Unhoused    Insurance Medicaid;Medicare    Insurance/Financial Assistance Referral N/A    Medication N/A    Medical Provider Yes    Screening Referrals Made N/A    Medical Referrals Made [facility] PCP[facility]    Medical Appointment Completed N/A    CNP Interventions Advocate/Support    Screenings CN Performed Blood Pressure;Blood Glucose    ED Visit Averted N/A    Life-Saving Intervention Made N/A        Client seen in nurse's office requesting to see MD this afternoon in [facility] clinic. Client reports rt ankle discomfort and has RLE swelling. He uses one crutch. Client also reports pulling  sensation inside lt elbow. Checked vitals and cbg. Blood pressure 138/68, pulse 84, SpO2 97%. CBG 108. Completed triage form. Roshanna Cimino W RN CN

## 2024-01-05 ENCOUNTER — Other Ambulatory Visit: Payer: Self-pay

## 2024-01-05 ENCOUNTER — Other Ambulatory Visit: Payer: Self-pay | Admitting: Critical Care Medicine

## 2024-01-05 ENCOUNTER — Encounter: Payer: Self-pay | Admitting: *Deleted

## 2024-01-05 MED ORDER — ALBUTEROL SULFATE HFA 108 (90 BASE) MCG/ACT IN AERS
2.0000 | INHALATION_SPRAY | Freq: Four times a day (QID) | RESPIRATORY_TRACT | 2 refills | Status: DC | PRN
  Filled 2024-01-05: qty 17, 25d supply, fill #0

## 2024-01-05 NOTE — Congregational Nurse Program (Signed)
  Dept: [phone]   Congregational Nurse Program Note  Date of Encounter: 01/05/2024  Past Medical History: Past Medical History:  Diagnosis Date   CHF (congestive heart failure) (HCC)    Coronary artery disease    Diabetes mellitus without complication (HCC)    Hypertension    S/P CABG x 1     Encounter Details:  Community Questionnaire - 01/05/24 1356       Questionnaire   Ask client: Do you give verbal consent for me to treat you today? Yes    Student Assistance N/A    Location Patient Served  [facility]    Encounter Setting CN site;Phone/Text/Email    Population Status [employer];Medicare    Insurance/Financial Assistance Referral N/A    Medication Have Medication Insecurities;Patient Medications Reviewed;Provided Medication Assistance    Medical Provider Yes    Screening Referrals Made N/A    Medical Referrals Made N/A    Medical Appointment Completed [facility] PCP/[facility]inic    CNP Interventions Advocate/Support;Navigate Healthcare System;Case Management;Educate    Screenings CN Performed N/A    ED Visit Averted N/A    Life-Saving Intervention Made N/A         Received message from Dr Brien to pick up medication at [facility] pharmacy and call for PCP appt. Picked up albuterol  inhaler and brought to client at [facility]. Reviewed medication and he acknowledges understanding. Called Dr Clemencia office [phone] and made appt July 30, 1:15, [address] [place] [address]. CM made aware to assist with transportation. Written information given to client. Fumiko Cham W RN CN

## 2024-01-09 ENCOUNTER — Encounter: Payer: Self-pay | Admitting: *Deleted

## 2024-01-09 LAB — GLUCOSE, POCT (MANUAL RESULT ENTRY): POC Glucose: 87 mg/dL (ref 70–99)

## 2024-01-09 NOTE — Congregational Nurse Program (Signed)
  Dept: [phone]   Congregational Nurse Program Note  Date of Encounter: 01/09/2024  Past Medical History: Past Medical History:  Diagnosis Date   CHF (congestive heart failure) (HCC)    Coronary artery disease    Diabetes mellitus without complication (HCC)    Hypertension    S/P CABG x 1     Encounter Details:  Community Questionnaire - 01/09/24 1021       Questionnaire   Ask client: Do you give verbal consent for me to treat you today? Yes    Student Assistance N/A    Location Patient Served  [facility]    Encounter Setting CN site    Population Status Unhoused    Insurance Medicaid;Medicare    Insurance/Financial Assistance Referral N/A    Medication N/A    Medical Provider Yes    Screening Referrals Made N/A    Medical Referrals Made N/A    Medical Appointment Completed N/A    CNP Interventions Advocate/Support;Case Management;Navigate Healthcare System    Screenings CN Performed Blood Pressure;Blood Glucose    ED Visit Averted N/A    Life-Saving Intervention Made N/A         Client came to nurse's office for vital sign check and cbg. Blood pressure (!) 154/72, pulse 91, SpO2 93%. CBG 87. Client has a PCP appt Wednesday July 30th. CM Cat made aware and is assisting with transportation. Elasha Tess W RN CN

## 2024-01-11 ENCOUNTER — Other Ambulatory Visit: Payer: Self-pay

## 2024-01-11 MED ORDER — TRAMADOL HCL 50 MG PO TABS
50.0000 mg | ORAL_TABLET | Freq: Two times a day (BID) | ORAL | 0 refills | Status: DC | PRN
  Filled 2024-01-11: qty 14, 7d supply, fill #0
  Filled 2024-02-02: qty 14, 7d supply, fill #1

## 2024-01-11 MED ORDER — SERTRALINE HCL 50 MG PO TABS
50.0000 mg | ORAL_TABLET | Freq: Every day | ORAL | 1 refills | Status: DC
  Filled 2024-01-11: qty 90, 90d supply, fill #0

## 2024-01-16 ENCOUNTER — Encounter: Payer: Self-pay | Admitting: *Deleted

## 2024-01-16 NOTE — Congregational Nurse Program (Signed)
  Dept: [phone]   Congregational Nurse Program Note  Date of Encounter: 01/16/2024  Past Medical History: Past Medical History:  Diagnosis Date   CHF (congestive heart failure) (HCC)    Coronary artery disease    Diabetes mellitus without complication (HCC)    Hypertension    S/P CABG x 1     Encounter Details:  Community Questionnaire - 01/16/24 1409       Questionnaire   Ask client: Do you give verbal consent for me to treat you today? Yes    Student Assistance N/A    Location Patient Served  [facility]    Encounter Setting CN site    Population Status Unhoused    Insurance Medicaid;Medicare    Insurance/Financial Assistance Referral N/A    Medication Have Medication Insecurities;Patient Medications Reviewed;Provided Medication Assistance    Medical Provider Yes    Screening Referrals Made Heartwise    Medical Referrals Made N/A    Medical Appointment Completed N/A    CNP Interventions Advocate/Support;Case Management;Educate    Screenings CN Performed Blood Pressure    ED Visit Averted N/A    Life-Saving Intervention Made N/A        Client came to nurse's office requesting assistance with pill box. Weekly pill box filled and education provided. Checked vitals. Blood pressure (!) 145/77, pulse 92, SpO2 95%. Offered to check cbg and client had recently eaten. Client reports he feels like he has a bug in his right ear. Looked in ear and could see wax only. Referred client to come to [facility] on Wednesday afternoon to be evaluated by [facility] or [facility]. Offered support and encouragement. Dashanti Burr W RN CN

## 2024-01-31 LAB — GLUCOSE, POCT (MANUAL RESULT ENTRY): POC Glucose: 104 mg/dL — AB (ref 70–99)

## 2024-01-31 NOTE — Congregational Nurse Program (Signed)
  Dept: [phone]   Congregational Nurse Program Note  Date of Encounter: 01/31/2024  Resident came to clinic for assistance with making appointment with [facility]. Dentist has recommended a full mouth extraction. Appointment made for September 3rd.  BP 142/87, pulse 89, O2 Sat 97%, discussed normal blood pressure range and importance of keeping blood pressure lower. Past Medical History: Past Medical History:  Diagnosis Date   CHF (congestive heart failure) (HCC)    Coronary artery disease    Diabetes mellitus without complication (HCC)    Hypertension    S/P CABG x 1     Encounter Details:  Community Questionnaire - 01/31/24 1010       Questionnaire   Ask client: Do you give verbal consent for me to treat you today? Yes    Student Assistance N/A    Location Patient Served  [facility]    Encounter Setting CN site    Population Status Unhoused    Insurance Medicaid;Medicare    Insurance/Financial Assistance Referral N/A    Medication Have Medication Insecurities;Patient Medications Reviewed    Medical Provider Yes    Screening Referrals Made N/A    Medical Referrals Made Dental    Medical Appointment Completed N/A    CNP Interventions Advocate/Support;Case Management;Educate;Counsel    Screenings CN Performed Blood Pressure;Blood Glucose    ED Visit Averted N/A    Life-Saving Intervention Made N/A

## 2024-02-02 ENCOUNTER — Other Ambulatory Visit: Payer: Self-pay

## 2024-02-02 NOTE — Congregational Nurse Program (Signed)
  Dept: [phone]   Congregational Nurse Program Note  Date of Encounter: 02/02/2024  Clinic visit to check blood pressure and assist with filling pillbox.  Reviewed all of medications, discussed difference in chewable aspirin  and other medicine that looks similar. Called pharmacy to reorder Tramadol  that he is taking each am for pain right ankle, ankle slightly swollen.  BP 106/64, pulse 86 and slightly irregular, denies chest pain, headache or shortness of breath, voiced understanding of when to use nitroglycerin . Past Medical History: Past Medical History:  Diagnosis Date   CHF (congestive heart failure) (HCC)    Coronary artery disease    Diabetes mellitus without complication (HCC)    Hypertension    S/P CABG x 1     Encounter Details:  Community Questionnaire - 02/02/24 1430       Questionnaire   Ask client: Do you give verbal consent for me to treat you today? Yes    Student Assistance N/A    Location Patient Served  [facility]    Encounter Setting CN site    Population Status Unhoused    Insurance Medicaid;Medicare    Insurance/Financial Assistance Referral N/A    Medication Have Medication Insecurities;Patient Medications Reviewed;Provided Medication Assistance    Medical Provider Yes    Screening Referrals Made N/A    Medical Referrals Made N/A    Medical Appointment Completed N/A    CNP Interventions Advocate/Support;Educate;Counsel;Navigate Healthcare System    Screenings CN Performed Blood Pressure    ED Visit Averted N/A    Life-Saving Intervention Made N/A

## 2024-02-06 ENCOUNTER — Other Ambulatory Visit: Payer: Self-pay

## 2024-02-20 ENCOUNTER — Other Ambulatory Visit: Payer: Self-pay

## 2024-02-20 ENCOUNTER — Emergency Department (HOSPITAL_COMMUNITY)
Admission: EM | Admit: 2024-02-20 | Discharge: 2024-02-20 | Disposition: A | Discharge status: Home / Self Care | Attending: Emergency Medicine | Admitting: Emergency Medicine

## 2024-02-20 ENCOUNTER — Emergency Department (HOSPITAL_COMMUNITY)

## 2024-02-20 DIAGNOSIS — E875 Hyperkalemia: Secondary | ICD-10-CM | POA: Diagnosis not present

## 2024-02-20 DIAGNOSIS — Z79899 Other long term (current) drug therapy: Secondary | ICD-10-CM | POA: Insufficient documentation

## 2024-02-20 DIAGNOSIS — Z7984 Long term (current) use of oral hypoglycemic drugs: Secondary | ICD-10-CM | POA: Diagnosis not present

## 2024-02-20 DIAGNOSIS — Z7982 Long term (current) use of aspirin: Secondary | ICD-10-CM | POA: Insufficient documentation

## 2024-02-20 DIAGNOSIS — I10 Essential (primary) hypertension: Secondary | ICD-10-CM | POA: Insufficient documentation

## 2024-02-20 DIAGNOSIS — E119 Type 2 diabetes mellitus without complications: Secondary | ICD-10-CM | POA: Insufficient documentation

## 2024-02-20 DIAGNOSIS — I2510 Atherosclerotic heart disease of native coronary artery without angina pectoris: Secondary | ICD-10-CM | POA: Insufficient documentation

## 2024-02-20 DIAGNOSIS — R519 Headache, unspecified: Secondary | ICD-10-CM | POA: Insufficient documentation

## 2024-02-20 DIAGNOSIS — Z7901 Long term (current) use of anticoagulants: Secondary | ICD-10-CM | POA: Diagnosis not present

## 2024-02-20 DIAGNOSIS — R0602 Shortness of breath: Secondary | ICD-10-CM | POA: Insufficient documentation

## 2024-02-20 LAB — TROPONIN T, HIGH SENSITIVITY
Troponin T High Sensitivity: 16 ng/L (ref 0–19)
Troponin T High Sensitivity: 15 ng/L (ref 0–19)

## 2024-02-20 LAB — COMPREHENSIVE METABOLIC PANEL WITH GFR
ALT: 9 U/L (ref 0–44)
AST: 19 U/L (ref 15–41)
Albumin: 4 g/dL (ref 3.5–5.0)
Alkaline Phosphatase: 70 U/L (ref 38–126)
Anion gap: 12 (ref 5–15)
BUN: 28 mg/dL — ABNORMAL HIGH (ref 8–23)
CO2: 19 mmol/L — ABNORMAL LOW (ref 22–32)
Calcium: 9.3 mg/dL (ref 8.9–10.3)
Chloride: 109 mmol/L (ref 98–111)
Creatinine, Ser: 1.41 mg/dL — ABNORMAL HIGH (ref 0.61–1.24)
GFR, Estimated: 55 mL/min — ABNORMAL LOW (ref >60)
Glucose, Bld: 75 mg/dL (ref 70–99)
Potassium: 5.5 mmol/L — ABNORMAL HIGH (ref 3.5–5.1)
Sodium: 139 mmol/L (ref 135–145)
Total Bilirubin: 0.5 mg/dL (ref 0.0–1.2)
Total Protein: 7.1 g/dL (ref 6.5–8.1)

## 2024-02-20 LAB — PRO BRAIN NATRIURETIC PEPTIDE: Pro Brain Natriuretic Peptide: 381 pg/mL — ABNORMAL HIGH (ref <300.0)

## 2024-02-20 LAB — CBC WITH DIFFERENTIAL/PLATELET
Abs Immature Granulocytes: 0.02 K/uL (ref 0.00–0.07)
Basophils Absolute: 0.1 K/uL (ref 0.0–0.1)
Basophils Relative: 1 %
Eosinophils Absolute: 0.2 K/uL (ref 0.0–0.5)
Eosinophils Relative: 3 %
HCT: 41.5 % (ref 39.0–52.0)
Hemoglobin: 12.9 g/dL — ABNORMAL LOW (ref 13.0–17.0)
Immature Granulocytes: 0 %
Lymphocytes Relative: 32 %
Lymphs Abs: 2 K/uL (ref 0.7–4.0)
MCH: 30.5 pg (ref 26.0–34.0)
MCHC: 31.1 g/dL (ref 30.0–36.0)
MCV: 98.1 fL (ref 80.0–100.0)
Monocytes Absolute: 0.7 K/uL (ref 0.1–1.0)
Monocytes Relative: 11 %
Neutro Abs: 3.3 K/uL (ref 1.7–7.7)
Neutrophils Relative %: 53 %
Platelets: 203 K/uL (ref 150–400)
RBC: 4.23 MIL/uL (ref 4.22–5.81)
RDW: 16.9 % — ABNORMAL HIGH (ref 11.5–15.5)
WBC: 6.3 K/uL (ref 4.0–10.5)
nRBC: 0 % (ref 0.0–0.2)

## 2024-02-20 LAB — CBG MONITORING, ED: Glucose-Capillary: 78 mg/dL (ref 70–99)

## 2024-02-20 LAB — D-DIMER, QUANTITATIVE: D-Dimer, Quant: 1.1 ug{FEU}/mL — ABNORMAL HIGH (ref 0.00–0.50)

## 2024-02-20 MED ORDER — IOHEXOL 350 MG/ML SOLN
75.0000 mL | Freq: Once | INTRAVENOUS | Status: AC | PRN
  Administered 2024-02-20: 75 mL via INTRAVENOUS

## 2024-02-20 NOTE — ED Notes (Signed)
 Pt now OK to eat per Dr. Zammit.   Pt provided with sandwich, and juice. Pt requesting additional sandwich and juice.

## 2024-02-20 NOTE — ED Triage Notes (Signed)
 [facility] and headache. Endorses some weakness. Ongoing ankle problems. HA started today, did fall a few days ago and hit head. Has not had meds in a few days.   180/82 HR 120 99% RA RR 26  Cbg 82

## 2024-02-20 NOTE — ED Notes (Signed)
 PT ambulatory out of ED without AVS. Pt was found to be walking out of the ED doors, and was given AVS.

## 2024-02-20 NOTE — ED Provider Notes (Signed)
 [facility] AT [facility] Provider Note   CSN: [id number] Arrival date & time: 02/20/24  1048     Patient presents with: Shortness of Breath and Headache   Terren Jandreau is a 68 y.o. male.  {Add pertinent medical, surgical, social history, OB history to YEP:67052} Patient has a history of coronary artery disease along with diabetes hypertension and atrial flutter.  Patient states he fell and hit his head a week ago and complains of a headache.  He also has some shortness of breath   Shortness of Breath Associated symptoms: headaches   Headache      Prior to Admission medications   Medication Sig Start Date End Date Taking? Authorizing Provider  albuterol  (VENTOLIN  HFA) 108 (90 Base) MCG/ACT inhaler Inhale 2 puffs into the lungs every 6 (six) hours as needed for wheezing or shortness of breath. 01/05/24   Brien Belvie BRAVO, MD  acetaminophen  (TYLENOL ) 500 MG tablet Take 1 tablet (500 mg total) by mouth every 6 (six) hours as needed. Patient not taking: Reported on 10/27/2023 10/23/23   Mannie Pac T, DO  amiodarone  (PACERONE ) 200 MG tablet Take 1 tablet (200 mg total) by mouth daily. 12/27/23 03/27/24  Raenelle Coria, MD  aspirin  81 MG chewable tablet Chew 1 tablet (81 mg total) by mouth daily. 12/27/23 04/25/24  Raenelle Coria, MD  atorvastatin  (LIPITOR) 40 MG tablet Take 1 tablet (40 mg total) by mouth daily. 12/27/23   Raenelle Coria, MD  carvedilol  (COREG ) 3.125 MG tablet Take 1 tablet (3.125 mg total) by mouth 2 (two) times daily. 12/27/23 03/27/24  Raenelle Coria, MD  metFORMIN  (GLUCOPHAGE ) 1000 MG tablet Take 1 tablet (1,000 mg total) by mouth daily with breakfast. 12/27/23 03/27/24  Raenelle Coria, MD  nitroGLYCERIN  (NITROSTAT ) 0.4 MG SL tablet Place 1 tablet (0.4 mg total) under the tongue every 5 (five) minutes as needed for chest pain up to 3 doses. 12/27/23   Raenelle Coria, MD  pantoprazole  (PROTONIX ) 20 MG tablet Take 1 tablet (20 mg total)  by mouth daily. 12/27/23   Raenelle Coria, MD  rivaroxaban  (XARELTO ) 20 MG TABS tablet Take 1 tablet (20 mg total) by mouth daily at 5 pm 12/27/23 03/27/24  Raenelle Coria, MD  sacubitril -valsartan  (ENTRESTO ) 24-26 MG Take 1 tablet by mouth every 12 (twelve) hours. 12/27/23   Raenelle Coria, MD  sertraline  (ZOLOFT ) 50 MG tablet Take 1 tablet (50 mg total) by mouth daily. 01/11/24   Sadie Manna, MD  traMADol  (ULTRAM ) 50 MG tablet Take 1 tablet (50 mg total) by mouth 2 (two) times daily as needed for Pain for up to 30 days 01/11/24       Allergies: Patient has no known allergies.    Review of Systems  Respiratory:  Positive for shortness of breath.   Neurological:  Positive for headaches.    Updated Vital Signs BP (!) 167/79 (BP Location: Left Arm)   Pulse 96   Temp 98.4 F (36.9 C) (Oral)   Resp (!) 21   SpO2 99%   Physical Exam  (all labs ordered are listed, but only abnormal results are displayed) Labs Reviewed  CBC WITH DIFFERENTIAL/PLATELET - Abnormal; Notable for the following components:      Result Value   Hemoglobin 12.9 (*)    RDW 16.9 (*)    All other components within normal limits  COMPREHENSIVE METABOLIC PANEL WITH GFR - Abnormal; Notable for the following components:   Potassium 5.5 (*)    CO2 19 (*)  BUN 28 (*)    Creatinine, Ser 1.41 (*)    GFR, Estimated 55 (*)    All other components within normal limits  PRO BRAIN NATRIURETIC PEPTIDE - Abnormal; Notable for the following components:   Pro Brain Natriuretic Peptide 381.0 (*)    All other components within normal limits  D-DIMER, QUANTITATIVE - Abnormal; Notable for the following components:   D-Dimer, Quant 1.10 (*)    All other components within normal limits  CBG MONITORING, ED  CBG MONITORING, ED  TROPONIN T, HIGH SENSITIVITY  TROPONIN T, HIGH SENSITIVITY    EKG: EKG Interpretation Date/Time:  Monday February 20 2024 11:13:37 EDT Ventricular Rate:  100 PR Interval:    QRS  Duration:  104 QT Interval:  348 QTC Calculation: 449 R Axis:   27  Text Interpretation: Atrial flutter Nonspecific T abnormalities, lateral leads Confirmed by Suzette Pac [phone]) on 02/20/2024 2:58:50 PM  Radiology: CT Angio Chest PE W and/or Wo Contrast Result Date: 02/20/2024 CLINICAL DATA:  Short of breath, headache, dizziness, fell several days ago EXAM: CT ANGIOGRAPHY CHEST WITH CONTRAST TECHNIQUE: Multidetector CT imaging of the chest was performed using the standard protocol during bolus administration of intravenous contrast. Multiplanar CT image reconstructions and MIPs were obtained to evaluate the vascular anatomy. RADIATION DOSE REDUCTION: This exam was performed according to the departmental dose-optimization program which includes automated exposure control, adjustment of the mA and/or kV according to patient size and/or use of iterative reconstruction technique. CONTRAST:  75mL OMNIPAQUE  IOHEXOL  350 MG/ML SOLN COMPARISON:  06/26/2023 FINDINGS: Cardiovascular: This is a technically adequate evaluation of the pulmonary vasculature. No filling defects or pulmonary emboli. The heart is enlarged without pericardial effusion. No evidence of thoracic aortic aneurysm or dissection. Atherosclerosis of the aorta and coronary vasculature, with postsurgical changes from prior CABG. Mediastinum/Nodes: No enlarged mediastinal, hilar, or axillary lymph nodes. Thyroid  gland, trachea, and esophagus demonstrate no significant findings. Lungs/Pleura: No acute airspace disease, effusion, or pneumothorax. Benign 6 mm subpleural right middle lobe pulmonary nodules unchanged, no further imaging follow-up is required. The central airways are patent. Upper Abdomen: No acute abnormality. Musculoskeletal: No acute or destructive bony abnormalities. Reconstructed images demonstrate no additional findings. Review of the MIP images confirms the above findings. IMPRESSION: 1. No evidence of pulmonary embolus. 2. No acute  intrathoracic process. 3. Stable cardiomegaly. 4. Aortic Atherosclerosis (ICD10-I70.0). Coronary artery atherosclerosis. Electronically Signed   By: Ozell Daring M.D.   On: 02/20/2024 15:36   CT Head Wo Contrast Result Date: 02/20/2024 CLINICAL DATA:  Clemens several days ago and hit head, headache, weakness EXAM: CT HEAD WITHOUT CONTRAST TECHNIQUE: Contiguous axial images were obtained from the base of the skull through the vertex without intravenous contrast. RADIATION DOSE REDUCTION: This exam was performed according to the departmental dose-optimization program which includes automated exposure control, adjustment of the mA and/or kV according to patient size and/or use of iterative reconstruction technique. COMPARISON:  07/31/2023 FINDINGS: Brain: Stable hypodensities throughout the periventricular white matter and bilateral basal ganglia, compatible with chronic small vessel ischemic changes. No evidence of acute infarct or hemorrhage. Lateral ventricles and midline structures are unremarkable. No acute extra-axial fluid collections. No mass effect. Vascular: No hyperdense vessel or unexpected calcification. Skull: Normal. Negative for fracture or focal lesion. Sinuses/Orbits: No acute finding. Other: None. IMPRESSION: 1. Stable head CT, no acute intracranial process. Electronically Signed   By: Ozell Daring M.D.   On: 02/20/2024 15:32   DG Chest Port 1 View Result Date: 02/20/2024 CLINICAL  DATA:  Shortness of breath and headaches. EXAM: PORTABLE CHEST 1 VIEW COMPARISON:  Radiograph 12/30/2023 and 12/23/2023.  CT 06/26/2023. FINDINGS: 1133 hours. The heart size and mediastinal contours are stable post median sternotomy and CABG. The lungs appear clear. There is no pleural effusion or pneumothorax. No acute osseous findings are evident. There are degenerative changes at both shoulders. IMPRESSION: No evidence of acute cardiopulmonary process. Previous CABG. Electronically Signed   By: Elsie Perone M.D.    On: 02/20/2024 12:14    {Document cardiac monitor, telemetry assessment procedure when appropriate:32947} Procedures   Medications Ordered in the ED  iohexol  (OMNIPAQUE ) 350 MG/ML injection 75 mL (75 mLs Intravenous Contrast Given 02/20/24 1510)   CT of the head and chest are unremarkable.  Patient's vital signs are normal.   {Click here for ABCD2, HEART and other calculators REFRESH Note before signing:1}                              Medical Decision Making Amount and/or Complexity of Data Reviewed Labs: ordered. Radiology: ordered. ECG/medicine tests: ordered.  Risk Prescription drug management.   Headache from previous head injury.  Patient will follow-up with his family doctor  {Document critical care time when appropriate  Document review of labs and clinical decision tools ie CHADS2VASC2, etc  Document your independent review of radiology images and any outside records  Document your discussion with family members, caretakers and with consultants  Document social determinants of health affecting pt's care  Document your decision making why or why not admission, treatments were needed:32947:::1}   Final diagnoses:  Bad headache    ED Discharge Orders     None

## 2024-02-20 NOTE — ED Notes (Signed)
 PT requesting to eat. Pt informed of NPO status pending CT. PT unhappy.

## 2024-02-20 NOTE — ED Notes (Signed)
 PT assisted with calling for a ride home.

## 2024-02-20 NOTE — Discharge Instructions (Signed)
 Continue taking your present medications.  Follow-up with your doctor

## 2024-02-22 ENCOUNTER — Encounter: Payer: Self-pay | Admitting: Physician Assistant

## 2024-02-22 NOTE — Progress Notes (Cosign Needed)
 PCP: Sadie Manna, MD  Pt is concerned about a rash on his legs. It itches. He has picker's nodules from scratching. The appearance of both legs is dry and ashy. He was given ointment to use and [place] soap  His breath is short, his respiratory rate is elevated.   He requests his med box be filled.   He has not had meds in 4 days. His med box was refilled and he took am meds while with us .   The Protonix  was out, the ASA 81 mg was low, pt given OTC bottle of this.   His BP is well-controlled.  Today's Vitals   02/22/24 1527  BP: 117/80  Pulse: 85  SpO2: 96%   There is no height or weight on file to calculate BMI.  Shona Shad, PA-C 02/22/2024 3:32 PM

## 2024-02-23 ENCOUNTER — Other Ambulatory Visit: Payer: Self-pay

## 2024-02-23 ENCOUNTER — Other Ambulatory Visit (HOSPITAL_COMMUNITY): Payer: Self-pay

## 2024-02-23 DIAGNOSIS — I5022 Chronic systolic (congestive) heart failure: Secondary | ICD-10-CM | POA: Diagnosis present

## 2024-02-23 MED ORDER — TORSEMIDE 20 MG PO TABS
20.0000 mg | ORAL_TABLET | Freq: Every day | ORAL | 3 refills | Status: DC
  Filled 2024-02-23 (×2): qty 90, 90d supply, fill #0

## 2024-02-23 MED ORDER — CARVEDILOL 6.25 MG PO TABS
6.2500 mg | ORAL_TABLET | Freq: Two times a day (BID) | ORAL | 3 refills | Status: DC
  Filled 2024-02-23 (×2): qty 60, 30d supply, fill #0

## 2024-02-27 ENCOUNTER — Encounter: Payer: Self-pay | Admitting: *Deleted

## 2024-02-27 ENCOUNTER — Emergency Department (HOSPITAL_COMMUNITY)

## 2024-02-27 ENCOUNTER — Emergency Department (HOSPITAL_COMMUNITY): Admission: EM | Admit: 2024-02-27 | Discharge: 2024-02-27 | Disposition: A | Discharge status: Home / Self Care

## 2024-02-27 DIAGNOSIS — G8929 Other chronic pain: Secondary | ICD-10-CM | POA: Diagnosis not present

## 2024-02-27 DIAGNOSIS — Z7982 Long term (current) use of aspirin: Secondary | ICD-10-CM | POA: Diagnosis not present

## 2024-02-27 DIAGNOSIS — M25571 Pain in right ankle and joints of right foot: Secondary | ICD-10-CM | POA: Insufficient documentation

## 2024-02-27 DIAGNOSIS — Z7901 Long term (current) use of anticoagulants: Secondary | ICD-10-CM | POA: Diagnosis not present

## 2024-02-27 LAB — URINALYSIS, ROUTINE W REFLEX MICROSCOPIC
Bilirubin Urine: NEGATIVE
Glucose, UA: NEGATIVE mg/dL
Hgb urine dipstick: NEGATIVE
Ketones, ur: NEGATIVE mg/dL
Leukocytes,Ua: NEGATIVE
Nitrite: NEGATIVE
Protein, ur: NEGATIVE mg/dL
Specific Gravity, Urine: 1.005 (ref 1.005–1.030)
pH: 5 (ref 5.0–8.0)

## 2024-02-27 MED ORDER — TRAMADOL HCL 50 MG PO TABS: 50.0000 mg | ORAL_TABLET | Freq: Four times a day (QID) | ORAL | 0 refills | Status: DC | PRN

## 2024-02-27 MED ORDER — ACETAMINOPHEN 500 MG PO TABS
1000.0000 mg | ORAL_TABLET | Freq: Once | ORAL | Status: AC
  Administered 2024-02-27: 1000 mg via ORAL
  Filled 2024-02-27: qty 2

## 2024-02-27 NOTE — ED Provider Triage Note (Signed)
 Emergency Medicine Provider Triage Evaluation Note  Jeremy [name] , a 68 y.o. male  was evaluated in triage.  Pt complains of ankle pain and increased urination  Review of Systems  Positive: Ankle pain, urinary frequency  Negative: dysuria  Physical Exam  BP 119/67 (BP Location: Right Arm)   Pulse 69   Temp 97.8 F (36.6 C)   Resp (!) 22   SpO2 99%  Gen:   Awake, no distress   Resp:  Normal effort  MSK:   Moves extremities without difficulty, right ankle swollen, tender just distal to bilateral malleoli   Medical Decision Making  Medically screening exam initiated at 11:02 AM.  Appropriate orders placed.  Jeremy [name] was informed that the remainder of the evaluation will be completed by another provider, this initial triage assessment does not replace that evaluation, and the importance of remaining in the ED until their evaluation is complete.     Jeremy Bouchard L, DO 02/27/24 1102

## 2024-02-27 NOTE — ED Triage Notes (Signed)
 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 Can't bear weight as of today because of the pain. Pt has a pacemaker and hx of Afib.

## 2024-02-27 NOTE — ED Provider Notes (Signed)
 [name] EMERGENCY DEPARTMENT AT [facility] Provider Note   CSN: [id number] Arrival date & time: 02/27/24  1039     Patient presents with: Ankle Pain   Jeremy [name] is a 68 y.o. male.    Ankle Pain Associated symptoms: no back pain and no fever      Patient presents because a right ankle pain.  Patient states been present for a long time.  Walked a long ways today to wash his close and subsequent developed worsening right ankle pain.  Because of this came to the ED for further evaluation.  No fever no chills.  No recent falls.  No chest pain or shortness of breath.  No nausea vomit diarrhea.  Otherwise, denies all complaints other than ongoing right ankle pain.  He states he takes medicine for it but is not sure what  Previous medical history reviewed : Patient last seen in the ED on February 20, 2024.  Shortness of breath and headache.  Unremarkable exam at that time.  History of right chronic ankle pain.  Podiatry recommended conservative treatment at that time.   Prior to Admission medications   Medication Sig Start Date End Date Taking? Authorizing Provider  albuterol  (VENTOLIN  HFA) 108 (90 Base) MCG/ACT inhaler Inhale 2 puffs into the lungs every 6 (six) hours as needed for wheezing or shortness of breath. 01/05/24   Brien Belvie BRAVO, MD  traMADol  (ULTRAM ) 50 MG tablet Take 1 tablet (50 mg total) by mouth every 6 (six) hours as needed. 02/27/24  Yes Simon Lavonia SAILOR, MD  acetaminophen  (TYLENOL ) 500 MG tablet Take 1 tablet (500 mg total) by mouth every 6 (six) hours as needed. Patient not taking: Reported on 10/27/2023 10/23/23   Mannie Pac T, DO  amiodarone  (PACERONE ) 200 MG tablet Take 1 tablet (200 mg total) by mouth daily. 12/27/23 03/27/24  Raenelle Coria, MD  aspirin  81 MG chewable tablet Chew 1 tablet (81 mg total) by mouth daily. 12/27/23 04/25/24  Raenelle Coria, MD  atorvastatin  (LIPITOR) 40 MG tablet Take 1 tablet (40 mg total) by mouth daily. 12/27/23    Raenelle Coria, MD  carvedilol  (COREG ) 3.125 MG tablet Take 1 tablet (3.125 mg total) by mouth 2 (two) times daily. 12/27/23 03/27/24  Raenelle Coria, MD  carvedilol  (COREG ) 6.25 MG tablet Take 1 tablet (6.25 mg total) by mouth 2 (two) times daily. 02/23/24     metFORMIN  (GLUCOPHAGE ) 1000 MG tablet Take 1 tablet (1,000 mg total) by mouth daily with breakfast. 12/27/23 03/27/24  Raenelle Coria, MD  nitroGLYCERIN  (NITROSTAT ) 0.4 MG SL tablet Place 1 tablet (0.4 mg total) under the tongue every 5 (five) minutes as needed for chest pain up to 3 doses. 12/27/23   Raenelle Coria, MD  pantoprazole  (PROTONIX ) 20 MG tablet Take 1 tablet (20 mg total) by mouth daily. 12/27/23   Raenelle Coria, MD  rivaroxaban  (XARELTO ) 20 MG TABS tablet Take 1 tablet (20 mg total) by mouth daily at 5 pm 12/27/23 03/27/24  Raenelle Coria, MD  sacubitril -valsartan  (ENTRESTO ) 24-26 MG Take 1 tablet by mouth every 12 (twelve) hours. 12/27/23   Raenelle Coria, MD  sertraline  (ZOLOFT ) 50 MG tablet Take 1 tablet (50 mg total) by mouth daily. 01/11/24   Sadie Manna, MD  torsemide  (DEMADEX ) 20 MG tablet Take 1 tablet (20 mg total) by mouth daily. 02/23/24     traMADol  (ULTRAM ) 50 MG tablet Take 1 tablet (50 mg total) by mouth 2 (two) times daily as needed for Pain for up to  30 days 01/11/24       Allergies: Patient has no known allergies.    Review of Systems  Constitutional:  Negative for chills and fever.  HENT:  Negative for ear pain and sore throat.   Eyes:  Negative for pain and visual disturbance.  Respiratory:  Negative for cough and shortness of breath.   Cardiovascular:  Negative for chest pain and palpitations.  Gastrointestinal:  Negative for abdominal pain and vomiting.  Genitourinary:  Negative for dysuria and hematuria.  Musculoskeletal:  Negative for arthralgias and back pain.  Skin:  Negative for color change and rash.  Neurological:  Negative for seizures and syncope.  All other systems reviewed and are  negative.   Updated Vital Signs BP (!) 105/51   Pulse 61   Temp 97.8 F (36.6 C)   Resp 18   SpO2 100%   Physical Exam Vitals and nursing note reviewed.  Constitutional:      General: He is not in acute distress.    Appearance: He is well-developed.  HENT:     Head: Normocephalic and atraumatic.  Eyes:     Conjunctiva/sclera: Conjunctivae normal.  Cardiovascular:     Rate and Rhythm: Normal rate and regular rhythm.     Heart sounds: No murmur heard. Pulmonary:     Effort: Pulmonary effort is normal. No respiratory distress.     Breath sounds: Normal breath sounds.  Abdominal:     Palpations: Abdomen is soft.     Tenderness: There is no abdominal tenderness.  Musculoskeletal:        General: No swelling.     Cervical back: Neck supple.       Legs:  Skin:    General: Skin is warm and dry.     Capillary Refill: Capillary refill takes less than 2 seconds.  Neurological:     Mental Status: He is alert.  Psychiatric:        Mood and Affect: Mood normal.     (all labs ordered are listed, but only abnormal results are displayed) Labs Reviewed  URINALYSIS, ROUTINE W REFLEX MICROSCOPIC - Abnormal; Notable for the following components:      Result Value   Color, Urine STRAW (*)    All other components within normal limits    EKG: None  Radiology: DG Ankle Complete Right Result Date: 02/27/2024 EXAM: 3 VIEW(S) XRAY OF THE RIGHT ANKLE 02/27/2024 11:54:00 AM CLINICAL HISTORY: Ankle Pain. Reason for exam: ankle pain; Per triage notes: Pt here for right ankle pain, chronic ankle deformity and intermitently bothers him. Can't bear weight as of today because of the pain. Pt has a pacemaker and hx of Afib. 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 Plate and screw fixation of prior distal fibular fracture. Unchanged hardware appearance. Unchanged appearance of distal screw overlying the talus without significant  purchase within bone. Tibiotalar fusion changes with osteoarthritis. SOFT TISSUES: Soft tissue edema. IMPRESSION: 1. 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 2. Tibiotalar fusion changes with osteoarthritis. 3. Soft tissue edema, increased from prior. Electronically signed by: Donnice Mania MD 02/27/2024 12:19 PM EDT RP Workstation: [id number]     Procedures   Medications Ordered in the ED  acetaminophen  (TYLENOL ) tablet 1,000 mg (has no administration in time range)  Medical Decision Making     Patient presents because a right ankle pain.  Patient states been present for a long time.  Walked a long ways today to wash his close and subsequent developed worsening right ankle pain.  Because of this came to the ED for further evaluation.  No fever no chills.  No recent falls.  No chest pain or shortness of breath.  No nausea vomit diarrhea.  Otherwise, denies all complaints other than ongoing right ankle pain.  He states he takes medicine for it but is not sure what  Previous medical history reviewed : Patient last seen in the ED on February 20, 2024.  Shortness of breath and headache.  Unremarkable exam at that time.  History of right chronic ankle pain.  Podiatry recommended conservative treatment at that time.   Upon exam, patient has some swelling to the lateral malleolus of the right ankle.  Some mild pain to palpation.  No redness or erythema.  No warmth to the touch.  No concerns for infected joint.  Reviewed x-rays.  Looks very similar to prior x-rays.  No acute intervention needed at this time.  Patient has sensation intact distal to the injury site.  Intact dorsal pedal and posterior tibial pulses with appropriate capillary refill of the toes.   Start patient on Tylenol  here in the ED.  Called in a few more doses of tramadol  for the patient held patient.   Otherwise, patient denies all complaints.  No  chest pain or shortness of breath.  No nausea or diarrhea.  No recent falls.  Stable for discharge home     Final diagnoses:  Chronic pain of right ankle    ED Discharge Orders          Ordered    traMADol  (ULTRAM ) 50 MG tablet  Every 6 hours PRN        02/27/24 1755               Simon Lavonia SAILOR, MD 02/27/24 1757

## 2024-02-27 NOTE — Discharge Instructions (Addendum)
 For pain, you can take 1000 mg of Tylenol  or 1 g of Tylenol  every 6-8 hours.  Do not exceed more than 4000 mg or 4 g in a 24-hour period.    Keep it elevated.  Continue to use your cane.  I did call in some more tramadol  for you. Take this if pain uncontrolled. Do not take this with other sedative medication.   Please follow up with PCP.

## 2024-02-27 NOTE — Congregational Nurse Program (Signed)
  Dept: [phone]   Congregational Nurse Program Note  Date of Encounter: 02/27/2024  Past Medical History: Past Medical History:  Diagnosis Date   CHF (congestive heart failure) (HCC)    Coronary artery disease    Diabetes mellitus without complication (HCC)    Hypertension    S/P CABG x 1     Encounter Details:  Community Questionnaire - 02/27/24 0906       Questionnaire   Ask client: Do you give verbal consent for me to treat you today? N/A    Student Assistance N/A    Location Patient Served  [facility]    Encounter Setting Phone/Text/Email    Population Status Unhoused    Insurance Medicaid;Medicare    Insurance/Financial Assistance Referral N/A    Medication N/A    Medical Provider Yes    Screening Referrals Made N/A    Medical Referrals Made N/A    Medical Appointment Completed N/A    CNP Interventions Advocate/Support;Case Management;Navigate Healthcare System    Screenings CN Performed N/A    ED Visit Averted N/A    Life-Saving Intervention Made N/A         Per request from client, called [facility] [phone] to reschedule an appt as client missed prior appt. Appointment made for Sept. 29th 11a. [address] [facility]. [place] McNary. Johannes Everage W RN CN

## 2024-02-29 ENCOUNTER — Encounter: Payer: Self-pay | Admitting: Physician Assistant

## 2024-02-29 ENCOUNTER — Other Ambulatory Visit: Payer: Self-pay | Admitting: Physician Assistant

## 2024-02-29 ENCOUNTER — Other Ambulatory Visit: Payer: Self-pay | Admitting: Critical Care Medicine

## 2024-02-29 ENCOUNTER — Other Ambulatory Visit: Payer: Self-pay

## 2024-02-29 MED ORDER — TRAMADOL HCL 50 MG PO TABS
50.0000 mg | ORAL_TABLET | Freq: Four times a day (QID) | ORAL | 0 refills | Status: DC | PRN
  Filled 2024-02-29: qty 10, 3d supply, fill #0

## 2024-02-29 MED ORDER — TORSEMIDE 20 MG PO TABS
20.0000 mg | ORAL_TABLET | Freq: Every day | ORAL | 3 refills | Status: DC
  Filled 2024-02-29: qty 90, 90d supply, fill #0

## 2024-02-29 NOTE — Progress Notes (Signed)
  Pt went to the ER for R ankle pain on 09/15, he was noted to have mis-aligned bones in his ankle from a surgery in 2022. They have been noted to be poorly fused. He has a few tramadol  tabs left, a refill was sent in. However, he was not able to pick these up, they were sent to the [facility].   The torsemide  was not in his organizer, The organizer was filled on 09/10, but the Torsemide  was filled on 09/11.  His am meds were taken, but not the pm meds.   The torsemide  was added and the am meds were refilled.   A different med box was filled, easier to understand what should be taken.  His R ankle has an obvious deformity, some edema and is painful to stand on. He uses a cane but would benefit from a rollator, we will see what is available.  His PCP will be contacted and asked about an Ortho referral. A light-weight ankle brace may help, it will have to be ordered.   Shona Shad, PA-C 02/29/2024 3:28 PM

## 2024-02-29 NOTE — Progress Notes (Signed)
 Tramadol  refill  prior rx sent to wrong pharmacy

## 2024-03-07 ENCOUNTER — Encounter: Payer: Self-pay | Admitting: Physician Assistant

## 2024-03-07 NOTE — Progress Notes (Unsigned)
 Pt came in so he could get his rollator.   He kept his appt w/ Dr Florencio 09/11.   Rx reviewed, will need to be redone in October.

## 2024-03-09 ENCOUNTER — Other Ambulatory Visit: Payer: Self-pay

## 2024-03-21 ENCOUNTER — Emergency Department (HOSPITAL_COMMUNITY)

## 2024-03-21 ENCOUNTER — Emergency Department (HOSPITAL_COMMUNITY)
Admission: EM | Admit: 2024-03-21 | Discharge: 2024-03-22 | Disposition: A | Discharge status: Home / Self Care | Attending: Emergency Medicine | Admitting: Emergency Medicine

## 2024-03-21 ENCOUNTER — Encounter (HOSPITAL_COMMUNITY): Payer: Self-pay | Admitting: Emergency Medicine

## 2024-03-21 ENCOUNTER — Other Ambulatory Visit: Payer: Self-pay

## 2024-03-21 DIAGNOSIS — I130 Hypertensive heart and chronic kidney disease with heart failure and stage 1 through stage 4 chronic kidney disease, or unspecified chronic kidney disease: Secondary | ICD-10-CM | POA: Insufficient documentation

## 2024-03-21 DIAGNOSIS — N183 Chronic kidney disease, stage 3 unspecified: Secondary | ICD-10-CM | POA: Diagnosis not present

## 2024-03-21 DIAGNOSIS — F17210 Nicotine dependence, cigarettes, uncomplicated: Secondary | ICD-10-CM | POA: Insufficient documentation

## 2024-03-21 DIAGNOSIS — I480 Paroxysmal atrial fibrillation: Secondary | ICD-10-CM | POA: Insufficient documentation

## 2024-03-21 DIAGNOSIS — I509 Heart failure, unspecified: Secondary | ICD-10-CM | POA: Diagnosis not present

## 2024-03-21 DIAGNOSIS — Z79899 Other long term (current) drug therapy: Secondary | ICD-10-CM | POA: Diagnosis not present

## 2024-03-21 DIAGNOSIS — E1122 Type 2 diabetes mellitus with diabetic chronic kidney disease: Secondary | ICD-10-CM | POA: Diagnosis not present

## 2024-03-21 DIAGNOSIS — M7989 Other specified soft tissue disorders: Secondary | ICD-10-CM

## 2024-03-21 DIAGNOSIS — Z7984 Long term (current) use of oral hypoglycemic drugs: Secondary | ICD-10-CM | POA: Insufficient documentation

## 2024-03-21 DIAGNOSIS — Z7901 Long term (current) use of anticoagulants: Secondary | ICD-10-CM | POA: Insufficient documentation

## 2024-03-21 DIAGNOSIS — J40 Bronchitis, not specified as acute or chronic: Secondary | ICD-10-CM | POA: Diagnosis not present

## 2024-03-21 DIAGNOSIS — I2510 Atherosclerotic heart disease of native coronary artery without angina pectoris: Secondary | ICD-10-CM | POA: Insufficient documentation

## 2024-03-21 DIAGNOSIS — J180 Bronchopneumonia, unspecified organism: Secondary | ICD-10-CM | POA: Diagnosis not present

## 2024-03-21 DIAGNOSIS — Z7982 Long term (current) use of aspirin: Secondary | ICD-10-CM | POA: Diagnosis not present

## 2024-03-21 DIAGNOSIS — Z951 Presence of aortocoronary bypass graft: Secondary | ICD-10-CM | POA: Diagnosis not present

## 2024-03-21 DIAGNOSIS — R0602 Shortness of breath: Secondary | ICD-10-CM | POA: Diagnosis present

## 2024-03-21 DIAGNOSIS — R519 Headache, unspecified: Secondary | ICD-10-CM | POA: Insufficient documentation

## 2024-03-21 HISTORY — DX: Chronic atrial fibrillation, unspecified: I48.20

## 2024-03-21 LAB — CBC
HCT: 45.7 % (ref 39.0–52.0)
Hemoglobin: 14.6 g/dL (ref 13.0–17.0)
MCH: 30.6 pg (ref 26.0–34.0)
MCHC: 31.9 g/dL (ref 30.0–36.0)
MCV: 95.8 fL (ref 80.0–100.0)
Platelets: 232 K/uL (ref 150–400)
RBC: 4.77 MIL/uL (ref 4.22–5.81)
RDW: 14.5 % (ref 11.5–15.5)
WBC: 6.8 K/uL (ref 4.0–10.5)
nRBC: 0 % (ref 0.0–0.2)

## 2024-03-21 LAB — BASIC METABOLIC PANEL WITH GFR
Anion gap: 11 (ref 5–15)
BUN: 15 mg/dL (ref 8–23)
CO2: 22 mmol/L (ref 22–32)
Calcium: 9.3 mg/dL (ref 8.9–10.3)
Chloride: 108 mmol/L (ref 98–111)
Creatinine, Ser: 1.51 mg/dL — ABNORMAL HIGH (ref 0.61–1.24)
GFR, Estimated: 51 mL/min — ABNORMAL LOW (ref >60)
Glucose, Bld: 119 mg/dL — ABNORMAL HIGH (ref 70–99)
Potassium: 3.6 mmol/L (ref 3.5–5.1)
Sodium: 140 mmol/L (ref 135–145)

## 2024-03-21 LAB — D-DIMER, QUANTITATIVE: D-Dimer, Quant: 2.1 ug{FEU}/mL — ABNORMAL HIGH (ref 0.00–0.50)

## 2024-03-21 LAB — TROPONIN T, HIGH SENSITIVITY: Troponin T High Sensitivity: <15 ng/L (ref 0–19)

## 2024-03-21 LAB — PRO BRAIN NATRIURETIC PEPTIDE: Pro Brain Natriuretic Peptide: 331 pg/mL — ABNORMAL HIGH (ref <300.0)

## 2024-03-21 LAB — PROTIME-INR
INR: 1 (ref 0.8–1.2)
Prothrombin Time: 13.3 s (ref 11.4–15.2)

## 2024-03-21 MED ORDER — IOHEXOL 350 MG/ML SOLN
75.0000 mL | Freq: Once | INTRAVENOUS | Status: AC | PRN
  Administered 2024-03-21: 75 mL via INTRAVENOUS

## 2024-03-21 MED ORDER — DEXAMETHASONE SODIUM PHOSPHATE 10 MG/ML IJ SOLN
10.0000 mg | Freq: Once | INTRAMUSCULAR | Status: AC
  Administered 2024-03-21: 10 mg via INTRAVENOUS
  Filled 2024-03-21: qty 1

## 2024-03-21 MED ORDER — PREDNISONE 10 MG PO TABS
40.0000 mg | ORAL_TABLET | Freq: Every day | ORAL | 0 refills | Status: AC
  Filled 2024-03-21: qty 16, 4d supply, fill #0

## 2024-03-21 MED ORDER — AMOXICILLIN-POT CLAVULANATE 875-125 MG PO TABS
1.0000 | ORAL_TABLET | Freq: Once | ORAL | Status: AC
  Administered 2024-03-21: 1 via ORAL
  Filled 2024-03-21: qty 1

## 2024-03-21 MED ORDER — ALBUTEROL SULFATE HFA 108 (90 BASE) MCG/ACT IN AERS
2.0000 | INHALATION_SPRAY | Freq: Once | RESPIRATORY_TRACT | Status: AC
  Administered 2024-03-21: 2 via RESPIRATORY_TRACT
  Filled 2024-03-21: qty 6.7

## 2024-03-21 MED ORDER — DOXYCYCLINE HYCLATE 100 MG PO CAPS
100.0000 mg | ORAL_CAPSULE | Freq: Two times a day (BID) | ORAL | 0 refills | Status: DC
  Filled 2024-03-21: qty 20, 10d supply, fill #0

## 2024-03-21 MED ORDER — AMOXICILLIN-POT CLAVULANATE 875-125 MG PO TABS
1.0000 | ORAL_TABLET | Freq: Two times a day (BID) | ORAL | 0 refills | Status: DC
  Filled 2024-03-21: qty 14, 7d supply, fill #0

## 2024-03-21 MED ORDER — DOXYCYCLINE HYCLATE 100 MG PO TABS
100.0000 mg | ORAL_TABLET | Freq: Once | ORAL | Status: AC
  Administered 2024-03-21: 100 mg via ORAL
  Filled 2024-03-21: qty 1

## 2024-03-21 NOTE — ED Notes (Signed)
 Unable to obtain labs. Attempted 2x to get IV's.

## 2024-03-21 NOTE — ED Notes (Signed)
 This nurse put pt back on heart monitor and pt asked Do you like touching me? And also stated You're a really pretty girl. Explained to pt that conversation to nursing staff must be appropriate.

## 2024-03-21 NOTE — ED Notes (Signed)
 Unable to obtain blood on pt due to hard stick, RN notified

## 2024-03-21 NOTE — ED Triage Notes (Signed)
 Patient presents via EMS due to shortness of breath for 2 days and chest tightness that started this morning. He appears to be in no distress. EMS notes clear lung sounds except for rales in the right lung at times. He has not taken his medication in 2 days and is currently unhoused.    HX Afib  EMS vitals: 136/82 BP 86 HR 96% SPO2 on room air 141 CBG

## 2024-03-21 NOTE — ED Provider Notes (Signed)
 [place] EMERGENCY DEPARTMENT AT [facility] Provider Note   CSN: [id number] Arrival date & time: 03/21/24  1147     Patient presents with: Shortness of Breath and Chest Pain   Gerrett Loman is a 68 y.o. male.  {Add pertinent medical, surgical, social history, OB history to HPI:32944} HPI    68 year old male with history of coronary artery disease status post CABG x 2, heart failure with reduced ejection fraction last 45% in 2022, paroxysmal atrial fibrillation on Xarelto , hypertension, type 2 diabetes, CKD stage III, OSA, PE status post mechanical thrombectomy in 2022, recurrent PE in 2025, history of cocaine abuse, tobacco abuse, who presents with concern for chest pain.  Woke up at 10AM with shortness of breath, coughed up mucus, no blood Chest tightness No chest pain now Still feeling short of breath No nausea or vomiting  Headache started this morning, woke up with it 8/10 Has chronic right ankle pain following a prior surgery, but reports new swelling to his right lower extremity.  He has been off of his Xarelto  for the last several days because I am crazy he has been unhoused, slept in a hotel last night.    Past Medical History:  Diagnosis Date   Atrial fibrillation, chronic (HCC)    CHF (congestive heart failure) (HCC)    Coronary artery disease    Diabetes mellitus without complication (HCC)    Hypertension    S/P CABG x 1     Prior to Admission medications   Medication Sig Start Date End Date Taking? Authorizing Provider  albuterol  (VENTOLIN  HFA) 108 (90 Base) MCG/ACT inhaler Inhale 2 puffs into the lungs every 6 (six) hours as needed for wheezing or shortness of breath. 01/05/24   Brien Belvie BRAVO, MD  acetaminophen  (TYLENOL ) 500 MG tablet Take 1 tablet (500 mg total) by mouth every 6 (six) hours as needed. Patient not taking: Reported on 10/27/2023 10/23/23   Mannie Pac T, DO  amiodarone  (PACERONE ) 200 MG tablet Take 1 tablet (200 mg total)  by mouth daily. 12/27/23 03/27/24  Raenelle Coria, MD  aspirin  81 MG chewable tablet Chew 1 tablet (81 mg total) by mouth daily. 12/27/23 04/25/24  Raenelle Coria, MD  atorvastatin  (LIPITOR) 40 MG tablet Take 1 tablet (40 mg total) by mouth daily. 12/27/23   Raenelle Coria, MD  carvedilol  (COREG ) 6.25 MG tablet Take 1 tablet (6.25 mg total) by mouth 2 (two) times daily. 02/23/24     metFORMIN  (GLUCOPHAGE ) 1000 MG tablet Take 1 tablet (1,000 mg total) by mouth daily with breakfast. 12/27/23 03/27/24  Raenelle Coria, MD  nitroGLYCERIN  (NITROSTAT ) 0.4 MG SL tablet Place 1 tablet (0.4 mg total) under the tongue every 5 (five) minutes as needed for chest pain up to 3 doses. 12/27/23   Raenelle Coria, MD  rivaroxaban  (XARELTO ) 20 MG TABS tablet Take 1 tablet (20 mg total) by mouth daily at 5 pm 12/27/23 03/27/24  Raenelle Coria, MD  sacubitril -valsartan  (ENTRESTO ) 24-26 MG Take 1 tablet by mouth every 12 (twelve) hours. 12/27/23   Raenelle Coria, MD  sertraline  (ZOLOFT ) 50 MG tablet Take 1 tablet (50 mg total) by mouth daily. 01/11/24   Sadie Manna, MD  torsemide  (DEMADEX ) 20 MG tablet Take 1 tablet (20 mg total) by mouth daily. 02/29/24   Barrett, Shona MATSU, PA-C  traMADol  (ULTRAM ) 50 MG tablet Take 1 tablet (50 mg total) by mouth every 6 (six) hours as needed. 02/29/24   Brien Belvie BRAVO, MD    Allergies: Patient  has no known allergies.    Review of Systems  Updated Vital Signs BP 131/86 (BP Location: Left Arm)   Pulse 62   Temp 98 F (36.7 C) (Oral)   Resp 16   SpO2 100%   Physical Exam Vitals and nursing note reviewed.  Constitutional:      General: He is not in acute distress.    Appearance: He is well-developed. He is not diaphoretic.  HENT:     Head: Normocephalic and atraumatic.  Eyes:     Conjunctiva/sclera: Conjunctivae normal.  Cardiovascular:     Rate and Rhythm: Normal rate and regular rhythm.     Heart sounds: Normal heart sounds. No murmur heard.    No friction rub. No  gallop.  Pulmonary:     Effort: Pulmonary effort is normal. No respiratory distress.     Breath sounds: Normal breath sounds. No wheezing or rales.  Abdominal:     General: There is no distension.     Palpations: Abdomen is soft.     Tenderness: There is no abdominal tenderness. There is no guarding.  Musculoskeletal:     Cervical back: Normal range of motion.     Right lower leg: Edema present.  Skin:    General: Skin is warm and dry.  Neurological:     Mental Status: He is alert and oriented to person, place, and time.     (all labs ordered are listed, but only abnormal results are displayed) Labs Reviewed  BASIC METABOLIC PANEL WITH GFR  CBC  PROTIME-INR  TROPONIN T, HIGH SENSITIVITY  TROPONIN T, HIGH SENSITIVITY    EKG: EKG Interpretation Date/Time:  Wednesday March 21 2024 11:55:46 EDT Ventricular Rate:  86 PR Interval:    QRS Duration:  97 QT Interval:  382 QTC Calculation: 457 R Axis:   16  Text Interpretation: Atrial flutter with predominant 3:1 AV block Abnormal T, consider ischemia, diffuse leads Minimal ST elevation, inferior leads No significant change since last tracing Confirmed by Dreama Longs ([id number]) on 03/21/2024 3:18:59 PM  Radiology: [facility] Chest 2 View Result Date: 03/21/2024 CLINICAL DATA:  pain EXAM: CHEST - 2 VIEW COMPARISON:  02/20/2024 FINDINGS: No focal airspace consolidation, pleural effusion, or pneumothorax. Moderate cardiomegaly. CABG markers and sternotomy wires. Tortuous aorta with aortic atherosclerosis. No acute fracture or destructive lesions. Multilevel thoracic osteophytosis. IMPRESSION: No acute cardiopulmonary abnormality. Electronically Signed   By: Rogelia Myers M.D.   On: 03/21/2024 12:50    {Document cardiac monitor, telemetry assessment procedure when appropriate:32947} Procedures   Medications Ordered in the ED - No data to display    {Click here for ABCD2, HEART and other calculators REFRESH Note before signing:1}                                68 year old male with history of coronary artery disease status post CABG x 2, heart failure with reduced ejection fraction last 45% in 2022, paroxysmal atrial fibrillation on Xarelto , hypertension, type 2 diabetes, CKD stage III, OSA, PE status post mechanical thrombectomy in 2022, recurrent PE in 2025, history of cocaine abuse, tobacco abuse, who presents with concern for chest pain.  Differential diagnosis for dyspnea includes ACS, PE, COPD exacerbation, CHF exacerbation, anemia, pneumonia, viral etiology such as COVID 19 infection, metabolic abnormality.    EKG interpreted by me with atrial flutter.   Chest x-ray completed and personally interpreted shows stable cardiomegaly, no acute pneumonia, pneumothorax or pulmonary  edema.  Labs completed and evaluated by me show no leukocytosis, no anemia, INR WNL  DVT study negative.      {Document critical care time when appropriate  Document review of labs and clinical decision tools ie CHADS2VASC2, etc  Document your independent review of radiology images and any outside records  Document your discussion with family members, caretakers and with consultants  Document social determinants of health affecting pt's care  Document your decision making why or why not admission, treatments were needed:32947:::1}   Final diagnoses:  None    ED Discharge Orders     None

## 2024-03-22 ENCOUNTER — Other Ambulatory Visit (HOSPITAL_COMMUNITY): Payer: Self-pay

## 2024-03-22 NOTE — ED Notes (Signed)
 Patient states, watch out for her, she is a Ambulance person. Referring to the RN in charge of his care.

## 2024-04-04 ENCOUNTER — Other Ambulatory Visit (HOSPITAL_COMMUNITY): Payer: Self-pay

## 2024-04-10 ENCOUNTER — Other Ambulatory Visit (HOSPITAL_COMMUNITY): Payer: Self-pay

## 2024-04-10 ENCOUNTER — Other Ambulatory Visit: Payer: Self-pay

## 2024-04-10 ENCOUNTER — Emergency Department (HOSPITAL_COMMUNITY)
Admission: EM | Admit: 2024-04-10 | Discharge: 2024-04-10 | Disposition: A | Discharge status: Home / Self Care | Attending: Emergency Medicine | Admitting: Emergency Medicine

## 2024-04-10 ENCOUNTER — Encounter (HOSPITAL_COMMUNITY): Payer: Self-pay | Admitting: Emergency Medicine

## 2024-04-10 DIAGNOSIS — I509 Heart failure, unspecified: Secondary | ICD-10-CM | POA: Diagnosis not present

## 2024-04-10 DIAGNOSIS — Z5900 Homelessness unspecified: Secondary | ICD-10-CM | POA: Insufficient documentation

## 2024-04-10 DIAGNOSIS — E119 Type 2 diabetes mellitus without complications: Secondary | ICD-10-CM | POA: Diagnosis not present

## 2024-04-10 DIAGNOSIS — Z79899 Other long term (current) drug therapy: Secondary | ICD-10-CM | POA: Insufficient documentation

## 2024-04-10 DIAGNOSIS — Z7984 Long term (current) use of oral hypoglycemic drugs: Secondary | ICD-10-CM | POA: Insufficient documentation

## 2024-04-10 DIAGNOSIS — F17210 Nicotine dependence, cigarettes, uncomplicated: Secondary | ICD-10-CM | POA: Insufficient documentation

## 2024-04-10 DIAGNOSIS — R109 Unspecified abdominal pain: Secondary | ICD-10-CM | POA: Insufficient documentation

## 2024-04-10 DIAGNOSIS — Z7901 Long term (current) use of anticoagulants: Secondary | ICD-10-CM | POA: Diagnosis not present

## 2024-04-10 DIAGNOSIS — I2510 Atherosclerotic heart disease of native coronary artery without angina pectoris: Secondary | ICD-10-CM | POA: Diagnosis not present

## 2024-04-10 DIAGNOSIS — I110 Hypertensive heart disease with heart failure: Secondary | ICD-10-CM | POA: Diagnosis not present

## 2024-04-10 MED ORDER — CARVEDILOL 6.25 MG PO TABS
6.2500 mg | ORAL_TABLET | Freq: Two times a day (BID) | ORAL | 3 refills | Status: DC
  Filled 2024-04-10: qty 60, 30d supply, fill #0

## 2024-04-10 MED ORDER — RIVAROXABAN 20 MG PO TABS
20.0000 mg | ORAL_TABLET | Freq: Every day | ORAL | 0 refills | Status: DC
  Filled 2024-04-10: qty 90, 90d supply, fill #0

## 2024-04-10 MED ORDER — ALBUTEROL SULFATE HFA 108 (90 BASE) MCG/ACT IN AERS
2.0000 | INHALATION_SPRAY | Freq: Four times a day (QID) | RESPIRATORY_TRACT | 2 refills | Status: DC | PRN
  Filled 2024-04-10: qty 6.7, 10d supply, fill #0

## 2024-04-10 MED ORDER — TORSEMIDE 20 MG PO TABS
20.0000 mg | ORAL_TABLET | Freq: Every day | ORAL | 3 refills | Status: DC
  Filled 2024-04-10: qty 90, 90d supply, fill #0

## 2024-04-10 MED ORDER — AMIODARONE HCL 200 MG PO TABS
200.0000 mg | ORAL_TABLET | Freq: Every day | ORAL | 0 refills | Status: DC
  Filled 2024-04-10: qty 90, 90d supply, fill #0

## 2024-04-10 MED ORDER — ALBUTEROL SULFATE HFA 108 (90 BASE) MCG/ACT IN AERS
1.0000 | INHALATION_SPRAY | Freq: Four times a day (QID) | RESPIRATORY_TRACT | Status: DC | PRN
  Filled 2024-04-10: qty 6.7

## 2024-04-10 MED ORDER — ATORVASTATIN CALCIUM 40 MG PO TABS
40.0000 mg | ORAL_TABLET | Freq: Every day | ORAL | 0 refills | Status: DC
  Filled 2024-04-10: qty 90, 90d supply, fill #0

## 2024-04-10 MED ORDER — SERTRALINE HCL 50 MG PO TABS
50.0000 mg | ORAL_TABLET | Freq: Every day | ORAL | 1 refills | Status: DC
  Filled 2024-04-10: qty 90, 90d supply, fill #0

## 2024-04-10 MED ORDER — ENTRESTO 24-26 MG PO TABS
1.0000 | ORAL_TABLET | Freq: Two times a day (BID) | ORAL | 3 refills | Status: DC
  Filled 2024-04-10: qty 60, 30d supply, fill #0

## 2024-04-10 MED ORDER — METFORMIN HCL 1000 MG PO TABS
1000.0000 mg | ORAL_TABLET | Freq: Every day | ORAL | 0 refills | Status: DC
  Filled 2024-04-10: qty 90, 90d supply, fill #0

## 2024-04-10 NOTE — ED Notes (Signed)
 Pt changed from wet clothing, provided clean dry clothing from donation closet

## 2024-04-10 NOTE — ED Notes (Addendum)
 Writer spoke with Dr Elnor concerning his complaint of lower abd pain. He states he is fine to go. Pt has had 2 sandwiches and 2 cups of coffee without GI concerns. Staff aware pt can leave.

## 2024-04-10 NOTE — ED Notes (Addendum)
 Pt bathed and placed in clean dry blue pt scrubs

## 2024-04-10 NOTE — ED Notes (Addendum)
 Pt telling staff when he gets wet he is coming right back. He has been provided with dry clothes, a poncho with his wet clothes bagged although he took them back out and put them on over his dry clothes. Pt was pushed to front in W/C with bus pass provided. SABRA

## 2024-04-10 NOTE — ED Notes (Signed)
 Provided pt with 2 sausage biscuits and coffee

## 2024-04-10 NOTE — ED Notes (Signed)
 Pt is asking to speak with provider prior to discharge

## 2024-04-10 NOTE — Discharge Instructions (Addendum)
 It was a pleasure caring for you today in the emergency department.  Please return to the emergency department for any worsening or worrisome symptoms.    RESOURCE GUIDE  Chronic Pain Problems: Contact [facility]  [phone] Patients need to be referred by their primary care doctor.  Insufficient Money for Medicine: Contact [employer]:  call [phone]  No Primary Care Doctor: Call [facility]  [phone] - can help you locate a primary care doctor that  accepts your insurance, provides certain services, etc. Physician Referral Service- [phone]  Agencies that provide inexpensive medical care: [facility]  [phone] [facility]  [phone] [facility]  [phone] [facility]  [phone] [facility]  [phone] [facility]  [phone]  Medicaid-accepting [facility] Providers: [facility]- [address]  [phone], Mon-Fri 9am-7pm, Sat 9am-1pm [facility]- [address] [place], Suite [place]  [phone] [facility]- [address] [place]  [phone] [facility]- [address]  [phone] Jonathon Neighbors- [address] [place], [address], [phone]  Only accepts [name] patients after they have their name  applied to their card  Self Pay (no insurance) in [facility]: Sickle Cell Patients - [facility]  [address] [place], [phone] [facility]- [address] [place]  [phone]       [facility]- [address]       -     [facility]- see information above (Speak to [employer] if you do not have insurance)       -  [facility]- [address],  [phone]       -  [facility]- [address] [phone]       -  Dr Sherlene Diss-  [address], [place], [phone]       -   Urgent Medical and [facility] - [address] [phone]       -  [facility]- [address], [phone], also [address] [phone]       -     [facility]- [address] [place], [phone], 1st & 3rd Saturday         every month, 10am-1pm  -     [facility] and [facility]   [address] [place], [place].   Phone:  [phone], Fax:  [phone]. Hours of Operation:  9 am - 6 pm, M-F.  -     [facility]   [address], [place]   Phone: [phone], Fax: [phone]. Hours of Operation:  8:30 am - 5:30 pm, M-F.    Dental Assistance If unable to pay or uninsured, contact:  [facility]. to become qualified for the adult dental clinic.  Patients with Medicaid: [facility] [phone] [address], [phone] [address] [phone]  If unable to pay, or uninsured, contact [facility] [phone] in [place], [phone] in [facility]) to become qualified for the adult dental clinic  [facility] [address] [place], [place] [address] [phone] [email or web address]  Other Proofreader Services: [facility]- [address] [place], [place], [address], [phone], Ext. [phone], 2nd  and 4th Thursday of the month at 6:30am.  10 clients each day by appointment, can sometimes see walk-in patients if someone does not show for an appointment. [facility]- [address] [place], [place], [address], [phone] [facility] [address] [place], [place], [address], [phone] [facility] Department- [phone] [facility] Health Department- [phone] [facility] [phone]
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
## 2024-04-10 NOTE — ED Notes (Signed)
Inhaler given.
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
## 2024-04-11 ENCOUNTER — Telehealth: Admitting: Nurse Practitioner

## 2024-04-11 ENCOUNTER — Other Ambulatory Visit (HOSPITAL_COMMUNITY): Payer: Self-pay

## 2024-04-11 DIAGNOSIS — I509 Heart failure, unspecified: Secondary | ICD-10-CM | POA: Diagnosis not present

## 2024-04-11 DIAGNOSIS — I10 Essential (primary) hypertension: Secondary | ICD-10-CM

## 2024-04-11 DIAGNOSIS — J449 Chronic obstructive pulmonary disease, unspecified: Secondary | ICD-10-CM | POA: Diagnosis not present

## 2024-04-11 DIAGNOSIS — F339 Major depressive disorder, recurrent, unspecified: Secondary | ICD-10-CM

## 2024-04-11 DIAGNOSIS — I4811 Longstanding persistent atrial fibrillation: Secondary | ICD-10-CM | POA: Diagnosis not present

## 2024-04-11 DIAGNOSIS — E119 Type 2 diabetes mellitus without complications: Secondary | ICD-10-CM

## 2024-04-11 DIAGNOSIS — E782 Mixed hyperlipidemia: Secondary | ICD-10-CM

## 2024-04-11 MED ORDER — ATORVASTATIN CALCIUM 40 MG PO TABS
40.0000 mg | ORAL_TABLET | Freq: Every day | ORAL | 0 refills | Status: DC
  Filled 2024-04-11: qty 30, 30d supply, fill #0

## 2024-04-11 MED ORDER — SACUBITRIL-VALSARTAN 24-26 MG PO TABS
1.0000 | ORAL_TABLET | Freq: Two times a day (BID) | ORAL | 0 refills | Status: DC
  Filled 2024-04-11: qty 30, 15d supply, fill #0

## 2024-04-11 MED ORDER — ALBUTEROL SULFATE HFA 108 (90 BASE) MCG/ACT IN AERS
2.0000 | INHALATION_SPRAY | Freq: Four times a day (QID) | RESPIRATORY_TRACT | 0 refills | Status: DC | PRN
  Filled 2024-04-11: qty 6.7, 10d supply, fill #0

## 2024-04-11 MED ORDER — SERTRALINE HCL 50 MG PO TABS
50.0000 mg | ORAL_TABLET | Freq: Every day | ORAL | 0 refills | Status: DC
  Filled 2024-04-11: qty 30, 30d supply, fill #0

## 2024-04-11 MED ORDER — CARVEDILOL 6.25 MG PO TABS
6.2500 mg | ORAL_TABLET | Freq: Two times a day (BID) | ORAL | 0 refills | Status: DC
  Filled 2024-04-11: qty 30, 15d supply, fill #0

## 2024-04-11 MED ORDER — RIVAROXABAN 20 MG PO TABS
20.0000 mg | ORAL_TABLET | Freq: Every day | ORAL | 0 refills | Status: DC
  Filled 2024-04-11: qty 30, 30d supply, fill #0

## 2024-04-11 MED ORDER — TORSEMIDE 20 MG PO TABS
20.0000 mg | ORAL_TABLET | Freq: Every day | ORAL | 0 refills | Status: DC
  Filled 2024-04-11: qty 30, 30d supply, fill #0

## 2024-04-11 MED ORDER — AMIODARONE HCL 200 MG PO TABS
200.0000 mg | ORAL_TABLET | Freq: Every day | ORAL | 0 refills | Status: DC
  Filled 2024-04-11: qty 30, 30d supply, fill #0

## 2024-04-11 MED ORDER — METFORMIN HCL 1000 MG PO TABS
1000.0000 mg | ORAL_TABLET | Freq: Every day | ORAL | 0 refills | Status: DC
  Filled 2024-04-11: qty 30, 30d supply, fill #0

## 2024-04-11 NOTE — Progress Notes (Signed)
 Acute Video Visit    Virtual Visit Consent:   Jeremy [name], you are scheduled for a virtual visit with a [facility] provider today.     Just as with appointments in the office, your consent must be obtained to participate.  Your consent will be active for this visit and any virtual visit you may have with one of our providers in the next 365 days.     If you have a MyChart account, a copy of this consent can be sent to you electronically.  All virtual visits are billed to your insurance company just like a traditional visit in the office.    If the connection with a video visit is poor, the visit may have to be switched to a telephone visit.  With either a video or telephone visit, we are not always able to ensure that we have a secure connection.     I need to obtain your verbal consent now.   Are you willing to proceed with your visit today?    Jeremy [name] has provided verbal consent on 04/11/2024 for a virtual visit (video or telephone).   [name], FNP  Date: 04/11/2024 12:21 PM  Subjective:     Patient ID: Jeremy [name], male    DOB: [birth year], 68 y.o.   MRN: [id number]  LILLETTE [name], connected with  Rainier Feuerborn  ([id number], 10-13-1955) on 04/11/24 at 12:20 PM EDT by a video-enabled telemedicine application and verified that I am speaking with the correct person using two identifiers.   Location: Patient: [facility]  Provider: Virtual Visit Location Provider: Home Office   I discussed the limitations of evaluation and management by telemedicine and the availability of in person appointments. The patient expressed understanding and agreed to proceed.     HPI Jeremy [name] is a 68 y.o. who identifies as a male who was assigned male at birth, and is being seen today for assistance with lost medications.   He was in the ED yesterday, rode the bus home and left all of his medications on the bus. He went to the ED with complaints of being homeless and cold in  the rain and was provided with a refill on medications at that time.   Medical history is significant for CHF, CAD, HTN  Denies any acute needs, is seeking assistance at [facility] today        Objective:     Physical Exam Constitutional:      General: He is not in acute distress.    Appearance: Normal appearance. He is not ill-appearing.  HENT:     Nose: Nose normal.     Mouth/Throat:     Mouth: Mucous membranes are moist.  Pulmonary:     Effort: Pulmonary effort is normal.  Neurological:     Mental Status: He is alert and oriented to person, place, and time.         Assessment & Plan:    30 day refill of medications that were dispensed yesterday provided for patient. He is scheduled in one month with internal medicine, may return to [facility] or use [facility] as needed prior to that appointment   1. Congestive heart failure, unspecified HF chronicity, unspecified heart failure type (HCC) (Primary)  - amiodarone  (PACERONE ) 200 MG tablet; Take 1 tablet (200 mg total) by mouth daily.  Dispense: 30 tablet; Refill: 0 - carvedilol  (COREG ) 6.25 MG tablet; Take 1 tablet (6.25 mg total) by mouth 2 (two) times daily.  Dispense: 30 tablet;  Refill: 0 - sacubitril -valsartan  (ENTRESTO ) 24-26 MG; Take 1 tablet by mouth 2 (two) times daily.  Dispense: 30 tablet; Refill: 0 - torsemide  (DEMADEX ) 20 MG tablet; Take 1 tablet (20 mg total) by mouth daily.  Dispense: 30 tablet; Refill: 0  2. Benign essential HTN   3. Chronic obstructive pulmonary disease, unspecified COPD type (HCC)  - albuterol  (VENTOLIN  HFA) 108 (90 Base) MCG/ACT inhaler; Inhale 2 puffs into the lungs every 6 (six) hours as needed for wheezing or shortness of breath.  Dispense: 6.7 g; Refill: 0  4. Longstanding persistent atrial fibrillation (HCC)  - amiodarone  (PACERONE ) 200 MG tablet; Take 1 tablet (200 mg total) by mouth daily.  Dispense: 30 tablet; Refill: 0 - rivaroxaban  (XARELTO ) 20 MG TABS tablet; Take 1 tablet (20 mg total) by  mouth daily at 5 pm  Dispense: 30 tablet; Refill: 0  5. Mixed hyperlipidemia  - atorvastatin  (LIPITOR) 40 MG tablet; Take 1 tablet (40 mg total) by mouth daily.  Dispense: 30 tablet; Refill: 0  6. Diabetes mellitus without complication (HCC)  - metFORMIN  (GLUCOPHAGE ) 1000 MG tablet; Take 1 tablet (1,000 mg total) by mouth daily with breakfast.  Dispense: 30 tablet; Refill: 0  7. Depression, recurrent  - sertraline  (ZOLOFT ) 50 MG tablet; Take 1 tablet (50 mg total) by mouth daily.  Dispense: 30 tablet; Refill: 0   Follow Up Instructions: I discussed the assessment and treatment plan with the patient. The patient was provided an opportunity to ask questions and all were answered. The patient agreed with the plan and demonstrated an understanding of the instructions.  A copy of instructions were sent to the patient via MyChart unless otherwise noted below.     The patient was advised to call back or seek an in-person evaluation if the symptoms worsen or if the condition fails to improve as anticipated.    [name], FNP  **Disclaimer: This note may have been dictated with voice recognition software. Similar sounding words can inadvertently be transcribed and this note may contain transcription errors which may not have been corrected upon publication of note.**

## 2024-04-12 ENCOUNTER — Other Ambulatory Visit (HOSPITAL_COMMUNITY): Payer: Self-pay

## 2024-04-25 ENCOUNTER — Other Ambulatory Visit: Payer: Self-pay

## 2024-04-25 ENCOUNTER — Emergency Department (HOSPITAL_COMMUNITY)

## 2024-04-25 ENCOUNTER — Encounter (HOSPITAL_COMMUNITY): Payer: Self-pay | Admitting: Internal Medicine

## 2024-04-25 ENCOUNTER — Inpatient Hospital Stay (HOSPITAL_COMMUNITY)
Admission: EM | Admit: 2024-04-25 | Discharge: 2024-04-30 | DRG: 871 | Disposition: A | Discharge status: Home / Self Care | Attending: Internal Medicine | Admitting: Internal Medicine

## 2024-04-25 DIAGNOSIS — I252 Old myocardial infarction: Secondary | ICD-10-CM

## 2024-04-25 DIAGNOSIS — F1290 Cannabis use, unspecified, uncomplicated: Secondary | ICD-10-CM | POA: Diagnosis present

## 2024-04-25 DIAGNOSIS — R410 Disorientation, unspecified: Principal | ICD-10-CM

## 2024-04-25 DIAGNOSIS — J449 Chronic obstructive pulmonary disease, unspecified: Secondary | ICD-10-CM | POA: Diagnosis present

## 2024-04-25 DIAGNOSIS — Z7984 Long term (current) use of oral hypoglycemic drugs: Secondary | ICD-10-CM | POA: Diagnosis not present

## 2024-04-25 DIAGNOSIS — N390 Urinary tract infection, site not specified: Secondary | ICD-10-CM

## 2024-04-25 DIAGNOSIS — F4321 Adjustment disorder with depressed mood: Secondary | ICD-10-CM | POA: Diagnosis present

## 2024-04-25 DIAGNOSIS — E785 Hyperlipidemia, unspecified: Secondary | ICD-10-CM | POA: Diagnosis present

## 2024-04-25 DIAGNOSIS — B9620 Unspecified Escherichia coli [E. coli] as the cause of diseases classified elsewhere: Secondary | ICD-10-CM | POA: Diagnosis present

## 2024-04-25 DIAGNOSIS — F17210 Nicotine dependence, cigarettes, uncomplicated: Secondary | ICD-10-CM | POA: Diagnosis present

## 2024-04-25 DIAGNOSIS — E1122 Type 2 diabetes mellitus with diabetic chronic kidney disease: Secondary | ICD-10-CM | POA: Diagnosis present

## 2024-04-25 DIAGNOSIS — K219 Gastro-esophageal reflux disease without esophagitis: Secondary | ICD-10-CM | POA: Diagnosis present

## 2024-04-25 DIAGNOSIS — Z79899 Other long term (current) drug therapy: Secondary | ICD-10-CM

## 2024-04-25 DIAGNOSIS — F1090 Alcohol use, unspecified, uncomplicated: Secondary | ICD-10-CM | POA: Diagnosis present

## 2024-04-25 DIAGNOSIS — R32 Unspecified urinary incontinence: Secondary | ICD-10-CM | POA: Diagnosis present

## 2024-04-25 DIAGNOSIS — I5022 Chronic systolic (congestive) heart failure: Secondary | ICD-10-CM | POA: Diagnosis present

## 2024-04-25 DIAGNOSIS — A419 Sepsis, unspecified organism: Secondary | ICD-10-CM | POA: Diagnosis present

## 2024-04-25 DIAGNOSIS — Z1152 Encounter for screening for COVID-19: Secondary | ICD-10-CM | POA: Diagnosis not present

## 2024-04-25 DIAGNOSIS — Z7901 Long term (current) use of anticoagulants: Secondary | ICD-10-CM

## 2024-04-25 DIAGNOSIS — Z5902 Unsheltered homelessness: Secondary | ICD-10-CM

## 2024-04-25 DIAGNOSIS — G9341 Metabolic encephalopathy: Secondary | ICD-10-CM | POA: Diagnosis present

## 2024-04-25 DIAGNOSIS — I482 Chronic atrial fibrillation, unspecified: Secondary | ICD-10-CM | POA: Diagnosis present

## 2024-04-25 DIAGNOSIS — N1831 Chronic kidney disease, stage 3a: Secondary | ICD-10-CM | POA: Diagnosis present

## 2024-04-25 DIAGNOSIS — I480 Paroxysmal atrial fibrillation: Secondary | ICD-10-CM | POA: Diagnosis present

## 2024-04-25 DIAGNOSIS — I130 Hypertensive heart and chronic kidney disease with heart failure and stage 1 through stage 4 chronic kidney disease, or unspecified chronic kidney disease: Secondary | ICD-10-CM | POA: Diagnosis present

## 2024-04-25 DIAGNOSIS — E119 Type 2 diabetes mellitus without complications: Secondary | ICD-10-CM

## 2024-04-25 DIAGNOSIS — I2510 Atherosclerotic heart disease of native coronary artery without angina pectoris: Secondary | ICD-10-CM | POA: Diagnosis present

## 2024-04-25 DIAGNOSIS — Z8619 Personal history of other infectious and parasitic diseases: Secondary | ICD-10-CM

## 2024-04-25 DIAGNOSIS — Z8249 Family history of ischemic heart disease and other diseases of the circulatory system: Secondary | ICD-10-CM

## 2024-04-25 DIAGNOSIS — I700 Atherosclerosis of aorta: Secondary | ICD-10-CM | POA: Diagnosis present

## 2024-04-25 DIAGNOSIS — N3000 Acute cystitis without hematuria: Secondary | ICD-10-CM | POA: Diagnosis present

## 2024-04-25 DIAGNOSIS — Z86711 Personal history of pulmonary embolism: Secondary | ICD-10-CM

## 2024-04-25 DIAGNOSIS — Z87440 Personal history of urinary (tract) infections: Secondary | ICD-10-CM

## 2024-04-25 DIAGNOSIS — I10 Essential (primary) hypertension: Secondary | ICD-10-CM | POA: Diagnosis present

## 2024-04-25 DIAGNOSIS — E66811 Obesity, class 1: Secondary | ICD-10-CM | POA: Diagnosis present

## 2024-04-25 DIAGNOSIS — Z6833 Body mass index (BMI) 33.0-33.9, adult: Secondary | ICD-10-CM

## 2024-04-25 DIAGNOSIS — Z91199 Patient's noncompliance with other medical treatment and regimen due to unspecified reason: Secondary | ICD-10-CM

## 2024-04-25 DIAGNOSIS — Z951 Presence of aortocoronary bypass graft: Secondary | ICD-10-CM

## 2024-04-25 LAB — URINALYSIS, ROUTINE W REFLEX MICROSCOPIC
Bilirubin Urine: NEGATIVE
Glucose, UA: NEGATIVE mg/dL
Ketones, ur: NEGATIVE mg/dL
Nitrite: POSITIVE — AB
Protein, ur: NEGATIVE mg/dL
Specific Gravity, Urine: 1.012 (ref 1.005–1.030)
pH: 5 (ref 5.0–8.0)

## 2024-04-25 LAB — COMPREHENSIVE METABOLIC PANEL WITH GFR
ALT: 8 U/L (ref 0–44)
AST: 16 U/L (ref 15–41)
Albumin: 4.1 g/dL (ref 3.5–5.0)
Alkaline Phosphatase: 76 U/L (ref 38–126)
Anion gap: 10 (ref 5–15)
BUN: 19 mg/dL (ref 8–23)
CO2: 21 mmol/L — ABNORMAL LOW (ref 22–32)
Calcium: 9.7 mg/dL (ref 8.9–10.3)
Chloride: 106 mmol/L (ref 98–111)
Creatinine, Ser: 1.48 mg/dL — ABNORMAL HIGH (ref 0.61–1.24)
GFR, Estimated: 52 mL/min — ABNORMAL LOW (ref >60)
Glucose, Bld: 96 mg/dL (ref 70–99)
Potassium: 4.2 mmol/L (ref 3.5–5.1)
Sodium: 138 mmol/L (ref 135–145)
Total Bilirubin: 0.5 mg/dL (ref 0.0–1.2)
Total Protein: 7.4 g/dL (ref 6.5–8.1)

## 2024-04-25 LAB — CBC WITH DIFFERENTIAL/PLATELET
Abs Immature Granulocytes: 0.05 K/uL (ref 0.00–0.07)
Basophils Absolute: 0.1 K/uL (ref 0.0–0.1)
Basophils Relative: 1 %
Eosinophils Absolute: 0.1 K/uL (ref 0.0–0.5)
Eosinophils Relative: 1 %
HCT: 42.6 % (ref 39.0–52.0)
Hemoglobin: 13.9 g/dL (ref 13.0–17.0)
Immature Granulocytes: 1 %
Lymphocytes Relative: 20 %
Lymphs Abs: 2.2 K/uL (ref 0.7–4.0)
MCH: 31.6 pg (ref 26.0–34.0)
MCHC: 32.6 g/dL (ref 30.0–36.0)
MCV: 96.8 fL (ref 80.0–100.0)
Monocytes Absolute: 1.3 K/uL — ABNORMAL HIGH (ref 0.1–1.0)
Monocytes Relative: 11 %
Neutro Abs: 7.3 K/uL (ref 1.7–7.7)
Neutrophils Relative %: 66 %
Platelets: 232 K/uL (ref 150–400)
RBC: 4.4 MIL/uL (ref 4.22–5.81)
RDW: 14.4 % (ref 11.5–15.5)
WBC: 10.9 K/uL — ABNORMAL HIGH (ref 4.0–10.5)
nRBC: 0 % (ref 0.0–0.2)

## 2024-04-25 LAB — RESP PANEL BY RT-PCR (RSV, FLU A&B, COVID)  RVPGX2
Influenza A by PCR: NEGATIVE
Influenza B by PCR: NEGATIVE
Resp Syncytial Virus by PCR: NEGATIVE
SARS Coronavirus 2 by RT PCR: NEGATIVE

## 2024-04-25 LAB — PROTIME-INR
INR: 1 (ref 0.8–1.2)
Prothrombin Time: 13.7 s (ref 11.4–15.2)

## 2024-04-25 LAB — GLUCOSE, CAPILLARY: Glucose-Capillary: 101 mg/dL — ABNORMAL HIGH (ref 70–99)

## 2024-04-25 LAB — I-STAT CG4 LACTIC ACID, ED: Lactic Acid, Venous: 0.9 mmol/L (ref 0.5–1.9)

## 2024-04-25 MED ORDER — ATORVASTATIN CALCIUM 40 MG PO TABS
40.0000 mg | ORAL_TABLET | Freq: Every day | ORAL | Status: DC
  Administered 2024-04-26 – 2024-04-30 (×5): 40 mg via ORAL
  Filled 2024-04-25 (×5): qty 1

## 2024-04-25 MED ORDER — AMIODARONE HCL 200 MG PO TABS
200.0000 mg | ORAL_TABLET | Freq: Every day | ORAL | Status: DC
  Administered 2024-04-25 – 2024-04-30 (×6): 200 mg via ORAL
  Filled 2024-04-25 (×6): qty 1

## 2024-04-25 MED ORDER — SODIUM CHLORIDE 0.9 % IV SOLN: 2.0000 g | INTRAVENOUS | Status: DC

## 2024-04-25 MED ORDER — NITROGLYCERIN 0.4 MG SL SUBL
0.4000 mg | SUBLINGUAL_TABLET | SUBLINGUAL | Status: DC | PRN
Start: 2024-04-25 — End: 2024-04-30

## 2024-04-25 MED ORDER — ACETAMINOPHEN 325 MG PO TABS
650.0000 mg | ORAL_TABLET | Freq: Four times a day (QID) | ORAL | Status: DC | PRN
  Administered 2024-04-27 – 2024-04-29 (×3): 650 mg via ORAL
  Filled 2024-04-25 (×3): qty 2

## 2024-04-25 MED ORDER — ACETAMINOPHEN 325 MG PO TABS
650.0000 mg | ORAL_TABLET | Freq: Once | ORAL | Status: AC
  Administered 2024-04-25: 650 mg via ORAL
  Filled 2024-04-25: qty 2

## 2024-04-25 MED ORDER — ORAL CARE MOUTH RINSE: 15.0000 mL | OROMUCOSAL | Status: DC | PRN

## 2024-04-25 MED ORDER — ACETAMINOPHEN 650 MG RE SUPP: 650.0000 mg | Freq: Four times a day (QID) | RECTAL | Status: DC | PRN

## 2024-04-25 MED ORDER — TORSEMIDE 20 MG PO TABS
20.0000 mg | ORAL_TABLET | Freq: Every day | ORAL | Status: DC
  Administered 2024-04-26 – 2024-04-30 (×5): 20 mg via ORAL
  Filled 2024-04-25 (×5): qty 1

## 2024-04-25 MED ORDER — SERTRALINE HCL 50 MG PO TABS
50.0000 mg | ORAL_TABLET | Freq: Every day | ORAL | Status: DC
  Administered 2024-04-25 – 2024-04-30 (×6): 50 mg via ORAL
  Filled 2024-04-25 (×6): qty 1

## 2024-04-25 MED ORDER — METFORMIN HCL 500 MG PO TABS
1000.0000 mg | ORAL_TABLET | Freq: Every day | ORAL | Status: DC
  Administered 2024-04-26 – 2024-04-30 (×5): 1000 mg via ORAL
  Filled 2024-04-25 (×5): qty 2

## 2024-04-25 MED ORDER — ONDANSETRON HCL 4 MG/2ML IJ SOLN
4.0000 mg | Freq: Four times a day (QID) | INTRAMUSCULAR | Status: DC | PRN
Start: 2024-04-25 — End: 2024-04-30

## 2024-04-25 MED ORDER — CARVEDILOL 6.25 MG PO TABS
6.2500 mg | ORAL_TABLET | Freq: Two times a day (BID) | ORAL | Status: DC
  Administered 2024-04-25 – 2024-04-30 (×9): 6.25 mg via ORAL
  Filled 2024-04-25 (×9): qty 1

## 2024-04-25 MED ORDER — SODIUM CHLORIDE 0.9 % IV BOLUS
500.0000 mL | Freq: Once | INTRAVENOUS | Status: AC
  Administered 2024-04-25: 500 mL via INTRAVENOUS

## 2024-04-25 MED ORDER — LORAZEPAM 1 MG PO TABS: 1.0000 mg | ORAL_TABLET | Freq: Once | ORAL | Status: DC

## 2024-04-25 MED ORDER — SODIUM CHLORIDE 0.9 % IV SOLN
1.0000 g | Freq: Three times a day (TID) | INTRAVENOUS | Status: DC
  Administered 2024-04-26 – 2024-04-30 (×13): 1 g via INTRAVENOUS
  Filled 2024-04-25 (×15): qty 20

## 2024-04-25 MED ORDER — RIVAROXABAN 20 MG PO TABS
20.0000 mg | ORAL_TABLET | Freq: Every day | ORAL | Status: DC
  Administered 2024-04-25 – 2024-04-30 (×6): 20 mg via ORAL
  Filled 2024-04-25 (×6): qty 1

## 2024-04-25 MED ORDER — SODIUM CHLORIDE 0.9 % IV SOLN
2.0000 g | Freq: Once | INTRAVENOUS | Status: AC
  Administered 2024-04-25: 2 g via INTRAVENOUS
  Filled 2024-04-25: qty 20

## 2024-04-25 MED ORDER — ONDANSETRON HCL 4 MG PO TABS: 4.0000 mg | ORAL_TABLET | Freq: Four times a day (QID) | ORAL | Status: DC | PRN

## 2024-04-25 MED ORDER — SODIUM CHLORIDE 0.9 % IV SOLN: INTRAVENOUS | Status: AC

## 2024-04-25 MED ORDER — ENSURE PLUS HIGH PROTEIN PO LIQD
237.0000 mL | Freq: Two times a day (BID) | ORAL | Status: DC
  Administered 2024-04-28 – 2024-04-30 (×5): 237 mL via ORAL

## 2024-04-25 MED ORDER — SACUBITRIL-VALSARTAN 24-26 MG PO TABS
1.0000 | ORAL_TABLET | Freq: Two times a day (BID) | ORAL | Status: DC
  Administered 2024-04-25 – 2024-04-30 (×9): 1 via ORAL
  Filled 2024-04-25 (×10): qty 1

## 2024-04-25 NOTE — ED Notes (Signed)
 Care delay: pt in x ray currently

## 2024-04-25 NOTE — ED Notes (Signed)
 Verbal report called to 5 east nurse

## 2024-04-25 NOTE — ED Notes (Signed)
 Pt urinated in bed. Sheets changed. Condom catheter applied

## 2024-04-25 NOTE — Plan of Care (Signed)
  Problem: Nutrition: Goal: Adequate nutrition will be maintained Outcome: Progressing   Problem: Elimination: Goal: Will not experience complications related to urinary retention Outcome: Progressing   Problem: Pain Managment: Goal: General experience of comfort will improve and/or be controlled Outcome: Progressing   Problem: Skin Integrity: Goal: Risk for impaired skin integrity will decrease Outcome: Progressing

## 2024-04-25 NOTE — ED Notes (Signed)
 Pt continues to bend arm; delay in abd administration

## 2024-04-25 NOTE — ED Notes (Signed)
 USIV consulted

## 2024-04-25 NOTE — ED Notes (Signed)
 Care delay: unable to obtain urine sample; pt urinated onto floor

## 2024-04-25 NOTE — ED Provider Notes (Signed)
 [place] EMERGENCY DEPARTMENT AT [facility] Provider Note   CSN: [id number] Arrival date & time: 04/25/24  9248     Patient presents with: No chief complaint on file.   Jeremy [name] is a 68 y.o. male.   68 y.o male with a PMH of CABG. DM, Afib anticoagulated on Xarelto  [place] due to urinary incontinence.  Currently lives outside of church, he was noted to be short of breath, did have urinary incontinence throughout the night although he was normal yesterday.  Was also seen to be dizzy.  When asked what is wrong patient tells me that he does not know where he is.  However, was answering questions to nursing staff prior to me arriving in the room.  He is febrile on arrival with a temperature of 100.7, tachycardic in the 120s. Level 5 caveat due to mental status.   The history is provided by the patient.       Prior to Admission medications   Medication Sig Start Date End Date Taking? Authorizing Provider  acetaminophen  (TYLENOL ) 500 MG tablet Take 1 tablet (500 mg total) by mouth every 6 (six) hours as needed. Patient not taking: Reported on 10/27/2023 10/23/23   Mannie Pac T, DO  albuterol  (VENTOLIN  HFA) [phone] Base) MCG/ACT inhaler Inhale 2 puffs into the lungs every 6 (six) hours as needed for wheezing or shortness of breath. 04/11/24   Kennyth Domino, FNP  amiodarone  (PACERONE ) 200 MG tablet Take 1 tablet (200 mg total) by mouth daily. 04/11/24 05/11/24  Kennyth Domino, FNP  amoxicillin -clavulanate (AUGMENTIN) 875-125 MG tablet Take 1 tablet by mouth every 12 (twelve) hours. 03/21/24   Dreama Longs, MD  atorvastatin  (LIPITOR) 40 MG tablet Take 1 tablet (40 mg total) by mouth daily. 04/11/24   Kennyth Domino, FNP  carvedilol  (COREG ) 6.25 MG tablet Take 1 tablet (6.25 mg total) by mouth 2 (two) times daily. 04/11/24   Kennyth Domino, FNP  doxycycline  (VIBRAMYCIN ) 100 MG capsule Take 1 capsule (100 mg total) by mouth 2 (two) times daily.  03/21/24   Dreama Longs, MD  metFORMIN  (GLUCOPHAGE ) 1000 MG tablet Take 1 tablet (1,000 mg total) by mouth daily with breakfast. 04/11/24 05/11/24  Kennyth Domino, FNP  nitroGLYCERIN  (NITROSTAT ) 0.4 MG SL tablet Place 1 tablet (0.4 mg total) under the tongue every 5 (five) minutes as needed for chest pain up to 3 doses. 12/27/23   Raenelle Coria, MD  rivaroxaban  (XARELTO ) 20 MG TABS tablet Take 1 tablet (20 mg total) by mouth daily at 5 pm 04/11/24 05/11/24  Kennyth Domino, FNP  sacubitril -valsartan  (ENTRESTO ) 24-26 MG Take 1 tablet by mouth 2 (two) times daily. 04/11/24   Kennyth Domino, FNP  sertraline  (ZOLOFT ) 50 MG tablet Take 1 tablet (50 mg total) by mouth daily. 04/11/24   Kennyth Domino, FNP  torsemide  (DEMADEX ) 20 MG tablet Take 1 tablet (20 mg total) by mouth daily. 04/11/24   Kennyth Domino, FNP  traMADol  (ULTRAM ) 50 MG tablet Take 1 tablet (50 mg total) by mouth every 6 (six) hours as needed. 02/29/24   Brien Belvie BRAVO, MD    Allergies: Patient has no known allergies.    Review of Systems  Constitutional:  Negative for chills and fever.  Respiratory:  Positive for shortness of breath.   Cardiovascular:  Negative for chest pain.  Gastrointestinal:  Negative for abdominal pain.  Genitourinary:  Positive for difficulty urinating.  All other systems reviewed and are negative.   Updated Vital  Signs BP (!) 134/93   Pulse (!) 120   Temp (!) 97.5 F (36.4 C)   Resp 17   Ht 5' 11 (1.803 m)   Wt 107.4 kg   SpO2 99%   BMI 33.02 kg/m   Physical Exam Vitals and nursing note reviewed.  HENT:     Head: Normocephalic.     Mouth/Throat:     Mouth: Mucous membranes are dry.  Eyes:     Pupils: Pupils are equal, round, and reactive to light.  Cardiovascular:     Rate and Rhythm: Tachycardia present.  Pulmonary:     Effort: Pulmonary effort is normal.     Breath sounds: No wheezing or rales.  Abdominal:     General: Abdomen is flat.  Musculoskeletal:     Cervical back: Normal  range of motion and neck supple.  Skin:    General: Skin is warm and dry.  Neurological:     Mental Status: He is alert. He is disoriented.     (all labs ordered are listed, but only abnormal results are displayed) Labs Reviewed  CBC WITH DIFFERENTIAL/PLATELET - Abnormal; Notable for the following components:      Result Value   WBC 10.9 (*)    Monocytes Absolute 1.3 (*)    All other components within normal limits  COMPREHENSIVE METABOLIC PANEL WITH GFR - Abnormal; Notable for the following components:   CO2 21 (*)    Creatinine, Ser 1.48 (*)    GFR, Estimated 52 (*)    All other components within normal limits  URINALYSIS, ROUTINE W REFLEX MICROSCOPIC - Abnormal; Notable for the following components:   Hgb urine dipstick SMALL (*)    Nitrite POSITIVE (*)    Leukocytes,Ua SMALL (*)    Bacteria, UA MANY (*)    All other components within normal limits  RESP PANEL BY RT-PCR (RSV, FLU A&B, COVID)  RVPGX2  URINE CULTURE  CULTURE, BLOOD (ROUTINE X 2)  CULTURE, BLOOD (ROUTINE X 2)  PROTIME-INR  I-STAT CG4 LACTIC ACID, ED    EKG: None  Radiology: CT HEAD WO CONTRAST ( ) Result Date: 04/25/2024 EXAM: CT HEAD WITHOUT CONTRAST 04/25/2024 09:19:22 AM TECHNIQUE: CT of the head was performed without the administration of intravenous contrast. Automated exposure control, iterative reconstruction, and/or weight based adjustment of the mA/kV was utilized to reduce the radiation dose to as low as reasonably achievable. COMPARISON: Head CT 03/21/2024 and MRI 01/02/2021. CLINICAL HISTORY: Delirium. FINDINGS: The examination is mildly motion degraded. BRAIN AND VENTRICLES: No acute large territory infarct, cranial hemorrhage, mass, midline shift, hydrocephalus, or extra-axial fluid collection is identified. There is mild cerebral atrophy. Patchy and confluent hypodensities in the cerebral white matter bilaterally are similar to the prior CT and nonspecific but compatible with severe chronic  small vessel ischemic disease. Chronic lacunar infarcts are again noted in the bilateral cerebral white matter, right basal ganglia, and both cerebellar hemispheres. Calcified atherosclerosis at the skull base. ORBITS: No acute abnormality. SINUSES: Trace left mastoid fluid. The included paranasal sinuses are well aerated. SOFT TISSUES AND SKULL: No acute soft tissue abnormality. No skull fracture. IMPRESSION: 1. No acute intracranial abnormality. 2. Severe chronic small vessel ischemic disease. Electronically signed by: Dasie Hamburg MD 04/25/2024 09:27 AM EST RP Workstation: [id number]   DG Chest 2 View Result Date: 04/25/2024 EXAM: 2 VIEW(S) XRAY OF THE CHEST 04/25/2024 08:38:40 AM COMPARISON: 03/21/2024 CLINICAL HISTORY: fever FINDINGS: LUNGS AND PLEURA: Low lung volumes. Interstitial opacities throughout both lungs. No pulmonary edema. No  pleural effusion. No pneumothorax. HEART AND MEDIASTINUM: Mild cardiomegaly. Tortuous aorta with CABG markers and sternotomy wires. BONES AND SOFT TISSUES: Osteophytosis. IMPRESSION: 1. Interstitial opacities throughout both lungs, which may reflect bronchovascular crowding due to low lung volumes, interstitial edema, or atypical/viral infection . 2. Mild cardiomegaly. Electronically signed by: Rogelia Myers MD 04/25/2024 08:53 AM EST RP Workstation: [id number]     Procedures   Medications Ordered in the ED  cefTRIAXone (ROCEPHIN) 2 g in sodium chloride  0.9 % 100 mL IVPB (2 g Intravenous New Bag/Given 04/25/24 1250)  sodium chloride  0.9 % bolus 500 mL (0 mLs Intravenous Stopped 04/25/24 1148)  acetaminophen  (TYLENOL ) tablet 650 mg (650 mg Oral Given 04/25/24 0840)    Clinical Course as of 04/25/24 1254  Wed Apr 25, 2024  1251 Nitrite(!): POSITIVE [JS]  [address]): SMALL [JS]  1251 WBC, UA: 6-10 [JS]    Clinical Course User Index [JS] Jaaziah Schulke, PA-C                                 Medical Decision Making Amount and/or Complexity of Data  Reviewed Labs: ordered. Decision-making details documented in ED Course. Radiology: ordered.  Risk OTC drugs.   This patient presents to the ED for concern of urinary incontinence, this involves a number of treatment options, and is a complaint that carries with it a high risk of complications and morbidity.  The differential diagnosis includes infection, obstruction versus CVA.    Co morbidities: Discussed in HPI   Brief History:  See HPI.   EMR reviewed including pt PMHx, past surgical history and past visits to ER.   See HPI for more details   Lab Tests:  I ordered and independently interpreted labs.  The pertinent results include:    CBC with a slight leukocytosis 10.9, hemoglobin is within normal limits.  CMP remarkable for slight elevation in his creatinine within his baseline.  LFTs are within normal limits.  No electrolyte derangement.  I think acid is negative.  UA with small amount of hemoglobin, nitrites, small leukocytes, many bacteria and 6-10 white blood cell count.  Will send urine for culture.  Imaging Studies:  CT head without any acute finding. Chest x-ray is within normal limits.  Cardiac Monitoring:  The patient was maintained on a cardiac monitor.  I personally viewed and interpreted the cardiac monitored which showed an underlying rhythm of: Sinus tachycardia 121 EKG non-ischemic   Medicines ordered:  I ordered medication including Tylenol  for pyrexia Reevaluation of the patient after these medicines showed that the patient improved I have reviewed the patients home medicines and have made adjustments as needed  Critical Interventions:   Given Rocephin 2 g to cover him for urinary tract infection.  Reevaluation:  After the interventions noted above I re-evaluated patient and found that they have :improved  Social Determinants of Health:        Patient is currently un housed.   Problem List / ED Course:  Patient presents to the ED with  a chief complaint of urinary incontinence, currently states outside of a church as he is unhoused.  He has had 2 episodes of incontinence while in the emergency department.  Rectal temperature was obtained and he was found to be febrile, Tylenol  was given to help with pyrexia.  On review of prior records I do see that patient has a significant history including a prior CABG, long with other comorbidities.  Is also anticoagulated although unsure of compliance. Blood work on today's visit showed a CBC with a leukocytosis of 10.9, hemoglobin is within normal limits.  CMP without any electrolyte derangement, creatinine level is within his baseline slightly elevated.  Lactic acid is negative.  UA with small amount of blood, positive nitrites, small leukocytes 6-10 white blood cell count along with many bacteria concern for infection in the setting of urinary incontinence. Respiratory panel is negative for influenza, COVID-19, RSV.  His chest x-ray did not show any signs of pneumonia. Patient did have a hard time answering questions to me, he appeared to be somewhat altered therefore CT head was obtained without any intracranial pathology. I am concerned for some delirium in the setting of an acute infection.  He does not show signs of urosepsis at this time, although he was febrile and tachycardic but this has improved after receiving medication.  Will give him 2 g of Rocephin to help treat urinary tract infection.  Will call hospitalist for further admission. Spoke to Dr. Celinda hospitalist service who will admit patient for further management of his urinary tract infection causing change in mental status.  Patient remains hemodynamically stable for admission.  Dispostion:  After consideration of the diagnostic results and the patients response to treatment, I feel that the patent would benefit from admission for further management of his urinary tract infection.   Portions of this note were generated with  Scientist, clinical (histocompatibility and immunogenetics). Dictation errors may occur despite best attempts at proofreading.   Final diagnoses:  Delirium  Acute cystitis without hematuria    ED Discharge Orders     None          Maureen Broad, PA-C 04/25/24 1254    Cardama, Raynell Moder, MD 04/26/24 [id number]

## 2024-04-25 NOTE — H&P (Signed)
 History and Physical    Patient: Jeremy [name] FMW:[id number] DOB: September 13, 1955 DOA: 04/25/2024 DOS: the patient was seen and examined on 04/25/2024 PCP: Sadie Manna, MD  Patient coming from: Home  Chief Complaint: Dizziness, weakness and urinary incontinence.  HPI: Jeremy [name] is a 68 y.o. male with medical history significant of treatment noncompliance, cannabis use, cocaine use, tobacco use, paroxysmal atrial fibrillation, stage III CKD, tobacco abuse, chronic systolic heart failure, CAD/CABG, NSTEMI, history of nonketotic hyperosmolar state, type 2 diabetes, diabetic foot infection, hyperlipidemia, hypertension, history of pulmonary embolism, class I obesity, history of ESBL UTI, homelessness who presented BIBEMS to the emergency department due to above complaints.  The patient did not elaborate much as he is confused.  He is able to answer simple questions.  Denies headache, chest, back or abdominal pain.  Lab work: Urinalysis showed positive nitrates, small hemoglobin, small leukocyte esterase, and many bacteria.  CBC showed a white count of 10.9, hemoglobin 13.9 g/dL and platelets 767.  PT and INR were normal.  Unremarkable lactic acid.  Negative coronavirus, influenza and RSV PCR test.  CMP showed a CO2 of 21 mmol/L with a normal anion gap and creatinine level 1.48 mg/dL, the rest of the CMP measurements were normal.  His creatinine level is close to his average baseline.  Imaging: 2 view chest radiograph with interstitial opacity through both lungs which may reflect bronchovascular crowding due to low lung volumes, interstitial edema or atypical/viral infection.  Mild cardiomegaly.  CT head without contrast with no acute intracranial normality.  There is severe chronic small vessel ischemic disease.  ED course: Initial vital signs were temperature 100.7 F, pulse 122, respiration 20, BP 160/89 mmHg O2 sat 98% on room air.  The patient received acetaminophen  650 mg p.o. x 1,  ceftriaxone 2 g IVPB and normal saline 500 mL IV bolus.  Review of Systems: As mentioned in the history of present illness. All other systems reviewed and are negative. Past Medical History:  Diagnosis Date   Atrial fibrillation, chronic (HCC)    CHF (congestive heart failure) (HCC)    Coronary artery disease    Diabetes mellitus without complication (HCC)    Hypertension    S/P CABG x 1    Past Surgical History:  Procedure Laterality Date   ANKLE ARTHROSCOPY W/ OPEN REPAIR Right    CORONARY ARTERY BYPASS GRAFT     PULMONARY THROMBECTOMY Bilateral 12/24/2020   Procedure: PULMONARY THROMBECTOMY;  Surgeon: Marea Selinda RAMAN, MD;  Location: [facility] INVASIVE CV LAB;  Service: Cardiovascular;  Laterality: Bilateral;   Social History:  reports that he has been smoking cigarettes. He has never used smokeless tobacco. He reports current alcohol  use of about 3.0 standard drinks of alcohol  per week. He reports that he does not currently use drugs after having used the following drugs: Marijuana.  No Known Allergies  Family History  Problem Relation Age of Onset   Heart attack Father     Prior to Admission medications   Medication Sig Start Date End Date Taking? Authorizing Provider  acetaminophen  (TYLENOL ) 500 MG tablet Take 1 tablet (500 mg total) by mouth every 6 (six) hours as needed. Patient not taking: Reported on 10/27/2023 10/23/23   Mannie Pac T, DO  albuterol  (VENTOLIN  HFA) [phone] Base) MCG/ACT inhaler Inhale 2 puffs into the lungs every 6 (six) hours as needed for wheezing or shortness of breath. 04/11/24   Kennyth Domino, FNP  amiodarone  (PACERONE ) 200 MG tablet Take 1 tablet (200 mg total) by  mouth daily. 04/11/24 05/11/24  Kennyth Domino, FNP  amoxicillin -clavulanate (AUGMENTIN) 875-125 MG tablet Take 1 tablet by mouth every 12 (twelve) hours. 03/21/24   Dreama Longs, MD  atorvastatin  (LIPITOR) 40 MG tablet Take 1 tablet (40 mg total) by mouth daily. 04/11/24   Kennyth Domino, FNP   carvedilol  (COREG ) 6.25 MG tablet Take 1 tablet (6.25 mg total) by mouth 2 (two) times daily. 04/11/24   Kennyth Domino, FNP  doxycycline  (VIBRAMYCIN ) 100 MG capsule Take 1 capsule (100 mg total) by mouth 2 (two) times daily. 03/21/24   Dreama Longs, MD  metFORMIN  (GLUCOPHAGE ) 1000 MG tablet Take 1 tablet (1,000 mg total) by mouth daily with breakfast. 04/11/24 05/11/24  Kennyth Domino, FNP  nitroGLYCERIN  (NITROSTAT ) 0.4 MG SL tablet Place 1 tablet (0.4 mg total) under the tongue every 5 (five) minutes as needed for chest pain up to 3 doses. 12/27/23   Raenelle Coria, MD  rivaroxaban  (XARELTO ) 20 MG TABS tablet Take 1 tablet (20 mg total) by mouth daily at 5 pm 04/11/24 05/11/24  Kennyth Domino, FNP  sacubitril -valsartan  (ENTRESTO ) 24-26 MG Take 1 tablet by mouth 2 (two) times daily. 04/11/24   Kennyth Domino, FNP  sertraline  (ZOLOFT ) 50 MG tablet Take 1 tablet (50 mg total) by mouth daily. 04/11/24   Kennyth Domino, FNP  torsemide  (DEMADEX ) 20 MG tablet Take 1 tablet (20 mg total) by mouth daily. 04/11/24   Kennyth Domino, FNP  traMADol  (ULTRAM ) 50 MG tablet Take 1 tablet (50 mg total) by mouth every 6 (six) hours as needed. 02/29/24   Brien Belvie BRAVO, MD    Physical Exam: Vitals:   04/25/24 0815 04/25/24 1145 04/25/24 1151 04/25/24 1225  BP: (!) 152/100  134/78 (!) 158/91  Pulse: (!) 106 90  (!) 120  Resp: 20   17  Temp:   99.4 F (37.4 C) (!) 97.5 F (36.4 C)  TempSrc:      SpO2: 96% 97%  100%  Weight:      Height:       Physical Exam Constitutional:      General: He is awake. He is not in acute distress.    Appearance: He is obese. He is ill-appearing.  HENT:     Head: Normocephalic.     Nose: No rhinorrhea.     Mouth/Throat:     Mouth: Mucous membranes are dry.  Eyes:     General: No scleral icterus.    Pupils: Pupils are equal, round, and reactive to light.  Neck:     Vascular: No JVD.  Cardiovascular:     Rate and Rhythm: Normal rate and regular rhythm.     Heart  sounds: S1 normal and S2 normal.  Pulmonary:     Breath sounds: No wheezing, rhonchi or rales.  Abdominal:     General: Bowel sounds are normal. There is no distension.     Palpations: Abdomen is soft.     Tenderness: There is abdominal tenderness. There is no right CVA tenderness, left CVA tenderness or guarding.  Musculoskeletal:     Cervical back: Neck supple.     Right lower leg: No edema.     Left lower leg: No edema.  Skin:    General: Skin is warm and dry.  Neurological:     Mental Status: He is alert. He is disoriented.  Psychiatric:        Behavior: Behavior is cooperative.     Data Reviewed:  Results are pending, will review when available.  06/27/2023  transthoracic echocardiogram report. IMPRESSIONS:   1. Technically difficult study inspite of using Definity .   2. Left ventricular ejection fraction, by estimation, is 35 to 40%. The  left ventricle has moderately decreased function. The left ventricle  demonstrates global hypokinesis. There is moderate concentric left  ventricular hypertrophy. Left ventricular  diastolic parameters are indeterminate.   3. Right ventricle incompletely visualized. Grossly appears normal in  size with mild to moderately reduced systolic function.   4. The mitral valve is normal in structure. Trivial mitral valve  regurgitation.   5. The aortic valve is tricuspid. There is mild calcification of the  aortic valve. There is mild thickening of the aortic valve. Aortic valve  regurgitation is mild to moderate.   6. The inferior vena cava is normal in size with greater than 50%  respiratory variability, suggesting right atrial pressure of 3 mmHg.   EKG: Vent. rate 121 BPM PR interval 138 ms QRS duration 101 ms QT/QTcB 340/483 ms P-R-T axes 169 24 216 Sinus or ectopic atrial tachycardia Abnormal T, consider ischemia, diffuse leads ST elevation, consider inferior injury  Assessment and Plan: Principal Problem:   Sepsis secondary  to UTI [facility]) Has history of ESBL. Admit to telemetry/inpatient. Received small bolus in the ED. Time-limited/gentle IV fluid Begin meropenem  and 1 g IVPB every 8 hours.. Follow-up urine culture and sensitivity. Follow-up blood culture and sensitivity Follow CBC and CMP in a.m.  Active Problems:   Essential hypertension   Chronic systolic heart failure (HCC) Continue carvedilol  6.25 mg p.o. twice daily. Continue Entresto  24-26 mg p.o. twice daily.    Paroxysmal atrial fibrillation (HCC) CHA?DS?-VASc Score of at least 5. Continue carvedilol  and rivaroxaban .    Coronary artery disease Continue beta-blocker and statin. On DOAC's old no antiplatelet therapy.    COPD (chronic obstructive pulmonary disease) (HCC) Bronchodilators as needed.    Diabetes mellitus, type 2 (HCC) Carbohydrate modified diet. Continue metformin  1000 mg p.o. twice daily CBG monitoring with RI SS. Check hemoglobin A1c.    Hyperlipidemia Associated with: Aortic atherosclerosis Continue atorvastatin  40 mg p.o. daily.    Adjustment disorder with depressed mood Continue sertraline  50 mg p.o. daily.    GERD without esophagitis Antiacid, H2 blocker or PPI as needed.     Advance Care Planning:   Code Status: Full Code   Consults:   Family Communication:   Severity of Illness: The appropriate patient status for this patient is INPATIENT. Inpatient status is judged to be reasonable and necessary in order to provide the required intensity of service to ensure the patient's safety. The patient's presenting symptoms, physical exam findings, and initial radiographic and laboratory data in the context of their chronic comorbidities is felt to place them at high risk for further clinical deterioration. Furthermore, it is not anticipated that the patient will be medically stable for discharge from the hospital within 2 midnights of admission.   * I certify that at the point of admission it is my clinical  judgment that the patient will require inpatient hospital care spanning beyond 2 midnights from the point of admission due to high intensity of service, high risk for further deterioration and high frequency of surveillance required.*  Author: Alm Dorn Castor, MD 04/25/2024 12:50 PM  For on call review [email or web address].   This document was prepared using Dragon voice recognition software and may contain some unintended transcription errors.

## 2024-04-25 NOTE — ED Notes (Signed)
 Pt educated on In and Out cath. Pt then proceeded to pee into bedside urinal.

## 2024-04-25 NOTE — ED Triage Notes (Signed)
 [place]IB EMS from church (homeless[place] c/o SOB. Urinary incontinence throughout the night and was weaker than normal yesterday. Was dizzy this morning.   EMS vitals  BP 156/100 HR 122 SPO2 97 RA  CBG 118

## 2024-04-25 NOTE — ED Notes (Signed)
 Upon insertion of USIV by Rexene BIRCH RN, pt screamed, thrashed, and pulled back despite Lexi R RN holding pt hand. Unable to obtain IV access

## 2024-04-26 ENCOUNTER — Other Ambulatory Visit: Payer: Self-pay

## 2024-04-26 ENCOUNTER — Encounter (HOSPITAL_COMMUNITY): Payer: Self-pay

## 2024-04-26 DIAGNOSIS — N390 Urinary tract infection, site not specified: Secondary | ICD-10-CM | POA: Diagnosis not present

## 2024-04-26 DIAGNOSIS — A419 Sepsis, unspecified organism: Secondary | ICD-10-CM | POA: Diagnosis not present

## 2024-04-26 LAB — COMPREHENSIVE METABOLIC PANEL WITH GFR
ALT: 6 U/L (ref 0–44)
AST: 14 U/L — ABNORMAL LOW (ref 15–41)
Albumin: 3.6 g/dL (ref 3.5–5.0)
Alkaline Phosphatase: 60 U/L (ref 38–126)
Anion gap: 11 (ref 5–15)
BUN: 18 mg/dL (ref 8–23)
CO2: 17 mmol/L — ABNORMAL LOW (ref 22–32)
Calcium: 9.1 mg/dL (ref 8.9–10.3)
Chloride: 107 mmol/L (ref 98–111)
Creatinine, Ser: 1.49 mg/dL — ABNORMAL HIGH (ref 0.61–1.24)
GFR, Estimated: 51 mL/min — ABNORMAL LOW (ref >60)
Glucose, Bld: 99 mg/dL (ref 70–99)
Potassium: 3.9 mmol/L (ref 3.5–5.1)
Sodium: 136 mmol/L (ref 135–145)
Total Bilirubin: 0.9 mg/dL (ref 0.0–1.2)
Total Protein: 6.9 g/dL (ref 6.5–8.1)

## 2024-04-26 LAB — GLUCOSE, CAPILLARY
Glucose-Capillary: 200 mg/dL — ABNORMAL HIGH (ref 70–99)
Glucose-Capillary: 118 mg/dL — ABNORMAL HIGH (ref 70–99)
Glucose-Capillary: 174 mg/dL — ABNORMAL HIGH (ref 70–99)
Glucose-Capillary: 173 mg/dL — ABNORMAL HIGH (ref 70–99)

## 2024-04-26 LAB — CBC
HCT: 42.3 % (ref 39.0–52.0)
Hemoglobin: 14.2 g/dL (ref 13.0–17.0)
MCH: 31.5 pg (ref 26.0–34.0)
MCHC: 33.6 g/dL (ref 30.0–36.0)
MCV: 93.8 fL (ref 80.0–100.0)
Platelets: 221 K/uL (ref 150–400)
RBC: 4.51 MIL/uL (ref 4.22–5.81)
RDW: 14.2 % (ref 11.5–15.5)
WBC: 8.2 K/uL (ref 4.0–10.5)
nRBC: 0 % (ref 0.0–0.2)

## 2024-04-26 LAB — HEMOGLOBIN A1C
Hgb A1c MFr Bld: 6.7 % — ABNORMAL HIGH (ref 4.8–5.6)
Mean Plasma Glucose: 145.59 mg/dL

## 2024-04-26 NOTE — Plan of Care (Signed)
   Problem: Education: Goal: Knowledge of General Education information will improve Description: Including pain rating scale, medication(s)/side effects and non-pharmacologic comfort measures Outcome: Progressing   Problem: Pain Managment: Goal: General experience of comfort will improve and/or be controlled Outcome: Progressing   Problem: Safety: Goal: Ability to remain free from injury will improve Outcome: Progressing

## 2024-04-26 NOTE — TOC Initial Note (Signed)
 Transition of Care [facility]) - Initial/Assessment Note    Patient Details  Name: Jeremy [name] MRN: [id number] Date of Birth: Jul 04, 1955  Transition of Care [facility]) CM/SW Contact:    Heather DELENA Saltness, LCSW Phone Number: 04/26/2024, 10:10 AM  Clinical Narrative:                 Pt admitted to hospital for dizziness, weakness, and urinary incontinence. Pt currently homeless in [place]. Pt has history of cocaine abuse. Pt is only alert and oriented x2, with confusion. SUD and housing resources added to AVS. TOC will continue to follow for needs.   Expected Discharge Plan: Homeless Shelter Barriers to Discharge: Continued Medical Work up, Homeless with medical needs   Patient Goals and CMS Choice Patient states their goals for this hospitalization and ongoing recovery are:: To return to homeless shelter        Expected Discharge Plan and Services In-house Referral: Clinical Social Work Discharge Planning Services: NA Post Acute Care Choice: NA Living arrangements for the past 2 months: Homeless                 DME Arranged: N/A DME Agency: NA       HH Arranged: NA HH Agency: NA        Prior Living Arrangements/Services Living arrangements for the past 2 months: Homeless Lives with:: Self Patient language and need for interpreter reviewed:: Yes Do you feel safe going back to the place where you live?: No   Pt currently homeless in [place]  Need for Family Participation in Patient Care: No (Comment) Care giver support system in place?: No (comment)   Criminal Activity/Legal Involvement Pertinent to Current Situation/Hospitalization: No - Comment as needed   Permission Sought/Granted   Permission granted to share information with : No  Emotional Assessment   Attitude/Demeanor/Rapport: Unable to Assess Affect (typically observed): Unable to Assess Orientation: : Oriented to Self, Oriented to Place Alcohol  / Substance Use: Illicit Drugs, Tobacco Use Psych  Involvement: No (comment)  Admission diagnosis:  Delirium [R41.0] Acute cystitis without hematuria [N30.00] Sepsis secondary to UTI (HCC) [A41.9, N39.0] Patient Active Problem List   Diagnosis Date Noted   Sepsis secondary to UTI (HCC) 04/25/2024   COPD (chronic obstructive pulmonary disease) (HCC) 04/25/2024   Chronic systolic heart failure (HCC) 02/23/2024   Atrial flutter with rapid ventricular response (HCC) 12/23/2023   UTI due to extended-spectrum beta lactamase (ESBL) producing Escherichia coli 11/02/2023   Charcot joint of right ankle 10/28/2023   UTI (urinary tract infection) 10/27/2023   Cystitis 10/26/2023   Acute metabolic encephalopathy 08/07/2023   Sepsis due to gram-negative UTI (HCC) 08/07/2023   Dyslipidemia 08/07/2023   Paroxysmal atrial fibrillation (HCC) 08/07/2023   GERD without esophagitis 08/07/2023   Coronary artery disease 08/07/2023   Type 2 diabetes mellitus with chronic kidney disease, with long-term current use of insulin  (HCC) 08/07/2023   Sepsis (HCC) 08/07/2023   Septic shock (HCC) 07/31/2023   COPD with acute exacerbation (HCC) 06/26/2023   Afib (HCC) 06/26/2023   Adjustment disorder with depressed mood 03/14/2023   Acute ST elevation myocardial infarction (STEMI) of posterior wall (HCC) 11/24/2022   HFrEF (heart failure with reduced ejection fraction) (HCC) 11/24/2022   PAF (paroxysmal atrial fibrillation) (HCC) 11/24/2022   OSA (obstructive sleep apnea) 11/24/2022   Coronary artery disease involving native coronary artery of native heart 11/24/2022   Polysubstance abuse (HCC) 11/24/2022   Hyperlipidemia 11/24/2022   Aortic atherosclerosis 07/16/2021   Obesity (BMI 30-39.9) 07/16/2021  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/2021   Stage 3 chronic kidney disease (HCC) 07/16/2021   On mechanically assisted ventilation (HCC) 12/28/2020   Atrial fibrillation with RVR (HCC) 12/28/2020   Acute delirium 12/28/2020   Tobacco abuse 12/28/2020   Diabetes mellitus, type 2  (HCC) 12/28/2020   NSTEMI (non-ST elevated myocardial infarction) (HCC)    Acute respiratory failure with hypoxia (HCC)    Acute pulmonary embolism (HCC) 12/22/2020   History of cocaine use 11/26/2019   History of marijuana use 11/26/2019   History of illicit drug use 11/26/2019   Pharmacologic therapy 11/26/2019   Acute pain (now resolved) 11/26/2019   MRSA (methicillin resistant Staphylococcus aureus) infection 10/18/2019   Hyperosmolar non-ketotic state due to type 2 diabetes mellitus (HCC) 08/21/2019   Hyponatremia 08/19/2019   AKI (acute kidney injury) 08/19/2019   Essential hypertension 08/19/2019   Medically noncompliant 08/19/2019   Post-traumatic osteoarthritis of right ankle 10/23/2018   Skin ulcer of right ankle with fat layer exposed (HCC) 10/23/2018   Status post ORIF of fracture of ankle 10/23/2018   Diabetic foot infection (HCC) 10/22/2018   Pressure injury of skin 02/11/2018   PCP:  Sadie Manna, MD Pharmacy:   [facility] [address] [place] - [address] [place] [address] Phone: [phone] Fax: [phone]  [facility] [address] [place] [address] Phone: [phone] Fax: [phone]  [facility] - [facility] [address] [place] [address] Phone: [phone] Fax: [phone]  [facility] [address] [place] [address] Phone: [phone] Fax: [phone]  [facility] - [facility] [address] [place] [address] Phone: [phone] Fax: [phone]   Social Drivers of Health (SDOH) Social History: SDOH Screenings   Food Insecurity: Patient Unable To Answer (04/25/2024)  Housing: Patient Unable To Answer (04/25/2024)  Transportation Needs: Patient Unable To Answer (04/25/2024)  Utilities: Patient Unable To Answer (04/25/2024)   Depression (PHQ2-9): Low Risk  (10/30/2018)  Financial Resource Strain: Low Risk  (06/03/2023)   [facility]  Recent Concern: Financial Resource Strain - High Risk (03/22/2023)   [facility] from [facility]  Social Connections: Patient Unable To Answer (04/25/2024)  Tobacco Use: High Risk (04/10/2024)   SDOH Interventions: None     Readmission Risk Interventions    04/26/2024   10:08 AM 10/28/2023   10:12 AM 08/09/2023    2:42 PM  Readmission Risk Prevention Plan  Transportation Screening Complete Complete Complete  Medication Review Oceanographer) Complete Complete   PCP or Specialist appointment within 3-5 days of discharge Complete Complete   HRI or Home Care Consult Complete Complete --  SW Recovery Care/Counseling Consult Complete Complete   Palliative Care Screening Not Applicable Not Applicable Not Applicable  Skilled Nursing Facility Not Applicable Complete Not Applicable    Signed: Heather Saltness, MSW, LCSW Clinical Social Worker Inpatient Care Management 04/26/2024 10:13 AM

## 2024-04-26 NOTE — Progress Notes (Signed)
 Consultation Progress Note   Patient: Jeremy [name] FMW:[id number] DOB: 03-22-1956 DOA: 04/25/2024 DOS: the patient was seen and examined on 04/26/2024 Primary service: DibiaLandon BRAVO, MD  Brief hospital course: 68 y.o. male with medical history significant of treatment noncompliance, cannabis use, cocaine use, tobacco use, paroxysmal atrial fibrillation, stage III CKD, tobacco abuse, history of ESBL UTI, homelessness who presented BIBEMS to the emergency department on account of Dizziness, weakness and urinary incontinence.UA  was concerning for UTI and he met sepsis criteria . Patient was started on Merrem , Blood and urine cx obtained   Assessment and Plan: Sepsis secondary to UTI [facility]) Has history of ESBL. Admit to telemetry/inpatient. Received small bolus in the ED. Time-limited/gentle IV fluid Begin meropenem  and 1 g IVPB every 8 hours.. Follow-up urine culture and sensitivity. Follow-up blood culture and sensitivity Follow CBC and CMP in a.m.   Active Problems:   Essential hypertension   Chronic systolic heart failure (HCC) Continue carvedilol  6.25 mg p.o. twice daily. Continue Entresto  24-26 mg p.o. twice daily.     Paroxysmal atrial fibrillation (HCC) CHA?DS?-VASc Score of at least 5. Continue carvedilol  and rivaroxaban .     Coronary artery disease Continue beta-blocker and statin. On DOAC's old no antiplatelet therapy.     COPD (chronic obstructive pulmonary disease) (HCC) Bronchodilators as needed.     Diabetes mellitus, type 2 (HCC) Carbohydrate modified diet. Continue metformin  1000 mg p.o. twice daily CBG monitoring with RI SS. Check hemoglobin A1c.     Hyperlipidemia Associated with: Aortic atherosclerosis Continue atorvastatin  40 mg p.o. daily.     Adjustment disorder with depressed mood Continue sertraline  50 mg p.o. daily.     GERD without esophagitis Antiacid, H2 blocker or PPI as needed.           TRH will continue to follow the  patient.  Subjective: Complaining about his breakfast, states he doesn't like the food, oatmeal was runny. He denies fevers chills, nausea, vomiting, abd pain, diarrhea.  Physical Exam: Vitals:   04/25/24 1857 04/25/24 2058 04/26/24 0439 04/26/24 0945  BP: (!) 142/85 136/73 96/64 119/86  Pulse: (!) 122 (!) 122  99  Resp: 20 18 17 20   Temp:  98.4 F (36.9 C) 97.8 F (36.6 C)   TempSrc:  Oral Oral   SpO2: 97% 98% 96%   Weight:      Height:      General  No acute Distress Eyes: PERRL, lids and conjunctivae normal ENMT: Mucous membranes are moist.   Neck: normal, supple, no masses, no thyromegaly Respiratory: clear to auscultation bilaterally, no wheezing, no crackles. Normal respiratory effort. No accessory muscle use.  Cardiovascular: Regular rate and rhythm, no murmurs / rubs / gallops Abdomen: Soft, nontender nondistended. Musculoskeletal: no clubbing / cyanosis. No joint deformity upper and lower extremities.  Skin: no rashes, lesions, ulcers. No induration Neurologic: Facial asymmetry, moving extremity spontaneously, speech fluent. Psychiatric: Normal judgment and insight. Alert and oriented x 3. Normal mood.   Data Reviewed:    CBC    Component Value Date/Time   WBC 8.2 04/26/2024 0557   RBC 4.51 04/26/2024 0557   HGB 14.2 04/26/2024 0557   HGB 13.4 10/02/2011 0842   HCT 42.3 04/26/2024 0557   HCT 40.8 10/02/2011 0842   PLT 221 04/26/2024 0557   PLT 262 10/02/2011 0842   MCV 93.8 04/26/2024 0557   MCV 95 10/02/2011 0842   MCH 31.5 04/26/2024 0557   MCHC 33.6 04/26/2024 0557   RDW 14.2 04/26/2024 0557  RDW 14.4 10/02/2011 0842   LYMPHSABS 2.2 04/25/2024 0943   MONOABS 1.3 (H) 04/25/2024 0943   EOSABS 0.1 04/25/2024 0943   BASOSABS 0.1 04/25/2024 0943    CMP     Component Value Date/Time   NA 136 04/26/2024 0557   NA 146 (H) 10/03/2011 0533   K 3.9 04/26/2024 0557   K 3.8 10/03/2011 0533   CL 107 04/26/2024 0557   CL 111 (H) 10/03/2011 0533   CO2 17 (L)  04/26/2024 0557   CO2 24 10/03/2011 0533   GLUCOSE 99 04/26/2024 0557   GLUCOSE 107 (H) 10/03/2011 0533   BUN 18 04/26/2024 0557   BUN 8 10/03/2011 0533   CREATININE 1.49 (H) 04/26/2024 0557   CREATININE 1.11 10/03/2011 0533   CALCIUM  9.1 04/26/2024 0557   CALCIUM  8.7 10/03/2011 0533   PROT 6.9 04/26/2024 0557   PROT 7.1 10/02/2011 0842   ALBUMIN 3.6 04/26/2024 0557   ALBUMIN 3.6 10/02/2011 0842   AST 14 (L) 04/26/2024 0557   AST 14 (L) 10/02/2011 0842   ALT 6 04/26/2024 0557   ALT 18 10/02/2011 0842   ALKPHOS 60 04/26/2024 0557   ALKPHOS 89 10/02/2011 0842   BILITOT 0.9 04/26/2024 0557   BILITOT 0.3 10/02/2011 0842   GFRNONAA 51 (L) 04/26/2024 0557   GFRNONAA >60 10/03/2011 0533    I have reviewed pertinent nursing notes, vitals, labs, and images as necessary. I have ordered labwork to follow up on as indicated.  I have reviewed the last notes from staff over past 24 hours. I have discussed patient's care plan and test results with nursing staff, CM/SW, and other staff as appropriate.  Old records reviewed in assessment of this patient  Time spent: Greater than 50% of the 55 minute visit was spent in counseling/coordination of care for the patient as laid out in the A&P.  Time spent: 35 minutes.  Author: Landon FORBES Baller, MD 04/26/2024 9:51 AM  For on call review [email or web address].

## 2024-04-27 DIAGNOSIS — N390 Urinary tract infection, site not specified: Secondary | ICD-10-CM | POA: Diagnosis not present

## 2024-04-27 DIAGNOSIS — A419 Sepsis, unspecified organism: Secondary | ICD-10-CM | POA: Diagnosis not present

## 2024-04-27 LAB — BLOOD CULTURE ID PANEL (REFLEXED) - BCID2

## 2024-04-27 LAB — URINE CULTURE: Culture: >=100000 — AB

## 2024-04-27 LAB — GLUCOSE, CAPILLARY
Glucose-Capillary: 133 mg/dL — ABNORMAL HIGH (ref 70–99)
Glucose-Capillary: 119 mg/dL — ABNORMAL HIGH (ref 70–99)
Glucose-Capillary: 127 mg/dL — ABNORMAL HIGH (ref 70–99)
Glucose-Capillary: 140 mg/dL — ABNORMAL HIGH (ref 70–99)

## 2024-04-27 MED ORDER — ENSURE PLUS HIGH PROTEIN PO LIQD: 237.0000 mL | Freq: Two times a day (BID) | ORAL | 0 refills | Status: DC

## 2024-04-27 NOTE — Progress Notes (Signed)
 PHARMACY - PHYSICIAN COMMUNICATION CRITICAL VALUE ALERT - BLOOD CULTURE IDENTIFICATION (BCID)  Jeremy [name] is an 68 y.o. male who presented to [facility] on 04/25/2024 with a chief complaint of dizziness, weakness, urinary incontinence, hx ESBL Ecoli UTI  Assessment: ESBL Ecoli UTI and bacteremia  Name of physician (or Provider) Contacted: Swayze  Current antibiotics: meropenem  1g IV q8  Changes to prescribed antibiotics recommended:  Patient is on recommended antibiotics - No changes needed  Results for orders placed or performed during the hospital encounter of 07/31/23  Blood Culture ID Panel (Reflexed) (Collected: 07/31/2023 11:29 AM)  Result Value Ref Range   Enterococcus faecalis NOT DETECTED NOT DETECTED   Enterococcus Faecium NOT DETECTED NOT DETECTED   Listeria monocytogenes NOT DETECTED NOT DETECTED   Staphylococcus species NOT DETECTED NOT DETECTED   Staphylococcus aureus (BCID) NOT DETECTED NOT DETECTED   Staphylococcus epidermidis NOT DETECTED NOT DETECTED   Staphylococcus lugdunensis NOT DETECTED NOT DETECTED   Streptococcus species NOT DETECTED NOT DETECTED   Streptococcus agalactiae NOT DETECTED NOT DETECTED   Streptococcus pneumoniae NOT DETECTED NOT DETECTED   Streptococcus pyogenes NOT DETECTED NOT DETECTED   A.calcoaceticus-baumannii NOT DETECTED NOT DETECTED   Bacteroides fragilis NOT DETECTED NOT DETECTED   Enterobacterales DETECTED (A) NOT DETECTED   Enterobacter cloacae complex NOT DETECTED NOT DETECTED   Escherichia coli DETECTED (A) NOT DETECTED   Klebsiella aerogenes NOT DETECTED NOT DETECTED   Klebsiella oxytoca NOT DETECTED NOT DETECTED   Klebsiella pneumoniae NOT DETECTED NOT DETECTED   Proteus species NOT DETECTED NOT DETECTED   Salmonella species NOT DETECTED NOT DETECTED   Serratia marcescens NOT DETECTED NOT DETECTED   Haemophilus influenzae NOT DETECTED NOT DETECTED   Neisseria meningitidis NOT DETECTED NOT DETECTED   Pseudomonas  aeruginosa NOT DETECTED NOT DETECTED   Stenotrophomonas maltophilia NOT DETECTED NOT DETECTED   Candida albicans NOT DETECTED NOT DETECTED   Candida auris NOT DETECTED NOT DETECTED   Candida glabrata NOT DETECTED NOT DETECTED   Candida krusei NOT DETECTED NOT DETECTED   Candida parapsilosis NOT DETECTED NOT DETECTED   Candida tropicalis NOT DETECTED NOT DETECTED   Cryptococcus neoformans/gattii NOT DETECTED NOT DETECTED   CTX-M ESBL DETECTED (A) NOT DETECTED   Carbapenem resistance IMP NOT DETECTED NOT DETECTED   Carbapenem resistance KPC NOT DETECTED NOT DETECTED   Carbapenem resistance NDM NOT DETECTED NOT DETECTED   Carbapenem resist OXA 48 LIKE NOT DETECTED NOT DETECTED   Carbapenem resistance VIM NOT DETECTED NOT DETECTED    Zeki, Bedrosian 04/27/2024  3:28 PM

## 2024-04-27 NOTE — Plan of Care (Signed)

## 2024-04-27 NOTE — TOC Transition Note (Signed)
 Transition of Care [facility]) - Discharge Note   Patient Details  Name: Jeremy [name] MRN: [id number] Date of Birth: 15-Sep-1955  Transition of Care [facility]) CM/SW Contact:  Sonda Manuella Quill, RN Phone Number: 04/27/2024, 1:40 PM   Clinical Narrative:    IP CM for d/c planning; spoke w/ pt in room; pt said he was homeless but he will [facility]; he requested bus pass for transportation; pt identified POV Sergio Hahn (nephew) [phone]; pt said he has food insecurity; he denied IPV; insurance/PCP verified; pt has cane; he does not have HH services or home oxygen; resources for social services, transportation, financial assistance, and shelters placed in d/c instructions; pt will make his own appt at agencies of choice; bus pass left w/ [place], [place] [facility]; no IP CM needs.   Final next level of care: Home/Self Care Barriers to Discharge: No Barriers Identified   Patient Goals and CMS Choice Patient states their goals for this hospitalization and ongoing recovery are:: To return to homeless shelter          Discharge Placement                       Discharge Plan and Services Additional resources added to the After Visit Summary for   In-house Referral: Clinical Social Work Discharge Planning Services: CM Consult Post Acute Care Choice: NA          DME Arranged: N/A DME Agency: NA       HH Arranged: NA HH Agency: NA        Social Drivers of Health (SDOH) Interventions SDOH Screenings   Food Insecurity: Food Insecurity Present (04/27/2024)  Housing: High Risk (04/27/2024)  Transportation Needs: Unmet Transportation Needs (04/27/2024)  Utilities: Not At Risk (04/27/2024)  Depression (PHQ2-9): Low Risk  (10/30/2018)  Financial Resource Strain: Low Risk  (06/03/2023)   Received from [facility]  Recent Concern: Financial Resource Strain - High Risk (03/22/2023)   Received from [facility]  Social Connections: Patient  Unable To Answer (04/25/2024)  Tobacco Use: High Risk (04/26/2024)     Readmission Risk Interventions    04/27/2024    1:37 PM 04/26/2024   10:08 AM 10/28/2023   10:12 AM  Readmission Risk Prevention Plan  Transportation Screening Complete Complete Complete  Medication Review Oceanographer) Complete Complete Complete  PCP or Specialist appointment within 3-5 days of discharge Complete Complete Complete  HRI or Home Care Consult Complete Complete Complete  SW Recovery Care/Counseling Consult Complete Complete Complete  Palliative Care Screening Not Applicable Not Applicable Not Applicable  Skilled Nursing Facility Not Applicable Not Applicable Complete

## 2024-04-27 NOTE — Plan of Care (Signed)
  Problem: Education: Goal: Knowledge of General Education information will improve Description: Including pain rating scale, medication(s)/side effects and non-pharmacologic comfort measures Outcome: Progressing   Problem: Health Behavior/Discharge Planning: Goal: Ability to manage health-related needs will improve Outcome: Progressing   Problem: Clinical Measurements: Goal: Ability to maintain clinical measurements within normal limits will improve Outcome: Progressing Goal: Will remain free from infection Outcome: Progressing Goal: Diagnostic test results will improve Outcome: Progressing   Problem: Activity: Goal: Risk for activity intolerance will decrease Outcome: Progressing   Problem: Metabolic: Goal: Ability to maintain appropriate glucose levels will improve Outcome: Progressing   Problem: Nutritional: Goal: Maintenance of adequate nutrition will improve Outcome: Progressing

## 2024-04-27 NOTE — Progress Notes (Signed)
 Patient informed this nurse that he felt too weak to go leave the hospital when this nurse came in room to go over his AVS and discharge instructions.  MD notified.  See new orders.  DC canceled.

## 2024-04-27 NOTE — Progress Notes (Signed)
 Consultation Progress Note   Patient: Jeremy [name] FMW:[id number] DOB: 04-13-1956 DOA: 04/25/2024 DOS: the patient was seen and examined on 04/27/2024 Primary service: Soledad Ligas, DO  Brief hospital course: 68 y.o. male with medical history significant of treatment noncompliance, cannabis use, cocaine use, tobacco use, paroxysmal atrial fibrillation, stage III CKD, tobacco abuse, history of ESBL UTI, homelessness who presented BIBEMS to the emergency department on account of Dizziness, weakness and urinary incontinence.UA  was concerning for UTI and he met sepsis criteria . Patient was started on Merrem , Blood and urine cx obtained.     Assessment and Plan: Sepsis secondary to UTI [facility]) Has history of ESBL. Admit to telemetry/inpatient. Received small bolus in the ED. Time-limited/gentle IV fluid Begin meropenem  and 1 g IVPB every 8 hours.. Urine culture has grown out ESBL E. Coli. 1/2 blood culture has likewise grown out ESBL E. Coli. Consider transition to oral antibiotics tomorrow.    Active Problems:   Essential hypertension   Chronic systolic heart failure (HCC) Continue carvedilol  6.25 mg p.o. twice daily. Continue Entresto  24-26 mg p.o. twice daily.     Paroxysmal atrial fibrillation (HCC) CHA?DS?-VASc Score of at least 5. Continue carvedilol  and rivaroxaban .     Coronary artery disease Continue beta-blocker and statin. On DOAC's old no antiplatelet therapy.     COPD (chronic obstructive pulmonary disease) (HCC) Bronchodilators as needed.     Diabetes mellitus, type 2 (HCC) Carbohydrate modified diet. Continue metformin  1000 mg p.o. twice daily CBG monitoring with RI SS. Check hemoglobin A1c.     Hyperlipidemia Associated with: Aortic atherosclerosis Continue atorvastatin  40 mg p.o. daily.     Adjustment disorder with depressed mood Continue sertraline  50 mg p.o. daily.     GERD without esophagitis Antiacid, H2 blocker or PPI as needed.  TRH will  continue to follow the patient.  Subjective: No new complaints.  Physical Exam: Vitals:   04/26/24 2036 04/27/24 0525 04/27/24 1339 04/27/24 1400  BP: 113/68 100/81 (!) 105/58   Pulse: (!) 110 80 (!) 50   Resp: 20 20 20    Temp: 98.3 F (36.8 C) 98.3 F (36.8 C) (!) 97.4 F (36.3 C)   TempSrc: Oral     SpO2: 98% 97% (!) 66% 95%  Weight:      Height:      General  No acute Distress Eyes: PERRL, lids and conjunctivae normal ENMT: Mucous membranes are moist.   Neck: normal, supple, no masses, no thyromegaly Respiratory: clear to auscultation bilaterally, no wheezing, no crackles. Normal respiratory effort. No accessory muscle use.  Cardiovascular: Regular rate and rhythm, no murmurs / rubs / gallops Abdomen: Soft, nontender nondistended. Musculoskeletal: no clubbing / cyanosis. No joint deformity upper and lower extremities.  Skin: no rashes, lesions, ulcers. No induration Neurologic: Facial asymmetry, moving extremity spontaneously, speech fluent. Psychiatric: Normal judgment and insight. Alert and oriented x 3. Normal mood.   Data Reviewed:    CBC    Component Value Date/Time   WBC 8.2 04/26/2024 0557   RBC 4.51 04/26/2024 0557   HGB 14.2 04/26/2024 0557   HGB 13.4 10/02/2011 0842   HCT 42.3 04/26/2024 0557   HCT 40.8 10/02/2011 0842   PLT 221 04/26/2024 0557   PLT 262 10/02/2011 0842   MCV 93.8 04/26/2024 0557   MCV 95 10/02/2011 0842   MCH 31.5 04/26/2024 0557   MCHC 33.6 04/26/2024 0557   RDW 14.2 04/26/2024 0557   RDW 14.4 10/02/2011 0842   LYMPHSABS 2.2 04/25/2024 0943  MONOABS 1.3 (H) 04/25/2024 0943   EOSABS 0.1 04/25/2024 0943   BASOSABS 0.1 04/25/2024 0943    CMP     Component Value Date/Time   NA 136 04/26/2024 0557   NA 146 (H) 10/03/2011 0533   K 3.9 04/26/2024 0557   K 3.8 10/03/2011 0533   CL 107 04/26/2024 0557   CL 111 (H) 10/03/2011 0533   CO2 17 (L) 04/26/2024 0557   CO2 24 10/03/2011 0533   GLUCOSE 99 04/26/2024 0557   GLUCOSE 107  (H) 10/03/2011 0533   BUN 18 04/26/2024 0557   BUN 8 10/03/2011 0533   CREATININE 1.49 (H) 04/26/2024 0557   CREATININE 1.11 10/03/2011 0533   CALCIUM  9.1 04/26/2024 0557   CALCIUM  8.7 10/03/2011 0533   PROT 6.9 04/26/2024 0557   PROT 7.1 10/02/2011 0842   ALBUMIN 3.6 04/26/2024 0557   ALBUMIN 3.6 10/02/2011 0842   AST 14 (L) 04/26/2024 0557   AST 14 (L) 10/02/2011 0842   ALT 6 04/26/2024 0557   ALT 18 10/02/2011 0842   ALKPHOS 60 04/26/2024 0557   ALKPHOS 89 10/02/2011 0842   BILITOT 0.9 04/26/2024 0557   BILITOT 0.3 10/02/2011 0842   GFRNONAA 51 (L) 04/26/2024 0557   GFRNONAA >60 10/03/2011 0533    I have reviewed pertinent nursing notes, vitals, labs, and images as necessary. I have ordered labwork to follow up on as indicated.  I have reviewed the last notes from staff over past 24 hours. I have discussed patient's care plan and test results with nursing staff, CM/SW, and other staff as appropriate.  Old records reviewed in assessment of this patient  Time spent: Greater than 50% of the 55 minute visit was spent in counseling/coordination of care for the patient as laid out in the A&P.  Time spent: 35 minutes.  Author: Hilma Steinhilber, DO 04/27/2024 5:46 PM  For on call review [email or web address].

## 2024-04-28 DIAGNOSIS — N390 Urinary tract infection, site not specified: Secondary | ICD-10-CM | POA: Diagnosis not present

## 2024-04-28 DIAGNOSIS — A419 Sepsis, unspecified organism: Secondary | ICD-10-CM | POA: Diagnosis not present

## 2024-04-28 LAB — GLUCOSE, CAPILLARY
Glucose-Capillary: 128 mg/dL — ABNORMAL HIGH (ref 70–99)
Glucose-Capillary: 129 mg/dL — ABNORMAL HIGH (ref 70–99)
Glucose-Capillary: 114 mg/dL — ABNORMAL HIGH (ref 70–99)
Glucose-Capillary: 144 mg/dL — ABNORMAL HIGH (ref 70–99)

## 2024-04-28 NOTE — Progress Notes (Signed)
 OT Cancellation Note  Patient Details Name: Jeremy [name] MRN: [id number] DOB: December 16, 1955   Cancelled Treatment:    Reason Eval/Treat Not Completed: OT screened, no needs identified, will sign off Per PT, pt is functioning at baseline level and no needs identified at this time. OT will sign off. Thank you for referral.   [name] 04/28/2024, 11:33 AM

## 2024-04-28 NOTE — Plan of Care (Signed)
  Problem: Education: Goal: Knowledge of General Education information will improve Description: Including pain rating scale, medication(s)/side effects and non-pharmacologic comfort measures Outcome: Progressing   Problem: Health Behavior/Discharge Planning: Goal: Ability to manage health-related needs will improve Outcome: Progressing   Problem: Clinical Measurements: Goal: Ability to maintain clinical measurements within normal limits will improve Outcome: Progressing Goal: Will remain free from infection Outcome: Progressing Goal: Diagnostic test results will improve Outcome: Progressing   Problem: Activity: Goal: Risk for activity intolerance will decrease Outcome: Progressing   Problem: Education: Goal: Ability to describe self-care measures that may prevent or decrease complications (Diabetes Survival Skills Education) will improve Outcome: Progressing   Problem: Metabolic: Goal: Ability to maintain appropriate glucose levels will improve Outcome: Progressing

## 2024-04-28 NOTE — Progress Notes (Signed)
 Consultation Progress Note   Patient: Jeremy [name] FMW:[id number] DOB: [birth year] DOA: 04/25/2024 DOS: the patient was seen and examined on 04/28/2024 Primary service: Soledad Ligas, DO  Brief hospital course: 68 y.o. male with medical history significant of treatment noncompliance, cannabis use, cocaine use, tobacco use, paroxysmal atrial fibrillation, stage III CKD, tobacco abuse, history of ESBL UTI, homelessness who presented BIBEMS to the emergency department on account of Dizziness, weakness and urinary incontinence.UA  was concerning for UTI and he met sepsis criteria . Patient was started on Merrem , Blood and urine cx obtained.   The 1/2 blood cultures have grown out ESBL E coli as has the urine. He is receiving Merrem  to which the organism is sensitive.  Will recheck blood cultures tomorrow.  Assessment and Plan: Sepsis secondary to UTI [facility]) Has history of ESBL. Admit to telemetry/inpatient. Received small bolus in the ED. Time-limited/gentle IV fluid Begin meropenem  and 1 g IVPB every 8 hours.. Urine culture has grown out ESBL E. Coli. 1/2 blood culture has likewise grown out ESBL E. Coli. Consider transition to oral antibiotics tomorrow.    Active Problems:   Essential hypertension   Chronic systolic heart failure (HCC) Continue carvedilol  6.25 mg p.o. twice daily. Continue Entresto  24-26 mg p.o. twice daily.     Paroxysmal atrial fibrillation (HCC) CHA?DS?-VASc Score of at least 5. Continue carvedilol  and rivaroxaban .     Coronary artery disease Continue beta-blocker and statin. On DOAC's old no antiplatelet therapy.     COPD (chronic obstructive pulmonary disease) (HCC) Bronchodilators as needed.     Diabetes mellitus, type 2 (HCC) Carbohydrate modified diet. Continue metformin  1000 mg p.o. twice daily CBG monitoring with RI SS. Check hemoglobin A1c.     Hyperlipidemia Associated with: Aortic atherosclerosis Continue atorvastatin  40 mg p.o. daily.      Adjustment disorder with depressed mood Continue sertraline  50 mg p.o. daily.     GERD without esophagitis Antiacid, H2 blocker or PPI as needed.  TRH will continue to follow the patient.  Subjective: No new complaints.  Physical Exam: Vitals:   04/27/24 1400 04/27/24 2122 04/28/24 0523 04/28/24 1232  BP:  110/73 100/76 95/62  Pulse:  98 96 77  Resp:  18 20 18   Temp:  97.8 F (36.6 C) (!) 97.4 F (36.3 C) 98 F (36.7 C)  TempSrc:  Oral Oral   SpO2: 95%     Weight:      Height:      General  No acute Distress Eyes: PERRL, lids and conjunctivae normal ENMT: Mucous membranes are moist.   Neck: normal, supple, no masses, no thyromegaly Respiratory: clear to auscultation bilaterally, no wheezing, no crackles. Normal respiratory effort. No accessory muscle use.  Cardiovascular: Regular rate and rhythm, no murmurs / rubs / gallops Abdomen: Soft, nontender nondistended. Musculoskeletal: no clubbing / cyanosis. No joint deformity upper and lower extremities.  Skin: no rashes, lesions, ulcers. No induration Neurologic: Facial asymmetry, moving extremity spontaneously, speech fluent. Psychiatric: Normal judgment and insight. Alert and oriented x 3. Normal mood.   Data Reviewed:    CBC    Component Value Date/Time   WBC 8.2 04/26/2024 0557   RBC 4.51 04/26/2024 0557   HGB 14.2 04/26/2024 0557   HGB 13.4 10/02/2011 0842   HCT 42.3 04/26/2024 0557   HCT 40.8 10/02/2011 0842   PLT 221 04/26/2024 0557   PLT 262 10/02/2011 0842   MCV 93.8 04/26/2024 0557   MCV 95 10/02/2011 0842   MCH 31.5 04/26/2024 0557  MCHC 33.6 04/26/2024 0557   RDW 14.2 04/26/2024 0557   RDW 14.4 10/02/2011 0842   LYMPHSABS 2.2 04/25/2024 0943   MONOABS 1.3 (H) 04/25/2024 0943   EOSABS 0.1 04/25/2024 0943   BASOSABS 0.1 04/25/2024 0943    CMP     Component Value Date/Time   NA 136 04/26/2024 0557   NA 146 (H) 10/03/2011 0533   K 3.9 04/26/2024 0557   K 3.8 10/03/2011 0533   CL 107 04/26/2024  0557   CL 111 (H) 10/03/2011 0533   CO2 17 (L) 04/26/2024 0557   CO2 24 10/03/2011 0533   GLUCOSE 99 04/26/2024 0557   GLUCOSE 107 (H) 10/03/2011 0533   BUN 18 04/26/2024 0557   BUN 8 10/03/2011 0533   CREATININE 1.49 (H) 04/26/2024 0557   CREATININE 1.11 10/03/2011 0533   CALCIUM  9.1 04/26/2024 0557   CALCIUM  8.7 10/03/2011 0533   PROT 6.9 04/26/2024 0557   PROT 7.1 10/02/2011 0842   ALBUMIN 3.6 04/26/2024 0557   ALBUMIN 3.6 10/02/2011 0842   AST 14 (L) 04/26/2024 0557   AST 14 (L) 10/02/2011 0842   ALT 6 04/26/2024 0557   ALT 18 10/02/2011 0842   ALKPHOS 60 04/26/2024 0557   ALKPHOS 89 10/02/2011 0842   BILITOT 0.9 04/26/2024 0557   BILITOT 0.3 10/02/2011 0842   GFRNONAA 51 (L) 04/26/2024 0557   GFRNONAA >60 10/03/2011 0533    I have reviewed pertinent nursing notes, vitals, labs, and images as necessary. I have ordered labwork to follow up on as indicated.  I have reviewed the last notes from staff over past 24 hours. I have discussed patient's care plan and test results with nursing staff, CM/SW, and other staff as appropriate.  Old records reviewed in assessment of this patient  Time spent: Greater than 50% of the 55 minute visit was spent in counseling/coordination of care for the patient as laid out in the A&P.  Time spent: 30 minutes.  Author: Derrin Currey, DO 04/28/2024 4:37 PM  For on call review [email or web address].

## 2024-04-28 NOTE — Plan of Care (Signed)
  Problem: Activity: Goal: Risk for activity intolerance will decrease Outcome: Progressing   Problem: Coping: Goal: Level of anxiety will decrease Outcome: Progressing   Problem: Pain Managment: Goal: General experience of comfort will improve and/or be controlled Outcome: Progressing   Problem: Safety: Goal: Ability to remain free from injury will improve Outcome: Progressing   Problem: Skin Integrity: Goal: Risk for impaired skin integrity will decrease Outcome: Progressing   Problem: Coping: Goal: Ability to adjust to condition or change in health will improve Outcome: Progressing   Problem: Skin Integrity: Goal: Risk for impaired skin integrity will decrease Outcome: Progressing

## 2024-04-28 NOTE — Evaluation (Signed)
 Physical Therapy Evaluation Patient Details Name: Jeremy [name] MRN: [id number] DOB: 01-17-1956 Today's Date: 04/28/2024  History of Present Illness  68 y.o. male who presented BIBEMS to the emergency department on account of Dizziness, weakness and urinary incontinence.UA was concerning for UTI and he met sepsis criteriawith medical history significant of treatment noncompliance, cannabis use, cocaine use, tobacco use, paroxysmal atrial fibrillation, stage III CKD, tobacco abuse, history of ESBL UTI, homelessness  Clinical Impression  Pt admitted with above diagnosis. Pt ambulated 150' with SPC, no loss of balance. He is mobilizing at baseline level of modified independence. He is ready to DC from a PT standpoint.  PT signing off, mobility team to follow.         If plan is discharge home, recommend the following:     Can travel by private vehicle        Equipment Recommendations None recommended by PT  Recommendations for Other Services       Functional Status Assessment Patient has not had a recent decline in their functional status     Precautions / Restrictions Precautions Precautions: None Recall of Precautions/Restrictions: Intact Restrictions Weight Bearing Restrictions Per Provider Order: No      Mobility  Bed Mobility Overal bed mobility: Independent                  Transfers Overall transfer level: Modified independent Equipment used: Straight cane                    Ambulation/Gait Ambulation/Gait assistance: Modified independent (Device/Increase time) Gait Distance (Feet): 150 Feet Assistive device: Rolling walker (2 wheels) Gait Pattern/deviations: WFL(Within Functional Limits) Gait velocity: WFL     General Gait Details: steady, no loss of balance  Stairs            Wheelchair Mobility     Tilt Bed    Modified Rankin (Stroke Patients Only)       Balance Overall balance assessment: Modified Independent                                            Pertinent Vitals/Pain Pain Assessment Pain Assessment: No/denies pain    Home Living Family/patient expects to be discharged to:: Shelter/Homeless                        Prior Function Prior Level of Function : Independent/Modified Independent             Mobility Comments: walks with SPC, no falls in past 6 months ADLs Comments: pt reports ind     Extremity/Trunk Assessment   Upper Extremity Assessment Upper Extremity Assessment: Overall WFL for tasks assessed    Lower Extremity Assessment Lower Extremity Assessment: Overall WFL for tasks assessed    Cervical / Trunk Assessment Cervical / Trunk Assessment: Normal  Communication   Communication Communication: No apparent difficulties    Cognition Arousal: Alert Behavior During Therapy: WFL for tasks assessed/performed   PT - Cognitive impairments: No apparent impairments                         Following commands: Intact       Cueing       General Comments      Exercises General Exercises - Lower Extremity Long Arc Quad: AROM, Both, 5 reps, Seated  Hip Flexion/Marching: AROM, Both, 5 reps, Seated   Assessment/Plan    PT Assessment Patient does not need any further PT services  PT Problem List         PT Treatment Interventions      PT Goals (Current goals can be found in the Care Plan section)  Acute Rehab PT Goals PT Goal Formulation: All assessment and education complete, DC therapy    Frequency       Co-evaluation               AM-PAC PT 6 Clicks Mobility  Outcome Measure Help needed turning from your back to your side while in a flat bed without using bedrails?: None Help needed moving from lying on your back to sitting on the side of a flat bed without using bedrails?: None Help needed moving to and from a bed to a chair (including a wheelchair)?: None Help needed standing up from a chair using your arms  (e.g., wheelchair or bedside chair)?: None Help needed to walk in hospital room?: None Help needed climbing 3-5 steps with a railing? : None 6 Click Score: 24    End of Session Equipment Utilized During Treatment: Gait belt Activity Tolerance: Patient tolerated treatment well Patient left: in chair;with call bell/phone within reach;with chair alarm set Nurse Communication: Mobility status      Time: 8985-8967 PT Time Calculation (min) (ACUTE ONLY): 18 min   Charges:   PT Evaluation $PT Eval Low Complexity: 1 Low   PT General Charges $$ ACUTE PT VISIT: 1 Visit        Sylvan Delon Copp PT 04/28/2024  Acute Rehabilitation Services  Office [phone]

## 2024-04-29 DIAGNOSIS — A419 Sepsis, unspecified organism: Secondary | ICD-10-CM | POA: Diagnosis not present

## 2024-04-29 DIAGNOSIS — N390 Urinary tract infection, site not specified: Secondary | ICD-10-CM | POA: Diagnosis not present

## 2024-04-29 LAB — CBC WITH DIFFERENTIAL/PLATELET
Abs Immature Granulocytes: 0.01 K/uL (ref 0.00–0.07)
Basophils Absolute: 0.1 K/uL (ref 0.0–0.1)
Basophils Relative: 1 %
Eosinophils Absolute: 0.2 K/uL (ref 0.0–0.5)
Eosinophils Relative: 4 %
HCT: 45.4 % (ref 39.0–52.0)
Hemoglobin: 15 g/dL (ref 13.0–17.0)
Immature Granulocytes: 0 %
Lymphocytes Relative: 37 %
Lymphs Abs: 1.7 K/uL (ref 0.7–4.0)
MCH: 31.6 pg (ref 26.0–34.0)
MCHC: 33 g/dL (ref 30.0–36.0)
MCV: 95.8 fL (ref 80.0–100.0)
Monocytes Absolute: 1 K/uL (ref 0.1–1.0)
Monocytes Relative: 20 %
Neutro Abs: 1.7 K/uL (ref 1.7–7.7)
Neutrophils Relative %: 38 %
Platelets: 252 K/uL (ref 150–400)
RBC: 4.74 MIL/uL (ref 4.22–5.81)
RDW: 13.4 % (ref 11.5–15.5)
WBC: 4.7 K/uL (ref 4.0–10.5)
nRBC: 0 % (ref 0.0–0.2)

## 2024-04-29 LAB — CULTURE, BLOOD (ROUTINE X 2): Special Requests: ADEQUATE

## 2024-04-29 LAB — GLUCOSE, CAPILLARY
Glucose-Capillary: 106 mg/dL — ABNORMAL HIGH (ref 70–99)
Glucose-Capillary: 151 mg/dL — ABNORMAL HIGH (ref 70–99)
Glucose-Capillary: 107 mg/dL — ABNORMAL HIGH (ref 70–99)
Glucose-Capillary: 108 mg/dL — ABNORMAL HIGH (ref 70–99)

## 2024-04-29 LAB — BASIC METABOLIC PANEL WITH GFR
Anion gap: 12 (ref 5–15)
BUN: 23 mg/dL (ref 8–23)
CO2: 21 mmol/L — ABNORMAL LOW (ref 22–32)
Calcium: 9.1 mg/dL (ref 8.9–10.3)
Chloride: 104 mmol/L (ref 98–111)
Creatinine, Ser: 1.65 mg/dL — ABNORMAL HIGH (ref 0.61–1.24)
GFR, Estimated: 46 mL/min — ABNORMAL LOW (ref >60)
Glucose, Bld: 116 mg/dL — ABNORMAL HIGH (ref 70–99)
Potassium: 4.2 mmol/L (ref 3.5–5.1)
Sodium: 137 mmol/L (ref 135–145)

## 2024-04-29 LAB — TROPONIN T, HIGH SENSITIVITY
Troponin T High Sensitivity: 23 ng/L — ABNORMAL HIGH (ref 0–19)
Troponin T High Sensitivity: 27 ng/L — ABNORMAL HIGH (ref 0–19)

## 2024-04-29 NOTE — Plan of Care (Signed)
  Problem: Health Behavior/Discharge Planning: Goal: Ability to manage health-related needs will improve Outcome: Progressing   Problem: Safety: Goal: Ability to remain free from injury will improve Outcome: Progressing   Problem: Coping: Goal: Ability to adjust to condition or change in health will improve Outcome: Progressing   Problem: Nutritional: Goal: Maintenance of adequate nutrition will improve Outcome: Progressing

## 2024-04-29 NOTE — Plan of Care (Signed)
  Problem: Education: Goal: Knowledge of General Education information will improve Description: Including pain rating scale, medication(s)/side effects and non-pharmacologic comfort measures Outcome: Progressing   Problem: Health Behavior/Discharge Planning: Goal: Ability to manage health-related needs will improve Outcome: Progressing   Problem: Clinical Measurements: Goal: Ability to maintain clinical measurements within normal limits will improve Outcome: Progressing Goal: Will remain free from infection Outcome: Progressing Goal: Diagnostic test results will improve Outcome: Progressing   Problem: Activity: Goal: Risk for activity intolerance will decrease Outcome: Progressing   Problem: Metabolic: Goal: Ability to maintain appropriate glucose levels will improve Outcome: Progressing

## 2024-04-29 NOTE — Progress Notes (Addendum)
 Consultation Progress Note   Patient: Jeremy [name] FMW:[id number] DOB: 12/18/1955 DOA: 04/25/2024 DOS: the patient was seen and examined on 04/29/2024 Primary service: Soledad Ligas, DO  Brief hospital course: 68 y.o. male with medical history significant of treatment noncompliance, cannabis use, cocaine use, tobacco use, paroxysmal atrial fibrillation, stage III CKD, tobacco abuse, history of ESBL UTI, homelessness who presented BIBEMS to the emergency department on account of Dizziness, weakness and urinary incontinence.UA  was concerning for UTI and he met sepsis criteria . Patient was started on Merrem , Blood and urine cx obtained.   The 1/2 blood cultures have grown out ESBL E coli as has the urine. He is receiving Merrem  to which the organism is sensitive.  Repeat blood cultures have had no growth for 24 hours. Continue to monitor.  Assessment and Plan: Sepsis secondary to UTI [facility]) The patient met Sepsis criteria upon admission due to RR of 26, HR of 121, in the setting of an acute UTI. Has history of ESBL. Admit to telemetry/inpatient. Received small bolus in the ED. Time-limited/gentle IV fluid Begin meropenem  and 1 g IVPB every 8 hours.. Urine culture has grown out ESBL E. Coli. 1/2 blood culture has likewise grown out ESBL E. Coli. Consider transition to oral antibiotics tomorrow.    Active Problems:   Essential hypertension   Chronic systolic heart failure (HCC) Continue carvedilol  6.25 mg p.o. twice daily. Continue Entresto  24-26 mg p.o. twice daily.     Paroxysmal atrial fibrillation (HCC) CHA?DS?-VASc Score of at least 5. Continue carvedilol  and rivaroxaban .  CKD 3a Noted. Monitor.     Coronary artery disease Continue beta-blocker and statin. On DOAC's old no antiplatelet therapy.     COPD (chronic obstructive pulmonary disease) (HCC) Bronchodilators as needed.     Diabetes mellitus, type 2 (HCC) Carbohydrate modified diet. Continue metformin  1000 mg  p.o. twice daily CBG monitoring with RI SS. Check hemoglobin A1c.     Hyperlipidemia Associated with: Aortic atherosclerosis Continue atorvastatin  40 mg p.o. daily.     Adjustment disorder with depressed mood Continue sertraline  50 mg p.o. daily.     GERD without esophagitis Antiacid, H2 blocker or PPI as needed.  TRH will continue to follow the patient.  Subjective: No new complaints.  Physical Exam: Vitals:   04/28/24 2055 04/28/24 2057 04/29/24 0543 04/29/24 1418  BP: 99/73 (P) 99/72 99/73 124/72  Pulse: 88 (P) 88  (!) 48  Resp: 20  18 18   Temp: 97.9 F (36.6 C) (P) 97.9 F (36.6 C) (!) 97.4 F (36.3 C) 97.9 F (36.6 C)  TempSrc: Oral (P) Oral Oral Oral  SpO2: 100% (P) 100% 98% 100%  Weight:      Height:      General  No acute Distress Eyes: PERRL, lids and conjunctivae normal ENMT: Mucous membranes are moist.   Neck: normal, supple, no masses, no thyromegaly Respiratory: clear to auscultation bilaterally, no wheezing, no crackles. Normal respiratory effort. No accessory muscle use.  Cardiovascular: Regular rate and rhythm, no murmurs / rubs / gallops Abdomen: Soft, nontender nondistended. Musculoskeletal: no clubbing / cyanosis. No joint deformity upper and lower extremities.  Skin: no rashes, lesions, ulcers. No induration Neurologic: Facial asymmetry, moving extremity spontaneously, speech fluent. Psychiatric: Normal judgment and insight. Alert and oriented x 3. Normal mood.   Data Reviewed:    CBC    Component Value Date/Time   WBC 4.7 04/29/2024 1233   RBC 4.74 04/29/2024 1233   HGB 15.0 04/29/2024 1233   HGB 13.4 10/02/2011  0842   HCT 45.4 04/29/2024 1233   HCT 40.8 10/02/2011 0842   PLT 252 04/29/2024 1233   PLT 262 10/02/2011 0842   MCV 95.8 04/29/2024 1233   MCV 95 10/02/2011 0842   MCH 31.6 04/29/2024 1233   MCHC 33.0 04/29/2024 1233   RDW 13.4 04/29/2024 1233   RDW 14.4 10/02/2011 0842   LYMPHSABS 1.7 04/29/2024 1233   MONOABS 1.0  04/29/2024 1233   EOSABS 0.2 04/29/2024 1233   BASOSABS 0.1 04/29/2024 1233    CMP     Component Value Date/Time   NA 137 04/29/2024 1233   NA 146 (H) 10/03/2011 0533   K 4.2 04/29/2024 1233   K 3.8 10/03/2011 0533   CL 104 04/29/2024 1233   CL 111 (H) 10/03/2011 0533   CO2 21 (L) 04/29/2024 1233   CO2 24 10/03/2011 0533   GLUCOSE 116 (H) 04/29/2024 1233   GLUCOSE 107 (H) 10/03/2011 0533   BUN 23 04/29/2024 1233   BUN 8 10/03/2011 0533   CREATININE 1.65 (H) 04/29/2024 1233   CREATININE 1.11 10/03/2011 0533   CALCIUM  9.1 04/29/2024 1233   CALCIUM  8.7 10/03/2011 0533   PROT 6.9 04/26/2024 0557   PROT 7.1 10/02/2011 0842   ALBUMIN 3.6 04/26/2024 0557   ALBUMIN 3.6 10/02/2011 0842   AST 14 (L) 04/26/2024 0557   AST 14 (L) 10/02/2011 0842   ALT 6 04/26/2024 0557   ALT 18 10/02/2011 0842   ALKPHOS 60 04/26/2024 0557   ALKPHOS 89 10/02/2011 0842   BILITOT 0.9 04/26/2024 0557   BILITOT 0.3 10/02/2011 0842   GFRNONAA 46 (L) 04/29/2024 1233   GFRNONAA >60 10/03/2011 0533    I have reviewed pertinent nursing notes, vitals, labs, and images as necessary. I have ordered labwork to follow up on as indicated.  I have reviewed the last notes from staff over past 24 hours. I have discussed patient's care plan and test results with nursing staff, CM/SW, and other staff as appropriate.  Old records reviewed in assessment of this patient  Time spent: Greater than 50% of the 55 minute visit was spent in counseling/coordination of care for the patient as laid out in the A&P.  Time spent: 34 minutes.  Author: Shandel Busic, DO 04/29/2024 3:05 PM  For on call review [email or web address].

## 2024-04-30 ENCOUNTER — Other Ambulatory Visit (HOSPITAL_COMMUNITY): Payer: Self-pay

## 2024-04-30 DIAGNOSIS — A419 Sepsis, unspecified organism: Secondary | ICD-10-CM | POA: Diagnosis not present

## 2024-04-30 DIAGNOSIS — N390 Urinary tract infection, site not specified: Secondary | ICD-10-CM | POA: Diagnosis not present

## 2024-04-30 LAB — GLUCOSE, CAPILLARY
Glucose-Capillary: 102 mg/dL — ABNORMAL HIGH (ref 70–99)
Glucose-Capillary: 123 mg/dL — ABNORMAL HIGH (ref 70–99)

## 2024-04-30 LAB — CULTURE, BLOOD (ROUTINE X 2)
Culture: NO GROWTH
Special Requests: ADEQUATE

## 2024-04-30 MED ORDER — SULFAMETHOXAZOLE-TRIMETHOPRIM 800-160 MG PO TABS
1.0000 | ORAL_TABLET | Freq: Two times a day (BID) | ORAL | 0 refills | Status: AC
  Filled 2024-04-30: qty 10, 5d supply, fill #0
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
## 2024-04-30 NOTE — Progress Notes (Signed)
 Mobility Specialist Progress Note:   04/30/24 1042  Mobility  Activity Ambulated with assistance  Level of Assistance Standby assist, set-up cues, supervision of patient - no hands on  Assistive Device Cane  Distance Ambulated (ft) 190 ft  Activity Response Tolerated well  Mobility Referral Yes  Mobility visit 1 Mobility  Mobility Specialist Start Time (ACUTE ONLY) 1032  Mobility Specialist Stop Time (ACUTE ONLY) 1045  Mobility Specialist Time Calculation (min) (ACUTE ONLY) 13 min   Pt was received in bed and agreed to mobility. No complaints during ambulation. Returned to bed with all needs met. Call bell in reach.  [employer] - Mobility Specialist

## 2024-05-01 NOTE — Discharge Summary (Addendum)
 " Physician Discharge Summary   Patient: Jeremy [name] MRN: [id number] DOB: 1956/04/05  Admit date:     04/25/2024  Discharge date: 04/30/2024  Discharge Physician: Brigida Bureau   PCP: Sadie Manna, MD   Recommendations at discharge:    Discharge to home Follow up with PCP in 7-10 days. Complete antibiotics  Discharge Diagnoses: Principal Problem:   Sepsis secondary to UTI [facility]) Active Problems:   Essential hypertension   Diabetes mellitus, type 2 (HCC)   Aortic atherosclerosis   Hyperlipidemia   Adjustment disorder with depressed mood   Paroxysmal atrial fibrillation (HCC)   GERD without esophagitis   Coronary artery disease   Chronic systolic heart failure (HCC)   COPD (chronic obstructive pulmonary disease) (HCC)  Resolved Problems:   * No resolved hospital problems. * Metabolic encephalopathy Hospital Course: 68 y.o. male with medical history significant of treatment noncompliance, cannabis use, cocaine use, tobacco use, paroxysmal atrial fibrillation, stage III CKD, tobacco abuse, history of ESBL UTI, homelessness who presented BIBEMS to the emergency department on account of Dizziness, weakness and urinary incontinence.UA  was concerning for UTI and he met sepsis criteria . Patient was started on Merrem , Blood and urine cx obtained.    The 1/2 blood cultures have grown out ESBL E coli as has the urine. He received 5 days of Merrem  to which the organism is sensitive.   Repeat blood cultures have had no growth for 24 hours. Continue to monitor.  The patient was transitioned to oral Bactrim . He was discharged in fair condition on 04/30/2024.  Noted. On the date of discharge the patient's nurse and nurse techs reported that the patient was sexually inappropriate on multiple occassions after discharge including explosing himself, inappropriate touching, attempting to pull staff into the bed, and attempting to kiss staff. In future medical encounters, medical staff  should not enter this patient's room alone.   Assessment and Plan: Sepsis secondary to UTI [facility]) The patient met Sepsis criteria upon admission due to RR of 26, HR of 121, in the setting of an acute UTI. Sepsis has resolved. Has history of ESBL. The patient has received 5 days of meropenem . He has been transitioned to bactrim  to complete his course. Urine culture has grown out ESBL E. Coli. 1/2 blood culture has likewise grown out ESBL E. Coli. Repeat cultures have had no growth   Active Problems:   Essential hypertension   Chronic systolic heart failure (HCC) Continue carvedilol  6.25 mg p.o. twice daily. Continue Entresto  24-26 mg p.o. twice daily.     Paroxysmal atrial fibrillation (HCC) CHA?DS?-VASc Score of at least 5. Continue carvedilol  and rivaroxaban .   CKD 3a Noted. Monitor.     Coronary artery disease Continue beta-blocker and statin. On DOAC's old no antiplatelet therapy.     COPD (chronic obstructive pulmonary disease) (HCC) Bronchodilators as needed.     Diabetes mellitus, type 2 (HCC) Carbohydrate modified diet. Continue metformin  1000 mg p.o. twice daily CBG monitoring with RI SS. Check hemoglobin A1c.     Hyperlipidemia Associated with: Aortic atherosclerosis Continue atorvastatin  40 mg p.o. daily.     Adjustment disorder with depressed mood Continue sertraline  50 mg p.o. daily.     GERD without esophagitis Antiacid, H2 blocker or PPI as needed.  Consultants: None Procedures performed: None  Disposition: Home Diet recommendation:  Discharge Diet Orders (From admission, onward)     Start     Ordered   04/30/24 0000  Diet - low sodium heart healthy  04/30/24 1228   04/27/24 0000  Diet - low sodium heart healthy        04/27/24 1249           Carb modified diet DISCHARGE MEDICATION: Allergies as of 04/30/2024   No Known Allergies      Medication List     STOP taking these medications    acetaminophen  500 MG  tablet Commonly known as: TYLENOL    amoxicillin -clavulanate 875-125 MG tablet Commonly known as: AUGMENTIN    doxycycline  100 MG capsule Commonly known as: VIBRAMYCIN    traMADol  50 MG tablet Commonly known as: ULTRAM        TAKE these medications    albuterol  108 (90 Base) MCG/ACT inhaler Commonly known as: VENTOLIN  HFA Inhale 2 puffs into the lungs every 6 (six) hours as needed for wheezing or shortness of breath.   amiodarone  200 MG tablet Commonly known as: PACERONE  Take 1 tablet (200 mg total) by mouth daily.   atorvastatin  40 MG tablet Commonly known as: LIPITOR Take 1 tablet (40 mg total) by mouth daily.   carvedilol  6.25 MG tablet Commonly known as: COREG  Take 1 tablet (6.25 mg total) by mouth 2 (two) times daily.   feeding supplement Liqd Take 237 mLs by mouth 2 (two) times daily between meals.   metFORMIN  1000 MG tablet Commonly known as: GLUCOPHAGE  Take 1 tablet (1,000 mg total) by mouth daily with breakfast.   nitroGLYCERIN  0.4 MG SL tablet Commonly known as: NITROSTAT  Place 1 tablet (0.4 mg total) under the tongue every 5 (five) minutes as needed for chest pain up to 3 doses.   rivaroxaban  20 MG Tabs tablet Commonly known as: Xarelto  Take 1 tablet (20 mg total) by mouth daily at 5 pm   sacubitril -valsartan  24-26 MG Commonly known as: Entresto  Take 1 tablet by mouth 2 (two) times daily.   sertraline  50 MG tablet Commonly known as: ZOLOFT  Take 1 tablet (50 mg total) by mouth daily.   sulfamethoxazole -trimethoprim  800-160 MG tablet Commonly known as: Bactrim  DS Take 1 tablet by mouth 2 (two) times daily for 5 days.   torsemide  20 MG tablet Commonly known as: DEMADEX  Take 1 tablet (20 mg total) by mouth daily.        Discharge Exam: Filed Weights   04/25/24 0805 04/25/24 1426  Weight: 107.4 kg 112.4 kg   Exam:  Constitutional:  The patient is awake, alert, and oriented x 3. No acute distress. Eyes:  pupils and irises appear  normal Normal lids and conjunctivae ENMT:  grossly normal hearing  Lips appear normal external ears, nose appear normal Oropharynx: mucosa, tongue,posterior pharynx appear normal Neck:  neck appears normal, no masses, normal ROM, supple no thyromegaly Respiratory:  No increased work of breathing. No wheezes, rales, or rhonchi No tactile fremitus Cardiovascular:  Regular rate and rhythm No murmurs, ectopy, or gallups. No lateral PMI. No thrills. Abdomen:  Abdomen is soft, non-tender, non-distended No hernias, masses, or organomegaly Normoactive bowel sounds.  Musculoskeletal:  No cyanosis, clubbing, or edema Skin:  No rashes, lesions, ulcers palpation of skin: no induration or nodules Neurologic:  CN 2-12 intact Sensation all 4 extremities intact Psychiatric:  Mental status Mood, affect appropriate Orientation to person, place, time  judgment and insight appear intact   Condition at discharge: fair  The results of significant diagnostics from this hospitalization (including imaging, microbiology, ancillary and laboratory) are listed below for reference.   Imaging Studies: CT HEAD WO CONTRAST ( ) Result Date: 04/25/2024 EXAM: CT HEAD WITHOUT CONTRAST 04/25/2024  09:19:22 AM TECHNIQUE: CT of the head was performed without the administration of intravenous contrast. Automated exposure control, iterative reconstruction, and/or weight based adjustment of the mA/kV was utilized to reduce the radiation dose to as low as reasonably achievable. COMPARISON: Head CT 03/21/2024 and MRI 01/02/2021. CLINICAL HISTORY: Delirium. FINDINGS: The examination is mildly motion degraded. BRAIN AND VENTRICLES: No acute large territory infarct, cranial hemorrhage, mass, midline shift, hydrocephalus, or extra-axial fluid collection is identified. There is mild cerebral atrophy. Patchy and confluent hypodensities in the cerebral white matter bilaterally are similar to the prior CT and nonspecific  but compatible with severe chronic small vessel ischemic disease. Chronic lacunar infarcts are again noted in the bilateral cerebral white matter, right basal ganglia, and both cerebellar hemispheres. Calcified atherosclerosis at the skull base. ORBITS: No acute abnormality. SINUSES: Trace left mastoid fluid. The included paranasal sinuses are well aerated. SOFT TISSUES AND SKULL: No acute soft tissue abnormality. No skull fracture. IMPRESSION: 1. No acute intracranial abnormality. 2. Severe chronic small vessel ischemic disease. Electronically signed by: Dasie Hamburg MD 04/25/2024 09:27 AM EST RP Workstation: [id number]   DG Chest 2 View Result Date: 04/25/2024 EXAM: 2 VIEW(S) XRAY OF THE CHEST 04/25/2024 08:38:40 AM COMPARISON: 03/21/2024 CLINICAL HISTORY: fever FINDINGS: LUNGS AND PLEURA: Low lung volumes. Interstitial opacities throughout both lungs. No pulmonary edema. No pleural effusion. No pneumothorax. HEART AND MEDIASTINUM: Mild cardiomegaly. Tortuous aorta with CABG markers and sternotomy wires. BONES AND SOFT TISSUES: Osteophytosis. IMPRESSION: 1. Interstitial opacities throughout both lungs, which may reflect bronchovascular crowding due to low lung volumes, interstitial edema, or atypical/viral infection . 2. Mild cardiomegaly. Electronically signed by: Rogelia Myers MD 04/25/2024 08:53 AM EST RP Workstation: [id number]    Microbiology: Results for orders placed or performed during the hospital encounter of 04/25/24  Blood culture (routine x 2)     Status: None   Collection Time: 04/25/24  9:28 AM   Specimen: BLOOD  Result Value Ref Range Status   Specimen Description   Final    BLOOD SITE NOT SPECIFIED Performed at [facility], [address], [place], [place] [address]    Special Requests   Final    BOTTLES DRAWN AEROBIC AND ANAEROBIC Blood Culture adequate volume Performed at [facility], [address], [place], [place] [address]     Culture   Final    NO GROWTH 5 DAYS Performed at [facility], [address], [place], [place] [address]    Report Status 04/30/2024 FINAL  Final  Blood culture (routine x 2)     Status: Abnormal   Collection Time: 04/25/24  9:28 AM   Specimen: BLOOD  Result Value Ref Range Status   Specimen Description   Final    BLOOD LEFT ANTECUBITAL Performed at [facility], [address], [place], [place] [address]    Special Requests   Final    BOTTLES DRAWN AEROBIC AND ANAEROBIC Blood Culture adequate volume Performed at [facility], [address] [place], [place] [address]    Culture  Setup Time   Final    GRAM NEGATIVE RODS ANAEROBIC BOTTLE ONLY CRITICAL RESULT CALLED TO, READ BACK BY AND VERIFIED WITH: PHARMD JUSTIN L [id number] FCP Performed at [facility], [address] [place], [place] [address]    Culture (A)  Final    ESCHERICHIA COLI Confirmed Extended Spectrum Beta-Lactamase Producer (ESBL).  In bloodstream infections from ESBL organisms, carbapenems are preferred over piperacillin /tazobactam. They  are shown to have a lower risk of mortality.    Report Status 04/29/2024 FINAL  Final   Organism ID, Bacteria ESCHERICHIA COLI  Final      Susceptibility   Escherichia coli - MIC*    AMPICILLIN >=32 RESISTANT Resistant     CEFAZOLIN  (NON-URINE) >=32 RESISTANT Resistant     CEFEPIME  16 RESISTANT Resistant     ERTAPENEM <=0.12 SENSITIVE Sensitive     CEFTRIAXONE  >=64 RESISTANT Resistant     CIPROFLOXACIN  >=4 RESISTANT Resistant     GENTAMICIN 2 SENSITIVE Sensitive     MEROPENEM  <=0.25 SENSITIVE Sensitive     TRIMETH /SULFA  <=20 SENSITIVE Sensitive     AMPICILLIN/SULBACTAM >=32 RESISTANT Resistant     PIP/TAZO Value in next row Sensitive      <=4 SENSITIVEThis is a modified FDA-approved test that has been validated and its performance characteristics determined by the reporting laboratory.  This laboratory is certified under the  Clinical Laboratory Improvement Amendments CLIA as qualified to perform high complexity clinical laboratory testing.    * ESCHERICHIA COLI  Blood Culture ID Panel (Reflexed)     Status: Abnormal   Collection Time: 04/25/24  9:28 AM  Result Value Ref Range Status   Enterococcus faecalis NOT DETECTED NOT DETECTED Final   Enterococcus Faecium NOT DETECTED NOT DETECTED Final   Listeria monocytogenes NOT DETECTED NOT DETECTED Final   Staphylococcus species NOT DETECTED NOT DETECTED Final   Staphylococcus aureus (BCID) NOT DETECTED NOT DETECTED Final   Staphylococcus epidermidis NOT DETECTED NOT DETECTED Final   Staphylococcus lugdunensis NOT DETECTED NOT DETECTED Final   Streptococcus species NOT DETECTED NOT DETECTED Final   Streptococcus agalactiae NOT DETECTED NOT DETECTED Final   Streptococcus pneumoniae NOT DETECTED NOT DETECTED Final   Streptococcus pyogenes NOT DETECTED NOT DETECTED Final   A.calcoaceticus-baumannii NOT DETECTED NOT DETECTED Final   Bacteroides fragilis NOT DETECTED NOT DETECTED Final   Enterobacterales DETECTED (A) NOT DETECTED Final    Comment: Enterobacterales represent a large order of gram negative bacteria, not a single organism. CRITICAL RESULT CALLED TO, READ BACK BY AND VERIFIED WITH: PHARMD JUSTIN L [id number] FCP    Enterobacter cloacae complex NOT DETECTED NOT DETECTED Final   Escherichia coli DETECTED (A) NOT DETECTED Final    Comment: CRITICAL RESULT CALLED TO, READ BACK BY AND VERIFIED WITH: PHARMD JUSTIN L [id number] FCP    Klebsiella aerogenes NOT DETECTED NOT DETECTED Final   Klebsiella oxytoca NOT DETECTED NOT DETECTED Final   Klebsiella pneumoniae NOT DETECTED NOT DETECTED Final   Proteus species NOT DETECTED NOT DETECTED Final   Salmonella species NOT DETECTED NOT DETECTED Final   Serratia marcescens NOT DETECTED NOT DETECTED Final   Haemophilus influenzae NOT DETECTED NOT DETECTED Final   Neisseria meningitidis NOT DETECTED NOT DETECTED Final    Pseudomonas aeruginosa NOT DETECTED NOT DETECTED Final   Stenotrophomonas maltophilia NOT DETECTED NOT DETECTED Final   Candida albicans NOT DETECTED NOT DETECTED Final   Candida auris NOT DETECTED NOT DETECTED Final   Candida glabrata NOT DETECTED NOT DETECTED Final   Candida krusei NOT DETECTED NOT DETECTED Final   Candida parapsilosis NOT DETECTED NOT DETECTED Final   Candida tropicalis NOT DETECTED NOT DETECTED Final   Cryptococcus neoformans/gattii NOT DETECTED NOT DETECTED Final   CTX-M ESBL DETECTED (A) NOT DETECTED Final    Comment: CRITICAL RESULT CALLED TO, READ BACK BY AND VERIFIED WITH: PHARMD JUSTIN L [id number] FCP (NOTE) Extended spectrum beta-lactamase detected. Recommend a carbapenem as initial therapy.  Carbapenem resistance IMP NOT DETECTED NOT DETECTED Final   Carbapenem resistance KPC NOT DETECTED NOT DETECTED Final   Carbapenem resistance NDM NOT DETECTED NOT DETECTED Final   Carbapenem resist OXA 48 LIKE NOT DETECTED NOT DETECTED Final   Carbapenem resistance VIM NOT DETECTED NOT DETECTED Final    Comment: Performed at [facility], [address], [place], [place] [address]  Resp panel by RT-PCR (RSV, Flu A&B, Covid) Anterior Nasal Swab     Status: None   Collection Time: 04/25/24  9:45 AM   Specimen: Anterior Nasal Swab  Result Value Ref Range Status   SARS Coronavirus 2 by RT PCR NEGATIVE NEGATIVE Final    Comment: (NOTE) SARS-CoV-2 target nucleic acids are NOT DETECTED.  The SARS-CoV-2 RNA is generally detectable in upper respiratory specimens during the acute phase of infection. The lowest concentration of SARS-CoV-2 viral copies this assay can detect is 138 copies/mL. A negative result does not preclude SARS-Cov-2 infection and should not be used as the sole basis for treatment or other patient management decisions. A negative result may occur with  improper specimen collection/handling, submission of specimen other than nasopharyngeal swab,  presence of viral mutation(s) within the areas targeted by this assay, and inadequate number of viral copies(<138 copies/mL). A negative result must be combined with clinical observations, patient history, and epidemiological information. The expected result is Negative.  Fact Sheet for Patients:  bloggercourse.com  Fact Sheet for Healthcare Providers:  seriousbroker.it  This test is no t yet approved or cleared by the [place]  FDA and  has been authorized for detection and/or diagnosis of SARS-CoV-2 by FDA under an Emergency Use Authorization (EUA). This EUA will remain  in effect (meaning this test can be used) for the duration of the COVID-19 declaration under Section 564(b)(1) of the Act, 21 U.S.C.section 360bbb-3(b)(1), unless the authorization is terminated  or revoked sooner.       Influenza A by PCR NEGATIVE NEGATIVE Final   Influenza B by PCR NEGATIVE NEGATIVE Final    Comment: (NOTE) The Xpert Xpress SARS-CoV-2/FLU/RSV plus assay is intended as an aid in the diagnosis of influenza from Nasopharyngeal swab specimens and should not be used as a sole basis for treatment. Nasal washings and aspirates are unacceptable for Xpert Xpress SARS-CoV-2/FLU/RSV testing.  Fact Sheet for Patients: bloggercourse.com  Fact Sheet for Healthcare Providers: seriousbroker.it  This test is not yet approved or cleared by the [place]  FDA and has been authorized for detection and/or diagnosis of SARS-CoV-2 by FDA under an Emergency Use Authorization (EUA). This EUA will remain in effect (meaning this test can be used) for the duration of the COVID-19 declaration under Section 564(b)(1) of the Act, 21 U.S.C. section 360bbb-3(b)(1), unless the authorization is terminated or revoked.     Resp Syncytial Virus by PCR NEGATIVE NEGATIVE Final    Comment: (NOTE) Fact Sheet for  Patients: bloggercourse.com  Fact Sheet for Healthcare Providers: seriousbroker.it  This test is not yet approved or cleared by the [place]  FDA and has been authorized for detection and/or diagnosis of SARS-CoV-2 by FDA under an Emergency Use Authorization (EUA). This EUA will remain in effect (meaning this test can be used) for the duration of the COVID-19 declaration under Section 564(b)(1) of the Act, 21 U.S.C. section 360bbb-3(b)(1), unless the authorization is terminated or revoked.  Performed at [facility], [address] [place], [place] [address]   Urine Culture     Status: Abnormal   Collection  Time: 04/25/24 10:55 AM   Specimen: Urine, Clean Catch  Result Value Ref Range Status   Specimen Description   Final    URINE, CLEAN CATCH Performed at [facility], [address], [place], [place] [address]    Special Requests   Final    NONE Performed at [facility], [address], [place], [place] [address]    Culture (A)  Final    >=100,000 COLONIES/mL ESCHERICHIA COLI Confirmed Extended Spectrum Beta-Lactamase Producer (ESBL).  In bloodstream infections from ESBL organisms, carbapenems are preferred over piperacillin /tazobactam. They are shown to have a lower risk of mortality.    Report Status 04/27/2024 FINAL  Final   Organism ID, Bacteria ESCHERICHIA COLI (A)  Final      Susceptibility   Escherichia coli - MIC*    AMPICILLIN >=32 RESISTANT Resistant     CEFAZOLIN  (URINE) Value in next row Resistant      >=32 RESISTANTThis is a modified FDA-approved test that has been validated and its performance characteristics determined by the reporting laboratory.  This laboratory is certified under the Clinical Laboratory Improvement Amendments CLIA as qualified to perform high complexity clinical laboratory testing.    CEFEPIME  Value in next row Resistant       >=32 RESISTANTThis is a modified FDA-approved test that has been validated and its performance characteristics determined by the reporting laboratory.  This laboratory is certified under the Clinical Laboratory Improvement Amendments CLIA as qualified to perform high complexity clinical laboratory testing.    ERTAPENEM Value in next row Sensitive      >=32 RESISTANTThis is a modified FDA-approved test that has been validated and its performance characteristics determined by the reporting laboratory.  This laboratory is certified under the Clinical Laboratory Improvement Amendments CLIA as qualified to perform high complexity clinical laboratory testing.    CEFTRIAXONE  Value in next row Resistant      >=32 RESISTANTThis is a modified FDA-approved test that has been validated and its performance characteristics determined by the reporting laboratory.  This laboratory is certified under the Clinical Laboratory Improvement Amendments CLIA as qualified to perform high complexity clinical laboratory testing.    CIPROFLOXACIN  Value in next row Resistant      >=32 RESISTANTThis is a modified FDA-approved test that has been validated and its performance characteristics determined by the reporting laboratory.  This laboratory is certified under the Clinical Laboratory Improvement Amendments CLIA as qualified to perform high complexity clinical laboratory testing.    GENTAMICIN Value in next row Sensitive      >=32 RESISTANTThis is a modified FDA-approved test that has been validated and its performance characteristics determined by the reporting laboratory.  This laboratory is certified under the Clinical Laboratory Improvement Amendments CLIA as qualified to perform high complexity clinical laboratory testing.    NITROFURANTOIN Value in next row Resistant      >=32 RESISTANTThis is a modified FDA-approved test that has been validated and its performance characteristics determined by the reporting laboratory.  This  laboratory is certified under the Clinical Laboratory Improvement Amendments CLIA as qualified to perform high complexity clinical laboratory testing.    TRIMETH /SULFA  Value in next row Sensitive      >=32 RESISTANTThis is a modified FDA-approved test that has been validated and its performance characteristics determined by the reporting laboratory.  This laboratory is certified under the Clinical Laboratory Improvement Amendments CLIA as qualified to perform high complexity clinical laboratory testing.    AMPICILLIN/SULBACTAM Value in next row Resistant      >=  32 RESISTANTThis is a modified FDA-approved test that has been validated and its performance characteristics determined by the reporting laboratory.  This laboratory is certified under the Clinical Laboratory Improvement Amendments CLIA as qualified to perform high complexity clinical laboratory testing.    PIP/TAZO Value in next row Sensitive      <=4 SENSITIVEThis is a modified FDA-approved test that has been validated and its performance characteristics determined by the reporting laboratory.  This laboratory is certified under the Clinical Laboratory Improvement Amendments CLIA as qualified to perform high complexity clinical laboratory testing.    MEROPENEM  Value in next row Sensitive      <=4 SENSITIVEThis is a modified FDA-approved test that has been validated and its performance characteristics determined by the reporting laboratory.  This laboratory is certified under the Clinical Laboratory Improvement Amendments CLIA as qualified to perform high complexity clinical laboratory testing.    * >=100,000 COLONIES/mL ESCHERICHIA COLI  Culture, blood (Routine X 2) w Reflex to ID Panel     Status: None (Preliminary result)   Collection Time: 04/28/24  5:17 PM   Specimen: BLOOD LEFT ARM  Result Value Ref Range Status   Specimen Description   Final    BLOOD LEFT ARM Performed at [facility], [address] [place], [place] [address]     Special Requests   Final    BOTTLES DRAWN AEROBIC AND ANAEROBIC Blood Culture adequate volume Performed at [facility], [address], [place], [place] [address]    Culture   Final    NO GROWTH 2 DAYS Performed at [facility], [address], [place], [place] [address]    Report Status PENDING  Incomplete  Culture, blood (Routine X 2) w Reflex to ID Panel     Status: None (Preliminary result)   Collection Time: 04/28/24  5:57 PM   Specimen: BLOOD RIGHT HAND  Result Value Ref Range Status   Specimen Description   Final    BLOOD RIGHT HAND Performed at [facility], [address], [place], [place] [address]    Special Requests   Final    BOTTLES DRAWN AEROBIC ONLY Blood Culture results may not be optimal due to an inadequate volume of blood received in culture bottles Performed at [facility] [facility], [address], [place], [place] [address]    Culture   Final    NO GROWTH 2 DAYS Performed at [facility], [address] [place], [place] [address]    Report Status PENDING  Incomplete    Labs: CBC: Recent Labs  Lab 04/25/24 0943 04/26/24 0557 04/29/24 1233  WBC 10.9* 8.2 4.7  NEUTROABS 7.3  --  1.7  HGB 13.9 14.2 15.0  HCT 42.6 42.3 45.4  MCV 96.8 93.8 95.8  PLT 232 221 252   Basic Metabolic Panel: Recent Labs  Lab 04/25/24 0943 04/26/24 0557 04/29/24 1233  NA 138 136 137  K 4.2 3.9 4.2  CL 106 107 104  CO2 21* 17* 21*  GLUCOSE 96 99 116*  BUN 19 18 23   CREATININE 1.48* 1.49* 1.65*  CALCIUM  9.7 9.1 9.1   Liver Function Tests: Recent Labs  Lab 04/25/24 0943 04/26/24 0557  AST 16 14*  ALT 8 6  ALKPHOS 76 60  BILITOT 0.5 0.9  PROT 7.4 6.9  ALBUMIN 4.1 3.6   CBG: Recent Labs  Lab 04/29/24 1145 04/29/24 1639 04/29/24 1950 04/30/24 0755 04/30/24 1202  GLUCAP 151* 107* 108*  102* 123*    Discharge time spent: greater than 30 minutes.  Signed: Rayhaan Huster, DO [facility] 05/01/2024 "

## 2024-05-03 LAB — CULTURE, BLOOD (ROUTINE X 2)
Culture: NO GROWTH
Culture: NO GROWTH
Special Requests: ADEQUATE

## 2024-05-15 ENCOUNTER — Other Ambulatory Visit: Payer: Self-pay

## 2024-05-15 ENCOUNTER — Emergency Department (HOSPITAL_COMMUNITY)
Admission: EM | Admit: 2024-05-15 | Discharge: 2024-05-15 | Disposition: A | Discharge status: Home / Self Care | Attending: Emergency Medicine | Admitting: Emergency Medicine

## 2024-05-15 DIAGNOSIS — T68XXXA Hypothermia, initial encounter: Secondary | ICD-10-CM

## 2024-05-15 LAB — I-STAT CG4 LACTIC ACID, ED: Lactic Acid, Venous: 1.6 mmol/L (ref 0.5–1.9)

## 2024-05-15 LAB — BLOOD GAS, VENOUS
Acid-base deficit: 4 mmol/L — ABNORMAL HIGH (ref 0.0–2.0)
Bicarbonate: 22.6 mmol/L (ref 20.0–28.0)
O2 Saturation: 47.1 %
Patient temperature: 37
pCO2, Ven: 46 mmHg (ref 44–60)
pH, Ven: 7.3 (ref 7.25–7.43)
pO2, Ven: 33 mmHg (ref 32–45)

## 2024-05-15 LAB — CBC WITH DIFFERENTIAL/PLATELET
Abs Immature Granulocytes: 0.01 K/uL (ref 0.00–0.07)
Basophils Absolute: 0.1 K/uL (ref 0.0–0.1)
Basophils Relative: 1 %
Eosinophils Absolute: 0.2 K/uL (ref 0.0–0.5)
Eosinophils Relative: 3 %
HCT: 46.8 % (ref 39.0–52.0)
Hemoglobin: 15.2 g/dL (ref 13.0–17.0)
Immature Granulocytes: 0 %
Lymphocytes Relative: 35 %
Lymphs Abs: 2.1 K/uL (ref 0.7–4.0)
MCH: 31.1 pg (ref 26.0–34.0)
MCHC: 32.5 g/dL (ref 30.0–36.0)
MCV: 95.9 fL (ref 80.0–100.0)
Monocytes Absolute: 0.5 K/uL (ref 0.1–1.0)
Monocytes Relative: 8 %
Neutro Abs: 3.3 K/uL (ref 1.7–7.7)
Neutrophils Relative %: 53 %
Platelets: 259 K/uL (ref 150–400)
RBC: 4.88 MIL/uL (ref 4.22–5.81)
RDW: 14 % (ref 11.5–15.5)
WBC: 6.1 K/uL (ref 4.0–10.5)
nRBC: 0 % (ref 0.0–0.2)

## 2024-05-15 LAB — URINALYSIS, W/ REFLEX TO CULTURE (INFECTION SUSPECTED)
Bacteria, UA: NONE SEEN
Bilirubin Urine: NEGATIVE
Glucose, UA: NEGATIVE mg/dL
Hgb urine dipstick: NEGATIVE
Ketones, ur: NEGATIVE mg/dL
Leukocytes,Ua: NEGATIVE
Nitrite: NEGATIVE
Protein, ur: NEGATIVE mg/dL
Specific Gravity, Urine: 1.018 (ref 1.005–1.030)
pH: 5 (ref 5.0–8.0)

## 2024-05-15 LAB — COMPREHENSIVE METABOLIC PANEL WITH GFR
ALT: 9 U/L (ref 0–44)
AST: 18 U/L (ref 15–41)
Albumin: 4 g/dL (ref 3.5–5.0)
Alkaline Phosphatase: 68 U/L (ref 38–126)
Anion gap: 11 (ref 5–15)
BUN: 29 mg/dL — ABNORMAL HIGH (ref 8–23)
CO2: 20 mmol/L — ABNORMAL LOW (ref 22–32)
Calcium: 8.9 mg/dL (ref 8.9–10.3)
Chloride: 110 mmol/L (ref 98–111)
Creatinine, Ser: 1.42 mg/dL — ABNORMAL HIGH (ref 0.61–1.24)
GFR, Estimated: 54 mL/min — ABNORMAL LOW (ref >60)
Glucose, Bld: 106 mg/dL — ABNORMAL HIGH (ref 70–99)
Potassium: 4 mmol/L (ref 3.5–5.1)
Sodium: 141 mmol/L (ref 135–145)
Total Bilirubin: 0.9 mg/dL (ref 0.0–1.2)
Total Protein: 7.1 g/dL (ref 6.5–8.1)

## 2024-05-15 LAB — CK: Total CK: 210 U/L (ref 49–397)
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
## 2024-05-21 ENCOUNTER — Other Ambulatory Visit: Payer: Self-pay

## 2024-05-21 ENCOUNTER — Telehealth: Admitting: Nurse Practitioner

## 2024-05-21 VITALS — BP 137/77 | HR 119

## 2024-05-21 DIAGNOSIS — E782 Mixed hyperlipidemia: Secondary | ICD-10-CM

## 2024-05-21 DIAGNOSIS — I2089 Other forms of angina pectoris: Secondary | ICD-10-CM

## 2024-05-21 DIAGNOSIS — I4811 Longstanding persistent atrial fibrillation: Secondary | ICD-10-CM

## 2024-05-21 DIAGNOSIS — E119 Type 2 diabetes mellitus without complications: Secondary | ICD-10-CM

## 2024-05-21 DIAGNOSIS — F339 Major depressive disorder, recurrent, unspecified: Secondary | ICD-10-CM

## 2024-05-21 DIAGNOSIS — J449 Chronic obstructive pulmonary disease, unspecified: Secondary | ICD-10-CM

## 2024-05-21 DIAGNOSIS — I509 Heart failure, unspecified: Secondary | ICD-10-CM

## 2024-05-21 LAB — GLUCOSE, POCT (MANUAL RESULT ENTRY): POC Glucose: 156 mg/dL — AB (ref 70–99)

## 2024-05-21 MED ORDER — AMIODARONE HCL 200 MG PO TABS
200.0000 mg | ORAL_TABLET | Freq: Every day | ORAL | 0 refills | Status: DC
  Filled 2024-05-21: qty 30, 30d supply, fill #0

## 2024-05-21 MED ORDER — METFORMIN HCL 1000 MG PO TABS
1000.0000 mg | ORAL_TABLET | Freq: Every day | ORAL | 0 refills | Status: DC
  Filled 2024-05-21: qty 30, 30d supply, fill #0

## 2024-05-21 MED ORDER — ALBUTEROL SULFATE HFA 108 (90 BASE) MCG/ACT IN AERS
2.0000 | INHALATION_SPRAY | Freq: Four times a day (QID) | RESPIRATORY_TRACT | 0 refills | Status: DC | PRN
  Filled 2024-05-21: qty 6.7, 25d supply, fill #0

## 2024-05-21 MED ORDER — SACUBITRIL-VALSARTAN 24-26 MG PO TABS
1.0000 | ORAL_TABLET | Freq: Two times a day (BID) | ORAL | 0 refills | Status: DC
  Filled 2024-05-21: qty 30, 15d supply, fill #0

## 2024-05-21 MED ORDER — SERTRALINE HCL 50 MG PO TABS
50.0000 mg | ORAL_TABLET | Freq: Every day | ORAL | 0 refills | Status: DC
  Filled 2024-05-21: qty 30, 30d supply, fill #0

## 2024-05-21 MED ORDER — ATORVASTATIN CALCIUM 40 MG PO TABS
40.0000 mg | ORAL_TABLET | Freq: Every day | ORAL | 0 refills | Status: DC
  Filled 2024-05-21: qty 30, 30d supply, fill #0

## 2024-05-21 MED ORDER — RIVAROXABAN 20 MG PO TABS
20.0000 mg | ORAL_TABLET | Freq: Every day | ORAL | 0 refills | Status: DC
  Filled 2024-05-21: qty 30, 30d supply, fill #0

## 2024-05-21 MED ORDER — TORSEMIDE 20 MG PO TABS
20.0000 mg | ORAL_TABLET | Freq: Every day | ORAL | 0 refills | Status: AC
  Filled 2024-05-21: qty 30, 30d supply, fill #0

## 2024-05-21 MED ORDER — NITROGLYCERIN 0.4 MG SL SUBL
0.4000 mg | SUBLINGUAL_TABLET | SUBLINGUAL | 0 refills | Status: AC | PRN
  Filled 2024-05-21: qty 25, 8d supply, fill #0

## 2024-05-21 MED ORDER — CARVEDILOL 6.25 MG PO TABS
6.2500 mg | ORAL_TABLET | Freq: Two times a day (BID) | ORAL | 0 refills | Status: DC
  Filled 2024-05-21: qty 30, 15d supply, fill #0

## 2024-05-21 NOTE — Progress Notes (Signed)
 Acute Video Visit    Virtual Visit Consent:   Muadh Creasy, you are scheduled for a virtual visit with a [place] provider today.     Just as with appointments in the office, your consent must be obtained to participate.  Your consent will be active for this visit and any virtual visit you may have with one of our providers in the next 365 days.     If you have a MyChart account, a copy of this consent can be sent to you electronically.  All virtual visits are billed to your insurance company just like a traditional visit in the office.    If the connection with a video visit is poor, the visit may have to be switched to a telephone visit.  With either a video or telephone visit, we are not always able to ensure that we have a secure connection.     I need to obtain your verbal consent now.   Are you willing to proceed with your visit today?    Cleave Ternes has provided verbal consent on 05/21/2024 for a virtual visit (video or telephone).   [name], FNP  Date: 05/21/2024 12:06 PM  Subjective:     Patient ID: Jeremy [name], male    DOB: 03-12-1956, 68 y.o.   MRN: [id number]  LILLETTE [name], connected with  Eliceo Gladu  ([id number], 10-Jun-1956) on 05/21/24 at 12:00 PM EST by a video-enabled telemedicine application and verified that I am speaking with the correct person using two identifiers.   Location: Patient: [facility]  Provider: Virtual Visit Location Provider: Home Office CCN: Mitzie Breen   I discussed the limitations of evaluation and management by telemedicine and the availability of in person appointments. The patient expressed understanding and agreed to proceed.      HPI  Mohit Zirbes is a 68 y.o. who identifies as a male who was assigned male at birth, and is being seen today for assistance with medication refills. He has lost all of his medications and is unable to get them refilled.   He is presenting to the [facility] office today at the [facility]. Medical  history is significant for Afib, HTM, PE, Heart Failure, CAD, COPD, GERD, DM2, CKD, history of substance abuse.Currently un housed.   Main concern today is to get his medications refills so he can continue them, CCN nurse will assist with getting medication from [facility]    He was recently in the ED once for delirium and once for hypothermia. He is working on housing with his case worker at the [facility].    156 Glucose today after eating         Objective:    BP 137/77   Pulse (!) 119  BP Readings from Last 3 Encounters:  05/21/24 137/77  05/15/24 (!) 112/57  04/30/24 108/62      Physical Exam Constitutional:      General: He is not in acute distress.    Appearance: Normal appearance.  HENT:     Nose: Nose normal.  Pulmonary:     Effort: Pulmonary effort is normal.  Musculoskeletal:     Cervical back: Neck supple.  Neurological:     Mental Status: He is alert and oriented to person, place, and time.  Psychiatric:        Mood and Affect: Mood normal.        Assessment & Plan:   Problem List Items Addressed This Visit       Cardiovascular  and Mediastinum   Afib (HCC)   Relevant Medications   amiodarone  (PACERONE ) 200 MG tablet   atorvastatin  (LIPITOR) 40 MG tablet   carvedilol  (COREG ) 6.25 MG tablet   rivaroxaban  (XARELTO ) 20 MG TABS tablet   sacubitril -valsartan  (ENTRESTO ) 24-26 MG   torsemide  (DEMADEX ) 20 MG tablet   nitroGLYCERIN  (NITROSTAT ) 0.4 MG SL tablet     Respiratory   COPD (chronic obstructive pulmonary disease) (HCC)   Relevant Medications   albuterol  (VENTOLIN  HFA) 108 (90 Base) MCG/ACT inhaler     Other   Hyperlipidemia   Relevant Medications   amiodarone  (PACERONE ) 200 MG tablet   atorvastatin  (LIPITOR) 40 MG tablet   carvedilol  (COREG ) 6.25 MG tablet   rivaroxaban  (XARELTO ) 20 MG TABS tablet   sacubitril -valsartan  (ENTRESTO ) 24-26 MG   torsemide  (DEMADEX ) 20 MG tablet   nitroGLYCERIN  (NITROSTAT ) 0.4 MG SL tablet   Other Visit  Diagnoses       Angina of effort    -  Primary   Relevant Medications   amiodarone  (PACERONE ) 200 MG tablet   atorvastatin  (LIPITOR) 40 MG tablet   carvedilol  (COREG ) 6.25 MG tablet   rivaroxaban  (XARELTO ) 20 MG TABS tablet   sacubitril -valsartan  (ENTRESTO ) 24-26 MG   torsemide  (DEMADEX ) 20 MG tablet   nitroGLYCERIN  (NITROSTAT ) 0.4 MG SL tablet     Congestive heart failure, unspecified HF chronicity, unspecified heart failure type (HCC)       Relevant Medications   amiodarone  (PACERONE ) 200 MG tablet   atorvastatin  (LIPITOR) 40 MG tablet   carvedilol  (COREG ) 6.25 MG tablet   rivaroxaban  (XARELTO ) 20 MG TABS tablet   sacubitril -valsartan  (ENTRESTO ) 24-26 MG   torsemide  (DEMADEX ) 20 MG tablet   nitroGLYCERIN  (NITROSTAT ) 0.4 MG SL tablet     Diabetes mellitus without complication (HCC)       Relevant Medications   atorvastatin  (LIPITOR) 40 MG tablet   metFORMIN  (GLUCOPHAGE ) 1000 MG tablet     Depression, recurrent       Relevant Medications   sertraline  (ZOLOFT ) 50 MG tablet       Meds ordered this encounter  Medications   albuterol  (VENTOLIN  HFA) 108 (90 Base) MCG/ACT inhaler    Sig: Inhale 2 puffs into the lungs every 6 (six) hours as needed for wheezing or shortness of breath.    Dispense:  6.7 g    Refill:  0    May substitute insurance preferred   amiodarone  (PACERONE ) 200 MG tablet    Sig: Take 1 tablet (200 mg total) by mouth daily.    Dispense:  30 tablet    Refill:  0   atorvastatin  (LIPITOR) 40 MG tablet    Sig: Take 1 tablet (40 mg total) by mouth daily.    Dispense:  30 tablet    Refill:  0   carvedilol  (COREG ) 6.25 MG tablet    Sig: Take 1 tablet (6.25 mg total) by mouth 2 (two) times daily.    Dispense:  30 tablet    Refill:  0   metFORMIN  (GLUCOPHAGE ) 1000 MG tablet    Sig: Take 1 tablet (1,000 mg total) by mouth daily with breakfast.    Dispense:  30 tablet    Refill:  0   rivaroxaban  (XARELTO ) 20 MG TABS tablet    Sig: Take 1 tablet (20 mg total)  by mouth daily at 5 pm    Dispense:  30 tablet    Refill:  0   sacubitril -valsartan  (ENTRESTO ) 24-26 MG    Sig: Take 1  tablet by mouth 2 (two) times daily.    Dispense:  30 tablet    Refill:  0   sertraline  (ZOLOFT ) 50 MG tablet    Sig: Take 1 tablet (50 mg total) by mouth daily.    Dispense:  30 tablet    Refill:  0   torsemide  (DEMADEX ) 20 MG tablet    Sig: Take 1 tablet (20 mg total) by mouth daily.    Dispense:  30 tablet    Refill:  0    Lost medications given at time of DC from ED yesterday   nitroGLYCERIN  (NITROSTAT ) 0.4 MG SL tablet    Sig: Place 1 tablet (0.4 mg total) under the tongue every 5 (five) minutes as needed for chest pain up to 3 doses.    Dispense:  25 tablet    Refill:  0    No follow-ups on file.  Follow Up Instructions: I discussed the assessment and treatment plan with the patient. The patient was provided an opportunity to ask questions and all were answered. The patient agreed with the plan and demonstrated an understanding of the instructions.  A copy of instructions were sent to the patient via MyChart unless otherwise noted below.    The patient was advised to call back or seek an in-person evaluation if the symptoms worsen or if the condition fails to improve as anticipated.    [name], FNP  **Disclaimer: This note may have been dictated with voice recognition software. Similar sounding words can inadvertently be transcribed and this note may contain transcription errors which may not have been corrected upon publication of note.**

## 2024-05-21 NOTE — Congregational Nurse Program (Signed)
  Dept: [phone]   Congregational Nurse Program Note  Date of Encounter: 05/21/2024  Past Medical History: Past Medical History:  Diagnosis Date   Atrial fibrillation, chronic (HCC)    CHF (congestive heart failure) (HCC)    Coronary artery disease    Diabetes mellitus without complication (HCC)    Dyslipidemia 08/07/2023   GERD without esophagitis 08/07/2023   Hypertension    OSA (obstructive sleep apnea) 11/24/2022   S/P CABG x 1     Encounter Details:  Community Questionnaire - 05/21/24 1145       Questionnaire   Ask client: Do you give verbal consent for me to treat you today? N/A    Student Assistance N/A    Location Patient Served  [facility]    Encounter Setting CN site    Population Status Unhoused    Insurance Medicaid;Medicare    Insurance/Financial Assistance Referral N/A    Medication Have Medication Insecurities;Patient Medications Reviewed;Provided Medication Assistance    Medical Provider Yes    Screening Referrals Made N/A    Medical Referrals Made [facility];Dental    Medical Appointment Completed [facility]    CNP Interventions Advocate/Support;Case Management;Navigate Healthcare System;Educate;Spiritual Care;Counsel    Screenings CN Performed Blood Pressure;Blood Glucose    ED Visit Averted Yes    Life-Saving Intervention Made Yes        Client to RN office. Client asking for assistance with med pick up as well as having multiple teeth removed. Client denies pain in mouth at this time. RN assisted with tele health visit with [name], NP. NP calling in refill medications to [facility]. RN to pick up. RN to begin making calls for energy manager. Client provided with spiritual and emotional care.

## 2024-05-23 NOTE — Congregational Nurse Program (Signed)
°  Dept: [phone]   Congregational Nurse Program Note  Date of Encounter: 05/23/2024  Past Medical History: Past Medical History:  Diagnosis Date   Atrial fibrillation, chronic (HCC)    CHF (congestive heart failure) (HCC)    Coronary artery disease    Diabetes mellitus without complication (HCC)    Dyslipidemia 08/07/2023   GERD without esophagitis 08/07/2023   Hypertension    OSA (obstructive sleep apnea) 11/24/2022   S/P CABG x 1     Encounter Details:  Community Questionnaire - 05/23/24 1700       Questionnaire   Ask client: Do you give verbal consent for me to treat you today? Yes    Student Assistance N/A    Location Patient Served  [facility]    Encounter Setting CN site    Population Status Unhoused    Insurance Medicaid;Medicare    Insurance/Financial Assistance Referral N/A    Medication Have Medication Insecurities;Patient Medications Reviewed;Provided Medication Assistance    Medical Provider Yes    Screening Referrals Made N/A    Medical Referrals Made N/A    Medical Appointment Completed [facility]    CNP Interventions Advocate/Support;Case Management;Navigate Healthcare System;Educate;Spiritual Care;Counsel    Screenings CN Performed N/A    ED Visit Averted N/A    Life-Saving Intervention Made N/A          RN visited with client at [facility]. RN delivered medications to client. RN had been looking for client at [facility] to deliver medications. Medication dosage and frequency reviewed with client. RN set client's meds up in daily container. Client verbalized understanding. Discussed calls RN made to dentist. RN has left three messages with [facility] and has not had a return call. RN called [facility] and they are able to see client, but unable to sedate client which is his preference. [facility] does provide nitrous. Discussed both options with client and he will let RN know. No other acute needs at this time.

## 2024-05-29 NOTE — Congregational Nurse Program (Signed)
°  Dept: [phone]   Congregational Nurse Program Note  Date of Encounter: 05/29/2024  Past Medical History: Past Medical History:  Diagnosis Date   Atrial fibrillation, chronic (HCC)    CHF (congestive heart failure) (HCC)    Coronary artery disease    Diabetes mellitus without complication (HCC)    Dyslipidemia 08/07/2023   GERD without esophagitis 08/07/2023   Hypertension    OSA (obstructive sleep apnea) 11/24/2022   S/P CABG x 1     Encounter Details:  Community Questionnaire - 05/29/24 1130       Questionnaire   Ask client: Do you give verbal consent for me to treat you today? Yes    Student Assistance N/A    Location Patient Served  [facility]    Encounter Setting CN site    Population Status Unhoused    Insurance Medicaid;Medicare    Insurance/Financial Assistance Referral N/A    Medication Have Medication Insecurities;Patient Medications Reviewed;Provided Medication Assistance    Medical Provider Yes    Screening Referrals Made N/A    Medical Referrals Made Dental    Medical Appointment Completed [facility]    CNP Interventions Advocate/Support;Case Management;Navigate Healthcare System;Educate;Spiritual Care;Counsel    Screenings CN Performed N/A    ED Visit Averted Yes    Life-Saving Intervention Made N/A        RN saw client today at [facility]. Updated client on dental appointment that is on 06/12/24 at 11 am. RN arranged [employer] transportation, client to picked up at 10:25. Client informed and given written information and my card. Client denies any other needs at this time. Will continue to follow.

## 2024-06-05 ENCOUNTER — Emergency Department (HOSPITAL_COMMUNITY): Admission: EM | Admit: 2024-06-05 | Discharge: 2024-06-06 | Disposition: A | Discharge status: Home / Self Care

## 2024-06-05 ENCOUNTER — Emergency Department (HOSPITAL_COMMUNITY)

## 2024-06-05 DIAGNOSIS — E119 Type 2 diabetes mellitus without complications: Secondary | ICD-10-CM | POA: Insufficient documentation

## 2024-06-05 DIAGNOSIS — R0602 Shortness of breath: Secondary | ICD-10-CM | POA: Insufficient documentation

## 2024-06-05 DIAGNOSIS — Z7901 Long term (current) use of anticoagulants: Secondary | ICD-10-CM | POA: Insufficient documentation

## 2024-06-05 DIAGNOSIS — I110 Hypertensive heart disease with heart failure: Secondary | ICD-10-CM | POA: Diagnosis not present

## 2024-06-05 DIAGNOSIS — I509 Heart failure, unspecified: Secondary | ICD-10-CM | POA: Insufficient documentation

## 2024-06-05 DIAGNOSIS — I4891 Unspecified atrial fibrillation: Secondary | ICD-10-CM | POA: Diagnosis not present

## 2024-06-05 DIAGNOSIS — F17200 Nicotine dependence, unspecified, uncomplicated: Secondary | ICD-10-CM | POA: Diagnosis not present

## 2024-06-05 DIAGNOSIS — Z7984 Long term (current) use of oral hypoglycemic drugs: Secondary | ICD-10-CM | POA: Diagnosis not present

## 2024-06-05 LAB — BASIC METABOLIC PANEL WITH GFR
Anion gap: 8 (ref 5–15)
BUN: 17 mg/dL (ref 8–23)
CO2: 26 mmol/L (ref 22–32)
Calcium: 9.7 mg/dL (ref 8.9–10.3)
Chloride: 108 mmol/L (ref 98–111)
Creatinine, Ser: 1.45 mg/dL — ABNORMAL HIGH (ref 0.61–1.24)
GFR, Estimated: 53 mL/min — ABNORMAL LOW
Glucose, Bld: 97 mg/dL (ref 70–99)
Potassium: 4.6 mmol/L (ref 3.5–5.1)
Sodium: 142 mmol/L (ref 135–145)

## 2024-06-05 LAB — CBC
HCT: 42.5 % (ref 39.0–52.0)
Hemoglobin: 14 g/dL (ref 13.0–17.0)
MCH: 31.2 pg (ref 26.0–34.0)
MCHC: 32.9 g/dL (ref 30.0–36.0)
MCV: 94.7 fL (ref 80.0–100.0)
Platelets: 282 K/uL (ref 150–400)
RBC: 4.49 MIL/uL (ref 4.22–5.81)
RDW: 14 % (ref 11.5–15.5)
WBC: 7.2 K/uL (ref 4.0–10.5)
nRBC: 0 % (ref 0.0–0.2)

## 2024-06-05 LAB — RESP PANEL BY RT-PCR (RSV, FLU A&B, COVID)  RVPGX2
Influenza A by PCR: NEGATIVE
Influenza B by PCR: NEGATIVE
Resp Syncytial Virus by PCR: NEGATIVE
SARS Coronavirus 2 by RT PCR: NEGATIVE

## 2024-06-05 LAB — TROPONIN T, HIGH SENSITIVITY: Troponin T High Sensitivity: 27 ng/L — ABNORMAL HIGH (ref 0–19)

## 2024-06-05 LAB — PRO BRAIN NATRIURETIC PEPTIDE: Pro Brain Natriuretic Peptide: 538 pg/mL — ABNORMAL HIGH

## 2024-06-05 LAB — D-DIMER, QUANTITATIVE: D-Dimer, Quant: 0.84 ug{FEU}/mL — ABNORMAL HIGH (ref 0.00–0.50)

## 2024-06-05 MED ORDER — IOHEXOL 350 MG/ML SOLN
75.0000 mL | Freq: Once | INTRAVENOUS | Status: AC | PRN
  Administered 2024-06-05: 75 mL via INTRAVENOUS

## 2024-06-05 MED ORDER — METHYLPREDNISOLONE SODIUM SUCC 125 MG IJ SOLR
125.0000 mg | Freq: Once | INTRAMUSCULAR | Status: AC
  Administered 2024-06-05: 125 mg via INTRAVENOUS
  Filled 2024-06-05: qty 2

## 2024-06-05 MED ORDER — IPRATROPIUM-ALBUTEROL 0.5-2.5 (3) MG/3ML IN SOLN
3.0000 mL | Freq: Once | RESPIRATORY_TRACT | Status: AC
  Administered 2024-06-05: 3 mL via RESPIRATORY_TRACT
  Filled 2024-06-05: qty 3

## 2024-06-05 MED ORDER — PREDNISONE 20 MG PO TABS: 40.0000 mg | ORAL_TABLET | Freq: Every day | ORAL | 0 refills | Status: AC

## 2024-06-05 MED ORDER — DOXYCYCLINE HYCLATE 100 MG PO CAPS: 100.0000 mg | ORAL_CAPSULE | Freq: Two times a day (BID) | ORAL | 0 refills | Status: AC

## 2024-06-05 NOTE — ED Notes (Signed)
 RN informed by IV team the patient refused to be stuck. PA made aware.

## 2024-06-05 NOTE — ED Provider Notes (Signed)
 "  EMERGENCY DEPARTMENT AT [facility] Provider Note   CSN: [id number] Arrival date & time: 06/05/24  1034     Patient presents with: Shortness of Breath   Jeremy [name] is a 68 y.o. male with a history of atrial fibrillation, CHF, and diabetes, presents to the ED via EMS with shortness of breath that began 3 to 4 weeks ago. The symptoms started as as a productive cough and has progressed into shortness of breath upon ambulation.  Patient denies any fevers.  Patient denies any chest pain or shortness of breath at rest.  Patient denies any nausea, vomiting, diarrhea, or abdominal pain. No recent travel. No sick contacts. The patients social history is notable for current everyday smoker. The patient is homeless and currently non-complaint with all medications. The patient states that he is hungry and requests a meal before he will allow laboratory studies - the patient is in no acute distress.    Shortness of Breath Associated symptoms: cough        Prior to Admission medications  Medication Sig Start Date End Date Taking? Authorizing Provider  albuterol  (VENTOLIN  HFA) 108 (90 Base) MCG/ACT inhaler Inhale 2 puffs into the lungs every 6 (six) hours as needed for wheezing or shortness of breath. 05/21/24   Kennyth Domino, FNP  amiodarone  (PACERONE ) 200 MG tablet Take 1 tablet (200 mg total) by mouth daily. 05/21/24 06/20/24  Kennyth Domino, FNP  atorvastatin  (LIPITOR) 40 MG tablet Take 1 tablet (40 mg total) by mouth daily. 05/21/24   Kennyth Domino, FNP  carvedilol  (COREG ) 6.25 MG tablet Take 1 tablet (6.25 mg total) by mouth 2 (two) times daily. 05/21/24   Kennyth Domino, FNP  feeding supplement (ENSURE PLUS HIGH PROTEIN) LIQD Take 237 mLs by mouth 2 (two) times daily between meals. 04/27/24   Swayze, Ava, DO  metFORMIN  (GLUCOPHAGE ) 1000 MG tablet Take 1 tablet (1,000 mg total) by mouth daily with breakfast. 05/21/24 06/20/24  Kennyth Domino, FNP  nitroGLYCERIN  (NITROSTAT ) 0.4 MG  SL tablet Place 1 tablet (0.4 mg total) under the tongue every 5 (five) minutes as needed for chest pain up to 3 doses. 05/21/24   Kennyth Domino, FNP  rivaroxaban  (XARELTO ) 20 MG TABS tablet Take 1 tablet (20 mg total) by mouth daily at 5 pm 05/21/24 06/20/24  Kennyth Domino, FNP  sacubitril -valsartan  (ENTRESTO ) 24-26 MG Take 1 tablet by mouth 2 (two) times daily. 05/21/24   Kennyth Domino, FNP  sertraline  (ZOLOFT ) 50 MG tablet Take 1 tablet (50 mg total) by mouth daily. 05/21/24   Kennyth Domino, FNP  torsemide  (DEMADEX ) 20 MG tablet Take 1 tablet (20 mg total) by mouth daily. 05/21/24   Kennyth Domino, FNP    Allergies: Patient has no known allergies.    Review of Systems  Respiratory:  Positive for cough and shortness of breath.     Updated Vital Signs BP (!) 143/84   Pulse (!) 114   Temp 98.8 F (37.1 C) (Oral)   Resp 20   SpO2 100%   Physical Exam Vitals and nursing note reviewed.  Constitutional:      General: He is not in acute distress.    Appearance: Normal appearance.  HENT:     Head: Normocephalic and atraumatic.  Eyes:     Extraocular Movements: Extraocular movements intact.     Conjunctiva/sclera: Conjunctivae normal.     Pupils: Pupils are equal, round, and reactive to light.  Cardiovascular:     Rate and Rhythm: Tachycardia present. Rhythm  irregularly irregular.     Pulses: Normal pulses.          Radial pulses are 2+ on the right side and 2+ on the left side.  Pulmonary:     Effort: Pulmonary effort is normal. No respiratory distress.     Breath sounds: Examination of the right-middle field reveals rhonchi. Examination of the left-middle field reveals rhonchi. Rhonchi present. No wheezing.     Comments: Patient has no difficulty speaking complete sentences. Abdominal:     General: Abdomen is flat. There is distension.     Palpations: Abdomen is soft.     Tenderness: There is no abdominal tenderness. There is no right CVA tenderness, left CVA tenderness, guarding or  rebound.     Comments: Abdomen is non-tender and is notably distended - patient states this is his normal on exam.   Musculoskeletal:        General: Normal range of motion.     Cervical back: Normal range of motion.     Right lower leg: 2+ Pitting Edema present.     Left lower leg: 2+ Pitting Edema present.  Skin:    General: Skin is warm and dry.     Capillary Refill: Capillary refill takes less than 2 seconds.  Neurological:     General: No focal deficit present.     Mental Status: He is alert. Mental status is at baseline.     Comments: Patient alert and oriented.  Speech clear and appropriate.  No aphasia or dysarthria.   Cranial nerves III through XII intact: Motor strength 5-5 in all extremities with normal tone and no pronator drift.   Sensation intact to light touch in upper and lower extremities bilaterally.  No focal neurologic deficits appreciated.   Psychiatric:        Mood and Affect: Mood normal.     (all labs ordered are listed, but only abnormal results are displayed) Labs Reviewed  RESP PANEL BY RT-PCR (RSV, FLU A&B, COVID)  [id number]  BASIC METABOLIC PANEL WITH GFR  CBC  D-DIMER, QUANTITATIVE  PRO BRAIN NATRIURETIC PEPTIDE    EKG: EKG Interpretation Date/Time:  Tuesday June 05 2024 10:42:10 EST Ventricular Rate:  116 PR Interval:    QRS Duration:  101 QT Interval:  312 QTC Calculation: 434 R Axis:   7  Text Interpretation: Atrial flutter with predominant 2:1 AV block Repol abnrm, severe global ischemia (LM/MVD) Confirmed by Neysa Clap [phone]) on 06/05/2024 3:28:59 PM  Radiology: DG Chest 2 View Result Date: 06/05/2024 CLINICAL DATA:  Shortness of breath. EXAM: CHEST - 2 VIEW COMPARISON:  April 25, 2024 FINDINGS: Multiple sternal wires and vascular clips are seen. The cardiac silhouette is mildly enlarged and unchanged in size. There is mild prominence of the central pulmonary vasculature. Mildly prominent increased perihilar lung markings are  seen. No focal consolidation, pleural effusion or pneumothorax is identified. The visualized skeletal structures are unremarkable. IMPRESSION: 1. Evidence of prior median sternotomy/CABG. 2. Mild cardiomegaly with mild central pulmonary vascular congestion. Electronically Signed   By: Suzen Dials M.D.   On: 06/05/2024 15:11     Procedures   Medications Ordered in the ED - No data to display                                Medical Decision Making Amount and/or Complexity of Data Reviewed Labs: ordered. Decision-making details documented in ED Course. Radiology: ordered. ECG/medicine tests:  Decision-making details documented in ED Course.   Patient presents to the ED for: Shortness of breath This involves an extensive number of treatment options and is a complaint that carries with it a high risk of complications  Differential diagnosis includes: Infectious etiology  CHF exacerbation  PE Metabolic etiology  Other cardiopulmonary etiology Co-morbid conditions: Everyday smoker, atrial fibrillation, CHF, diabetes, hypertension  Additional history/records obtained and reviewed: Additional history obtained from  outside medical records External records from outside source obtained and reviewed including patients current medication regimen for which he is non-complaint.  Prior imaging and laboratory studies reviewed.  Clinical Course as of 06/05/24 1614  Tue Jun 05, 2024  1137 Temp: 98.8 F (37.1 C) afebrile, vital stable, patient in no acute distress. [ML]  1224 EKG 12-Lead Atrial flutter -  [ML]  1424 Patient refusing all laboratory studies a this time - to review PCR panel and CXR and reassess  [ML]  1453 Resp panel by RT-PCR (RSV, Flu A&B, Covid) Anterior Nasal Swab Negative [ML]  1517 Cardiomegaly, mild central pulmonary vascular congestion [ML]    Clinical Course User Index [ML] Willma Duwaine CROME, PA    Data Reviewed / Actions Taken: Imaging ordered/reviewed with my  independent interpretation in ED course above. I agree with the radiologists interpretation.  EKG ordered/reviewed with my independent interpretation in ED course above. The patient was not kept on continuous cardiac monitoring during the ED stay.  Test Considered/Diagnostic tools:  Additional diagnostic testing such as further laboratory studies and imaging were considered based on the patients presenting symptoms, risk factors, and initial clinical assessment - patient was asked multiple times to draw his blood for laboratory studies and he refused. Patient was educated on the importance of further investigating his shortness of breath, and patient finally agreed to allow RN staff to draw his blood after I spoke with him in length.   ED Course / Reassessments: Problem List: Shortness of breath 68 year old male presented for shortness of breath. Initial assessment included history, physical exam, and review of prior medical records.  Patient's workup for shortness of breath was inconclusive due to negative PCR testing and chest x-ray per his baseline when viewing prior imaging. Patient refused laboratory studies multiple times as he did not want his blood drawn. I spoke with him in length about the importance of further investigating his symptoms and he stated he would allow RN staff to draw his blood after lengthy conversation/education. Vital signs were obtained and monitored, and the patient remained stable throughout the stay.   Social determinants impacting care: homelessness   Disposition: Disposition: Discharge pending - handoff to provider Aberman PA for further evaluation and disposition decision after pending laboratory values and reassessment.   This note was produced using Electronics Engineer. While I have reviewed and verified all clinical information, transcription errors may remain.      Final diagnoses:  None    ED Discharge Orders     None           Willma Duwaine CROME, [place] 06/05/24 1615    Neysa Caron PARAS, [place] 06/06/24 [id number]  "

## 2024-06-05 NOTE — ED Notes (Signed)
Patient refuses lab draw

## 2024-06-05 NOTE — Discharge Instructions (Addendum)
 It was a pleasure taking care of you today. As discussed, your work-up was reassuring. No evidence of blood clot. I am sending you home with antibiotics and steroids. Take as prescribed and finish all antibiotics. Return to the ER for new or worsening symptoms.

## 2024-06-05 NOTE — ED Triage Notes (Signed)
 Pt arrives via [facility] c/o SOB. Pt states this has worsened over the last several days. Also c/o productive cough. Denies CP.

## 2024-06-05 NOTE — ED Notes (Signed)
 Patient refusing blood draw

## 2024-06-05 NOTE — ED Provider Notes (Signed)
 Air assumed from Jeremy Floyd, PA-C at shift change pending labs.  See her note for full HPI.  In short, patient is a 68 year old male who presents to the ED due to shortness of breath for the past 3 to 4 weeks associated with productive cough.  At shift change, all labs pending. Patient refused labs earlier and now agreeable to labs (troponin, cbc, bmp, d-dimer, bnp) Physical Exam  BP (!) 134/93 (BP Location: Left Arm)   Pulse 74   Temp 97.6 F (36.4 C) (Oral)   Resp 17   SpO2 96%   Physical Exam Vitals and nursing note reviewed.  Constitutional:      General: He is not in acute distress.    Appearance: He is not ill-appearing.  HENT:     Head: Normocephalic.  Eyes:     Pupils: Pupils are equal, round, and reactive to light.  Cardiovascular:     Rate and Rhythm: Normal rate and regular rhythm.     Pulses: Normal pulses.     Heart sounds: Normal heart sounds. No murmur heard.    No friction rub. No gallop.  Pulmonary:     Effort: Pulmonary effort is normal.     Breath sounds: Normal breath sounds.     Comments: Decreased air movement Abdominal:     General: Abdomen is flat. There is no distension.     Palpations: Abdomen is soft.     Tenderness: There is no abdominal tenderness. There is no guarding or rebound.  Musculoskeletal:        General: Normal range of motion.     Cervical back: Neck supple.     Comments: No lower extremity edema  Skin:    General: Skin is warm and dry.  Neurological:     General: No focal deficit present.     Mental Status: He is alert.  Psychiatric:        Mood and Affect: Mood normal.        Behavior: Behavior normal.     Procedures  Procedures  ED Course / MDM   Clinical Course as of 06/06/24 0038  Tue Jun 05, 2024  1137 Temp: 98.8 F (37.1 C) afebrile, vital stable, patient in no acute distress. [ML]  1224 EKG 12-Lead Atrial flutter -  [ML]  1424 Patient refusing all laboratory studies a this time - to review PCR panel and CXR  and reassess  [ML]  1453 Resp panel by RT-PCR (RSV, Flu A&B, Covid) Anterior Nasal Swab Negative [ML]  1517 Cardiomegaly, mild central pulmonary vascular congestion [ML]  1753 Pulse Rate: 90 [CA]  2115 D-Dimer, Quant(!): 0.84 [CA]  2144 Pro Brain Natriuretic Peptide(!): 538.0 [CA]  2144 Troponin T High Sensitivity(!): 27 [CA]  2144 D-Dimer, Quant(!): 0.84 [CA]    Clinical Course User Index [CA] Jeremy Aleck BROCKS, PA-C [ML] Willma Duwaine CROME, [place]   Medical Decision Making Amount and/or Complexity of Data Reviewed External Data Reviewed: notes. Labs: ordered. Decision-making details documented in ED Course. Radiology: ordered and independent interpretation performed. Decision-making details documented in ED Course. ECG/medicine tests: ordered and independent interpretation performed. Decision-making details documented in ED Course.  Risk Prescription drug management.   68 year old male presents due to shortness of breath x 3-4 weeks. See previous provider MDM for full details.   4:47 PM Reassessed patient at bedside. Placed order for cardiac monitoring given patient has been tachycardic.  Reviewed EKG which appears to be a flutter versus A-fib.  Patient does have a  history of A-fib.  Awaiting labs.  Patient denies chest pain.  Chart review, patient does have a history of COPD.  On exam patient does not have a wheeze.  Does have slight decreased air movement.  Added Solu-Medrol  and DuoNeb treatment.  8:31 PM Unfortunately there was a major delay in obtaining labs because patient kept refusing. IV team reported to bedside and refused again. Reported to bedside with RN and was able to get an IV on patient. Blood work sent.   CBC unremarkable.  No leukocytosis.  Normal hemoglobin.  BMP with elevated creatinine 1.45 which appears to be around patient's baseline.  BNP mildly elevated at 538.  Patient does not appear to be fluid overloaded on exam so we will hold off on IV Lasix .  Troponin mildly  elevated 27 likely due to demand ischemia. D-dimer elevated. CTA negative for PE.  Does demonstrate cardiomegaly consistent with pulmonary arterial hypertension and mild emphysema with bronchial thickening.  Patient able to ambulate and maintain O2 saturation above 95% without difficulty.  Patient discharged with prednisone  and Doxycycline . Suspect possible COPD exacerbation. No signs of respiratory distress. Patient stable for discharge. Strict ED precautions discussed with patient. Patient states understanding and agrees to plan. Patient discharged home in no acute distress and stable vitals  Co morbidities that complicate the patient evaluation  DM, A. Fib Cardiac Monitoring: / EKG:  The patient was maintained on a cardiac monitor.  I personally viewed and interpreted the cardiac monitored which showed an underlying rhythm of: A. Fib vs. A. Flutter  Social Determinants of Health:  Elderly >65  Test / Admission - Considered:  Considered admission however, workup reassuring.  No PE.  No evidence of respiratory distress on exam.  Normal O2 saturation during patient's entire ED stay.  Feel patient is stable for outpatient treatment for COPD exacerbation.  Discussed with Dr. Bernard who evaluated patient at bedside and agrees with assessment and plan.      Jeremy Aleck BROCKS, PA-C 06/06/24 JOYICE    Bernard Drivers, MD 06/09/24 [phone]

## 2024-06-05 NOTE — ED Notes (Signed)
 Pt ambulates slowly but well and with out assistance oxygen drops slightly but remains with in normal limits. Pt states he is hungry and wants some more sandwiches.

## 2024-06-06 LAB — TROPONIN T, HIGH SENSITIVITY: Troponin T High Sensitivity: 25 ng/L — ABNORMAL HIGH (ref 0–19)

## 2024-06-20 ENCOUNTER — Emergency Department (HOSPITAL_COMMUNITY)

## 2024-06-20 ENCOUNTER — Emergency Department (HOSPITAL_COMMUNITY)
Admission: EM | Admit: 2024-06-20 | Discharge: 2024-06-20 | Disposition: A | Discharge status: Home / Self Care | Attending: Emergency Medicine | Admitting: Emergency Medicine

## 2024-06-20 DIAGNOSIS — R0789 Other chest pain: Secondary | ICD-10-CM | POA: Insufficient documentation

## 2024-06-20 DIAGNOSIS — I5020 Unspecified systolic (congestive) heart failure: Secondary | ICD-10-CM | POA: Diagnosis not present

## 2024-06-20 DIAGNOSIS — R519 Headache, unspecified: Secondary | ICD-10-CM | POA: Insufficient documentation

## 2024-06-20 DIAGNOSIS — R059 Cough, unspecified: Secondary | ICD-10-CM | POA: Diagnosis not present

## 2024-06-20 DIAGNOSIS — R799 Abnormal finding of blood chemistry, unspecified: Secondary | ICD-10-CM | POA: Diagnosis not present

## 2024-06-20 DIAGNOSIS — M549 Dorsalgia, unspecified: Secondary | ICD-10-CM | POA: Insufficient documentation

## 2024-06-20 DIAGNOSIS — R0602 Shortness of breath: Secondary | ICD-10-CM | POA: Diagnosis not present

## 2024-06-20 DIAGNOSIS — Z7901 Long term (current) use of anticoagulants: Secondary | ICD-10-CM | POA: Diagnosis not present

## 2024-06-20 DIAGNOSIS — F17200 Nicotine dependence, unspecified, uncomplicated: Secondary | ICD-10-CM | POA: Diagnosis not present

## 2024-06-20 DIAGNOSIS — Z955 Presence of coronary angioplasty implant and graft: Secondary | ICD-10-CM | POA: Diagnosis not present

## 2024-06-20 DIAGNOSIS — R002 Palpitations: Secondary | ICD-10-CM | POA: Diagnosis present

## 2024-06-20 LAB — CBC WITH DIFFERENTIAL/PLATELET
Abs Immature Granulocytes: 0.02 K/uL (ref 0.00–0.07)
Basophils Absolute: 0.1 K/uL (ref 0.0–0.1)
Basophils Relative: 1 %
Eosinophils Absolute: 0.2 K/uL (ref 0.0–0.5)
Eosinophils Relative: 2 %
HCT: 46.4 % (ref 39.0–52.0)
Hemoglobin: 15.4 g/dL (ref 13.0–17.0)
Immature Granulocytes: 0 %
Lymphocytes Relative: 47 %
Lymphs Abs: 3.1 K/uL (ref 0.7–4.0)
MCH: 31.8 pg (ref 26.0–34.0)
MCHC: 33.2 g/dL (ref 30.0–36.0)
MCV: 95.7 fL (ref 80.0–100.0)
Monocytes Absolute: 0.6 K/uL (ref 0.1–1.0)
Monocytes Relative: 9 %
Neutro Abs: 2.7 K/uL (ref 1.7–7.7)
Neutrophils Relative %: 41 %
Platelets: 289 K/uL (ref 150–400)
RBC: 4.85 MIL/uL (ref 4.22–5.81)
RDW: 14.7 % (ref 11.5–15.5)
WBC: 6.6 K/uL (ref 4.0–10.5)
nRBC: 0 % (ref 0.0–0.2)

## 2024-06-20 LAB — CBG MONITORING, ED: Glucose-Capillary: 112 mg/dL — ABNORMAL HIGH (ref 70–99)

## 2024-06-20 LAB — COMPREHENSIVE METABOLIC PANEL WITH GFR
ALT: 13 U/L (ref 0–44)
AST: 31 U/L (ref 15–41)
Albumin: 4.2 g/dL (ref 3.5–5.0)
Alkaline Phosphatase: 72 U/L (ref 38–126)
Anion gap: 14 (ref 5–15)
BUN: 13 mg/dL (ref 8–23)
CO2: 19 mmol/L — ABNORMAL LOW (ref 22–32)
Calcium: 9.8 mg/dL (ref 8.9–10.3)
Chloride: 107 mmol/L (ref 98–111)
Creatinine, Ser: 1.5 mg/dL — ABNORMAL HIGH (ref 0.61–1.24)
GFR, Estimated: 51 mL/min — ABNORMAL LOW
Glucose, Bld: 102 mg/dL — ABNORMAL HIGH (ref 70–99)
Potassium: 4.9 mmol/L (ref 3.5–5.1)
Sodium: 140 mmol/L (ref 135–145)
Total Bilirubin: 0.6 mg/dL (ref 0.0–1.2)
Total Protein: 8.1 g/dL (ref 6.5–8.1)

## 2024-06-20 LAB — TROPONIN T, HIGH SENSITIVITY: Troponin T High Sensitivity: 27 ng/L — ABNORMAL HIGH (ref 0–19)

## 2024-06-20 MED ORDER — GUAIFENESIN ER 600 MG PO TB12: 600.0000 mg | ORAL_TABLET | Freq: Two times a day (BID) | ORAL | 0 refills | Status: DC

## 2024-06-20 MED ORDER — GUAIFENESIN ER 600 MG PO TB12
600.0000 mg | ORAL_TABLET | Freq: Two times a day (BID) | ORAL | 0 refills | Status: AC
  Filled 2024-06-20 – 2024-06-21 (×2): qty 28, 14d supply, fill #0

## 2024-06-20 MED ORDER — ALBUTEROL SULFATE HFA 108 (90 BASE) MCG/ACT IN AERS
2.0000 | INHALATION_SPRAY | RESPIRATORY_TRACT | Status: DC | PRN
  Administered 2024-06-20: 2 via RESPIRATORY_TRACT
  Filled 2024-06-20: qty 6.7

## 2024-06-20 MED ORDER — ACETAMINOPHEN 325 MG PO TABS
650.0000 mg | ORAL_TABLET | Freq: Once | ORAL | Status: AC
  Administered 2024-06-20: 650 mg via ORAL
  Filled 2024-06-20: qty 2
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
## 2024-06-20 NOTE — Discharge Instructions (Addendum)
 Please see the attached resources for information on shelters, social services, financial assistance, transportation.  Please return back to the emergency department if you have worsening chest pain or pressure, shortness of breath, difficulty breathing when lying down, significant increase in swelling in your legs, dizziness/lightheadedness, confusion, weakness, numbness, changes in vision, severe headache. I have also prescribed mucinex  for you to take, this will help break up the mucus causing some of your congestion. You can also pick it up over the counter.

## 2024-06-20 NOTE — ED Notes (Signed)
 First troponin and other labs collected by this writer at 1542.

## 2024-06-20 NOTE — ED Notes (Signed)
 Pt given sandwich bag, 4 cranberry juices with ice, a cup of regular coffee, 5 packs of sugar, and 5 cream.

## 2024-06-20 NOTE — ED Provider Triage Note (Signed)
 Emergency Medicine Provider Triage Evaluation Note  Jeremy [name] , a 69 y.o. male  was evaluated in triage.  Pt complains of palpitations, headache.  Review of Systems  Positive: Palpitations, headache Negative: Vision changes, chest pain, nausea, vomiting, numbness, weakness  Physical Exam  BP 139/78   Pulse 62   Temp 98 F (36.7 C) (Oral)   Resp 18   SpO2 99%  Gen:   Awake, no distress   Resp:  Normal effort  MSK:   Moves extremities without difficulty  Other:    Medical Decision Making  Medically screening exam initiated at 1:00 PM.  Appropriate orders placed.  Jeremy [name] was informed that the remainder of the evaluation will be completed by another provider, this initial triage assessment does not replace that evaluation, and the importance of remaining in the ED until their evaluation is complete.  Labs and imaging ordered   Jeremy [name] 06/20/24 1301
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
## 2024-06-21 ENCOUNTER — Other Ambulatory Visit (HOSPITAL_COMMUNITY): Payer: Self-pay

## 2024-06-21 ENCOUNTER — Other Ambulatory Visit: Payer: Self-pay

## 2024-06-21 NOTE — Progress Notes (Addendum)
 The patient presented for a primary care visit on 05/21/2024. His bp was 137/77. During the appointment, the patient did not do the SDOH screener.   A review of the patient's chart rindicates that he has a PCP (Dr. Tamra Leventhal [facility]) and his most recent appt with them was on 01/11/2024. He has two upcoming appts with his PCP on 08/29/2024 for labs and 09/05/2024 for an appt. Chart review shows that he has a food, housing, and transportation SDOH need. He has history as a smoker as well. He has Medicare and Medicaid for his insurance as well according to chart review.   Pt is currently obtaining support through the [facility] and caseworkers at the [facility] for continued SDOH support. Pt was recently seen by Congregational Nurse Mitzie Breen, RN on 05/29/2024. Pt does not have any contact information to be able to reach pt for f/u.   At this time, no additional support from the [facility] team is necessary.

## 2024-06-26 ENCOUNTER — Telehealth: Admitting: Nurse Practitioner

## 2024-06-26 ENCOUNTER — Other Ambulatory Visit: Payer: Self-pay

## 2024-06-26 DIAGNOSIS — I4811 Longstanding persistent atrial fibrillation: Secondary | ICD-10-CM

## 2024-06-26 DIAGNOSIS — E119 Type 2 diabetes mellitus without complications: Secondary | ICD-10-CM | POA: Diagnosis not present

## 2024-06-26 DIAGNOSIS — J449 Chronic obstructive pulmonary disease, unspecified: Secondary | ICD-10-CM | POA: Diagnosis not present

## 2024-06-26 DIAGNOSIS — I509 Heart failure, unspecified: Secondary | ICD-10-CM

## 2024-06-26 DIAGNOSIS — E782 Mixed hyperlipidemia: Secondary | ICD-10-CM

## 2024-06-26 DIAGNOSIS — F339 Major depressive disorder, recurrent, unspecified: Secondary | ICD-10-CM | POA: Diagnosis not present

## 2024-06-26 MED ORDER — AMIODARONE HCL 200 MG PO TABS
200.0000 mg | ORAL_TABLET | Freq: Every day | ORAL | 0 refills | Status: AC
  Filled 2024-06-26: qty 30, 30d supply, fill #0

## 2024-06-26 MED ORDER — ALBUTEROL SULFATE HFA 108 (90 BASE) MCG/ACT IN AERS
2.0000 | INHALATION_SPRAY | Freq: Four times a day (QID) | RESPIRATORY_TRACT | 0 refills | Status: AC | PRN
  Filled 2024-06-26: qty 6.7, 25d supply, fill #0

## 2024-06-26 MED ORDER — ENSURE PLUS HIGH PROTEIN PO LIQD
237.0000 mL | Freq: Two times a day (BID) | ORAL | 0 refills | Status: AC
  Filled 2024-06-26: qty 237, 1d supply, fill #0

## 2024-06-26 MED ORDER — CARVEDILOL 6.25 MG PO TABS
6.2500 mg | ORAL_TABLET | Freq: Two times a day (BID) | ORAL | 0 refills | Status: AC
  Filled 2024-06-26: qty 30, 15d supply, fill #0

## 2024-06-26 MED ORDER — SERTRALINE HCL 50 MG PO TABS
50.0000 mg | ORAL_TABLET | Freq: Every day | ORAL | 0 refills | Status: AC
  Filled 2024-06-26: qty 30, 30d supply, fill #0

## 2024-06-26 MED ORDER — SACUBITRIL-VALSARTAN 24-26 MG PO TABS
1.0000 | ORAL_TABLET | Freq: Two times a day (BID) | ORAL | 0 refills | Status: AC
  Filled 2024-06-26: qty 30, 15d supply, fill #0

## 2024-06-26 MED ORDER — METFORMIN HCL 1000 MG PO TABS
1000.0000 mg | ORAL_TABLET | Freq: Every day | ORAL | 0 refills | Status: AC
  Filled 2024-06-26: qty 30, 30d supply, fill #0

## 2024-06-26 MED ORDER — ATORVASTATIN CALCIUM 40 MG PO TABS
40.0000 mg | ORAL_TABLET | Freq: Every day | ORAL | 0 refills | Status: AC
  Filled 2024-06-26: qty 30, 30d supply, fill #0

## 2024-06-26 MED ORDER — RIVAROXABAN 20 MG PO TABS
20.0000 mg | ORAL_TABLET | Freq: Every day | ORAL | 0 refills | Status: AC
  Filled 2024-06-26: qty 30, 30d supply, fill #0

## 2024-06-26 NOTE — [facility]regational Nurse Program (Signed)
" °  Dept: [phone]   Congregational Nurse Program Note  Date of Encounter: 06/26/2024  Past Medical History: Past Medical History:  Diagnosis Date   Atrial fibrillation, chronic (HCC)    CHF (congestive heart failure) (HCC)    Coronary artery disease    Diabetes mellitus without complication (HCC)    Dyslipidemia 08/07/2023   GERD without esophagitis 08/07/2023   Hypertension    OSA (obstructive sleep apnea) 11/24/2022   S/P CABG x 1     Encounter Details:  Community Questionnaire - 06/26/24 1210       Questionnaire   Ask client: Do you give verbal consent for me to treat you today? Yes    Student Assistance N/A    Location Patient Served  [facility]    Encounter Setting CN site    Population Status Unhoused    Insurance Medicaid;Medicare    Insurance/Financial Assistance Referral N/A    Medication Have Medication Insecurities;Patient Medications Reviewed;CCNP Provided Medication Assistance    Medical Provider Yes    Medical Referrals Made [facility] or [facility]    Medical Appointment Completed [facility] or [facility]    Screenings CN Performed (remember to also record results) NA    CNP Interventions Diabetes Education;Hypertension Education;Other Education;Advocate/Support;Navigate Healthcare System;Case Management    ED Visit Averted Yes          RN saw client at [facility]. RN asked client if he has been taking medications, client states he does not have any. RN reached out to [name], NP for visit to refill meds and follow up from ED. RN assisted with video call. NP also discussed housing options for client and if he is interested in an assisted living. Client unsure at this time. RN reaching out to Odessia Pacer [phone] for possible housing options for older clients. RN able to provide spiritual and emotional support.  "

## 2024-06-26 NOTE — Progress Notes (Signed)
 "  Acute Video Visit    Virtual Visit Consent:   Jeremy [name], you are scheduled for a virtual visit with a [facility] provider today.     Just as with appointments in the office, your consent must be obtained to participate.  Your consent will be active for this visit and any virtual visit you may have with one of our providers in the next 365 days.     If you have a MyChart account, a copy of this consent can be sent to you electronically.  All virtual visits are billed to your insurance company just like a traditional visit in the office.    If the connection with a video visit is poor, the visit may have to be switched to a telephone visit.  With either a video or telephone visit, we are not always able to ensure that we have a secure connection.     I need to obtain your verbal consent now.   Are you willing to proceed with your visit today?    Leman Martinek has provided verbal consent on 06/26/2024 for a virtual visit (video or telephone).   [name], FNP  Date: 06/26/2024 12:09 PM  Subjective:     Patient ID: Jeremy [name], male    DOB: October 05, 1955, 69 y.o.   MRN: [id number]  LILLETTE [name], connected with  Jeremy [name]  ([id number], 1955/10/10) on 06/26/2024 at  2:45 PM EST by a video-enabled telemedicine application and verified that I am speaking with the correct person using two identifiers.   Location:  Patient: Home  [facility])  Provider: Virtual Visit Location Provider: Home Office   I discussed the limitations of evaluation and management by telemedicine and the availability of in person appointments. The patient expressed understanding and agreed to proceed.     HPI  Jeremy [name] is a 69 y.o. who identifies as a male who was assigned male at birth, and is being seen today for assistance in medication refills.   He was recently in the ED and since that time has been living at the [facility]. He has lost all of his medications and is having a  visit with the congregational nurse today that is trying to assist him with medication refills   He is feeling ok  in his current living situation. When in the ED he states he was feeling unsafe. He has lived in a nursing home in the past but does not wish to live in that environment anymore. He is interested in housing resources and feels he could cook for himself if he had a kitchen         Objective:    There were no vitals taken for this visit. BP Readings from Last 3 Encounters:  06/20/24 130/77  06/06/24 (!) 158/86  05/21/24 137/77      Physical Exam Constitutional:      General: He is not in acute distress.    Appearance: He is not ill-appearing.  HENT:     Nose: Nose normal.     Mouth/Throat:     Mouth: Mucous membranes are moist.  Pulmonary:     Effort: Pulmonary effort is normal.  Neurological:     Mental Status: He is alert and oriented to person, place, and time.  Psychiatric:        Mood and Affect: Mood normal.          Assessment & Plan:   Provided refills for patient CCN will organize picking  up medicine from [facility]  Will follow up regarding housing needs and in person evaluation as needed   1. Chronic obstructive pulmonary disease, unspecified COPD type (HCC) - albuterol  (VENTOLIN  HFA) 108 (90 Base) MCG/ACT inhaler; Inhale 2 puffs into the lungs every 6 (six) hours as needed for wheezing or shortness of breath.  Dispense: 6.7 g; Refill: 0 - feeding supplement (ENSURE PLUS HIGH PROTEIN) LIQD; Take 237 mLs by mouth 2 (two) times daily between meals.  Dispense: 237 mL; Refill: 0  2. Congestive heart failure, unspecified HF chronicity, unspecified heart failure type (HCC) - amiodarone  (PACERONE ) 200 MG tablet; Take 1 tablet (200 mg total) by mouth daily.  Dispense: 30 tablet; Refill: 0 - carvedilol  (COREG ) 6.25 MG tablet; Take 1 tablet (6.25 mg total) by mouth 2 (two) times daily.  Dispense: 30 tablet; Refill: 0 - feeding supplement (ENSURE PLUS  HIGH PROTEIN) LIQD; Take 237 mLs by mouth 2 (two) times daily between meals.  Dispense: 237 mL; Refill: 0 - sacubitril -valsartan  (ENTRESTO ) 24-26 MG; Take 1 tablet by mouth 2 (two) times daily.  Dispense: 30 tablet; Refill: 0  3. Longstanding persistent atrial fibrillation (HCC) - amiodarone  (PACERONE ) 200 MG tablet; Take 1 tablet (200 mg total) by mouth daily.  Dispense: 30 tablet; Refill: 0 - feeding supplement (ENSURE PLUS HIGH PROTEIN) LIQD; Take 237 mLs by mouth 2 (two) times daily between meals.  Dispense: 237 mL; Refill: 0 - rivaroxaban  (XARELTO ) 20 MG TABS tablet; Take 1 tablet (20 mg total) by mouth daily at 5 pm  Dispense: 30 tablet; Refill: 0  4. Mixed hyperlipidemia - atorvastatin  (LIPITOR) 40 MG tablet; Take 1 tablet (40 mg total) by mouth daily.  Dispense: 30 tablet; Refill: 0 - feeding supplement (ENSURE PLUS HIGH PROTEIN) LIQD; Take 237 mLs by mouth 2 (two) times daily between meals.  Dispense: 237 mL; Refill: 0  5. Diabetes mellitus without complication (HCC) - metFORMIN  (GLUCOPHAGE ) 1000 MG tablet; Take 1 tablet (1,000 mg total) by mouth daily with breakfast.  Dispense: 30 tablet; Refill: 0  6. Depression, recurrent - sertraline  (ZOLOFT ) 50 MG tablet; Take 1 tablet (50 mg total) by mouth daily.  Dispense: 30 tablet; Refill: 0    Follow Up Instructions: I discussed the assessment and treatment plan with the patient. The patient was provided an opportunity to ask questions and all were answered. The patient agreed with the plan and demonstrated an understanding of the instructions.  A copy of instructions were sent to the patient via MyChart unless otherwise noted below.    The patient was advised to call back or seek an in-person evaluation if the symptoms worsen or if the condition fails to improve as anticipated.    [name], FNP  **Disclaimer: This note may have been dictated with voice recognition software. Similar sounding words can inadvertently be transcribed  and this note may contain transcription errors which may not have been corrected upon publication of note.** "

## 2024-07-03 NOTE — Congregational Nurse Program (Signed)
" °  Dept: [phone]   Congregational Nurse Program Note  Date of Encounter: 07/03/2024  Past Medical History: Past Medical History:  Diagnosis Date   Atrial fibrillation, chronic (HCC)    CHF (congestive heart failure) (HCC)    Coronary artery disease    Diabetes mellitus without complication (HCC)    Dyslipidemia 08/07/2023   GERD without esophagitis 08/07/2023   Hypertension    OSA (obstructive sleep apnea) 11/24/2022   S/P CABG x 1     Encounter Details:  Community Questionnaire - 07/03/24 1215       Questionnaire   Ask client: Do you give verbal consent for me to treat you today? Yes    Student Assistance N/A    Location Patient Served  [facility]    Encounter Setting CN site    Population Status Unhoused    Insurance Medicaid;Medicare    Insurance/Financial Assistance Referral N/A    Medication Have Medication Insecurities;Patient Medications Reviewed;CCNP Provided Medication Assistance    Medical Provider Yes    Medical Referrals Made NA    Medical Appointment Completed [facility] or [facility]    Screenings CN Performed (remember to also record results) NA    CNP Interventions Diabetes Education;Hypertension Education;Other Education;Advocate/Support;Navigate Healthcare System;Case Management    ED Visit Averted N/A        RN met with client at [facility]. RN able to fill client's weekly container for medicine. All of  PM meds were taken. None of AM meds taken. RN able to explain again that one side of container is morning and other is evening. Client verbalized understanding. Client also requesting to reschedule dental appointment. RN made appointment with urgent tooth for 07/29/24 at 11:00 client to come at 10:30 to fill out papers. Unable to set up ride through [facility] for client. They can not find in him system. RN set up ride through [facility]. Ride #[phone] pick up time 9:25. Client to call for return ride.     "

## 2024-07-09 ENCOUNTER — Emergency Department (HOSPITAL_COMMUNITY)
Admission: EM | Admit: 2024-07-09 | Discharge: 2024-07-09 | Disposition: A | Discharge status: Home / Self Care | Attending: Emergency Medicine | Admitting: Emergency Medicine

## 2024-07-09 ENCOUNTER — Other Ambulatory Visit: Payer: Self-pay

## 2024-07-09 ENCOUNTER — Emergency Department (HOSPITAL_COMMUNITY)

## 2024-07-09 ENCOUNTER — Other Ambulatory Visit (HOSPITAL_COMMUNITY): Payer: Self-pay

## 2024-07-09 DIAGNOSIS — Z7901 Long term (current) use of anticoagulants: Secondary | ICD-10-CM | POA: Diagnosis not present

## 2024-07-09 DIAGNOSIS — G8929 Other chronic pain: Secondary | ICD-10-CM | POA: Diagnosis not present

## 2024-07-09 DIAGNOSIS — M25571 Pain in right ankle and joints of right foot: Secondary | ICD-10-CM | POA: Diagnosis present

## 2024-07-09 MED ORDER — NAPROXEN 250 MG PO TABS
500.0000 mg | ORAL_TABLET | Freq: Once | ORAL | Status: AC
  Administered 2024-07-09: 500 mg via ORAL
  Filled 2024-07-09: qty 2

## 2024-07-09 MED ORDER — NAPROXEN 500 MG PO TABS
500.0000 mg | ORAL_TABLET | Freq: Two times a day (BID) | ORAL | 0 refills | Status: AC
  Filled 2024-07-09 – 2024-07-11 (×2): qty 30, 15d supply, fill #0

## 2024-07-09 NOTE — ED Provider Triage Note (Signed)
 Emergency Medicine Provider Triage Evaluation Note  Jeremy [name] , a 69 y.o. male  was evaluated in triage.  Pt complains of pain in his ankle.  Patient had a broken ankle that had surgery fixed a few years ago.  He reports since that has been called it makes his ankle hurt worse he has had no recent falls.  He does have some skin breakdown noted on the right ankle.  Patient has good range of motion without pain.  Patient is stable in triage.  Review of Systems  Positive: Ankle pain Negative:   Physical Exam  BP 114/79 (BP Location: Right Arm)   Pulse (!) 116   Temp (!) 97.3 F (36.3 C) (Axillary)   Resp 20   SpO2 98%  Gen:   Awake, no distress   Resp:  Normal effort  MSK:   Moves extremities without difficulty  Other:    Medical Decision Making  Medically screening exam initiated at 1:48 PM.  Appropriate orders placed.  Jeremy [name] was informed that the remainder of the evaluation will be completed by another provider, this initial triage assessment does not replace that evaluation, and the importance of remaining in the ED until their evaluation is complete.     Jeremy [name], [place] 07/09/24 1349

## 2024-07-09 NOTE — ED Notes (Signed)
 Wound on patient's ankle cleaned, bacitracin  applied and band-aid applied to cover. Patient given extra bandaids and instructed to keep site clean and covered so it can heal. Patient also unable to provide urine specimen and instructed to return if he has burning or pain with urination.

## 2024-07-09 NOTE — ED Provider Notes (Signed)
 " [place] [facility] Provider Note   CSN: [id number] Arrival date & time: 07/09/24  1334     Patient presents with: Ankle Pain   Jeremy [name] is a 69 y.o. male.    Ankle Pain 69 year old male presenting to the ER with ankle pain.  Patient reports over the past few days he has noted increased pain in his right ankle.  He reports that he had a fall a few years ago and had surgery.  He has been seen in the ER for this pain previously.  Patient denies any other symptoms.     Prior to Admission medications  Medication Sig Start Date End Date Taking? Authorizing Provider  albuterol  (VENTOLIN  HFA) 108 (90 Base) MCG/ACT inhaler Inhale 2 puffs into the lungs every 6 (six) hours as needed for wheezing or shortness of breath. 06/26/24   Kennyth Domino, FNP  amiodarone  (PACERONE ) 200 MG tablet Take 1 tablet (200 mg total) by mouth daily. 06/26/24 07/26/24  Kennyth Domino, FNP  atorvastatin  (LIPITOR) 40 MG tablet Take 1 tablet (40 mg total) by mouth daily. 06/26/24   Kennyth Domino, FNP  carvedilol  (COREG ) 6.25 MG tablet Take 1 tablet (6.25 mg total) by mouth 2 (two) times daily. 06/26/24   Kennyth Domino, FNP  feeding supplement (ENSURE PLUS HIGH PROTEIN) LIQD Take 237 mLs by mouth 2 (two) times daily between meals. 06/26/24   Kennyth Domino, FNP  guaiFENesin  (MUCINEX ) 600 MG 12 hr tablet Take 1 tablet (600 mg total) by mouth 2 (two) times daily. 06/20/24   Rihner, Emilie, DO  metFORMIN  (GLUCOPHAGE ) 1000 MG tablet Take 1 tablet (1,000 mg total) by mouth daily with breakfast. 06/26/24 07/26/24  Kennyth Domino, FNP  nitroGLYCERIN  (NITROSTAT ) 0.4 MG SL tablet Place 1 tablet (0.4 mg total) under the tongue every 5 (five) minutes as needed for chest pain up to 3 doses. 05/21/24   Kennyth Domino, FNP  rivaroxaban  (XARELTO ) 20 MG TABS tablet Take 1 tablet (20 mg total) by mouth daily at 5 pm 06/26/24 07/26/24  Kennyth Domino, FNP  sacubitril -valsartan  (ENTRESTO ) 24-26 MG Take 1 tablet  by mouth 2 (two) times daily. 06/26/24   Kennyth Domino, FNP  sertraline  (ZOLOFT ) 50 MG tablet Take 1 tablet (50 mg total) by mouth daily. 06/26/24   Kennyth Domino, FNP  torsemide  (DEMADEX ) 20 MG tablet Take 1 tablet (20 mg total) by mouth daily. 05/21/24   Kennyth Domino, FNP    Allergies: Patient has no known allergies.    Review of Systems  All other systems reviewed and are negative.   Updated Vital Signs BP 114/79 (BP Location: Right Arm)   Pulse (!) 116   Temp (!) 97.3 F (36.3 C) (Axillary)   Resp 20   SpO2 98%   Physical Exam Vitals and nursing note reviewed.  Eyes:     General: No scleral icterus. Pulmonary:     Effort: Pulmonary effort is normal.  Musculoskeletal:     Right ankle: Swelling present. No deformity, ecchymosis or lacerations. Tenderness present. Normal range of motion. Normal pulse.     Left ankle: Normal.     Comments: Patient was able to move his ankle without any pain.  Small abrasion noted to the lateral side of the ankle.  No signs of infection or drainage noted.  Some swelling noted to the lower right ankle.  Sensation was intact.  Skin:    Coloration: Skin is not jaundiced.     Findings: No rash.  Neurological:  General: No focal deficit present.     Mental Status: He is alert.     (all labs ordered are listed, but only abnormal results are displayed) Labs Reviewed - No data to display  EKG: None  Radiology: DG Ankle Complete Right Result Date: 07/09/2024 EXAM: 3 OR MORE VIEW(S) XRAY OF THE ANKLE 07/09/2024 02:15:00 PM CLINICAL HISTORY: Pain. COMPARISON: Comparison with 02/27/2024. FINDINGS: BONES AND JOINTS: Lateral plate and screw fixation of the fibula is in place. 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 The plate is not flush with the fibula, and there is lucency around the plate and around some of the fixation screws suggesting loosening. Screw heads are not flush with the plate. The appearance of the fracture and hardware is  similar to the prior study. Calcaneal spurs. Ankylosis of the ankle joint. No malalignment. SOFT TISSUES: Soft tissue swelling. IMPRESSION: 1. 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 2. Soft tissue swelling. 3. Ankylosis of the ankle joint. Electronically signed by: Elsie Gravely MD 07/09/2024 03:27 PM EST RP Workstation: [id number]   DG Foot Complete Right Result Date: 07/09/2024 EXAM: 3 OR MORE VIEW(S) XRAY OF THE FOOT 07/09/2024 02:15:00 PM COMPARISON: Right ankle 02/27/2024. CLINICAL HISTORY: Pain. FINDINGS: BONES AND JOINTS: Status post plate and screw fixation of the distal fibula with nonunion of the distal fibula fracture and hardware loosening. Status post tibiotalar joint fusion. Plantar calcaneal spur. Moderate hallux valgus deformity. Degenerative changes in the intertarsal joints. No acute fracture. SOFT TISSUES: Unremarkable. IMPRESSION: 1. Status post plate and screw fixation of the distal fibula with nonunion of the distal fibula fracture and hardware loosening. 2. Degenerative changes in the intertarsal joints. 3. Status post tibiotalar joint fusion. 4. Moderate hallux valgus deformity. 5. Plantar calcaneal spur. Electronically signed by: Elsie Gravely MD 07/09/2024 03:23 PM EST RP Workstation: [id number]     Procedures   Medications Ordered in the ED - No data to display                                  Medical Decision Making Amount and/or Complexity of Data Reviewed Radiology: ordered.   Impression: 69 year old man presenting with foot pain.  Differential diagnoses include fracture, strain, dislocation, abrasion, ulcer  Additional History: Patient is able to provide history.  Also reviewed other outpatient notes.  Labs: None  Imaging: X-ray of the ankle and foot were performed and showed Chronic nonunited distal fibular fracture with lateral plate and screw   fixation; lucency around the plate and some screws suggests hardware loosening.  2. Soft tissue swelling.  3. Ankylosis of the ankle joint.  I reviewed the images and agree with radiology's interpretation.  ED Course/Meds: Patient is a 69 year old male presenting with right ankle pain.  Patient was well-appearing and in no acute distress.  Patient has been seen multiple times in the ER for this complaint.  In the past patient had a fracture of his right lower leg and received surgery.  Since then he has had some pain in the area where the surgery was performed.  X-ray showed no acute changes however did show that some of the screws may be loosening.  Patient was given naproxen  to help with his pain.  Patient reports after that he was feeling a little bit better.  Patient was then prescribed Proxen to help with his pain.  I also put a referral in for him to follow-up with orthopedic surgery outpatient.  Patient agreed to the plan stated above.  Is also noted that that he had a small abrasion to the lower left leg.  This was dressed by nursing staff.  I educated the patient that if that starts to get worse or he develops any fever or chills to please return to the ER.  Patient agreed to this.  Patient was given strict return precautions.  Patient remained stable while in the ER and at discharge.    Final diagnoses:  None    ED Discharge Orders     None          [name], [place] 07/09/24 1726  "

## 2024-07-09 NOTE — Discharge Instructions (Addendum)
 You were seen today in the ER for ankle pain.  I prescribed you some naproxen  that you may take for the pain.  I have also provided to a orthopedic follow-up on your discharge paperwork.  Give them a call tomorrow to set up an appointment to look at your ankle.  Follow-up with your primary care if this continues.  If your pain starts to worsen or you start to develop any fever, chills, new symptoms please return to the ER.

## 2024-07-09 NOTE — ED Notes (Signed)
 Patient states he has a pee problem and describes urinary frequency. Denies burning or pain with urination. Patient provided specimen cup to provide sample. Patient also requested sandwich and cranberry juice, also provided. Patient eating at this time.

## 2024-07-09 NOTE — ED Triage Notes (Signed)
 [facility] [place] [facility] for eval of R ankle pain related to hardware placed in previous procedure that is now causing him problems.

## 2024-07-10 NOTE — Congregational Nurse Program (Signed)
" °  Dept: [phone]   Congregational Nurse Program Note  Date of Encounter: 07/10/2024  Past Medical History: Past Medical History:  Diagnosis Date   Atrial fibrillation, chronic (HCC)    CHF (congestive heart failure) (HCC)    Coronary artery disease    Diabetes mellitus without complication (HCC)    Dyslipidemia 08/07/2023   GERD without esophagitis 08/07/2023   Hypertension    OSA (obstructive sleep apnea) 11/24/2022   S/P CABG x 1     Encounter Details:  Community Questionnaire - 07/07/24 1200       Questionnaire   Ask client: Do you give verbal consent for me to treat you today? Yes    Student Assistance N/A    Location Patient Served  [facility]    Encounter Setting CN site    Population Status Unhoused    Insurance Medicaid;Medicare    Insurance/Financial Assistance Referral N/A    Medication Have Medication Insecurities;Patient Medications Reviewed;CCNP Provided Medication Assistance    Medical Provider Yes    Medical Referrals Made NA    Medical Appointment Completed N/A    Screenings CN Performed (remember to also record results) NA    CN Interventions Diabetes Education;Hypertension Education;Other Education;Advocate/Support;Navigate Healthcare System;Case Management    ED Visit Averted N/A          RN visited with client at [facility]. RN able to fill medication container prior to incoming severe weather. Client had only two mornings and one evening dose missing. RN able to educate client on importance of medication compliance. Client verbalizes understanding. Client denies any other needs at this time.   "

## 2024-07-11 ENCOUNTER — Other Ambulatory Visit (HOSPITAL_COMMUNITY): Payer: Self-pay

## 2024-07-11 ENCOUNTER — Other Ambulatory Visit: Payer: Self-pay

## 2024-07-11 NOTE — Congregational Nurse Program (Signed)
" °  Dept: [phone]   Congregational Nurse Program Note  Date of Encounter: 07/11/2024  Past Medical History: Past Medical History:  Diagnosis Date   Atrial fibrillation, chronic (HCC)    CHF (congestive heart failure) (HCC)    Coronary artery disease    Diabetes mellitus without complication (HCC)    Dyslipidemia 08/07/2023   GERD without esophagitis 08/07/2023   Hypertension    OSA (obstructive sleep apnea) 11/24/2022   S/P CABG x 1     Encounter Details:  Community Questionnaire - 07/11/24 0945       Questionnaire   Ask client: Do you give verbal consent for me to treat you today? Yes    Student Assistance N/A    Location Patient Served  [facility]    Encounter Setting CN site    Population Status Unhoused    Insurance Medicaid;Medicare    Insurance/Financial Assistance Referral N/A    Medication Have Medication Insecurities;Patient Medications Reviewed;CCNP Provided Medication Assistance    Medical Provider Yes    Medical Referrals Made NA    Medical Appointment Completed N/A    Screenings CN Performed (remember to also record results) NA    CN Interventions Diabetes Education;Hypertension Education;Other Education;Advocate/Support;Navigate Healthcare System;Case Management    ED Visit Averted Yes         RN saw client today in his pallet at [facility]. RN questioned client about recent ER visit. Client showed RN abrasion on ankle. Area noted to have old exudate as well as fresh. No foul odor, client does admit to pain from it. RN scheduled client with his PCP tomorrow (1/29) at 9:45. RN arranged [employer] trip for client. ID # [id number] client to be picked up at 8:15. Client given number to call for return ride. RN called local medicaid to see which plan covers client for future appointments. RN has not yet received call back. RN able to schedule ortho appointment for client on 2/3 at 9:45.  RN called transitional pharmacy to seek clarification on RX prescribed for client.  RX transferred to [facility] location so RN can pick up. RN refilled clients med container. Client had only taken 2 days of morning meds and 2 days of evening meds. RN urged importance of taking medications as prescribed. Client denies any further needs at this time. RN able to provide emotional and spiritual support.  "

## 2024-07-12 ENCOUNTER — Other Ambulatory Visit: Payer: Self-pay

## 2024-07-12 ENCOUNTER — Emergency Department

## 2024-07-12 ENCOUNTER — Emergency Department
Admission: EM | Admit: 2024-07-12 | Discharge: 2024-07-12 | Disposition: A | Discharge status: Home / Self Care | Attending: Emergency Medicine | Admitting: Emergency Medicine

## 2024-07-12 DIAGNOSIS — S91001A Unspecified open wound, right ankle, initial encounter: Secondary | ICD-10-CM | POA: Insufficient documentation

## 2024-07-12 DIAGNOSIS — E119 Type 2 diabetes mellitus without complications: Secondary | ICD-10-CM | POA: Insufficient documentation

## 2024-07-12 DIAGNOSIS — I509 Heart failure, unspecified: Secondary | ICD-10-CM | POA: Insufficient documentation

## 2024-07-12 DIAGNOSIS — I110 Hypertensive heart disease with heart failure: Secondary | ICD-10-CM | POA: Insufficient documentation

## 2024-07-12 DIAGNOSIS — X58XXXA Exposure to other specified factors, initial encounter: Secondary | ICD-10-CM | POA: Diagnosis not present

## 2024-07-12 DIAGNOSIS — M25571 Pain in right ankle and joints of right foot: Secondary | ICD-10-CM | POA: Diagnosis present

## 2024-07-12 LAB — CBG MONITORING, ED: Glucose-Capillary: 85 mg/dL (ref 70–99)

## 2024-07-12 LAB — BASIC METABOLIC PANEL WITH GFR
Anion gap: 10 (ref 5–15)
BUN: 17 mg/dL (ref 8–23)
CO2: 20 mmol/L — ABNORMAL LOW (ref 22–32)
Calcium: 9.1 mg/dL (ref 8.9–10.3)
Chloride: 110 mmol/L (ref 98–111)
Creatinine, Ser: 1.36 mg/dL — ABNORMAL HIGH (ref 0.61–1.24)
GFR, Estimated: 57 mL/min — ABNORMAL LOW
Glucose, Bld: 82 mg/dL (ref 70–99)
Potassium: 4.8 mmol/L (ref 3.5–5.1)
Sodium: 140 mmol/L (ref 135–145)

## 2024-07-12 LAB — CBC WITH DIFFERENTIAL/PLATELET
Abs Immature Granulocytes: 0.01 10*3/uL (ref 0.00–0.07)
Basophils Absolute: 0.1 10*3/uL (ref 0.0–0.1)
Basophils Relative: 1 %
Eosinophils Absolute: 0.2 10*3/uL (ref 0.0–0.5)
Eosinophils Relative: 4 %
HCT: 46.5 % (ref 39.0–52.0)
Hemoglobin: 15 g/dL (ref 13.0–17.0)
Immature Granulocytes: 0 %
Lymphocytes Relative: 52 %
Lymphs Abs: 2.7 10*3/uL (ref 0.7–4.0)
MCH: 31.1 pg (ref 26.0–34.0)
MCHC: 32.3 g/dL (ref 30.0–36.0)
MCV: 96.3 fL (ref 80.0–100.0)
Monocytes Absolute: 0.5 10*3/uL (ref 0.1–1.0)
Monocytes Relative: 9 %
Neutro Abs: 1.8 10*3/uL (ref 1.7–7.7)
Neutrophils Relative %: 34 %
Platelets: 267 10*3/uL (ref 150–400)
RBC: 4.83 MIL/uL (ref 4.22–5.81)
RDW: 14.2 % (ref 11.5–15.5)
WBC: 5.2 10*3/uL (ref 4.0–10.5)
nRBC: 0 % (ref 0.0–0.2)

## 2024-07-12 LAB — C-REACTIVE PROTEIN: CRP: <0.5 mg/dL

## 2024-07-12 LAB — SEDIMENTATION RATE: Sed Rate: 42 mm/h — ABNORMAL HIGH (ref 0–20)

## 2024-07-12 MED ORDER — DOXYCYCLINE HYCLATE 100 MG PO TABS
100.0000 mg | ORAL_TABLET | Freq: Two times a day (BID) | ORAL | 0 refills | Status: AC
  Filled 2024-07-12 (×2): qty 14, 7d supply, fill #0

## 2024-07-12 MED ORDER — CEPHALEXIN 500 MG PO CAPS
500.0000 mg | ORAL_CAPSULE | Freq: Three times a day (TID) | ORAL | 0 refills | Status: AC
  Filled 2024-07-12 (×2): qty 21, 7d supply, fill #0

## 2024-07-12 NOTE — ED Provider Notes (Signed)
 "  [facility] Provider Note    Event Date/Time   First MD Initiated Contact with Patient 07/12/24 1010     (approximate)   History   Ankle Pain and Wound Infection   HPI  Jeremy [name] is a 69 y.o. male with HF, diabetes, hypertension who comes in with concerns for ankle pain.  Patient has a history of chronic pain of the right ankle.  I see where he was seen on 02/27/2024.  Podiatry has recommended conservative treatment.  He was seen again on 07/09/2024 for ankle pain with x-rays that were reassuring and thought to be secondary to chronic pain.  When I initially went to the room patient was requesting a coffee and sandwich.  When I then asked him why he was here he was initially telling me that where he is living I did not provide him any food and that he was hungry.  He then did bring up the concern for an upper on his right ankle.  He reports that has been there for weeks but has noticed some increasing discharge out of it for the past few days.  No fevers.  He denies any new falls.   Physical Exam   Triage Vital Signs: ED Triage Vitals [07/12/24 0912]  Encounter Vitals Group     BP (!) 147/96     Girls Systolic BP Percentile      Girls Diastolic BP Percentile      Boys Systolic BP Percentile      Boys Diastolic BP Percentile      Pulse Rate 100     Resp 18     Temp 97.6 F (36.4 C)     Temp src      SpO2 100 %     Weight 200 lb (90.7 kg)     Height 5' 11 (1.803 m)     Head Circumference      Peak Flow      Pain Score 3     Pain Loc      Pain Education      Exclude from Growth Chart     Most recent vital signs: Vitals:   07/12/24 0912  BP: (!) 147/96  Pulse: 100  Resp: 18  Temp: 97.6 F (36.4 C)  SpO2: 100%     General: Awake, no distress.  CV:  Good peripheral perfusion.  Resp:  Normal effort.  Abd:  No distention.  Other:  See media tab patient has dime sized abrasion noted on the lateral aspect.  No surrounding erythema for  range of motion of the ankle.  Good distal pulse.   ED Results / Procedures / Treatments   Labs (all labs ordered are listed, but only abnormal results are displayed) Labs Reviewed  CBC WITH DIFFERENTIAL/PLATELET  BASIC METABOLIC PANEL WITH GFR  SEDIMENTATION RATE  C-REACTIVE PROTEIN  CBG MONITORING, ED      RADIOLOGY I have reviewed the xray personally and interpreted no evidence of any acute fracture   PROCEDURES:  Critical Care performed: No  Procedures   MEDICATIONS ORDERED IN ED: Medications - No data to display   IMPRESSION / MDM / ASSESSMENT AND PLAN / ED COURSE  I reviewed the triage vital signs and the nursing notes.   Patient's presentation is most consistent with acute presentation with potential threat to life or bodily function.   Patient comes in with chronic pain of his right ankle with a worsening wound on the lateral aspect.  Concern  for some drainage out of it but no active drainage at this time.  Discussed with podiatry Dr. Lennie we did want a CT scan to help with surgical planning outpatient as well as blood work to evaluate for evidence of acute infection.  If this is reassuring could be reasonable for patient to go home.  He has got no evidence of septic joint on examination good distal pulse.  2:13 PM D.w podiatry Dr lennie  Labs look fine as far as WBC, CT looks stable along with x-ray. You can let him go. His ankle is essentially fused together.  Sed rate only slightly elevated and similar to 8 months ago.  BMP shows creatinine around baseline.  Updated patient on plan and feels comfortable with discharge home   The patient is on the cardiac monitor to evaluate for evidence of arrhythmia and/or significant heart rate changes.      FINAL CLINICAL IMPRESSION(S) / ED DIAGNOSES   Final diagnoses:  Wound of right ankle, initial encounter     Rx / DC Orders   ED Discharge Orders          Ordered    cephALEXin  (KEFLEX ) 500 MG capsule   3 times daily        07/12/24 1412    doxycycline  (VIBRA -TABS) 100 MG tablet  2 times daily        07/12/24 1412             Note:  This document was prepared using Dragon voice recognition software and may include unintentional dictation errors.   Ernest Ronal BRAVO, MD 07/12/24 1435  "

## 2024-07-12 NOTE — ED Notes (Signed)
 Called lab to have them collect blood.

## 2024-07-12 NOTE — ED Notes (Signed)
 Pt in bed, pt requests a dressing for his ankle, dressing placed, pt states that he would like a sandwich, states that he has been here for 8 hours and can't understand why no one has brought him a sandwich.  Read and reviewed d/c instructions and follow up, pt asked where the podiatrist's office was, reviewed address with pt three times, pt ambulatory from department with a cane.

## 2024-07-12 NOTE — ED Triage Notes (Signed)
 Pt comes with right ankle pain. Pt states wound to side of foot. Pt states this has been there for few days. Pt states discharge from it. Pt is diabetic.

## 2024-07-12 NOTE — Discharge Instructions (Addendum)
 We are starting you on some antibiotics out of precaution given he reports some discharge from the wound but your CT imaging was reviewed by Dr. Lennie and he can follow-up with him outpatient to discuss this wound further.  Return for fevers, worsening symptoms or other concerns   Narrative & Impression  EXAM: CT RIGHT ANKLE, WITHOUT IV CONTRAST 07/12/2024 12:15:08 PM   TECHNIQUE: Axial images were acquired through the right ankle without IV contrast. Reformatted images were reviewed. Automated exposure control, iterative reconstruction, and/or weight based adjustment of the mA/kV was utilized to reduce the radiation dose to as low as reasonably achievable.   COMPARISON: Comparison is made to study dated 10/28/2023.   CLINICAL HISTORY: Purulent wound along the side of the foot.   FINDINGS:   BONES: Nonunion of distal fibular fracture with substantial sclerosis in the distal fibula, chronic infection not excluded. Lucency around the plate and screw fixator especially along the proximal fragment, suggesting loosening or infection. Lucency around portions of the long cancellous tibiofibular screw along the distal fibula and centrally in the distal tibial metaphysis similar appearance. Chronic distal fusion of the fibula, tibia, and talus. Plantar and Achilles calcaneal spurs. Stable dorsal spurring at the talonavicular articulation. A portion of the lowest screw in the fibular plate and screw fixator is not embedded within bone. No new bony destructive findings to indicate progressive osteomyelitis.   JOINTS: No dislocation. Chronic distal fusion of the fibula, tibia, and talus. Stable dorsal spurring at the talonavicular articulation.   SOFT TISSUES: Subcutaneous edema overlies both malleoli, especially laterally, and appears worse laterally compared to 10/28/2023. Mild cutaneous irregularity laterally overlying the lateral malleolus just above the level of the main  transverse tibiofibular screw. Flexor hallucis longus tenosynovitis. Thickened medial band of the plantar fascia, cannot exclude plantar fasciitis. 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   IMPRESSION: 1. 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 2. No bony destructive findings to indicate progressive osteomyelitis. 3. Subcutaneous edema overlying both malleoli, greatest laterally, with mild cutaneous irregularity laterally overlying the lateral malleolus just above the level of the main transverse tibiofibular screw potentially from ulceration. 4. Chronic distal fusion of the fibula, tibia, and talus. 5. Flexor hallucis longus tenosynovitis. 6. Thickened medial band of the plantar fascia, with plantar fasciitis not excluded. 7. 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 8. Plantar and Achilles calcaneal spurs, with dorsal spurring at the talonavicular articulation.

## 2024-07-12 NOTE — ED Notes (Signed)
 Pt demanding food and water  because they are getting a migraine. When pt was given crackers and water  pt started yelling at this RN telling this RN that that ain't no food! When it was explained to the pt that we do not have sandwich trays on this side of the ED pt stated well you aren't doing anything anyway, you can go get me food. The CT tech took the water  and crackers to the pt and pt accepted them.

## 2024-07-12 NOTE — ED Notes (Signed)
 Lab called for specimen draw after two nurses were unable to obtain labs.

## 2024-07-13 NOTE — Congregational Nurse Program (Signed)
" °  Dept: [phone]   Congregational Nurse Program Note  Date of Encounter: 07/13/2024  Past Medical History: Past Medical History:  Diagnosis Date   Atrial fibrillation, chronic (HCC)    CHF (congestive heart failure) (HCC)    Coronary artery disease    Diabetes mellitus without complication (HCC)    Dyslipidemia 08/07/2023   GERD without esophagitis 08/07/2023   Hypertension    OSA (obstructive sleep apnea) 11/24/2022   S/P CABG x 1     Encounter Details:  Community Questionnaire - 07/13/24 1300       Questionnaire   Ask client: Do you give verbal consent for me to treat you today? Yes    Student Assistance N/A    Location Patient Served  [facility]    Encounter Setting CN site    Population Status Unhoused    Insurance Medicaid;Medicare    Insurance/Financial Assistance Referral N/A    Medication Have Medication Insecurities;Patient Medications Reviewed;CCNP Provided Medication Assistance    Medical Provider Yes    Medical Referrals Made Non-[facility] PCP/Clinic    Medical Appointment Completed Non-[facility] PCP/Clinic;ED    Screenings CN Performed (remember to also record results) NA    CN Interventions Diabetes Education;Hypertension Education;Other Education;Advocate/Support;Navigate Healthcare System;Case Management;Health Counseling    ED Visit Averted N/A        RN visited client at [facility] to ensure client had received prescribed medications and understood proper administration. RN had delivered medications to [facility] the previous evening; however, the client was not present at that time. RN was able to meet with the client today. Client stated he received the medications but had not yet started taking them. RN provided education regarding the importance of wound care and antibiotic compliance, particularly given the clients history of diabetes and hypertension. RN explained how these chronic conditions can negatively impact wound healing if not properly managed. Client  verbalized understanding. Client denied any questions regarding medications at this time.    "

## 2024-07-16 NOTE — Congregational Nurse Program (Signed)
" °  Dept: [phone]   Congregational Nurse Program Note  Date of Encounter: 07/16/2024  Past Medical History: Past Medical History:  Diagnosis Date   Atrial fibrillation, chronic (HCC)    CHF (congestive heart failure) (HCC)    Coronary artery disease    Diabetes mellitus without complication (HCC)    Dyslipidemia 08/07/2023   GERD without esophagitis 08/07/2023   Hypertension    OSA (obstructive sleep apnea) 11/24/2022   S/P CABG x 1     Encounter Details:  Community Questionnaire - 07/16/24 1010       Questionnaire   Ask client: Do you give verbal consent for me to treat you today? Yes    Student Assistance N/A    Location Patient Served  [facility]    Encounter Setting CN site    Population Status Unhoused    Insurance Medicaid;Medicare    Insurance/Financial Assistance Referral N/A    Medication Have Medication Insecurities;Patient Medications Reviewed;CCNP Provided Medication Assistance    Medical Provider Yes    Medical Referrals Made Dental    Medical Appointment Completed Non-[facility] PCP/Clinic    Screenings CN Performed (remember to also record results) NA    CN Interventions Diabetes Education;Hypertension Education;Other Education;Advocate/Support;Navigate Healthcare System;Case Management;Health Counseling    ED Visit Averted N/A         RN saw client today in his pallet. RN able to refill client's med container and confirmed client is taking his ABX as prescribed. Some improvement noted to abrasion on ankle. Client denies any increase in pain. Client given written and oral reminder for upcoming dental appointment on Thursday. Client to be picked up at 9:25 for appointment. Client has no other acute needs at this time. RN able to provide spiritual and emotional support.   "

## 2024-07-17 ENCOUNTER — Encounter: Admitting: Physician Assistant
